# Patient Record
Sex: Female | Born: 1945 | Race: Black or African American | Hispanic: No | Smoking: Never smoker
Health system: Southern US, Community
[De-identification: ages and names within clinical notes are randomized; demographics above are authoritative.]

## PROBLEM LIST (undated history)

## (undated) DIAGNOSIS — Z8739 Personal history of other diseases of the musculoskeletal system and connective tissue: Secondary | ICD-10-CM

## (undated) DIAGNOSIS — H269 Unspecified cataract: Secondary | ICD-10-CM

## (undated) DIAGNOSIS — Z5189 Encounter for other specified aftercare: Secondary | ICD-10-CM

## (undated) DIAGNOSIS — A498 Other bacterial infections of unspecified site: Secondary | ICD-10-CM

## (undated) DIAGNOSIS — R1030 Lower abdominal pain, unspecified: Secondary | ICD-10-CM

## (undated) DIAGNOSIS — I639 Cerebral infarction, unspecified: Secondary | ICD-10-CM

## (undated) DIAGNOSIS — M47818 Spondylosis without myelopathy or radiculopathy, sacral and sacrococcygeal region: Secondary | ICD-10-CM

## (undated) DIAGNOSIS — M76899 Other specified enthesopathies of unspecified lower limb, excluding foot: Secondary | ICD-10-CM

## (undated) DIAGNOSIS — M109 Gout, unspecified: Secondary | ICD-10-CM

## (undated) DIAGNOSIS — E785 Hyperlipidemia, unspecified: Secondary | ICD-10-CM

## (undated) DIAGNOSIS — R351 Nocturia: Secondary | ICD-10-CM

## (undated) DIAGNOSIS — B009 Herpesviral infection, unspecified: Secondary | ICD-10-CM

## (undated) DIAGNOSIS — N183 Chronic kidney disease, stage 3 unspecified: Secondary | ICD-10-CM

## (undated) DIAGNOSIS — I10 Essential (primary) hypertension: Secondary | ICD-10-CM

## (undated) DIAGNOSIS — D649 Anemia, unspecified: Secondary | ICD-10-CM

## (undated) DIAGNOSIS — M1990 Unspecified osteoarthritis, unspecified site: Secondary | ICD-10-CM

## (undated) DIAGNOSIS — E119 Type 2 diabetes mellitus without complications: Secondary | ICD-10-CM

## (undated) DIAGNOSIS — Z8489 Family history of other specified conditions: Secondary | ICD-10-CM

## (undated) DIAGNOSIS — R011 Cardiac murmur, unspecified: Secondary | ICD-10-CM

## (undated) HISTORY — DX: Essential (primary) hypertension: I10

## (undated) HISTORY — DX: Other bacterial infections of unspecified site: A49.8

## (undated) HISTORY — DX: Encounter for other specified aftercare: Z51.89

## (undated) HISTORY — DX: Hyperlipidemia, unspecified: E78.5

## (undated) HISTORY — DX: Anemia, unspecified: D64.9

## (undated) HISTORY — DX: Personal history of other diseases of the musculoskeletal system and connective tissue: Z87.39

## (undated) HISTORY — PX: TUBAL LIGATION: SHX77

## (undated) HISTORY — DX: Cardiac murmur, unspecified: R01.1

## (undated) HISTORY — DX: Cerebral infarction, unspecified: I63.9

## (undated) HISTORY — DX: Unspecified cataract: H26.9

## (undated) HISTORY — DX: Unspecified osteoarthritis, unspecified site: M19.90

---
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
## 1981-12-08 HISTORY — PX: CARPAL TUNNEL RELEASE: SHX101

## 1981-12-08 HISTORY — PX: ABDOMINAL HYSTERECTOMY: SHX81

## 2002-05-30 ENCOUNTER — Encounter: Payer: Self-pay | Admitting: Internal Medicine

## 2004-12-08 DIAGNOSIS — I639 Cerebral infarction, unspecified: Secondary | ICD-10-CM

## 2004-12-08 HISTORY — DX: Cerebral infarction, unspecified: I63.9

## 2005-05-26 ENCOUNTER — Ambulatory Visit: Payer: Self-pay | Admitting: Internal Medicine

## 2005-06-06 ENCOUNTER — Ambulatory Visit: Payer: Self-pay | Admitting: Internal Medicine

## 2005-07-16 ENCOUNTER — Encounter: Admission: RE | Admit: 2005-07-16 | Discharge: 2005-07-16 | Payer: Self-pay | Admitting: Specialist

## 2009-04-03 DIAGNOSIS — D126 Benign neoplasm of colon, unspecified: Secondary | ICD-10-CM | POA: Insufficient documentation

## 2009-04-04 ENCOUNTER — Ambulatory Visit: Payer: Self-pay | Admitting: Internal Medicine

## 2009-04-04 DIAGNOSIS — R143 Flatulence: Secondary | ICD-10-CM

## 2009-04-04 DIAGNOSIS — R198 Other specified symptoms and signs involving the digestive system and abdomen: Secondary | ICD-10-CM | POA: Insufficient documentation

## 2009-04-04 DIAGNOSIS — R141 Gas pain: Secondary | ICD-10-CM | POA: Insufficient documentation

## 2009-04-04 DIAGNOSIS — R142 Eructation: Secondary | ICD-10-CM

## 2009-04-04 DIAGNOSIS — K5900 Constipation, unspecified: Secondary | ICD-10-CM | POA: Insufficient documentation

## 2009-04-11 ENCOUNTER — Encounter: Payer: Self-pay | Admitting: Internal Medicine

## 2009-04-11 ENCOUNTER — Ambulatory Visit: Payer: Self-pay | Admitting: Internal Medicine

## 2009-04-13 ENCOUNTER — Encounter: Payer: Self-pay | Admitting: Internal Medicine

## 2009-06-07 ENCOUNTER — Ambulatory Visit (HOSPITAL_COMMUNITY): Admission: RE | Admit: 2009-06-07 | Discharge: 2009-06-07 | Payer: Self-pay | Admitting: Cardiology

## 2009-08-20 ENCOUNTER — Encounter: Admission: RE | Admit: 2009-08-20 | Discharge: 2009-08-20 | Payer: Self-pay | Admitting: Otolaryngology

## 2010-02-01 ENCOUNTER — Encounter (HOSPITAL_COMMUNITY): Admission: RE | Admit: 2010-02-01 | Discharge: 2010-04-09 | Payer: Self-pay | Admitting: Cardiology

## 2010-02-19 ENCOUNTER — Encounter (INDEPENDENT_AMBULATORY_CARE_PROVIDER_SITE_OTHER): Payer: Self-pay | Admitting: Cardiology

## 2010-02-19 ENCOUNTER — Ambulatory Visit: Admission: RE | Admit: 2010-02-19 | Discharge: 2010-02-19 | Payer: Self-pay | Admitting: Cardiology

## 2010-02-19 ENCOUNTER — Ambulatory Visit (HOSPITAL_COMMUNITY): Admission: RE | Admit: 2010-02-19 | Discharge: 2010-02-19 | Payer: Self-pay | Admitting: Cardiology

## 2010-05-03 ENCOUNTER — Encounter: Admission: RE | Admit: 2010-05-03 | Discharge: 2010-05-03 | Payer: Self-pay | Admitting: Orthopaedic Surgery

## 2010-05-17 ENCOUNTER — Inpatient Hospital Stay (HOSPITAL_COMMUNITY): Admission: EM | Admit: 2010-05-17 | Discharge: 2010-05-23 | Payer: Self-pay | Admitting: Cardiovascular Disease

## 2010-05-17 ENCOUNTER — Ambulatory Visit (HOSPITAL_COMMUNITY): Admission: RE | Admit: 2010-05-17 | Discharge: 2010-05-17 | Payer: Self-pay | Admitting: Cardiology

## 2010-05-19 ENCOUNTER — Encounter (INDEPENDENT_AMBULATORY_CARE_PROVIDER_SITE_OTHER): Payer: Self-pay | Admitting: Cardiology

## 2010-05-21 ENCOUNTER — Ambulatory Visit: Payer: Self-pay | Admitting: Hematology and Oncology

## 2010-05-23 ENCOUNTER — Encounter (INDEPENDENT_AMBULATORY_CARE_PROVIDER_SITE_OTHER): Payer: Self-pay | Admitting: Cardiology

## 2010-05-23 ENCOUNTER — Ambulatory Visit: Payer: Self-pay | Admitting: Hematology and Oncology

## 2010-08-26 ENCOUNTER — Ambulatory Visit: Payer: Self-pay | Admitting: Hematology and Oncology

## 2010-09-11 LAB — BASIC METABOLIC PANEL
BUN: 25 mg/dL — ABNORMAL HIGH (ref 6–23)
Chloride: 100 mEq/L (ref 96–112)
Potassium: 4.3 mEq/L (ref 3.5–5.3)
Sodium: 136 mEq/L (ref 135–145)

## 2010-09-11 LAB — FERRITIN: Ferritin: 15 ng/mL (ref 10–291)

## 2010-09-11 LAB — CBC WITH DIFFERENTIAL/PLATELET
Eosinophils Absolute: 0.2 10*3/uL (ref 0.0–0.5)
MCH: 22.4 pg — ABNORMAL LOW (ref 25.1–34.0)
MCV: 69.9 fL — ABNORMAL LOW (ref 79.5–101.0)
MONO#: 0.6 10*3/uL (ref 0.1–0.9)
MONO%: 6.7 % (ref 0.0–14.0)
NEUT#: 6.8 10*3/uL — ABNORMAL HIGH (ref 1.5–6.5)
Platelets: 373 10*3/uL (ref 145–400)
RBC: 4.73 10*6/uL (ref 3.70–5.45)

## 2010-12-08 DIAGNOSIS — Z5189 Encounter for other specified aftercare: Secondary | ICD-10-CM

## 2010-12-08 HISTORY — DX: Encounter for other specified aftercare: Z51.89

## 2010-12-08 HISTORY — PX: OTHER SURGICAL HISTORY: SHX169

## 2010-12-23 ENCOUNTER — Ambulatory Visit: Payer: Self-pay | Admitting: Hematology and Oncology

## 2010-12-25 LAB — BASIC METABOLIC PANEL
BUN: 18 mg/dL (ref 6–23)
CO2: 25 mEq/L (ref 19–32)
Calcium: 9.5 mg/dL (ref 8.4–10.5)
Chloride: 103 mEq/L (ref 96–112)
Creatinine, Ser: 0.91 mg/dL (ref 0.40–1.20)
Glucose, Bld: 107 mg/dL — ABNORMAL HIGH (ref 70–99)
Potassium: 4.7 mEq/L (ref 3.5–5.3)
Sodium: 138 mEq/L (ref 135–145)

## 2010-12-25 LAB — CBC WITH DIFFERENTIAL/PLATELET
BASO%: 0.8 % (ref 0.0–2.0)
Basophils Absolute: 0.1 10*3/uL (ref 0.0–0.1)
EOS%: 2.1 % (ref 0.0–7.0)
Eosinophils Absolute: 0.2 10*3/uL (ref 0.0–0.5)
HCT: 36.1 % (ref 34.8–46.6)
HGB: 11.3 g/dL — ABNORMAL LOW (ref 11.6–15.9)
LYMPH%: 13.1 % — ABNORMAL LOW (ref 14.0–49.7)
MCH: 23.2 pg — ABNORMAL LOW (ref 25.1–34.0)
MCHC: 31.3 g/dL — ABNORMAL LOW (ref 31.5–36.0)
MCV: 74.1 fL — ABNORMAL LOW (ref 79.5–101.0)
MONO#: 0.5 10*3/uL (ref 0.1–0.9)
MONO%: 6.6 % (ref 0.0–14.0)
NEUT#: 6.2 10*3/uL (ref 1.5–6.5)
NEUT%: 77.4 % — ABNORMAL HIGH (ref 38.4–76.8)
Platelets: 336 10*3/uL (ref 145–400)
RBC: 4.87 10*6/uL (ref 3.70–5.45)
RDW: 18.2 % — ABNORMAL HIGH (ref 11.2–14.5)
WBC: 8 10*3/uL (ref 3.9–10.3)
lymph#: 1.1 10*3/uL (ref 0.9–3.3)

## 2010-12-25 LAB — IRON AND TIBC
%SAT: 11 % — ABNORMAL LOW (ref 20–55)
Iron: 37 ug/dL — ABNORMAL LOW (ref 42–145)
TIBC: 344 ug/dL (ref 250–470)
UIBC: 307 ug/dL

## 2010-12-25 LAB — FERRITIN: Ferritin: 17 ng/mL (ref 10–291)

## 2010-12-29 ENCOUNTER — Encounter: Payer: Self-pay | Admitting: Specialist

## 2011-02-23 LAB — DIFFERENTIAL
Band Neutrophils: 0 % (ref 0–10)
Basophils Relative: 1 % (ref 0–1)
Eosinophils Relative: 2 % (ref 0–5)
Lymphocytes Relative: 9 % — ABNORMAL LOW (ref 12–46)
Lymphs Abs: 0.8 10*3/uL (ref 0.7–4.0)
Monocytes Absolute: 0.3 10*3/uL (ref 0.1–1.0)
Myelocytes: 0 %
Neutro Abs: 7.1 10*3/uL (ref 1.7–7.7)
Neutrophils Relative %: 84 % — ABNORMAL HIGH (ref 43–77)
Promyelocytes Absolute: 0 %

## 2011-02-23 LAB — CBC
HCT: 26.4 % — ABNORMAL LOW (ref 36.0–46.0)
Hemoglobin: 8.3 g/dL — ABNORMAL LOW (ref 12.0–15.0)
Platelets: 288 10*3/uL (ref 150–400)
RDW: 34.1 % — ABNORMAL HIGH (ref 11.5–15.5)
WBC: 8.5 10*3/uL (ref 4.0–10.5)

## 2011-02-23 LAB — PROTEIN ELECTROPH W RFLX QUANT IMMUNOGLOBULINS
Beta 2: 5.6 % (ref 3.2–6.5)
Beta Globulin: 6.1 % (ref 4.7–7.2)
Gamma Globulin: 16.3 % (ref 11.1–18.8)

## 2011-02-23 LAB — HAPTOGLOBIN: Haptoglobin: 293 mg/dL — ABNORMAL HIGH (ref 16–200)

## 2011-02-23 LAB — HEMOGLOBINOPATHY EVALUATION: Hgb S Quant: 0 % (ref 0.0–0.0)

## 2011-02-23 LAB — LACTATE DEHYDROGENASE: LDH: 159 U/L (ref 94–250)

## 2011-02-23 LAB — APTT: aPTT: 35 seconds (ref 24–37)

## 2011-02-23 LAB — IMMUNOFIXATION ELECTROPHORESIS
IgA: 147 mg/dL (ref 68–378)
IgG (Immunoglobin G), Serum: 1080 mg/dL (ref 694–1618)

## 2011-02-23 LAB — IGG, IGA, IGM: IgM, Serum: 53 mg/dL — ABNORMAL LOW (ref 60–263)

## 2011-02-23 LAB — BONE MARROW EXAM

## 2011-02-23 LAB — CHROMOSOME ANALYSIS, BONE MARROW

## 2011-02-24 LAB — BASIC METABOLIC PANEL
BUN: 12 mg/dL (ref 6–23)
BUN: 17 mg/dL (ref 6–23)
CO2: 28 mEq/L (ref 19–32)
Calcium: 8.8 mg/dL (ref 8.4–10.5)
Calcium: 8.9 mg/dL (ref 8.4–10.5)
Creatinine, Ser: 0.97 mg/dL (ref 0.4–1.2)
Creatinine, Ser: 0.99 mg/dL (ref 0.4–1.2)
Creatinine, Ser: 1 mg/dL (ref 0.4–1.2)
GFR calc Af Amer: >60 mL/min (ref >60)
GFR calc Af Amer: >60 mL/min (ref >60)
GFR calc non Af Amer: 55 mL/min — ABNORMAL LOW (ref >60)
GFR calc non Af Amer: 56 mL/min — ABNORMAL LOW (ref >60)
GFR calc non Af Amer: 57 mL/min — ABNORMAL LOW (ref >60)
Glucose, Bld: 120 mg/dL — ABNORMAL HIGH (ref 70–99)
Sodium: 135 mEq/L (ref 135–145)
Sodium: 138 mEq/L (ref 135–145)

## 2011-02-24 LAB — CBC
HCT: 28.5 % — ABNORMAL LOW (ref 36.0–46.0)
Hemoglobin: 7.4 g/dL — ABNORMAL LOW (ref 12.0–15.0)
Hemoglobin: 8.6 g/dL — ABNORMAL LOW (ref 12.0–15.0)
MCHC: 29.9 g/dL — ABNORMAL LOW (ref 30.0–36.0)
MCHC: 30.5 g/dL (ref 30.0–36.0)
MCV: 55.8 fL — ABNORMAL LOW (ref 78.0–100.0)
MCV: 61.7 fL — ABNORMAL LOW (ref 78.0–100.0)
MCV: 61.8 fL — ABNORMAL LOW (ref 78.0–100.0)
Platelets: 321 10*3/uL (ref 150–400)
Platelets: 333 10*3/uL (ref 150–400)
Platelets: 347 10*3/uL (ref 150–400)
Platelets: 377 10*3/uL (ref 150–400)
RBC: 4.04 MIL/uL (ref 3.87–5.11)
RBC: 4.46 MIL/uL (ref 3.87–5.11)
RBC: 5 MIL/uL (ref 3.87–5.11)
RDW: 24.3 % — ABNORMAL HIGH (ref 11.5–15.5)
RDW: 24.8 % — ABNORMAL HIGH (ref 11.5–15.5)
RDW: 33.3 % — ABNORMAL HIGH (ref 11.5–15.5)
WBC: 8.1 10*3/uL (ref 4.0–10.5)
WBC: 8.3 10*3/uL (ref 4.0–10.5)
WBC: 8.7 10*3/uL (ref 4.0–10.5)

## 2011-02-24 LAB — CROSSMATCH: ABO/RH(D): O POS

## 2011-02-24 LAB — HEMOCCULT GUIAC POC 1CARD (OFFICE): Fecal Occult Bld: NEGATIVE

## 2011-02-24 LAB — SEDIMENTATION RATE: Sed Rate: 37 mm/hr — ABNORMAL HIGH (ref 0–22)

## 2011-02-24 LAB — ANA: Anti Nuclear Antibody(ANA): POSITIVE — AB

## 2011-02-24 LAB — IRON AND TIBC
Iron: 242 ug/dL — ABNORMAL HIGH (ref 42–135)
TIBC: 392 ug/dL (ref 250–470)
UIBC: 150 ug/dL

## 2011-02-24 LAB — ANTI-NUCLEAR AB-TITER (ANA TITER): ANA Titer 1: 1:80 {titer} — ABNORMAL HIGH

## 2011-02-24 LAB — RETICULOCYTES: Retic Ct Pct: 0.8 % (ref 0.4–3.1)

## 2011-03-17 LAB — COMPREHENSIVE METABOLIC PANEL
Albumin: 4 g/dL (ref 3.5–5.2)
BUN: 19 mg/dL (ref 6–23)
Calcium: 9.2 mg/dL (ref 8.4–10.5)
Chloride: 103 mEq/L (ref 96–112)
Creatinine, Ser: 0.97 mg/dL (ref 0.4–1.2)
Total Bilirubin: 1 mg/dL (ref 0.3–1.2)

## 2011-03-17 LAB — CBC
Platelets: 349 10*3/uL (ref 150–400)
WBC: 7.7 10*3/uL (ref 4.0–10.5)

## 2011-03-17 LAB — BRAIN NATRIURETIC PEPTIDE: Pro B Natriuretic peptide (BNP): 55 pg/mL (ref 0.0–100.0)

## 2011-03-17 LAB — LIPID PANEL
HDL: 51 mg/dL (ref >39)
Triglycerides: 73 mg/dL (ref <150)
VLDL: 15 mg/dL (ref 0–40)

## 2011-03-17 LAB — URINALYSIS, MICROSCOPIC ONLY
Ketones, ur: NEGATIVE mg/dL
Nitrite: NEGATIVE
Protein, ur: NEGATIVE mg/dL
pH: 7 (ref 5.0–8.0)

## 2011-03-17 LAB — TSH: TSH: 0.881 u[IU]/mL (ref 0.350–4.500)

## 2011-06-05 ENCOUNTER — Other Ambulatory Visit: Payer: Self-pay | Admitting: Hematology and Oncology

## 2011-06-05 ENCOUNTER — Encounter (HOSPITAL_BASED_OUTPATIENT_CLINIC_OR_DEPARTMENT_OTHER): Payer: Medicare Other | Admitting: Hematology and Oncology

## 2011-06-05 DIAGNOSIS — D638 Anemia in other chronic diseases classified elsewhere: Secondary | ICD-10-CM

## 2011-06-05 DIAGNOSIS — R5383 Other fatigue: Secondary | ICD-10-CM

## 2011-06-05 DIAGNOSIS — I10 Essential (primary) hypertension: Secondary | ICD-10-CM

## 2011-06-05 DIAGNOSIS — M25569 Pain in unspecified knee: Secondary | ICD-10-CM

## 2011-06-05 DIAGNOSIS — R700 Elevated erythrocyte sedimentation rate: Secondary | ICD-10-CM

## 2011-06-05 LAB — CBC WITH DIFFERENTIAL/PLATELET
BASO%: 0.5 % (ref 0.0–2.0)
Basophils Absolute: 0 10*3/uL (ref 0.0–0.1)
HCT: 38.3 % (ref 34.8–46.6)
HGB: 12.4 g/dL (ref 11.6–15.9)
MONO#: 0.5 10*3/uL (ref 0.1–0.9)
NEUT%: 74.8 % (ref 38.4–76.8)
WBC: 7.5 10*3/uL (ref 3.9–10.3)
lymph#: 1.1 10*3/uL (ref 0.9–3.3)

## 2011-06-05 LAB — BASIC METABOLIC PANEL
CO2: 24 mEq/L (ref 19–32)
Calcium: 9.4 mg/dL (ref 8.4–10.5)
Chloride: 106 mEq/L (ref 96–112)
Creatinine, Ser: 1 mg/dL (ref 0.50–1.10)
Glucose, Bld: 122 mg/dL — ABNORMAL HIGH (ref 70–99)
Sodium: 140 mEq/L (ref 135–145)

## 2011-06-05 LAB — IRON AND TIBC: %SAT: 13 % — ABNORMAL LOW (ref 20–55)

## 2011-06-10 ENCOUNTER — Encounter (HOSPITAL_BASED_OUTPATIENT_CLINIC_OR_DEPARTMENT_OTHER): Payer: Medicare Other | Admitting: Hematology and Oncology

## 2011-06-10 DIAGNOSIS — D638 Anemia in other chronic diseases classified elsewhere: Secondary | ICD-10-CM

## 2011-06-10 DIAGNOSIS — D649 Anemia, unspecified: Secondary | ICD-10-CM

## 2011-11-29 ENCOUNTER — Telehealth: Payer: Self-pay | Admitting: Hematology and Oncology

## 2011-11-29 NOTE — Telephone Encounter (Signed)
lmonvm adviisng the pt of her r/s appts due to the electronic medical records system

## 2011-12-16 ENCOUNTER — Other Ambulatory Visit: Payer: Medicare Other | Admitting: Lab

## 2011-12-24 ENCOUNTER — Other Ambulatory Visit (HOSPITAL_BASED_OUTPATIENT_CLINIC_OR_DEPARTMENT_OTHER): Payer: Medicare Other | Admitting: Lab

## 2011-12-24 DIAGNOSIS — R700 Elevated erythrocyte sedimentation rate: Secondary | ICD-10-CM

## 2011-12-24 DIAGNOSIS — R5381 Other malaise: Secondary | ICD-10-CM

## 2011-12-24 DIAGNOSIS — D638 Anemia in other chronic diseases classified elsewhere: Secondary | ICD-10-CM

## 2011-12-24 DIAGNOSIS — M25569 Pain in unspecified knee: Secondary | ICD-10-CM

## 2011-12-24 LAB — IRON AND TIBC
%SAT: 13 % — ABNORMAL LOW (ref 20–55)
Iron: 47 ug/dL (ref 42–145)
UIBC: 326 ug/dL (ref 125–400)

## 2011-12-24 LAB — BASIC METABOLIC PANEL
Calcium: 9.5 mg/dL (ref 8.4–10.5)
Potassium: 3.9 mEq/L (ref 3.5–5.3)
Sodium: 138 mEq/L (ref 135–145)

## 2011-12-24 LAB — CBC WITH DIFFERENTIAL/PLATELET
BASO%: 0.6 % (ref 0.0–2.0)
Basophils Absolute: 0.1 10*3/uL (ref 0.0–0.1)
EOS%: 1.9 % (ref 0.0–7.0)
Eosinophils Absolute: 0.2 10*3/uL (ref 0.0–0.5)
HGB: 12.8 g/dL (ref 11.6–15.9)
MCV: 74.5 fL — ABNORMAL LOW (ref 79.5–101.0)
MONO#: 0.5 10*3/uL (ref 0.1–0.9)
NEUT#: 5 10*3/uL (ref 1.5–6.5)
RDW: 17.1 % — ABNORMAL HIGH (ref 11.2–14.5)
WBC: 7.8 10*3/uL (ref 3.9–10.3)

## 2011-12-24 LAB — FERRITIN: Ferritin: 17 ng/mL (ref 10–291)

## 2011-12-29 ENCOUNTER — Encounter: Payer: Self-pay | Admitting: *Deleted

## 2011-12-30 ENCOUNTER — Ambulatory Visit (HOSPITAL_BASED_OUTPATIENT_CLINIC_OR_DEPARTMENT_OTHER): Payer: Medicare Other | Admitting: Hematology and Oncology

## 2011-12-30 ENCOUNTER — Telehealth: Payer: Self-pay | Admitting: Hematology and Oncology

## 2011-12-30 VITALS — BP 214/108 | HR 76 | Temp 97.0°F | Ht 64.0 in | Wt 228.9 lb

## 2011-12-30 DIAGNOSIS — D509 Iron deficiency anemia, unspecified: Secondary | ICD-10-CM

## 2011-12-30 DIAGNOSIS — D539 Nutritional anemia, unspecified: Secondary | ICD-10-CM

## 2011-12-30 DIAGNOSIS — D638 Anemia in other chronic diseases classified elsewhere: Secondary | ICD-10-CM

## 2011-12-30 NOTE — Telephone Encounter (Signed)
appt made for 2/6 iron,09/17/12 lab and 09/22/12 md,aware and printed  aom

## 2011-12-30 NOTE — Progress Notes (Signed)
This office note has been dictated.

## 2011-12-30 NOTE — Progress Notes (Signed)
CC:   Beth [name]. Spruill, M.D. Eduardo Osier. Sharyn Lull, M.D. Erasmo Downer, MD  IDENTIFYING STATEMENT:  Patient is a 66 year old woman with history of iron-deficiency anemia, anemia of chronic disease who presents for followup.  INTERVAL HISTORY:  The patient reports having had profound epistaxis this fall.  She was admitted to [facility].  She tells me she had blood clots in her nostril which required evacuation.  In addition, she also received IV iron.  She continues on oral iron.  Does not seem to constipate her.  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  MEDICATIONS:  Nu-Iron 150 mg daily.  Other medications reviewed and updated.  ALLERGIES:  None.  PHYSICAL EXAM:  Well-appearing, well-nourished woman in no distress. Vitals:  Pulse 76, blood pressure 170/85, temperature 97, respirations 20, weight 228.9 pounds.  HEENT:  Head is atraumatic, normocephalic.  Sclerae anicteric.  Mouth moist.  Chest/CVS: Unremarkable.  Abdomen:  Soft.  Extremities:  No edema.  LAB DATA:  On 12/24/2011, white cell count 7.8, hemoglobin 12.8, hematocrit 40.6, platelets 203, iron 47, TIBC 373, saturation 13% (15%), ferritin 17 (32).  Sodium 138, potassium 3.9, chloride 100, CO2 28, BUN 23, creatinine 1.06, glucose 140, calcium 9.5.  IMPRESSION AND PLAN:  Beth [name] is a 66 year old woman with iron- deficiency anemia, anemia of chronic disease.  Iron stores are a little on the low side but acute incidences of epistaxis.  She is on oral iron. Recommend replenish iron stores with IV iron in the form of Feraheme. She is agreeable to this.  Follows up in 9 months' time.  I would like to point out her blood pressure was elevated and I recommended that she see Dr. Sharyn Lull as soon as possible.    ______________________________ Laurice Record, M.D. LIO/MEDQ  D:  12/30/2011  T:  12/30/2011  Job:  [id number]

## 2012-01-14 ENCOUNTER — Ambulatory Visit (HOSPITAL_BASED_OUTPATIENT_CLINIC_OR_DEPARTMENT_OTHER): Payer: Medicare Other

## 2012-01-14 VITALS — BP 183/94 | HR 56 | Temp 97.5°F

## 2012-01-14 DIAGNOSIS — D509 Iron deficiency anemia, unspecified: Secondary | ICD-10-CM

## 2012-01-14 DIAGNOSIS — D539 Nutritional anemia, unspecified: Secondary | ICD-10-CM

## 2012-01-14 MED ORDER — SODIUM CHLORIDE 0.9 % IV SOLN
1020.0000 mg | Freq: Once | INTRAVENOUS | Status: AC
  Administered 2012-01-14: 1020 mg via INTRAVENOUS
  Filled 2012-01-14: qty 34

## 2012-01-14 MED ORDER — SODIUM CHLORIDE 0.9 % IV SOLN
Freq: Once | INTRAVENOUS | Status: AC
  Administered 2012-01-14: 11:00:00 via INTRAVENOUS

## 2012-03-23 ENCOUNTER — Telehealth: Payer: Self-pay | Admitting: *Deleted

## 2012-03-23 ENCOUNTER — Other Ambulatory Visit: Payer: Self-pay | Admitting: *Deleted

## 2012-03-23 DIAGNOSIS — D539 Nutritional anemia, unspecified: Secondary | ICD-10-CM

## 2012-03-23 MED ORDER — POLYSACCHARIDE IRON 150 MG PO CAPS: 150.0000 mg | ORAL_CAPSULE | Freq: Every day | ORAL | Status: DC

## 2012-03-23 NOTE — Telephone Encounter (Signed)
Spoke with pt and informed pt that refill for Niferex was called in to pt's pharmacy as ok per md.  Pt voiced understanding.

## 2012-03-23 NOTE — Telephone Encounter (Signed)
Pt called requesting refill of iron pill.    Pt's  Phone   [phone].

## 2012-08-25 ENCOUNTER — Telehealth: Payer: Self-pay | Admitting: Hematology and Oncology

## 2012-08-25 NOTE — Telephone Encounter (Signed)
LVM for pt advising on d.t change.....mailed updated schedule .Marland Kitchen..sed

## 2012-08-27 ENCOUNTER — Telehealth: Payer: Self-pay | Admitting: Hematology and Oncology

## 2012-08-27 ENCOUNTER — Other Ambulatory Visit: Payer: Self-pay | Admitting: *Deleted

## 2012-08-27 NOTE — Telephone Encounter (Signed)
Pt called wanting to move October appts to November. Message to LO - pt aware.

## 2012-08-28 ENCOUNTER — Telehealth: Payer: Self-pay | Admitting: Hematology and Oncology

## 2012-08-28 NOTE — Telephone Encounter (Signed)
lmonvm adviisng the pt of her nov 2013 appts °

## 2012-09-17 ENCOUNTER — Other Ambulatory Visit: Payer: Medicare Other

## 2012-09-21 ENCOUNTER — Ambulatory Visit: Payer: Medicare Other | Admitting: Family

## 2012-09-21 ENCOUNTER — Telehealth: Payer: Self-pay | Admitting: Hematology and Oncology

## 2012-09-21 NOTE — Telephone Encounter (Signed)
lm that appt was moved to 11  due to mid-lev meeting

## 2012-09-22 ENCOUNTER — Ambulatory Visit: Payer: Medicare Other | Admitting: Hematology and Oncology

## 2012-10-04 ENCOUNTER — Telehealth: Payer: Self-pay | Admitting: *Deleted

## 2012-10-04 NOTE — Telephone Encounter (Signed)
Received message from pt wanting to reschedule appts.  Spoke with pt and was informed that pt will be going out of town for about 1 week.   Pt is leaving today.   Instructed pt to call office when pt is back in town so rescheduled appts can be made for pt.   Pt voiced understanding. Pt's  Phone     [phone].

## 2012-10-11 ENCOUNTER — Telehealth: Payer: Self-pay | Admitting: *Deleted

## 2012-10-11 ENCOUNTER — Other Ambulatory Visit: Payer: Self-pay | Admitting: *Deleted

## 2012-10-11 DIAGNOSIS — D539 Nutritional anemia, unspecified: Secondary | ICD-10-CM

## 2012-10-11 NOTE — Telephone Encounter (Signed)
Left voice message to inform the patient of the 10-26-2012 lab only appointment 11-02-2012 md appointment

## 2012-10-12 ENCOUNTER — Other Ambulatory Visit: Payer: Medicare Other | Admitting: Lab

## 2012-10-19 ENCOUNTER — Ambulatory Visit: Payer: Medicare Other | Admitting: Family

## 2012-10-26 ENCOUNTER — Other Ambulatory Visit (HOSPITAL_BASED_OUTPATIENT_CLINIC_OR_DEPARTMENT_OTHER): Payer: Medicare Other | Admitting: Lab

## 2012-10-26 DIAGNOSIS — D539 Nutritional anemia, unspecified: Secondary | ICD-10-CM

## 2012-10-26 LAB — CBC WITH DIFFERENTIAL/PLATELET
BASO%: 0.6 % (ref 0.0–2.0)
Basophils Absolute: 0 10*3/uL (ref 0.0–0.1)
EOS%: 2.5 % (ref 0.0–7.0)
Eosinophils Absolute: 0.2 10*3/uL (ref 0.0–0.5)
HCT: 41 % (ref 34.8–46.6)
HGB: 13.3 g/dL (ref 11.6–15.9)
LYMPH%: 21.5 % (ref 14.0–49.7)
MCH: 26.7 pg (ref 25.1–34.0)
MCHC: 32.5 g/dL (ref 31.5–36.0)
MCV: 82.1 fL (ref 79.5–101.0)
MONO#: 0.5 10*3/uL (ref 0.1–0.9)
MONO%: 6.8 % (ref 0.0–14.0)
NEUT#: 5 10*3/uL (ref 1.5–6.5)
NEUT%: 68.6 % (ref 38.4–76.8)
Platelets: 268 10*3/uL (ref 145–400)
RBC: 4.99 10*6/uL (ref 3.70–5.45)
RDW: 14.4 % (ref 11.2–14.5)
WBC: 7.4 10*3/uL (ref 3.9–10.3)
lymph#: 1.6 10*3/uL (ref 0.9–3.3)

## 2012-10-26 LAB — FERRITIN: Ferritin: 324 ng/mL — ABNORMAL HIGH (ref 10–291)

## 2012-10-26 LAB — IRON AND TIBC
%SAT: 24 % (ref 20–55)
Iron: 64 ug/dL (ref 42–145)
TIBC: 267 ug/dL (ref 250–470)
UIBC: 203 ug/dL (ref 125–400)

## 2012-10-26 LAB — BASIC METABOLIC PANEL (CC13)
BUN: 18 mg/dL (ref 7.0–26.0)
CO2: 29 mEq/L (ref 22–29)
Calcium: 10.2 mg/dL (ref 8.4–10.4)
Chloride: 104 mEq/L (ref 98–107)
Creatinine: 1 mg/dL (ref 0.6–1.1)
Glucose: 131 mg/dl — ABNORMAL HIGH (ref 70–99)
Potassium: 4 mEq/L (ref 3.5–5.1)
Sodium: 142 mEq/L (ref 136–145)

## 2012-11-02 ENCOUNTER — Encounter: Payer: Self-pay | Admitting: Hematology and Oncology

## 2012-11-02 ENCOUNTER — Ambulatory Visit (HOSPITAL_BASED_OUTPATIENT_CLINIC_OR_DEPARTMENT_OTHER): Payer: Medicare Other | Admitting: Hematology and Oncology

## 2012-11-02 VITALS — BP 183/107 | HR 73 | Temp 97.2°F | Resp 18 | Ht 64.0 in | Wt 220.3 lb

## 2012-11-02 DIAGNOSIS — D509 Iron deficiency anemia, unspecified: Secondary | ICD-10-CM

## 2012-11-02 DIAGNOSIS — D539 Nutritional anemia, unspecified: Secondary | ICD-10-CM

## 2012-11-02 DIAGNOSIS — D638 Anemia in other chronic diseases classified elsewhere: Secondary | ICD-10-CM

## 2012-11-02 NOTE — Progress Notes (Signed)
This office note has been dictated.

## 2012-11-02 NOTE — Patient Instructions (Addendum)
Beth [name]  [id number]   [facility] - AFTER VISIT SUMMARY   **RECOMMENDATIONS MADE BY THE CONSULTANT AND ANY TEST    RESULTS WILL BE SENT TO YOUR REFERRING DOCTORS.   YOUR EXAM FINDINGS, LABS AND RESULTS WERE DISCUSSED BY YOUR MD TODAY.  YOU CAN GO TO THE [facility] WEB SITE FOR INSTRUCTIONS ON HOW TO ASSESS MY CHART FOR ADDITIONAL INFORMATION AS NEEDED.  Your Updated drug allergies are: Allergies as of 11/02/2012  . (No Known Allergies)    Your current list of medications are: Current Outpatient Prescriptions  Medication Sig Dispense Refill  . amLODipine (NORVASC) 5 MG tablet Take 5 mg by mouth 2 (two) times daily.      Marland Kitchen amLODipine-olmesartan (AZOR) 5-40 MG per tablet Take 1 tablet by mouth daily.      Marland Kitchen aspirin 325 MG EC tablet Take 325 mg by mouth daily.      Marland Kitchen atorvastatin (LIPITOR) 10 MG tablet Take 10 mg by mouth daily.      Marland Kitchen Bioflavonoid Products (BIOFLEX PO) Take 1 tablet by mouth daily.      . COD LIVER OIL PO Take 1 tablet by mouth daily.       . cyclobenzaprine (FLEXERIL) 10 MG tablet Take 10 mg by mouth as needed.       . fish oil-omega-3 fatty acids 1000 MG capsule Take 1 capsule by mouth daily.      . folic acid (FOLVITE) 1 MG tablet Take 1 mg by mouth daily.      . furosemide (LASIX) 80 MG tablet Take 80 mg by mouth daily.      Marland Kitchen labetalol (NORMODYNE) 200 MG tablet Take 400 mg by mouth 2 (two) times daily.      . Multiple Vitamins-Minerals (CENTRUM SILVER PO) Take 1 tablet by mouth every other day.       . nebivolol (BYSTOLIC) 10 MG tablet Take 10 mg by mouth daily.      Marland Kitchen olmesartan (BENICAR) 40 MG tablet Take 40 mg by mouth daily.      . polysaccharide iron (NIFEREX) 150 MG CAPS capsule Take 1 capsule (150 mg total) by mouth daily.  60 each  2  . potassium chloride (K-DUR,KLOR-CON) 10 MEQ tablet Take 10 mEq by mouth daily.         INSTRUCTIONS GIVEN AND DISCUSSED:  See attached schedule   SPECIAL INSTRUCTIONS/FOLLOW-UP:  See above.  I  acknowledge that I have been informed and understand all the instructions given to me and received a copy.I know to contact the clinic, my physician, or go to the emergency Department if any problems should occur.   I do not have any more questions at this time, but understand that I may call the [facility] at [phone] during business hours should I have any further questions or need assistance in obtaining follow-up care.

## 2012-11-03 ENCOUNTER — Telehealth: Payer: Self-pay | Admitting: *Deleted

## 2012-11-03 NOTE — Progress Notes (Signed)
CC:   Beth [name]. Spruill, M.D.  IDENTIFYING STATEMENT:  The a 66 year old woman with anemia who presents for followup.  INTERVAL HISTORY:  The patient reports no concerns.  She was last seen 9 months ago.  She received Feraheme on January 14, 2012.  Takes prescription oral iron.  Has no other concerns.  Has good energy levels. Has not lost weight.  MEDICATIONS:  Reviewed and updated.  ALLERGIES:  None.  PHYSICAL EXAM:  General:  Patient is a well-appearing, well-nourished woman in no distress.  Vitals:  Pulse 73, blood pressure 183/107, temperature 97.2, respirations 18, weight 220 pounds.  HEENT:  Sclerae anicteric.  Mouth moist.  Chest/CVS:  Unremarkable.  Abdomen:  Soft, nontender.  Bowel sounds present.  Extremities:  No calf tenderness.  LAB DATA:  10/26/2012 white cell count 7.4, hemoglobin 13.3, hematocrit 41, platelets 268.  Iron 64, TIBC 267, ferritin 324 (17), saturation 24%.  Sodium 142, potassium 4, chloride 104, CO2 29, BUN 18, creatinine 1, glucose 131.  IMPRESSION AND PLAN:  Beth [name] is a 66 year old woman with iron- deficiency anemia, anemia of chronic disease.  Ferritin stores are more than adequate.  I have asked her to discontinue the prescription iron for the time being.  She is to supplement her iron through dietary means which she is doing very well.  She is doing very well.  Her blood pressure is elevated, but she recalls that she did not take her blood pressure medicines as prescribed this morning.  She will do so when she goes home and she will recheck blood pressure.  If it remains elevated, she will follow up with Dr. Shana Chute.  She follows up in 9 months' time with blood work.    ______________________________ Laurice Record, M.D. LIO/MEDQ  D:  11/02/2012  T:  11/03/2012  Job:  [id number]

## 2012-11-03 NOTE — Telephone Encounter (Signed)
Mailed out calendar to inform the patient of the new date and time on 07-2013

## 2013-01-18 ENCOUNTER — Other Ambulatory Visit: Payer: Self-pay | Admitting: Cardiology

## 2013-01-18 DIAGNOSIS — N189 Chronic kidney disease, unspecified: Secondary | ICD-10-CM

## 2013-01-25 ENCOUNTER — Ambulatory Visit
Admission: RE | Admit: 2013-01-25 | Discharge: 2013-01-25 | Disposition: A | Discharge status: Home / Self Care | Payer: Medicare Other | Source: Ambulatory Visit | Attending: Cardiology | Admitting: Cardiology

## 2013-01-25 DIAGNOSIS — N189 Chronic kidney disease, unspecified: Secondary | ICD-10-CM

## 2013-01-29 ENCOUNTER — Telehealth: Payer: Self-pay | Admitting: Internal Medicine

## 2013-01-29 NOTE — Telephone Encounter (Signed)
S/w the pt regarding the reassigning of her md. The pt refused to see dr Arbutus Ped she stated that dr Kevin Fenton spruill is her md now and she prefers to stick with him. Pt aware to contact us if the need arises.

## 2013-05-17 ENCOUNTER — Other Ambulatory Visit: Payer: Self-pay | Admitting: Hematology and Oncology

## 2013-06-21 ENCOUNTER — Encounter: Payer: Self-pay | Admitting: General Practice

## 2013-06-21 ENCOUNTER — Ambulatory Visit (INDEPENDENT_AMBULATORY_CARE_PROVIDER_SITE_OTHER): Payer: Medicare Other | Admitting: General Practice

## 2013-06-21 VITALS — BP 174/95 | HR 63 | Temp 98.4°F | Ht 65.0 in | Wt 229.0 lb

## 2013-06-21 DIAGNOSIS — I10 Essential (primary) hypertension: Secondary | ICD-10-CM

## 2013-06-21 DIAGNOSIS — Z09 Encounter for follow-up examination after completed treatment for conditions other than malignant neoplasm: Secondary | ICD-10-CM

## 2013-06-21 DIAGNOSIS — Z833 Family history of diabetes mellitus: Secondary | ICD-10-CM

## 2013-06-21 LAB — POCT CBC
Granulocyte percent: 67.8 %G (ref 37–80)
HCT, POC: 40.5 % (ref 37.7–47.9)
Lymph, poc: 2.5 (ref 0.6–3.4)
MCHC: 35.2 g/dL (ref 31.8–35.4)
MPV: 8.1 fL (ref 0–99.8)
POC Granulocyte: 6.1 (ref 2–6.9)
POC LYMPH PERCENT: 27.4 %L (ref 10–50)
Platelet Count, POC: 267 10*3/uL (ref 142–424)
RDW, POC: 13.9 %
WBC: 9 10*3/uL (ref 4.6–10.2)

## 2013-06-21 LAB — POCT GLYCOSYLATED HEMOGLOBIN (HGB A1C): Hemoglobin A1C: 6.1

## 2013-06-21 NOTE — Progress Notes (Signed)
  Subjective:    Patient ID: Beth [name], female    DOB: [birth year], 67 y.o.   MRN: [id number]  HPI Patient presents today for follow up of blood pressure. She reports checking blood pressure twice a day. Blood pressure ranges 130's-160's/59-90. She denies keeping a diary. She denies headaches, dizziness, or blurred vision. She reports taking medications as directed. Reports her last visit with previous PCP (Dr. Shana Chute) was in April 2014.     Review of Systems  Constitutional: Negative for fever and chills.  HENT: Negative for neck pain and neck stiffness.   Respiratory: Negative for cough, chest tightness and shortness of breath.   Cardiovascular: Negative for chest pain and palpitations.  Gastrointestinal: Negative for vomiting, abdominal pain, diarrhea and blood in stool.  Genitourinary: Negative for dysuria, hematuria and difficulty urinating.  Musculoskeletal: Negative for back pain.  Neurological: Negative for dizziness, weakness and headaches.       Objective:   Physical Exam  Constitutional: She is oriented to person, place, and time. She appears well-developed and well-nourished.  HENT:  Head: Normocephalic and atraumatic.  Right Ear: External ear normal.  Left Ear: External ear normal.  Mouth/Throat: Oropharynx is clear and moist.  Eyes: EOM are normal.  Neck: Normal range of motion. Neck supple. No thyromegaly present.  Cardiovascular: Normal rate, regular rhythm and normal heart sounds.   Pulmonary/Chest: Effort normal and breath sounds normal. No respiratory distress. She exhibits no tenderness.  Abdominal: Soft. Bowel sounds are normal. She exhibits no distension. There is no tenderness.  Neurological: She is alert and oriented to person, place, and time.  Skin: Skin is warm and dry.  Psychiatric: She has a normal mood and affect.          Assessment & Plan:  1. Follow-up exam, 3-6 months since previous exam - POCT CBC - COMPLETE METABOLIC PANEL WITH GFR -  NMR Lipoprofile with Lipids  2. Family history of diabetes mellitus - POCT glycosylated hemoglobin (Hb A1C)  3. Essential hypertension, benign -Continue all current medications Labs pending, cmp, lipid panel -requested medical records from Dr. Magda Kiel office in [place] F/u in 1 month  Discussed regular exercise, weight reduction, and healthy eating habits Patient to maintain a blood pressure diary and bring to next visit Patient verbalized understanding -Coralie Keens, FNP-C

## 2013-06-21 NOTE — Patient Instructions (Addendum)

## 2013-06-22 LAB — COMPLETE METABOLIC PANEL WITH GFR
ALT: 12 U/L (ref 0–35)
AST: 23 U/L (ref 0–37)
Albumin: 4.4 g/dL (ref 3.5–5.2)
Alkaline Phosphatase: 102 U/L (ref 39–117)
GFR, Est Non African American: 57 mL/min — ABNORMAL LOW
Potassium: 4.7 mEq/L (ref 3.5–5.3)
Sodium: 136 mEq/L (ref 135–145)
Total Bilirubin: 1 mg/dL (ref 0.3–1.2)
Total Protein: 7.5 g/dL (ref 6.0–8.3)

## 2013-06-22 LAB — NMR LIPOPROFILE WITH LIPIDS
Cholesterol, Total: 159 mg/dL (ref <200)
HDL-C: 51 mg/dL (ref >=40)
Large HDL-P: 8.5 umol/L (ref >=4.8)
Large VLDL-P: 1.6 nmol/L (ref <=2.7)
Triglycerides: 83 mg/dL (ref <150)
VLDL Size: 47.3 nm — ABNORMAL HIGH (ref <=46.6)

## 2013-06-23 ENCOUNTER — Telehealth: Payer: Self-pay | Admitting: *Deleted

## 2013-06-23 NOTE — Telephone Encounter (Signed)
Message copied by Magdalene River on Thu Jun 23, 2013  8:57 AM ------      Message from: Carl Best, [place] E      Created: Wed Jun 22, 2013  2:27 PM       Please inform that most labs look great. LDL slightly elevated, but continue the healthy eating habits and the exercise we discussed to help lower. thx ------

## 2013-06-23 NOTE — Telephone Encounter (Signed)
Pt called about labs  

## 2013-06-27 ENCOUNTER — Ambulatory Visit: Payer: Self-pay | Admitting: Family Medicine

## 2013-08-03 ENCOUNTER — Ambulatory Visit: Payer: Medicare Other | Admitting: Internal Medicine

## 2013-08-03 ENCOUNTER — Other Ambulatory Visit: Payer: Medicare Other | Admitting: Lab

## 2013-08-03 ENCOUNTER — Ambulatory Visit: Payer: Medicare Other | Admitting: Hematology and Oncology

## 2013-08-22 ENCOUNTER — Telehealth: Payer: Self-pay | Admitting: General Practice

## 2013-08-24 NOTE — Telephone Encounter (Signed)
Patient aware.

## 2013-08-24 NOTE — Telephone Encounter (Signed)
What does she need?

## 2013-09-19 ENCOUNTER — Telehealth: Payer: Self-pay | Admitting: General Practice

## 2013-09-19 ENCOUNTER — Other Ambulatory Visit: Payer: Self-pay | Admitting: General Practice

## 2013-09-19 NOTE — Telephone Encounter (Signed)
Please call her, she needs medication refill

## 2013-09-20 ENCOUNTER — Other Ambulatory Visit: Payer: Self-pay | Admitting: General Practice

## 2013-09-22 ENCOUNTER — Ambulatory Visit (INDEPENDENT_AMBULATORY_CARE_PROVIDER_SITE_OTHER): Payer: Medicare Other | Admitting: General Practice

## 2013-09-22 ENCOUNTER — Encounter: Payer: Self-pay | Admitting: General Practice

## 2013-09-22 ENCOUNTER — Encounter (INDEPENDENT_AMBULATORY_CARE_PROVIDER_SITE_OTHER): Payer: Self-pay

## 2013-09-22 VITALS — BP 192/96 | HR 79 | Temp 97.8°F | Ht 65.0 in | Wt 233.5 lb

## 2013-09-22 DIAGNOSIS — D239 Other benign neoplasm of skin, unspecified: Secondary | ICD-10-CM

## 2013-09-22 DIAGNOSIS — D229 Melanocytic nevi, unspecified: Secondary | ICD-10-CM

## 2013-09-22 DIAGNOSIS — E785 Hyperlipidemia, unspecified: Secondary | ICD-10-CM

## 2013-09-22 DIAGNOSIS — I10 Essential (primary) hypertension: Secondary | ICD-10-CM

## 2013-09-22 MED ORDER — ATORVASTATIN CALCIUM 10 MG PO TABS: 20.0000 mg | ORAL_TABLET | Freq: Every day | ORAL | Status: DC

## 2013-09-22 MED ORDER — POTASSIUM CHLORIDE CRYS ER 10 MEQ PO TBCR: 20.0000 meq | EXTENDED_RELEASE_TABLET | Freq: Every day | ORAL | Status: DC

## 2013-09-22 MED ORDER — NEBIVOLOL HCL 10 MG PO TABS: 10.0000 mg | ORAL_TABLET | Freq: Every day | ORAL | Status: DC

## 2013-09-22 MED ORDER — FUROSEMIDE 80 MG PO TABS: 80.0000 mg | ORAL_TABLET | Freq: Every day | ORAL | Status: DC

## 2013-09-22 MED ORDER — OLMESARTAN MEDOXOMIL-HCTZ 40-12.5 MG PO TABS: 1.0000 | ORAL_TABLET | Freq: Every day | ORAL | Status: DC

## 2013-09-22 NOTE — Patient Instructions (Signed)

## 2013-09-22 NOTE — Progress Notes (Signed)
  Subjective:    Patient ID: Beth [name], female    DOB: 06/04/1946, 67 y.o.   MRN: [id number]  HPI Patient presents today for three month follow up of chronic health conditions. She has a history of hypertension and hyperlipidemia. She reports taking medications as prescribed. Reports healthy eating, baked/low fat foods. She also exercising as tolerated with painful left knee.    Review of Systems  Constitutional: Negative for fever and chills.  Respiratory: Negative for cough, chest tightness, shortness of breath and wheezing.   Cardiovascular: Negative for chest pain and palpitations.  Gastrointestinal: Negative for abdominal pain, diarrhea, constipation and blood in stool.  Genitourinary: Negative for dysuria, hematuria and difficulty urinating.  Musculoskeletal: Negative for back pain, neck pain and neck stiffness.  Neurological: Negative for dizziness, weakness and headaches.       Objective:   Physical Exam  Constitutional: She is oriented to person, place, and time. She appears well-developed and well-nourished.  HENT:  Head: Normocephalic and atraumatic.  Right Ear: External ear normal.  Left Ear: External ear normal.  Nose: Nose normal.  Mouth/Throat: Oropharynx is clear and moist.  Eyes: EOM are normal. Pupils are equal, round, and reactive to light.  Neck: Normal range of motion. Neck supple. No thyromegaly present.  Cardiovascular: Normal rate, regular rhythm and normal heart sounds.   Pulmonary/Chest: Effort normal and breath sounds normal. No respiratory distress. She exhibits no tenderness.  Abdominal: Soft. Bowel sounds are normal. She exhibits no distension. There is no tenderness.  Musculoskeletal: She exhibits no edema and no tenderness.  Lymphadenopathy:    She has no cervical adenopathy.  Neurological: She is alert and oriented to person, place, and time.  Skin: Skin is warm and dry.  Pin point size mole to left clavicle area that is firm, darker than skin  tone, and tender. Negative for drainage  Psychiatric: She has a normal mood and affect.          Assessment & Plan:  1. Hypertension  - CMP14+EGFR - potassium chloride (K-DUR,KLOR-CON) 10 MEQ tablet; Take 2 tablets (20 mEq total) by mouth daily.  Dispense: 60 tablet; Refill: 3 - furosemide (LASIX) 80 MG tablet; Take 1 tablet (80 mg total) by mouth daily.  Dispense: 30 tablet; Refill: 3 - nebivolol (BYSTOLIC) 10 MG tablet; Take 1 tablet (10 mg total) by mouth daily. Take 2 tabs every day  Dispense: 60 tablet; Refill: 3 - olmesartan-hydrochlorothiazide (BENICAR HCT) 40-12.5 MG per tablet; Take 1 tablet by mouth daily.  Dispense: 30 tablet; Refill: 3  2. Hyperlipidemia  - NMR, lipoprofile - atorvastatin (LIPITOR) 10 MG tablet; Take 2 tablets (20 mg total) by mouth daily.  Dispense: 60 tablet; Refill: 3  3. Change in skin mole  - Ambulatory referral to Dermatology -Continue all current medications Labs pending F/u in 3 months Discussed exercise and diet  Patient verbalized understanding Coralie Keens, FNP-C

## 2013-09-23 NOTE — Telephone Encounter (Signed)
Patient had appointment, done

## 2013-09-24 LAB — CMP14+EGFR
ALT: 16 IU/L (ref 0–32)
Albumin: 4.5 g/dL (ref 3.6–4.8)
BUN: 19 mg/dL (ref 8–27)
Calcium: 9.7 mg/dL (ref 8.6–10.2)
Chloride: 98 mmol/L (ref 97–108)
GFR calc Af Amer: 79 mL/min/{1.73_m2} (ref >59)
GFR calc non Af Amer: 68 mL/min/{1.73_m2} (ref >59)
Glucose: 94 mg/dL (ref 65–99)
Total Bilirubin: 0.7 mg/dL (ref 0.0–1.2)
Total Protein: 7.1 g/dL (ref 6.0–8.5)

## 2013-09-24 LAB — NMR, LIPOPROFILE
Cholesterol: 177 mg/dL (ref <200)
HDL Cholesterol by NMR: 56 mg/dL (ref >=40)
LDL Particle Number: 1592 nmol/L — ABNORMAL HIGH (ref <1000)
LDL Size: 20.7 nm (ref >20.5)
LDLC SERPL CALC-MCNC: 103 mg/dL — ABNORMAL HIGH (ref <100)
Triglycerides by NMR: 91 mg/dL (ref <150)

## 2013-09-27 ENCOUNTER — Other Ambulatory Visit: Payer: Self-pay | Admitting: General Practice

## 2013-09-27 ENCOUNTER — Telehealth: Payer: Self-pay | Admitting: General Practice

## 2013-09-27 DIAGNOSIS — I10 Essential (primary) hypertension: Secondary | ICD-10-CM

## 2013-09-27 MED ORDER — NEBIVOLOL HCL 10 MG PO TABS: ORAL_TABLET | ORAL | Status: DC

## 2013-09-27 NOTE — Telephone Encounter (Signed)
Done

## 2013-12-26 ENCOUNTER — Ambulatory Visit: Payer: Medicare Other | Admitting: General Practice

## 2013-12-30 ENCOUNTER — Ambulatory Visit (INDEPENDENT_AMBULATORY_CARE_PROVIDER_SITE_OTHER): Payer: Medicare Other | Admitting: General Practice

## 2013-12-30 ENCOUNTER — Encounter: Payer: Self-pay | Admitting: General Practice

## 2013-12-30 VITALS — BP 194/105 | HR 78 | Temp 96.5°F | Ht 65.0 in | Wt 235.5 lb

## 2013-12-30 DIAGNOSIS — E876 Hypokalemia: Secondary | ICD-10-CM

## 2013-12-30 DIAGNOSIS — E785 Hyperlipidemia, unspecified: Secondary | ICD-10-CM

## 2013-12-30 DIAGNOSIS — I10 Essential (primary) hypertension: Secondary | ICD-10-CM

## 2013-12-30 NOTE — Patient Instructions (Signed)

## 2013-12-30 NOTE — Progress Notes (Signed)
   Subjective:    Patient ID: Beth [name], female    DOB: [birth year], 68 y.o.   MRN: [id number]  HPI Patient presents today for three month follow up of chronic health conditions. History of hypertension and hyperlipidemia, taking medications as prescribed. Reports healthy eating, baked/low fat foods.     Review of Systems  Constitutional: Negative for fever and chills.  Respiratory: Negative for cough, chest tightness, shortness of breath and wheezing.   Cardiovascular: Negative for chest pain and palpitations.  Gastrointestinal: Negative for abdominal pain, diarrhea, constipation and blood in stool.  Genitourinary: Negative for dysuria, hematuria and difficulty urinating.  Musculoskeletal: Negative for back pain, neck pain and neck stiffness.  Neurological: Negative for dizziness, weakness and headaches.       Objective:   Physical Exam  Constitutional: She is oriented to person, place, and time. She appears well-developed and well-nourished.  HENT:  Head: Normocephalic and atraumatic.  Right Ear: External ear normal.  Left Ear: External ear normal.  Nose: Nose normal.  Mouth/Throat: Oropharynx is clear and moist.  Eyes: EOM are normal. Pupils are equal, round, and reactive to light.  Neck: Normal range of motion. Neck supple. No thyromegaly present.  Cardiovascular: Normal rate, regular rhythm and normal heart sounds.   Pulmonary/Chest: Effort normal and breath sounds normal. No respiratory distress. She exhibits no tenderness.  Abdominal: Soft. Bowel sounds are normal. She exhibits no distension. There is no tenderness.  Musculoskeletal: She exhibits no edema and no tenderness.  Lymphadenopathy:    She has no cervical adenopathy.  Neurological: She is alert and oriented to person, place, and time.  Skin: Skin is warm and dry.  Psychiatric: She has a normal mood and affect.          Assessment & Plan:   1. Hypertension  - CMP14+EGFR - nebivolol (BYSTOLIC) 10 MG  tablet; Take 1 tablet twice daily  Dispense: 60 tablet; Refill: 3 - furosemide (LASIX) 80 MG tablet; Take 1 tablet (80 mg total) by mouth daily.  Dispense: 30 tablet; Refill: 3 - olmesartan-hydrochlorothiazide (BENICAR HCT) 40-12.5 MG per tablet; Take 1 tablet by mouth daily.  Dispense: 30 tablet; Refill: 3  2. HLD (hyperlipidemia)  - Lipid panel  3. Hyperlipidemia  - atorvastatin (LIPITOR) 10 MG tablet; Take 2 tablets (20 mg total) by mouth daily.  Dispense: 60 tablet; Refill: 3  4. Hypokalemia  - potassium chloride (K-DUR,KLOR-CON) 10 MEQ tablet; Take 2 tablets (20 mEq total) by mouth daily.  Dispense: 60 tablet; Refill: 3   Continue all current medications Labs pending F/u in 3 months Discussed benefits of regular exercise and healthy eating Patient verbalized understanding Erby Pian, FNP-C

## 2013-12-31 LAB — CMP14+EGFR
ALT: 14 IU/L (ref 0–32)
AST: 17 IU/L (ref 0–40)
Albumin/Globulin Ratio: 1.5 (ref 1.1–2.5)
Albumin: 4.4 g/dL (ref 3.5–4.8)
Alkaline Phosphatase: 114 IU/L (ref 39–117)
BUN / CREAT RATIO: 20 (ref 11–26)
BUN: 18 mg/dL (ref 8–27)
CALCIUM: 9.9 mg/dL (ref 8.7–10.3)
CO2: 28 mmol/L (ref 18–29)
CREATININE: 0.88 mg/dL (ref 0.57–1.00)
Chloride: 96 mmol/L — ABNORMAL LOW (ref 97–108)
GFR calc Af Amer: 77 mL/min/{1.73_m2} (ref >59)
GFR, EST NON AFRICAN AMERICAN: 67 mL/min/{1.73_m2} (ref >59)
GLOBULIN, TOTAL: 2.9 g/dL (ref 1.5–4.5)
Glucose: 114 mg/dL — ABNORMAL HIGH (ref 65–99)
Potassium: 4.3 mmol/L (ref 3.5–5.2)
Sodium: 140 mmol/L (ref 134–144)
Total Bilirubin: 0.9 mg/dL (ref 0.0–1.2)
Total Protein: 7.3 g/dL (ref 6.0–8.5)

## 2013-12-31 LAB — LIPID PANEL
Chol/HDL Ratio: 2.6 ratio units (ref 0.0–4.4)
Cholesterol, Total: 175 mg/dL (ref 100–199)
HDL: 67 mg/dL (ref >39)
LDL CALC: 95 mg/dL (ref 0–99)
TRIGLYCERIDES: 67 mg/dL (ref 0–149)
VLDL Cholesterol Cal: 13 mg/dL (ref 5–40)

## 2014-01-04 DIAGNOSIS — E876 Hypokalemia: Secondary | ICD-10-CM | POA: Insufficient documentation

## 2014-01-04 DIAGNOSIS — I10 Essential (primary) hypertension: Secondary | ICD-10-CM | POA: Insufficient documentation

## 2014-01-04 DIAGNOSIS — E785 Hyperlipidemia, unspecified: Secondary | ICD-10-CM | POA: Insufficient documentation

## 2014-01-04 MED ORDER — NEBIVOLOL HCL 10 MG PO TABS: ORAL_TABLET | ORAL | Status: DC

## 2014-01-04 MED ORDER — OLMESARTAN MEDOXOMIL-HCTZ 40-12.5 MG PO TABS: 1.0000 | ORAL_TABLET | Freq: Every day | ORAL | Status: DC

## 2014-01-04 MED ORDER — ATORVASTATIN CALCIUM 10 MG PO TABS: 20.0000 mg | ORAL_TABLET | Freq: Every day | ORAL | Status: DC

## 2014-01-04 MED ORDER — POTASSIUM CHLORIDE CRYS ER 10 MEQ PO TBCR: 20.0000 meq | EXTENDED_RELEASE_TABLET | Freq: Every day | ORAL | Status: DC

## 2014-01-04 MED ORDER — FUROSEMIDE 80 MG PO TABS: 80.0000 mg | ORAL_TABLET | Freq: Every day | ORAL | Status: DC

## 2014-02-21 ENCOUNTER — Telehealth: Payer: Self-pay | Admitting: General Practice

## 2014-02-21 DIAGNOSIS — E785 Hyperlipidemia, unspecified: Secondary | ICD-10-CM

## 2014-02-21 NOTE — Telephone Encounter (Signed)
Patient requesting refills on atorvastatin 20 (we have 10mg  , take 2), and amlodipine 10mg ,   (not on her list).

## 2014-02-24 MED ORDER — AMLODIPINE BESYLATE 10 MG PO TABS: 10.0000 mg | ORAL_TABLET | Freq: Every day | ORAL | Status: DC

## 2014-02-24 MED ORDER — ATORVASTATIN CALCIUM 10 MG PO TABS: 20.0000 mg | ORAL_TABLET | Freq: Every day | ORAL | Status: DC

## 2014-02-24 NOTE — Telephone Encounter (Signed)
rx sent to pharmacy

## 2014-02-27 ENCOUNTER — Other Ambulatory Visit: Payer: Self-pay | Admitting: General Practice

## 2014-02-28 ENCOUNTER — Telehealth: Payer: Self-pay | Admitting: General Practice

## 2014-03-06 NOTE — Telephone Encounter (Signed)
Mae, I talked with Dionna and she said she had no idea why her Dr. Had put her on atorvastatin bid but she would be glad to take it once a day if you thought it OK.  I called the ins co and asked them if  We put the 10mg  together to make 20 mg in all would they cover it and they said without even a prior authorization.  So can you fix this and I will call Mishika and tell her.  Thanks

## 2014-03-08 ENCOUNTER — Other Ambulatory Visit: Payer: Self-pay | Admitting: General Practice

## 2014-03-08 DIAGNOSIS — E785 Hyperlipidemia, unspecified: Secondary | ICD-10-CM

## 2014-03-08 MED ORDER — ATORVASTATIN CALCIUM 20 MG PO TABS: 20.0000 mg | ORAL_TABLET | Freq: Every day | ORAL | Status: DC

## 2014-03-08 NOTE — Telephone Encounter (Signed)
Done

## 2014-03-16 ENCOUNTER — Encounter: Payer: Self-pay | Admitting: Internal Medicine

## 2014-03-23 ENCOUNTER — Telehealth: Payer: Self-pay | Admitting: General Practice

## 2014-03-23 NOTE — Telephone Encounter (Signed)
appt scheduled for 4/24 with mae for surgical clearance

## 2014-03-31 ENCOUNTER — Ambulatory Visit (INDEPENDENT_AMBULATORY_CARE_PROVIDER_SITE_OTHER): Payer: Medicare Other

## 2014-03-31 ENCOUNTER — Ambulatory Visit (INDEPENDENT_AMBULATORY_CARE_PROVIDER_SITE_OTHER): Payer: Medicare Other | Admitting: General Practice

## 2014-03-31 ENCOUNTER — Encounter: Payer: Self-pay | Admitting: General Practice

## 2014-03-31 VITALS — BP 154/93 | HR 72 | Temp 96.7°F | Resp 18 | Wt 237.0 lb

## 2014-03-31 DIAGNOSIS — Z01818 Encounter for other preprocedural examination: Secondary | ICD-10-CM

## 2014-03-31 DIAGNOSIS — E876 Hypokalemia: Secondary | ICD-10-CM

## 2014-03-31 DIAGNOSIS — I10 Essential (primary) hypertension: Secondary | ICD-10-CM

## 2014-03-31 DIAGNOSIS — E785 Hyperlipidemia, unspecified: Secondary | ICD-10-CM

## 2014-03-31 LAB — POCT CBC
GRANULOCYTE PERCENT: 80.1 % — AB (ref 37–80)
HCT, POC: 42 % (ref 37.7–47.9)
Hemoglobin: 12.8 g/dL (ref 12.2–16.2)
LYMPH, POC: 2 (ref 0.6–3.4)
MCH, POC: 24.5 pg — AB (ref 27–31.2)
MCHC: 30.6 g/dL — AB (ref 31.8–35.4)
MCV: 80.2 fL (ref 80–97)
MPV: 8.1 fL (ref 0–99.8)
PLATELET COUNT, POC: 362 10*3/uL (ref 142–424)
POC Granulocyte: 9.4 — AB (ref 2–6.9)
POC LYMPH %: 16.8 % (ref 10–50)
RBC: 5.2 M/uL (ref 4.04–5.48)
RDW, POC: 14.2 %
WBC: 11.7 10*3/uL — AB (ref 4.6–10.2)

## 2014-03-31 NOTE — Progress Notes (Signed)
   Subjective:    Patient ID: Beth [name], female    DOB: 04-17-1946, 68 y.o.   MRN: [id number]  HPI Patient presents today for three month follow up of chronic health conditions and surgical clearance. History of hypertension, hypokalemia, and hyperlipidemia, taking medications as prescribed. Reports healthy eating healthy. Reports she is having total knee replacement in July.       Review of Systems  Constitutional: Negative for fever and chills.  Respiratory: Negative for cough, chest tightness, shortness of breath and wheezing.   Cardiovascular: Negative for chest pain and palpitations.  Gastrointestinal: Negative for abdominal pain, diarrhea, constipation and blood in stool.  Genitourinary: Negative for dysuria, hematuria and difficulty urinating.  Musculoskeletal: Negative for back pain, neck pain and neck stiffness.       Right knee pain  Neurological: Negative for dizziness, weakness and headaches.       Objective:   Physical Exam  Constitutional: She is oriented to person, place, and time. She appears well-developed and well-nourished.  HENT:  Head: Normocephalic and atraumatic.  Right Ear: External ear normal.  Left Ear: External ear normal.  Nose: Nose normal.  Mouth/Throat: Oropharynx is clear and moist.  Eyes: EOM are normal. Pupils are equal, round, and reactive to light.  Neck: Normal range of motion. Neck supple. No thyromegaly present.  Cardiovascular: Normal rate, regular rhythm and normal heart sounds.   Pulmonary/Chest: Effort normal and breath sounds normal. No respiratory distress. She exhibits no tenderness.  Abdominal: Soft. Bowel sounds are normal. She exhibits no distension. There is no tenderness.  Musculoskeletal: She exhibits tenderness. She exhibits no edema.  Limited range of motion right knee  Lymphadenopathy:    She has no cervical adenopathy.  Neurological: She is alert and oriented to person, place, and time.  Skin: Skin is warm and dry.    Psychiatric: She has a normal mood and affect.    [facility] reading (PRIMARY) by Erby Pian, FNP-C, no acute process noted on chest xray.      Assessment & Plan:  1. Pre-op testing  - DG Chest 2 View; Future - EKG 12-Lead  2. Hyperlipidemia  - Lipid panel - atorvastatin (LIPITOR) 20 MG tablet; Take 1 tablet (20 mg total) by mouth daily.  Dispense: 30 tablet; Refill: 5  3. Hypertension  - CMP14+EGFR - olmesartan-hydrochlorothiazide (BENICAR HCT) 40-12.5 MG per tablet; Take 1 tablet by mouth daily.  Dispense: 30 tablet; Refill: 3 - nebivolol (BYSTOLIC) 10 MG tablet; Take 1 tablet twice daily  Dispense: 60 tablet; Refill: 3 - furosemide (LASIX) 80 MG tablet; Take 1 tablet (80 mg total) by mouth daily.  Dispense: 30 tablet; Refill: 3  4. Preoperative clearance  - POCT CBC - Ambulatory referral to Cardiology  5. Hypokalemia  - potassium chloride (K-DUR,KLOR-CON) 10 MEQ tablet; Take 2 tablets (20 mEq total) by mouth daily.  Dispense: 60 tablet; Refill: 3 --Patient's surgical clearance is pending cardiology evaluation -Continue all current medications Labs pending F/u in 3 months Discussed benefits of healthy eating Patient verbalized understanding Erby Pian, FNP-C

## 2014-04-01 LAB — LIPID PANEL
CHOL/HDL RATIO: 2.7 ratio (ref 0.0–4.4)
Cholesterol, Total: 165 mg/dL (ref 100–199)
HDL: 62 mg/dL (ref >39)
LDL Calculated: 88 mg/dL (ref 0–99)
TRIGLYCERIDES: 75 mg/dL (ref 0–149)
VLDL Cholesterol Cal: 15 mg/dL (ref 5–40)

## 2014-04-01 LAB — CMP14+EGFR
ALT: 15 IU/L (ref 0–32)
AST: 17 IU/L (ref 0–40)
Albumin/Globulin Ratio: 1.5 (ref 1.1–2.5)
Albumin: 4.3 g/dL (ref 3.5–4.8)
Alkaline Phosphatase: 114 IU/L (ref 39–117)
BUN/Creatinine Ratio: 19 (ref 11–26)
BUN: 17 mg/dL (ref 8–27)
CO2: 29 mmol/L (ref 18–29)
Calcium: 9.7 mg/dL (ref 8.7–10.3)
Chloride: 98 mmol/L (ref 97–108)
Creatinine, Ser: 0.88 mg/dL (ref 0.57–1.00)
GFR calc Af Amer: 77 mL/min/{1.73_m2} (ref >59)
GFR calc non Af Amer: 67 mL/min/{1.73_m2} (ref >59)
GLUCOSE: 123 mg/dL — AB (ref 65–99)
Globulin, Total: 2.9 g/dL (ref 1.5–4.5)
Potassium: 4.5 mmol/L (ref 3.5–5.2)
Sodium: 142 mmol/L (ref 134–144)
TOTAL PROTEIN: 7.2 g/dL (ref 6.0–8.5)
Total Bilirubin: 1.1 mg/dL (ref 0.0–1.2)

## 2014-04-01 MED ORDER — FUROSEMIDE 80 MG PO TABS: 80.0000 mg | ORAL_TABLET | Freq: Every day | ORAL | Status: DC

## 2014-04-01 MED ORDER — NEBIVOLOL HCL 10 MG PO TABS: ORAL_TABLET | ORAL | Status: DC

## 2014-04-01 MED ORDER — OLMESARTAN MEDOXOMIL-HCTZ 40-12.5 MG PO TABS: 1.0000 | ORAL_TABLET | Freq: Every day | ORAL | Status: DC

## 2014-04-01 MED ORDER — ATORVASTATIN CALCIUM 20 MG PO TABS: 20.0000 mg | ORAL_TABLET | Freq: Every day | ORAL | Status: DC

## 2014-04-01 MED ORDER — POTASSIUM CHLORIDE CRYS ER 10 MEQ PO TBCR: 20.0000 meq | EXTENDED_RELEASE_TABLET | Freq: Every day | ORAL | Status: DC

## 2014-04-01 NOTE — Patient Instructions (Signed)

## 2014-04-07 ENCOUNTER — Telehealth: Payer: Self-pay | Admitting: *Deleted

## 2014-04-07 NOTE — Telephone Encounter (Signed)
Aware of results. Needs CBC in 2 weeks. Pt will schedule with lab.

## 2014-04-07 NOTE — Telephone Encounter (Signed)
Message copied by Shelbie Ammons on Fri Apr 07, 2014  4:37 PM ------      Message from: Erby Pian      Created: Fri Apr 07, 2014 10:44 AM       Please inform that most labs are wnl. CBC slightly abnormal, wbc elevated. Please make an appointment to have CBC redrawn in 1-2 weeks, this is for surgical clearance. ------

## 2014-04-11 ENCOUNTER — Telehealth: Payer: Self-pay | Admitting: General Practice

## 2014-04-11 MED ORDER — AMLODIPINE BESYLATE 10 MG PO TABS: 10.0000 mg | ORAL_TABLET | Freq: Every day | ORAL | Status: DC

## 2014-04-11 NOTE — Telephone Encounter (Signed)
Script sent electronically 

## 2014-04-25 ENCOUNTER — Other Ambulatory Visit: Payer: Medicare Other

## 2014-05-03 ENCOUNTER — Other Ambulatory Visit (INDEPENDENT_AMBULATORY_CARE_PROVIDER_SITE_OTHER): Payer: Medicare Other

## 2014-05-03 DIAGNOSIS — R7989 Other specified abnormal findings of blood chemistry: Secondary | ICD-10-CM

## 2014-05-03 LAB — POCT CBC
Granulocyte percent: 71.5 %G (ref 37–80)
HCT, POC: 39.7 % (ref 37.7–47.9)
Hemoglobin: 12.4 g/dL (ref 12.2–16.2)
Lymph, poc: 2.4 (ref 0.6–3.4)
MCH: 25.4 pg — AB (ref 27–31.2)
MCHC: 31.4 g/dL — AB (ref 31.8–35.4)
MCV: 81 fL (ref 80–97)
MPV: 8.7 fL (ref 0–99.8)
PLATELET COUNT, POC: 311 10*3/uL (ref 142–424)
POC GRANULOCYTE: 6.7 (ref 2–6.9)
POC LYMPH %: 25.4 % (ref 10–50)
RBC: 4.9 M/uL (ref 4.04–5.48)
RDW, POC: 14.6 %
WBC: 9.4 10*3/uL (ref 4.6–10.2)

## 2014-05-09 ENCOUNTER — Encounter: Payer: Self-pay | Admitting: Internal Medicine

## 2014-05-23 ENCOUNTER — Encounter: Payer: Self-pay | Admitting: *Deleted

## 2014-05-31 ENCOUNTER — Encounter: Payer: Self-pay | Admitting: Cardiology

## 2014-05-31 ENCOUNTER — Ambulatory Visit (INDEPENDENT_AMBULATORY_CARE_PROVIDER_SITE_OTHER): Payer: Medicare Other | Admitting: Cardiology

## 2014-05-31 VITALS — BP 183/102 | HR 69 | Ht 65.0 in | Wt 228.0 lb

## 2014-05-31 DIAGNOSIS — Z01810 Encounter for preprocedural cardiovascular examination: Secondary | ICD-10-CM | POA: Insufficient documentation

## 2014-05-31 DIAGNOSIS — I10 Essential (primary) hypertension: Secondary | ICD-10-CM

## 2014-05-31 MED ORDER — OLMESARTAN MEDOXOMIL-HCTZ 40-25 MG PO TABS: 1.0000 | ORAL_TABLET | Freq: Every day | ORAL | Status: DC

## 2014-05-31 NOTE — Progress Notes (Signed)
HPI The patient presents for preoperative evaluation the right knee surgery. She has no past cardiac history but she does have cardiovascular risk factors. Because of this she is referred for evaluation. She does take her trash out which is several feet up an incline. This is the most concerning thing she does. She does her household chores. With this she denies any cardiovascular symptoms. She does not have chest pressure, neck or arm discomfort. She does not have palpitations, presyncope or syncope. She does not have PND or orthopnea.  Of note she did report a CVA in 2006 some mild right-sided weakness but I have no details of this. She did have a stress perfusion study in 2011. I saw this in the hospital records. She had no evidence of intra-or infarct. The EF was 66%.  Allergies  Allergen Reactions  . Clonidine Derivatives Other (See Comments)    Dizziness and weakness    Current Outpatient Prescriptions  Medication Sig Dispense Refill  . amLODipine (NORVASC) 10 MG tablet Take 1 tablet (10 mg total) by mouth daily.  30 tablet  3  . atorvastatin (LIPITOR) 20 MG tablet Take 1 tablet (20 mg total) by mouth daily.  30 tablet  5  . Cholecalciferol (VITAMIN D-3 PO) Take 250 mg by mouth.      . COD LIVER OIL PO Take 530 mg by mouth daily.       . fish oil-omega-3 fatty acids 1000 MG capsule Take 300 mg by mouth daily.       . furosemide (LASIX) 80 MG tablet Take 80 mg by mouth 2 (two) times daily.      . Multiple Vitamins-Minerals (CENTRUM SILVER PO) Take 1 tablet by mouth every other day.       . nebivolol (BYSTOLIC) 10 MG tablet Take 1 tablet twice daily  60 tablet  3  . olmesartan-hydrochlorothiazide (BENICAR HCT) 40-12.5 MG per tablet Take 1 tablet by mouth daily.  30 tablet  3  . potassium chloride (K-DUR,KLOR-CON) 10 MEQ tablet Take 20 mEq by mouth every other day.       No current facility-administered medications for this visit.    Past Medical History  Diagnosis Date  . Anemia    . Diabetes mellitus   . Hypertension     Past Surgical History  Procedure Laterality Date  . Abdominal hysterectomy      Family History  Problem Relation Age of Onset  . Brain cancer Mother     History   Social History  . Marital Status: Single    Spouse Name: N/A    Number of Children: N/A  . Years of Education: N/A   Occupational History  . Not on file.   Social History Main Topics  . Smoking status: Never Smoker   . Smokeless tobacco: Not on file  . Alcohol Use: Not on file  . Drug Use: Not on file  . Sexual Activity: Not on file   Other Topics Concern  . Not on file   Social History Narrative  . No narrative on file    ROS: Positive for mild lower extremity swelling and muscle cramping.  Otherwise as stated in the HPI and negative for all other systems.  PHYSICAL EXAM BP 183/102  Pulse 69  Ht 5\' 5"  (1.651 m)  Wt 228 lb (103.42 kg)  BMI 37.94 kg/m2 GENERAL:  Well appearing HEENT:  Pupils equal round and reactive, fundi not visualized, oral mucosa unremarkable NECK:  No jugular venous distention,  waveform within normal limits, carotid upstroke brisk and symmetric, no bruits, no thyromegaly LYMPHATICS:  No cervical, inguinal adenopathy LUNGS:  Clear to auscultation bilaterally BACK:  No CVA tenderness CHEST:  Unremarkable HEART:  PMI not displaced or sustained,S1 and S2 within normal limits, no S3, no S4, no clicks, no rubs, no murmurs ABD:  Flat, positive bowel sounds normal in frequency in pitch, no bruits, no rebound, no guarding, no midline pulsatile mass, no hepatomegaly, no splenomegaly EXT:  2 plus pulses throughout, no edema, no cyanosis no clubbing SKIN:  No rashes no nodules NEURO:  Cranial nerves II through XII grossly intact, motor grossly intact throughout PSYCH:  Cognitively intact, oriented to person place and time   EKG:  Sinus rhythm, rate 60 to, leftward axis, premature atrial contractions, no acute ST-T wave changes.   05/31/2014  ASSESSMENT AND PLAN   HTN:  She reports Tayvian [place] coat hypertension. She says it's in the 941D to 408X systolic at home. I would like to increase her Benicar HCT to 40/25. She can increase her potassium containing foods and I'm sure she will have electrolytes with upcoming surgery. Her last potassium was high normal. We also discussed weight loss for management of her blood pressure.  PREOP:  The patient has no high-risk findings or history. She has a moderatefunctional level (4 METS).  However, she is not going for a high-risk procedure. Therefore, based on ACC/AHA guidelines, the patient would be at acceptable risk for the planned procedure without further cardiovascular testing.  OVERWEIGHT:  The patient understands the need to lose weight with diet and exercise. We have discussed specific strategies for this.

## 2014-05-31 NOTE — Patient Instructions (Signed)
Please increase Benicar to 40/25 mg a day.  Continue all other medications as listed.  Follow up as needed with Dr Percival Spanish.

## 2014-06-06 ENCOUNTER — Ambulatory Visit (AMBULATORY_SURGERY_CENTER): Payer: Self-pay | Admitting: *Deleted

## 2014-06-06 VITALS — Ht 65.0 in | Wt 235.0 lb

## 2014-06-06 DIAGNOSIS — Z860100 Personal history of colon polyps, unspecified: Secondary | ICD-10-CM

## 2014-06-06 MED ORDER — MOVIPREP 100 G PO SOLR: ORAL | Status: DC

## 2014-06-06 NOTE — Progress Notes (Signed)
No allergies to eggs or soy. No problems with anesthesia.  Pt given Emmi instructions for colonoscopy  No oxygen use  No diet drug use  

## 2014-06-07 HISTORY — PX: JOINT REPLACEMENT: SHX530

## 2014-06-08 ENCOUNTER — Encounter: Payer: Self-pay | Admitting: Internal Medicine

## 2014-06-16 ENCOUNTER — Encounter (HOSPITAL_COMMUNITY): Payer: Self-pay | Admitting: Pharmacy Technician

## 2014-06-21 ENCOUNTER — Encounter: Payer: Self-pay | Admitting: Internal Medicine

## 2014-06-21 ENCOUNTER — Ambulatory Visit (AMBULATORY_SURGERY_CENTER): Payer: Medicare Other | Admitting: Internal Medicine

## 2014-06-21 VITALS — BP 147/98 | HR 58 | Temp 97.7°F | Resp 23 | Ht 65.0 in | Wt 235.0 lb

## 2014-06-21 DIAGNOSIS — Z860100 Personal history of colon polyps, unspecified: Secondary | ICD-10-CM

## 2014-06-21 HISTORY — PX: OTHER SURGICAL HISTORY: SHX169

## 2014-06-21 MED ORDER — SODIUM CHLORIDE 0.9 % IV SOLN: 500.0000 mL | INTRAVENOUS | Status: DC

## 2014-06-21 NOTE — Patient Instructions (Signed)

## 2014-06-21 NOTE — Op Note (Signed)
[place]  [address]. [place], [address]   COLONOSCOPY PROCEDURE REPORT  PATIENT: Beth [name], Beth [name].  MR#: [id number] BIRTHDATE: 1946-08-27 , 68  yrs. old GENDER: Female ENDOSCOPIST: Eustace Quail, MD REFERRED IN:[id number] Program Recall PROCEDURE DATE:  06/21/2014 PROCEDURE:   Colonoscopy, surveillance First Screening Colonoscopy - Avg.  risk and is 50 yrs.  old or older - No.  Prior Negative Screening - Now for repeat screening. N/A  History of Adenoma - Now for follow-up colonoscopy & has been > or = to 3 yrs.  Yes hx of adenoma.  Has been 3 or more years since last colonoscopy.  Polyps Removed Today? No.  Recommend repeat exam, <10 yrs? No. ASA CLASS:   Class II INDICATIONS:Patient's personal history of adenomatous colon polyps. Index exam 2003 with a diminutive adenoma. Followup 2006 was negative. Followup 2010 with diminutive adenoma. MEDICATIONS: MAC sedation, administered by CRNA and propofol (Diprivan) 320mg  IV  DESCRIPTION OF PROCEDURE:   After the risks benefits and alternatives of the procedure were thoroughly explained, informed consent was obtained.  A digital rectal exam revealed no abnormalities of the rectum.   The [id number]  endoscope was introduced through the anus and advanced to the cecum, which was identified by the ileocecal valve. No adverse events experienced. The exam was made difficult secondary to body habitus. The quality of the prep was excellent, using MoviPrep  The instrument was then slowly withdrawn as the colon was fully examined.      COLON FINDINGS: The colonoscope was advanced to the proximal ascending colon. Despite moving the patient in all 4 positions, counter pressure, and great efforts, the cecum was not deeply intubated. Mild diverticulosis was noted in the sigmoid colon. The colon was otherwise normal.  There was no  inflammation, polyps or cancers unless previously stated.  Retroflexed views  revealed internal hemorrhoids. The time to cecum=20 minutes 05 seconds. Withdrawal time=6 minutes 11 seconds.  The scope was withdrawn and the procedure completed. COMPLICATIONS: There were no complications.  ENDOSCOPIC IMPRESSION: 1.    Mild diverticulosis was noted in the sigmoid colon 2.   The colon was otherwise normal without deep cecal intubation. The cecum was completely visualized and photographed on previous 3 colonoscopies  RECOMMENDATIONS: 1. Return to the care of your primary provider.  GI follow up as needed   eSigned:  Eustace Quail, MD 06/21/2014 2:25 PM   cc: The Patient and Redge Gainer, MD   ([place], [place])

## 2014-06-21 NOTE — Progress Notes (Signed)
Report to PACU, RN, vss, BBS= Clear.  

## 2014-06-21 NOTE — H&P (Signed)
TOTAL KNEE ADMISSION H&P  Patient is being admitted for right total knee arthroplasty.  Subjective:  Chief Complaint:right knee pain.  HPI: Beth [name], 68 y.o. female, has a history of pain and functional disability in the right knee due to arthritis and has failed non-surgical conservative treatments for greater than 12 weeks to includeNSAID's and/or analgesics, corticosteriod injections, weight reduction as appropriate and activity modification.  Onset of symptoms was gradual, starting 8 years ago with gradually worsening course since that time. The patient noted no past surgery on the right knee(s).  Patient currently rates pain in the right knee(s) at 8 out of 10 with activity. Patient has night pain, worsening of pain with activity and weight bearing, pain that interferes with activities of daily living, pain with passive range of motion, crepitus and joint swelling.  Patient has evidence of periarticular osteophytes and joint space narrowing by imaging studies.  There is no active infection.  Patient Active Problem List   Diagnosis Date Noted  . Preop cardiovascular exam 05/31/2014  . HLD (hyperlipidemia) 01/04/2014  . HTN (hypertension) 01/04/2014  . Hypokalemia 01/04/2014  . Unspecified deficiency anemia 12/30/2011  . CONSTIPATION 04/04/2009  . FLATULENCE-GAS-BLOATING 04/04/2009  . CHANGE IN BOWELS 04/04/2009  . TUBULOVILLOUS ADENOMA, COLON 04/03/2009   Past Medical History  Diagnosis Date  . Anemia   . Diabetes mellitus   . Hypertension   . CVA (cerebral infarction)     2006  . Hyperlipidemia   . Arthritis   . Blood transfusion without reported diagnosis 2012    anemia  . Heart murmur   . Sleep apnea     no cpap    Past Surgical History  Procedure Laterality Date  . Abdominal hysterectomy    . Carpal tunnel release Right 1983     Allergies  Allergen Reactions  . Clonidine Derivatives Other (See Comments)    Dizziness and weakness  . Shellfish Allergy Nausea  And Vomiting    History  Substance Use Topics  . Smoking status: Never Smoker   . Smokeless tobacco: Never Used  . Alcohol Use: No    Family History  Problem Relation Age of Onset  . Brain cancer Mother   . Diabetes Son   . Diabetes Son   . Colon cancer Neg Hx    Current outpatient prescriptions: amLODipine (NORVASC) 10 MG tablet, Take 10 mg by mouth every evening., Disp: , Rfl: ;   atorvastatin (LIPITOR) 20 MG tablet, Take 20 mg by mouth every evening., Disp: , Rfl: ;   Cholecalciferol (VITAMIN D-3 PO), Take 400 Units by mouth every other day. , Disp: , Rfl: ;   COD LIVER OIL PO, Take 530 mg by mouth every other day. , Disp: , Rfl:  furosemide (LASIX) 80 MG tablet, Take 80 mg by mouth every other day. , Disp: , Rfl: ;   MOVIPREP 100 G SOLR, moviprep as directed. No substitutions, Disp: 1 kit, Rfl: 0;   Multiple Vitamins-Minerals (CENTRUM SILVER PO), Take 1 tablet by mouth every other day. , Disp: , Rfl: ;   nebivolol (BYSTOLIC) 10 MG tablet, Take 10 mg by mouth every morning., Disp: , Rfl:  olmesartan-hydrochlorothiazide (BENICAR HCT) 40-12.5 MG per tablet, Take 1 tablet by mouth every morning., Disp: , Rfl: ;   Omega-3 Fatty Acids (FISH OIL) 300 MG CAPS, Take 300 mg by mouth every other day., Disp: , Rfl: ;   sodium chloride (OCEAN) 0.65 % SOLN nasal spray, Place 1 spray into both nostrils  2 (two) times daily as needed for congestion., Disp: , Rfl:   Review of Systems  Constitutional: Negative.   HENT: Negative.   Eyes: Negative.   Respiratory: Negative.   Cardiovascular: Positive for orthopnea and leg swelling. Negative for chest pain, palpitations, claudication and PND.  Gastrointestinal: Negative.   Genitourinary: Positive for frequency. Negative for dysuria, urgency, hematuria and flank pain.  Musculoskeletal: Positive for joint pain. Negative for back pain, falls, myalgias and neck pain.       Right knee pain  Skin: Negative.   Neurological: Negative.    Endo/Heme/Allergies: Negative.   Psychiatric/Behavioral: Negative.     Objective:  Physical Exam  Constitutional: She is oriented to person, place, and time. She appears well-developed and well-nourished. No distress.  HENT:  Head: Normocephalic and atraumatic.  Right Ear: External ear normal.  Left Ear: External ear normal.  Nose: Nose normal.  Mouth/Throat: Oropharynx is clear and moist.  Eyes: Conjunctivae and EOM are normal.  Neck: Normal range of motion. Neck supple.  Cardiovascular: Normal rate, regular rhythm and intact distal pulses.   Murmur heard.  Systolic murmur is present with a grade of 3/6  Respiratory: Effort normal and breath sounds normal. No respiratory distress. She has no wheezes.  GI: Soft. Bowel sounds are normal. She exhibits no distension. There is no tenderness.  Musculoskeletal:       Right hip: Normal.       Left hip: Normal.       Right knee: She exhibits decreased range of motion and swelling. She exhibits no effusion and no erythema. Tenderness found. Medial joint line and lateral joint line tenderness noted.       Left knee: She exhibits normal range of motion, no swelling, no effusion and no erythema. Tenderness found. Medial joint line tenderness noted. No lateral joint line tenderness noted.       Right lower leg: She exhibits no tenderness and no swelling.       Left lower leg: She exhibits no tenderness and no swelling.  No effusion. Range is about 5-125. Moderate crepitus on range of motion. Tender media land lateral with no instability noted. Her left knee has no effusion. Range is about 0-130. Slight tenderness medial with no lateral tenderness. No instability.  Neurological: She is alert and oriented to person, place, and time. She has normal strength and normal reflexes. No sensory deficit.  Skin: No rash noted. She is not diaphoretic. No erythema.  Psychiatric: She has a normal mood and affect. Her behavior is normal.     Vitals Weight: 237.38 lb Height: 63 in Body Surface Area: 2.19 m Body Mass Index: 42.05 kg/m Pulse: 64 (Regular) BP: 156/88 (Sitting, Left Arm, Standard)  Imaging Review Plain radiographs demonstrate severe degenerative joint disease of the right knee(s). The overall alignment issignificant varus. The bone quality appears to be fair for age and reported activity level.  Assessment/Plan:  End stage arthritis, right knee   The patient history, physical examination, clinical judgment of the provider and imaging studies are consistent with end stage degenerative joint disease of the right knee(s) and total knee arthroplasty is deemed medically necessary. The treatment options including medical management, injection therapy arthroscopy and arthroplasty were discussed at length. The risks and benefits of total knee arthroplasty were presented and reviewed. The risks due to aseptic loosening, infection, stiffness, patella tracking problems, thromboembolic complications and other imponderables were discussed. The patient acknowledged the explanation, agreed to proceed with the plan and consent was signed.  Patient is being admitted for inpatient treatment for surgery, pain control, PT, OT, prophylactic antibiotics, VTE prophylaxis, progressive ambulation and ADL's and discharge planning. The patient is planning to be discharged home with home health services    [place], [place]

## 2014-06-22 ENCOUNTER — Telehealth: Payer: Self-pay | Admitting: *Deleted

## 2014-06-22 ENCOUNTER — Other Ambulatory Visit: Payer: Self-pay | Admitting: Orthopedic Surgery

## 2014-06-22 NOTE — Telephone Encounter (Signed)
  Follow up Call-  Call back number 06/21/2014  Post procedure Call Back phone  # [phone]  Permission to leave phone message Yes     Patient questions:  Do you have a fever, pain , or abdominal swelling? No. Pain Score  0 *  Have you tolerated food without any problems? Yes.    Have you been able to return to your normal activities? Yes.    Do you have any questions about your discharge instructions: Diet   No. Medications  No. Follow up visit  No.  Do you have questions or concerns about your Care? No.  Actions: * If pain score is 4 or above: No action needed, pain <4.

## 2014-06-27 ENCOUNTER — Other Ambulatory Visit (HOSPITAL_COMMUNITY): Payer: Self-pay | Admitting: Orthopedic Surgery

## 2014-06-27 ENCOUNTER — Encounter (HOSPITAL_COMMUNITY): Payer: Self-pay

## 2014-06-27 ENCOUNTER — Encounter (HOSPITAL_COMMUNITY)
Admission: RE | Admit: 2014-06-27 | Discharge: 2014-06-27 | Disposition: A | Discharge status: Home / Self Care | Payer: Medicare Other | Source: Ambulatory Visit | Attending: Orthopedic Surgery | Admitting: Orthopedic Surgery

## 2014-06-27 DIAGNOSIS — Z01812 Encounter for preprocedural laboratory examination: Secondary | ICD-10-CM | POA: Diagnosis not present

## 2014-06-27 DIAGNOSIS — Z01818 Encounter for other preprocedural examination: Secondary | ICD-10-CM | POA: Diagnosis not present

## 2014-06-27 HISTORY — DX: Family history of other specified conditions: Z84.89

## 2014-06-27 HISTORY — DX: Cerebral infarction, unspecified: I63.9

## 2014-06-27 HISTORY — DX: Gout, unspecified: M10.9

## 2014-06-27 HISTORY — DX: Nocturia: R35.1

## 2014-06-27 LAB — SURGICAL PCR SCREEN
MRSA, PCR: NEGATIVE
STAPHYLOCOCCUS AUREUS: NEGATIVE

## 2014-06-27 LAB — PROTIME-INR
INR: 1.07 (ref 0.00–1.49)
Prothrombin Time: 13.9 seconds (ref 11.6–15.2)

## 2014-06-27 LAB — COMPREHENSIVE METABOLIC PANEL
ALT: 14 U/L (ref 0–35)
AST: 15 U/L (ref 0–37)
Albumin: 4.1 g/dL (ref 3.5–5.2)
Alkaline Phosphatase: 122 U/L — ABNORMAL HIGH (ref 39–117)
Anion gap: 13 (ref 5–15)
BUN: 22 mg/dL (ref 6–23)
CALCIUM: 10.1 mg/dL (ref 8.4–10.5)
CO2: 30 meq/L (ref 19–32)
Chloride: 97 mEq/L (ref 96–112)
Creatinine, Ser: 0.95 mg/dL (ref 0.50–1.10)
GFR calc Af Amer: 69 mL/min — ABNORMAL LOW (ref >90)
GFR calc non Af Amer: 59 mL/min — ABNORMAL LOW (ref >90)
Glucose, Bld: 103 mg/dL — ABNORMAL HIGH (ref 70–99)
POTASSIUM: 3.6 meq/L — AB (ref 3.7–5.3)
SODIUM: 140 meq/L (ref 137–147)
TOTAL PROTEIN: 8.1 g/dL (ref 6.0–8.3)
Total Bilirubin: 0.9 mg/dL (ref 0.3–1.2)

## 2014-06-27 LAB — URINALYSIS, ROUTINE W REFLEX MICROSCOPIC
Bilirubin Urine: NEGATIVE
GLUCOSE, UA: NEGATIVE mg/dL
Hgb urine dipstick: NEGATIVE
Ketones, ur: NEGATIVE mg/dL
Nitrite: NEGATIVE
Protein, ur: 30 mg/dL — AB
Specific Gravity, Urine: 1.015 (ref 1.005–1.030)
Urobilinogen, UA: 0.2 mg/dL (ref 0.0–1.0)
pH: 6 (ref 5.0–8.0)

## 2014-06-27 LAB — URINE MICROSCOPIC-ADD ON

## 2014-06-27 LAB — CBC
HCT: 40.7 % (ref 36.0–46.0)
HEMOGLOBIN: 13.1 g/dL (ref 12.0–15.0)
MCH: 25.9 pg — ABNORMAL LOW (ref 26.0–34.0)
MCHC: 32.2 g/dL (ref 30.0–36.0)
MCV: 80.4 fL (ref 78.0–100.0)
Platelets: 308 10*3/uL (ref 150–400)
RBC: 5.06 MIL/uL (ref 3.87–5.11)
RDW: 14.5 % (ref 11.5–15.5)
WBC: 8.2 10*3/uL (ref 4.0–10.5)

## 2014-06-27 LAB — APTT: aPTT: 32 seconds (ref 24–37)

## 2014-06-27 NOTE — Progress Notes (Signed)
Medical clearnce note Beth [name] on chart  cardiac clearanec note 05-31-14 dr hochrein epic Chest xray 2 view 04-03-14 epic ekg 05-31-14 epic

## 2014-06-27 NOTE — Patient Instructions (Addendum)
18 Beth [name]  06/27/2014   Your procedure is scheduled on: Monday July 27th, 2015  Report to [facility] Main Entrance and follow signs to  [place] at 940 AM.  Call this number if you have problems the morning of surgery [phone]   Remember:  Do not eat food or drink liquids :After Midnight.     Take these medicines the morning of surgery with A SIP OF WATER: bystolic                               You may not have any metal on your body including hair pins and piercings  Do not wear jewelry, make-up, lotions, powders, or deodorant.   Men may shave face and neck.  Do not bring valuables to the hospital. [place].  Contacts, dentures or bridgework may not be worn into surgery.  Leave suitcase in the car. After surgery it may be brought to your room.  For patients admitted to the hospital, checkout time is 11:00 AM the day of discharge.   ________________________________________________________________________  [facility] - Preparing for Surgery Before surgery, you can play an important role.  Because skin is not sterile, your skin needs to be as free of germs as possible.  You can reduce the number of germs on your skin by washing with CHG (chlorahexidine gluconate) soap before surgery.  CHG is an antiseptic cleaner which kills germs and bonds with the skin to continue killing germs even after washing. Please DO NOT use if you have an allergy to CHG or antibacterial soaps.  If your skin becomes reddened/irritated stop using the CHG and inform your nurse when you arrive at Short Stay. Do not shave (including legs and underarms) for at least 48 hours prior to the first CHG shower.  You may shave your face/neck. Please follow these instructions carefully:  1.  Shower with CHG Soap the night before surgery and the  morning of Surgery.  2.  If you choose to wash your hair, wash your hair first as usual with your  normal   shampoo.  3.  After you shampoo, rinse your hair and body thoroughly to remove the  shampoo.                           4.  Use CHG as you would any other liquid soap.  You can apply chg directly  to the skin and wash                       Gently with a scrungie or clean washcloth.  5.  Apply the CHG Soap to your body ONLY FROM THE NECK DOWN.   Do not use on face/ open                           Wound or open sores. Avoid contact with eyes, ears mouth and genitals (private parts).                       Wash face,  Genitals (private parts) with your normal soap.             6.  Wash thoroughly, paying special attention to the area where your surgery  will be performed.  7.  Thoroughly  rinse your body with warm water from the neck down.  8.  DO NOT shower/wash with your normal soap after using and rinsing off  the CHG Soap.                9.  Pat yourself dry with a clean towel.            10.  Wear clean pajamas.            11.  Place clean sheets on your bed the night of your first shower and do not  sleep with pets. Day of Surgery : Do not apply any lotions/deodorants the morning of surgery.  Please wear clean clothes to the hospital/surgery center.  FAILURE TO FOLLOW THESE INSTRUCTIONS MAY RESULT IN THE CANCELLATION OF YOUR SURGERY PATIENT SIGNATURE_________________________________  NURSE SIGNATURE__________________________________  ________________________________________________________________________   Beth [name]  An incentive spirometer is a tool that can help keep your lungs clear and active. This tool measures how well you are filling your lungs with each breath. Taking long deep breaths may help reverse or decrease the chance of developing breathing (pulmonary) problems (especially infection) following:  A long period of time when you are unable to move or be active. BEFORE THE PROCEDURE   If the spirometer includes an indicator to show your best effort, your nurse or  respiratory therapist will set it to a desired goal.  If possible, sit up straight or lean slightly forward. Try not to slouch.  Hold the incentive spirometer in an upright position. INSTRUCTIONS FOR USE  1. Sit on the edge of your bed if possible, or sit up as far as you can in bed or on a chair. 2. Hold the incentive spirometer in an upright position. 3. Breathe out normally. 4. Place the mouthpiece in your mouth and seal your lips tightly around it. 5. Breathe in slowly and as deeply as possible, raising the piston or the ball toward the top of the column. 6. Hold your breath for 3-5 seconds or for as long as possible. Allow the piston or ball to fall to the bottom of the column. 7. Remove the mouthpiece from your mouth and breathe out normally. 8. Rest for a few seconds and repeat Steps 1 through 7 at least 10 times every 1-2 hours when you are awake. Take your time and take a few normal breaths between deep breaths. 9. The spirometer may include an indicator to show your best effort. Use the indicator as a goal to work toward during each repetition. 10. After each set of 10 deep breaths, practice coughing to be sure your lungs are clear. If you have an incision (the cut made at the time of surgery), support your incision when coughing by placing a pillow or rolled up towels firmly against it. Once you are able to get out of bed, walk around indoors and cough well. You may stop using the incentive spirometer when instructed by your caregiver.  RISKS AND COMPLICATIONS  Take your time so you do not get dizzy or light-headed.  If you are in pain, you may need to take or ask for pain medication before doing incentive spirometry. It is harder to take a deep breath if you are having pain. AFTER USE  Rest and breathe slowly and easily.  It can be helpful to keep track of a log of your progress. Your caregiver can provide you with a simple table to help with this. If you are using the  spirometer  at home, follow these instructions: [place] IF:   You are having difficultly using the spirometer.  You have trouble using the spirometer as often as instructed.  Your pain medication is not giving enough relief while using the spirometer.  You develop fever of 100.5 F (38.1 C) or higher. SEEK IMMEDIATE MEDICAL CARE IF:   You cough up bloody sputum that had not been present before.  You develop fever of 102 F (38.9 C) or greater.  You develop worsening pain at or near the incision site. MAKE SURE YOU:   Understand these instructions.  Will watch your condition.  Will get help right away if you are not doing well or get worse. Document Released: 04/06/2007 Document Revised: 02/16/2012 Document Reviewed: 06/07/2007 Exit[employer] Patient Information 2014 Exit[employer], [place].   ________________________________________________________________________  WHAT IS A BLOOD TRANSFUSION? Blood Transfusion Information  A transfusion is the replacement of blood or some of its parts. Blood is made up of multiple cells which provide different functions.  Red blood cells carry oxygen and are used for blood loss replacement.  White blood cells fight against infection.  Platelets control bleeding.  Plasma helps clot blood.  Other blood products are available for specialized needs, such as hemophilia or other clotting disorders. BEFORE THE TRANSFUSION  Who gives blood for transfusions?   Healthy volunteers who are fully evaluated to make sure their blood is safe. This is blood bank blood. Transfusion therapy is the safest it has ever been in the practice of medicine. Before blood is taken from a donor, a complete history is taken to make sure that person has no history of diseases nor engages in risky social behavior (examples are intravenous drug use or sexual activity with multiple partners). The donor's travel history is screened to minimize risk of transmitting  infections, such as malaria. The donated blood is tested for signs of infectious diseases, such as HIV and hepatitis. The blood is then tested to be sure it is compatible with you in order to minimize the chance of a transfusion reaction. If you or a relative donates blood, this is often done in anticipation of surgery and is not appropriate for emergency situations. It takes many days to process the donated blood. RISKS AND COMPLICATIONS Although transfusion therapy is very safe and saves many lives, the main dangers of transfusion include:   Getting an infectious disease.  Developing a transfusion reaction. This is an allergic reaction to something in the blood you were given. Every precaution is taken to prevent this. The decision to have a blood transfusion has been considered carefully by your caregiver before blood is given. Blood is not given unless the benefits outweigh the risks. AFTER THE TRANSFUSION  Right after receiving a blood transfusion, you will usually feel much better and more energetic. This is especially true if your red blood cells have gotten low (anemic). The transfusion raises the level of the red blood cells which carry oxygen, and this usually causes an energy increase.  The nurse administering the transfusion will monitor you carefully for complications. HOME CARE INSTRUCTIONS  No special instructions are needed after a transfusion. You may find your energy is better. Speak with your caregiver about any limitations on activity for underlying diseases you may have. SEEK MEDICAL CARE IF:   Your condition is not improving after your transfusion.  You develop redness or irritation at the intravenous (IV) site. SEEK IMMEDIATE MEDICAL CARE IF:  Any of the following symptoms occur over the next  12 hours:  Shaking chills.  You have a temperature by mouth above 102 F (38.9 C), not controlled by medicine.  Chest, back, or muscle pain.  People around you feel you are  not acting correctly or are confused.  Shortness of breath or difficulty breathing.  Dizziness and fainting.  You get a rash or develop hives.  You have a decrease in urine output.  Your urine turns a dark color or changes to pink, red, or brown. Any of the following symptoms occur over the next 10 days:  You have a temperature by mouth above 102 F (38.9 C), not controlled by medicine.  Shortness of breath.  Weakness after normal activity.  The white part of the eye turns yellow (jaundice).  You have a decrease in the amount of urine or are urinating less often.  Your urine turns a dark color or changes to pink, red, or brown. Document Released: 11/21/2000 Document Revised: 02/16/2012 Document Reviewed: 07/10/2008 [facility] Patient Information 2014 [place], [place].  _______________________________________________________________________

## 2014-06-27 NOTE — Progress Notes (Signed)
Micro ua results faxed to dr aluisio by epic 

## 2014-07-03 ENCOUNTER — Encounter (HOSPITAL_COMMUNITY): Payer: Medicare Other | Admitting: Anesthesiology

## 2014-07-03 ENCOUNTER — Encounter (HOSPITAL_COMMUNITY): Payer: Self-pay | Admitting: *Deleted

## 2014-07-03 ENCOUNTER — Encounter (HOSPITAL_COMMUNITY)
Admission: RE | Disposition: A | Discharge status: Home Health | Payer: Self-pay | Source: Ambulatory Visit | Attending: Orthopedic Surgery

## 2014-07-03 ENCOUNTER — Inpatient Hospital Stay (HOSPITAL_COMMUNITY)
Admission: RE | Admit: 2014-07-03 | Discharge: 2014-07-05 | DRG: 470 | Disposition: A | Discharge status: Home Health | Payer: Medicare Other | Source: Ambulatory Visit | Attending: Orthopedic Surgery | Admitting: Orthopedic Surgery

## 2014-07-03 ENCOUNTER — Inpatient Hospital Stay (HOSPITAL_COMMUNITY): Payer: Medicare Other | Admitting: Anesthesiology

## 2014-07-03 DIAGNOSIS — Z8673 Personal history of transient ischemic attack (TIA), and cerebral infarction without residual deficits: Secondary | ICD-10-CM

## 2014-07-03 DIAGNOSIS — M1710 Unilateral primary osteoarthritis, unspecified knee: Principal | ICD-10-CM | POA: Diagnosis present

## 2014-07-03 DIAGNOSIS — G4730 Sleep apnea, unspecified: Secondary | ICD-10-CM | POA: Diagnosis present

## 2014-07-03 DIAGNOSIS — I10 Essential (primary) hypertension: Secondary | ICD-10-CM | POA: Diagnosis present

## 2014-07-03 DIAGNOSIS — Z79899 Other long term (current) drug therapy: Secondary | ICD-10-CM | POA: Diagnosis not present

## 2014-07-03 DIAGNOSIS — Z6841 Body Mass Index (BMI) 40.0 and over, adult: Secondary | ICD-10-CM | POA: Diagnosis not present

## 2014-07-03 DIAGNOSIS — M25569 Pain in unspecified knee: Secondary | ICD-10-CM | POA: Diagnosis present

## 2014-07-03 DIAGNOSIS — Z01812 Encounter for preprocedural laboratory examination: Secondary | ICD-10-CM | POA: Diagnosis not present

## 2014-07-03 DIAGNOSIS — E785 Hyperlipidemia, unspecified: Secondary | ICD-10-CM | POA: Diagnosis present

## 2014-07-03 DIAGNOSIS — E119 Type 2 diabetes mellitus without complications: Secondary | ICD-10-CM | POA: Diagnosis present

## 2014-07-03 DIAGNOSIS — M179 Osteoarthritis of knee, unspecified: Secondary | ICD-10-CM | POA: Diagnosis present

## 2014-07-03 DIAGNOSIS — M1711 Unilateral primary osteoarthritis, right knee: Secondary | ICD-10-CM

## 2014-07-03 HISTORY — PX: TOTAL KNEE ARTHROPLASTY: SHX125

## 2014-07-03 LAB — TYPE AND SCREEN
ABO/RH(D): O POS
Antibody Screen: NEGATIVE

## 2014-07-03 LAB — ABO/RH: ABO/RH(D): O POS

## 2014-07-03 SURGERY — ARTHROPLASTY, KNEE, TOTAL
Anesthesia: Spinal | Site: Knee | Laterality: Right

## 2014-07-03 MED ORDER — EPHEDRINE SULFATE 50 MG/ML IJ SOLN
INTRAMUSCULAR | Status: AC
  Filled 2014-07-03: qty 1

## 2014-07-03 MED ORDER — POLYETHYLENE GLYCOL 3350 17 G PO PACK
17.0000 g | PACK | Freq: Every day | ORAL | Status: DC | PRN
  Administered 2014-07-04: 17 g via ORAL

## 2014-07-03 MED ORDER — OXYCODONE HCL 5 MG PO TABS: 5.0000 mg | ORAL_TABLET | Freq: Once | ORAL | Status: DC | PRN

## 2014-07-03 MED ORDER — PROMETHAZINE HCL 25 MG/ML IJ SOLN
6.2500 mg | INTRAMUSCULAR | Status: DC | PRN
Start: 2014-07-03 — End: 2014-07-03

## 2014-07-03 MED ORDER — MIDAZOLAM HCL 5 MG/5ML IJ SOLN
INTRAMUSCULAR | Status: DC | PRN
  Administered 2014-07-03: 2 mg via INTRAVENOUS

## 2014-07-03 MED ORDER — LACTATED RINGERS IV SOLN
INTRAVENOUS | Status: DC
  Administered 2014-07-03 (×2): via INTRAVENOUS

## 2014-07-03 MED ORDER — TRAMADOL HCL 50 MG PO TABS
50.0000 mg | ORAL_TABLET | Freq: Four times a day (QID) | ORAL | Status: DC | PRN
  Administered 2014-07-04: 50 mg via ORAL
  Filled 2014-07-03: qty 1

## 2014-07-03 MED ORDER — NEBIVOLOL HCL 10 MG PO TABS
10.0000 mg | ORAL_TABLET | Freq: Every morning | ORAL | Status: DC
  Administered 2014-07-04 – 2014-07-05 (×2): 10 mg via ORAL
  Filled 2014-07-03 (×2): qty 1

## 2014-07-03 MED ORDER — BUPIVACAINE HCL (PF) 0.25 % IJ SOLN
INTRAMUSCULAR | Status: AC
  Filled 2014-07-03: qty 30

## 2014-07-03 MED ORDER — LIDOCAINE HCL (CARDIAC) 20 MG/ML IV SOLN
INTRAVENOUS | Status: AC
  Filled 2014-07-03: qty 5

## 2014-07-03 MED ORDER — SODIUM CHLORIDE 0.9 % IJ SOLN
INTRAMUSCULAR | Status: DC | PRN
  Administered 2014-07-03: 30 mL via INTRAVENOUS

## 2014-07-03 MED ORDER — PNEUMOCOCCAL VAC POLYVALENT 25 MCG/0.5ML IJ INJ
0.5000 mL | INJECTION | INTRAMUSCULAR | Status: DC | PRN
  Filled 2014-07-03: qty 0.5

## 2014-07-03 MED ORDER — DOCUSATE SODIUM 100 MG PO CAPS
100.0000 mg | ORAL_CAPSULE | Freq: Two times a day (BID) | ORAL | Status: DC
Start: 2014-07-03 — End: 2014-07-05
  Administered 2014-07-03 – 2014-07-05 (×3): 100 mg via ORAL

## 2014-07-03 MED ORDER — MEPERIDINE HCL 50 MG/ML IJ SOLN
6.2500 mg | INTRAMUSCULAR | Status: DC | PRN
Start: 2014-07-03 — End: 2014-07-03

## 2014-07-03 MED ORDER — CEFAZOLIN SODIUM-DEXTROSE 2-3 GM-% IV SOLR
2.0000 g | INTRAVENOUS | Status: AC
  Administered 2014-07-03: 2 g via INTRAVENOUS

## 2014-07-03 MED ORDER — METHOCARBAMOL 1000 MG/10ML IJ SOLN
500.0000 mg | Freq: Four times a day (QID) | INTRAVENOUS | Status: DC | PRN
  Administered 2014-07-03 – 2014-07-04 (×2): 500 mg via INTRAVENOUS
  Filled 2014-07-03 (×2): qty 5

## 2014-07-03 MED ORDER — HYDROMORPHONE HCL PF 1 MG/ML IJ SOLN
INTRAMUSCULAR | Status: AC
  Filled 2014-07-03: qty 1

## 2014-07-03 MED ORDER — ONDANSETRON HCL 4 MG/2ML IJ SOLN
INTRAMUSCULAR | Status: DC | PRN
  Administered 2014-07-03: 4 mg via INTRAVENOUS

## 2014-07-03 MED ORDER — MENTHOL 3 MG MT LOZG
1.0000 | LOZENGE | OROMUCOSAL | Status: DC | PRN
  Filled 2014-07-03: qty 9

## 2014-07-03 MED ORDER — DEXAMETHASONE SODIUM PHOSPHATE 10 MG/ML IJ SOLN
10.0000 mg | Freq: Once | INTRAMUSCULAR | Status: AC
  Administered 2014-07-03: 10 mg via INTRAVENOUS

## 2014-07-03 MED ORDER — ONDANSETRON HCL 4 MG PO TABS: 4.0000 mg | ORAL_TABLET | Freq: Four times a day (QID) | ORAL | Status: DC | PRN

## 2014-07-03 MED ORDER — BUPIVACAINE IN DEXTROSE 0.75-8.25 % IT SOLN
INTRATHECAL | Status: DC | PRN
  Administered 2014-07-03: 2 mL via INTRATHECAL

## 2014-07-03 MED ORDER — CEFAZOLIN SODIUM-DEXTROSE 2-3 GM-% IV SOLR
2.0000 g | Freq: Four times a day (QID) | INTRAVENOUS | Status: AC
  Administered 2014-07-03 – 2014-07-04 (×2): 2 g via INTRAVENOUS
  Filled 2014-07-03 (×2): qty 50

## 2014-07-03 MED ORDER — SODIUM CHLORIDE 0.9 % IJ SOLN
INTRAMUSCULAR | Status: AC
  Filled 2014-07-03: qty 50

## 2014-07-03 MED ORDER — PROPOFOL 10 MG/ML IV BOLUS
INTRAVENOUS | Status: AC
  Filled 2014-07-03: qty 20

## 2014-07-03 MED ORDER — FLEET ENEMA 7-19 GM/118ML RE ENEM: 1.0000 | ENEMA | Freq: Once | RECTAL | Status: AC | PRN

## 2014-07-03 MED ORDER — MORPHINE SULFATE 2 MG/ML IJ SOLN
1.0000 mg | INTRAMUSCULAR | Status: DC | PRN
  Administered 2014-07-03: 1 mg via INTRAVENOUS
  Administered 2014-07-03: 2 mg via INTRAVENOUS
  Filled 2014-07-03 (×2): qty 1

## 2014-07-03 MED ORDER — DEXAMETHASONE SODIUM PHOSPHATE 10 MG/ML IJ SOLN
INTRAMUSCULAR | Status: AC
  Filled 2014-07-03: qty 1

## 2014-07-03 MED ORDER — METOCLOPRAMIDE HCL 10 MG PO TABS
5.0000 mg | ORAL_TABLET | Freq: Three times a day (TID) | ORAL | Status: DC | PRN
  Administered 2014-07-05: 10 mg via ORAL
  Filled 2014-07-03: qty 1

## 2014-07-03 MED ORDER — ACETAMINOPHEN 10 MG/ML IV SOLN
1000.0000 mg | Freq: Once | INTRAVENOUS | Status: AC
  Administered 2014-07-03: 1000 mg via INTRAVENOUS
  Filled 2014-07-03: qty 100

## 2014-07-03 MED ORDER — ACETAMINOPHEN 325 MG PO TABS
650.0000 mg | ORAL_TABLET | Freq: Four times a day (QID) | ORAL | Status: DC | PRN
  Administered 2014-07-05: 650 mg via ORAL
  Filled 2014-07-03: qty 2

## 2014-07-03 MED ORDER — PHENOL 1.4 % MT LIQD
1.0000 | OROMUCOSAL | Status: DC | PRN
  Filled 2014-07-03: qty 177

## 2014-07-03 MED ORDER — BISACODYL 10 MG RE SUPP: 10.0000 mg | Freq: Every day | RECTAL | Status: DC | PRN

## 2014-07-03 MED ORDER — METOCLOPRAMIDE HCL 5 MG/ML IJ SOLN
5.0000 mg | Freq: Three times a day (TID) | INTRAMUSCULAR | Status: DC | PRN
  Administered 2014-07-04: 10 mg via INTRAVENOUS
  Filled 2014-07-03: qty 2

## 2014-07-03 MED ORDER — DEXAMETHASONE SODIUM PHOSPHATE 10 MG/ML IJ SOLN
10.0000 mg | Freq: Every day | INTRAMUSCULAR | Status: AC
  Administered 2014-07-04: 10 mg via INTRAVENOUS
  Filled 2014-07-03: qty 1

## 2014-07-03 MED ORDER — LIDOCAINE HCL (CARDIAC) 20 MG/ML IV SOLN
INTRAVENOUS | Status: DC | PRN
  Administered 2014-07-03: 50 mg via INTRAVENOUS

## 2014-07-03 MED ORDER — HYDROMORPHONE HCL PF 1 MG/ML IJ SOLN
0.2500 mg | INTRAMUSCULAR | Status: DC | PRN
  Administered 2014-07-03: 0.5 mg via INTRAVENOUS

## 2014-07-03 MED ORDER — ACETAMINOPHEN 500 MG PO TABS
1000.0000 mg | ORAL_TABLET | Freq: Four times a day (QID) | ORAL | Status: AC
  Administered 2014-07-03 – 2014-07-04 (×4): 1000 mg via ORAL
  Filled 2014-07-03 (×5): qty 2

## 2014-07-03 MED ORDER — MIDAZOLAM HCL 2 MG/2ML IJ SOLN
INTRAMUSCULAR | Status: AC
  Filled 2014-07-03: qty 2

## 2014-07-03 MED ORDER — IRBESARTAN 300 MG PO TABS
300.0000 mg | ORAL_TABLET | Freq: Every day | ORAL | Status: DC
  Filled 2014-07-03: qty 1

## 2014-07-03 MED ORDER — METHOCARBAMOL 500 MG PO TABS
500.0000 mg | ORAL_TABLET | Freq: Four times a day (QID) | ORAL | Status: DC | PRN
  Filled 2014-07-03: qty 1

## 2014-07-03 MED ORDER — BUPIVACAINE HCL 0.25 % IJ SOLN
INTRAMUSCULAR | Status: DC | PRN
  Administered 2014-07-03: 20 mL

## 2014-07-03 MED ORDER — ACETAMINOPHEN 650 MG RE SUPP: 650.0000 mg | Freq: Four times a day (QID) | RECTAL | Status: DC | PRN

## 2014-07-03 MED ORDER — EPHEDRINE SULFATE 50 MG/ML IJ SOLN
INTRAMUSCULAR | Status: DC | PRN
  Administered 2014-07-03: 5 mg via INTRAVENOUS
  Administered 2014-07-03: 10 mg via INTRAVENOUS

## 2014-07-03 MED ORDER — ONDANSETRON HCL 4 MG/2ML IJ SOLN
4.0000 mg | Freq: Four times a day (QID) | INTRAMUSCULAR | Status: DC | PRN
  Administered 2014-07-03 – 2014-07-04 (×3): 4 mg via INTRAVENOUS
  Filled 2014-07-03 (×3): qty 2

## 2014-07-03 MED ORDER — FENTANYL CITRATE 0.05 MG/ML IJ SOLN
INTRAMUSCULAR | Status: AC
  Filled 2014-07-03: qty 2

## 2014-07-03 MED ORDER — POTASSIUM CHLORIDE IN NACL 20-0.9 MEQ/L-% IV SOLN
INTRAVENOUS | Status: DC
  Administered 2014-07-03 – 2014-07-04 (×2): via INTRAVENOUS
  Filled 2014-07-03 (×3): qty 1000

## 2014-07-03 MED ORDER — PROPOFOL INFUSION 10 MG/ML OPTIME
INTRAVENOUS | Status: DC | PRN
  Administered 2014-07-03: 50 ug/kg/min via INTRAVENOUS

## 2014-07-03 MED ORDER — RIVAROXABAN 10 MG PO TABS
10.0000 mg | ORAL_TABLET | Freq: Every day | ORAL | Status: DC
  Administered 2014-07-04 – 2014-07-05 (×2): 10 mg via ORAL
  Filled 2014-07-03 (×3): qty 1

## 2014-07-03 MED ORDER — ONDANSETRON HCL 4 MG/2ML IJ SOLN
INTRAMUSCULAR | Status: AC
  Filled 2014-07-03: qty 2

## 2014-07-03 MED ORDER — DEXAMETHASONE 6 MG PO TABS
10.0000 mg | ORAL_TABLET | Freq: Every day | ORAL | Status: AC
  Filled 2014-07-03: qty 1

## 2014-07-03 MED ORDER — SODIUM CHLORIDE 0.9 % IV SOLN
INTRAVENOUS | Status: DC
Start: 2014-07-03 — End: 2014-07-03

## 2014-07-03 MED ORDER — KETOROLAC TROMETHAMINE 15 MG/ML IJ SOLN
7.5000 mg | Freq: Four times a day (QID) | INTRAMUSCULAR | Status: AC | PRN
  Administered 2014-07-03: 7.5 mg via INTRAVENOUS
  Filled 2014-07-03: qty 1

## 2014-07-03 MED ORDER — AMLODIPINE BESYLATE 10 MG PO TABS
10.0000 mg | ORAL_TABLET | Freq: Every evening | ORAL | Status: DC
  Administered 2014-07-03 – 2014-07-04 (×2): 10 mg via ORAL
  Filled 2014-07-03 (×3): qty 1

## 2014-07-03 MED ORDER — PROPOFOL 10 MG/ML IV BOLUS
INTRAVENOUS | Status: DC | PRN
  Administered 2014-07-03: 20 mg via INTRAVENOUS

## 2014-07-03 MED ORDER — BUPIVACAINE LIPOSOME 1.3 % IJ SUSP
20.0000 mL | Freq: Once | INTRAMUSCULAR | Status: DC
  Filled 2014-07-03: qty 20

## 2014-07-03 MED ORDER — FENTANYL CITRATE 0.05 MG/ML IJ SOLN
INTRAMUSCULAR | Status: DC | PRN
  Administered 2014-07-03 (×2): 50 ug via INTRAVENOUS

## 2014-07-03 MED ORDER — ATORVASTATIN CALCIUM 20 MG PO TABS
20.0000 mg | ORAL_TABLET | Freq: Every evening | ORAL | Status: DC
  Administered 2014-07-03 – 2014-07-04 (×2): 20 mg via ORAL
  Filled 2014-07-03 (×3): qty 1

## 2014-07-03 MED ORDER — CEFAZOLIN SODIUM-DEXTROSE 2-3 GM-% IV SOLR
INTRAVENOUS | Status: AC
  Filled 2014-07-03: qty 50

## 2014-07-03 MED ORDER — OXYCODONE HCL 5 MG PO TABS
5.0000 mg | ORAL_TABLET | ORAL | Status: DC | PRN
  Administered 2014-07-03: 10 mg via ORAL
  Administered 2014-07-04 (×2): 5 mg via ORAL
  Administered 2014-07-04: 10 mg via ORAL
  Administered 2014-07-05: 5 mg via ORAL
  Administered 2014-07-05: 10 mg via ORAL
  Administered 2014-07-05: 5 mg via ORAL
  Filled 2014-07-03 (×2): qty 1
  Filled 2014-07-03 (×2): qty 2
  Filled 2014-07-03 (×4): qty 1

## 2014-07-03 MED ORDER — FUROSEMIDE 80 MG PO TABS
80.0000 mg | ORAL_TABLET | ORAL | Status: DC
  Administered 2014-07-04: 80 mg via ORAL
  Filled 2014-07-03: qty 1

## 2014-07-03 MED ORDER — DIPHENHYDRAMINE HCL 12.5 MG/5ML PO ELIX: 12.5000 mg | ORAL_SOLUTION | ORAL | Status: DC | PRN

## 2014-07-03 MED ORDER — BUPIVACAINE LIPOSOME 1.3 % IJ SUSP
INTRAMUSCULAR | Status: DC | PRN
  Administered 2014-07-03: 20 mL

## 2014-07-03 MED ORDER — OXYCODONE HCL 5 MG/5ML PO SOLN
5.0000 mg | Freq: Once | ORAL | Status: DC | PRN
  Filled 2014-07-03: qty 5

## 2014-07-03 MED ORDER — OLMESARTAN MEDOXOMIL-HCTZ 40-12.5 MG PO TABS: 1.0000 | ORAL_TABLET | Freq: Every morning | ORAL | Status: DC

## 2014-07-03 MED ORDER — HYDROCHLOROTHIAZIDE 12.5 MG PO CAPS
12.5000 mg | ORAL_CAPSULE | Freq: Every day | ORAL | Status: DC
Start: 2014-07-04 — End: 2014-07-04
  Filled 2014-07-03: qty 1

## 2014-07-03 MED ORDER — VITAMINS A & D EX OINT
TOPICAL_OINTMENT | CUTANEOUS | Status: AC
  Administered 2014-07-03: 1
  Filled 2014-07-03: qty 5

## 2014-07-03 SURGICAL SUPPLY — 64 items
BAG SPEC THK2 15X12 ZIP CLS (MISCELLANEOUS) ×1
BAG ZIPLOCK 12X15 (MISCELLANEOUS) ×3 IMPLANT
BANDAGE ELASTIC 6 VELCRO ST LF (GAUZE/BANDAGES/DRESSINGS) ×3 IMPLANT
BANDAGE ESMARK 6X9 LF (GAUZE/BANDAGES/DRESSINGS) ×1 IMPLANT
BLADE SAG 18X100X1.27 (BLADE) ×3 IMPLANT
BLADE SAW SGTL 11.0X1.19X90.0M (BLADE) ×3 IMPLANT
BNDG CMPR 82X61 PLY HI ABS (GAUZE/BANDAGES/DRESSINGS) ×1
BNDG CMPR 9X6 STRL LF SNTH (GAUZE/BANDAGES/DRESSINGS) ×1
BNDG CONFORM 6X.82 1P STRL (GAUZE/BANDAGES/DRESSINGS) ×2 IMPLANT
BNDG ESMARK 6X9 LF (GAUZE/BANDAGES/DRESSINGS) ×3
BOWL SMART MIX CTS (DISPOSABLE) ×3 IMPLANT
CAPT RP KNEE ×2 IMPLANT
CEMENT HV SMART SET (Cement) ×6 IMPLANT
CLOSURE WOUND 1/2 X4 (GAUZE/BANDAGES/DRESSINGS) ×1
CUFF TOURN SGL QUICK 34 (TOURNIQUET CUFF) ×3
CUFF TRNQT CYL 34X4X40X1 (TOURNIQUET CUFF) ×1 IMPLANT
DECANTER SPIKE VIAL GLASS SM (MISCELLANEOUS) ×3 IMPLANT
DRAPE EXTREMITY T 121X128X90 (DRAPE) ×3 IMPLANT
DRAPE POUCH INSTRU U-SHP 10X18 (DRAPES) ×3 IMPLANT
DRAPE U-SHAPE 47X51 STRL (DRAPES) ×3 IMPLANT
DRSG ADAPTIC 3X8 NADH LF (GAUZE/BANDAGES/DRESSINGS) ×3 IMPLANT
DRSG PAD ABDOMINAL 8X10 ST (GAUZE/BANDAGES/DRESSINGS) ×3 IMPLANT
DURAPREP 26ML APPLICATOR (WOUND CARE) ×3 IMPLANT
ELECT REM PT RETURN 9FT ADLT (ELECTROSURGICAL) ×3
ELECTRODE REM PT RTRN 9FT ADLT (ELECTROSURGICAL) ×1 IMPLANT
EVACUATOR 1/8 PVC DRAIN (DRAIN) ×3 IMPLANT
FACESHIELD WRAPAROUND (MASK) ×15 IMPLANT
FACESHIELD WRAPAROUND OR TEAM (MASK) ×5 IMPLANT
GAUZE SPONGE 4X4 12PLY STRL (GAUZE/BANDAGES/DRESSINGS) ×3 IMPLANT
GAUZE XEROFORM 4X4 STRL (GAUZE/BANDAGES/DRESSINGS) ×2 IMPLANT
GLOVE BIO SURGEON STRL SZ7.5 (GLOVE) IMPLANT
GLOVE BIO SURGEON STRL SZ8 (GLOVE) ×3 IMPLANT
GLOVE BIOGEL PI IND STRL 6.5 (GLOVE) IMPLANT
GLOVE BIOGEL PI IND STRL 8 (GLOVE) ×1 IMPLANT
GLOVE BIOGEL PI INDICATOR 6.5 (GLOVE)
GLOVE BIOGEL PI INDICATOR 8 (GLOVE) ×2
GLOVE SURG SS PI 6.5 STRL IVOR (GLOVE) IMPLANT
GOWN STRL REUS W/TWL LRG LVL3 (GOWN DISPOSABLE) ×3 IMPLANT
GOWN STRL REUS W/TWL XL LVL3 (GOWN DISPOSABLE) IMPLANT
HANDPIECE INTERPULSE COAX TIP (DISPOSABLE) ×3
IMMOBILIZER KNEE 20 (SOFTGOODS) ×3
IMMOBILIZER KNEE 20 THIGH 36 (SOFTGOODS) ×1 IMPLANT
KIT BASIN OR (CUSTOM PROCEDURE TRAY) ×3 IMPLANT
MANIFOLD NEPTUNE II (INSTRUMENTS) ×3 IMPLANT
NDL SAFETY ECLIPSE 18X1.5 (NEEDLE) ×2 IMPLANT
NEEDLE HYPO 18GX1.5 SHARP (NEEDLE) ×6
NS IRRIG 1000ML POUR BTL (IV SOLUTION) ×3 IMPLANT
PACK TOTAL JOINT (CUSTOM PROCEDURE TRAY) ×3 IMPLANT
PADDING CAST COTTON 6X4 STRL (CAST SUPPLIES) ×6 IMPLANT
POSITIONER SURGICAL ARM (MISCELLANEOUS) ×3 IMPLANT
SET HNDPC FAN SPRY TIP SCT (DISPOSABLE) ×1 IMPLANT
STRIP CLOSURE SKIN 1/2X4 (GAUZE/BANDAGES/DRESSINGS) ×3 IMPLANT
SUCTION FRAZIER 12FR DISP (SUCTIONS) ×3 IMPLANT
SUT MNCRL AB 4-0 PS2 18 (SUTURE) ×3 IMPLANT
SUT VIC AB 2-0 CT1 27 (SUTURE) ×9
SUT VIC AB 2-0 CT1 TAPERPNT 27 (SUTURE) ×3 IMPLANT
SUT VLOC 180 0 24IN GS25 (SUTURE) ×3 IMPLANT
SYRINGE 20CC LL (MISCELLANEOUS) ×3 IMPLANT
SYRINGE 60CC LL (MISCELLANEOUS) ×3 IMPLANT
TOWEL OR 17X26 10 PK STRL BLUE (TOWEL DISPOSABLE) ×3 IMPLANT
TOWEL OR NON WOVEN STRL DISP B (DISPOSABLE) IMPLANT
TRAY FOLEY CATH 14FRSI W/METER (CATHETERS) ×3 IMPLANT
WATER STERILE IRR 1500ML POUR (IV SOLUTION) ×3 IMPLANT
WRAP KNEE MAXI GEL POST OP (GAUZE/BANDAGES/DRESSINGS) ×3 IMPLANT

## 2014-07-03 NOTE — Anesthesia Procedure Notes (Signed)
Spinal  Patient location during procedure: OR End time: 07/03/2014 12:45 PM Staffing CRNA/Resident: Noralyn Pick Performed by: anesthesiologist and resident/CRNA  Preanesthetic Checklist Completed: patient identified, site marked, surgical consent, pre-op evaluation, timeout performed, IV checked, risks and benefits discussed and monitors and equipment checked Spinal Block Patient position: sitting Prep: Betadine Patient monitoring: heart rate, continuous pulse ox and blood pressure Approach: midline Location: L3-4 Injection technique: single-shot Needle Needle type: Sprotte and Pencil-Tip  Needle gauge: 24 G Needle length: 9 cm Assessment Sensory level: T6 Additional Notes Expiration date of kit checked and confirmed. Patient tolerated procedure well, without complications.

## 2014-07-03 NOTE — Interval H&P Note (Signed)
History and Physical Interval Note:  07/03/2014 12:28 PM  Beth [name]  has presented today for surgery, with the diagnosis of right knee osteoarthritis  The various methods of treatment have been discussed with the patient and family. After consideration of risks, benefits and other options for treatment, the patient has consented to  Procedure(s): RIGHT TOTAL KNEE ARTHROPLASTY (Right) as a surgical intervention .  The patient's history has been reviewed, patient examined, no change in status, stable for surgery.  I have reviewed the patient's chart and labs.  Questions were answered to the patient's satisfaction.     Gearlean Alf

## 2014-07-03 NOTE — Anesthesia Postprocedure Evaluation (Signed)
Anesthesia Post Note  Patient: Beth [name]  Procedure(s) Performed: Procedure(s) (LRB): RIGHT TOTAL KNEE ARTHROPLASTY (Right)  Anesthesia type: Spinal  Patient location: PACU  Post pain: Pain level controlled  Post assessment: Post-op Vital signs reviewed  Last Vitals: BP 145/77  Pulse 68  Temp(Src) 36.3 C (Oral)  Resp 15  Ht 5\' 5"  (1.651 m)  Wt 236 lb (107.049 kg)  BMI 39.27 kg/m2  SpO2 96%  Post vital signs: Reviewed  Level of consciousness: sedated  Complications: No apparent anesthesia complications

## 2014-07-03 NOTE — Op Note (Signed)
Pre-operative diagnosis- Osteoarthritis  Right knee(s)  Post-operative diagnosis- Osteoarthritis Right knee(s)  Procedure-  Right  Total Knee Arthroplasty  Surgeon- Dione Plover. Antwoin Lackey, MD  Assistant- [name], PA-C   Anesthesia-  Spinal  EBL-* No blood loss amount entered *   Drains Hemovac  Tourniquet time-  Total Tourniquet Time Documented: Thigh (Right) - 37 minutes Total: Thigh (Right) - 37 minutes     Complications- None  Condition-PACU - hemodynamically stable.   Brief Clinical Note  Beth [name] is a 68 y.o. year old female with end stage OA of her right knee with progressively worsening pain and dysfunction. She has constant pain, with activity and at rest and significant functional deficits with difficulties even with ADLs. She has had extensive non-op management including analgesics, injections of cortisone and viscosupplements, and home exercise program, but remains in significant pain with significant dysfunction.Radiographs show bone on bone arthritis medial and aptellofemoral. She presents now for right Total Knee Arthroplasty.    Procedure in detail---   The patient is brought into the operating room and positioned supine on the operating table. After successful administration of  Spinal,   a tourniquet is placed high on the  Right thigh(s) and the lower extremity is prepped and draped in the usual sterile fashion. Time out is performed by the operating team and then the  Right lower extremity is wrapped in Esmarch, knee flexed and the tourniquet inflated to 300 mmHg.       A midline incision is made with a ten blade through the subcutaneous tissue to the level of the extensor mechanism. A fresh blade is used to make a medial parapatellar arthrotomy. Soft tissue over the proximal medial tibia is subperiosteally elevated to the joint line with a knife and into the semimembranosus bursa with a Cobb elevator. Soft tissue over the proximal lateral tibia is elevated with  attention being paid to avoiding the patellar tendon on the tibial tubercle. The patella is everted, knee flexed 90 degrees and the ACL and PCL are removed. Findings are bone on bone medial and patellofemoral with large global osteophytes.        The drill is used to create a starting hole in the distal femur and the canal is thoroughly irrigated with sterile saline to remove the fatty contents. The 5 degree Right  valgus alignment guide is placed into the femoral canal and the distal femoral cutting block is pinned to remove 10 mm off the distal femur. Resection is made with an oscillating saw.      The tibia is subluxed forward and the menisci are removed. The extramedullary alignment guide is placed referencing proximally at the medial aspect of the tibial tubercle and distally along the second metatarsal axis and tibial crest. The block is pinned to remove 28mm off the more deficient medial  side. Resection is made with an oscillating saw. Size 3is the most appropriate size for the tibia and the proximal tibia is prepared with the modular drill and keel punch for that size.      The femoral sizing guide is placed and size 3 is most appropriate. Rotation is marked off the epicondylar axis and confirmed by creating a rectangular flexion gap at 90 degrees. The size 3 cutting block is pinned in this rotation and the anterior, posterior and chamfer cuts are made with the oscillating saw. The intercondylar block is then placed and that cut is made.      Trial size 3 tibial component, trial  size 3 posterior stabilized femur and a 15  mm posterior stabilized rotating platform insert trial is placed. Full extension is achieved with excellent varus/valgus and anterior/posterior balance throughout full range of motion. The patella is everted and thickness measured to be 22  mm. Free hand resection is taken to 12 mm, a 35 template is placed, lug holes are drilled, trial patella is placed, and it tracks normally.  Osteophytes are removed off the posterior femur with the trial in place. All trials are removed and the cut bone surfaces prepared with pulsatile lavage. Cement is mixed and once ready for implantation, the size 3 tibial implant, size  3 posterior stabilized femoral component, and the size 35 patella are cemented in place and the patella is held with the clamp. The trial insert is placed and the knee held in full extension. The Exparel (20 ml mixed with 30 ml saline) and .25% Bupivicaine, are injected into the extensor mechanism, posterior capsule, medial and lateral gutters and subcutaneous tissues.  All extruded cement is removed and once the cement is hard the permanent 15 mm posterior stabilized rotating platform insert is placed into the tibial tray.      The wound is copiously irrigated with saline solution and the extensor mechanism closed over a hemovac drain with #1 V-loc suture. The tourniquet is released for a total tourniquet time of 37  minutes. Flexion against gravity is 130 degrees and the patella tracks normally. Subcutaneous tissue is closed with 2.0 vicryl and subcuticular with running 4.0 Monocryl. The incision is cleaned and dried and steri-strips and a bulky sterile dressing are applied. The limb is placed into a knee immobilizer and the patient is awakened and transported to recovery in stable condition.      Please note that a surgical assistant was a medical necessity for this procedure in order to perform it in a safe and expeditious manner. Surgical assistant was necessary to retract the ligaments and vital neurovascular structures to prevent injury to them and also necessary for proper positioning of the limb to allow for anatomic placement of the prosthesis.   [name], MD    07/03/2014, 1:46 PM

## 2014-07-03 NOTE — Anesthesia Preprocedure Evaluation (Signed)
Anesthesia Evaluation  Patient identified by MRN, date of birth, ID band Patient awake    Reviewed: Allergy & Precautions, H&P , NPO status , Patient's Chart, lab work & pertinent test results  History of Anesthesia Complications (+) PROLONGED EMERGENCE and history of anesthetic complications  Airway Mallampati: II TM Distance: >3 FB Neck ROM: Full    Dental no notable dental hx.    Pulmonary neg pulmonary ROS, sleep apnea ,  breath sounds clear to auscultation  Pulmonary exam normal       Cardiovascular hypertension, Pt. on medications + Valvular Problems/Murmurs Rhythm:Regular Rate:Normal     Neuro/Psych CVA negative psych ROS   GI/Hepatic negative GI ROS, Neg liver ROS,   Endo/Other  Morbid obesity  Renal/GU negative Renal ROS     Musculoskeletal negative musculoskeletal ROS (+)   Abdominal   Peds  Hematology  (+) Blood dyscrasia, anemia ,   Anesthesia Other Findings   Reproductive/Obstetrics negative OB ROS                           Anesthesia Physical Anesthesia Plan  ASA: III  Anesthesia Plan:    Post-op Pain Management:    Induction:   Airway Management Planned:   Additional Equipment:   Intra-op Plan:   Post-operative Plan:   Informed Consent: I have reviewed the patients History and Physical, chart, labs and discussed the procedure including the risks, benefits and alternatives for the proposed anesthesia with the patient or authorized representative who has indicated his/her understanding and acceptance.   Dental advisory given  Plan Discussed with: CRNA  Anesthesia Plan Comments:         Anesthesia Quick Evaluation

## 2014-07-03 NOTE — Transfer of Care (Signed)
Immediate Anesthesia Transfer of Care Note  Patient: Beth [name]  Procedure(s) Performed: Procedure(s): RIGHT TOTAL KNEE ARTHROPLASTY (Right)  Patient Location: PACU  Anesthesia Type:Regional  Level of Consciousness: awake, alert  and oriented  Airway & Oxygen Therapy: Patient Spontanous Breathing and Patient connected to face mask oxygen  Post-op Assessment: Report given to PACU RN and Post -op Vital signs reviewed and stable  Post vital signs: Reviewed and stable  Complications: No apparent anesthesia complications

## 2014-07-04 LAB — BASIC METABOLIC PANEL
Anion gap: 15 (ref 5–15)
BUN: 23 mg/dL (ref 6–23)
CO2: 25 mEq/L (ref 19–32)
CREATININE: 0.81 mg/dL (ref 0.50–1.10)
Calcium: 8.9 mg/dL (ref 8.4–10.5)
Chloride: 95 mEq/L — ABNORMAL LOW (ref 96–112)
GFR calc Af Amer: 83 mL/min — ABNORMAL LOW (ref >90)
GFR, EST NON AFRICAN AMERICAN: 72 mL/min — AB (ref >90)
Glucose, Bld: 252 mg/dL — ABNORMAL HIGH (ref 70–99)
Potassium: 3.5 mEq/L — ABNORMAL LOW (ref 3.7–5.3)
Sodium: 135 mEq/L — ABNORMAL LOW (ref 137–147)

## 2014-07-04 LAB — CBC
HEMATOCRIT: 34.6 % — AB (ref 36.0–46.0)
Hemoglobin: 10.9 g/dL — ABNORMAL LOW (ref 12.0–15.0)
MCH: 25.5 pg — AB (ref 26.0–34.0)
MCHC: 31.5 g/dL (ref 30.0–36.0)
MCV: 80.8 fL (ref 78.0–100.0)
Platelets: 245 10*3/uL (ref 150–400)
RBC: 4.28 MIL/uL (ref 3.87–5.11)
RDW: 14.3 % (ref 11.5–15.5)
WBC: 11.2 10*3/uL — ABNORMAL HIGH (ref 4.0–10.5)

## 2014-07-04 MED ORDER — HYDROCHLOROTHIAZIDE 12.5 MG PO CAPS
12.5000 mg | ORAL_CAPSULE | Freq: Every day | ORAL | Status: DC
  Administered 2014-07-04 – 2014-07-05 (×2): 12.5 mg via ORAL
  Filled 2014-07-04 (×2): qty 1

## 2014-07-04 MED ORDER — IRBESARTAN 300 MG PO TABS
300.0000 mg | ORAL_TABLET | Freq: Every day | ORAL | Status: DC
  Administered 2014-07-04 – 2014-07-05 (×2): 300 mg via ORAL
  Filled 2014-07-04 (×2): qty 1

## 2014-07-04 NOTE — Evaluation (Signed)
Occupational Therapy Evaluation Patient Details Name: Beth [name] MRN: [id number] DOB: 12/24/1945 Today's Date: 07/04/2014    History of Present Illness 68 yo female s/p RTKA 07/03/14.    Clinical Impression   Pt was admitted for the above surgery.  She was mod I with adls prior to surgery and will have assistance from children at home.  Pt will benefit from continued OT in acute to educate on bathroom transfers and to increase independence with adls.  Goals are overall min guard.    Follow Up Recommendations  Supervision/Assistance - 24 hour    Equipment Recommendations  None recommended by OT    Recommendations for Other Services       Precautions / Restrictions Precautions Precautions: Fall Required Braces or Orthoses: Knee Immobilizer - Right Knee Immobilizer - Right: Discontinue once straight leg raise with < 10 degree lag Restrictions Weight Bearing Restrictions: No RLE Weight Bearing: Weight bearing as tolerated      Mobility Bed Mobility Overal bed mobility: Needs Assistance Bed Mobility: Supine to Sit     Supine to sit: Min assist Sit to supine: Min assist   General bed mobility comments: cues for technique and assist for RLE  Transfers Overall transfer level: Needs assistance Equipment used: Rolling walker (2 wheeled) Transfers: Sit to/from Stand Sit to Stand: Min assist         General transfer comment: Assist to rise, stabilize, control descent. VCS safety, technique, hand placement    Balance                                            ADL Overall ADL's : Needs assistance/impaired     Grooming: Set up;Oral care;Sitting   Upper Body Bathing: Set up;Sitting   Lower Body Bathing: Minimal assistance;Sit to/from stand   Upper Body Dressing : Set up;Sitting   Lower Body Dressing: Moderate assistance;Sit to/from stand   Toilet Transfer: Minimal assistance;RW (recliner to bed, took 3 steps)   Toileting- Clothing  Manipulation and Hygiene: Minimal assistance;Sit to/from stand         General ADL Comments: performed ADL from chair.  She will stay at son's initially and he has a walk in shower.  Talked about using her 3:1 as a shower seat.  She is considering a seat or bench for her tub at home:  recommended she let [facility] look at it and make recommendation based on bathroom and how she is doing when she goes back there.  Pt does not have a reacher but children will assist  Explained alternatives to getting leg up on bed.  Plan to show leg lifter vs. Sheet on next visit.     Vision                     Perception     Praxis      Pertinent Vitals/Pain R knee sore; repositioned and ice reapplied     Hand Dominance     Extremity/Trunk Assessment Upper Extremity Assessment Upper Extremity Assessment: Generalized weakness      Cervical / Trunk Assessment Cervical / Trunk Assessment: Normal   Communication Communication Communication: No difficulties   Cognition Arousal/Alertness: Awake/alert Behavior During Therapy: WFL for tasks assessed/performed Overall Cognitive Status: Within Functional Limits for tasks assessed  General Comments       Exercises       Shoulder Instructions      Home Living Family/patient expects to be discharged to:: Private residence Living Arrangements: Children Available Help at Discharge: Family Type of Home: House Home Access: Stairs to enter Technical brewer of Steps: 3 Entrance Stairs-Rails: Right Home Layout: One level     Bathroom Shower/Tub: Walk-in shower;Tub/shower unit (walk in son's house; tub hers)   Biochemist, clinical: Standard     Home Equipment: Environmental consultant - 2 wheels;Toilet riser;Bedside commode          Prior Functioning/Environment Level of Independence: Independent with assistive device(s)             OT Diagnosis: Generalized weakness   OT Problem List: Decreased strength;Decreased  activity tolerance;Pain;Decreased knowledge of use of DME or AE   OT Treatment/Interventions: Self-care/ADL training;DME and/or AE instruction;Patient/family education    OT Goals(Current goals can be found in the care plan section) Acute Rehab OT Goals Patient Stated Goal: home OT Goal Formulation: With patient Time For Goal Achievement: 07/11/14 Potential to Achieve Goals: Good ADL Goals Pt Will Perform Grooming: with supervision;standing Pt Will Transfer to Toilet: with min guard assist;ambulating;bedside commode Pt Will Perform Toileting - Clothing Manipulation and hygiene: with min guard assist;sit to/from stand Pt Will Perform Tub/Shower Transfer: with min guard assist;ambulating;Shower transfer;3 in 1  OT Frequency: Min 2X/week   Barriers to D/C:            Co-evaluation              End of Session CPM Right Knee CPM Right Knee: Off  Activity Tolerance: Patient tolerated treatment well Patient left: in bed;with call bell/phone within reach   Time: 1022-1047 OT Time Calculation (min): 25 min Charges:  OT General Charges $OT Visit: 1 Procedure OT Evaluation $Initial OT Evaluation Tier I: 1 Procedure OT Treatments $Self Care/Home Management : 8-22 mins G-Codes:    Rebecca Cairns 2014/07/26, 11:39 AM   Lesle Chris, OTR/L [phone] 2014/07/26

## 2014-07-04 NOTE — Discharge Instructions (Addendum)
° °Dr. Frank Aluisio °Total Joint Specialist °[facility] °[address] °[place], [place] [address] °[phone] ° °TOTAL KNEE REPLACEMENT POSTOPERATIVE DIRECTIONS ° ° ° °Knee Rehabilitation, Guidelines Following Surgery  °Results after knee surgery are often greatly improved when you follow the exercise, range of motion and muscle strengthening exercises prescribed by your doctor. Safety measures are also important to protect the knee from further injury. Any time any of these exercises cause you to have increased pain or swelling in your knee joint, decrease the amount until you are comfortable again and slowly increase them. If you have problems or questions, call your caregiver or physical therapist for advice.  ° °HOME CARE INSTRUCTIONS  °Remove items at home which could result in a fall. This includes throw rugs or furniture in walking pathways.  °Continue medications as instructed at time of discharge. °You may have some home medications which will be placed on hold until you complete the course of blood thinner medication.  °You may start showering once you are discharged home but do not submerge the incision under water. Just pat the incision dry and apply a dry gauze dressing on daily. °Walk with walker as instructed.  °You may resume a sexual relationship in one month or when given the OK by  your doctor.  °· Use walker as long as suggested by your caregivers. °· Avoid periods of inactivity such as sitting longer than an hour when not asleep. This helps prevent blood clots.  °You may put full weight on your legs and walk as much as is comfortable.  °You may return to work once you are cleared by your doctor.  °Do not drive a car for 6 weeks or until released by you surgeon.  °· Do not drive while taking narcotics.  °Wear the elastic stockings for three weeks following surgery during the day but you may remove then at night. °Make sure you keep all of your appointments after your  operation with all of your doctors and caregivers. You should call the office at the above phone number and make an appointment for approximately two weeks after the date of your surgery. °Change the dressing daily and reapply a dry dressing each time. °Please pick up a stool softener and laxative for home use as long as you are requiring pain medications. °· Continue to use ice on the knee for pain and swelling from surgery. You may notice swelling that will progress down to the foot and ankle.  This is normal after surgery.  Elevate the leg when you are not up walking on it.   °It is important for you to complete the blood thinner medication as prescribed by your doctor. °· Continue to use the breathing machine which will help keep your temperature down.  It is common for your temperature to cycle up and down following surgery, especially at night when you are not up moving around and exerting yourself.  The breathing machine keeps your lungs expanded and your temperature down. ° °RANGE OF MOTION AND STRENGTHENING EXERCISES  °Rehabilitation of the knee is important following a knee injury or an operation. After just a few days of immobilization, the muscles of the thigh which control the knee become weakened and shrink (atrophy). Knee exercises are designed to build up the tone and strength of the thigh muscles and to improve knee motion. Often times heat used for twenty to thirty minutes before working out will loosen up your tissues and help with improving the   range of motion but do not use heat for the first two weeks following surgery. These exercises can be done on a training (exercise) mat, on the floor, on a table or on a bed. Use what ever works the best and is most comfortable for you Knee exercises include:  Leg Lifts - While your knee is still immobilized in a splint or cast, you can do straight leg raises. Lift the leg to 60 degrees, hold for 3 sec, and slowly lower the leg. Repeat 10-20 times 2-3  times daily. Perform this exercise against resistance later as your knee gets better.  Quad and Hamstring Sets - Tighten up the muscle on the front of the thigh (Quad) and hold for 5-10 sec. Repeat this 10-20 times hourly. Hamstring sets are done by pushing the foot backward against an object and holding for 5-10 sec. Repeat as with quad sets.  A rehabilitation program following serious knee injuries can speed recovery and prevent re-injury in the future due to weakened muscles. Contact your doctor or a physical therapist for more information on knee rehabilitation.   SKILLED REHAB INSTRUCTIONS: If the patient is transferred to a skilled rehab facility following release from the hospital, a list of the current medications will be sent to the facility for the patient to continue.  When discharged from the skilled rehab facility, please have the facility set up the patient's [place] prior to being released. Also, the skilled facility will be responsible for providing the patient with their medications at time of release from the facility to include their pain medication, the muscle relaxants, and their blood thinner medication. If the patient is still at the rehab facility at time of the two week follow up appointment, the skilled rehab facility will also need to assist the patient in arranging follow up appointment in our office and any transportation needs.  MAKE SURE YOU:  Understand these instructions.  Will watch your condition.  Will get help right away if you are not doing well or get worse.    Pick up stool softner and laxative for home. Do not submerge incision under water. May shower. Continue to use ice for pain and swelling from surgery.   Take Xarelto for two and a half more weeks, then discontinue Xarelto. Once the patient has completed the blood thinner regimen, then take a Baby 81 mg Aspirin daily for three more weeks.     Information on my medicine -  XARELTO (Rivaroxaban)  This medication education was reviewed with me or my healthcare representative as part of my discharge preparation.  The pharmacist that spoke with me during my hospital stay was:  [facility]  Why was Xarelto prescribed for you? Xarelto was prescribed for you to reduce the risk of blood clots forming after orthopedic surgery. The medical term for these abnormal blood clots is venous thromboembolism (VTE).  What do you need to know about xarelto ? Take your Xarelto ONCE DAILY at the same time every day. You may take it either with or without food.  If you have difficulty swallowing the tablet whole, you may crush it and mix in applesauce just prior to taking your dose.  Take Xarelto exactly as prescribed by your doctor and DO NOT stop taking Xarelto without talking to the doctor who prescribed the medication.  Stopping without other VTE prevention medication to take the place of Xarelto may increase your risk of developing a clot.  After discharge, you should  have regular check-up appointments with your healthcare provider that is prescribing your Xarelto.    What do you do if you miss a dose? If you miss a dose, take it as soon as you remember on the same day then continue your regularly scheduled once daily regimen the next day. Do not take two doses of Xarelto on the same day.   Important Safety Information A possible side effect of Xarelto is bleeding. You should call your healthcare provider right away if you experience any of the following:   Bleeding from an injury or your nose that does not stop.   Unusual colored urine (red or dark brown) or unusual colored stools (red or black).   Unusual bruising for unknown reasons.   A serious fall or if you hit your head (even if there is no bleeding).  Some medicines may interact with Xarelto and might increase your risk of bleeding while on Xarelto. To help avoid this, consult your healthcare  provider or pharmacist prior to using any new prescription or non-prescription medications, including herbals, vitamins, non-steroidal anti-inflammatory drugs (NSAIDs) and supplements.  This website has more information on Xarelto: [email or web address]

## 2014-07-04 NOTE — Progress Notes (Signed)
   Subjective: 1 Day Post-Op Procedure(s) (LRB): RIGHT TOTAL KNEE ARTHROPLASTY (Right) Patient reports pain as mild and moderate.   Patient seen in rounds with Dr. Wynelle Link. Family in room. Patient is well, but has had some minor complaints of pain in the knee, requiring pain medications We will start therapy today.  Plan is to go Home after hospital stay.  Objective: Vital signs in last 24 hours: Temp:  [96.8 F (36 C)-97.9 F (36.6 C)] 97.8 F (36.6 C) (07/28 0230) Pulse Rate:  [59-77] 68 (07/28 0230) Resp:  [15-22] 16 (07/28 0400) BP: (118-153)/(63-96) 133/84 mmHg (07/28 0230) SpO2:  [94 %-100 %] 100 % (07/28 0400) Weight:  [107.049 kg (236 lb)] 107.049 kg (236 lb) (07/27 0958)  Intake/Output from previous day:  Intake/Output Summary (Last 24 hours) at 07/04/14 0716 Last data filed at 07/04/14 0237  Gross per 24 hour  Intake   3315 ml  Output   2130 ml  Net   1185 ml    Intake/Output this shift:    Labs:  Recent Labs  07/04/14 0430  HGB 10.9*    Recent Labs  07/04/14 0430  WBC 11.2*  RBC 4.28  HCT 34.6*  PLT 245    Recent Labs  07/04/14 0430  NA 135*  K 3.5*  CL 95*  CO2 25  BUN 23  CREATININE 0.81  GLUCOSE 252*  CALCIUM 8.9   No results found for this basename: LABPT, INR,  in the last 72 hours  EXAM General - Patient is Alert, Appropriate and Oriented Extremity - Neurovascular intact Sensation intact distally Dorsiflexion/Plantar flexion intact Dressing - dressing C/D/I Motor Function - intact, moving foot and toes well on exam.  Hemovac pulled without difficulty.  Past Medical History  Diagnosis Date  . Anemia   . Hypertension   . CVA (cerebral infarction)     2006  . Hyperlipidemia   . Arthritis   . Blood transfusion without reported diagnosis 2012    anemia  . Heart murmur   . Sleep apnea     no cpap  . Stroke 2006    x 1  . Nocturia     3-4 times per night  . Complication of anesthesia 06-21-14    trouble waking up  after colonscopy  . Family history of anesthesia complication     sister very slow to awaken after anesthesia  . Gout     left elbow    Assessment/Plan: 1 Day Post-Op Procedure(s) (LRB): RIGHT TOTAL KNEE ARTHROPLASTY (Right) Principal Problem:   OA (osteoarthritis) of knee  Estimated body mass index is 39.27 kg/(m^2) as calculated from the following:   Height as of this encounter: 5\' 5"  (1.651 m).   Weight as of this encounter: 107.049 kg (236 lb). Up with therapy Discharge home with home health  DVT Prophylaxis - Xarelto Weight-Bearing as tolerated to right leg D/C O2 and Pulse OX and try on Room Air  [name], PA-C Orthopaedic Surgery 07/04/2014, 7:16 AM

## 2014-07-04 NOTE — Progress Notes (Signed)
CARE MANAGEMENT NOTE 07/04/2014  Patient:  Beth [name], Beth [name]   Account Number:  [id number]  Date Initiated:  07/04/2014  Documentation initiated by:  Jannah Guardiola  Subjective/Objective Assessment:   Pre-operative diagnosis- Osteoarthritis  Right knee(s)  Post-operative diagnosis- Osteoarthritis Right knee(s)  Procedure-  Right  Total Knee Arthroplasty     Action/Plan:   home with hhc and dme/pt states that she has all dme needed   Anticipated DC Date:  07/06/2014   Anticipated DC Plan:  [place] referral  NA      DC Planning Services  CM consult      [facility] Choice  NA   Choice offered to / List presented to:  C-1 Patient        [place] arranged  [facility]      [place].   Status of service:  In process, will continue to follow Medicare Important Message given?  NA - LOS <3 / Initial given by admissions (If response is "NO", the following Medicare IM given date fields will be blank) Date Medicare IM given:   Medicare IM given by:   Date Additional Medicare IM given:   Additional Medicare IM given by:    Discharge Disposition:    Per UR Regulation:  Reviewed for med. necessity/level of care/duration of stay  If discussed at [place] of Stay Meetings, dates discussed:    Comments:  [id number]/Beth [name]: Case Management [phone] Chart reviewed for inpatient need and needs Discharge needs at time of review: none Next chart review due on [id number]. pt will initially go home with her son Beth [name] [place] phone [phone] pt cell [phone]

## 2014-07-04 NOTE — Progress Notes (Signed)
Physical Therapy Treatment Patient Details Name: Beth [name] MRN: [id number] DOB: 1946/03/26 Today's Date: 07/04/2014    History of Present Illness 68 yo female s/p RTKA 07/03/14.     PT Comments    Progressing well with therapy. Will plan to practice steps on tomorrow. Possible d/c per pt.   Follow Up Recommendations  Home health PT;Supervision/Assistance - 24 hour     Equipment Recommendations  None recommended by PT    Recommendations for Other Services OT consult     Precautions / Restrictions Precautions Precautions: Fall Required Braces or Orthoses: Knee Immobilizer - Right Knee Immobilizer - Right: Discontinue once straight leg raise with < 10 degree lag Restrictions Weight Bearing Restrictions: No RLE Weight Bearing: Weight bearing as tolerated    Mobility  Bed Mobility Overal bed mobility: Needs Assistance Bed Mobility: Sit to Supine       Sit to supine: Min assist   General bed mobility comments: cues for technique and assist for RLE  Transfers Overall transfer level: Needs assistance Equipment used: Rolling walker (2 wheeled) Transfers: Sit to/from Stand Sit to Stand: Min assist         General transfer comment: Assist to rise, stabilize, control descent. VCS safety, technique, hand placement  Ambulation/Gait Ambulation/Gait assistance: Min guard Ambulation Distance (Feet): 90 Feet Assistive device: Rolling walker (2 wheeled) Gait Pattern/deviations: Step-to pattern;Trunk flexed;Decreased stride length;Antalgic     General Gait Details: close guard for safety. VCs safety, technique, sequence. Pt fatigues fairly easily.    Stairs            Wheelchair Mobility    Modified Rankin (Stroke Patients Only)       Balance                                    Cognition Arousal/Alertness: Awake/alert Behavior During Therapy: WFL for tasks assessed/performed Overall Cognitive Status: Within Functional Limits for tasks  assessed                      Exercises      General Comments        Pertinent Vitals/Pain 5/10 R knee with activity. Ice applied end of session    Home Living                      Prior Function            PT Goals (current goals can now be found in the care plan section) Progress towards PT goals: Progressing toward goals    Frequency  7X/week    PT Plan Current plan remains appropriate    Co-evaluation             End of Session   Activity Tolerance: Patient tolerated treatment well Patient left: in bed;with call bell/phone within reach;with family/visitor present     Time: 0973-5329 PT Time Calculation (min): 24 min  Charges:  $Gait Training: 23-37 mins                    G Codes:      Weston Anna, MPT Pager: [phone]

## 2014-07-04 NOTE — Evaluation (Signed)
Physical Therapy Evaluation Patient Details Name: Beth [name] MRN: [id number] DOB: Feb 21, 1946 Today's Date: 07/04/2014   History of Present Illness  68 yo female s/p RTKA 07/03/14.   Clinical Impression  On eval, pt required Min assist for mobility-able to ambulate ~50 feet with rolling walker. Distance limited by fatigue. Pt also nauseous this a.m. Plan is for d/c home to son's house with family assisting.     Follow Up Recommendations Home health PT;Supervision/Assistance - 24 hour    Equipment Recommendations  None recommended by PT    Recommendations for Other Services OT consult     Precautions / Restrictions Precautions Precautions: Fall Required Braces or Orthoses: Knee Immobilizer - Right Knee Immobilizer - Right: Discontinue once straight leg raise with < 10 degree lag Restrictions Weight Bearing Restrictions: No RLE Weight Bearing: Weight bearing as tolerated      Mobility  Bed Mobility Overal bed mobility: Needs Assistance Bed Mobility: Supine to Sit     Supine to sit: Min assist     General bed mobility comments: Assist for R LE off bed. Increased time. VCS safety, technique.   Transfers Overall transfer level: Needs assistance Equipment used: Rolling walker (2 wheeled) Transfers: Sit to/from Stand Sit to Stand: Min assist         General transfer comment: Assist to rise, stabilize, control descent. VCS safety, technique, hand placement  Ambulation/Gait Ambulation/Gait assistance: Min assist Ambulation Distance (Feet): 50 Feet Assistive device: Rolling walker (2 wheeled) Gait Pattern/deviations: Step-to pattern;Decreased stride length;Antalgic;Trunk flexed     General Gait Details: Assist to stabilize. VCs safety, technique, sequence. Pt fatigues fairly easily.   Stairs            Wheelchair Mobility    Modified Rankin (Stroke Patients Only)       Balance                                             Pertinent  Vitals/Pain 5/10 R knee with activity. Ice applied end of session    [facility] expects to be discharged to:: Private residence (pt planning to d/c to son's home) Living Arrangements: Children Available Help at Discharge: Family Type of Home: House Home Access: Stairs to enter Entrance Stairs-Rails: Right Entrance Stairs-Number of Steps: 3 Home Layout: One level Home Equipment: Environmental consultant - 2 wheels      Prior Function Level of Independence: Independent with assistive device(s)               Hand Dominance        Extremity/Trunk Assessment   Upper Extremity Assessment: Generalized weakness           Lower Extremity Assessment: RLE deficits/detail RLE Deficits / Details: hip flex 2/5, hip abd/add 2/5, moves ankle well    Cervical / Trunk Assessment: Normal  Communication   Communication: No difficulties  Cognition Arousal/Alertness: Awake/alert Behavior During Therapy: WFL for tasks assessed/performed Overall Cognitive Status: Within Functional Limits for tasks assessed                      General Comments      Exercises Total Joint Exercises Ankle Circles/Pumps: AROM;Both;10 reps;Seated Quad Sets: AROM;10 reps;Both;Seated Heel Slides: AAROM;Right;10 reps;Seated Hip ABduction/ADduction: AAROM;Right;10 reps;Seated Straight Leg Raises: AAROM;Right;10 reps;Seated Goniometric ROM: 10-40 degrees      Assessment/Plan    PT Assessment Patient  needs continued PT services  PT Diagnosis Difficulty walking;Abnormality of gait;Acute pain   PT Problem List Decreased strength;Decreased range of motion;Decreased activity tolerance;Decreased balance;Decreased mobility;Obesity;Pain;Decreased knowledge of use of DME;Decreased knowledge of precautions  PT Treatment Interventions DME instruction;Gait training;Stair training;Functional mobility training;Therapeutic activities;Therapeutic exercise;Patient/family education;Balance training   PT Goals  (Current goals can be found in the Care Plan section) Acute Rehab PT Goals Patient Stated Goal: home PT Goal Formulation: With patient/family Time For Goal Achievement: 07/18/14 Potential to Achieve Goals: Good    Frequency 7X/week   Barriers to discharge        Co-evaluation               End of Session Equipment Utilized During Treatment: Gait belt Activity Tolerance: Patient tolerated treatment well Patient left: in chair;with call bell/phone within reach;with family/visitor present           Time: 4801-6553 PT Time Calculation (min): 21 min   Charges:   PT Evaluation $Initial PT Evaluation Tier I: 1 Procedure PT Treatments $Gait Training: 8-22 mins   PT G Codes:          Weston Anna, MPT Pager: [phone]

## 2014-07-05 LAB — BASIC METABOLIC PANEL
ANION GAP: 12 (ref 5–15)
BUN: 22 mg/dL (ref 6–23)
CALCIUM: 8.9 mg/dL (ref 8.4–10.5)
CO2: 29 mEq/L (ref 19–32)
CREATININE: 0.85 mg/dL (ref 0.50–1.10)
Chloride: 94 mEq/L — ABNORMAL LOW (ref 96–112)
GFR calc Af Amer: 79 mL/min — ABNORMAL LOW (ref >90)
GFR, EST NON AFRICAN AMERICAN: 68 mL/min — AB (ref >90)
GLUCOSE: 238 mg/dL — AB (ref 70–99)
Potassium: 3.4 mEq/L — ABNORMAL LOW (ref 3.7–5.3)
Sodium: 135 mEq/L — ABNORMAL LOW (ref 137–147)

## 2014-07-05 LAB — CBC
HCT: 30.8 % — ABNORMAL LOW (ref 36.0–46.0)
Hemoglobin: 9.8 g/dL — ABNORMAL LOW (ref 12.0–15.0)
MCH: 25.7 pg — AB (ref 26.0–34.0)
MCHC: 31.8 g/dL (ref 30.0–36.0)
MCV: 80.8 fL (ref 78.0–100.0)
PLATELETS: 237 10*3/uL (ref 150–400)
RBC: 3.81 MIL/uL — ABNORMAL LOW (ref 3.87–5.11)
RDW: 14.6 % (ref 11.5–15.5)
WBC: 12 10*3/uL — ABNORMAL HIGH (ref 4.0–10.5)

## 2014-07-05 MED ORDER — RIVAROXABAN 10 MG PO TABS: 10.0000 mg | ORAL_TABLET | Freq: Every day | ORAL | Status: DC

## 2014-07-05 MED ORDER — OXYCODONE HCL 5 MG PO TABS: 5.0000 mg | ORAL_TABLET | ORAL | Status: DC | PRN

## 2014-07-05 MED ORDER — METHOCARBAMOL 500 MG PO TABS: 500.0000 mg | ORAL_TABLET | Freq: Four times a day (QID) | ORAL | Status: DC | PRN

## 2014-07-05 MED ORDER — PROMETHAZINE HCL 25 MG PO TABS: 12.5000 mg | ORAL_TABLET | Freq: Four times a day (QID) | ORAL | Status: DC | PRN

## 2014-07-05 MED ORDER — PROMETHAZINE HCL 25 MG PO TABS: ORAL_TABLET | ORAL | Status: DC

## 2014-07-05 NOTE — Progress Notes (Signed)
   Subjective: 10 Days Post-Op Procedure(s) (LRB): RIGHT TOTAL KNEE ARTHROPLASTY (Right) Patient reports pain as mild.   Patient seen in rounds for Dr. Wynelle Link. Patient is well, but has had some minor complaints of pain in the knee, requiring pain medications Patient is ready to go home  Objective: Vital signs in last 24 hours:    Intake/Output from previous day: No intake or output data in the 24 hours ending 07/13/14 0911  Intake/Output this shift:    Labs: No results found for this basename: HGB,  in the last 72 hours No results found for this basename: WBC, RBC, HCT, PLT,  in the last 72 hours No results found for this basename: NA, K, CL, CO2, BUN, CREATININE, GLUCOSE, CALCIUM,  in the last 72 hours No results found for this basename: LABPT, INR,  in the last 72 hours  EXAM: General - Patient is Alert and Appropriate Extremity - Neurovascular intact Sensation intact distally Incision - clean, dry, no drainage Motor Function - intact, moving foot and toes well on exam.   Assessment/Plan: 10 Days Post-Op Procedure(s) (LRB): RIGHT TOTAL KNEE ARTHROPLASTY (Right) Procedure(s) (LRB): RIGHT TOTAL KNEE ARTHROPLASTY (Right) Past Medical History  Diagnosis Date  . Anemia   . Hypertension   . CVA (cerebral infarction)     2006  . Hyperlipidemia   . Arthritis   . Blood transfusion without reported diagnosis 2012    anemia  . Heart murmur   . Sleep apnea     no cpap  . Stroke 2006    x 1  . Nocturia     3-4 times per night  . Complication of anesthesia 06-21-14    trouble waking up after colonscopy  . Family history of anesthesia complication     sister very slow to awaken after anesthesia  . Gout     left elbow   Principal Problem:   OA (osteoarthritis) of knee  Estimated body mass index is 39.27 kg/(m^2) as calculated from the following:   Height as of this encounter: 5\' 5"  (1.651 m).   Weight as of this encounter: 107.049 kg (236 lb). Up with  therapy Discharge home with home health Diet - Cardiac diet Follow up - in 2 weeks Activity - WBAT Disposition - Home Condition Upon Discharge - Good D/C Meds - See DC Summary DVT Prophylaxis - Xarelto  [name], PA-C Orthopaedic Surgery 07/13/2014, 9:11 AM

## 2014-07-05 NOTE — Progress Notes (Signed)
Physical Therapy Treatment Patient Details Name: Beth [name] MRN: [id number] DOB: Apr 18, 1946 Today's Date: 07/05/2014    History of Present Illness 68 yo female s/p RTKA 07/03/14.     PT Comments    Progressing with mobility. Practiced ambulation, exercises, stair negotiation. Discussed car transfer. All education completed. Ready to d/c from PT standpoint.   Follow Up Recommendations  Home health PT;Supervision/Assistance - 24 hour     Equipment Recommendations  None recommended by PT    Recommendations for Other Services OT consult     Precautions / Restrictions Precautions Precautions: Fall Required Braces or Orthoses: Knee Immobilizer - Right Knee Immobilizer - Right: Discontinue once straight leg raise with < 10 degree lag Restrictions Weight Bearing Restrictions: No RLE Weight Bearing: Weight bearing as tolerated    Mobility  Bed Mobility              Transfers Overall transfer level: Needs assistance Equipment used: Rolling walker (2 wheeled) Transfers: Sit to/from Stand Sit to Stand: Min guard         General transfer comment: close guard for safety. Increased time. VCs safety, technique, hand placement.   Ambulation/Gait Ambulation/Gait assistance: Min guard Ambulation Distance (Feet): 95 Feet Assistive device: Rolling walker (2 wheeled) Gait Pattern/deviations: Step-to pattern;Wide base of support;Antalgic;Trunk flexed     General Gait Details: close guard for safety. VCs safety, technique, sequence. Pt fatigues fairly easily.    Stairs Stairs: Yes Stairs assistance: Min assist Stair Management: Step to pattern;Forwards;With crutches;One rail Right Number of Stairs: 4 General stair comments: Min assist to steady. VCs safety, technique, hand placement, sequence. Practiced 2x2 on portable steps. daughter present to observe. Issued stair negotiation handout as well  Wheelchair Mobility    Modified Rankin (Stroke Patients Only)        Balance                                    Cognition Arousal/Alertness: Awake/alert Behavior During Therapy: WFL for tasks assessed/performed Overall Cognitive Status: Within Functional Limits for tasks assessed                      Exercises Total Joint Exercises Ankle Circles/Pumps: AROM;Both;10 reps;Seated Quad Sets: AROM;10 reps;Both;Seated Heel Slides: AAROM;Right;10 reps;Seated Hip ABduction/ADduction: AAROM;Right;10 reps;Seated Straight Leg Raises: AAROM;Right;10 reps;Seated Goniometric ROM: 10-40 degrees    General Comments        Pertinent Vitals/Pain 5-6/10 R knee with activity. Ice applied end of session    Home Living                      Prior Function            PT Goals (current goals can now be found in the care plan section) Progress towards PT goals: Progressing toward goals    Frequency  7X/week    PT Plan Current plan remains appropriate    Co-evaluation             End of Session Equipment Utilized During Treatment: Gait belt Activity Tolerance: Patient tolerated treatment well Patient left: in chair;with call bell/phone within reach;with family/visitor present     Time: 1030-1109 PT Time Calculation (min): 39 min  Charges:  $Gait Training: 23-37 mins $Therapeutic Exercise: 8-22 mins                    G Codes:  Weston Anna, MPT Pager: [phone]

## 2014-07-05 NOTE — Discharge Summary (Signed)
Physician Discharge Summary   Patient ID: Beth [name] MRN: [id number] DOB/AGE: [birth year] 68 y.o.  Admit date: 07/03/2014 Discharge date: 07/05/2014  Primary Diagnosis:  Osteoarthritis Right knee  Admission Diagnoses:  Past Medical History  Diagnosis Date  . Anemia   . Hypertension   . CVA (cerebral infarction)     2006  . Hyperlipidemia   . Arthritis   . Blood transfusion without reported diagnosis 2012    anemia  . Heart murmur   . Sleep apnea     no cpap  . Stroke 2006    x 1  . Nocturia     3-4 times per night  . Complication of anesthesia 06-21-14    trouble waking up after colonscopy  . Family history of anesthesia complication     sister very slow to awaken after anesthesia  . Gout     left elbow   Discharge Diagnoses:   Principal Problem:   OA (osteoarthritis) of knee  Estimated body mass index is 39.27 kg/(m^2) as calculated from the following:   Height as of this encounter: 5' 5"  (1.651 m).   Weight as of this encounter: 107.049 kg (236 lb).  Procedure:  Procedure(s) (LRB): RIGHT TOTAL KNEE ARTHROPLASTY (Right)   Consults: None  HPI: Beth [name] is a 68 y.o. year old female with end stage OA of her right knee with progressively worsening pain and dysfunction. She has constant pain, with activity and at rest and significant functional deficits with difficulties even with ADLs. She has had extensive non-op management including analgesics, injections of cortisone and viscosupplements, and home exercise program, but remains in significant pain with significant dysfunction.Radiographs show bone on bone arthritis medial and aptellofemoral. She presents now for right Total Knee Arthroplasty.   Laboratory Data: Admission on 07/03/2014, Discharged on 07/05/2014  Component Date Value Ref Range Status  . ABO/RH(D) 07/03/2014 O POS   Final  . Antibody Screen 07/03/2014 NEG   Final  . Sample Expiration 07/03/2014 07/06/2014   Final  . ABO/RH(D) 07/03/2014 O POS    Final  . WBC 07/04/2014 11.2* 4.0 - 10.5 K/uL Final  . RBC 07/04/2014 4.28  3.87 - 5.11 MIL/uL Final  . Hemoglobin 07/04/2014 10.9* 12.0 - 15.0 g/dL Final  . HCT 07/04/2014 34.6* 36.0 - 46.0 % Final  . MCV 07/04/2014 80.8  78.0 - 100.0 fL Final  . MCH 07/04/2014 25.5* 26.0 - 34.0 pg Final  . MCHC 07/04/2014 31.5  30.0 - 36.0 g/dL Final  . RDW 07/04/2014 14.3  11.5 - 15.5 % Final  . Platelets 07/04/2014 245  150 - 400 K/uL Final  . Sodium 07/04/2014 135* 137 - 147 mEq/L Final  . Potassium 07/04/2014 3.5* 3.7 - 5.3 mEq/L Final  . Chloride 07/04/2014 95* 96 - 112 mEq/L Final  . CO2 07/04/2014 25  19 - 32 mEq/L Final  . Glucose, Bld 07/04/2014 252* 70 - 99 mg/dL Final  . BUN 07/04/2014 23  6 - 23 mg/dL Final  . Creatinine, Ser 07/04/2014 0.81  0.50 - 1.10 mg/dL Final  . Calcium 07/04/2014 8.9  8.4 - 10.5 mg/dL Final  . GFR calc non Af Amer 07/04/2014 72* >90 mL/min Final  . GFR calc Af Amer 07/04/2014 83* >90 mL/min Final   Comment: (NOTE)                          The eGFR has been calculated using the CKD EPI equation.  This calculation has not been validated in all clinical situations.                          eGFR's persistently <90 mL/min signify possible Chronic Kidney                          Disease.  . Anion gap 07/04/2014 15  5 - 15 Final  . WBC 07/05/2014 12.0* 4.0 - 10.5 K/uL Final  . RBC 07/05/2014 3.81* 3.87 - 5.11 MIL/uL Final  . Hemoglobin 07/05/2014 9.8* 12.0 - 15.0 g/dL Final  . HCT 07/05/2014 30.8* 36.0 - 46.0 % Final  . MCV 07/05/2014 80.8  78.0 - 100.0 fL Final  . MCH 07/05/2014 25.7* 26.0 - 34.0 pg Final  . MCHC 07/05/2014 31.8  30.0 - 36.0 g/dL Final  . RDW 07/05/2014 14.6  11.5 - 15.5 % Final  . Platelets 07/05/2014 237  150 - 400 K/uL Final  . Sodium 07/05/2014 135* 137 - 147 mEq/L Final  . Potassium 07/05/2014 3.4* 3.7 - 5.3 mEq/L Final  . Chloride 07/05/2014 94* 96 - 112 mEq/L Final  . CO2 07/05/2014 29  19 - 32 mEq/L Final  .  Glucose, Bld 07/05/2014 238* 70 - 99 mg/dL Final  . BUN 07/05/2014 22  6 - 23 mg/dL Final  . Creatinine, Ser 07/05/2014 0.85  0.50 - 1.10 mg/dL Final  . Calcium 07/05/2014 8.9  8.4 - 10.5 mg/dL Final  . GFR calc non Af Amer 07/05/2014 68* >90 mL/min Final  . GFR calc Af Amer 07/05/2014 79* >90 mL/min Final   Comment: (NOTE)                          The eGFR has been calculated using the CKD EPI equation.                          This calculation has not been validated in all clinical situations.                          eGFR's persistently <90 mL/min signify possible Chronic Kidney                          Disease.  Georgiann Hahn gap 07/05/2014 12  5 - 15 Final  Hospital Outpatient Visit on 06/27/2014  Component Date Value Ref Range Status  . Color, Urine 06/27/2014 YELLOW  YELLOW Final  . APPearance 06/27/2014 CLOUDY* CLEAR Final  . Specific Gravity, Urine 06/27/2014 1.015  1.005 - 1.030 Final  . pH 06/27/2014 6.0  5.0 - 8.0 Final  . Glucose, UA 06/27/2014 NEGATIVE  NEGATIVE mg/dL Final  . Hgb urine dipstick 06/27/2014 NEGATIVE  NEGATIVE Final  . Bilirubin Urine 06/27/2014 NEGATIVE  NEGATIVE Final  . Ketones, ur 06/27/2014 NEGATIVE  NEGATIVE mg/dL Final  . Protein, ur 06/27/2014 30* NEGATIVE mg/dL Final  . Urobilinogen, UA 06/27/2014 0.2  0.0 - 1.0 mg/dL Final  . Nitrite 06/27/2014 NEGATIVE  NEGATIVE Final  . Leukocytes, UA 06/27/2014 MODERATE* NEGATIVE Final  . MRSA, PCR 06/27/2014 NEGATIVE  NEGATIVE Final  . Staphylococcus aureus 06/27/2014 NEGATIVE  NEGATIVE Final   Comment:  The Xpert SA Assay (FDA                          approved for NASAL specimens                          in patients over 32 years of age),                          is one component of                          a comprehensive surveillance                          program.  Test performance has                          been validated by [employer] for  patients greater                          than or equal to 68 year old.                          It is not intended                          to diagnose infection nor to                          guide or monitor treatment.  Marland Kitchen aPTT 06/27/2014 32  24 - 37 seconds Final  . WBC 06/27/2014 8.2  4.0 - 10.5 K/uL Final  . RBC 06/27/2014 5.06  3.87 - 5.11 MIL/uL Final  . Hemoglobin 06/27/2014 13.1  12.0 - 15.0 g/dL Final  . HCT 06/27/2014 40.7  36.0 - 46.0 % Final  . MCV 06/27/2014 80.4  78.0 - 100.0 fL Final  . MCH 06/27/2014 25.9* 26.0 - 34.0 pg Final  . MCHC 06/27/2014 32.2  30.0 - 36.0 g/dL Final  . RDW 06/27/2014 14.5  11.5 - 15.5 % Final  . Platelets 06/27/2014 308  150 - 400 K/uL Final  . Sodium 06/27/2014 140  137 - 147 mEq/L Final  . Potassium 06/27/2014 3.6* 3.7 - 5.3 mEq/L Final  . Chloride 06/27/2014 97  96 - 112 mEq/L Final  . CO2 06/27/2014 30  19 - 32 mEq/L Final  . Glucose, Bld 06/27/2014 103* 70 - 99 mg/dL Final  . BUN 06/27/2014 22  6 - 23 mg/dL Final  . Creatinine, Ser 06/27/2014 0.95  0.50 - 1.10 mg/dL Final  . Calcium 06/27/2014 10.1  8.4 - 10.5 mg/dL Final  . Total Protein 06/27/2014 8.1  6.0 - 8.3 g/dL Final  . Albumin 06/27/2014 4.1  3.5 - 5.2 g/dL Final  . AST 06/27/2014 15  0 - 37 U/L Final  . ALT 06/27/2014 14  0 - 35 U/L Final  . Alkaline Phosphatase 06/27/2014 122* 39 - 117 U/L Final  . Total Bilirubin 06/27/2014 0.9  0.3 - 1.2 mg/dL Final  . GFR  calc non Af Amer 06/27/2014 59* >90 mL/min Final  . GFR calc Af Amer 06/27/2014 69* >90 mL/min Final   Comment: (NOTE)                          The eGFR has been calculated using the CKD EPI equation.                          This calculation has not been validated in all clinical situations.                          eGFR's persistently <90 mL/min signify possible Chronic Kidney                          Disease.  . Anion gap 06/27/2014 13  5 - 15 Final  . Prothrombin Time 06/27/2014 13.9  11.6 - 15.2 seconds Final  .  INR 06/27/2014 1.07  0.00 - 1.49 Final  . Squamous Epithelial / LPF 06/27/2014 FEW* RARE Final  . WBC, UA 06/27/2014 7-10  <3 WBC/hpf Final  . Bacteria, UA 06/27/2014 RARE  RARE Final  . Casts 06/27/2014 HYALINE CASTS* NEGATIVE Final     X-Rays:No results found.  EKG: Orders placed in visit on 05/31/14  . EKG 12-LEAD     Hospital Course: Beth [name] is a 68 y.o. who was admitted to [facility]. They were brought to the operating room on 07/03/2014 and underwent Procedure(s): RIGHT TOTAL KNEE ARTHROPLASTY.  Patient tolerated the procedure well and was later transferred to the recovery room and then to the orthopaedic floor for postoperative care.  They were given PO and IV analgesics for pain control following their surgery.  They were given 24 hours of postoperative antibiotics of  Anti-infectives   Start     Dose/Rate Route Frequency Ordered Stop   07/03/14 2000  ceFAZolin (ANCEF) IVPB 2 g/50 mL premix     2 g 100 mL/hr over 30 Minutes Intravenous Every 6 hours 07/03/14 1554 07/04/14 0307   07/03/14 1001  ceFAZolin (ANCEF) IVPB 2 g/50 mL premix     2 g 100 mL/hr over 30 Minutes Intravenous On call to O.R. 07/03/14 1001 07/03/14 1247     and started on DVT prophylaxis in the form of Xarelto.   PT and OT were ordered for total joint protocol.  Discharge planning consulted to help with postop disposition and equipment needs.  Patient had a decent night on the evening of surgery.  They started to get up OOB with therapy on day one. Hemovac drain was pulled without difficulty.  Continued to work with therapy into day two.  Dressing was changed on day two and the incision was healing well.  Patient was seen in rounds and was ready to go home later that day.  Discharge home with home health  Diet - Cardiac diet  Follow up - in 2 weeks  Activity - WBAT  Disposition - Home  Condition Upon Discharge - Good  D/C Meds - See DC Summary  DVT Prophylaxis - Xarelto        Discharge  Instructions   Call MD / Call 911    Complete by:  As directed   If you experience chest pain or shortness of breath, CALL 911 and be transported to the hospital emergency room.  If you develope  a fever above 101 F, pus (white drainage) or increased drainage or redness at the wound, or calf pain, call your surgeon's office.     Change dressing    Complete by:  As directed   Change dressing daily with sterile 4 x 4 inch gauze dressing and apply TED hose. Do not submerge the incision under water.     Constipation Prevention    Complete by:  As directed   Drink plenty of fluids.  Prune juice may be helpful.  You may use a stool softener, such as Colace (over the counter) 100 mg twice a day.  Use MiraLax (over the counter) for constipation as needed.     Diet - low sodium heart healthy    Complete by:  As directed      Diet Carb Modified    Complete by:  As directed      Discharge instructions    Complete by:  As directed   Pick up stool softner and laxative for home. Do not submerge incision under water. May shower. Continue to use ice for pain and swelling from surgery.  Take Xarelto for two and a half more weeks, then discontinue Xarelto. Once the patient has completed the blood thinner regimen, then take a Baby 81 mg Aspirin daily for three more weeks.     Do not put a pillow under the knee. Place it under the heel.    Complete by:  As directed      Do not sit on low chairs, stoools or toilet seats, as it may be difficult to get up from low surfaces    Complete by:  As directed      Driving restrictions    Complete by:  As directed   No driving until released by the physician.     Increase activity slowly as tolerated    Complete by:  As directed      Lifting restrictions    Complete by:  As directed   No lifting until released by the physician.     Patient may shower    Complete by:  As directed   You may shower without a dressing once there is no drainage.  Do not wash over the  wound.  If drainage remains, do not shower until drainage stops.     TED hose    Complete by:  As directed   Use stockings (TED hose) for 3 weeks on both leg(s).  You may remove them at night for sleeping.     Weight bearing as tolerated    Complete by:  As directed             Medication List    STOP taking these medications       CENTRUM SILVER PO     COD LIVER OIL PO     Fish Oil 300 MG Caps     VITAMIN D-3 PO      TAKE these medications       amLODipine 10 MG tablet  Commonly known as:  NORVASC  Take 10 mg by mouth every evening.     atorvastatin 20 MG tablet  Commonly known as:  LIPITOR  Take 20 mg by mouth every evening.     furosemide 80 MG tablet  Commonly known as:  LASIX  Take 80 mg by mouth every other day.     methocarbamol 500 MG tablet  Commonly known as:  ROBAXIN  Take 1 tablet (500 mg total) by  mouth every 6 (six) hours as needed for muscle spasms.     nebivolol 10 MG tablet  Commonly known as:  BYSTOLIC  Take 10 mg by mouth every morning.     olmesartan-hydrochlorothiazide 40-12.5 MG per tablet  Commonly known as:  BENICAR HCT  Take 1 tablet by mouth every morning.     oxyCODONE 5 MG immediate release tablet  Commonly known as:  Oxy IR/ROXICODONE  Take 1-2 tablets (5-10 mg total) by mouth every 3 (three) hours as needed for moderate pain, severe pain or breakthrough pain.     promethazine 25 MG tablet  Commonly known as:  PHENERGAN  1/2 -1 tablets po q 6 hours prn nausea/vomiting.     rivaroxaban 10 MG Tabs tablet  Commonly known as:  XARELTO  - Take 1 tablet (10 mg total) by mouth daily with breakfast. Take Xarelto for two and a half more weeks, then discontinue Xarelto.  - Once the patient has completed the blood thinner regimen, then take a Baby 81 mg Aspirin daily for three more weeks.     sodium chloride 0.65 % Soln nasal spray  Commonly known as:  OCEAN  Place 1 spray into both nostrils 2 (two) times daily as needed for  congestion.       Follow-up Information   Follow up with Gearlean Alf, MD. Schedule an appointment as soon as possible for a visit on 07/18/2014. (Call office today for appointment time.)    Specialty:  Orthopedic Surgery   Contact information:   [address] [place] [address] [phone]       Signed: [name], PA-C Orthopaedic Surgery 07/13/2014, 9:12 AM

## 2014-07-05 NOTE — Progress Notes (Signed)
Rn reviewed discharge instructions with patient and family.   Paperwork and prescriptions given.   All questions answered.  NT rolled patient down in wheelchair to family car.

## 2014-07-05 NOTE — Progress Notes (Signed)
Occupational Therapy Treatment Patient Details Name: Beth [name] MRN: [id number] DOB: 03-29-1946 Today's Date: 07/05/2014    History of present illness 68 yo female s/p RTKA 07/03/14.    OT comments  Pt is making good progress.  She needs min A to stand and cues for sequence when walking.  Daughter present for session  Follow Up Recommendations  Supervision/Assistance - 24 hour    Equipment Recommendations  None recommended by OT    Recommendations for Other Services      Precautions / Restrictions Precautions Precautions: Fall Required Braces or Orthoses: Knee Immobilizer - Right Knee Immobilizer - Right: Discontinue once straight leg raise with < 10 degree lag Restrictions Weight Bearing Restrictions: No RLE Weight Bearing: Weight bearing as tolerated       Mobility Bed Mobility           Sit to supine: Min assist   General bed mobility comments: used leg lifter with min A.  Cues for technique  Transfers     Transfers: Sit to/from Stand Sit to Stand: Min assist         General transfer comment: assist to power up and steady    Balance                                   ADL Overall ADL's : Needs assistance/impaired                         Toilet Transfer: Minimal assistance;RW;Ambulation (min For sit to stand and mod cues for sequencing)   Toileting- Clothing Manipulation and Hygiene: Minimal assistance;Sit to/from stand   Tub/ Shower Transfer: Walk-in shower;Minimal assistance;Ambulation     General ADL Comments: daughter present:  educated on [place] and she observed transfers. She will help pt with adls this am      Vision                     Perception     Praxis      Cognition   Behavior During Therapy: [facility] for tasks assessed/performed Overall Cognitive Status: Within Functional Limits for tasks assessed                       Extremity/Trunk Assessment               Exercises      Shoulder Instructions       General Comments      Pertinent Vitals/ Pain       Sore R knee and thigh. Repositioned--not ready for ice getting ready to bathe  Home Living                                          Prior Functioning/Environment              Frequency Min 2X/week     Progress Toward Goals  OT Goals(current goals can now be found in the care plan section)  Progress towards OT goals: Progressing toward goals     Plan      Co-evaluation                 End of Session CPM Right Knee CPM Right Knee: Off   Activity Tolerance Patient tolerated treatment well  Patient Left  (on commode with daughter in room; daughter will assist with adls)   Nurse Communication   .       Time: 8676-7209 OT Time Calculation (min): 20 min  Charges: OT General Charges $OT Visit: 1 Procedure OT Treatments $Self Care/Home Management : 8-22 mins  Euna Armon 07/05/2014, 10:01 AM Lesle Chris, OTR/L [phone] 07/05/2014

## 2014-07-12 ENCOUNTER — Other Ambulatory Visit (HOSPITAL_COMMUNITY): Payer: Self-pay | Admitting: Physician Assistant

## 2014-07-12 ENCOUNTER — Ambulatory Visit (HOSPITAL_COMMUNITY)
Admission: RE | Admit: 2014-07-12 | Discharge: 2014-07-12 | Disposition: A | Discharge status: Home / Self Care | Payer: Medicare Other | Source: Ambulatory Visit | Attending: Orthopedic Surgery | Admitting: Orthopedic Surgery

## 2014-07-12 DIAGNOSIS — M7989 Other specified soft tissue disorders: Secondary | ICD-10-CM | POA: Diagnosis present

## 2014-07-12 DIAGNOSIS — M79609 Pain in unspecified limb: Secondary | ICD-10-CM | POA: Diagnosis not present

## 2014-07-12 DIAGNOSIS — Z96659 Presence of unspecified artificial knee joint: Secondary | ICD-10-CM | POA: Insufficient documentation

## 2014-07-12 DIAGNOSIS — M79661 Pain in right lower leg: Secondary | ICD-10-CM

## 2014-07-12 NOTE — Progress Notes (Signed)
*  Preliminary Results* Right lower extremity venous duplex completed. Visualized veins of the right lower extremity are negative for deep vein thrombosis. There is no evidence of right Baker's cyst.  07/12/2014 4:01 PM  Maudry Mayhew, RVT, RDCS, RDMS

## 2014-08-09 DIAGNOSIS — B009 Herpesviral infection, unspecified: Secondary | ICD-10-CM

## 2014-08-09 HISTORY — DX: Herpesviral infection, unspecified: B00.9

## 2014-08-10 ENCOUNTER — Other Ambulatory Visit: Payer: Self-pay | Admitting: Orthopedic Surgery

## 2014-08-10 ENCOUNTER — Encounter (HOSPITAL_COMMUNITY): Payer: Self-pay | Admitting: *Deleted

## 2014-08-10 NOTE — Progress Notes (Signed)
Preoperative surgical orders have been place into the Epic hospital system for Beth [name] on 08/10/2014, 4:12 PM  by Mickel Crow for surgery on 08/11/2014.  Preop Patella Tendon orders including IV Tylenol and IV Decadron as long as there are no contraindications to the above medications. [name], PA-C

## 2014-08-10 NOTE — Progress Notes (Addendum)
Called Dr. Anne Fu office to get message to him or MD on call the information re: the herpes of the right eye that was not mentioned during the office visit.  She saw her eye doctor yesterday.    It was mentioned when I asked was it anything new going with her health history that I needed to put into her record.    I stated I would make the doctor aware to the patient.

## 2014-08-11 ENCOUNTER — Ambulatory Visit (HOSPITAL_COMMUNITY): Payer: Medicare Other | Admitting: Anesthesiology

## 2014-08-11 ENCOUNTER — Encounter (HOSPITAL_COMMUNITY)
Admission: RE | Disposition: A | Discharge status: Home / Self Care | Payer: Self-pay | Source: Ambulatory Visit | Attending: Orthopedic Surgery

## 2014-08-11 ENCOUNTER — Inpatient Hospital Stay (HOSPITAL_COMMUNITY)
Admission: RE | Admit: 2014-08-11 | Discharge: 2014-08-14 | DRG: 502 | Disposition: A | Discharge status: Home / Self Care | Payer: Medicare Other | Source: Ambulatory Visit | Attending: Orthopedic Surgery | Admitting: Orthopedic Surgery

## 2014-08-11 ENCOUNTER — Encounter (HOSPITAL_COMMUNITY): Payer: Medicare Other | Admitting: Anesthesiology

## 2014-08-11 ENCOUNTER — Encounter (HOSPITAL_COMMUNITY): Payer: Self-pay | Admitting: *Deleted

## 2014-08-11 DIAGNOSIS — I10 Essential (primary) hypertension: Secondary | ICD-10-CM | POA: Diagnosis present

## 2014-08-11 DIAGNOSIS — S838X9A Sprain of other specified parts of unspecified knee, initial encounter: Principal | ICD-10-CM | POA: Diagnosis present

## 2014-08-11 DIAGNOSIS — Z96659 Presence of unspecified artificial knee joint: Secondary | ICD-10-CM | POA: Diagnosis not present

## 2014-08-11 DIAGNOSIS — G4730 Sleep apnea, unspecified: Secondary | ICD-10-CM | POA: Diagnosis present

## 2014-08-11 DIAGNOSIS — Z7982 Long term (current) use of aspirin: Secondary | ICD-10-CM | POA: Diagnosis not present

## 2014-08-11 DIAGNOSIS — S86811A Strain of other muscle(s) and tendon(s) at lower leg level, right leg, initial encounter: Secondary | ICD-10-CM

## 2014-08-11 DIAGNOSIS — M109 Gout, unspecified: Secondary | ICD-10-CM | POA: Diagnosis present

## 2014-08-11 DIAGNOSIS — W19XXXA Unspecified fall, initial encounter: Secondary | ICD-10-CM | POA: Diagnosis present

## 2014-08-11 DIAGNOSIS — Z79899 Other long term (current) drug therapy: Secondary | ICD-10-CM | POA: Diagnosis not present

## 2014-08-11 DIAGNOSIS — E785 Hyperlipidemia, unspecified: Secondary | ICD-10-CM | POA: Diagnosis present

## 2014-08-11 DIAGNOSIS — Z8673 Personal history of transient ischemic attack (TIA), and cerebral infarction without residual deficits: Secondary | ICD-10-CM | POA: Diagnosis not present

## 2014-08-11 DIAGNOSIS — S86819A Strain of other muscle(s) and tendon(s) at lower leg level, unspecified leg, initial encounter: Secondary | ICD-10-CM | POA: Diagnosis present

## 2014-08-11 DIAGNOSIS — M25569 Pain in unspecified knee: Secondary | ICD-10-CM | POA: Diagnosis present

## 2014-08-11 HISTORY — DX: Herpesviral infection, unspecified: B00.9

## 2014-08-11 HISTORY — PX: PATELLAR TENDON REPAIR: SHX737

## 2014-08-11 LAB — BASIC METABOLIC PANEL
Anion gap: 16 — ABNORMAL HIGH (ref 5–15)
BUN: 16 mg/dL (ref 6–23)
CO2: 27 mEq/L (ref 19–32)
Calcium: 9.9 mg/dL (ref 8.4–10.5)
Chloride: 94 mEq/L — ABNORMAL LOW (ref 96–112)
Creatinine, Ser: 1.21 mg/dL — ABNORMAL HIGH (ref 0.50–1.10)
GFR calc Af Amer: 51 mL/min — ABNORMAL LOW (ref >90)
GFR calc non Af Amer: 44 mL/min — ABNORMAL LOW (ref >90)
GLUCOSE: 151 mg/dL — AB (ref 70–99)
POTASSIUM: 3.8 meq/L (ref 3.7–5.3)
SODIUM: 137 meq/L (ref 137–147)

## 2014-08-11 LAB — CBC
HCT: 36.6 % (ref 36.0–46.0)
HEMOGLOBIN: 11.5 g/dL — AB (ref 12.0–15.0)
MCH: 24.8 pg — ABNORMAL LOW (ref 26.0–34.0)
MCHC: 31.4 g/dL (ref 30.0–36.0)
MCV: 79 fL (ref 78.0–100.0)
Platelets: 526 10*3/uL — ABNORMAL HIGH (ref 150–400)
RBC: 4.63 MIL/uL (ref 3.87–5.11)
RDW: 15.8 % — AB (ref 11.5–15.5)
WBC: 9.9 10*3/uL (ref 4.0–10.5)

## 2014-08-11 SURGERY — REPAIR, TENDON, PATELLAR
Anesthesia: Spinal | Laterality: Right

## 2014-08-11 MED ORDER — MEPERIDINE HCL 50 MG/ML IJ SOLN: 6.2500 mg | INTRAMUSCULAR | Status: DC | PRN

## 2014-08-11 MED ORDER — METHOCARBAMOL 500 MG PO TABS
ORAL_TABLET | ORAL | Status: AC
  Administered 2014-08-12: 500 mg via ORAL
  Filled 2014-08-11: qty 1

## 2014-08-11 MED ORDER — FENTANYL CITRATE 0.05 MG/ML IJ SOLN
INTRAMUSCULAR | Status: DC | PRN
  Administered 2014-08-11: 50 ug via INTRAVENOUS

## 2014-08-11 MED ORDER — ACETAMINOPHEN 10 MG/ML IV SOLN
1000.0000 mg | Freq: Once | INTRAVENOUS | Status: AC
  Administered 2014-08-11: 1000 mg via INTRAVENOUS
  Filled 2014-08-11: qty 100

## 2014-08-11 MED ORDER — CEFAZOLIN SODIUM-DEXTROSE 2-3 GM-% IV SOLR
2.0000 g | INTRAVENOUS | Status: AC
  Administered 2014-08-11: 2 g via INTRAVENOUS

## 2014-08-11 MED ORDER — PROPOFOL 10 MG/ML IV BOLUS
INTRAVENOUS | Status: AC
  Filled 2014-08-11: qty 20

## 2014-08-11 MED ORDER — SALINE SPRAY 0.65 % NA SOLN
1.0000 | Freq: Two times a day (BID) | NASAL | Status: DC | PRN
  Filled 2014-08-11: qty 44

## 2014-08-11 MED ORDER — MORPHINE SULFATE 2 MG/ML IJ SOLN
1.0000 mg | INTRAMUSCULAR | Status: DC | PRN
  Administered 2014-08-11 – 2014-08-12 (×3): 1 mg via INTRAVENOUS
  Filled 2014-08-11 (×3): qty 1

## 2014-08-11 MED ORDER — PROMETHAZINE HCL 25 MG/ML IJ SOLN: 6.2500 mg | INTRAMUSCULAR | Status: DC | PRN

## 2014-08-11 MED ORDER — ONDANSETRON HCL 4 MG/2ML IJ SOLN
INTRAMUSCULAR | Status: DC | PRN
  Administered 2014-08-11: 4 mg via INTRAVENOUS

## 2014-08-11 MED ORDER — FENTANYL CITRATE 0.05 MG/ML IJ SOLN
INTRAMUSCULAR | Status: AC
  Filled 2014-08-11: qty 2

## 2014-08-11 MED ORDER — MIDAZOLAM HCL 5 MG/5ML IJ SOLN
INTRAMUSCULAR | Status: DC | PRN
  Administered 2014-08-11: 2 mg via INTRAVENOUS

## 2014-08-11 MED ORDER — PHENYLEPHRINE 40 MCG/ML (10ML) SYRINGE FOR IV PUSH (FOR BLOOD PRESSURE SUPPORT)
PREFILLED_SYRINGE | INTRAVENOUS | Status: AC
  Filled 2014-08-11: qty 10

## 2014-08-11 MED ORDER — ONDANSETRON HCL 4 MG PO TABS: 4.0000 mg | ORAL_TABLET | Freq: Four times a day (QID) | ORAL | Status: DC | PRN

## 2014-08-11 MED ORDER — AMLODIPINE BESYLATE 10 MG PO TABS
10.0000 mg | ORAL_TABLET | Freq: Every evening | ORAL | Status: DC
  Administered 2014-08-11 – 2014-08-13 (×3): 10 mg via ORAL
  Filled 2014-08-11 (×4): qty 1

## 2014-08-11 MED ORDER — FUROSEMIDE 80 MG PO TABS
80.0000 mg | ORAL_TABLET | ORAL | Status: DC
  Administered 2014-08-12 – 2014-08-14 (×2): 80 mg via ORAL
  Filled 2014-08-11 (×2): qty 1

## 2014-08-11 MED ORDER — BISACODYL 10 MG RE SUPP: 10.0000 mg | Freq: Every day | RECTAL | Status: DC | PRN

## 2014-08-11 MED ORDER — SODIUM CHLORIDE 0.9 % IV SOLN
INTRAVENOUS | Status: DC
Start: 2014-08-11 — End: 2014-08-11
  Administered 2014-08-11: 21:00:00 via INTRAVENOUS

## 2014-08-11 MED ORDER — CHLORHEXIDINE GLUCONATE 4 % EX LIQD: 60.0000 mL | Freq: Once | CUTANEOUS | Status: DC

## 2014-08-11 MED ORDER — DEXAMETHASONE SODIUM PHOSPHATE 10 MG/ML IJ SOLN
INTRAMUSCULAR | Status: AC
  Filled 2014-08-11: qty 1

## 2014-08-11 MED ORDER — LACTATED RINGERS IV SOLN
INTRAVENOUS | Status: DC
  Administered 2014-08-11: 1000 mL via INTRAVENOUS

## 2014-08-11 MED ORDER — MIDAZOLAM HCL 2 MG/2ML IJ SOLN
INTRAMUSCULAR | Status: AC
  Filled 2014-08-11: qty 2

## 2014-08-11 MED ORDER — CEFAZOLIN SODIUM-DEXTROSE 2-3 GM-% IV SOLR
2.0000 g | Freq: Four times a day (QID) | INTRAVENOUS | Status: AC
  Administered 2014-08-11 – 2014-08-13 (×8): 2 g via INTRAVENOUS
  Filled 2014-08-11 (×8): qty 50

## 2014-08-11 MED ORDER — LIDOCAINE HCL (CARDIAC) 20 MG/ML IV SOLN
INTRAVENOUS | Status: AC
  Filled 2014-08-11: qty 5

## 2014-08-11 MED ORDER — CEFAZOLIN SODIUM-DEXTROSE 2-3 GM-% IV SOLR
INTRAVENOUS | Status: AC
  Filled 2014-08-11: qty 50

## 2014-08-11 MED ORDER — HYDROCHLOROTHIAZIDE 12.5 MG PO CAPS
12.5000 mg | ORAL_CAPSULE | Freq: Every day | ORAL | Status: DC
  Administered 2014-08-12 – 2014-08-14 (×3): 12.5 mg via ORAL
  Filled 2014-08-11 (×3): qty 1

## 2014-08-11 MED ORDER — ONDANSETRON HCL 4 MG/2ML IJ SOLN
INTRAMUSCULAR | Status: AC
  Filled 2014-08-11: qty 2

## 2014-08-11 MED ORDER — PROPOFOL INFUSION 10 MG/ML OPTIME
INTRAVENOUS | Status: DC | PRN
  Administered 2014-08-11: 100 ug/kg/min via INTRAVENOUS

## 2014-08-11 MED ORDER — FENTANYL CITRATE 0.05 MG/ML IJ SOLN
25.0000 ug | INTRAMUSCULAR | Status: DC | PRN
  Administered 2014-08-11 (×3): 50 ug via INTRAVENOUS

## 2014-08-11 MED ORDER — ATORVASTATIN CALCIUM 20 MG PO TABS
20.0000 mg | ORAL_TABLET | Freq: Every evening | ORAL | Status: DC
  Administered 2014-08-11 – 2014-08-13 (×3): 20 mg via ORAL
  Filled 2014-08-11 (×4): qty 1

## 2014-08-11 MED ORDER — LIDOCAINE IN DEXTROSE 5-7.5 % IV SOLN
INTRAVENOUS | Status: AC
  Filled 2014-08-11: qty 2

## 2014-08-11 MED ORDER — OLMESARTAN MEDOXOMIL-HCTZ 40-12.5 MG PO TABS: 1.0000 | ORAL_TABLET | Freq: Every morning | ORAL | Status: DC

## 2014-08-11 MED ORDER — LIDOCAINE IN DEXTROSE 5-7.5 % IV SOLN
INTRAVENOUS | Status: DC | PRN
  Administered 2014-08-11: 70 mg via INTRATHECAL

## 2014-08-11 MED ORDER — SODIUM CHLORIDE 0.9 % IR SOLN
Status: DC | PRN
  Administered 2014-08-11: 3000 mL

## 2014-08-11 MED ORDER — METOCLOPRAMIDE HCL 10 MG PO TABS: 5.0000 mg | ORAL_TABLET | Freq: Three times a day (TID) | ORAL | Status: DC | PRN

## 2014-08-11 MED ORDER — PHENYLEPHRINE HCL 10 MG/ML IJ SOLN
INTRAMUSCULAR | Status: DC | PRN
  Administered 2014-08-11: 80 ug via INTRAVENOUS

## 2014-08-11 MED ORDER — OXYCODONE HCL 5 MG PO TABS
5.0000 mg | ORAL_TABLET | ORAL | Status: DC | PRN
  Administered 2014-08-11 – 2014-08-14 (×13): 10 mg via ORAL
  Filled 2014-08-11 (×13): qty 2

## 2014-08-11 MED ORDER — ROCURONIUM BROMIDE 100 MG/10ML IV SOLN
INTRAVENOUS | Status: AC
  Filled 2014-08-11: qty 1

## 2014-08-11 MED ORDER — POLYETHYLENE GLYCOL 3350 17 G PO PACK
17.0000 g | PACK | Freq: Every day | ORAL | Status: DC | PRN
Start: 2014-08-11 — End: 2014-08-14
  Administered 2014-08-12: 17 g via ORAL

## 2014-08-11 MED ORDER — METHOCARBAMOL 500 MG PO TABS
500.0000 mg | ORAL_TABLET | Freq: Four times a day (QID) | ORAL | Status: DC | PRN
  Administered 2014-08-11 – 2014-08-13 (×6): 500 mg via ORAL
  Filled 2014-08-11 (×5): qty 1

## 2014-08-11 MED ORDER — DOCUSATE SODIUM 100 MG PO CAPS
100.0000 mg | ORAL_CAPSULE | Freq: Two times a day (BID) | ORAL | Status: DC
  Administered 2014-08-12 – 2014-08-14 (×5): 100 mg via ORAL

## 2014-08-11 MED ORDER — SODIUM CHLORIDE 0.9 % IV SOLN
INTRAVENOUS | Status: DC
  Administered 2014-08-11 – 2014-08-13 (×2): via INTRAVENOUS

## 2014-08-11 MED ORDER — DEXAMETHASONE SODIUM PHOSPHATE 10 MG/ML IJ SOLN
10.0000 mg | Freq: Once | INTRAMUSCULAR | Status: AC
  Administered 2014-08-11: 10 mg via INTRAVENOUS

## 2014-08-11 MED ORDER — FLEET ENEMA 7-19 GM/118ML RE ENEM: 1.0000 | ENEMA | Freq: Once | RECTAL | Status: AC | PRN

## 2014-08-11 MED ORDER — NEBIVOLOL HCL 10 MG PO TABS
10.0000 mg | ORAL_TABLET | Freq: Every morning | ORAL | Status: DC
  Administered 2014-08-12 – 2014-08-14 (×3): 10 mg via ORAL
  Filled 2014-08-11 (×3): qty 1

## 2014-08-11 MED ORDER — METOCLOPRAMIDE HCL 5 MG/ML IJ SOLN: 5.0000 mg | Freq: Three times a day (TID) | INTRAMUSCULAR | Status: DC | PRN

## 2014-08-11 MED ORDER — IRBESARTAN 300 MG PO TABS
300.0000 mg | ORAL_TABLET | Freq: Every day | ORAL | Status: DC
  Administered 2014-08-12 – 2014-08-14 (×3): 300 mg via ORAL
  Filled 2014-08-11 (×3): qty 1

## 2014-08-11 MED ORDER — ONDANSETRON HCL 4 MG/2ML IJ SOLN: 4.0000 mg | Freq: Four times a day (QID) | INTRAMUSCULAR | Status: DC | PRN

## 2014-08-11 MED ORDER — FENTANYL CITRATE 0.05 MG/ML IJ SOLN
25.0000 ug | INTRAMUSCULAR | Status: DC | PRN
  Administered 2014-08-11 (×4): 50 ug via INTRAVENOUS

## 2014-08-11 SURGICAL SUPPLY — 52 items
ANCH SUT 2 CP-2 EBND QANCHR+ (Anchor) ×2 IMPLANT
ANCHOR SUPER QUICK (Anchor) ×4 IMPLANT
BAG SPEC THK2 15X12 ZIP CLS (MISCELLANEOUS)
BAG ZIPLOCK 12X15 (MISCELLANEOUS) IMPLANT
BANDAGE ELASTIC 6 VELCRO ST LF (GAUZE/BANDAGES/DRESSINGS) ×3 IMPLANT
BANDAGE ESMARK 6X9 LF (GAUZE/BANDAGES/DRESSINGS) ×1 IMPLANT
BIT DRILL 2.4X128 (BIT) ×2 IMPLANT
BIT DRILL 2.4X128MM (BIT) ×1
BNDG CMPR 9X6 STRL LF SNTH (GAUZE/BANDAGES/DRESSINGS) ×1
BNDG ESMARK 6X9 LF (GAUZE/BANDAGES/DRESSINGS) ×3
CLOSURE WOUND 1/2 X4 (GAUZE/BANDAGES/DRESSINGS) ×2
CUFF TOURN SGL QUICK 34 (TOURNIQUET CUFF)
CUFF TOURN SGL QUICK 44 (TOURNIQUET CUFF) ×2 IMPLANT
CUFF TRNQT CYL 34X4X40X1 (TOURNIQUET CUFF) ×1 IMPLANT
DRAPE INCISE IOBAN 66X45 STRL (DRAPES) ×2 IMPLANT
DRAPE POUCH INSTRU U-SHP 10X18 (DRAPES) ×3 IMPLANT
DRAPE U-SHAPE 47X51 STRL (DRAPES) ×3 IMPLANT
DRSG ADAPTIC 3X8 NADH LF (GAUZE/BANDAGES/DRESSINGS) ×3 IMPLANT
DRSG PAD ABDOMINAL 8X10 ST (GAUZE/BANDAGES/DRESSINGS) ×3 IMPLANT
DURAPREP 26ML APPLICATOR (WOUND CARE) ×3 IMPLANT
ELECT REM PT RETURN 9FT ADLT (ELECTROSURGICAL) ×3
ELECTRODE REM PT RTRN 9FT ADLT (ELECTROSURGICAL) ×1 IMPLANT
GAUZE SPONGE 4X4 12PLY STRL (GAUZE/BANDAGES/DRESSINGS) ×3 IMPLANT
GLOVE BIO SURGEON STRL SZ7.5 (GLOVE) ×5 IMPLANT
GLOVE BIO SURGEON STRL SZ8 (GLOVE) ×3 IMPLANT
GLOVE BIOGEL PI IND STRL 8 (GLOVE) ×2 IMPLANT
GLOVE BIOGEL PI INDICATOR 8 (GLOVE) ×4
GOWN STRL REUS W/TWL LRG LVL3 (GOWN DISPOSABLE) ×3 IMPLANT
GOWN STRL REUS W/TWL XL LVL3 (GOWN DISPOSABLE) ×5 IMPLANT
HANDPIECE INTERPULSE COAX TIP (DISPOSABLE) ×3
IMMOBILIZER KNEE 20 (SOFTGOODS) ×3
IMMOBILIZER KNEE 20 THIGH 36 (SOFTGOODS) ×1 IMPLANT
MANIFOLD NEPTUNE II (INSTRUMENTS) ×3 IMPLANT
NDL MA TROC 1/2 (NEEDLE) ×1 IMPLANT
NEEDLE MA TROC 1/2 (NEEDLE) ×3 IMPLANT
NEEDLE MAYO .5 CIRCLE (NEEDLE) IMPLANT
PACK TOTAL JOINT (CUSTOM PROCEDURE TRAY) ×3 IMPLANT
PADDING CAST COTTON 6X4 STRL (CAST SUPPLIES) ×8 IMPLANT
PASSER SUT SWANSON 36MM LOOP (INSTRUMENTS) ×3 IMPLANT
POSITIONER SURGICAL ARM (MISCELLANEOUS) ×3 IMPLANT
SET HNDPC FAN SPRY TIP SCT (DISPOSABLE) IMPLANT
STAPLER VISISTAT 35W (STAPLE) ×2 IMPLANT
STRIP CLOSURE SKIN 1/2X4 (GAUZE/BANDAGES/DRESSINGS) ×4 IMPLANT
SUT ETHIBOND NAB CT1 #1 30IN (SUTURE) ×4 IMPLANT
SUT FIBERWIRE #2 38 T-5 BLUE (SUTURE) ×6
SUT MNCRL AB 4-0 PS2 18 (SUTURE) ×3 IMPLANT
SUT VIC AB 1 CT1 27 (SUTURE) ×9
SUT VIC AB 1 CT1 27XBRD ANTBC (SUTURE) IMPLANT
SUT VIC AB 2-0 CT1 27 (SUTURE) ×12
SUT VIC AB 2-0 CT1 TAPERPNT 27 (SUTURE) ×2 IMPLANT
SUTURE FIBERWR #2 38 T-5 BLUE (SUTURE) ×2 IMPLANT
TOWEL OR 17X26 10 PK STRL BLUE (TOWEL DISPOSABLE) ×3 IMPLANT

## 2014-08-11 NOTE — Brief Op Note (Signed)
08/11/2014  6:59 PM  PATIENT:  Beth [name]  68 y.o. female  PRE-OPERATIVE DIAGNOSIS:  RIGHT PATELLA TENDON TEAR  POST-OPERATIVE DIAGNOSIS:  right patella tendon tear  PROCEDURE:  Procedure(s): RIGHT PATELLA TENDON REPAIR (Right)  SURGEON:  Surgeon(s) and Role:    * Gearlean Alf, MD - Primary  PHYSICIAN ASSISTANT:   ASSISTANTS: [name], PA-C   ANESTHESIA:   spinal  EBL:     BLOOD ADMINISTERED:none  DRAINS: none   LOCAL MEDICATIONS USED:  NONE  COUNTS:  YES  TOURNIQUET:   Total Tourniquet Time Documented: Thigh (Right) - 61 minutes Total: Thigh (Right) - 61 minutes   DICTATION: .Other Dictation: Dictation Number [id number]  PLAN OF CARE: Admit to inpatient   PATIENT DISPOSITION:  PACU - hemodynamically stable.

## 2014-08-11 NOTE — H&P (Signed)
Patient ID: Beth [name] MRN: [id number] DOB/AGE: 12-Mar-1946 68 y.o.  Admit date: 08/11/2014  Chief Complaint:  right knee pain.  Subjective: Patient is admitted for right patella tendon repair  Patient is a 68 y.o. female who has been seen by Dr. Wynelle Link for knee issues.  They have been followed over the past two weeks. She was seen about a week and a half ago in the office following a fall with some slight drainage from the distal incision.  She was placed into a knee immobilizer at that time and then followed up yesterday.  She was seen in the office on yesterday and found to have two pinpoint drainage areas from the lower incision and the inability to do a SLR.  The patella was riding high consistent with a patella tendon rupture.  She was seen and felt that she would require surgical intervention for the tendon rupture.  Risks and benefits have been discussed with the patient and they elect to proceed with surgery.  The patient has no contraindications to the upcoming procedure such as ongoing infection or progressive neurological disease.  Allergies: Allergies  Allergen Reactions  . Clonidine Derivatives Other (See Comments)    Dizziness and weakness  . Shellfish Allergy Nausea And Vomiting     Medications: Prescriptions prior to admission  Medication Sig Dispense Refill  . amLODipine (NORVASC) 10 MG tablet Take 10 mg by mouth every evening.      Marland Kitchen aspirin 81 MG chewable tablet Chew 81 mg by mouth daily.      Marland Kitchen atorvastatin (LIPITOR) 20 MG tablet Take 20 mg by mouth every evening.      . cephALEXin (KEFLEX) 500 MG capsule Take 500 mg by mouth 2 (two) times daily. For 7 days      . furosemide (LASIX) 80 MG tablet Take 80 mg by mouth every other day.       . methocarbamol (ROBAXIN) 500 MG tablet Take 500 mg by mouth every 6 (six) hours as needed for muscle spasms.      . nebivolol (BYSTOLIC) 10 MG tablet Take 10 mg by mouth every morning.      . olmesartan-hydrochlorothiazide  (BENICAR HCT) 40-12.5 MG per tablet Take 1 tablet by mouth every morning.      Marland Kitchen oxyCODONE (OXY IR/ROXICODONE) 5 MG immediate release tablet Take 5-10 mg by mouth every 3 (three) hours as needed for severe pain.      . sodium chloride (OCEAN) 0.65 % SOLN nasal spray Place 1 spray into both nostrils 2 (two) times daily as needed for congestion.        Past Medical History: Past Medical History  Diagnosis Date  . Hypertension   . CVA (cerebral infarction)     2006  . Hyperlipidemia   . Blood transfusion without reported diagnosis 2012    anemia  . Heart murmur   . Stroke 2006    x 1  . Nocturia     3-4 times per night  . Complication of anesthesia 06-21-14    trouble waking up after colonscopy  . Family history of anesthesia complication     sister very slow to awaken after anesthesia  . Gout     left elbow  . Sleep apnea     no cpap  . Arthritis     Knee both knees  . Anemia   . 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 08-09-14 Right eye     Past Surgical History: Past  Surgical History  Procedure Laterality Date  . Carpal tunnel release Right 1983  . Abdominal hysterectomy  1983  . Colonscopy  June 21, 2014  . Nasal cauterization  2012  . Total knee arthroplasty Right 07/03/2014    Procedure: RIGHT TOTAL KNEE ARTHROPLASTY;  Surgeon: Gearlean Alf, MD;  Location: [facility] ORS;  Service: Orthopedics;  Laterality: Right;     Family History: Family History  Problem Relation Age of Onset  . Brain cancer Mother   . Ovarian cancer Mother   . Diabetes Son   . Diabetes Son   . Colon cancer Neg Hx   . Esophageal cancer Neg Hx   . Stomach cancer Neg Hx   . Rectal cancer Neg Hx     Social History: History  Substance Use Topics  . Smoking status: Never Smoker   . Smokeless tobacco: Never Used  . Alcohol Use: No     Review of Systems Constitutional: negative Respiratory: negative Cardiovascular: negative Gastrointestinal:  negative Genitourinary:negative Musculoskeletal:positive for knee pain and weakness  Physical Exam:  Vitals: Pulse:88 Respirations:18 Blood Pressure:142/82 Temp:98.1  General: alert and cooperative HENT:Head: Normal, normocephalic, atraumatic. Neck:supple Chest:clear to auscultation, no wheezes, rales or rhonchi, symmetric air entry Heart:regular rate and rhythm, Systolic murmur III/VI Abdomen:abdomen soft and non-tender Rectal/Breast/Genitalia: Not done, not pertinent to present illness. Musculoskeletal:weakness noted in the right knee with failure to perform a SLR.  LABS: Results for orders placed during the hospital encounter of 08/11/14  CBC      Result Value Ref Range   WBC 9.9  4.0 - 10.5 K/uL   RBC 4.63  3.87 - 5.11 MIL/uL   Hemoglobin 11.5 (*) 12.0 - 15.0 g/dL   HCT 36.6  36.0 - 46.0 %   MCV 79.0  78.0 - 100.0 fL   MCH 24.8 (*) 26.0 - 34.0 pg   MCHC 31.4  30.0 - 36.0 g/dL   RDW 15.8 (*) 11.5 - 15.5 %   Platelets 526 (*) 150 - 400 K/uL  BASIC METABOLIC PANEL      Result Value Ref Range   Sodium 137  137 - 147 mEq/L   Potassium 3.8  3.7 - 5.3 mEq/L   Chloride 94 (*) 96 - 112 mEq/L   CO2 27  19 - 32 mEq/L   Glucose, Bld 151 (*) 70 - 99 mg/dL   BUN 16  6 - 23 mg/dL   Creatinine, Ser 1.21 (*) 0.50 - 1.10 mg/dL   Calcium 9.9  8.4 - 10.5 mg/dL   GFR calc non Af Amer 44 (*) >90 mL/min   GFR calc Af Amer 51 (*) >90 mL/min   Anion gap 16 (*) 5 - 15    Recent Labs  08/11/14 1230  HGB 11.5*    Recent Labs  08/11/14 1230  WBC 9.9  RBC 4.63  HCT 36.6  PLT 526*    Recent Labs  08/11/14 1230  NA 137  K 3.8  CL 94*  CO2 27  BUN 16  CREATININE 1.21*  GLUCOSE 151*  CALCIUM 9.9   No results found for this basename: LABPT, INR,  in the last 72 hours  Assessment/Plan: Right Patella Tendon Rupture  The patient is being admitted to [facility] to undergo a right patella tendon repair.  Surgery will be performed by Dr. [name].  Risks and  benefits have been discussed with the patient and they elect to proceed wth the procedure.  [name], PA-C

## 2014-08-11 NOTE — Op Note (Signed)
NAMEADITHI, Beth [name]                  ACCOUNT NO.:  [id number]  MEDICAL RECORD NO.:  [id number]  LOCATION:  5035                         FACILITY:  [facility]  PHYSICIAN:  [name], M.D.    DATE OF BIRTH:  August 25, 1946  DATE OF PROCEDURE:  08/11/2014 DATE OF DISCHARGE:                              OPERATIVE REPORT   PREOPERATIVE DIAGNOSIS:  Right patella tendon rupture.  POSTOPERATIVE DIAGNOSIS:  Right patella tendon rupture.  PROCEDURE:  Right patella tendon repair.  SURGEON:  [name], M.D.  ASSISTANT:  Alexzandrew L. Perkins, PA-C.  ANESTHESIA:  Spinal.  ESTIMATED BLOOD LOSS:  Minimal.  DRAINS:  None.  TOURNIQUET TIME:  61 minutes at 300 mmHg.  COMPLICATIONS:  None.  CONDITION:  Stable to recovery.  BRIEF CLINICAL NOTE:  Ms. Drummer is a 68 year old female, had a right total knee arthroplasty done approximately 6-8 weeks ago.  She had a fall about a week to week and a half ago and was placed into a knee immobilizer.  She hyperflexed the knee.  She came back in for followup yesterday.  She had developed some bloody drainage from the inferior aspect of the incision.  When I saw her yesterday, she is unable to extend the knee and her exam was consistent with patella tendon rupture. She presents now for patella tendon repair.  PROCEDURE IN DETAIL:  After successful administration of spinal anesthetic, the patient was placed supine on the operating table and  a tourniquet placed high on her right thigh.  Right lower extremity was isolated from her perineum with plastic drapes and prepped and draped in the usual sterile fashion.  Previous incisions were utilized.  Skin cut with a 10 blade and the subcutaneous tissue.  The area where she had a small drainage was ellipsed out.  The subcu was dissected down to the tendon.  There is rupture of the patella tendon off the tibial tubercle. She also had intrasubstance tear of the patella tendon.  Given that there was a small  opening which communicated with the joint, I thoroughly irrigated the joint with 3 L of saline using pulsatile lavage.  Fortunately, the tissue has had a healing appearance and did not appear infected.  The intrasubstance tear of the tendon was then fixed with interrupted #1 Ethibond suture.  We then weaved two #1 FiberWire sutures.  One at the medial aspect of the tendon, one to the lateral aspect of the tendon starting at the inferior pole of the patella and coursing all the way down to the free edge of the tendon. We then created a trough in the adjacent to the tibial tubercle and then drilled a hole from distal to proximal, and I was able to pass the sutures through those 2 holes.  We tied the sutures together with a very stable repair.  I was able to flex it down to about 70 degrees without any excessive tension on the repair.  I then placed 2 Mitek anchor sutures and passed those through the tissue that was overlying the tendon and sewed over the tendon and then tied those sutures back to the suture used for the repair of the tendon.  This was essentially a pants- over-vest repair.  We then thoroughly irrigated further with saline.  We then closed the subcu tissue with interrupted 2-0 Vicryl, appears rather tight subcu closure where we were able to approximate the tissue well. I had ellipsed out that previous open area which made a little more difficult to get the subcu closed, but I was able to close it without any excess tension on the wound.  We then closed the skin with staples. Tourniquet release total time of 61 minutes.  We did not identify any bleeding.  The bulky sterile dressing was applied and she was placed into a knee immobilizer, awakened and transported to recovery in stable condition.     [name], M.D.     FA/MEDQ  D:  08/11/2014  T:  08/11/2014  Job:  [id number]

## 2014-08-11 NOTE — Anesthesia Postprocedure Evaluation (Signed)
  Anesthesia Post-op Note  Patient: Beth [name]  Procedure(s) Performed: Procedure(s): RIGHT PATELLA TENDON REPAIR (Right)  Patient is awake, responsive, moving her legs, and has signs of resolution of her numbness. Pain and nausea are reasonably well controlled. Vital signs are stable and clinically acceptable. Oxygen saturation is clinically acceptable. There are no apparent anesthetic complications at this time. Patient is ready for discharge.

## 2014-08-11 NOTE — Anesthesia Procedure Notes (Signed)
Spinal  Patient location during procedure: OR Start time: 08/11/2014 5:27 PM Staffing Anesthesiologist: Montez Hageman CRNA/Resident: [place] Ashlyn Cabler C Performed by: resident/CRNA  Preanesthetic Checklist Completed: patient identified, site marked, surgical consent, pre-op evaluation, timeout performed, IV checked, risks and benefits discussed and monitors and equipment checked Spinal Block Patient position: sitting Prep: Betadine, site prepped and draped and DuraPrep Patient monitoring: heart rate, cardiac monitor, continuous pulse ox and blood pressure Approach: midline Location: L3-4 Injection technique: single-shot Needle Needle type: Pencil-Tip and Sprotte  Needle gauge: 24 G Needle length: 10 cm Assessment Sensory level: T6

## 2014-08-11 NOTE — Anesthesia Preprocedure Evaluation (Signed)
Anesthesia Evaluation  Patient identified by MRN, date of birth, ID band Patient awake    Reviewed: Allergy & Precautions, H&P , NPO status , Patient's Chart, lab work & pertinent test results  History of Anesthesia Complications (+) PROLONGED EMERGENCE and history of anesthetic complications  Airway Mallampati: II TM Distance: >3 FB Neck ROM: Full    Dental no notable dental hx.    Pulmonary neg pulmonary ROS, sleep apnea ,  breath sounds clear to auscultation  Pulmonary exam normal       Cardiovascular hypertension, Pt. on medications + Valvular Problems/Murmurs Rhythm:Regular Rate:Normal     Neuro/Psych CVA negative psych ROS   GI/Hepatic negative GI ROS, Neg liver ROS,   Endo/Other  Morbid obesity  Renal/GU negative Renal ROS     Musculoskeletal negative musculoskeletal ROS (+)   Abdominal   Peds  Hematology  (+) Blood dyscrasia, anemia ,   Anesthesia Other Findings   Reproductive/Obstetrics negative OB ROS                           Anesthesia Physical  Anesthesia Plan  ASA: III  Anesthesia Plan: Spinal   Post-op Pain Management:    Induction:   Airway Management Planned: Simple Face Mask  Additional Equipment:   Intra-op Plan:   Post-operative Plan:   Informed Consent: I have reviewed the patients History and Physical, chart, labs and discussed the procedure including the risks, benefits and alternatives for the proposed anesthesia with the patient or authorized representative who has indicated his/her understanding and acceptance.   Dental advisory given  Plan Discussed with: CRNA  Anesthesia Plan Comments:         Anesthesia Quick Evaluation

## 2014-08-11 NOTE — Interval H&P Note (Signed)
History and Physical Interval Note:  08/11/2014 4:51 PM  Beth [name]  has presented today for surgery, with the diagnosis of RIGHT PATELLA TENDON TEAR  The various methods of treatment have been discussed with the patient and family. After consideration of risks, benefits and other options for treatment, the patient has consented to  Procedure(s): [place] (Right) as a surgical intervention .  The patient's history has been reviewed, patient examined, no change in status, stable for surgery.  I have reviewed the patient's chart and labs.  Questions were answered to the patient's satisfaction.     Gearlean Alf

## 2014-08-11 NOTE — Transfer of Care (Signed)
Immediate Anesthesia Transfer of Care Note  Patient: Beth [name]  Procedure(s) Performed: Procedure(s): RIGHT PATELLA TENDON REPAIR (Right)  Patient Location: PACU  Anesthesia Type:Spinal  Level of Consciousness: awake, alert  and oriented  Airway & Oxygen Therapy: Patient Spontanous Breathing and Patient connected to face mask oxygen  Post-op Assessment: Report given to PACU RN and Post -op Vital signs reviewed and stable  Post vital signs: Reviewed and stable  Complications: No apparent anesthesia complications

## 2014-08-12 LAB — GLUCOSE, CAPILLARY: GLUCOSE-CAPILLARY: 181 mg/dL — AB (ref 70–99)

## 2014-08-12 NOTE — Progress Notes (Signed)
     Subjective: 1 Day Post-Op Procedure(s) (LRB): RIGHT PATELLA TENDON REPAIR (Right)   Patient reports pain as mild, pain controlled. No events throughout the night. Discussed the importance of trying to keep the leg straight to not stress the surgical repair.  Objective:   VITALS:   Filed Vitals:   08/12/14 0550  BP: 154/86  Pulse: 88  Temp: 97.7 F (36.5 C)  Resp: 20    Dorsiflexion/Plantar flexion intact Incision: dressing C/D/I No cellulitis present Compartment soft  LABS  Recent Labs  08/11/14 1230  HGB 11.5*  HCT 36.6  WBC 9.9  PLT 526*     Recent Labs  08/11/14 1230  NA 137  K 3.8  BUN 16  CREATININE 1.21*  GLUCOSE 151*     Assessment/Plan: 1 Day Post-Op Procedure(s) (LRB): RIGHT PATELLA TENDON REPAIR (Right)  Up with therapy Original dressing maintained as surgery was done later yesterday, no drainage on inspection. Dressing can be changed on Sunday Plan would be to see how she does throughout the weekend     [place]. Jakelyn Squyres   PAC  08/12/2014, 8:47 AM

## 2014-08-12 NOTE — Evaluation (Signed)
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  Pt should progress to d/c home with family assist and possible HHPT dependent on acute stay progress.    Follow Up Recommendations Home health PT    Equipment Recommendations  None recommended by PT    Recommendations for Other Services OT consult     Precautions / Restrictions Precautions Precautions: Knee;Fall;Other (comment) (NO ROM at R KNEE) Required Braces or Orthoses: Knee Immobilizer - Right Knee Immobilizer - Right: On at all times Restrictions Weight Bearing Restrictions: No Other Position/Activity Restrictions: WBAT      Mobility  Bed Mobility Overal bed mobility: Needs Assistance Bed Mobility: Supine to Sit     Supine to sit: Mod assist     General bed mobility comments: cues for sequence and use of L LE to self assistq  Transfers Overall transfer level: Needs assistance Equipment used: Rolling walker (2 wheeled) Transfers: Sit to/from Stand Sit to Stand: From elevated surface;Mod assist         General transfer comment: cues for LE management and use of UEs to self assist  Ambulation/Gait Ambulation/Gait assistance: Min assist;Mod assist Ambulation Distance (Feet): 28 Feet Assistive device: Rolling walker (2 wheeled) Gait Pattern/deviations: Step-to pattern;Decreased step length - right;Decreased step length - left;Shuffle;Trunk flexed Gait velocity: decr   General Gait Details: cues for posture, sequence, stride length and position from [employer]            Wheelchair Mobility    Modified Rankin (Stroke Patients Only)       Balance                                              Pertinent Vitals/Pain Pain Assessment: 0-10 Pain Score: 3  Pain Location: R knee Pain Descriptors / Indicators: Tingling;Burning Pain Intervention(s): Limited activity within patient's tolerance;Monitored during session;Premedicated before session;Ice applied    Home Living Family/patient expects to be discharged to:: Private residence Living Arrangements: Alone Available Help at Discharge: Family Type of Home: House Home Access: Stairs to enter Entrance Stairs-Rails: Right Entrance Stairs-Number of Steps: 4 Home Layout: One level Home Equipment: Environmental consultant - 2 wheels;Toilet riser;Bedside commode;Crutches Additional Comments: Initially going to son's home    Prior Function Level of Independence: Independent with assistive device(s)               Hand Dominance        Extremity/Trunk Assessment   Upper Extremity Assessment: Overall WFL for tasks assessed           Lower Extremity Assessment: RLE deficits/detail RLE Deficits / Details: Knee immobilizer in place - NO ROM at knee    Cervical / Trunk Assessment: Normal  Communication   Communication: No difficulties  Cognition Arousal/Alertness: Awake/alert Behavior During Therapy: WFL for tasks assessed/performed Overall Cognitive Status: Within Functional Limits for tasks assessed                      General Comments      Exercises General Exercises - Lower Extremity  Ankle Circles/Pumps: AROM;Both;15 reps;Supine      Assessment/Plan    PT Assessment Patient needs continued PT services  PT Diagnosis Difficulty walking   PT Problem List Decreased strength;Decreased range of motion;Decreased activity tolerance;Decreased mobility;Pain;Obesity;Decreased knowledge of use of DME  PT Treatment Interventions DME instruction;Gait training;Stair training;Functional mobility training;Therapeutic activities;Therapeutic exercise;Patient/family education   PT  Goals (Current goals can be found in the Care Plan section) Acute Rehab PT Goals Patient Stated Goal: Rehab at son's short term but home ASAP PT Goal Formulation: With patient Time For Goal Achievement: 08/19/14 Potential to Achieve Goals: Good    Frequency 7X/week   Barriers to discharge        Co-evaluation               End of Session Equipment Utilized During Treatment: Gait belt;Right knee immobilizer Activity Tolerance: Patient tolerated treatment well Patient left: in chair;with call bell/phone within reach Nurse Communication: Mobility status         Time: 5732-2025 PT Time Calculation (min): 26 min   Charges:   PT Evaluation $Initial PT Evaluation Tier I: 1 Procedure PT Treatments $Gait Training: 8-22 mins $Therapeutic Activity: 8-22 mins   PT G Codes:          May Ozment 08/12/2014, 12:53 PM

## 2014-08-13 NOTE — Progress Notes (Signed)
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 Patient could benefit from a longer KI to provide more support to prevent flexion of knee. Ortho tech to measure. Patient progressing well when Pain is controlled  Follow Up Recommendations  Home health PT;Supervision/Assistance - 24 hour     Equipment Recommendations  None recommended by PT    Recommendations for Other Services       Precautions / Restrictions Precautions Precautions: Knee;Fall;Other (comment) Required Braces or Orthoses: Knee Immobilizer - Right Knee Immobilizer - Right: On at all times Restrictions Other Position/Activity Restrictions: WBAT    Mobility  Bed Mobility Overal bed mobility: Needs Assistance Bed Mobility: Supine to Sit     Supine to sit: Min assist     General bed mobility comments: cues for sequence , support R leg  Transfers Overall transfer level: Needs assistance Equipment used: Rolling walker (2 wheeled) Transfers: Sit to/from Stand Sit to Stand: From elevated surface;Min assist         General transfer comment: cues for LE management and use of UEs to self assist  Ambulation/Gait Ambulation/Gait assistance: Min assist Ambulation Distance (Feet): 45 Feet Assistive device: Rolling walker (2 wheeled) Gait Pattern/deviations: Step-to pattern;Antalgic;Decreased step length - right Gait velocity: decr   General Gait Details: cues for posture, sequence, stride length and position from [employer]            Wheelchair Mobility    Modified Rankin (Stroke Patients Only)       Balance                                    Cognition Arousal/Alertness: Awake/alert                          Exercises      General  Comments        Pertinent Vitals/Pain Pain Score: 4  Pain Location: R knee Pain Descriptors / Indicators: Burning;Aching;Stabbing Pain Intervention(s): Monitored during session;Premedicated before session;Repositioned (repositioned KI)    Home Living                      Prior Function            PT Goals (current goals can now be found in the care plan section) Progress towards PT goals: Progressing toward goals    Frequency  7X/week    PT Plan Current plan remains appropriate    Co-evaluation             End of Session Equipment Utilized During Treatment: Gait belt;Right knee immobilizer Activity Tolerance: Patient tolerated treatment well Patient left: in chair;with call bell/phone within reach;with family/visitor present     Time: 1013-1040 PT Time Calculation (min): 27 min  Charges:  $Gait Training: 23-37 mins                    G Codes:      Claretha Cooper 08/13/2014, 2:02 PM Tresa Endo PT [phone]

## 2014-08-13 NOTE — Progress Notes (Signed)
Subjective: Patient doing well.  Pain controlled.  States that Dr Wynelle Link did not want her dressing removed until he sees her tomorrow.     Objective: Vital signs in last 24 hours: Temp:  [97.6 F (36.4 C)-98.1 F (36.7 C)] 98.1 F (36.7 C) (09/06 0620) Pulse Rate:  [78-87] 78 (09/06 0620) Resp:  [18-20] 18 (09/06 0620) BP: (138-162)/(71-89) 161/82 mmHg (09/06 0620) SpO2:  [96 %-99 %] 96 % (09/06 0620)  Intake/Output from previous day: 09/05 0701 - 09/06 0700 In: 1711.3 [P.O.:1080; I.V.:481.3; IV Piggyback:150] Out: 800 [Urine:800] Intake/Output this shift: Total I/O In: 360 [P.O.:360] Out: -    Recent Labs  08/11/14 1230  HGB 11.5*    Recent Labs  08/11/14 1230  WBC 9.9  RBC 4.63  HCT 36.6  PLT 526*    Recent Labs  08/11/14 1230  NA 137  K 3.8  CL 94*  CO2 27  BUN 16  CREATININE 1.21*  GLUCOSE 151*  CALCIUM 9.9   No results found for this basename: LABPT, INR,  in the last 72 hours  Exam:  Dressing c/d/i.  NVI.    Assessment/Plan: Continue present care.  Dr Wynelle Link will change dressing tomorrow.     Beth [name] 08/13/2014, 10:16 AM

## 2014-08-14 MED ORDER — OXYCODONE HCL 5 MG PO TABS: 5.0000 mg | ORAL_TABLET | ORAL | Status: DC | PRN

## 2014-08-14 MED ORDER — METHOCARBAMOL 500 MG PO TABS: 500.0000 mg | ORAL_TABLET | Freq: Four times a day (QID) | ORAL | Status: DC | PRN

## 2014-08-14 MED ORDER — ASPIRIN EC 325 MG PO TBEC: 325.0000 mg | DELAYED_RELEASE_TABLET | Freq: Every day | ORAL | Status: DC

## 2014-08-14 NOTE — Progress Notes (Signed)
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 Instructed in steps anyway just in case ramp not there.  Follow Up Recommendations  Home health PT;Supervision/Assistance - 24 hour     Equipment Recommendations  None recommended by PT    Recommendations for Other Services       Precautions / Restrictions Precautions Precautions: Knee;Fall;Other (comment) Precaution Comments: NO ROM R KNEE Required Braces or Orthoses: Knee Immobilizer - Right Knee Immobilizer - Right: On at all times    Mobility  Bed Mobility Overal bed mobility: Modified Independent                Transfers Overall transfer level: Needs assistance Equipment used: Rolling walker (2 wheeled) Transfers: Sit to/from Stand Sit to Stand: Supervision;From elevated surface         General transfer comment: cues for LE management and use of UEs to self assistm from [facility]  Ambulation/Gait Ambulation/Gait assistance: Min guard Ambulation Distance (Feet): 45 Feet Assistive device: Rolling walker (2 wheeled) Gait Pattern/deviations: Step-to pattern;Antalgic;Trunk flexed     General Gait Details: cues for posture, sequence, stride length and position from RW   Stairs Stairs: Yes Stairs assistance: Mod assist Stair Management: One rail Right;No rails;Forwards;With cane Number of Stairs: 2 General stair comments: practiced 1 step backward with RW also, provided  handout for both.  Information systems manager mobility: Yes  Modified Rankin (Stroke Patients Only)       Balance                                    Cognition Arousal/Alertness: Awake/alert                          Exercises Total Joint  Exercises Quad Sets: AROM;Both;Supine    General Comments        Pertinent Vitals/Pain Pain Score: 5  Pain Location: R knee Pain Descriptors / Indicators: Burning;Aching Pain Intervention(s): Ice applied;Patient requesting pain meds-RN notified    Home Living                      Prior Function            PT Goals (current goals can now be found in the care plan section) Progress towards PT goals: Progressing toward goals    Frequency  7X/week    PT Plan Current plan remains appropriate    Co-evaluation             End of Session Equipment Utilized During Treatment: Right knee immobilizer Activity Tolerance: Patient tolerated treatment well Patient left: in chair;with call bell/phone within reach;with family/visitor present     Time: 0927-1008 PT Time Calculation (min): 41 min  Charges:  $Gait Training: 23-37 mins $Self Care/Home Management: 8-22                    G Codes:      Claretha Cooper 08/14/2014, 10:22 AM

## 2014-08-14 NOTE — Discharge Instructions (Signed)
Patellar Tendon Tear / Disruption with Rehab A patellar tendon tear or disruption is a complete tear of the tendon below the kneecap. The patellar tendon attaches the thigh muscle (quadriceps) to the shinbone (tibia). These muscles are responsible for bending the knee and flexing the hip. A tear in the patellar tendon results in a disability to perform these actions. SYMPTOMS   A "pop" or tear felt in the knee or under the kneecap, at the time of injury.  Pain, tenderness, swelling, warmth, or redness over and around the patellar tendon.  Pain that gets worse when trying to forcefully straighten the knee or bend the knee.  Inability to straighten the knee when seated.  Crackling sound (crepitation) when the tendon is moved or touched.  Bruising (contusion) around the knee within 48 hours of injury.  Loss of firm fullness when pushing on the area where the tendon ruptured (a defect between the ends of the tendon where they separated from each other). CAUSES  The patellar tendon tears when a force is placed on it that is greater than it can handle. Common causes of injury include:  A stressful incident, such as with jumping, hurdling, or starting a sprint.  Direct hit (trauma) to the knee. RISK INCREASES WITH:  Sports that require sudden, explosive muscle contraction, such as those involving jumping or quick starts.  Running or contact sports.  Poor strength and flexibility.  Previous patellar tendon injury.  Untreated patellar tendinitis.  Corticosteroid injection into the patellar tendon. (Corticosteroid injections weaken tendons.) PREVENTION  Warm up and stretch properly before activity.  Allow for adequate recovery between workouts.  Maintain physical fitness:  Strength, flexibility, and endurance.  Cardiovascular fitness.  Protect the knee with taping, protective strapping, or elastic compression bandage during activity. PROGNOSIS  If treated properly, patellar  tendon tears usually heal, with a return to sports within 6 to 9 months after injury.  RELATED COMPLICATIONS   Weakness of the thigh (quadriceps) muscles, especially if the tear is left untreated.  Re-rupture of the tendon after treatment.  Prolonged disability.  Risks of surgery: infection, injury to nerves (numbness, weakness, or paralysis), bleeding, knee stiffness, knee weakness, pain when sitting for long periods, pain when getting up from a seated position and when kneeling or squatting, pain going up or down stairs or hills, and knee giving way or buckling. TREATMENT  Treatment first involves resting from any activities that aggravate the symptoms. The use of ice and medicine will help reduce pain and inflammation. Applying a compression bandage and elevating the knee above the level of the heart will also help reduce inflammation. Definitive treatment for patellar tendon tears is surgery, because contraction of the quadriceps tendon prevents healing of the tendon. Surgery often involves using stitches (sutures) to sew the ends of the tendon back together. Surgery is followed by restraint of the knee, to allow for healing. After restraint, it is important to perform strengthening and stretching exercises to help regain strength and a full range of motion. These exercises may be completed at home or with a therapist.  MEDICATION   If pain medicine is needed, nonsteroidal anti-inflammatory medicines (aspirin and ibuprofen), or other minor pain relievers (acetaminophen), are often advised.  Do not take pain medicine for 7 days before surgery.  Prescription pain relievers may be given, if your caregiver thinks they are needed. Use only as directed and only as much as you need. COLD THERAPY  Cold treatment (icing) should be applied for 10 to 15  minutes every 2 to 3 hours for inflammation and pain, and immediately after activity that aggravates your symptoms. Use ice packs or an ice  massage. SEEK MEDICAL CARE IF:  Pain increases, despite treatment.  Cast discomfort develops.  Any of the following occur after surgery: signs of infection, including fever, increased pain, swelling, redness, drainage of fluids, or bleeding in the affected area.  New, unexplained symptoms develop. (Drugs used in treatment may produce side effects.)  Pick up stool softner and laxative for home. Do not submerge incision under water. May shower. Continue to use ice for pain and swelling from surgery.  No motion or bending to knee. Knee Immobilizer at all times.

## 2014-08-14 NOTE — Discharge Summary (Signed)
Physician Discharge Summary   Patient ID: Beth [name] MRN: [id number] DOB/AGE: 1945/12/10 68 y.o.  Admit date: 08/11/2014 Discharge date: 08/14/2014  Primary Diagnosis:  Right patella tendon rupture.  Admission Diagnoses:  Past Medical History  Diagnosis Date  . Hypertension   . CVA (cerebral infarction)     2006  . Hyperlipidemia   . Blood transfusion without reported diagnosis 2012    anemia  . Heart murmur   . Stroke 2006    x 1  . Nocturia     3-4 times per night  . Complication of anesthesia 06-21-14    trouble waking up after colonscopy  . Family history of anesthesia complication     sister very slow to awaken after anesthesia  . Gout     left elbow  . Sleep apnea     no cpap  . Arthritis     Knee both knees  . Anemia   . 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 08-09-14 Right eye   Discharge Diagnoses:   Principal Problem:   Patellar tendon rupture  Estimated body mass index is 36.44 kg/(m^2) as calculated from the following:   Height as of this encounter: _0  (1.651 m).   Weight as of this encounter: 99.338 kg (219 lb).  Procedure:  Procedure(s) (LRB): RIGHT PATELLA TENDON REPAIR (Right)   Consults: None  HPI: Beth [name] is a 68 year old female, had a right  total knee arthroplasty done approximately 6-8 weeks ago. She had a  fall about a week to week and a half ago and was placed into a knee  immobilizer. She hyperflexed the knee. She came back in for followup  yesterday. She had developed some bloody drainage from the inferior  aspect of the incision. When I saw her yesterday, she is unable to  extend the knee and her exam was consistent with patella tendon rupture.  She presents now for patella tendon repair.  Laboratory Data: Admission on 08/11/2014  Component Date Value Ref Range Status  . WBC 08/11/2014 9.9  4.0 - 10.5 K/uL Final  . RBC 08/11/2014 4.63  3.87 - 5.11 MIL/uL Final  . Hemoglobin 08/11/2014 11.5* 12.0 - 15.0 g/dL Final  .  HCT 08/11/2014 36.6  36.0 - 46.0 % Final  . MCV 08/11/2014 79.0  78.0 - 100.0 fL Final  . MCH 08/11/2014 24.8* 26.0 - 34.0 pg Final  . MCHC 08/11/2014 31.4  30.0 - 36.0 g/dL Final  . RDW 08/11/2014 15.8* 11.5 - 15.5 % Final  . Platelets 08/11/2014 526* 150 - 400 K/uL Final  . Sodium 08/11/2014 137  137 - 147 mEq/L Final  . Potassium 08/11/2014 3.8  3.7 - 5.3 mEq/L Final  . Chloride 08/11/2014 94* 96 - 112 mEq/L Final  . CO2 08/11/2014 27  19 - 32 mEq/L Final  . Glucose, Bld 08/11/2014 151* 70 - 99 mg/dL Final  . BUN 08/11/2014 16  6 - 23 mg/dL Final  . Creatinine, Ser 08/11/2014 1.21* 0.50 - 1.10 mg/dL Final  . Calcium 08/11/2014 9.9  8.4 - 10.5 mg/dL Final  . GFR calc non Af Amer 08/11/2014 44* >90 mL/min Final  . GFR calc Af Amer 08/11/2014 51* >90 mL/min Final   Comment: (NOTE)                          The eGFR has been calculated using the CKD EPI equation.  This calculation has not been validated in all clinical situations.                          eGFR's persistently <90 mL/min signify possible Chronic Kidney                          Disease.  . Anion gap 08/11/2014 16* 5 - 15 Final  . Glucose-Capillary 08/12/2014 181* 70 - 99 mg/dL Final  . Comment 1 08/12/2014 Documented in Chart   Final  . Comment 2 08/12/2014 Notify RN   Final  Admission on 07/03/2014, Discharged on 07/05/2014  Component Date Value Ref Range Status  . ABO/RH(D) 07/03/2014 O POS   Final  . Antibody Screen 07/03/2014 NEG   Final  . Sample Expiration 07/03/2014 07/06/2014   Final  . ABO/RH(D) 07/03/2014 O POS   Final  . WBC 07/04/2014 11.2* 4.0 - 10.5 K/uL Final  . RBC 07/04/2014 4.28  3.87 - 5.11 MIL/uL Final  . Hemoglobin 07/04/2014 10.9* 12.0 - 15.0 g/dL Final  . HCT 07/04/2014 34.6* 36.0 - 46.0 % Final  . MCV 07/04/2014 80.8  78.0 - 100.0 fL Final  . MCH 07/04/2014 25.5* 26.0 - 34.0 pg Final  . MCHC 07/04/2014 31.5  30.0 - 36.0 g/dL Final  . RDW 07/04/2014 14.3  11.5 - 15.5 %  Final  . Platelets 07/04/2014 245  150 - 400 K/uL Final  . Sodium 07/04/2014 135* 137 - 147 mEq/L Final  . Potassium 07/04/2014 3.5* 3.7 - 5.3 mEq/L Final  . Chloride 07/04/2014 95* 96 - 112 mEq/L Final  . CO2 07/04/2014 25  19 - 32 mEq/L Final  . Glucose, Bld 07/04/2014 252* 70 - 99 mg/dL Final  . BUN 07/04/2014 23  6 - 23 mg/dL Final  . Creatinine, Ser 07/04/2014 0.81  0.50 - 1.10 mg/dL Final  . Calcium 07/04/2014 8.9  8.4 - 10.5 mg/dL Final  . GFR calc non Af Amer 07/04/2014 72* >90 mL/min Final  . GFR calc Af Amer 07/04/2014 83* >90 mL/min Final   Comment: (NOTE)                          The eGFR has been calculated using the CKD EPI equation.                          This calculation has not been validated in all clinical situations.                          eGFR's persistently <90 mL/min signify possible Chronic Kidney                          Disease.  . Anion gap 07/04/2014 15  5 - 15 Final  . WBC 07/05/2014 12.0* 4.0 - 10.5 K/uL Final  . RBC 07/05/2014 3.81* 3.87 - 5.11 MIL/uL Final  . Hemoglobin 07/05/2014 9.8* 12.0 - 15.0 g/dL Final  . HCT 07/05/2014 30.8* 36.0 - 46.0 % Final  . MCV 07/05/2014 80.8  78.0 - 100.0 fL Final  . MCH 07/05/2014 25.7* 26.0 - 34.0 pg Final  . MCHC 07/05/2014 31.8  30.0 - 36.0 g/dL Final  . RDW 07/05/2014 14.6  11.5 - 15.5 % Final  . Platelets 07/05/2014 237  150 -  400 K/uL Final  . Sodium 07/05/2014 135* 137 - 147 mEq/L Final  . Potassium 07/05/2014 3.4* 3.7 - 5.3 mEq/L Final  . Chloride 07/05/2014 94* 96 - 112 mEq/L Final  . CO2 07/05/2014 29  19 - 32 mEq/L Final  . Glucose, Bld 07/05/2014 238* 70 - 99 mg/dL Final  . BUN 07/05/2014 22  6 - 23 mg/dL Final  . Creatinine, Ser 07/05/2014 0.85  0.50 - 1.10 mg/dL Final  . Calcium 07/05/2014 8.9  8.4 - 10.5 mg/dL Final  . GFR calc non Af Amer 07/05/2014 68* >90 mL/min Final  . GFR calc Af Amer 07/05/2014 79* >90 mL/min Final   Comment: (NOTE)                          The eGFR has been calculated  using the CKD EPI equation.                          This calculation has not been validated in all clinical situations.                          eGFR's persistently <90 mL/min signify possible Chronic Kidney                          Disease.  Georgiann Hahn gap 07/05/2014 12  5 - 15 Final  Hospital Outpatient Visit on 06/27/2014  Component Date Value Ref Range Status  . Color, Urine 06/27/2014 YELLOW  YELLOW Final  . APPearance 06/27/2014 CLOUDY* CLEAR Final  . Specific Gravity, Urine 06/27/2014 1.015  1.005 - 1.030 Final  . pH 06/27/2014 6.0  5.0 - 8.0 Final  . Glucose, UA 06/27/2014 NEGATIVE  NEGATIVE mg/dL Final  . Hgb urine dipstick 06/27/2014 NEGATIVE  NEGATIVE Final  . Bilirubin Urine 06/27/2014 NEGATIVE  NEGATIVE Final  . Ketones, ur 06/27/2014 NEGATIVE  NEGATIVE mg/dL Final  . Protein, ur 06/27/2014 30* NEGATIVE mg/dL Final  . Urobilinogen, UA 06/27/2014 0.2  0.0 - 1.0 mg/dL Final  . Nitrite 06/27/2014 NEGATIVE  NEGATIVE Final  . Leukocytes, UA 06/27/2014 MODERATE* NEGATIVE Final  . MRSA, PCR 06/27/2014 NEGATIVE  NEGATIVE Final  . Staphylococcus aureus 06/27/2014 NEGATIVE  NEGATIVE Final   Comment:                                 The Xpert SA Assay (FDA                          approved for NASAL specimens                          in patients over 42 years of age),                          is one component of                          a comprehensive surveillance                          program.  Test performance has  been validated by [facility] for patients greater                          than or equal to 38 year old.                          It is not intended                          to diagnose infection nor to                          guide or monitor treatment.  Marland Kitchen aPTT 06/27/2014 32  24 - 37 seconds Final  . WBC 06/27/2014 8.2  4.0 - 10.5 K/uL Final  . RBC 06/27/2014 5.06  3.87 - 5.11 MIL/uL Final  . Hemoglobin  06/27/2014 13.1  12.0 - 15.0 g/dL Final  . HCT 06/27/2014 40.7  36.0 - 46.0 % Final  . MCV 06/27/2014 80.4  78.0 - 100.0 fL Final  . MCH 06/27/2014 25.9* 26.0 - 34.0 pg Final  . MCHC 06/27/2014 32.2  30.0 - 36.0 g/dL Final  . RDW 06/27/2014 14.5  11.5 - 15.5 % Final  . Platelets 06/27/2014 308  150 - 400 K/uL Final  . Sodium 06/27/2014 140  137 - 147 mEq/L Final  . Potassium 06/27/2014 3.6* 3.7 - 5.3 mEq/L Final  . Chloride 06/27/2014 97  96 - 112 mEq/L Final  . CO2 06/27/2014 30  19 - 32 mEq/L Final  . Glucose, Bld 06/27/2014 103* 70 - 99 mg/dL Final  . BUN 06/27/2014 22  6 - 23 mg/dL Final  . Creatinine, Ser 06/27/2014 0.95  0.50 - 1.10 mg/dL Final  . Calcium 06/27/2014 10.1  8.4 - 10.5 mg/dL Final  . Total Protein 06/27/2014 8.1  6.0 - 8.3 g/dL Final  . Albumin 06/27/2014 4.1  3.5 - 5.2 g/dL Final  . AST 06/27/2014 15  0 - 37 U/L Final  . ALT 06/27/2014 14  0 - 35 U/L Final  . Alkaline Phosphatase 06/27/2014 122* 39 - 117 U/L Final  . Total Bilirubin 06/27/2014 0.9  0.3 - 1.2 mg/dL Final  . GFR calc non Af Amer 06/27/2014 59* >90 mL/min Final  . GFR calc Af Amer 06/27/2014 69* >90 mL/min Final   Comment: (NOTE)                          The eGFR has been calculated using the CKD EPI equation.                          This calculation has not been validated in all clinical situations.                          eGFR's persistently <90 mL/min signify possible Chronic Kidney                          Disease.  . Anion gap 06/27/2014 13  5 - 15 Final  . Prothrombin Time 06/27/2014 13.9  11.6 - 15.2 seconds Final  .  INR 06/27/2014 1.07  0.00 - 1.49 Final  . Squamous Epithelial / LPF 06/27/2014 FEW* RARE Final  . WBC, UA 06/27/2014 7-10  <3 WBC/hpf Final  . Bacteria, UA 06/27/2014 RARE  RARE Final  . Casts 06/27/2014 HYALINE CASTS* NEGATIVE Final     X-Rays:No results found.  EKG: Orders placed in visit on 05/31/14  . EKG 12-LEAD     Hospital Course: Beth [name] is a 68 y.o. who  was admitted to [facility]. They were brought to the operating room on 08/11/2014 and underwent Procedure(s): [place].  Patient tolerated the procedure well and was later transferred to the recovery room and then to the orthopaedic floor for postoperative care.  They were given PO and IV analgesics for pain control following their surgery.  They were given 24 hours of postoperative antibiotics of  Anti-infectives   Start     Dose/Rate Route Frequency Ordered Stop   08/12/14 0600  ceFAZolin (ANCEF) IVPB 2 g/50 mL premix     2 g 100 mL/hr over 30 Minutes Intravenous On call to O.R. 08/11/14 1200 08/11/14 1722   08/11/14 2300  ceFAZolin (ANCEF) IVPB 2 g/50 mL premix     2 g 100 mL/hr over 30 Minutes Intravenous Every 6 hours 08/11/14 2106 08/13/14 1811     and started on DVT prophylaxis in the form of Aspirin.   PT and OT were ordered for total joint protocol.  Discharge planning consulted to help with postop disposition and equipment needs.  Patient had a decent night on the evening of surgery.  They started to get up OOB with therapy on day one.  Continued to work with therapy into day two.  Dressing was changed on day three and the incision was healing well.  By day three, the patient had progressed with therapy and meeting their goals.  Incision was healing well.  Patient was seen in rounds and was ready to go home.   Diet: Cardiac diet Activity:WBAT, NO MOTION OR BENDING TO KNEE Follow-up:in 2 weeks Disposition - Home Discharged Condition: good       Discharge Instructions   Call MD / Call 911    Complete by:  As directed   If you experience chest pain or shortness of breath, CALL 911 and be transported to the hospital emergency room.  If you develope a fever above 101 F, pus (white drainage) or increased drainage or redness at the wound, or calf pain, call your surgeon's office.     Change dressing    Complete by:  As directed   Change dressing daily with  sterile 4 x 4 inch gauze dressing and apply TED hose. Do not submerge the incision under water.     Constipation Prevention    Complete by:  As directed   Drink plenty of fluids.  Prune juice may be helpful.  You may use a stool softener, such as Colace (over the counter) 100 mg twice a day.  Use MiraLax (over the counter) for constipation as needed.     Diet - low sodium heart healthy    Complete by:  As directed      Discharge instructions    Complete by:  As directed   Pick up stool softner and laxative for home. Do not submerge incision under water. May shower. Continue to use ice for pain and swelling from surgery.  No motion or bending to knee. Knee Immobilizer at all times.  Do not put a pillow under the knee. Place it under the heel.    Complete by:  As directed      Do not sit on low chairs, stoools or toilet seats, as it may be difficult to get up from low surfaces    Complete by:  As directed      Driving restrictions    Complete by:  As directed   No driving until released by the physician.     Increase activity slowly as tolerated    Complete by:  As directed      Lifting restrictions    Complete by:  As directed   No lifting until released by the physician.     Patient may shower    Complete by:  As directed   You may shower without a dressing once there is no drainage.  Do not wash over the wound.  If drainage remains, do not shower until drainage stops.     TED hose    Complete by:  As directed   Use stockings (TED hose) for 3 weeks on both leg(s).  You may remove them at night for sleeping.     Weight bearing as tolerated    Complete by:  As directed             Medication List    STOP taking these medications       aspirin 81 MG chewable tablet  Replaced by:  aspirin EC 325 MG tablet      TAKE these medications       amLODipine 10 MG tablet  Commonly known as:  NORVASC  Take 10 mg by mouth every evening.     aspirin EC 325 MG tablet  Take 1  tablet (325 mg total) by mouth daily. Take aspirin 325 mg daily for three weeks total and then resume the home 81 mg baby aspirin daily     atorvastatin 20 MG tablet  Commonly known as:  LIPITOR  Take 20 mg by mouth every evening.     cephALEXin 500 MG capsule  Commonly known as:  KEFLEX  Take 500 mg by mouth 2 (two) times daily. For 7 days     furosemide 80 MG tablet  Commonly known as:  LASIX  Take 80 mg by mouth every other day.     methocarbamol 500 MG tablet  Commonly known as:  ROBAXIN  Take 1 tablet (500 mg total) by mouth every 6 (six) hours as needed for muscle spasms.     nebivolol 10 MG tablet  Commonly known as:  BYSTOLIC  Take 10 mg by mouth every morning.     olmesartan-hydrochlorothiazide 40-12.5 MG per tablet  Commonly known as:  BENICAR HCT  Take 1 tablet by mouth every morning.     oxyCODONE 5 MG immediate release tablet  Commonly known as:  Oxy IR/ROXICODONE  Take 1-2 tablets (5-10 mg total) by mouth every 3 (three) hours as needed for moderate pain or severe pain.     sodium chloride 0.65 % Soln nasal spray  Commonly known as:  OCEAN  Place 1 spray into both nostrils 2 (two) times daily as needed for congestion.       Follow-up Information   Follow up with Gearlean Alf, MD. Schedule an appointment as soon as possible for a visit in 2 weeks.   Specialty:  Orthopedic Surgery   Contact information:   [address] [place] [address] [phone]  Signed: [name], PA-C Orthopaedic Surgery 08/14/2014, 10:10 AM

## 2014-08-14 NOTE — Progress Notes (Signed)
   Subjective: 3 Days Post-Op Procedure(s) (LRB): RIGHT PATELLA TENDON REPAIR (Right) Patient reports pain as mild.   Plan is to go Home after hospital stay.  Objective: Vital signs in last 24 hours: Temp:  [98 F (36.7 C)-98.8 F (37.1 C)] 98.8 F (37.1 C) (09/07 0511) Pulse Rate:  [74-82] 74 (09/07 0511) Resp:  [20] 20 (09/07 0511) BP: (141-161)/(85-93) 141/93 mmHg (09/07 0511) SpO2:  [94 %-96 %] 96 % (09/07 0511)  Intake/Output from previous day:  Intake/Output Summary (Last 24 hours) at 08/14/14 0935 Last data filed at 08/13/14 2200  Gross per 24 hour  Intake    870 ml  Output      0 ml  Net    870 ml    Intake/Output this shift:    Labs:  Recent Labs  08/11/14 1230  HGB 11.5*    Recent Labs  08/11/14 1230  WBC 9.9  RBC 4.63  HCT 36.6  PLT 526*    Recent Labs  08/11/14 1230  NA 137  K 3.8  CL 94*  CO2 27  BUN 16  CREATININE 1.21*  GLUCOSE 151*  CALCIUM 9.9   No results found for this basename: LABPT, INR,  in the last 72 hours  EXAM General - Patient is Alert, Appropriate and Oriented Extremity - Neurologically intact Neurovascular intact Incision: dressing C/D/I No cellulitis present Compartment soft Dressing/Incision - clean, dry, no drainage Motor Function - intact, moving foot and toes well on exam.   Past Medical History  Diagnosis Date  . Hypertension   . CVA (cerebral infarction)     2006  . Hyperlipidemia   . Blood transfusion without reported diagnosis 2012    anemia  . Heart murmur   . Stroke 2006    x 1  . Nocturia     3-4 times per night  . Complication of anesthesia 06-21-14    trouble waking up after colonscopy  . Family history of anesthesia complication     sister very slow to awaken after anesthesia  . Gout     left elbow  . Sleep apnea     no cpap  . Arthritis     Knee both knees  . Anemia   . 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 08-09-14 Right eye    Assessment/Plan: 3 Days Post-Op  Procedure(s) (LRB): RIGHT PATELLA TENDON REPAIR (Right) Principal Problem:   Patellar tendon rupture   Discharge home with home health  DVT Prophylaxis - Aspirin Weight-Bearing as tolerated to right leg No range of motion right knee. Knee immobilizer at all times  Korynne Dols V 08/14/2014, 9:35 AM

## 2014-08-14 NOTE — Progress Notes (Signed)
Discharge summary sent to payer through [employer]  

## 2014-08-15 ENCOUNTER — Encounter (HOSPITAL_COMMUNITY): Payer: Self-pay | Admitting: Orthopedic Surgery

## 2014-08-15 NOTE — Care Management Note (Signed)
    Page 1 of 1   08/15/2014     9:35:47 AM CARE MANAGEMENT NOTE 08/15/2014  Patient:  TWANA, WILEMAN   Account Number:  [id number]  Date Initiated:  08/12/2014  Documentation initiated by:  [facility]  Subjective/Objective Assessment:   RIGHT PATELLA TENDON REPAIR     Action/Plan:   discharge planning   Anticipated DC Date:  08/14/2014   Anticipated DC Plan:  [place]  CM consult      [facility] Choice  HOME HEALTH   Choice offered to / List presented to:  C-1 Patient           Agency   Status of service:  Completed, signed off Medicare Important Message given?   (If response is "NO", the following Medicare IM given date fields will be blank) Date Medicare IM given:   Medicare IM given by:   Date Additional Medicare IM given:   Additional Medicare IM given by:    Discharge Disposition:  [place]  Per UR Regulation:    If discussed at Long Length of Stay Meetings, dates discussed:    Comments:  08/15/14 09:15 CM texted [facility] rep, Debbie to see if HHPT had been set up on this pt who discharged yesterday 08/14/14. [facility] confirms pt is set with their [facility] agency.  No other CM needs were communicated.  Mariane Masters, BSN, [place].

## 2014-09-20 ENCOUNTER — Other Ambulatory Visit: Payer: Self-pay | Admitting: *Deleted

## 2014-09-20 MED ORDER — AMLODIPINE BESYLATE 10 MG PO TABS: 10.0000 mg | ORAL_TABLET | Freq: Every evening | ORAL | Status: DC

## 2014-09-27 NOTE — Progress Notes (Signed)
Drew=Need orders please- she has pre op 10/ 22/15   Thanks

## 2014-09-28 ENCOUNTER — Other Ambulatory Visit (HOSPITAL_COMMUNITY): Payer: Self-pay | Admitting: *Deleted

## 2014-09-28 ENCOUNTER — Encounter (HOSPITAL_COMMUNITY): Payer: Self-pay | Admitting: Pharmacy Technician

## 2014-09-28 ENCOUNTER — Ambulatory Visit: Payer: Self-pay | Admitting: Orthopedic Surgery

## 2014-09-28 ENCOUNTER — Encounter (HOSPITAL_COMMUNITY)
Admission: RE | Admit: 2014-09-28 | Discharge: 2014-09-28 | Disposition: A | Discharge status: Home / Self Care | Payer: Medicare Other | Source: Ambulatory Visit | Attending: Orthopedic Surgery | Admitting: Orthopedic Surgery

## 2014-09-28 ENCOUNTER — Encounter (HOSPITAL_COMMUNITY): Payer: Self-pay

## 2014-09-28 DIAGNOSIS — Z01812 Encounter for preprocedural laboratory examination: Secondary | ICD-10-CM | POA: Diagnosis not present

## 2014-09-28 LAB — URINE MICROSCOPIC-ADD ON

## 2014-09-28 LAB — PROTIME-INR
INR: 1.12 (ref 0.00–1.49)
Prothrombin Time: 14.5 seconds (ref 11.6–15.2)

## 2014-09-28 LAB — COMPREHENSIVE METABOLIC PANEL
ALT: 10 U/L (ref 0–35)
AST: 12 U/L (ref 0–37)
Albumin: 3.1 g/dL — ABNORMAL LOW (ref 3.5–5.2)
Alkaline Phosphatase: 106 U/L (ref 39–117)
Anion gap: 11 (ref 5–15)
BUN: 15 mg/dL (ref 6–23)
CO2: 29 mEq/L (ref 19–32)
Calcium: 9.4 mg/dL (ref 8.4–10.5)
Chloride: 99 mEq/L (ref 96–112)
Creatinine, Ser: 0.88 mg/dL (ref 0.50–1.10)
GFR calc Af Amer: 75 mL/min — ABNORMAL LOW (ref >90)
GFR, EST NON AFRICAN AMERICAN: 65 mL/min — AB (ref >90)
Glucose, Bld: 258 mg/dL — ABNORMAL HIGH (ref 70–99)
Potassium: 4.6 mEq/L (ref 3.7–5.3)
SODIUM: 139 meq/L (ref 137–147)
Total Bilirubin: 0.7 mg/dL (ref 0.3–1.2)
Total Protein: 7.2 g/dL (ref 6.0–8.3)

## 2014-09-28 LAB — CBC
HCT: 36.4 % (ref 36.0–46.0)
Hemoglobin: 11.1 g/dL — ABNORMAL LOW (ref 12.0–15.0)
MCH: 23.2 pg — AB (ref 26.0–34.0)
MCHC: 30.5 g/dL (ref 30.0–36.0)
MCV: 76.2 fL — ABNORMAL LOW (ref 78.0–100.0)
PLATELETS: 387 10*3/uL (ref 150–400)
RBC: 4.78 MIL/uL (ref 3.87–5.11)
RDW: 17.3 % — ABNORMAL HIGH (ref 11.5–15.5)
WBC: 8.9 10*3/uL (ref 4.0–10.5)

## 2014-09-28 LAB — URINALYSIS, ROUTINE W REFLEX MICROSCOPIC
Bilirubin Urine: NEGATIVE
Glucose, UA: NEGATIVE mg/dL
Hgb urine dipstick: NEGATIVE
Ketones, ur: NEGATIVE mg/dL
Leukocytes, UA: NEGATIVE
NITRITE: NEGATIVE
Protein, ur: 100 mg/dL — AB
SPECIFIC GRAVITY, URINE: 1.026 (ref 1.005–1.030)
Urobilinogen, UA: 1 mg/dL (ref 0.0–1.0)
pH: 6.5 (ref 5.0–8.0)

## 2014-09-28 LAB — SURGICAL PCR SCREEN
MRSA, PCR: NEGATIVE
STAPHYLOCOCCUS AUREUS: NEGATIVE

## 2014-09-28 LAB — APTT: aPTT: 32 seconds (ref 24–37)

## 2014-09-28 NOTE — Progress Notes (Signed)
Preoperative surgical orders have been place into the Epic hospital system for Beth [name] on 09/28/2014, 11:53 AM  by Mickel Crow for surgery on 10/02/2014.  Preop Knee orders including Experal, IV Tylenol, and IV Decadron as long as there are no contraindications to the above medications. [name], PA-C

## 2014-09-28 NOTE — Patient Instructions (Addendum)
20     Your procedure is scheduled on:  Monday 10/02/2014             Enter through the [facility] at [place] and follow signs to Short Stay  at 1045 AM.  Call this number if you have problems the night before or morning of surgery:  [phone]   Remember:          Do not eat food  AFTER MIDNIGHT! MAY HAVE CLEAR LIQUIDS FROM MIDNIGHT UP NTIL 0845 AM THEN NOTHING UNTIL AFTER SURGERY!  Take these medicines the morning of surgery with A SIP OF WATER: [facility] IS NOT RESPONSIBLE FOR ANY BELONGINGS OR VALUABLES BROUGHT TO HOSPITAL.  Marland Kitchen  Leave suitcase in the car. After surgery it may be brought to your room.  For patients admitted to the hospital, checkout time is 11:00 AM the day of              Discharge.    DO NOT WEAR  JEWELRY,MAKE-UP,LOTIONS,POWDERS,PERFUMES,CONTACTS , DENTURES OR BRIDGEWORK ,AND DO NOT WEAR FALSE EYELASHES                                    Patients discharged the day of surgery will not be allowed to drive home. If going home the same day of surgery, must have someone stay with you  first 24 hrs.at home and arrange for someone to drive you home from the [place]: son-Glendon [name] or granddaughter-Jonda [name]   Special Instructions:              Please read over the following fact sheets that you were given:             1. [facility] - Preparing for Surgery Before surgery, you can play an important role.  Because skin is not sterile, your skin needs to be as free of germs as possible.  You can reduce the number of germs on your skin by washing with CHG (chlorahexidine gluconate) soap before surgery.  CHG is an antiseptic cleaner which kills germs and bonds with the skin to continue killing germs even after washing. Please DO NOT use if you have an allergy  to CHG or antibacterial soaps.  If your skin becomes reddened/irritated stop using the CHG and inform your nurse when you arrive at Short Stay. Do not shave (including legs and underarms) for at least 48 hours prior to the first CHG shower.  You may shave your face/neck. Please follow these instructions carefully:  1.  Shower with CHG Soap the night before surgery and the  morning of Surgery.  2.  If you choose to wash your hair, wash your hair first as usual with your  normal  shampoo.  3.  After you shampoo, rinse  your hair and body thoroughly to remove the  shampoo.                           4.  Use CHG as you would any other liquid soap.  You can apply chg directly  to the skin and wash                       Gently with a scrungie or clean washcloth.  5.  Apply the CHG Soap to your body ONLY FROM THE NECK DOWN.   Do not use on face/ open                           Wound or open sores. Avoid contact with eyes, ears mouth and genitals (private parts).                       Wash face,  Genitals (private parts) with your normal soap.             6.  Wash thoroughly, paying special attention to the area where your surgery  will be performed.  7.  Thoroughly rinse your body with warm water from the neck down.  8.  DO NOT shower/wash with your normal soap after using and rinsing off  the CHG Soap.                9.  Pat yourself dry with a clean towel.            10.  Wear clean pajamas.            11.  Place clean sheets on your bed the night of your first shower and do not  sleep with pets. Day of Surgery : Do not apply any lotions/deodorants the morning of surgery.  Please wear clean clothes to the hospital/surgery center.  FAILURE TO FOLLOW THESE INSTRUCTIONS MAY RESULT IN THE CANCELLATION OF YOUR SURGERY PATIENT SIGNATURE_________________________________  NURSE SIGNATURE__________________________________  ________________________________________________________________________   [name]  An incentive spirometer is a tool that can help keep your lungs clear and active. This tool measures how well you are filling your lungs with each breath. Taking long deep breaths may help reverse or decrease the chance of developing breathing (pulmonary) problems (especially infection) following:  A long period of time when you are unable to move or be active. BEFORE THE PROCEDURE   If the spirometer includes an indicator to show your best effort, your nurse or respiratory therapist will set it to a desired goal.  If possible, sit up straight or lean slightly forward. Try not to slouch.  Hold the incentive spirometer in an upright position. INSTRUCTIONS FOR USE  1. Sit on the edge of your bed if possible, or sit up as far as you can in bed or on a chair. 2. Hold the incentive spirometer in an upright position. 3. Breathe out normally. 4. Place the mouthpiece in your mouth and seal your lips tightly around it. 5. Breathe in slowly and as deeply as possible, raising the piston or the ball toward the top of the column. 6. Hold your breath for 3-5 seconds or for as long as possible. Allow the piston or ball to fall to the bottom of the column. 7. Remove the mouthpiece from your mouth and breathe out normally. 8. Rest for a few seconds and repeat  Steps 1 through 7 at least 10 times every 1-2 hours when you are awake. Take your time and take a few normal breaths between deep breaths. 9. The spirometer may include an indicator to show your best effort. Use the indicator as a goal to work toward during each repetition. 10. After each set of 10 deep breaths, practice coughing to be sure your lungs are clear. If you have an incision (the cut made at the time of surgery), support your incision when coughing by placing a pillow or rolled up towels firmly against it. Once you are able to get out of bed, walk around indoors and cough well. You may stop using the incentive spirometer when  instructed by your caregiver.  RISKS AND COMPLICATIONS  Take your time so you do not get dizzy or light-headed.  If you are in pain, you may need to take or ask for pain medication before doing incentive spirometry. It is harder to take a deep breath if you are having pain. AFTER USE  Rest and breathe slowly and easily.  It can be helpful to keep track of a log of your progress. Your caregiver can provide you with a simple table to help with this. If you are using the spirometer at home, follow these instructions: [place] IF:   You are having difficultly using the spirometer.  You have trouble using the spirometer as often as instructed.  Your pain medication is not giving enough relief while using the spirometer.  You develop fever of 100.5 F (38.1 C) or higher. SEEK IMMEDIATE MEDICAL CARE IF:   You cough up bloody sputum that had not been present before.  You develop fever of 102 F (38.9 C) or greater.  You develop worsening pain at or near the incision site. MAKE SURE YOU:   Understand these instructions.  Will watch your condition.  Will get help right away if you are not doing well or get worse. Document Released: 04/06/2007 Document Revised: 02/16/2012 Document Reviewed: 06/07/2007 [facility] Patient Information 2014 [place], [place].   ________________________________________________________________________

## 2014-09-29 NOTE — Progress Notes (Signed)
Patient came for pre-operative appointment 09/28/2014 and denies having history of Diabetes. Please look at last few lab results in EPIC as yesterday blood glucose was 258!Marland Kitchen Her surgery is Monday 10/02/2014.

## 2014-10-02 ENCOUNTER — Encounter (HOSPITAL_COMMUNITY)
Admission: RE | Disposition: A | Discharge status: Skilled Nursing Facility | Payer: Self-pay | Source: Ambulatory Visit | Attending: Orthopedic Surgery

## 2014-10-02 ENCOUNTER — Inpatient Hospital Stay (HOSPITAL_COMMUNITY)
Admission: RE | Admit: 2014-10-02 | Discharge: 2014-10-05 | DRG: 486 | Disposition: A | Discharge status: Skilled Nursing Facility | Payer: Medicare Other | Source: Ambulatory Visit | Attending: Orthopedic Surgery | Admitting: Orthopedic Surgery

## 2014-10-02 ENCOUNTER — Encounter (HOSPITAL_COMMUNITY): Payer: Medicare Other | Admitting: Anesthesiology

## 2014-10-02 ENCOUNTER — Inpatient Hospital Stay (HOSPITAL_COMMUNITY): Payer: Medicare Other | Admitting: Anesthesiology

## 2014-10-02 ENCOUNTER — Encounter (HOSPITAL_COMMUNITY): Payer: Self-pay | Admitting: *Deleted

## 2014-10-02 DIAGNOSIS — Z8673 Personal history of transient ischemic attack (TIA), and cerebral infarction without residual deficits: Secondary | ICD-10-CM | POA: Diagnosis not present

## 2014-10-02 DIAGNOSIS — Z79899 Other long term (current) drug therapy: Secondary | ICD-10-CM

## 2014-10-02 DIAGNOSIS — M109 Gout, unspecified: Secondary | ICD-10-CM | POA: Diagnosis present

## 2014-10-02 DIAGNOSIS — T8453XA Infection and inflammatory reaction due to internal right knee prosthesis, initial encounter: Principal | ICD-10-CM | POA: Diagnosis present

## 2014-10-02 DIAGNOSIS — Y838 Other surgical procedures as the cause of abnormal reaction of the patient, or of later complication, without mention of misadventure at the time of the procedure: Secondary | ICD-10-CM | POA: Diagnosis present

## 2014-10-02 DIAGNOSIS — E785 Hyperlipidemia, unspecified: Secondary | ICD-10-CM | POA: Diagnosis present

## 2014-10-02 DIAGNOSIS — M009 Pyogenic arthritis, unspecified: Secondary | ICD-10-CM | POA: Diagnosis present

## 2014-10-02 DIAGNOSIS — I10 Essential (primary) hypertension: Secondary | ICD-10-CM | POA: Diagnosis present

## 2014-10-02 DIAGNOSIS — G4730 Sleep apnea, unspecified: Secondary | ICD-10-CM | POA: Diagnosis present

## 2014-10-02 DIAGNOSIS — S86811D Strain of other muscle(s) and tendon(s) at lower leg level, right leg, subsequent encounter: Secondary | ICD-10-CM

## 2014-10-02 DIAGNOSIS — M25561 Pain in right knee: Secondary | ICD-10-CM | POA: Diagnosis present

## 2014-10-02 HISTORY — PX: I & D KNEE WITH POLY EXCHANGE: SHX5024

## 2014-10-02 LAB — GLUCOSE, CAPILLARY: Glucose-Capillary: 171 mg/dL — ABNORMAL HIGH (ref 70–99)

## 2014-10-02 LAB — CBC
HEMATOCRIT: 34.9 % — AB (ref 36.0–46.0)
Hemoglobin: 10.9 g/dL — ABNORMAL LOW (ref 12.0–15.0)
MCH: 23.4 pg — ABNORMAL LOW (ref 26.0–34.0)
MCHC: 31.2 g/dL (ref 30.0–36.0)
MCV: 75.1 fL — ABNORMAL LOW (ref 78.0–100.0)
Platelets: 346 10*3/uL (ref 150–400)
RBC: 4.65 MIL/uL (ref 3.87–5.11)
RDW: 17.1 % — ABNORMAL HIGH (ref 11.5–15.5)
WBC: 8.7 10*3/uL (ref 4.0–10.5)

## 2014-10-02 LAB — GRAM STAIN: Gram Stain: NONE SEEN

## 2014-10-02 LAB — TYPE AND SCREEN
ABO/RH(D): O POS
ANTIBODY SCREEN: NEGATIVE

## 2014-10-02 LAB — CREATININE, SERUM
Creatinine, Ser: 0.76 mg/dL (ref 0.50–1.10)
GFR, EST NON AFRICAN AMERICAN: 83 mL/min — AB (ref >90)

## 2014-10-02 SURGERY — IRRIGATION AND DEBRIDEMENT KNEE WITH POLY EXCHANGE
Anesthesia: General | Site: Knee | Laterality: Right

## 2014-10-02 MED ORDER — HYDROMORPHONE HCL 1 MG/ML IJ SOLN
0.2500 mg | INTRAMUSCULAR | Status: DC | PRN
  Administered 2014-10-02 (×4): 0.5 mg via INTRAVENOUS

## 2014-10-02 MED ORDER — METHOCARBAMOL 500 MG PO TABS
500.0000 mg | ORAL_TABLET | Freq: Four times a day (QID) | ORAL | Status: DC | PRN
  Administered 2014-10-02 – 2014-10-04 (×4): 500 mg via ORAL
  Filled 2014-10-02 (×4): qty 1

## 2014-10-02 MED ORDER — DEXAMETHASONE SODIUM PHOSPHATE 10 MG/ML IJ SOLN
10.0000 mg | Freq: Once | INTRAMUSCULAR | Status: DC
Start: 2014-10-02 — End: 2014-10-02

## 2014-10-02 MED ORDER — CEFAZOLIN SODIUM-DEXTROSE 2-3 GM-% IV SOLR
2.0000 g | Freq: Four times a day (QID) | INTRAVENOUS | Status: DC
  Administered 2014-10-02 – 2014-10-03 (×2): 2 g via INTRAVENOUS
  Filled 2014-10-02 (×3): qty 50

## 2014-10-02 MED ORDER — DEXAMETHASONE SODIUM PHOSPHATE 10 MG/ML IJ SOLN
INTRAMUSCULAR | Status: DC | PRN
  Administered 2014-10-02: 10 mg via INTRAVENOUS

## 2014-10-02 MED ORDER — BUPIVACAINE HCL (PF) 0.25 % IJ SOLN
INTRAMUSCULAR | Status: DC | PRN
  Administered 2014-10-02: 20 mL

## 2014-10-02 MED ORDER — ONDANSETRON HCL 4 MG PO TABS: 4.0000 mg | ORAL_TABLET | Freq: Four times a day (QID) | ORAL | Status: DC | PRN

## 2014-10-02 MED ORDER — ENOXAPARIN SODIUM 40 MG/0.4ML ~~LOC~~ SOLN
40.0000 mg | SUBCUTANEOUS | Status: DC
  Administered 2014-10-03: 40 mg via SUBCUTANEOUS
  Filled 2014-10-02 (×3): qty 0.4

## 2014-10-02 MED ORDER — HYDROCHLOROTHIAZIDE 25 MG PO TABS
25.0000 mg | ORAL_TABLET | Freq: Every day | ORAL | Status: DC
  Administered 2014-10-03 – 2014-10-05 (×3): 25 mg via ORAL
  Filled 2014-10-02 (×3): qty 1

## 2014-10-02 MED ORDER — ACETAMINOPHEN 10 MG/ML IV SOLN
INTRAVENOUS | Status: DC | PRN
  Administered 2014-10-02: 1000 mg via INTRAVENOUS

## 2014-10-02 MED ORDER — AMLODIPINE BESYLATE 10 MG PO TABS
10.0000 mg | ORAL_TABLET | Freq: Every evening | ORAL | Status: DC
  Administered 2014-10-02 – 2014-10-04 (×3): 10 mg via ORAL
  Filled 2014-10-02 (×5): qty 1

## 2014-10-02 MED ORDER — DOCUSATE SODIUM 100 MG PO CAPS
100.0000 mg | ORAL_CAPSULE | Freq: Two times a day (BID) | ORAL | Status: DC
  Administered 2014-10-02 – 2014-10-05 (×6): 100 mg via ORAL

## 2014-10-02 MED ORDER — PROPOFOL 10 MG/ML IV BOLUS
INTRAVENOUS | Status: AC
  Filled 2014-10-02: qty 20

## 2014-10-02 MED ORDER — SODIUM CHLORIDE 0.9 % IV SOLN
INTRAVENOUS | Status: DC
Start: 2014-10-02 — End: 2014-10-02

## 2014-10-02 MED ORDER — CEFAZOLIN SODIUM-DEXTROSE 2-3 GM-% IV SOLR
INTRAVENOUS | Status: AC
  Filled 2014-10-02: qty 50

## 2014-10-02 MED ORDER — HYDROMORPHONE HCL 2 MG/ML IJ SOLN
INTRAMUSCULAR | Status: AC
  Filled 2014-10-02: qty 1

## 2014-10-02 MED ORDER — PHENYLEPHRINE HCL 10 MG/ML IJ SOLN
INTRAMUSCULAR | Status: DC | PRN
  Administered 2014-10-02: 40 ug via INTRAVENOUS

## 2014-10-02 MED ORDER — PROMETHAZINE HCL 25 MG/ML IJ SOLN: 6.2500 mg | INTRAMUSCULAR | Status: DC | PRN

## 2014-10-02 MED ORDER — OXYCODONE HCL 5 MG PO TABS
5.0000 mg | ORAL_TABLET | ORAL | Status: DC | PRN
  Administered 2014-10-02 – 2014-10-05 (×12): 10 mg via ORAL
  Filled 2014-10-02 (×12): qty 2

## 2014-10-02 MED ORDER — OXYCODONE HCL 5 MG/5ML PO SOLN
5.0000 mg | Freq: Once | ORAL | Status: DC | PRN
  Filled 2014-10-02: qty 5

## 2014-10-02 MED ORDER — FUROSEMIDE 80 MG PO TABS
80.0000 mg | ORAL_TABLET | ORAL | Status: DC
  Administered 2014-10-03 – 2014-10-05 (×2): 80 mg via ORAL
  Filled 2014-10-02 (×2): qty 1

## 2014-10-02 MED ORDER — TRAMADOL HCL 50 MG PO TABS
50.0000 mg | ORAL_TABLET | Freq: Four times a day (QID) | ORAL | Status: DC | PRN
  Administered 2014-10-03: 100 mg via ORAL
  Filled 2014-10-02: qty 2

## 2014-10-02 MED ORDER — SODIUM CHLORIDE 0.9 % IJ SOLN
INTRAMUSCULAR | Status: DC | PRN
  Administered 2014-10-02: 30 mL

## 2014-10-02 MED ORDER — BUPIVACAINE LIPOSOME 1.3 % IJ SUSP
20.0000 mL | Freq: Once | INTRAMUSCULAR | Status: DC
  Filled 2014-10-02: qty 20

## 2014-10-02 MED ORDER — ONDANSETRON HCL 4 MG/2ML IJ SOLN
INTRAMUSCULAR | Status: AC
  Filled 2014-10-02: qty 2

## 2014-10-02 MED ORDER — OLMESARTAN MEDOXOMIL-HCTZ 40-25 MG PO TABS: 1.0000 | ORAL_TABLET | ORAL | Status: DC

## 2014-10-02 MED ORDER — FENTANYL CITRATE 0.05 MG/ML IJ SOLN
INTRAMUSCULAR | Status: AC
  Filled 2014-10-02: qty 2

## 2014-10-02 MED ORDER — ONDANSETRON HCL 4 MG/2ML IJ SOLN: 4.0000 mg | Freq: Four times a day (QID) | INTRAMUSCULAR | Status: DC | PRN

## 2014-10-02 MED ORDER — POLYETHYLENE GLYCOL 3350 17 G PO PACK: 17.0000 g | PACK | Freq: Every day | ORAL | Status: DC | PRN

## 2014-10-02 MED ORDER — HYDROMORPHONE HCL 1 MG/ML IJ SOLN
INTRAMUSCULAR | Status: DC | PRN
  Administered 2014-10-02 (×2): 0.5 mg via INTRAVENOUS

## 2014-10-02 MED ORDER — SODIUM CHLORIDE 0.9 % IV SOLN
INTRAVENOUS | Status: DC
  Administered 2014-10-02 – 2014-10-05 (×3): via INTRAVENOUS

## 2014-10-02 MED ORDER — LACTATED RINGERS IV SOLN
INTRAVENOUS | Status: DC
  Administered 2014-10-02: 1000 mL via INTRAVENOUS

## 2014-10-02 MED ORDER — PROPOFOL 10 MG/ML IV BOLUS
INTRAVENOUS | Status: DC | PRN
  Administered 2014-10-02: 200 mg via INTRAVENOUS
  Administered 2014-10-02 (×2): 50 mg via INTRAVENOUS

## 2014-10-02 MED ORDER — BISACODYL 10 MG RE SUPP: 10.0000 mg | Freq: Every day | RECTAL | Status: DC | PRN

## 2014-10-02 MED ORDER — HYDROMORPHONE HCL 1 MG/ML IJ SOLN
INTRAMUSCULAR | Status: AC
  Filled 2014-10-02: qty 1

## 2014-10-02 MED ORDER — CHLORHEXIDINE GLUCONATE 4 % EX LIQD: 60.0000 mL | Freq: Once | CUTANEOUS | Status: DC

## 2014-10-02 MED ORDER — NEBIVOLOL HCL 10 MG PO TABS
10.0000 mg | ORAL_TABLET | Freq: Every morning | ORAL | Status: DC
  Administered 2014-10-03 – 2014-10-05 (×3): 10 mg via ORAL
  Filled 2014-10-02 (×3): qty 1

## 2014-10-02 MED ORDER — CEFAZOLIN SODIUM-DEXTROSE 2-3 GM-% IV SOLR
2.0000 g | INTRAVENOUS | Status: AC
  Administered 2014-10-02: 2 g via INTRAVENOUS

## 2014-10-02 MED ORDER — ATORVASTATIN CALCIUM 20 MG PO TABS
20.0000 mg | ORAL_TABLET | Freq: Every evening | ORAL | Status: DC
  Administered 2014-10-02 – 2014-10-04 (×3): 20 mg via ORAL
  Filled 2014-10-02 (×4): qty 1

## 2014-10-02 MED ORDER — METOCLOPRAMIDE HCL 10 MG PO TABS: 5.0000 mg | ORAL_TABLET | Freq: Three times a day (TID) | ORAL | Status: DC | PRN

## 2014-10-02 MED ORDER — SALINE SPRAY 0.65 % NA SOLN
1.0000 | Freq: Two times a day (BID) | NASAL | Status: DC | PRN
  Filled 2014-10-02: qty 44

## 2014-10-02 MED ORDER — SUCCINYLCHOLINE CHLORIDE 20 MG/ML IJ SOLN
INTRAMUSCULAR | Status: DC | PRN
  Administered 2014-10-02: 100 mg via INTRAVENOUS

## 2014-10-02 MED ORDER — SODIUM CHLORIDE 0.9 % IR SOLN
Status: DC | PRN
  Administered 2014-10-02 (×2): 3000 mL

## 2014-10-02 MED ORDER — SODIUM CHLORIDE 0.9 % IJ SOLN
INTRAMUSCULAR | Status: AC
  Filled 2014-10-02: qty 50

## 2014-10-02 MED ORDER — BUPIVACAINE HCL (PF) 0.25 % IJ SOLN
INTRAMUSCULAR | Status: AC
  Filled 2014-10-02: qty 30

## 2014-10-02 MED ORDER — ONDANSETRON HCL 4 MG/2ML IJ SOLN
INTRAMUSCULAR | Status: DC | PRN
  Administered 2014-10-02: 4 mg via INTRAVENOUS

## 2014-10-02 MED ORDER — FLEET ENEMA 7-19 GM/118ML RE ENEM: 1.0000 | ENEMA | Freq: Once | RECTAL | Status: AC | PRN

## 2014-10-02 MED ORDER — BUPIVACAINE LIPOSOME 1.3 % IJ SUSP
INTRAMUSCULAR | Status: DC | PRN
  Administered 2014-10-02: 20 mL

## 2014-10-02 MED ORDER — FENTANYL CITRATE 0.05 MG/ML IJ SOLN
INTRAMUSCULAR | Status: DC | PRN
  Administered 2014-10-02 (×4): 50 ug via INTRAVENOUS

## 2014-10-02 MED ORDER — MEPERIDINE HCL 50 MG/ML IJ SOLN: 6.2500 mg | INTRAMUSCULAR | Status: DC | PRN

## 2014-10-02 MED ORDER — MORPHINE SULFATE 2 MG/ML IJ SOLN
INTRAMUSCULAR | Status: AC
  Filled 2014-10-02: qty 1

## 2014-10-02 MED ORDER — IRBESARTAN 300 MG PO TABS
300.0000 mg | ORAL_TABLET | Freq: Every day | ORAL | Status: DC
  Administered 2014-10-03 – 2014-10-04 (×2): 300 mg via ORAL
  Filled 2014-10-02 (×3): qty 1

## 2014-10-02 MED ORDER — OXYCODONE HCL 5 MG PO TABS: 5.0000 mg | ORAL_TABLET | Freq: Once | ORAL | Status: DC | PRN

## 2014-10-02 MED ORDER — METOCLOPRAMIDE HCL 5 MG/ML IJ SOLN: 5.0000 mg | Freq: Three times a day (TID) | INTRAMUSCULAR | Status: DC | PRN

## 2014-10-02 MED ORDER — DEXTROSE 5 % IV SOLN
500.0000 mg | Freq: Four times a day (QID) | INTRAVENOUS | Status: DC | PRN
  Administered 2014-10-02: 500 mg via INTRAVENOUS
  Filled 2014-10-02: qty 5

## 2014-10-02 MED ORDER — MORPHINE SULFATE 2 MG/ML IJ SOLN
1.0000 mg | INTRAMUSCULAR | Status: DC | PRN
  Administered 2014-10-02 (×2): 1 mg via INTRAVENOUS
  Filled 2014-10-02: qty 1

## 2014-10-02 MED ORDER — ACETAMINOPHEN 10 MG/ML IV SOLN
1000.0000 mg | Freq: Once | INTRAVENOUS | Status: DC
  Filled 2014-10-02: qty 100

## 2014-10-02 SURGICAL SUPPLY — 57 items
BAG SPEC THK2 15X12 ZIP CLS (MISCELLANEOUS) ×1
BAG ZIPLOCK 12X15 (MISCELLANEOUS) ×3 IMPLANT
BANDAGE ELASTIC 6 VELCRO ST LF (GAUZE/BANDAGES/DRESSINGS) ×3 IMPLANT
BANDAGE ESMARK 6X9 LF (GAUZE/BANDAGES/DRESSINGS) ×1 IMPLANT
BNDG CMPR 9X6 STRL LF SNTH (GAUZE/BANDAGES/DRESSINGS) ×1
BNDG ESMARK 6X9 LF (GAUZE/BANDAGES/DRESSINGS) ×3
CLOSURE WOUND 1/2 X4 (GAUZE/BANDAGES/DRESSINGS) ×1
CUFF TOURN SGL QUICK 34 (TOURNIQUET CUFF) ×3
CUFF TRNQT CYL 34X4X40X1 (TOURNIQUET CUFF) ×1 IMPLANT
DRAPE EXTREMITY TIBURON (DRAPES) ×3 IMPLANT
DRAPE POUCH INSTRU U-SHP 10X18 (DRAPES) ×3 IMPLANT
DRAPE U-SHAPE 47X51 STRL (DRAPES) ×3 IMPLANT
DRSG ADAPTIC 3X8 NADH LF (GAUZE/BANDAGES/DRESSINGS) ×3 IMPLANT
DRSG PAD ABDOMINAL 8X10 ST (GAUZE/BANDAGES/DRESSINGS) ×7 IMPLANT
DURAPREP 26ML APPLICATOR (WOUND CARE) ×3 IMPLANT
ELECT REM PT RETURN 9FT ADLT (ELECTROSURGICAL) ×3
ELECTRODE REM PT RTRN 9FT ADLT (ELECTROSURGICAL) ×1 IMPLANT
EVACUATOR 1/8 PVC DRAIN (DRAIN) ×3 IMPLANT
FACESHIELD WRAPAROUND (MASK) ×15 IMPLANT
FACESHIELD WRAPAROUND OR TEAM (MASK) ×5 IMPLANT
GAUZE SPONGE 4X4 12PLY STRL (GAUZE/BANDAGES/DRESSINGS) ×3 IMPLANT
GLOVE BIO SURGEON STRL SZ7.5 (GLOVE) ×3 IMPLANT
GLOVE BIO SURGEON STRL SZ8 (GLOVE) ×3 IMPLANT
GLOVE BIOGEL PI IND STRL 8 (GLOVE) ×1 IMPLANT
GLOVE BIOGEL PI INDICATOR 8 (GLOVE) ×2
GOWN STRL REUS W/TWL LRG LVL3 (GOWN DISPOSABLE) ×3 IMPLANT
GOWN STRL REUS W/TWL XL LVL3 (GOWN DISPOSABLE) ×3 IMPLANT
HANDPIECE INTERPULSE COAX TIP (DISPOSABLE) ×3
IMMOBILIZER KNEE 20 (SOFTGOODS) ×2 IMPLANT
IMMOBILIZER KNEE 20 THIGH 36 (SOFTGOODS) ×1 IMPLANT
INSERT PFC SIG STB SZ3 15.0MM (Knees) ×2 IMPLANT
KIT BASIN OR (CUSTOM PROCEDURE TRAY) ×3 IMPLANT
MANIFOLD NEPTUNE II (INSTRUMENTS) ×3 IMPLANT
NDL MA TROC 1/2 CIR (NEEDLE) IMPLANT
NEEDLE MA TROC 1/2 CIR (NEEDLE) ×3 IMPLANT
NS IRRIG 1000ML POUR BTL (IV SOLUTION) ×3 IMPLANT
PACK TOTAL JOINT (CUSTOM PROCEDURE TRAY) ×3 IMPLANT
PADDING CAST COTTON 6X4 STRL (CAST SUPPLIES) ×4 IMPLANT
POSITIONER SURGICAL ARM (MISCELLANEOUS) ×3 IMPLANT
SET HNDPC FAN SPRY TIP SCT (DISPOSABLE) ×1 IMPLANT
SPONGE LAP 18X18 X RAY DECT (DISPOSABLE) ×3 IMPLANT
STAPLER VISISTAT 35W (STAPLE) ×3 IMPLANT
STRIP CLOSURE SKIN 1/2X4 (GAUZE/BANDAGES/DRESSINGS) ×3 IMPLANT
SUCTION FRAZIER 12FR DISP (SUCTIONS) ×3 IMPLANT
SUT ETHIBOND 2 OS 4 DA (SUTURE) ×4 IMPLANT
SUT MNCRL AB 4-0 PS2 18 (SUTURE) ×1 IMPLANT
SUT PDS AB 1 CT1 27 (SUTURE) ×9 IMPLANT
SUT VIC AB 1 CT1 27 (SUTURE) ×6
SUT VIC AB 1 CT1 27XBRD ANTBC (SUTURE) IMPLANT
SUT VIC AB 2-0 CT1 27 (SUTURE) ×6
SUT VIC AB 2-0 CT1 TAPERPNT 27 (SUTURE) ×3 IMPLANT
SWAB COLLECTION DEVICE MRSA (MISCELLANEOUS) ×3 IMPLANT
TOWEL OR 17X26 10 PK STRL BLUE (TOWEL DISPOSABLE) ×6 IMPLANT
TRAY FOLEY CATH 14FRSI W/METER (CATHETERS) ×1 IMPLANT
TUBE ANAEROBIC SPECIMEN COL (MISCELLANEOUS) ×3 IMPLANT
WATER STERILE IRR 1500ML POUR (IV SOLUTION) ×3 IMPLANT
WRAP KNEE MAXI GEL POST OP (GAUZE/BANDAGES/DRESSINGS) ×6 IMPLANT

## 2014-10-02 NOTE — Anesthesia Preprocedure Evaluation (Signed)
Anesthesia Evaluation  Patient identified by MRN, date of birth, ID band Patient awake    Reviewed: Allergy & Precautions, H&P , NPO status , Patient's Chart, lab work & pertinent test results  History of Anesthesia Complications (+) Family history of anesthesia reaction and history of anesthetic complications  Airway Mallampati: II  TM Distance: >3 FB Neck ROM: Full    Dental no notable dental hx.    Pulmonary sleep apnea ,  breath sounds clear to auscultation  Pulmonary exam normal       Cardiovascular hypertension, Pt. on medications + Valvular Problems/Murmurs Rhythm:Regular Rate:Normal     Neuro/Psych CVA negative psych ROS   GI/Hepatic negative GI ROS, Neg liver ROS,   Endo/Other  negative endocrine ROS  Renal/GU negative Renal ROS     Musculoskeletal  (+) Arthritis -,   Abdominal   Peds  Hematology negative hematology ROS (+) anemia ,   Anesthesia Other Findings   Reproductive/Obstetrics negative OB ROS                             Anesthesia Physical Anesthesia Plan  ASA: III  Anesthesia Plan: General   Post-op Pain Management:    Induction: Intravenous  Airway Management Planned: LMA  Additional Equipment:   Intra-op Plan:   Post-operative Plan: Extubation in OR  Informed Consent: I have reviewed the patients History and Physical, chart, labs and discussed the procedure including the risks, benefits and alternatives for the proposed anesthesia with the patient or authorized representative who has indicated his/her understanding and acceptance.   Dental advisory given  Plan Discussed with: CRNA  Anesthesia Plan Comments:         Anesthesia Quick Evaluation

## 2014-10-02 NOTE — Brief Op Note (Signed)
10/02/2014  3:03 PM  PATIENT:  Beth [name]  68 y.o. female  PRE-OPERATIVE DIAGNOSIS:  RIGHT INFECTED KNEE   POST-OPERATIVE DIAGNOSIS:  RIGHT INFECTED KNEE   PROCEDURE:  Procedure(s): IRRIGATION AND DEBRIDEMENT RIGHT KNEE WITH POLY EXCHANGE (Right)  SURGEON:  Surgeon(s) and Role:    * Gearlean Alf, MD - Primary  PHYSICIAN ASSISTANT:   ASSISTANTS: [name], PA-C   ANESTHESIA:   general  EBL:     BLOOD ADMINISTERED:none  DRAINS: (Hemovac) Hemovact drain(s) in the right knee with  Suction Open   LOCAL MEDICATIONS USED:  OTHER Exparel  COUNTS:  YES  TOURNIQUET:   Total Tourniquet Time Documented: Thigh (Right) - 37 minutes Total: Thigh (Right) - 37 minutes   DICTATION: .Other Dictation: Dictation Number [id number]  PLAN OF CARE: Admit to inpatient   PATIENT DISPOSITION:  PACU - hemodynamically stable.

## 2014-10-02 NOTE — Interval H&P Note (Signed)
History and Physical Interval Note:  10/02/2014 1:40 PM  [name]  has presented today for surgery, with the diagnosis of RIGHT INFECTED KNEE   The various methods of treatment have been discussed with the patient and family. After consideration of risks, benefits and other options for treatment, the patient has consented to  Procedure(s): IRRIGATION AND DEBRIDEMENT RIGHT KNEE WITH POLY EXCHANGE (Right) as a surgical intervention .  The patient's history has been reviewed, patient examined, no change in status, stable for surgery.  I have reviewed the patient's chart and labs.  Questions were answered to the patient's satisfaction.     Beth [name]

## 2014-10-02 NOTE — H&P (Signed)
Beth [name] is an 68 y.o. female.   Chief Complaint: Right knee drainage HPI: Beth [name] is a 69 yo female s/p right TKA on 07/03/14 who presents with persistent drainage from her right knee incision after repair of a patella tendon rupture post-operatively. She has had antibiotics and her swelling has been mild but the drainage persists. She has not had fever, chills or infectious symptoms. She had labs showing normal WBC with slight elevation of sed rate and c-reactive protein. We discussed treatment options including 2 stage revision and irrigation and debridement with polyethylene exchange, and she opted for the latter. She presents today for that procedure.  Past Medical History  Diagnosis Date  . Hypertension   . CVA (cerebral infarction)     2006  . Hyperlipidemia   . Blood transfusion without reported diagnosis 2012    anemia  . Heart murmur   . Stroke 2006    x 1  . Nocturia     3-4 times per night  . Complication of anesthesia 06-21-14    trouble waking up after colonscopy  . Family history of anesthesia complication     sister very slow to awaken after anesthesia  . Gout     left elbow  . Sleep apnea     no cpap  . Arthritis     Knee both knees  . Anemia   . 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 08-09-14 Right eye    Past Surgical History  Procedure Laterality Date  . Carpal tunnel release Right 1983  . Abdominal hysterectomy  1983  . Colonscopy  June 21, 2014  . Nasal cauterization  2012  . Total knee arthroplasty Right 07/03/2014    Procedure: RIGHT TOTAL KNEE ARTHROPLASTY;  Surgeon: Gearlean Alf, MD;  Location: [facility];  Service: Orthopedics;  Laterality: Right;  . Patellar tendon repair Right 08/11/2014    Procedure: RIGHT PATELLA TENDON REPAIR;  Surgeon: Gearlean Alf, MD;  Location: [facility];  Service: Orthopedics;  Laterality: Right;  . Joint replacement  06/2014    right knee    Family History  Problem Relation Age of Onset  . Brain cancer Mother    . Ovarian cancer Mother   . Diabetes Son   . Diabetes Son   . Colon cancer Neg Hx   . Esophageal cancer Neg Hx   . Stomach cancer Neg Hx   . Rectal cancer Neg Hx    Social History:  reports that she has never smoked. She has never used smokeless tobacco. She reports that she does not drink alcohol or use illicit drugs.  Allergies:  Allergies  Allergen Reactions  . Clonidine Derivatives Other (See Comments)    Dizziness and weakness  . Shellfish Allergy Nausea And Vomiting    Medications Prior to Admission  Medication Sig Dispense Refill  . amLODipine (NORVASC) 10 MG tablet Take 1 tablet (10 mg total) by mouth every evening.  30 tablet  0  . aspirin EC 81 MG tablet Take 81 mg by mouth every morning.      Marland Kitchen atorvastatin (LIPITOR) 20 MG tablet Take 20 mg by mouth every evening.      . cephALEXin (KEFLEX) 500 MG capsule Take 500 mg by mouth 2 (two) times daily.       . COD LIVER OIL PO Take 1 tablet by mouth every morning.      . furosemide (LASIX) 80 MG tablet Take 80 mg by mouth every  other day.       . methocarbamol (ROBAXIN) 500 MG tablet Take 1 tablet (500 mg total) by mouth every 6 (six) hours as needed for muscle spasms.  80 tablet  0  . Multiple Vitamin (MULTIVITAMIN WITH MINERALS) TABS tablet Take 1 tablet by mouth every morning.      . nebivolol (BYSTOLIC) 10 MG tablet Take 10 mg by mouth every morning.      . olmesartan-hydrochlorothiazide (BENICAR HCT) 40-25 MG per tablet Take 1 tablet by mouth every morning.      Marland Kitchen oxyCODONE (OXY IR/ROXICODONE) 5 MG immediate release tablet Take 1-2 tablets (5-10 mg total) by mouth every 3 (three) hours as needed for moderate pain or severe pain.  80 tablet  0  . sodium chloride (OCEAN) 0.65 % SOLN nasal spray Place 1 spray into both nostrils 2 (two) times daily as needed for congestion.        Results for orders placed during the hospital encounter of 10/02/14 (from the past 48 hour(s))  GLUCOSE, CAPILLARY     Status: Abnormal    Collection Time    10/02/14 10:36 AM      Result Value Ref Range   Glucose-Capillary 171 (*) 70 - 99 mg/dL   Comment 1 Notify RN     No results found.  ROS  Blood pressure 151/83, pulse 93, temperature 97.7 F (36.5 C), temperature source Oral, resp. rate 18, height 5\' 5"  (1.651 m), weight 222 lb 4.8 oz (100.835 kg), SpO2 96.00%. Physical Exam Physical Examination: General appearance - alert, well appearing, and in no distress Mental status - alert, oriented to person, place, and time Chest - clear to auscultation, no wheezes, rales or rhonchi, symmetric air entry Heart - normal rate, regular rhythm, normal S1, S2, no murmurs, rubs, clicks or gallops Abdomen - soft, nontender, nondistended, no masses or organomegaly Neurological - alert, oriented, normal speech, no focal findings or movement disorder noted Right knee- minimal swelling; no warmth; ROM 10-110; small open area distal incision with slight serous drainage.   Assessment/Plan Right TKA with presumed infection- Plan irrigation and debridement with polyethylene exchange. Discussed in detail with patient who elects to proceed.  Gearlean Alf 10/02/2014, 12:46 PM

## 2014-10-02 NOTE — Transfer of Care (Signed)
Immediate Anesthesia Transfer of Care Note  Patient: Beth [name]  Procedure(s) Performed: Procedure(s): IRRIGATION AND DEBRIDEMENT RIGHT KNEE WITH POLY EXCHANGE (Right)  Patient Location: PACU  Anesthesia Type:General  Level of Consciousness: awake, alert  and oriented  Airway & Oxygen Therapy: Patient Spontanous Breathing and Patient connected to face mask oxygen  Post-op Assessment: Report given to PACU RN and Post -op Vital signs reviewed and stable  Post vital signs: Reviewed and stable  Complications: No apparent anesthesia complications

## 2014-10-03 ENCOUNTER — Encounter (HOSPITAL_COMMUNITY): Payer: Self-pay | Admitting: Orthopedic Surgery

## 2014-10-03 MED ORDER — SODIUM CHLORIDE 0.9 % IJ SOLN
10.0000 mL | INTRAMUSCULAR | Status: DC | PRN
  Administered 2014-10-05 (×2): 10 mL

## 2014-10-03 MED ORDER — CEFAZOLIN SODIUM-DEXTROSE 2-3 GM-% IV SOLR
2.0000 g | Freq: Four times a day (QID) | INTRAVENOUS | Status: DC
  Administered 2014-10-03 – 2014-10-05 (×8): 2 g via INTRAVENOUS
  Filled 2014-10-03 (×9): qty 50

## 2014-10-03 NOTE — Evaluation (Signed)
Physical Therapy Evaluation Patient Details Name: Beth [name] MRN: [id number] DOB: 08-15-1946 Today's Date: 10/03/2014   History of Present Illness  68 yo female s/p R TKA 07/03/14 then had fall with patellar tendon rupture approx one month ago and now s/p irrigation and debridement R knee with poly exchange  Clinical Impression  Patient is s/p I & D R knee with poly exchange surgery resulting in functional limitations due to the deficits listed below (see PT Problem List).  Patient will benefit from skilled PT to increase their independence and safety with mobility to allow discharge to the venue listed below.  Pt reports this is now her third surgery on this knee and she hopes it is the last.  Pt reports after previous fall at home in bathroom resulting in patellar tendon rupture, she plans to d/c to SNF this time.       Follow Up Recommendations SNF    Equipment Recommendations  None recommended by PT    Recommendations for Other Services       Precautions / Restrictions Precautions Precautions: Fall;Knee Precaution Comments: NO ROM or bending R knee Required Braces or Orthoses: Knee Immobilizer - Right Knee Immobilizer - Right: On at all times Restrictions Other Position/Activity Restrictions: WBAT with KI      Mobility  Bed Mobility Overal bed mobility: Needs Assistance Bed Mobility: Supine to Sit     Supine to sit: Min assist     General bed mobility comments: verbal cues for technique, assist for R LE  Transfers Overall transfer level: Needs assistance Equipment used: Rolling walker (2 wheeled) Transfers: Sit to/from Stand Sit to Stand: Min assist         General transfer comment: verbal cues for hand placement upon rise and assist for R LE forward upon descent  Ambulation/Gait Ambulation/Gait assistance: Min assist Ambulation Distance (Feet): 50 Feet Assistive device: Rolling walker (2 wheeled) Gait Pattern/deviations: Step-to  pattern;Antalgic;Decreased stance time - right     General Gait Details: verbal cues for sequence, RW distance, step length, posture  Stairs            Wheelchair Mobility    Modified Rankin (Stroke Patients Only)       Balance                                             Pertinent Vitals/Pain Pain Assessment: 0-10 Pain Score: 4  Pain Location: right knee Pain Descriptors / Indicators: Aching;Sore Pain Intervention(s): Limited activity within patient's tolerance;Monitored during session;Repositioned;Ice applied;Premedicated before session    Home Living Family/patient expects to be discharged to:: [place]: Gilford Rile - 2 wheels;Toilet riser;Bedside commode;Crutches      Prior Function Level of Independence: Independent with assistive device(s)               Hand Dominance        Extremity/Trunk Assessment               Lower Extremity Assessment: RLE deficits/detail RLE Deficits / Details: maintained KI       Communication   Communication: No difficulties  Cognition Arousal/Alertness: Awake/alert Behavior During Therapy: WFL for tasks assessed/performed Overall Cognitive Status: Within Functional Limits for tasks assessed  General Comments      Exercises        Assessment/Plan    PT Assessment Patient needs continued PT services  PT Diagnosis Difficulty walking;Acute pain   PT Problem List Decreased strength;Decreased mobility;Pain;Decreased knowledge of precautions  PT Treatment Interventions DME instruction;Gait training;Functional mobility training;Therapeutic activities;Therapeutic exercise;Patient/family education   PT Goals (Current goals can be found in the Care Plan section) Acute Rehab PT Goals PT Goal Formulation: With patient Time For Goal Achievement: 10/07/14 Potential to Achieve Goals: Good    Frequency 7X/week    Barriers to discharge        Co-evaluation               End of Session Equipment Utilized During Treatment: Gait belt;Right knee immobilizer Activity Tolerance: Patient tolerated treatment well Patient left: in chair;with call bell/phone within reach           Time: 1014-1038 PT Time Calculation (min): 24 min   Charges:   PT Evaluation $Initial PT Evaluation Tier I: 1 Procedure PT Treatments $Gait Training: 23-37 mins   PT G Codes:          Shawnetta Lein,KATHrine E 10/03/2014, 11:02 AM Carmelia Bake, PT, DPT 10/03/2014 Pager: [phone]

## 2014-10-03 NOTE — Progress Notes (Signed)
   Subjective: 1 Day Post-Op Procedure(s) (LRB): IRRIGATION AND DEBRIDEMENT RIGHT KNEE WITH POLY EXCHANGE (Right) Patient reports pain as mild.   Patient seen in rounds with Dr. Wynelle Link. Patient is well, but has had some minor complaints of pain in the knee, requiring pain medications We will start therapy today. Knee immobilizer AT ALL TIMES. No bending or motion to the knee.  Previous patella tendon rupture. Plan is to go Skilled nursing facility after hospital stay.  Objective: Vital signs in last 24 hours: Temp:  [97.3 F (36.3 C)-97.9 F (36.6 C)] 97.8 F (36.6 C) (10/27 0552) Pulse Rate:  [75-94] 85 (10/27 0552) Resp:  [11-25] 15 (10/27 0552) BP: (117-165)/(67-101) 151/98 mmHg (10/27 0552) SpO2:  [93 %-100 %] 100 % (10/27 0552) Weight:  [100.835 kg (222 lb 4.8 oz)] 100.835 kg (222 lb 4.8 oz) (10/26 1655)  Intake/Output from previous day:  Intake/Output Summary (Last 24 hours) at 10/03/14 0743 Last data filed at 10/03/14 0552  Gross per 24 hour  Intake   1945 ml  Output    193 ml  Net   1752 ml    Labs:  Recent Labs  10/02/14 1800  HGB 10.9*    Recent Labs  10/02/14 1800  WBC 8.7  RBC 4.65  HCT 34.9*  PLT 346    Recent Labs  10/02/14 1800  CREATININE 0.76   No results found for this basename: LABPT, INR,  in the last 72 hours  EXAM General - Patient is Alert and Appropriate Extremity - Neurovascular intact Sensation intact distally Dressing - dressing C/D/I Motor Function - intact, moving foot and toes well on exam.  Hemovac left in place today.  Will pull tomorrow.  Past Medical History  Diagnosis Date  . Hypertension   . CVA (cerebral infarction)     2006  . Hyperlipidemia   . Blood transfusion without reported diagnosis 2012    anemia  . Heart murmur   . Stroke 2006    x 1  . Nocturia     3-4 times per night  . Complication of anesthesia 06-21-14    trouble waking up after colonscopy  . Family history of anesthesia complication    sister very slow to awaken after anesthesia  . Gout     left elbow  . Sleep apnea     no cpap  . Arthritis     Knee both knees  . Anemia   . 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 08-09-14 Right eye    Assessment/Plan: 1 Day Post-Op Procedure(s) (LRB): IRRIGATION AND DEBRIDEMENT RIGHT KNEE WITH POLY EXCHANGE (Right) Active Problems:   Septic arthritis of knee  Estimated body mass index is 36.99 kg/(m^2) as calculated from the following:   Height as of this encounter: 5\' 5"  (1.651 m).   Weight as of this encounter: 100.835 kg (222 lb 4.8 oz). Advance diet Up with therapy Plan for discharge tomorrow Discharge to SNF  DVT Prophylaxis - Lovenox and then switch over to an Aspirin due to previous bleeding issues into the knee postop. Weight-Bearing as tolerated to right leg, no bending or motion to the right knee. D/C O2 and Pulse OX and try on Room Air  [name], PA-C Orthopaedic Surgery 10/03/2014, 7:43 AM

## 2014-10-03 NOTE — Progress Notes (Signed)
Clinical Social Work Department CLINICAL SOCIAL WORK PLACEMENT NOTE 10/03/2014  Patient:  Beth [name], Beth [name]  Account Number:  [id number] Admit date:  10/02/2014  Clinical Social Worker:  Werner Lean, LCSW  Date/time:  10/03/2014 01:46 PM  Clinical Social Work is seeking post-discharge placement for this patient at the following level of care:   ASSISTED LIVING/REST HOME   (*CSW will update this form in Epic as items are completed)   10/03/2014  Patient/family provided with [place] Department of Clinical Social Work's list of facilities offering this level of care within the geographic area requested by the patient (or if unable, by the patient's family).  10/03/2014  Patient/family informed of their freedom to choose among providers that offer the needed level of care, that participate in Medicare, Medicaid or managed care program needed by the patient, have an available bed and are willing to accept the patient.    Patient/family informed of [facility]' ownership interest in [facility], as well as of the fact that they are under no obligation to receive care at this facility.  PASARR submitted to EDS on 10/03/2014 PASARR number received on   FL2 transmitted to all facilities in geographic area requested by pt/family on  10/03/2014 FL2 transmitted to all facilities within larger geographic area on   Patient informed that his/her managed care company has contracts with or will negotiate with  certain facilities, including the following:     Patient/family informed of bed offers received:   Patient chooses bed at  Physician recommends and patient chooses bed at    Patient to be transferred to  on   Patient to be transferred to facility by  Patient and family notified of transfer on  Name of family member notified:    The following physician request were entered in Epic:   Additional Comments:  Werner Lean LCSW [phone]

## 2014-10-03 NOTE — Progress Notes (Signed)
Peripherally Inserted Central Catheter/Midline Placement  The IV Nurse has discussed with the patient and/or persons authorized to consent for the patient, the purpose of this procedure and the potential benefits and risks involved with this procedure.  The benefits include less needle sticks, lab draws from the catheter and patient may be discharged home with the catheter.  Risks include, but not limited to, infection, bleeding, blood clot (thrombus formation), and puncture of an artery; nerve damage and irregular heat beat.  Alternatives to this procedure were also discussed.  PICC/Midline Placement Documentation        Beth [name] 10/03/2014, 3:29 PM Consent obtained by Jacobo Forest, RN, CRNI

## 2014-10-03 NOTE — Progress Notes (Signed)
Clinical Social Work Department BRIEF PSYCHOSOCIAL ASSESSMENT 10/03/2014  Patient:  Beth [name], Beth [name]     Account Number:  [id number]     Admit date:  10/02/2014  Clinical Social Worker:  Lacie Scotts  Date/Time:  10/03/2014 01:39 PM  Referred by:  Physician  Date Referred:  10/03/2014 Referred for  SNF Placement   Other Referral:   Interview type:  Patient Other interview type:    PSYCHOSOCIAL DATA Living Status:  ALONE Admitted from facility:   Level of care:   Primary support name:  Kateri Mc Primary support relationship to patient:  CHILD, ADULT Degree of support available:   unclear    CURRENT CONCERNS Current Concerns  Post-Acute Placement   Other Concerns:    SOCIAL WORK ASSESSMENT / PLAN Pt is a 68 yr old female living at home prior to hospitalization. CSW met with pt to assist with d/c planning. This is a planned admission. PT has recommended ST SNF placement following hospital d/c. Pt is in agreement with this plan . SNF search has been initiated and bed offers are pending. CSW will continue to follow to assist with d/c planning to SNF.   Assessment/plan status:  Psychosocial Support/Ongoing Assessment of Needs Other assessment/ plan:   Information/referral to community resources:   Insurance coverage for SNF and ambulance transport reviewed.    PATIENT'S/FAMILY'S RESPONSE TO PLAN OF CARE: Pt feels [facility] Rehab will be helpful prior to returning home. She is motivated to work with PT.    Werner Lean LCSW [phone]

## 2014-10-03 NOTE — Op Note (Signed)
Beth [name], [name]                  ACCOUNT NO.:  [id number]  MEDICAL RECORD NO.:  [id number]  LOCATION:  1308                         FACILITY:  [facility]  PHYSICIAN:  [name], M.D.    DATE OF BIRTH:  06/05/1946  DATE OF PROCEDURE:  10/02/2014 DATE OF DISCHARGE:                              OPERATIVE REPORT   PREOPERATIVE DIAGNOSIS:  Right knee septic arthritis.  POSTOPERATIVE DIAGNOSIS:  Right knee septic arthritis.  PROCEDURE:  Right knee irrigation and debridement with polyethylene exchange.  SURGEON:  [name], MD  ASSISTANT:  [name], PA-C  ANESTHESIA:  General.  ESTIMATED BLOOD LOSS:  Minimal.  DRAINS:  Hemovac x1.  TOURNIQUET TIME:  37 minutes at 300 mmHg.  COMPLICATIONS:  None.  CONDITION:  Stable to recovery.  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  She had a fall a few weeks later and rupture of patella tendon.  She had a repair.  She had some drainage prior to the repair and then since repairs had persistent drainage.  We have treated her with oral antibiotics.  She never had fever, chills, or infectious symptoms, but with the persistent drainage, it is felt that she has a high likelihood of at least low-grade infection.  Her white blood cell count has been normal with her sed rate and C-reactive protein was slightly elevated, but not grossly elevated. Given the concerns about the persistent drainage, she presents now for irrigation and debridement of polyethylene exchange.  PROCEDURE IN DETAIL:  After successful administration of general anesthetic, the patient was placed supine on the operating table and tourniquet was placed high on her right thigh.  Right lower extremity prepped and draped in the usual sterile fashion.  Extremities wrapped in Esmarch, tourniquet inflated to 300 mmHg.  A midline incision was made with a 10 blade through the subcutaneous tissue  to the extensor mechanism.  Fresh blade was used to make a medial parapatellar arthrotomy.  The previous patella tendon repair appears partially healed, but appears to be somewhat attenuated.  We subluxed the patella laterally.  I thoroughly irrigated the joint with 3 L of saline solution using pulsatile lavage.  Prior to starting the irrigation, I sent cultures for anaerobic and anaerobic culture, Gram stain culture, and sensitivity.  The synovium was then debrided.  She really was not having any purulence and did not have any grossly inflamed synovium.  After completing the irrigation then I placed about 100 mL of Betadine into the joint and left that intact for about 5 minutes.  During that time, I shored of the repair of the patellar tendon.  Note that prior to placing the Betadine and prior to doing that, 3 L of irrigation I had to remove the tibial polyethylene which was 15 mm thickness.  After the Betadine, I then irrigated with another 2 L of saline with pulsatile lavage, placed a new 15 mm thickness rotating platform polyethylene from the tibial tray and reduced the knee.  We then irrigated with another L of saline using pulsatile lavage.  I closed the arthrotomy over Hemovac  drain with a running #1 V-Loc suture.  Subcu was closed with interrupted 2-0 Vicryl.  The tourniquet released total time 37 minutes.  Skin was closed with staples.  The incision was cleaned and dried and a bulky sterile dressing applied.  She was placed into a knee immobilizer awakened and transported to recovery in stable condition.     [name], M.D.     FA/MEDQ  D:  10/02/2014  T:  10/03/2014  Job:  [id number]

## 2014-10-03 NOTE — Anesthesia Postprocedure Evaluation (Signed)
Anesthesia Post Note  Patient: Beth [name]  Procedure(s) Performed: Procedure(s) (LRB): IRRIGATION AND DEBRIDEMENT RIGHT KNEE WITH POLY EXCHANGE (Right)  Anesthesia type: General  Patient location: PACU  Post pain: Pain level controlled  Post assessment: Post-op Vital signs reviewed  Last Vitals: BP 147/76  Pulse 89  Temp(Src) 36.7 C (Oral)  Resp 14  Ht 5\' 5"  (1.651 m)  Wt 222 lb 4.8 oz (100.835 kg)  BMI 36.99 kg/m2  SpO2 99%  Post vital signs: Reviewed  Level of consciousness: sedated  Complications: No apparent anesthesia complications

## 2014-10-04 MED ORDER — ASPIRIN 325 MG PO TABS
325.0000 mg | ORAL_TABLET | Freq: Every day | ORAL | Status: DC
  Administered 2014-10-04 – 2014-10-05 (×2): 325 mg via ORAL
  Filled 2014-10-04 (×2): qty 1

## 2014-10-04 NOTE — Care Management Note (Signed)
    Page 1 of 1   10/04/2014     9:29:49 AM CARE MANAGEMENT NOTE 10/04/2014  Patient:  Beth [name], Beth [name]   Account Number:  [id number]  Date Initiated:  10/04/2014  Documentation initiated by:  [facility]  Subjective/Objective Assessment:   adm: IRRIGATION AND DEBRIDEMENT RIGHT KNEE WITH POLY EXCHANGE (Right)     Action/Plan:   discharge planning   Anticipated DC Date:  10/04/2014   Anticipated DC Plan:  ASSISTED LIVING / REST HOME         Choice offered to / List presented to:             Status of service:  Completed, signed off Medicare Important Message given?   (If response is "NO", the following Medicare IM given date fields will be blank) Date Medicare IM given:   Medicare IM given by:   Date Additional Medicare IM given:   Additional Medicare IM given by:    Discharge Disposition:    Per UR Regulation:    If discussed at Long Length of Stay Meetings, dates discussed:    Comments:  10/04/14 09:20 CM notes pt to go to [place]; CSW arranging.  No other CM needs were communicated.  Mariane Masters, BSN, CM [phone].

## 2014-10-04 NOTE — Progress Notes (Addendum)
   Subjective: 2 Days Post-Op Procedure(s) (LRB): IRRIGATION AND DEBRIDEMENT RIGHT KNEE WITH POLY EXCHANGE (Right) Patient reports pain as mild.   Patient seen in rounds with Dr. Wynelle Link. Patient is having problems with pain in the knee, requiring pain medications Plan is to go Skilled nursing facility after hospital stay.  Objective: Vital signs in last 24 hours: Temp:  [98.3 F (36.8 C)-98.9 F (37.2 C)] 98.5 F (36.9 C) (10/28 0448) Pulse Rate:  [80-88] 80 (10/28 0448) Resp:  [14-15] 15 (10/28 0448) BP: (149-161)/(70-93) 161/83 mmHg (10/28 0448) SpO2:  [97 %-98 %] 98 % (10/28 0448)  Intake/Output from previous day:  Intake/Output Summary (Last 24 hours) at 10/04/14 1142 Last data filed at 10/04/14 0150  Gross per 24 hour  Intake    720 ml  Output    990 ml  Net   -270 ml     Labs:  Recent Labs  10/02/14 1800  HGB 10.9*    Recent Labs  10/02/14 1800  WBC 8.7  RBC 4.65  HCT 34.9*  PLT 346    Recent Labs  10/02/14 1800  CREATININE 0.76   No results found for this basename: LABPT, INR,  in the last 72 hours  EXAM General - Patient is Alert, Appropriate and Oriented Extremity - Neurovascular intact Sensation intact distally Dressing/Incision - clean, dry, no drainage Motor Function - intact, moving foot and toes well on exam.   Past Medical History  Diagnosis Date  . Hypertension   . CVA (cerebral infarction)     2006  . Hyperlipidemia   . Blood transfusion without reported diagnosis 2012    anemia  . Heart murmur   . Stroke 2006    x 1  . Nocturia     3-4 times per night  . Complication of anesthesia 06-21-14    trouble waking up after colonscopy  . Family history of anesthesia complication     sister very slow to awaken after anesthesia  . Gout     left elbow  . Sleep apnea     no cpap  . Arthritis     Knee both knees  . Anemia   . 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 08-09-14 Right eye    Assessment/Plan: 2 Days Post-Op  Procedure(s) (LRB): IRRIGATION AND DEBRIDEMENT RIGHT KNEE WITH POLY EXCHANGE (Right) Active Problems:   Septic arthritis of knee  Estimated body mass index is 36.99 kg/(m^2) as calculated from the following:   Height as of this encounter: 5\' 5"  (1.651 m).   Weight as of this encounter: 100.835 kg (222 lb 4.8 oz). Up with therapy Plan for discharge tomorrow Discharge to SNF  DVT Prophylaxis - Aspirin 325 mg daily Weight-Bearing as tolerated to right leg Knee Immobilizer at all times No bending or motion to the knee  In order to meet SCIP qualifications, please note that the patient has been continued on IV ABX for infection associated with the right knee.  [name], PA-C Orthopaedic Surgery 10/04/2014, 11:42 AM

## 2014-10-04 NOTE — Progress Notes (Signed)
Utilization review completed.  

## 2014-10-04 NOTE — Progress Notes (Signed)
Physical Therapy Treatment Patient Details Name: Beth [name] MRN: [id number] DOB: [birth year] Today's Date: 10/17/2014    History of Present Illness 67 yo female s/p R TKA 07/03/14 then had fall with patellar tendon rupture approx one month ago and now s/p irrigation and debridement R knee with poly exchange    PT Comments    Pt ambulated good distance in hallway with verbal cues for sequence and technique.  Pt plans to d/c to SNF.  Follow Up Recommendations  SNF     Equipment Recommendations  None recommended by PT    Recommendations for Other Services       Precautions / Restrictions Precautions Precautions: Fall;Knee Precaution Comments: NO ROM or bending R knee Required Braces or Orthoses: Knee Immobilizer - Right Knee Immobilizer - Right: On at all times Restrictions Other Position/Activity Restrictions: WBAT with KI    Mobility  Bed Mobility               General bed mobility comments: pt up in recliner on arrival  Transfers Overall transfer level: Needs assistance Equipment used: Rolling walker (2 wheeled) Transfers: Sit to/from Stand Sit to Stand: Min assist         General transfer comment: verbal cues for hand placement, assist to steady with rise  Ambulation/Gait Ambulation/Gait assistance: Min guard Ambulation Distance (Feet): 80 Feet Assistive device: Rolling walker (2 wheeled) Gait Pattern/deviations: Step-to pattern;Antalgic;Trunk flexed     General Gait Details: verbal cues for sequence, RW distance, step length, posture   Stairs            Wheelchair Mobility    Modified Rankin (Stroke Patients Only)       Balance                                    Cognition Arousal/Alertness: Awake/alert Behavior During Therapy: WFL for tasks assessed/performed Overall Cognitive Status: Within Functional Limits for tasks assessed                      Exercises      General Comments        Pertinent  Vitals/Pain Pain Assessment: 0-10 Pain Score: 3  Pain Location: R knee Pain Descriptors / Indicators: Tightness Pain Intervention(s): Limited activity within patient's tolerance;Monitored during session;Premedicated before session;Repositioned    Home Living                      Prior Function            PT Goals (current goals can now be found in the care plan section) Progress towards PT goals: Progressing toward goals    Frequency  7X/week    PT Plan Current plan remains appropriate    Co-evaluation             End of Session Equipment Utilized During Treatment: Right knee immobilizer Activity Tolerance: Patient tolerated treatment well Patient left: in chair;with call bell/phone within reach     Time: 4098-1191 PT Time Calculation (min): 17 min  Charges:  $Gait Training: 8-22 mins                    G Codes:      Teosha Casso,KATHrine E October 17, 2014, 12:09 PM Carmelia Bake, PT, DPT 10-17-14 Pager: [phone]

## 2014-10-05 LAB — BASIC METABOLIC PANEL
Anion gap: 10 (ref 5–15)
BUN: 16 mg/dL (ref 6–23)
CO2: 31 mEq/L (ref 19–32)
Calcium: 8.4 mg/dL (ref 8.4–10.5)
Chloride: 94 mEq/L — ABNORMAL LOW (ref 96–112)
Creatinine, Ser: 0.73 mg/dL (ref 0.50–1.10)
GFR calc Af Amer: >90 mL/min (ref >90)
GFR calc non Af Amer: 85 mL/min — ABNORMAL LOW (ref >90)
Glucose, Bld: 178 mg/dL — ABNORMAL HIGH (ref 70–99)
Potassium: 3.2 mEq/L — ABNORMAL LOW (ref 3.7–5.3)
Sodium: 135 mEq/L — ABNORMAL LOW (ref 137–147)

## 2014-10-05 LAB — CBC
HCT: 29.1 % — ABNORMAL LOW (ref 36.0–46.0)
HEMOGLOBIN: 8.8 g/dL — AB (ref 12.0–15.0)
MCH: 23.2 pg — ABNORMAL LOW (ref 26.0–34.0)
MCHC: 30.2 g/dL (ref 30.0–36.0)
MCV: 76.6 fL — ABNORMAL LOW (ref 78.0–100.0)
Platelets: 328 10*3/uL (ref 150–400)
RBC: 3.8 MIL/uL — ABNORMAL LOW (ref 3.87–5.11)
RDW: 17.7 % — ABNORMAL HIGH (ref 11.5–15.5)
WBC: 11.7 10*3/uL — ABNORMAL HIGH (ref 4.0–10.5)

## 2014-10-05 MED ORDER — DEXTROSE 5 % IV SOLN
1.0000 g | Freq: Two times a day (BID) | INTRAVENOUS | Status: DC
  Administered 2014-10-05: 1 g via INTRAVENOUS
  Filled 2014-10-05: qty 10

## 2014-10-05 MED ORDER — ASPIRIN 325 MG PO TABS: 325.0000 mg | ORAL_TABLET | Freq: Every day | ORAL | Status: DC

## 2014-10-05 MED ORDER — METHOCARBAMOL 500 MG PO TABS: 500.0000 mg | ORAL_TABLET | Freq: Four times a day (QID) | ORAL | Status: DC | PRN

## 2014-10-05 MED ORDER — METOCLOPRAMIDE HCL 5 MG PO TABS: 5.0000 mg | ORAL_TABLET | Freq: Three times a day (TID) | ORAL | Status: DC | PRN

## 2014-10-05 MED ORDER — DSS 100 MG PO CAPS: 100.0000 mg | ORAL_CAPSULE | Freq: Two times a day (BID) | ORAL | Status: DC

## 2014-10-05 MED ORDER — BISACODYL 10 MG RE SUPP: 10.0000 mg | Freq: Every day | RECTAL | Status: DC | PRN

## 2014-10-05 MED ORDER — TRAMADOL HCL 50 MG PO TABS: 50.0000 mg | ORAL_TABLET | Freq: Four times a day (QID) | ORAL | Status: DC | PRN

## 2014-10-05 MED ORDER — HEPARIN SOD (PORK) LOCK FLUSH 100 UNIT/ML IV SOLN
250.0000 [IU] | INTRAVENOUS | Status: AC | PRN
  Administered 2014-10-05: 13:00:00

## 2014-10-05 MED ORDER — OXYCODONE HCL 5 MG PO TABS: 5.0000 mg | ORAL_TABLET | ORAL | Status: DC | PRN

## 2014-10-05 MED ORDER — DEXTROSE 5 % IV SOLN: 1.0000 g | Freq: Two times a day (BID) | INTRAVENOUS | Status: DC

## 2014-10-05 MED ORDER — POLYETHYLENE GLYCOL 3350 17 G PO PACK: 17.0000 g | PACK | Freq: Every day | ORAL | Status: DC | PRN

## 2014-10-05 MED ORDER — ONDANSETRON HCL 4 MG PO TABS: 4.0000 mg | ORAL_TABLET | Freq: Four times a day (QID) | ORAL | Status: DC | PRN

## 2014-10-05 NOTE — Progress Notes (Addendum)
Clinical Social Work Department CLINICAL SOCIAL WORK PLACEMENT NOTE 10/05/2014  Patient:  Beth [name], Beth [name]  Account Number:  [id number] Admit date:  10/02/2014  Clinical Social Worker:  Werner Lean, LCSW  Date/time:  10/03/2014 01:46 PM  Clinical Social Work is seeking post-discharge placement for this patient at the following level of care:  SNF PLACEMENT  (*CSW will update this form in Epic as items are completed)   10/03/2014  Patient/family provided with [place] Department of Clinical Social Work's list of facilities offering this level of care within the geographic area requested by the patient (or if unable, by the patient's family).  10/03/2014  Patient/family informed of their freedom to choose among providers that offer the needed level of care, that participate in Medicare, Medicaid or managed care program needed by the patient, have an available bed and are willing to accept the patient.    Patient/family informed of [facility]' ownership interest in [facility], as well as of the fact that they are under no obligation to receive care at this facility.  PASARR submitted to EDS on 10/03/2014 PASARR number received on   FL2 transmitted to all facilities in geographic area requested by pt/family on  10/03/2014 FL2 transmitted to all facilities within larger geographic area on   Patient informed that his/her managed care company has contracts with or will negotiate with  certain facilities, including the following:     Patient/family informed of bed offers received:  10/04/2014 Patient chooses bed at [place] Physician recommends and patient chooses bed at    Patient to be transferred to [place] on  10/05/2014 Patient to be transferred to facility by FAMILY Patient and family notified of transfer on 10/05/2014 Name of family member notified:  DAUGHTER  The following physician request were entered in Epic:   Additional Comments: Pt / family are  in agreement with d/c to SNF today. Pt is able to transport by car. NSG reviewed d/c summary, scripts, avs. Scripts are included in d/c packet.  Werner Lean LCSW [phone]

## 2014-10-05 NOTE — Progress Notes (Signed)
Physical Therapy Treatment Patient Details Name: Beth [name] MRN: [id number] DOB: [birth year] Today's Date: 10/05/2014    History of Present Illness 68 yo female s/p R TKA 07/03/14 then had fall with patellar tendon rupture approx one month ago and now s/p irrigation and debridement R knee with poly exchange    PT Comments    Pt ambulated in hallway and able to tolerate improvement in distance today although required a couple short standing rest breaks due to UE fatigue.  Reviewed proper position of KI and to wear at all times for no bending/flexion at knee.  Also reviewed safety with ice packs.  Pt to d/c to SNF today and had no further questions.   Follow Up Recommendations  SNF     Equipment Recommendations  None recommended by PT    Recommendations for Other Services       Precautions / Restrictions Precautions Precautions: Fall;Knee Precaution Comments: NO ROM or bending R knee Required Braces or Orthoses: Knee Immobilizer - Right Knee Immobilizer - Right: On at all times Restrictions Other Position/Activity Restrictions: WBAT with KI    Mobility  Bed Mobility               General bed mobility comments: pt up in recliner on arrival  Transfers Overall transfer level: Needs assistance Equipment used: Rolling walker (2 wheeled) Transfers: Sit to/from Stand Sit to Stand: Min guard         General transfer comment: verbal cues for R LE  Ambulation/Gait Ambulation/Gait assistance: Supervision Ambulation Distance (Feet): 120 Feet Assistive device: Rolling walker (2 wheeled) Gait Pattern/deviations: Step-to pattern;Trunk flexed;Antalgic     General Gait Details: verbal cues for foot placement, step length, posture   Stairs            Wheelchair Mobility    Modified Rankin (Stroke Patients Only)       Balance                                    Cognition Arousal/Alertness: Awake/alert Behavior During Therapy: WFL for tasks  assessed/performed Overall Cognitive Status: Within Functional Limits for tasks assessed                      Exercises      General Comments        Pertinent Vitals/Pain Pain Assessment: 0-10 Pain Score: 3  Pain Location: R knee Pain Descriptors / Indicators: Aching;Sore Pain Intervention(s): Repositioned;Ice applied;Monitored during session    Home Living                      Prior Function            PT Goals (current goals can now be found in the care plan section) Progress towards PT goals: Progressing toward goals    Frequency  7X/week    PT Plan Current plan remains appropriate    Co-evaluation             End of Session Equipment Utilized During Treatment: Right knee immobilizer Activity Tolerance: Patient tolerated treatment well Patient left: in chair;with call bell/phone within reach     Time: 1107-1130 PT Time Calculation (min): 23 min  Charges:  $Gait Training: 23-37 mins                    G Codes:      Makella Buckingham,KATHrine E  10/05/2014, 12:15 PM Carmelia Bake, PT, DPT 10/05/2014 Pager: [phone]

## 2014-10-05 NOTE — Progress Notes (Signed)
Discharge summary sent to payer through [employer]  

## 2014-10-05 NOTE — Progress Notes (Signed)
Subjective: 3 Days Post-Op Procedure(s) (LRB): IRRIGATION AND DEBRIDEMENT RIGHT KNEE WITH POLY EXCHANGE (Right) Patient reports pain as mild and moderate.   Patient seen in rounds with Dr. Wynelle Link. Patient is well, but has had some minor complaints of pain in the knee, requiring pain medications Patient is ready to go to the SNF today. Will change the IV antibiotic to Rocephin IV Q 12 hours.  Objective: Vital signs in last 24 hours: Temp:  [98.3 F (36.8 C)-99.5 F (37.5 C)] 99.5 F (37.5 C) (10/29 0447) Pulse Rate:  [81-83] 81 (10/29 0447) Resp:  [16-17] 17 (10/29 0447) BP: (117-160)/(65-86) 160/86 mmHg (10/29 0447) SpO2:  [98 %-99 %] 99 % (10/29 0447)  Intake/Output from previous day:  Intake/Output Summary (Last 24 hours) at 10/05/14 0740 Last data filed at 10/05/14 0258  Gross per 24 hour  Intake 1557.75 ml  Output      0 ml  Net 1557.75 ml     Labs:  Recent Labs  10/02/14 1800 10/05/14 0450  HGB 10.9* 8.8*    Recent Labs  10/02/14 1800 10/05/14 0450  WBC 8.7 11.7*  RBC 4.65 3.80*  HCT 34.9* 29.1*  PLT 346 328    Recent Labs  10/02/14 1800 10/05/14 0450  NA  --  135*  K  --  3.2*  CL  --  94*  CO2  --  31  BUN  --  16  CREATININE 0.76 0.73  GLUCOSE  --  178*  CALCIUM  --  8.4   No results found for this basename: LABPT, INR,  in the last 72 hours  EXAM: General - Patient is Alert, Appropriate and Oriented Extremity - Neurovascular intact Sensation intact distally Dorsiflexion/Plantar flexion intact Incision - clean, dry, no drainage, healing, staples intact Motor Function - intact, moving foot and toes well on exam.   Assessment/Plan: 3 Days Post-Op Procedure(s) (LRB): IRRIGATION AND DEBRIDEMENT RIGHT KNEE WITH POLY EXCHANGE (Right) Procedure(s) (LRB): IRRIGATION AND DEBRIDEMENT RIGHT KNEE WITH POLY EXCHANGE (Right) Past Medical History  Diagnosis Date  . Hypertension   . CVA (cerebral infarction)     2006  . Hyperlipidemia     . Blood transfusion without reported diagnosis 2012    anemia  . Heart murmur   . Stroke 2006    x 1  . Nocturia     3-4 times per night  . Complication of anesthesia 06-21-14    trouble waking up after colonscopy  . Family history of anesthesia complication     sister very slow to awaken after anesthesia  . Gout     left elbow  . Sleep apnea     no cpap  . Arthritis     Knee both knees  . Anemia   . 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 08-09-14 Right eye   Active Problems:   Septic arthritis of knee  Estimated body mass index is 36.99 kg/(m^2) as calculated from the following:   Height as of this encounter: 5\' 5"  (1.651 m).   Weight as of this encounter: 100.835 kg (222 lb 4.8 oz). Up with therapy Discharge to SNF Diet - Cardiac diet Follow up - in 2 weeks on Tuesday 10/17/2014 Activity - WBAT right leg, No bending or motion to the right knee, Knee Immobilizer at all times. Disposition - [facility] Condition Upon Discharge - Good D/C Meds - See DC Summary DVT Prophylaxis - Aspirin 325 mg daily  In order  to meet SCIP qualifications, please note that the patient has been continued on IV ABX for infection associated with the right knee.  [name], PA-C Orthopaedic Surgery 10/05/2014, 7:40 AM

## 2014-10-05 NOTE — Discharge Summary (Signed)
Physician Discharge Summary   Patient ID: Beth [name] MRN: [id number] DOB/AGE: Apr 14, 1946 68 y.o.  Admit date: 10/02/2014 Discharge date: 10/05/2014  Primary Diagnosis:  Right knee septic arthritis.  Admission Diagnoses:  Past Medical History  Diagnosis Date  . Hypertension   . CVA (cerebral infarction)     2006  . Hyperlipidemia   . Blood transfusion without reported diagnosis 2012    anemia  . Heart murmur   . Stroke 2006    x 1  . Nocturia     3-4 times per night  . Complication of anesthesia 06-21-14    trouble waking up after colonscopy  . Family history of anesthesia complication     sister very slow to awaken after anesthesia  . Gout     left elbow  . Sleep apnea     no cpap  . Arthritis     Knee both knees  . Anemia   . 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 08-09-14 Right eye   Discharge Diagnoses:   Active Problems:   Septic arthritis of knee  Estimated body mass index is 36.99 kg/(m^2) as calculated from the following:   Height as of this encounter: _0  (1.651 m).   Weight as of this encounter: 100.835 kg (222 lb 4.8 oz).  Procedure:  Procedure(s) (LRB): IRRIGATION AND DEBRIDEMENT RIGHT KNEE WITH POLY EXCHANGE (Right)   Consults: None  HPI: Beth [name] is a 68 year old female, who had a total  knee arthroplasty done on July 27th, this year. She had a fall a few  weeks later and rupture of patella tendon. She had a repair. She had  some drainage prior to the repair and then since repairs had persistent  drainage. We have treated her with oral antibiotics. She never had  fever, chills, or infectious symptoms, but with the persistent drainage,  it is felt that she has a high likelihood of at least low-grade  infection. Her white blood cell count has been normal with her sed rate  and C-reactive protein was slightly elevated, but not grossly elevated.  Given the concerns about the persistent drainage, she presents now for  irrigation and  debridement of polyethylene exchange.  Laboratory Data: Admission on 10/02/2014  Component Date Value Ref Range Status  . Glucose-Capillary 10/02/2014 171* 70 - 99 mg/dL Final  . Comment 1 10/02/2014 Notify RN   Final  . Specimen Description 10/02/2014 SYNOVIAL RIGHT KNEE   Final  . Special Requests 10/02/2014 NONE   Final  . Gram Stain 10/02/2014 PENDING   Incomplete  . Culture 10/02/2014    Final                   Value:NO ANAEROBES ISOLATED; CULTURE IN PROGRESS FOR 5 DAYS                         Performed at [facility]  . Report Status 10/02/2014 PENDING   Incomplete  . Specimen Description 10/02/2014 SYNOVIAL RIGHT KNEE   Final  . Special Requests 10/02/2014 NONE   Final  . Gram Stain 10/02/2014    Final                   Value:NO WBC SEEN                         NO ORGANISMS SEEN  Performed at [facility]  . Culture 10/02/2014    Final                   Value:NO GROWTH 1 DAY                         Performed at [facility]  . Report Status 10/02/2014 PENDING   Incomplete  . Specimen Description 10/02/2014 SYNOVIAL RIGHT KNEE   Final  . Special Requests 10/02/2014 NONE   Final  . Gram Stain 10/02/2014    Final                   Value:NO ORGANISMS SEEN                         MODERATE WBC PRESENT,BOTH PMN AND MONONUCLEAR                         Gram Stain Report Called to,Read Back By and Verified With: LITTLE,L. RN _0  ON 10/02/14 BY MCCOY,N.  . Report Status 10/02/2014 10/02/2014 FINAL   Final  . WBC 10/02/2014 8.7  4.0 - 10.5 K/uL Final  . RBC 10/02/2014 4.65  3.87 - 5.11 MIL/uL Final  . Hemoglobin 10/02/2014 10.9* 12.0 - 15.0 g/dL Final  . HCT 10/02/2014 34.9* 36.0 - 46.0 % Final  . MCV 10/02/2014 75.1* 78.0 - 100.0 fL Final  . MCH 10/02/2014 23.4* 26.0 - 34.0 pg Final  . MCHC 10/02/2014 31.2  30.0 - 36.0 g/dL Final  . RDW 10/02/2014 17.1* 11.5 - 15.5 % Final  . Platelets 10/02/2014 346  150 - 400 K/uL Final  .  Creatinine, Ser 10/02/2014 0.76  0.50 - 1.10 mg/dL Final  . GFR calc non Af Amer 10/02/2014 83* >90 mL/min Final  . GFR calc Af Amer 10/02/2014 >90  >90 mL/min Final   Comment: (NOTE)                          The eGFR has been calculated using the CKD EPI equation.                          This calculation has not been validated in all clinical situations.                          eGFR's persistently <90 mL/min signify possible Chronic Kidney                          Disease.  . Sodium 10/05/2014 135* 137 - 147 mEq/L Final  . Potassium 10/05/2014 3.2* 3.7 - 5.3 mEq/L Final  . Chloride 10/05/2014 94* 96 - 112 mEq/L Final  . CO2 10/05/2014 31  19 - 32 mEq/L Final  . Glucose, Bld 10/05/2014 178* 70 - 99 mg/dL Final  . BUN 10/05/2014 16  6 - 23 mg/dL Final  . Creatinine, Ser 10/05/2014 0.73  0.50 - 1.10 mg/dL Final  . Calcium 10/05/2014 8.4  8.4 - 10.5 mg/dL Final  . GFR calc non Af Amer 10/05/2014 85* >90 mL/min Final  . GFR calc Af Amer 10/05/2014 >90  >90 mL/min Final   Comment: (NOTE)  The eGFR has been calculated using the CKD EPI equation.                          This calculation has not been validated in all clinical situations.                          eGFR's persistently <90 mL/min signify possible Chronic Kidney                          Disease.  . Anion gap 10/05/2014 10  5 - 15 Final  . WBC 10/05/2014 11.7* 4.0 - 10.5 K/uL Final  . RBC 10/05/2014 3.80* 3.87 - 5.11 MIL/uL Final  . Hemoglobin 10/05/2014 8.8* 12.0 - 15.0 g/dL Final  . HCT 10/05/2014 29.1* 36.0 - 46.0 % Final  . MCV 10/05/2014 76.6* 78.0 - 100.0 fL Final  . MCH 10/05/2014 23.2* 26.0 - 34.0 pg Final  . MCHC 10/05/2014 30.2  30.0 - 36.0 g/dL Final  . RDW 10/05/2014 17.7* 11.5 - 15.5 % Final  . Platelets 10/05/2014 328  150 - 400 K/uL Final  Hospital Outpatient Visit on 09/28/2014  Component Date Value Ref Range Status  . MRSA, PCR 09/28/2014 NEGATIVE  NEGATIVE Final  . Staphylococcus  aureus 09/28/2014 NEGATIVE  NEGATIVE Final   Comment:                                 The Xpert SA Assay (FDA                          approved for NASAL specimens                          in patients over 32 years of age),                          is one component of                          a comprehensive surveillance                          program.  Test performance has                          been validated by [employer] for patients greater                          than or equal to 55 year old.                          It is not intended                          to diagnose infection nor to  guide or monitor treatment.  Marland Kitchen aPTT 09/28/2014 32  24 - 37 seconds Final  . WBC 09/28/2014 8.9  4.0 - 10.5 K/uL Final  . RBC 09/28/2014 4.78  3.87 - 5.11 MIL/uL Final  . Hemoglobin 09/28/2014 11.1* 12.0 - 15.0 g/dL Final  . HCT 09/28/2014 36.4  36.0 - 46.0 % Final  . MCV 09/28/2014 76.2* 78.0 - 100.0 fL Final  . MCH 09/28/2014 23.2* 26.0 - 34.0 pg Final  . MCHC 09/28/2014 30.5  30.0 - 36.0 g/dL Final  . RDW 09/28/2014 17.3* 11.5 - 15.5 % Final  . Platelets 09/28/2014 387  150 - 400 K/uL Final  . Sodium 09/28/2014 139  137 - 147 mEq/L Final  . Potassium 09/28/2014 4.6  3.7 - 5.3 mEq/L Final  . Chloride 09/28/2014 99  96 - 112 mEq/L Final  . CO2 09/28/2014 29  19 - 32 mEq/L Final  . Glucose, Bld 09/28/2014 258* 70 - 99 mg/dL Final  . BUN 09/28/2014 15  6 - 23 mg/dL Final  . Creatinine, Ser 09/28/2014 0.88  0.50 - 1.10 mg/dL Final  . Calcium 09/28/2014 9.4  8.4 - 10.5 mg/dL Final  . Total Protein 09/28/2014 7.2  6.0 - 8.3 g/dL Final  . Albumin 09/28/2014 3.1* 3.5 - 5.2 g/dL Final  . AST 09/28/2014 12  0 - 37 U/L Final  . ALT 09/28/2014 10  0 - 35 U/L Final  . Alkaline Phosphatase 09/28/2014 106  39 - 117 U/L Final  . Total Bilirubin 09/28/2014 0.7  0.3 - 1.2 mg/dL Final  . GFR calc non Af Amer 09/28/2014 65* >90 mL/min Final  . GFR  calc Af Amer 09/28/2014 75* >90 mL/min Final   Comment: (NOTE)                          The eGFR has been calculated using the CKD EPI equation.                          This calculation has not been validated in all clinical situations.                          eGFR's persistently <90 mL/min signify possible Chronic Kidney                          Disease.  . Anion gap 09/28/2014 11  5 - 15 Final  . Prothrombin Time 09/28/2014 14.5  11.6 - 15.2 seconds Final  . INR 09/28/2014 1.12  0.00 - 1.49 Final  . Color, Urine 09/28/2014 AMBER* YELLOW Final   BIOCHEMICALS MAY BE AFFECTED BY COLOR  . APPearance 09/28/2014 CLEAR  CLEAR Final  . Specific Gravity, Urine 09/28/2014 1.026  1.005 - 1.030 Final  . pH 09/28/2014 6.5  5.0 - 8.0 Final  . Glucose, UA 09/28/2014 NEGATIVE  NEGATIVE mg/dL Final  . Hgb urine dipstick 09/28/2014 NEGATIVE  NEGATIVE Final  . Bilirubin Urine 09/28/2014 NEGATIVE  NEGATIVE Final  . Ketones, ur 09/28/2014 NEGATIVE  NEGATIVE mg/dL Final  . Protein, ur 09/28/2014 100* NEGATIVE mg/dL Final  . Urobilinogen, UA 09/28/2014 1.0  0.0 - 1.0 mg/dL Final  . Nitrite 09/28/2014 NEGATIVE  NEGATIVE Final  . Leukocytes, UA 09/28/2014 NEGATIVE  NEGATIVE Final  . ABO/RH(D) 09/28/2014 O POS   Final  . Antibody Screen 09/28/2014 NEG   Final  .  Sample Expiration 09/28/2014 10/05/2014   Final  . Squamous Epithelial / LPF 09/28/2014 RARE  RARE Final  . WBC, UA 09/28/2014 0-2  <3 WBC/hpf Final  . RBC / HPF 09/28/2014 0-2  <3 RBC/hpf Final  . Bacteria, UA 09/28/2014 RARE  RARE Final  Admission on 08/11/2014, Discharged on 08/14/2014  Component Date Value Ref Range Status  . WBC 08/11/2014 9.9  4.0 - 10.5 K/uL Final  . RBC 08/11/2014 4.63  3.87 - 5.11 MIL/uL Final  . Hemoglobin 08/11/2014 11.5* 12.0 - 15.0 g/dL Final  . HCT 08/11/2014 36.6  36.0 - 46.0 % Final  . MCV 08/11/2014 79.0  78.0 - 100.0 fL Final  . MCH 08/11/2014 24.8* 26.0 - 34.0 pg Final  . MCHC 08/11/2014 31.4  30.0 - 36.0  g/dL Final  . RDW 08/11/2014 15.8* 11.5 - 15.5 % Final  . Platelets 08/11/2014 526* 150 - 400 K/uL Final  . Sodium 08/11/2014 137  137 - 147 mEq/L Final  . Potassium 08/11/2014 3.8  3.7 - 5.3 mEq/L Final  . Chloride 08/11/2014 94* 96 - 112 mEq/L Final  . CO2 08/11/2014 27  19 - 32 mEq/L Final  . Glucose, Bld 08/11/2014 151* 70 - 99 mg/dL Final  . BUN 08/11/2014 16  6 - 23 mg/dL Final  . Creatinine, Ser 08/11/2014 1.21* 0.50 - 1.10 mg/dL Final  . Calcium 08/11/2014 9.9  8.4 - 10.5 mg/dL Final  . GFR calc non Af Amer 08/11/2014 44* >90 mL/min Final  . GFR calc Af Amer 08/11/2014 51* >90 mL/min Final   Comment: (NOTE)                          The eGFR has been calculated using the CKD EPI equation.                          This calculation has not been validated in all clinical situations.                          eGFR's persistently <90 mL/min signify possible Chronic Kidney                          Disease.  . Anion gap 08/11/2014 16* 5 - 15 Final  . Glucose-Capillary 08/12/2014 181* 70 - 99 mg/dL Final  . Comment 1 08/12/2014 Documented in Chart   Final  . Comment 2 08/12/2014 Notify RN   Final     X-Rays:No results found.  EKG: Orders placed in visit on 05/31/14  . EKG 12-LEAD     Hospital Course: Beth [name] is a 68 y.o. who was admitted to [facility]. They were brought to the operating room on 10/02/2014 and underwent Procedure(s): IRRIGATION AND DEBRIDEMENT RIGHT KNEE WITH POLY EXCHANGE.  Patient tolerated the procedure well and was later transferred to the recovery room and then to the orthopaedic floor for postoperative care.  They were given PO and IV analgesics for pain control following their surgery.  They were given 24 hours of postoperative antibiotics of  Anti-infectives   Start     Dose/Rate Route Frequency Ordered Stop   10/05/14 1000  cefTRIAXone (ROCEPHIN) 1 g in dextrose 5 % 50 mL IVPB     1 g 100 mL/hr over 30 Minutes Intravenous Every 12 hours  10/05/14 0710  10/05/14 0000  cefTRIAXone 1 g in dextrose 5 % 50 mL     1 g 100 mL/hr over 30 Minutes Intravenous Every 12 hours 10/05/14 0801     10/03/14 0800  ceFAZolin (ANCEF) IVPB 2 g/50 mL premix  Status:  Discontinued     2 g 100 mL/hr over 30 Minutes Intravenous Every 6 hours 10/03/14 0747 10/05/14 0710   10/02/14 2000  ceFAZolin (ANCEF) IVPB 2 g/50 mL premix  Status:  Discontinued     2 g 100 mL/hr over 30 Minutes Intravenous Every 6 hours 10/02/14 1745 10/03/14 0747   10/02/14 1044  ceFAZolin (ANCEF) IVPB 2 g/50 mL premix     2 g 100 mL/hr over 30 Minutes Intravenous On call to O.R. 10/02/14 1044 10/02/14 1358     and started on DVT prophylaxis in the form of Lovenox but switched over to an Aspirin due to previous bleeding issues. She remained on IV antibiotics daily.  PT and OT were ordered for gait training and ADL's.  Discharge planning consulted to help with postop disposition and equipment needs. It was felt that the patient would need SNF for a short time postop.  Patient had a decent night on the evening of surgery.  They started to get up OOB with therapy on day one. Hemovac drain was pulled without difficulty on day two.  Continued to work with therapy into day two.  Dressing was changed on day two and the incision was healing well with staples intact.  By day three, the patient had progressed with therapy and meeting their goals.  Incision was healing well. She was switched over to Rocephin IV every 12 hours via PICC line on day three. Patient was seen in rounds and was ready to go to the SNF - [facility].  Discharge to SNF  Diet - Cardiac diet  Follow up - in 2 weeks on Tuesday 10/17/2014  Activity - WBAT right leg, No bending or motion to the right knee, Knee Immobilizer at all times.  Disposition - [facility]  Condition Upon Discharge - Good  D/C Meds - See DC Summary  DVT Prophylaxis - Aspirin 325 mg daily       Discharge  Instructions   Call MD / Call 911    Complete by:  As directed   If you experience chest pain or shortness of breath, CALL 911 and be transported to the hospital emergency room.  If you develope a fever above 101 F, pus (white drainage) or increased drainage or redness at the wound, or calf pain, call your surgeon's office.     Change dressing    Complete by:  As directed   Change dressing daily with sterile 4 x 4 inch gauze dressing and apply TED hose. Do not submerge the incision under water.     Constipation Prevention    Complete by:  As directed   Drink plenty of fluids.  Prune juice may be helpful.  You may use a stool softener, such as Colace (over the counter) 100 mg twice a day.  Use MiraLax (over the counter) for constipation as needed.     Diet - low sodium heart healthy    Complete by:  As directed      Discharge instructions    Complete by:  As directed   Pick up stool softner and laxative for home. Do not submerge incision under water. May shower starting Thursday 10/05/2014. Continue to use ice for  pain and swelling from surgery. Knee Immobilizer At All Times No bending or motion to the right knee. May be WBAT to the right leg.  IV Rocephin 1 gram every 12 hours for a total of 4 weeks.  Full dose enteric-coated 325 mg Aspirin daily for four weeks and then my switch back to the home dose 81 mg baby Aspirin     Do not put a pillow under the knee. Place it under the heel.    Complete by:  As directed      Do not sit on low chairs, stoools or toilet seats, as it may be difficult to get up from low surfaces    Complete by:  As directed      Driving restrictions    Complete by:  As directed   No driving until released by the physician.     Increase activity slowly as tolerated    Complete by:  As directed      Lifting restrictions    Complete by:  As directed   No lifting until released by the physician.     Patient may shower    Complete by:  As directed   You may  shower without a dressing once there is no drainage.  Do not wash over the wound.  If drainage remains, do not shower until drainage stops.     TED hose    Complete by:  As directed   Use stockings (TED hose) for 3 weeks on both leg(s).  You may remove them at night for sleeping.     Weight bearing as tolerated    Complete by:  As directed   Laterality:  right  Extremity:  Lower  Knee Immobilizer At All Times to the right leg No bending or motion to the right knee. May be WBAT to the right leg.            Medication List    STOP taking these medications       aspirin EC 81 MG tablet  Replaced by:  aspirin 325 MG tablet     cephALEXin 500 MG capsule  Commonly known as:  KEFLEX     COD LIVER OIL PO     multivitamin with minerals Tabs tablet      TAKE these medications       amLODipine 10 MG tablet  Commonly known as:  NORVASC  Take 1 tablet (10 mg total) by mouth every evening.     aspirin 325 MG tablet  Take 1 tablet (325 mg total) by mouth daily. Take daily for four weeks and then switch back to the home 81 mg baby Aspirin daily.     atorvastatin 20 MG tablet  Commonly known as:  LIPITOR  Take 20 mg by mouth every evening.     bisacodyl 10 MG suppository  Commonly known as:  DULCOLAX  Place 1 suppository (10 mg total) rectally daily as needed for moderate constipation.     cefTRIAXone 1 g in dextrose 5 % 50 mL  Inject 1 g into the vein every 12 (twelve) hours. Take 1 gram IV every 12 hours for four weeks via PICC line.     DSS 100 MG Caps  Take 100 mg by mouth 2 (two) times daily.     furosemide 80 MG tablet  Commonly known as:  LASIX  Take 80 mg by mouth every other day.     methocarbamol 500 MG tablet  Commonly known as:  ROBAXIN  Take 1 tablet (500 mg total) by mouth every 6 (six) hours as needed for muscle spasms.     metoCLOPramide 5 MG tablet  Commonly known as:  REGLAN  Take 1-2 tablets (5-10 mg total) by mouth every 8 (eight) hours as needed for  nausea (if ondansetron (ZOFRAN) ineffective.).     nebivolol 10 MG tablet  Commonly known as:  BYSTOLIC  Take 10 mg by mouth every morning.     olmesartan-hydrochlorothiazide 40-25 MG per tablet  Commonly known as:  BENICAR HCT  Take 1 tablet by mouth every morning.     ondansetron 4 MG tablet  Commonly known as:  ZOFRAN  Take 1 tablet (4 mg total) by mouth every 6 (six) hours as needed for nausea.     oxyCODONE 5 MG immediate release tablet  Commonly known as:  Oxy IR/ROXICODONE  Take 1-2 tablets (5-10 mg total) by mouth every 3 (three) hours as needed for moderate pain, severe pain or breakthrough pain.     polyethylene glycol packet  Commonly known as:  MIRALAX / GLYCOLAX  Take 17 g by mouth daily as needed for mild constipation.     sodium chloride 0.65 % Soln nasal spray  Commonly known as:  OCEAN  Place 1 spray into both nostrils 2 (two) times daily as needed for congestion.     traMADol 50 MG tablet  Commonly known as:  ULTRAM  Take 1-2 tablets (50-100 mg total) by mouth every 6 (six) hours as needed (mild pain).       Follow-up Information   Follow up with Gearlean Alf, MD. Schedule an appointment as soon as possible for a visit on 10/12/2014. (Please call office at [phone] to help set up follow up appointment on next Thursday 10/12/2014 for this patient.)    Specialty:  Orthopedic Surgery   Contact information:   [address] [place] [address] [phone]       Signed: [name], PA-C Orthopaedic Surgery 10/05/2014, 8:03 AM

## 2014-10-05 NOTE — Discharge Instructions (Signed)
Pick up stool softner and laxative for home. Do not submerge incision under water. May shower starting Thursday 10/05/2014. Continue to use ice for pain and swelling from surgery.  Knee Immobilizer At All Times No bending or motion to the right knee. May be WBAT to the right leg.  IV Rocephin 1 gram every 12 hours for a total of 4 weeks via PICC LINE.  Full dose enteric-coated 325 mg Aspirin daily for four weeks and then my switch back to the home dose 81 mg baby Aspirin.

## 2014-10-06 ENCOUNTER — Non-Acute Institutional Stay (SKILLED_NURSING_FACILITY): Payer: Medicare Other | Admitting: Adult Health

## 2014-10-06 ENCOUNTER — Encounter: Payer: Self-pay | Admitting: Adult Health

## 2014-10-06 DIAGNOSIS — E785 Hyperlipidemia, unspecified: Secondary | ICD-10-CM

## 2014-10-06 DIAGNOSIS — I10 Essential (primary) hypertension: Secondary | ICD-10-CM

## 2014-10-06 DIAGNOSIS — D62 Acute posthemorrhagic anemia: Secondary | ICD-10-CM

## 2014-10-06 DIAGNOSIS — M009 Pyogenic arthritis, unspecified: Secondary | ICD-10-CM

## 2014-10-06 LAB — BODY FLUID CULTURE
Culture: NO GROWTH
GRAM STAIN: NONE SEEN

## 2014-10-06 NOTE — Progress Notes (Signed)
Patient ID: Beth [name], female   DOB: 10/30/1946, 68 y.o.   MRN: [id number]   10/06/2014  Facility:  Nursing Home Location:  [place] Room Number: 805-2 LEVEL OF CARE:  SNF (31)   Chief Complaint  Patient presents with  . New Admit To SNF    HISTORY OF PRESENT ILLNESS:  This is a 68 year old female who has been admitted to [facility] on 10/05/14 from [facility] with Right knee septic arthritis S/P Irrigation and debridement. She has been admitted for a short-term rehabilitation.  REASSESSMENT OF ONGOING PROBLEMS:  HYPERLIPIDEMIA: No complications from the medications presently being used. 1/15 fasting lipid panel showed : Cholesterol 175 triglyceride 67 HDL 67 LDL 95  ANEMIA: The anemia has been stable. The patient denies fatigue, melena or hematochezia. No complications from the medications currently being used. 10/15 hgb 8.8  HTN:  Denies CP, sob, DOE, headaches, dizziness or visual disturbances.  No complications from the medications currently being used.  Last BP : 142/68  PAST MEDICAL HISTORY:  Past Medical History  Diagnosis Date  . Hypertension   . CVA (cerebral infarction)     2006  . Hyperlipidemia   . Blood transfusion without reported diagnosis 2012    anemia  . Heart murmur   . Stroke 2006    x 1  . Nocturia     3-4 times per night  . Complication of anesthesia 06-21-14    trouble waking up after colonscopy  . Family history of anesthesia complication     sister very slow to awaken after anesthesia  . Gout     left elbow  . Sleep apnea     no cpap  . Arthritis     Knee both knees  . Anemia   . 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 08-09-14 Right eye    CURRENT MEDICATIONS: Reviewed per MAR/see medication list  Allergies  Allergen Reactions  . Clonidine Derivatives Other (See Comments)    Dizziness and weakness  . Shellfish Allergy Nausea And Vomiting     REVIEW OF SYSTEMS:  GENERAL: no  change in appetite, no fatigue, no weight changes, no fever, chills or weakness RESPIRATORY: no cough, SOB, DOE, wheezing, hemoptysis CARDIAC: no chest pain, or palpitations GI: no abdominal pain, diarrhea, constipation, heart burn, nausea or vomiting  PHYSICAL EXAMINATION  GENERAL: no acute distress, obese EYES: conjunctivae normal, sclerae normal, normal eye lids NECK: supple, trachea midline, no neck masses, no thyroid tenderness, no thyromegaly LYMPHATICS: no LAN in the neck, no supraclavicular LAN RESPIRATORY: breathing is even & unlabored, BS CTAB CARDIAC: RRR, no murmur,no extra heart sounds, RLE edema  2+ GI: abdomen soft, normal BS, no masses, no tenderness, no hepatomegaly, no splenomegaly EXTREMITIES:  Able to move all 4 extremities; + right knee immobilizer, RUE SL PICC PSYCHIATRIC: the patient is alert & oriented to person, affect & behavior appropriate  LABS/RADIOLOGY: Labs reviewed: Basic Metabolic Panel:  Recent Labs  08/11/14 1230 09/28/14 1500 10/02/14 1800 10/05/14 0450  NA 137 139  --  135*  K 3.8 4.6  --  3.2*  CL 94* 99  --  94*  CO2 27 29  --  31  GLUCOSE 151* 258*  --  178*  BUN 16 15  --  16  CREATININE 1.21* 0.88 0.76 0.73  CALCIUM 9.9 9.4  --  8.4   Liver Function Tests:  Recent Labs  03/31/14 1534 06/27/14 1520 09/28/14  1500  AST 17 15 12   ALT 15 14 10   ALKPHOS 114 122* 106  BILITOT 1.1 0.9 0.7  PROT 7.2 8.1 7.2  ALBUMIN  --  4.1 3.1*   CBC:  Recent Labs  09/28/14 1500 10/02/14 1800 10/05/14 0450  WBC 8.9 8.7 11.7*  HGB 11.1* 10.9* 8.8*  HCT 36.4 34.9* 29.1*  MCV 76.2* 75.1* 76.6*  PLT 387 346 328   Lipid Panel:  Recent Labs  12/30/13 1310 03/31/14 1534  HDL 67 62   CBG:  Recent Labs  08/12/14 1743 10/02/14 1036  GLUCAP 181* 171*    ASSESSMENT/PLAN:  Right knee septic arthritis status post irrigation and debridement - continue ceftriaxone; for rehabilitation Hypertension - continue Amlodipine and  Losartan Hyperlipidemia - continue Lipitor Anemia, acute blood loss -  Check CBC    CPT CODE: 49826    MEDINA-VARGAS,Demoni Parmar, [facility] [phone]

## 2014-10-07 LAB — ANAEROBIC CULTURE: Gram Stain: NONE SEEN

## 2014-10-09 ENCOUNTER — Non-Acute Institutional Stay (SKILLED_NURSING_FACILITY): Payer: Medicare Other | Admitting: Internal Medicine

## 2014-10-09 ENCOUNTER — Encounter: Payer: Self-pay | Admitting: Internal Medicine

## 2014-10-09 DIAGNOSIS — R5381 Other malaise: Secondary | ICD-10-CM

## 2014-10-09 DIAGNOSIS — D72829 Elevated white blood cell count, unspecified: Secondary | ICD-10-CM

## 2014-10-09 DIAGNOSIS — E871 Hypo-osmolality and hyponatremia: Secondary | ICD-10-CM

## 2014-10-09 DIAGNOSIS — D62 Acute posthemorrhagic anemia: Secondary | ICD-10-CM

## 2014-10-09 DIAGNOSIS — K5900 Constipation, unspecified: Secondary | ICD-10-CM

## 2014-10-09 DIAGNOSIS — E876 Hypokalemia: Secondary | ICD-10-CM

## 2014-10-09 DIAGNOSIS — E785 Hyperlipidemia, unspecified: Secondary | ICD-10-CM

## 2014-10-09 DIAGNOSIS — M009 Pyogenic arthritis, unspecified: Secondary | ICD-10-CM

## 2014-10-09 DIAGNOSIS — I10 Essential (primary) hypertension: Secondary | ICD-10-CM

## 2014-10-09 NOTE — Progress Notes (Signed)
Patient ID: Beth [name], female   DOB: 1946-03-06, 68 y.o.   MRN: [id number]     [facility]   PCP: Erby Pian, FNP  Code Status: full code  Allergies  Allergen Reactions  . Clonidine Derivatives Other (See Comments)    Dizziness and weakness  . Shellfish Allergy Nausea And Vomiting    Chief Complaint: new admission  HPI:  68 y/o female patient is here for STR after hospital admission from 10/02/14-10/05/14 with septic arthritis of right knee. She had a fall few weeks prior to hospital admission and ruptured her patellar tendon. She underwent repair but had drainage and infection post repair. She was put on oral antibiotics but with no improvement of clinical picture, she underwent irrigation and debridement of right knee with poly exchange. She tolerated the procedure well and was sent to SNF for rehabilitation. She has PMH of HTN, constipation, OA, HLD She was seen in her room today. Staff had noticed some blood drainage from one of her staple site and following this patient has refused to take aspirin. She mentions that aspirin has made her have bleeding episodes in the past. Her bp readings on review are elevated. She denies any headache, abdominal pain, chest pain and dyspnea As per staff, patient has been eating good, her bowel movement is regular. No new skin concerns or behavior changes. She is on iv antibiotics and remains afebrile  Review of Systems:  Constitutional: Negative for fever, chills, malaise/fatigue and diaphoresis.  HENT: Negative for congestion Eyes: Negative for eye pain, blurred vision, double vision and discharge.  Respiratory: Negative for cough, sputum production, shortness of breath and wheezing.   Cardiovascular: Negative for chest pain, palpitations, orthopnea and leg swelling.  Gastrointestinal: Negative for heartburn, nausea, vomiting, abdominal pain, diarrhea and constipation.  Genitourinary: Negative for  dysuria, urgency, frequency, hematuria and flank pain.  Musculoskeletal: Negative for back pain, falls. Has joint pain and current pain regimen is helpful  Skin: Negative for itching and rash.  Neurological: Negative for dizziness, tingling, focal weakness and headaches.  Psychiatric/Behavioral: Negative for depression.    Past Medical History  Diagnosis Date  . Hypertension   . CVA (cerebral infarction)     2006  . Hyperlipidemia   . Blood transfusion without reported diagnosis 2012    anemia  . Heart murmur   . Stroke 2006    x 1  . Nocturia     3-4 times per night  . Complication of anesthesia 06-21-14    trouble waking up after colonscopy  . Family history of anesthesia complication     sister very slow to awaken after anesthesia  . Gout     left elbow  . Sleep apnea     no cpap  . Arthritis     Knee both knees  . Anemia   . 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 08-09-14 Right eye   Past Surgical History  Procedure Laterality Date  . Carpal tunnel release Right 1983  . Abdominal hysterectomy  1983  . Colonscopy  June 21, 2014  . Nasal cauterization  2012  . Total knee arthroplasty Right 07/03/2014    Procedure: RIGHT TOTAL KNEE ARTHROPLASTY;  Surgeon: Gearlean Alf, MD;  Location: [facility];  Service: Orthopedics;  Laterality: Right;  . Patellar tendon repair Right 08/11/2014    Procedure: RIGHT PATELLA TENDON REPAIR;  Surgeon: Gearlean Alf, MD;  Location: [facility];  Service: Orthopedics;  Laterality: Right;  . Joint replacement  06/2014    right knee  . I&d knee with poly exchange Right 10/02/2014    Procedure: IRRIGATION AND DEBRIDEMENT RIGHT KNEE WITH POLY EXCHANGE;  Surgeon: Gearlean Alf, MD;  Location: [facility];  Service: Orthopedics;  Laterality: Right;   Social History:   reports that she has never smoked. She has never used smokeless tobacco. She reports that she does not drink alcohol or use illicit drugs.  Family History  Problem Relation Age of  Onset  . Brain cancer Mother   . Ovarian cancer Mother   . Diabetes Son   . Diabetes Son   . Colon cancer Neg Hx   . Esophageal cancer Neg Hx   . Stomach cancer Neg Hx   . Rectal cancer Neg Hx     Medications: Patient's Medications  New Prescriptions   No medications on file  Previous Medications   AMLODIPINE (NORVASC) 10 MG TABLET    Take 1 tablet (10 mg total) by mouth every evening.   ASPIRIN 325 MG TABLET    Take 1 tablet (325 mg total) by mouth daily. Take daily for four weeks and then switch back to the home 81 mg baby Aspirin daily.   ATORVASTATIN (LIPITOR) 20 MG TABLET    Take 20 mg by mouth every evening.   BISACODYL (DULCOLAX) 10 MG SUPPOSITORY    Place 1 suppository (10 mg total) rectally daily as needed for moderate constipation.   CEFTRIAXONE 1 G IN DEXTROSE 5 % 50 ML    Inject 1 g into the vein every 12 (twelve) hours. Take 1 gram IV every 12 hours for four weeks via PICC line.   DOCUSATE SODIUM 100 MG CAPS    Take 100 mg by mouth 2 (two) times daily.   FUROSEMIDE (LASIX) 80 MG TABLET    Take 80 mg by mouth every other day.    METHOCARBAMOL (ROBAXIN) 500 MG TABLET    Take 1 tablet (500 mg total) by mouth every 6 (six) hours as needed for muscle spasms.   METOCLOPRAMIDE (REGLAN) 5 MG TABLET    Take 1-2 tablets (5-10 mg total) by mouth every 8 (eight) hours as needed for nausea (if ondansetron (ZOFRAN) ineffective.).   NEBIVOLOL (BYSTOLIC) 10 MG TABLET    Take 10 mg by mouth every morning.   OLMESARTAN-HYDROCHLOROTHIAZIDE (BENICAR HCT) 40-25 MG PER TABLET    Take 1 tablet by mouth every morning.   ONDANSETRON (ZOFRAN) 4 MG TABLET    Take 1 tablet (4 mg total) by mouth every 6 (six) hours as needed for nausea.   OXYCODONE (OXY IR/ROXICODONE) 5 MG IMMEDIATE RELEASE TABLET    Take 1-2 tablets (5-10 mg total) by mouth every 3 (three) hours as needed for moderate pain, severe pain or breakthrough pain.   POLYETHYLENE GLYCOL (MIRALAX / GLYCOLAX) PACKET    Take 17 g by mouth daily  as needed for mild constipation.   SODIUM CHLORIDE (OCEAN) 0.65 % SOLN NASAL SPRAY    Place 1 spray into both nostrils 2 (two) times daily as needed for congestion.   TRAMADOL (ULTRAM) 50 MG TABLET    Take 1-2 tablets (50-100 mg total) by mouth every 6 (six) hours as needed (mild pain).  Modified Medications   No medications on file  Discontinued Medications   No medications on file     Physical Exam: Filed Vitals:   10/09/14 1129  BP: 152/88  Pulse: 88  Temp: 99 F (37.2 C)  Resp: 20  SpO2: 95%    General- elderly female in no acute distress Head- atraumatic, normocephalic Eyes- PERRLA, EOMI, no pallor, no icterus, no discharge Neck- no lymphadenopathy Throat- moist mucus membrane, normal oropharynx Nose- normal nasal mucosa Cardiovascular- normal s1,s2, no murmurs Respiratory- bilateral clear to auscultation, no wheeze, no rhonchi, no crackles, no use of accessory muscles Abdomen- bowel sounds present, soft, non tender, no CVA tenderness Musculoskeletal- able to move all 4 extremities, right leg in knee immobilizer, trace edema Neurological- no focal deficit Skin- warm and dry, incision healing well with staples in place in right knee, blood drainage from one of the staple site, no signs of infection, picc line in right arm, site clean Psychiatry- alert and oriented to person, place and time, normal mood and affect   Labs reviewed: Basic Metabolic Panel:  Recent Labs  08/11/14 1230 09/28/14 1500 10/02/14 1800 10/05/14 0450  NA 137 139  --  135*  K 3.8 4.6  --  3.2*  CL 94* 99  --  94*  CO2 27 29  --  31  GLUCOSE 151* 258*  --  178*  BUN 16 15  --  16  CREATININE 1.21* 0.88 0.76 0.73  CALCIUM 9.9 9.4  --  8.4   Liver Function Tests:  Recent Labs  03/31/14 1534 06/27/14 1520 09/28/14 1500  AST 17 15 12   ALT 15 14 10   ALKPHOS 114 122* 106  BILITOT 1.1 0.9 0.7  PROT 7.2 8.1 7.2  ALBUMIN  --  4.1 3.1*   No results for input(s): LIPASE, AMYLASE in the  last 8760 hours. No results for input(s): AMMONIA in the last 8760 hours. CBC:  Recent Labs  09/28/14 1500 10/02/14 1800 10/05/14 0450  WBC 8.9 8.7 11.7*  HGB 11.1* 10.9* 8.8*  HCT 36.4 34.9* 29.1*  MCV 76.2* 75.1* 76.6*  PLT 387 346 328   Cardiac Enzymes: No results for input(s): CKTOTAL, CKMB, CKMBINDEX, TROPONINI in the last 8760 hours. BNP: Invalid input(s): POCBNP CBG:  Recent Labs  08/12/14 1743 10/02/14 1036  GLUCAP 181* 171*    Assessment/Plan  Physical deconditioning Will have her work with physical therapy and occupational therapy team to help with gait training and muscle strengthening exercises.fall precautions. Skin care. Encourage to be out of bed.   Right knee septic arthritis S/p irrigation and debridement. Has bleeding from one of staple site, pressure dressing to be applied. Check cbc today. D/c aspirin for now. Continue prn robaxin for muscle spasm and oxy ir 5 mg 1-2 tab q3h prn pain with tramadol. Continue PT and OT  Hypokalemia Pt is on lasix, low k level on discharge, start kcl 20 meq daily for now, check bmp tomorrow and adjust dose further if needed  Anemia likely post op and with her ongoing bleed at incision site along with aspirin. Check cbc today. If hb < 7, will need transfusion. D/c aspirin for now  Leukocytosis Likely from her infection. On ceftriaxone x 4 weeks. Recheck cbc with diff in a week. Remains afebrile and incision site appears clean  Hyponatremia Recheck bmp, stable mentation  Hypertension On norvasc 10 mg daily, bystolic 10 mg daily, lasix 80 mg qod, beincar-HCT 40-25 daily. bp elevated. Start hydralazine 25 mg bid and reassess. Check bp q shift for now  HLD Continue lipitor 20 mg daily  Constipation Continue colace 100 mg bid, miralax prn and dulcolax prn  Family/ staff Communication: reviewed care plan with patient and nursing supervisor  Goals of care:  short term rehabilitation    Labs/tests ordered: cbc  stat today, cmp with mg next draw and cbc with diff in 1 week    Blanchie Serve, MD  [place] (Monday-Friday 8 am - 5 pm) [phone] (afterhours)

## 2014-10-11 ENCOUNTER — Non-Acute Institutional Stay (SKILLED_NURSING_FACILITY): Payer: Medicare Other | Admitting: Adult Health

## 2014-10-11 ENCOUNTER — Encounter: Payer: Self-pay | Admitting: Adult Health

## 2014-10-11 NOTE — Progress Notes (Signed)
Patient ID: Beth [name], female   DOB: 09-26-1946, 68 y.o.   MRN: [id number]   10/11/2014  Facility:  [facility]ursing Home Location:  [place] Room Number: 805-2 LEVEL OF CARE:  SNF (31)   Chief Complaint  Patient presents with  . Acute Visit    Hypomagnesemia    HISTORY OF PRESENT ILLNESS:  This is a 68 year old female who has been admitted to [facility] on 10/05/14 from [facility] with Right knee septic arthritis S/P Irrigation and debridement. She was noted to have Mg 1.6 - low. No complaints of weakness.  PAST MEDICAL HISTORY:  Past Medical History  Diagnosis Date  . Hypertension   . CVA (cerebral infarction)     2006  . Hyperlipidemia   . Blood transfusion without reported diagnosis 2012    anemia  . Heart murmur   . Stroke 2006    x 1  . Nocturia     3-4 times per night  . Complication of anesthesia 06-21-14    trouble waking up after colonscopy  . Family history of anesthesia complication     sister very slow to awaken after anesthesia  . Gout     left elbow  . Sleep apnea     no cpap  . Arthritis     Knee both knees  . Anemia   . 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 08-09-14 Right eye    CURRENT MEDICATIONS: Reviewed per MAR/see medication list  Allergies  Allergen Reactions  . Clonidine Derivatives Other (See Comments)    Dizziness and weakness  . Shellfish Allergy Nausea And Vomiting     REVIEW OF SYSTEMS:  GENERAL: no change in appetite, no fatigue, no weight changes, no fever, chills or weakness RESPIRATORY: no cough, SOB, DOE, wheezing, hemoptysis CARDIAC: no chest pain, or palpitations, +edema GI: no abdominal pain, diarrhea, constipation, heart burn, nausea or vomiting  PHYSICAL EXAMINATION  GENERAL: no acute distress, obese NECK: supple, trachea midline, no neck masses, no thyroid tenderness, no thyromegaly LYMPHATICS: no LAN in the neck, no supraclavicular LAN RESPIRATORY: breathing is even  & unlabored, BS CTAB CARDIAC: RRR, no murmur,no extra heart sounds, RLE edema  2+ GI: abdomen soft, normal BS, no masses, no tenderness, no hepatomegaly, no splenomegaly EXTREMITIES:  Able to move all 4 extremities; + right knee immobilizer, RUE SL PICC PSYCHIATRIC: the patient is alert & oriented to person, affect & behavior appropriate  LABS/RADIOLOGY: 10/10/14  Sodium 133 potassium 3.7 glucose 220 BUN 12 creatinine 0.7 calcium 9.1 total protein 5.8 albumin 3.3 AST 10 ALT 4 magnesium 1.6  10/09/14  WBC 10.2 hemoglobin 8.8 hematocrit 29.0 MCV 78.0 Labs reviewed: Basic Metabolic Panel:  Recent Labs  08/11/14 1230 09/28/14 1500 10/02/14 1800 10/05/14 0450  NA 137 139  --  135*  K 3.8 4.6  --  3.2*  CL 94* 99  --  94*  CO2 27 29  --  31  GLUCOSE 151* 258*  --  178*  BUN 16 15  --  16  CREATININE 1.21* 0.88 0.76 0.73  CALCIUM 9.9 9.4  --  8.4   Liver Function Tests:  Recent Labs  03/31/14 1534 06/27/14 1520 09/28/14 1500  AST 17 15 12   ALT 15 14 10   ALKPHOS 114 122* 106  BILITOT 1.1 0.9 0.7  PROT 7.2 8.1 7.2  ALBUMIN  --  4.1 3.1*   CBC:  Recent Labs  09/28/14 1500 10/02/14 1800  10/05/14 0450  WBC 8.9 8.7 11.7*  HGB 11.1* 10.9* 8.8*  HCT 36.4 34.9* 29.1*  MCV 76.2* 75.1* 76.6*  PLT 387 346 328   Lipid Panel:  Recent Labs  12/30/13 1310 03/31/14 1534  HDL 67 62   CBG:  Recent Labs  08/12/14 1743 10/02/14 1036  GLUCAP 181* 171*    ASSESSMENT/PLAN:  Hypomagnesemia - start magnesium oxide 400 mg 1 tab by mouth daily; admission level on 10/16/14  CPT CODE: 90931    MEDINA-VARGAS,[place], [place]

## 2014-10-20 ENCOUNTER — Encounter: Payer: Self-pay | Admitting: Adult Health

## 2014-10-20 ENCOUNTER — Non-Acute Institutional Stay (SKILLED_NURSING_FACILITY): Payer: Medicare Other | Admitting: Adult Health

## 2014-10-20 DIAGNOSIS — E785 Hyperlipidemia, unspecified: Secondary | ICD-10-CM

## 2014-10-20 DIAGNOSIS — E876 Hypokalemia: Secondary | ICD-10-CM

## 2014-10-20 DIAGNOSIS — I10 Essential (primary) hypertension: Secondary | ICD-10-CM

## 2014-10-20 DIAGNOSIS — K5900 Constipation, unspecified: Secondary | ICD-10-CM

## 2014-10-20 DIAGNOSIS — D62 Acute posthemorrhagic anemia: Secondary | ICD-10-CM

## 2014-10-20 DIAGNOSIS — M009 Pyogenic arthritis, unspecified: Secondary | ICD-10-CM

## 2014-10-20 NOTE — Progress Notes (Signed)
Patient ID: Beth [name], female   DOB: 09/21/1946, 68 y.o.   MRN: [id number]   10/20/2014  Facility:  Nursing Home Location:  [place] Room Number: 805-2 LEVEL OF CARE:  SNF (31)   Chief Complaint  Patient presents with  . Discharge Note    Right knee septic arthritis S/P Irrigation and Debridement, Hypertension, Constipation, Anemia, Hypokalemia and Hyperlipidemia    HISTORY OF PRESENT ILLNESS:  This is a 68 year old female who is for discharge home with Home health PT, OT and Nursing. She has been admitted to [facility] on 10/05/14 from [facility] with Right knee septic arthritis S/P Irrigation and Debridement. She will continue Rocephin IV X 10 more days upon discharge home. Patient was admitted to this facility for short-term rehabilitation after the patient's recent hospitalization.  Patient has completed SNF rehabilitation and therapy has cleared the patient for discharge.  REASSESSMENT OF ONGOING PROBLEMS:  HTN: Pt 's HTN remains stable.  Denies CP, sob, DOE, headaches, dizziness or visual disturbances.  No complications from the medications currently being used.  Last BP : 122/79  ANEMIA: The anemia has been stable. The patient denies fatigue, melena or hematochezia.  11/15  hgb 10.0  HYPERLIPIDEMIA: No complications from the medications presently being used. 4/15 fasting lipid panel showed :cholesterol 165 triglycerides 75 HDL 62 LDL 88  PAST MEDICAL HISTORY:  Past Medical History  Diagnosis Date  . Hypertension   . CVA (cerebral infarction)     2006  . Hyperlipidemia   . Blood transfusion without reported diagnosis 2012    anemia  . Heart murmur   . Stroke 2006    x 1  . Nocturia     3-4 times per night  . Complication of anesthesia 06-21-14    trouble waking up after colonscopy  . Family history of anesthesia complication     sister very slow to awaken after anesthesia  . Gout     left elbow  . Sleep apnea     no  cpap  . Arthritis     Knee both knees  . Anemia   . 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 08-09-14 Right eye    CURRENT MEDICATIONS: Reviewed per MAR/see medication list  Allergies  Allergen Reactions  . Clonidine Derivatives Other (See Comments)    Dizziness and weakness  . Shellfish Allergy Nausea And Vomiting    REVIEW OF SYSTEMS:  GENERAL: no change in appetite, no fatigue, no weight changes, no fever, chills or weakness RESPIRATORY: no cough, SOB, DOE, wheezing, hemoptysis CARDIAC: no chest pain, edema or palpitations GI: no abdominal pain, diarrhea, constipation, heart burn, nausea or vomiting  PHYSICAL EXAMINATION  GENERAL: no acute distress, normal body habitus EYES: conjunctivae normal, sclerae normal, normal eye lids NECK: supple, trachea midline, no neck masses, no thyroid tenderness, no thyromegaly LYMPHATICS: no LAN in the neck, no supraclavicular LAN RESPIRATORY: breathing is even & unlabored, BS CTAB CARDIAC: RRR, no murmur,no extra heart sounds, BLE edema 1+ GI: abdomen soft, normal BS, no masses, no tenderness, no hepatomegaly, no splenomegaly EXTREMITIES: able to move all 4 extremities PSYCHIATRIC: the patient is alert & oriented to person, affect & behavior appropriate  LABS/RADIOLOGY: Labs reviewed: Basic Metabolic Panel:  Recent Labs  08/11/14 1230 09/28/14 1500 10/02/14 1800 10/05/14 0450  NA 137 139  --  135*  K 3.8 4.6  --  3.2*  CL 94* 99  --  94*  CO2  27 29  --  31  GLUCOSE 151* 258*  --  178*  BUN 16 15  --  16  CREATININE 1.21* 0.88 0.76 0.73  CALCIUM 9.9 9.4  --  8.4   Liver Function Tests:  Recent Labs  03/31/14 1534 06/27/14 1520 09/28/14 1500  AST 17 15 12   ALT 15 14 10   ALKPHOS 114 122* 106  BILITOT 1.1 0.9 0.7  PROT 7.2 8.1 7.2  ALBUMIN  --  4.1 3.1*   CBC:  Recent Labs  09/28/14 1500 10/02/14 1800 10/05/14 0450  WBC 8.9 8.7 11.7*  HGB 11.1* 10.9* 8.8*  HCT 36.4 34.9* 29.1*  MCV 76.2* 75.1* 76.6*    PLT 387 346 328   Lipid Panel:  Recent Labs  12/30/13 1310 03/31/14 1534  HDL 67 62   CBG:  Recent Labs  08/12/14 1743 10/02/14 1036  GLUCAP 181* 171*    ASSESSMENT/PLAN:  Right knee septic arthritis is status post irrigation and debridement - continue Rocephin 1 g IV every 12 hours 10 more days upon discharge; continue Robaxin 500 mg 1 tab by mouth every 6 hours when necessary for muscle spasm; continue oxycodone 5 mg 1-2 tabs by mouth every 3 hours when necessary and Ultram 50 mg 1-2 tabs by mouth every 6 hours when necessary for pain Hypertension - well controlled; continue Bystolic 10 mg 1 tab by mouth every morning, Hyzaar 100/25 mg 1 tab by mouth daily, Norvasc 10 mg 1 tab by mouth every evening, Lasix 80 mg by mouth every other day and hydralazine 25 mg 1 tab by mouth twice a day Constipation - well controlled; continue Colace 100 mg by mouth twice a day Hyperlipidemia - continue Lipitor 20 mg by mouth every evening  Hypokalemia - well repleted; continue Kay Ciel 20 every every by mouth daily Anemia, acute blood loss - stable; hemoglobin 10.0   I have filled out patient's discharge paperwork and written prescriptions.  Patient will receive home health PT, OT and Nursing.  Total discharge time: Greater than 30 minutes  Discharge time involved coordination of the discharge process with social worker, nursing staff and therapy department. Medical justification for home health services verified.   CPT CODE: 64403    MEDINA-VARGAS,MONINA, [facility] [phone]

## 2014-12-21 ENCOUNTER — Ambulatory Visit: Payer: Medicare Other | Attending: Orthopedic Surgery | Admitting: Physical Therapy

## 2014-12-21 DIAGNOSIS — M25661 Stiffness of right knee, not elsewhere classified: Secondary | ICD-10-CM | POA: Diagnosis not present

## 2014-12-21 DIAGNOSIS — Z4789 Encounter for other orthopedic aftercare: Secondary | ICD-10-CM | POA: Insufficient documentation

## 2014-12-21 DIAGNOSIS — M25561 Pain in right knee: Secondary | ICD-10-CM | POA: Diagnosis not present

## 2014-12-26 ENCOUNTER — Ambulatory Visit: Payer: Medicare Other | Admitting: Physical Therapy

## 2014-12-26 DIAGNOSIS — Z4789 Encounter for other orthopedic aftercare: Secondary | ICD-10-CM | POA: Diagnosis not present

## 2014-12-29 ENCOUNTER — Encounter: Payer: Medicare Other | Admitting: *Deleted

## 2015-01-01 ENCOUNTER — Encounter: Payer: Medicare Other | Admitting: Physical Therapy

## 2015-01-04 ENCOUNTER — Encounter: Payer: Medicare Other | Admitting: Physical Therapy

## 2015-02-05 NOTE — Progress Notes (Signed)
Please put orders in Epic surgery 02-16-15 pre op 03-04-1thanks

## 2015-02-07 ENCOUNTER — Ambulatory Visit: Payer: Self-pay | Admitting: Orthopedic Surgery

## 2015-02-07 NOTE — Progress Notes (Signed)
Preoperative surgical orders have been place into the Epic hospital system for Beth [name] on 02/07/2015, 5:15 PM  by Mickel Crow for surgery on 02-16-2015.  Preop Knee orders including Experal, IV Tylenol, and IV Decadron as long as there are no contraindications to the above medications. [name], PA-C

## 2015-02-09 ENCOUNTER — Encounter (HOSPITAL_COMMUNITY)
Admission: RE | Admit: 2015-02-09 | Discharge: 2015-02-09 | Disposition: A | Discharge status: Home / Self Care | Payer: Medicare Other | Source: Ambulatory Visit | Attending: Orthopedic Surgery | Admitting: Orthopedic Surgery

## 2015-02-09 ENCOUNTER — Encounter (HOSPITAL_COMMUNITY): Payer: Self-pay

## 2015-02-09 DIAGNOSIS — Y838 Other surgical procedures as the cause of abnormal reaction of the patient, or of later complication, without mention of misadventure at the time of the procedure: Secondary | ICD-10-CM | POA: Diagnosis not present

## 2015-02-09 DIAGNOSIS — Z01812 Encounter for preprocedural laboratory examination: Secondary | ICD-10-CM | POA: Diagnosis not present

## 2015-02-09 DIAGNOSIS — T8453XA Infection and inflammatory reaction due to internal right knee prosthesis, initial encounter: Secondary | ICD-10-CM | POA: Diagnosis not present

## 2015-02-09 LAB — COMPREHENSIVE METABOLIC PANEL
ALBUMIN: 3.6 g/dL (ref 3.5–5.2)
ALT: 11 U/L (ref 0–35)
AST: 18 U/L (ref 0–37)
Alkaline Phosphatase: 119 U/L — ABNORMAL HIGH (ref 39–117)
Anion gap: 10 (ref 5–15)
BILIRUBIN TOTAL: 0.8 mg/dL (ref 0.3–1.2)
BUN: 25 mg/dL — ABNORMAL HIGH (ref 6–23)
CHLORIDE: 102 mmol/L (ref 96–112)
CO2: 29 mmol/L (ref 19–32)
Calcium: 9.6 mg/dL (ref 8.4–10.5)
Creatinine, Ser: 0.98 mg/dL (ref 0.50–1.10)
GFR calc Af Amer: 66 mL/min — ABNORMAL LOW (ref >90)
GFR calc non Af Amer: 57 mL/min — ABNORMAL LOW (ref >90)
GLUCOSE: 109 mg/dL — AB (ref 70–99)
Potassium: 4 mmol/L (ref 3.5–5.1)
SODIUM: 141 mmol/L (ref 135–145)
TOTAL PROTEIN: 8.1 g/dL (ref 6.0–8.3)

## 2015-02-09 LAB — URINALYSIS, ROUTINE W REFLEX MICROSCOPIC
BILIRUBIN URINE: NEGATIVE
Glucose, UA: NEGATIVE mg/dL
Hgb urine dipstick: NEGATIVE
Ketones, ur: NEGATIVE mg/dL
LEUKOCYTES UA: NEGATIVE
Nitrite: NEGATIVE
Protein, ur: NEGATIVE mg/dL
Specific Gravity, Urine: 1.017 (ref 1.005–1.030)
UROBILINOGEN UA: 0.2 mg/dL (ref 0.0–1.0)
pH: 7 (ref 5.0–8.0)

## 2015-02-09 LAB — PROTIME-INR
INR: 1.12 (ref 0.00–1.49)
Prothrombin Time: 14.5 seconds (ref 11.6–15.2)

## 2015-02-09 LAB — CBC
HCT: 37 % (ref 36.0–46.0)
HEMOGLOBIN: 11.3 g/dL — AB (ref 12.0–15.0)
MCH: 23.5 pg — AB (ref 26.0–34.0)
MCHC: 30.5 g/dL (ref 30.0–36.0)
MCV: 76.9 fL — ABNORMAL LOW (ref 78.0–100.0)
Platelets: 448 10*3/uL — ABNORMAL HIGH (ref 150–400)
RBC: 4.81 MIL/uL (ref 3.87–5.11)
RDW: 17.9 % — ABNORMAL HIGH (ref 11.5–15.5)
WBC: 9 10*3/uL (ref 4.0–10.5)

## 2015-02-09 LAB — APTT: aPTT: 33 seconds (ref 24–37)

## 2015-02-09 LAB — SURGICAL PCR SCREEN
MRSA, PCR: NEGATIVE
STAPHYLOCOCCUS AUREUS: NEGATIVE

## 2015-02-09 NOTE — Progress Notes (Addendum)
EKG per epic 05/31/2014 CXR epic 03/31/2014  LOV note Dr Percival Spanish 05/31/2014 epic

## 2015-02-09 NOTE — Progress Notes (Signed)
CMP results done 02/09/2015 faxed via EPIC to Dr Wynelle Link.

## 2015-02-09 NOTE — Patient Instructions (Signed)
20 NIYLAH HASSAN  02/09/2015   Your procedure is scheduled on:    Friday February 16, 2015   Report to [facility] Main Entrance and follow signs to  [place] arrive at 1330 PM.   Call this number if you have problems the morning of surgery [phone] or Presurgical Testing [phone].   Remember:  Do not eat  After Midnight but may take clear liquids till 9:30 am day of surgery then nothing by mouth.   For Living Will and/or Health Care Power Attorney Forms: please provide copy for your medical record, may bring AM of surgery (forms should be already notarized-we do not provide this service).   Take these medicines the morning of surgery with A SIP OF WATER: Oxycodone if needed;Nasal Spray if needed;Doxycycline                                You may not have any metal on your body including hair pins and piercings  Do not wear jewelry, make-up, lotions, powders, prefumes or deodorant.  Do not shave body hair  48 hours(2 days) of CHG soap use.                Do not bring valuables to the hospital. [place].  Contacts, dentures or bridgework may not be worn into surgery.  Leave suitcase in the car. After surgery it may be brought to your room.  For patients admitted to the hospital, checkout time is 11:00 AM the day of discharge.     Special Instructions: review fact sheets for MRSA information, Blood Transfusion fact sheet, Incentive Spirometry.  Remember: Type/Screen "Blue armsbands"- may not be removed once applied(would result in being retested AM of surgery, if removed). ________________________________________________________________________  [facility] - Preparing for Surgery Before surgery, you can play an important role.  Because skin is not sterile, your skin needs to be as free of germs as possible.  You can reduce the number of germs on your skin by washing with CHG (chlorahexidine gluconate) soap before surgery.  CHG is an  antiseptic cleaner which kills germs and bonds with the skin to continue killing germs even after washing. Please DO NOT use if you have an allergy to CHG or antibacterial soaps.  If your skin becomes reddened/irritated stop using the CHG and inform your nurse when you arrive at Short Stay. Do not shave (including legs and underarms) for at least 48 hours prior to the first CHG shower.  You may shave your face/neck. Please follow these instructions carefully:  1.  Shower with CHG Soap the night before surgery and the  morning of Surgery.  2.  If you choose to wash your hair, wash your hair first as usual with your  normal  shampoo.  3.  After you shampoo, rinse your hair and body thoroughly to remove the  shampoo.                           4.  Use CHG as you would any other liquid soap.  You can apply chg directly  to the skin and wash                       Gently with a scrungie or clean washcloth.  5.  Apply the CHG Soap to your body ONLY FROM  THE NECK DOWN.   Do not use on face/ open                           Wound or open sores. Avoid contact with eyes, ears mouth and genitals (private parts).                       Wash face,  Genitals (private parts) with your normal soap.             6.  Wash thoroughly, paying special attention to the area where your surgery  will be performed.  7.  Thoroughly rinse your body with warm water from the neck down.  8.  DO NOT shower/wash with your normal soap after using and rinsing off  the CHG Soap.                9.  Pat yourself dry with a clean towel.            10.  Wear clean pajamas.            11.  Place clean sheets on your bed the night of your first shower and do not  sleep with pets. Day of Surgery : Do not apply any lotions/deodorants the morning of surgery.  Please wear clean clothes to the hospital/surgery center.  FAILURE TO FOLLOW THESE INSTRUCTIONS MAY RESULT IN THE CANCELLATION OF YOUR SURGERY PATIENT  SIGNATURE_________________________________  NURSE SIGNATURE__________________________________  ________________________________________________________________________    CLEAR LIQUID DIET   Foods Allowed                                                                     Foods Excluded  Coffee and tea, regular and decaf                             liquids that you cannot  Plain Jell-O in any flavor                                             see through such as: Fruit ices (not with fruit pulp)                                     milk, soups, orange juice  Iced Popsicles                                    All solid food Carbonated beverages, regular and diet                                    Cranberry, grape and apple juices Sports drinks like Gatorade Lightly seasoned clear broth or consume(fat free) Sugar, honey syrup  Sample Menu Breakfast  Lunch                                     Supper Cranberry juice                    Beef broth                            Chicken broth Jell-O                                     Grape juice                           Apple juice Coffee or tea                        Jell-O                                      Popsicle                                                Coffee or tea                        Coffee or tea  _____________________________________________________________________    Incentive Spirometer  An incentive spirometer is a tool that can help keep your lungs clear and active. This tool measures how well you are filling your lungs with each breath. Taking long deep breaths may help reverse or decrease the chance of developing breathing (pulmonary) problems (especially infection) following:  A long period of time when you are unable to move or be active. BEFORE THE PROCEDURE   If the spirometer includes an indicator to show your best effort, your nurse or respiratory therapist will set it to a  desired goal.  If possible, sit up straight or lean slightly forward. Try not to slouch.  Hold the incentive spirometer in an upright position. INSTRUCTIONS FOR USE   Sit on the edge of your bed if possible, or sit up as far as you can in bed or on a chair.  Hold the incentive spirometer in an upright position.  Breathe out normally.  Place the mouthpiece in your mouth and seal your lips tightly around it.  Breathe in slowly and as deeply as possible, raising the piston or the ball toward the top of the column.  Hold your breath for 3-5 seconds or for as long as possible. Allow the piston or ball to fall to the bottom of the column.  Remove the mouthpiece from your mouth and breathe out normally.  Rest for a few seconds and repeat Steps 1 through 7 at least 10 times every 1-2 hours when you are awake. Take your time and take a few normal breaths between deep breaths.  The spirometer may include an indicator to show your best effort. Use the indicator as a goal to work toward during each repetition.  After each set of 10 deep breaths,  practice coughing to be sure your lungs are clear. If you have an incision (the cut made at the time of surgery), support your incision when coughing by placing a pillow or rolled up towels firmly against it. Once you are able to get out of bed, walk around indoors and cough well. You may stop using the incentive spirometer when instructed by your caregiver.  RISKS AND COMPLICATIONS  Take your time so you do not get dizzy or light-headed.  If you are in pain, you may need to take or ask for pain medication before doing incentive spirometry. It is harder to take a deep breath if you are having pain. AFTER USE  Rest and breathe slowly and easily.  It can be helpful to keep track of a log of your progress. Your caregiver can provide you with a simple table to help with this. If you are using the spirometer at home, follow these instructions: [place] IF:   You are having difficultly using the spirometer.  You have trouble using the spirometer as often as instructed.  Your pain medication is not giving enough relief while using the spirometer.  You develop fever of 100.5 F (38.1 C) or higher. SEEK IMMEDIATE MEDICAL CARE IF:   You cough up bloody sputum that had not been present before.  You develop fever of 102 F (38.9 C) or greater.  You develop worsening pain at or near the incision site. MAKE SURE YOU:   Understand these instructions.  Will watch your condition.  Will get help right away if you are not doing well or get worse. Document Released: 04/06/2007 Document Revised: 02/16/2012 Document Reviewed: 06/07/2007 Exit[employer] Patient Information 2014 Exit[employer], [place].   ________________________________________________________________________  WHAT IS A BLOOD TRANSFUSION? Blood Transfusion Information  A transfusion is the replacement of blood or some of its parts. Blood is made up of multiple cells which provide different functions.  Red blood cells carry oxygen and are used for blood loss replacement.  White blood cells fight against infection.  Platelets control bleeding.  Plasma helps clot blood.  Other blood products are available for specialized needs, such as hemophilia or other clotting disorders. BEFORE THE TRANSFUSION  Who gives blood for transfusions?   Healthy volunteers who are fully evaluated to make sure their blood is safe. This is blood bank blood. Transfusion therapy is the safest it has ever been in the practice of medicine. Before blood is taken from a donor, a complete history is taken to make sure that person has no history of diseases nor engages in risky social behavior (examples are intravenous drug use or sexual activity with multiple partners). The donor's travel history is screened to minimize risk of transmitting infections, such as malaria. The donated blood is tested for  signs of infectious diseases, such as HIV and hepatitis. The blood is then tested to be sure it is compatible with you in order to minimize the chance of a transfusion reaction. If you or a relative donates blood, this is often done in anticipation of surgery and is not appropriate for emergency situations. It takes many days to process the donated blood. RISKS AND COMPLICATIONS Although transfusion therapy is very safe and saves many lives, the main dangers of transfusion include:   Getting an infectious disease.  Developing a transfusion reaction. This is an allergic reaction to something in the blood you were given. Every precaution is taken to prevent this. The decision to have a blood transfusion has been  considered carefully by your caregiver before blood is given. Blood is not given unless the benefits outweigh the risks. AFTER THE TRANSFUSION  Right after receiving a blood transfusion, you will usually feel much better and more energetic. This is especially true if your red blood cells have gotten low (anemic). The transfusion raises the level of the red blood cells which carry oxygen, and this usually causes an energy increase.  The nurse administering the transfusion will monitor you carefully for complications. HOME CARE INSTRUCTIONS  No special instructions are needed after a transfusion. You may find your energy is better. Speak with your caregiver about any limitations on activity for underlying diseases you may have. SEEK MEDICAL CARE IF:   Your condition is not improving after your transfusion.  You develop redness or irritation at the intravenous (IV) site. SEEK IMMEDIATE MEDICAL CARE IF:  Any of the following symptoms occur over the next 12 hours:  Shaking chills.  You have a temperature by mouth above 102 F (38.9 C), not controlled by medicine.  Chest, back, or muscle pain.  People around you feel you are not acting correctly or are confused.  Shortness of breath  or difficulty breathing.  Dizziness and fainting.  You get a rash or develop hives.  You have a decrease in urine output.  Your urine turns a dark color or changes to pink, red, or brown. Any of the following symptoms occur over the next 10 days:  You have a temperature by mouth above 102 F (38.9 C), not controlled by medicine.  Shortness of breath.  Weakness after normal activity.  The white part of the eye turns yellow (jaundice).  You have a decrease in the amount of urine or are urinating less often.  Your urine turns a dark color or changes to pink, red, or brown. Document Released: 11/21/2000 Document Revised: 02/16/2012 Document Reviewed: 07/10/2008 [facility] Patient Information 2014 [place], [place].  _______________________________________________________________________

## 2015-02-12 ENCOUNTER — Other Ambulatory Visit: Payer: Self-pay | Admitting: General Practice

## 2015-02-12 NOTE — Telephone Encounter (Signed)
See if can make appointment in next 2 days to be seen for labs- has not been seen since April 2015

## 2015-02-12 NOTE — Progress Notes (Signed)
Fax received from Dr. Wynelle Link about CMET results with No Action required!

## 2015-02-12 NOTE — Telephone Encounter (Signed)
Only seen here once by Mae in 03/2014

## 2015-02-14 MED ORDER — OLMESARTAN MEDOXOMIL-HCTZ 40-12.5 MG PO TABS: 1.0000 | ORAL_TABLET | Freq: Every evening | ORAL | Status: DC

## 2015-02-14 MED ORDER — AMLODIPINE BESYLATE 10 MG PO TABS: 10.0000 mg | ORAL_TABLET | Freq: Every evening | ORAL | Status: DC

## 2015-02-14 MED ORDER — NEBIVOLOL HCL 10 MG PO TABS: 10.0000 mg | ORAL_TABLET | Freq: Every evening | ORAL | Status: DC

## 2015-02-14 MED ORDER — ATORVASTATIN CALCIUM 20 MG PO TABS: 20.0000 mg | ORAL_TABLET | Freq: Every evening | ORAL | Status: DC

## 2015-02-14 NOTE — Telephone Encounter (Signed)
RXs sent into pharmacy for 1 refill only Must be seen for further refills Pt notified

## 2015-02-15 ENCOUNTER — Ambulatory Visit (INDEPENDENT_AMBULATORY_CARE_PROVIDER_SITE_OTHER): Payer: Self-pay | Admitting: Orthopedic Surgery

## 2015-02-15 MED ORDER — TRANEXAMIC ACID 100 MG/ML IV SOLN: 2000.0000 mg | Freq: Once | INTRAVENOUS | Status: DC

## 2015-02-15 NOTE — H&P (Signed)
Beth [name]. [name] DOB: [birth year] Single / Language: Cleophus Molt / Race: White Female Date of Admission:  02/16/2015 CC:  Right Knee Pain History of Present Illness The patient is a 69 year old female who comes in for a preoperative History and Physical. The patient is scheduled for a right resection arthroplasty and placement of antibiotic spacer to be performed by Dr. Dione Plover. Ori Kreiter, MD at [facility] on 02-16-2015. The patient is a 69 year old female presenting several months postop following I&D of right total knee with poly exchange. Overall the patient feels that the right knee is not doing well. Post operative pain has been mild. The patient does report pain and continued drainage. The patient does indicate that these symptoms are worsening. Pain medications include: Oxycodone . The patient is currently with the assistance of: wheelchair. The patient is using an economy knee brace. She said that she does feel some warmth about the knee. She has not had any fever, chills, or systemic symptoms with it. She is getting frustrated with the status of the knee. We have discussed on numerous occasions in the past several months that I felt that the only way it was going to get better was a two stage revision. She has reached a decision to now proceed with the surgery. They continue to have progressive pain and severe functional limitations and dysfunction. They have failed non-operative management and recent surgical I&D with poly exchange. It is felt that they would benefit from undergoing resection of the current prosthesis and placement of an antibiotic spacer. Risks and benefits of the procedure have been discussed with the patient and they elect to proceed with surgery. There are no active contraindications to surgery such as rapidly progressive neurological disease.  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 father and child  Social History (Alexzandrew Monika Salk, III PA-C; 02/15/2015 4:08 PM) Drug/Alcohol Rehab (Previously) no Pain Contract no Tobacco use Never smoker. never smoker Children 5 or more Drug/Alcohol Rehab (Currently) no Marital status single Exercise Exercises daily; does other Current work status retired Living situation live alone Alcohol use never consumed alcohol Number of flights of stairs before winded less than 1 Illicit drug use no  Medication History (Alexzandrew L Perkins, III PA-C; 02/15/2015 4:09 PM) OxyCODONE HCl (5MG  Tablet, 1-2 Oral every 6-8 hours as needed, Taken starting 01/30/2015) Active. Doxycycline Monohydrate (100MG  Tablet, 1 (one) Oral two times daily, Taken starting 02/06/2015) Active. Benicar HCT (40-25MG  Tablet, Oral) Active. AmLODIPine Besylate (10MG  Tablet, Oral) Active. Atorvastatin Calcium (10MG  Tablet, Oral) Active. Bystolic (10MG  Tablet, Oral) Active.  Past Surgical History (Alexzandrew Monika Salk, III PA-C; 02/15/2015 4:08 PM) Colon Polyp Removal - Colonoscopy Hysterectomy complete (non-cancerous) Tubal Ligation Carpal Tunnel Repair right  Review of Systems (Alexzandrew L. Perkins III PA-C; 02/15/2015 4:10 PM) General Not Present- Chills, Fatigue, Fever, Memory Loss, Night Sweats, Weight Gain and Weight Loss. Skin Not Present- Eczema, Hives,  Itching, Lesions and Rash. HEENT Not Present- Dentures, Double Vision, Headache, Hearing Loss, Tinnitus and  Visual Loss. Respiratory Not Present- Allergies, Chronic Cough, Coughing up blood, Shortness of breath at rest and Shortness of breath with exertion. Cardiovascular Not Present- Chest Pain, Difficulty Breathing Lying Down, Murmur, Palpitations, Racing/skipping heartbeats and Swelling. Gastrointestinal Not Present- Abdominal Pain, Bloody Stool, Constipation, Diarrhea, Difficulty Swallowing, Heartburn, Jaundice, Loss of appetitie, Nausea and Vomiting. Female Genitourinary Present- Incontinence. Not Present- Blood in Urine, Discharge, Flank Pain, Painful Urination, Urgency, Urinary frequency, Urinary Retention, Urinating at Night and Weak urinary stream. Musculoskeletal Present- Joint Swelling. Not Present- Back Pain, Joint Pain, Morning Stiffness, Muscle Pain, Muscle Weakness and Spasms. Neurological Not Present- Blackout spells, Difficulty with balance, Dizziness, Paralysis, Tremor and Weakness. Psychiatric Not Present- Insomnia.  Vitals (Alexzandrew L. Perkins III PA-C; 02/15/2015 4:14 PM) 02/15/2015 4:14 PM Weight: 218 lb Height: 63in Weight was reported by patient. Height was reported by patient. Body Surface Area: 2.01 m Body Mass Index: 38.62 kg/m  BP: 118/74 (Sitting, Left Arm, Standard)  Physical Exam (Alexzandrew L. Perkins III PA-C; 02/15/2015 4:18 PM) General Mental Status -Alert, cooperative and good historian. General Appearance-pleasant, Not in acute distress. Orientation-Oriented X3. Build & Nutrition-Well nourished and Well developed.  Head and Neck Head-normocephalic, atraumatic . Neck Global Assessment - supple, no bruit auscultated on the right, no bruit auscultated on the left.  Eye Vision-Wears corrective lenses. Pupil - Bilateral-Regular and Round. Motion - Bilateral-EOMI.  Chest and Lung Exam Auscultation Breath sounds - clear at  anterior chest wall and clear at posterior chest wall. Adventitious sounds - No Adventitious sounds.  Cardiovascular Auscultation Rhythm - Regular rate and rhythm. Heart Sounds - S1 WNL and S2 WNL. Murmurs & Other Heart Sounds - Auscultation of the heart reveals - No Murmurs.  Abdomen Palpation/Percussion Tenderness - Abdomen is non-tender to palpation. Rigidity (guarding) - Abdomen is soft. Auscultation Auscultation of the abdomen reveals - Bowel sounds normal.  Female Genitourinary Note: Not done, not pertinent to present illness   Musculoskeletal Note: On exam, she is in no distress. Her right knee wound looks better, but she still has the drainage. There is no surrounding erythema.  Assessment & Plan (Alexzandrew L. Perkins III PA-C; 02/15/2015 4:45 PM) Status post total right knee replacement (Z96.651) Postoperative infection of knee, subsequent encounter (T81.4XXD) Note:Surgical Plans: Right Knee Resection and Placement of Antibiotic Spacer  Disposition: Home  PCP: Dr. Timmothy Sours [facility]  Topical TXA - History of Stroke  Anesthesia Issues: None  Signed electronically by Joelene Millin, III PA-C

## 2015-02-16 ENCOUNTER — Inpatient Hospital Stay (HOSPITAL_COMMUNITY): Payer: Medicare Other | Admitting: Anesthesiology

## 2015-02-16 ENCOUNTER — Encounter (HOSPITAL_COMMUNITY): Payer: Self-pay | Admitting: *Deleted

## 2015-02-16 ENCOUNTER — Inpatient Hospital Stay (HOSPITAL_COMMUNITY)
Admission: RE | Admit: 2015-02-16 | Discharge: 2015-02-19 | DRG: 465 | Disposition: A | Discharge status: Home Health | Payer: Medicare Other | Source: Ambulatory Visit | Attending: Orthopedic Surgery | Admitting: Orthopedic Surgery

## 2015-02-16 ENCOUNTER — Encounter (HOSPITAL_COMMUNITY)
Admission: RE | Disposition: A | Discharge status: Home Health | Payer: Self-pay | Source: Ambulatory Visit | Attending: Orthopedic Surgery

## 2015-02-16 DIAGNOSIS — M009 Pyogenic arthritis, unspecified: Secondary | ICD-10-CM | POA: Diagnosis present

## 2015-02-16 DIAGNOSIS — G4730 Sleep apnea, unspecified: Secondary | ICD-10-CM | POA: Diagnosis present

## 2015-02-16 DIAGNOSIS — E785 Hyperlipidemia, unspecified: Secondary | ICD-10-CM | POA: Diagnosis present

## 2015-02-16 DIAGNOSIS — Z8673 Personal history of transient ischemic attack (TIA), and cerebral infarction without residual deficits: Secondary | ICD-10-CM | POA: Diagnosis not present

## 2015-02-16 DIAGNOSIS — Z01812 Encounter for preprocedural laboratory examination: Secondary | ICD-10-CM | POA: Diagnosis not present

## 2015-02-16 DIAGNOSIS — T8453XA Infection and inflammatory reaction due to internal right knee prosthesis, initial encounter: Secondary | ICD-10-CM | POA: Diagnosis present

## 2015-02-16 DIAGNOSIS — I10 Essential (primary) hypertension: Secondary | ICD-10-CM | POA: Diagnosis present

## 2015-02-16 DIAGNOSIS — Y831 Surgical operation with implant of artificial internal device as the cause of abnormal reaction of the patient, or of later complication, without mention of misadventure at the time of the procedure: Secondary | ICD-10-CM | POA: Diagnosis present

## 2015-02-16 DIAGNOSIS — M179 Osteoarthritis of knee, unspecified: Secondary | ICD-10-CM | POA: Diagnosis present

## 2015-02-16 DIAGNOSIS — M1710 Unilateral primary osteoarthritis, unspecified knee: Secondary | ICD-10-CM | POA: Diagnosis present

## 2015-02-16 DIAGNOSIS — M25561 Pain in right knee: Secondary | ICD-10-CM | POA: Diagnosis present

## 2015-02-16 HISTORY — PX: EXCISIONAL TOTAL KNEE ARTHROPLASTY WITH ANTIBIOTIC SPACERS: SHX5827

## 2015-02-16 LAB — TYPE AND SCREEN
ABO/RH(D): O POS
Antibody Screen: NEGATIVE

## 2015-02-16 LAB — GRAM STAIN

## 2015-02-16 SURGERY — REMOVAL, TOTAL ARTHROPLASTY HARDWARE, KNEE, WITH ANTIBIOTIC SPACER INSERTION
Anesthesia: General | Site: Knee | Laterality: Right

## 2015-02-16 MED ORDER — PHENOL 1.4 % MT LIQD
1.0000 | OROMUCOSAL | Status: DC | PRN
Start: 2015-02-16 — End: 2015-02-19

## 2015-02-16 MED ORDER — CEFAZOLIN SODIUM 1-5 GM-% IV SOLN
1.0000 g | Freq: Three times a day (TID) | INTRAVENOUS | Status: DC
  Administered 2015-02-16 – 2015-02-17 (×2): 1 g via INTRAVENOUS
  Filled 2015-02-16 (×3): qty 50

## 2015-02-16 MED ORDER — BISACODYL 10 MG RE SUPP: 10.0000 mg | Freq: Every day | RECTAL | Status: DC | PRN

## 2015-02-16 MED ORDER — MIDAZOLAM HCL 2 MG/2ML IJ SOLN
INTRAMUSCULAR | Status: AC
  Filled 2015-02-16: qty 2

## 2015-02-16 MED ORDER — FENTANYL CITRATE 0.05 MG/ML IJ SOLN
INTRAMUSCULAR | Status: AC
  Filled 2015-02-16: qty 2

## 2015-02-16 MED ORDER — SODIUM CHLORIDE 0.9 % IV SOLN
INTRAVENOUS | Status: DC
  Administered 2015-02-16: 21:00:00 via INTRAVENOUS

## 2015-02-16 MED ORDER — LABETALOL HCL 5 MG/ML IV SOLN
5.0000 mg | INTRAVENOUS | Status: DC | PRN
  Administered 2015-02-16 (×2): 5 mg via INTRAVENOUS

## 2015-02-16 MED ORDER — HYDROMORPHONE HCL 1 MG/ML IJ SOLN
0.2500 mg | INTRAMUSCULAR | Status: DC | PRN
  Administered 2015-02-16 (×2): 0.5 mg via INTRAVENOUS

## 2015-02-16 MED ORDER — MENTHOL 3 MG MT LOZG: 1.0000 | LOZENGE | OROMUCOSAL | Status: DC | PRN

## 2015-02-16 MED ORDER — CEFAZOLIN SODIUM-DEXTROSE 2-3 GM-% IV SOLR
INTRAVENOUS | Status: AC
  Filled 2015-02-16: qty 50

## 2015-02-16 MED ORDER — HYDROCHLOROTHIAZIDE 12.5 MG PO CAPS
12.5000 mg | ORAL_CAPSULE | Freq: Every day | ORAL | Status: DC
  Filled 2015-02-16 (×4): qty 1

## 2015-02-16 MED ORDER — VANCOMYCIN HCL 1000 MG IV SOLR
INTRAVENOUS | Status: DC | PRN
  Administered 2015-02-16: 3 g

## 2015-02-16 MED ORDER — LIP MEDEX EX OINT
TOPICAL_OINTMENT | CUTANEOUS | Status: AC
  Filled 2015-02-16: qty 7

## 2015-02-16 MED ORDER — FUROSEMIDE 80 MG PO TABS
80.0000 mg | ORAL_TABLET | ORAL | Status: DC
  Administered 2015-02-17 – 2015-02-19 (×2): 80 mg via ORAL
  Filled 2015-02-16 (×2): qty 1

## 2015-02-16 MED ORDER — TRAMADOL HCL 50 MG PO TABS: 50.0000 mg | ORAL_TABLET | Freq: Four times a day (QID) | ORAL | Status: DC | PRN

## 2015-02-16 MED ORDER — FLEET ENEMA 7-19 GM/118ML RE ENEM: 1.0000 | ENEMA | Freq: Once | RECTAL | Status: AC | PRN

## 2015-02-16 MED ORDER — PROPOFOL 10 MG/ML IV BOLUS
INTRAVENOUS | Status: DC | PRN
  Administered 2015-02-16: 150 mg via INTRAVENOUS

## 2015-02-16 MED ORDER — IRBESARTAN 300 MG PO TABS
300.0000 mg | ORAL_TABLET | Freq: Every day | ORAL | Status: DC
  Administered 2015-02-17 – 2015-02-18 (×2): 300 mg via ORAL
  Filled 2015-02-16 (×4): qty 1

## 2015-02-16 MED ORDER — LACTATED RINGERS IV SOLN: INTRAVENOUS | Status: DC

## 2015-02-16 MED ORDER — AMLODIPINE BESYLATE 10 MG PO TABS
10.0000 mg | ORAL_TABLET | Freq: Every evening | ORAL | Status: DC
  Administered 2015-02-17: 10 mg via ORAL
  Filled 2015-02-16 (×4): qty 1

## 2015-02-16 MED ORDER — ATORVASTATIN CALCIUM 20 MG PO TABS
20.0000 mg | ORAL_TABLET | Freq: Every evening | ORAL | Status: DC
  Administered 2015-02-17 – 2015-02-18 (×2): 20 mg via ORAL
  Filled 2015-02-16 (×4): qty 1

## 2015-02-16 MED ORDER — ONDANSETRON HCL 4 MG/2ML IJ SOLN
INTRAMUSCULAR | Status: DC | PRN
  Administered 2015-02-16: 4 mg via INTRAVENOUS

## 2015-02-16 MED ORDER — HYDRALAZINE HCL 20 MG/ML IJ SOLN
INTRAMUSCULAR | Status: DC | PRN
  Administered 2015-02-16 (×2): 5 mg via INTRAVENOUS

## 2015-02-16 MED ORDER — ACETAMINOPHEN 325 MG PO TABS: 650.0000 mg | ORAL_TABLET | Freq: Four times a day (QID) | ORAL | Status: DC | PRN

## 2015-02-16 MED ORDER — DEXAMETHASONE SODIUM PHOSPHATE 10 MG/ML IJ SOLN
10.0000 mg | Freq: Once | INTRAMUSCULAR | Status: AC
  Administered 2015-02-17: 10 mg via INTRAVENOUS
  Filled 2015-02-16: qty 1

## 2015-02-16 MED ORDER — ROCURONIUM BROMIDE 100 MG/10ML IV SOLN
INTRAVENOUS | Status: AC
  Filled 2015-02-16: qty 1

## 2015-02-16 MED ORDER — LABETALOL HCL 5 MG/ML IV SOLN
INTRAVENOUS | Status: AC
  Filled 2015-02-16: qty 4

## 2015-02-16 MED ORDER — CHLORHEXIDINE GLUCONATE 4 % EX LIQD: 60.0000 mL | Freq: Once | CUTANEOUS | Status: DC

## 2015-02-16 MED ORDER — DOCUSATE SODIUM 100 MG PO CAPS
100.0000 mg | ORAL_CAPSULE | Freq: Two times a day (BID) | ORAL | Status: DC
  Administered 2015-02-17 – 2015-02-19 (×5): 100 mg via ORAL

## 2015-02-16 MED ORDER — VANCOMYCIN HCL 1000 MG IV SOLR
INTRAVENOUS | Status: AC
  Filled 2015-02-16: qty 3000

## 2015-02-16 MED ORDER — ONDANSETRON HCL 4 MG PO TABS: 4.0000 mg | ORAL_TABLET | Freq: Four times a day (QID) | ORAL | Status: DC | PRN

## 2015-02-16 MED ORDER — CYCLOBENZAPRINE HCL 10 MG PO TABS
10.0000 mg | ORAL_TABLET | Freq: Two times a day (BID) | ORAL | Status: DC
  Administered 2015-02-17 – 2015-02-19 (×5): 10 mg via ORAL
  Filled 2015-02-16 (×6): qty 1

## 2015-02-16 MED ORDER — POLYETHYLENE GLYCOL 3350 17 G PO PACK: 17.0000 g | PACK | Freq: Every day | ORAL | Status: DC | PRN

## 2015-02-16 MED ORDER — BUPIVACAINE HCL (PF) 0.25 % IJ SOLN
INTRAMUSCULAR | Status: DC | PRN
  Administered 2015-02-16: 20 mL

## 2015-02-16 MED ORDER — DEXAMETHASONE SODIUM PHOSPHATE 10 MG/ML IJ SOLN
10.0000 mg | Freq: Once | INTRAMUSCULAR | Status: AC
  Administered 2015-02-16: 10 mg via INTRAVENOUS

## 2015-02-16 MED ORDER — 0.9 % SODIUM CHLORIDE (POUR BTL) OPTIME
TOPICAL | Status: DC | PRN
  Administered 2015-02-16: 1000 mL

## 2015-02-16 MED ORDER — OLMESARTAN MEDOXOMIL-HCTZ 40-12.5 MG PO TABS: 1.0000 | ORAL_TABLET | Freq: Every evening | ORAL | Status: DC

## 2015-02-16 MED ORDER — SODIUM CHLORIDE 0.9 % IR SOLN
Status: DC | PRN
  Administered 2015-02-16: 4000 mL

## 2015-02-16 MED ORDER — BUPIVACAINE LIPOSOME 1.3 % IJ SUSP
20.0000 mL | Freq: Once | INTRAMUSCULAR | Status: DC
  Filled 2015-02-16: qty 20

## 2015-02-16 MED ORDER — LIDOCAINE HCL (CARDIAC) 20 MG/ML IV SOLN
INTRAVENOUS | Status: DC | PRN
  Administered 2015-02-16: 40 mg via INTRAVENOUS

## 2015-02-16 MED ORDER — HYDRALAZINE HCL 20 MG/ML IJ SOLN
INTRAMUSCULAR | Status: AC
  Filled 2015-02-16: qty 1

## 2015-02-16 MED ORDER — ONDANSETRON HCL 4 MG/2ML IJ SOLN: 4.0000 mg | Freq: Four times a day (QID) | INTRAMUSCULAR | Status: DC | PRN

## 2015-02-16 MED ORDER — HYDROMORPHONE HCL 1 MG/ML IJ SOLN
INTRAMUSCULAR | Status: AC
  Filled 2015-02-16: qty 1

## 2015-02-16 MED ORDER — OXYCODONE HCL 5 MG PO TABS
5.0000 mg | ORAL_TABLET | ORAL | Status: DC | PRN
  Administered 2015-02-16 – 2015-02-18 (×7): 10 mg via ORAL
  Administered 2015-02-18 (×2): 5 mg via ORAL
  Administered 2015-02-18 – 2015-02-19 (×3): 10 mg via ORAL
  Filled 2015-02-16 (×11): qty 2

## 2015-02-16 MED ORDER — DIPHENHYDRAMINE HCL 12.5 MG/5ML PO ELIX
12.5000 mg | ORAL_SOLUTION | ORAL | Status: DC | PRN
  Administered 2015-02-16: 25 mg via ORAL
  Filled 2015-02-16: qty 10

## 2015-02-16 MED ORDER — BUPIVACAINE HCL (PF) 0.25 % IJ SOLN
INTRAMUSCULAR | Status: AC
  Filled 2015-02-16: qty 30

## 2015-02-16 MED ORDER — MORPHINE SULFATE 2 MG/ML IJ SOLN
1.0000 mg | INTRAMUSCULAR | Status: DC | PRN
  Administered 2015-02-17: 2 mg via INTRAVENOUS
  Filled 2015-02-16: qty 1

## 2015-02-16 MED ORDER — LACTATED RINGERS IV SOLN
INTRAVENOUS | Status: DC
  Administered 2015-02-16: 1000 mL via INTRAVENOUS
  Administered 2015-02-16: 17:00:00 via INTRAVENOUS

## 2015-02-16 MED ORDER — SODIUM CHLORIDE 0.9 % IJ SOLN
INTRAMUSCULAR | Status: DC | PRN
  Administered 2015-02-16: 30 mL

## 2015-02-16 MED ORDER — FENTANYL CITRATE 0.05 MG/ML IJ SOLN
INTRAMUSCULAR | Status: DC | PRN
  Administered 2015-02-16 (×4): 50 ug via INTRAVENOUS

## 2015-02-16 MED ORDER — ACETAMINOPHEN 650 MG RE SUPP: 650.0000 mg | Freq: Four times a day (QID) | RECTAL | Status: DC | PRN

## 2015-02-16 MED ORDER — METHOCARBAMOL 500 MG PO TABS
500.0000 mg | ORAL_TABLET | Freq: Four times a day (QID) | ORAL | Status: DC | PRN
  Administered 2015-02-17 – 2015-02-19 (×6): 500 mg via ORAL
  Filled 2015-02-16 (×6): qty 1

## 2015-02-16 MED ORDER — METOCLOPRAMIDE HCL 5 MG/ML IJ SOLN
5.0000 mg | Freq: Three times a day (TID) | INTRAMUSCULAR | Status: DC | PRN
  Administered 2015-02-16: 10 mg via INTRAVENOUS
  Filled 2015-02-16: qty 2

## 2015-02-16 MED ORDER — ONDANSETRON HCL 4 MG/2ML IJ SOLN
INTRAMUSCULAR | Status: AC
  Filled 2015-02-16: qty 2

## 2015-02-16 MED ORDER — METOCLOPRAMIDE HCL 10 MG PO TABS: 5.0000 mg | ORAL_TABLET | Freq: Three times a day (TID) | ORAL | Status: DC | PRN

## 2015-02-16 MED ORDER — PROPOFOL 10 MG/ML IV BOLUS
INTRAVENOUS | Status: AC
  Filled 2015-02-16: qty 20

## 2015-02-16 MED ORDER — NEBIVOLOL HCL 10 MG PO TABS
10.0000 mg | ORAL_TABLET | Freq: Every evening | ORAL | Status: DC
  Administered 2015-02-17 – 2015-02-18 (×2): 10 mg via ORAL
  Filled 2015-02-16 (×4): qty 1

## 2015-02-16 MED ORDER — SODIUM CHLORIDE 0.9 % IJ SOLN
INTRAMUSCULAR | Status: AC
  Filled 2015-02-16: qty 50

## 2015-02-16 MED ORDER — NEBIVOLOL HCL 10 MG PO TABS
10.0000 mg | ORAL_TABLET | Freq: Every day | ORAL | Status: AC
  Administered 2015-02-16: 10 mg via ORAL
  Filled 2015-02-16: qty 1

## 2015-02-16 MED ORDER — ACETAMINOPHEN 500 MG PO TABS
1000.0000 mg | ORAL_TABLET | Freq: Four times a day (QID) | ORAL | Status: DC
  Administered 2015-02-17 (×3): 1000 mg via ORAL
  Filled 2015-02-16 (×4): qty 2

## 2015-02-16 MED ORDER — METHOCARBAMOL 1000 MG/10ML IJ SOLN
500.0000 mg | Freq: Four times a day (QID) | INTRAVENOUS | Status: DC | PRN
  Administered 2015-02-16: 500 mg via INTRAVENOUS
  Filled 2015-02-16 (×2): qty 5

## 2015-02-16 MED ORDER — ACETAMINOPHEN 10 MG/ML IV SOLN
1000.0000 mg | Freq: Once | INTRAVENOUS | Status: AC
  Administered 2015-02-16: 1000 mg via INTRAVENOUS
  Filled 2015-02-16: qty 100

## 2015-02-16 MED ORDER — SODIUM CHLORIDE 0.9 % IV SOLN: INTRAVENOUS | Status: DC

## 2015-02-16 MED ORDER — HYDROMORPHONE HCL 1 MG/ML IJ SOLN
INTRAMUSCULAR | Status: DC | PRN
  Administered 2015-02-16 (×4): 0.5 mg via INTRAVENOUS

## 2015-02-16 MED ORDER — BUPIVACAINE LIPOSOME 1.3 % IJ SUSP
INTRAMUSCULAR | Status: DC | PRN
  Administered 2015-02-16: 20 mL

## 2015-02-16 MED ORDER — CEFAZOLIN SODIUM-DEXTROSE 2-3 GM-% IV SOLR
2.0000 g | INTRAVENOUS | Status: AC
  Administered 2015-02-16: 2 g via INTRAVENOUS

## 2015-02-16 MED ORDER — HYDROMORPHONE HCL 2 MG/ML IJ SOLN
INTRAMUSCULAR | Status: AC
  Filled 2015-02-16: qty 1

## 2015-02-16 MED ORDER — RIVAROXABAN 10 MG PO TABS
10.0000 mg | ORAL_TABLET | Freq: Every day | ORAL | Status: DC
  Administered 2015-02-17 – 2015-02-19 (×3): 10 mg via ORAL
  Filled 2015-02-16 (×5): qty 1

## 2015-02-16 MED ORDER — MIDAZOLAM HCL 5 MG/5ML IJ SOLN
INTRAMUSCULAR | Status: DC | PRN
  Administered 2015-02-16: 2 mg via INTRAVENOUS

## 2015-02-16 SURGICAL SUPPLY — 57 items
BAG SPEC THK2 15X12 ZIP CLS (MISCELLANEOUS) ×1
BAG ZIPLOCK 12X15 (MISCELLANEOUS) ×3 IMPLANT
BANDAGE ELASTIC 6 VELCRO ST LF (GAUZE/BANDAGES/DRESSINGS) ×3 IMPLANT
BANDAGE ESMARK 6X9 LF (GAUZE/BANDAGES/DRESSINGS) ×1 IMPLANT
BLADE SAG 18X100X1.27 (BLADE) ×3 IMPLANT
BLADE SAW SGTL 11.0X1.19X90.0M (BLADE) ×3 IMPLANT
BNDG CMPR 9X6 STRL LF SNTH (GAUZE/BANDAGES/DRESSINGS) ×1
BNDG ESMARK 6X9 LF (GAUZE/BANDAGES/DRESSINGS) ×3
BONE CEMENT GENTAMICIN (Cement) ×9 IMPLANT
CEMENT BONE GENTAMICIN 40 (Cement) ×3 IMPLANT
CUFF TOURN SGL QUICK 34 (TOURNIQUET CUFF) ×6
CUFF TRNQT CYL 34X4X40X1 (TOURNIQUET CUFF) ×2 IMPLANT
DRAPE EXTREMITY T 121X128X90 (DRAPE) ×3 IMPLANT
DRAPE POUCH INSTRU U-SHP 10X18 (DRAPES) ×3 IMPLANT
DRAPE U-SHAPE 47X51 STRL (DRAPES) ×3 IMPLANT
DRSG ADAPTIC 3X8 NADH LF (GAUZE/BANDAGES/DRESSINGS) ×3 IMPLANT
DRSG PAD ABDOMINAL 8X10 ST (GAUZE/BANDAGES/DRESSINGS) ×3 IMPLANT
DURAPREP 26ML APPLICATOR (WOUND CARE) ×3 IMPLANT
ELECT REM PT RETURN 9FT ADLT (ELECTROSURGICAL) ×3
ELECTRODE REM PT RTRN 9FT ADLT (ELECTROSURGICAL) ×1 IMPLANT
EVACUATOR 1/8 PVC DRAIN (DRAIN) ×3 IMPLANT
FACESHIELD WRAPAROUND (MASK) ×15 IMPLANT
FACESHIELD WRAPAROUND OR TEAM (MASK) ×5 IMPLANT
GAUZE SPONGE 4X4 12PLY STRL (GAUZE/BANDAGES/DRESSINGS) ×3 IMPLANT
GLOVE BIO SURGEON STRL SZ7.5 (GLOVE) ×3 IMPLANT
GLOVE BIO SURGEON STRL SZ8 (GLOVE) ×6 IMPLANT
GLOVE BIOGEL PI IND STRL 8 (GLOVE) ×1 IMPLANT
GLOVE BIOGEL PI INDICATOR 8 (GLOVE) ×2
GOWN STRL REUS W/TWL LRG LVL3 (GOWN DISPOSABLE) ×3 IMPLANT
GOWN STRL REUS W/TWL XL LVL3 (GOWN DISPOSABLE) ×3 IMPLANT
HANDPIECE INTERPULSE COAX TIP (DISPOSABLE) ×3
IMMOBILIZER KNEE 20 (SOFTGOODS) ×3
IMMOBILIZER KNEE 20 THIGH 36 (SOFTGOODS) IMPLANT
KIT BASIN OR (CUSTOM PROCEDURE TRAY) ×3 IMPLANT
MANIFOLD NEPTUNE II (INSTRUMENTS) ×3 IMPLANT
NS IRRIG 1000ML POUR BTL (IV SOLUTION) ×6 IMPLANT
PACK TOTAL JOINT (CUSTOM PROCEDURE TRAY) ×3 IMPLANT
PADDING CAST COTTON 6X4 STRL (CAST SUPPLIES) ×4 IMPLANT
POSITIONER SURGICAL ARM (MISCELLANEOUS) ×3 IMPLANT
SET HNDPC FAN SPRY TIP SCT (DISPOSABLE) ×1 IMPLANT
STAPLER VISISTAT 35W (STAPLE) ×5 IMPLANT
SUCTION FRAZIER TIP 10 FR DISP (SUCTIONS) ×3 IMPLANT
SUT PDS AB 1 CT1 27 (SUTURE) ×3 IMPLANT
SUT VIC AB 1 CT1 27 (SUTURE) ×9
SUT VIC AB 1 CT1 27XBRD ANTBC (SUTURE) ×3 IMPLANT
SUT VIC AB 2-0 CT1 27 (SUTURE) ×9
SUT VIC AB 2-0 CT1 TAPERPNT 27 (SUTURE) ×3 IMPLANT
SUT VLOC 180 0 9IN  GS21 (SUTURE) ×2
SUT VLOC 180 0 9IN GS21 (SUTURE) IMPLANT
SWAB COLLECTION DEVICE MRSA (MISCELLANEOUS) ×3 IMPLANT
TIP HIGH FLOW IRRIGATION COAX (MISCELLANEOUS) ×2 IMPLANT
TOWEL OR 17X26 10 PK STRL BLUE (TOWEL DISPOSABLE) ×3 IMPLANT
TOWER CARTRIDGE SMART MIX (DISPOSABLE) ×3 IMPLANT
TRAY FOLEY CATH 14FRSI W/METER (CATHETERS) ×3 IMPLANT
TUBE ANAEROBIC SPECIMEN COL (MISCELLANEOUS) ×3 IMPLANT
WATER STERILE IRR 1500ML POUR (IV SOLUTION) ×3 IMPLANT
WRAP KNEE MAXI GEL POST OP (GAUZE/BANDAGES/DRESSINGS) ×4 IMPLANT

## 2015-02-16 NOTE — Anesthesia Preprocedure Evaluation (Addendum)
Anesthesia Evaluation  Patient identified by MRN, date of birth, ID band Patient awake    Reviewed: Allergy & Precautions, H&P , NPO status , Patient's Chart, lab work & pertinent test results  History of Anesthesia Complications (+) PONV  Airway Mallampati: II  TM Distance: >3 FB Neck ROM: Full    Dental  (+) Poor Dentition, Dental Advisory Given, Chipped, Missing All teeth broken or missing:   Pulmonary sleep apnea ,  breath sounds clear to auscultation  Pulmonary exam normal       Cardiovascular hypertension, Pt. on medications and Pt. 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 CVA, No Residual Symptoms negative psych ROS   GI/Hepatic negative GI ROS, Neg liver ROS,   Endo/Other  negative endocrine ROS  Renal/GU negative Renal ROS     Musculoskeletal  (+) Arthritis -,   Abdominal Normal abdominal exam  (+)   Peds  Hematology negative hematology ROS (+) anemia ,   Anesthesia Other Findings   Reproductive/Obstetrics negative OB ROS                            Anesthesia Physical Anesthesia Plan  ASA: III  Anesthesia Plan: General   Post-op Pain Management:    Induction: Intravenous  Airway Management Planned: Oral ETT  Additional Equipment:   Intra-op Plan:   Post-operative Plan: Extubation in OR  Informed Consent:   Plan Discussed with: Surgeon  Anesthesia Plan Comments:         Anesthesia Quick Evaluation

## 2015-02-16 NOTE — Anesthesia Postprocedure Evaluation (Signed)
  Anesthesia Post-op Note  Patient: Beth [name]  Procedure(s) Performed: Procedure(s) (LRB): RIGHT KNEE RESECTION ARTHROPLASTY WITH ANTIBIOTIC SPACERS (Right)  Patient Location: PACU  Anesthesia Type: General  Level of Consciousness: awake and alert   Airway and Oxygen Therapy: Patient Spontanous Breathing  Post-op Pain: mild  Post-op Assessment: Post-op Vital signs reviewed, Patient's Cardiovascular Status Stable, Respiratory Function Stable, Patent Airway and No signs of Nausea or vomiting  Last Vitals:  Filed Vitals:   02/16/15 1825  BP: 174/84  Pulse: 81  Temp:   Resp: 13    Post-op Vital Signs: stable   Complications: No apparent anesthesia complications

## 2015-02-16 NOTE — Interval H&P Note (Signed)
History and Physical Interval Note:  02/16/2015 3:14 PM  Beth [name]  has presented today for surgery, with the diagnosis of INFECTED RIGHT TOTAL KNEE ARTHROPLASTY   The various methods of treatment have been discussed with the patient and family. After consideration of risks, benefits and other options for treatment, the patient has consented to  Procedure(s): RIGHT KNEE RESECTION ARTHROPLASTY WITH ANTIBIOTIC SPACERS (Right) as a surgical intervention .  The patient's history has been reviewed, patient examined, no change in status, stable for surgery.  I have reviewed the patient's chart and labs.  Questions were answered to the patient's satisfaction.     Gearlean Alf

## 2015-02-16 NOTE — H&P (View-Only) (Signed)
Beth [name]. [name] DOB: 04/06/1946 Single / Language: Cleophus Molt / Race: White Female Date of Admission:  02/16/2015 CC:  Right Knee Pain History of Present Illness The patient is a 69 year old female who comes in for a preoperative History and Physical. The patient is scheduled for a right resection arthroplasty and placement of antibiotic spacer to be performed by Dr. Dione Plover. Semaj Kham, MD at [facility] on 02-16-2015. The patient is a 69 year old female presenting several months postop following I&D of right total knee with poly exchange. Overall the patient feels that the right knee is not doing well. Post operative pain has been mild. The patient does report pain and continued drainage. The patient does indicate that these symptoms are worsening. Pain medications include: Oxycodone . The patient is currently with the assistance of: wheelchair. The patient is using an economy knee brace. She said that she does feel some warmth about the knee. She has not had any fever, chills, or systemic symptoms with it. She is getting frustrated with the status of the knee. We have discussed on numerous occasions in the past several months that I felt that the only way it was going to get better was a two stage revision. She has reached a decision to now proceed with the surgery. They continue to have progressive pain and severe functional limitations and dysfunction. They have failed non-operative management and recent surgical I&D with poly exchange. It is felt that they would benefit from undergoing resection of the current prosthesis and placement of an antibiotic spacer. Risks and benefits of the procedure have been discussed with the patient and they elect to proceed with surgery. There are no active contraindications to surgery such as rapidly progressive neurological disease.  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 father and child  Social History (Alexzandrew Monika Salk, III PA-C; 02/15/2015 4:08 PM) Drug/Alcohol Rehab (Previously) no Pain Contract no Tobacco use Never smoker. never smoker Children 5 or more Drug/Alcohol Rehab (Currently) no Marital status single Exercise Exercises daily; does other Current work status retired Living situation live alone Alcohol use never consumed alcohol Number of flights of stairs before winded less than 1 Illicit drug use no  Medication History (Alexzandrew L Perkins, III PA-C; 02/15/2015 4:09 PM) OxyCODONE HCl (5MG  Tablet, 1-2 Oral every 6-8 hours as needed, Taken starting 01/30/2015) Active. Doxycycline Monohydrate (100MG  Tablet, 1 (one) Oral two times daily, Taken starting 02/06/2015) Active. Benicar HCT (40-25MG  Tablet, Oral) Active. AmLODIPine Besylate (10MG  Tablet, Oral) Active. Atorvastatin Calcium (10MG  Tablet, Oral) Active. Bystolic (10MG  Tablet, Oral) Active.  Past Surgical History (Alexzandrew Monika Salk, III PA-C; 02/15/2015 4:08 PM) Colon Polyp Removal - Colonoscopy Hysterectomy complete (non-cancerous) Tubal Ligation Carpal Tunnel Repair right  Review of Systems (Alexzandrew L. Perkins III PA-C; 02/15/2015 4:10 PM) General Not Present- Chills, Fatigue, Fever, Memory Loss, Night Sweats, Weight Gain and Weight Loss. Skin Not Present- Eczema, Hives,  Itching, Lesions and Rash. HEENT Not Present- Dentures, Double Vision, Headache, Hearing Loss, Tinnitus and  Visual Loss. Respiratory Not Present- Allergies, Chronic Cough, Coughing up blood, Shortness of breath at rest and Shortness of breath with exertion. Cardiovascular Not Present- Chest Pain, Difficulty Breathing Lying Down, Murmur, Palpitations, Racing/skipping heartbeats and Swelling. Gastrointestinal Not Present- Abdominal Pain, Bloody Stool, Constipation, Diarrhea, Difficulty Swallowing, Heartburn, Jaundice, Loss of appetitie, Nausea and Vomiting. Female Genitourinary Present- Incontinence. Not Present- Blood in Urine, Discharge, Flank Pain, Painful Urination, Urgency, Urinary frequency, Urinary Retention, Urinating at Night and Weak urinary stream. Musculoskeletal Present- Joint Swelling. Not Present- Back Pain, Joint Pain, Morning Stiffness, Muscle Pain, Muscle Weakness and Spasms. Neurological Not Present- Blackout spells, Difficulty with balance, Dizziness, Paralysis, Tremor and Weakness. Psychiatric Not Present- Insomnia.  Vitals (Alexzandrew L. Perkins III PA-C; 02/15/2015 4:14 PM) 02/15/2015 4:14 PM Weight: 218 lb Height: 63in Weight was reported by patient. Height was reported by patient. Body Surface Area: 2.01 m Body Mass Index: 38.62 kg/m  BP: 118/74 (Sitting, Left Arm, Standard)  Physical Exam (Alexzandrew L. Perkins III PA-C; 02/15/2015 4:18 PM) General Mental Status -Alert, cooperative and good historian. General Appearance-pleasant, Not in acute distress. Orientation-Oriented X3. Build & Nutrition-Well nourished and Well developed.  Head and Neck Head-normocephalic, atraumatic . Neck Global Assessment - supple, no bruit auscultated on the right, no bruit auscultated on the left.  Eye Vision-Wears corrective lenses. Pupil - Bilateral-Regular and Round. Motion - Bilateral-EOMI.  Chest and Lung Exam Auscultation Breath sounds - clear at  anterior chest wall and clear at posterior chest wall. Adventitious sounds - No Adventitious sounds.  Cardiovascular Auscultation Rhythm - Regular rate and rhythm. Heart Sounds - S1 WNL and S2 WNL. Murmurs & Other Heart Sounds - Auscultation of the heart reveals - No Murmurs.  Abdomen Palpation/Percussion Tenderness - Abdomen is non-tender to palpation. Rigidity (guarding) - Abdomen is soft. Auscultation Auscultation of the abdomen reveals - Bowel sounds normal.  Female Genitourinary Note: Not done, not pertinent to present illness   Musculoskeletal Note: On exam, she is in no distress. Her right knee wound looks better, but she still has the drainage. There is no surrounding erythema.  Assessment & Plan (Alexzandrew L. Perkins III PA-C; 02/15/2015 4:45 PM) Status post total right knee replacement (Z96.651) Postoperative infection of knee, subsequent encounter (T81.4XXD) Note:Surgical Plans: Right Knee Resection and Placement of Antibiotic Spacer  Disposition: Home  PCP: Dr. Timmothy Sours [facility]  Topical TXA - History of Stroke  Anesthesia Issues: None  Signed electronically by Joelene Millin, III PA-C

## 2015-02-16 NOTE — Transfer of Care (Signed)
Immediate Anesthesia Transfer of Care Note  Patient: Beth [name]  Procedure(s) Performed: Procedure(s) (LRB): RIGHT KNEE RESECTION ARTHROPLASTY WITH ANTIBIOTIC SPACERS (Right)  Patient Location: PACU  Anesthesia Type: General  Level of Consciousness: sedated, patient cooperative and responds to stimulation  Airway & Oxygen Therapy: Patient Spontanous Breathing and Patient connected to face mask oxgen  Post-op Assessment: Report given to PACU RN and Post -op Vital signs reviewed and stable  Post vital signs: Reviewed and stable  Complications: No apparent anesthesia complications

## 2015-02-16 NOTE — Brief Op Note (Signed)
02/16/2015  5:42 PM  PATIENT:  Beth [name]  69 y.o. female  PRE-OPERATIVE DIAGNOSIS:  INFECTED RIGHT TOTAL KNEE ARTHROPLASTY   POST-OPERATIVE DIAGNOSIS:  INFECTED RIGHT TOTAL KNEE ARTHROPLASTY   PROCEDURE:  Procedure(s): RIGHT KNEE RESECTION ARTHROPLASTY WITH ANTIBIOTIC SPACERS (Right)  SURGEON:  Surgeon(s) and Role:    * [name], MD - Primary  PHYSICIAN ASSISTANT:   ASSISTANTS: [name], PA-C   ANESTHESIA:   general  EBL:  Total I/O In: 1000 [I.V.:1000] Out: 73 [Urine:100]  BLOOD ADMINISTERED:none  DRAINS: (Medium) Hemovact drain(s) in the right knee with  Suction Open   LOCAL MEDICATIONS USED:  OTHER Exparel  COUNTS:  YES  TOURNIQUET:   Total Tourniquet Time Documented: Thigh (Right) - 68 minutes Total: Thigh (Right) - 68 minutes   DICTATION: .Other Dictation: Dictation Number [id number]  PLAN OF CARE: Admit to inpatient   PATIENT DISPOSITION:  PACU - hemodynamically stable.

## 2015-02-17 LAB — CBC
HEMATOCRIT: 33.3 % — AB (ref 36.0–46.0)
HEMOGLOBIN: 10.3 g/dL — AB (ref 12.0–15.0)
MCH: 23.6 pg — AB (ref 26.0–34.0)
MCHC: 30.9 g/dL (ref 30.0–36.0)
MCV: 76.2 fL — AB (ref 78.0–100.0)
Platelets: 358 10*3/uL (ref 150–400)
RBC: 4.37 MIL/uL (ref 3.87–5.11)
RDW: 17.4 % — ABNORMAL HIGH (ref 11.5–15.5)
WBC: 8.3 10*3/uL (ref 4.0–10.5)

## 2015-02-17 LAB — BASIC METABOLIC PANEL
Anion gap: 8 (ref 5–15)
BUN: 26 mg/dL — AB (ref 6–23)
CHLORIDE: 99 mmol/L (ref 96–112)
CO2: 27 mmol/L (ref 19–32)
CREATININE: 0.93 mg/dL (ref 0.50–1.10)
Calcium: 8.6 mg/dL (ref 8.4–10.5)
GFR calc Af Amer: 70 mL/min — ABNORMAL LOW (ref >90)
GFR calc non Af Amer: 60 mL/min — ABNORMAL LOW (ref >90)
Glucose, Bld: 294 mg/dL — ABNORMAL HIGH (ref 70–99)
Potassium: 3.7 mmol/L (ref 3.5–5.1)
SODIUM: 134 mmol/L — AB (ref 135–145)

## 2015-02-17 MED ORDER — VANCOMYCIN HCL 10 G IV SOLR
1500.0000 mg | Freq: Once | INTRAVENOUS | Status: AC
  Administered 2015-02-17: 1500 mg via INTRAVENOUS
  Filled 2015-02-17: qty 1500

## 2015-02-17 MED ORDER — VANCOMYCIN HCL IN DEXTROSE 1-5 GM/200ML-% IV SOLN
1000.0000 mg | Freq: Two times a day (BID) | INTRAVENOUS | Status: DC
  Administered 2015-02-17 – 2015-02-18 (×3): 1000 mg via INTRAVENOUS
  Filled 2015-02-17 (×3): qty 200

## 2015-02-17 MED ORDER — SODIUM CHLORIDE 0.9 % IJ SOLN
10.0000 mL | Freq: Two times a day (BID) | INTRAMUSCULAR | Status: DC
  Administered 2015-02-18: 10 mL

## 2015-02-17 MED ORDER — SODIUM CHLORIDE 0.9 % IJ SOLN
10.0000 mL | INTRAMUSCULAR | Status: DC | PRN
  Administered 2015-02-18 – 2015-02-19 (×4): 10 mL
  Filled 2015-02-17 (×4): qty 40

## 2015-02-17 NOTE — Discharge Instructions (Addendum)
Dr. [name] Total Joint Specialist [facility] [address], [place],  [address] [phone]  KNEE POSTOPERATIVE DIRECTIONS    Knee Rehabilitation, Guidelines Following Surgery  Results after knee surgery are often greatly improved when you follow the exercise, range of motion and muscle strengthening exercises prescribed by your doctor. Safety measures are also important to protect the knee from further injury. Any time any of these exercises cause you to have increased pain or swelling in your knee joint, decrease the amount until you are comfortable again and slowly increase them. If you have problems or questions, call your caregiver or physical therapist for advice.   HOME CARE INSTRUCTIONS  Remove items at home which could result in a fall. This includes throw rugs or furniture in walking pathways.  Continue medications as instructed at time of discharge. You may have some home medications which will be placed on hold until you complete the course of blood thinner medication.  You may start showering once you are discharged home but do not submerge the incision under water. Just pat the incision dry and apply a dry gauze dressing on daily. Walk with walker as instructed.  You may resume a sexual relationship in one month or when given the OK by  your doctor.   Use walker as long as suggested by your caregivers.  Avoid periods of inactivity such as sitting longer than an hour when not asleep. This helps prevent blood clots.  You may put full weight on your legs and walk as much as is comfortable.  You may return to work once you are cleared by your doctor.  Do not drive a car for 6 weeks or until released by you surgeon.   Do not drive while taking narcotics.  Wear the elastic stockings for three weeks following surgery during the day but you may remove then at night. Make sure you keep all of your appointments after your operation with  all of your doctors and caregivers. You should call the office at the above phone number and make an appointment for approximately two weeks after the date of your surgery. Change the dressing daily and reapply a dry dressing each time. Please pick up a stool softener and laxative for home use as long as you are requiring pain medications.  ICE to the affected knee every three hours for 30 minutes at a time and then as needed for pain and swelling.  Continue to use ice on the knee for pain and swelling from surgery. You may notice swelling that will progress down to the foot and ankle.  This is normal after surgery.  Elevate the leg when you are not up walking on it.   It is important for you to complete the blood thinner medication as prescribed by your doctor.  Continue to use the breathing machine which will help keep your temperature down.  It is common for your temperature to cycle up and down following surgery, especially at night when you are not up moving around and exerting yourself.  The breathing machine keeps your lungs expanded and your temperature down.  Keep the PICC line site clean and dry. Do not submerge under water.   MAKE SURE YOU:  Understand these instructions.  Will watch your condition.  Will get help right away if you are not doing well or get worse.    Pick up stool softner and laxative for home use following surgery while on pain medications. Do not  submerge incision under water. Please use good hand washing techniques while changing dressing each day. May shower starting three days after surgery. Please use a clean towel to pat the incision dry following showers. Continue to use ice for pain and swelling after surgery. Do not use any lotions or creams on the incision until instructed by your surgeon.   Take Xarelto for two more weeks, then discontinue Xarelto. Once the patient has completed the blood thinner regimen, then take a 325 mg Aspirin daily for three  more weeks.  Postoperative Constipation Protocol  Constipation - defined medically as fewer than three stools per week and severe constipation as less than one stool per week.  One of the most common issues patients have following surgery is constipation.  Even if you have a regular bowel pattern at home, your normal regimen is likely to be disrupted due to multiple reasons following surgery.  Combination of anesthesia, postoperative narcotics, change in appetite and fluid intake all can affect your bowels.  In order to avoid complications following surgery, here are some recommendations in order to help you during your recovery period.  Colace (docusate) - Pick up an over-the-counter form of Colace or another stool softener and take twice a day as long as you are requiring postoperative pain medications.  Take with a full glass of water daily.  If you experience loose stools or diarrhea, hold the colace until you stool forms back up.  If your symptoms do not get better within 1 week or if they get worse, check with your doctor.  Dulcolax (bisacodyl) - Pick up over-the-counter and take as directed by the product packaging as needed to assist with the movement of your bowels.  Take with a full glass of water.  Use this product as needed if not relieved by Colace only.   MiraLax (polyethylene glycol) - Pick up over-the-counter to have on hand.  MiraLax is a solution that will increase the amount of water in your bowels to assist with bowel movements.  Take as directed and can mix with a glass of water, juice, soda, coffee, or tea.  Take if you go more than two days without a movement. Do not use MiraLax more than once per day. Call your doctor if you are still constipated or irregular after using this medication for 7 days in a row.  If you continue to have problems with postoperative constipation, please contact the office for further assistance and recommendations.  If you experience "the worst  abdominal pain ever" or develop nausea or vomiting, please contact the office immediatly for further recommendations for treatment.     Information on my medicine - XARELTO (Rivaroxaban)  This medication education was reviewed with me or my healthcare representative as part of my discharge preparation.  The pharmacist that spoke with me during my hospital stay was:  Minda Ditto, [facility]  Why was Xarelto prescribed for you? Xarelto was prescribed for you to reduce the risk of blood clots forming after orthopedic surgery. The medical term for these abnormal blood clots is venous thromboembolism (VTE).  What do you need to know about xarelto ? Take your Xarelto ONCE DAILY at the same time every day. You may take it either with or without food.  If you have difficulty swallowing the tablet whole, you may crush it and mix in applesauce just prior to taking your dose.  Take Xarelto exactly as prescribed by your doctor and DO NOT stop taking Xarelto without talking to  the doctor who prescribed the medication.  Stopping without other VTE prevention medication to take the place of Xarelto may increase your risk of developing a clot.  After discharge, you should have regular check-up appointments with your healthcare provider that is prescribing your Xarelto.    What do you do if you miss a dose? If you miss a dose, take it as soon as you remember on the same day then continue your regularly scheduled once daily regimen the next day. Do not take two doses of Xarelto on the same day.   Important Safety Information A possible side effect of Xarelto is bleeding. You should call your healthcare provider right away if you experience any of the following: ? Bleeding from an injury or your nose that does not stop. ? Unusual colored urine (red or dark brown) or unusual colored stools (red or black). ? Unusual bruising for unknown reasons. ? A serious fall or if you hit your head (even if there  is no bleeding).  Some medicines may interact with Xarelto and might increase your risk of bleeding while on Xarelto. To help avoid this, consult your healthcare provider or pharmacist prior to using any new prescription or non-prescription medications, including herbals, vitamins, non-steroidal anti-inflammatory drugs (NSAIDs) and supplements.  This website has more information on Xarelto: [email or web address]   PICC Home Guide    A peripherally inserted central catheter (PICC) is a long, thin, flexible tube that is inserted into a vein in the upper arm. It is a form of intravenous (IV) access. It is considered to be a "central" line because the tip of the PICC ends in a large vein in your chest. This large vein is called the superior vena cava (SVC). The PICC tip ends in the SVC because there is a lot of blood flow in the SVC. This allows medicines and IV fluids to be quickly distributed throughout the body. The PICC is inserted using a sterile technique by a specially trained nurse or physician. After the PICC is inserted, a chest X-ray exam is done to be sure it is in the correct place.  A PICC may be placed for different reasons, such as:  To give medicines and liquid nutrition that can only be given through a central line. Examples are:  Certain antibiotic treatments.  Chemotherapy.  Total parenteral nutrition (TPN). To take frequent blood samples.  To give IV fluids and blood products.  If there is difficulty placing a peripheral intravenous (PIV) catheter. If taken care of properly, a PICC can remain in place for several months. A PICC can also allow a person to go home from the hospital early. Medicine and PICC care can be managed at home by a family member or home health care team.  [place] A PICC?  Problems with a PICC can occasionally occur. These may include the following:  A blood clot (thrombus) forming in or at the tip of the PICC. This can cause the  PICC to become clogged. A clot-dissolving medicine called tissue plasminogen activator (tPA) can be given through the PICC to help break up the clot.  Inflammation of the vein (phlebitis) in which the PICC is placed. Signs of inflammation may include redness, pain at the insertion site, red streaks, or being able to feel a "cord" in the vein where the PICC is located.  Infection in the PICC or at the insertion site. Signs of infection may include fever, chills, redness, swelling, or pus  drainage from the PICC insertion site.  PICC movement (malposition). The PICC tip may move from its original position due to excessive physical activity, forceful coughing, sneezing, or vomiting.  A break or cut in the PICC. It is important to not use scissors near the PICC.  Nerve or tendon irritation or injury during PICC insertion. WHAT SHOULD I KEEP IN MIND ABOUT ACTIVITIES WHEN I HAVE A PICC?  You may bend your arm and move it freely. If your PICC is near or at the bend of your elbow, avoid activity with repeated motion at the elbow.  Rest at home for the remainder of the day following PICC line insertion.  Avoid lifting heavy objects as instructed by your health care provider.  Avoid using a crutch with the arm on the same side as your PICC. You may need to use a walker. WHAT SHOULD I KNOW ABOUT MY PICC DRESSING?  Keep your PICC bandage (dressing) clean and dry to prevent infection.  Ask your health care provider when you may shower. Ask your health care provider to teach you how to wrap the PICC when you do take a shower. Change the PICC dressing as instructed by your health care provider.  Change your PICC dressing if it becomes loose or wet. WHAT SHOULD I KNOW ABOUT PICC CARE?  Check the PICC insertion site daily for leakage, redness, swelling, or pain.  Do not take a bath, swim, or use hot tubs when you have a PICC. Cover PICC line with clear plastic wrap and tape to keep it dry while showering.  Flush  the PICC as directed by your health care provider. Let your health care provider know right away if the PICC is difficult to flush or does not flush. Do not use force to flush the PICC.  Do not use a syringe that is less than 10 mL to flush the PICC.  Never pull or tug on the PICC.  Avoid blood pressure checks on the arm with the PICC.  Keep your PICC identification card with you at all times.  Do not take the PICC out yourself. Only a trained clinical professional should remove the PICC. SEEK IMMEDIATE MEDICAL CARE IF:  Your PICC is accidentally pulled all the way out. If this happens, cover the insertion site with a bandage or gauze dressing. Do not throw the PICC away. Your health care provider will need to inspect it.  Your PICC was tugged or pulled and has partially come out. Do not push the PICC back in.  There is any type of drainage, redness, or swelling where the PICC enters the skin.  You cannot flush the PICC, it is difficult to flush, or the PICC leaks around the insertion site when it is flushed.  You hear a "flushing" sound when the PICC is flushed.  You have pain, discomfort, or numbness in your arm, shoulder, or jaw on the same side as the PICC.  You feel your heart "racing" or skipping beats.  You notice a hole or tear in the PICC.  You develop chills or a fever. MAKE SURE YOU:  Understand these instructions.  Will watch your condition.  Will get help right away if you are not doing well or get worse. Document Released: 05/31/2003 Document Revised: 04/10/2014 Document Reviewed: 08/01/2013  [facility] Patient Information 2015 [place], [place]. This information is not intended to replace advice given to you by your health care provider. Make sure you discuss any questions you have with your health  care provider.  

## 2015-02-17 NOTE — Evaluation (Signed)
Occupational Therapy Evaluation Patient Details Name: Beth [name] MRN: [id number] DOB: 11-Oct-1946 Today's Date: 02/17/2015    History of Present Illness 69 yo female s/p R knee  resection arthroplasty with placement of Abx spacer;   Clinical Impression   Pt practiced up to 3in1. Requires increased time and cues for safety. Will follow to progress ADL independence and educate family further on how to assist.     Follow Up Recommendations  Home health OT;Supervision/Assistance - 24 hour    Equipment Recommendations  None recommended by OT    Recommendations for Other Services       Precautions / Restrictions Precautions Precautions: Other (comment);Fall Precaution Comments: NO ROM R KNEE Required Braces or Orthoses: Knee Immobilizer - Right Knee Immobilizer - Right: On at all times Restrictions Weight Bearing Restrictions: No Other Position/Activity Restrictions: WBAT      Mobility Bed Mobility            Transfers Overall transfer level: Needs assistance Equipment used: Rolling walker (2 wheeled) Transfers: Sit to/from Stand Sit to Stand: Min assist         General transfer comment: 2 attemtps to stand from recliner. verbal cues for hand placement and LE management.    Balance                                            ADL Overall ADL's : Needs assistance/impaired Eating/Feeding: Independent;Sitting   Grooming: Wash/dry hands;Set up;Sitting   Upper Body Bathing: Set up;Sitting   Lower Body Bathing: Maximal assistance;Sit to/from stand   Upper Body Dressing : Set up;Sitting   Lower Body Dressing: Total assistance;Sit to/from stand   Toilet Transfer: Minimal assistance;Ambulation;BSC;RW   Toileting- Clothing Manipulation and Hygiene: Moderate assistance;Sit to/from stand         General ADL Comments: Pt requires increased time to transfer into bathroom to 3in1 and back to chair. Discussed options of AE versus family assist  and appropriate clothing to make it easier to manage with KI. Pt states she would like to practice with AE.      Vision     Perception     Praxis      Pertinent Vitals/Pain Pain Assessment: 0-10 Pain Score: 4  Pain Location: R knee Pain Descriptors / Indicators: Aching Pain Intervention(s): Repositioned;Ice applied     Hand Dominance     Extremity/Trunk Assessment Upper Extremity Assessment Upper Extremity Assessment: Overall WFL for tasks assessed          Communication Communication Communication: No difficulties   Cognition Arousal/Alertness: Awake/alert Behavior During Therapy: WFL for tasks assessed/performed Overall Cognitive Status: Within Functional Limits for tasks assessed                     General Comments       Exercises       Shoulder Instructions      Home Living Family/patient expects to be discharged to:: Private residence (to son's house then [place] after TKA) Living Arrangements: Children Available Help at Discharge: Family Type of Home: House Home Access: Stairs to enter Technical brewer of Steps: 4 Entrance Stairs-Rails: Right Home Layout: One level     Bathroom Shower/Tub: Occupational psychologist: Standard     Home Equipment: Wheelchair - [employer] - 4 wheels;Crutches;Bedside commode;Adaptive equipment   Additional Comments: Initially going to son's home  Prior Functioning/Environment Level of Independence: Independent with assistive device(s)        Comments: using cane prior to spacer    OT Diagnosis: Generalized weakness   OT Problem List: Decreased strength;Decreased knowledge of use of DME or AE   OT Treatment/Interventions: Self-care/ADL training;Patient/family education;Therapeutic activities;DME and/or AE instruction    OT Goals(Current goals can be found in the care plan section) Acute Rehab OT Goals Patient Stated Goal: be independent again OT Goal Formulation: With  patient/family Time For Goal Achievement: 02/24/15 Potential to Achieve Goals: Good  OT Frequency: Min 2X/week   Barriers to D/C:            Co-evaluation              End of Session Equipment Utilized During Treatment: Rolling walker;Right knee immobilizer  Activity Tolerance: Patient tolerated treatment well Patient left: in chair;with call bell/phone within reach;with family/visitor present   Time: 1330-1400 OT Time Calculation (min): 30 min Charges:  OT General Charges $OT Visit: 1 Procedure OT Evaluation $Initial OT Evaluation Tier I: 1 Procedure OT Treatments $Therapeutic Activity: 8-22 mins G-Codes:    Jules Schick  [phone] 02/17/2015, 2:53 PM

## 2015-02-17 NOTE — Progress Notes (Signed)
ANTIBIOTIC CONSULT NOTE - INITIAL  Pharmacy Consult for Vancomycin Indication: wound infection  Allergies  Allergen Reactions  . Aleve [Naproxen Sodium]     Heart races  . Clonidine Derivatives Other (See Comments)    Dizziness and weakness  . Shellfish Allergy Nausea And Vomiting   Patient Measurements: Height: 5\' 5"  (165.1 cm) Weight: 208 lb (94.348 kg) (copied forward) IBW/kg (Calculated) : 57  Vital Signs: Temp: 97.5 F (36.4 C) (03/12 0444) Temp Source: Oral (03/12 0144) BP: 138/82 mmHg (03/12 0444) Pulse Rate: 73 (03/12 0444) Intake/Output from previous day: 03/11 0701 - 03/12 0700 In: 2350 [I.V.:2250; IV Piggyback:100] Out: 1705 [Urine:1550; Drains:155] Intake/Output from this shift:    Labs:  Recent Labs  02/17/15 0500  WBC 8.3  HGB 10.3*  PLT 358  CREATININE 0.93   Estimated Creatinine Clearance: 63 mL/min (by C-G formula based on Cr of 0.93). No results for input(s): VANCOTROUGH, VANCOPEAK, VANCORANDOM, GENTTROUGH, GENTPEAK, GENTRANDOM, TOBRATROUGH, TOBRAPEAK, TOBRARND, AMIKACINPEAK, AMIKACINTROU, AMIKACIN in the last 72 hours.   Microbiology: Recent Results (from the past 720 hour(s))  Surgical pcr screen     Status: None   Collection Time: 02/09/15  1:02 PM  Result Value Ref Range Status   MRSA, PCR NEGATIVE NEGATIVE Final   Staphylococcus aureus NEGATIVE NEGATIVE Final    Comment:        The Xpert SA Assay (FDA approved for NASAL specimens in patients over 56 years of age), is one component of a comprehensive surveillance program.  Test performance has been validated by [facility] for patients greater than or equal to 33 year old. It is not intended to diagnose infection nor to guide or monitor treatment.   Anaerobic culture     Status: None (Preliminary result)   Collection Time: 02/16/15  3:15 PM  Result Value Ref Range Status   Specimen Description SYNOVIAL RIGHT KNEE  Final   Special Requests NONE  Final   Gram Stain   Final   ABUNDANT WBC PRESENT, PREDOMINANTLY PMN NO ORGANISMS SEEN Performed at [facility] Gram Stain Report Called to,Read Back By and Verified With: Gram Stain Report Called to,Read Back By and Verified With: SHEPHERD K @1744  ON 02/16/2015 BY MCCOY.N Performed at [employer] PENDING  Incomplete   Report Status PENDING  Incomplete  Gram stain     Status: None   Collection Time: 02/16/15  3:15 PM  Result Value Ref Range Status   Specimen Description SYNOVIAL RIGHT KNEE  Final   Special Requests NONE  Final   Gram Stain   Final    ABUNDANT WBC PRESENT, PREDOMINANTLY PMN NO ORGANISMS SEEN Gram Stain Report Called to,Read Back By and Verified With: SHEPHERD,K. RN @1744  ON 02/16/15 BY MCCOY,N.    Report Status 02/16/2015 FINAL  Final  Body fluid culture     Status: None (Preliminary result)   Collection Time: 02/16/15  3:15 PM  Result Value Ref Range Status   Specimen Description SYNOVIAL RIGHT KNEE  Final   Special Requests NONE  Final   Gram Stain   Final    ABUNDANT WBC PRESENT, PREDOMINANTLY PMN NO ORGANISMS SEEN Performed by [facility] Gram Stain Report Called to,Read Back By and Verified With: Gram Stain Report Called to,Read Back By and Verified With: SHEPHERD K @1744  ON 03.11.2016 BY MCCOY  N Performed at [place] Performed at [employer]   Final   Report Status PENDING  Incomplete   Medical History: Past Medical History  Diagnosis Date  . Hypertension   . CVA (cerebral infarction)     2006  . Hyperlipidemia   . Blood transfusion without reported diagnosis 2012    anemia;pt denies transfusion stated was only on iron tablet  . Heart murmur   . Nocturia     3-4 times per night  . Gout     left elbow  . Sleep apnea     no cpap  . Arthritis     Knee both knees  . Anemia   . 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 08-09-14 Right eye  . Complication of anesthesia 06-21-14    trouble waking  up after colonscopy;pt denies   . Family history of anesthesia complication     sister very slow to awaken after anesthesia;severe vomiting   . Stroke 2006    x 1   Medications:  Scheduled:  . acetaminophen  1,000 mg Oral 4 times per day  . amLODipine  10 mg Oral QPM  . atorvastatin  20 mg Oral QPM  .  ceFAZolin (ANCEF) IV  1 g Intravenous 3 times per day  . cyclobenzaprine  10 mg Oral BID  . dexamethasone  10 mg Intravenous Once  . docusate sodium  100 mg Oral BID  . furosemide  80 mg Oral QODAY  . irbesartan  300 mg Oral q1800   And  . hydrochlorothiazide  12.5 mg Oral q1800  . nebivolol  10 mg Oral QPM  . rivaroxaban  10 mg Oral Q breakfast  . vancomycin  1,500 mg Intravenous Once  . vancomycin  1,000 mg Intravenous Q12H   Anti-infectives    Start     Dose/Rate Route Frequency Ordered Stop   02/17/15 2200  vancomycin (VANCOCIN) IVPB 1000 mg/200 mL premix     1,000 mg 200 mL/hr over 60 Minutes Intravenous Every 12 hours 02/17/15 0854     02/17/15 1000  vancomycin (VANCOCIN) 1,500 mg in sodium chloride 0.9 % 500 mL IVPB     1,500 mg 250 mL/hr over 120 Minutes Intravenous  Once 02/17/15 0853     02/17/15 0600  ceFAZolin (ANCEF) IVPB 2 g/50 mL premix    Comments:  HOLD ANTIBIOTIC AT TIME OF SURGERY UNTIL NEW INTRAOPERATIVE CULTURES CAN BE OBTAINED.   2 g 100 mL/hr over 30 Minutes Intravenous On call to O.R. 02/16/15 1331 02/16/15 1551   02/16/15 2300  ceFAZolin (ANCEF) IVPB 1 g/50 mL premix     1 g 100 mL/hr over 30 Minutes Intravenous 3 times per day 02/16/15 2103     02/16/15 1656  vancomycin (VANCOCIN) powder  Status:  Discontinued       As needed 02/16/15 1656 02/16/15 1742     Assessment: 69 yoF admitted 3/11 for 2 stage R Knee resection arthroplasty, hx of wound infection R TKA 07/03/14, and I&D with poly exchange 10/02/14.   Antibiotic spacer placed 3/11, Ancef continued post-op  Vancomycin per pharmacy 3/12, will stop Ancef for now, await cultures  Goal of  Therapy:  Vancomycin trough level 15-20 mcg/ml  Plan:   Aiming for higher Vancomycin trough levels until cx results  Vancomycin 1500mg  x1, then 1gm q12  Follow cx, trough at steady state  Minda Ditto PharmD Pager [phone] 02/17/2015, 10:32 AM

## 2015-02-17 NOTE — Evaluation (Signed)
Physical Therapy Evaluation Patient Details Name: Beth [name] MRN: [id number] DOB: 24-Jun-1946 Today's Date: 02/17/2015   History of Present Illness  69 yo female s/p R knee  resection arthroplasty with placement of Abx spacer;  Clinical Impression  Pt will benefit form PT to address deficits below; plan is for home, family can provide 24hr assist if needed    Follow Up Recommendations Home health PT    Equipment Recommendations  None recommended by PT    Recommendations for Other Services       Precautions / Restrictions Precautions Precautions: Other (comment) Precaution Comments: NO ROM R KNEE Required Braces or Orthoses: Knee Immobilizer - Right Knee Immobilizer - Right: On at all times Restrictions Weight Bearing Restrictions: No Other Position/Activity Restrictions: WBAT      Mobility  Bed Mobility Overal bed mobility: Needs Assistance Bed Mobility: Supine to Sit     Supine to sit: Min assist     General bed mobility comments: assist with RLE, incr time  Transfers Overall transfer level: Needs assistance Equipment used: Rolling walker (2 wheeled) Transfers: Sit to/from Stand Sit to Stand: Min assist;From elevated surface         General transfer comment: cues for hand placement and wt shift  Ambulation/Gait Ambulation/Gait assistance: Min assist;Min guard Ambulation Distance (Feet): 60 Feet Assistive device: Rolling walker (2 wheeled) Gait Pattern/deviations: Step-to pattern;Antalgic;Wide base of support     General Gait Details: cues for sequence, use of UEs as needed to limit wt/pain RLE; incr time, 3 standing breaks d/t SOB  Stairs            Wheelchair Mobility    Modified Rankin (Stroke Patients Only)       Balance                                             Pertinent Vitals/Pain Pain Assessment: 0-10 Pain Score: 3  Pain Location: right knee Pain Descriptors / Indicators: Sore Pain Intervention(s):  Limited activity within patient's tolerance;Monitored during session;Premedicated before session;Repositioned;Ice applied    Home Living Family/patient expects to be discharged to:: Private residence (to son's house then [place] after TKA) Living Arrangements: Children Available Help at Discharge: Family Type of Home: House Home Access: Ramped entrance Entrance Stairs-Rails: Right Entrance Stairs-Number of Steps: [place]: One level [place]: Toilet riser;Wheelchair - [employer] - 4 wheels;Crutches Additional Comments: Initially going to son's home    Prior Function Level of Independence: Independent with assistive device(s)         Comments: using cane prior to spacer     Hand Dominance        Extremity/Trunk Assessment   Upper Extremity Assessment: Defer to OT evaluation           Lower Extremity Assessment: LLE deficits/detail   LLE Deficits / Details: grossly 3+.5, knee is "bad" per pt report     Communication   Communication: No difficulties  Cognition Arousal/Alertness: Awake/alert Behavior During Therapy: WFL for tasks assessed/performed Overall Cognitive Status: Within Functional Limits for tasks assessed                      General Comments      Exercises        Assessment/Plan    PT Assessment Patient needs continued PT services  PT Diagnosis Difficulty walking   PT Problem List  Decreased strength;Decreased balance;Decreased mobility;Decreased range of motion;Decreased activity tolerance;Decreased knowledge of use of DME  PT Treatment Interventions DME instruction;Gait training;Functional mobility training;Therapeutic activities;Patient/family education;Stair training;Therapeutic exercise   PT Goals (Current goals can be found in the Care Plan section) Acute Rehab PT Goals Patient Stated Goal: be independent again PT Goal Formulation: With patient Time For Goal Achievement: 02/21/15 Potential to Achieve Goals: Good     Frequency 7X/week   Barriers to discharge        Co-evaluation               End of Session Equipment Utilized During Treatment: Gait belt Activity Tolerance: Patient tolerated treatment well Patient left: in chair Nurse Communication: Mobility status         Time: 2841-3244 PT Time Calculation (min) (ACUTE ONLY): 29 min   Charges:   PT Evaluation $Initial PT Evaluation Tier I: 1 Procedure PT Treatments $Gait Training: 8-22 mins   PT G Codes:        Beth [name] March 01, 2015, 1:32 PM

## 2015-02-17 NOTE — Progress Notes (Signed)
Peripherally Inserted Central Catheter/Midline Placement  The IV Nurse has discussed with the patient and/or persons authorized to consent for the patient, the purpose of this procedure and the potential benefits and risks involved with this procedure.  The benefits include less needle sticks, lab draws from the catheter and patient may be discharged home with the catheter.  Risks include, but not limited to, infection, bleeding, blood clot (thrombus formation), and puncture of an artery; nerve damage and irregular heat beat.  Alternatives to this procedure were also discussed.  PICC/Midline Placement Documentation  PICC / Midline Single Lumen 16/24/46 PICC Right Basilic 41 cm 1 cm (Active)     PICC / Midline Single Lumen 95/07/22 PICC Right Basilic 40 cm 1 cm (Active)  Indication for Insertion or Continuance of Line Administration of hyperosmolar/irritating solutions (i.e. TPN, Vancomycin, etc.) 02/17/2015  4:22 PM  Exposed Catheter (cm) 1 cm 02/17/2015  4:22 PM  Site Assessment Clean;Dry;Intact 02/17/2015  4:22 PM  Line Status Flushed;Saline locked;Blood return noted 02/17/2015  4:22 PM  Dressing Change Due 02/24/15 02/17/2015  4:22 PM       Gordan Payment 02/17/2015, 4:25 PM

## 2015-02-17 NOTE — Progress Notes (Signed)
   Subjective: 1 Day Post-Op Procedure(s) (LRB): RIGHT KNEE RESECTION ARTHROPLASTY WITH ANTIBIOTIC SPACERS (Right) Patient reports pain as mild.   We will start therapy today.  Plan is to go Home after hospital stay.  Objective: Vital signs in last 24 hours: Temp:  [97.3 F (36.3 C)-98.1 F (36.7 C)] 97.5 F (36.4 C) (03/12 0444) Pulse Rate:  [68-83] 73 (03/12 0444) Resp:  [12-17] 16 (03/12 0444) BP: (138-187)/(74-103) 138/82 mmHg (03/12 0444) SpO2:  [97 %-100 %] 100 % (03/12 0444) Weight:  [208 lb (94.348 kg)] 208 lb (94.348 kg) (03/11 1327)  Intake/Output from previous day:  Intake/Output Summary (Last 24 hours) at 02/17/15 0754 Last data filed at 02/17/15 0550  Gross per 24 hour  Intake   2350 ml  Output   1705 ml  Net    645 ml    Intake/Output this shift:    Labs:  Recent Labs  02/17/15 0500  HGB 10.3*    Recent Labs  02/17/15 0500  WBC 8.3  RBC 4.37  HCT 33.3*  PLT 358    Recent Labs  02/17/15 0500  NA 134*  K 3.7  CL 99  CO2 27  BUN 26*  CREATININE 0.93  GLUCOSE 294*  CALCIUM 8.6   No results for input(s): LABPT, INR in the last 72 hours.  EXAM General - Patient is Alert, Appropriate and Oriented Extremity - Neurologically intact Neurovascular intact No cellulitis present Compartment soft Dressing - dressing C/D/I Motor Function - intact, moving foot and toes well on exam.  Hemovacs are sewn in and to remain in over weekend  Past Medical History  Diagnosis Date  . Hypertension   . CVA (cerebral infarction)     2006  . Hyperlipidemia   . Blood transfusion without reported diagnosis 2012    anemia;pt denies transfusion stated was only on iron tablet  . Heart murmur   . Nocturia     3-4 times per night  . Gout     left elbow  . Sleep apnea     no cpap  . Arthritis     Knee both knees  . Anemia   . 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 08-09-14 Right eye  . Complication of anesthesia 06-21-14    trouble waking up  after colonscopy;pt denies   . Family history of anesthesia complication     sister very slow to awaken after anesthesia;severe vomiting   . Stroke 2006    x 1    Assessment/Plan: 1 Day Post-Op Procedure(s) (LRB): RIGHT KNEE RESECTION ARTHROPLASTY WITH ANTIBIOTIC SPACERS (Right) Principal Problem:   Septic arthritis of knee, right Active Problems:   OA (osteoarthritis) of knee   Advance diet Up with therapy PICC line for IV antibiotics Knee Immobilizer at all times. No range of motion to right knee Vancomycin per pharmacy  DVT Prophylaxis - Xarelto Weight-Bearing as tolerated to right leg   Lacey Dotson V 02/17/2015, 7:54 AM

## 2015-02-17 NOTE — Op Note (Signed)
Beth [name], Beth [name]                  ACCOUNT NO.:  [id number]  MEDICAL RECORD NO.:  [id number]  LOCATION:  32                         FACILITY:  [facility]  PHYSICIAN:  [name], M.D.    DATE OF BIRTH:  Apr 25, 1946  DATE OF PROCEDURE:  02/16/2015 DATE OF DISCHARGE:                              OPERATIVE REPORT   PREOPERATIVE DIAGNOSIS:  Infected right total knee arthroplasty.  POSTOPERATIVE DIAGNOSIS:  Infected right total knee arthroplasty.  PROCEDURE:  Right knee resection arthroplasty with placement of antibiotic spacer.  SURGEON:  [name], M.D.  ASSISTANT:  Alexzandrew L. Perkins, PA-C.  ANESTHESIA:  General.  ESTIMATED BLOOD LOSS:  Minimal.  DRAINS:  Hemovac x1.  TOURNIQUET TIME:  68 minutes at 300 mmHg.  COMPLICATIONS:  None.  CONDITION:  Stable to recovery.  BRIEF CLINICAL NOTE:  Beth [name] is a 69 year old female with complex history in regard to her right knee.  She had a primary total knee arthroplasty done last year complicated by infection.  Had irrigation, debridement, and polyethylene exchange.  She has had evidence of persistent infection and has not wanted any further operations until recently.  We had discussed resection arthroplasty numerous occasions and she is at a stage now where she realized that the infection will not clear on its own and presents for resection arthroplasty, antibiotic spacer.  PROCEDURE IN DETAIL:  After successful administration of general anesthetic, a tourniquet was placed high on the right thigh and right lower extremity, prepped and draped in the usual sterile fashion. Extremities wrapped in Esmarch, tourniquet inflated to 300 mmHg.  A midline incision was made with a 10 blade through subcutaneous tissue to the extensor mechanism.  Fresh blade was used to make a medial parapatellar arthrotomy.  Evidence of fibrinous debris in the joint as well as some seropurulent fluid.  Sent for Gram stain, culture and sensitivity.   We did a thorough scar debridement and removed the abnormal synovial tissue both medial and lateral gutter, suprapatellar area, and I also elevated the soft tissue on the proximal medial tibia to the joint line with a knife and into the semimembranosus bursa with a Cobb elevator.  Any abnormal tissue was removed from that area also. She had a previous patella tendon rupture.  The patella was riding and was sitting in an elevated position.  Everything was intact though.  I removed any abnormal tissue in the infrapatellar region also.  I was then able to sublux the patella laterally and we removed the tibial polyethylene.  I cleared out the posterior aspect of the joint of any abnormal synovium.  An oscillating saw was then used to disrupt the interface between the tibial component and bone and the tibial components removed with minimal bone loss.  The cement was then removed from the tibia.  Similarly on the femoral side, we disrupted the interface with an osteotome.  The femoral component came off, but there was noted that there was a small crack in the medial femoral condyle at the junction of the condyle and the supracondylar area.  This was an incomplete fracture.  I fixed it with 2 threaded K-wires and it was stable.  Cement  was removed from the femur.  Femoral canal was thoroughly irrigated.  We then thoroughly irrigated the joint with 3 L of saline using pulsatile lavage.  We also removed the patellar component.  We then mixed 3 batches of gentamicin impregnated cement with 3 g of vancomycin powder.  Cement was mixed and once ready to mold was formulated into a static spacer into the extension space of the knee.  We further irrigated with saline.  I then injected the extensor mechanism and periosteum of the femur with a total of 20 mL of Exparel mixed with 30 mL of saline, then an additional 20 mL of 0.25% Marcaine. The extensor mechanism was then closed over Hemovac drain with a  running #1 V-Loc suture as well as interrupted #1 PDS sutures.  We did some further subcutaneous debridement at the area that was previously opened. I then placed a Hemovac drain in the subcu space.  Subcu was closed over the drain with interrupted 2-0 Vicryl.  Skin was then closed with staples.  The drains were sewn in with nylon suture.  Tourniquet was then released with total time of 68 minutes.  There was minimal bleeding encountered.  The incision was cleaned and dried and a bulky sterile dressing applied.  The drains were hooked to suction.  She was placed into a knee immobilizer, awakened, and transported to recovery in stable condition.  Note that, a surgical assistant was a medical necessity for this procedure to do it in a safe and expeditious manner.  Surgical assistant was necessary for retraction of vital ligaments and neurovascular structures and for proper positioning of the knee to safely remove the components.     [name], M.D.     FA/MEDQ  D:  02/16/2015  T:  02/17/2015  Job:  [id number]

## 2015-02-18 LAB — BASIC METABOLIC PANEL
Anion gap: 9 (ref 5–15)
BUN: 30 mg/dL — ABNORMAL HIGH (ref 6–23)
CHLORIDE: 100 mmol/L (ref 96–112)
CO2: 27 mmol/L (ref 19–32)
CREATININE: 1.24 mg/dL — AB (ref 0.50–1.10)
Calcium: 8.4 mg/dL (ref 8.4–10.5)
GFR calc Af Amer: 49 mL/min — ABNORMAL LOW (ref >90)
GFR calc non Af Amer: 43 mL/min — ABNORMAL LOW (ref >90)
Glucose, Bld: 240 mg/dL — ABNORMAL HIGH (ref 70–99)
POTASSIUM: 4 mmol/L (ref 3.5–5.1)
Sodium: 136 mmol/L (ref 135–145)

## 2015-02-18 LAB — CBC
HCT: 28.5 % — ABNORMAL LOW (ref 36.0–46.0)
Hemoglobin: 8.7 g/dL — ABNORMAL LOW (ref 12.0–15.0)
MCH: 23.5 pg — ABNORMAL LOW (ref 26.0–34.0)
MCHC: 30.5 g/dL (ref 30.0–36.0)
MCV: 76.8 fL — ABNORMAL LOW (ref 78.0–100.0)
PLATELETS: 358 10*3/uL (ref 150–400)
RBC: 3.71 MIL/uL — ABNORMAL LOW (ref 3.87–5.11)
RDW: 17.8 % — ABNORMAL HIGH (ref 11.5–15.5)
WBC: 12.9 10*3/uL — ABNORMAL HIGH (ref 4.0–10.5)

## 2015-02-18 LAB — VANCOMYCIN, TROUGH: Vancomycin Tr: 22.6 ug/mL — ABNORMAL HIGH (ref 10.0–20.0)

## 2015-02-18 NOTE — Progress Notes (Signed)
CARE MANAGEMENT NOTE 02/18/2015  Patient:  NAINIKA, NEWLUN   Account Number:  [id number]  Date Initiated:  02/18/2015  Documentation initiated by:  [facility]  Subjective/Objective Assessment:   RIGHT KNEE RESECTION ARTHROPLASTY WITH ANTIBIOTIC SPACERS     Action/Plan:   Anticipated DC Date:     Anticipated DC Plan:  [place]  CM consult      [facility] Choice  Resumption Of Svcs/PTA Provider   Choice offered to / List presented to:  C-1 Patient        [place] arranged  HH-1 RN  IV Antibiotics  HH-2 PT      [place]   Status of service:  Completed, signed off Medicare Important Message given?  YES (If response is "NO", the following Medicare IM given date fields will be blank) Date Medicare IM given:  02/18/2015 Medicare IM given by:  [facility] Date Additional Medicare IM given:   Additional Medicare IM given by:    Discharge Disposition:  [place]  Per UR Regulation:    If discussed at Long Length of Stay Meetings, dates discussed:    Comments:  02/18/2015 1100 NCM spoke to pt and states she is active with [place] for IV abx at home. States her son, Aimie Wagman assist her with IV abx at home. She has RW and 3n1 for home. Waiting for final recommendations for home.  Will need Rx for IV abx to resume care at home.  Jonnie Finner RN CCM Case Mgmt phone [phone]

## 2015-02-18 NOTE — Progress Notes (Signed)
Subjective: 2 Days Post-Op Procedure(s) (LRB): RIGHT KNEE RESECTION ARTHROPLASTY WITH ANTIBIOTIC SPACERS (Right) Patient reports pain as moderate to bilateral knees. Tolerating PO's well. Progressing with PT. Denies SOB, CP, of bilateral calf pain. No Fever/ chills. Objective: Vital signs in last 24 hours: Temp:  [97.6 F (36.4 C)-98.5 F (36.9 C)] 98.1 F (36.7 C) (03/13 0607) Pulse Rate:  [73-93] 73 (03/13 0607) Resp:  [16-18] 17 (03/13 0607) BP: (123-137)/(60-86) 123/60 mmHg (03/13 0607) SpO2:  [97 %-99 %] 97 % (03/13 0607)  Intake/Output from previous day: 03/12 0701 - 03/13 0700 In: 2857.7 [P.O.:960; I.V.:1197.7; IV Piggyback:700] Out: 1928 [Urine:1550; Drains:378] Intake/Output this shift: Total I/O In: 0  Out: 120 [Urine:100; Drains:20]   Recent Labs  02/17/15 0500 02/18/15 0400  HGB 10.3* 8.7*    Recent Labs  02/17/15 0500 02/18/15 0400  WBC 8.3 12.9*  RBC 4.37 3.71*  HCT 33.3* 28.5*  PLT 358 358    Recent Labs  02/17/15 0500 02/18/15 0400  NA 134* 136  K 3.7 4.0  CL 99 100  CO2 27 27  BUN 26* 30*  CREATININE 0.93 1.24*  GLUCOSE 294* 240*  CALCIUM 8.6 8.4   No results for input(s): LABPT, INR in the last 72 hours.  Well nourished. Alert and oriented x3. RRR, Lungs clear, BS x4. Abdomen soft and non tender. Right Calf soft and non tender. Right knee dressing C/D/I. No DVT signs. Compartment soft. No signs of infection.  Right LE neurovascular intact.  Assessment/Plan: 2 Days Post-Op Procedure(s) (LRB): RIGHT KNEE RESECTION ARTHROPLASTY WITH ANTIBIOTIC SPACERS (Right) Up with PT Dressing and Drain remain in place per Dr. Wynelle Link note and patient confirms this plan. Remain in knee immobilizer No ROM of Right knee WBAT PICC in place D/c when ready Continue current care  Beth [name], Beth [name] 02/18/2015, 10:10 AM

## 2015-02-18 NOTE — Progress Notes (Signed)
ANTIBIOTIC CONSULT NOTE  Pharmacy Consult for Vancomycin Indication: wound infection  Allergies  Allergen Reactions  . Aleve [Naproxen Sodium]     Heart races  . Clonidine Derivatives Other (See Comments)    Dizziness and weakness  . Shellfish Allergy Nausea And Vomiting   Patient Measurements: Height: 5\' 5"  (165.1 cm) Weight: 208 lb (94.348 kg) (copied forward) IBW/kg (Calculated) : 57  Vital Signs: Temp: 98.1 F (36.7 C) (03/13 0607) Temp Source: Oral (03/13 0607) BP: 123/60 mmHg (03/13 0607) Pulse Rate: 73 (03/13 0607) Intake/Output from previous day: 03/12 0701 - 03/13 0700 In: 2857.7 [P.O.:960; I.V.:1197.7; IV Piggyback:700] Out: 1928 [Urine:1550; Drains:378] Intake/Output from this shift: Total I/O In: 0  Out: 120 [Urine:100; Drains:20]  Labs:  Recent Labs  02/17/15 0500 02/18/15 0400  WBC 8.3 12.9*  HGB 10.3* 8.7*  PLT 358 358  CREATININE 0.93 1.24*   Estimated Creatinine Clearance: 47.2 mL/min (by C-G formula based on Cr of 1.24). No results for input(s): VANCOTROUGH, VANCOPEAK, VANCORANDOM, GENTTROUGH, GENTPEAK, GENTRANDOM, TOBRATROUGH, TOBRAPEAK, TOBRARND, AMIKACINPEAK, AMIKACINTROU, AMIKACIN in the last 72 hours.   Microbiology: Recent Results (from the past 720 hour(s))  Surgical pcr screen     Status: None   Collection Time: 02/09/15  1:02 PM  Result Value Ref Range Status   MRSA, PCR NEGATIVE NEGATIVE Final   Staphylococcus aureus NEGATIVE NEGATIVE Final    Comment:        The Xpert SA Assay (FDA approved for NASAL specimens in patients over 51 years of age), is one component of a comprehensive surveillance program.  Test performance has been validated by [facility] for patients greater than or equal to 42 year old. It is not intended to diagnose infection nor to guide or monitor treatment.   Anaerobic culture     Status: None (Preliminary result)   Collection Time: 02/16/15  3:15 PM  Result Value Ref Range Status   Specimen  Description SYNOVIAL RIGHT KNEE  Final   Special Requests NONE  Final   Gram Stain   Final    ABUNDANT WBC PRESENT, PREDOMINANTLY PMN NO ORGANISMS SEEN Performed at [facility] Gram Stain Report Called to,Read Back By and Verified With: Gram Stain Report Called to,Read Back By and Verified With: SHEPHERD K @1744  ON 02/16/2015 BY MCCOY.N Performed at [employer]   Final    NO ANAEROBES ISOLATED; CULTURE IN PROGRESS FOR 5 DAYS Performed at [facility]    Report Status PENDING  Incomplete  Gram stain     Status: None   Collection Time: 02/16/15  3:15 PM  Result Value Ref Range Status   Specimen Description SYNOVIAL RIGHT KNEE  Final   Special Requests NONE  Final   Gram Stain   Final    ABUNDANT WBC PRESENT, PREDOMINANTLY PMN NO ORGANISMS SEEN Gram Stain Report Called to,Read Back By and Verified With: SHEPHERD,K. RN @1744  ON 02/16/15 BY MCCOY,N.    Report Status 02/16/2015 FINAL  Final  Body fluid culture     Status: None (Preliminary result)   Collection Time: 02/16/15  3:15 PM  Result Value Ref Range Status   Specimen Description SYNOVIAL RIGHT KNEE  Final   Special Requests NONE  Final   Gram Stain   Final    ABUNDANT WBC PRESENT, PREDOMINANTLY PMN NO ORGANISMS SEEN Performed by [facility] Gram Stain Report Called to,Read Back By and Verified With: Gram Stain Report Called to,Read Back By and Verified With: [facility]  K @1744  ON 03.11.2016 BY MCCOY  N Performed at [facility]    Culture NO GROWTH Performed at [facility]   Final   Report Status PENDING  Incomplete   Medical History: Past Medical History  Diagnosis Date  . Hypertension   . CVA (cerebral infarction)     2006  . Hyperlipidemia   . Blood transfusion without reported diagnosis 2012    anemia;pt denies transfusion stated was only on iron tablet  . Heart murmur   . Nocturia     3-4 times per night  . Gout     left elbow  . Sleep apnea      no cpap  . Arthritis     Knee both knees  . Anemia   . 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 08-09-14 Right eye  . Complication of anesthesia 06-21-14    trouble waking up after colonscopy;pt denies   . Family history of anesthesia complication     sister very slow to awaken after anesthesia;severe vomiting   . Stroke 2006    x 1   Medications:  Scheduled:  . amLODipine  10 mg Oral QPM  . atorvastatin  20 mg Oral QPM  . cyclobenzaprine  10 mg Oral BID  . docusate sodium  100 mg Oral BID  . furosemide  80 mg Oral QODAY  . irbesartan  300 mg Oral q1800   And  . hydrochlorothiazide  12.5 mg Oral q1800  . nebivolol  10 mg Oral QPM  . rivaroxaban  10 mg Oral Q breakfast  . sodium chloride  10-40 mL Intracatheter Q12H  . vancomycin  1,000 mg Intravenous Q12H   Anti-infectives    Start     Dose/Rate Route Frequency Ordered Stop   02/17/15 2200  vancomycin (VANCOCIN) IVPB 1000 mg/200 mL premix     1,000 mg 200 mL/hr over 60 Minutes Intravenous Every 12 hours 02/17/15 0854     02/17/15 1000  vancomycin (VANCOCIN) 1,500 mg in sodium chloride 0.9 % 500 mL IVPB     1,500 mg 250 mL/hr over 120 Minutes Intravenous  Once 02/17/15 0853 02/17/15 1315   02/17/15 0600  ceFAZolin (ANCEF) IVPB 2 g/50 mL premix    Comments:  HOLD ANTIBIOTIC AT TIME OF SURGERY UNTIL NEW INTRAOPERATIVE CULTURES CAN BE OBTAINED.   2 g 100 mL/hr over 30 Minutes Intravenous On call to O.R. 02/16/15 1331 02/16/15 1551   02/16/15 2300  ceFAZolin (ANCEF) IVPB 1 g/50 mL premix  Status:  Discontinued     1 g 100 mL/hr over 30 Minutes Intravenous 3 times per day 02/16/15 2103 02/17/15 1026   02/16/15 1656  vancomycin (VANCOCIN) powder  Status:  Discontinued       As needed 02/16/15 1656 02/16/15 1742     Assessment: 69 yoF admitted 3/11 for 2 stage R Knee resection arthroplasty, hx of wound infection R TKA 07/03/14, and I&D with poly exchange 10/02/14. Antibiotic spacer placed 3/11, Ancef continued post-op.  Vancomycin per pharmacy 3/12, will stop Ancef for now, await cultures.  Synovial fluid cx with abundant WBC, no organisms on gram stain  Serum Cr increasing  Aiming for higher Vancomycin trough levels until cx results  Goal of Therapy:  Vancomycin trough level 15-20 mcg/ml  Plan:   Trough tonight prior to 3rd maintenance dose  Vancomycin 1500mg  x1, then 1gm q12  Follow cx, renal function  Minda Ditto PharmD Pager [phone] 02/18/2015, 11:26 AM

## 2015-02-18 NOTE — Progress Notes (Signed)
Physical Therapy Treatment Patient Details Name: Beth [name] MRN: [id number] DOB: 1946-03-23 Today's Date: 02/18/2015    History of Present Illness 69 yo female s/p R knee  resection arthroplasty with placement of Abx spacer;    PT Comments    Pt is progressing, motivated but somewhat limited by right ankle pain this am  Follow Up Recommendations  Home health PT;Supervision for mobility/OOB     Equipment Recommendations  None recommended by PT    Recommendations for Other Services       Precautions / Restrictions Precautions Precaution Comments: NO ROM R KNEE Required Braces or Orthoses: Knee Immobilizer - Right Knee Immobilizer - Right: On at all times Restrictions Weight Bearing Restrictions: No Other Position/Activity Restrictions: WBAT    Mobility  Bed Mobility Overal bed mobility: Needs Assistance Bed Mobility: Supine to Sit     Supine to sit: Min assist;HOB elevated     General bed mobility comments: assist with RLE, incr time  Transfers Overall transfer level: Needs assistance Equipment used: Rolling walker (2 wheeled) Transfers: Sit to/from Stand Sit to Stand: Min assist         General transfer comment: cues for hand placement and wt shift  Ambulation/Gait Ambulation/Gait assistance: Min guard Ambulation Distance (Feet): 50 Feet Assistive device: Rolling walker (2 wheeled) Gait Pattern/deviations: Step-to pattern;Trunk flexed;Wide base of support;Antalgic   Gait velocity interpretation: Below normal speed for age/gender General Gait Details: cues for sequence and step length; pt requiring incr time, 1 standing rest   Stairs            Wheelchair Mobility    Modified Rankin (Stroke Patients Only)       Balance Overall balance assessment: Needs assistance         Standing balance support: Bilateral upper extremity supported;Single extremity supported;During functional activity Standing balance-[name]: Poor Standing  balance comment: pt requires support of RW   for balance and  assist to pull up  lower body garments in standing,                     Cognition Arousal/Alertness: Awake/alert Behavior During Therapy: WFL for tasks assessed/performed Overall Cognitive Status: Within Functional Limits for tasks assessed                      Exercises      General Comments        Pertinent Vitals/Pain Pain Assessment: 0-10 Pain Score: 5  Pain Location: R ankle, lateral lower leg Pain Descriptors / Indicators: Constant Pain Intervention(s): Limited activity within patient's tolerance;Monitored during session;Repositioned;Ice applied;Premedicated before session    Home Living                      Prior Function            PT Goals (current goals can now be found in the care plan section) Acute Rehab PT Goals Patient Stated Goal: be independent again PT Goal Formulation: With patient Time For Goal Achievement: 02/21/15 Potential to Achieve Goals: Good Progress towards PT goals: Progressing toward goals    Frequency  7X/week    PT Plan Current plan remains appropriate    Co-evaluation             End of Session Equipment Utilized During Treatment: Right knee immobilizer;Gait belt Activity Tolerance: Patient limited by fatigue;Patient limited by pain Patient left: in chair;with call bell/phone within reach     Time: 1761-6073 PT  Time Calculation (min) (ACUTE ONLY): 34 min  Charges:  $Gait Training: 23-37 mins                    G Codes:      Beth [name] 02-19-15, 11:58 AM

## 2015-02-19 ENCOUNTER — Encounter (HOSPITAL_COMMUNITY): Payer: Self-pay | Admitting: Orthopedic Surgery

## 2015-02-19 LAB — CBC
HEMATOCRIT: 26.6 % — AB (ref 36.0–46.0)
HEMOGLOBIN: 8.1 g/dL — AB (ref 12.0–15.0)
MCH: 23.7 pg — AB (ref 26.0–34.0)
MCHC: 30.5 g/dL (ref 30.0–36.0)
MCV: 77.8 fL — ABNORMAL LOW (ref 78.0–100.0)
Platelets: 306 10*3/uL (ref 150–400)
RBC: 3.42 MIL/uL — AB (ref 3.87–5.11)
RDW: 18.2 % — ABNORMAL HIGH (ref 11.5–15.5)
WBC: 10.2 10*3/uL (ref 4.0–10.5)

## 2015-02-19 LAB — BASIC METABOLIC PANEL
Anion gap: 6 (ref 5–15)
BUN: 25 mg/dL — AB (ref 6–23)
CO2: 27 mmol/L (ref 19–32)
Calcium: 8.3 mg/dL — ABNORMAL LOW (ref 8.4–10.5)
Chloride: 105 mmol/L (ref 96–112)
Creatinine, Ser: 0.73 mg/dL (ref 0.50–1.10)
GFR calc Af Amer: >90 mL/min (ref >90)
GFR, EST NON AFRICAN AMERICAN: 84 mL/min — AB (ref >90)
Glucose, Bld: 146 mg/dL — ABNORMAL HIGH (ref 70–99)
POTASSIUM: 3.7 mmol/L (ref 3.5–5.1)
SODIUM: 138 mmol/L (ref 135–145)

## 2015-02-19 MED ORDER — RIVAROXABAN 10 MG PO TABS
10.0000 mg | ORAL_TABLET | Freq: Every day | ORAL | Status: DC
Start: 2015-02-19 — End: 2015-03-19

## 2015-02-19 MED ORDER — HEPARIN SOD (PORK) LOCK FLUSH 100 UNIT/ML IV SOLN
250.0000 [IU] | Freq: Every day | INTRAVENOUS | Status: DC
  Filled 2015-02-19: qty 3

## 2015-02-19 MED ORDER — ONDANSETRON HCL 4 MG PO TABS: 4.0000 mg | ORAL_TABLET | Freq: Four times a day (QID) | ORAL | Status: DC | PRN

## 2015-02-19 MED ORDER — METHOCARBAMOL 500 MG PO TABS: 500.0000 mg | ORAL_TABLET | Freq: Four times a day (QID) | ORAL | Status: DC | PRN

## 2015-02-19 MED ORDER — TRAMADOL HCL 50 MG PO TABS: 50.0000 mg | ORAL_TABLET | Freq: Four times a day (QID) | ORAL | Status: DC | PRN

## 2015-02-19 MED ORDER — HEPARIN SOD (PORK) LOCK FLUSH 100 UNIT/ML IV SOLN
250.0000 [IU] | INTRAVENOUS | Status: DC | PRN
  Administered 2015-02-19: 250 [IU]

## 2015-02-19 MED ORDER — VANCOMYCIN HCL IN DEXTROSE 750-5 MG/150ML-% IV SOLN: 750.0000 mg | Freq: Two times a day (BID) | INTRAVENOUS | Status: DC

## 2015-02-19 MED ORDER — VANCOMYCIN HCL IN DEXTROSE 750-5 MG/150ML-% IV SOLN
750.0000 mg | Freq: Two times a day (BID) | INTRAVENOUS | Status: DC
  Administered 2015-02-19: 750 mg via INTRAVENOUS
  Filled 2015-02-19: qty 150

## 2015-02-19 MED ORDER — OXYCODONE HCL 5 MG PO TABS: 5.0000 mg | ORAL_TABLET | ORAL | Status: DC | PRN

## 2015-02-19 NOTE — Progress Notes (Signed)
Occupational Therapy Treatment Patient Details Name: Beth [name] MRN: [id number] DOB: [birth year] Today's Date: 02/19/2015    History of present illness 69 yo female s/p R knee  resection arthroplasty with placement of Abx spacer;   OT comments  Pt making progress with functional goals and should continue with acute OT services to address impairments  Follow Up Recommendations  Home health OT;Supervision/Assistance - 24 hour    Equipment Recommendations  Other (comment) (ADL A/E)    Recommendations for Other Services      Precautions / Restrictions Precautions Precautions: Other (comment);Fall Precaution Comments: NO ROM R KNEE Required Braces or Orthoses: Knee Immobilizer - Right Knee Immobilizer - Right: On at all times Restrictions Weight Bearing Restrictions: No Other Position/Activity Restrictions: WBAT       Mobility Bed Mobility Overal bed mobility: Needs Assistance Bed Mobility: Supine to Sit     Supine to sit: Min assist;HOB elevated     General bed mobility comments: pt up in recliner  Transfers Overall transfer level: Needs assistance Equipment used: Rolling walker (2 wheeled) Transfers: Sit to/from Stand Sit to Stand: Min assist;Min guard         General transfer comment: cues for hand placement and wt shift    Balance           Standing balance support: Single extremity supported;Bilateral upper extremity supported;During functional activity Standing balance-[name]: Poor                     ADL                       Lower Body Dressing: Total assistance;Sit to/from stand;Maximal assistance;Cueing for safety   Toilet Transfer: Minimal assistance;Ambulation;RW;Min guard;Comfort height toilet;Grab bars   Toileting- Clothing Manipulation and Hygiene: Moderate assistance;Sit to/from stand;Minimal assistance         General ADL Comments: Pt requires increased time to transfer into bathroom to 3 in1 and back to  chair. Discussed using A/E at home and pt and her grand daughter stated that they would get A/E kit      Vision  no change from baseline                   Perception Perception Perception Tested?: No   Praxis Praxis Praxis tested?: Not tested    Cognition   Behavior During Therapy: [facility] for tasks assessed/performed Overall Cognitive Status: Within Functional Limits for tasks assessed                       Extremity/Trunk Assessment   generalized weakness                        General Comments  pt pleasant and cooperative, family suuportive    Pertinent Vitals/ Pain       Pain Assessment: 0-10 Pain Score: 6  Pain Location: R knee Pain Descriptors / Indicators: Sore Pain Intervention(s): Limited activity within patient's tolerance;Monitored during session;Premedicated before session;Repositioned;Ice applied  Home Living  home alone                                        Prior Functioning/Environment  independent           Frequency Min 2X/week     Progress Toward Goals  OT Goals(current goals can now be  found in the care plan section)  Progress towards OT goals: Not progressing toward goals - comment  Acute Rehab OT Goals Patient Stated Goal: be independent again  Plan Discharge plan remains appropriate                     End of Session Equipment Utilized During Treatment: Rolling walker;Right knee immobilizer   Activity Tolerance Patient tolerated treatment well   Patient Left in chair;with call bell/phone within reach;with family/visitor present             Time: 0029-8473 OT Time Calculation (min): 18 min  Charges: OT General Charges $OT Visit: 1 Procedure OT Treatments $Self Care/Home Management : 8-22 mins  Britt Bottom 02/19/2015, 12:57 PM

## 2015-02-19 NOTE — Discharge Summary (Signed)
Physician Discharge Summary   Patient ID: Beth [name] MRN: [id number] DOB/AGE: Jan 17, 1946 69 y.o.  Admit date: 02/16/2015 Discharge date: 3/14/20163  Primary Diagnosis:  Infected right total knee arthroplasty.  Admission Diagnoses:  Past Medical History  Diagnosis Date  . Hypertension   . CVA (cerebral infarction)     2006  . Hyperlipidemia   . Blood transfusion without reported diagnosis 2012    anemia;pt denies transfusion stated was only on iron tablet  . Heart murmur   . Nocturia     3-4 times per night  . Gout     left elbow  . Sleep apnea     no cpap  . Arthritis     Knee both knees  . Anemia   . 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 08-09-14 Right eye  . Complication of anesthesia 06-21-14    trouble waking up after colonscopy;pt denies   . Family history of anesthesia complication     sister very slow to awaken after anesthesia;severe vomiting   . Stroke 2006    x 1   Discharge Diagnoses:   Principal Problem:   Septic arthritis of knee, right Active Problems:   OA (osteoarthritis) of knee  Estimated body mass index is 34.61 kg/(m^2) as calculated from the following:   Height as of this encounter: 5' 5"  (1.651 m).   Weight as of this encounter: 94.348 kg (208 lb).  Procedure:  Procedure(s) (LRB): RIGHT KNEE RESECTION ARTHROPLASTY WITH ANTIBIOTIC SPACERS (Right)   Consults: None  HPI: Beth [name] is a 69 year old female with complex history in regard to her right knee. She had a primary total knee arthroplasty done last year complicated by infection. Had irrigation, debridement, and polyethylene exchange. She has had evidence of persistent infection and has not wanted any further operations until recently. We had discussed resection arthroplasty numerous occasions and she is at a stage now where she realized that the infection will not clear on its own and presents for resection arthroplasty, antibiotic Spacer.  Laboratory Data: Admission  on 02/16/2015, Discharged on 02/19/2015  Component Date Value Ref Range Status  . Specimen Description 02/16/2015 SYNOVIAL RIGHT KNEE   Final  . Special Requests 02/16/2015 NONE   Final  . Gram Stain 02/16/2015    Final                   Value:ABUNDANT WBC PRESENT, PREDOMINANTLY PMN NO ORGANISMS SEEN Performed at [facility] Gram Stain Report Called to,Read Back By and Verified With: Gram Stain Report Called to,Read Back By and Verified With: SHEPHERD K @1744  ON 02/16/2015 BY MCCOY.N Performed at [facility]   . Culture 02/16/2015    Final                   Value:NO ANAEROBES ISOLATED Performed at [facility]   . Report Status 02/16/2015 02/21/2015 FINAL   Final  . Specimen Description 02/16/2015 SYNOVIAL RIGHT KNEE   Final  . Special Requests 02/16/2015 NONE   Final  . Gram Stain 02/16/2015    Final                   Value:ABUNDANT WBC PRESENT, PREDOMINANTLY PMN NO ORGANISMS SEEN Gram Stain Report Called to,Read Back By and Verified With: SHEPHERD,K. RN @1744  ON 02/16/15 BY MCCOY,N.   . Report Status 02/16/2015 02/16/2015 FINAL   Final  . Specimen Description 02/16/2015 SYNOVIAL RIGHT KNEE   Final  . Special  Requests 02/16/2015 NONE   Final  . Gram Stain 02/16/2015    Final                   Value:ABUNDANT WBC PRESENT, PREDOMINANTLY PMN NO ORGANISMS SEEN Performed by [facility] Gram Stain Report Called to,Read Back By and Verified With: Gram Stain Report Called to,Read Back By and Verified With: SHEPHERD K @1744  ON 03.11.2016 BY MCCOY  N Performed at [facility]   . Culture 02/16/2015    Final                   Value:NO GROWTH 3 DAYS Performed at [facility]   . Report Status 02/16/2015 02/20/2015 FINAL   Final  . WBC 02/17/2015 8.3  4.0 - 10.5 K/uL Final  . RBC 02/17/2015 4.37  3.87 - 5.11 MIL/uL Final  . Hemoglobin 02/17/2015 10.3* 12.0 - 15.0 g/dL Final  . HCT 02/17/2015 33.3* 36.0 - 46.0 % Final  . MCV 02/17/2015 76.2*  78.0 - 100.0 fL Final  . MCH 02/17/2015 23.6* 26.0 - 34.0 pg Final  . MCHC 02/17/2015 30.9  30.0 - 36.0 g/dL Final  . RDW 02/17/2015 17.4* 11.5 - 15.5 % Final  . Platelets 02/17/2015 358  150 - 400 K/uL Final  . Sodium 02/17/2015 134* 135 - 145 mmol/L Final  . Potassium 02/17/2015 3.7  3.5 - 5.1 mmol/L Final  . Chloride 02/17/2015 99  96 - 112 mmol/L Final  . CO2 02/17/2015 27  19 - 32 mmol/L Final  . Glucose, Bld 02/17/2015 294* 70 - 99 mg/dL Final  . BUN 02/17/2015 26* 6 - 23 mg/dL Final  . Creatinine, Ser 02/17/2015 0.93  0.50 - 1.10 mg/dL Final  . Calcium 02/17/2015 8.6  8.4 - 10.5 mg/dL Final  . GFR calc non Af Amer 02/17/2015 60* >90 mL/min Final  . GFR calc Af Amer 02/17/2015 70* >90 mL/min Final   Comment: (NOTE) The eGFR has been calculated using the CKD EPI equation. This calculation has not been validated in all clinical situations. eGFR's persistently <90 mL/min signify possible Chronic Kidney Disease.   . Anion gap 02/17/2015 8  5 - 15 Final  . WBC 02/18/2015 12.9* 4.0 - 10.5 K/uL Final  . RBC 02/18/2015 3.71* 3.87 - 5.11 MIL/uL Final  . Hemoglobin 02/18/2015 8.7* 12.0 - 15.0 g/dL Final  . HCT 02/18/2015 28.5* 36.0 - 46.0 % Final  . MCV 02/18/2015 76.8* 78.0 - 100.0 fL Final  . MCH 02/18/2015 23.5* 26.0 - 34.0 pg Final  . MCHC 02/18/2015 30.5  30.0 - 36.0 g/dL Final  . RDW 02/18/2015 17.8* 11.5 - 15.5 % Final  . Platelets 02/18/2015 358  150 - 400 K/uL Final  . Sodium 02/18/2015 136  135 - 145 mmol/L Final  . Potassium 02/18/2015 4.0  3.5 - 5.1 mmol/L Final  . Chloride 02/18/2015 100  96 - 112 mmol/L Final  . CO2 02/18/2015 27  19 - 32 mmol/L Final  . Glucose, Bld 02/18/2015 240* 70 - 99 mg/dL Final  . BUN 02/18/2015 30* 6 - 23 mg/dL Final  . Creatinine, Ser 02/18/2015 1.24* 0.50 - 1.10 mg/dL Final  . Calcium 02/18/2015 8.4  8.4 - 10.5 mg/dL Final  . GFR calc non Af Amer 02/18/2015 43* >90 mL/min Final  . GFR calc Af Amer 02/18/2015 49* >90 mL/min Final    Comment: (NOTE) The eGFR has been calculated using the CKD EPI equation. This calculation has not been validated  in all clinical situations. eGFR's persistently <90 mL/min signify possible Chronic Kidney Disease.   . Anion gap 02/18/2015 9  5 - 15 Final  . Vancomycin Tr 02/18/2015 22.6* 10.0 - 20.0 ug/mL Final  . WBC 02/19/2015 10.2  4.0 - 10.5 K/uL Final  . RBC 02/19/2015 3.42* 3.87 - 5.11 MIL/uL Final  . Hemoglobin 02/19/2015 8.1* 12.0 - 15.0 g/dL Final  . HCT 02/19/2015 26.6* 36.0 - 46.0 % Final  . MCV 02/19/2015 77.8* 78.0 - 100.0 fL Final  . MCH 02/19/2015 23.7* 26.0 - 34.0 pg Final  . MCHC 02/19/2015 30.5  30.0 - 36.0 g/dL Final  . RDW 02/19/2015 18.2* 11.5 - 15.5 % Final  . Platelets 02/19/2015 306  150 - 400 K/uL Final  . Sodium 02/19/2015 138  135 - 145 mmol/L Final  . Potassium 02/19/2015 3.7  3.5 - 5.1 mmol/L Final  . Chloride 02/19/2015 105  96 - 112 mmol/L Final  . CO2 02/19/2015 27  19 - 32 mmol/L Final  . Glucose, Bld 02/19/2015 146* 70 - 99 mg/dL Final  . BUN 02/19/2015 25* 6 - 23 mg/dL Final  . Creatinine, Ser 02/19/2015 0.73  0.50 - 1.10 mg/dL Final   Comment: DELTA CHECK NOTED REPEATED TO VERIFY   . Calcium 02/19/2015 8.3* 8.4 - 10.5 mg/dL Final  . GFR calc non Af Amer 02/19/2015 84* >90 mL/min Final  . GFR calc Af Amer 02/19/2015 >90  >90 mL/min Final   Comment: (NOTE) The eGFR has been calculated using the CKD EPI equation. This calculation has not been validated in all clinical situations. eGFR's persistently <90 mL/min signify possible Chronic Kidney Disease.   Georgiann Hahn gap 02/19/2015 6  5 - 15 Final  Hospital Outpatient Visit on 02/09/2015  Component Date Value Ref Range Status  . MRSA, PCR 02/09/2015 NEGATIVE  NEGATIVE Final  . Staphylococcus aureus 02/09/2015 NEGATIVE  NEGATIVE Final   Comment:        The Xpert SA Assay (FDA approved for NASAL specimens in patients over 32 years of age), is one component of a comprehensive surveillance program.   Test performance has been validated by [facility] for patients greater than or equal to 15 year old. It is not intended to diagnose infection nor to guide or monitor treatment.   Marland Kitchen aPTT 02/09/2015 33  24 - 37 seconds Final  . WBC 02/09/2015 9.0  4.0 - 10.5 K/uL Final  . RBC 02/09/2015 4.81  3.87 - 5.11 MIL/uL Final  . Hemoglobin 02/09/2015 11.3* 12.0 - 15.0 g/dL Final  . HCT 02/09/2015 37.0  36.0 - 46.0 % Final  . MCV 02/09/2015 76.9* 78.0 - 100.0 fL Final  . MCH 02/09/2015 23.5* 26.0 - 34.0 pg Final  . MCHC 02/09/2015 30.5  30.0 - 36.0 g/dL Final  . RDW 02/09/2015 17.9* 11.5 - 15.5 % Final  . Platelets 02/09/2015 448* 150 - 400 K/uL Final  . Sodium 02/09/2015 141  135 - 145 mmol/L Final  . Potassium 02/09/2015 4.0  3.5 - 5.1 mmol/L Final  . Chloride 02/09/2015 102  96 - 112 mmol/L Final  . CO2 02/09/2015 29  19 - 32 mmol/L Final  . Glucose, Bld 02/09/2015 109* 70 - 99 mg/dL Final  . BUN 02/09/2015 25* 6 - 23 mg/dL Final  . Creatinine, Ser 02/09/2015 0.98  0.50 - 1.10 mg/dL Final  . Calcium 02/09/2015 9.6  8.4 - 10.5 mg/dL Final  . Total Protein 02/09/2015 8.1  6.0 - 8.3 g/dL Final  .  Albumin 02/09/2015 3.6  3.5 - 5.2 g/dL Final  . AST 02/09/2015 18  0 - 37 U/L Final  . ALT 02/09/2015 11  0 - 35 U/L Final  . Alkaline Phosphatase 02/09/2015 119* 39 - 117 U/L Final  . Total Bilirubin 02/09/2015 0.8  0.3 - 1.2 mg/dL Final  . GFR calc non Af Amer 02/09/2015 57* >90 mL/min Final  . GFR calc Af Amer 02/09/2015 66* >90 mL/min Final   Comment: (NOTE) The eGFR has been calculated using the CKD EPI equation. This calculation has not been validated in all clinical situations. eGFR's persistently <90 mL/min signify possible Chronic Kidney Disease.   . Anion gap 02/09/2015 10  5 - 15 Final  . Prothrombin Time 02/09/2015 14.5  11.6 - 15.2 seconds Final  . INR 02/09/2015 1.12  0.00 - 1.49 Final  . ABO/RH(D) 02/09/2015 O POS   Final  . Antibody Screen 02/09/2015 NEG   Final  . Sample  Expiration 02/09/2015 02/19/2015   Final  . Color, Urine 02/09/2015 YELLOW  YELLOW Final  . APPearance 02/09/2015 CLEAR  CLEAR Final  . Specific Gravity, Urine 02/09/2015 1.017  1.005 - 1.030 Final  . pH 02/09/2015 7.0  5.0 - 8.0 Final  . Glucose, UA 02/09/2015 NEGATIVE  NEGATIVE mg/dL Final  . Hgb urine dipstick 02/09/2015 NEGATIVE  NEGATIVE Final  . Bilirubin Urine 02/09/2015 NEGATIVE  NEGATIVE Final  . Ketones, ur 02/09/2015 NEGATIVE  NEGATIVE mg/dL Final  . Protein, ur 02/09/2015 NEGATIVE  NEGATIVE mg/dL Final  . Urobilinogen, UA 02/09/2015 0.2  0.0 - 1.0 mg/dL Final  . Nitrite 02/09/2015 NEGATIVE  NEGATIVE Final  . Leukocytes, UA 02/09/2015 NEGATIVE  NEGATIVE Final   MICROSCOPIC NOT DONE ON URINES WITH NEGATIVE PROTEIN, BLOOD, LEUKOCYTES, NITRITE, OR GLUCOSE <1000 mg/dL.     X-Rays:No results found.  EKG: Orders placed or performed in visit on 05/31/14  . EKG 12-Lead     Hospital Course: EDA MAGNUSSEN is a 69 y.o. who was admitted to [facility]. They were brought to the operating room on 02/16/2015 and underwent Procedure(s): RIGHT KNEE RESECTION ARTHROPLASTY WITH ANTIBIOTIC SPACERS.  Patient tolerated the procedure well and was later transferred to the recovery room and then to the orthopaedic floor for postoperative care.  They were given PO and IV analgesics for pain control following their surgery.  They were given 24 hours of postoperative antibiotics of  Anti-infectives    Start     Dose/Rate Route Frequency Ordered Stop   02/19/15 1600  vancomycin (VANCOCIN) IVPB 750 mg/150 ml premix  Status:  Discontinued     750 mg 150 mL/hr over 60 Minutes Intravenous Every 12 hours 02/19/15 0033 02/19/15 1935   02/19/15 0000  Vancomycin (VANCOCIN) 750 MG/150ML SOLN     750 mg 150 mL/hr over 60 Minutes Intravenous Every 12 hours 02/19/15 0730     02/17/15 2200  vancomycin (VANCOCIN) IVPB 1000 mg/200 mL premix  Status:  Discontinued     1,000 mg 200 mL/hr over 60 Minutes  Intravenous Every 12 hours 02/17/15 0854 02/19/15 0033   02/17/15 1000  vancomycin (VANCOCIN) 1,500 mg in sodium chloride 0.9 % 500 mL IVPB     1,500 mg 250 mL/hr over 120 Minutes Intravenous  Once 02/17/15 0853 02/17/15 1315   02/17/15 0600  ceFAZolin (ANCEF) IVPB 2 g/50 mL premix    Comments:  HOLD ANTIBIOTIC AT TIME OF SURGERY UNTIL NEW INTRAOPERATIVE CULTURES CAN BE OBTAINED.   2 g 100 mL/hr over  30 Minutes Intravenous On call to O.R. 02/16/15 1331 02/16/15 1551   02/16/15 2300  ceFAZolin (ANCEF) IVPB 1 g/50 mL premix  Status:  Discontinued     1 g 100 mL/hr over 30 Minutes Intravenous 3 times per day 02/16/15 2103 02/17/15 1026   02/16/15 1656  vancomycin (VANCOCIN) powder  Status:  Discontinued       As needed 02/16/15 1656 02/16/15 1742     and started on DVT prophylaxis in the form of Xarelto.   PT and OT were ordered for total joint protocol.  Discharge planning consulted to help with postop disposition and equipment needs.  Patient had a decnt night on the evening of surgery.  They started to get up OOB with therapy on day one. Advanced the diet.  Up with therapy.  PICC line ordered for IV antibiotics   Knee Immobilizer at all times. No range of motion to right knee.  Vancomycin per pharmacy.  Hemovac drains were sewn in.  Continued to work with therapy into day two.  Dressing was changed on day two and the incision was healing well.  By day three, the patient had progressed with therapy and meeting their goals.  Incision was healing well.  Patient was seen in rounds and was ready to go home.  Discharge home with home health Diet - Cardiac diet Follow up - in 3 days, this Thursday 02/22/2015 as per Dr. Wynelle Link Activity - WBAT Disposition - Home Condition Upon Discharge - Stable D/C Meds - See DC Summary DVT Prophylaxis - Xarelto      Discharge Instructions    Call MD / Call 911    Complete by:  As directed   If you experience chest pain or shortness of breath, CALL 911 and be  transported to the hospital emergency room.  If you develope a fever above 101 F, pus (white drainage) or increased drainage or redness at the wound, or calf pain, call your surgeon's office.     Change dressing    Complete by:  As directed   Change dressing daily with sterile 4 x 4 inch gauze dressing and apply TED hose. Do not submerge the incision under water.     Constipation Prevention    Complete by:  As directed   Drink plenty of fluids.  Prune juice may be helpful.  You may use a stool softener, such as Colace (over the counter) 100 mg twice a day.  Use MiraLax (over the counter) for constipation as needed.     Continue PICC at discharge    Complete by:  As directed   Flush PICC line per home health policy:  Yes     Diet - low sodium heart healthy    Complete by:  As directed      Discharge instructions    Complete by:  As directed   Pick up stool softner and laxative for home use following surgery while on pain medications. Do not submerge incision under water. Please use good hand washing techniques while changing dressing each day. May shower starting three days after surgery. Please use a clean towel to pat the incision dry following showers. Continue to use ice for pain and swelling after surgery. Do not use any lotions or creams on the incision until instructed by your surgeon.  Take Xarelto for two more weeks, then discontinue Xarelto. Once the patient has completed the blood thinner regimen, then take a 325 mg Aspirin daily for three more weeks.  Postoperative  Constipation Protocol  Constipation - defined medically as fewer than three stools per week and severe constipation as less than one stool per week.  One of the most common issues patients have following surgery is constipation.  Even if you have a regular bowel pattern at home, your normal regimen is likely to be disrupted due to multiple reasons following surgery.  Combination of anesthesia, postoperative  narcotics, change in appetite and fluid intake all can affect your bowels.  In order to avoid complications following surgery, here are some recommendations in order to help you during your recovery period.  Colace (docusate) - Pick up an over-the-counter form of Colace or another stool softener and take twice a day as long as you are requiring postoperative pain medications.  Take with a full glass of water daily.  If you experience loose stools or diarrhea, hold the colace until you stool forms back up.  If your symptoms do not get better within 1 week or if they get worse, check with your doctor.  Dulcolax (bisacodyl) - Pick up over-the-counter and take as directed by the product packaging as needed to assist with the movement of your bowels.  Take with a full glass of water.  Use this product as needed if not relieved by Colace only.   MiraLax (polyethylene glycol) - Pick up over-the-counter to have on hand.  MiraLax is a solution that will increase the amount of water in your bowels to assist with bowel movements.  Take as directed and can mix with a glass of water, juice, soda, coffee, or tea.  Take if you go more than two days without a movement. Do not use MiraLax more than once per day. Call your doctor if you are still constipated or irregular after using this medication for 7 days in a row.  If you continue to have problems with postoperative constipation, please contact the office for further assistance and recommendations.  If you experience "the worst abdominal pain ever" or develop nausea or vomiting, please contact the office immediatly for further recommendations for treatment.     Do not sit on low chairs, stoools or toilet seats, as it may be difficult to get up from low surfaces    Complete by:  As directed      Driving restrictions    Complete by:  As directed   No driving until released by the physician.     Increase activity slowly as tolerated    Complete by:  As directed    No range of motion, no bending of knee.     Lifting restrictions    Complete by:  As directed   No lifting until released by the physician.     Patient may shower    Complete by:  As directed   You may shower without a dressing once there is no drainage.  Do not wash over the wound.  If drainage remains, do not shower until drainage stops.     TED hose    Complete by:  As directed   Use stockings (TED hose) for 3 weeks on both leg(s).  You may remove them at night for sleeping.     Weight bearing as tolerated    Complete by:  As directed   Laterality:  right  Extremity:  Lower            Medication List    STOP taking these medications        cyclobenzaprine 10 MG tablet  Commonly known as:  FLEXERIL      TAKE these medications        amLODipine 10 MG tablet  Commonly known as:  NORVASC  Take 1 tablet (10 mg total) by mouth every evening.     atorvastatin 20 MG tablet  Commonly known as:  LIPITOR  Take 1 tablet (20 mg total) by mouth every evening.     doxycycline 100 MG tablet  Commonly known as:  VIBRA-TABS  Take 100 mg by mouth 2 (two) times daily.     furosemide 80 MG tablet  Commonly known as:  LASIX  Take 80 mg by mouth every other day.     methocarbamol 500 MG tablet  Commonly known as:  ROBAXIN  Take 1 tablet (500 mg total) by mouth every 6 (six) hours as needed for muscle spasms.     nebivolol 10 MG tablet  Commonly known as:  BYSTOLIC  Take 1 tablet (10 mg total) by mouth every evening.     olmesartan-hydrochlorothiazide 40-12.5 MG per tablet  Commonly known as:  BENICAR HCT  Take 1 tablet by mouth every evening.     ondansetron 4 MG tablet  Commonly known as:  ZOFRAN  Take 1 tablet (4 mg total) by mouth every 6 (six) hours as needed for nausea.     oxyCODONE 5 MG immediate release tablet  Commonly known as:  Oxy IR/ROXICODONE  Take 1-2 tablets (5-10 mg total) by mouth every 3 (three) hours as needed for moderate pain, severe pain or  breakthrough pain.     rivaroxaban 10 MG Tabs tablet  Commonly known as:  XARELTO  - Take 1 tablet (10 mg total) by mouth daily with breakfast. Take Xarelto for two more weeks, then discontinue Xarelto.  - Once the patient has completed the blood thinner regimen, then take a 325 mg Aspirin daily for three more weeks.     sodium chloride 0.65 % Soln nasal spray  Commonly known as:  OCEAN  Place 1 spray into both nostrils 2 (two) times daily as needed for congestion.     traMADol 50 MG tablet  Commonly known as:  ULTRAM  Take 1-2 tablets (50-100 mg total) by mouth every 6 (six) hours as needed (mild pain).     Vancomycin 750 MG/150ML Soln  Commonly known as:  VANCOCIN  - Inject 150 mLs (750 mg total) into the vein every 12 (twelve) hours. Give 750 mg IV every 12 hours via PICC line.  - Total of 80 doses to complete a six week treatment course.       Follow-up Information    Follow up with Gearlean Alf, MD. Schedule an appointment as soon as possible for a visit on 02/27/2015.   Specialty:  Orthopedic Surgery   Why:  Call office at [phone] to setup follow up appintment next Tuesday 02/27/2015.   Contact information:   [address] [place] [address] [phone]       Signed: [name], PA-C Orthopaedic Surgery 03/01/2015, 9:53 AM

## 2015-02-19 NOTE — Progress Notes (Signed)
PHARMACY - VANCOMYCIN (brief note)  Patient on Vancomycin 1000mg  IV q12h for wound infection  Vancomycin trough level = 22.6 mcg/ml (Goal 15-20 mcg/ml) Scr = 1.24 with CrCl ~ 47 ml/min Last Vancomycin 1gm dose given @ 22:41 on 3/13  Plan: Decrease Vancomycin to 750mg  IV q12h (will start @ 16:00 on 3/14 to allow time for level to fall).  Leone Haven, PharmD

## 2015-02-19 NOTE — Progress Notes (Signed)
   Subjective: 3 Days Post-Op Procedure(s) (LRB): RIGHT KNEE RESECTION ARTHROPLASTY WITH ANTIBIOTIC SPACERS (Right) Patient reports pain as mild.   Patient seen in rounds by Dr. Wynelle Link. Patient is well, but has had some minor complaints of pain in the knee, requiring pain medications Patient is ready to go home today  Objective: Vital signs in last 24 hours: Temp:  [97.8 F (36.6 C)-98.7 F (37.1 C)] 98.7 F (37.1 C) (03/14 0533) Pulse Rate:  [73-77] 74 (03/14 0533) Resp:  [16] 16 (03/14 0533) BP: (148-163)/(71-83) 163/83 mmHg (03/14 0533) SpO2:  [94 %-97 %] 94 % (03/14 0533)  Intake/Output from previous day:  Intake/Output Summary (Last 24 hours) at 02/19/15 0718 Last data filed at 02/18/15 2200  Gross per 24 hour  Intake 1068.83 ml  Output    575 ml  Net 493.83 ml    Labs:  Recent Labs  02/17/15 0500 02/18/15 0400 02/19/15 0540  HGB 10.3* 8.7* 8.1*    Recent Labs  02/18/15 0400 02/19/15 0540  WBC 12.9* 10.2  RBC 3.71* 3.42*  HCT 28.5* 26.6*  PLT 358 306    Recent Labs  02/17/15 0500 02/18/15 0400  NA 134* 136  K 3.7 4.0  CL 99 100  CO2 27 27  BUN 26* 30*  CREATININE 0.93 1.24*  GLUCOSE 294* 240*  CALCIUM 8.6 8.4   No results for input(s): LABPT, INR in the last 72 hours.  EXAM: General - Patient is Alert and Appropriate Extremity - Neurovascular intact Sensation intact distally Dorsiflexion/Plantar flexion intact Incision - clean, dry, no drainage, healing Motor Function - intact, moving foot and toes well on exam.   Assessment/Plan: 3 Days Post-Op Procedure(s) (LRB): RIGHT KNEE RESECTION ARTHROPLASTY WITH ANTIBIOTIC SPACERS (Right) Procedure(s) (LRB): RIGHT KNEE RESECTION ARTHROPLASTY WITH ANTIBIOTIC SPACERS (Right) Past Medical History  Diagnosis Date  . Hypertension   . CVA (cerebral infarction)     2006  . Hyperlipidemia   . Blood transfusion without reported diagnosis 2012    anemia;pt denies transfusion stated was only on  iron tablet  . Heart murmur   . Nocturia     3-4 times per night  . Gout     left elbow  . Sleep apnea     no cpap  . Arthritis     Knee both knees  . Anemia   . 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 08-09-14 Right eye  . Complication of anesthesia 06-21-14    trouble waking up after colonscopy;pt denies   . Family history of anesthesia complication     sister very slow to awaken after anesthesia;severe vomiting   . Stroke 2006    x 1   Principal Problem:   Septic arthritis of knee, right Active Problems:   OA (osteoarthritis) of knee  Estimated body mass index is 34.61 kg/(m^2) as calculated from the following:   Height as of this encounter: 5\' 5"  (1.651 m).   Weight as of this encounter: 94.348 kg (208 lb). Up with therapy Discharge home with home health Diet - Cardiac diet Follow up - in 3 days, this Thursday 02/22/2015 as per Dr. Wynelle Link Activity - WBAT Disposition - Home Condition Upon Discharge - Stable D/C Meds - See DC Summary DVT Prophylaxis - Xarelto  [name], PA-C Orthopaedic Surgery 02/19/2015, 7:18 AM

## 2015-02-19 NOTE — Progress Notes (Signed)
Physical Therapy Treatment Patient Details Name: Beth [name] MRN: [id number] DOB: 21-Dec-1945 Today's Date: 02/19/2015    History of Present Illness 69 yo female s/p R knee  resection arthroplasty with placement of Abx spacer;    PT Comments    Pt motivated but continues to fatigue quickly; Hgb 8.1 today; Pt reports she will have 24hr care if needed at home (goin gto her son's house); discussed options for car transfer  Follow Up Recommendations  Home health PT;Supervision for mobility/OOB     Equipment Recommendations  None recommended by PT    Recommendations for Other Services       Precautions / Restrictions Precautions Precautions: Other (comment);Fall Precaution Comments: NO ROM R KNEE Required Braces or Orthoses: Knee Immobilizer - Right Knee Immobilizer - Right: On at all times Restrictions Other Position/Activity Restrictions: WBAT    Mobility  Bed Mobility Overal bed mobility: Needs Assistance Bed Mobility: Supine to Sit     Supine to sit: Min assist;HOB elevated     General bed mobility comments: assist with RLE, incr time  Transfers Overall transfer level: Needs assistance Equipment used: Rolling walker (2 wheeled) Transfers: Sit to/from Stand Sit to Stand: Min assist;Min guard         General transfer comment: cues for hand placement and wt shift  Ambulation/Gait Ambulation/Gait assistance: Min guard Ambulation Distance (Feet): 30 Feet Assistive device: Rolling walker (2 wheeled) Gait Pattern/deviations: Step-to pattern;Antalgic;Trunk flexed     General Gait Details: cues for sequence, use of UEs; effortful gait, incr time to compete distance, chair follow for safety; continues to fatigue quickly   Stairs            Wheelchair Mobility    Modified Rankin (Stroke Patients Only)       Balance                                    Cognition Arousal/Alertness: Awake/alert Behavior During Therapy: WFL for tasks  assessed/performed Overall Cognitive Status: Within Functional Limits for tasks assessed                      Exercises      General Comments        Pertinent Vitals/Pain Pain Assessment: 0-10 Pain Score: 4  Pain Location: R knee Pain Descriptors / Indicators: Sore Pain Intervention(s): Limited activity within patient's tolerance;Monitored during session;Repositioned;Ice applied    Home Living                      Prior Function            PT Goals (current goals can now be found in the care plan section) Acute Rehab PT Goals Patient Stated Goal: be independent again PT Goal Formulation: With patient Time For Goal Achievement: 02/21/15 Potential to Achieve Goals: Good Progress towards PT goals: Progressing toward goals    Frequency  7X/week    PT Plan Current plan remains appropriate    Co-evaluation             End of Session Equipment Utilized During Treatment: Right knee immobilizer Activity Tolerance: Patient limited by fatigue;Patient limited by pain Patient left: in chair;with call bell/phone within reach     Time: 1023-1047 PT Time Calculation (min) (ACUTE ONLY): 24 min  Charges:  $Gait Training: 23-37 mins  G CodesKenyon [name] 02/19/2015, 10:53 AM

## 2015-02-20 LAB — BODY FLUID CULTURE: Culture: NO GROWTH

## 2015-02-21 LAB — ANAEROBIC CULTURE

## 2015-03-07 ENCOUNTER — Other Ambulatory Visit: Payer: Self-pay | Admitting: Surgical

## 2015-03-07 ENCOUNTER — Ambulatory Visit (HOSPITAL_COMMUNITY)
Admission: RE | Admit: 2015-03-07 | Discharge: 2015-03-07 | Disposition: A | Discharge status: Home / Self Care | Payer: Medicare Other | Source: Ambulatory Visit | Attending: Surgical | Admitting: Surgical

## 2015-03-07 ENCOUNTER — Other Ambulatory Visit (HOSPITAL_COMMUNITY): Payer: Self-pay | Admitting: Surgical

## 2015-03-07 DIAGNOSIS — T82898A Other specified complication of vascular prosthetic devices, implants and grafts, initial encounter: Secondary | ICD-10-CM

## 2015-03-07 NOTE — Procedures (Signed)
Patient came in today with complaint of long infusion time yesterday.  She gets vancomycin at home and it took 8 hours to infuse by gravity.  I inspected the catheter.  The site look good and no kinks evident.  I did exchange the cap for one of our spring claves and flushed the catheter with multiple 3 cc syringes of normal saline.  I was not able to aspirate but it did appear to flush easier. Since the patient would prefer not to exchange the catheter, I advised her to try her medication administration tonight and see if the flushing helped.  She will call tomorrow if she is still having issues and will decide overnight if she would like [place] to exchange the picc at that time.

## 2015-03-19 ENCOUNTER — Encounter: Payer: Self-pay | Admitting: Family Medicine

## 2015-03-19 ENCOUNTER — Ambulatory Visit (INDEPENDENT_AMBULATORY_CARE_PROVIDER_SITE_OTHER): Payer: Medicare Other | Admitting: Family Medicine

## 2015-03-19 VITALS — BP 155/72 | HR 80 | Temp 97.7°F | Ht 65.0 in | Wt 209.0 lb

## 2015-03-19 DIAGNOSIS — I10 Essential (primary) hypertension: Secondary | ICD-10-CM

## 2015-03-19 DIAGNOSIS — D509 Iron deficiency anemia, unspecified: Secondary | ICD-10-CM

## 2015-03-19 MED ORDER — AMLODIPINE BESYLATE 10 MG PO TABS: 10.0000 mg | ORAL_TABLET | Freq: Every evening | ORAL | Status: DC

## 2015-03-19 MED ORDER — ATORVASTATIN CALCIUM 20 MG PO TABS: 20.0000 mg | ORAL_TABLET | Freq: Every evening | ORAL | Status: DC

## 2015-03-19 MED ORDER — NEBIVOLOL HCL 10 MG PO TABS: 10.0000 mg | ORAL_TABLET | Freq: Every evening | ORAL | Status: DC

## 2015-03-19 MED ORDER — OLMESARTAN MEDOXOMIL-HCTZ 40-12.5 MG PO TABS: 1.0000 | ORAL_TABLET | Freq: Every evening | ORAL | Status: DC

## 2015-03-19 MED ORDER — FERROUS FUMARATE 325 (106 FE) MG PO TABS: 1.0000 | ORAL_TABLET | Freq: Two times a day (BID) | ORAL | Status: DC

## 2015-03-19 NOTE — Patient Instructions (Signed)
Use miralax daily as needed for constipation

## 2015-03-19 NOTE — Progress Notes (Signed)
Subjective:  Patient ID: Beth [name], female    DOB: [birth year]  Age: 69 y.o. MRN: [id number]  CC: Hypertension   HPI Beth [name] presents for follow-up of hypertension. Patient has no history of headache chest pain or shortness of breath or recent cough. Patient also denies symptoms of TIA such as numbness weakness lateralizing. Patient checks  blood pressure at home and has not had any elevated readings recently. Patient denies side effects from medication. States taking it regularly. Significant anemia noted on March 14 hemoglobin 8.1 with decreased indices indicating iron deficiency likely.  History Beth [name] has a past medical history of Hypertension; CVA (cerebral infarction); Hyperlipidemia; Blood transfusion without reported diagnosis (2012); Heart murmur; Nocturia; Gout; Sleep apnea; Arthritis; Anemia; Herpes infection (08-09-14); Complication of anesthesia (06-21-14); Family history of anesthesia complication; and Stroke (2006).   She has past surgical history that includes Carpal tunnel release (Right, 1983); Abdominal hysterectomy (1983); colonscopy (June 21, 2014); nasal cauterization (2012); Total knee arthroplasty (Right, 07/03/2014); Patellar tendon repair (Right, 08/11/2014); Joint replacement (06/2014); I&D knee with poly exchange (Right, 10/02/2014); and Excisional total knee arthroplasty with antibiotic spacers (Right, 02/16/2015).   Her family history includes Brain cancer in her mother; Diabetes in her son and son; Ovarian cancer in her mother. There is no history of Colon cancer, Esophageal cancer, Stomach cancer, or Rectal cancer.She reports that she has never smoked. She has never used smokeless tobacco. She reports that she does not drink alcohol or use illicit drugs.  Current Outpatient Prescriptions on File Prior to Visit  Medication Sig Dispense Refill  . oxyCODONE (OXY IR/ROXICODONE) 5 MG immediate release tablet Take 1-2 tablets (5-10 mg total) by mouth every 3 (three)  hours as needed for moderate pain, severe pain or breakthrough pain. 90 tablet 0  . sodium chloride (OCEAN) 0.65 % SOLN nasal spray Place 1 spray into both nostrils 2 (two) times daily as needed for congestion.    . traMADol (ULTRAM) 50 MG tablet Take 1-2 tablets (50-100 mg total) by mouth every 6 (six) hours as needed (mild pain). 80 tablet 1  . Vancomycin (VANCOCIN) 750 MG/150ML SOLN Inject 150 mLs (750 mg total) into the vein every 12 (twelve) hours. Give 750 mg IV every 12 hours via PICC line. Total of 80 doses to complete a six week treatment course. 750 mL 79   Current Facility-Administered Medications on File Prior to Visit  Medication Dose Route Frequency Provider Last Rate Last Dose  . tranexamic acid (CYKLOKAPRON) 2,000 mg in sodium chloride 0.9 % 50 mL Topical Application  6,073 mg Topical Once [name], MD        ROS Review of Systems  Constitutional: Negative for fever, chills, diaphoresis, appetite change, fatigue and unexpected weight change.  HENT: Negative for congestion, ear pain, hearing loss, postnasal drip, rhinorrhea, sneezing, sore throat and trouble swallowing.   Eyes: Negative for pain.  Respiratory: Negative for cough, chest tightness and shortness of breath.   Cardiovascular: Negative for chest pain and palpitations.  Gastrointestinal: Negative for nausea, vomiting, abdominal pain, diarrhea and constipation.  Genitourinary: Negative for dysuria, frequency and menstrual problem.  Musculoskeletal: Negative for joint swelling and arthralgias.  Skin: Negative for rash.  Neurological: Negative for dizziness, weakness, numbness and headaches.  Psychiatric/Behavioral: Negative for dysphoric mood and agitation.    Objective:  BP 155/72 mmHg  Pulse 80  Temp(Src) 97.7 F (36.5 C) (Oral)  Ht 5\' 5"  (1.651 m)  Wt 209 lb (94.802 kg)  BMI 34.78 kg/m2  BP Readings from Last 3 Encounters:  03/19/15 155/72  10/20/14 122/79  10/11/14 129/84    Wt Readings from  Last 3 Encounters:  03/19/15 209 lb (94.802 kg)  10/20/14 221 lb (100.245 kg)  10/11/14 222 lb (100.699 kg)     Physical Exam  Constitutional: She is oriented to person, place, and time. She appears well-developed and well-nourished. No distress.  HENT:  Head: Normocephalic and atraumatic.  Right Ear: External ear normal.  Left Ear: External ear normal.  Nose: Nose normal.  Mouth/Throat: Oropharynx is clear and moist.  Eyes: Conjunctivae and EOM are normal. Pupils are equal, round, and reactive to light.  Neck: Normal range of motion. Neck supple. No thyromegaly present.  Cardiovascular: Normal rate, regular rhythm and normal heart sounds.   No murmur heard. Pulmonary/Chest: Effort normal and breath sounds normal. No respiratory distress. She has no wheezes. She has no rales.  Abdominal: Soft. Bowel sounds are normal. She exhibits no distension. There is no tenderness.  Lymphadenopathy:    She has no cervical adenopathy.  Neurological: She is alert and oriented to person, place, and time. She has normal reflexes.  Skin: Skin is warm and dry.  Psychiatric: She has a normal mood and affect. Her behavior is normal. Judgment and thought content normal.        Ir Patient Eval Tech 0-60 Mins  03/07/2015   Garvin Fila [facility]     03/07/2015 12:47 PM Patient came in today with complaint of long infusion time  yesterday.  She gets vancomycin at home and it took 8 hours to  infuse by gravity.  I inspected the catheter.  The site look good  and no kinks evident.  I did exchange the cap for one of our  spring claves and flushed the catheter with multiple 3 cc  syringes of normal saline.  I was not able to aspirate but it did  appear to flush easier. Since the patient would prefer not to  exchange the catheter, I advised her to try her medication  administration tonight and see if the flushing helped.  She will  call tomorrow if she is still having issues and will decide  overnight if she would like s  to exchange the picc at that time.     Assessment & Plan:   Beth [name] was seen today for hypertension.  Diagnoses and all orders for this visit:  Essential hypertension Orders: -     Anemia Profile B -     POCT CBC  Iron deficiency anemia Orders: -     Anemia Profile B -     POCT CBC  Other orders -     amLODipine (NORVASC) 10 MG tablet; Take 1 tablet (10 mg total) by mouth every evening. -     atorvastatin (LIPITOR) 20 MG tablet; Take 1 tablet (20 mg total) by mouth every evening. -     nebivolol (BYSTOLIC) 10 MG tablet; Take 1 tablet (10 mg total) by mouth every evening. -     olmesartan-hydrochlorothiazide (BENICAR HCT) 40-12.5 MG per tablet; Take 1 tablet by mouth every evening.  I have discontinued Ms. Harari's furosemide, doxycycline, methocarbamol, ondansetron, and rivaroxaban. I am also having her maintain her sodium chloride, oxyCODONE, traMADol, Vancomycin, cyclobenzaprine, sodium chloride, amLODipine, atorvastatin, nebivolol, and olmesartan-hydrochlorothiazide.  Meds ordered this encounter  Medications  . DISCONTD: vancomycin (VANCOCIN) 10 G SOLR injection    Sig:   . cyclobenzaprine (FLEXERIL) 10 MG tablet    Sig: Take 10 mg  by mouth 3 (three) times daily as needed.   . sodium chloride 0.9 % infusion    Sig:   . amLODipine (NORVASC) 10 MG tablet    Sig: Take 1 tablet (10 mg total) by mouth every evening.    Dispense:  90 tablet    Refill:  4  . atorvastatin (LIPITOR) 20 MG tablet    Sig: Take 1 tablet (20 mg total) by mouth every evening.    Dispense:  90 tablet    Refill:  4  . nebivolol (BYSTOLIC) 10 MG tablet    Sig: Take 1 tablet (10 mg total) by mouth every evening.    Dispense:  90 tablet    Refill:  4  . olmesartan-hydrochlorothiazide (BENICAR HCT) 40-12.5 MG per tablet    Sig: Take 1 tablet by mouth every evening.    Dispense:  90 tablet    Refill:  4     Follow-up: Return in about 3 months (around 06/18/2015).  Claretta Fraise, M.D.

## 2015-03-20 LAB — ANEMIA PROFILE B
BASOS: 0 %
Basophils Absolute: 0.1 10*3/uL (ref 0.0–0.2)
EOS ABS: 0.2 10*3/uL (ref 0.0–0.4)
EOS: 2 %
FOLATE: 8.8 ng/mL (ref >3.0)
Ferritin: 157 ng/mL — ABNORMAL HIGH (ref 15–150)
HCT: 30.6 % — ABNORMAL LOW (ref 34.0–46.6)
Hemoglobin: 9.1 g/dL — ABNORMAL LOW (ref 11.1–15.9)
IMMATURE GRANS (ABS): 0 10*3/uL (ref 0.0–0.1)
IRON SATURATION: 6 % — AB (ref 15–55)
Immature Granulocytes: 0 %
Iron: 15 ug/dL — ABNORMAL LOW (ref 27–139)
Lymphocytes Absolute: 1.2 10*3/uL (ref 0.7–3.1)
Lymphs: 11 %
MCH: 23.4 pg — ABNORMAL LOW (ref 26.6–33.0)
MCHC: 29.7 g/dL — ABNORMAL LOW (ref 31.5–35.7)
MCV: 79 fL (ref 79–97)
MONOCYTES: 8 %
Monocytes Absolute: 0.9 10*3/uL (ref 0.1–0.9)
NEUTROS PCT: 79 %
Neutrophils Absolute: 8.8 10*3/uL — ABNORMAL HIGH (ref 1.4–7.0)
Platelets: 458 10*3/uL — ABNORMAL HIGH (ref 150–379)
RBC: 3.89 x10E6/uL (ref 3.77–5.28)
RDW: 16.5 % — ABNORMAL HIGH (ref 12.3–15.4)
Retic Ct Pct: 1.7 % (ref 0.6–2.6)
Total Iron Binding Capacity: 238 ug/dL — ABNORMAL LOW (ref 250–450)
UIBC: 223 ug/dL (ref 118–369)
VITAMIN B 12: 511 pg/mL (ref 211–946)
WBC: 11.2 10*3/uL — AB (ref 3.4–10.8)

## 2015-04-12 ENCOUNTER — Encounter: Payer: Self-pay | Admitting: Family Medicine

## 2015-04-17 ENCOUNTER — Ambulatory Visit: Payer: Self-pay | Admitting: Orthopedic Surgery

## 2015-04-17 NOTE — Progress Notes (Signed)
Preoperative surgical orders have been place into the Epic hospital system for Beth [name] on 04/17/2015, 9:32 AM  by Mickel Crow for surgery on 05-04-2015.  Preop Total Knee orders including Experal, IV Tylenol, and IV Decadron as long as there are no contraindications to the above medications. [name], PA-C

## 2015-04-18 ENCOUNTER — Ambulatory Visit (INDEPENDENT_AMBULATORY_CARE_PROVIDER_SITE_OTHER): Payer: Medicare Other | Admitting: Family Medicine

## 2015-04-18 ENCOUNTER — Encounter: Payer: Self-pay | Admitting: Family Medicine

## 2015-04-18 VITALS — BP 149/81 | HR 73 | Temp 98.5°F | Ht 65.0 in | Wt 209.0 lb

## 2015-04-18 DIAGNOSIS — D509 Iron deficiency anemia, unspecified: Secondary | ICD-10-CM | POA: Diagnosis not present

## 2015-04-18 DIAGNOSIS — Z01818 Encounter for other preprocedural examination: Secondary | ICD-10-CM

## 2015-04-18 DIAGNOSIS — E785 Hyperlipidemia, unspecified: Secondary | ICD-10-CM | POA: Diagnosis not present

## 2015-04-18 DIAGNOSIS — I10 Essential (primary) hypertension: Secondary | ICD-10-CM

## 2015-04-18 MED ORDER — NEBIVOLOL HCL 20 MG PO TABS: 20.0000 mg | ORAL_TABLET | Freq: Every evening | ORAL | Status: DC

## 2015-04-18 NOTE — Progress Notes (Signed)
Subjective:  Patient ID: Beth [name], female    DOB: Mar 14, 1946  Age: 69 y.o. MRN: [id number]  CC: Anemia   HPI Beth [name] presents for  follow-up of hypertension. Patient has no history of headache chest pain or shortness of breath or recent cough. Patient also denies symptoms of TIA such as numbness weakness lateralizing. Patient checks  blood pressure at home and has not had any elevated readings recently. Patient denies side effects from his medication. States taking it regularly. Patient also has been taking the ferrous fumarate. She denies any melena. She is not having any abdominal discomfort. Stools are normal. No constipation or nausea from the medication area in today for recheck of her CBC. She is planning right knee replacement this will be her fifth surgery on that right knee. Surgeries to be done at [facility] in 2-3 weeks. She needs to be cleared for surgery.  History Beth [name] has a past medical history of Hypertension; CVA (cerebral infarction); Hyperlipidemia; Blood transfusion without reported diagnosis (2012); Heart murmur; Nocturia; Gout; Sleep apnea; Arthritis; Anemia; Herpes infection (08-09-14); Complication of anesthesia (06-21-14); Family history of anesthesia complication; and Stroke (2006).   She has past surgical history that includes Carpal tunnel release (Right, 1983); Abdominal hysterectomy (1983); colonscopy (June 21, 2014); nasal cauterization (2012); Total knee arthroplasty (Right, 07/03/2014); Patellar tendon repair (Right, 08/11/2014); Joint replacement (06/2014); I&D knee with poly exchange (Right, 10/02/2014); and Excisional total knee arthroplasty with antibiotic spacers (Right, 02/16/2015).   Her family history includes Brain cancer in her mother; Diabetes in her son and son; Ovarian cancer in her mother. There is no history of Colon cancer, Esophageal cancer, Stomach cancer, or Rectal cancer.She reports that she has never smoked. She has never used  smokeless tobacco. She reports that she does not drink alcohol or use illicit drugs.  Current Outpatient Prescriptions on File Prior to Visit  Medication Sig Dispense Refill  . amLODipine (NORVASC) 10 MG tablet Take 1 tablet (10 mg total) by mouth every evening. 90 tablet 4  . atorvastatin (LIPITOR) 20 MG tablet Take 1 tablet (20 mg total) by mouth every evening. 90 tablet 4  . cyclobenzaprine (FLEXERIL) 10 MG tablet Take 10 mg by mouth 3 (three) times daily as needed.     . ferrous fumarate (HEMOCYTE) 325 (106 FE) MG TABS tablet Take 1 tablet (106 mg of iron total) by mouth 2 (two) times daily. 60 each 2  . olmesartan-hydrochlorothiazide (BENICAR HCT) 40-12.5 MG per tablet Take 1 tablet by mouth every evening. 90 tablet 4  . oxyCODONE (OXY IR/ROXICODONE) 5 MG immediate release tablet Take 1-2 tablets (5-10 mg total) by mouth every 3 (three) hours as needed for moderate pain, severe pain or breakthrough pain. 90 tablet 0  . sodium chloride (OCEAN) 0.65 % SOLN nasal spray Place 1 spray into both nostrils 2 (two) times daily as needed for congestion.    . sodium chloride 0.9 % infusion     . traMADol (ULTRAM) 50 MG tablet Take 1-2 tablets (50-100 mg total) by mouth every 6 (six) hours as needed (mild pain). 80 tablet 1   Current Facility-Administered Medications on File Prior to Visit  Medication Dose Route Frequency Provider Last Rate Last Dose  . tranexamic acid (CYKLOKAPRON) 2,000 mg in sodium chloride 0.9 % 50 mL Topical Application  5,956 mg Topical Once [name], MD        ROS Review of Systems  Constitutional: Positive for fatigue. Negative for fever, chills, diaphoresis,  appetite change and unexpected weight change.  HENT: Negative for congestion, ear pain, hearing loss, postnasal drip, rhinorrhea, sneezing, sore throat and trouble swallowing.   Eyes: Negative for pain.  Respiratory: Negative for cough, chest tightness and shortness of breath.   Cardiovascular: Negative for chest  pain and palpitations.  Gastrointestinal: Negative for nausea, vomiting, abdominal pain, diarrhea and constipation.  Genitourinary: Negative for dysuria, frequency and menstrual problem.  Musculoskeletal: Positive for myalgias (right thigh and leg), joint swelling (right knee) and arthralgias (she walks around at home but when she's out she uses a wheelchair because of the pain in the right knee. Surgery for knee replacement is planned for 2-3 weeks from now.).  Skin: Negative for rash.  Neurological: Negative for dizziness, weakness, numbness and headaches.  Psychiatric/Behavioral: Negative for dysphoric mood and agitation.    Objective:  BP 149/81 mmHg  Pulse 73  Temp(Src) 98.5 F (36.9 C) (Oral)  Ht 5\' 5"  (1.651 m)  Wt 209 lb (94.802 kg)  BMI 34.78 kg/m2  BP Readings from Last 3 Encounters:  04/18/15 149/81  03/19/15 155/72  10/20/14 122/79    Wt Readings from Last 3 Encounters:  04/18/15 209 lb (94.802 kg)  03/19/15 209 lb (94.802 kg)  10/20/14 221 lb (100.245 kg)     Physical Exam  Constitutional: She is oriented to person, place, and time. She appears well-developed and well-nourished. No distress.  HENT:  Head: Normocephalic and atraumatic.  Right Ear: External ear normal.  Left Ear: External ear normal.  Nose: Nose normal.  Mouth/Throat: Oropharynx is clear and moist.  Eyes: Conjunctivae and EOM are normal. Pupils are equal, round, and reactive to light.  Neck: Normal range of motion. Neck supple. No thyromegaly present.  Cardiovascular: Normal rate, regular rhythm and normal heart sounds.   No murmur heard. Pulmonary/Chest: Effort normal and breath sounds normal. No respiratory distress. She has no wheezes. She has no rales.  Abdominal: Soft. Bowel sounds are normal. She exhibits no distension. There is no tenderness.  Musculoskeletal: She exhibits edema and tenderness.  Decreased range of motion at the right lower extremity due to pain in the knee. She is  currently wearing a right knee immobilizer. She is wheelchair bound. However she states that she is able to walk at homerr  Lymphadenopathy:    She has no cervical adenopathy.  Neurological: She is alert and oriented to person, place, and time. She has normal reflexes.  Skin: Skin is warm and dry.  Psychiatric: She has a normal mood and affect. Her behavior is normal. Judgment and thought content normal.    Lab Results  Component Value Date   HGBA1C 6.1 06/21/2013      Ir Patient Eval Tech 0-60 Mins  03/07/2015   Garvin Fila [facility]     03/07/2015 12:47 PM Patient came in today with complaint of long infusion time  yesterday.  She gets vancomycin at home and it took 8 hours to  infuse by gravity.  I inspected the catheter.  The site look good  and no kinks evident.  I did exchange the cap for one of our  spring claves and flushed the catheter with multiple 3 cc  syringes of normal saline.  I was not able to aspirate but it did  appear to flush easier. Since the patient would prefer not to  exchange the catheter, I advised her to try her medication  administration tonight and see if the flushing helped.  She will  call tomorrow if she  is still having issues and will decide  overnight if she would like [place] to exchange the picc at that time.     Assessment & Plan:   Beth [name] was seen today for anemia.  Diagnoses and all orders for this visit:  Anemia, iron deficiency Orders: -     Cancel: POCT CBC -     CBC with Differential/Platelet  Essential hypertension  Preoperative clearance  HLD (hyperlipidemia)  Other orders -     nebivolol 20 MG TABS; Take 1 tablet (20 mg total) by mouth every evening.   I have discontinued Ms. Muller's Vancomycin. I have also changed her nebivolol to Nebivolol HCl. Additionally, I am having her maintain her sodium chloride, oxyCODONE, traMADol, cyclobenzaprine, sodium chloride, amLODipine, atorvastatin, olmesartan-hydrochlorothiazide, and ferrous fumarate.  Meds  ordered this encounter  Medications  . nebivolol 20 MG TABS    Sig: Take 1 tablet (20 mg total) by mouth every evening.    Dispense:  30 tablet    Refill:  5     Follow-up: Return in about 6 weeks (around 05/30/2015).  Claretta Fraise, M.D.

## 2015-04-19 LAB — CBC WITH DIFFERENTIAL/PLATELET
BASOS ABS: 0.1 10*3/uL (ref 0.0–0.2)
Basos: 1 %
EOS (ABSOLUTE): 0.2 10*3/uL (ref 0.0–0.4)
Eos: 2 %
HEMOGLOBIN: 10.1 g/dL — AB (ref 11.1–15.9)
Hematocrit: 33.5 % — ABNORMAL LOW (ref 34.0–46.6)
IMMATURE GRANULOCYTES: 0 %
Immature Grans (Abs): 0 10*3/uL (ref 0.0–0.1)
LYMPHS: 17 %
Lymphocytes Absolute: 1.5 10*3/uL (ref 0.7–3.1)
MCH: 22.5 pg — AB (ref 26.6–33.0)
MCHC: 30.1 g/dL — AB (ref 31.5–35.7)
MCV: 75 fL — ABNORMAL LOW (ref 79–97)
Monocytes Absolute: 0.8 10*3/uL (ref 0.1–0.9)
Monocytes: 9 %
NEUTROS PCT: 71 %
Neutrophils Absolute: 6.4 10*3/uL (ref 1.4–7.0)
PLATELETS: 467 10*3/uL — AB (ref 150–379)
RBC: 4.49 x10E6/uL (ref 3.77–5.28)
RDW: 16.6 % — AB (ref 12.3–15.4)
WBC: 8.9 10*3/uL (ref 3.4–10.8)

## 2015-04-20 ENCOUNTER — Telehealth: Payer: Self-pay | Admitting: Family Medicine

## 2015-04-23 NOTE — Telephone Encounter (Signed)
Patient aware of results.

## 2015-04-27 NOTE — Patient Instructions (Addendum)
Beth [name]  04/27/2015   Your procedure is scheduled on:      05/04/2015    Report to [facility] [place] Main  Entrance and follow signs to               [place] at   130pm  Call this number if you have problems the morning of surgery [phone]   Remember: ONLY 1 PERSON MAY GO WITH YOU TO SHORT STAY TO GET  READY MORNING OF YOUR SURGERY.  Do not eat food  After midnite. May have clear liquids from 12 midnite until 0830am morning of surgery then nothing by mouth.      Take these medicines the morning of surgery with A SIP OF WATER: oxycodone if needed                                You may not have any metal on your body including hair pins and              piercings  Do not wear jewelry, make-up, lotions, powders or perfumes, deodorant             Do not wear nail polish.  Do not shave  48 hours prior to surgery.     Do not bring valuables to the hospital. [place].  Contacts, dentures or bridgework may not be worn into surgery.  Leave suitcase in the car. After surgery it may be brought to your room.       Special Instructions: coughing and deep breathing exercises, leg exercises               Please read over the following fact sheets you were given: _____________________________________________________________________             [facility] - Preparing for Surgery Before surgery, you can play an important role.  Because skin is not sterile, your skin needs to be as free of germs as possible.  You can reduce the number of germs on your skin by washing with CHG (chlorahexidine gluconate) soap before surgery.  CHG is an antiseptic cleaner which kills germs and bonds with the skin to continue killing germs even after washing. Please DO NOT use if you have an allergy to CHG or antibacterial soaps.  If your skin becomes reddened/irritated stop using the CHG and inform your nurse when you arrive at Short  Stay. Do not shave (including legs and underarms) for at least 48 hours prior to the first CHG shower.  You may shave your face/neck. Please follow these instructions carefully:  1.  Shower with CHG Soap the night before surgery and the  morning of Surgery.  2.  If you choose to wash your hair, wash your hair first as usual with your  normal  shampoo.  3.  After you shampoo, rinse your hair and body thoroughly to remove the  shampoo.                           4.  Use CHG as you would any other liquid soap.  You can apply chg directly  to the skin and wash  Gently with a scrungie or clean washcloth.  5.  Apply the CHG Soap to your body ONLY FROM THE NECK DOWN.   Do not use on face/ open                           Wound or open sores. Avoid contact with eyes, ears mouth and genitals (private parts).                       Wash face,  Genitals (private parts) with your normal soap.             6.  Wash thoroughly, paying special attention to the area where your surgery  will be performed.  7.  Thoroughly rinse your body with warm water from the neck down.  8.  DO NOT shower/wash with your normal soap after using and rinsing off  the CHG Soap.                9.  Pat yourself dry with a clean towel.            10.  Wear clean pajamas.            11.  Place clean sheets on your bed the night of your first shower and do not  sleep with pets. Day of Surgery : Do not apply any lotions/deodorants the morning of surgery.  Please wear clean clothes to the hospital/surgery center.  FAILURE TO FOLLOW THESE INSTRUCTIONS MAY RESULT IN THE CANCELLATION OF YOUR SURGERY PATIENT SIGNATURE_________________________________  NURSE SIGNATURE__________________________________  ________________________________________________________________________  WHAT IS A BLOOD TRANSFUSION? Blood Transfusion Information  A transfusion is the replacement of blood or some of its parts. Blood is made up of  multiple cells which provide different functions.  Red blood cells carry oxygen and are used for blood loss replacement.  White blood cells fight against infection.  Platelets control bleeding.  Plasma helps clot blood.  Other blood products are available for specialized needs, such as hemophilia or other clotting disorders. BEFORE THE TRANSFUSION  Who gives blood for transfusions?   Healthy volunteers who are fully evaluated to make sure their blood is safe. This is blood bank blood. Transfusion therapy is the safest it has ever been in the practice of medicine. Before blood is taken from a donor, a complete history is taken to make sure that person has no history of diseases nor engages in risky social behavior (examples are intravenous drug use or sexual activity with multiple partners). The donor's travel history is screened to minimize risk of transmitting infections, such as malaria. The donated blood is tested for signs of infectious diseases, such as HIV and hepatitis. The blood is then tested to be sure it is compatible with you in order to minimize the chance of a transfusion reaction. If you or a relative donates blood, this is often done in anticipation of surgery and is not appropriate for emergency situations. It takes many days to process the donated blood. RISKS AND COMPLICATIONS Although transfusion therapy is very safe and saves many lives, the main dangers of transfusion include:  1. Getting an infectious disease. 2. Developing a transfusion reaction. This is an allergic reaction to something in the blood you were given. Every precaution is taken to prevent this. The decision to have a blood transfusion has been considered carefully by your caregiver before blood is given. Blood is not given unless the benefits outweigh  the risks. AFTER THE TRANSFUSION  Right after receiving a blood transfusion, you will usually feel much better and more energetic. This is especially true if  your red blood cells have gotten low (anemic). The transfusion raises the level of the red blood cells which carry oxygen, and this usually causes an energy increase.  The nurse administering the transfusion will monitor you carefully for complications. HOME CARE INSTRUCTIONS  No special instructions are needed after a transfusion. You may find your energy is better. Speak with your caregiver about any limitations on activity for underlying diseases you may have. SEEK MEDICAL CARE IF:   Your condition is not improving after your transfusion.  You develop redness or irritation at the intravenous (IV) site. SEEK IMMEDIATE MEDICAL CARE IF:  Any of the following symptoms occur over the next 12 hours:  Shaking chills.  You have a temperature by mouth above 102 F (38.9 C), not controlled by medicine.  Chest, back, or muscle pain.  People around you feel you are not acting correctly or are confused.  Shortness of breath or difficulty breathing.  Dizziness and fainting.  You get a rash or develop hives.  You have a decrease in urine output.  Your urine turns a dark color or changes to pink, red, or brown. Any of the following symptoms occur over the next 10 days:  You have a temperature by mouth above 102 F (38.9 C), not controlled by medicine.  Shortness of breath.  Weakness after normal activity.  The white part of the eye turns yellow (jaundice).  You have a decrease in the amount of urine or are urinating less often.  Your urine turns a dark color or changes to pink, red, or brown. Document Released: 11/21/2000 Document Revised: 02/16/2012 Document Reviewed: 07/10/2008 ExitCare Patient Information 2014 [place].  _______________________________________________________________________  Incentive Spirometer  An incentive spirometer is a tool that can help keep your lungs clear and active. This tool measures how well you are filling your lungs with each breath.  Taking long deep breaths may help reverse or decrease the chance of developing breathing (pulmonary) problems (especially infection) following:  A long period of time when you are unable to move or be active. BEFORE THE PROCEDURE   If the spirometer includes an indicator to show your best effort, your nurse or respiratory therapist will set it to a desired goal.  If possible, sit up straight or lean slightly forward. Try not to slouch.  Hold the incentive spirometer in an upright position. INSTRUCTIONS FOR USE  3. Sit on the edge of your bed if possible, or sit up as far as you can in bed or on a chair. 4. Hold the incentive spirometer in an upright position. 5. Breathe out normally. 6. Place the mouthpiece in your mouth and seal your lips tightly around it. 7. Breathe in slowly and as deeply as possible, raising the piston or the ball toward the top of the column. 8. Hold your breath for 3-5 seconds or for as long as possible. Allow the piston or ball to fall to the bottom of the column. 9. Remove the mouthpiece from your mouth and breathe out normally. 10. Rest for a few seconds and repeat Steps 1 through 7 at least 10 times every 1-2 hours when you are awake. Take your time and take a few normal breaths between deep breaths. 11. The spirometer may include an indicator to show your best effort. Use the indicator as a goal to work  toward during each repetition. 12. After each set of 10 deep breaths, practice coughing to be sure your lungs are clear. If you have an incision (the cut made at the time of surgery), support your incision when coughing by placing a pillow or rolled up towels firmly against it. Once you are able to get out of bed, walk around indoors and cough well. You may stop using the incentive spirometer when instructed by your caregiver.  RISKS AND COMPLICATIONS  Take your time so you do not get dizzy or light-headed.  If you are in pain, you may need to take or ask for  pain medication before doing incentive spirometry. It is harder to take a deep breath if you are having pain. AFTER USE  Rest and breathe slowly and easily.  It can be helpful to keep track of a log of your progress. Your caregiver can provide you with a simple table to help with this. If you are using the spirometer at home, follow these instructions: [place] IF:   You are having difficultly using the spirometer.  You have trouble using the spirometer as often as instructed.  Your pain medication is not giving enough relief while using the spirometer.  You develop fever of 100.5 F (38.1 C) or higher. SEEK IMMEDIATE MEDICAL CARE IF:   You cough up bloody sputum that had not been present before.  You develop fever of 102 F (38.9 C) or greater.  You develop worsening pain at or near the incision site. MAKE SURE YOU:   Understand these instructions.  Will watch your condition.  Will get help right away if you are not doing well or get worse. Document Released: 04/06/2007 Document Revised: 02/16/2012 Document Reviewed: 06/07/2007 ExitCare Patient Information 2014 ExitCare, [place].   ________________________________________________________________________    CLEAR LIQUID DIET   Foods Allowed                                                                     Foods Excluded  Coffee and tea, regular and decaf                             liquids that you cannot  Plain Jell-O in any flavor                                             see through such as: Fruit ices (not with fruit pulp)                                     milk, soups, orange juice  Iced Popsicles                                    All solid food Carbonated beverages, regular and diet  Cranberry, grape and apple juices Sports drinks like Gatorade Lightly seasoned clear broth or consume(fat free) Sugar, honey syrup  Sample Menu Breakfast                                 Lunch                                     Supper Cranberry juice                    Beef broth                            Chicken broth Jell-O                                     Grape juice                           Apple juice Coffee or tea                        Jell-O                                      Popsicle                                                Coffee or tea                        Coffee or tea  _____________________________________________________________________

## 2015-04-30 ENCOUNTER — Encounter (HOSPITAL_COMMUNITY)
Admission: RE | Admit: 2015-04-30 | Discharge: 2015-04-30 | Disposition: A | Discharge status: Home / Self Care | Payer: Medicare Other | Source: Ambulatory Visit | Attending: Orthopedic Surgery | Admitting: Orthopedic Surgery

## 2015-04-30 ENCOUNTER — Encounter (HOSPITAL_COMMUNITY): Payer: Self-pay

## 2015-04-30 DIAGNOSIS — Z01812 Encounter for preprocedural laboratory examination: Secondary | ICD-10-CM | POA: Diagnosis not present

## 2015-04-30 DIAGNOSIS — M179 Osteoarthritis of knee, unspecified: Secondary | ICD-10-CM | POA: Diagnosis not present

## 2015-04-30 LAB — COMPREHENSIVE METABOLIC PANEL
ALBUMIN: 3.4 g/dL — AB (ref 3.5–5.0)
ALT: 31 U/L (ref 14–54)
AST: 27 U/L (ref 15–41)
Alkaline Phosphatase: 126 U/L (ref 38–126)
Anion gap: 8 (ref 5–15)
BUN: 18 mg/dL (ref 6–20)
CO2: 29 mmol/L (ref 22–32)
CREATININE: 1 mg/dL (ref 0.44–1.00)
Calcium: 9.3 mg/dL (ref 8.9–10.3)
Chloride: 99 mmol/L — ABNORMAL LOW (ref 101–111)
GFR calc non Af Amer: 55 mL/min — ABNORMAL LOW (ref >60)
Glucose, Bld: 152 mg/dL — ABNORMAL HIGH (ref 65–99)
Potassium: 3.9 mmol/L (ref 3.5–5.1)
Sodium: 136 mmol/L (ref 135–145)
Total Bilirubin: 0.6 mg/dL (ref 0.3–1.2)
Total Protein: 8.3 g/dL — ABNORMAL HIGH (ref 6.5–8.1)

## 2015-04-30 LAB — CBC
HCT: 35.5 % — ABNORMAL LOW (ref 36.0–46.0)
Hemoglobin: 10.5 g/dL — ABNORMAL LOW (ref 12.0–15.0)
MCH: 22 pg — ABNORMAL LOW (ref 26.0–34.0)
MCHC: 29.6 g/dL — AB (ref 30.0–36.0)
MCV: 74.3 fL — ABNORMAL LOW (ref 78.0–100.0)
PLATELETS: 402 10*3/uL — AB (ref 150–400)
RBC: 4.78 MIL/uL (ref 3.87–5.11)
RDW: 16.7 % — AB (ref 11.5–15.5)
WBC: 8.1 10*3/uL (ref 4.0–10.5)

## 2015-04-30 LAB — PROTIME-INR
INR: 1.16 (ref 0.00–1.49)
Prothrombin Time: 14.9 seconds (ref 11.6–15.2)

## 2015-04-30 LAB — URINALYSIS, ROUTINE W REFLEX MICROSCOPIC
BILIRUBIN URINE: NEGATIVE
GLUCOSE, UA: NEGATIVE mg/dL
Hgb urine dipstick: NEGATIVE
Ketones, ur: NEGATIVE mg/dL
LEUKOCYTES UA: NEGATIVE
NITRITE: NEGATIVE
Protein, ur: NEGATIVE mg/dL
Specific Gravity, Urine: 1.016 (ref 1.005–1.030)
UROBILINOGEN UA: 1 mg/dL (ref 0.0–1.0)
pH: 6.5 (ref 5.0–8.0)

## 2015-04-30 LAB — SURGICAL PCR SCREEN
MRSA, PCR: NEGATIVE
STAPHYLOCOCCUS AUREUS: NEGATIVE

## 2015-04-30 LAB — APTT: aPTT: 31 seconds (ref 24–37)

## 2015-04-30 NOTE — Progress Notes (Signed)
EKG- 05/31/14 EPIC  LOV with Dr Percival Spanish - 05/31/14 EPIC  04/18/15 preop clearance visit with PCP in EPIC

## 2015-04-30 NOTE — Progress Notes (Signed)
CBC done 04/30/15 faxed via EPIC to Dr Wynelle Link.

## 2015-05-04 LAB — TYPE AND SCREEN
ABO/RH(D): O POS
ANTIBODY SCREEN: NEGATIVE

## 2015-05-08 ENCOUNTER — Ambulatory Visit: Payer: Self-pay | Admitting: Orthopedic Surgery

## 2015-05-08 NOTE — Progress Notes (Signed)
Preoperative surgical orders have been place into the Epic hospital system for Beth [name] on 05/08/2015, 5:11 PM  by Mickel Crow for surgery on 05-23-2015.  Preop Knee orders including Experal, IV Tylenol, and IV Decadron as long as there are no contraindications to the above medications. [name], PA-C

## 2015-05-18 ENCOUNTER — Ambulatory Visit: Payer: Self-pay | Admitting: Orthopedic Surgery

## 2015-05-18 NOTE — H&P (Signed)
Beth [name]. Beth [name] DOB: Apr 26, 1946 Single / Language: Cleophus Molt / Race: White Female Date of Admission:  05/23/2015 CC:  Resection of right total knee History of Present Illness The patient is a 69 year old female who comes in today for a preoperative History and Physical. The patient is scheduled for a right total knee reimplantation to be performed by Dr. Dione Plover. Aluisio, MD at [facility] on 05/23/2015. The patient is a 69 year old female presenting 3 months postop following resection arthroplasty with placement of antibiotic spacer. Overall the patient feels that the right knee is doing well. The patient does report pain and swelling The patient does indicate that these symptoms are improving. Pain medications include: Oxycodone (and Robaxin prn) . The patient is currently with the assistance of: wheelchair. The patient is using a straight leg immobilizer.  Her Sed Rate and C-reactive protein have been improving.  She is now ready for reimplantation surgery.  Risks and benefits of the surgery have been discussed with the patient and they elect to proceed with surgery.  There are on active contraindications to upcoming procedure such as progressive neurological disease.   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  Family History  Heart Disease child Hypertension mother, sister, brother and child Cancer mother Congestive Heart Failure First Degree Relatives. father and child Diabetes Mellitus child Kidney  disease child  Social History  Drug/Alcohol Rehab (Currently) no Pain Contract no Alcohol use never consumed alcohol Drug/Alcohol Rehab (Previously) no Children 5 or more Number of flights of stairs before winded less than 1 Marital status single Living situation live alone Illicit drug use no Tobacco use Never smoker. never smoker Exercise Exercises daily; does other Current work status retired  Medication History  Bystolic (10MG  Tablet, Oral) Active. Benicar HCT (40-25MG  Tablet, Oral) Active. Atorvastatin Calcium (20MG  Tablet, Oral) Active. AmLODIPine Besylate (10MG  Tablet, Oral) Active.  Past Surgical History Colon Polyp Removal - Colonoscopy Carpal Tunnel Repair right Hysterectomy complete (non-cancerous) Tubal Ligation  Review of Systems  General Not Present- Chills, Fatigue, Fever, Memory Loss, Night Sweats, Weight Gain and Weight Loss. Skin Not Present- Eczema, Hives, Itching, Lesions and Rash. HEENT Not Present- Dentures, Double Vision, Headache, Hearing Loss, Tinnitus and Visual Loss. Respiratory Not Present- Allergies, Chronic Cough, Coughing up blood, Shortness of breath at rest and Shortness of breath with exertion. Cardiovascular Not Present- Chest Pain, Difficulty Breathing Lying Down, Murmur, Palpitations, Racing/skipping heartbeats and Swelling. Gastrointestinal Not Present- Abdominal Pain, Bloody Stool, Constipation, Diarrhea, Difficulty Swallowing, Heartburn, Jaundice, Loss of appetitie, Nausea and Vomiting. Female Genitourinary Not Present- Blood in Urine, Discharge, Flank Pain, Incontinence, Painful Urination, Urgency, Urinary frequency, Urinary Retention, Urinating at Night and Weak urinary stream. Musculoskeletal Present- Joint Pain and Joint Swelling. Not Present- Back Pain, Morning Stiffness, Muscle Pain, Muscle Weakness and Spasms. Neurological Not Present- Blackout spells, Difficulty with balance, Dizziness, Paralysis, Tremor and  Weakness. Psychiatric Not Present- Insomnia.  Vitals Ht 63 inches BP: 158/98 (Sitting, Left Arm, Standard)  Physical Exam General Mental Status -Alert, cooperative and good historian. General Appearance-pleasant, Not in acute distress. Orientation-Oriented X3. Build & Nutrition-Well nourished and Well developed.  Head and Neck Head-normocephalic,  atraumatic . Neck Global Assessment - supple, no bruit auscultated on the right, no bruit auscultated on the left.  Eye Vision-Wears corrective lenses. Pupil - Bilateral-Regular and Round. Motion - Bilateral-EOMI.  Chest and Lung Exam Auscultation Breath sounds - clear at anterior chest wall and clear at posterior chest wall. Adventitious sounds - No Adventitious sounds.  Cardiovascular Auscultation Rhythm - Regular rate and rhythm. Heart Sounds - S1 WNL and S2 WNL. Murmurs & Other Heart Sounds: Murmur 1 - Location - Pulmonic Area and Sternal Border - Left. Grade - II/VI.  Abdomen Palpation/Percussion Tenderness - Abdomen is non-tender to palpation. Rigidity (guarding) - Abdomen is soft. Auscultation Auscultation of the abdomen reveals - Bowel sounds normal.  Female Genitourinary Note: Not done, not pertinent to present illness   Musculoskeletal Note: On exam, she is in no distress. Her right knee wound looks better, no drainage. There is no surrounding erythema. Knee immobilizer in place   Assessment & Plan  Postoperative infection of knee, subsequent encounter (T81.4XXD) Note:Surgical Plans: Right Total Knee Reimplantation  Disposition: Rehab Facility  PCP: Dr. Livia Snellen  Topical TXA  Anesthesia Issues: None  Signed electronically by Joelene Millin, III PA-C

## 2015-05-23 ENCOUNTER — Inpatient Hospital Stay (HOSPITAL_COMMUNITY)
Admission: RE | Admit: 2015-05-23 | Discharge: 2015-05-26 | DRG: 470 | Disposition: A | Discharge status: Skilled Nursing Facility | Payer: Medicare Other | Source: Ambulatory Visit | Attending: Orthopedic Surgery | Admitting: Orthopedic Surgery

## 2015-05-23 ENCOUNTER — Inpatient Hospital Stay (HOSPITAL_COMMUNITY): Payer: Medicare Other | Admitting: Certified Registered Nurse Anesthetist

## 2015-05-23 ENCOUNTER — Encounter (HOSPITAL_COMMUNITY)
Admission: RE | Disposition: A | Discharge status: Skilled Nursing Facility | Payer: Self-pay | Source: Ambulatory Visit | Attending: Orthopedic Surgery

## 2015-05-23 ENCOUNTER — Encounter (HOSPITAL_COMMUNITY): Payer: Self-pay | Admitting: *Deleted

## 2015-05-23 DIAGNOSIS — Z01812 Encounter for preprocedural laboratory examination: Secondary | ICD-10-CM

## 2015-05-23 DIAGNOSIS — M21861 Other specified acquired deformities of right lower leg: Secondary | ICD-10-CM | POA: Diagnosis present

## 2015-05-23 DIAGNOSIS — E440 Moderate protein-calorie malnutrition: Secondary | ICD-10-CM | POA: Diagnosis present

## 2015-05-23 DIAGNOSIS — I10 Essential (primary) hypertension: Secondary | ICD-10-CM | POA: Diagnosis present

## 2015-05-23 DIAGNOSIS — E785 Hyperlipidemia, unspecified: Secondary | ICD-10-CM | POA: Diagnosis present

## 2015-05-23 DIAGNOSIS — D62 Acute posthemorrhagic anemia: Secondary | ICD-10-CM | POA: Diagnosis not present

## 2015-05-23 DIAGNOSIS — M1710 Unilateral primary osteoarthritis, unspecified knee: Secondary | ICD-10-CM | POA: Diagnosis present

## 2015-05-23 DIAGNOSIS — Z8673 Personal history of transient ischemic attack (TIA), and cerebral infarction without residual deficits: Secondary | ICD-10-CM

## 2015-05-23 DIAGNOSIS — Z6831 Body mass index (BMI) 31.0-31.9, adult: Secondary | ICD-10-CM

## 2015-05-23 DIAGNOSIS — M179 Osteoarthritis of knee, unspecified: Secondary | ICD-10-CM | POA: Diagnosis present

## 2015-05-23 HISTORY — PX: REIMPLANTATION OF TOTAL KNEE: SHX6052

## 2015-05-23 LAB — GRAM STAIN

## 2015-05-23 LAB — PREPARE RBC (CROSSMATCH)

## 2015-05-23 SURGERY — REVISION, TOTAL ARTHROPLASTY, KNEE
Anesthesia: General | Site: Knee | Laterality: Right

## 2015-05-23 MED ORDER — HYDROMORPHONE HCL 1 MG/ML IJ SOLN
INTRAMUSCULAR | Status: AC
  Filled 2015-05-23: qty 1

## 2015-05-23 MED ORDER — ACETAMINOPHEN 10 MG/ML IV SOLN
1000.0000 mg | Freq: Once | INTRAVENOUS | Status: AC
  Administered 2015-05-23: 1000 mg via INTRAVENOUS
  Filled 2015-05-23: qty 100

## 2015-05-23 MED ORDER — FENTANYL CITRATE (PF) 100 MCG/2ML IJ SOLN
INTRAMUSCULAR | Status: AC
  Filled 2015-05-23: qty 2

## 2015-05-23 MED ORDER — HYDROCHLOROTHIAZIDE 12.5 MG PO CAPS
12.5000 mg | ORAL_CAPSULE | Freq: Every evening | ORAL | Status: DC
  Administered 2015-05-23 – 2015-05-25 (×3): 12.5 mg via ORAL
  Filled 2015-05-23 (×4): qty 1

## 2015-05-23 MED ORDER — FLEET ENEMA 7-19 GM/118ML RE ENEM: 1.0000 | ENEMA | Freq: Once | RECTAL | Status: AC | PRN

## 2015-05-23 MED ORDER — PROPOFOL 10 MG/ML IV BOLUS
INTRAVENOUS | Status: AC
  Filled 2015-05-23: qty 20

## 2015-05-23 MED ORDER — CEFAZOLIN SODIUM-DEXTROSE 2-3 GM-% IV SOLR
2.0000 g | Freq: Four times a day (QID) | INTRAVENOUS | Status: AC
  Administered 2015-05-23 – 2015-05-24 (×2): 2 g via INTRAVENOUS
  Filled 2015-05-23 (×2): qty 50

## 2015-05-23 MED ORDER — DOCUSATE SODIUM 100 MG PO CAPS
100.0000 mg | ORAL_CAPSULE | Freq: Two times a day (BID) | ORAL | Status: DC
  Administered 2015-05-23 – 2015-05-26 (×6): 100 mg via ORAL

## 2015-05-23 MED ORDER — METOCLOPRAMIDE HCL 10 MG PO TABS: 5.0000 mg | ORAL_TABLET | Freq: Three times a day (TID) | ORAL | Status: DC | PRN

## 2015-05-23 MED ORDER — AMLODIPINE BESYLATE 10 MG PO TABS
10.0000 mg | ORAL_TABLET | Freq: Every evening | ORAL | Status: DC
  Administered 2015-05-23 – 2015-05-25 (×3): 10 mg via ORAL
  Filled 2015-05-23 (×4): qty 1

## 2015-05-23 MED ORDER — ROCURONIUM BROMIDE 100 MG/10ML IV SOLN
INTRAVENOUS | Status: AC
  Filled 2015-05-23: qty 1

## 2015-05-23 MED ORDER — SODIUM CHLORIDE 0.9 % IR SOLN
Status: DC | PRN
  Administered 2015-05-23: 3000 mL

## 2015-05-23 MED ORDER — PHENOL 1.4 % MT LIQD
1.0000 | OROMUCOSAL | Status: DC | PRN
  Filled 2015-05-23: qty 177

## 2015-05-23 MED ORDER — DEXAMETHASONE SODIUM PHOSPHATE 10 MG/ML IJ SOLN
INTRAMUSCULAR | Status: DC | PRN
  Administered 2015-05-23: 10 mg via INTRAVENOUS

## 2015-05-23 MED ORDER — SODIUM CHLORIDE 0.9 % IJ SOLN
INTRAMUSCULAR | Status: AC
  Filled 2015-05-23: qty 50

## 2015-05-23 MED ORDER — ONDANSETRON HCL 4 MG/2ML IJ SOLN: 4.0000 mg | Freq: Four times a day (QID) | INTRAMUSCULAR | Status: DC | PRN

## 2015-05-23 MED ORDER — BISACODYL 10 MG RE SUPP: 10.0000 mg | Freq: Every day | RECTAL | Status: DC | PRN

## 2015-05-23 MED ORDER — SODIUM CHLORIDE 0.9 % IV SOLN
INTRAVENOUS | Status: DC
Start: 2015-05-23 — End: 2015-05-23
  Administered 2015-05-23: 17:00:00 via INTRAVENOUS

## 2015-05-23 MED ORDER — LIDOCAINE HCL (CARDIAC) 20 MG/ML IV SOLN
INTRAVENOUS | Status: DC | PRN
  Administered 2015-05-23: 100 mg via INTRAVENOUS

## 2015-05-23 MED ORDER — PROMETHAZINE HCL 25 MG/ML IJ SOLN
INTRAMUSCULAR | Status: AC
  Filled 2015-05-23: qty 1

## 2015-05-23 MED ORDER — FENTANYL CITRATE (PF) 250 MCG/5ML IJ SOLN
INTRAMUSCULAR | Status: AC
  Filled 2015-05-23: qty 5

## 2015-05-23 MED ORDER — FERROUS FUMARATE 325 (106 FE) MG PO TABS
1.0000 | ORAL_TABLET | Freq: Two times a day (BID) | ORAL | Status: DC
  Administered 2015-05-24 – 2015-05-26 (×5): 106 mg via ORAL
  Filled 2015-05-23 (×7): qty 1

## 2015-05-23 MED ORDER — SUCCINYLCHOLINE CHLORIDE 20 MG/ML IJ SOLN
INTRAMUSCULAR | Status: DC | PRN
  Administered 2015-05-23: 100 mg via INTRAVENOUS

## 2015-05-23 MED ORDER — ONDANSETRON HCL 4 MG/2ML IJ SOLN
INTRAMUSCULAR | Status: AC
  Filled 2015-05-23: qty 2

## 2015-05-23 MED ORDER — ONDANSETRON HCL 4 MG/2ML IJ SOLN
INTRAMUSCULAR | Status: DC | PRN
  Administered 2015-05-23: 4 mg via INTRAVENOUS

## 2015-05-23 MED ORDER — NEBIVOLOL HCL 10 MG PO TABS
20.0000 mg | ORAL_TABLET | Freq: Every day | ORAL | Status: DC
  Administered 2015-05-23 – 2015-05-25 (×3): 20 mg via ORAL
  Filled 2015-05-23 (×4): qty 2

## 2015-05-23 MED ORDER — SODIUM CHLORIDE 0.9 % IV SOLN
2000.0000 mg | Freq: Once | INTRAVENOUS | Status: DC
  Filled 2015-05-23: qty 20

## 2015-05-23 MED ORDER — MENTHOL 3 MG MT LOZG
1.0000 | LOZENGE | OROMUCOSAL | Status: DC | PRN
  Filled 2015-05-23: qty 9

## 2015-05-23 MED ORDER — ACETAMINOPHEN 500 MG PO TABS
1000.0000 mg | ORAL_TABLET | Freq: Four times a day (QID) | ORAL | Status: AC
  Administered 2015-05-23 – 2015-05-24 (×3): 1000 mg via ORAL
  Filled 2015-05-23 (×4): qty 2

## 2015-05-23 MED ORDER — ACETAMINOPHEN 325 MG PO TABS: 650.0000 mg | ORAL_TABLET | Freq: Four times a day (QID) | ORAL | Status: DC | PRN

## 2015-05-23 MED ORDER — OXYCODONE HCL 5 MG PO TABS
5.0000 mg | ORAL_TABLET | ORAL | Status: DC | PRN
  Administered 2015-05-23 – 2015-05-26 (×11): 10 mg via ORAL
  Filled 2015-05-23 (×11): qty 2

## 2015-05-23 MED ORDER — PROMETHAZINE HCL 25 MG/ML IJ SOLN
6.2500 mg | INTRAMUSCULAR | Status: DC | PRN
  Administered 2015-05-23: 6.25 mg via INTRAVENOUS

## 2015-05-23 MED ORDER — TRAMADOL HCL 50 MG PO TABS
50.0000 mg | ORAL_TABLET | Freq: Four times a day (QID) | ORAL | Status: DC | PRN
  Administered 2015-05-24 – 2015-05-25 (×4): 100 mg via ORAL
  Filled 2015-05-23 (×4): qty 2

## 2015-05-23 MED ORDER — CHLORHEXIDINE GLUCONATE 4 % EX LIQD: 60.0000 mL | Freq: Once | CUTANEOUS | Status: DC

## 2015-05-23 MED ORDER — ONDANSETRON HCL 4 MG/2ML IJ SOLN
4.0000 mg | Freq: Four times a day (QID) | INTRAMUSCULAR | Status: AC | PRN
  Administered 2015-05-23: 4 mg via INTRAVENOUS

## 2015-05-23 MED ORDER — BUPIVACAINE LIPOSOME 1.3 % IJ SUSP
20.0000 mL | Freq: Once | INTRAMUSCULAR | Status: DC
  Filled 2015-05-23: qty 20

## 2015-05-23 MED ORDER — ROCURONIUM BROMIDE 100 MG/10ML IV SOLN
INTRAVENOUS | Status: DC | PRN
  Administered 2015-05-23: 20 mg via INTRAVENOUS

## 2015-05-23 MED ORDER — METHOCARBAMOL 500 MG PO TABS
500.0000 mg | ORAL_TABLET | Freq: Four times a day (QID) | ORAL | Status: DC | PRN
  Administered 2015-05-24 – 2015-05-26 (×7): 500 mg via ORAL
  Filled 2015-05-23 (×9): qty 1

## 2015-05-23 MED ORDER — ACETAMINOPHEN 650 MG RE SUPP: 650.0000 mg | Freq: Four times a day (QID) | RECTAL | Status: DC | PRN

## 2015-05-23 MED ORDER — LACTATED RINGERS IV SOLN
INTRAVENOUS | Status: DC | PRN
  Administered 2015-05-23: 16:00:00 via INTRAVENOUS

## 2015-05-23 MED ORDER — LIDOCAINE HCL (CARDIAC) 20 MG/ML IV SOLN
INTRAVENOUS | Status: AC
  Filled 2015-05-23: qty 5

## 2015-05-23 MED ORDER — LACTATED RINGERS IV SOLN
INTRAVENOUS | Status: DC
  Administered 2015-05-23: 16:00:00 via INTRAVENOUS
  Administered 2015-05-23: 1000 mL via INTRAVENOUS

## 2015-05-23 MED ORDER — FENTANYL CITRATE (PF) 100 MCG/2ML IJ SOLN
25.0000 ug | INTRAMUSCULAR | Status: DC | PRN
  Administered 2015-05-23: 25 ug via INTRAVENOUS

## 2015-05-23 MED ORDER — BUPIVACAINE LIPOSOME 1.3 % IJ SUSP
INTRAMUSCULAR | Status: DC | PRN
  Administered 2015-05-23: 20 mL

## 2015-05-23 MED ORDER — CEFAZOLIN SODIUM-DEXTROSE 2-3 GM-% IV SOLR
INTRAVENOUS | Status: AC
  Filled 2015-05-23: qty 50

## 2015-05-23 MED ORDER — OXYCODONE HCL 5 MG PO TABS: 5.0000 mg | ORAL_TABLET | Freq: Once | ORAL | Status: DC | PRN

## 2015-05-23 MED ORDER — DEXAMETHASONE SODIUM PHOSPHATE 10 MG/ML IJ SOLN
INTRAMUSCULAR | Status: AC
  Filled 2015-05-23: qty 1

## 2015-05-23 MED ORDER — MORPHINE SULFATE 2 MG/ML IJ SOLN: 1.0000 mg | INTRAMUSCULAR | Status: DC | PRN

## 2015-05-23 MED ORDER — BUPIVACAINE HCL (PF) 0.25 % IJ SOLN
INTRAMUSCULAR | Status: AC
  Filled 2015-05-23: qty 30

## 2015-05-23 MED ORDER — POTASSIUM CHLORIDE IN NACL 20-0.9 MEQ/L-% IV SOLN
INTRAVENOUS | Status: DC
  Administered 2015-05-23: via INTRAVENOUS
  Filled 2015-05-23 (×6): qty 1000

## 2015-05-23 MED ORDER — PROPOFOL 10 MG/ML IV BOLUS
INTRAVENOUS | Status: DC | PRN
  Administered 2015-05-23: 150 mg via INTRAVENOUS

## 2015-05-23 MED ORDER — METOCLOPRAMIDE HCL 5 MG/ML IJ SOLN: 5.0000 mg | Freq: Three times a day (TID) | INTRAMUSCULAR | Status: DC | PRN

## 2015-05-23 MED ORDER — FENTANYL CITRATE (PF) 100 MCG/2ML IJ SOLN
INTRAMUSCULAR | Status: DC | PRN
  Administered 2015-05-23: 50 ug via INTRAVENOUS
  Administered 2015-05-23: 100 ug via INTRAVENOUS
  Administered 2015-05-23 (×2): 50 ug via INTRAVENOUS
  Administered 2015-05-23: 100 ug via INTRAVENOUS
  Administered 2015-05-23: 50 ug via INTRAVENOUS

## 2015-05-23 MED ORDER — OXYCODONE HCL 5 MG/5ML PO SOLN
5.0000 mg | Freq: Once | ORAL | Status: DC | PRN
  Filled 2015-05-23: qty 5

## 2015-05-23 MED ORDER — ACETAMINOPHEN 10 MG/ML IV SOLN
INTRAVENOUS | Status: AC
Start: 2015-05-23 — End: 2015-05-23
  Filled 2015-05-23: qty 100

## 2015-05-23 MED ORDER — DEXAMETHASONE SODIUM PHOSPHATE 10 MG/ML IJ SOLN
10.0000 mg | Freq: Once | INTRAMUSCULAR | Status: AC
  Administered 2015-05-24: 10 mg via INTRAVENOUS
  Filled 2015-05-23 (×2): qty 1

## 2015-05-23 MED ORDER — ATORVASTATIN CALCIUM 20 MG PO TABS
20.0000 mg | ORAL_TABLET | Freq: Every evening | ORAL | Status: DC
  Administered 2015-05-23 – 2015-05-25 (×3): 20 mg via ORAL
  Filled 2015-05-23 (×4): qty 1

## 2015-05-23 MED ORDER — HYDROMORPHONE HCL 1 MG/ML IJ SOLN
0.2500 mg | INTRAMUSCULAR | Status: DC | PRN
  Administered 2015-05-23 (×2): 0.25 mg via INTRAVENOUS
  Administered 2015-05-23: 0.5 mg via INTRAVENOUS
  Administered 2015-05-23: 0.25 mg via INTRAVENOUS
  Administered 2015-05-23: 0.5 mg via INTRAVENOUS
  Administered 2015-05-23: 0.25 mg via INTRAVENOUS

## 2015-05-23 MED ORDER — CEFAZOLIN SODIUM-DEXTROSE 2-3 GM-% IV SOLR
2.0000 g | INTRAVENOUS | Status: AC
  Administered 2015-05-23: 2 g via INTRAVENOUS

## 2015-05-23 MED ORDER — ONDANSETRON HCL 4 MG PO TABS
4.0000 mg | ORAL_TABLET | Freq: Four times a day (QID) | ORAL | Status: DC | PRN
  Administered 2015-05-24: 4 mg via ORAL
  Filled 2015-05-23: qty 1

## 2015-05-23 MED ORDER — RIVAROXABAN 10 MG PO TABS
10.0000 mg | ORAL_TABLET | Freq: Every day | ORAL | Status: DC
  Administered 2015-05-24 – 2015-05-26 (×3): 10 mg via ORAL
  Filled 2015-05-23 (×4): qty 1

## 2015-05-23 MED ORDER — HYDROMORPHONE HCL 2 MG/ML IJ SOLN
INTRAMUSCULAR | Status: AC
  Filled 2015-05-23: qty 1

## 2015-05-23 MED ORDER — METHOCARBAMOL 1000 MG/10ML IJ SOLN
500.0000 mg | Freq: Four times a day (QID) | INTRAVENOUS | Status: DC | PRN
  Administered 2015-05-23: 500 mg via INTRAVENOUS
  Filled 2015-05-23 (×2): qty 5

## 2015-05-23 MED ORDER — SODIUM CHLORIDE 0.9 % IJ SOLN
INTRAMUSCULAR | Status: DC | PRN
  Administered 2015-05-23: 30 mL

## 2015-05-23 MED ORDER — 0.9 % SODIUM CHLORIDE (POUR BTL) OPTIME
TOPICAL | Status: DC | PRN
  Administered 2015-05-23: 1000 mL

## 2015-05-23 MED ORDER — OLMESARTAN MEDOXOMIL-HCTZ 40-12.5 MG PO TABS: 1.0000 | ORAL_TABLET | Freq: Every evening | ORAL | Status: DC

## 2015-05-23 MED ORDER — DEXAMETHASONE SODIUM PHOSPHATE 10 MG/ML IJ SOLN: 10.0000 mg | Freq: Once | INTRAMUSCULAR | Status: DC

## 2015-05-23 MED ORDER — BUPIVACAINE HCL (PF) 0.25 % IJ SOLN
INTRAMUSCULAR | Status: DC | PRN
  Administered 2015-05-23: 20 mL

## 2015-05-23 MED ORDER — IRBESARTAN 300 MG PO TABS
300.0000 mg | ORAL_TABLET | Freq: Every evening | ORAL | Status: DC
  Administered 2015-05-23 – 2015-05-25 (×3): 300 mg via ORAL
  Filled 2015-05-23 (×4): qty 1

## 2015-05-23 MED ORDER — POLYETHYLENE GLYCOL 3350 17 G PO PACK: 17.0000 g | PACK | Freq: Every day | ORAL | Status: DC | PRN

## 2015-05-23 MED ORDER — HYDROMORPHONE HCL 1 MG/ML IJ SOLN
INTRAMUSCULAR | Status: DC | PRN
  Administered 2015-05-23 (×2): 1 mg via INTRAVENOUS

## 2015-05-23 MED ORDER — DIPHENHYDRAMINE HCL 12.5 MG/5ML PO ELIX: 12.5000 mg | ORAL_SOLUTION | ORAL | Status: DC | PRN

## 2015-05-23 MED ORDER — SODIUM CHLORIDE 0.9 % IV SOLN: Freq: Once | INTRAVENOUS | Status: DC

## 2015-05-23 MED ORDER — STERILE WATER FOR IRRIGATION IR SOLN
Status: DC | PRN
  Administered 2015-05-23: 1500 mL

## 2015-05-23 SURGICAL SUPPLY — 77 items
ADAPTER BOLT FEMORAL +2/-2 (Knees) ×2 IMPLANT
ADPR FEM +2/-2 OFST BOLT (Knees) ×1 IMPLANT
ADPR FEM 5D STRL KN PFC SGM (Orthopedic Implant) ×1 IMPLANT
AUG FEM 4 4 CMB LF KN POST (Knees) ×2 IMPLANT
AUG FEM 4 4 STRL LF KN RT (Knees) ×2 IMPLANT
AUG TIB SZ4 10 REV STP WDG (Knees) ×2 IMPLANT
AUGMENT POSTEERIOR PFC SZ4 RT (Knees) IMPLANT
AUGMENT POSTERIOR PFC SZ4 4MM (Knees) IMPLANT
BAG SPEC THK2 15X12 ZIP CLS (MISCELLANEOUS) ×1
BAG ZIPLOCK 12X15 (MISCELLANEOUS) ×3 IMPLANT
BANDAGE ELASTIC 6 VELCRO ST LF (GAUZE/BANDAGES/DRESSINGS) ×3 IMPLANT
BANDAGE ESMARK 6X9 LF (GAUZE/BANDAGES/DRESSINGS) ×1 IMPLANT
BLADE SAG 18X100X1.27 (BLADE) ×3 IMPLANT
BLADE SAW SGTL 11.0X1.19X90.0M (BLADE) ×3 IMPLANT
BNDG CMPR 9X6 STRL LF SNTH (GAUZE/BANDAGES/DRESSINGS) ×1
BNDG ESMARK 6X9 LF (GAUZE/BANDAGES/DRESSINGS) ×3
BONE CEMENT GENTAMICIN (Cement) ×12 IMPLANT
CEMENT BONE GENTAMICIN 40 (Cement) ×3 IMPLANT
CEMENT RESTRICTOR DEPUY SZ 4 (Cement) ×4 IMPLANT
CLOSURE WOUND 1/2 X4 (GAUZE/BANDAGES/DRESSINGS) ×2
COMP FEM CEM RT SZ4 (Orthopedic Implant) ×3 IMPLANT
COMPONENT FEM CEM RT SZ4 (Orthopedic Implant) IMPLANT
CUFF TOURN SGL QUICK 34 (TOURNIQUET CUFF) ×3
CUFF TRNQT CYL 34X4X40X1 (TOURNIQUET CUFF) ×1 IMPLANT
DRAPE EXTREMITY T 121X128X90 (DRAPE) ×3 IMPLANT
DRAPE POUCH INSTRU U-SHP 10X18 (DRAPES) ×3 IMPLANT
DRAPE U-SHAPE 47X51 STRL (DRAPES) ×3 IMPLANT
DRSG ADAPTIC 3X8 NADH LF (GAUZE/BANDAGES/DRESSINGS) ×3 IMPLANT
DRSG PAD ABDOMINAL 8X10 ST (GAUZE/BANDAGES/DRESSINGS) ×3 IMPLANT
DURAPREP 26ML APPLICATOR (WOUND CARE) ×3 IMPLANT
ELECT REM PT RETURN 9FT ADLT (ELECTROSURGICAL) ×3
ELECTRODE REM PT RTRN 9FT ADLT (ELECTROSURGICAL) ×1 IMPLANT
EVACUATOR 1/8 PVC DRAIN (DRAIN) ×3 IMPLANT
FACESHIELD WRAPAROUND (MASK) ×15 IMPLANT
FACESHIELD WRAPAROUND OR TEAM (MASK) ×5 IMPLANT
FEMORAL ADAPTER (Orthopedic Implant) ×2 IMPLANT
GAUZE SPONGE 4X4 12PLY STRL (GAUZE/BANDAGES/DRESSINGS) ×3 IMPLANT
GLOVE BIO SURGEON STRL SZ7.5 (GLOVE) ×3 IMPLANT
GLOVE BIO SURGEON STRL SZ8 (GLOVE) ×3 IMPLANT
GLOVE BIOGEL PI IND STRL 8 (GLOVE) ×3 IMPLANT
GLOVE BIOGEL PI INDICATOR 8 (GLOVE) ×6
GOWN STRL REUS W/TWL LRG LVL3 (GOWN DISPOSABLE) ×3 IMPLANT
GOWN STRL REUS W/TWL XL LVL3 (GOWN DISPOSABLE) ×3 IMPLANT
HANDPIECE INTERPULSE COAX TIP (DISPOSABLE) ×3
IMMOBILIZER KNEE 20 (SOFTGOODS) ×3
IMMOBILIZER KNEE 20 THIGH 36 (SOFTGOODS) ×1 IMPLANT
INSERT TC3 TIBIAL SZ4 20.0 (Knees) ×2 IMPLANT
KIT BASIN OR (CUSTOM PROCEDURE TRAY) ×3 IMPLANT
MANIFOLD NEPTUNE II (INSTRUMENTS) ×3 IMPLANT
NS IRRIG 1000ML POUR BTL (IV SOLUTION) ×3 IMPLANT
PACK TOTAL JOINT (CUSTOM PROCEDURE TRAY) ×3 IMPLANT
PADDING CAST COTTON 6X4 STRL (CAST SUPPLIES) ×6 IMPLANT
PATELLA DOME PFC 38MM (Knees) ×2 IMPLANT
POSITIONER SURGICAL ARM (MISCELLANEOUS) ×3 IMPLANT
POSTERIOR AUGMENT PFC SZ4 4MM (Knees) ×6 IMPLANT
POSTERIOR AUGMENT PFC SZ4 RT (Knees) ×6 IMPLANT
SET HNDPC FAN SPRY TIP SCT (DISPOSABLE) ×1 IMPLANT
SPONGE LAP 18X18 X RAY DECT (DISPOSABLE) ×2 IMPLANT
STAPLER VISISTAT 35W (STAPLE) ×3 IMPLANT
STEM TIBIA PFC 13X30MM (Stem) ×2 IMPLANT
STEM UNIVERSAL REVISION 75X18 (Stem) ×2 IMPLANT
STRIP CLOSURE SKIN 1/2X4 (GAUZE/BANDAGES/DRESSINGS) ×2 IMPLANT
SUCTION FRAZIER 12FR DISP (SUCTIONS) ×3 IMPLANT
SUT PDS AB 1 CT1 27 (SUTURE) ×9 IMPLANT
SUT VIC AB 2-0 CT1 27 (SUTURE) ×9
SUT VIC AB 2-0 CT1 TAPERPNT 27 (SUTURE) ×3 IMPLANT
SUT VLOC 180 0 24IN GS25 (SUTURE) ×3 IMPLANT
SWAB COLLECTION DEVICE MRSA (MISCELLANEOUS) ×3 IMPLANT
TOWEL OR 17X26 10 PK STRL BLUE (TOWEL DISPOSABLE) ×6 IMPLANT
TOWER CARTRIDGE SMART MIX (DISPOSABLE) ×3 IMPLANT
TRAY FOLEY W/METER SILVER 14FR (SET/KITS/TRAYS/PACK) ×3 IMPLANT
TRAY REVISION SZ 4 (Knees) ×2 IMPLANT
TRAY SLEEVE CEM ML (Knees) ×2 IMPLANT
TUBE ANAEROBIC SPECIMEN COL (MISCELLANEOUS) ×3 IMPLANT
WATER STERILE IRR 1500ML POUR (IV SOLUTION) ×3 IMPLANT
WEDGE SIZE 4 10MM (Knees) ×4 IMPLANT
WRAP KNEE MAXI GEL POST OP (GAUZE/BANDAGES/DRESSINGS) ×4 IMPLANT

## 2015-05-23 NOTE — Transfer of Care (Signed)
Immediate Anesthesia Transfer of Care Note  Patient: Beth [name]  Procedure(s) Performed: Procedure(s): RIGHT KNEE ARTHROPLASTY REIMPLANTATION (Right)  Patient Location: PACU  Anesthesia Type:General  Level of Consciousness:  sedated, patient cooperative and responds to stimulation  Airway & Oxygen Therapy:Patient Spontanous Breathing and Patient connected to face mask oxgen  Post-op Assessment:  Report given to PACU RN and Post -op Vital signs reviewed and stable  Post vital signs:  Reviewed and stable  Last Vitals:  Filed Vitals:   05/23/15 1324  BP: 174/94  Pulse: 75  Temp: 36.6 C  Resp: 16    Complications: No apparent anesthesia complications

## 2015-05-23 NOTE — Anesthesia Procedure Notes (Signed)
Procedure Name: Intubation Date/Time: 05/23/2015 3:22 PM Performed by: Maxwell Caul Pre-anesthesia Checklist: Patient identified, Emergency Drugs available, Suction available and Patient being monitored Patient Re-evaluated:Patient Re-evaluated prior to inductionOxygen Delivery Method: Circle System Utilized Preoxygenation: Pre-oxygenation with 100% oxygen Intubation Type: IV induction Ventilation: Mask ventilation without difficulty Laryngoscope Size: Mac and 4 Grade View: Grade I Tube type: Oral Tube size: 7.5 mm Number of attempts: 1 Airway Equipment and Method: Stylet and Oral airway Placement Confirmation: ETT inserted through vocal cords under direct vision,  positive ETCO2 and breath sounds checked- equal and bilateral Secured at: 21 cm Tube secured with: Tape Dental Injury: Teeth and Oropharynx as per pre-operative assessment

## 2015-05-23 NOTE — Anesthesia Preprocedure Evaluation (Signed)
Anesthesia Evaluation  Patient identified by MRN, date of birth, ID band Patient awake    Reviewed: Allergy & Precautions, NPO status , Patient's Chart, lab work & pertinent test results  Airway Mallampati: II   Neck ROM: full    Dental   Pulmonary  breath sounds clear to auscultation        Cardiovascular hypertension, Rhythm:regular Rate:Normal     Neuro/Psych CVA    GI/Hepatic   Endo/Other  obese  Renal/GU      Musculoskeletal  (+) Arthritis -,   Abdominal   Peds  Hematology   Anesthesia Other Findings   Reproductive/Obstetrics                             Anesthesia Physical Anesthesia Plan  ASA: II  Anesthesia Plan: General   Post-op Pain Management:    Induction: Intravenous  Airway Management Planned: Oral ETT  Additional Equipment:   Intra-op Plan:   Post-operative Plan: Extubation in OR  Informed Consent: I have reviewed the patients History and Physical, chart, labs and discussed the procedure including the risks, benefits and alternatives for the proposed anesthesia with the patient or authorized representative who has indicated his/her understanding and acceptance.     Plan Discussed with: CRNA, Anesthesiologist and Surgeon  Anesthesia Plan Comments:         Anesthesia Quick Evaluation

## 2015-05-23 NOTE — Brief Op Note (Signed)
05/23/2015  5:54 PM  PATIENT:  Beth [name]  69 y.o. female  PRE-OPERATIVE DIAGNOSIS:  RESECTION ARTHROPLASTY OF RIGHT KNEE  POST-OPERATIVE DIAGNOSIS:  RESECTION ARTHROPLASTY OF RIGHT KNEE  PROCEDURE:  Procedure(s): RIGHT KNEE ARTHROPLASTY REIMPLANTATION (Right)  SURGEON:  Surgeon(s) and Role:    * [name], MD - Primary  PHYSICIAN ASSISTANT:   ASSISTANTS: Molli Barrows, PA-C   ANESTHESIA:   general  EBL:  Total I/O In: 8616 [I.V.:1300; Blood:335] Out: 1300 [Urine:700; Blood:600]  BLOOD ADMINISTERED:none  DRAINS: (Medium) Hemovact drain(s) in the right knee with  Suction Open   LOCAL MEDICATIONS USED:  OTHER Exparel  COUNTS:  YES  TOURNIQUET:   Total Tourniquet Time Documented: Thigh (Right) - 117 minutes Total: Thigh (Right) - 117 minutes   DICTATION: .Other Dictation: Dictation Number [id number]  PLAN OF CARE: Admit to inpatient   PATIENT DISPOSITION:  PACU - hemodynamically stable.

## 2015-05-23 NOTE — Anesthesia Postprocedure Evaluation (Signed)
Anesthesia Post Note  Patient: Beth [name]  Procedure(s) Performed: Procedure(s) (LRB): RIGHT KNEE ARTHROPLASTY REIMPLANTATION (Right)  Anesthesia type: General  Patient location: PACU  Post pain: Pain level controlled and Adequate analgesia  Post assessment: Post-op Vital signs reviewed, Patient's Cardiovascular Status Stable, Respiratory Function Stable, Patent Airway and Pain level controlled  Last Vitals:  Filed Vitals:   05/23/15 1845  BP: 145/89  Pulse: 73  Temp:   Resp: 14    Post vital signs: Reviewed and stable  Level of consciousness: awake, alert  and oriented  Complications: No apparent anesthesia complications

## 2015-05-24 ENCOUNTER — Encounter (HOSPITAL_COMMUNITY): Payer: Self-pay | Admitting: Orthopedic Surgery

## 2015-05-24 LAB — CBC
HEMATOCRIT: 26.8 % — AB (ref 36.0–46.0)
Hemoglobin: 8.5 g/dL — ABNORMAL LOW (ref 12.0–15.0)
MCH: 24 pg — ABNORMAL LOW (ref 26.0–34.0)
MCHC: 31.7 g/dL (ref 30.0–36.0)
MCV: 75.7 fL — AB (ref 78.0–100.0)
PLATELETS: 347 10*3/uL (ref 150–400)
RBC: 3.54 MIL/uL — ABNORMAL LOW (ref 3.87–5.11)
RDW: 18.3 % — ABNORMAL HIGH (ref 11.5–15.5)
WBC: 9.4 10*3/uL (ref 4.0–10.5)

## 2015-05-24 LAB — BASIC METABOLIC PANEL
ANION GAP: 9 (ref 5–15)
BUN: 23 mg/dL — ABNORMAL HIGH (ref 6–20)
CALCIUM: 8.4 mg/dL — AB (ref 8.9–10.3)
CO2: 25 mmol/L (ref 22–32)
Chloride: 101 mmol/L (ref 101–111)
Creatinine, Ser: 1.13 mg/dL — ABNORMAL HIGH (ref 0.44–1.00)
GFR calc Af Amer: 55 mL/min — ABNORMAL LOW (ref >60)
GFR calc non Af Amer: 48 mL/min — ABNORMAL LOW (ref >60)
GLUCOSE: 244 mg/dL — AB (ref 65–99)
Potassium: 4.5 mmol/L (ref 3.5–5.1)
Sodium: 135 mmol/L (ref 135–145)

## 2015-05-24 MED ORDER — ENSURE ENLIVE PO LIQD
237.0000 mL | Freq: Two times a day (BID) | ORAL | Status: DC
  Administered 2015-05-24 – 2015-05-25 (×3): 237 mL via ORAL

## 2015-05-24 MED ORDER — RIVAROXABAN 10 MG PO TABS: 10.0000 mg | ORAL_TABLET | Freq: Every day | ORAL | Status: DC

## 2015-05-24 MED ORDER — METHOCARBAMOL 500 MG PO TABS: 500.0000 mg | ORAL_TABLET | Freq: Four times a day (QID) | ORAL | Status: DC | PRN

## 2015-05-24 MED ORDER — TRAMADOL HCL 50 MG PO TABS: 50.0000 mg | ORAL_TABLET | Freq: Four times a day (QID) | ORAL | Status: DC | PRN

## 2015-05-24 MED ORDER — OXYCODONE HCL 5 MG PO TABS: 5.0000 mg | ORAL_TABLET | ORAL | Status: DC | PRN

## 2015-05-24 NOTE — Progress Notes (Signed)
Initial Nutrition Assessment  DOCUMENTATION CODES:  Non-severe (moderate) malnutrition in context of chronic illness  INTERVENTION:  Ensure Enlive (each supplement provides 350kcal and 20 grams of protein)- provide BID between meals  NUTRITION DIAGNOSIS:  Malnutrition related to chronic illness as evidenced by percent weight loss, energy intake < or equal to 75% for > or equal to 1 month.   GOAL:  Patient will meet greater than or equal to 90% of their needs  MONITOR:  PO intake, Supplement acceptance, Labs, Weight trends  REASON FOR ASSESSMENT:  Malnutrition Screening Tool    ASSESSMENT: 69 year old female who comes in today for a preoperative History and Physical. The patient is scheduled for a right total knee reimplantation to be performed by Dr. Dione Plover. Aluisio, MD at [facility] on 05/23/2015.  1 Day Post-Op Procedure(s) (LRB): RIGHT KNEE ARTHROPLASTY REIMPLANTATION (Right)  - Pt reports poor po intake for the past several months. Weight has dropped from 237 lbs to 190 lbs (20% wt loss- significant for time frame.) - Discussed importance of not skipping meals (pt often skips dinner or breakfast) and incorporating high-calorie, high-protein foods into meals. Teach-back used. - RD to order nutritional supplements to improve po intake.  - Labs and medications reviewed  Height:  Ht Readings from Last 1 Encounters:  05/23/15 5' 5.5" (1.664 m)    Weight:  Wt Readings from Last 1 Encounters:  05/23/15 190 lb (86.183 kg)    Ideal Body Weight:  58 kg  Wt Readings from Last 10 Encounters:  05/23/15 190 lb (86.183 kg)  04/30/15 190 lb (86.183 kg)  04/18/15 209 lb (94.802 kg)  03/19/15 209 lb (94.802 kg)  02/16/15 208 lb (94.348 kg)  02/09/15 208 lb 9 oz (94.603 kg)  10/20/14 221 lb (100.245 kg)  10/11/14 222 lb (100.699 kg)  10/06/14 222 lb (100.699 kg)  10/02/14 222 lb 4.8 oz (100.835 kg)    BMI:  Body mass index is 31.13 kg/(m^2).  Estimated  Nutritional Needs:  Kcal:  3149-7026  Protein:  120-130 g  Fluid:  2.0 L/day  Skin:  Wound (see comment) (closed incision on right knee)  Diet Order:  Diet regular Room service appropriate?: Yes; Fluid consistency:: Thin Diet - low sodium heart healthy  EDUCATION NEEDS:  Education needs addressed   Intake/Output Summary (Last 24 hours) at 05/24/15 1413 Last data filed at 05/24/15 1400  Gross per 24 hour  Intake   3520 ml  Output   1905 ml  Net   1615 ml    Last BM:  Prior to admission  Laurette Schimke [place], [place], [place]

## 2015-05-24 NOTE — Discharge Instructions (Signed)
° °Dr. Frank Aluisio °Total Joint Specialist °[facility] °[address] °[place], [place] [address] °[phone] ° °TOTAL KNEE REPLACEMENT POSTOPERATIVE DIRECTIONS ° ° ° °Knee Rehabilitation, Guidelines Following Surgery  °Results after knee surgery are often greatly improved when you follow the exercise, range of motion and muscle strengthening exercises prescribed by your doctor. Safety measures are also important to protect the knee from further injury. Any time any of these exercises cause you to have increased pain or swelling in your knee joint, decrease the amount until you are comfortable again and slowly increase them. If you have problems or questions, call your caregiver or physical therapist for advice.  ° °HOME CARE INSTRUCTIONS  °Remove items at home which could result in a fall. This includes throw rugs or furniture in walking pathways.  °Continue medications as instructed at time of discharge. °You may have some home medications which will be placed on hold until you complete the course of blood thinner medication.  °You may start showering once you are discharged home but do not submerge the incision under water. Just pat the incision dry and apply a dry gauze dressing on daily. °Walk with walker as instructed.  °You may resume a sexual relationship in one month or when given the OK by  your doctor.  °· Use walker as long as suggested by your caregivers. °· Avoid periods of inactivity such as sitting longer than an hour when not asleep. This helps prevent blood clots.  °You may return to work once you are cleared by your doctor.  °Do not drive a car for 6 weeks or until released by you surgeon.  °· Do not drive while taking narcotics.  °Wear the elastic stockings for three weeks following surgery during the day but you may remove then at night. °Make sure you keep all of your appointments after your operation with all of your doctors and caregivers. You should call the  office at the above phone number and make an appointment for approximately two weeks after the date of your surgery. °Change the dressing daily and reapply a dry dressing each time. °Please pick up a stool softener and laxative for home use as long as you are requiring pain medications. °· ICE to the affected knee every three hours for 30 minutes at a time and then as needed for pain and swelling.  Continue to use ice on the knee for pain and swelling from surgery. You may notice swelling that will progress down to the foot and ankle.  This is normal after surgery.  Elevate the leg when you are not up walking on it.   °It is important for you to complete the blood thinner medication as prescribed by your doctor. °· Continue to use the breathing machine which will help keep your temperature down.  It is common for your temperature to cycle up and down following surgery, especially at night when you are not up moving around and exerting yourself.  The breathing machine keeps your lungs expanded and your temperature down. ° °RANGE OF MOTION AND STRENGTHENING EXERCISES  °Rehabilitation of the knee is important following a knee injury or an operation. After just a few days of immobilization, the muscles of the thigh which control the knee become weakened and shrink (atrophy). Knee exercises are designed to build up the tone and strength of the thigh muscles and to improve knee motion. Often times heat used for twenty to thirty minutes before working out will loosen up   your tissues and help with improving the range of motion but do not use heat for the first two weeks following surgery. These exercises can be done on a training (exercise) mat, on the floor, on a table or on a bed. Use what ever works the best and is most comfortable for you Knee exercises include:  Leg Lifts - While your knee is still immobilized in a splint or cast, you can do straight leg raises. Lift the leg to 60 degrees, hold for 3 sec, and slowly  lower the leg. Repeat 10-20 times 2-3 times daily. Perform this exercise against resistance later as your knee gets better.  Quad and Hamstring Sets - Tighten up the muscle on the front of the thigh (Quad) and hold for 5-10 sec. Repeat this 10-20 times hourly. Hamstring sets are done by pushing the foot backward against an object and holding for 5-10 sec. Repeat as with quad sets.  A rehabilitation program following serious knee injuries can speed recovery and prevent re-injury in the future due to weakened muscles. Contact your doctor or a physical therapist for more information on knee rehabilitation.   SKILLED REHAB INSTRUCTIONS: If the patient is transferred to a skilled rehab facility following release from the hospital, a list of the current medications will be sent to the facility for the patient to continue.  When discharged from the skilled rehab facility, please have the facility set up the patient's [place] prior to being released. Also, the skilled facility will be responsible for providing the patient with their medications at time of release from the facility to include their pain medication, the muscle relaxants, and their blood thinner medication. If the patient is still at the rehab facility at time of the two week follow up appointment, the skilled rehab facility will also need to assist the patient in arranging follow up appointment in our office and any transportation needs.  MAKE SURE YOU:  Understand these instructions.  Will watch your condition.  Will get help right away if you are not doing well or get worse.    Pick up stool softner and laxative for home use following surgery while on pain medications. Do not submerge incision under water. Please use good hand washing techniques while changing dressing each day. May shower starting three days after surgery. Please use a clean towel to pat the incision dry following showers. Continue to use ice for pain  and swelling after surgery. Do not use any lotions or creams on the incision until instructed by your surgeon.  Information on my medicine - XARELTO (Rivaroxaban)  This medication education was reviewed with me or my healthcare representative as part of my discharge preparation.  The pharmacist that spoke with me during my hospital stay was:  Minda Ditto, [facility]  Why was Xarelto prescribed for you? Xarelto was prescribed for you to reduce the risk of blood clots forming after orthopedic surgery. The medical term for these abnormal blood clots is venous thromboembolism (VTE).  What do you need to know about xarelto ? Take your Xarelto ONCE DAILY at the same time every day. You may take it either with or without food.  If you have difficulty swallowing the tablet whole, you may crush it and mix in applesauce just prior to taking your dose.  Take Xarelto exactly as prescribed by your doctor and DO NOT stop taking Xarelto without talking to the doctor who prescribed the medication.  Stopping without other VTE prevention medication  to take the place of Xarelto may increase your risk of developing a clot.  After discharge, you should have regular check-up appointments with your healthcare provider that is prescribing your Xarelto.    What do you do if you miss a dose? If you miss a dose, take it as soon as you remember on the same day then continue your regularly scheduled once daily regimen the next day. Do not take two doses of Xarelto on the same day.   Important Safety Information A possible side effect of Xarelto is bleeding. You should call your healthcare provider right away if you experience any of the following: ? Bleeding from an injury or your nose that does not stop. ? Unusual colored urine (red or dark brown) or unusual colored stools (red or black). ? Unusual bruising for unknown reasons. ? A serious fall or if you hit your head (even if there is no bleeding).  Some  medicines may interact with Xarelto and might increase your risk of bleeding while on Xarelto. To help avoid this, consult your healthcare provider or pharmacist prior to using any new prescription or non-prescription medications, including herbals, vitamins, non-steroidal anti-inflammatory drugs (NSAIDs) and supplements.  This website has more information on Xarelto: [email or web address]

## 2015-05-24 NOTE — Progress Notes (Signed)
Physical Therapy Treatment Patient Details Name: XITLALLY MOONEYHAM MRN: [id number] DOB: [birth year] Today's Date: 05/24/2015    History of Present Illness Reimplantation R TKR; s/p spacer placement 3/16    PT Comments    Pt very motivated and eager to regain independence  Follow Up Recommendations  SNF     Equipment Recommendations  None recommended by PT    Recommendations for Other Services OT consult     Precautions / Restrictions Precautions Precautions: Knee;Fall Required Braces or Orthoses: Knee Immobilizer - Right Knee Immobilizer - Right: Discontinue once straight leg raise with < 10 degree lag Restrictions Weight Bearing Restrictions: Yes RLE Weight Bearing: Partial weight bearing Other Position/Activity Restrictions: NO ROM at knee past 60 degrees    Mobility  Bed Mobility Overal bed mobility: Needs Assistance Bed Mobility: Sit to Supine       Sit to supine: Min assist   General bed mobility comments: cues for sequence and use of L LE to self assist  Transfers Overall transfer level: Needs assistance Equipment used: Rolling walker (2 wheeled) Transfers: Sit to/from Stand Sit to Stand: Min assist;Mod assist         General transfer comment: cues for LE management and use of UEs to self assist  Ambulation/Gait Ambulation/Gait assistance: Min assist Ambulation Distance (Feet): 38 Feet (and 5' from [facility] to bed) Assistive device: Rolling walker (2 wheeled) Gait Pattern/deviations: Step-to pattern;Decreased step length - right;Decreased step length - left;Shuffle;Trunk flexed Gait velocity: decr   General Gait Details: cues for sequence, posture, position from RW and PWB   Stairs            Wheelchair Mobility    Modified Rankin (Stroke Patients Only)       Balance                                    Cognition Arousal/Alertness: Awake/alert Behavior During Therapy: WFL for tasks assessed/performed Overall Cognitive Status:  Within Functional Limits for tasks assessed                      Exercises      General Comments        Pertinent Vitals/Pain Pain Assessment: 0-10 Pain Score: 5  Pain Location: R knee Pain Descriptors / Indicators: Aching;Sore Pain Intervention(s): Limited activity within patient's tolerance;Monitored during session;Premedicated before session;Ice applied    Home Living                      Prior Function            PT Goals (current goals can now be found in the care plan section) Acute Rehab PT Goals Patient Stated Goal: Walk without pain PT Goal Formulation: With patient Time For Goal Achievement: 05/31/15 Potential to Achieve Goals: Good Progress towards PT goals: Progressing toward goals    Frequency  7X/week    PT Plan Current plan remains appropriate    Co-evaluation             End of Session Equipment Utilized During Treatment: Gait belt;Right knee immobilizer Activity Tolerance: Patient tolerated treatment well Patient left: with call bell/phone within reach;in bed;with family/visitor present     Time: 4970-2637 PT Time Calculation (min) (ACUTE ONLY): 31 min  Charges:  $Gait Training: 8-22 mins $Therapeutic Activity: 8-22 mins  G Codes:      Lasalle Abee 06-02-2015, 5:02 PM

## 2015-05-24 NOTE — Progress Notes (Signed)
   Subjective: 1 Day Post-Op Procedure(s) (LRB): RIGHT KNEE ARTHROPLASTY REIMPLANTATION (Right) Patient reports pain as mild.   We will start therapy today.  Plan is to go Home after hospital stay.  Objective: Vital signs in last 24 hours: Temp:  [97.3 F (36.3 C)-98.4 F (36.9 C)] 98.4 F (36.9 C) (06/16 0619) Pulse Rate:  [60-83] 60 (06/16 0619) Resp:  [11-18] 16 (06/16 0619) BP: (128-174)/(74-103) 132/80 mmHg (06/16 0619) SpO2:  [95 %-100 %] 95 % (06/16 0619) Weight:  [86.183 kg (190 lb)] 86.183 kg (190 lb) (06/15 1345)  Intake/Output from previous day:  Intake/Output Summary (Last 24 hours) at 05/24/15 0653 Last data filed at 05/24/15 6073  Gross per 24 hour  Intake   3520 ml  Output   1685 ml  Net   1835 ml    Intake/Output this shift: Total I/O In: 585 [P.O.:180; I.V.:400; Other:5] Out: 385 [Urine:335; Drains:50]  Labs:  Recent Labs  05/24/15 0418  HGB 8.5*    Recent Labs  05/24/15 0418  WBC 9.4  RBC 3.54*  HCT 26.8*  PLT 347    Recent Labs  05/24/15 0418  NA 135  K 4.5  CL 101  CO2 25  BUN 23*  CREATININE 1.13*  GLUCOSE 244*  CALCIUM 8.4*   No results for input(s): LABPT, INR in the last 72 hours.  EXAM General - Patient is Alert, Appropriate and Oriented Extremity - Neurologically intact Neurovascular intact No cellulitis present Compartment soft Dressing - dressing C/D/I Motor Function - intact, moving foot and toes well on exam.  Hemovac to remain until tomorrow  Past Medical History  Diagnosis Date  . Hypertension   . CVA (cerebral infarction)     2006  . Hyperlipidemia   . Blood transfusion without reported diagnosis 2012    anemia;pt denies transfusion stated was only on iron tablet  . Heart murmur   . Nocturia     3-4 times per night  . Gout     left elbow  . Arthritis     Knee both knees  . Anemia   . 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 08-09-14 Right eye  . Family history of anesthesia complication       sister very slow to awaken after anesthesia;severe vomiting   . Stroke 2006    x 1 no deficits noted     Assessment/Plan: 1 Day Post-Op Procedure(s) (LRB): RIGHT KNEE ARTHROPLASTY REIMPLANTATION (Right) Active Problems:   OA (osteoarthritis) of knee   Advance diet Up with therapy D/C IV fluids Keep hemovac until tomorrow No knee flexion greater than 70 degrees  DVT Prophylaxis - Xarelto Weight-Bearing as tolerated to right leg   Charlotte Brafford V 05/24/2015, 6:53 AM

## 2015-05-24 NOTE — Care Management Note (Addendum)
Case Management Note  Patient Details  Name: Beth [name] MRN: [id number] Date of Birth: 1946/12/04  Subjective/Objective:                   RIGHT KNEE ARTHROPLASTY REIMPLANTATION (Right) Action/Plan:  Discharge planning Expected Discharge Date:                  Expected Discharge Plan:  [place]  In-House Referral:     Discharge planning Services  CM Consult  Post Acute Care Choice:    Choice offered to:     DME Arranged:    DME Agency:     HH Arranged:    [place] Agency:     Status of Service:  In process, will continue to follow  Medicare Important Message Given:    Date Medicare IM Given:    Medicare IM give by:    Date Additional Medicare IM Given:    Additional Medicare Important Message give by:     If discussed at [place] of Stay Meetings, dates discussed:    Additional Comments: CM met with pt to offer choice of home health agency.  However, pt adamantly opposed to going home and insists she is going to SNF (preferably [facility]).  CM spoke to [place] who is aware.  CM will wait to see how pt progresses and determination made as to where she will go post hospitalization: SNF vs HH.  Will continue to monitor for disposition. Dellie Catholic, RN 05/24/2015, 1:40 PM

## 2015-05-24 NOTE — Clinical Social Work Placement (Signed)
   CLINICAL SOCIAL WORK PLACEMENT  NOTE  Date:  05/24/2015  Patient Details  Name: Beth [name] MRN: [id number] Date of Birth: [birth year]  Clinical Social Work is seeking post-discharge placement for this patient at the [place] level of care (*CSW will initial, date and re-position this form in  chart as items are completed):  Yes   Patient/family provided with [facility] Work Department's list of facilities offering this level of care within the geographic area requested by the patient (or if unable, by the patient's family).  Yes   Patient/family informed of their freedom to choose among providers that offer the needed level of care, that participate in Medicare, Medicaid or managed care program needed by the patient, have an available bed and are willing to accept the patient.  Yes   Patient/family informed of [facility]'s ownership interest in [facility] and [facility], as well as of the fact that they are under no obligation to receive care at these facilities.  PASRR submitted to EDS on       PASRR number received on       Existing PASRR number confirmed on 05/24/15     FL2 transmitted to all facilities in geographic area requested by pt/family on 05/24/15     FL2 transmitted to all facilities within larger geographic area on       Patient informed that his/her managed care company has contracts with or will negotiate with certain facilities, including the following:        Yes   Patient/family informed of bed offers received.  Patient chooses bed at [facility]     Physician recommends and patient chooses bed at      Patient to be transferred to   on  .  Patient to be transferred to facility by       Patient family notified on   of transfer.  Name of family member notified:        PHYSICIAN       Additional Comment:    _______________________________________________ Luretha Rued, [place] [phone] 05/24/2015,  2:51 PM

## 2015-05-24 NOTE — Op Note (Signed)
Beth [name], Beth [name]                  ACCOUNT NO.:  [id number]  MEDICAL RECORD NO.:  [id number]  LOCATION:  57                         FACILITY:  [facility]  PHYSICIAN:  [name], M.D.    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  PROCEDURE:  Right total knee arthroplasty reimplantation.  SURGEON:  [name], MD  ASSISTANT:  Judith Part. Chabon, PA-C  ANESTHESIA:  General.  ESTIMATED BLOOD LOSS:  600.  DRAINS:  Hemovac x1.  TOURNIQUET TIME:  117 minutes at 300 mmHg.  COMPLICATIONS:  None.  CONDITION:  Stable to recovery.  BRIEF CLINICAL NOTE:  Beth [name] is a 69 year old female, complex history, regard to her right knee.  Had a total knee arthroplasty.  Infection treated with resection, arthroplasty, and antibiotic spacer.  She has had 6 weeks of IV antibiotics and labs have trended towards normal.  She presents now for total knee arthroplasty reimplantation.  PROCEDURE IN DETAIL:  After successful administration of general anesthetic, a tourniquet was placed high on the right thigh.  Right lower extremity prepped and draped in usual sterile fashion. Extremities wrapped in Esmarch, knee flexed, and tourniquet inflated to 300 mmHg.  A midline incision was made with a 10 blade through subcutaneous tissue to level of the extensor mechanism.  Subcutaneous flaps were created circumferentially to relieve the scar tissue in the area.  Fresh blade was used make a medial arthrotomy.  Minimal fluid was encountered in joint and it was sent for stat Gram stain which was negative.  Soft tissue of the proximal medial tibia subperiosteally elevated to the joint line with a knife into the semimembranosus bursa with a Cobb elevator.   Tremendous amount of scar tissue present and that is excised under the extensor mechanism, both medial and lateral.  There was some heterotopic bone superiorly and medially, which was removed. Patella would not evert into the quadriceps snip, which allowed me to evert the patella.  I was then able to remove the antibiotic spacer from the joint space.  The bone was in good condition.  She was able to flex the knee 90 degrees, removed some of the scar posteriorly by peeling it off the bone.  We then placed retractors around the tibia circumferentially and placed the extramedullary tibial cutting guide. Resection via block was pinned to remove about 3 mm off the exposed bone surface.  The block was pinned proximally at the medial aspect of the tibial tubercle and distally along the second metatarsal axis and tibial crest.  Resection was made with an oscillating saw.  Size 4 was most appropriate tibia component.  Proximal tibia was prepared to modular drill with a 13 x 30 stem on it.  We then did the keel punch.  We also prepared for a 29 mm sleeve for rotational control.  We then addressed the femur.  The femoral canal was thoroughly irrigated, then reamed up to 18 mm for excellent press-fit.  An 18-mm  reamer was left in place to serve as intramedullary cutting guide.  5 degree right valgus alignment guide was placed.  I removed 4 mm off each cut bone surface to allow for placement of 4 mm distal augments.  We then sized and size 4 was most appropriate.  Size 4 cutting blocks placed in a +2 position and rotation was marked at the epicondylar axis confirmed by creating a rectangular flexion gap at 90 degrees.  The block was pinned in this position and anterior-posterior chamfer cuts made.  Minimal bone was resected.  We needed to do 4 mm augments posteriorly to get any bone at all.  We then placed the intercondylar block and the intercondylar cut was made with a TC3.  Trials were  then placed.  On tibial side, size 4 MBT revision tray with 13 x 30 stem extension, 10 mm medial and lateral augments and a 29 mm sleeve.  On the femoral side, we had a size 4 TC3 femur with an 18 x 75 stem extension and a +2 position, 5 degrees of valgus with 4 mm medial and lateral distal augments, 4 mm medial and lateral posterior augments.  Trials were placed.  20 mm insert was placed.  Full extensions achieved with excellent varus-valgus, anterior-posterior balance throughout full range of motion.  Patella was everted.  Soft tissue was removed from the patella.  Thickness is about 18 mm.  We cut down to 14 mm.  A 35 template was placed.  Lug holes were drilled and trial patella was placed and it tracks normally.  We are still awaiting the final results of the Gram stain and once they did arrive and the Gram stain was negative, then, we opened up the components.  The components were assembled on the back table.  Trials were removed and the cut bone surfaces prepared with pulsatile lavage.  Cement restrictor trial was placed, size 4 was most appropriate with a size 4 cement restrictor was placed at the appropriate depth in the tibial canal.  Further irrigation was performed.  Cement was mixed.  Once ready for implantation, it was injected in the tibial canal and tibial cut surface.  Tibial components placed which were a size 4 MBT revision tray with 10 mm medial and lateral augments, 29 mm sleeve and 13 x 30 stem extension.  Femoral side was then cemented which is the size 4 TC3 femur, 4 mm medial and lateral distal augments, 4 mm medial and lateral posterior augments, 18 x 75 stem and a +2 position 5 degrees of valgus.  Both components were impacted and extruded cement removed.  A 20 mm trial inserts placed, knee held in full extension.  All extruded cement removed.  Patella was also cemented and held with a clamp.  Once cement was fully hardened, then a permanent 20 mm TC3 rotating  platform insert was placed in tibial tray.  The knee was reduced with excellent stability throughout full range of motion.  Wound was copiously irrigated with saline solution.  A Hemovac drain was placed into the joint and the quad snip was first closed with the interrupted #1 PDS.  Then, we closed the arthrotomy with a running #1 V-Loc suture and several interrupted #1 PDS in strategic positions.  Flexion against gravity was about 70 degrees and patella tracked normally.  Tourniquet was released.  Total time of 117 minutes. Minor bleeding stopped with cautery.  Now, the drain was placed in the subcutaneous space.  Prior to this, we  had injected with 2 g of TXA mixed with 50 mL saline.  Subcu was then closed with interrupted 2-0 Vicryl and skin with staples.  Drains hooked to suction.  Incision was cleaned and dried and bulky sterile dressing applied.  She was then placed into a knee immobilizer, awakened and transported to recovery in stable condition.  Note, that a surgical assistant is a medical necessity for this procedure to do it in a safe and expeditious manner.  Surgical assistance necessary for protection and retraction of vital neurovascular structures and ligaments and for proper positioning of the limb for accurate placement of the prosthesis.     [name], M.D.     FA/MEDQ  D:  05/23/2015  T:  05/24/2015  Job:  [id number]

## 2015-05-24 NOTE — Clinical Social Work Note (Signed)
Clinical Social Work Assessment  Patient Details  Name: Beth [name] MRN: [id number] Date of Birth: 02-12-1946  Date of referral:  05/24/15               Reason for consult:  Discharge Planning, Facility Placement                Permission sought to share information with:    Permission granted to share information::     Name::        Agency::     Relationship::     Contact Information:     Housing/Transportation Living arrangements for the past 2 months:  Single Family Home Source of Information:  Patient Patient Interpreter Needed:  None Criminal Activity/Legal Involvement Pertinent to Current Situation/Hospitalization:  No - Comment as needed Significant Relationships:  Adult Children Lives with:  Self Do you feel safe going back to the place where you live?   (ST Rehab may be needed.) Need for family participation in patient care:  No (Coment)  Care giving concerns:  Pt feels she will need more care at home than available following hospital d/c.   Social Worker assessment / plan:  Pt hospitalized on 05/23/15 for pre plan right total knee reimplantation. CSW consulted for assistance with d/c planning. ST Rehab may be needed at d/c. Pt has been to [facility] in the past and would like to return for rehab. CSW has contacted [facility] and SNF is able to offer a rehab bed. MD will see pt in am and will determine disposition. CSW will assist with rehab placement if needed.  Employment status:  Retired Nurse, adult PT Recommendations:  Not assessed at this time Information / Referral to community resources:  [place]  Patient/Family's Response to care Pt feels ST Rehab is needed.    Patient/Family's Understanding of and Emotional Response to Diagnosis, Current Treatment, and Prognosis:  Pt is concerned about going home following surgery. Pt progressed well in rehab following her last surgery. Pt is motivated to work with therapy. Pt is  hopeful she will she will have the opportunity to return to [facility] for rehab at d/c.  Emotional Assessment Appearance:  Appears stated age Attitude/Demeanor/Rapport:  Other (cooperative) Affect (typically observed):  Calm, Accepting, Appropriate, Pleasant Orientation:  Oriented to Self, Oriented to Place, Oriented to  Time, Oriented to Situation Alcohol / Substance use:  Not Applicable Psych involvement (Current and /or in the community):  No (Comment)  Discharge Needs  Concerns to be addressed:  Discharge Planning Concerns Readmission within the last 30 days:  No Current discharge risk:  None Barriers to Discharge:  No Barriers Identified   Coyle Stordahl, Randall An, LCSW 05/24/2015, 2:09 PM

## 2015-05-24 NOTE — Evaluation (Signed)
Physical Therapy Evaluation Patient Details Name: Beth [name] MRN: [id number] DOB: [birth year] Today's Date: 05/24/2015   History of Present Illness  Reimplantation R TKR; s/p spacer placement 3/16  Clinical Impression  Pt s/p R TKR reimplantation presents with decreased R LE strength/ROM, post op pain and PWB status limiting functional mobility.  Pt would benefit from follow up rehab at SNF level to maximize IND and safety prior to return home with ltd assist.    Follow Up Recommendations SNF    Equipment Recommendations  None recommended by PT    Recommendations for Other Services OT consult     Precautions / Restrictions Precautions Precautions: Knee;Fall Required Braces or Orthoses: Knee Immobilizer - Right Knee Immobilizer - Right: Discontinue once straight leg raise with < 10 degree lag Restrictions Weight Bearing Restrictions: Yes RLE Weight Bearing: Partial weight bearing Other Position/Activity Restrictions: NO ROM at knee past 60 degrees      Mobility  Bed Mobility Overal bed mobility: Needs Assistance Bed Mobility: Supine to Sit     Supine to sit: Min assist     General bed mobility comments: cues for sequence and use of L LE to self assist  Transfers Overall transfer level: Needs assistance Equipment used: Rolling walker (2 wheeled) Transfers: Sit to/from Stand Sit to Stand: Min assist;Mod assist         General transfer comment: cues for LE management and use of UEs to self assist  Ambulation/Gait Ambulation/Gait assistance: Min assist;Mod assist Ambulation Distance (Feet): 28 Feet Assistive device: Rolling walker (2 wheeled) Gait Pattern/deviations: Step-to pattern;Decreased step length - right;Decreased step length - left;Shuffle;Trunk flexed Gait velocity: decr   General Gait Details: cues for sequence, posture, position from RW and PWB  Stairs            Wheelchair Mobility    Modified Rankin (Stroke Patients Only)        Balance                                             Pertinent Vitals/Pain Pain Assessment: 0-10 Pain Score: 5  Pain Location: R knee Pain Descriptors / Indicators: Aching;Sore Pain Intervention(s): Limited activity within patient's tolerance;Monitored during session;Premedicated before session;Ice applied    Home Living Family/patient expects to be discharged to:: Skilled nursing facility Living Arrangements: Alone                    Prior Function Level of Independence: Independent with assistive device(s)         Comments: using cane prior to spacer     Hand Dominance        Extremity/Trunk Assessment   Upper Extremity Assessment: Overall WFL for tasks assessed           Lower Extremity Assessment: RLE deficits/detail RLE Deficits / Details: 2/5 quads with AAROM at knee -15 - 35       Communication   Communication: No difficulties  Cognition Arousal/Alertness: Awake/alert Behavior During Therapy: WFL for tasks assessed/performed Overall Cognitive Status: Within Functional Limits for tasks assessed                      General Comments      Exercises Total Joint Exercises Ankle Circles/Pumps: AROM;Both;15 reps;Supine Quad Sets: AROM;Both;10 reps;Supine Heel Slides: AAROM;Right;10 reps;Supine Straight Leg Raises: AAROM;Right;10 reps;Supine      Assessment/Plan  PT Assessment Patient needs continued PT services  PT Diagnosis Difficulty walking   PT Problem List Decreased strength;Decreased range of motion;Decreased activity tolerance;Decreased mobility;Decreased knowledge of use of DME;Pain  PT Treatment Interventions DME instruction;Gait training;Stair training;Functional mobility training;Therapeutic activities;Therapeutic exercise;Patient/family education   PT Goals (Current goals can be found in the Care Plan section) Acute Rehab PT Goals Patient Stated Goal: Walk without pain PT Goal Formulation: With  patient Time For Goal Achievement: 05/31/15 Potential to Achieve Goals: Good    Frequency 7X/week   Barriers to discharge        Co-evaluation               End of Session Equipment Utilized During Treatment: Gait belt;Right knee immobilizer Activity Tolerance: Patient tolerated treatment well Patient left: in chair;with call bell/phone within reach Nurse Communication: Mobility status         Time: 0626-9485 PT Time Calculation (min) (ACUTE ONLY): 28 min   Charges:   PT Evaluation $Initial PT Evaluation Tier I: 1 Procedure PT Treatments $Therapeutic Exercise: 8-22 mins   PT G Codes:        Deyani Hegarty 06/03/15, 3:01 PM

## 2015-05-25 DIAGNOSIS — E440 Moderate protein-calorie malnutrition: Secondary | ICD-10-CM | POA: Insufficient documentation

## 2015-05-25 DIAGNOSIS — D62 Acute posthemorrhagic anemia: Secondary | ICD-10-CM | POA: Diagnosis not present

## 2015-05-25 LAB — BASIC METABOLIC PANEL
Anion gap: 6 (ref 5–15)
BUN: 30 mg/dL — AB (ref 6–20)
CHLORIDE: 99 mmol/L — AB (ref 101–111)
CO2: 27 mmol/L (ref 22–32)
Calcium: 8.3 mg/dL — ABNORMAL LOW (ref 8.9–10.3)
Creatinine, Ser: 1.15 mg/dL — ABNORMAL HIGH (ref 0.44–1.00)
GFR calc Af Amer: 54 mL/min — ABNORMAL LOW (ref >60)
GFR calc non Af Amer: 47 mL/min — ABNORMAL LOW (ref >60)
Glucose, Bld: 194 mg/dL — ABNORMAL HIGH (ref 65–99)
POTASSIUM: 4.3 mmol/L (ref 3.5–5.1)
Sodium: 132 mmol/L — ABNORMAL LOW (ref 135–145)

## 2015-05-25 LAB — PREPARE RBC (CROSSMATCH)

## 2015-05-25 LAB — CBC
HEMATOCRIT: 22.7 % — AB (ref 36.0–46.0)
Hemoglobin: 7.1 g/dL — ABNORMAL LOW (ref 12.0–15.0)
MCH: 23.8 pg — AB (ref 26.0–34.0)
MCHC: 31.3 g/dL (ref 30.0–36.0)
MCV: 76.2 fL — ABNORMAL LOW (ref 78.0–100.0)
Platelets: 298 10*3/uL (ref 150–400)
RBC: 2.98 MIL/uL — ABNORMAL LOW (ref 3.87–5.11)
RDW: 18.6 % — AB (ref 11.5–15.5)
WBC: 11.7 10*3/uL — AB (ref 4.0–10.5)

## 2015-05-25 MED ORDER — SODIUM CHLORIDE 0.9 % IV SOLN
Freq: Once | INTRAVENOUS | Status: AC
  Administered 2015-05-25: 09:00:00 via INTRAVENOUS

## 2015-05-25 NOTE — Progress Notes (Signed)
   Subjective: 2 Days Post-Op Procedure(s) (LRB): RIGHT KNEE ARTHROPLASTY REIMPLANTATION (Right) Patient reports pain as moderate.    Plan is to go Skilled nursing facility after hospital stay.  Objective: Vital signs in last 24 hours: Temp:  [97.6 F (36.4 C)-98 F (36.7 C)] 97.8 F (36.6 C) (06/17 0505) Pulse Rate:  [64-73] 64 (06/17 0505) Resp:  [16] 16 (06/17 0505) BP: (114-141)/(63-74) 125/63 mmHg (06/17 0505) SpO2:  [95 %-100 %] 97 % (06/17 0505)  Intake/Output from previous day:  Intake/Output Summary (Last 24 hours) at 05/25/15 0831 Last data filed at 05/25/15 0500  Gross per 24 hour  Intake    120 ml  Output    580 ml  Net   -460 ml    Intake/Output this shift:    Labs:  Recent Labs  05/24/15 0418 05/25/15 0420  HGB 8.5* 7.1*    Recent Labs  05/24/15 0418 05/25/15 0420  WBC 9.4 11.7*  RBC 3.54* 2.98*  HCT 26.8* 22.7*  PLT 347 298    Recent Labs  05/24/15 0418 05/25/15 0420  NA 135 132*  K 4.5 4.3  CL 101 99*  CO2 25 27  BUN 23* 30*  CREATININE 1.13* 1.15*  GLUCOSE 244* 194*  CALCIUM 8.4* 8.3*   No results for input(s): LABPT, INR in the last 72 hours.  EXAM General - Patient is Alert, Appropriate and Oriented Extremity - Neurologically intact Neurovascular intact No cellulitis present Compartment soft Dressing/Incision - clean, dry, no drainage Motor Function - intact, moving foot and toes well on exam.   Past Medical History  Diagnosis Date  . Hypertension   . CVA (cerebral infarction)     2006  . Hyperlipidemia   . Blood transfusion without reported diagnosis 2012    anemia;pt denies transfusion stated was only on iron tablet  . Heart murmur   . Nocturia     3-4 times per night  . Gout     left elbow  . Arthritis     Knee both knees  . Anemia   . 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 08-09-14 Right eye  . Family history of anesthesia complication     sister very slow to awaken after anesthesia;severe  vomiting   . Stroke 2006    x 1 no deficits noted     Assessment/Plan: 2 Days Post-Op Procedure(s) (LRB): RIGHT KNEE ARTHROPLASTY REIMPLANTATION (Right) Active Problems:   OA (osteoarthritis) of knee   Malnutrition of moderate degree Blood loss anemia- Transfuse 2 units PRBCs today  Plan for discharge tomorrow  DVT Prophylaxis - Xarelto Weight-Bearing as tolerated to right leg  Beth [name] V 05/25/2015, 8:31 AM

## 2015-05-25 NOTE — Discharge Summary (Signed)
Physician Discharge Summary   Patient ID: Beth [name] MRN: [id number] DOB/AGE: [birth year] 69 y.o.  Admit date: 05/23/2015 Discharge date: 05/26/2015  Primary Diagnosis: Failed right total knee arthroplasty  Admission Diagnoses:  Past Medical History  Diagnosis Date  . Hypertension   . CVA (cerebral infarction)     2006  . Hyperlipidemia   . Blood transfusion without reported diagnosis 2012    anemia;pt denies transfusion stated was only on iron tablet  . Heart murmur   . Nocturia     3-4 times per night  . Gout     left elbow  . Arthritis     Knee both knees  . Anemia   . 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 08-09-14 Right eye  . Family history of anesthesia complication     sister very slow to awaken after anesthesia;severe vomiting   . Stroke 2006    x 1 no deficits noted    Discharge Diagnoses:   Active Problems:   OA (osteoarthritis) of knee   Malnutrition of moderate degree   Postoperative anemia due to acute blood loss  Estimated body mass index is 31.13 kg/(m^2) as calculated from the following:   Height as of this encounter: 5' 5.5" (1.664 m).   Weight as of this encounter: 86.183 kg (190 lb).  Procedure:  Procedure(s) (LRB): RIGHT KNEE ARTHROPLASTY REIMPLANTATION (Right)   Consults: None  HPI: The patient is a 69 year old female presenting 3 months postop following resection arthroplasty with placement of antibiotic spacer. Overall the patient feels that the right knee is doing well. The patient does report pain and swelling The patient does indicate that these symptoms are improving. Pain medications include: Oxycodone (and Robaxin prn) . The patient is currently with the assistance of: wheelchair. The patient is using a straight leg immobilizer. Her Sed Rate and C-reactive protein have been improving. She is now ready for reimplantation surgery.  Risks and benefits of the surgery have been discussed with the patient and they elect to proceed  with surgery. There are on active contraindications to upcoming procedure such as progressive neurological disease.   Laboratory Data: Admission on 05/23/2015  Component Date Value Ref Range Status  . ABO/RH(D) 05/23/2015 O POS   Final  . Antibody Screen 05/23/2015 NEG   Final  . Sample Expiration 05/23/2015 05/26/2015   Final  . Unit Number 05/23/2015 [id number]   Final  . Blood Component Type 05/23/2015 RED CELLS,LR   Final  . Unit division 05/23/2015 00   Final  . Status of Unit 05/23/2015 ISSUED,FINAL   Final  . Transfusion Status 05/23/2015 OK TO TRANSFUSE   Final  . Crossmatch Result 05/23/2015 Compatible   Final  . Unit Number 05/23/2015 [id number]   Final  . Blood Component Type 05/23/2015 RED CELLS,LR   Final  . Unit division 05/23/2015 00   Final  . Status of Unit 05/23/2015 ALLOCATED   Final  . Transfusion Status 05/23/2015 OK TO TRANSFUSE   Final  . Crossmatch Result 05/23/2015 Compatible   Final  . Unit Number 05/23/2015 [id number]   Final  . Blood Component Type 05/23/2015 RBC LR PHER1   Final  . Unit division 05/23/2015 00   Final  . Status of Unit 05/23/2015 ALLOCATED   Final  . Transfusion Status 05/23/2015 OK TO TRANSFUSE   Final  . Crossmatch Result 05/23/2015 Compatible   Final  . Specimen Description 05/23/2015 SYNOVIAL FLUID RIGHT KNEE   Final  .  Special Requests 05/23/2015 NONE   Final  . Gram Stain 05/23/2015    Final                   Value:ABUNDANT WBC PRESENT, PREDOMINANTLY PMN NO ORGANISMS SEEN Gram Stain Report Called to,Read Back By and Verified With: Colen Darling [phone] @ [facility]   . Report Status 05/23/2015 05/23/2015 FINAL   Final  . Specimen Description 05/23/2015 SYNOVIAL FLUID RIGHT KNEE   Final  . Special Requests 05/23/2015 NONE   Final  . Gram Stain 05/23/2015    Final                   Value:ABUNDANT WBC PRESENT, PREDOMINANTLY PMN NO ORGANISMS SEEN Gram Stain Report Called to,Read Back By and Verified With: Celedonio Miyamoto, RN [phone] @ [address]   . Culture 05/23/2015    Final                   Value:NO GROWTH < 24 HOURS Performed at [facility]   . Report Status 05/23/2015 PENDING   Incomplete  . Order Confirmation 05/23/2015 ORDER PROCESSED BY BLOOD BANK   Final  . WBC 05/24/2015 9.4  4.0 - 10.5 K/uL Final  . RBC 05/24/2015 3.54* 3.87 - 5.11 MIL/uL Final  . Hemoglobin 05/24/2015 8.5* 12.0 - 15.0 g/dL Final  . HCT 05/24/2015 26.8* 36.0 - 46.0 % Final  . MCV 05/24/2015 75.7* 78.0 - 100.0 fL Final  . MCH 05/24/2015 24.0* 26.0 - 34.0 pg Final  . MCHC 05/24/2015 31.7  30.0 - 36.0 g/dL Final  . RDW 05/24/2015 18.3* 11.5 - 15.5 % Final  . Platelets 05/24/2015 347  150 - 400 K/uL Final  . Sodium 05/24/2015 135  135 - 145 mmol/L Final  . Potassium 05/24/2015 4.5  3.5 - 5.1 mmol/L Final  . Chloride 05/24/2015 101  101 - 111 mmol/L Final  . CO2 05/24/2015 25  22 - 32 mmol/L Final  . Glucose, Bld 05/24/2015 244* 65 - 99 mg/dL Final  . BUN 05/24/2015 23* 6 - 20 mg/dL Final  . Creatinine, Ser 05/24/2015 1.13* 0.44 - 1.00 mg/dL Final  . Calcium 05/24/2015 8.4* 8.9 - 10.3 mg/dL Final  . GFR calc non Af Amer 05/24/2015 48* >60 mL/min Final  . GFR calc Af Amer 05/24/2015 55* >60 mL/min Final   Comment: (NOTE) The eGFR has been calculated using the CKD EPI equation. This calculation has not been validated in all clinical situations. eGFR's persistently <60 mL/min signify possible Chronic Kidney Disease.   . Anion gap 05/24/2015 9  5 - 15 Final  . WBC 05/25/2015 11.7* 4.0 - 10.5 K/uL Final  . RBC 05/25/2015 2.98* 3.87 - 5.11 MIL/uL Final  . Hemoglobin 05/25/2015 7.1* 12.0 - 15.0 g/dL Final  . HCT 05/25/2015 22.7* 36.0 - 46.0 % Final  . MCV 05/25/2015 76.2* 78.0 - 100.0 fL Final  . MCH 05/25/2015 23.8* 26.0 - 34.0 pg Final  . MCHC 05/25/2015 31.3  30.0 - 36.0 g/dL Final  . RDW 05/25/2015 18.6* 11.5 - 15.5 % Final  . Platelets 05/25/2015 298  150 - 400 K/uL Final  . Sodium 05/25/2015 132*  135 - 145 mmol/L Final  . Potassium 05/25/2015 4.3  3.5 - 5.1 mmol/L Final  . Chloride 05/25/2015 99* 101 - 111 mmol/L Final  . CO2 05/25/2015 27  22 - 32 mmol/L Final  . Glucose, Bld 05/25/2015 194* 65 - 99 mg/dL Final  .  BUN 05/25/2015 30* 6 - 20 mg/dL Final  . Creatinine, Ser 05/25/2015 1.15* 0.44 - 1.00 mg/dL Final  . Calcium 05/25/2015 8.3* 8.9 - 10.3 mg/dL Final  . GFR calc non Af Amer 05/25/2015 47* >60 mL/min Final  . GFR calc Af Amer 05/25/2015 54* >60 mL/min Final   Comment: (NOTE) The eGFR has been calculated using the CKD EPI equation. This calculation has not been validated in all clinical situations. eGFR's persistently <60 mL/min signify possible Chronic Kidney Disease.   Georgiann Hahn gap 05/25/2015 6  5 - 15 Final  Hospital Outpatient Visit on 04/30/2015  Component Date Value Ref Range Status  . aPTT 04/30/2015 31  24 - 37 seconds Final  . WBC 04/30/2015 8.1  4.0 - 10.5 K/uL Final  . RBC 04/30/2015 4.78  3.87 - 5.11 MIL/uL Final  . Hemoglobin 04/30/2015 10.5* 12.0 - 15.0 g/dL Final  . HCT 04/30/2015 35.5* 36.0 - 46.0 % Final  . MCV 04/30/2015 74.3* 78.0 - 100.0 fL Final  . MCH 04/30/2015 22.0* 26.0 - 34.0 pg Final  . MCHC 04/30/2015 29.6* 30.0 - 36.0 g/dL Final  . RDW 04/30/2015 16.7* 11.5 - 15.5 % Final  . Platelets 04/30/2015 402* 150 - 400 K/uL Final  . Sodium 04/30/2015 136  135 - 145 mmol/L Final  . Potassium 04/30/2015 3.9  3.5 - 5.1 mmol/L Final  . Chloride 04/30/2015 99* 101 - 111 mmol/L Final  . CO2 04/30/2015 29  22 - 32 mmol/L Final  . Glucose, Bld 04/30/2015 152* 65 - 99 mg/dL Final  . BUN 04/30/2015 18  6 - 20 mg/dL Final  . Creatinine, Ser 04/30/2015 1.00  0.44 - 1.00 mg/dL Final  . Calcium 04/30/2015 9.3  8.9 - 10.3 mg/dL Final  . Total Protein 04/30/2015 8.3* 6.5 - 8.1 g/dL Final  . Albumin 04/30/2015 3.4* 3.5 - 5.0 g/dL Final  . AST 04/30/2015 27  15 - 41 U/L Final  . ALT 04/30/2015 31  14 - 54 U/L Final  . Alkaline Phosphatase 04/30/2015 126  38 -  126 U/L Final  . Total Bilirubin 04/30/2015 0.6  0.3 - 1.2 mg/dL Final  . GFR calc non Af Amer 04/30/2015 55* >60 mL/min Final  . GFR calc Af Amer 04/30/2015 >60  >60 mL/min Final   Comment: (NOTE) The eGFR has been calculated using the CKD EPI equation. This calculation has not been validated in all clinical situations. eGFR's persistently <60 mL/min signify possible Chronic Kidney Disease.   . Anion gap 04/30/2015 8  5 - 15 Final  . Prothrombin Time 04/30/2015 14.9  11.6 - 15.2 seconds Final  . INR 04/30/2015 1.16  0.00 - 1.49 Final  . ABO/RH(D) 04/30/2015 O POS   Final  . Antibody Screen 04/30/2015 NEG   Final  . Sample Expiration 04/30/2015 05/07/2015   Final  . Color, Urine 04/30/2015 YELLOW  YELLOW Final  . APPearance 04/30/2015 CLOUDY* CLEAR Final  . Specific Gravity, Urine 04/30/2015 1.016  1.005 - 1.030 Final  . pH 04/30/2015 6.5  5.0 - 8.0 Final  . Glucose, UA 04/30/2015 NEGATIVE  NEGATIVE mg/dL Final  . Hgb urine dipstick 04/30/2015 NEGATIVE  NEGATIVE Final  . Bilirubin Urine 04/30/2015 NEGATIVE  NEGATIVE Final  . Ketones, ur 04/30/2015 NEGATIVE  NEGATIVE mg/dL Final  . Protein, ur 04/30/2015 NEGATIVE  NEGATIVE mg/dL Final  . Urobilinogen, UA 04/30/2015 1.0  0.0 - 1.0 mg/dL Final  . Nitrite 04/30/2015 NEGATIVE  NEGATIVE Final  . Leukocytes, UA 04/30/2015 NEGATIVE  NEGATIVE Final   MICROSCOPIC NOT DONE ON URINES WITH NEGATIVE PROTEIN, BLOOD, LEUKOCYTES, NITRITE, OR GLUCOSE <1000 mg/dL.  Marland Kitchen MRSA, PCR 04/30/2015 NEGATIVE  NEGATIVE Final  . Staphylococcus aureus 04/30/2015 NEGATIVE  NEGATIVE Final   Comment:        The Xpert SA Assay (FDA approved for NASAL specimens in patients over 9 years of age), is one component of a comprehensive surveillance program.  Test performance has been validated by [facility] for patients greater than or equal to 16 year old. It is not intended to diagnose infection nor to guide or monitor treatment.   Office Visit on 04/18/2015    Component Date Value Ref Range Status  . WBC 04/18/2015 8.9  3.4 - 10.8 x10E3/uL Final  . RBC 04/18/2015 4.49  3.77 - 5.28 x10E6/uL Final  . Hemoglobin 04/18/2015 10.1* 11.1 - 15.9 g/dL Final  . Hematocrit 04/18/2015 33.5* 34.0 - 46.6 % Final  . MCV 04/18/2015 75* 79 - 97 fL Final  . MCH 04/18/2015 22.5* 26.6 - 33.0 pg Final  . MCHC 04/18/2015 30.1* 31.5 - 35.7 g/dL Final  . RDW 04/18/2015 16.6* 12.3 - 15.4 % Final  . Platelets 04/18/2015 467* 150 - 379 x10E3/uL Final  . NEUTROPHILS 04/18/2015 71   Final  . Lymphs 04/18/2015 17   Final  . Monocytes 04/18/2015 9   Final  . Eos 04/18/2015 2   Final  . Basos 04/18/2015 1   Final  . Neutrophils Absolute 04/18/2015 6.4  1.4 - 7.0 x10E3/uL Final  . Lymphocytes Absolute 04/18/2015 1.5  0.7 - 3.1 x10E3/uL Final  . Monocytes Absolute 04/18/2015 0.8  0.1 - 0.9 x10E3/uL Final  . EOS (ABSOLUTE) 04/18/2015 0.2  0.0 - 0.4 x10E3/uL Final  . Basophils Absolute 04/18/2015 0.1  0.0 - 0.2 x10E3/uL Final  . Immature Granulocytes 04/18/2015 0   Final  . Immature Grans (Abs) 04/18/2015 0.0  0.0 - 0.1 x10E3/uL Final       Hospital Course: Beth [name] is a 69 y.o. who was admitted to [facility]. They were brought to the operating room on 05/23/2015 and underwent Procedure(s): RIGHT Ridge.  Patient tolerated the procedure well and was later transferred to the recovery room and then to the orthopaedic floor for postoperative care.  They were given PO and IV analgesics for pain control following their surgery.  They were given 24 hours of postoperative antibiotics of  Anti-infectives    Start     Dose/Rate Route Frequency Ordered Stop   05/24/15 0600  ceFAZolin (ANCEF) IVPB 2 g/50 mL premix     2 g 100 mL/hr over 30 Minutes Intravenous On call to O.R. 05/23/15 1325 05/23/15 1544   05/23/15 2330  ceFAZolin (ANCEF) IVPB 2 g/50 mL premix     2 g 100 mL/hr over 30 Minutes Intravenous Every 6 hours 05/23/15 2231 05/24/15  0641     and started on DVT prophylaxis in the form of Xarelto.   PT and OT were ordered for total joint protocol.  Discharge planning consulted to help with postop disposition and equipment needs.  Patient had a fair night on the evening of surgery.  They started to get up OOB with therapy on day one. Hemovac drain was pulled without difficulty.  Dressing was changed on day two and the incision was clean and dry. She had symptomatic postoperative anemia requiring two units of blood post op day two. Plan for DC to SNF post op day three.  Diet: Cardiac diet Activity:WBAT; no flexion greater than 70 degrees.  Follow-up:in 2 weeks Disposition - Skilled nursing facility Discharged Condition: stable   Discharge Instructions    Call MD / Call 911    Complete by:  As directed   If you experience chest pain or shortness of breath, CALL 911 and be transported to the hospital emergency room.  If you develope a fever above 101 F, pus (white drainage) or increased drainage or redness at the wound, or calf pain, call your surgeon's office.     Constipation Prevention    Complete by:  As directed   Drink plenty of fluids.  Prune juice may be helpful.  You may use a stool softener, such as Colace (over the counter) 100 mg twice a day.  Use MiraLax (over the counter) for constipation as needed.     Diet - low sodium heart healthy    Complete by:  As directed      Increase activity slowly as tolerated    Complete by:  As directed             Medication List    TAKE these medications        amLODipine 10 MG tablet  Commonly known as:  NORVASC  Take 1 tablet (10 mg total) by mouth every evening.     atorvastatin 20 MG tablet  Commonly known as:  LIPITOR  Take 1 tablet (20 mg total) by mouth every evening.     ferrous fumarate 325 (106 FE) MG Tabs tablet  Commonly known as:  HEMOCYTE  Take 1 tablet (106 mg of iron total) by mouth 2 (two) times daily.     methocarbamol 500 MG tablet   Commonly known as:  ROBAXIN  Take 1 tablet (500 mg total) by mouth every 6 (six) hours as needed for muscle spasms.     nebivolol 10 MG tablet  Commonly known as:  BYSTOLIC  Take 20 mg by mouth daily. Patient takes in the evening     olmesartan-hydrochlorothiazide 40-12.5 MG per tablet  Commonly known as:  BENICAR HCT  Take 1 tablet by mouth every evening.     oxyCODONE 5 MG immediate release tablet  Commonly known as:  Oxy IR/ROXICODONE  Take 1-2 tablets (5-10 mg total) by mouth every 3 (three) hours as needed for breakthrough pain.     rivaroxaban 10 MG Tabs tablet  Commonly known as:  XARELTO  Take 1 tablet (10 mg total) by mouth daily with breakfast.     traMADol 50 MG tablet  Commonly known as:  ULTRAM  Take 1-2 tablets (50-100 mg total) by mouth every 6 (six) hours as needed for moderate pain.           Follow-up Information    Follow up with Gearlean Alf, MD. Schedule an appointment as soon as possible for a visit on 06/05/2015.   Specialty:  Orthopedic Surgery   Why:  Call [phone] Monday to make the appointment   Contact information:   [address] [place] [address] [phone]       Signed: Ardeen Jourdain, PA-C Orthopaedic Surgery 05/25/2015, 8:34 AM

## 2015-05-25 NOTE — Progress Notes (Signed)
PT Cancellation Note  Patient Details Name: Beth [name] MRN: [id number] DOB: 12-Feb-1946   Cancelled Treatment:     PT deferred this am 2* pt with Hgb of 7.1 with transfusion pending.  Deferred this pm at pt request 2* fatigue.  Will follow.   [name] 05/25/2015, 4:58 PM

## 2015-05-25 NOTE — Progress Notes (Signed)
Pt has SNF bed at [facility] on SAT if stable for d/c. Week end CSW will assist with d/c planning to SNF.  Werner Lean LCSW [phone]

## 2015-05-25 NOTE — Progress Notes (Signed)
OT Note  Patient Details Name: Beth [name] MRN: [id number] DOB: [birth year]   Cancelled Treatment:    Reason Eval/Treat Not Completed: OT screened, no needs identified, will sign off  Educated  Pt regarding OT role.  Pt has had multiple surgeries and feels she does not need OT this admission. Pt has 3 n 1 and [place], [place]  Betsy Pries 05/25/2015, 9:41 AM

## 2015-05-26 LAB — CBC
HCT: 29.1 % — ABNORMAL LOW (ref 36.0–46.0)
Hemoglobin: 9.2 g/dL — ABNORMAL LOW (ref 12.0–15.0)
MCH: 24.6 pg — ABNORMAL LOW (ref 26.0–34.0)
MCHC: 31.6 g/dL (ref 30.0–36.0)
MCV: 77.8 fL — ABNORMAL LOW (ref 78.0–100.0)
Platelets: 278 10*3/uL (ref 150–400)
RBC: 3.74 MIL/uL — ABNORMAL LOW (ref 3.87–5.11)
RDW: 18 % — ABNORMAL HIGH (ref 11.5–15.5)
WBC: 10.2 10*3/uL (ref 4.0–10.5)

## 2015-05-26 LAB — BODY FLUID CULTURE: Culture: NO GROWTH

## 2015-05-26 NOTE — Progress Notes (Signed)
Patient discharged to [facility] with daughter as transportation, discharge instructions reviewed with patient who verbalized understanding.

## 2015-05-26 NOTE — Discharge Summary (Signed)
Physician Discharge Summary   Patient ID: Beth [name] MRN: [id number] DOB/AGE: 71-Feb-1947 71 y.o.  Admit date: 05/23/2015 Discharge date: 05/26/2015  Admission Diagnoses:  Active Problems:   OA (osteoarthritis) of knee   Malnutrition of moderate degree   Postoperative anemia due to acute blood loss   Discharge Diagnoses:  Same   Surgeries: Procedure(s): RIGHT KNEE ARTHROPLASTY REIMPLANTATION on 05/23/2015   Consultants: PT/OT  Discharged Condition: Stable  Hospital Course: Beth [name] is an 69 y.o. female who was admitted 05/23/2015 with a chief complaint of No chief complaint on file. , and found to have a diagnosis of <principal problem not specified>.  They were brought to the operating room on 05/23/2015 and underwent the above named procedures.    The patient had an uncomplicated hospital course and was stable for discharge.  Recent vital signs:  Filed Vitals:   05/26/15 0431  BP: 148/79  Pulse: 74  Temp: 98.5 F (36.9 C)  Resp: 16    Recent laboratory studies:  Results for orders placed or performed during the hospital encounter of 05/23/15  Anaerobic culture  Result Value Ref Range   Specimen Description SYNOVIAL FLUID RIGHT KNEE    Special Requests NONE    Gram Stain      FEW WBC PRESENT,BOTH PMN AND MONONUCLEAR NO ORGANISMS SEEN Performed at [facility]    Culture      NO ANAEROBES ISOLATED; CULTURE IN PROGRESS FOR 5 DAYS Performed at [facility]    Report Status PENDING   Gram stain  Result Value Ref Range   Specimen Description SYNOVIAL FLUID RIGHT KNEE    Special Requests NONE    Gram Stain      ABUNDANT WBC PRESENT, PREDOMINANTLY PMN NO ORGANISMS SEEN Gram Stain Report Called to,Read Back By and Verified With: Colen Darling [phone] @ [place]    Report Status 05/23/2015 FINAL   Body fluid culture  Result Value Ref Range   Specimen Description SYNOVIAL FLUID RIGHT KNEE    Special Requests NONE    Gram Stain       ABUNDANT WBC PRESENT, PREDOMINANTLY PMN NO ORGANISMS SEEN Gram Stain Report Called to,Read Back By and Verified With: Celedonio Miyamoto, RN [phone] @ [place]    Culture      NO GROWTH 2 DAYS Performed at [facility]    Report Status PENDING   CBC  Result Value Ref Range   WBC 9.4 4.0 - 10.5 K/uL   RBC 3.54 (L) 3.87 - 5.11 MIL/uL   Hemoglobin 8.5 (L) 12.0 - 15.0 g/dL   HCT 26.8 (L) 36.0 - 46.0 %   MCV 75.7 (L) 78.0 - 100.0 fL   MCH 24.0 (L) 26.0 - 34.0 pg   MCHC 31.7 30.0 - 36.0 g/dL   RDW 18.3 (H) 11.5 - 15.5 %   Platelets 347 150 - 400 K/uL  Basic metabolic panel  Result Value Ref Range   Sodium 135 135 - 145 mmol/L   Potassium 4.5 3.5 - 5.1 mmol/L   Chloride 101 101 - 111 mmol/L   CO2 25 22 - 32 mmol/L   Glucose, Bld 244 (H) 65 - 99 mg/dL   BUN 23 (H) 6 - 20 mg/dL   Creatinine, Ser 1.13 (H) 0.44 - 1.00 mg/dL   Calcium 8.4 (L) 8.9 - 10.3 mg/dL   GFR calc non Af Amer 48 (L) >60 mL/min   GFR calc Af Amer 55 (L) >60 mL/min  Anion gap 9 5 - 15  CBC  Result Value Ref Range   WBC 11.7 (H) 4.0 - 10.5 K/uL   RBC 2.98 (L) 3.87 - 5.11 MIL/uL   Hemoglobin 7.1 (L) 12.0 - 15.0 g/dL   HCT 22.7 (L) 36.0 - 46.0 %   MCV 76.2 (L) 78.0 - 100.0 fL   MCH 23.8 (L) 26.0 - 34.0 pg   MCHC 31.3 30.0 - 36.0 g/dL   RDW 18.6 (H) 11.5 - 15.5 %   Platelets 298 150 - 400 K/uL  Basic metabolic panel  Result Value Ref Range   Sodium 132 (L) 135 - 145 mmol/L   Potassium 4.3 3.5 - 5.1 mmol/L   Chloride 99 (L) 101 - 111 mmol/L   CO2 27 22 - 32 mmol/L   Glucose, Bld 194 (H) 65 - 99 mg/dL   BUN 30 (H) 6 - 20 mg/dL   Creatinine, Ser 1.15 (H) 0.44 - 1.00 mg/dL   Calcium 8.3 (L) 8.9 - 10.3 mg/dL   GFR calc non Af Amer 47 (L) >60 mL/min   GFR calc Af Amer 54 (L) >60 mL/min   Anion gap 6 5 - 15  CBC  Result Value Ref Range   WBC 10.2 4.0 - 10.5 K/uL   RBC 3.74 (L) 3.87 - 5.11 MIL/uL   Hemoglobin 9.2 (L) 12.0 - 15.0 g/dL   HCT 29.1 (L) 36.0 - 46.0 %   MCV 77.8 (L) 78.0 - 100.0 fL     MCH 24.6 (L) 26.0 - 34.0 pg   MCHC 31.6 30.0 - 36.0 g/dL   RDW 18.0 (H) 11.5 - 15.5 %   Platelets 278 150 - 400 K/uL  Type and screen  Result Value Ref Range   ABO/RH(D) O POS    Antibody Screen NEG    Sample Expiration 05/26/2015    Unit Number [id number]    Blood Component Type RED CELLS,LR    Unit division 00    Status of Unit ISSUED,FINAL    Transfusion Status OK TO TRANSFUSE    Crossmatch Result Compatible    Unit Number [id number]    Blood Component Type RED CELLS,LR    Unit division 00    Status of Unit ISSUED    Transfusion Status OK TO TRANSFUSE    Crossmatch Result Compatible    Unit Number [id number]    Blood Component Type RBC LR PHER1    Unit division 00    Status of Unit ISSUED    Transfusion Status OK TO TRANSFUSE    Crossmatch Result Compatible   Prepare RBC  Result Value Ref Range   Order Confirmation ORDER PROCESSED BY BLOOD BANK   Prepare RBC  Result Value Ref Range   Order Confirmation ORDER PROCESSED BY BLOOD BANK     Discharge Medications:     Medication List    TAKE these medications        amLODipine 10 MG tablet  Commonly known as:  NORVASC  Take 1 tablet (10 mg total) by mouth every evening.     atorvastatin 20 MG tablet  Commonly known as:  LIPITOR  Take 1 tablet (20 mg total) by mouth every evening.     ferrous fumarate 325 (106 FE) MG Tabs tablet  Commonly known as:  HEMOCYTE  Take 1 tablet (106 mg of iron total) by mouth 2 (two) times daily.     methocarbamol 500 MG tablet  Commonly known as:  ROBAXIN  Take 1 tablet (500 mg total)  by mouth every 6 (six) hours as needed for muscle spasms.     nebivolol 10 MG tablet  Commonly known as:  BYSTOLIC  Take 20 mg by mouth daily. Patient takes in the evening     olmesartan-hydrochlorothiazide 40-12.5 MG per tablet  Commonly known as:  BENICAR HCT  Take 1 tablet by mouth every evening.     oxyCODONE 5 MG immediate release tablet  Commonly known as:  Oxy IR/ROXICODONE   Take 1-2 tablets (5-10 mg total) by mouth every 3 (three) hours as needed for breakthrough pain.     rivaroxaban 10 MG Tabs tablet  Commonly known as:  XARELTO  Take 1 tablet (10 mg total) by mouth daily with breakfast.     traMADol 50 MG tablet  Commonly known as:  ULTRAM  Take 1-2 tablets (50-100 mg total) by mouth every 6 (six) hours as needed for moderate pain.        Diagnostic Studies: No results found.  Disposition: 06-Home-Health Care Svc      Discharge Instructions    Call MD / Call 911    Complete by:  As directed   If you experience chest pain or shortness of breath, CALL 911 and be transported to the hospital emergency room.  If you develope a fever above 101 F, pus (white drainage) or increased drainage or redness at the wound, or calf pain, call your surgeon's office.     Constipation Prevention    Complete by:  As directed   Drink plenty of fluids.  Prune juice may be helpful.  You may use a stool softener, such as Colace (over the counter) 100 mg twice a day.  Use MiraLax (over the counter) for constipation as needed.     Diet - low sodium heart healthy    Complete by:  As directed      Increase activity slowly as tolerated    Complete by:  As directed            Follow-up Information    Follow up with Gearlean Alf, MD. Schedule an appointment as soon as possible for a visit on 06/05/2015.   Specialty:  Orthopedic Surgery   Why:  Call [phone] Monday to make the appointment   Contact information:   [address] [place] [address] [phone]        Signed: Ventura Bruns 05/26/2015, 8:54 AM

## 2015-05-26 NOTE — Clinical Social Work Placement (Signed)
   CLINICAL SOCIAL WORK PLACEMENT  NOTE  Date:  05/26/2015  Patient Details  Name: Beth [name] MRN: [id number] Date of Birth: 15-May-1946  Clinical Social Work is seeking post-discharge placement for this patient at the [place] level of care (*CSW will initial, date and re-position this form in  chart as items are completed):  Yes   Patient/family provided with [facility] Work Department's list of facilities offering this level of care within the geographic area requested by the patient (or if unable, by the patient's family).  Yes   Patient/family informed of their freedom to choose among providers that offer the needed level of care, that participate in Medicare, Medicaid or managed care program needed by the patient, have an available bed and are willing to accept the patient.  Yes   Patient/family informed of [facility]'s ownership interest in [facility] and [facility], as well as of the fact that they are under no obligation to receive care at these facilities.  PASRR submitted to EDS on       PASRR number received on       Existing PASRR number confirmed on 05/24/15     FL2 transmitted to all facilities in geographic area requested by pt/family on 05/24/15     FL2 transmitted to all facilities within larger geographic area on       Patient informed that his/her managed care company has contracts with or will negotiate with certain facilities, including the following:        Yes   Patient/family informed of bed offers received.  Patient chooses bed at [facility]     Physician recommends and patient chooses bed at      Patient to be transferred to   [facility] on      June 18, 16.  Patient to be transferred to facility by   daughter    Patient family notified on  June 18, 16 of transfer.  Name of family member notified:     Patricia/daughter   PHYSICIAN       Additional Comment:     _______________________________________________ Carlean Jews, LCSW 05/26/2015, 2:59 PM

## 2015-05-26 NOTE — Progress Notes (Signed)
   Subjective: 3 Days Post-Op Procedure(s) (LRB): RIGHT KNEE ARTHROPLASTY REIMPLANTATION (Right)  Pt doing well Mild pain in right knee but overall feeling better Ready for d/c to rehab facility Patient reports pain as mild.  Objective:   VITALS:   Filed Vitals:   05/26/15 0431  BP: 148/79  Pulse: 74  Temp: 98.5 F (36.9 C)  Resp: 16    Right knee incision healing well nv intact distally No rashes or edema  Good rom  LABS  Recent Labs  05/24/15 0418 05/25/15 0420 05/26/15 0530  HGB 8.5* 7.1* 9.2*  HCT 26.8* 22.7* 29.1*  WBC 9.4 11.7* 10.2  PLT 347 298 278     Recent Labs  05/24/15 0418 05/25/15 0420  NA 135 132*  K 4.5 4.3  BUN 23* 30*  CREATININE 1.13* 1.15*  GLUCOSE 244* 194*     Assessment/Plan: 3 Days Post-Op Procedure(s) (LRB): RIGHT KNEE ARTHROPLASTY REIMPLANTATION (Right) Plan for d/c to rehab facility today F/u in 2 weeks     Merla Riches, MPAS, PA-C  05/26/2015, 8:52 AM

## 2015-05-26 NOTE — Progress Notes (Signed)
Report called to [facility], spoke with Aecia.

## 2015-05-26 NOTE — Progress Notes (Signed)
Physical Therapy Treatment Patient Details Name: Beth [name] MRN: [id number] DOB: 03-Oct-1946 Today's Date: 05/26/2015    History of Present Illness Reimplantation R TKR; s/p spacer placement 3/16    PT Comments    Pt very motivated and eager to move to SNF for continued rehab.  Follow Up Recommendations  SNF     Equipment Recommendations  None recommended by PT    Recommendations for Other Services OT consult     Precautions / Restrictions Precautions Precautions: Knee;Fall Required Braces or Orthoses: Knee Immobilizer - Right Knee Immobilizer - Right: Discontinue once straight leg raise with < 10 degree lag Restrictions Weight Bearing Restrictions: Yes RLE Weight Bearing: Partial weight bearing Other Position/Activity Restrictions: NO ROM at knee past 60 degrees    Mobility  Bed Mobility Overal bed mobility: Needs Assistance Bed Mobility: Sit to Supine       Sit to supine: Min assist   General bed mobility comments: cues for sequence and use of L LE to self assist  Transfers Overall transfer level: Needs assistance Equipment used: Rolling walker (2 wheeled) Transfers: Sit to/from Stand Sit to Stand: Min assist;Mod assist         General transfer comment: cues for LE management and use of UEs to self assist  Ambulation/Gait Ambulation/Gait assistance: Min assist Ambulation Distance (Feet): 46 Feet Assistive device: Rolling walker (2 wheeled) Gait Pattern/deviations: Step-to pattern;Decreased step length - right;Decreased step length - left;Shuffle;Trunk flexed Gait velocity: decr   General Gait Details: cues for sequence, posture, position from RW and PWB   Stairs            Wheelchair Mobility    Modified Rankin (Stroke Patients Only)       Balance                                    Cognition Arousal/Alertness: Awake/alert Behavior During Therapy: WFL for tasks assessed/performed Overall Cognitive Status: Within  Functional Limits for tasks assessed                      Exercises Total Joint Exercises Ankle Circles/Pumps: AROM;Both;15 reps;Supine Quad Sets: AROM;Both;Supine;15 reps Heel Slides: AAROM;Right;Supine;15 reps Straight Leg Raises: AAROM;Right;Supine;15 reps Goniometric ROM: AAROM at R knee -12 - 40    General Comments        Pertinent Vitals/Pain Pain Assessment: 0-10 Pain Score: 5  Pain Location: R knee Pain Descriptors / Indicators: Aching;Sore Pain Intervention(s): Limited activity within patient's tolerance;Monitored during session;Premedicated before session;Ice applied    Home Living                      Prior Function            PT Goals (current goals can now be found in the care plan section) Acute Rehab PT Goals Patient Stated Goal: Walk without pain PT Goal Formulation: With patient Time For Goal Achievement: 05/31/15 Potential to Achieve Goals: Good Progress towards PT goals: Progressing toward goals    Frequency  7X/week    PT Plan Current plan remains appropriate    Co-evaluation             End of Session Equipment Utilized During Treatment: Gait belt;Right knee immobilizer Activity Tolerance: Patient tolerated treatment well Patient left: in bed;with call bell/phone within reach     Time: 1137-1208 PT Time Calculation (min) (ACUTE ONLY): 31 min  Charges:  $Gait Training: 8-22 mins $Therapeutic Exercise: 8-22 mins                    G Codes:      Gareld Obrecht 06-19-2015, 1:58 PM

## 2015-05-27 LAB — TYPE AND SCREEN
ABO/RH(D): O POS
Antibody Screen: NEGATIVE
Unit division: 0
Unit division: 0
Unit division: 0

## 2015-05-28 ENCOUNTER — Non-Acute Institutional Stay (SKILLED_NURSING_FACILITY): Payer: Medicare Other | Admitting: Adult Health

## 2015-05-28 ENCOUNTER — Encounter: Payer: Self-pay | Admitting: Adult Health

## 2015-05-28 DIAGNOSIS — K5900 Constipation, unspecified: Secondary | ICD-10-CM | POA: Diagnosis not present

## 2015-05-28 DIAGNOSIS — I10 Essential (primary) hypertension: Secondary | ICD-10-CM

## 2015-05-28 DIAGNOSIS — E785 Hyperlipidemia, unspecified: Secondary | ICD-10-CM | POA: Diagnosis not present

## 2015-05-28 DIAGNOSIS — T84099S Other mechanical complication of unspecified internal joint prosthesis, sequela: Secondary | ICD-10-CM

## 2015-05-28 DIAGNOSIS — D62 Acute posthemorrhagic anemia: Secondary | ICD-10-CM

## 2015-05-28 DIAGNOSIS — Z9660 Presence of unspecified orthopedic joint implant: Secondary | ICD-10-CM

## 2015-05-28 DIAGNOSIS — T84019S Broken internal joint prosthesis, unspecified site, sequela: Secondary | ICD-10-CM

## 2015-05-28 LAB — ANAEROBIC CULTURE

## 2015-05-29 ENCOUNTER — Non-Acute Institutional Stay (SKILLED_NURSING_FACILITY): Payer: Medicare Other | Admitting: Internal Medicine

## 2015-05-29 DIAGNOSIS — M1711 Unilateral primary osteoarthritis, right knee: Secondary | ICD-10-CM | POA: Diagnosis not present

## 2015-05-29 DIAGNOSIS — K5900 Constipation, unspecified: Secondary | ICD-10-CM | POA: Diagnosis not present

## 2015-05-29 DIAGNOSIS — I10 Essential (primary) hypertension: Secondary | ICD-10-CM | POA: Diagnosis not present

## 2015-05-29 DIAGNOSIS — E785 Hyperlipidemia, unspecified: Secondary | ICD-10-CM

## 2015-05-29 DIAGNOSIS — D509 Iron deficiency anemia, unspecified: Secondary | ICD-10-CM | POA: Diagnosis not present

## 2015-05-29 DIAGNOSIS — N179 Acute kidney failure, unspecified: Secondary | ICD-10-CM

## 2015-05-29 NOTE — Progress Notes (Signed)
Patient ID: Beth [name], female   DOB: 02/28/1946, 69 y.o.   MRN: [id number]     [facility]   PCP: Claretta Fraise, MD  Code Status: full code  Allergies  Allergen Reactions  . Aleve [Naproxen Sodium]     Heart races  . Clonidine Derivatives Other (See Comments)    Dizziness and weakness  . Shellfish Allergy Nausea And Vomiting    Chief Complaint  Patient presents with  . New Admit To SNF     HPI:  69 year old patient is here for short term rehabilitation post hospital admission from 05/23/15-05/26/15 with right knee OA. She underwent right total knee arthroplasty reimplantation. She denies any concerns today. Her pain is under control. As per staff, her dressing is soaked with blood. She has PMH of HTN, constipation, OA, iron deficiency anemia and HLD among others.  Review of Systems:  Constitutional: Negative for fever, chills, diaphoresis.  HENT: Negative for headache, congestion, nasal discharge Eyes: Negative for eye pain, blurred vision, double vision and discharge.  Respiratory: Negative for cough, shortness of breath and wheezing.   Cardiovascular: Negative for chest pain, palpitations, leg swelling.  Gastrointestinal: Negative for heartburn, nausea, vomiting, abdominal pain. Bowel movement last night Genitourinary: Negative for dysuria Musculoskeletal: Negative for back pain, falls Skin: Negative for itching, rash.  Neurological: Negative for dizziness, tingling, focal weakness Psychiatric/Behavioral: Negative for depression   Past Medical History  Diagnosis Date  . Hypertension   . CVA (cerebral infarction)     2006  . Hyperlipidemia   . Blood transfusion without reported diagnosis 2012    anemia;pt denies transfusion stated was only on iron tablet  . Heart murmur   . Nocturia     3-4 times per night  . Gout     left elbow  . Arthritis     Knee both knees  . Anemia   . 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  08-09-14 Right eye  . Family history of anesthesia complication     sister very slow to awaken after anesthesia;severe vomiting   . Stroke 2006    x 1 no deficits noted    Past Surgical History  Procedure Laterality Date  . Carpal tunnel release Right 1983  . Abdominal hysterectomy  1983  . Colonscopy  June 21, 2014  . Nasal cauterization  2012  . Total knee arthroplasty Right 07/03/2014    Procedure: RIGHT TOTAL KNEE ARTHROPLASTY;  Surgeon: Gearlean Alf, MD;  Location: [facility];  Service: Orthopedics;  Laterality: Right;  . Patellar tendon repair Right 08/11/2014    Procedure: RIGHT PATELLA TENDON REPAIR;  Surgeon: Gearlean Alf, MD;  Location: [facility];  Service: Orthopedics;  Laterality: Right;  . Joint replacement  06/2014    right knee  . I&d knee with poly exchange Right 10/02/2014    Procedure: IRRIGATION AND DEBRIDEMENT RIGHT KNEE WITH POLY EXCHANGE;  Surgeon: Gearlean Alf, MD;  Location: [facility];  Service: Orthopedics;  Laterality: Right;  . Excisional total knee arthroplasty with antibiotic spacers Right 02/16/2015    Procedure: RIGHT KNEE RESECTION ARTHROPLASTY WITH ANTIBIOTIC SPACERS;  Surgeon: [name], MD;  Location: [facility];  Service: Orthopedics;  Laterality: Right;  . Reimplantation of total knee Right 05/23/2015    Procedure: RIGHT KNEE ARTHROPLASTY REIMPLANTATION;  Surgeon: [name], MD;  Location: [facility];  Service: Orthopedics;  Laterality: Right;   Social History:   reports that she has never smoked.  She has never used smokeless tobacco. She reports that she does not drink alcohol or use illicit drugs.  Family History  Problem Relation Age of Onset  . Brain cancer Mother   . Ovarian cancer Mother   . Diabetes Son   . Diabetes Son   . Colon cancer Neg Hx   . Esophageal cancer Neg Hx   . Stomach cancer Neg Hx   . Rectal cancer Neg Hx     Medications: Patient's Medications  New Prescriptions   No medications on file  Previous Medications    AMLODIPINE (NORVASC) 10 MG TABLET    Take 1 tablet (10 mg total) by mouth every evening.   ATORVASTATIN (LIPITOR) 20 MG TABLET    Take 1 tablet (20 mg total) by mouth every evening.   FERROUS FUMARATE (HEMOCYTE) 325 (106 FE) MG TABS TABLET    Take 1 tablet (106 mg of iron total) by mouth 2 (two) times daily.   METHOCARBAMOL (ROBAXIN) 500 MG TABLET    Take 1 tablet (500 mg total) by mouth every 6 (six) hours as needed for muscle spasms.   NEBIVOLOL (BYSTOLIC) 10 MG TABLET    Take 20 mg by mouth daily. Patient takes in the evening   OLMESARTAN-HYDROCHLOROTHIAZIDE (BENICAR HCT) 40-12.5 MG PER TABLET    Take 1 tablet by mouth every evening.   OXYCODONE (OXY IR/ROXICODONE) 5 MG IMMEDIATE RELEASE TABLET    Take 1-2 tablets (5-10 mg total) by mouth every 3 (three) hours as needed for breakthrough pain.   RIVAROXABAN (XARELTO) 10 MG TABS TABLET    Take 1 tablet (10 mg total) by mouth daily with breakfast.   TRAMADOL (ULTRAM) 50 MG TABLET    Take 1-2 tablets (50-100 mg total) by mouth every 6 (six) hours as needed for moderate pain.  Modified Medications   No medications on file  Discontinued Medications   No medications on file     Physical Exam: Filed Vitals:   05/29/15 1719  BP: 150/87  Pulse: 77  Temp: 98.9 F (37.2 C)  Resp: 14  Weight: 208 lb 9.6 oz (94.62 kg)  SpO2: 96%    General- elderly female, in no acute distress Head- normocephalic, atraumatic Throat- moist mucus membrane Eyes- no pallor, no icterus, no discharge, normal conjunctiva, normal sclera Neck- no cervical lymphadenopathy, no jugular vein distension Cardiovascular- normal s1,s2, no murmurs, palpable dorsalis pedis, right trace leg edema Respiratory- bilateral clear to auscultation, no wheeze, no rhonchi, no crackles, no use of accessory muscles Abdomen- bowel sounds present, soft, non tender Musculoskeletal- able to move all 4 extremities, right knee ROM limited Neurological- no focal deficit Skin- warm and dry,  right knee incision has small dehisence at one point and has minimal bleed at 3 points, no signs of infection, staples in place Psychiatry- alert and oriented to person, place and time, normal mood and affect    Labs reviewed: Basic Metabolic Panel:  Recent Labs  04/30/15 1420 05/24/15 0418 05/25/15 0420  NA 136 135 132*  K 3.9 4.5 4.3  CL 99* 101 99*  CO2 29 25 27   GLUCOSE 152* 244* 194*  BUN 18 23* 30*  CREATININE 1.00 1.13* 1.15*  CALCIUM 9.3 8.4* 8.3*   Liver Function Tests:  Recent Labs  09/28/14 1500 02/09/15 1320 04/30/15 1420  AST 12 18 27   ALT 10 11 31   ALKPHOS 106 119* 126  BILITOT 0.7 0.8 0.6  PROT 7.2 8.1 8.3*  ALBUMIN 3.1* 3.6 3.4*   No results  for input(s): LIPASE, AMYLASE in the last 8760 hours. No results for input(s): AMMONIA in the last 8760 hours. CBC:  Recent Labs  03/19/15 1420 04/18/15 1426  05/24/15 0418 05/25/15 0420 05/26/15 0530  WBC 11.2* 8.9  < > 9.4 11.7* 10.2  NEUTROABS 8.8* 6.4  --   --   --   --   HGB 9.1*  --   < > 8.5* 7.1* 9.2*  HCT 30.6* 33.5*  < > 26.8* 22.7* 29.1*  MCV 79  --   < > 75.7* 76.2* 77.8*  PLT 458*  --   < > 347 298 278  < > = values in this interval not displayed.  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 Will have her work with physical therapy and occupational therapy team to help with gait training and muscle strengthening exercises.fall precautions. Skin care. Encourage to be out of bed. Continue xarelto for dvt prophylaxis. Continue tramadol 50 mg 1-2 tab q6h prn pain and robaxin 500 mg q6h prn muscle spasm. Has follow up with orthopedics.   Acute blood loss anemia Post op, hb 8.5 and low mcv. Continue hemocyte 325 mg bid, monitor h&h  Hypertension Elevated bp. continue Norvasc 10 mg daily, bystolic 20 mg daily and  Benicar HCT 40-12.5 mg daily. Monitor bp reading  Acute renal failure Creatinine returned to baseline, monitor  Constipation Stable, continue senna s 2 tab bid, miralax 17 g bid  Hyperlipidemia continue Lipitor 20 mg daily   Goals of care: short term rehabilitation   Labs/tests ordered: cbc 1 week  Family/ staff Communication: reviewed care plan with patient and nursing supervisor    Blanchie Serve, MD  [facility] [phone] (Monday-Friday 8 am - 5 pm) [phone] (afterhours)

## 2015-05-29 NOTE — Progress Notes (Signed)
Patient ID: Beth [name], female   DOB: 09-24-1946, 69 y.o.   MRN: [id number]   05/28/15  Facility:  Nursing Home Location:  [place] Room Number: 1204-P LEVEL OF CARE:  SNF (31)   Chief Complaint  Patient presents with  . Hospitalization Follow-up    Failed right total knee arthroplasty S/P right knee arthroplasty reimplantation, hypertension, hyperlipidemia, anemia and constipation    HISTORY OF PRESENT ILLNESS:  This is a 69 year old female who has been admitted to [facility] on 05/26/15 from [facility] with failed right total knee arthroplasty S/P right knee arthroplasty reimplantation. She has PMH of hypertension, CVA 2006 (was weak on the right side but no residual now) and hyperlipidemia.  She has been admitted for a short-term rehabilitation.  PAST MEDICAL HISTORY:  Past Medical History  Diagnosis Date  . Hypertension   . CVA (cerebral infarction)     2006  . Hyperlipidemia   . Blood transfusion without reported diagnosis 2012    anemia;pt denies transfusion stated was only on iron tablet  . Heart murmur   . Nocturia     3-4 times per night  . Gout     left elbow  . Arthritis     Knee both knees  . Anemia   . 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 08-09-14 Right eye  . Family history of anesthesia complication     sister very slow to awaken after anesthesia;severe vomiting   . Stroke 2006    x 1 no deficits noted     CURRENT MEDICATIONS: Reviewed per MAR/see medication list  Allergies  Allergen Reactions  . Aleve [Naproxen Sodium]     Heart races  . Clonidine Derivatives Other (See Comments)    Dizziness and weakness  . Shellfish Allergy Nausea And Vomiting     REVIEW OF SYSTEMS:  GENERAL: no change in appetite, no fatigue, no weight changes, no fever, chills or weakness RESPIRATORY: no cough, SOB, DOE, wheezing, hemoptysis CARDIAC: no chest pain,  or palpitations GI: no abdominal pain, diarrhea,  heart burn, nausea or vomiting, +constipation  PHYSICAL EXAMINATION  GENERAL: no acute distress, normal body habitus SKIN:  Right knee surgical incision has Steri-Strips; with moderate serosanguineous drainage; no erythema EYES: conjunctivae normal, sclerae normal, normal eye lids NECK: supple, trachea midline, no neck masses, no thyroid tenderness, no thyromegaly LYMPHATICS: no LAN in the neck, no supraclavicular LAN RESPIRATORY: breathing is even & unlabored, BS CTAB CARDIAC: RRR, no murmur,no extra heart sounds, RLE edema 2+ GI: abdomen soft, normal BS, no masses, no tenderness, no hepatomegaly, no splenomegaly EXTREMITIES: Able to move 4 extremities PSYCHIATRIC: the patient is alert & oriented to person, affect & behavior appropriate  LABS/RADIOLOGY: Labs reviewed: Basic Metabolic Panel:  Recent Labs  04/30/15 1420 05/24/15 0418 05/25/15 0420  NA 136 135 132*  K 3.9 4.5 4.3  CL 99* 101 99*  CO2 29 25 27   GLUCOSE 152* 244* 194*  BUN 18 23* 30*  CREATININE 1.00 1.13* 1.15*  CALCIUM 9.3 8.4* 8.3*   Liver Function Tests:  Recent Labs  09/28/14 1500 02/09/15 1320 04/30/15 1420  AST 12 18 27   ALT 10 11 31   ALKPHOS 106 119* 126  BILITOT 0.7 0.8 0.6  PROT 7.2 8.1 8.3*  ALBUMIN 3.1* 3.6 3.4*   CBC:  Recent Labs  03/19/15 1420 04/18/15 1426  05/24/15 0418 05/25/15 0420 05/26/15 0530  WBC 11.2* 8.9  < > 9.4 11.7*  10.2  NEUTROABS 8.8* 6.4  --   --   --   --   HGB 9.1*  --   < > 8.5* 7.1* 9.2*  HCT 30.6* 33.5*  < > 26.8* 22.7* 29.1*  MCV 79  --   < > 75.7* 76.2* 77.8*  PLT 458*  --   < > 347 298 278  < > = values in this interval not displayed.  CBG:  Recent Labs  08/12/14 1743 10/02/14 1036  GLUCAP 181* 171*     ASSESSMENT/PLAN:  Failed right total knee arthroplasty S/P right knee arthroplasty re- implementation - for rehabilitation; continue Robaxin 500 mg 1 tab by mouth every 6 hours when necessary for muscle spasm; tramadol 50 mg 1-2 tabs by  mouth every 6 hours when necessary for pain; Xarelto 10 mg 1 tab by mouth daily for DVT prophylaxis; follow-up with Dr. Wynelle Link, orthopedic surgeon, on 06/05/15 Hypertension - continue Norvasc 10 mg 1 tab by mouth daily evening, Benicar HCT 40-12 0.5 mg 1 tab by mouth every evening and Bystolic 20 mg by mouth daily Hyperlipidemia - continue Lipitor 20 mg 1 tab by mouth every evening Anemia, acute blood loss - hemoglobin 9.2; continue Hemocyte 106 mg by mouth twice a day Constipation - start senna S2 tabs by mouth twice a day and MiraLAX 17 g by mouth twice a day   Goals of care:  Short-term rehabilitation   Labs/test ordered:  CBC, BMP   Spent 50 minutes in patient care.    [facility], NP [employer] [phone]

## 2015-06-02 ENCOUNTER — Emergency Department (HOSPITAL_COMMUNITY)
Admission: EM | Admit: 2015-06-02 | Discharge: 2015-06-02 | Disposition: A | Discharge status: Home / Self Care | Payer: Medicare Other | Attending: Emergency Medicine | Admitting: Emergency Medicine

## 2015-06-02 ENCOUNTER — Encounter (HOSPITAL_COMMUNITY): Payer: Self-pay | Admitting: Emergency Medicine

## 2015-06-02 DIAGNOSIS — R011 Cardiac murmur, unspecified: Secondary | ICD-10-CM | POA: Diagnosis not present

## 2015-06-02 DIAGNOSIS — Z8673 Personal history of transient ischemic attack (TIA), and cerebral infarction without residual deficits: Secondary | ICD-10-CM | POA: Diagnosis not present

## 2015-06-02 DIAGNOSIS — M1990 Unspecified osteoarthritis, unspecified site: Secondary | ICD-10-CM | POA: Insufficient documentation

## 2015-06-02 DIAGNOSIS — E785 Hyperlipidemia, unspecified: Secondary | ICD-10-CM | POA: Insufficient documentation

## 2015-06-02 DIAGNOSIS — Z79899 Other long term (current) drug therapy: Secondary | ICD-10-CM | POA: Diagnosis not present

## 2015-06-02 DIAGNOSIS — Z8619 Personal history of other infectious and parasitic diseases: Secondary | ICD-10-CM | POA: Insufficient documentation

## 2015-06-02 DIAGNOSIS — L7622 Postprocedural hemorrhage and hematoma of skin and subcutaneous tissue following other procedure: Secondary | ICD-10-CM | POA: Diagnosis not present

## 2015-06-02 DIAGNOSIS — I10 Essential (primary) hypertension: Secondary | ICD-10-CM | POA: Diagnosis not present

## 2015-06-02 DIAGNOSIS — D649 Anemia, unspecified: Secondary | ICD-10-CM

## 2015-06-02 DIAGNOSIS — M25561 Pain in right knee: Secondary | ICD-10-CM | POA: Diagnosis not present

## 2015-06-02 DIAGNOSIS — R58 Hemorrhage, not elsewhere classified: Secondary | ICD-10-CM

## 2015-06-02 LAB — I-STAT CHEM 8, ED
BUN: 16 mg/dL (ref 6–20)
CHLORIDE: 99 mmol/L — AB (ref 101–111)
Calcium, Ion: 1.11 mmol/L — ABNORMAL LOW (ref 1.13–1.30)
Creatinine, Ser: 1.1 mg/dL — ABNORMAL HIGH (ref 0.44–1.00)
Glucose, Bld: 134 mg/dL — ABNORMAL HIGH (ref 65–99)
HCT: 35 % — ABNORMAL LOW (ref 36.0–46.0)
HEMOGLOBIN: 11.9 g/dL — AB (ref 12.0–15.0)
Potassium: 3.7 mmol/L (ref 3.5–5.1)
SODIUM: 134 mmol/L — AB (ref 135–145)
TCO2: 26 mmol/L (ref 0–100)

## 2015-06-02 LAB — CBC WITH DIFFERENTIAL/PLATELET
Basophils Absolute: 0 10*3/uL (ref 0.0–0.1)
Basophils Relative: 0 % (ref 0–1)
EOS PCT: 3 % (ref 0–5)
Eosinophils Absolute: 0.3 10*3/uL (ref 0.0–0.7)
HCT: 33.8 % — ABNORMAL LOW (ref 36.0–46.0)
HEMOGLOBIN: 10.3 g/dL — AB (ref 12.0–15.0)
LYMPHS ABS: 2 10*3/uL (ref 0.7–4.0)
Lymphocytes Relative: 20 % (ref 12–46)
MCH: 24.2 pg — ABNORMAL LOW (ref 26.0–34.0)
MCHC: 30.5 g/dL (ref 30.0–36.0)
MCV: 79.3 fL (ref 78.0–100.0)
MONOS PCT: 6 % (ref 3–12)
Monocytes Absolute: 0.6 10*3/uL (ref 0.1–1.0)
NEUTROS ABS: 7.2 10*3/uL (ref 1.7–7.7)
Neutrophils Relative %: 71 % (ref 43–77)
PLATELETS: 471 10*3/uL — AB (ref 150–400)
RBC: 4.26 MIL/uL (ref 3.87–5.11)
RDW: 18.5 % — AB (ref 11.5–15.5)
WBC: 10.2 10*3/uL (ref 4.0–10.5)

## 2015-06-02 LAB — PROTIME-INR
INR: 1.53 — AB (ref 0.00–1.49)
Prothrombin Time: 18.4 seconds — ABNORMAL HIGH (ref 11.6–15.2)

## 2015-06-02 NOTE — ED Notes (Signed)
Pt reports she had a R knee replacement 10 days ago. Pt reports intermittent bleeding form incision site worse over the past 4 days. Pt reports they have changed the dressing 3x as the rehab facility in the past 24 hours. Pt has knee immobilizer on. Upper region of staples has no redness or erythema.

## 2015-06-02 NOTE — Discharge Instructions (Addendum)
Wound VAC: 75 mmhg  Intermittent suction Ok to ambulate  Keep f/u appointment Tuesday with Dr Maureen Ralphs  Your dressing has been changed to a wound vac. The nursing home will need to place it at 7mmhg of intermittent suction. You may ambulate. Follow up with Dr. Wynelle Link at your appointment on Tuesday. Return to the ER for changes or worsening symptoms.   Knee Pain Knee pain can be a result of an injury or other medical conditions. Treatment will depend on the cause of your pain. HOME CARE  Only take medicine as told by your doctor.  Keep a healthy weight. Being overweight can make the knee hurt more.  Stretch before exercising or playing sports.  If there is constant knee pain, change the way you exercise. Ask your doctor for advice.  Make sure shoes fit well. Choose the right shoe for the sport or activity.  Protect your knees. Wear kneepads if needed.  Rest when you are tired. GET HELP RIGHT AWAY IF:   Your knee pain does not stop.  Your knee pain does not get better.  Your knee joint feels hot to the touch.  You have a fever. MAKE SURE YOU:   Understand these instructions.  Will watch this condition.  Will get help right away if you are not doing well or get worse. Document Released: 02/20/2009 Document Revised: 02/16/2012 Document Reviewed: 02/20/2009 [facility] Patient Information 2015 [place], [place]. This information is not intended to replace advice given to you by your health care provider. Make sure you discuss any questions you have with your health care provider.

## 2015-06-02 NOTE — Consult Note (Signed)
STACKS,WARREN, MD Chief Complaint: Bleeding from surgical wound History: Pt reports she had a R knee replacement 10 days ago. Pt reports intermittent bleeding form incision site worse over the past 4 days. Pt reports they have changed the dressing 3x as the rehab facility in the past 24 hours. Pt has knee immobilizer on. Upper region of staples has no redness or erythema. Past Medical History  Diagnosis Date  . Hypertension   . CVA (cerebral infarction)     2006  . Hyperlipidemia   . Blood transfusion without reported diagnosis 2012    anemia;pt denies transfusion stated was only on iron tablet  . Heart murmur   . Nocturia     3-4 times per night  . Gout     left elbow  . Arthritis     Knee both knees  . Anemia   . 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 08-09-14 Right eye  . Family history of anesthesia complication     sister very slow to awaken after anesthesia;severe vomiting   . Stroke 2006    x 1 no deficits noted     Allergies  Allergen Reactions  . Aleve [Naproxen Sodium]     Heart races  . Clonidine Derivatives Other (See Comments)    Dizziness and weakness  . Shellfish Allergy Nausea And Vomiting    Current Facility-Administered Medications on File Prior to Encounter  Medication Dose Route Frequency Provider Last Rate Last Dose  . tranexamic acid (CYKLOKAPRON) 2,000 mg in sodium chloride 0.9 % 50 mL Topical Application  7,829 mg Topical Once [name], MD       Current Outpatient Prescriptions on File Prior to Encounter  Medication Sig Dispense Refill  . amLODipine (NORVASC) 10 MG tablet Take 1 tablet (10 mg total) by mouth every evening. 90 tablet 4  . atorvastatin (LIPITOR) 20 MG tablet Take 1 tablet (20 mg total) by mouth every evening. 90 tablet 4  . ferrous fumarate (HEMOCYTE) 325 (106 FE) MG TABS tablet Take 1 tablet (106 mg of iron total) by mouth 2 (two) times daily. 60 each 2  . methocarbamol (ROBAXIN) 500 MG tablet Take 1 tablet (500 mg  total) by mouth every 6 (six) hours as needed for muscle spasms. 40 tablet 1  . olmesartan-hydrochlorothiazide (BENICAR HCT) 40-12.5 MG per tablet Take 1 tablet by mouth every evening. 90 tablet 4  . oxyCODONE (OXY IR/ROXICODONE) 5 MG immediate release tablet Take 1-2 tablets (5-10 mg total) by mouth every 3 (three) hours as needed for breakthrough pain. 60 tablet 0  . rivaroxaban (XARELTO) 10 MG TABS tablet Take 1 tablet (10 mg total) by mouth daily with breakfast. 20 tablet 0  . traMADol (ULTRAM) 50 MG tablet Take 1-2 tablets (50-100 mg total) by mouth every 6 (six) hours as needed for moderate pain. 60 tablet 0    Physical Exam: Filed Vitals:   06/02/15 1943  BP: 150/77  Pulse: 98  Temp:   Resp: 20  A+O X3 No sob/cp abd soft.nt Compartments soft/nt Wound intact - positive bleeding no purulent drainage EHL/TA/GA intact Image: No results found.  A/P: Patient s/p right TKR X 4.  Post operative course complicated by infection.  Patient at [facility] and noted to have bleeding on wound.  Sent to ER for evaluation.   No signs of infection.  Compartments soft/nt Case discussed with Dr Maureen Ralphs - shared images of wound. Plan: vac dressing applied to wound to keep clean and dry Will  d/c back to [place] F/U as arranged Tuesday with Dr Maureen Ralphs.

## 2015-06-02 NOTE — ED Provider Notes (Signed)
CSN: [id number]     Arrival date & time 06/02/15  1743 History   First MD Initiated Contact with Patient 06/02/15 1819     Chief Complaint  Patient presents with  . Knee Pain     (Consider location/radiation/quality/duration/timing/severity/associated sxs/prior Treatment) HPI Comments: Beth [name] is a 69 y.o. female with a PMHx of HTN, CVA, HLD, heart murmur, gout, knee arthritis s/p R TKR on 05/23/15 by Dr. Wynelle Link, and anemia, who presents to the ED with complaints of bleeding from her right knee incision site (TKR 10 days ago) that has progressively worsened over the last 4 days. She reports that she has had to change the dressing on her knee incision 3 times daily because it is saturated with bright red blood. She endorses 7/10 intermittent nonradiating right knee pain which is aching, worse when she does physical therapy, and improved with her oxycodone and Robaxin prescriptions. She states this pain is similar to the pain she had when she was discharged from the hospital after her total knee replacement 10 days ago. She endorses some mild warmth, but denies any skin erythema, joint swelling or lower leg swelling, or drainage from the incision site. Denies any fevers or chills, chest pain, shortness breath, abdominal pain, nausea, vomiting, diarrhea, constipation, melena, hematochezia, dysuria, hematuria, numbness, tingling, weakness, lightheadedness, or dizziness. She states that Dr. Rolena Infante knows that she is coming into the ER and was going to evaluate her here.  Patient is a 69 y.o. female presenting with knee pain. The history is provided by the patient. No language interpreter was used.  Knee Pain Location:  Knee Time since incident:  10 days Injury: no   Knee location:  R knee Pain details:    Quality:  Aching   Radiates to:  Does not radiate   Severity:  Moderate   Onset quality:  Gradual   Duration:  10 days   Timing:  Intermittent   Progression:  Improving Chronicity:   New Prior injury to area:  No Relieved by:  Muscle relaxant (oxycodone and robaxin) Worsened by:  Activity Ineffective treatments:  None tried Associated symptoms: decreased ROM (improving)   Associated symptoms: no fever, no muscle weakness, no numbness, no swelling (improving from surgery) and no tingling     Past Medical History  Diagnosis Date  . Hypertension   . CVA (cerebral infarction)     2006  . Hyperlipidemia   . Blood transfusion without reported diagnosis 2012    anemia;pt denies transfusion stated was only on iron tablet  . Heart murmur   . Nocturia     3-4 times per night  . Gout     left elbow  . Arthritis     Knee both knees  . Anemia   . 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 08-09-14 Right eye  . Family history of anesthesia complication     sister very slow to awaken after anesthesia;severe vomiting   . Stroke 2006    x 1 no deficits noted    Past Surgical History  Procedure Laterality Date  . Carpal tunnel release Right 1983  . Abdominal hysterectomy  1983  . Colonscopy  June 21, 2014  . Nasal cauterization  2012  . Total knee arthroplasty Right 07/03/2014    Procedure: RIGHT TOTAL KNEE ARTHROPLASTY;  Surgeon: Gearlean Alf, MD;  Location: [facility];  Service: Orthopedics;  Laterality: Right;  . Patellar tendon repair Right 08/11/2014    Procedure: RIGHT PATELLA  TENDON REPAIR;  Surgeon: Gearlean Alf, MD;  Location: [facility];  Service: Orthopedics;  Laterality: Right;  . Joint replacement  06/2014    right knee  . I&d knee with poly exchange Right 10/02/2014    Procedure: IRRIGATION AND DEBRIDEMENT RIGHT KNEE WITH POLY EXCHANGE;  Surgeon: Gearlean Alf, MD;  Location: [facility];  Service: Orthopedics;  Laterality: Right;  . Excisional total knee arthroplasty with antibiotic spacers Right 02/16/2015    Procedure: RIGHT KNEE RESECTION ARTHROPLASTY WITH ANTIBIOTIC SPACERS;  Surgeon: [name], MD;  Location: [facility];  Service: Orthopedics;   Laterality: Right;  . Reimplantation of total knee Right 05/23/2015    Procedure: RIGHT KNEE ARTHROPLASTY REIMPLANTATION;  Surgeon: [name], MD;  Location: [facility];  Service: Orthopedics;  Laterality: Right;   Family History  Problem Relation Age of Onset  . Brain cancer Mother   . Ovarian cancer Mother   . Diabetes Son   . Diabetes Son   . Colon cancer Neg Hx   . Esophageal cancer Neg Hx   . Stomach cancer Neg Hx   . Rectal cancer Neg Hx    History  Substance Use Topics  . Smoking status: Never Smoker   . Smokeless tobacco: Never Used  . Alcohol Use: No   OB History    No data available     Review of Systems  Constitutional: Negative for fever and chills.  Respiratory: Negative for shortness of breath.   Cardiovascular: Negative for chest pain and leg swelling.  Gastrointestinal: Negative for nausea, vomiting, abdominal pain, diarrhea, constipation and blood in stool.  Genitourinary: Negative for dysuria, hematuria, vaginal bleeding and vaginal discharge.  Musculoskeletal: Positive for arthralgias (R knee). Negative for myalgias and joint swelling.  Skin: Positive for wound (surgical incision R knee). Negative for color change.  Allergic/Immunologic: Negative for immunocompromised state.  Neurological: Negative for dizziness, syncope, weakness, light-headedness and numbness.  Hematological: Bruises/bleeds easily (just finished xarelto).  Psychiatric/Behavioral: Negative for confusion.   10 Systems reviewed and are negative for acute change except as noted in the HPI.    Allergies  Aleve; Clonidine derivatives; and Shellfish allergy  Home Medications   Prior to Admission medications   Medication Sig Start Date End Date Taking? Authorizing Provider  amLODipine (NORVASC) 10 MG tablet Take 1 tablet (10 mg total) by mouth every evening. 03/19/15   Claretta Fraise, MD  atorvastatin (LIPITOR) 20 MG tablet Take 1 tablet (20 mg total) by mouth every evening. 03/19/15   Claretta Fraise, MD  ferrous fumarate (HEMOCYTE) 325 (106 FE) MG TABS tablet Take 1 tablet (106 mg of iron total) by mouth 2 (two) times daily. 03/19/15   Claretta Fraise, MD  methocarbamol (ROBAXIN) 500 MG tablet Take 1 tablet (500 mg total) by mouth every 6 (six) hours as needed for muscle spasms. 05/24/15   Amber Constable, PA-C  nebivolol (BYSTOLIC) 10 MG tablet Take 20 mg by mouth daily. Patient takes in the evening    Historical Provider, MD  olmesartan-hydrochlorothiazide (BENICAR HCT) 40-12.5 MG per tablet Take 1 tablet by mouth every evening. 03/19/15   Claretta Fraise, MD  oxyCODONE (OXY IR/ROXICODONE) 5 MG immediate release tablet Take 1-2 tablets (5-10 mg total) by mouth every 3 (three) hours as needed for breakthrough pain. 05/24/15   Amber Cecilio Asper, PA-C  rivaroxaban (XARELTO) 10 MG TABS tablet Take 1 tablet (10 mg total) by mouth daily with breakfast. 05/24/15   Ardeen Jourdain, PA-C  traMADol (ULTRAM) 50 MG tablet Take 1-2  tablets (50-100 mg total) by mouth every 6 (six) hours as needed for moderate pain. 05/24/15   Amber Constable, PA-C   BP 147/80 mmHg  Pulse 67  Temp(Src) 98.1 F (36.7 C) (Oral)  Resp 16  SpO2 100% Physical Exam  Constitutional: She is oriented to person, place, and time. Vital signs are normal. She appears well-developed and well-nourished.  Non-toxic appearance. No distress.  Afebrile, nontoxic, NAD  HENT:  Head: Normocephalic and atraumatic.  Mouth/Throat: Oropharynx is clear and moist and mucous membranes are normal.  Eyes: Conjunctivae and EOM are normal. Right eye exhibits no discharge. Left eye exhibits no discharge.  Neck: Normal range of motion. Neck supple.  Cardiovascular: Normal rate, regular rhythm, normal heart sounds and intact distal pulses.  Exam reveals no gallop and no friction rub.   No murmur heard. Pulmonary/Chest: Effort normal and breath sounds normal. No respiratory distress. She has no decreased breath sounds. She has no wheezes. She has no  rhonchi. She has no rales.  Abdominal: Soft. Normal appearance and bowel sounds are normal. She exhibits no distension. There is no tenderness. There is no rigidity, no rebound, no guarding, no CVA tenderness, no tenderness at McBurney's point and negative Murphy's sign.  Musculoskeletal:       Right knee: She exhibits decreased range of motion (due to pain), swelling (mild, postoperative) and laceration (surgical incision down midline, with some opening just under patella). She exhibits no effusion, no deformity, no erythema, normal alignment, no LCL laxity and no MCL laxity. Tenderness (mild diffuse) found.  R knee with mildly limited ROM due to pain, mild diffuse joint line TTP without focal bony TTP, mild postoperative swelling without pedal edema, surgical incision down midline with some opening just under patella (SEE PICTURE BELOW), no discharge but dressing soaked with blood, no effusion/deformity, no bruising or erythema, very trace warmth, no abnormal alignment, no varus/valgus laxity. Distal pulses intact, strength and sensation grossly intact.  Neurological: She is alert and oriented to person, place, and time. She has normal strength. No sensory deficit.  Skin: Skin is warm and dry. Laceration (surgical incision R knee) noted. No rash noted.  R knee incision as noted above, SEE PICTURE BELOW  Psychiatric: She has a normal mood and affect.  Nursing note and vitals reviewed.     ED Course  Procedures (including critical care time) Labs Review Labs Reviewed  CBC WITH DIFFERENTIAL/PLATELET - Abnormal; Notable for the following:    Hemoglobin 10.3 (*)    HCT 33.8 (*)    MCH 24.2 (*)    RDW 18.5 (*)    Platelets 471 (*)    All other components within normal limits  PROTIME-INR - Abnormal; Notable for the following:    Prothrombin Time 18.4 (*)    INR 1.53 (*)    All other components within normal limits  I-STAT CHEM 8, ED - Abnormal; Notable for the following:    Sodium 134 (*)     Chloride 99 (*)    Creatinine, Ser 1.10 (*)    Glucose, Bld 134 (*)    Calcium, Ion 1.11 (*)    Hemoglobin 11.9 (*)    HCT 35.0 (*)    All other components within normal limits    Imaging Review No results found.   EKG Interpretation None      MDM   Final diagnoses:  Bleeding  Knee pain, acute, right  Anemia, unspecified anemia type    69 y.o. female here with bleeding from surgical  incision site from R TKR done 10 days ago by Dr. Wynelle Link. Area just inferior to patella appears to be having some dehiscence of wound. No drainage, mild warmth, no erythema or LE swelling. Neurovascularly intact with soft compartments. Finished xarelto today. States that Dr. Rolena Infante knew she was on her way. Will page Dr. Rolena Infante and get basic labs now. Will reassess shortly.   7:13 PM Dr. Rolena Infante returning page, will come eval pt shortly. Asked that we check pt's INR as well.  8:39 PM Dr. Rolena Infante here to see pt, placed wound vac on wound and instructed nursing home on use of this. Labs stable and c/w prior results, anemia improving. INR 1.53. Will d/c to SNF with instructions given by Dr. Rolena Infante. Will have her f/up with [facility] in 3 days at her already scheduled appt. I explained the diagnosis and have given explicit precautions to return to the ER including for any other new or worsening symptoms. The patient understands and accepts the medical plan as it's been dictated and I have answered their questions. Discharge instructions concerning home care and prescriptions have been given. The patient is STABLE and is discharged to home in good condition.  BP 150/77 mmHg  Pulse 98  Temp(Src) 98.1 F (36.7 C) (Oral)  Resp 20  SpO2 99%   Bridgid Printz Camprubi-Soms, PA-C 06/02/15 2040  Lajean Saver, MD 06/02/15 2057

## 2015-06-04 ENCOUNTER — Other Ambulatory Visit: Payer: Self-pay | Admitting: *Deleted

## 2015-06-04 NOTE — Telephone Encounter (Signed)
Received fax from [facility] and cannot read all of the directions on Rx. Refaxed with question of direction. [place] Resident.

## 2015-06-05 ENCOUNTER — Other Ambulatory Visit: Payer: Self-pay | Admitting: *Deleted

## 2015-06-05 MED ORDER — OXYCODONE HCL 5 MG PO TABS: ORAL_TABLET | ORAL | Status: DC

## 2015-06-05 NOTE — Telephone Encounter (Signed)
[name] 

## 2015-06-14 ENCOUNTER — Encounter: Payer: Self-pay | Admitting: Adult Health

## 2015-06-14 ENCOUNTER — Non-Acute Institutional Stay (SKILLED_NURSING_FACILITY): Payer: Medicare Other | Admitting: Adult Health

## 2015-06-14 DIAGNOSIS — K5900 Constipation, unspecified: Secondary | ICD-10-CM | POA: Diagnosis not present

## 2015-06-14 DIAGNOSIS — D62 Acute posthemorrhagic anemia: Secondary | ICD-10-CM

## 2015-06-14 DIAGNOSIS — T814XXS Infection following a procedure, sequela: Secondary | ICD-10-CM

## 2015-06-14 DIAGNOSIS — I10 Essential (primary) hypertension: Secondary | ICD-10-CM

## 2015-06-14 DIAGNOSIS — E785 Hyperlipidemia, unspecified: Secondary | ICD-10-CM

## 2015-06-14 DIAGNOSIS — IMO0001 Reserved for inherently not codable concepts without codable children: Secondary | ICD-10-CM

## 2015-06-14 DIAGNOSIS — T84099S Other mechanical complication of unspecified internal joint prosthesis, sequela: Secondary | ICD-10-CM | POA: Diagnosis not present

## 2015-06-14 DIAGNOSIS — T84019S Broken internal joint prosthesis, unspecified site, sequela: Secondary | ICD-10-CM

## 2015-06-14 DIAGNOSIS — Z9660 Presence of unspecified orthopedic joint implant: Secondary | ICD-10-CM | POA: Diagnosis not present

## 2015-06-14 NOTE — Progress Notes (Signed)
Patient ID: Beth [name], female   DOB: 1946-08-11, 69 y.o.   MRN: [id number]   06/14/15  Facility:  Nursing Home Location:  [place] Room Number: 1204-P LEVEL OF CARE:  SNF (31)   Chief Complaint  Patient presents with  . 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 She has been admitted to [facility] on 05/26/15 from [facility] with failed right total knee arthroplasty S/P right knee arthroplasty reimplantation. She has PMH of hypertension, CVA 2006 (was weak on the right side but no residual now) and hyperlipidemia.  She is currently taking Doxycycline for right knee surgical wound infection. No erythema noted. Slight serosanguinous drainage noted on dressing. Patient reports that her pain is well-controlled.  Patient was admitted to this facility for short-term rehabilitation after the patient's recent hospitalization.  Patient has completed SNF rehabilitation and therapy has cleared the patient for discharge.   PAST MEDICAL HISTORY:  Past Medical History  Diagnosis Date  . Hypertension   . CVA (cerebral infarction)     2006  . Hyperlipidemia   . Blood transfusion without reported diagnosis 2012    anemia;pt denies transfusion stated was only on iron tablet  . Heart murmur   . Nocturia     3-4 times per night  . Gout     left elbow  . Arthritis     Knee both knees  . Anemia   . 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 08-09-14 Right eye  . Family history of anesthesia complication     sister very slow to awaken after anesthesia;severe vomiting   . Stroke 2006    x 1 no deficits noted     CURRENT  MEDICATIONS: Reviewed per MAR/see medication list  Allergies  Allergen Reactions  . Aleve [Naproxen Sodium]     Heart races  . Clonidine Derivatives Other (See Comments)    Dizziness and weakness  . Shellfish Allergy Nausea And Vomiting     REVIEW OF SYSTEMS:  GENERAL: no change in appetite, no fatigue, no weight changes, no fever, chills or weakness RESPIRATORY: no cough, SOB, DOE, wheezing, hemoptysis CARDIAC: no chest pain,  or palpitations GI: no abdominal pain, diarrhea, heart burn, nausea or vomiting  PHYSICAL EXAMINATION  GENERAL: no acute distress, normal body habitus SKIN:  Right knee surgical incision has slight serosanguineous drainage; no erythema NECK: supple, trachea midline, no neck masses, no thyroid tenderness, no thyromegaly LYMPHATICS: no LAN in the neck, no supraclavicular LAN RESPIRATORY: breathing is even & unlabored, BS CTAB CARDIAC: RRR, no murmur,no extra heart sounds, RLE edema 2+ GI: abdomen soft, normal BS, no masses, no tenderness, no hepatomegaly, no splenomegaly EXTREMITIES: Able to move 4 extremities; has RLE immobilizer PSYCHIATRIC: the patient is alert & oriented to person, affect & behavior appropriate  LABS/RADIOLOGY: Labs reviewed: 06/05/15  WBC 9.2 hemoglobin 7.9 hematocrit 24.8 MCV 75.6 platelet 825 Basic Metabolic Panel:  Recent Labs  04/30/15 1420 05/24/15 0418 05/25/15 0420 06/02/15 1856  NA 136 135 132* 134*  K 3.9 4.5 4.3 3.7  CL 99* 101 99* 99*  CO2 29 25 27   --   GLUCOSE 152* 244* 194*  134*  BUN 18 23* 30* 16  CREATININE 1.00 1.13* 1.15* 1.10*  CALCIUM 9.3 8.4* 8.3*  --    Liver Function Tests:  Recent Labs  09/28/14 1500 02/09/15 1320 04/30/15 1420  AST 12 18 27   ALT 10 11 31   ALKPHOS 106 119* 126  BILITOT 0.7 0.8 0.6  PROT 7.2 8.1 8.3*  ALBUMIN 3.1* 3.6 3.4*   CBC:  Recent Labs  03/19/15 1420 04/18/15 1426  05/25/15 0420 05/26/15 0530 06/02/15 1850 06/02/15 1856  WBC 11.2* 8.9  < > 11.7* 10.2  10.2  --   NEUTROABS 8.8* 6.4  --   --   --  7.2  --   HGB 9.1*  --   < > 7.1* 9.2* 10.3* 11.9*  HCT 30.6* 33.5*  < > 22.7* 29.1* 33.8* 35.0*  MCV 79  --   < > 76.2* 77.8* 79.3  --   PLT 458*  --   < > 298 278 471*  --   < > = values in this interval not displayed.  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 Wynelle Link, orthopedic surgeon  Hypertension - continue Norvasc 10 mg 1 tab by mouth daily evening, Benicar HCT 40-12.5 mg 1 tab by mouth every evening and Bystolic 20 mg by mouth daily  Hyperlipidemia - continue Lipitor 20 mg 1 tab by mouth every evening  Anemia, acute blood loss - hemoglobin 8.5; continue Hemocyte 106 mg by mouth twice a day  Constipation - continue senna S2 tabs by mouth twice a day and MiraLAX 17 g by mouth twice a day  Right knee surgical incision infection - continue Doxycycline 100 mg 1 PO BID X 4 more days    I have filled out patient's discharge paperwork and written prescriptions.  Patient will receive home health PT, OT and Nursing.  Total discharge time: Less than 30 minutes  Discharge time involved coordination of the discharge process with Education officer, museum, nursing staff and therapy department. Medical justification for home health services verified.   [facility], NP [employer] [phone]

## 2015-07-13 ENCOUNTER — Ambulatory Visit: Payer: Medicare Other | Admitting: Family Medicine

## 2015-07-30 ENCOUNTER — Ambulatory Visit (INDEPENDENT_AMBULATORY_CARE_PROVIDER_SITE_OTHER): Payer: Medicare Other | Admitting: Family Medicine

## 2015-07-30 ENCOUNTER — Encounter: Payer: Self-pay | Admitting: Family Medicine

## 2015-07-30 VITALS — BP 186/84 | HR 80 | Temp 97.8°F | Ht 65.0 in | Wt 201.0 lb

## 2015-07-30 DIAGNOSIS — I10 Essential (primary) hypertension: Secondary | ICD-10-CM | POA: Diagnosis not present

## 2015-07-30 DIAGNOSIS — E785 Hyperlipidemia, unspecified: Secondary | ICD-10-CM | POA: Diagnosis not present

## 2015-07-30 DIAGNOSIS — E119 Type 2 diabetes mellitus without complications: Secondary | ICD-10-CM

## 2015-07-30 MED ORDER — NEBIVOLOL HCL 20 MG PO TABS
1.0000 | ORAL_TABLET | Freq: Every evening | ORAL | Status: DC
Start: 2015-07-30 — End: 2016-03-07

## 2015-07-30 MED ORDER — METFORMIN HCL ER 500 MG PO TB24: 500.0000 mg | ORAL_TABLET | Freq: Every day | ORAL | Status: DC

## 2015-07-30 NOTE — Progress Notes (Signed)
Subjective:  Patient ID: Beth [name], female    DOB: 12-19-1945  Age: 69 y.o. MRN: [id number]  CC: Hypertension and Hyperlipidemia   HPI [name] presents for  follow-up of hypertension. Patient has no history of headache chest pain or shortness of breath or recent cough. Patient also denies symptoms of TIA such as numbness weakness lateralizing. Patient checks  blood pressure at home and has not had any elevated readings recently. Patient denies side effects from his medication. States taking it regularly.  Patient also  in for follow-up of elevated cholesterol. Doing well without complaints on current medication. Denies side effects of statin including myalgia and arthralgia and nausea. Also in today for liver function testing. Currently no chest pain, shortness of breath or other cardiovascular related symptoms noted. Patient was noted during recent hospitalization to have some elevated blood sugars. Therefore hemoglobin A1c was drawn and patient was noted to have 6.6 indicative of diabetes. She does not have a history of diabetes although it does run in the family. She denies any diabetic symptoms currently including polydipsia polyuria nausea and vomiting. She is not excessively hungry. She is recuperating from knee surgery. Apparently she had some steroid-induced during the time of her hospitalization for her knee.   History Caira has a past medical history of Hypertension; CVA (cerebral infarction); Hyperlipidemia; Blood transfusion without reported diagnosis (2012); Heart murmur; Nocturia; Gout; Arthritis; Anemia; Herpes infection (08-09-14); Family history of anesthesia complication; and Stroke (2006).   She has past surgical history that includes Carpal tunnel release (Right, 1983); Abdominal hysterectomy (1983); colonscopy (June 21, 2014); nasal cauterization (2012); Total knee arthroplasty (Right, 07/03/2014); Patellar tendon repair (Right, 08/11/2014); Joint replacement (06/2014); I&D  knee with poly exchange (Right, 10/02/2014); Excisional total knee arthroplasty with antibiotic spacers (Right, 02/16/2015); and Reimplantation of total knee (Right, 05/23/2015).   Her family history includes Brain cancer in her mother; Diabetes in her son and son; Ovarian cancer in her mother. There is no history of Colon cancer, Esophageal cancer, Stomach cancer, or Rectal cancer.She reports that she has never smoked. She has never used smokeless tobacco. She reports that she does not drink alcohol or use illicit drugs.  Current Outpatient Prescriptions on File Prior to Visit  Medication Sig Dispense Refill  . amLODipine (NORVASC) 10 MG tablet Take 1 tablet (10 mg total) by mouth every evening. 90 tablet 4  . atorvastatin (LIPITOR) 20 MG tablet Take 1 tablet (20 mg total) by mouth every evening. 90 tablet 4  . ferrous fumarate (HEMOCYTE) 325 (106 FE) MG TABS tablet Take 1 tablet (106 mg of iron total) by mouth 2 (two) times daily. 60 each 2  . methocarbamol (ROBAXIN) 500 MG tablet Take 1 tablet (500 mg total) by mouth every 6 (six) hours as needed for muscle spasms. 40 tablet 1  . olmesartan-hydrochlorothiazide (BENICAR HCT) 40-12.5 MG per tablet Take 1 tablet by mouth every evening. 90 tablet 4  . oxyCODONE (OXY IR/ROXICODONE) 5 MG immediate release tablet Take one tablet by mouth every 3 hours as needed for moderate pain and take two tablets by mouth every 3 hours as needed for severe pain 360 tablet 0  . polyethylene glycol (MIRALAX / GLYCOLAX) packet Take 17 g by mouth 2 (two) times daily.    . traMADol (ULTRAM) 50 MG tablet Take 1-2 tablets (50-100 mg total) by mouth every 6 (six) hours as needed for moderate pain. 60 tablet 0   Current Facility-Administered Medications on File Prior to Visit  Medication Dose Route Frequency Provider Last Rate Last Dose  . tranexamic acid (CYKLOKAPRON) 2,000 mg in sodium chloride 0.9 % 50 mL Topical Application  9,767 mg Topical Once [name], MD         ROS Review of Systems  Constitutional: Negative for fever, chills, diaphoresis, appetite change, fatigue and unexpected weight change.  HENT: Negative for congestion, ear pain, hearing loss, postnasal drip, rhinorrhea, sneezing, sore throat and trouble swallowing.   Eyes: Negative for pain.  Respiratory: Negative for cough, chest tightness and shortness of breath.   Cardiovascular: Negative for chest pain and palpitations.  Gastrointestinal: Negative for nausea, vomiting, abdominal pain, diarrhea and constipation.  Genitourinary: Negative for dysuria, frequency and menstrual problem.  Musculoskeletal: Positive for joint swelling and arthralgias.  Skin: Negative for rash.  Neurological: Negative for dizziness, weakness, numbness and headaches.  Psychiatric/Behavioral: Negative for dysphoric mood and agitation.    Objective:  BP 186/84 mmHg  Pulse 80  Temp(Src) 97.8 F (36.6 C) (Oral)  Ht _0  (1.651 m)  Wt 201 lb (91.173 kg)  BMI 33.45 kg/m2  BP Readings from Last 3 Encounters:  07/30/15 186/84  06/14/15 132/70  06/02/15 164/85    Wt Readings from Last 3 Encounters:  07/30/15 201 lb (91.173 kg)  06/14/15 208 lb 9.6 oz (94.62 kg)  05/29/15 208 lb 9.6 oz (94.62 kg)     Physical Exam  Constitutional: She is oriented to person, place, and time. She appears well-developed and well-nourished. No distress.  HENT:  Head: Normocephalic and atraumatic.  Right Ear: External ear normal.  Left Ear: External ear normal.  Nose: Nose normal.  Mouth/Throat: Oropharynx is clear and moist.  Eyes: Conjunctivae and EOM are normal. Pupils are equal, round, and reactive to light.  Neck: Normal range of motion. Neck supple. No thyromegaly present.  Cardiovascular: Normal rate, regular rhythm and normal heart sounds.   No murmur heard. Pulmonary/Chest: Effort normal and breath sounds normal. No respiratory distress. She has no wheezes. She has no rales.  Abdominal: Soft. Bowel sounds  are normal. She exhibits no distension. There is no tenderness.  Musculoskeletal: She exhibits edema (There is 3-4+ edema surrounding the right knee joint).  Lymphadenopathy:    She has no cervical adenopathy.  Neurological: She is alert and oriented to person, place, and time. She has normal reflexes.  Skin: Skin is warm and dry.  Psychiatric: She has a normal mood and affect. Her behavior is normal. Judgment and thought content normal.    Lab Results  Component Value Date   HGBA1C 6.1 06/21/2013      No results found.  Assessment & Plan:   Lujuana was seen today for hypertension and hyperlipidemia.  Diagnoses and all orders for this visit:  HLD (hyperlipidemia) -     CBC with Differential/Platelet -     CMP14+EGFR -     Lipid panel -     Amb Referral to Nutrition and Diabetic E  Essential hypertension -     CBC with Differential/Platelet -     CMP14+EGFR -     Lipid panel -     Amb Referral to Nutrition and Diabetic E  Type 2 diabetes mellitus without complication -     CBC with Differential/Platelet -     CMP14+EGFR -     Lipid panel -     Amb Referral to Nutrition and Diabetic E  Other orders -     Nebivolol HCl (BYSTOLIC) 20 MG TABS; Take 1 tablet (  20 mg total) by mouth every evening. -     metFORMIN (GLUCOPHAGE-XR) 500 MG 24 hr tablet; Take 1 tablet (500 mg total) by mouth daily with breakfast.   I have discontinued Ms. Isham's rivaroxaban and senna. I have also changed her Nebivolol HCl. Additionally, I am having her start on metFORMIN. Lastly, I am having her maintain her amLODipine, atorvastatin, olmesartan-hydrochlorothiazide, ferrous fumarate, traMADol, methocarbamol, polyethylene glycol, and oxyCODONE.  Meds ordered this encounter  Medications  . Nebivolol HCl (BYSTOLIC) 20 MG TABS    Sig: Take 1 tablet (20 mg total) by mouth every evening.    Dispense:  90 tablet    Refill:  1  . metFORMIN (GLUCOPHAGE-XR) 500 MG 24 hr tablet    Sig: Take 1 tablet (500  mg total) by mouth daily with breakfast.    Dispense:  30 tablet    Refill:  2     Follow-up: Return in about 1 week (around 08/06/2015) for diabetes.  Claretta Fraise, M.D.

## 2015-07-30 NOTE — Patient Instructions (Signed)

## 2015-07-31 LAB — CMP14+EGFR
ALT: 13 IU/L (ref 0–32)
AST: 16 IU/L (ref 0–40)
Albumin/Globulin Ratio: 0.9 — ABNORMAL LOW (ref 1.1–2.5)
Albumin: 3.7 g/dL (ref 3.5–4.8)
Alkaline Phosphatase: 137 IU/L — ABNORMAL HIGH (ref 39–117)
BUN/Creatinine Ratio: 19 (ref 11–26)
BUN: 18 mg/dL (ref 8–27)
Bilirubin Total: 0.5 mg/dL (ref 0.0–1.2)
CO2: 27 mmol/L (ref 18–29)
Calcium: 9.2 mg/dL (ref 8.7–10.3)
Chloride: 98 mmol/L (ref 97–108)
Creatinine, Ser: 0.94 mg/dL (ref 0.57–1.00)
GFR calc non Af Amer: 61 mL/min/{1.73_m2} (ref >59)
GFR, EST AFRICAN AMERICAN: 71 mL/min/{1.73_m2} (ref >59)
Globulin, Total: 4.1 g/dL (ref 1.5–4.5)
Glucose: 124 mg/dL — ABNORMAL HIGH (ref 65–99)
POTASSIUM: 4.4 mmol/L (ref 3.5–5.2)
Sodium: 138 mmol/L (ref 134–144)
TOTAL PROTEIN: 7.8 g/dL (ref 6.0–8.5)

## 2015-07-31 LAB — CBC WITH DIFFERENTIAL/PLATELET
Basophils Absolute: 0 10*3/uL (ref 0.0–0.2)
Basos: 0 %
EOS (ABSOLUTE): 0.2 10*3/uL (ref 0.0–0.4)
EOS: 2 %
HEMATOCRIT: 34.5 % (ref 34.0–46.6)
HEMOGLOBIN: 10.6 g/dL — AB (ref 11.1–15.9)
IMMATURE GRANS (ABS): 0 10*3/uL (ref 0.0–0.1)
IMMATURE GRANULOCYTES: 0 %
LYMPHS ABS: 1.7 10*3/uL (ref 0.7–3.1)
Lymphs: 24 %
MCH: 23.5 pg — ABNORMAL LOW (ref 26.6–33.0)
MCHC: 30.7 g/dL — ABNORMAL LOW (ref 31.5–35.7)
MCV: 77 fL — ABNORMAL LOW (ref 79–97)
MONOCYTES: 10 %
Monocytes Absolute: 0.7 10*3/uL (ref 0.1–0.9)
NEUTROS PCT: 64 %
Neutrophils Absolute: 4.5 10*3/uL (ref 1.4–7.0)
Platelets: 425 10*3/uL — ABNORMAL HIGH (ref 150–379)
RBC: 4.51 x10E6/uL (ref 3.77–5.28)
RDW: 16.8 % — ABNORMAL HIGH (ref 12.3–15.4)
WBC: 7.1 10*3/uL (ref 3.4–10.8)

## 2015-07-31 LAB — LIPID PANEL
Chol/HDL Ratio: 2.8 ratio units (ref 0.0–4.4)
Cholesterol, Total: 148 mg/dL (ref 100–199)
HDL: 52 mg/dL (ref >39)
LDL Calculated: 81 mg/dL (ref 0–99)
Triglycerides: 77 mg/dL (ref 0–149)
VLDL Cholesterol Cal: 15 mg/dL (ref 5–40)

## 2015-08-01 ENCOUNTER — Other Ambulatory Visit: Payer: Self-pay | Admitting: *Deleted

## 2015-08-01 DIAGNOSIS — E611 Iron deficiency: Secondary | ICD-10-CM

## 2015-08-06 ENCOUNTER — Encounter: Payer: Self-pay | Admitting: Family Medicine

## 2015-08-06 ENCOUNTER — Ambulatory Visit (INDEPENDENT_AMBULATORY_CARE_PROVIDER_SITE_OTHER): Payer: Medicare Other | Admitting: Family Medicine

## 2015-08-06 ENCOUNTER — Ambulatory Visit: Payer: Medicare Other | Attending: Orthopedic Surgery | Admitting: Physical Therapy

## 2015-08-06 VITALS — BP 159/74 | HR 66 | Temp 97.7°F | Ht 65.0 in | Wt 201.0 lb

## 2015-08-06 DIAGNOSIS — E119 Type 2 diabetes mellitus without complications: Secondary | ICD-10-CM

## 2015-08-06 DIAGNOSIS — M25561 Pain in right knee: Secondary | ICD-10-CM | POA: Diagnosis present

## 2015-08-06 DIAGNOSIS — M25661 Stiffness of right knee, not elsewhere classified: Secondary | ICD-10-CM

## 2015-08-06 MED ORDER — BLOOD GLUCOSE MONITOR KIT: PACK | Status: DC

## 2015-08-06 NOTE — Progress Notes (Signed)
Subjective:  Patient ID: Beth [name], female    DOB: 26-Nov-1946  Age: 69 y.o. MRN: [id number]  CC: Diabetes   HPI Beth [name] presents forFollow-up of diabetes. Patient does not check blood sugar at home Patient denies symptoms such as polyuria, polydipsia, excessive hunger, nausea No significant hypoglycemic spells noted. Medications as noted below. Taking them regularly without complication/adverse reaction being reported today.   History Beth [name] has a past medical history of Hypertension; CVA (cerebral infarction); Hyperlipidemia; Blood transfusion without reported diagnosis (2012); Heart murmur; Nocturia; Gout; Arthritis; Anemia; Herpes infection (08-09-14); Family history of anesthesia complication; and Stroke (2006).   She has past surgical history that includes Carpal tunnel release (Right, 1983); Abdominal hysterectomy (1983); colonscopy (June 21, 2014); nasal cauterization (2012); Total knee arthroplasty (Right, 07/03/2014); Patellar tendon repair (Right, 08/11/2014); Joint replacement (06/2014); I&D knee with poly exchange (Right, 10/02/2014); Excisional total knee arthroplasty with antibiotic spacers (Right, 02/16/2015); and Reimplantation of total knee (Right, 05/23/2015).   Her family history includes Brain cancer in her mother; Diabetes in her son and son; Ovarian cancer in her mother. There is no history of Colon cancer, Esophageal cancer, Stomach cancer, or Rectal cancer.She reports that she has never smoked. She has never used smokeless tobacco. She reports that she does not drink alcohol or use illicit drugs.  Current Outpatient Prescriptions on File Prior to Visit  Medication Sig Dispense Refill  . amLODipine (NORVASC) 10 MG tablet Take 1 tablet (10 mg total) by mouth every evening. 90 tablet 4  . atorvastatin (LIPITOR) 20 MG tablet Take 1 tablet (20 mg total) by mouth every evening. 90 tablet 4  . ferrous fumarate (HEMOCYTE) 325 (106 FE) MG TABS tablet Take 1 tablet (106 mg of  iron total) by mouth 2 (two) times daily. 60 each 2  . methocarbamol (ROBAXIN) 500 MG tablet Take 1 tablet (500 mg total) by mouth every 6 (six) hours as needed for muscle spasms. 40 tablet 1  . Nebivolol HCl (BYSTOLIC) 20 MG TABS Take 1 tablet (20 mg total) by mouth every evening. 90 tablet 1  . olmesartan-hydrochlorothiazide (BENICAR HCT) 40-12.5 MG per tablet Take 1 tablet by mouth every evening. 90 tablet 4  . oxyCODONE (OXY IR/ROXICODONE) 5 MG immediate release tablet Take one tablet by mouth every 3 hours as needed for moderate pain and take two tablets by mouth every 3 hours as needed for severe pain 360 tablet 0  . polyethylene glycol (MIRALAX / GLYCOLAX) packet Take 17 g by mouth 2 (two) times daily.    . traMADol (ULTRAM) 50 MG tablet Take 1-2 tablets (50-100 mg total) by mouth every 6 (six) hours as needed for moderate pain. 60 tablet 0  . metFORMIN (GLUCOPHAGE-XR) 500 MG 24 hr tablet Take 1 tablet (500 mg total) by mouth daily with breakfast. (Patient not taking: Reported on 08/06/2015) 30 tablet 2   Current Facility-Administered Medications on File Prior to Visit  Medication Dose Route Frequency Provider Last Rate Last Dose  . tranexamic acid (CYKLOKAPRON) 2,000 mg in sodium chloride 0.9 % 50 mL Topical Application  3,475 mg Topical Once [name], MD          ROS Review of Systems  Constitutional: Negative for fever, chills, diaphoresis, appetite change, fatigue and unexpected weight change.  HENT: Negative for congestion, ear pain, hearing loss, postnasal drip, rhinorrhea, sneezing, sore throat and trouble swallowing.   Eyes: Negative for pain.  Respiratory: Negative for cough, chest tightness and shortness of breath.  Cardiovascular: Negative for chest pain and palpitations.  Gastrointestinal: Negative for nausea, vomiting, abdominal pain, diarrhea and constipation.  Genitourinary: Negative for dysuria, frequency and menstrual problem.  Musculoskeletal: Negative for joint  swelling and arthralgias.  Skin: Negative for rash.  Neurological: Negative for dizziness, weakness, numbness and headaches.  Psychiatric/Behavioral: Negative for dysphoric mood and agitation.    Objective:  BP 159/74 mmHg  Pulse 66  Temp(Src) 97.7 F (36.5 C) (Oral)  Ht 5' 5" (1.651 m)  Wt 201 lb (91.173 kg)  BMI 33.45 kg/m2  BP Readings from Last 3 Encounters:  08/06/15 159/74  07/30/15 186/84  06/14/15 132/70    Wt Readings from Last 3 Encounters:  08/06/15 201 lb (91.173 kg)  07/30/15 201 lb (91.173 kg)  06/14/15 208 lb 9.6 oz (94.62 kg)     Physical Exam  Constitutional: She is oriented to person, place, and time. She appears well-developed and well-nourished. No distress.  HENT:  Head: Normocephalic and atraumatic.  Right Ear: External ear normal.  Left Ear: External ear normal.  Nose: Nose normal.  Mouth/Throat: Oropharynx is clear and moist.  Eyes: Conjunctivae and EOM are normal. Pupils are equal, round, and reactive to light.  Neck: Normal range of motion. Neck supple. No thyromegaly present.  Cardiovascular: Normal rate, regular rhythm and normal heart sounds.   No murmur heard. Pulmonary/Chest: Effort normal and breath sounds normal. No respiratory distress. She has no wheezes. She has no rales.  Abdominal: Soft. Bowel sounds are normal. She exhibits no distension. There is no tenderness.  Lymphadenopathy:    She has no cervical adenopathy.  Neurological: She is alert and oriented to person, place, and time. She has normal reflexes.  Skin: Skin is warm and dry.  Psychiatric: She has a normal mood and affect. Her behavior is normal. Judgment and thought content normal.    Lab Results  Component Value Date   HGBA1C 6.1 06/21/2013       Assessment & Plan:   Beth [name] was seen today for diabetes.  Diagnoses and all orders for this visit:  Type 2 diabetes mellitus without complication  Other orders -     blood glucose meter kit and supplies KIT;  Dispense based on patient and insurance preference. Use up to four times daily as directed. (FOR ICD-9 250.00, 250.01).  I am having [name] start on blood glucose meter kit and supplies. I am also having her maintain her amLODipine, atorvastatin, olmesartan-hydrochlorothiazide, ferrous fumarate, traMADol, methocarbamol, polyethylene glycol, oxyCODONE, Nebivolol HCl, and metFORMIN.  Meds ordered this encounter  Medications  . blood glucose meter kit and supplies KIT    Sig: Dispense based on patient and insurance preference. Use up to four times daily as directed. (FOR ICD-9 250.00, 250.01).    Dispense:  1 each    Refill:  0    Order Specific Question:  Number of strips    Answer:  100    Order Specific Question:  Number of lancets    Answer:  100   Check glucose Fasting & PP  Follow-up: Return in about 3 months (around 11/06/2015).  Claretta Fraise, M.D.

## 2015-08-06 NOTE — Therapy (Signed)
[facility] [place], [place], [address] Phone: [phone]   Fax:  [phone]  Physical Therapy Evaluation  Patient Details  Name: Beth [name] MRN: [id number] Date of Birth: Dec 08, 1946 Referring Provider:  [name], MD  Encounter Date: 08/06/2015      PT End of Session - 08/06/15 1721    Visit Number 1   Number of Visits 12   Date for PT Re-Evaluation 09/17/15   PT Start Time 0252   PT Stop Time 0335   PT Time Calculation (min) 43 min   Activity Tolerance Patient tolerated treatment well   Behavior During Therapy [facility] for tasks assessed/performed      Past Medical History  Diagnosis Date  . Hypertension   . CVA (cerebral infarction)     2006  . Hyperlipidemia   . Blood transfusion without reported diagnosis 2012    anemia;pt denies transfusion stated was only on iron tablet  . Heart murmur   . Nocturia     3-4 times per night  . Gout     left elbow  . Arthritis     Knee both knees  . Anemia   . 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 08-09-14 Right eye  . Family history of anesthesia complication     sister very slow to awaken after anesthesia;severe vomiting   . Stroke 2006    x 1 no deficits noted     Past Surgical History  Procedure Laterality Date  . Carpal tunnel release Right 1983  . Abdominal hysterectomy  1983  . Colonscopy  June 21, 2014  . Nasal cauterization  2012  . Total knee arthroplasty Right 07/03/2014    Procedure: RIGHT TOTAL KNEE ARTHROPLASTY;  Surgeon: Gearlean Alf, MD;  Location: [facility];  Service: Orthopedics;  Laterality: Right;  . Patellar tendon repair Right 08/11/2014    Procedure: RIGHT PATELLA TENDON REPAIR;  Surgeon: Gearlean Alf, MD;  Location: [facility];  Service: Orthopedics;  Laterality: Right;  . Joint replacement  06/2014    right knee  . I&d knee with poly exchange Right 10/02/2014    Procedure: IRRIGATION AND DEBRIDEMENT RIGHT KNEE WITH POLY  EXCHANGE;  Surgeon: Gearlean Alf, MD;  Location: [facility];  Service: Orthopedics;  Laterality: Right;  . Excisional total knee arthroplasty with antibiotic spacers Right 02/16/2015    Procedure: RIGHT KNEE RESECTION ARTHROPLASTY WITH ANTIBIOTIC SPACERS;  Surgeon: [name], MD;  Location: [facility];  Service: Orthopedics;  Laterality: Right;  . Reimplantation of total knee Right 05/23/2015    Procedure: RIGHT KNEE ARTHROPLASTY REIMPLANTATION;  Surgeon: [name], MD;  Location: [facility];  Service: Orthopedics;  Laterality: Right;    There were no vitals filed for this visit.  Visit Diagnosis:  Right knee pain - Plan: PT plan of care cert/re-cert  Knee stiffness, right - Plan: PT plan of care cert/re-cert          [facility] PT Assessment - 08/06/15 0001    Assessment   Medical Diagnosis Right total knee arthroplasty revision.   Onset Date/Surgical Date --  05/24/15.   Next MD Visit --  08/07/15.   Precautions   Precautions --  No U/S.   Restrictions   Weight Bearing Restrictions No   Balance Screen   Has the patient fallen in the past 6 months Yes   How many times? 1   Has the patient had a decrease in activity level because of a  fear of falling?  No   Is the patient reluctant to leave their home because of a fear of falling?  No   Home [employer] residence   Prior Function   Level of Independence Independent   Observation/Other Assessments-Edema    Edema Circumferential   Circumferential Edema   Circumferential - Right 46.5 cms   Circumferential - Left  39.5 cms   ROM / Strength   AROM / PROM / Strength AROM;PROM;Strength   AROM   Overall AROM Comments -15 degrees of right knee extension, however, this is essentially equal to the patient's left knee.  The patient's active right knee flexion= 92 degrees and passive= 96 degrees.   Strength   Overall Strength Comments Right hip strength= 3-/5 and right knee strength= 2+ to 3-/5.   Palpation    Palpation comment Diffuse palpable right knee pain.   Ambulation/Gait   Gait Comments Patient ambulates independent with a FWW.                   [facility] PT Treatment/Exercise - 2015-08-31 0001    Modalities   Modalities Vasopneumatic   Vasopneumatic   Number Minutes Vasopneumatic  15 minutes   Vasopnuematic Location  --  Right knee.   Vasopneumatic Pressure Medium                  PT Short Term Goals - Aug 31, 2015 1740    PT SHORT TERM GOAL #1   Title Ind with HEP.   Time 2   Period Weeks   Status New           PT Long Term Goals - 2015-08-31 1740    PT LONG TERM GOAL #1   Title Right hip and knee strength= 4/5 to increase stability for functional activites.   Time 4   Period Weeks   Status New   PT LONG TERM GOAL #2   Title Active right knee flexion= 115-120 degrees.   Time 4   Period Weeks   Status New   PT LONG TERM GOAL #3   Title Decrease edema to within 2.5 cms of contralateral side to assist with range of motion gains and decrease pain.   Time 4   Period Weeks   Status New               Plan - August 31, 2015 1722    Clinical Impression Statement The patient presents to physical therapy s/p right total knee revision on 05/24/15.  Her original surgery was on 07/03/14 but unfortunately fell and had to undergo a patellar tendon repair on 08/11/14.  Unfortuantely she got an infection and had a couple more surgeries related to this.  She reports no pin at rest today.   Pt will benefit from skilled therapeutic intervention in order to improve on the following deficits Pain;Decreased activity tolerance;Increased edema;Decreased strength;Decreased range of motion   Rehab Potential Good   PT Frequency 2x / week   PT Duration 4 weeks   PT Treatment/Interventions Vasopneumatic Device;Cryotherapy;Therapeutic activities;Therapeutic exercise;Neuromuscular re-education;Patient/family education;Manual techniques   PT Next Visit Plan Total knee protocol.           G-Codes - 08-31-2015 1735    Functional Assessment Tool Used Clinical judgement.   Functional Limitation Mobility: Walking and moving around   Mobility: Walking and Moving Around Current Status [id number]) At least 60 percent but less than 80 percent impaired, limited or restricted   Mobility: Walking and Moving Around  Goal Status [phone]) At least 20 percent but less than 40 percent impaired, limited or restricted       Problem List Patient Active Problem List   Diagnosis Date Noted  . Malnutrition of moderate degree 05/25/2015  . Postoperative anemia due to acute blood loss 05/25/2015  . Septic arthritis of knee, right 02/16/2015  . Patellar tendon rupture 08/11/2014  . OA (osteoarthritis) of knee 07/03/2014  . HLD (hyperlipidemia) 01/04/2014  . HTN (hypertension) 01/04/2014  . Anemia, iron deficiency 12/30/2011  . Constipation 04/04/2009  . TUBULOVILLOUS ADENOMA, COLON 04/03/2009    Chip Canepa, [place] MPT 08/06/2015, 5:45 PM  [facility] [address] [place], [place], [address] Phone: [phone]   Fax:  [phone]

## 2015-08-08 ENCOUNTER — Ambulatory Visit: Payer: Medicare Other | Admitting: Pharmacist

## 2015-08-08 ENCOUNTER — Ambulatory Visit: Payer: Medicare Other | Admitting: Physical Therapy

## 2015-08-08 DIAGNOSIS — M25561 Pain in right knee: Secondary | ICD-10-CM

## 2015-08-08 DIAGNOSIS — M25661 Stiffness of right knee, not elsewhere classified: Secondary | ICD-10-CM

## 2015-08-08 NOTE — Therapy (Signed)
[facility] [place], [place], [address] Phone: [phone]   Fax:  [phone]  Physical Therapy Treatment  Patient Details  Name: Beth [name] MRN: [id number] Date of Birth: 10-Jan-1946 Referring Provider:  Claretta Fraise, MD  Encounter Date: 08/08/2015      PT End of Session - 08/08/15 1340    Visit Number 2   Number of Visits 12   Date for PT Re-Evaluation 09/17/15   PT Start Time 9983   PT Stop Time 1346   PT Time Calculation (min) 48 min   Activity Tolerance Patient tolerated treatment well   Behavior During Therapy [facility] for tasks assessed/performed      Past Medical History  Diagnosis Date  . Hypertension   . CVA (cerebral infarction)     2006  . Hyperlipidemia   . Blood transfusion without reported diagnosis 2012    anemia;pt denies transfusion stated was only on iron tablet  . Heart murmur   . Nocturia     3-4 times per night  . Gout     left elbow  . Arthritis     Knee both knees  . Anemia   . 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 08-09-14 Right eye  . Family history of anesthesia complication     sister very slow to awaken after anesthesia;severe vomiting   . Stroke 2006    x 1 no deficits noted     Past Surgical History  Procedure Laterality Date  . Carpal tunnel release Right 1983  . Abdominal hysterectomy  1983  . Colonscopy  June 21, 2014  . Nasal cauterization  2012  . Total knee arthroplasty Right 07/03/2014    Procedure: RIGHT TOTAL KNEE ARTHROPLASTY;  Surgeon: Gearlean Alf, MD;  Location: [facility];  Service: Orthopedics;  Laterality: Right;  . Patellar tendon repair Right 08/11/2014    Procedure: RIGHT PATELLA TENDON REPAIR;  Surgeon: Gearlean Alf, MD;  Location: [facility];  Service: Orthopedics;  Laterality: Right;  . Joint replacement  06/2014    right knee  . I&d knee with poly exchange Right 10/02/2014    Procedure: IRRIGATION AND DEBRIDEMENT RIGHT KNEE WITH POLY EXCHANGE;   Surgeon: Gearlean Alf, MD;  Location: [facility];  Service: Orthopedics;  Laterality: Right;  . Excisional total knee arthroplasty with antibiotic spacers Right 02/16/2015    Procedure: RIGHT KNEE RESECTION ARTHROPLASTY WITH ANTIBIOTIC SPACERS;  Surgeon: [name], MD;  Location: [facility];  Service: Orthopedics;  Laterality: Right;  . Reimplantation of total knee Right 05/23/2015    Procedure: RIGHT KNEE ARTHROPLASTY REIMPLANTATION;  Surgeon: [name], MD;  Location: [facility];  Service: Orthopedics;  Laterality: Right;    There were no vitals filed for this visit.  Visit Diagnosis:  Right knee pain  Knee stiffness, right      Subjective Assessment - 08/08/15 1335    Subjective No new complaints.                                   PT Short Term Goals - 08/06/15 1740    PT SHORT TERM GOAL #1   Title Ind with HEP.   Time 2   Period Weeks   Status New           PT Long Term Goals - 08/06/15 1740    PT LONG TERM GOAL #1   Title Right  hip and knee strength= 4/5 to increase stability for functional activites.   Time 4   Period Weeks   Status New   PT LONG TERM GOAL #2   Title Active right knee flexion= 115-120 degrees.   Time 4   Period Weeks   Status New   PT LONG TERM GOAL #3   Title Decrease edema to within 2.5 cms of contralateral side to assist with range of motion gains and decrease pain.   Time 4   Period Weeks   Status New               Problem List Patient Active Problem List   Diagnosis Date Noted  . Malnutrition of moderate degree 05/25/2015  . Postoperative anemia due to acute blood loss 05/25/2015  . Septic arthritis of knee, right 02/16/2015  . Patellar tendon rupture 08/11/2014  . OA (osteoarthritis) of knee 07/03/2014  . HLD (hyperlipidemia) 01/04/2014  . HTN (hypertension) 01/04/2014  . Anemia, iron deficiency 12/30/2011  . Constipation 04/04/2009  . TUBULOVILLOUS ADENOMA, COLON 04/03/2009  Treatment:   Nustep at level 3 moving seat forward times one over 15 minutes f/b PROM to patient's right knee in supine x 8 minutes f/b elevation with medium vasopneumatic x 15 minutes.  Brandell Maready, [place] MPT 08/08/2015, 1:47 PM  [facility] [address] [place], [place], [address] Phone: [phone]   Fax:  [phone]

## 2015-08-10 ENCOUNTER — Encounter: Payer: Self-pay | Admitting: Pharmacist

## 2015-08-10 ENCOUNTER — Ambulatory Visit (INDEPENDENT_AMBULATORY_CARE_PROVIDER_SITE_OTHER): Payer: Medicare Other | Admitting: Pharmacist

## 2015-08-10 VITALS — BP 150/80 | HR 82 | Ht 65.0 in | Wt 200.5 lb

## 2015-08-10 DIAGNOSIS — R7309 Other abnormal glucose: Secondary | ICD-10-CM | POA: Diagnosis not present

## 2015-08-10 DIAGNOSIS — R739 Hyperglycemia, unspecified: Secondary | ICD-10-CM | POA: Diagnosis not present

## 2015-08-10 DIAGNOSIS — Z0000 Encounter for general adult medical examination without abnormal findings: Secondary | ICD-10-CM

## 2015-08-10 DIAGNOSIS — R7303 Prediabetes: Secondary | ICD-10-CM

## 2015-08-10 DIAGNOSIS — E8881 Metabolic syndrome: Secondary | ICD-10-CM | POA: Diagnosis not present

## 2015-08-10 LAB — GLUCOSE, POCT (MANUAL RESULT ENTRY): POC Glucose: 94 mg/dl (ref 70–99)

## 2015-08-10 LAB — POCT GLYCOSYLATED HEMOGLOBIN (HGB A1C): Hemoglobin A1C: 6.3

## 2015-08-10 NOTE — Progress Notes (Signed)
Patient ID: Beth [name], female   DOB: 1946-05-10, 69 y.o.   MRN: [id number]    Subjective:   Beth [name] is a 69 y.o. female who presents for an Initial Medicare Annual Wellness Visit.  She was also referred by her PCP for education regarding pre diabetes - diet, checking BG and prevention of further progression and complications.  Mrs. 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  Over the last year she has had about 5 surgeries to her right knee which were complication post knee replacement.  She started outpatient PT 08/06/2015.  Regarding pre diabetes - patient had an A1c was 6.1% in 2014 but she was never told or diagnosed with pre diabetes.  She was prescribed metformin XR 572m last week because she wanted more clarification about why she was taking and side effects.   Current Medications (verified) Outpatient Encounter Prescriptions as of 08/10/2015  Medication Sig  . amLODipine (NORVASC) 10 MG tablet Take 1 tablet (10 mg total) by mouth every evening.  .Marland Kitchenatorvastatin (LIPITOR) 20 MG tablet Take 1 tablet (20 mg total) by mouth every evening.  . blood glucose meter kit and supplies KIT Dispense based on patient and insurance preference. Use up to four times daily as directed. (FOR ICD-9 250.00, 250.01).  . ferrous fumarate (HEMOCYTE) 325 (106 FE) MG TABS tablet Take 1 tablet (106 mg of iron total) by mouth 2 (two) times daily.  . metFORMIN (GLUCOPHAGE-XR) 500 MG 24 hr tablet Take 1 tablet (500 mg total) by mouth daily with breakfast.  . methocarbamol (ROBAXIN) 500 MG tablet Take 1 tablet (500 mg total) by mouth every 6 (six) hours as needed for muscle spasms.  . Nebivolol HCl (BYSTOLIC) 20 MG TABS Take 1 tablet (20 mg total) by mouth every evening.  . olmesartan-hydrochlorothiazide (BENICAR HCT) 40-12.5 MG per tablet Take 1 tablet by mouth every evening.  . ONE TOUCH ULTRA TEST test strip   . ONETOUCH DELICA LANCETS 363SMISC   . oxyCODONE (OXY IR/ROXICODONE) 5 MG  immediate release tablet Take one tablet by mouth every 3 hours as needed for moderate pain and take two tablets by mouth every 3 hours as needed for severe pain  . polyethylene glycol (MIRALAX / GLYCOLAX) packet Take 17 g by mouth 2 (two) times daily.  . traMADol (ULTRAM) 50 MG tablet Take 1-2 tablets (50-100 mg total) by mouth every 6 (six) hours as needed for moderate pain.   Facility-Administered Encounter Medications as of 08/10/2015  Medication  . tranexamic acid (CYKLOKAPRON) 2,000 mg in sodium chloride 0.9 % 50 mL Topical Application    Allergies (verified) Aleve; Clonidine derivatives; and Shellfish allergy   History: Past Medical History  Diagnosis Date  . Hypertension   . CVA (cerebral infarction)     2006  . Hyperlipidemia   . Blood transfusion without reported diagnosis 2012    anemia;pt denies transfusion stated was only on iron tablet  . Heart murmur   . Nocturia     3-4 times per night  . Gout     left elbow  . Arthritis     Knee both knees  . Anemia   . 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 08-09-14 Right eye  . Family history of anesthesia complication     sister very slow to awaken after anesthesia;severe vomiting   . Stroke 2006    x 1 no deficits noted   . Cataract  left   Past Surgical History  Procedure Laterality Date  . Carpal tunnel release Right 1983  . Abdominal hysterectomy  1983  . Colonscopy  June 21, 2014  . Nasal cauterization  2012  . Total knee arthroplasty Right 07/03/2014    Procedure: RIGHT TOTAL KNEE ARTHROPLASTY;  Surgeon: Gearlean Alf, MD;  [facility];  Service: Orthopedics;  Laterality: Right;  . Patellar tendon repair Right 08/11/2014    Procedure: RIGHT PATELLA TENDON REPAIR;  Surgeon: Gearlean Alf, MD;  [facility];  Service: Orthopedics;  Laterality: Right;  . Joint replacement  06/2014    right knee  . I&d knee with poly exchange Right 10/02/2014    Procedure: IRRIGATION AND DEBRIDEMENT RIGHT KNEE  WITH POLY EXCHANGE;  Surgeon: Gearlean Alf, MD;  [facility];  Service: Orthopedics;  Laterality: Right;  . Excisional total knee arthroplasty with antibiotic spacers Right 02/16/2015    Procedure: RIGHT KNEE RESECTION ARTHROPLASTY WITH ANTIBIOTIC SPACERS;  Surgeon: [name], MD;  [facility];  Service: Orthopedics;  Laterality: Right;  . Reimplantation of total knee Right 05/23/2015    Procedure: RIGHT KNEE ARTHROPLASTY REIMPLANTATION;  Surgeon: [name], MD;  [facility];  Service: Orthopedics;  Laterality: Right;   Family History  Problem Relation Age of Onset  . Ovarian cancer Mother   . Diabetes Son   . Diabetes Son   . Colon cancer Neg Hx   . Esophageal cancer Neg Hx   . Stomach cancer Neg Hx   . Rectal cancer Neg Hx   . Peripheral vascular disease Father     with amputation of both legs  . Hypertension Father   . Heart disease Brother   . Kidney disease Daughter    Social History   Occupational History  .     Social History Main Topics  . Smoking status: Never Smoker   . Smokeless tobacco: Never Used  . Alcohol Use: No  . Drug Use: No  . Sexual Activity: No    Do you feel safe at home?  Yes  Dietary issues and exercise activities: Current Exercise Habits:: Home exercise routine, Type of exercise: stretching;Other - see comments (doing exercises recommended by outpatient physcial therapy), Time (Minutes): 45, Frequency (Times/Week): 2, Weekly Exercise (Minutes/Week): 90, Intensity: Mild  Current Dietary habits:  Not following any specific diet at this time   Objective:    Today's Vitals   08/10/15 1530  BP: 150/80  Pulse: 82  Height: $Remove'5\' 5"'LIUPqZa$  (1.651 m)  Weight: 200 lb 8 oz (90.946 kg)  PainSc: 0-No pain   Body mass index is 33.36 kg/(m^2).   FBG was 94 in office today A1c = 6.3% today  Activities of Daily Living In your present state of health, do you have any difficulty performing the following activities: 08/10/2015 05/24/2015    Hearing? N -  Vision? N -  Difficulty concentrating or making decisions? N -  Walking or climbing stairs? Y -  Dressing or bathing? N -  Doing errands, shopping? Y N  Preparing Food and eating ? N -  Using the Toilet? N -  In the past six months, have you accidently leaked urine? N -  Do you have problems with loss of bowel control? N -  Managing your Medications? N -  Managing your Finances? N -  Housekeeping or managing your Housekeeping? Y -    Are there smokers in your home (other than you)? No   Cardiac Risk  Factors include: advanced age (>18mn, >>54women);dyslipidemia;obesity (BMI >30kg/m2);hypertension  Depression Screen PHQ 2/9 Scores 08/10/2015 07/30/2015 03/19/2015  PHQ - 2 Score 0 0 0    Fall Risk Fall Risk  08/10/2015 07/30/2015 03/19/2015 12/30/2013  Falls in the past year? Yes No Yes No  Number falls in past yr: 1 - 2 or more -  Injury with Fall? Yes - Yes -  Risk Factor Category  High Fall Risk - - -  Risk for fall due to : - - Impaired mobility -  Follow up Falls prevention discussed - - -    Cognitive Function: MMSE - Mini Mental State Exam 08/10/2015  Orientation to time 5  Orientation to Place 5  Registration 3  Attention/ Calculation 5  Recall 3  Language- name 2 objects 2  Language- repeat 1  Language- follow 3 step command 3  Language- read & follow direction 1  Write a sentence 1  Copy design 1  Total score 30    Immunizations and Health Maintenance There is no immunization history for the selected administration types on file for this patient. There are no preventive care reminders to display for this patient.  Patient Care Team: WClaretta Fraise MD as PCP - General (Family Medicine)  [facility] MH[facility] Doneat [email or web address] last was in 2014  Indicate any recent Medical Services you may have received from other than [facility] providers in the past year (date may be approximate).    Assessment:    Annual Wellness Visit   Prediabetes / metabolic syndrome   Screening Tests Health Maintenance  Topic Date Due  . INFLUENZA VACCINE  08/30/2015 (Originally 07/09/2015)  . MAMMOGRAM  10/30/2015 (Originally 12/24/1993)  . DEXA SCAN  10/30/2015 (Originally 12/24/2008)  . ZOSTAVAX  10/30/2015 (Originally 12/25/2003)  . TETANUS/TDAP  10/30/2015 (Originally 12/24/1962)  . Hepatitis C Screening  10/30/2015 (Originally 112/[birth year]  . PNA vac Low Risk Adult (1 of 2 - PCV13) 10/30/2015 (Originally 12/24/2008)  . COLONOSCOPY  06/21/2024        Plan:   During the course of the visit NKittiwas educated and counseled about the following appropriate screening and preventive services:   Vaccines to include Pneumoccal, Influenza, Hepatitis B, Td, Zostavax - discussed getting Prevnar 13 and other vaccines.  Patient declined to get today but will consider  Colorectal cancer screening - colonscopy UTD but FOBT needed - test given in office today  Cardiovascular disease screening - EKG UTD;   Lipids - UTD; treated to goal with atorvastatin  BP - elevated today but improved compared to 10 days ago.  She has follow up in 2 weeks.    Diabetes screening - UTD  Bone Denisty / Osteoporosis Screening - will get when knee has healed when will be more comfortable for patient to lay flat on DEXA  Mammogram - last results requested  Glaucoma screening / Diabetic Eye Exam - patient reminded to get exam ASAP  Nutrition counseling - spent 30 minutes discussing CHO counting and serving sizes.  Also discussed limiting high salt foods and increasing vegetables and fruits - DASH type diet  Advanced Directives  - information given  Patient to start metformin XR 5071mdaily - discussed possibly SE's and how to lessen / avoid  Checked BG 2 to 3 times weekly - discussed BG goals.    Patient Instructions (the written plan) were given to the patient.   EcCherre RobinsPHPam [facility] 08/10/2015

## 2015-08-10 NOTE — Patient Instructions (Addendum)
Beth [name] , Thank you for taking time to come for your Medicare Wellness Visit. I appreciate your ongoing commitment to your health goals. Please review the following plan we discussed and let me know if I can assist you in the future.   This is a list of the screening recommended for you and due dates:  Health Maintenance  Topic Date Due  . Flu Shot  08/30/2015*  . Mammogram  10/30/2015 - requesting records  . DEXA scan (bone density measurement)  10/30/2015*  . Shingles Vaccine  10/30/2015*  . Tetanus Vaccine  10/30/2015*  .  Hepatitis C: One time screening is recommended by Center for Disease Control  (CDC) for  adults born from [birth year] through 1967.   10/30/2015*  . Pneumonia vaccines (1 of 2 - PCV13) 10/30/2015*  . Colon Cancer Screening  06/21/2024  *Topic was postponed. The date shown is not the original due date.    Diabetes and Standards of Medical Care   Diabetes is complicated. You may find that your diabetes team includes a dietitian, nurse, diabetes educator, eye doctor, and more. To help everyone know what is going on and to help you get the care you deserve, the following schedule of care was developed to help keep you on track. Below are the tests, exams, vaccines, medicines, education, and plans you will need.  Blood Glucose Goals Prior to meals = 80 - 130 Within 2 hours of the start of a meal = less than 180  HbA1c test (goal is less than 7.0% - your last value was 6.3%) This test shows how well you have controlled your glucose over the past 2 to 3 months. It is used to see if your diabetes management plan needs to be adjusted.   It is performed at least 2 times a year if you are meeting treatment goals.  It is performed 4 times a year if therapy has changed or if you are not meeting treatment goals.  Blood pressure test  This test is performed at every routine medical visit. The goal is less than 140/90 mmHg for most people, but 130/80 mmHg in some cases. Ask your  health care provider about your goal.  Dental exam  Follow up with the dentist regularly.  Eye exam  If you are diagnosed with type 1 diabetes as a child, get an exam upon reaching the age of 58 years or older and have had diabetes for 3 to 5 years. Yearly eye exams are recommended after that initial eye exam.  If you are diagnosed with type 1 diabetes as an adult, get an exam within 5 years of diagnosis and then yearly.  If you are diagnosed with type 2 diabetes, get an exam as soon as possible after the diagnosis and then yearly.  Foot care exam  Visual foot exams are performed at every routine medical visit. The exams check for cuts, injuries, or other problems with the feet.  A comprehensive foot exam should be done yearly. This includes visual inspection as well as assessing foot pulses and testing for loss of sensation.  Check your feet nightly for cuts, injuries, or other problems with your feet. Tell your health care provider if anything is not healing.  Kidney function test (urine microalbumin)  This test is performed once a year.  Type 1 diabetes: The first test is performed 5 years after diagnosis.  Type 2 diabetes: The first test is performed at the time of diagnosis.  A serum creatinine and  estimated glomerular filtration rate (eGFR) test is done once a year to assess the level of chronic kidney disease (CKD), if present.  Lipid profile (cholesterol, HDL, LDL, triglycerides)  Performed every 5 years for most people.  The goal for LDL is less than 100 mg/dL. If you are at high risk, the goal is less than 70 mg/dL.  The goal for HDL is 40 mg/dL to 50 mg/dL for men and 50 mg/dL to 60 mg/dL for women. An HDL cholesterol of 60 mg/dL or higher gives some protection against heart disease.  The goal for triglycerides is less than 150 mg/dL.  Influenza vaccine, pneumococcal vaccine, and hepatitis B vaccine  The influenza vaccine is recommended yearly.  The  pneumococcal vaccine is generally given once in a lifetime. However, there are some instances when another vaccination is recommended. Check with your health care provider.  The hepatitis B vaccine is also recommended for adults with diabetes.  Diabetes self-management education  Education is recommended at diagnosis and ongoing as needed.  Treatment plan  Your treatment plan is reviewed at every medical visit.  Document Released: 09/21/2009 Document Revised: 07/27/2013 Document Reviewed: 04/26/2013 [facility] Patient Information 2014 [place].

## 2015-08-14 ENCOUNTER — Other Ambulatory Visit: Payer: Medicare Other | Admitting: Family Medicine

## 2015-08-15 ENCOUNTER — Encounter: Payer: Self-pay | Admitting: Physical Therapy

## 2015-08-15 ENCOUNTER — Ambulatory Visit: Payer: Medicare Other | Attending: Orthopedic Surgery | Admitting: Physical Therapy

## 2015-08-15 DIAGNOSIS — M25561 Pain in right knee: Secondary | ICD-10-CM | POA: Insufficient documentation

## 2015-08-15 DIAGNOSIS — R531 Weakness: Secondary | ICD-10-CM | POA: Insufficient documentation

## 2015-08-15 DIAGNOSIS — M25661 Stiffness of right knee, not elsewhere classified: Secondary | ICD-10-CM | POA: Insufficient documentation

## 2015-08-15 NOTE — Therapy (Signed)
[facility] [place], [place], [address] Phone: [phone]   Fax:  [phone]  Physical Therapy Treatment  Patient Details  Name: Beth [name] MRN: [id number] Date of Birth: 09/27/1946 Referring Provider:  Claretta Fraise, MD  Encounter Date: 08/15/2015      PT End of Session - 08/15/15 1351    Visit Number 3   Number of Visits 12   Date for PT Re-Evaluation 09/17/15   PT Start Time 1314   PT Stop Time 1409   PT Time Calculation (min) 55 min   Activity Tolerance Patient tolerated treatment well   Behavior During Therapy [facility] for tasks assessed/performed      Past Medical History  Diagnosis Date  . Hypertension   . CVA (cerebral infarction)     2006  . Hyperlipidemia   . Blood transfusion without reported diagnosis 2012    anemia;pt denies transfusion stated was only on iron tablet  . Heart murmur   . Nocturia     3-4 times per night  . Gout     left elbow  . Arthritis     Knee both knees  . Anemia   . 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 08-09-14 Right eye  . Family history of anesthesia complication     sister very slow to awaken after anesthesia;severe vomiting   . Stroke 2006    x 1 no deficits noted   . Cataract     left    Past Surgical History  Procedure Laterality Date  . Carpal tunnel release Right 1983  . Abdominal hysterectomy  1983  . Colonscopy  June 21, 2014  . Nasal cauterization  2012  . Total knee arthroplasty Right 07/03/2014    Procedure: RIGHT TOTAL KNEE ARTHROPLASTY;  Surgeon: Gearlean Alf, MD;  Location: [facility];  Service: Orthopedics;  Laterality: Right;  . Patellar tendon repair Right 08/11/2014    Procedure: RIGHT PATELLA TENDON REPAIR;  Surgeon: Gearlean Alf, MD;  Location: [facility];  Service: Orthopedics;  Laterality: Right;  . Joint replacement  06/2014    right knee  . I&d knee with poly exchange Right 10/02/2014    Procedure: IRRIGATION AND DEBRIDEMENT RIGHT  KNEE WITH POLY EXCHANGE;  Surgeon: Gearlean Alf, MD;  Location: [facility];  Service: Orthopedics;  Laterality: Right;  . Excisional total knee arthroplasty with antibiotic spacers Right 02/16/2015    Procedure: RIGHT KNEE RESECTION ARTHROPLASTY WITH ANTIBIOTIC SPACERS;  Surgeon: [name], MD;  Location: [facility];  Service: Orthopedics;  Laterality: Right;  . Reimplantation of total knee Right 05/23/2015    Procedure: RIGHT KNEE ARTHROPLASTY REIMPLANTATION;  Surgeon: [name], MD;  Location: [facility];  Service: Orthopedics;  Laterality: Right;    There were no vitals filed for this visit.  Visit Diagnosis:  Right knee pain  Knee stiffness, right      Subjective Assessment - 08/15/15 1317    Subjective No new complaints.   Currently in Pain? No/denies            [facility] PT Assessment - 08/15/15 0001    ROM / Strength   AROM / PROM / Strength AROM;PROM   AROM   Overall AROM  Deficits   AROM Assessment Site Knee   Right/Left Knee Right   Right Knee Flexion 93   PROM   Overall PROM  Deficits   PROM Assessment Site Knee   Right/Left Knee Right   Right Knee Flexion  [facility] Adult PT Treatment/Exercise - 08/15/15 0001    Exercises   Exercises Knee/Hip   Knee/Hip Exercises: Aerobic   Nustep x42mn L4   Knee/Hip Exercises: Seated   Long Arc Quad Strengthening;Right;3 sets;10 reps   Long Arc Quad Weight 3 lbs.   Knee/Hip Exercises: Supine   Straight Leg Raises Strengthening;Right;2 sets;10 reps   Knee/Hip Exercises: Sidelying   Hip ABduction Strengthening;Right;2 sets;10 reps   Vasopneumatic   Number Minutes Vasopneumatic  15 minutes   Vasopnuematic Location  Knee   Vasopneumatic Pressure Medium   Manual Therapy   Manual Therapy Passive ROM   Passive ROM Gentle PROM for right knee flexion with low load holds                PT Education - 08/15/15 1322    Education provided Yes   Education Details HEP   Person(s) Educated  Patient   Methods Explanation;Demonstration;Handout   Comprehension Verbalized understanding;Returned demonstration          PT Short Term Goals - 08/15/15 1352    PT SHORT TERM GOAL #1   Title Ind with HEP.   Time 2   Period Weeks   Status Achieved           PT Long Term Goals - 08/15/15 1353    PT LONG TERM GOAL #1   Title Right hip and knee strength= 4/5 to increase stability for functional activites.   Time 4   Period Weeks   Status On-going   PT LONG TERM GOAL #2   Title Active right knee flexion= 115-120 degrees.   Time 4   Period Weeks   Status On-going   PT LONG TERM GOAL #3   Title Decrease edema to within 2.5 cms of contralateral side to assist with range of motion gains and decrease pain.   Time 4   Period Weeks   Status On-going               Plan - 08/15/15 1353    Clinical Impression Statement Patient progressing with all activities. patient has no pain pre/post treatment and tolerated treatment well. HEP given to patient for gentle strengthening and ROM for right knee flexion. Patient met STG #1 other LTG's ongoing due to ROM and strength deficits.   Pt will benefit from skilled therapeutic intervention in order to improve on the following deficits Pain;Decreased activity tolerance;Increased edema;Decreased strength;Decreased range of motion   Rehab Potential Good   PT Frequency 2x / week   PT Duration 4 weeks   PT Treatment/Interventions Vasopneumatic Device;Cryotherapy;Therapeutic activities;Therapeutic exercise;Neuromuscular re-education;Patient/family education;Manual techniques   PT Next Visit Plan Total knee protocol/ sent follow up to disscuss with MPT to add Electrical stimulation for VMS/quad strengthenming to POC?   Consulted and Agree with Plan of Care Patient        Problem List Patient Active Problem List   Diagnosis Date Noted  . Pre-diabetes 08/10/2015  . Metabolic syndrome 050/93/2671 . Malnutrition of moderate degree  05/25/2015  . Postoperative anemia due to acute blood loss 05/25/2015  . Septic arthritis of knee, right 02/16/2015  . Patellar tendon rupture 08/11/2014  . OA (osteoarthritis) of knee 07/03/2014  . HLD (hyperlipidemia) 01/04/2014  . HTN (hypertension) 01/04/2014  . Anemia, iron deficiency 12/30/2011  . Constipation 04/04/2009  . TUBULOVILLOUS ADENOMA, COLON 04/03/2009   CLadean Raya PTA 08/15/2015  2:35 PM   Soraya Paquette P, PTA 08/15/2015, 2:35 PM  [facility] [address] [place], [place], [address] Phone: [phone]   Fax:  [phone]

## 2015-08-15 NOTE — Patient Instructions (Signed)
  Knee Extension (Sitting)   Place __0-3__ pound weight on left ankle and straighten knee fully, lower slowly. Repeat _10___ times per set. Do __2-3__ sets per session. Do __2-3__ sessions per day.  Strengthening: Hip Abduction (Side-Lying)  Strengthening: Straight Leg Raise (Phase 1)  Repeat _10___ times per set. Do __2__ sets per session. Do __2__ sessions per day.    Straight Leg Raise   Tighten stomach and slowly raise locked right leg __4__ inches from floor. Repeat __10-30__ times per set. Do __2__ sets per session. Do __2__ sessions per day.  stretch 30 sec x5-10 2-3 x daily     Knee Flexion Stretch on Step  Place foot on step and lean forward until you feel a good stretch in front of knee.   hold 30 sec x 5-10 perform 2-4 x daily

## 2015-08-16 ENCOUNTER — Ambulatory Visit (INDEPENDENT_AMBULATORY_CARE_PROVIDER_SITE_OTHER): Payer: Medicare Other | Admitting: Family Medicine

## 2015-08-16 ENCOUNTER — Ambulatory Visit: Payer: Medicare Other

## 2015-08-16 ENCOUNTER — Other Ambulatory Visit: Payer: Self-pay | Admitting: Family Medicine

## 2015-08-16 ENCOUNTER — Encounter: Payer: Self-pay | Admitting: Family Medicine

## 2015-08-16 VITALS — BP 188/86 | HR 85 | Temp 96.9°F | Ht 65.0 in | Wt 196.0 lb

## 2015-08-16 DIAGNOSIS — Z1212 Encounter for screening for malignant neoplasm of rectum: Secondary | ICD-10-CM | POA: Diagnosis not present

## 2015-08-16 DIAGNOSIS — E785 Hyperlipidemia, unspecified: Secondary | ICD-10-CM | POA: Diagnosis not present

## 2015-08-16 DIAGNOSIS — Z139 Encounter for screening, unspecified: Secondary | ICD-10-CM | POA: Diagnosis not present

## 2015-08-16 DIAGNOSIS — D62 Acute posthemorrhagic anemia: Secondary | ICD-10-CM | POA: Diagnosis not present

## 2015-08-16 DIAGNOSIS — I10 Essential (primary) hypertension: Secondary | ICD-10-CM

## 2015-08-16 DIAGNOSIS — D509 Iron deficiency anemia, unspecified: Secondary | ICD-10-CM | POA: Diagnosis not present

## 2015-08-16 DIAGNOSIS — Z780 Asymptomatic menopausal state: Secondary | ICD-10-CM

## 2015-08-16 DIAGNOSIS — M1711 Unilateral primary osteoarthritis, right knee: Secondary | ICD-10-CM

## 2015-08-16 DIAGNOSIS — R7309 Other abnormal glucose: Secondary | ICD-10-CM

## 2015-08-16 DIAGNOSIS — R7303 Prediabetes: Secondary | ICD-10-CM

## 2015-08-16 MED ORDER — FEXOFENADINE HCL 180 MG PO TABS: 180.0000 mg | ORAL_TABLET | Freq: Every day | ORAL | Status: DC

## 2015-08-16 NOTE — Progress Notes (Signed)
Subjective:  Patient ID: Beth [name], female    DOB: 1946-05-07  Age: 69 y.o. MRN: [id number]  CC: Annual Exam   HPI Beth [name] presents for DM, DEXA,  Follow-up of diabetes. Patient does check blood sugar at home. They're usually in normal range. Patient denies symptoms such as polyuria, polydipsia, excessive hunger, nausea No significant hypoglycemic spells noted. Medications as noted below. Taking them regularly without complication/adverse reaction being reported today.   Patient in for follow-up of elevated cholesterol. Doing well without complaints on current medication. Denies side effects of statin including myalgia and arthralgia and nausea. Also in today for liver function testing. Currently no chest pain, shortness of breath or other cardiovascular related symptoms noted.   follow-up of hypertension. Patient has no history of headache chest pain or shortness of breath or recent cough. Patient also denies symptoms of TIA such as numbness weakness lateralizing. Patient checks  blood pressure at home and has not had any elevated readings recently. Patient denies side effects from his medication. States taking it regularly. Patient also has multiple joint pains and history of arthritis. She has morning stiffness that is relieved by ambulation and activity. Primarily affects the knees. She's had iron deficiency anemia and is due to have her level rechecked. She is no longer taking her iron supplement.  History Beth [name] has a past medical history of Hypertension; CVA (cerebral infarction); Hyperlipidemia; Blood transfusion without reported diagnosis (2012); Heart murmur; Nocturia; Gout; Arthritis; Anemia; Herpes infection (08-09-14); Family history of anesthesia complication; Stroke (3005); and Cataract.   She has past surgical history that includes Carpal tunnel release (Right, 1983); Abdominal hysterectomy (1983); colonscopy (June 21, 2014); nasal cauterization (2012); Total knee  arthroplasty (Right, 07/03/2014); Patellar tendon repair (Right, 08/11/2014); Joint replacement (06/2014); I&D knee with poly exchange (Right, 10/02/2014); Excisional total knee arthroplasty with antibiotic spacers (Right, 02/16/2015); and Reimplantation of total knee (Right, 05/23/2015).   Her family history includes Diabetes in her son and son; Heart disease in her brother; Hypertension in her father; Kidney disease in her daughter; Ovarian cancer in her mother; Peripheral vascular disease in her father. There is no history of Colon cancer, Esophageal cancer, Stomach cancer, or Rectal cancer.She reports that she has never smoked. She has never used smokeless tobacco. She reports that she does not drink alcohol or use illicit drugs.  Outpatient Prescriptions Prior to Visit  Medication Sig Dispense Refill  . amLODipine (NORVASC) 10 MG tablet Take 1 tablet (10 mg total) by mouth every evening. 90 tablet 4  . atorvastatin (LIPITOR) 20 MG tablet Take 1 tablet (20 mg total) by mouth every evening. 90 tablet 4  . blood glucose meter kit and supplies KIT Dispense based on patient and insurance preference. Use up to four times daily as directed. (FOR ICD-9 250.00, 250.01). 1 each 0  . metFORMIN (GLUCOPHAGE-XR) 500 MG 24 hr tablet Take 1 tablet (500 mg total) by mouth daily with breakfast. 30 tablet 2  . methocarbamol (ROBAXIN) 500 MG tablet Take 1 tablet (500 mg total) by mouth every 6 (six) hours as needed for muscle spasms. 40 tablet 1  . Nebivolol HCl (BYSTOLIC) 20 MG TABS Take 1 tablet (20 mg total) by mouth every evening. 90 tablet 1  . olmesartan-hydrochlorothiazide (BENICAR HCT) 40-12.5 MG per tablet Take 1 tablet by mouth every evening. 90 tablet 4  . ONE TOUCH ULTRA TEST test strip     . ONETOUCH DELICA LANCETS 11M MISC     . oxyCODONE (OXY IR/ROXICODONE)  5 MG immediate release tablet Take one tablet by mouth every 3 hours as needed for moderate pain and take two tablets by mouth every 3 hours as needed  for severe pain 360 tablet 0  . polyethylene glycol (MIRALAX / GLYCOLAX) packet Take 17 g by mouth 2 (two) times daily.    . ferrous fumarate (HEMOCYTE) 325 (106 FE) MG TABS tablet Take 1 tablet (106 mg of iron total) by mouth 2 (two) times daily. 60 each 2  . traMADol (ULTRAM) 50 MG tablet Take 1-2 tablets (50-100 mg total) by mouth every 6 (six) hours as needed for moderate pain. 60 tablet 0   Facility-Administered Medications Prior to Visit  Medication Dose Route Frequency Provider Last Rate Last Dose  . tranexamic acid (CYKLOKAPRON) 2,000 mg in sodium chloride 0.9 % 50 mL Topical Application  3,536 mg Topical Once [name], MD        ROS Review of Systems  Constitutional: Negative for fever, chills, diaphoresis, appetite change, fatigue and unexpected weight change.  HENT: Negative for congestion, ear pain, hearing loss, postnasal drip, rhinorrhea, sneezing, sore throat and trouble swallowing.   Eyes: Negative for pain.  Respiratory: Negative for cough, chest tightness and shortness of breath.   Cardiovascular: Negative for chest pain and palpitations.  Gastrointestinal: Negative for nausea, vomiting, abdominal pain, diarrhea and constipation.  Genitourinary: Negative for dysuria, frequency and menstrual problem.  Musculoskeletal: Negative for joint swelling and arthralgias.  Skin: Negative for rash.  Neurological: Negative for dizziness, weakness, numbness and headaches.  Psychiatric/Behavioral: Negative for dysphoric mood and agitation.    Objective:  BP 188/86 mmHg  Pulse 85  Temp(Src) 96.9 F (36.1 C) (Oral)  Ht _0  (1.651 m)  Wt 196 lb (88.905 kg)  BMI 32.62 kg/m2  BP Readings from Last 3 Encounters:  08/16/15 188/86  08/10/15 150/80  08/06/15 159/74    Wt Readings from Last 3 Encounters:  08/16/15 196 lb (88.905 kg)  08/10/15 200 lb 8 oz (90.946 kg)  08/06/15 201 lb (91.173 kg)     Physical Exam  Constitutional: She is oriented to person, place, and  time. She appears well-developed and well-nourished. No distress.  HENT:  Head: Normocephalic and atraumatic.  Right Ear: External ear normal.  Left Ear: External ear normal.  Nose: Nose normal.  Mouth/Throat: Oropharynx is clear and moist.  Eyes: Conjunctivae and EOM are normal. Pupils are equal, round, and reactive to light.  Neck: Normal range of motion. Neck supple. No thyromegaly present.  Cardiovascular: Normal rate, regular rhythm and normal heart sounds.   No murmur heard. Pulmonary/Chest: Effort normal and breath sounds normal. No respiratory distress. She has no wheezes. She has no rales.  Abdominal: Soft. Bowel sounds are normal. She exhibits no distension. There is no tenderness.  Lymphadenopathy:    She has no cervical adenopathy.  Neurological: She is alert and oriented to person, place, and time. She has normal reflexes.  Skin: Skin is warm and dry.  Psychiatric: She has a normal mood and affect. Her behavior is normal. Judgment and thought content normal.    Lab Results  Component Value Date   HGBA1C 6.3 08/10/2015   HGBA1C 6.1 06/21/2013      No results found.  Assessment & Plan:   Beth [name] was seen today for annual exam.  Diagnoses and all orders for this visit:  Screening -     MM Digital Screening; Future -     Cancel: DG Bone Density  Anemia, iron deficiency -  Anemia Profile B  Screening for malignant neoplasm of the rectum -     Fecal occult blood, imunochemical  Pre-diabetes  Primary osteoarthritis of right knee  Essential hypertension  HLD (hyperlipidemia)  Postoperative anemia due to acute blood loss  Other orders -     fexofenadine (ALLEGRA) 180 MG tablet; Take 1 tablet (180 mg total) by mouth daily.   I have discontinued Ms. Santini's traMADol. I am also having her start on fexofenadine. Additionally, I am having her maintain her amLODipine, atorvastatin, olmesartan-hydrochlorothiazide, methocarbamol, polyethylene glycol, oxyCODONE,  Nebivolol HCl, metFORMIN, blood glucose meter kit and supplies, ONE TOUCH ULTRA TEST, and ONETOUCH DELICA LANCETS 48A.  Meds ordered this encounter  Medications  . fexofenadine (ALLEGRA) 180 MG tablet    Sig: Take 1 tablet (180 mg total) by mouth daily.    Dispense:  90 tablet    Refill:  4     Follow-up: No Follow-up on file.  Claretta Fraise, M.D.

## 2015-08-17 ENCOUNTER — Other Ambulatory Visit: Payer: Self-pay

## 2015-08-17 ENCOUNTER — Telehealth: Payer: Self-pay | Admitting: Family Medicine

## 2015-08-17 LAB — ANEMIA PROFILE B
BASOS ABS: 0 10*3/uL (ref 0.0–0.2)
Basos: 1 %
EOS (ABSOLUTE): 0.1 10*3/uL (ref 0.0–0.4)
EOS: 2 %
FOLATE: 14 ng/mL (ref >3.0)
Ferritin: 115 ng/mL (ref 15–150)
HEMOGLOBIN: 11.4 g/dL (ref 11.1–15.9)
Hematocrit: 36.5 % (ref 34.0–46.6)
IMMATURE GRANS (ABS): 0 10*3/uL (ref 0.0–0.1)
IMMATURE GRANULOCYTES: 0 %
Iron Saturation: 20 % (ref 15–55)
Iron: 50 ug/dL (ref 27–139)
LYMPHS ABS: 1.6 10*3/uL (ref 0.7–3.1)
Lymphs: 22 %
MCH: 23.6 pg — AB (ref 26.6–33.0)
MCHC: 31.2 g/dL — ABNORMAL LOW (ref 31.5–35.7)
MCV: 76 fL — ABNORMAL LOW (ref 79–97)
MONOCYTES: 8 %
Monocytes Absolute: 0.6 10*3/uL (ref 0.1–0.9)
NEUTROS PCT: 67 %
Neutrophils Absolute: 5 10*3/uL (ref 1.4–7.0)
PLATELETS: 398 10*3/uL — AB (ref 150–379)
RBC: 4.83 x10E6/uL (ref 3.77–5.28)
RDW: 17.1 % — ABNORMAL HIGH (ref 12.3–15.4)
Retic Ct Pct: 0.7 % (ref 0.6–2.6)
TIBC: 247 ug/dL — AB (ref 250–450)
UIBC: 197 ug/dL (ref 118–369)
Vitamin B-12: 598 pg/mL (ref 211–946)
WBC: 7.4 10*3/uL (ref 3.4–10.8)

## 2015-08-17 MED ORDER — FERROUS FUMARATE 325 (106 FE) MG PO TABS: 1.0000 | ORAL_TABLET | Freq: Two times a day (BID) | ORAL | Status: DC

## 2015-08-19 LAB — FECAL OCCULT BLOOD, IMMUNOCHEMICAL: Fecal Occult Bld: NEGATIVE

## 2015-08-21 ENCOUNTER — Ambulatory Visit: Payer: Medicare Other | Admitting: *Deleted

## 2015-08-21 DIAGNOSIS — M25561 Pain in right knee: Secondary | ICD-10-CM

## 2015-08-21 DIAGNOSIS — M25661 Stiffness of right knee, not elsewhere classified: Secondary | ICD-10-CM | POA: Diagnosis not present

## 2015-08-21 DIAGNOSIS — R531 Weakness: Secondary | ICD-10-CM | POA: Diagnosis not present

## 2015-08-21 NOTE — Therapy (Signed)
[facility] [place], [place], [address] Phone: [phone]   Fax:  [phone]  Physical Therapy Treatment  Patient Details  Name: Beth [name] MRN: [id number] Date of Birth: Oct 28, 1946 Referring Provider:  Claretta Fraise, MD  Encounter Date: 08/21/2015      PT End of Session - 08/21/15 1400    Visit Number 4   Number of Visits 12   Date for PT Re-Evaluation 09/17/15   PT Start Time 2947   PT Stop Time 1438   PT Time Calculation (min) 53 min      Past Medical History  Diagnosis Date  . Hypertension   . CVA (cerebral infarction)     2006  . Hyperlipidemia   . Blood transfusion without reported diagnosis 2012    anemia;pt denies transfusion stated was only on iron tablet  . Heart murmur   . Nocturia     3-4 times per night  . Gout     left elbow  . Arthritis     Knee both knees  . Anemia   . 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 08-09-14 Right eye  . Family history of anesthesia complication     sister very slow to awaken after anesthesia;severe vomiting   . Stroke 2006    x 1 no deficits noted   . Cataract     left    Past Surgical History  Procedure Laterality Date  . Carpal tunnel release Right 1983  . Abdominal hysterectomy  1983  . Colonscopy  June 21, 2014  . Nasal cauterization  2012  . Total knee arthroplasty Right 07/03/2014    Procedure: RIGHT TOTAL KNEE ARTHROPLASTY;  Surgeon: Gearlean Alf, MD;  Location: [facility];  Service: Orthopedics;  Laterality: Right;  . Patellar tendon repair Right 08/11/2014    Procedure: RIGHT PATELLA TENDON REPAIR;  Surgeon: Gearlean Alf, MD;  Location: [facility];  Service: Orthopedics;  Laterality: Right;  . Joint replacement  06/2014    right knee  . I&d knee with poly exchange Right 10/02/2014    Procedure: IRRIGATION AND DEBRIDEMENT RIGHT KNEE WITH POLY EXCHANGE;  Surgeon: Gearlean Alf, MD;  Location: [facility];  Service: Orthopedics;  Laterality:  Right;  . Excisional total knee arthroplasty with antibiotic spacers Right 02/16/2015    Procedure: RIGHT KNEE RESECTION ARTHROPLASTY WITH ANTIBIOTIC SPACERS;  Surgeon: [name], MD;  Location: [facility];  Service: Orthopedics;  Laterality: Right;  . Reimplantation of total knee Right 05/23/2015    Procedure: RIGHT KNEE ARTHROPLASTY REIMPLANTATION;  Surgeon: [name], MD;  Location: [facility];  Service: Orthopedics;  Laterality: Right;    There were no vitals filed for this visit.  Visit Diagnosis:  Right knee pain  Knee stiffness, right                       [facility] Adult PT Treatment/Exercise - 08/21/15 0001    Exercises   Exercises (p) Knee/Hip   Knee/Hip Exercises: Aerobic   Nustep (p) x25min L4 seat 11 for ROM   Knee/Hip Exercises: Standing   Forward Step Up (p) Right;10 reps;Step Height: 4"   Knee/Hip Exercises: Seated   Long Arc Quad (p) Strengthening;Right;3 sets;10 reps   Long Arc Quad Weight (p) 2 lbs.  limited ROM due to TKE weakness   Vasopneumatic   Number Minutes Vasopneumatic  (p) 15 minutes   Vasopnuematic Location  (p) Knee   Vasopneumatic Pressure (p)  Medium   Manual Therapy   Manual Therapy (p) Passive ROM   Passive ROM (p) Gentle PROM for right knee flexion with low load holds                                                                                                     Step ups 0n 4 in step  2x10 with Bil. UE assistance in // bars             PT Short Term Goals - 08/15/15 1352    PT SHORT TERM GOAL #1   Title Ind with HEP.   Time 2   Period Weeks   Status Achieved           PT Long Term Goals - 08/15/15 1353    PT LONG TERM GOAL #1   Title Right hip and knee strength= 4/5 to increase stability for functional activites.   Time 4   Period Weeks   Status On-going   PT LONG TERM GOAL #2   Title Active right knee flexion= 115-120 degrees.   Time 4   Period Weeks   Status On-going   PT LONG TERM GOAL #3   Title  Decrease edema to within 2.5 cms of contralateral side to assist with range of motion gains and decrease pain.   Time 4   Period Weeks   Status On-going               Plan - 08/21/15 1524    Clinical Impression Statement Pt did fairly well today and feels that she is getting better Quad activation now from doing her home exs. She did fairly well with step ups today in  // bars, but needs Bil. UE assistance still. Flexion ROM progressing, but Extension is about the same.   Pt will benefit from skilled therapeutic intervention in order to improve on the following deficits Pain;Decreased activity tolerance;Increased edema;Decreased strength;Decreased range of motion   Rehab Potential Good   PT Frequency 2x / week   PT Duration 4 weeks   PT Treatment/Interventions Vasopneumatic Device;Cryotherapy;Therapeutic activities;Therapeutic exercise;Neuromuscular re-education;Patient/family education;Manual techniques;Electrical Stimulation   PT Next Visit Plan Total knee protocol/ sent follow up to disscuss with MPT to add Electrical stimulation for VMS/quad strengthenming to POC?   Consulted and Agree with Plan of Care Patient        Problem List Patient Active Problem List   Diagnosis Date Noted  . Pre-diabetes 08/10/2015  . Metabolic syndrome 47/65/4650  . Malnutrition of moderate degree 05/25/2015  . OA (osteoarthritis) of knee 07/03/2014  . HLD (hyperlipidemia) 01/04/2014  . HTN (hypertension) 01/04/2014  . Anemia, iron deficiency 12/30/2011  . Constipation 04/04/2009  . TUBULOVILLOUS ADENOMA, COLON 04/03/2009    Hermina Barnard,CHRIS, PTA 08/21/2015, 3:33 PM  [facility] [address] [place], [place], [address] Phone: [phone]   Fax:  [phone]

## 2015-08-23 ENCOUNTER — Encounter: Payer: Self-pay | Admitting: *Deleted

## 2015-08-23 ENCOUNTER — Other Ambulatory Visit: Payer: Medicare Other

## 2015-08-23 ENCOUNTER — Ambulatory Visit: Payer: Medicare Other | Admitting: *Deleted

## 2015-08-23 DIAGNOSIS — M25661 Stiffness of right knee, not elsewhere classified: Secondary | ICD-10-CM

## 2015-08-23 DIAGNOSIS — M25561 Pain in right knee: Secondary | ICD-10-CM | POA: Diagnosis not present

## 2015-08-23 DIAGNOSIS — Z1212 Encounter for screening for malignant neoplasm of rectum: Secondary | ICD-10-CM | POA: Diagnosis not present

## 2015-08-23 DIAGNOSIS — R531 Weakness: Secondary | ICD-10-CM | POA: Diagnosis not present

## 2015-08-23 NOTE — Therapy (Signed)
[facility] [place], [place], [address] Phone: [phone]   Fax:  [phone]  Physical Therapy Treatment  Patient Details  Name: Beth [name] MRN: [id number] Date of Birth: 1946-03-27 Referring Provider:  Claretta Fraise, MD  Encounter Date: 08/23/2015      PT End of Session - 08/23/15 1419    Visit Number 5   Number of Visits 12   Date for PT Re-Evaluation 09/17/15   PT Start Time 5498   PT Stop Time 1444   PT Time Calculation (min) 58 min      Past Medical History  Diagnosis Date  . Hypertension   . CVA (cerebral infarction)     2006  . Hyperlipidemia   . Blood transfusion without reported diagnosis 2012    anemia;pt denies transfusion stated was only on iron tablet  . Heart murmur   . Nocturia     3-4 times per night  . Gout     left elbow  . Arthritis     Knee both knees  . Anemia   . 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 08-09-14 Right eye  . Family history of anesthesia complication     sister very slow to awaken after anesthesia;severe vomiting   . Stroke 2006    x 1 no deficits noted   . Cataract     left    Past Surgical History  Procedure Laterality Date  . Carpal tunnel release Right 1983  . Abdominal hysterectomy  1983  . Colonscopy  June 21, 2014  . Nasal cauterization  2012  . Total knee arthroplasty Right 07/03/2014    Procedure: RIGHT TOTAL KNEE ARTHROPLASTY;  Surgeon: Gearlean Alf, MD;  Location: [facility];  Service: Orthopedics;  Laterality: Right;  . Patellar tendon repair Right 08/11/2014    Procedure: RIGHT PATELLA TENDON REPAIR;  Surgeon: Gearlean Alf, MD;  Location: [facility];  Service: Orthopedics;  Laterality: Right;  . Joint replacement  06/2014    right knee  . I&d knee with poly exchange Right 10/02/2014    Procedure: IRRIGATION AND DEBRIDEMENT RIGHT KNEE WITH POLY EXCHANGE;  Surgeon: Gearlean Alf, MD;  Location: [facility];  Service: Orthopedics;  Laterality:  Right;  . Excisional total knee arthroplasty with antibiotic spacers Right 02/16/2015    Procedure: RIGHT KNEE RESECTION ARTHROPLASTY WITH ANTIBIOTIC SPACERS;  Surgeon: [name], MD;  Location: [facility];  Service: Orthopedics;  Laterality: Right;  . Reimplantation of total knee Right 05/23/2015    Procedure: RIGHT KNEE ARTHROPLASTY REIMPLANTATION;  Surgeon: [name], MD;  Location: [facility];  Service: Orthopedics;  Laterality: Right;    There were no vitals filed for this visit.  Visit Diagnosis:  Right knee pain  Knee stiffness, right      Subjective Assessment - 08/23/15 1420    Subjective Did ok after the last Rx. No pain or meds taken today   Currently in Pain? No/denies                         [facility] Adult PT Treatment/Exercise - 08/23/15 0001    Exercises   Exercises Knee/Hip   Knee/Hip Exercises: Aerobic   Nustep x18 min L4 seat 11 for ROM   Knee/Hip Exercises: Standing   Forward Step Up Right;Step Height: 4";10 reps;3 sets   Rocker Board 3 minutes  Calf stretching   Modalities   Modalities Vasopneumatic   Vasopneumatic  Number Minutes Vasopneumatic  15 minutes   Vasopnuematic Location  Knee   Vasopneumatic Pressure Medium   Manual Therapy   Manual Therapy Passive ROM   Passive ROM Gentle PROM for right knee flexion and extension with low load holds. AAROM for SLR on RT 3x10 due to extensor Lag                  PT Short Term Goals - 08/15/15 1352    PT SHORT TERM GOAL #1   Title Ind with HEP.   Time 2   Period Weeks   Status Achieved           PT Long Term Goals - 08/15/15 1353    PT LONG TERM GOAL #1   Title Right hip and knee strength= 4/5 to increase stability for functional activites.   Time 4   Period Weeks   Status On-going   PT LONG TERM GOAL #2   Title Active right knee flexion= 115-120 degrees.   Time 4   Period Weeks   Status On-going   PT LONG TERM GOAL #3   Title Decrease edema to within 2.5 cms of  contralateral side to assist with range of motion gains and decrease pain.   Time 4   Period Weeks   Status On-going               Plan - 08/23/15 1419    Clinical Impression Statement Pt did fair with todays Rx, but continues to have an extensor Lag with SLR and needs assistance. She had 105 degrees of flexion today. Goals are ongoing   Pt will benefit from skilled therapeutic intervention in order to improve on the following deficits Pain;Decreased activity tolerance;Increased edema;Decreased strength;Decreased range of motion   Rehab Potential Good   PT Frequency 2x / week   PT Duration 4 weeks   PT Treatment/Interventions Vasopneumatic Device;Cryotherapy;Therapeutic activities;Therapeutic exercise;Neuromuscular re-education;Patient/family education;Manual techniques;Electrical Stimulation   PT Next Visit Plan Total knee protocol.  Electrical stimulation for VMS/quad strengthenming to POC   Consulted and Agree with Plan of Care Patient        Problem List Patient Active Problem List   Diagnosis Date Noted  . Pre-diabetes 08/10/2015  . Metabolic syndrome 37/03/8888  . Malnutrition of moderate degree 05/25/2015  . OA (osteoarthritis) of knee 07/03/2014  . HLD (hyperlipidemia) 01/04/2014  . HTN (hypertension) 01/04/2014  . Anemia, iron deficiency 12/30/2011  . Constipation 04/04/2009  . TUBULOVILLOUS ADENOMA, COLON 04/03/2009    Sharie Amorin,CHRIS, PTA 08/23/2015, 5:33 PM  [facility] [address] [place], [place], [address] Phone: [phone]   Fax:  [phone]

## 2015-08-23 NOTE — Progress Notes (Signed)
Lab only 

## 2015-08-24 ENCOUNTER — Telehealth: Payer: Self-pay | Admitting: *Deleted

## 2015-08-24 MED ORDER — ATORVASTATIN CALCIUM 20 MG PO TABS: 20.0000 mg | ORAL_TABLET | Freq: Every evening | ORAL | Status: DC

## 2015-08-24 MED ORDER — AMLODIPINE BESYLATE 10 MG PO TABS: 10.0000 mg | ORAL_TABLET | Freq: Every evening | ORAL | Status: DC

## 2015-08-24 NOTE — Telephone Encounter (Signed)
done

## 2015-08-26 LAB — FECAL OCCULT BLOOD, IMMUNOCHEMICAL: Fecal Occult Bld: NEGATIVE

## 2015-08-28 ENCOUNTER — Ambulatory Visit: Payer: Medicare Other | Admitting: Physical Therapy

## 2015-08-28 ENCOUNTER — Encounter: Payer: Self-pay | Admitting: Physical Therapy

## 2015-08-28 DIAGNOSIS — R531 Weakness: Secondary | ICD-10-CM | POA: Diagnosis not present

## 2015-08-28 DIAGNOSIS — M25561 Pain in right knee: Secondary | ICD-10-CM | POA: Diagnosis not present

## 2015-08-28 DIAGNOSIS — M25661 Stiffness of right knee, not elsewhere classified: Secondary | ICD-10-CM

## 2015-08-28 NOTE — Therapy (Signed)
[facility] [place], [place], [address] Phone: [phone]   Fax:  [phone]  Physical Therapy Treatment  Patient Details  Name: Beth [name] MRN: [id number] Date of Birth: [birth year] Referring Provider:  Claretta Fraise, MD  Encounter Date: 08/28/2015      PT End of Session - 08/28/15 1522    Visit Number 6   Number of Visits 12   Date for PT Re-Evaluation 09/17/15   PT Start Time 1519   PT Stop Time 1615   PT Time Calculation (min) 56 min   Activity Tolerance Patient tolerated treatment well   Behavior During Therapy [facility] for tasks assessed/performed      Past Medical History  Diagnosis Date  . Hypertension   . CVA (cerebral infarction)     2006  . Hyperlipidemia   . Blood transfusion without reported diagnosis 2012    anemia;pt denies transfusion stated was only on iron tablet  . Heart murmur   . Nocturia     3-4 times per night  . Gout     left elbow  . Arthritis     Knee both knees  . Anemia   . 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 08-09-14 Right eye  . Family history of anesthesia complication     sister very slow to awaken after anesthesia;severe vomiting   . Stroke 2006    x 1 no deficits noted   . Cataract     left    Past Surgical History  Procedure Laterality Date  . Carpal tunnel release Right 1983  . Abdominal hysterectomy  1983  . Colonscopy  June 21, 2014  . Nasal cauterization  2012  . Total knee arthroplasty Right 07/03/2014    Procedure: RIGHT TOTAL KNEE ARTHROPLASTY;  Surgeon: Gearlean Alf, MD;  [facility]: [facility];  Service: Orthopedics;  Laterality: Right;  . Patellar tendon repair Right 08/11/2014    Procedure: RIGHT PATELLA TENDON REPAIR;  Surgeon: Gearlean Alf, MD;  Location: [facility];  Service: Orthopedics;  Laterality: Right;  . Joint replacement  06/2014    right knee  . I&d knee with poly exchange Right 10/02/2014    Procedure: IRRIGATION AND DEBRIDEMENT RIGHT  KNEE WITH POLY EXCHANGE;  Surgeon: Gearlean Alf, MD;  [facility]: [facility];  Service: Orthopedics;  Laterality: Right;  . Excisional total knee arthroplasty with antibiotic spacers Right 02/16/2015    Procedure: RIGHT KNEE RESECTION ARTHROPLASTY WITH ANTIBIOTIC SPACERS;  Surgeon: [name], MD;  [facility]: [facility];  Service: Orthopedics;  Laterality: Right;  . Reimplantation of total knee Right 05/23/2015    Procedure: RIGHT KNEE ARTHROPLASTY REIMPLANTATION;  Surgeon: [name], MD;  [facility]: [facility];  Service: Orthopedics;  Laterality: Right;    There were no vitals filed for this visit.  Visit Diagnosis:  Right knee pain  Knee stiffness, right      Subjective Assessment - 08/28/15 1521    Subjective Reports that sometimes she has a pin like sensation on the R side of her knee but no other complaints. Reports RLE feeling weak today and "I know it will be raining."   Currently in Pain? No/denies            [facility] PT Assessment - 08/28/15 0001    Assessment   Medical Diagnosis Right total knee arthroplasty revision.   Onset Date/Surgical Date 05/24/15  [facility] Adult PT Treatment/Exercise - 08/28/15 0001    Knee/Hip Exercises: Aerobic   Nustep L5, seat 11 x12 min   Knee/Hip Exercises: Standing   Forward Step Up Right;Step Height: 4";2 sets;10 reps;Hand Hold: 2  Dificulty due to RLE weakness   Rocker Board 3 minutes  Emphasis on calf stretch   Knee/Hip Exercises: Seated   Long Arc Quad Strengthening;Right;1 set;15 reps;AAROM;Weights  Mod A +1 with 3 sec hold at end range   Long Arc Quad Weight 3 lbs.   Modalities   Modalities Vasopneumatic   Vasopneumatic   Number Minutes Vasopneumatic  15 minutes   Vasopnuematic Location  Knee   Vasopneumatic Pressure Medium   Vasopneumatic Temperature  34   Manual Therapy   Manual Therapy Passive ROM   Passive ROM Gentle PROM of R knee into flex/ext with gentle holds at end range                   PT Short Term Goals - 08/15/15 1352    PT SHORT TERM GOAL #1   Title Ind with HEP.   Time 2   Period Weeks   Status Achieved           PT Long Term Goals - 08/15/15 1353    PT LONG TERM GOAL #1   Title Right hip and knee strength= 4/5 to increase stability for functional activites.   Time 4   Period Weeks   Status On-going   PT LONG TERM GOAL #2   Title Active right knee flexion= 115-120 degrees.   Time 4   Period Weeks   Status On-going   PT LONG TERM GOAL #3   Title Decrease edema to within 2.5 cms of contralateral side to assist with range of motion gains and decrease pain.   Time 4   Period Weeks   Status On-going               Plan - 08/28/15 1653    Clinical Impression Statement Patient tolerated treatment today fairly well although she experienced RLE weakness during step ups as well as LAQ. Patient was mod A +1 to hold LAQ at end range today in sitting. Firm end feels noted during PROM of the R knee especially into extension. LT goals remain on-going secondary to decreased strength, ROM and increased edema. Normal vasopneumatic response noted following removal of the modalities.   Pt will benefit from skilled therapeutic intervention in order to improve on the following deficits Pain;Decreased activity tolerance;Increased edema;Decreased strength;Decreased range of motion   Rehab Potential Good   PT Frequency 2x / week   PT Duration 4 weeks   PT Treatment/Interventions Vasopneumatic Device;Cryotherapy;Therapeutic activities;Therapeutic exercise;Neuromuscular re-education;Patient/family education;Manual techniques;Electrical Stimulation   PT Next Visit Plan Total knee protocol.  Electrical stimulation for VMS/quad strengthenming to POC   Consulted and Agree with Plan of Care Patient        Problem List Patient Active Problem List   Diagnosis Date Noted  . Pre-diabetes 08/10/2015  . Metabolic syndrome 25/95/6387  .  Malnutrition of moderate degree 05/25/2015  . OA (osteoarthritis) of knee 07/03/2014  . HLD (hyperlipidemia) 01/04/2014  . HTN (hypertension) 01/04/2014  . Anemia, iron deficiency 12/30/2011  . Constipation 04/04/2009  . TUBULOVILLOUS ADENOMA, COLON 04/03/2009    Wynelle Fanny, PTA 08/28/2015, 5:12 PM  [facility] [address] [place], [place], [address] Phone: [phone]   Fax:  [phone]

## 2015-08-30 ENCOUNTER — Encounter: Payer: Medicare Other | Admitting: Physical Therapy

## 2015-09-05 ENCOUNTER — Ambulatory Visit: Payer: Medicare Other | Admitting: Physical Therapy

## 2015-09-05 ENCOUNTER — Encounter: Payer: Self-pay | Admitting: Physical Therapy

## 2015-09-05 DIAGNOSIS — M25561 Pain in right knee: Secondary | ICD-10-CM

## 2015-09-05 DIAGNOSIS — M25661 Stiffness of right knee, not elsewhere classified: Secondary | ICD-10-CM

## 2015-09-05 DIAGNOSIS — R531 Weakness: Secondary | ICD-10-CM | POA: Diagnosis not present

## 2015-09-05 NOTE — Therapy (Signed)
[facility] [place], [place], [address] Phone: [phone]   Fax:  [phone]  Physical Therapy Treatment  Patient Details  Name: Beth [name] MRN: [id number] Date of Birth: 1946-07-17 Referring Provider:  Claretta Fraise, MD  Encounter Date: 09/05/2015      PT End of Session - 09/05/15 1436    Visit Number 7   Number of Visits 12   Date for PT Re-Evaluation 09/17/15   PT Start Time 1062   PT Stop Time 1529   PT Time Calculation (min) 58 min   Activity Tolerance Patient tolerated treatment well   Behavior During Therapy [facility] for tasks assessed/performed      Past Medical History  Diagnosis Date  . Hypertension   . CVA (cerebral infarction)     2006  . Hyperlipidemia   . Blood transfusion without reported diagnosis 2012    anemia;pt denies transfusion stated was only on iron tablet  . Heart murmur   . Nocturia     3-4 times per night  . Gout     left elbow  . Arthritis     Knee both knees  . Anemia   . 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 08-09-14 Right eye  . Family history of anesthesia complication     sister very slow to awaken after anesthesia;severe vomiting   . Stroke 2006    x 1 no deficits noted   . Cataract     left    Past Surgical History  Procedure Laterality Date  . Carpal tunnel release Right 1983  . Abdominal hysterectomy  1983  . Colonscopy  June 21, 2014  . Nasal cauterization  2012  . Total knee arthroplasty Right 07/03/2014    Procedure: RIGHT TOTAL KNEE ARTHROPLASTY;  Surgeon: Gearlean Alf, MD;  Location: [facility];  Service: Orthopedics;  Laterality: Right;  . Patellar tendon repair Right 08/11/2014    Procedure: RIGHT PATELLA TENDON REPAIR;  Surgeon: Gearlean Alf, MD;  Location: [facility];  Service: Orthopedics;  Laterality: Right;  . Joint replacement  06/2014    right knee  . I&d knee with poly exchange Right 10/02/2014    Procedure: IRRIGATION AND DEBRIDEMENT RIGHT  KNEE WITH POLY EXCHANGE;  Surgeon: Gearlean Alf, MD;  Location: [facility];  Service: Orthopedics;  Laterality: Right;  . Excisional total knee arthroplasty with antibiotic spacers Right 02/16/2015    Procedure: RIGHT KNEE RESECTION ARTHROPLASTY WITH ANTIBIOTIC SPACERS;  Surgeon: [name], MD;  Location: [facility];  Service: Orthopedics;  Laterality: Right;  . Reimplantation of total knee Right 05/23/2015    Procedure: RIGHT KNEE ARTHROPLASTY REIMPLANTATION;  Surgeon: [name], MD;  [facility];  Service: Orthopedics;  Laterality: Right;    There were no vitals filed for this visit.  Visit Diagnosis:  Right knee pain  Knee stiffness, right      Subjective Assessment - 09/05/15 1436    Subjective Reports she is now staying at home by herself and completing all household activities herself except for cleaning and has outside help come and clean her home for her. Continues to report L knee pain as well.   Currently in Pain? No/denies            [facility] PT Assessment - 09/05/15 0001    Assessment   Medical Diagnosis Right total knee arthroplasty revision.   Onset Date/Surgical Date 05/24/15  [facility] Adult PT Treatment/Exercise - 09/05/15 0001    Ambulation/Gait   Ambulation/Gait Yes   Ambulation/Gait Assistance Other (comment)  CGA   Ambulation Distance (Feet) 60 Feet   Assistive device Small based quad cane   Gait Pattern Step-to pattern;Decreased step length - right;Decreased step length - left;Decreased stance time - right;Decreased stride length;Decreased hip/knee flexion - right;Decreased hip/knee flexion - left;Decreased dorsiflexion - right;Decreased weight shift to right;Right flexed knee in stance;Left flexed knee in stance;Antalgic;Narrow base of support;Trunk flexed;Poor foot clearance - left;Poor foot clearance - right   Ambulation Surface Level;Indoor   Knee/Hip Exercises: Aerobic   Nustep L3, L3 x13 min   Knee/Hip Exercises:  Standing   Forward Step Up Right;2 sets;10 reps;Hand Hold: 2;Step Height: 6"   Rocker Board 3 minutes   Knee/Hip Exercises: Seated   Long Arc Quad Strengthening;Right;2 sets;10 reps;AAROM;Weights  Min A +1 with 3 sec hold at end range   Long Arc Quad Weight 3 lbs.   Sit to Sand 1 set;15 reps;with UE support  Focus on using LEs mostly   Modalities   Modalities Vasopneumatic   Vasopneumatic   Number Minutes Vasopneumatic  15 minutes   Vasopnuematic Location  Knee   Vasopneumatic Pressure Medium   Vasopneumatic Temperature  34                  PT Short Term Goals - 08/15/15 1352    PT SHORT TERM GOAL #1   Title Ind with HEP.   Time 2   Period Weeks   Status Achieved           PT Long Term Goals - 08/15/15 1353    PT LONG TERM GOAL #1   Title Right hip and knee strength= 4/5 to increase stability for functional activites.   Time 4   Period Weeks   Status On-going   PT LONG TERM GOAL #2   Title Active right knee flexion= 115-120 degrees.   Time 4   Period Weeks   Status On-going   PT LONG TERM GOAL #3   Title Decrease edema to within 2.5 cms of contralateral side to assist with range of motion gains and decrease pain.   Time 4   Period Weeks   Status On-going               Plan - 09/05/15 1517    Clinical Impression Statement Patient is progressing slowly in PT in regards to strength of RLE. Tolerated RLE forward step ups on 6" step fairly well today and required decreased assist (Mod A to Min A) with hold at end range with LAQ. Gait training with [facility] was attempted today per patient report but patient stated she wasn't ready for QC yet. Patient ambulated with B knee flexion in stance secondary to pain in L knee as well. LT goals remain on-going secondary to decreased strength, ROM, and increased edema. Normal vasopneumatic response noted following removal of the vasopneumatic system.. Denied pain following today's treatment.   Pt will benefit from  skilled therapeutic intervention in order to improve on the following deficits Pain;Decreased activity tolerance;Increased edema;Decreased strength;Decreased range of motion   Rehab Potential Good   PT Frequency 2x / week   PT Duration 4 weeks   PT Treatment/Interventions Vasopneumatic Device;Cryotherapy;Therapeutic activities;Therapeutic exercise;Neuromuscular re-education;Patient/family education;Manual techniques;Electrical Stimulation   PT Next Visit Plan Total knee protocol.  Electrical stimulation for VMS/quad strengthenming to POC   Consulted and Agree with Plan of Care Patient  Problem List Patient Active Problem List   Diagnosis Date Noted  . Pre-diabetes 08/10/2015  . Metabolic syndrome 96/22/2979  . Malnutrition of moderate degree 05/25/2015  . OA (osteoarthritis) of knee 07/03/2014  . HLD (hyperlipidemia) 01/04/2014  . HTN (hypertension) 01/04/2014  . Anemia, iron deficiency 12/30/2011  . Constipation 04/04/2009  . TUBULOVILLOUS ADENOMA, COLON 04/03/2009    Wynelle Fanny, PTA 09/05/2015, 3:35 PM  [facility] [address] [place], [place], [address] Phone: [phone]   Fax:  [phone]

## 2015-09-06 ENCOUNTER — Ambulatory Visit: Payer: Medicare Other | Admitting: Physical Therapy

## 2015-09-06 DIAGNOSIS — M25661 Stiffness of right knee, not elsewhere classified: Secondary | ICD-10-CM

## 2015-09-06 DIAGNOSIS — M25561 Pain in right knee: Secondary | ICD-10-CM | POA: Diagnosis not present

## 2015-09-06 DIAGNOSIS — R531 Weakness: Secondary | ICD-10-CM | POA: Diagnosis not present

## 2015-09-06 NOTE — Therapy (Signed)
[facility] [place], [place], [address] Phone: [phone]   Fax:  [phone]  Physical Therapy Treatment  Patient Details  Name: Beth [name] MRN: [id number] Date of Birth: 08/04/1946 Referring Provider:  Claretta Fraise, MD  Encounter Date: 09/06/2015      PT End of Session - 09/06/15 1032    Visit Number 8   Number of Visits 12   Date for PT Re-Evaluation 09/17/15   PT Start Time 1031   PT Stop Time 1118   PT Time Calculation (min) 47 min   Activity Tolerance Patient tolerated treatment well   Behavior During Therapy [facility] for tasks assessed/performed      Past Medical History  Diagnosis Date  . Hypertension   . CVA (cerebral infarction)     2006  . Hyperlipidemia   . Blood transfusion without reported diagnosis 2012    anemia;pt denies transfusion stated was only on iron tablet  . Heart murmur   . Nocturia     3-4 times per night  . Gout     left elbow  . Arthritis     Knee both knees  . Anemia   . 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 08-09-14 Right eye  . Family history of anesthesia complication     sister very slow to awaken after anesthesia;severe vomiting   . Stroke 2006    x 1 no deficits noted   . Cataract     left    Past Surgical History  Procedure Laterality Date  . Carpal tunnel release Right 1983  . Abdominal hysterectomy  1983  . Colonscopy  June 21, 2014  . Nasal cauterization  2012  . Total knee arthroplasty Right 07/03/2014    Procedure: RIGHT TOTAL KNEE ARTHROPLASTY;  Surgeon: Gearlean Alf, MD;  Location: [facility];  Service: Orthopedics;  Laterality: Right;  . Patellar tendon repair Right 08/11/2014    Procedure: RIGHT PATELLA TENDON REPAIR;  Surgeon: Gearlean Alf, MD;  Location: [facility];  Service: Orthopedics;  Laterality: Right;  . Joint replacement  06/2014    right knee  . I&d knee with poly exchange Right 10/02/2014    Procedure: IRRIGATION AND DEBRIDEMENT RIGHT  KNEE WITH POLY EXCHANGE;  Surgeon: Gearlean Alf, MD;  Location: [facility];  Service: Orthopedics;  Laterality: Right;  . Excisional total knee arthroplasty with antibiotic spacers Right 02/16/2015    Procedure: RIGHT KNEE RESECTION ARTHROPLASTY WITH ANTIBIOTIC SPACERS;  Surgeon: [name], MD;  [facility]: [facility];  Service: Orthopedics;  Laterality: Right;  . Reimplantation of total knee Right 05/23/2015    Procedure: RIGHT KNEE ARTHROPLASTY REIMPLANTATION;  Surgeon: [name], MD;  [facility];  Service: Orthopedics;  Laterality: Right;    There were no vitals filed for this visit.  Visit Diagnosis:  Generalized weakness - Plan: PT plan of care cert/re-cert  Knee stiffness, right - Plan: PT plan of care cert/re-cert      Subjective Assessment - 09/06/15 1032    Subjective Patient states she is doing pretty well today.   Currently in Pain? No/denies                         [facility] Adult PT Treatment/Exercise - 09/06/15 0001    Knee/Hip Exercises: Aerobic   Nustep L3, L5 x13 min   Knee/Hip Exercises: Standing   Forward Step Up Right;Hand Hold: 2;Step Height: 6";1 set;Step Height: 2";Step Height: 4";5  reps   Forward Step Up Limitations patient unable to step up without mod A x 1 even on 2 inch step   Knee/Hip Exercises: Supine   Quad Sets 2 sets;10 reps   Short Arc Quad Sets 1 set   Short Arc Quad Sets Limitations patient unable to perform at various support heights   Straight Leg Raises Limitations unable to perform without quad lag   Modalities   Modalities Retail buyer Location R quad   Electrical Stimulation Action VMS   Electrical Stimulation Parameters 10/50 on off  x 20 min  unable to get full contraction with stim, two diff sites   Electrical Stimulation Goals Strength                PT Education - 09/06/15 1425    Education provided Yes   Education Details modified HEP: hold  QS for 10 sec; after active HS curls with w/c, hold ext stretch x 5 min   Person(s) Educated Patient   Methods Explanation;Demonstration;Tactile cues;Verbal cues   Comprehension Verbalized understanding;Returned demonstration          PT Short Term Goals - 08/15/15 1352    PT SHORT TERM GOAL #1   Title Ind with HEP.   Time 2   Period Weeks   Status Achieved           PT Long Term Goals - 08/15/15 1353    PT LONG TERM GOAL #1   Title Right hip and knee strength= 4/5 to increase stability for functional activites.   Time 4   Period Weeks   Status On-going   PT LONG TERM GOAL #2   Title Active right knee flexion= 115-120 degrees.   Time 4   Period Weeks   Status On-going   PT LONG TERM GOAL #3   Title Decrease edema to within 2.5 cms of contralateral side to assist with range of motion gains and decrease pain.   Time 4   Period Weeks   Status On-going               Plan - 09/06/15 1427    Clinical Impression Statement Patient is unable to complete a SAQ and we had difficulty getting a contraction with VMS assist. PT would like to attempt again with double channel vs single today. She is unable to perform SLR without quad lag, but demos a strong contraction with QS. She required mod A x 1 with step ups.   Pt will benefit from skilled therapeutic intervention in order to improve on the following deficits Pain;Decreased activity tolerance;Increased edema;Decreased strength;Decreased range of motion   Rehab Potential Good   PT Frequency 2x / week   PT Duration 6 weeks  original cert period was for 6 wks (09/17/15)   PT Treatment/Interventions Vasopneumatic Device;Cryotherapy;Therapeutic activities;Therapeutic exercise;Neuromuscular re-education;Patient/family education;Manual techniques;Electrical Stimulation   PT Next Visit Plan  Electrical stimulation for VMS/quad strengthening (use two channels) for SAQ (recert sent to Dr. Wynelle Link)   Consulted and Agree with Plan  of Care Patient        Problem List Patient Active Problem List   Diagnosis Date Noted  . Pre-diabetes 08/10/2015  . Metabolic syndrome 66/44/0347  . Malnutrition of moderate degree 05/25/2015  . OA (osteoarthritis) of knee 07/03/2014  . HLD (hyperlipidemia) 01/04/2014  . HTN (hypertension) 01/04/2014  . Anemia, iron deficiency 12/30/2011  . Constipation 04/04/2009  . TUBULOVILLOUS ADENOMA, COLON 04/03/2009    Madelyn Flavors  PT  09/06/2015, 2:42 PM  [facility] [place], [place], [address] Phone: [phone]   Fax:  [phone]

## 2015-09-10 ENCOUNTER — Ambulatory Visit: Payer: Medicare Other | Attending: Orthopedic Surgery | Admitting: Physical Therapy

## 2015-09-10 DIAGNOSIS — M25561 Pain in right knee: Secondary | ICD-10-CM | POA: Diagnosis not present

## 2015-09-10 DIAGNOSIS — R531 Weakness: Secondary | ICD-10-CM | POA: Diagnosis not present

## 2015-09-10 DIAGNOSIS — M25661 Stiffness of right knee, not elsewhere classified: Secondary | ICD-10-CM | POA: Insufficient documentation

## 2015-09-10 NOTE — Therapy (Signed)
[facility] [place], [place], [address] Phone: [phone]   Fax:  [phone]  Physical Therapy Treatment  Patient Details  Name: Beth [name] MRN: [id number] Date of Birth: 18-Apr-1946 Referring Provider:  Claretta Fraise, MD  Encounter Date: 09/10/2015      PT End of Session - 09/10/15 1438    Visit Number 9   Number of Visits 12   Date for PT Re-Evaluation 09/17/15   PT Start Time 1761   PT Stop Time 1521   PT Time Calculation (min) 50 min   Activity Tolerance Patient tolerated treatment well   Behavior During Therapy [facility] for tasks assessed/performed      Past Medical History  Diagnosis Date  . Hypertension   . CVA (cerebral infarction)     2006  . Hyperlipidemia   . Blood transfusion without reported diagnosis 2012    anemia;pt denies transfusion stated was only on iron tablet  . Heart murmur   . Nocturia     3-4 times per night  . Gout     left elbow  . Arthritis     Knee both knees  . Anemia   . 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 08-09-14 Right eye  . Family history of anesthesia complication     sister very slow to awaken after anesthesia;severe vomiting   . Stroke 2006    x 1 no deficits noted   . Cataract     left    Past Surgical History  Procedure Laterality Date  . Carpal tunnel release Right 1983  . Abdominal hysterectomy  1983  . Colonscopy  June 21, 2014  . Nasal cauterization  2012  . Total knee arthroplasty Right 07/03/2014    Procedure: RIGHT TOTAL KNEE ARTHROPLASTY;  Surgeon: Gearlean Alf, MD;  [facility];  Service: Orthopedics;  Laterality: Right;  . Patellar tendon repair Right 08/11/2014    Procedure: RIGHT PATELLA TENDON REPAIR;  Surgeon: Gearlean Alf, MD;  [facility];  Service: Orthopedics;  Laterality: Right;  . Joint replacement  06/2014    right knee  . I&d knee with poly exchange Right 10/02/2014    Procedure: IRRIGATION AND DEBRIDEMENT RIGHT  KNEE WITH POLY EXCHANGE;  Surgeon: Gearlean Alf, MD;  [facility];  Service: Orthopedics;  Laterality: Right;  . Excisional total knee arthroplasty with antibiotic spacers Right 02/16/2015    Procedure: RIGHT KNEE RESECTION ARTHROPLASTY WITH ANTIBIOTIC SPACERS;  Surgeon: [name], MD;  [facility];  Service: Orthopedics;  Laterality: Right;  . Reimplantation of total knee Right 05/23/2015    Procedure: RIGHT KNEE ARTHROPLASTY REIMPLANTATION;  Surgeon: [name], MD;  [facility];  Service: Orthopedics;  Laterality: Right;    There were no vitals filed for this visit.  Visit Diagnosis:  Generalized weakness      Subjective Assessment - 09/10/15 1439    Subjective It is not bothering me at all today.   Currently in Pain? No/denies                         [facility] Adult PT Treatment/Exercise - 09/10/15 0001    Knee/Hip Exercises: Aerobic   Nustep L5 x10 min   Knee/Hip Exercises: Standing   Heel Raises Both;2 sets;10 reps  Second set with RLE on 2 inch step   Knee Flexion Strengthening;Right;2 sets;10 reps   Knee/Hip Exercises: Seated   Long Arc Quad Strengthening;Right;2  sets;10 reps   Long Arc Quad Weight 2 lbs.   Long [employer] Limitations 3# caused patient to lean backward at waist to assist   Acupuncturist Location R quad  4 electrodes VMO/VLO QS   Electrical Stimulation Action VMS  unable to do SAQ so went to QS   Electrical Stimulation Parameters 10/10 116 pps 220 usec   Electrical Stimulation Goals Strength                  PT Short Term Goals - 08/15/15 1352    PT SHORT TERM GOAL #1   Title Ind with HEP.   Time 2   Period Weeks   Status Achieved           PT Long Term Goals - 08/15/15 1353    PT LONG TERM GOAL #1   Title Right hip and knee strength= 4/5 to increase stability for functional activites.   Time 4   Period Weeks   Status On-going   PT LONG TERM GOAL #2   Title  Active right knee flexion= 115-120 degrees.   Time 4   Period Weeks   Status On-going   PT LONG TERM GOAL #3   Title Decrease edema to within 2.5 cms of contralateral side to assist with range of motion gains and decrease pain.   Time 4   Period Weeks   Status On-going               Plan - 09/10/15 1513    Clinical Impression Statement Patient was still unable to perform SAQ with VMS assist so went to QS. She tolerated therex well. She compensates with LLE, so therex should be modified to account for this.   PT Next Visit Plan FOTO;  Electrical stimulation for VMS/quad strengthening (use two channels large electrodes) for SAQ. Place one channel on RF one on VMO.  Continue HS strengthening, add bridge. modify TE so patient cant cheat with LLE. Recert sent to Dr. Al Decant.        Problem List Patient Active Problem List   Diagnosis Date Noted  . Pre-diabetes 08/10/2015  . Metabolic syndrome 46/28/6381  . Malnutrition of moderate degree ([place]) 05/25/2015  . OA (osteoarthritis) of knee 07/03/2014  . HLD (hyperlipidemia) 01/04/2014  . HTN (hypertension) 01/04/2014  . Anemia, iron deficiency 12/30/2011  . Constipation 04/04/2009  . TUBULOVILLOUS ADENOMA, COLON 04/03/2009   Unique Sillas PT  09/10/2015, 3:28 PM  [facility] [address] [place], [place], [address] Phone: [phone]   Fax:  [phone]

## 2015-09-12 ENCOUNTER — Ambulatory Visit: Payer: Medicare Other | Admitting: Physical Therapy

## 2015-09-12 ENCOUNTER — Encounter: Payer: Self-pay | Admitting: Physical Therapy

## 2015-09-12 DIAGNOSIS — M25561 Pain in right knee: Secondary | ICD-10-CM

## 2015-09-12 DIAGNOSIS — M25661 Stiffness of right knee, not elsewhere classified: Secondary | ICD-10-CM | POA: Diagnosis not present

## 2015-09-12 DIAGNOSIS — R531 Weakness: Secondary | ICD-10-CM | POA: Diagnosis not present

## 2015-09-12 NOTE — Therapy (Signed)
[facility] [place], [place], [address] Phone: [phone]   Fax:  [phone]  Physical Therapy Treatment  Patient Details  Name: Beth [name] MRN: [id number] Date of Birth: 1946-06-23 Referring Provider:  Claretta Fraise, MD  Encounter Date: 09/12/2015      PT End of Session - 09/12/15 1434    Visit Number 10   Number of Visits 12   Date for PT Re-Evaluation 09/17/15   PT Start Time 2355   PT Stop Time 1529   PT Time Calculation (min) 55 min   Activity Tolerance Patient tolerated treatment well   Behavior During Therapy [facility] for tasks assessed/performed      Past Medical History  Diagnosis Date  . Hypertension   . CVA (cerebral infarction)     2006  . Hyperlipidemia   . Blood transfusion without reported diagnosis 2012    anemia;pt denies transfusion stated was only on iron tablet  . Heart murmur   . Nocturia     3-4 times per night  . Gout     left elbow  . Arthritis     Knee both knees  . Anemia   . 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 08-09-14 Right eye  . Family history of anesthesia complication     sister very slow to awaken after anesthesia;severe vomiting   . Stroke ([place]) 2006    x 1 no deficits noted   . Cataract     left    Past Surgical History  Procedure Laterality Date  . Carpal tunnel release Right 1983  . Abdominal hysterectomy  1983  . Colonscopy  June 21, 2014  . Nasal cauterization  2012  . Total knee arthroplasty Right 07/03/2014    Procedure: RIGHT TOTAL KNEE ARTHROPLASTY;  Surgeon: Gearlean Alf, MD;  [facility];  Service: Orthopedics;  Laterality: Right;  . Patellar tendon repair Right 08/11/2014    Procedure: RIGHT PATELLA TENDON REPAIR;  Surgeon: Gearlean Alf, MD;  [facility]: [facility];  Service: Orthopedics;  Laterality: Right;  . Joint replacement  06/2014    right knee  . I&d knee with poly exchange Right 10/02/2014    Procedure: IRRIGATION AND DEBRIDEMENT  RIGHT KNEE WITH POLY EXCHANGE;  Surgeon: Gearlean Alf, MD;  [facility]: [facility];  Service: Orthopedics;  Laterality: Right;  . Excisional total knee arthroplasty with antibiotic spacers Right 02/16/2015    Procedure: RIGHT KNEE RESECTION ARTHROPLASTY WITH ANTIBIOTIC SPACERS;  Surgeon: [name], MD;  [facility]: [facility];  Service: Orthopedics;  Laterality: Right;  . Reimplantation of total knee Right 05/23/2015    Procedure: RIGHT KNEE ARTHROPLASTY REIMPLANTATION;  Surgeon: [name], MD;  [facility];  Service: Orthopedics;  Laterality: Right;    There were no vitals filed for this visit.  Visit Diagnosis:  Generalized weakness  Knee stiffness, right  Right knee pain      Subjective Assessment - 09/12/15 1434    Subjective Reports L knee achiness today but R knee feels fine today. Reports seeing improvements with RLE.   Currently in Pain? No/denies            [facility] PT Assessment - 09/12/15 0001    Assessment   Medical Diagnosis Right total knee arthroplasty revision.   Onset Date/Surgical Date 05/24/15                     [facility] Adult PT Treatment/Exercise - 09/12/15 0001  Knee/Hip Exercises: Aerobic   Nustep L6 x15 min   Knee/Hip Exercises: Standing   Heel Raises Both;2 sets;10 reps   Forward Step Up Right;2 sets;10 reps;Hand Hold: 2;Step Height: 4"   Knee/Hip Exercises: Seated   Long Arc Quad Strengthening;Right;3 sets;10 reps;Weights   Long Arc Quad Weight 2 lbs.   Modalities   Modalities Buyer, retail R VMO/ Quad (1 channel)   Scientist, research (life sciences) Parameters 10/10, 100 bps, 5 sec ramp x15 min   Electrical Stimulation Goals Strength                  PT Short Term Goals - 08/15/15 1352    PT SHORT TERM GOAL #1   Title Ind with HEP.   Time 2   Period Weeks   Status Achieved           PT Long Term Goals - 08/15/15  1353    PT LONG TERM GOAL #1   Title Right hip and knee strength= 4/5 to increase stability for functional activites.   Time 4   Period Weeks   Status On-going   PT LONG TERM GOAL #2   Title Active right knee flexion= 115-120 degrees.   Time 4   Period Weeks   Status On-going   PT LONG TERM GOAL #3   Title Decrease edema to within 2.5 cms of contralateral side to assist with range of motion gains and decrease pain.   Time 4   Period Weeks   Status On-going               Plan - 10/01/15 1516    Clinical Impression Statement Patient is slowly showing improvements during PT with strengthening. Continues to do well with therapeutic exercises although she required verbal cueing today to not allow trunk to posteriorly lean during LAQs. [place] stimulation with quad set was used today to assist in trying to increase R Quad strength. Normal stimulation response noted following removal of the stimulation system. Continue to ambulate with FWW and ambulates and stands in R knee flexion. Denied pain following today's treatment.   Pt will benefit from skilled therapeutic intervention in order to improve on the following deficits Pain;Decreased activity tolerance;Increased edema;Decreased strength;Decreased range of motion   Rehab Potential Good   PT Frequency 2x / week   PT Duration 6 weeks   PT Treatment/Interventions Vasopneumatic Device;Cryotherapy;Therapeutic activities;Therapeutic exercise;Neuromuscular re-education;Patient/family education;Manual techniques;Electrical Stimulation   PT Next Visit Plan FOTO;  Electrical stimulation for VMS/quad strengthening (use two channels large electrodes) for SAQ. Place one channel on RF one on VMO.  Continue HS strengthening, add bridge. modify TE so patient cant cheat with LLE. Recert sent to Dr. Al Decant.   Consulted and Agree with Plan of Care Patient          G-Codes - October 01, 2015 1541    Functional Assessment Tool Used Clinical judgement.    Functional Limitation Mobility: Walking and moving around   Mobility: Walking and Moving Around Current Status [phone]) At least 60 percent but less than 80 percent impaired, limited or restricted   Mobility: Walking and Moving Around Goal Status [phone]) At least 20 percent but less than 40 percent impaired, limited or restricted      Problem List Patient Active Problem List   Diagnosis Date Noted  . Pre-diabetes 08/10/2015  . Metabolic syndrome 82/95/6213  . Malnutrition of moderate degree ([place]) 05/25/2015  . OA (osteoarthritis)  of knee 07/03/2014  . HLD (hyperlipidemia) 01/04/2014  . HTN (hypertension) 01/04/2014  . Anemia, iron deficiency 12/30/2011  . Constipation 04/04/2009  . TUBULOVILLOUS ADENOMA, COLON 04/03/2009    Ahmed Prima, PTA 09/12/2015 3:42 PM Mali Applegate MPT [facility] [address] [place], [place], [address] Phone: [phone]   Fax:  [phone]

## 2015-09-13 DIAGNOSIS — M25562 Pain in left knee: Secondary | ICD-10-CM | POA: Diagnosis not present

## 2015-09-13 DIAGNOSIS — Z471 Aftercare following joint replacement surgery: Secondary | ICD-10-CM | POA: Diagnosis not present

## 2015-09-13 DIAGNOSIS — Z96651 Presence of right artificial knee joint: Secondary | ICD-10-CM | POA: Diagnosis not present

## 2015-09-13 DIAGNOSIS — M1712 Unilateral primary osteoarthritis, left knee: Secondary | ICD-10-CM | POA: Diagnosis not present

## 2015-09-17 ENCOUNTER — Ambulatory Visit: Payer: Medicare Other | Admitting: Physical Therapy

## 2015-09-17 DIAGNOSIS — R531 Weakness: Secondary | ICD-10-CM | POA: Diagnosis not present

## 2015-09-17 DIAGNOSIS — M25661 Stiffness of right knee, not elsewhere classified: Secondary | ICD-10-CM

## 2015-09-17 DIAGNOSIS — M25561 Pain in right knee: Secondary | ICD-10-CM

## 2015-09-17 NOTE — Therapy (Signed)
[facility] [place], [place], [address] Phone: [phone]   Fax:  [phone]  Physical Therapy Treatment  Patient Details  Name: Beth [name] MRN: [id number] Date of Birth: 1946/04/09 Referring Provider:  Claretta Fraise, MD  Encounter Date: 09/17/2015      PT End of Session - 09/17/15 1419    Visit Number 11   Number of Visits 20   Date for PT Re-Evaluation 10/19/15   PT Start Time 9833   PT Stop Time 1520   PT Time Calculation (min) 62 min   Activity Tolerance Patient tolerated treatment well   Behavior During Therapy [facility] for tasks assessed/performed      Past Medical History  Diagnosis Date  . Hypertension   . CVA (cerebral infarction)     2006  . Hyperlipidemia   . Blood transfusion without reported diagnosis 2012    anemia;pt denies transfusion stated was only on iron tablet  . Heart murmur   . Nocturia     3-4 times per night  . Gout     left elbow  . Arthritis     Knee both knees  . Anemia   . 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 08-09-14 Right eye  . Family history of anesthesia complication     sister very slow to awaken after anesthesia;severe vomiting   . Stroke ([place]) 2006    x 1 no deficits noted   . Cataract     left    Past Surgical History  Procedure Laterality Date  . Carpal tunnel release Right 1983  . Abdominal hysterectomy  1983  . Colonscopy  June 21, 2014  . Nasal cauterization  2012  . Total knee arthroplasty Right 07/03/2014    Procedure: RIGHT TOTAL KNEE ARTHROPLASTY;  Surgeon: Gearlean Alf, MD;  Location: [facility];  Service: Orthopedics;  Laterality: Right;  . Patellar tendon repair Right 08/11/2014    Procedure: RIGHT PATELLA TENDON REPAIR;  Surgeon: Gearlean Alf, MD;  Location: [facility];  Service: Orthopedics;  Laterality: Right;  . Joint replacement  06/2014    right knee  . I&d knee with poly exchange Right 10/02/2014    Procedure: IRRIGATION AND DEBRIDEMENT  RIGHT KNEE WITH POLY EXCHANGE;  Surgeon: Gearlean Alf, MD;  [facility]: [facility];  Service: Orthopedics;  Laterality: Right;  . Excisional total knee arthroplasty with antibiotic spacers Right 02/16/2015    Procedure: RIGHT KNEE RESECTION ARTHROPLASTY WITH ANTIBIOTIC SPACERS;  Surgeon: [name], MD;  [facility]: [facility];  Service: Orthopedics;  Laterality: Right;  . Reimplantation of total knee Right 05/23/2015    Procedure: RIGHT KNEE ARTHROPLASTY REIMPLANTATION;  Surgeon: [name], MD;  [facility];  Service: Orthopedics;  Laterality: Right;    There were no vitals filed for this visit.  Visit Diagnosis:  Generalized weakness - Plan: PT plan of care cert/re-cert  Knee stiffness, right - Plan: PT plan of care cert/re-cert  Right knee pain      Subjective Assessment - 09/17/15 1420    Subjective Patient states she saw Dr. Wynelle Link on Thursday and he wants her to continue one more month to further strengthen knee.   Currently in Pain? No/denies            [facility] PT Assessment - 09/17/15 0001    Assessment   Medical Diagnosis Right total knee arthroplasty revision.   Onset Date/Surgical Date 05/24/15   Circumferential Edema   Circumferential - Right 47  cm   ROM / Strength   AROM / PROM / Strength AROM;Strength   AROM   Overall AROM  Deficits   AROM Assessment Site Knee   Right/Left Knee Right   Right Knee Flexion 107   PROM   Right/Left Knee --  -15   Right Knee Flexion 110   Strength   Overall Strength Comments functional weakness of R hip abductors with weight shifting   Strength Assessment Site Hip;Knee   Right/Left Hip Right   Right Hip Flexion 4+/5   Right Hip Extension 4/5   Right Hip ABduction 4/5   Right/Left Knee Right   Right Knee Flexion 5/5   Right Knee Extension 4/5  in available range; unable to do SAQ                     [facility] Adult PT Treatment/Exercise - 09/17/15 0001    Ambulation/Gait   Pre-Gait Activities weight shifting  side to side and heel/toe    Self-Care   Self-Care Other Self-Care Comments   Other Self-Care Comments  Education on importance of thinking about spending more time on RLE with ADLS to help normalize gait; need for possible brace in the future to stabilize knee with gait.   Knee/Hip Exercises: Aerobic   Nustep L8 x10 min   Knee/Hip Exercises: Standing   Hip Abduction Stengthening;Right;3 sets;10 reps   Knee/Hip Exercises: Seated   Sit to Sand 15 reps;with UE support  10 reps with LLE on 2 inch step; one UE    Modalities   Modalities --  attempted [place]; no contraction present                PT Education - 09/17/15 1523    Education provided Yes   Education Details HEP   Person(s) Educated Patient   Methods Demonstration;Explanation;Handout   Comprehension Verbalized understanding;Returned demonstration          PT Short Term Goals - 08/15/15 1352    PT SHORT TERM GOAL #1   Title Ind with HEP.   Time 2   Period Weeks   Status Achieved           PT Long Term Goals - 09/17/15 1532    PT LONG TERM GOAL #1   Title Right hip and knee strength= 4/5 to increase stability for functional activites.   Time 4   Period Weeks   Status On-going   PT LONG TERM GOAL #2   Title Active right knee flexion= 115-120 degrees.   Time 4   Period Weeks   Status On-going   PT LONG TERM GOAL #3   Title Decrease edema to within 2.5 cms of contralateral side to assist with range of motion gains and decrease pain.   Time 4   Period Weeks   Status On-going               Plan - 09/17/15 1532    Clinical Impression Statement Patient has made gains with active knee flexion, but has not reached goal yet. We have been unable to elicit a strong enough quad contraction with NMES (mulitple types and locations of electrodes)  to perform a SAQ. It is unlikely that patient will be able to achieve this, however she has a very good quad contraction with quad sets. Due to this strength  deficit, she may benefit from a supportive brace upon discharge to ensure stability with ambulation. She has gained strength in her RLE elsewhere, but continues  to demonstrate functional RLE weakness with sit to stand and weightshifting onto RLE.    Pt will benefit from skilled therapeutic intervention in order to improve on the following deficits Pain;Decreased activity tolerance;Increased edema;Decreased strength;Decreased range of motion   Rehab Potential Good   Clinical Impairments Affecting Rehab Potential mulitiple knee surgeries   PT Frequency 2x / week   PT Duration 4 weeks   PT Treatment/Interventions Vasopneumatic Device;Cryotherapy;Therapeutic activities;Therapeutic exercise;Neuromuscular re-education;Patient/family education;Manual techniques;Electrical Stimulation;ADLs/Self Care Home Management;Gait training   PT Next Visit Plan Continue Nustep, pregait activites, standing hip ABD, sit to stand and other functional LE strengthening. Recert sent to cover next 4 weeks. Return to Dr. Wynelle Link in 6 weeks. Hold NMES.   Consulted and Agree with Plan of Care Patient        Problem List Patient Active Problem List   Diagnosis Date Noted  . Pre-diabetes 08/10/2015  . Metabolic syndrome 57/32/2025  . Malnutrition of moderate degree ([place]) 05/25/2015  . OA (osteoarthritis) of knee 07/03/2014  . HLD (hyperlipidemia) 01/04/2014  . HTN (hypertension) 01/04/2014  . Anemia, iron deficiency 12/30/2011  . Constipation 04/04/2009  . TUBULOVILLOUS ADENOMA, COLON 04/03/2009   Madelyn Flavors PT  09/17/2015, 3:53 PM  [facility] [address] [place], [place], [address] Phone: [phone]   Fax:  [phone]

## 2015-09-17 NOTE — Patient Instructions (Signed)
WEIGHT SHIFT ONTO RIGHT SIDE: Stand at kitchen sink and shift all of your weight on to your right side. Hold 1 minute then shift back to left leg. Repeat 5 times. 2x/day.   WEIGHT SHIFT FRONT TO BACK:  Stand with right foot in front of left. Shift weight forward onto Right toe lifting your left heel. Then shift weight back onto left foot. Do 10 times.  Switch right foot to be in back. Repeat on this side.  Madelyn Flavors, PT 09/17/2015 3:20 PM [facility] [address] [place], [place], [address] Phone: [phone]   Fax:  [phone]

## 2015-09-20 ENCOUNTER — Ambulatory Visit: Payer: Medicare Other | Admitting: Physical Therapy

## 2015-09-20 DIAGNOSIS — M25561 Pain in right knee: Secondary | ICD-10-CM | POA: Diagnosis not present

## 2015-09-20 DIAGNOSIS — R531 Weakness: Secondary | ICD-10-CM | POA: Diagnosis not present

## 2015-09-20 DIAGNOSIS — M25661 Stiffness of right knee, not elsewhere classified: Secondary | ICD-10-CM | POA: Diagnosis not present

## 2015-09-20 NOTE — Therapy (Signed)
[facility] [place], [place], [address] Phone: [phone]   Fax:  [phone]  Physical Therapy Treatment  Patient Details  Name: Beth [name] MRN: [id number] Date of Birth: Nov 04, 1946 Referring Provider:  Claretta Fraise, MD  Encounter Date: 09/20/2015      PT End of Session - 09/20/15 1432    Visit Number 12   Number of Visits 20   Date for PT Re-Evaluation 10/19/15   PT Start Time 1324   PT Stop Time 1517   PT Time Calculation (min) 46 min   Activity Tolerance Patient tolerated treatment well   Behavior During Therapy [facility] for tasks assessed/performed      Past Medical History  Diagnosis Date  . Hypertension   . CVA (cerebral infarction)     2006  . Hyperlipidemia   . Blood transfusion without reported diagnosis 2012    anemia;pt denies transfusion stated was only on iron tablet  . Heart murmur   . Nocturia     3-4 times per night  . Gout     left elbow  . Arthritis     Knee both knees  . Anemia   . 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 08-09-14 Right eye  . Family history of anesthesia complication     sister very slow to awaken after anesthesia;severe vomiting   . Stroke ([place]) 2006    x 1 no deficits noted   . Cataract     left    Past Surgical History  Procedure Laterality Date  . Carpal tunnel release Right 1983  . Abdominal hysterectomy  1983  . Colonscopy  June 21, 2014  . Nasal cauterization  2012  . Total knee arthroplasty Right 07/03/2014    Procedure: RIGHT TOTAL KNEE ARTHROPLASTY;  Surgeon: Gearlean Alf, MD;  [facility];  Service: Orthopedics;  Laterality: Right;  . Patellar tendon repair Right 08/11/2014    Procedure: RIGHT PATELLA TENDON REPAIR;  Surgeon: Gearlean Alf, MD;  [facility];  Service: Orthopedics;  Laterality: Right;  . Joint replacement  06/2014    right knee  . I&d knee with poly exchange Right 10/02/2014    Procedure: IRRIGATION AND DEBRIDEMENT  RIGHT KNEE WITH POLY EXCHANGE;  Surgeon: Gearlean Alf, MD;  [facility];  Service: Orthopedics;  Laterality: Right;  . Excisional total knee arthroplasty with antibiotic spacers Right 02/16/2015    Procedure: RIGHT KNEE RESECTION ARTHROPLASTY WITH ANTIBIOTIC SPACERS;  Surgeon: [name], MD;  [facility];  Service: Orthopedics;  Laterality: Right;  . Reimplantation of total knee Right 05/23/2015    Procedure: RIGHT KNEE ARTHROPLASTY REIMPLANTATION;  Surgeon: [name], MD;  [facility];  Service: Orthopedics;  Laterality: Right;    There were no vitals filed for this visit.  Visit Diagnosis:  Generalized weakness      Subjective Assessment - 09/20/15 1436    Subjective No new complaints today.   Currently in Pain? No/denies                         [facility] Adult PT Treatment/Exercise - 09/20/15 0001    Ambulation/Gait   Pre-Gait Activities weight shifting side to side and heel/toe x 20 ea   Knee/Hip Exercises: Aerobic   Nustep L6 x10 min  Increase to Level 7 next visit   Knee/Hip Exercises: Standing   Functional Squat 3 sets;10 reps  mini; with ball between legs  Other Standing Knee Exercises clam with R foot on 2 inch block 3x10   Knee/Hip Exercises: Seated   Abduction/Adduction  Strengthening;Both;2 sets;10 reps  10 sec hold; Red Tband   Knee/Hip Exercises: Sidelying   Clams 1X10   VC and TC for HEP                PT Education - 09/20/15 1523    Education provided Yes   Education Details HEP add s/l clams   Person(s) Educated Patient   Methods Explanation;Demonstration   Comprehension Verbalized understanding;Returned demonstration          PT Short Term Goals - 08/15/15 1352    PT SHORT TERM GOAL #1   Title Ind with HEP.   Time 2   Period Weeks   Status Achieved           PT Long Term Goals - 09/17/15 1532    PT LONG TERM GOAL #1   Title Right hip and knee strength= 4/5 to increase stability for functional  activites.   Time 4   Period Weeks   Status On-going   PT LONG TERM GOAL #2   Title Active right knee flexion= 115-120 degrees.   Time 4   Period Weeks   Status On-going   PT LONG TERM GOAL #3   Title Decrease edema to within 2.5 cms of contralateral side to assist with range of motion gains and decrease pain.   Time 4   Period Weeks   Status On-going               Plan - 09/20/15 1523    Clinical Impression Statement Patient demonstrated good heel/toe with gait in RW without VCs. She tolerated therex without increased pain and reports she feels that swelling has decreased since knee feels less stiff.   PT Next Visit Plan Continue Nustep, pregait activites, standing hip ABD, sit to stand and other functional LE strengthening. Recert sent 17/49/44  to cover next 4 weeks. Return to Dr. Wynelle Link in 6 weeks.   Consulted and Agree with Plan of Care Patient        Problem List Patient Active Problem List   Diagnosis Date Noted  . Pre-diabetes 08/10/2015  . Metabolic syndrome 96/75/9163  . Malnutrition of moderate degree ([place]) 05/25/2015  . OA (osteoarthritis) of knee 07/03/2014  . HLD (hyperlipidemia) 01/04/2014  . HTN (hypertension) 01/04/2014  . Anemia, iron deficiency 12/30/2011  . Constipation 04/04/2009  . TUBULOVILLOUS ADENOMA, COLON 04/03/2009   Madelyn Flavors PT  09/20/2015, 3:29 PM  [facility] [address] [place], [place], [address] Phone: [phone]   Fax:  [phone]

## 2015-09-24 ENCOUNTER — Ambulatory Visit: Payer: Medicare Other | Admitting: Physical Therapy

## 2015-09-24 ENCOUNTER — Encounter: Payer: Self-pay | Admitting: Physical Therapy

## 2015-09-24 DIAGNOSIS — M25661 Stiffness of right knee, not elsewhere classified: Secondary | ICD-10-CM

## 2015-09-24 DIAGNOSIS — M25561 Pain in right knee: Secondary | ICD-10-CM | POA: Diagnosis not present

## 2015-09-24 DIAGNOSIS — R531 Weakness: Secondary | ICD-10-CM

## 2015-09-24 NOTE — Therapy (Signed)
[facility] [place], [place], [address] Phone: [phone]   Fax:  [phone]  Physical Therapy Treatment  Patient Details  Name: Beth [name] MRN: [id number] Date of Birth: 04-09-1946 Referring Provider: Dr. Maureen Ralphs  Encounter Date: 09/24/2015      PT End of Session - 09/24/15 1519    Visit Number 13   Number of Visits 20   Date for PT Re-Evaluation 10/19/15   PT Start Time 1518   PT Stop Time 1601   PT Time Calculation (min) 43 min   Activity Tolerance Patient tolerated treatment well   Behavior During Therapy [facility] for tasks assessed/performed      Past Medical History  Diagnosis Date  . Hypertension   . CVA (cerebral infarction)     2006  . Hyperlipidemia   . Blood transfusion without reported diagnosis 2012    anemia;pt denies transfusion stated was only on iron tablet  . Heart murmur   . Nocturia     3-4 times per night  . Gout     left elbow  . Arthritis     Knee both knees  . Anemia   . 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 08-09-14 Right eye  . Family history of anesthesia complication     sister very slow to awaken after anesthesia;severe vomiting   . Stroke ([place]) 2006    x 1 no deficits noted   . Cataract     left    Past Surgical History  Procedure Laterality Date  . Carpal tunnel release Right 1983  . Abdominal hysterectomy  1983  . Colonscopy  June 21, 2014  . Nasal cauterization  2012  . Total knee arthroplasty Right 07/03/2014    Procedure: RIGHT TOTAL KNEE ARTHROPLASTY;  Surgeon: Gearlean Alf, MD;  [facility];  Service: Orthopedics;  Laterality: Right;  . Patellar tendon repair Right 08/11/2014    Procedure: RIGHT PATELLA TENDON REPAIR;  Surgeon: Gearlean Alf, MD;  [facility];  Service: Orthopedics;  Laterality: Right;  . Joint replacement  06/2014    right knee  . I&d knee with poly exchange Right 10/02/2014    Procedure: IRRIGATION AND DEBRIDEMENT RIGHT  KNEE WITH POLY EXCHANGE;  Surgeon: Gearlean Alf, MD;  [facility];  Service: Orthopedics;  Laterality: Right;  . Excisional total knee arthroplasty with antibiotic spacers Right 02/16/2015    Procedure: RIGHT KNEE RESECTION ARTHROPLASTY WITH ANTIBIOTIC SPACERS;  Surgeon: [name], MD;  [facility];  Service: Orthopedics;  Laterality: Right;  . Reimplantation of total knee Right 05/23/2015    Procedure: RIGHT KNEE ARTHROPLASTY REIMPLANTATION;  Surgeon: [name], MD;  [facility];  Service: Orthopedics;  Laterality: Right;    There were no vitals filed for this visit.  Visit Diagnosis:  Generalized weakness  Knee stiffness, right  Right knee pain      Subjective Assessment - 09/24/15 1519    Subjective No new complaints today.   Currently in Pain? No/denies            [facility] PT Assessment - 09/24/15 0001    Assessment   Medical Diagnosis Right total knee arthroplasty revision.   Referring Provider Dr. Maureen Ralphs   Onset Date/Surgical Date 05/24/15                     [facility] Adult PT Treatment/Exercise - 09/24/15 0001    Knee/Hip Exercises: Aerobic   Nustep L7  x16 min   Knee/Hip Exercises: Standing   Forward Step Up Right;2 sets;10 reps;Hand Hold: 2;Step Height: 4"   Functional Squat 3 sets;10 reps  with ball squeeze and mirror for visual feedback   Knee/Hip Exercises: Seated   Long Arc Quad Strengthening;Right;3 sets;10 reps;Weights   Long Arc Quad Weight 2 lbs.   Clamshell with TheraBand Red  x30 reps   Knee/Hip Exercises: Sidelying   Hip ABduction Strengthening;Right;3 sets;10 reps                  PT Short Term Goals - 08/15/15 1352    PT SHORT TERM GOAL #1   Title Ind with HEP.   Time 2   Period Weeks   Status Achieved           PT Long Term Goals - 09/17/15 1532    PT LONG TERM GOAL #1   Title Right hip and knee strength= 4/5 to increase stability for functional activites.   Time 4   Period Weeks   Status  On-going   PT LONG TERM GOAL #2   Title Active right knee flexion= 115-120 degrees.   Time 4   Period Weeks   Status On-going   PT LONG TERM GOAL #3   Title Decrease edema to within 2.5 cms of contralateral side to assist with range of motion gains and decrease pain.   Time 4   Period Weeks   Status On-going               Plan - 09/24/15 1605    Clinical Impression Statement Patient tolerated today's treatment well and demonstrated improved R heel/toe during ambulation with FWW. Patient tolerated exercises well with no complaints of increased pain. Mirror was used for visual feedback to allow patient to see how to attempt to correct standing posture following squat with ball squeeze. Although visual feedback was utilized patient reports feeling unsafe when placing more weight on RLE. Continues to experienced R mid knee tenderness medial to incision where sensations have been felt before during previous treatments. Denied pain following today's treatment.   Pt will benefit from skilled therapeutic intervention in order to improve on the following deficits Pain;Decreased activity tolerance;Increased edema;Decreased strength;Decreased range of motion   Rehab Potential Good   Clinical Impairments Affecting Rehab Potential mulitiple knee surgeries   PT Frequency 2x / week   PT Duration 4 weeks   PT Treatment/Interventions Vasopneumatic Device;Cryotherapy;Therapeutic activities;Therapeutic exercise;Neuromuscular re-education;Patient/family education;Manual techniques;Electrical Stimulation;ADLs/Self Care Home Management;Gait training   PT Next Visit Plan Continue Nustep, pregait activites, standing hip ABD, sit to stand and other functional LE strengthening.    Consulted and Agree with Plan of Care Patient        Problem List Patient Active Problem List   Diagnosis Date Noted  . Pre-diabetes 08/10/2015  . Metabolic syndrome 49/17/9150  . Malnutrition of moderate degree ([place])  05/25/2015  . OA (osteoarthritis) of knee 07/03/2014  . HLD (hyperlipidemia) 01/04/2014  . HTN (hypertension) 01/04/2014  . Anemia, iron deficiency 12/30/2011  . Constipation 04/04/2009  . TUBULOVILLOUS ADENOMA, COLON 04/03/2009    Wynelle Fanny, PTA 09/24/2015, 4:11 PM  [facility] [place], [place], [address] Phone: [phone]   Fax:  [phone]  Name: Beth [name] MRN: [id number] Date of Birth: May 10, 1946

## 2015-09-25 DIAGNOSIS — L03031 Cellulitis of right toe: Secondary | ICD-10-CM | POA: Diagnosis not present

## 2015-09-27 ENCOUNTER — Encounter: Payer: Medicare Other | Admitting: Physical Therapy

## 2015-10-01 ENCOUNTER — Ambulatory Visit: Payer: Medicare Other | Admitting: Physical Therapy

## 2015-10-01 ENCOUNTER — Encounter: Payer: Self-pay | Admitting: Physical Therapy

## 2015-10-01 DIAGNOSIS — R531 Weakness: Secondary | ICD-10-CM

## 2015-10-01 DIAGNOSIS — M25561 Pain in right knee: Secondary | ICD-10-CM

## 2015-10-01 DIAGNOSIS — M25661 Stiffness of right knee, not elsewhere classified: Secondary | ICD-10-CM

## 2015-10-01 NOTE — Therapy (Signed)
[facility] [place], [place], [address] Phone: [phone]   Fax:  [phone]  Physical Therapy Treatment  Patient Details  Name: Beth [name] MRN: [id number] Date of Birth: 06-29-1946 Referring Provider: Dr. Maureen Ralphs  Encounter Date: 10/01/2015      PT End of Session - 10/01/15 1437    Visit Number 14   Number of Visits 20   Date for PT Re-Evaluation 10/19/15   PT Start Time 1430   PT Stop Time 1523   PT Time Calculation (min) 53 min   Activity Tolerance Patient tolerated treatment well   Behavior During Therapy [facility] for tasks assessed/performed      Past Medical History  Diagnosis Date  . Hypertension   . CVA (cerebral infarction)     2006  . Hyperlipidemia   . Blood transfusion without reported diagnosis 2012    anemia;pt denies transfusion stated was only on iron tablet  . Heart murmur   . Nocturia     3-4 times per night  . Gout     left elbow  . Arthritis     Knee both knees  . Anemia   . 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 08-09-14 Right eye  . Family history of anesthesia complication     sister very slow to awaken after anesthesia;severe vomiting   . Stroke ([place]) 2006    x 1 no deficits noted   . Cataract     left    Past Surgical History  Procedure Laterality Date  . Carpal tunnel release Right 1983  . Abdominal hysterectomy  1983  . Colonscopy  June 21, 2014  . Nasal cauterization  2012  . Total knee arthroplasty Right 07/03/2014    Procedure: RIGHT TOTAL KNEE ARTHROPLASTY;  Surgeon: Gearlean Alf, MD;  Location: [facility];  Service: Orthopedics;  Laterality: Right;  . Patellar tendon repair Right 08/11/2014    Procedure: RIGHT PATELLA TENDON REPAIR;  Surgeon: Gearlean Alf, MD;  Location: [facility];  Service: Orthopedics;  Laterality: Right;  . Joint replacement  06/2014    right knee  . I&d knee with poly exchange Right 10/02/2014    Procedure: IRRIGATION AND DEBRIDEMENT RIGHT  KNEE WITH POLY EXCHANGE;  Surgeon: Gearlean Alf, MD;  [facility]: [facility];  Service: Orthopedics;  Laterality: Right;  . Excisional total knee arthroplasty with antibiotic spacers Right 02/16/2015    Procedure: RIGHT KNEE RESECTION ARTHROPLASTY WITH ANTIBIOTIC SPACERS;  Surgeon: [name], MD;  [facility];  Service: Orthopedics;  Laterality: Right;  . Reimplantation of total knee Right 05/23/2015    Procedure: RIGHT KNEE ARTHROPLASTY REIMPLANTATION;  Surgeon: [name], MD;  [facility];  Service: Orthopedics;  Laterality: Right;    There were no vitals filed for this visit.  Visit Diagnosis:  Generalized weakness  Knee stiffness, right  Right knee pain      Subjective Assessment - 10/01/15 1434    Subjective States that she went up and down 4 stairs with a rail and states she did good with it. States that she knew to go up with good leg and down with bad.   Currently in Pain? No/denies            [facility] PT Assessment - 10/01/15 0001    Assessment   Medical Diagnosis Right total knee arthroplasty revision.   Onset Date/Surgical Date 05/24/15   Next MD Visit 10/25/2015   Observation/Other Assessments-Edema    Edema  Circumferential   Circumferential Edema   Circumferential - Right 51.8 cm   Circumferential - Left  47.5 cm                     [facility] Adult PT Treatment/Exercise - 10/01/15 0001    Knee/Hip Exercises: Aerobic   Nustep L6 x15 min   Knee/Hip Exercises: Seated   Long Arc Quad Strengthening;Right;3 sets;10 reps;Weights   Long Arc Quad Weight 2 lbs.   Sit to Sand 10 reps;with UE support  with ball squeeze   Knee/Hip Exercises: Supine   Bridges Limitations 3x10 reps   Straight Leg Raises Strengthening;Right;3 sets;10 reps  more of hip flexion due to lack of knee extension   Other Supine Knee/Hip Exercises B clamshell with red theraband 3x10 reps   Knee/Hip Exercises: Sidelying   Hip ABduction Strengthening;Right;3 sets;10 reps    Modalities   Modalities Vasopneumatic   Vasopneumatic   Number Minutes Vasopneumatic  15 minutes   Vasopnuematic Location  Knee   Vasopneumatic Pressure Medium                  PT Short Term Goals - 08/15/15 1352    PT SHORT TERM GOAL #1   Title Ind with HEP.   Time 2   Period Weeks   Status Achieved           PT Long Term Goals - 09/17/15 1532    PT LONG TERM GOAL #1   Title Right hip and knee strength= 4/5 to increase stability for functional activites.   Time 4   Period Weeks   Status On-going   PT LONG TERM GOAL #2   Title Active right knee flexion= 115-120 degrees.   Time 4   Period Weeks   Status On-going   PT LONG TERM GOAL #3   Title Decrease edema to within 2.5 cms of contralateral side to assist with range of motion gains and decrease pain.   Time 4   Period Weeks   Status On-going               Plan - 10/01/15 1511    Clinical Impression Statement Patient tolerated today's treatment fairly well today with continued use of FWW for ambulation. Presented with significantly increased R knee edema compared to L knee. Patient completed all exercises with no complaints of increased pain. Continues to have a lack of R knee extension and strength as demonstrated with R knee flexion during SLR and hip abduction although verbal cues were given to keep R knee as extended as possible. Continues to complete sit/stands with ball squeeze with decreased RLE weightshifting. Vasopneumatic system was utilized today in efforts to decrease R knee inflammation that was present with normal response. Denied R knee pain following today's treatment.   Pt will benefit from skilled therapeutic intervention in order to improve on the following deficits Pain;Decreased activity tolerance;Increased edema;Decreased strength;Decreased range of motion   Rehab Potential Good   Clinical Impairments Affecting Rehab Potential mulitiple knee surgeries   PT Frequency 2x / week   PT  Duration 4 weeks   PT Treatment/Interventions Vasopneumatic Device;Cryotherapy;Therapeutic activities;Therapeutic exercise;Neuromuscular re-education;Patient/family education;Manual techniques;Electrical Stimulation;ADLs/Self Care Home Management;Gait training   PT Next Visit Plan Continue Nustep, pregait activites, standing hip ABD, sit to stand and other functional LE strengthening.    Consulted and Agree with Plan of Care Patient        Problem List Patient Active Problem List   Diagnosis Date Noted  .  Pre-diabetes 08/10/2015  . Metabolic syndrome 37/09/6268  . Malnutrition of moderate degree ([place]) 05/25/2015  . OA (osteoarthritis) of knee 07/03/2014  . HLD (hyperlipidemia) 01/04/2014  . HTN (hypertension) 01/04/2014  . Anemia, iron deficiency 12/30/2011  . Constipation 04/04/2009  . TUBULOVILLOUS ADENOMA, COLON 04/03/2009    Wynelle Fanny, PTA 10/01/2015, 3:31 PM  [facility] [address] [place], [place], [address] Phone: [phone]   Fax:  [phone]  Name: KAYLAMARIE SWICKARD MRN: [id number] Date of Birth: 1946/04/22

## 2015-10-03 ENCOUNTER — Ambulatory Visit (INDEPENDENT_AMBULATORY_CARE_PROVIDER_SITE_OTHER): Payer: Medicare Other

## 2015-10-03 VITALS — BP 148/87 | HR 60

## 2015-10-03 DIAGNOSIS — I10 Essential (primary) hypertension: Secondary | ICD-10-CM

## 2015-10-03 NOTE — Progress Notes (Signed)
Patient ID: Beth [name], female   DOB: [birth year], 69 y.o.   MRN: [id number]   Patient presents to office today for a recheck of her BP. BP checked the first time at 179/91 with a pulse of 67. Retake of BP 10 minutes later was 148/87 with a pulse of 60. Patient states that her BP is generally always high at home as well. Advised we would forward to provider and would contact her with further instructions

## 2015-10-04 ENCOUNTER — Encounter: Payer: Self-pay | Admitting: Physical Therapy

## 2015-10-04 ENCOUNTER — Ambulatory Visit: Payer: Medicare Other | Admitting: Physical Therapy

## 2015-10-04 DIAGNOSIS — M25561 Pain in right knee: Secondary | ICD-10-CM | POA: Diagnosis not present

## 2015-10-04 DIAGNOSIS — R531 Weakness: Secondary | ICD-10-CM

## 2015-10-04 DIAGNOSIS — M25661 Stiffness of right knee, not elsewhere classified: Secondary | ICD-10-CM

## 2015-10-04 NOTE — Therapy (Signed)
[facility] [place], [place], [address] Phone: [phone]   Fax:  [phone]  Physical Therapy Treatment  Patient Details  Name: Beth [name] MRN: [id number] Date of Birth: 1946-02-03 Referring Provider: Dr. Maureen Ralphs  Encounter Date: 10/04/2015      PT End of Session - 10/04/15 1442    Visit Number 15   Number of Visits 20   Date for PT Re-Evaluation 10/19/15   PT Start Time 1165   PT Stop Time 1521   PT Time Calculation (min) 50 min   Activity Tolerance Patient tolerated treatment well   Behavior During Therapy [facility] for tasks assessed/performed      Past Medical History  Diagnosis Date  . Hypertension   . CVA (cerebral infarction)     2006  . Hyperlipidemia   . Blood transfusion without reported diagnosis 2012    anemia;pt denies transfusion stated was only on iron tablet  . Heart murmur   . Nocturia     3-4 times per night  . Gout     left elbow  . Arthritis     Knee both knees  . Anemia   . 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 08-09-14 Right eye  . Family history of anesthesia complication     sister very slow to awaken after anesthesia;severe vomiting   . Stroke ([place]) 2006    x 1 no deficits noted   . Cataract     left    Past Surgical History  Procedure Laterality Date  . Carpal tunnel release Right 1983  . Abdominal hysterectomy  1983  . Colonscopy  June 21, 2014  . Nasal cauterization  2012  . Total knee arthroplasty Right 07/03/2014    Procedure: RIGHT TOTAL KNEE ARTHROPLASTY;  Surgeon: Gearlean Alf, MD;  [facility];  Service: Orthopedics;  Laterality: Right;  . Patellar tendon repair Right 08/11/2014    Procedure: RIGHT PATELLA TENDON REPAIR;  Surgeon: Gearlean Alf, MD;  [facility];  Service: Orthopedics;  Laterality: Right;  . Joint replacement  06/2014    right knee  . I&d knee with poly exchange Right 10/02/2014    Procedure: IRRIGATION AND DEBRIDEMENT RIGHT  KNEE WITH POLY EXCHANGE;  Surgeon: Gearlean Alf, MD;  [facility];  Service: Orthopedics;  Laterality: Right;  . Excisional total knee arthroplasty with antibiotic spacers Right 02/16/2015    Procedure: RIGHT KNEE RESECTION ARTHROPLASTY WITH ANTIBIOTIC SPACERS;  Surgeon: [name], MD;  [facility];  Service: Orthopedics;  Laterality: Right;  . Reimplantation of total knee Right 05/23/2015    Procedure: RIGHT KNEE ARTHROPLASTY REIMPLANTATION;  Surgeon: [name], MD;  [facility];  Service: Orthopedics;  Laterality: Right;    There were no vitals filed for this visit.  Visit Diagnosis:  Generalized weakness  Knee stiffness, right  Right knee pain      Subjective Assessment - 10/04/15 1438    Subjective Asked regarding brace that was sent previously.   Currently in Pain? No/denies            [facility] PT Assessment - 10/04/15 0001    Assessment   Medical Diagnosis Right total knee arthroplasty revision.   Onset Date/Surgical Date 05/24/15   Next MD Visit 10/25/2015   ROM / Strength   AROM / PROM / Strength AROM;Strength   AROM   Overall AROM  Deficits   AROM Assessment Site Knee   Right/Left Knee Right  Right Knee Flexion 105   Strength   Strength Assessment Site Hip;Knee   Right/Left Hip Right   Right Hip Flexion 4/5   Right Hip ABduction 4/5   Right/Left Knee Right   Right Knee Flexion 4+/5   Right Knee Extension 4/5                     [facility] Adult PT Treatment/Exercise - 10/04/15 0001    Knee/Hip Exercises: Aerobic   Nustep L6 x15 min   Knee/Hip Exercises: Seated   Long Arc Quad Strengthening;Right;3 sets;10 reps;Weights   Long Arc Quad Weight 2 lbs.   Sit to Sand 20 reps;with UE support  with ball squeeze and VCs for even weightbearing   Knee/Hip Exercises: Supine   Bridges Limitations 3x10 reps   Straight Leg Raises Strengthening;Right;3 sets;10 reps   Knee/Hip Exercises: Sidelying   Hip ABduction Strengthening;Right;3  sets;10 reps   Modalities   Modalities Vasopneumatic   Vasopneumatic   Number Minutes Vasopneumatic  15 minutes   Vasopnuematic Location  Knee   Vasopneumatic Pressure Medium   Vasopneumatic Temperature  57                  PT Short Term Goals - 08/15/15 1352    PT SHORT TERM GOAL #1   Title Ind with HEP.   Time 2   Period Weeks   Status Achieved           PT Long Term Goals - 10/04/15 1514    PT LONG TERM GOAL #1   Title Right hip and knee strength= 4/5 to increase stability for functional activites.   Time 4   Period Weeks   Status Achieved   PT LONG TERM GOAL #2   Title Active right knee flexion= 115-120 degrees.   Time 4   Period Weeks   Status On-going  AROM R knee 105 deg flexion   PT LONG TERM GOAL #3   Title Decrease edema to within 2.5 cms of contralateral side to assist with range of motion gains and decrease pain.   Time 4   Period Weeks   Status On-going               Plan - 10/04/15 1456    Clinical Impression Statement Patient tolerated today's treatment well with no complaints of increased pain. Continues to ambulate with FWW and stands in 38 deg of R knee flexion. R knee MMT flex 4+/5, ext 4/5. R knee hip and knee strength and ROM have stabilized in the same range since evaluation. Continues to present with significantly increased R knee infkammation than the L knee. Continues to have decreased RLE weightshifting during ambulation or in stance. Patient was encouraged to call Dr. Maureen Ralphs to see if she could get an earlier appointment as well as to ask regarding a stability brace to better stabilitze patient in standing and ambulation. Normal vasopneumatic system response noted following removal of the system. Denied R knee pain following today's treatment.   Pt will benefit from skilled therapeutic intervention in order to improve on the following deficits Pain;Decreased activity tolerance;Increased edema;Decreased strength;Decreased range  of motion   Rehab Potential Good   Clinical Impairments Affecting Rehab Potential mulitiple knee surgeries   PT Frequency 2x / week   PT Duration 4 weeks   PT Treatment/Interventions Vasopneumatic Device;Cryotherapy;Therapeutic activities;Therapeutic exercise;Neuromuscular re-education;Patient/family education;Manual techniques;Electrical Stimulation;ADLs/Self Care Home Management;Gait training   PT Next Visit Plan Place on hold pending MD response about stability  brace.   Consulted and Agree with Plan of Care Patient        Problem List Patient Active Problem List   Diagnosis Date Noted  . Pre-diabetes 08/10/2015  . Metabolic syndrome 35/45/6256  . Malnutrition of moderate degree ([place]) 05/25/2015  . OA (osteoarthritis) of knee 07/03/2014  . HLD (hyperlipidemia) 01/04/2014  . HTN (hypertension) 01/04/2014  . Anemia, iron deficiency 12/30/2011  . Constipation 04/04/2009  . TUBULOVILLOUS ADENOMA, COLON 04/03/2009    Orlanda Frankum, [place] MPT 10/04/2015, 5:44 PM  [facility] [address] [place], [place], [address] Phone: [phone]   Fax:  [phone]  Name: Beth [name] MRN: [id number] Date of Birth: 05-Dec-1946

## 2015-10-04 NOTE — Therapy (Signed)
[facility] [place], [place], [address] Phone: [phone]   Fax:  [phone]  Physical Therapy Treatment  Patient Details  Name: Beth [name] MRN: [id number] Date of Birth: May 14, 1946 Referring Provider: Dr. Maureen Ralphs  Encounter Date: 10/04/2015      PT End of Session - 10/04/15 1442    Visit Number 15   Number of Visits 20   Date for PT Re-Evaluation 10/19/15   PT Start Time 2353   PT Stop Time 1521   PT Time Calculation (min) 50 min   Activity Tolerance Patient tolerated treatment well   Behavior During Therapy [facility] for tasks assessed/performed      Past Medical History  Diagnosis Date  . Hypertension   . CVA (cerebral infarction)     2006  . Hyperlipidemia   . Blood transfusion without reported diagnosis 2012    anemia;pt denies transfusion stated was only on iron tablet  . Heart murmur   . Nocturia     3-4 times per night  . Gout     left elbow  . Arthritis     Knee both knees  . Anemia   . 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 08-09-14 Right eye  . Family history of anesthesia complication     sister very slow to awaken after anesthesia;severe vomiting   . Stroke ([place]) 2006    x 1 no deficits noted   . Cataract     left    Past Surgical History  Procedure Laterality Date  . Carpal tunnel release Right 1983  . Abdominal hysterectomy  1983  . Colonscopy  June 21, 2014  . Nasal cauterization  2012  . Total knee arthroplasty Right 07/03/2014    Procedure: RIGHT TOTAL KNEE ARTHROPLASTY;  Surgeon: Gearlean Alf, MD;  [facility]: [facility];  Service: Orthopedics;  Laterality: Right;  . Patellar tendon repair Right 08/11/2014    Procedure: RIGHT PATELLA TENDON REPAIR;  Surgeon: Gearlean Alf, MD;  [facility]: [facility];  Service: Orthopedics;  Laterality: Right;  . Joint replacement  06/2014    right knee  . I&d knee with poly exchange Right 10/02/2014    Procedure: IRRIGATION AND DEBRIDEMENT RIGHT  KNEE WITH POLY EXCHANGE;  Surgeon: Gearlean Alf, MD;  [facility];  Service: Orthopedics;  Laterality: Right;  . Excisional total knee arthroplasty with antibiotic spacers Right 02/16/2015    Procedure: RIGHT KNEE RESECTION ARTHROPLASTY WITH ANTIBIOTIC SPACERS;  Surgeon: [name], MD;  [facility];  Service: Orthopedics;  Laterality: Right;  . Reimplantation of total knee Right 05/23/2015    Procedure: RIGHT KNEE ARTHROPLASTY REIMPLANTATION;  Surgeon: [name], MD;  [facility];  Service: Orthopedics;  Laterality: Right;    There were no vitals filed for this visit.  Visit Diagnosis:  Generalized weakness  Knee stiffness, right  Right knee pain      Subjective Assessment - 10/04/15 1438    Subjective Asked regarding brace that was sent previously.   Currently in Pain? No/denies            [facility] PT Assessment - 10/04/15 0001    Assessment   Medical Diagnosis Right total knee arthroplasty revision.   Onset Date/Surgical Date 05/24/15   Next MD Visit 10/25/2015   ROM / Strength   AROM / PROM / Strength AROM;Strength   AROM   Overall AROM  Deficits   AROM Assessment Site Knee   Right/Left Knee Right  Right Knee Flexion 105   Strength   Strength Assessment Site Hip;Knee   Right/Left Hip Right   Right Hip Flexion 4/5   Right Hip ABduction 4/5   Right/Left Knee Right   Right Knee Flexion 4+/5   Right Knee Extension 4/5                     [facility] Adult PT Treatment/Exercise - 10/04/15 0001    Knee/Hip Exercises: Aerobic   Nustep L6 x15 min   Knee/Hip Exercises: Seated   Long Arc Quad Strengthening;Right;3 sets;10 reps;Weights   Long Arc Quad Weight 2 lbs.   Sit to Sand 20 reps;with UE support  with ball squeeze and VCs for even weightbearing   Knee/Hip Exercises: Supine   Bridges Limitations 3x10 reps   Straight Leg Raises Strengthening;Right;3 sets;10 reps   Knee/Hip Exercises: Sidelying   Hip ABduction Strengthening;Right;3  sets;10 reps   Modalities   Modalities Vasopneumatic   Vasopneumatic   Number Minutes Vasopneumatic  15 minutes   Vasopnuematic Location  Knee   Vasopneumatic Pressure Medium   Vasopneumatic Temperature  57                  PT Short Term Goals - 08/15/15 1352    PT SHORT TERM GOAL #1   Title Ind with HEP.   Time 2   Period Weeks   Status Achieved           PT Long Term Goals - 10/04/15 1514    PT LONG TERM GOAL #1   Title Right hip and knee strength= 4/5 to increase stability for functional activites.   Time 4   Period Weeks   Status Achieved   PT LONG TERM GOAL #2   Title Active right knee flexion= 115-120 degrees.   Time 4   Period Weeks   Status On-going  AROM R knee 105 deg flexion   PT LONG TERM GOAL #3   Title Decrease edema to within 2.5 cms of contralateral side to assist with range of motion gains and decrease pain.   Time 4   Period Weeks   Status On-going               Plan - 10/04/15 1456    Clinical Impression Statement Patient tolerated today's treatment well with no complaints of increased pain. Continues to ambulate with FWW and stands in 38 deg of R knee flexion. R knee MMT flex 4+/5, ext 4/5. R knee hip and knee strength and ROM have stabilized in the same range since evaluation. Continues to present with significantly increased R knee infkammation than the L knee. Continues to have decreased RLE weightshifting during ambulation or in stance. Patient was encouraged to call Dr. Maureen Ralphs to see if she could get an earlier appointment as well as to ask regarding a stability brace to better stabilitze patient in standing and ambulation. Normal vasopneumatic system response noted following removal of the system. Denied R knee pain following today's treatment.   Pt will benefit from skilled therapeutic intervention in order to improve on the following deficits Pain;Decreased activity tolerance;Increased edema;Decreased strength;Decreased range  of motion   Rehab Potential Good   Clinical Impairments Affecting Rehab Potential mulitiple knee surgeries   PT Frequency 2x / week   PT Duration 4 weeks   PT Treatment/Interventions Vasopneumatic Device;Cryotherapy;Therapeutic activities;Therapeutic exercise;Neuromuscular re-education;Patient/family education;Manual techniques;Electrical Stimulation;ADLs/Self Care Home Management;Gait training   PT Next Visit Plan Place on hold pending MD response about stability  brace.   Consulted and Agree with Plan of Care Patient        Problem List Patient Active Problem List   Diagnosis Date Noted  . Pre-diabetes 08/10/2015  . Metabolic syndrome 65/46/5035  . Malnutrition of moderate degree ([place]) 05/25/2015  . OA (osteoarthritis) of knee 07/03/2014  . HLD (hyperlipidemia) 01/04/2014  . HTN (hypertension) 01/04/2014  . Anemia, iron deficiency 12/30/2011  . Constipation 04/04/2009  . TUBULOVILLOUS ADENOMA, COLON 04/03/2009    Ahmed Prima, PTA 10/04/2015 4:02 PM  [facility] [place], [place], [address] Phone: [phone]   Fax:  [phone]  Name: Beth [name] MRN: [id number] Date of Birth: 05/09/1946

## 2015-10-10 ENCOUNTER — Ambulatory Visit (INDEPENDENT_AMBULATORY_CARE_PROVIDER_SITE_OTHER): Payer: Medicare Other

## 2015-10-10 DIAGNOSIS — Z780 Asymptomatic menopausal state: Secondary | ICD-10-CM

## 2015-10-12 ENCOUNTER — Telehealth: Payer: Self-pay | Admitting: Family Medicine

## 2015-10-12 MED ORDER — ONETOUCH ULTRA BLUE VI STRP: ORAL_STRIP | Status: DC

## 2015-10-12 NOTE — Telephone Encounter (Signed)
Detailed message left for patient that rx has been sent to pharmacy.

## 2015-10-15 ENCOUNTER — Telehealth: Payer: Self-pay | Admitting: Family Medicine

## 2015-10-15 NOTE — Telephone Encounter (Signed)
Stp and reviewed dexa. Pt voiced understanding.

## 2015-10-15 NOTE — Telephone Encounter (Signed)
Please review dexa scan and advise 

## 2015-10-15 NOTE — Telephone Encounter (Signed)
Results show no sign of osteoporosis. It is always a good idea to take Calcium 600 mg. BID and Vitamin D 2000 units daily.

## 2015-10-16 DIAGNOSIS — L03031 Cellulitis of right toe: Secondary | ICD-10-CM | POA: Diagnosis not present

## 2015-10-17 ENCOUNTER — Telehealth: Payer: Self-pay | Admitting: *Deleted

## 2015-10-17 ENCOUNTER — Other Ambulatory Visit: Payer: Self-pay | Admitting: Family Medicine

## 2015-10-17 NOTE — Telephone Encounter (Signed)
Opened in error

## 2015-10-25 DIAGNOSIS — M1712 Unilateral primary osteoarthritis, left knee: Secondary | ICD-10-CM | POA: Diagnosis not present

## 2015-10-25 DIAGNOSIS — T814XXD Infection following a procedure, subsequent encounter: Secondary | ICD-10-CM | POA: Diagnosis not present

## 2015-10-25 DIAGNOSIS — Z471 Aftercare following joint replacement surgery: Secondary | ICD-10-CM | POA: Diagnosis not present

## 2015-10-25 DIAGNOSIS — Z96651 Presence of right artificial knee joint: Secondary | ICD-10-CM | POA: Diagnosis not present

## 2015-10-26 ENCOUNTER — Telehealth: Payer: Self-pay | Admitting: Family Medicine

## 2015-10-26 DIAGNOSIS — E119 Type 2 diabetes mellitus without complications: Secondary | ICD-10-CM | POA: Diagnosis not present

## 2015-10-26 DIAGNOSIS — H25813 Combined forms of age-related cataract, bilateral: Secondary | ICD-10-CM | POA: Diagnosis not present

## 2015-10-26 NOTE — Telephone Encounter (Signed)
Spoke with patient.

## 2015-11-06 ENCOUNTER — Ambulatory Visit: Payer: Medicare Other | Attending: Orthopedic Surgery | Admitting: Physical Therapy

## 2015-11-06 ENCOUNTER — Other Ambulatory Visit: Payer: Self-pay | Admitting: *Deleted

## 2015-11-06 DIAGNOSIS — M25562 Pain in left knee: Secondary | ICD-10-CM | POA: Insufficient documentation

## 2015-11-06 DIAGNOSIS — M25661 Stiffness of right knee, not elsewhere classified: Secondary | ICD-10-CM | POA: Insufficient documentation

## 2015-11-06 DIAGNOSIS — R531 Weakness: Secondary | ICD-10-CM | POA: Insufficient documentation

## 2015-11-06 DIAGNOSIS — M25662 Stiffness of left knee, not elsewhere classified: Secondary | ICD-10-CM | POA: Insufficient documentation

## 2015-11-06 MED ORDER — METFORMIN HCL ER 500 MG PO TB24: 500.0000 mg | ORAL_TABLET | Freq: Every day | ORAL | Status: DC

## 2015-11-06 NOTE — Therapy (Signed)
[facility] [place], [place], [address] Phone: [phone]   Fax:  [phone]  Physical Therapy Treatment  Patient Details  Name: Beth [name] MRN: [id number] Date of Birth: June 06, 1946 Referring Provider: Dr. Wynelle Link  Encounter Date: 11/06/2015      PT End of Session - 11/06/15 1428    Visit Number 16   Number of Visits 23   Date for PT Re-Evaluation 12/07/15   PT Start Time [id number]   PT Stop Time 1523   PT Time Calculation (min) 54 min   Activity Tolerance Patient tolerated treatment well;Patient limited by pain   Behavior During Therapy [facility] for tasks assessed/performed      Past Medical History  Diagnosis Date  . Hypertension   . CVA (cerebral infarction)     2006  . Hyperlipidemia   . Blood transfusion without reported diagnosis 2012    anemia;pt denies transfusion stated was only on iron tablet  . Heart murmur   . Nocturia     3-4 times per night  . Gout     left elbow  . Arthritis     Knee both knees  . Anemia   . 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 08-09-14 Right eye  . Family history of anesthesia complication     sister very slow to awaken after anesthesia;severe vomiting   . Stroke ([place]) 2006    x 1 no deficits noted   . Cataract     left    Past Surgical History  Procedure Laterality Date  . Carpal tunnel release Right 1983  . Abdominal hysterectomy  1983  . Colonscopy  June 21, 2014  . Nasal cauterization  2012  . Total knee arthroplasty Right 07/03/2014    Procedure: RIGHT TOTAL KNEE ARTHROPLASTY;  Surgeon: Gearlean Alf, MD;  [facility];  Service: Orthopedics;  Laterality: Right;  . Patellar tendon repair Right 08/11/2014    Procedure: RIGHT PATELLA TENDON REPAIR;  Surgeon: Gearlean Alf, MD;  [facility];  Service: Orthopedics;  Laterality: Right;  . Joint replacement  06/2014    right knee  . I&d knee with poly exchange Right 10/02/2014    Procedure: IRRIGATION  AND DEBRIDEMENT RIGHT KNEE WITH POLY EXCHANGE;  Surgeon: Gearlean Alf, MD;  [facility];  Service: Orthopedics;  Laterality: Right;  . Excisional total knee arthroplasty with antibiotic spacers Right 02/16/2015    Procedure: RIGHT KNEE RESECTION ARTHROPLASTY WITH ANTIBIOTIC SPACERS;  Surgeon: [name], MD;  [facility];  Service: Orthopedics;  Laterality: Right;  . Reimplantation of total knee Right 05/23/2015    Procedure: RIGHT KNEE ARTHROPLASTY REIMPLANTATION;  Surgeon: [name], MD;  [facility];  Service: Orthopedics;  Laterality: Right;    There were no vitals filed for this visit.  Visit Diagnosis:  Lateral knee pain, left - Plan: PT plan of care cert/re-cert  Generalized weakness - Plan: PT plan of care cert/re-cert  Knee stiffness, right - Plan: PT plan of care cert/re-cert  Knee stiffness, left - Plan: PT plan of care cert/re-cert      Subjective Assessment - 11/06/15 1439    Subjective Patient reports Dr. Wynelle Link wants her to wear her short brace on R for stability. She reports increased pain in her L knee and has discussed TKR with Dr. for possibly next spring. She reports increased difficulty with amb and transfers.   Patient Stated Goals to be able to walk with a  quad cane   Currently in Pain? Yes   Pain Score 8    Pain Location Knee   Pain Orientation Left   Pain Descriptors / Indicators Sharp   Pain Type Chronic pain   Pain Onset More than a month ago   Pain Frequency Constant   Aggravating Factors  any movement   Pain Relieving Factors biofreeze and ice   Effect of Pain on Daily Activities difficulty walking, getting in/out of car   Multiple Pain Sites No            [facility] PT Assessment - 11/06/15 0001    Assessment   Medical Diagnosis Right total knee arthroplasty revision.   Referring Provider Dr. Wynelle Link   Onset Date/Surgical Date 05/24/15   Next MD Visit 12/19/14   Observation/Other Assessments-Edema    Edema Circumferential    Circumferential Edema   Circumferential - Right 53 cm   ROM / Strength   AROM / PROM / Strength AROM;Strength   AROM   Overall AROM  Deficits   AROM Assessment Site Knee   Right/Left Knee Right;Left   Right Knee Extension -13   Right Knee Flexion 107   Left Knee Extension -17   Left Knee Flexion 91   PROM   PROM Assessment Site Knee   Right/Left Knee Left   Left Knee Extension -17  painful   Left Knee Flexion 97   Strength   Strength Assessment Site Hip   Right/Left Hip Right;Left   Right Hip Flexion 4+/5   Right Hip ABduction 4/5   Left Hip Flexion 5/5   Left Hip ABduction 2/5   Right/Left Knee Right;Left   Right Knee Flexion 5/5   Right Knee Extension 5/5  in available range   Left Knee Flexion 5/5   Left Knee Extension 5/5   Palpation   Palpation comment distal L ITB                     [facility] Adult PT Treatment/Exercise - 11/06/15 0001    Ambulation/Gait   Ambulation/Gait Assistance --  CGA   Ambulation Distance (Feet) 10 Feet   Assistive device Large base quad cane   Gait Pattern Step-to pattern;Decreased stance time - left;Decreased stride length;Decreased step length - right   Ambulation Surface Level   Knee/Hip Exercises: Stretches   Passive Hamstring Stretch 3 reps;30 seconds   Other Knee/Hip Stretches ITB stretch with stretch 2x30 sec with strap/sheet   Knee/Hip Exercises: Aerobic   Nustep L4x15   Manual Therapy   Manual Therapy Soft tissue mobilization   Soft tissue mobilization L ITB strumming                PT Education - 11/06/15 1534    Education provided Yes   Education Details HEP   Person(s) Educated Patient   Methods Explanation;Demonstration;Handout   Comprehension Returned demonstration;Verbalized understanding          PT Short Term Goals - 08/15/15 1352    PT SHORT TERM GOAL #1   Title Ind with HEP.   Time 2   Period Weeks   Status Achieved           PT Long Term Goals - 11/06/15 1542    PT  LONG TERM GOAL #1   Title Right hip and knee strength= 4/5 to increase stability for functional activites.   Time 4   Period Weeks   Status Achieved   PT LONG TERM GOAL #2   Title  Active right knee flexion= 115-120 degrees.   Time 4   Period Weeks   Status On-going   PT LONG TERM GOAL #3   Title Decrease edema to within 2.5 cms of contralateral side to assist with range of motion gains and decrease pain.   Time 4   Period Weeks   Status On-going   PT LONG TERM GOAL #4   Title decreased pain in R knee to 4/10 or less with ADLS/ambulation   Time 4   Period Weeks   Status New   PT LONG TERM GOAL #5   Title able to amb 75 ft with [facility] with SBA in clinic   Time 4   Period Weeks   Status New   Additional Long Term Goals   Additional Long Term Goals Yes   PT LONG TERM GOAL #6   Title increased L hip ABD strength to 4/10 or better.   Time 4   Period Weeks   Status New               Plan - 11/06/15 1534    Clinical Impression Statement Patient presents to PT after seeing MD for f/u appt regarding R TKR. She now has increased pain in the L knee affecting gait and transfers. She has increased strength RLE since last assessment and continues to have limited ROM in B knees. She amb with RW but would like to be able to use a LBQC.  She has tighness and pain in the L ITB and significant weakness in L hip ABD due to pain.   Pt will benefit from skilled therapeutic intervention in order to improve on the following deficits Pain;Decreased activity tolerance;Increased edema;Decreased strength;Decreased range of motion   Rehab Potential Good   Clinical Impairments Affecting Rehab Potential mulitiple knee surgeries   PT Frequency 2x / week   PT Duration 4 weeks   PT Treatment/Interventions Vasopneumatic Device;Cryotherapy;Therapeutic activities;Therapeutic exercise;Neuromuscular re-education;Patient/family education;Manual techniques;Electrical Stimulation;ADLs/Self Care Home  Management;Gait training   PT Next Visit Plan STW to L ITB, gait training with [facility], continued LE strengthening to increase function.   PT Home Exercise Plan ITB and HS stretch   Consulted and Agree with Plan of Care Patient        Problem List Patient Active Problem List   Diagnosis Date Noted  . Pre-diabetes 08/10/2015  . Metabolic syndrome [id number]  . Malnutrition of moderate degree ([place]) 05/25/2015  . OA (osteoarthritis) of knee 07/03/2014  . HLD (hyperlipidemia) 01/04/2014  . HTN (hypertension) 01/04/2014  . Anemia, iron deficiency 12/30/2011  . Constipation 04/04/2009  . TUBULOVILLOUS ADENOMA, COLON 04/03/2009    Madelyn Flavors PT  11/06/2015, 3:52 PM  [facility] [address] [place], [place], [address] Phone: [phone]   Fax:  [phone]  Name: Beth [name] MRN: [id number] Date of Birth: Mar 10, 1946

## 2015-11-06 NOTE — Patient Instructions (Signed)
  Outer Hip Stretch: Reclined IT Band Stretch (Strap)   Strap around opposite foot, pull across only as far as possible with shoulders on mat. Hold for __30__ seconds. Repeat __3__ times each leg. 2-3 x/day.  Copyright  VHI. All rights reserved.    Hamstring Stretch, Reclined (Strap, Doorframe)   Lengthen bottom leg on floor. Extend top leg along edge of doorframe or press foot up into yoga strap. Hold for 30 seconds. Repeat 3_ times each leg.  Leg Extension (Hamstring)   Sit toward front edge of chair, with leg out straight, heel on step stool, toes pointing toward body. Keeping back straight, bend forward at hip, breathing out through pursed lips. Hold 30 seconds. Repeat _3__ times. Repeat with other leg. Do 2___ sessions per day.   Beth [name], PT 11/06/2015 3:16 PM [facility] [address] [place], [place], [address] Phone: [phone]   Fax:  [phone]

## 2015-11-08 ENCOUNTER — Ambulatory Visit: Payer: Medicare Other | Attending: Orthopedic Surgery | Admitting: Physical Therapy

## 2015-11-08 DIAGNOSIS — M25561 Pain in right knee: Secondary | ICD-10-CM | POA: Diagnosis not present

## 2015-11-08 DIAGNOSIS — M25562 Pain in left knee: Secondary | ICD-10-CM | POA: Diagnosis not present

## 2015-11-08 DIAGNOSIS — R531 Weakness: Secondary | ICD-10-CM | POA: Diagnosis not present

## 2015-11-08 DIAGNOSIS — M25662 Stiffness of left knee, not elsewhere classified: Secondary | ICD-10-CM | POA: Diagnosis not present

## 2015-11-08 DIAGNOSIS — M25661 Stiffness of right knee, not elsewhere classified: Secondary | ICD-10-CM | POA: Diagnosis not present

## 2015-11-08 NOTE — Therapy (Signed)
[facility] [place], [place], [address] Phone: [phone]   Fax:  [phone]  Physical Therapy Treatment  Patient Details  Name: Beth [name] MRN: [id number] Date of Birth: [birth year] Referring Provider: Dr. Wynelle Link  Encounter Date: 11/08/2015      PT End of Session - 11/08/15 1353    Visit Number 17   Number of Visits 23   Date for PT Re-Evaluation 12/07/15   PT Start Time 1350   PT Stop Time 1435   PT Time Calculation (min) 45 min      Past Medical History  Diagnosis Date  . Hypertension   . CVA (cerebral infarction)     2006  . Hyperlipidemia   . Blood transfusion without reported diagnosis 2012    anemia;pt denies transfusion stated was only on iron tablet  . Heart murmur   . Nocturia     3-4 times per night  . Gout     left elbow  . Arthritis     Knee both knees  . Anemia   . 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 08-09-14 Right eye  . Family history of anesthesia complication     sister very slow to awaken after anesthesia;severe vomiting   . Stroke ([place]) 2006    x 1 no deficits noted   . Cataract     left    Past Surgical History  Procedure Laterality Date  . Carpal tunnel release Right 1983  . Abdominal hysterectomy  1983  . Colonscopy  June 21, 2014  . Nasal cauterization  2012  . Total knee arthroplasty Right 07/03/2014    Procedure: RIGHT TOTAL KNEE ARTHROPLASTY;  Surgeon: Gearlean Alf, MD;  [facility];  Service: Orthopedics;  Laterality: Right;  . Patellar tendon repair Right 08/11/2014    Procedure: RIGHT PATELLA TENDON REPAIR;  Surgeon: Gearlean Alf, MD;  [facility];  Service: Orthopedics;  Laterality: Right;  . Joint replacement  06/2014    right knee  . I&d knee with poly exchange Right 10/02/2014    Procedure: IRRIGATION AND DEBRIDEMENT RIGHT KNEE WITH POLY EXCHANGE;  Surgeon: Gearlean Alf, MD;  [facility];  Service: Orthopedics;  Laterality: Right;   . Excisional total knee arthroplasty with antibiotic spacers Right 02/16/2015    Procedure: RIGHT KNEE RESECTION ARTHROPLASTY WITH ANTIBIOTIC SPACERS;  Surgeon: [name], MD;  [facility];  Service: Orthopedics;  Laterality: Right;  . Reimplantation of total knee Right 05/23/2015    Procedure: RIGHT KNEE ARTHROPLASTY REIMPLANTATION;  Surgeon: [name], MD;  [facility];  Service: Orthopedics;  Laterality: Right;    There were no vitals filed for this visit.  Visit Diagnosis:  Generalized weakness  Right knee pain  Lateral knee pain, left  Knee stiffness, right  Knee stiffness, left      Subjective Assessment - 11/08/15 1355    Subjective My left knee feels a little better today. I did the exercises and that helped. My R knee hurts on the inside. Do you think it could be from the brace? I haven't had much pain in the R before wearing the brace.   Patient Stated Goals to be able to walk with a quad cane   Currently in Pain? Yes   Pain Score 6    Pain Location Knee   Pain Orientation Left   Pain Descriptors / Indicators Sharp   Pain Type Chronic pain   Pain Onset More  than a month ago   Multiple Pain Sites Yes   Pain Score 3   Pain Location Knee   Pain Orientation Right;Medial   Pain Descriptors / Indicators Aching   Pain Type Surgical pain   Pain Onset More than a month ago   Pain Frequency Intermittent   Aggravating Factors  not sure, possibly brace            [facility] PT Assessment - 11/08/15 0001    Observation/Other Assessments-Edema    Edema Circumferential   Circumferential Edema   Circumferential - Right 56 cm                     [facility] Adult PT Treatment/Exercise - 11/08/15 0001    Knee/Hip Exercises: Stretches   Passive Hamstring Stretch 30 seconds;Left;2 reps   Knee/Hip Exercises: Aerobic   Nustep L5x 12 min   Knee/Hip Exercises: Supine   Bridges Limitations 1x10 pt c/o pain in L knee   Straight Leg Raises  Strengthening;Right;3 sets;10 reps   Knee/Hip Exercises: Sidelying   Hip ABduction Strengthening;Right;3 sets;10 reps   Hip ABduction Limitations patient will compensate with quad; requires mod A to maintain correct form   Manual Therapy   Manual Therapy Soft tissue mobilization   Soft tissue mobilization MFR to L ITB and STW to L lateral HS                  PT Short Term Goals - 08/15/15 1352    PT SHORT TERM GOAL #1   Title Ind with HEP.   Time 2   Period Weeks   Status Achieved           PT Long Term Goals - 11/06/15 1542    PT LONG TERM GOAL #1   Title Right hip and knee strength= 4/5 to increase stability for functional activites.   Time 4   Period Weeks   Status Achieved   PT LONG TERM GOAL #2   Title Active right knee flexion= 115-120 degrees.   Time 4   Period Weeks   Status On-going   PT LONG TERM GOAL #3   Title Decrease edema to within 2.5 cms of contralateral side to assist with range of motion gains and decrease pain.   Time 4   Period Weeks   Status On-going   PT LONG TERM GOAL #4   Title decreased pain in R knee to 4/10 or less with ADLS/ambulation   Time 4   Period Weeks   Status New   PT LONG TERM GOAL #5   Title able to amb 75 ft with [facility] with SBA in clinic   Time 4   Period Weeks   Status New   Additional Long Term Goals   Additional Long Term Goals Yes   PT LONG TERM GOAL #6   Title increased L hip ABD strength to 4/10 or better.   Time 4   Period Weeks   Status New               Plan - 11/08/15 1544    Clinical Impression Statement Patient presents today with c/o of pain in medial R knee. She states that pain has been intermittent since 10/31/15, but today is a little worse. The knee was warm to the touch and edema has increased by 3 cm since reassessment two days ago. Slight redness was also evident. PT phoned MD to report sx on behalf of patient. Awaiting return call at  time of this note. Patient tolerated some TE.  Pain in L knee was significantly better as was tenderness to palpation of L ITB.   PT Next Visit Plan F/U on R knee pain. STW to L ITB and lateral HS, gait training with LBQC, continued BLE strengthening to increase function.        Problem List Patient Active Problem List   Diagnosis Date Noted  . Pre-diabetes 08/10/2015  . Metabolic syndrome [id number]  . Malnutrition of moderate degree ([place]) 05/25/2015  . OA (osteoarthritis) of knee 07/03/2014  . HLD (hyperlipidemia) 01/04/2014  . HTN (hypertension) 01/04/2014  . Anemia, iron deficiency 12/30/2011  . Constipation 04/04/2009  . TUBULOVILLOUS ADENOMA, COLON 04/03/2009   Madelyn Flavors PT  11/08/2015, 3:53 PM  [facility] [place], [place], [address] Phone: [phone]   Fax:  [phone]  Name: Beth [name] MRN: [id number] Date of Birth: 1945-12-13

## 2015-11-09 DIAGNOSIS — Z471 Aftercare following joint replacement surgery: Secondary | ICD-10-CM | POA: Diagnosis not present

## 2015-11-09 DIAGNOSIS — Z96651 Presence of right artificial knee joint: Secondary | ICD-10-CM | POA: Diagnosis not present

## 2015-11-09 DIAGNOSIS — T814XXD Infection following a procedure, subsequent encounter: Secondary | ICD-10-CM | POA: Diagnosis not present

## 2015-11-13 ENCOUNTER — Ambulatory Visit: Payer: Medicare Other | Admitting: Physical Therapy

## 2015-11-13 DIAGNOSIS — M25662 Stiffness of left knee, not elsewhere classified: Secondary | ICD-10-CM | POA: Diagnosis not present

## 2015-11-13 DIAGNOSIS — M25562 Pain in left knee: Secondary | ICD-10-CM | POA: Diagnosis not present

## 2015-11-13 DIAGNOSIS — R531 Weakness: Secondary | ICD-10-CM | POA: Diagnosis not present

## 2015-11-13 DIAGNOSIS — M25661 Stiffness of right knee, not elsewhere classified: Secondary | ICD-10-CM | POA: Diagnosis not present

## 2015-11-13 DIAGNOSIS — M25561 Pain in right knee: Secondary | ICD-10-CM | POA: Diagnosis not present

## 2015-11-13 NOTE — Therapy (Signed)
[facility] [place], [place], [address] Phone: [phone]   Fax:  [phone]  Physical Therapy Treatment  Patient Details  Name: Beth [name] MRN: [id number] Date of Birth: Aug 02, 1946 Referring Provider: Dr. Wynelle Link  Encounter Date: 11/13/2015      PT End of Session - 11/13/15 1305    Visit Number 18   Date for PT Re-Evaluation 12/07/15   PT Start Time 1303   PT Stop Time 1408   PT Time Calculation (min) 65 min   Equipment Utilized During Treatment Gait belt   Activity Tolerance Patient tolerated treatment well   Behavior During Therapy [facility] for tasks assessed/performed      Past Medical History  Diagnosis Date  . Hypertension   . CVA (cerebral infarction)     2006  . Hyperlipidemia   . Blood transfusion without reported diagnosis 2012    anemia;pt denies transfusion stated was only on iron tablet  . Heart murmur   . Nocturia     3-4 times per night  . Gout     left elbow  . Arthritis     Knee both knees  . Anemia   . 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 08-09-14 Right eye  . Family history of anesthesia complication     sister very slow to awaken after anesthesia;severe vomiting   . Stroke ([place]) 2006    x 1 no deficits noted   . Cataract     left    Past Surgical History  Procedure Laterality Date  . Carpal tunnel release Right 1983  . Abdominal hysterectomy  1983  . Colonscopy  June 21, 2014  . Nasal cauterization  2012  . Total knee arthroplasty Right 07/03/2014    Procedure: RIGHT TOTAL KNEE ARTHROPLASTY;  Surgeon: Gearlean Alf, MD;  [facility];  Service: Orthopedics;  Laterality: Right;  . Patellar tendon repair Right 08/11/2014    Procedure: RIGHT PATELLA TENDON REPAIR;  Surgeon: Gearlean Alf, MD;  [facility];  Service: Orthopedics;  Laterality: Right;  . Joint replacement  06/2014    right knee  . I&d knee with poly exchange Right 10/02/2014    Procedure:  IRRIGATION AND DEBRIDEMENT RIGHT KNEE WITH POLY EXCHANGE;  Surgeon: Gearlean Alf, MD;  [facility];  Service: Orthopedics;  Laterality: Right;  . Excisional total knee arthroplasty with antibiotic spacers Right 02/16/2015    Procedure: RIGHT KNEE RESECTION ARTHROPLASTY WITH ANTIBIOTIC SPACERS;  Surgeon: [name], MD;  [facility];  Service: Orthopedics;  Laterality: Right;  . Reimplantation of total knee Right 05/23/2015    Procedure: RIGHT KNEE ARTHROPLASTY REIMPLANTATION;  Surgeon: [name], MD;  [facility];  Service: Orthopedics;  Laterality: Right;    There were no vitals filed for this visit.  Visit Diagnosis:  Knee stiffness, right  Generalized weakness  Knee stiffness, left      Subjective Assessment - 11/13/15 1305    Subjective Patient reports no pain today in either knee! She saw Dr. Wynelle Link after her last visit and he tried to remove fluid, but there wasn't any. She reports that he thinks it may be scar tissue breaking up and to continue with therapy.   Patient Stated Goals to be able to walk with a quad cane   Currently in Pain? No/denies            [facility] PT Assessment - 11/13/15 0001    Assessment   Medical  Diagnosis Right total knee arthroplasty revision.   Onset Date/Surgical Date 05/24/15   Next MD Visit 12/19/14   Observation/Other Assessments-Edema    Edema Circumferential   Circumferential Edema   Circumferential - Right 54 cm                     [facility] Adult PT Treatment/Exercise - 11/13/15 0001    Ambulation/Gait   Ambulation/Gait Yes   Ambulation/Gait Assistance 4: Min guard   Ambulation Distance (Feet) 75 Feet   Assistive device Large base quad cane   Gait Pattern Step-to pattern;Decreased stance time - left;Decreased stride length;Decreased step length - right   Ambulation Surface Level   Pre-Gait Activities weight shifting at counter top; max hold 40 seconds; then wt shift on to R in semitandem stance at  countertop   Gait Comments patient unable to wt shift in tandem stance without significant pressure through L toes and BUE support   Knee/Hip Exercises: Aerobic   Nustep L6 x 12 min   Modalities   Modalities Vasopneumatic   Vasopneumatic   Number Minutes Vasopneumatic  15 minutes   Vasopnuematic Location  Knee   Vasopneumatic Pressure Medium   Vasopneumatic Temperature  57                PT Education - 11/13/15 1357    Education provided Yes   Education Details weight shifting on to RLE for max hold   Person(s) Educated Patient   Methods Explanation;Demonstration   Comprehension Verbalized understanding;Returned demonstration          PT Short Term Goals - 08/15/15 1352    PT SHORT TERM GOAL #1   Title Ind with HEP.   Time 2   Period Weeks   Status Achieved           PT Long Term Goals - 11/06/15 1542    PT LONG TERM GOAL #1   Title Right hip and knee strength= 4/5 to increase stability for functional activites.   Time 4   Period Weeks   Status Achieved   PT LONG TERM GOAL #2   Title Active right knee flexion= 115-120 degrees.   Time 4   Period Weeks   Status On-going   PT LONG TERM GOAL #3   Title Decrease edema to within 2.5 cms of contralateral side to assist with range of motion gains and decrease pain.   Time 4   Period Weeks   Status On-going   PT LONG TERM GOAL #4   Title decreased pain in R knee to 4/10 or less with ADLS/ambulation   Time 4   Period Weeks   Status New   PT LONG TERM GOAL #5   Title able to amb 75 ft with [facility] with SBA in clinic   Time 4   Period Weeks   Status New   Additional Long Term Goals   Additional Long Term Goals Yes   PT LONG TERM GOAL #6   Title increased L hip ABD strength to 4/10 or better.   Time 4   Period Weeks   Status New               Plan - 11/13/15 1357    Clinical Impression Statement Patient presents today with no pain in B knees! She did well with gait training with [facility] with CGA  and one rest. She is still unable to amb with a step through gait due to weakness of RLE. No warmness  in R knee noted. Edema has decrased by 2 cm. Patella has moved superior to knee joint which MD is aware of.   PT Next Visit Plan Continue to work on pregait activities in parallel bars including wt shift with L foot on 2 inch step; semi tandem wt shift; sidestepping with band and semi squat.   Consulted and Agree with Plan of Care Patient        Problem List Patient Active Problem List   Diagnosis Date Noted  . Pre-diabetes 08/10/2015  . Metabolic syndrome [id number]  . Malnutrition of moderate degree ([place]) 05/25/2015  . OA (osteoarthritis) of knee 07/03/2014  . HLD (hyperlipidemia) 01/04/2014  . HTN (hypertension) 01/04/2014  . Anemia, iron deficiency 12/30/2011  . Constipation 04/04/2009  . TUBULOVILLOUS ADENOMA, COLON 04/03/2009   Madelyn Flavors PT  11/13/2015, 2:25 PM  [facility] [place], [place], [address] Phone: [phone]   Fax:  [phone]  Name: Beth [name] MRN: [id number] Date of Birth: [birth year]

## 2015-11-15 ENCOUNTER — Ambulatory Visit: Payer: Medicare Other | Admitting: Physical Therapy

## 2015-11-15 DIAGNOSIS — M25662 Stiffness of left knee, not elsewhere classified: Secondary | ICD-10-CM | POA: Diagnosis not present

## 2015-11-15 DIAGNOSIS — M25661 Stiffness of right knee, not elsewhere classified: Secondary | ICD-10-CM | POA: Diagnosis not present

## 2015-11-15 DIAGNOSIS — M25561 Pain in right knee: Secondary | ICD-10-CM | POA: Diagnosis not present

## 2015-11-15 DIAGNOSIS — R531 Weakness: Secondary | ICD-10-CM | POA: Diagnosis not present

## 2015-11-15 DIAGNOSIS — M25562 Pain in left knee: Secondary | ICD-10-CM | POA: Diagnosis not present

## 2015-11-15 NOTE — Therapy (Signed)
[facility] [place], [place], [address] Phone: [phone]   Fax:  [phone]  Physical Therapy Treatment  Patient Details  Name: Beth [name] MRN: [id number] Date of Birth: 11-03-1946 Referring Provider: Dr. Wynelle Link  Encounter Date: 11/15/2015      PT End of Session - 11/15/15 1626    Visit Number 19   Number of Visits 23   Date for PT Re-Evaluation 12/07/15   PT Start Time 0315   PT Stop Time 0407   PT Time Calculation (min) 52 min   Activity Tolerance Patient tolerated treatment well   Behavior During Therapy [facility] for tasks assessed/performed      Past Medical History  Diagnosis Date  . Hypertension   . CVA (cerebral infarction)     2006  . Hyperlipidemia   . Blood transfusion without reported diagnosis 2012    anemia;pt denies transfusion stated was only on iron tablet  . Heart murmur   . Nocturia     3-4 times per night  . Gout     left elbow  . Arthritis     Knee both knees  . Anemia   . 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 08-09-14 Right eye  . Family history of anesthesia complication     sister very slow to awaken after anesthesia;severe vomiting   . Stroke ([place]) 2006    x 1 no deficits noted   . Cataract     left    Past Surgical History  Procedure Laterality Date  . Carpal tunnel release Right 1983  . Abdominal hysterectomy  1983  . Colonscopy  June 21, 2014  . Nasal cauterization  2012  . Total knee arthroplasty Right 07/03/2014    Procedure: RIGHT TOTAL KNEE ARTHROPLASTY;  Surgeon: Gearlean Alf, MD;  Location: [facility];  Service: Orthopedics;  Laterality: Right;  . Patellar tendon repair Right 08/11/2014    Procedure: RIGHT PATELLA TENDON REPAIR;  Surgeon: Gearlean Alf, MD;  Location: [facility];  Service: Orthopedics;  Laterality: Right;  . Joint replacement  06/2014    right knee  . I&d knee with poly exchange Right 10/02/2014    Procedure: IRRIGATION AND DEBRIDEMENT RIGHT  KNEE WITH POLY EXCHANGE;  Surgeon: Gearlean Alf, MD;  Location: [facility];  Service: Orthopedics;  Laterality: Right;  . Excisional total knee arthroplasty with antibiotic spacers Right 02/16/2015    Procedure: RIGHT KNEE RESECTION ARTHROPLASTY WITH ANTIBIOTIC SPACERS;  Surgeon: [name], MD;  Location: [facility];  Service: Orthopedics;  Laterality: Right;  . Reimplantation of total knee Right 05/23/2015    Procedure: RIGHT KNEE ARTHROPLASTY REIMPLANTATION;  Surgeon: [name], MD;  Location: [facility];  Service: Orthopedics;  Laterality: Right;    There were no vitals filed for this visit.  Visit Diagnosis:  Knee stiffness, right  Generalized weakness  Knee stiffness, left  Right knee pain  Lateral knee pain, left      Subjective Assessment - 11/15/15 1614    Subjective Dr. Junie [name] get fluid out of my knee.   Patient Stated Goals to be able to walk with a quad cane   Pain Score 6    Pain Location Knee   Pain Orientation Left   Pain Descriptors / Indicators Sharp   Pain Type Chronic pain   Pain Onset More than a month ago  [facility] Adult PT Treatment/Exercise - 11/15/15 0001    Exercises   Exercises Knee/Hip;Ankle   Knee/Hip Exercises: Aerobic   Nustep L5 x 15 minutes   Vasopneumatic   Number Minutes Vasopneumatic  15 minutes   Vasopnuematic Location  --  Right knee.   Vasopneumatic Pressure Medium   Manual Therapy   Manual therapy comments IASTM to patient's right knee while performing a sustained extension stretch x 10 minutes.   Ankle Exercises: Standing   Other Standing Ankle Exercises Rockerboard x 3 minutes.                  PT Short Term Goals - 08/15/15 1352    PT SHORT TERM GOAL #1   Title Ind with HEP.   Time 2   Period Weeks   Status Achieved           PT Long Term Goals - 11/06/15 1542    PT LONG TERM GOAL #1   Title Right hip and knee strength= 4/5 to increase stability for functional  activites.   Time 4   Period Weeks   Status Achieved   PT LONG TERM GOAL #2   Title Active right knee flexion= 115-120 degrees.   Time 4   Period Weeks   Status On-going   PT LONG TERM GOAL #3   Title Decrease edema to within 2.5 cms of contralateral side to assist with range of motion gains and decrease pain.   Time 4   Period Weeks   Status On-going   PT LONG TERM GOAL #4   Title decreased pain in R knee to 4/10 or less with ADLS/ambulation   Time 4   Period Weeks   Status New   PT LONG TERM GOAL #5   Title able to amb 75 ft with [facility] with SBA in clinic   Time 4   Period Weeks   Status New   Additional Long Term Goals   Additional Long Term Goals Yes   PT LONG TERM GOAL #6   Title increased L hip ABD strength to 4/10 or better.   Time 4   Period Weeks   Status New               Problem List Patient Active Problem List   Diagnosis Date Noted  . Pre-diabetes 08/10/2015  . Metabolic syndrome [id number]  . Malnutrition of moderate degree ([place]) 05/25/2015  . OA (osteoarthritis) of knee 07/03/2014  . HLD (hyperlipidemia) 01/04/2014  . HTN (hypertension) 01/04/2014  . Anemia, iron deficiency 12/30/2011  . Constipation 04/04/2009  . TUBULOVILLOUS ADENOMA, COLON 04/03/2009    Tasnim Balentine, [place] MPT 11/15/2015, 4:47 PM  [facility] [address] [place], [place], [address] Phone: [phone]   Fax:  [phone]  Name: Beth [name] MRN: [id number] Date of Birth: Mar 22, 1946

## 2015-11-19 ENCOUNTER — Ambulatory Visit (INDEPENDENT_AMBULATORY_CARE_PROVIDER_SITE_OTHER): Payer: Medicare Other | Admitting: Family Medicine

## 2015-11-19 ENCOUNTER — Encounter: Payer: Self-pay | Admitting: Family Medicine

## 2015-11-19 VITALS — BP 152/90 | HR 70 | Temp 96.7°F | Ht 65.0 in | Wt 198.0 lb

## 2015-11-19 DIAGNOSIS — R7303 Prediabetes: Secondary | ICD-10-CM

## 2015-11-19 DIAGNOSIS — M1711 Unilateral primary osteoarthritis, right knee: Secondary | ICD-10-CM | POA: Diagnosis not present

## 2015-11-19 DIAGNOSIS — Z23 Encounter for immunization: Secondary | ICD-10-CM

## 2015-11-19 DIAGNOSIS — E785 Hyperlipidemia, unspecified: Secondary | ICD-10-CM | POA: Diagnosis not present

## 2015-11-19 DIAGNOSIS — I10 Essential (primary) hypertension: Secondary | ICD-10-CM

## 2015-11-19 DIAGNOSIS — R32 Unspecified urinary incontinence: Secondary | ICD-10-CM | POA: Diagnosis not present

## 2015-11-19 LAB — POCT GLYCOSYLATED HEMOGLOBIN (HGB A1C): HEMOGLOBIN A1C: 6.3

## 2015-11-19 MED ORDER — OLMESARTAN MEDOXOMIL 40 MG PO TABS: 40.0000 mg | ORAL_TABLET | Freq: Every day | ORAL | Status: DC

## 2015-11-19 MED ORDER — HYDRALAZINE HCL 50 MG PO TABS: 50.0000 mg | ORAL_TABLET | Freq: Two times a day (BID) | ORAL | Status: DC

## 2015-11-19 NOTE — Progress Notes (Signed)
Subjective:  Patient ID: Beth [name], female    DOB: 07-25-1946  Age: 69 y.o. MRN: [id number]  CC: Diabetes; Hyperlipidemia; and Hypertension   HPI Beth [name] presents for  follow-up of hypertension. Patient has no history of headache chest pain or shortness of breath or recent cough. Patient also denies symptoms of TIA such as numbness weakness lateralizing. Patient checks  blood pressure at home and has not had any elevated readings recently. Patient denies side effects from his medication. States taking it regularly.  Patient also  in for follow-up of elevated cholesterol. Doing well without complaints on current medication. Denies side effects of statin including myalgia and arthralgia and nausea. Also in today for liver function testing. Currently no chest pain, shortness of breath or other cardiovascular related symptoms noted.  Follow-up of diabetes. Patient does check blood sugar at home. Readings run between 120 and 136 fasting.  Patient denies symptoms such as polyuria, polydipsia, excessive hunger, nausea No significant hypoglycemic spells noted. Medications as noted below. Taking them regularly without complication/adverse reaction being reported today.   Pt. Having to get up to bathroom  4 times a night. No dysuria. Onset 6 mos. Ago.  Taking the benicar HCT in the morning. Denies daytime incontinence.  History Beth [name] has a past medical history of Hypertension; CVA (cerebral infarction); Hyperlipidemia; Blood transfusion without reported diagnosis (2012); Heart murmur; Nocturia; Gout; Arthritis; Anemia; Herpes infection (08-09-14); Family history of anesthesia complication; Stroke [facility]) (2006); and Cataract.   She has past surgical history that includes Carpal tunnel release (Right, 1983); Abdominal hysterectomy (1983); colonscopy (June 21, 2014); nasal cauterization (2012); Total knee arthroplasty (Right, 07/03/2014); Patellar tendon repair (Right, 08/11/2014); Joint replacement  (06/2014); I&D knee with poly exchange (Right, 10/02/2014); Excisional total knee arthroplasty with antibiotic spacers (Right, 02/16/2015); and Reimplantation of total knee (Right, 05/23/2015).   Her family history includes Diabetes in her son and son; Heart disease in her brother; Hypertension in her father; Kidney disease in her daughter; Ovarian cancer in her mother; Peripheral vascular disease in her father. There is no history of Colon cancer, Esophageal cancer, Stomach cancer, or Rectal cancer.She reports that she has never smoked. She has never used smokeless tobacco. She reports that she does not drink alcohol or use illicit drugs.  Current Outpatient Prescriptions on File Prior to Visit  Medication Sig Dispense Refill  . amLODipine (NORVASC) 10 MG tablet Take 1 tablet (10 mg total) by mouth every evening. 90 tablet 1  . atorvastatin (LIPITOR) 20 MG tablet Take 1 tablet (20 mg total) by mouth every evening. 90 tablet 1  . blood glucose meter kit and supplies KIT Dispense based on patient and insurance preference. Use up to four times daily as directed. (FOR ICD-9 250.00, 250.01). 1 each 0  . ferrous fumarate (HEMOCYTE - 106 MG FE) 325 (106 FE) MG TABS tablet Take 1 tablet (106 mg of iron total) by mouth 2 (two) times daily. 60 each 5  . metFORMIN (GLUCOPHAGE-XR) 500 MG 24 hr tablet Take 1 tablet (500 mg total) by mouth daily with breakfast. 90 tablet 0  . Nebivolol HCl (BYSTOLIC) 20 MG TABS Take 1 tablet (20 mg total) by mouth every evening. 90 tablet 1  . ONE TOUCH ULTRA TEST test strip Test blood sugar 1 time a day 100 each 1  . ONETOUCH DELICA LANCETS 02R MISC      Current Facility-Administered Medications on File Prior to Visit  Medication Dose Route Frequency Provider Last Rate Last Dose  .  tranexamic acid (CYKLOKAPRON) 2,000 mg in sodium chloride 0.9 % 50 mL Topical Application  6,789 mg Topical Once [name], MD        ROS Review of Systems  Constitutional: Negative for fever,  activity change and appetite change.  HENT: Negative for congestion, rhinorrhea and sore throat.   Eyes: Negative for visual disturbance.  Respiratory: Negative for cough and shortness of breath.   Cardiovascular: Negative for chest pain and palpitations.  Gastrointestinal: Negative for nausea, abdominal pain and diarrhea.  Genitourinary: Negative for dysuria.  Musculoskeletal: Positive for arthralgias (knees- at baseline, getting better with exercise regimen). Negative for myalgias.    Objective:  BP 152/90 mmHg  Pulse 70  Temp(Src) 96.7 F (35.9 C) (Oral)  Ht 5' 5"  (1.651 m)  Wt 198 lb (89.812 kg)  BMI 32.95 kg/m2  SpO2 96%  BP Readings from Last 3 Encounters:  11/19/15 152/90  10/03/15 148/87  08/16/15 188/86    Wt Readings from Last 3 Encounters:  11/19/15 198 lb (89.812 kg)  08/16/15 196 lb (88.905 kg)  08/10/15 200 lb 8 oz (90.946 kg)     Physical Exam  Constitutional: She is oriented to person, place, and time. She appears well-developed and well-nourished. No distress.  HENT:  Head: Normocephalic and atraumatic.  Right Ear: External ear normal.  Left Ear: External ear normal.  Nose: Nose normal.  Mouth/Throat: Oropharynx is clear and moist.  Eyes: Conjunctivae and EOM are normal. Pupils are equal, round, and reactive to light.  Neck: Normal range of motion. Neck supple. No thyromegaly present.  Cardiovascular: Normal rate, regular rhythm and normal heart sounds.   No murmur heard. Pulmonary/Chest: Effort normal and breath sounds normal. No respiratory distress. She has no wheezes. She has no rales.  Abdominal: Soft. Bowel sounds are normal. She exhibits no distension. There is no tenderness.  Lymphadenopathy:    She has no cervical adenopathy.  Neurological: She is alert and oriented to person, place, and time. She has normal reflexes.  Skin: Skin is warm and dry.  Psychiatric: She has a normal mood and affect. Her behavior is normal. Judgment and thought  content normal.    Lab Results  Component Value Date   HGBA1C 6.3 08/10/2015   HGBA1C 6.1 06/21/2013      No results found.  Assessment & Plan:   Beth [name] was seen today for diabetes, hyperlipidemia and hypertension.  Diagnoses and all orders for this visit:  Pre-diabetes -     hydrALAZINE (APRESOLINE) 50 MG tablet; Take 1 tablet (50 mg total) by mouth 2 (two) times daily before a meal. -     POCT glycosylated hemoglobin (Hb A1C)  Encounter for immunization  Essential hypertension -     olmesartan (BENICAR) 40 MG tablet; Take 1 tablet (40 mg total) by mouth daily. -     hydrALAZINE (APRESOLINE) 50 MG tablet; Take 1 tablet (50 mg total) by mouth 2 (two) times daily before a meal. -     CMP14+EGFR -     Microalbumin / creatinine urine ratio -     POCT urinalysis dipstick  HLD (hyperlipidemia)  Primary osteoarthritis of right knee  Enuresis  Other orders -     Flu Vaccine QUAD 36+ mos IM   I have discontinued Beth [name]'s olmesartan-hydrochlorothiazide, methocarbamol, polyethylene glycol, oxyCODONE, and fexofenadine. I am also having her start on olmesartan and hydrALAZINE. Additionally, I am having her maintain her Nebivolol HCl, blood glucose meter kit and supplies, ONETOUCH DELICA LANCETS 38B, ferrous  fumarate, atorvastatin, amLODipine, ONE TOUCH ULTRA TEST, and metFORMIN.  Meds ordered this encounter  Medications  . olmesartan (BENICAR) 40 MG tablet    Sig: Take 1 tablet (40 mg total) by mouth daily.    Dispense:  30 tablet    Refill:  5  . hydrALAZINE (APRESOLINE) 50 MG tablet    Sig: Take 1 tablet (50 mg total) by mouth 2 (two) times daily before a meal.    Dispense:  60 tablet    Refill:  3     Follow-up: No Follow-up on file.  Claretta Fraise, M.D.

## 2015-11-20 LAB — CMP14+EGFR
A/G RATIO: 1.1 (ref 1.1–2.5)
ALK PHOS: 125 IU/L — AB (ref 39–117)
ALT: 18 IU/L (ref 0–32)
AST: 25 IU/L (ref 0–40)
Albumin: 4 g/dL (ref 3.5–4.8)
BUN/Creatinine Ratio: 26 (ref 11–26)
BUN: 23 mg/dL (ref 8–27)
Bilirubin Total: 0.7 mg/dL (ref 0.0–1.2)
CALCIUM: 9.9 mg/dL (ref 8.7–10.3)
CHLORIDE: 97 mmol/L (ref 96–106)
CO2: 25 mmol/L (ref 18–29)
Creatinine, Ser: 0.87 mg/dL (ref 0.57–1.00)
GFR calc Af Amer: 78 mL/min/{1.73_m2} (ref >59)
GFR calc non Af Amer: 67 mL/min/{1.73_m2} (ref >59)
Globulin, Total: 3.8 g/dL (ref 1.5–4.5)
Glucose: 90 mg/dL (ref 65–99)
Potassium: 3.6 mmol/L (ref 3.5–5.2)
Sodium: 139 mmol/L (ref 134–144)
Total Protein: 7.8 g/dL (ref 6.0–8.5)

## 2015-11-20 LAB — MICROALBUMIN / CREATININE URINE RATIO
CREATININE, UR: 150.4 mg/dL
MICROALB/CREAT RATIO: 191 mg/g{creat} — AB (ref 0.0–30.0)
Microalbumin, Urine: 287.2 ug/mL

## 2015-11-20 NOTE — Progress Notes (Signed)
Patient aware.

## 2015-11-23 ENCOUNTER — Encounter: Payer: Medicare Other | Admitting: Physical Therapy

## 2015-11-23 ENCOUNTER — Telehealth: Payer: Self-pay | Admitting: Family Medicine

## 2015-11-23 MED ORDER — ONETOUCH ULTRA BLUE VI STRP: ORAL_STRIP | Status: DC

## 2015-11-23 NOTE — Telephone Encounter (Signed)
done

## 2015-11-27 ENCOUNTER — Ambulatory Visit: Payer: Medicare Other | Admitting: *Deleted

## 2015-11-27 DIAGNOSIS — M25561 Pain in right knee: Secondary | ICD-10-CM | POA: Diagnosis not present

## 2015-11-27 DIAGNOSIS — R531 Weakness: Secondary | ICD-10-CM

## 2015-11-27 DIAGNOSIS — M25562 Pain in left knee: Secondary | ICD-10-CM

## 2015-11-27 DIAGNOSIS — M25662 Stiffness of left knee, not elsewhere classified: Secondary | ICD-10-CM | POA: Diagnosis not present

## 2015-11-27 DIAGNOSIS — M25661 Stiffness of right knee, not elsewhere classified: Secondary | ICD-10-CM

## 2015-11-27 NOTE — Therapy (Signed)
[facility] [place], [place], [address] Phone: [phone]   Fax:  [phone]  Physical Therapy Treatment  Patient Details  Name: Beth [name] MRN: [id number] Date of Birth: [birth year] Referring Provider: Dr. Wynelle Link  Encounter Date: 11/27/2015      PT End of Session - 11/27/15 1446    Visit Number 20   Number of Visits 23   Date for PT Re-Evaluation 12/07/15   PT Start Time 6644   PT Stop Time 1433   PT Time Calculation (min) 48 min      Past Medical History  Diagnosis Date  . Hypertension   . CVA (cerebral infarction)     2006  . Hyperlipidemia   . Blood transfusion without reported diagnosis 2012    anemia;pt denies transfusion stated was only on iron tablet  . Heart murmur   . Nocturia     3-4 times per night  . Gout     left elbow  . Arthritis     Knee both knees  . Anemia   . 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 08-09-14 Right eye  . Family history of anesthesia complication     sister very slow to awaken after anesthesia;severe vomiting   . Stroke ([place]) 2006    x 1 no deficits noted   . Cataract     left    Past Surgical History  Procedure Laterality Date  . Carpal tunnel release Right 1983  . Abdominal hysterectomy  1983  . Colonscopy  June 21, 2014  . Nasal cauterization  2012  . Total knee arthroplasty Right 07/03/2014    Procedure: RIGHT TOTAL KNEE ARTHROPLASTY;  Surgeon: Gearlean Alf, MD;  [facility]: [facility];  Service: Orthopedics;  Laterality: Right;  . Patellar tendon repair Right 08/11/2014    Procedure: RIGHT PATELLA TENDON REPAIR;  Surgeon: Gearlean Alf, MD;  Location: [facility];  Service: Orthopedics;  Laterality: Right;  . Joint replacement  06/2014    right knee  . I&d knee with poly exchange Right 10/02/2014    Procedure: IRRIGATION AND DEBRIDEMENT RIGHT KNEE WITH POLY EXCHANGE;  Surgeon: Gearlean Alf, MD;  [facility]: [facility];  Service: Orthopedics;  Laterality:  Right;  . Excisional total knee arthroplasty with antibiotic spacers Right 02/16/2015    Procedure: RIGHT KNEE RESECTION ARTHROPLASTY WITH ANTIBIOTIC SPACERS;  Surgeon: [name], MD;  [facility];  Service: Orthopedics;  Laterality: Right;  . Reimplantation of total knee Right 05/23/2015    Procedure: RIGHT KNEE ARTHROPLASTY REIMPLANTATION;  Surgeon: [name], MD;  [facility]: [facility];  Service: Orthopedics;  Laterality: Right;    There were no vitals filed for this visit.  Visit Diagnosis:  Knee stiffness, right  Generalized weakness  Knee stiffness, left  Right knee pain  Lateral knee pain, left      Subjective Assessment - 11/27/15 1359    Subjective Dr. Junie [name] get fluid out of my knee.RT knee is getting bigger and more painful. I need to call MD about pain meds now.   Patient Stated Goals to be able to walk with a quad cane   Currently in Pain? Yes   Pain Score 6    Pain Orientation Left   Pain Descriptors / Indicators Constant;Sore;Tightness   Pain Type Chronic pain   Pain Onset More than a month ago   Pain Frequency Constant   Aggravating Factors  THE SWELLING   Pain Relieving Factors biofreeze and ice  [facility] Adult PT Treatment/Exercise - 11/27/15 0001    Exercises   Exercises Knee/Hip;Ankle   Knee/Hip Exercises: Aerobic   Nustep L5 x 15 minutes   Vasopneumatic   Number Minutes Vasopneumatic  15 minutes   Vasopnuematic Location  Knee   Vasopneumatic Pressure Medium   Vasopneumatic Temperature  36   Manual Therapy   Manual therapy comments IASTM to patient's right knee to help decrease swelling while performing a sustained extension stretch x 10 minutes.                  PT Short Term Goals - 08/15/15 1352    PT SHORT TERM GOAL #1   Title Ind with HEP.   Time 2   Period Weeks   Status Achieved           PT Long Term Goals - 11/06/15 1542    PT LONG TERM GOAL #1   Title Right hip and knee  strength= 4/5 to increase stability for functional activites.   Time 4   Period Weeks   Status Achieved   PT LONG TERM GOAL #2   Title Active right knee flexion= 115-120 degrees.   Time 4   Period Weeks   Status On-going   PT LONG TERM GOAL #3   Title Decrease edema to within 2.5 cms of contralateral side to assist with range of motion gains and decrease pain.   Time 4   Period Weeks   Status On-going   PT LONG TERM GOAL #4   Title decreased pain in R knee to 4/10 or less with ADLS/ambulation   Time 4   Period Weeks   Status New   PT LONG TERM GOAL #5   Title able to amb 75 ft with [facility] with SBA in clinic   Time 4   Period Weeks   Status New   Additional Long Term Goals   Additional Long Term Goals Yes   PT LONG TERM GOAL #6   Title increased L hip ABD strength to 4/10 or better.   Time 4   Period Weeks   Status New               Plan - 11/27/15 1453    Clinical Impression Statement Pt did fair today, but is very concerned about the swelling in her RT knee. Today her RT knee was 54.5 cm compared to LT knee 47 cm circumference measurements. No new goals Met today due to increased Edema RT knee   Pt will benefit from skilled therapeutic intervention in order to improve on the following deficits Pain;Decreased activity tolerance;Increased edema;Decreased strength;Decreased range of motion   Rehab Potential Good   Clinical Impairments Affecting Rehab Potential mulitiple knee surgeries   PT Frequency 2x / week   PT Duration 4 weeks   PT Treatment/Interventions Vasopneumatic Device;Cryotherapy;Therapeutic activities;Therapeutic exercise;Neuromuscular re-education;Patient/family education;Manual techniques;Electrical Stimulation;ADLs/Self Care Home Management;Gait training   PT Next Visit Plan Continue to work on pregait activities in parallel bars including wt shift with L foot on 2 inch step; semi tandem wt shift; sidestepping with band and semi squat.  Decrease swelling    PT Home Exercise Plan ITB and HS stretch   Consulted and Agree with Plan of Care Patient  20th Visit Gcode. clinical judgement     G-Codes - December 10, 2015 1735    Functional Assessment Tool Used Clinical judgement.   Functional Limitation Mobility: Walking and moving around   Mobility: Walking and Moving Around Current Status [phone]) At least 60 percent but less than 80 percent impaired, limited or restricted   Mobility: Walking and Moving Around Goal Status [phone]) At least 40 percent but less than 60 percent impaired, limited or restricted      Problem List Patient Active Problem List   Diagnosis Date Noted  . Enuresis 11/19/2015  . Pre-diabetes 08/10/2015  . Metabolic syndrome 15/03/1363  . Malnutrition of moderate degree ([place]) 05/25/2015  . OA (osteoarthritis) of knee 07/03/2014  . HLD (hyperlipidemia) 01/04/2014  . HTN (hypertension) 01/04/2014  . Anemia, iron deficiency 12/30/2011  . Constipation 04/04/2009  . TUBULOVILLOUS ADENOMA, COLON 04/03/2009    APPLEGATE, [place], PTA 2015-12-10, 5:35 PM [place]i Applegate MPT [facility] [address] [place], [place], [address] Phone: [phone]   Fax:  [phone]  Name: GAILYA TAUER MRN: [id number] Date of Birth: [birth year]

## 2015-11-29 ENCOUNTER — Encounter: Payer: Medicare Other | Admitting: *Deleted

## 2015-11-29 DIAGNOSIS — T814XXD Infection following a procedure, subsequent encounter: Secondary | ICD-10-CM | POA: Diagnosis not present

## 2015-11-29 DIAGNOSIS — Z79899 Other long term (current) drug therapy: Secondary | ICD-10-CM | POA: Diagnosis not present

## 2015-11-29 DIAGNOSIS — Z96651 Presence of right artificial knee joint: Secondary | ICD-10-CM | POA: Diagnosis not present

## 2015-12-06 DIAGNOSIS — Z471 Aftercare following joint replacement surgery: Secondary | ICD-10-CM | POA: Diagnosis not present

## 2015-12-06 DIAGNOSIS — Z96651 Presence of right artificial knee joint: Secondary | ICD-10-CM | POA: Diagnosis not present

## 2015-12-06 DIAGNOSIS — T814XXD Infection following a procedure, subsequent encounter: Secondary | ICD-10-CM | POA: Diagnosis not present

## 2015-12-20 DIAGNOSIS — Z96651 Presence of right artificial knee joint: Secondary | ICD-10-CM | POA: Diagnosis not present

## 2015-12-20 DIAGNOSIS — Z471 Aftercare following joint replacement surgery: Secondary | ICD-10-CM | POA: Diagnosis not present

## 2015-12-24 DIAGNOSIS — Z1231 Encounter for screening mammogram for malignant neoplasm of breast: Secondary | ICD-10-CM | POA: Diagnosis not present

## 2015-12-24 LAB — HM MAMMOGRAPHY: HM Mammogram: NEGATIVE

## 2016-01-02 DIAGNOSIS — Z471 Aftercare following joint replacement surgery: Secondary | ICD-10-CM | POA: Diagnosis not present

## 2016-01-02 DIAGNOSIS — Z96651 Presence of right artificial knee joint: Secondary | ICD-10-CM | POA: Diagnosis not present

## 2016-01-08 DIAGNOSIS — Z0000 Encounter for general adult medical examination without abnormal findings: Secondary | ICD-10-CM | POA: Diagnosis not present

## 2016-01-08 DIAGNOSIS — R350 Frequency of micturition: Secondary | ICD-10-CM | POA: Diagnosis not present

## 2016-01-08 DIAGNOSIS — R351 Nocturia: Secondary | ICD-10-CM | POA: Diagnosis not present

## 2016-01-11 ENCOUNTER — Encounter: Payer: Self-pay | Admitting: *Deleted

## 2016-01-17 DIAGNOSIS — M25562 Pain in left knee: Secondary | ICD-10-CM | POA: Diagnosis not present

## 2016-01-17 DIAGNOSIS — M1712 Unilateral primary osteoarthritis, left knee: Secondary | ICD-10-CM | POA: Diagnosis not present

## 2016-01-17 DIAGNOSIS — Z96651 Presence of right artificial knee joint: Secondary | ICD-10-CM | POA: Diagnosis not present

## 2016-01-17 DIAGNOSIS — Z471 Aftercare following joint replacement surgery: Secondary | ICD-10-CM | POA: Diagnosis not present

## 2016-01-23 DIAGNOSIS — H25813 Combined forms of age-related cataract, bilateral: Secondary | ICD-10-CM | POA: Diagnosis not present

## 2016-01-23 DIAGNOSIS — S0501XA Injury of conjunctiva and corneal abrasion without foreign body, right eye, initial encounter: Secondary | ICD-10-CM | POA: Diagnosis not present

## 2016-01-24 DIAGNOSIS — S0501XD Injury of conjunctiva and corneal abrasion without foreign body, right eye, subsequent encounter: Secondary | ICD-10-CM | POA: Diagnosis not present

## 2016-01-29 ENCOUNTER — Ambulatory Visit: Payer: Medicare Other | Admitting: Physical Therapy

## 2016-01-30 DIAGNOSIS — S0501XD Injury of conjunctiva and corneal abrasion without foreign body, right eye, subsequent encounter: Secondary | ICD-10-CM | POA: Diagnosis not present

## 2016-02-12 ENCOUNTER — Encounter: Payer: Self-pay | Admitting: Physical Therapy

## 2016-02-12 ENCOUNTER — Ambulatory Visit: Payer: Medicare Other | Attending: Orthopedic Surgery | Admitting: Physical Therapy

## 2016-02-12 DIAGNOSIS — R269 Unspecified abnormalities of gait and mobility: Secondary | ICD-10-CM | POA: Diagnosis not present

## 2016-02-12 DIAGNOSIS — R29898 Other symptoms and signs involving the musculoskeletal system: Secondary | ICD-10-CM | POA: Insufficient documentation

## 2016-02-12 DIAGNOSIS — M25562 Pain in left knee: Secondary | ICD-10-CM | POA: Insufficient documentation

## 2016-02-12 NOTE — Therapy (Signed)
[facility] [place], [place], [address] Phone: [phone]   Fax:  [phone]  Physical Therapy Evaluation  Patient Details  Name: Beth [name] MRN: [id number] Date of Birth: 25-Nov-1946 Referring Provider: [name] MD  Encounter Date: 02/12/2016      PT End of Session - 02/12/16 1404    Visit Number 1   Number of Visits 8   Date for PT Re-Evaluation 03/25/16   PT Start Time 1400   PT Stop Time 1436   PT Time Calculation (min) 36 min   Activity Tolerance Patient tolerated treatment well   Behavior During Therapy [facility] for tasks assessed/performed      Past Medical History  Diagnosis Date  . Hypertension   . CVA (cerebral infarction)     2006  . Hyperlipidemia   . Blood transfusion without reported diagnosis 2012    anemia;pt denies transfusion stated was only on iron tablet  . Heart murmur   . Nocturia     3-4 times per night  . Gout     left elbow  . Arthritis     Knee both knees  . Anemia   . 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 08-09-14 Right eye  . Family history of anesthesia complication     sister very slow to awaken after anesthesia;severe vomiting   . Stroke ([place]) 2006    x 1 no deficits noted   . Cataract     left    Past Surgical History  Procedure Laterality Date  . Carpal tunnel release Right 1983  . Abdominal hysterectomy  1983  . Colonscopy  June 21, 2014  . Nasal cauterization  2012  . Total knee arthroplasty Right 07/03/2014    Procedure: RIGHT TOTAL KNEE ARTHROPLASTY;  Surgeon: Gearlean Alf, MD;  Location: [facility];  Service: Orthopedics;  Laterality: Right;  . Patellar tendon repair Right 08/11/2014    Procedure: RIGHT PATELLA TENDON REPAIR;  Surgeon: Gearlean Alf, MD;  [facility]: [facility];  Service: Orthopedics;  Laterality: Right;  . Joint replacement  06/2014    right knee  . I&d knee with poly exchange Right 10/02/2014    Procedure: IRRIGATION AND DEBRIDEMENT RIGHT  KNEE WITH POLY EXCHANGE;  Surgeon: Gearlean Alf, MD;  [facility];  Service: Orthopedics;  Laterality: Right;  . Excisional total knee arthroplasty with antibiotic spacers Right 02/16/2015    Procedure: RIGHT KNEE RESECTION ARTHROPLASTY WITH ANTIBIOTIC SPACERS;  Surgeon: [name], MD;  [facility];  Service: Orthopedics;  Laterality: Right;  . Reimplantation of total knee Right 05/23/2015    Procedure: RIGHT KNEE ARTHROPLASTY REIMPLANTATION;  Surgeon: [name], MD;  [facility];  Service: Orthopedics;  Laterality: Right;    There were no vitals filed for this visit.  Visit Diagnosis:  Weakness of left lower extremity - Plan: PT plan of care cert/re-cert  Left knee pain - Plan: PT plan of care cert/re-cert  Abnormality of gait - Plan: PT plan of care cert/re-cert      Subjective Assessment - 02/12/16 1406    Subjective Patient reports she has some pain in the L knee, but mainly it is weak. She had a cortisone injection in February which helped some.   Patient Stated Goals to be able to walk with a quad cane   Currently in Pain? Yes   Pain Score 5    Pain Location Knee   Pain Orientation Right   Pain  Descriptors / Indicators Sharp   Pain Type Chronic pain   Pain Onset More than a month ago   Pain Frequency Constant   Aggravating Factors  sleeping   Pain Relieving Factors sleeping on R side with pillow between legs   Effect of Pain on Daily Activities can't sleep            [facility] PT Assessment - 02/12/16 0001    Assessment   Medical Diagnosis chronic Left knee pain   Referring Provider [name] MD   Onset Date/Surgical Date 02/12/15   Next MD Visit end of march   Precautions   Precautions Fall   Precaution Comments FALL RISK   Balance Screen   Has the patient fallen in the past 6 months No   Has the patient had a decrease in activity level because of a fear of falling?  No   Is the patient reluctant to leave their home because of a fear of  falling?  No   Home Ecologist residence   [place] - 2 wheels   Prior Function   Level of [place] Retired   ROM / Strength   AROM / PROM / Strength Strength   AROM   Right/Left Knee Left   Left Knee Extension -8   Left Knee Flexion 88   PROM   Right/Left Knee Left   Left Knee Extension -8   Left Knee Flexion 95   Strength   Strength Assessment Site Hip;Knee   Right/Left Hip Left   Left Hip Flexion 4+/5   Left Hip Extension 4+/5   Left Hip ABduction 4+/5  sitting   Left Hip ADduction 5/5  sitting   Right/Left Knee Left   Left Knee Flexion 4/5   Left Knee Extension 5/5   Palpation   Patella mobility WNL   Palpation comment tender at L medial joint line and just distal, distal hip adductors, post knee   Ambulation/Gait   Ambulation Distance (Feet) 60 Feet   Assistive device Rolling walker   Gait Pattern Step-to pattern;Decreased hip/knee flexion - left;Left hip hike;Lateral trunk lean to right;Poor foot clearance - left   Ambulation Surface Level                   [facility] Adult PT Treatment/Exercise - 02/12/16 0001    Modalities   Modalities Ultrasound   Ultrasound   Ultrasound Location Lmedial knee   Ultrasound Parameters 1.5 Wcm2 1 mhz cont x 8 min    Ultrasound Goals Pain                PT Education - 02/12/16 1448    Education provided Yes   Education Details HEP   Person(s) Educated Patient   Methods Explanation;Demonstration   Comprehension Verbalized understanding;Returned demonstration             PT Long Term Goals - 02/12/16 1532    PT LONG TERM GOAL #1   Title Improved L knee flexion to allow foot clearance in swing phase of gait.   Time 4   Period Weeks   Status New   PT LONG TERM GOAL #2   Title decreased L knee pain to 2-3/10 or less with activity.   Time 4   Period Weeks   Status New               Plan -  02/12/16 1448  Clinical Impression Statement Patient presents today with complaints of difficulty walking due to L knee weakness prohibiting her from clearing foot in swing phase. She also reports pain with knee flexion.   Pt will benefit from skilled therapeutic intervention in order to improve on the following deficits Pain;Decreased activity tolerance;Decreased strength;Decreased range of motion;Abnormal gait   Rehab Potential Good   Clinical Impairments Affecting Rehab Potential mulitiple knee surgeries   PT Frequency 2x / week   PT Duration 4 weeks   PT Treatment/Interventions Cryotherapy;Air traffic controller;Therapeutic exercise;Manual techniques;Patient/family education;Neuromuscular re-education;Passive range of motion;Dry needling;Taping   PT Next Visit Plan Left knee HS strengthening; Assess [place], cont [place] to medial knee, STW to med knee and adductors and post knee, Work on ROM for flexion and extension; gait training   PT Home Exercise Plan active knee flex/ext   Consulted and Agree with Plan of Care Patient          G-Codes - 03/07/16 1530    Functional Assessment Tool Used FOTO 64% LIMITED   Functional Limitation Mobility: Walking and moving around   Mobility: Walking and Moving Around Current Status [id number]) At least 60 percent but less than 80 percent impaired, limited or restricted   Mobility: Walking and Moving Around Goal Status [id number]) At least 40 percent but less than 60 percent impaired, limited or restricted       Problem List Patient Active Problem List   Diagnosis Date Noted  . Enuresis 11/19/2015  . Pre-diabetes 08/10/2015  . Metabolic syndrome [id number]  . Malnutrition of moderate degree ([place]) 05/25/2015  . OA (osteoarthritis) of knee 07/03/2014  . HLD (hyperlipidemia) 01/04/2014  . HTN (hypertension) 01/04/2014  . Anemia, iron deficiency 12/30/2011  . Constipation 04/04/2009  . TUBULOVILLOUS ADENOMA, COLON 04/03/2009     Madelyn Flavors PT 03/07/2016, 3:39 PM  [facility] [address] [place], [place], [address] Phone: [phone]   Fax:  [phone]  Name: Beth [name] MRN: [id number] Date of Birth: March 01, 1946

## 2016-02-13 ENCOUNTER — Ambulatory Visit: Payer: Medicare Other | Admitting: Physical Therapy

## 2016-02-13 ENCOUNTER — Encounter: Payer: Self-pay | Admitting: Physical Therapy

## 2016-02-13 DIAGNOSIS — R269 Unspecified abnormalities of gait and mobility: Secondary | ICD-10-CM

## 2016-02-13 DIAGNOSIS — M25562 Pain in left knee: Secondary | ICD-10-CM

## 2016-02-13 DIAGNOSIS — R29898 Other symptoms and signs involving the musculoskeletal system: Secondary | ICD-10-CM

## 2016-02-13 NOTE — Therapy (Signed)
[facility] [place], [place], [address] Phone: [phone]   Fax:  [phone]  Physical Therapy Treatment  Patient Details  Name: Beth [name] MRN: [id number] Date of Birth: 1946/11/24 Referring Provider: [name] MD  Encounter Date: 02/13/2016      PT End of Session - 02/13/16 1428    Visit Number 2   Number of Visits 8   Date for PT Re-Evaluation 03/25/16   PT Start Time 1430   PT Stop Time 1510   PT Time Calculation (min) 40 min   Activity Tolerance Patient tolerated treatment well   Behavior During Therapy [facility] for tasks assessed/performed      Past Medical History  Diagnosis Date  . Hypertension   . CVA (cerebral infarction)     2006  . Hyperlipidemia   . Blood transfusion without reported diagnosis 2012    anemia;pt denies transfusion stated was only on iron tablet  . Heart murmur   . Nocturia     3-4 times per night  . Gout     left elbow  . Arthritis     Knee both knees  . Anemia   . 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 08-09-14 Right eye  . Family history of anesthesia complication     sister very slow to awaken after anesthesia;severe vomiting   . Stroke ([place]) 2006    x 1 no deficits noted   . Cataract     left    Past Surgical History  Procedure Laterality Date  . Carpal tunnel release Right 1983  . Abdominal hysterectomy  1983  . Colonscopy  June 21, 2014  . Nasal cauterization  2012  . Total knee arthroplasty Right 07/03/2014    Procedure: RIGHT TOTAL KNEE ARTHROPLASTY;  Surgeon: Gearlean Alf, MD;  [facility];  Service: Orthopedics;  Laterality: Right;  . Patellar tendon repair Right 08/11/2014    Procedure: RIGHT PATELLA TENDON REPAIR;  Surgeon: Gearlean Alf, MD;  [facility];  Service: Orthopedics;  Laterality: Right;  . Joint replacement  06/2014    right knee  . I&d knee with poly exchange Right 10/02/2014    Procedure: IRRIGATION AND DEBRIDEMENT RIGHT  KNEE WITH POLY EXCHANGE;  Surgeon: Gearlean Alf, MD;  [facility];  Service: Orthopedics;  Laterality: Right;  . Excisional total knee arthroplasty with antibiotic spacers Right 02/16/2015    Procedure: RIGHT KNEE RESECTION ARTHROPLASTY WITH ANTIBIOTIC SPACERS;  Surgeon: [name], MD;  [facility];  Service: Orthopedics;  Laterality: Right;  . Reimplantation of total knee Right 05/23/2015    Procedure: RIGHT KNEE ARTHROPLASTY REIMPLANTATION;  Surgeon: [name], MD;  [facility];  Service: Orthopedics;  Laterality: Right;    There were no vitals filed for this visit.  Visit Diagnosis:  Weakness of left lower extremity  Left knee pain  Abnormality of gait      Subjective Assessment - 02/13/16 1430    Subjective States that L knee is still giving her trouble.   Patient Stated Goals to be able to walk with a quad cane   Currently in Pain? Yes   Pain Score 5    Pain Location Knee   Pain Orientation Left   Pain Descriptors / Indicators Aching   Pain Type Chronic pain   Pain Onset More than a month ago            [facility] PT Assessment - 02/13/16 0001  Assessment   Medical Diagnosis chronic Left knee pain   Onset Date/Surgical Date 03/05/2015   Next MD Visit end of march   Precautions   Precautions Fall   Precaution Comments FALL RISK                     [place] Adult PT Treatment/Exercise - 02/13/16 0001    Exercises   Exercises Knee/Hip   Knee/Hip Exercises: Aerobic   Nustep L5 x15 min    Knee/Hip Exercises: Supine   Quad Sets Strengthening;Left;2 sets;10 reps   Bridges Strengthening;Both;1 set;10 reps   Straight Leg Raises Strengthening;Left;2 sets;10 reps   Other Supine Knee/Hip Exercises Supine B marching x20 reps   Modalities   Modalities Ultrasound   Ultrasound   Ultrasound Location L medioinferior knee   Ultrasound Parameters 1.5 w/cm2, 100%,1 mhz x12 min   Ultrasound Goals Pain                PT Education -  03/04/16 1448    Education provided Yes   Education Details HEP   Person(s) Educated Patient   Methods Explanation;Demonstration   Comprehension Verbalized understanding;Returned demonstration             PT Long Term Goals - 2016-03-04 1532    PT LONG TERM GOAL #1   Title Improved L knee flexion to allow foot clearance in swing phase of gait.   Time 4   Period Weeks   Status New   PT LONG TERM GOAL #2   Title decreased L knee pain to 2-3/10 or less with activity.   Time 4   Period Weeks   Status New               Plan - 02/13/16 1513    Clinical Impression Statement Patient tolerated today's treatment well although she presented continued increased L knee soreness. L knee extensor lag was noted with L SLR in supine and deficit of L knee strength was noted with both L QS and marching. Minimal contraction noted with L QS today and patient required both verbal and tactile cueing to achieve proper Quad contraction. [place] to L medioinferior knee was completed again today secondary to soreness experienced by patient with normal response. Patient was encouraged to continue HEP at home as directed in previous treatment.   Pt will benefit from skilled therapeutic intervention in order to improve on the following deficits Pain;Decreased activity tolerance;Decreased strength;Decreased range of motion;Abnormal gait   Rehab Potential Good   Clinical Impairments Affecting Rehab Potential mulitiple knee surgeries   PT Frequency 2x / week   PT Duration 4 weeks   PT Treatment/Interventions Cryotherapy;Air traffic controller;Therapeutic exercise;Manual techniques;Patient/family education;Neuromuscular re-education;Passive range of motion;Dry needling;Taping   PT Next Visit Plan Continue L knee strengthening and manual therapy as needed. Modalities and STW PRN per MPT POC   PT Home Exercise Plan active knee flex/ext   Consulted and Agree with Plan of Care  Patient          G-Codes - 2016/03/04 1530    Functional Assessment Tool Used FOTO 64% LIMITED   Functional Limitation Mobility: Walking and moving around   Mobility: Walking and Moving Around Current Status [id number]) At least 60 percent but less than 80 percent impaired, limited or restricted   Mobility: Walking and Moving Around Goal Status [id number]) At least 40 percent but less than 60 percent impaired, limited or restricted      Problem List Patient Active Problem List  Diagnosis Date Noted  . Enuresis 11/19/2015  . Pre-diabetes 08/10/2015  . Metabolic syndrome [id number]  . Malnutrition of moderate degree ([place]) 05/25/2015  . OA (osteoarthritis) of knee 07/03/2014  . HLD (hyperlipidemia) 01/04/2014  . HTN (hypertension) 01/04/2014  . Anemia, iron deficiency 12/30/2011  . Constipation 04/04/2009  . TUBULOVILLOUS ADENOMA, COLON 04/03/2009    Wynelle Fanny, PTA 02/13/2016, 3:19 PM  [facility] [address] [place], [place], [address] Phone: [phone]   Fax:  [phone]  Name: Beth [name] MRN: [id number] Date of Birth: 10/21/1946

## 2016-02-18 ENCOUNTER — Ambulatory Visit: Payer: Medicare Other | Admitting: Family Medicine

## 2016-02-19 ENCOUNTER — Encounter: Payer: Medicare Other | Admitting: *Deleted

## 2016-02-20 DIAGNOSIS — S0501XD Injury of conjunctiva and corneal abrasion without foreign body, right eye, subsequent encounter: Secondary | ICD-10-CM | POA: Diagnosis not present

## 2016-02-20 DIAGNOSIS — H25813 Combined forms of age-related cataract, bilateral: Secondary | ICD-10-CM | POA: Diagnosis not present

## 2016-02-21 ENCOUNTER — Encounter: Payer: Medicare Other | Admitting: *Deleted

## 2016-02-22 ENCOUNTER — Other Ambulatory Visit: Payer: Self-pay | Admitting: Family Medicine

## 2016-02-25 ENCOUNTER — Ambulatory Visit: Payer: Medicare Other | Admitting: Physical Therapy

## 2016-02-25 DIAGNOSIS — R29898 Other symptoms and signs involving the musculoskeletal system: Secondary | ICD-10-CM | POA: Diagnosis not present

## 2016-02-25 DIAGNOSIS — R269 Unspecified abnormalities of gait and mobility: Secondary | ICD-10-CM

## 2016-02-25 DIAGNOSIS — M25562 Pain in left knee: Secondary | ICD-10-CM

## 2016-02-25 NOTE — Therapy (Signed)
[facility] [place], [place], [address] Phone: [phone]   Fax:  [phone]  Physical Therapy Treatment  Patient Details  Name: YUJIN ANTES MRN: [id number] Date of Birth: 05-30-1946 Referring Provider: [name] MD  Encounter Date: 02/25/2016      PT End of Session - 02/25/16 1512    Visit Number 3   Number of Visits 8   Date for PT Re-Evaluation 03/25/16   PT Start Time 0230      Past Medical History  Diagnosis Date  . Hypertension   . CVA (cerebral infarction)     2006  . Hyperlipidemia   . Blood transfusion without reported diagnosis 2012    anemia;pt denies transfusion stated was only on iron tablet  . Heart murmur   . Nocturia     3-4 times per night  . Gout     left elbow  . Arthritis     Knee both knees  . Anemia   . 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 08-09-14 Right eye  . Family history of anesthesia complication     sister very slow to awaken after anesthesia;severe vomiting   . Stroke ([place]) 2006    x 1 no deficits noted   . Cataract     left    Past Surgical History  Procedure Laterality Date  . Carpal tunnel release Right 1983  . Abdominal hysterectomy  1983  . Colonscopy  June 21, 2014  . Nasal cauterization  2012  . Total knee arthroplasty Right 07/03/2014    Procedure: RIGHT TOTAL KNEE ARTHROPLASTY;  Surgeon: Gearlean Alf, MD;  [facility];  Service: Orthopedics;  Laterality: Right;  . Patellar tendon repair Right 08/11/2014    Procedure: RIGHT PATELLA TENDON REPAIR;  Surgeon: Gearlean Alf, MD;  [facility];  Service: Orthopedics;  Laterality: Right;  . Joint replacement  06/2014    right knee  . I&d knee with poly exchange Right 10/02/2014    Procedure: IRRIGATION AND DEBRIDEMENT RIGHT KNEE WITH POLY EXCHANGE;  Surgeon: Gearlean Alf, MD;  [facility];  Service: Orthopedics;  Laterality: Right;  . Excisional total knee arthroplasty with  antibiotic spacers Right 02/16/2015    Procedure: RIGHT KNEE RESECTION ARTHROPLASTY WITH ANTIBIOTIC SPACERS;  Surgeon: [name], MD;  [facility];  Service: Orthopedics;  Laterality: Right;  . Reimplantation of total knee Right 05/23/2015    Procedure: RIGHT KNEE ARTHROPLASTY REIMPLANTATION;  Surgeon: [name], MD;  [facility];  Service: Orthopedics;  Laterality: Right;    There were no vitals filed for this visit.  Visit Diagnosis:  Weakness of left lower extremity  Left knee pain  Abnormality of gait      Subjective Assessment - 02/25/16 1517    Subjective Pain is a 5-6/10 today.   Patient Stated Goals to be able to walk with a quad cane   Pain Score 6    Pain Location Knee   Pain Orientation Left   Pain Descriptors / Indicators Aching   Pain Type Chronic pain   Pain Frequency Constant                                      PT Long Term Goals - 02/12/16 1532    PT LONG TERM GOAL #1   Title Improved L knee flexion to allow foot clearance  in swing phase of gait.   Time 4   Period Weeks   Status New   PT LONG TERM GOAL #2   Title decreased L knee pain to 2-3/10 or less with activity.   Time 4   Period Weeks   Status New               Problem List Patient Active Problem List   Diagnosis Date Noted  . Enuresis 11/19/2015  . Pre-diabetes 08/10/2015  . Metabolic syndrome [id number]  . Malnutrition of moderate degree ([place]) 05/25/2015  . OA (osteoarthritis) of knee 07/03/2014  . HLD (hyperlipidemia) 01/04/2014  . HTN (hypertension) 01/04/2014  . Anemia, iron deficiency 12/30/2011  . Constipation 04/04/2009  . TUBULOVILLOUS ADENOMA, COLON 04/03/2009   Treatment:  Nustep x 15 minutes @ level 5 f/b SAQ's with 3# x 3 minutes then non-resisted x 3 minutes f/b 3 minutes of STW/M to left distal ITB/lateral knee joint line (patient's CC of pain today) f/b HMP and constant pre-mod e'stim x 15 minutes.  Patient tolerated  treatment well.  APPLEGATE, [place] MPT 02/25/2016, 3:21 PM  [facility] [address] [place], [place], [address] Phone: [phone]   Fax:  [phone]  Name: CONNA SHAH MRN: [id number] Date of Birth: 1946/04/16

## 2016-02-28 ENCOUNTER — Inpatient Hospital Stay (HOSPITAL_COMMUNITY): Payer: Medicare Other

## 2016-02-28 ENCOUNTER — Encounter (HOSPITAL_COMMUNITY): Payer: Self-pay | Admitting: Cardiology

## 2016-02-28 ENCOUNTER — Emergency Department (HOSPITAL_COMMUNITY): Payer: Medicare Other

## 2016-02-28 ENCOUNTER — Inpatient Hospital Stay (HOSPITAL_COMMUNITY)
Admission: EM | Admit: 2016-02-28 | Discharge: 2016-03-01 | DRG: 305 | Disposition: A | Discharge status: Home / Self Care | Payer: Medicare Other | Attending: Internal Medicine | Admitting: Internal Medicine

## 2016-02-28 DIAGNOSIS — E785 Hyperlipidemia, unspecified: Secondary | ICD-10-CM | POA: Diagnosis not present

## 2016-02-28 DIAGNOSIS — R7989 Other specified abnormal findings of blood chemistry: Secondary | ICD-10-CM | POA: Diagnosis present

## 2016-02-28 DIAGNOSIS — M170 Bilateral primary osteoarthritis of knee: Secondary | ICD-10-CM | POA: Diagnosis present

## 2016-02-28 DIAGNOSIS — Z79899 Other long term (current) drug therapy: Secondary | ICD-10-CM | POA: Diagnosis not present

## 2016-02-28 DIAGNOSIS — E860 Dehydration: Secondary | ICD-10-CM | POA: Diagnosis not present

## 2016-02-28 DIAGNOSIS — Z886 Allergy status to analgesic agent status: Secondary | ICD-10-CM | POA: Diagnosis not present

## 2016-02-28 DIAGNOSIS — N179 Acute kidney failure, unspecified: Secondary | ICD-10-CM | POA: Diagnosis present

## 2016-02-28 DIAGNOSIS — Z96651 Presence of right artificial knee joint: Secondary | ICD-10-CM | POA: Diagnosis present

## 2016-02-28 DIAGNOSIS — E1122 Type 2 diabetes mellitus with diabetic chronic kidney disease: Secondary | ICD-10-CM | POA: Diagnosis not present

## 2016-02-28 DIAGNOSIS — R778 Other specified abnormalities of plasma proteins: Secondary | ICD-10-CM | POA: Diagnosis present

## 2016-02-28 DIAGNOSIS — Z91013 Allergy to seafood: Secondary | ICD-10-CM

## 2016-02-28 DIAGNOSIS — I161 Hypertensive emergency: Principal | ICD-10-CM | POA: Diagnosis present

## 2016-02-28 DIAGNOSIS — M25529 Pain in unspecified elbow: Secondary | ICD-10-CM | POA: Diagnosis not present

## 2016-02-28 DIAGNOSIS — N183 Chronic kidney disease, stage 3 unspecified: Secondary | ICD-10-CM | POA: Diagnosis present

## 2016-02-28 DIAGNOSIS — G467 Other lacunar syndromes: Secondary | ICD-10-CM | POA: Diagnosis not present

## 2016-02-28 DIAGNOSIS — Z8249 Family history of ischemic heart disease and other diseases of the circulatory system: Secondary | ICD-10-CM | POA: Diagnosis not present

## 2016-02-28 DIAGNOSIS — M109 Gout, unspecified: Secondary | ICD-10-CM | POA: Diagnosis present

## 2016-02-28 DIAGNOSIS — I6789 Other cerebrovascular disease: Secondary | ICD-10-CM | POA: Diagnosis not present

## 2016-02-28 DIAGNOSIS — Z90710 Acquired absence of both cervix and uterus: Secondary | ICD-10-CM

## 2016-02-28 DIAGNOSIS — I10 Essential (primary) hypertension: Secondary | ICD-10-CM | POA: Diagnosis present

## 2016-02-28 DIAGNOSIS — I129 Hypertensive chronic kidney disease with stage 1 through stage 4 chronic kidney disease, or unspecified chronic kidney disease: Secondary | ICD-10-CM | POA: Diagnosis not present

## 2016-02-28 DIAGNOSIS — M25522 Pain in left elbow: Secondary | ICD-10-CM | POA: Diagnosis present

## 2016-02-28 DIAGNOSIS — R7303 Prediabetes: Secondary | ICD-10-CM | POA: Diagnosis present

## 2016-02-28 DIAGNOSIS — I6381 Other cerebral infarction due to occlusion or stenosis of small artery: Secondary | ICD-10-CM

## 2016-02-28 DIAGNOSIS — Z7984 Long term (current) use of oral hypoglycemic drugs: Secondary | ICD-10-CM | POA: Diagnosis not present

## 2016-02-28 DIAGNOSIS — I69354 Hemiplegia and hemiparesis following cerebral infarction affecting left non-dominant side: Secondary | ICD-10-CM

## 2016-02-28 DIAGNOSIS — Z888 Allergy status to other drugs, medicaments and biological substances status: Secondary | ICD-10-CM | POA: Diagnosis not present

## 2016-02-28 DIAGNOSIS — M21332 Wrist drop, left wrist: Secondary | ICD-10-CM | POA: Diagnosis not present

## 2016-02-28 DIAGNOSIS — I639 Cerebral infarction, unspecified: Secondary | ICD-10-CM | POA: Diagnosis not present

## 2016-02-28 DIAGNOSIS — I6350 Cerebral infarction due to unspecified occlusion or stenosis of unspecified cerebral artery: Secondary | ICD-10-CM

## 2016-02-28 DIAGNOSIS — R531 Weakness: Secondary | ICD-10-CM | POA: Diagnosis not present

## 2016-02-28 DIAGNOSIS — G5632 Lesion of radial nerve, left upper limb: Secondary | ICD-10-CM | POA: Diagnosis not present

## 2016-02-28 DIAGNOSIS — Z833 Family history of diabetes mellitus: Secondary | ICD-10-CM | POA: Diagnosis not present

## 2016-02-28 DIAGNOSIS — R29898 Other symptoms and signs involving the musculoskeletal system: Secondary | ICD-10-CM

## 2016-02-28 HISTORY — DX: Chronic kidney disease, stage 3 (moderate): N18.3

## 2016-02-28 HISTORY — DX: Chronic kidney disease, stage 3 unspecified: N18.30

## 2016-02-28 LAB — I-STAT CHEM 8, ED
BUN: 15 mg/dL (ref 6–20)
CALCIUM ION: 1.1 mmol/L — AB (ref 1.13–1.30)
CHLORIDE: 93 mmol/L — AB (ref 101–111)
Creatinine, Ser: 1.2 mg/dL — ABNORMAL HIGH (ref 0.44–1.00)
GLUCOSE: 158 mg/dL — AB (ref 65–99)
HCT: 46 % (ref 36.0–46.0)
Hemoglobin: 15.6 g/dL — ABNORMAL HIGH (ref 12.0–15.0)
Potassium: 3.7 mmol/L (ref 3.5–5.1)
Sodium: 134 mmol/L — ABNORMAL LOW (ref 135–145)
TCO2: 29 mmol/L (ref 0–100)

## 2016-02-28 LAB — I-STAT TROPONIN, ED
Troponin i, poc: 0.09 ng/mL (ref 0.00–0.08)
Troponin i, poc: 0.09 ng/mL (ref 0.00–0.08)

## 2016-02-28 LAB — PROTIME-INR
INR: 1.23 (ref 0.00–1.49)
PROTHROMBIN TIME: 15.7 s — AB (ref 11.6–15.2)

## 2016-02-28 LAB — COMPREHENSIVE METABOLIC PANEL
ALBUMIN: 3.5 g/dL (ref 3.5–5.0)
ALK PHOS: 111 U/L (ref 38–126)
ALT: 12 U/L — ABNORMAL LOW (ref 14–54)
ANION GAP: 10 (ref 5–15)
AST: 19 U/L (ref 15–41)
BILIRUBIN TOTAL: 1.4 mg/dL — AB (ref 0.3–1.2)
BUN: 13 mg/dL (ref 6–20)
CALCIUM: 9.4 mg/dL (ref 8.9–10.3)
CO2: 29 mmol/L (ref 22–32)
Chloride: 95 mmol/L — ABNORMAL LOW (ref 101–111)
Creatinine, Ser: 1.33 mg/dL — ABNORMAL HIGH (ref 0.44–1.00)
GFR calc Af Amer: 45 mL/min — ABNORMAL LOW (ref >60)
GFR calc non Af Amer: 39 mL/min — ABNORMAL LOW (ref >60)
GLUCOSE: 163 mg/dL — AB (ref 65–99)
Potassium: 3.9 mmol/L (ref 3.5–5.1)
Sodium: 134 mmol/L — ABNORMAL LOW (ref 135–145)
TOTAL PROTEIN: 8.4 g/dL — AB (ref 6.5–8.1)

## 2016-02-28 LAB — CREATININE, URINE, RANDOM: Creatinine, Urine: 104.99 mg/dL

## 2016-02-28 LAB — CBC
HCT: 40.1 % (ref 36.0–46.0)
HEMOGLOBIN: 12.9 g/dL (ref 12.0–15.0)
MCH: 24.8 pg — AB (ref 26.0–34.0)
MCHC: 32.2 g/dL (ref 30.0–36.0)
MCV: 77.1 fL — ABNORMAL LOW (ref 78.0–100.0)
PLATELETS: 344 10*3/uL (ref 150–400)
RBC: 5.2 MIL/uL — ABNORMAL HIGH (ref 3.87–5.11)
RDW: 16.2 % — AB (ref 11.5–15.5)
WBC: 10.5 10*3/uL (ref 4.0–10.5)

## 2016-02-28 LAB — RAPID URINE DRUG SCREEN, HOSP PERFORMED
Amphetamines: NOT DETECTED
BARBITURATES: NOT DETECTED
Benzodiazepines: NOT DETECTED
Cocaine: NOT DETECTED
Opiates: POSITIVE — AB
TETRAHYDROCANNABINOL: NOT DETECTED

## 2016-02-28 LAB — DIFFERENTIAL
Basophils Absolute: 0 10*3/uL (ref 0.0–0.1)
Basophils Relative: 0 %
EOS PCT: 2 %
Eosinophils Absolute: 0.2 10*3/uL (ref 0.0–0.7)
LYMPHS ABS: 1.9 10*3/uL (ref 0.7–4.0)
LYMPHS PCT: 18 %
MONO ABS: 1.3 10*3/uL — AB (ref 0.1–1.0)
MONOS PCT: 12 %
NEUTROS PCT: 68 %
Neutro Abs: 7 10*3/uL (ref 1.7–7.7)

## 2016-02-28 LAB — APTT: APTT: 33 s (ref 24–37)

## 2016-02-28 LAB — URIC ACID: Uric Acid, Serum: 5.8 mg/dL (ref 2.3–6.6)

## 2016-02-28 LAB — TROPONIN I: TROPONIN I: 0.11 ng/mL — AB (ref <0.031)

## 2016-02-28 LAB — GLUCOSE, CAPILLARY: GLUCOSE-CAPILLARY: 209 mg/dL — AB (ref 65–99)

## 2016-02-28 MED ORDER — FERROUS FUMARATE 325 (106 FE) MG PO TABS: 1.0000 | ORAL_TABLET | Freq: Two times a day (BID) | ORAL | Status: DC

## 2016-02-28 MED ORDER — STROKE: EARLY STAGES OF RECOVERY BOOK
Freq: Once | Status: DC
  Filled 2016-02-28: qty 1

## 2016-02-28 MED ORDER — ACETAMINOPHEN 325 MG PO TABS
650.0000 mg | ORAL_TABLET | Freq: Four times a day (QID) | ORAL | Status: DC | PRN
  Administered 2016-02-29 (×2): 650 mg via ORAL
  Filled 2016-02-28 (×2): qty 2

## 2016-02-28 MED ORDER — INSULIN ASPART 100 UNIT/ML ~~LOC~~ SOLN
0.0000 [IU] | Freq: Every day | SUBCUTANEOUS | Status: DC
  Administered 2016-02-28: 2 [IU] via SUBCUTANEOUS

## 2016-02-28 MED ORDER — MORPHINE SULFATE (PF) 4 MG/ML IV SOLN
4.0000 mg | Freq: Once | INTRAVENOUS | Status: AC
  Administered 2016-02-28: 4 mg via INTRAVENOUS
  Filled 2016-02-28: qty 1

## 2016-02-28 MED ORDER — HYDRALAZINE HCL 20 MG/ML IJ SOLN
5.0000 mg | INTRAMUSCULAR | Status: DC | PRN
  Administered 2016-02-28: 5 mg via INTRAVENOUS
  Filled 2016-02-28: qty 1

## 2016-02-28 MED ORDER — SODIUM CHLORIDE 0.9 % IV SOLN
INTRAVENOUS | Status: DC
  Administered 2016-02-28: via INTRAVENOUS

## 2016-02-28 MED ORDER — NEBIVOLOL HCL 10 MG PO TABS
20.0000 mg | ORAL_TABLET | Freq: Every evening | ORAL | Status: DC
  Administered 2016-02-28 – 2016-03-01 (×3): 20 mg via ORAL
  Filled 2016-02-28: qty 4
  Filled 2016-02-28 (×2): qty 2
  Filled 2016-02-28 (×2): qty 4
  Filled 2016-02-28: qty 2

## 2016-02-28 MED ORDER — ONDANSETRON HCL 4 MG/2ML IJ SOLN: 4.0000 mg | Freq: Three times a day (TID) | INTRAMUSCULAR | Status: DC | PRN

## 2016-02-28 MED ORDER — MORPHINE SULFATE (PF) 2 MG/ML IV SOLN
1.0000 mg | INTRAVENOUS | Status: DC | PRN
  Administered 2016-02-29: 1 mg via INTRAVENOUS
  Filled 2016-02-28: qty 1

## 2016-02-28 MED ORDER — ONDANSETRON HCL 4 MG/2ML IJ SOLN
4.0000 mg | Freq: Once | INTRAMUSCULAR | Status: AC
  Administered 2016-02-28: 4 mg via INTRAVENOUS
  Filled 2016-02-28: qty 2

## 2016-02-28 MED ORDER — INSULIN ASPART 100 UNIT/ML ~~LOC~~ SOLN
0.0000 [IU] | Freq: Three times a day (TID) | SUBCUTANEOUS | Status: DC
  Administered 2016-02-29: 2 [IU] via SUBCUTANEOUS
  Administered 2016-03-01: 3 [IU] via SUBCUTANEOUS

## 2016-02-28 MED ORDER — COLCHICINE 0.6 MG PO TABS
0.6000 mg | ORAL_TABLET | Freq: Two times a day (BID) | ORAL | Status: DC
  Administered 2016-02-28 – 2016-03-01 (×4): 0.6 mg via ORAL
  Filled 2016-02-28 (×4): qty 1

## 2016-02-28 MED ORDER — ENSURE ENLIVE PO LIQD
237.0000 mL | Freq: Two times a day (BID) | ORAL | Status: DC
  Administered 2016-02-29 – 2016-03-01 (×4): 237 mL via ORAL

## 2016-02-28 MED ORDER — OXYCODONE HCL 5 MG PO TABS
5.0000 mg | ORAL_TABLET | Freq: Four times a day (QID) | ORAL | Status: DC | PRN
  Administered 2016-03-01: 5 mg via ORAL
  Filled 2016-02-28 (×2): qty 1

## 2016-02-28 MED ORDER — ATORVASTATIN CALCIUM 20 MG PO TABS
20.0000 mg | ORAL_TABLET | Freq: Every evening | ORAL | Status: DC
  Administered 2016-02-28 – 2016-03-01 (×3): 20 mg via ORAL
  Filled 2016-02-28 (×3): qty 1

## 2016-02-28 MED ORDER — SENNOSIDES-DOCUSATE SODIUM 8.6-50 MG PO TABS: 1.0000 | ORAL_TABLET | Freq: Every evening | ORAL | Status: DC | PRN

## 2016-02-28 MED ORDER — AMLODIPINE BESYLATE 10 MG PO TABS
10.0000 mg | ORAL_TABLET | Freq: Every evening | ORAL | Status: DC
  Administered 2016-02-28 – 2016-03-01 (×3): 10 mg via ORAL
  Filled 2016-02-28: qty 1
  Filled 2016-02-28: qty 2
  Filled 2016-02-28: qty 1

## 2016-02-28 MED ORDER — DARIFENACIN HYDROBROMIDE ER 7.5 MG PO TB24
7.5000 mg | ORAL_TABLET | Freq: Every day | ORAL | Status: DC
  Administered 2016-02-28 – 2016-03-01 (×3): 7.5 mg via ORAL
  Filled 2016-02-28 (×4): qty 1

## 2016-02-28 MED ORDER — ENOXAPARIN SODIUM 40 MG/0.4ML ~~LOC~~ SOLN
40.0000 mg | SUBCUTANEOUS | Status: DC
  Administered 2016-02-29 – 2016-03-01 (×2): 40 mg via SUBCUTANEOUS
  Filled 2016-02-28 (×2): qty 0.4

## 2016-02-28 MED ORDER — FERROUS FUMARATE 324 (106 FE) MG PO TABS
1.0000 | ORAL_TABLET | Freq: Two times a day (BID) | ORAL | Status: DC
  Administered 2016-02-28 – 2016-03-01 (×4): 106 mg via ORAL
  Filled 2016-02-28 (×6): qty 1

## 2016-02-28 NOTE — ED Notes (Signed)
Dr Niu at bedside 

## 2016-02-28 NOTE — ED Notes (Signed)
Neurology at bedside.

## 2016-02-28 NOTE — ED Notes (Signed)
Attempted to call report

## 2016-02-28 NOTE — ED Notes (Signed)
Nurse first notified of patients troponin. Will move patient back to next available room.

## 2016-02-28 NOTE — ED Notes (Signed)
Samantha PA at bedside 

## 2016-02-28 NOTE — ED Notes (Signed)
Pt reports pain that starts in the left elbow into her hand that started about 3 days ago. Reports she has a hx of gout, but does have decreased grip strength in the left hand.

## 2016-02-28 NOTE — H&P (Addendum)
[facility] History and Physical  Beth [name] [id number] DOB: [birth year] DOA: 02/28/2016  Referring physician: ED physician PCP: Claretta Fraise, MD  Specialists:   Chief Complaint: Left elbow pain, worsening left-sided weakness  HPI: Beth [name] is a 70 y.o. female with PMH of stroke with left-sided weakness, left elbow gout, hypertension, hyperlipidemia, prediabetes, chronic kidney disease-stage III, who presents with left elbow pain, worsening left-sided weakness.  Pt reports that she has hx of gout in left elbow. In the past 3 days, she has been having pain and swelling in left elbow, which she thinks it is gout flare up. She also reports that she has left-sided weakness from previous stroke, but feels like her weakness is worsening in left side, particularly in the left hands. Patient does not have chest pain, shortness of breath. No fever, chills, cough, nausea, vomiting, abdominal pain, diarrhea. No vision change or hearing loss.  In ED, patient was found to have elevated blood pressure at 210/115-->188/106, elevated troponin 0.09-->0.09, INR 1.23, PTT 33, temperature normal, no tachycardia, worsening renal function, WBC 10.5. CT-head showed chronic involutional change with smaller foci of low attenuation right basal ganglia and right thalamus. Subacute lacunar infarcts suspected. Patient is admitted to inpatient for further evaluation treatment. Neurology was consulted by EDP.  EKG: Independently reviewed. QTC 474, Q wave in III/aVF, T-wave inversion in V1-V2  Where does patient live?   At home Can patient participate in ADLs? Little  Review of Systems:   General: no fevers, chills, no changes in body weight, has fatigue HEENT: no blurry vision, hearing changes or sore throat Pulm: no dyspnea, coughing, wheezing CV: no chest pain, no palpitations Abd: no nausea, vomiting, abdominal pain, diarrhea, constipation GU: no dysuria, burning on urination, increased urinary  frequency, hematuria  Ext: has pain and swelling in left elbow Neuro: has left sided weakness. No vision change or hearing loss Skin: no rash MSK: No muscle spasm, no deformity, no limitation of range of movement in spin Heme: No easy bruising.  Travel history: No recent long distant travel.  Allergy:  Allergies  Allergen Reactions  . Asa [Aspirin]     Nose bleeding  . Aleve [Naproxen Sodium]     Heart races  . Clonidine Derivatives Other (See Comments)    Dizziness and weakness  . Shellfish Allergy Nausea And Vomiting    Past Medical History  Diagnosis Date  . Hypertension   . CVA (cerebral infarction)     2006  . Hyperlipidemia   . Blood transfusion without reported diagnosis 2012    anemia;pt denies transfusion stated was only on iron tablet  . Heart murmur   . Nocturia     3-4 times per night  . Gout     left elbow  . Arthritis     Knee both knees  . Anemia   . 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 08-09-14 Right eye  . Family history of anesthesia complication     sister very slow to awaken after anesthesia;severe vomiting   . Stroke ([place]) 2006    x 1 no deficits noted   . Cataract     left  . CKD (chronic kidney disease), stage III     Past Surgical History  Procedure Laterality Date  . Carpal tunnel release Right 1983  . Abdominal hysterectomy  1983  . Colonscopy  June 21, 2014  . Nasal cauterization  2012  . Total knee arthroplasty Right 07/03/2014    Procedure: RIGHT  TOTAL KNEE ARTHROPLASTY;  Surgeon: Gearlean Alf, MD;  Location: [facility];  Service: Orthopedics;  Laterality: Right;  . Patellar tendon repair Right 08/11/2014    Procedure: RIGHT PATELLA TENDON REPAIR;  Surgeon: Gearlean Alf, MD;  Location: [facility];  Service: Orthopedics;  Laterality: Right;  . Joint replacement  06/2014    right knee  . I&d knee with poly exchange Right 10/02/2014    Procedure: IRRIGATION AND DEBRIDEMENT RIGHT KNEE WITH POLY EXCHANGE;  Surgeon: Gearlean Alf, MD;  Location: [facility];  Service: Orthopedics;  Laterality: Right;  . Excisional total knee arthroplasty with antibiotic spacers Right 02/16/2015    Procedure: RIGHT KNEE RESECTION ARTHROPLASTY WITH ANTIBIOTIC SPACERS;  Surgeon: [name], MD;  Location: [facility];  Service: Orthopedics;  Laterality: Right;  . Reimplantation of total knee Right 05/23/2015    Procedure: RIGHT KNEE ARTHROPLASTY REIMPLANTATION;  Surgeon: [name], MD;  Location: [facility];  Service: Orthopedics;  Laterality: Right;    Social History:  reports that she has never smoked. She has never used smokeless tobacco. She reports that she does not drink alcohol or use illicit drugs.  Family History:  Family History  Problem Relation Age of Onset  . Ovarian cancer Mother   . Diabetes Son   . Diabetes Son   . Colon cancer Neg Hx   . Esophageal cancer Neg Hx   . Stomach cancer Neg Hx   . Rectal cancer Neg Hx   . Peripheral vascular disease Father     with amputation of both legs  . Hypertension Father   . Heart disease Brother   . Kidney disease Daughter      Prior to Admission medications   Medication Sig Start Date End Date Taking? Authorizing Provider  acetaminophen (TYLENOL) 500 MG tablet Take 1,000 mg by mouth every 6 (six) hours as needed for mild pain.   Yes Historical Provider, MD  amLODipine (NORVASC) 10 MG tablet Take 1 tablet (10 mg total) by mouth every evening. 08/24/15  Yes Claretta Fraise, MD  atorvastatin (LIPITOR) 20 MG tablet Take 1 tablet (20 mg total) by mouth every evening. 08/24/15  Yes Claretta Fraise, MD  ferrous fumarate (HEMOCYTE - 106 MG FE) 325 (106 FE) MG TABS tablet Take 1 tablet (106 mg of iron total) by mouth 2 (two) times daily. 08/17/15  Yes Claretta Fraise, MD  metFORMIN (GLUCOPHAGE-XR) 500 MG 24 hr tablet TAKE ONE TABLET BY MOUTH ONCE DAILY WITH BREAKFAST 02/25/16  Yes Claretta Fraise, MD  Nebivolol HCl (BYSTOLIC) 20 MG TABS Take 1 tablet (20 mg total) by mouth every evening. 07/30/15  Yes  Claretta Fraise, MD  olmesartan-hydrochlorothiazide (BENICAR HCT) 40-12.5 MG tablet Take 1 tablet by mouth daily.   Yes Historical Provider, MD  oxyCODONE (OXY IR/ROXICODONE) 5 MG immediate release tablet Take 5 mg by mouth every 6 (six) hours as needed for moderate pain.  02/27/16  Yes Historical Provider, MD  solifenacin (VESICARE) 5 MG tablet Take 5 mg by mouth daily.   Yes Historical Provider, MD    Physical Exam: Filed Vitals:   02/28/16 1745 02/28/16 1900 02/28/16 1915 02/28/16 1930  BP: 188/106 174/103 191/100 181/110  Pulse: 68 68 64 66  Temp:      TempSrc:      Resp: 15 20 18 17   SpO2: 99% 96% 96% 97%   General: Not in acute distress HEENT:       Eyes: PERRL, EOMI, no scleral icterus.  ENT: No discharge from the ears and nose, no pharynx injection, no tonsillar enlargement.        Neck: No JVD, no bruit, no mass felt. Heme: No neck lymph node enlargement. Cardiac: S1/S2, RRR, 2/6 diastolic murmurs, No gallops or rubs. Pulm: No rales, wheezing, rhonchi or rubs. Abd: Soft, nondistended, nontender, no rebound pain, no organomegaly, BS present. Ext: No pitting leg edema bilaterally. 2+DP/PT pulse bilaterally. Musculoskeletal: there is tenderness, swelling and warmth in left elbow. Skin: No rashes.  Neuro: Alert, oriented X3, cranial nerves II-XII grossly intact. Muscle strength 2/5 in left arm and 4/4 in left leg. Sensation to light touch is slightly decreased in left side. Knee reflex 1+ bilaterally. Negative Babinski's sign. Normal finger to nose test. Psych: Patient is not psychotic, no suicidal or hemocidal ideation.  Labs on Admission:  Basic Metabolic Panel:  Recent Labs Lab 02/28/16 1541 02/28/16 1557  NA 134* 134*  K 3.9 3.7  CL 95* 93*  CO2 29  --   GLUCOSE 163* 158*  BUN 13 15  CREATININE 1.33* 1.20*  CALCIUM 9.4  --    Liver Function Tests:  Recent Labs Lab 02/28/16 1541  AST 19  ALT 12*  ALKPHOS 111  BILITOT 1.4*  PROT 8.4*  ALBUMIN 3.5    No results for input(s): LIPASE, AMYLASE in the last 168 hours. No results for input(s): AMMONIA in the last 168 hours. CBC:  Recent Labs Lab 02/28/16 1541 02/28/16 1557  WBC 10.5  --   NEUTROABS 7.0  --   HGB 12.9 15.6*  HCT 40.1 46.0  MCV 77.1*  --   PLT 344  --    Cardiac Enzymes: No results for input(s): CKTOTAL, CKMB, CKMBINDEX, TROPONINI in the last 168 hours.  BNP (last 3 results) No results for input(s): BNP in the last 8760 hours.  ProBNP (last 3 results) No results for input(s): PROBNP in the last 8760 hours.  CBG: No results for input(s): GLUCAP in the last 168 hours.  Radiological Exams on Admission: Ct Head Wo Contrast  02/28/2016  CLINICAL DATA:  Left side weakness. Loss of left hand grip strength with onset 2 days ago. EXAM: CT HEAD WITHOUT CONTRAST TECHNIQUE: Contiguous axial images were obtained from the base of the skull through the vertex without intravenous contrast. COMPARISON:  None available FINDINGS: Moderate diffuse atrophy. Low attenuation in the deep white matter. More prominent low attenuation in the region of the right basal ganglia does and right thalamus focally. No hemorrhage or extra-axial fluid. No hydrocephalus. Calvarium intact. Extensive atherosclerotic calcification of the intracranial vessels. IMPRESSION: Chronic involutional change with smaller foci of low attenuation right basal ganglia and right thalamus. Subacute lacunar infarcts suspected. Further evaluation with MRI is recommended. Electronically Signed   By: Skipper Cliche M.D.   On: 02/28/2016 18:20    Assessment/Plan Principal Problem:   Hypertensive emergency Active Problems:   HLD (hyperlipidemia)   HTN (hypertension)   Pre-diabetes   Hyperlipidemia   Gout   Stroke ([place])   Acute renal failure superimposed on stage 3 chronic kidney disease (HCC)   Elevated troponin   Hypertensive emergency: Patient has elevated blood pressure at 210/115, worsening renal function and  possible subacute stroke, consistent with hypertensive emergency. After treated with morphine for left elbow pain, her blood pressure has improved to 188/106 when I saw patient in the emergency room.  -Will admit to tele bed -continue Amlodipine, nebivolol - hold Benicar-HCTZ due to worsening renal function and possible subacute stroke (  need permissive hypertension) -IV hydralazine when necessary  Hx of sroke: has hx of stroke with left sided weakness, but pt states that her weakness has worsened today. CT head showed possible subacute lacunar infarcts. Neurology, Dr. Nicole Kindred was consulted by EDP.  - Neurology was consulted by EDP, will follow up recommendations. - Risk factor modification: HgbA1c, fasting lipid panel  - MRI, MRA of the brain without contrast  - PT consult, OT consult, Speech consult  - 2 d Echocardiogram  - Ekg  - Carotid dopplers  - pt is allergic to Aspirin. Consider to start plavix, but will follow Neuro recommendations - check UDS  Addendum: pt's MRI and MRA of brain are negative. Dr. Nicole Kindred evaluated pt and recommended as follows: 1. MRI of cervical spine to rule out cervical myelopathy 2. Cockup splint for left wrist 3. Physical and occupational therapy consults 4. No further stroke workup indicated. -Will d/c carotid doppler -Highly appreciated Dr. Les Pou recommendations   Elevate trop: trop 0.09 and repeated trop is still 0.09. Likely due to demanding ischemia secondary to hypertensive emergency. -Blood pressure control as above -Troponin 3 -2-D echo -Continue Lipitor and nebivolol -No aspirin since patient is allergic  Gout flare up in left elbow:  -start colchicine  -check uric acid level  HLD: Last LDL was 81 on 07/30/15 -Continue home medications: Lipitor -Check FLP  Pre-diabetes: Last A1c 6.3 on 11/19/15, well controled. Patient is taking metformin at home -SSI -Check A1c  Acute renal failure superimposed on stage 3 chronic kidney  disease ([place]): Baseline Cre is about 1.0, her Cre is 1.33 on admission. Likely due to prerenal secondary to dehydration and continuation of ARB and diruetics - IVF; NS 75 cc/h - Check FeUrea - Follow up renal function by BMP - Hold Benicar-HCTZ  DVT ppx: SQ Lovenox  Code Status: Full code Family Communication:  Yes, patient's daughter and son at bed side Disposition Plan: Admit to inpatient   Date of Service 02/28/2016    Ivor Costa [facility] Pager [phone]  If 7PM-7AM, please contact night-coverage [email or web address] Password [facility] 02/28/2016, 7:57 PM

## 2016-02-28 NOTE — ED Notes (Signed)
Patient transported to MRI 

## 2016-02-28 NOTE — ED Notes (Signed)
Patient transported to CT 

## 2016-02-28 NOTE — ED Provider Notes (Signed)
CSN: [id number]     Arrival date & time 02/28/16  1410 History   First MD Initiated Contact with Patient 02/28/16 1654     Chief Complaint  Patient presents with  . Elbow Pain  . Hand Pain  . Weakness     (Consider location/radiation/quality/duration/timing/severity/associated sxs/prior Treatment) HPI   Beth [name] is a 70 year old female with a past medical history of CVA with residual left-sided weakness, HTN, HLD, gout who presents to the emergency department today complaining of weakness and left hand and left lower extremity. Patient also complaining of left elbow pain, swelling and redness. Patient states that over the last 2-3 days she has been experiencing increased weakness in her left hand, left lower leg. Today when she woke up she was unable to ambulate due to her left leg being so weak and she was having to drag her left foot. Patient also states that she is unable to grip with her left hand or extend her fingers due to weakness. These weaknesses significantly worsened today when she woke up this morning. No associated slurred speech or facial droop. Patient also states that she noticed her left elbow beginning to swell and become acutely painful. She has a history of gout specifically in her left elbow and feels that this is a recurrence of that. She has pain with extension and flexion. She denies fevers or chills or trauma to that elbow. Patient also denies chest pain, shortness of breath, dizziness, syncope, diaphoresis.   Past Medical History  Diagnosis Date  . Hypertension   . CVA (cerebral infarction)     2006  . Hyperlipidemia   . Blood transfusion without reported diagnosis 2012    anemia;pt denies transfusion stated was only on iron tablet  . Heart murmur   . Nocturia     3-4 times per night  . Gout     left elbow  . Arthritis     Knee both knees  . Anemia   . 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 08-09-14 Right eye  . Family history of anesthesia  complication     sister very slow to awaken after anesthesia;severe vomiting   . Stroke ([place]) 2006    x 1 no deficits noted   . Cataract     left   Past Surgical History  Procedure Laterality Date  . Carpal tunnel release Right 1983  . Abdominal hysterectomy  1983  . Colonscopy  June 21, 2014  . Nasal cauterization  2012  . Total knee arthroplasty Right 07/03/2014    Procedure: RIGHT TOTAL KNEE ARTHROPLASTY;  Surgeon: Gearlean Alf, MD;  Location: [facility];  Service: Orthopedics;  Laterality: Right;  . Patellar tendon repair Right 08/11/2014    Procedure: RIGHT PATELLA TENDON REPAIR;  Surgeon: Gearlean Alf, MD;  Location: [facility];  Service: Orthopedics;  Laterality: Right;  . Joint replacement  06/2014    right knee  . I&d knee with poly exchange Right 10/02/2014    Procedure: IRRIGATION AND DEBRIDEMENT RIGHT KNEE WITH POLY EXCHANGE;  Surgeon: Gearlean Alf, MD;  Location: [facility];  Service: Orthopedics;  Laterality: Right;  . Excisional total knee arthroplasty with antibiotic spacers Right 02/16/2015    Procedure: RIGHT KNEE RESECTION ARTHROPLASTY WITH ANTIBIOTIC SPACERS;  Surgeon: [name], MD;  [facility]: [facility];  Service: Orthopedics;  Laterality: Right;  . Reimplantation of total knee Right 05/23/2015    Procedure: RIGHT KNEE ARTHROPLASTY REIMPLANTATION;  Surgeon: [name], MD;  [facility]: [facility];  Service: Orthopedics;  Laterality: Right;   Family History  Problem Relation Age of Onset  . Ovarian cancer Mother   . Diabetes Son   . Diabetes Son   . Colon cancer Neg Hx   . Esophageal cancer Neg Hx   . Stomach cancer Neg Hx   . Rectal cancer Neg Hx   . Peripheral vascular disease Father     with amputation of both legs  . Hypertension Father   . Heart disease Brother   . Kidney disease Daughter    Social History  Substance Use Topics  . Smoking status: Never Smoker   . Smokeless tobacco: Never Used  . Alcohol Use: No   OB History    No data available      Review of Systems  All other systems reviewed and are negative.     Allergies  Aleve; Clonidine derivatives; and Shellfish allergy  Home Medications   Prior to Admission medications   Medication Sig Start Date End Date Taking? Authorizing Provider  acetaminophen (TYLENOL) 500 MG tablet Take 1,000 mg by mouth every 6 (six) hours as needed for mild pain.   Yes Historical Provider, MD  amLODipine (NORVASC) 10 MG tablet Take 1 tablet (10 mg total) by mouth every evening. 08/24/15  Yes Claretta Fraise, MD  atorvastatin (LIPITOR) 20 MG tablet Take 1 tablet (20 mg total) by mouth every evening. 08/24/15  Yes Claretta Fraise, MD  ferrous fumarate (HEMOCYTE - 106 MG FE) 325 (106 FE) MG TABS tablet Take 1 tablet (106 mg of iron total) by mouth 2 (two) times daily. 08/17/15  Yes Claretta Fraise, MD  metFORMIN (GLUCOPHAGE-XR) 500 MG 24 hr tablet TAKE ONE TABLET BY MOUTH ONCE DAILY WITH BREAKFAST 02/25/16  Yes Claretta Fraise, MD  Nebivolol HCl (BYSTOLIC) 20 MG TABS Take 1 tablet (20 mg total) by mouth every evening. 07/30/15  Yes Claretta Fraise, MD  olmesartan-hydrochlorothiazide (BENICAR HCT) 40-12.5 MG tablet Take 1 tablet by mouth daily.   Yes Historical Provider, MD  oxyCODONE (OXY IR/ROXICODONE) 5 MG immediate release tablet Take 5 mg by mouth every 6 (six) hours as needed for moderate pain.  02/27/16  Yes Historical Provider, MD  solifenacin (VESICARE) 5 MG tablet Take 5 mg by mouth daily.   Yes Historical Provider, MD   BP 188/106 mmHg  Pulse 68  Temp(Src) 97.6 F (36.4 C) (Oral)  Resp 15  SpO2 99% Physical Exam  Constitutional: She is oriented to person, place, and time. She appears well-developed and well-nourished. No distress.  HENT:  Head: Normocephalic and atraumatic.  Mouth/Throat: No oropharyngeal exudate.  Eyes: Conjunctivae and EOM are normal. Pupils are equal, round, and reactive to light. Right eye exhibits no discharge. Left eye exhibits no discharge. No scleral icterus.   Cardiovascular: Normal rate, regular rhythm, normal heart sounds and intact distal pulses.  Exam reveals no gallop and no friction rub.   No murmur heard. Pulmonary/Chest: Effort normal and breath sounds normal. No respiratory distress. She has no wheezes. She has no rales. She exhibits no tenderness.  Abdominal: Soft. She exhibits no distension. There is no tenderness. There is no guarding.  Musculoskeletal: Normal range of motion. She exhibits no edema.  Left elbow: Nonpitting edema and erythema surrounding left elbow. Warm to touch. Pain with flexion and extension. No streaking. No fluctuance.    Neurological: She is alert and oriented to person, place, and time.  Left hand grip strength 3/5. Left lower extremity strength 3/5. Strength 4/5  in right upper and lower extremities. No facial droop or slurred speech. No pronator drift. Dysmetria difficult to assess due to pain in left elbow as well as arthritic pain in right shoulder making it difficult for patient to lift her arm. Patient unable to ambulate due to left lower extremity weakness.  Skin: Skin is warm and dry. No rash noted. She is not diaphoretic. No erythema. No pallor.  Psychiatric: She has a normal mood and affect. Her behavior is normal.  Nursing note and vitals reviewed.   ED Course  Procedures (including critical care time) Labs Review Labs Reviewed  PROTIME-INR - Abnormal; Notable for the following:    Prothrombin Time 15.7 (*)    All other components within normal limits  CBC - Abnormal; Notable for the following:    RBC 5.20 (*)    MCV 77.1 (*)    MCH 24.8 (*)    RDW 16.2 (*)    All other components within normal limits  DIFFERENTIAL - Abnormal; Notable for the following:    Monocytes Absolute 1.3 (*)    All other components within normal limits  COMPREHENSIVE METABOLIC PANEL - Abnormal; Notable for the following:    Sodium 134 (*)    Chloride 95 (*)    Glucose, Bld 163 (*)    Creatinine, Ser 1.33 (*)     Total Protein 8.4 (*)    ALT 12 (*)    Total Bilirubin 1.4 (*)    GFR calc non Af Amer 39 (*)    GFR calc Af Amer 45 (*)    All other components within normal limits  I-STAT TROPOININ, ED - Abnormal; Notable for the following:    Troponin i, poc 0.09 (*)    All other components within normal limits  I-STAT CHEM 8, ED - Abnormal; Notable for the following:    Sodium 134 (*)    Chloride 93 (*)    Creatinine, Ser 1.20 (*)    Glucose, Bld 158 (*)    Calcium, Ion 1.10 (*)    Hemoglobin 15.6 (*)    All other components within normal limits  APTT    Imaging Review Ct Head Wo Contrast  02/28/2016  CLINICAL DATA:  Left side weakness. Loss of left hand grip strength with onset 2 days ago. EXAM: CT HEAD WITHOUT CONTRAST TECHNIQUE: Contiguous axial images were obtained from the base of the skull through the vertex without intravenous contrast. COMPARISON:  None available FINDINGS: Moderate diffuse atrophy. Low attenuation in the deep white matter. More prominent low attenuation in the region of the right basal ganglia does and right thalamus focally. No hemorrhage or extra-axial fluid. No hydrocephalus. Calvarium intact. Extensive atherosclerotic calcification of the intracranial vessels. IMPRESSION: Chronic involutional change with smaller foci of low attenuation right basal ganglia and right thalamus. Subacute lacunar infarcts suspected. Further evaluation with MRI is recommended. Electronically Signed   By: Skipper Cliche M.D.   On: 02/28/2016 18:20   I have personally reviewed and evaluated these images and lab results as part of my medical decision-making.   EKG Interpretation   Date/Time:  Thursday February 28 2016 14:56:14 EDT Ventricular Rate:  68 PR Interval:  196 QRS Duration: 88 QT Interval:  446 QTC Calculation: 474 R Axis:   91 Text Interpretation:  Normal sinus rhythm Left atrial enlargement  Rightward axis Abnormal ECG Confirmed by Wilson Singer  MD, STEPHEN ([id number]) on  02/28/2016  4:28:18 PM      MDM   Final diagnoses:  Lacunar  infarction [facility])  CKD (chronic kidney disease), stage III    70 year old female with history of CVA with residual left lower extremity weakness presents to the emergency room today complaining of increased weakness and left hand and left lower extremity over the last couple of days, significantly worse when she woke up this morning. On exam patient has decreased grip strength in her left hand, 3/5 and 3/5 strength in left lower extremity. No sensory deficits. Patient is also having a gouty flare of her left elbow which is very painful for her. Difficult to assess whether or not patient has dysmetria can as she cannot flex and extend her left elbow with ease. Patient is also hypertensive on presentation, BP is 178/101. Patient states she has had difficulty controlling her BP for the last several months despite medication. Concern for new stroke or reactivation of previous stroke. We'll obtain stroke panel blood work and CT head.   Initial troponin elevated to 0.09. No chest pain or shortness of breath. EKG unremarkable. Will repeat troponin 3 hours. Creatinine elevated at 1.33 which is double from previous examination. CT head reveals chronic involutional change with smaller foci of low attenuation right basal ganglia and right thalamus. Subacute lacunar infarcts suspected. I spoke with neurology, Dr. Nicole Kindred who recommends MRI brain and MRA head as well as hospitalist admission. Repeat troponin is 0.09 which is unchanged from initial lab value. Spoke with hospitalist, Dr. Blaine Hamper who will admit patient to their service.   Case discussed with Dr. Wilson Singer who agrees with treatment plan.    Dondra Spry [place], PA-C 02/28/16 1948  Virgel Manifold, MD 02/29/16 [phone]

## 2016-02-29 ENCOUNTER — Inpatient Hospital Stay (HOSPITAL_COMMUNITY): Payer: Medicare Other

## 2016-02-29 ENCOUNTER — Ambulatory Visit: Payer: Medicare Other | Admitting: Family Medicine

## 2016-02-29 DIAGNOSIS — I161 Hypertensive emergency: Principal | ICD-10-CM

## 2016-02-29 DIAGNOSIS — G5632 Lesion of radial nerve, left upper limb: Secondary | ICD-10-CM

## 2016-02-29 DIAGNOSIS — I6789 Other cerebrovascular disease: Secondary | ICD-10-CM

## 2016-02-29 LAB — GLUCOSE, CAPILLARY
GLUCOSE-CAPILLARY: 88 mg/dL (ref 65–99)
GLUCOSE-CAPILLARY: 101 mg/dL — AB (ref 65–99)
GLUCOSE-CAPILLARY: 118 mg/dL — AB (ref 65–99)
Glucose-Capillary: 155 mg/dL — ABNORMAL HIGH (ref 65–99)

## 2016-02-29 LAB — BASIC METABOLIC PANEL
Anion gap: 9 (ref 5–15)
BUN: 16 mg/dL (ref 6–20)
CHLORIDE: 100 mmol/L — AB (ref 101–111)
CO2: 26 mmol/L (ref 22–32)
CREATININE: 1.01 mg/dL — AB (ref 0.44–1.00)
Calcium: 8.7 mg/dL — ABNORMAL LOW (ref 8.9–10.3)
GFR, EST NON AFRICAN AMERICAN: 54 mL/min — AB (ref >60)
Glucose, Bld: 110 mg/dL — ABNORMAL HIGH (ref 65–99)
POTASSIUM: 3.6 mmol/L (ref 3.5–5.1)
SODIUM: 135 mmol/L (ref 135–145)

## 2016-02-29 LAB — TROPONIN I
Troponin I: 0.1 ng/mL — ABNORMAL HIGH (ref <0.031)
Troponin I: 0.1 ng/mL — ABNORMAL HIGH (ref <0.031)

## 2016-02-29 LAB — LIPID PANEL
CHOL/HDL RATIO: 2.3 ratio
CHOLESTEROL: 138 mg/dL (ref 0–200)
HDL: 60 mg/dL (ref >40)
LDL CALC: 71 mg/dL (ref 0–99)
TRIGLYCERIDES: 34 mg/dL (ref <150)
VLDL: 7 mg/dL (ref 0–40)

## 2016-02-29 MED ORDER — GADOBENATE DIMEGLUMINE 529 MG/ML IV SOLN
20.0000 mL | Freq: Once | INTRAVENOUS | Status: AC | PRN
  Administered 2016-02-29: 20 mL via INTRAVENOUS

## 2016-02-29 MED ORDER — HYDRALAZINE HCL 25 MG PO TABS
25.0000 mg | ORAL_TABLET | Freq: Two times a day (BID) | ORAL | Status: DC
  Administered 2016-02-29 – 2016-03-01 (×3): 25 mg via ORAL
  Filled 2016-02-29 (×3): qty 1

## 2016-02-29 NOTE — Care Management Note (Signed)
Case Management Note  Patient Details  Name: MONIYAH CANNEY MRN: [id number] Date of Birth: 1946-09-19  Subjective/Objective:  Pt admitted for Hypertensive Emergency. Pt is from home alone. Pt has support of son and neighbors. Pt was going to outpatient therapy in [place] before admission.                   Action/Plan: PT/ OT to consult for disposition needs. CM will continue to monitor.    Expected Discharge Date:                  Expected Discharge Plan:  [place]  In-House Referral:  Clinical Social Work  Discharge planning Services  CM Consult  Post Acute Care Choice:    Choice offered to:     DME Arranged:    DME Agency:     HH Arranged:    [place] Agency:     Status of Service:  In process, will continue to follow  Medicare Important Message Given:    Date Medicare IM Given:    Medicare IM give by:    Date Additional Medicare IM Given:    Additional Medicare Important Message give by:     If discussed at [place] of Stay Meetings, dates discussed:    Additional Comments:  Bethena Roys, RN 02/29/2016, 3:22 PM

## 2016-02-29 NOTE — Evaluation (Addendum)
Physical Therapy Evaluation Patient Details Name: Beth [name] MRN: [id number] DOB: 07-21-1946 Today's Date: 02/29/2016   History of Present Illness  Patient is a 70 yo female admitted 02/28/16 with increase in Lt UE/LE weakness to r/o TIA/CVA.  MRI negative.  Patient with Lt wrist drop (new splint), and gout flare in Lt elbow.   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  Will benefit from acute PT to maximize functional independence prior to discharge home. Encouraged patient to use her w/c initially at home for safety.   Recommend patient continue OP PT in [place], and include functional mobility/LUE.    Follow Up Recommendations Outpatient PT;Supervision - Intermittent (Patient goes to OP PT in [place] for Lt knee)    Equipment Recommendations  None recommended by PT    Recommendations for Other Services       Precautions / Restrictions Precautions Precautions: Fall Restrictions Weight Bearing Restrictions: No      Mobility  Bed Mobility Overal bed mobility: Needs Assistance Bed Mobility: Supine to Sit;Sit to Supine     Supine to sit: Min guard Sit to supine: Min guard   General bed mobility comments: Assist for safety only.  Transfers Overall transfer level: Needs assistance Equipment used: Rolling walker (2 wheeled) Transfers: Sit to/from Stand Sit to Stand: Min assist;From elevated surface         General transfer comment: Verbal cues for hand placement.  Patient able to scoot to edge of bed.  Assist to power up to standing and for balance.  Ambulation/Gait Ambulation/Gait assistance: Min assist Ambulation Distance (Feet): 14 Feet Assistive device: Rolling walker (2 wheeled) Gait Pattern/deviations: Step-to pattern;Decreased step length - right;Decreased step length - left;Decreased stance time - right;Shuffle Gait velocity: decreased Gait velocity  interpretation: Below normal speed for age/gender General Gait Details: Patient with step-to antalgic gait pattern.  Rt LE remains flexed at knee during stance.  Shuffle steps.  Difficulty using RW to off-load LE's due to pain Lt elbow and splint Lt wrist.  Stairs            Wheelchair Mobility    Modified Rankin (Stroke Patients Only)       Balance Overall balance assessment: Needs assistance         Standing balance support: Bilateral upper extremity supported Standing balance-[name]: Poor                               Pertinent Vitals/Pain Pain Assessment: 0-10 Pain Score: 5  Pain Location: Lt elbow Pain Descriptors / Indicators: Aching Pain Intervention(s): Limited activity within patient's tolerance;Monitored during session;Repositioned    Home Living Family/patient expects to be discharged to:: Private residence Living Arrangements: Alone Available Help at Discharge: Family;Neighbor;Available PRN/intermittently Type of Home: House Home Access: Ramped entrance     Home Layout: One level Home Equipment: Walker - 2 wheels;Cane - single point;Bedside commode;Shower seat;Wheelchair - manual (Lift chair)      Prior Function Level of Independence: Independent with assistive device(s)         Comments: Patient uses RW for gait     Hand Dominance        Extremity/Trunk Assessment   Upper Extremity Assessment: Generalized weakness;RUE deficits/detail;LUE deficits/detail RUE Deficits / Details: Decreased shoulder ROM to < 90* flexion     LUE Deficits /  Details: Decreased wrist extension, in wrist brace.  Decreased strength/ROM Lt elbow due to gout flare   Lower Extremity Assessment: Generalized weakness;RLE deficits/detail;LLE deficits/detail RLE Deficits / Details: Decreased knee ROM - decreased extension.  Edema noted Rt knee. LLE Deficits / Details: Strength grossly 4-/5     Communication   Communication: No difficulties   Cognition Arousal/Alertness: Awake/alert Behavior During Therapy: WFL for tasks assessed/performed Overall Cognitive Status: Within Functional Limits for tasks assessed                      General Comments      Exercises        Assessment/Plan    PT Assessment Patient needs continued PT services  PT Diagnosis Difficulty walking;Generalized weakness;Acute pain   PT Problem List Decreased strength;Decreased range of motion;Decreased activity tolerance;Decreased balance;Decreased mobility;Cardiopulmonary status limiting activity;Impaired sensation;Obesity;Pain  PT Treatment Interventions DME instruction;Gait training;Functional mobility training;Therapeutic activities;Therapeutic exercise;Patient/family education   PT Goals (Current goals can be found in the Care Plan section) Acute Rehab PT Goals Patient Stated Goal: To decrease pain.  To go home PT Goal Formulation: With patient Time For Goal Achievement: 03/07/16 Potential to Achieve Goals: Good    Frequency Min 3X/week   Barriers to discharge Decreased caregiver support Patient lives alone.    Co-evaluation               End of Session Equipment Utilized During Treatment: Gait belt Activity Tolerance: Patient tolerated treatment well;Patient limited by pain Patient left: in bed;with call bell/phone within reach;with bed alarm set;with family/visitor present           Time: [id number] PT Time Calculation (min) (ACUTE ONLY): 24 min   Charges:   PT Evaluation $PT Eval Moderate Complexity: 1 Procedure PT Treatments $Gait Training: 8-22 mins   PT G Codes:        Despina Pole 03-29-16, 7:29 PM Carita Pian. Sanjuana Kava, [place] Pager [phone]

## 2016-02-29 NOTE — Progress Notes (Signed)
PROGRESS NOTE  Beth [name] [id number] DOB: Jan 21, 1946 DOA: 02/28/2016 PCP: Claretta Fraise, MD Outpatient Specialists:    LOS: 1 day   Brief Narrative: Beth [name] is a 70 y.o. female with PMH of stroke with left-sided weakness, left elbow gout, hypertension, hyperlipidemia, prediabetes, chronic kidney disease-stage III, who presents with left elbow pain, worsening left-sided weakness.  Assessment & Plan: Principal Problem:   Hypertensive emergency Active Problems:   HLD (hyperlipidemia)   HTN (hypertension)   Pre-diabetes   Hyperlipidemia   Gout   Stroke ([place])   Acute renal failure superimposed on stage 3 chronic kidney disease (HCC)   Elevated troponin   Hypertensive emergency  - Patient has elevated blood pressure at 210/115, worsening renal function  - BP improved this morning, added hydralazine - continue Amlodipine, nebivolol - patient unsure whether she takes Benicar but would hold due to worsening renal function  Hx of stroke  - MRI and MRA of brain are negative - appreciate neuro input - MRI C spine pending to evaluate left hand weakness  Elevate troponin - flat trend. Likely due to demanding ischemia secondary to hypertensive emergency. - Blood pressure control as above - 2-D echo - Continue Lipitor and nebivolol - No aspirin since patient is allergic  Gout flare up in left elbow:  - continue colchicine; improving  HLD - Last LDL was 81 on 07/30/15 - Continue home medications: Lipitor  Pre-diabetes - Last A1c 6.3 on 11/19/15, well controled. Patient is taking metformin at home - SSI  Acute renal failure superimposed on stage 3 chronic kidney disease [facility]): Baseline Cre is about 1.0, her Cre is 1.33 on admission. Improved overnight with hydration   DVT prophylaxis: Lovenox Code Status: Full code Family Communication: d/w son bedside Disposition Plan: home when ready Barriers for discharge: BP control, MRI C spine, neuro  evaluation  Consultants:   Neurology   Procedures:   2D echo: pending  Antimicrobials:  None    Subjective: - ongoing left hand weakness. Elbow pain improving  Objective: Filed Vitals:   02/28/16 2250 02/28/16 2326 02/29/16 0400 02/29/16 0541  BP: 172/86 177/82 135/73   Pulse: 69 71 64   Temp:  98.2 F (36.8 C) 97.9 F (36.6 C)   TempSrc:  Oral Oral   Resp: 19 19 17    Height:  5\' 6"  (1.676 m)    Weight:  87.317 kg (192 lb 8 oz) 86.592 kg (190 lb 14.4 oz) 86.592 kg (190 lb 14.4 oz)  SpO2: 96% 96% 97%     Intake/Output Summary (Last 24 hours) at 02/29/16 1144 Last data filed at 02/29/16 0921  Gross per 24 hour  Intake 733.21 ml  Output    150 ml  Net 583.21 ml   Filed Weights   02/28/16 2326 02/29/16 0400 02/29/16 0541  Weight: 87.317 kg (192 lb 8 oz) 86.592 kg (190 lb 14.4 oz) 86.592 kg (190 lb 14.4 oz)    Examination: BP 135/73 mmHg  Pulse 64  Temp(Src) 97.9 F (36.6 C) (Oral)  Resp 17  Ht 5\' 6"  (1.676 m)  Wt 86.592 kg (190 lb 14.4 oz)  BMI 30.83 kg/m2  SpO2 97%  GENERAL: NAD  HEENT: head NCAT, no scleral icterus. Pupils round and reactive. Mucous membranes are moist. Posterior pharynx clear of any exudate or lesions.  NECK: Supple. No carotid bruits. No lymphadenopathy or thyromegaly.  LUNGS: Clear to auscultation. No wheezing or crackles  HEART: Regular rate and rhythm without murmur. 2+ pulses, no JVD, no  peripheral edema  ABDOMEN: Soft, nontender, and nondistended. Positive bowel sounds.   EXTREMITIES: Without any cyanosis or clubbing  NEUROLOGIC: Alert and oriented x3. Weak grip left hand, 4/4 otherwise  PSYCHIATRIC: Normal mood and affect.     Data Reviewed: I have personally reviewed following labs and imaging studies  CBC:  Recent Labs Lab 02/28/16 1541 02/28/16 1557  WBC 10.5  --   NEUTROABS 7.0  --   HGB 12.9 15.6*  HCT 40.1 46.0  MCV 77.1*  --   PLT 344  --    Basic Metabolic Panel:  Recent Labs Lab 02/28/16 1541  02/28/16 1557 02/29/16 0748  NA 134* 134* 135  K 3.9 3.7 3.6  CL 95* 93* 100*  CO2 29  --  26  GLUCOSE 163* 158* 110*  BUN 13 15 16   CREATININE 1.33* 1.20* 1.01*  CALCIUM 9.4  --  8.7*   GFR: Estimated Creatinine Clearance: 55.8 mL/min (by C-G formula based on Cr of 1.01). Liver Function Tests:  Recent Labs Lab 02/28/16 1541  AST 19  ALT 12*  ALKPHOS 111  BILITOT 1.4*  PROT 8.4*  ALBUMIN 3.5   No results for input(s): LIPASE, AMYLASE in the last 168 hours. No results for input(s): AMMONIA in the last 168 hours. Coagulation Profile:  Recent Labs Lab 02/28/16 1541  INR 1.23   Cardiac Enzymes:  Recent Labs Lab 02/28/16 2130 02/29/16 0224 02/29/16 0748  TROPONINI 0.11* 0.10* 0.10*   BNP (last 3 results) No results for input(s): PROBNP in the last 8760 hours. HbA1C: No results for input(s): HGBA1C in the last 72 hours. CBG:  Recent Labs Lab 02/28/16 2333 02/29/16 0731  GLUCAP 209* 88   Lipid Profile:  Recent Labs  02/29/16 0224  CHOL 138  HDL 60  LDLCALC 71  TRIG 34  CHOLHDL 2.3   Thyroid Function Tests: No results for input(s): TSH, T4TOTAL, FREET4, T3FREE, THYROIDAB in the last 72 hours. Anemia Panel: No results for input(s): VITAMINB12, FOLATE, FERRITIN, TIBC, IRON, RETICCTPCT in the last 72 hours. Urine analysis:    Component Value Date/Time   COLORURINE YELLOW 04/30/2015 1400   APPEARANCEUR CLOUDY* 04/30/2015 1400   LABSPEC 1.016 04/30/2015 1400   PHURINE 6.5 04/30/2015 1400   GLUCOSEU NEGATIVE 04/30/2015 1400   HGBUR NEGATIVE 04/30/2015 1400   BILIRUBINUR NEGATIVE 04/30/2015 1400   KETONESUR NEGATIVE 04/30/2015 1400   PROTEINUR NEGATIVE 04/30/2015 1400   UROBILINOGEN 1.0 04/30/2015 1400   NITRITE NEGATIVE 04/30/2015 1400   LEUKOCYTESUR NEGATIVE 04/30/2015 1400   Sepsis Labs: Invalid input(s): PROCALCITONIN, LACTICIDVEN  No results found for this or any previous visit (from the past 240 hour(s)).    Radiology Studies: Ct  Head Wo Contrast  02/28/2016  CLINICAL DATA:  Left side weakness. Loss of left hand grip strength with onset 2 days ago. EXAM: CT HEAD WITHOUT CONTRAST TECHNIQUE: Contiguous axial images were obtained from the base of the skull through the vertex without intravenous contrast. COMPARISON:  None available FINDINGS: Moderate diffuse atrophy. Low attenuation in the deep white matter. More prominent low attenuation in the region of the right basal ganglia does and right thalamus focally. No hemorrhage or extra-axial fluid. No hydrocephalus. Calvarium intact. Extensive atherosclerotic calcification of the intracranial vessels. IMPRESSION: Chronic involutional change with smaller foci of low attenuation right basal ganglia and right thalamus. Subacute lacunar infarcts suspected. Further evaluation with MRI is recommended. Electronically Signed   By: Skipper Cliche M.D.   On: 02/28/2016 18:20   Mr Angiogram Head  Wo Contrast  02/28/2016  CLINICAL DATA:  Stroke. Left-sided weakness. Hypertension and hyperlipidemia. Chronic kidney disease. EXAM: MRI HEAD WITHOUT CONTRAST MRA HEAD WITHOUT CONTRAST TECHNIQUE: Multiplanar, multiecho pulse sequences of the brain and surrounding structures were obtained without intravenous contrast. Angiographic images of the head were obtained using MRA technique without contrast. COMPARISON:  CT head 02/28/2016 FINDINGS: MRI HEAD FINDINGS Negative for acute infarct. Moderate atrophy. Advanced chronic ischemic changes. Chronic microvascular ischemic change throughout the white matter and basal ganglia. Chronic infarcts in the thalamus bilaterally. Chronic ischemia in the pons left greater than right. Numerous areas of chronic micro hemorrhage in the brain. These are present in the cerebral hemispheres, basal ganglia, and cerebellum bilaterally. Findings suggest poorly controlled hypertension versus cerebral amyloid. Negative for mass lesion.  No shift of the midline structures. Pituitary  normal in size. Normal skullbase. Mild mucosal edema in the paranasal sinuses. No orbital mass. MRA HEAD FINDINGS Both vertebral arteries patent to the basilar. Left PICA patent. Right PICA not visualized. Basilar widely patent. Superior cerebellar and posterior cerebral arteries patent without significant stenosis. Fetal origin right posterior cerebral artery. Internal carotid artery widely patent without significant stenosis. Anterior and middle cerebral arteries patent bilaterally without significant stenosis Negative for cerebral aneurysm. IMPRESSION: Negative for acute infarct Advanced chronic microvascular ischemia. Advance multifocal chronic microhemorrhage most consistent with poorly controlled hypertension Negative MRA head Electronically Signed   By: Franchot Gallo M.D.   On: 02/28/2016 21:02   Mr Brain Wo Contrast  02/28/2016  CLINICAL DATA:  Stroke. Left-sided weakness. Hypertension and hyperlipidemia. Chronic kidney disease. EXAM: MRI HEAD WITHOUT CONTRAST MRA HEAD WITHOUT CONTRAST TECHNIQUE: Multiplanar, multiecho pulse sequences of the brain and surrounding structures were obtained without intravenous contrast. Angiographic images of the head were obtained using MRA technique without contrast. COMPARISON:  CT head 02/28/2016 FINDINGS: MRI HEAD FINDINGS Negative for acute infarct. Moderate atrophy. Advanced chronic ischemic changes. Chronic microvascular ischemic change throughout the white matter and basal ganglia. Chronic infarcts in the thalamus bilaterally. Chronic ischemia in the pons left greater than right. Numerous areas of chronic micro hemorrhage in the brain. These are present in the cerebral hemispheres, basal ganglia, and cerebellum bilaterally. Findings suggest poorly controlled hypertension versus cerebral amyloid. Negative for mass lesion.  No shift of the midline structures. Pituitary normal in size. Normal skullbase. Mild mucosal edema in the paranasal sinuses. No orbital mass. MRA  HEAD FINDINGS Both vertebral arteries patent to the basilar. Left PICA patent. Right PICA not visualized. Basilar widely patent. Superior cerebellar and posterior cerebral arteries patent without significant stenosis. Fetal origin right posterior cerebral artery. Internal carotid artery widely patent without significant stenosis. Anterior and middle cerebral arteries patent bilaterally without significant stenosis Negative for cerebral aneurysm. IMPRESSION: Negative for acute infarct Advanced chronic microvascular ischemia. Advance multifocal chronic microhemorrhage most consistent with poorly controlled hypertension Negative MRA head Electronically Signed   By: Franchot Gallo M.D.   On: 02/28/2016 21:02     Scheduled Meds: .  stroke: mapping our early stages of recovery book   Does not apply Once  . amLODipine  10 mg Oral QPM  . atorvastatin  20 mg Oral QPM  . colchicine  0.6 mg Oral BID  . darifenacin  7.5 mg Oral Daily  . enoxaparin (LOVENOX) injection  40 mg Subcutaneous Q24H  . feeding supplement (ENSURE ENLIVE)  237 mL Oral BID BM  . Ferrous Fumarate  1 tablet Oral BID  . hydrALAZINE  25 mg Oral Q12H  . insulin  aspart  0-5 Units Subcutaneous QHS  . insulin aspart  0-9 Units Subcutaneous TID WC  . nebivolol  20 mg Oral QPM   Continuous Infusions: . sodium chloride 75 mL/hr at 02/28/16 2332    Marzetta Board, MD, PhD [facility] Pager [phone]  If 7PM-7AM, please contact night-coverage [email or web address] Password [facility] 02/29/2016, 11:44 AM

## 2016-02-29 NOTE — Progress Notes (Signed)
  Echocardiogram 2D Echocardiogram has been performed.  Beth [name] 02/29/2016, 5:07 PM

## 2016-02-29 NOTE — Progress Notes (Signed)
Orthopedic Tech Progress Note Patient Details:  Beth [name] Sep 16, 1946 [id number]  Ortho Devices Type of Ortho Device: Velcro wrist splint Ortho Device/Splint Location: lue Ortho Device/Splint Interventions: Application   Prince Olivier 02/29/2016, 9:01 AM

## 2016-02-29 NOTE — Plan of Care (Signed)
Problem: Education: Goal: Knowledge of [facility] Education information/materials will improve Outcome: Progressing Patient aware of plan of care.  Patient did complain of generalized pain (see flow sheet for pain assessment).  Pain medication options discussed with patient.  Tylenol given per patient request.  Patient asleep at reassessment.

## 2016-02-29 NOTE — Consult Note (Signed)
Admission H&P    Chief Complaint: New onset left-sided weakness.  HPI: Beth [name] is an 70 y.o. female history of hypertension, previous stroke, hyperlipidemia and diabetes mellitus presenting with weakness involving the left side as well as numbness. She also had a marked elevation in blood pressure. Weakness began 3 days ago involving her left upper extremity. She also had tenderness involving her left elbow which she attributed to gout. She had increasing difficulty bearing weight on the left lower extremity. She noticed numbness in her left leg and arm. No change in speech was noted. CT scan of her head showed no acute intracranial abnormality. MRI of the brain also showed no signs of an acute infarction. MRA angiogram was unremarkable.  Past Medical History  Diagnosis Date  . Hypertension   . CVA (cerebral infarction)     2006  . Hyperlipidemia   . Blood transfusion without reported diagnosis 2012    anemia;pt denies transfusion stated was only on iron tablet  . Heart murmur   . Nocturia     3-4 times per night  . Gout     left elbow  . Arthritis     Knee both knees  . Anemia   . 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 08-09-14 Right eye  . Family history of anesthesia complication     sister very slow to awaken after anesthesia;severe vomiting   . Stroke ([place]) 2006    x 1 no deficits noted   . Cataract     left  . CKD (chronic kidney disease), stage III     Past Surgical History  Procedure Laterality Date  . Carpal tunnel release Right 1983  . Abdominal hysterectomy  1983  . Colonscopy  June 21, 2014  . Nasal cauterization  2012  . Total knee arthroplasty Right 07/03/2014    Procedure: RIGHT TOTAL KNEE ARTHROPLASTY;  Surgeon: Gearlean Alf, MD;  Location: [facility];  Service: Orthopedics;  Laterality: Right;  . Patellar tendon repair Right 08/11/2014    Procedure: RIGHT PATELLA TENDON REPAIR;  Surgeon: Gearlean Alf, MD;  Location: [facility];  Service: Orthopedics;   Laterality: Right;  . Joint replacement  06/2014    right knee  . I&d knee with poly exchange Right 10/02/2014    Procedure: IRRIGATION AND DEBRIDEMENT RIGHT KNEE WITH POLY EXCHANGE;  Surgeon: Gearlean Alf, MD;  Location: [facility];  Service: Orthopedics;  Laterality: Right;  . Excisional total knee arthroplasty with antibiotic spacers Right 02/16/2015    Procedure: RIGHT KNEE RESECTION ARTHROPLASTY WITH ANTIBIOTIC SPACERS;  Surgeon: [name], MD;  Location: [facility];  Service: Orthopedics;  Laterality: Right;  . Reimplantation of total knee Right 05/23/2015    Procedure: RIGHT KNEE ARTHROPLASTY REIMPLANTATION;  Surgeon: [name], MD;  Location: [facility];  Service: Orthopedics;  Laterality: Right;    Family History  Problem Relation Age of Onset  . Ovarian cancer Mother   . Diabetes Son   . Diabetes Son   . Colon cancer Neg Hx   . Esophageal cancer Neg Hx   . Stomach cancer Neg Hx   . Rectal cancer Neg Hx   . Peripheral vascular disease Father     with amputation of both legs  . Hypertension Father   . Heart disease Brother   . Kidney disease Daughter    Social History:  reports that she has never smoked. She has never used smokeless tobacco. She reports that she does not drink  alcohol or use illicit drugs.  Allergies:  Allergies  Allergen Reactions  . Asa [Aspirin]     Nose bleeding  . Aleve [Naproxen Sodium]     Heart races  . Clonidine Derivatives Other (See Comments)    Dizziness and weakness  . Shellfish Allergy Nausea And Vomiting    Medications Prior to Admission  Medication Sig Dispense Refill  . acetaminophen (TYLENOL) 500 MG tablet Take 1,000 mg by mouth every 6 (six) hours as needed for mild pain.    Marland Kitchen amLODipine (NORVASC) 10 MG tablet Take 1 tablet (10 mg total) by mouth every evening. 90 tablet 1  . atorvastatin (LIPITOR) 20 MG tablet Take 1 tablet (20 mg total) by mouth every evening. 90 tablet 1  . ferrous fumarate (HEMOCYTE - 106 MG FE) 325 (106 FE) MG  TABS tablet Take 1 tablet (106 mg of iron total) by mouth 2 (two) times daily. 60 each 5  . metFORMIN (GLUCOPHAGE-XR) 500 MG 24 hr tablet TAKE ONE TABLET BY MOUTH ONCE DAILY WITH BREAKFAST 90 tablet 0  . Nebivolol HCl (BYSTOLIC) 20 MG TABS Take 1 tablet (20 mg total) by mouth every evening. 90 tablet 1  . olmesartan-hydrochlorothiazide (BENICAR HCT) 40-12.5 MG tablet Take 1 tablet by mouth daily.    Marland Kitchen oxyCODONE (OXY IR/ROXICODONE) 5 MG immediate release tablet Take 5 mg by mouth every 6 (six) hours as needed for moderate pain.     Marland Kitchen solifenacin (VESICARE) 5 MG tablet Take 5 mg by mouth daily.      ROS: History obtained from patient.  General ROS: negative for - chills, fatigue, fever, night sweats, weight gain or weight loss Psychological ROS: negative for - behavioral disorder, hallucinations, memory difficulties, mood swings or suicidal ideation Ophthalmic ROS: negative for - blurry vision, double vision, eye pain or loss of vision ENT ROS: negative for - epistaxis, nasal discharge, oral lesions, sore throat, tinnitus or vertigo Allergy and Immunology ROS: negative for - hives or itchy/watery eyes Hematological and Lymphatic ROS: negative for - bleeding problems, bruising or swollen lymph nodes Endocrine ROS: negative for - galactorrhea, hair pattern changes, polydipsia/polyuria or temperature intolerance Respiratory ROS: negative for - cough, hemoptysis, shortness of breath or wheezing Cardiovascular ROS: negative for - chest pain, dyspnea on exertion, edema or irregular heartbeat Gastrointestinal ROS: negative for - abdominal pain, diarrhea, hematemesis, nausea/vomiting or stool incontinence Genito-Urinary ROS: negative for - dysuria, hematuria, incontinence or urinary frequency/urgency Musculoskeletal ROS: negative for - joint swelling or muscular weakness Neurological ROS: as noted in HPI Dermatological ROS: negative for rash and skin lesion changes  Physical Examination: Blood  pressure 177/82, pulse 71, temperature 98.2 F (36.8 C), temperature source Oral, resp. rate 19, height _0  (1.676 m), weight 87.317 kg (192 lb 8 oz), SpO2 96 %.  HEENT-  Normocephalic, no lesions, without obvious abnormality.  Normal external eye and conjunctiva.  Normal TM's bilaterally.  Normal auditory canals and external ears. Normal external nose, mucus membranes and septum.  Normal pharynx. Neck supple with no masses, nodes, nodules or enlargement. Cardiovascular - regular rate and rhythm, S1, S2 normal, no murmur, click, rub or gallop Lungs - chest clear, no wheezing, rales, normal symmetric air entry Abdomen - soft, non-tender; bowel sounds normal; no masses,  no organomegaly Extremities - moderate swelling of right knee with tenderness  Neurologic Examination: Mental Status: Alert, oriented, thought content appropriate.  Speech fluent without evidence of aphasia. Able to follow commands without difficulty. Cranial Nerves: II-Visual fields were normal. III/IV/VI-Pupils  were equal and reacted normally to light. Extraocular movements were full and conjugate.    V/VII-no facial numbness and no facial weakness. VIII-normal. X-normal speech and symmetrical palatal movement. XI: trapezius strength/neck flexion strength normal bilaterally XII-midline tongue extension with normal strength. Motor: Right wrist drop marked weakness of extension of wrist as well as fingers proximally; normal strength of median and ulnar innervated intrinsic hand muscles. Strength of left triceps was normal. She also had normal strength of left lower extremity proximally and distally. The right extremities was normal except for equivocal weakness of right quadriceps due to tenderness with movement of right knee. Sensory: Reduced perception of tactile sensation of left upper and lower extremities compared to right extremities. Deep Tendon Reflexes: Trace to 1+ and symmetric. Plantars: Mute  bilaterally Cerebellar: Normal finger-to-nose testing use of right upper extremity. Carotid auscultation: Normal  Results for orders placed or performed during the hospital encounter of 02/28/16 (from the past 48 hour(s))  Protime-INR     Status: Abnormal   Collection Time: 02/28/16  3:41 PM  Result Value Ref Range   Prothrombin Time 15.7 (H) 11.6 - 15.2 seconds   INR 1.23 0.00 - 1.49  APTT     Status: None   Collection Time: 02/28/16  3:41 PM  Result Value Ref Range   aPTT 33 24 - 37 seconds  CBC     Status: Abnormal   Collection Time: 02/28/16  3:41 PM  Result Value Ref Range   WBC 10.5 4.0 - 10.5 K/uL   RBC 5.20 (H) 3.87 - 5.11 MIL/uL   Hemoglobin 12.9 12.0 - 15.0 g/dL   HCT 40.1 36.0 - 46.0 %   MCV 77.1 (L) 78.0 - 100.0 fL   MCH 24.8 (L) 26.0 - 34.0 pg   MCHC 32.2 30.0 - 36.0 g/dL   RDW 16.2 (H) 11.5 - 15.5 %   Platelets 344 150 - 400 K/uL  Differential     Status: Abnormal   Collection Time: 02/28/16  3:41 PM  Result Value Ref Range   Neutrophils Relative % 68 %   Neutro Abs 7.0 1.7 - 7.7 K/uL   Lymphocytes Relative 18 %   Lymphs Abs 1.9 0.7 - 4.0 K/uL   Monocytes Relative 12 %   Monocytes Absolute 1.3 (H) 0.1 - 1.0 K/uL   Eosinophils Relative 2 %   Eosinophils Absolute 0.2 0.0 - 0.7 K/uL   Basophils Relative 0 %   Basophils Absolute 0.0 0.0 - 0.1 K/uL  Comprehensive metabolic panel     Status: Abnormal   Collection Time: 02/28/16  3:41 PM  Result Value Ref Range   Sodium 134 (L) 135 - 145 mmol/L   Potassium 3.9 3.5 - 5.1 mmol/L   Chloride 95 (L) 101 - 111 mmol/L   CO2 29 22 - 32 mmol/L   Glucose, Bld 163 (H) 65 - 99 mg/dL   BUN 13 6 - 20 mg/dL   Creatinine, Ser 1.33 (H) 0.44 - 1.00 mg/dL   Calcium 9.4 8.9 - 10.3 mg/dL   Total Protein 8.4 (H) 6.5 - 8.1 g/dL   Albumin 3.5 3.5 - 5.0 g/dL   AST 19 15 - 41 U/L   ALT 12 (L) 14 - 54 U/L   Alkaline Phosphatase 111 38 - 126 U/L   Total Bilirubin 1.4 (H) 0.3 - 1.2 mg/dL   GFR calc non Af Amer 39 (L) >60 mL/min    GFR calc Af Amer 45 (L) >60 mL/min    Comment: (NOTE)  The eGFR has been calculated using the CKD EPI equation. This calculation has not been validated in all clinical situations. eGFR's persistently <60 mL/min signify possible Chronic Kidney Disease.    Anion gap 10 5 - 15  I-stat troponin, ED (not at [facility], [facility])     Status: Abnormal   Collection Time: 02/28/16  3:55 PM  Result Value Ref Range   Troponin i, poc 0.09 (HH) 0.00 - 0.08 ng/mL   Comment NOTIFIED PHYSICIAN    Comment 3            Comment: Due to the release kinetics of cTnI, a negative result within the first hours of the onset of symptoms does not rule out myocardial infarction with certainty. If myocardial infarction is still suspected, repeat the test at appropriate intervals.   I-Stat Chem 8, ED  (not at [facility], [facility])     Status: Abnormal   Collection Time: 02/28/16  3:57 PM  Result Value Ref Range   Sodium 134 (L) 135 - 145 mmol/L   Potassium 3.7 3.5 - 5.1 mmol/L   Chloride 93 (L) 101 - 111 mmol/L   BUN 15 6 - 20 mg/dL   Creatinine, Ser 1.20 (H) 0.44 - 1.00 mg/dL   Glucose, Bld 158 (H) 65 - 99 mg/dL   Calcium, Ion 1.10 (L) 1.13 - 1.30 mmol/L   TCO2 29 0 - 100 mmol/L   Hemoglobin 15.6 (H) 12.0 - 15.0 g/dL   HCT 46.0 36.0 - 46.0 %  I-Stat Troponin, ED (not at [facility])     Status: Abnormal   Collection Time: 02/28/16  7:01 PM  Result Value Ref Range   Troponin i, poc 0.09 (HH) 0.00 - 0.08 ng/mL   Comment NOTIFIED PHYSICIAN    Comment 3            Comment: Due to the release kinetics of cTnI, a negative result within the first hours of the onset of symptoms does not rule out myocardial infarction with certainty. If myocardial infarction is still suspected, repeat the test at appropriate intervals.   Uric acid     Status: None   Collection Time: 02/28/16  9:30 PM  Result Value Ref Range   Uric Acid, Serum 5.8 2.3 - 6.6 mg/dL  Troponin I (q 6hr x 3)     Status: Abnormal   Collection Time: 02/28/16  9:30 PM   Result Value Ref Range   Troponin I 0.11 (H) <0.031 ng/mL    Comment:        PERSISTENTLY INCREASED TROPONIN VALUES IN THE RANGE OF 0.04-0.49 ng/mL CAN BE SEEN IN:       -UNSTABLE ANGINA       -CONGESTIVE HEART FAILURE       -MYOCARDITIS       -CHEST TRAUMA       -ARRYHTHMIAS       -LATE PRESENTING MYOCARDIAL INFARCTION       -COPD   CLINICAL FOLLOW-UP RECOMMENDED.   Creatinine, urine, random     Status: None   Collection Time: 02/28/16 10:30 PM  Result Value Ref Range   Creatinine, Urine 104.99 mg/dL  Urine rapid drug screen (hosp performed)     Status: Abnormal   Collection Time: 02/28/16 10:32 PM  Result Value Ref Range   Opiates POSITIVE (A) NONE DETECTED   Cocaine NONE DETECTED NONE DETECTED   Benzodiazepines NONE DETECTED NONE DETECTED   Amphetamines NONE DETECTED NONE DETECTED   Tetrahydrocannabinol NONE DETECTED NONE DETECTED   Barbiturates  NONE DETECTED NONE DETECTED    Comment:        DRUG SCREEN FOR MEDICAL PURPOSES ONLY.  IF CONFIRMATION IS NEEDED FOR ANY PURPOSE, NOTIFY LAB WITHIN 5 DAYS.        LOWEST DETECTABLE LIMITS FOR URINE DRUG SCREEN Drug Class       Cutoff (ng/mL) Amphetamine      1000 Barbiturate      200 Benzodiazepine   464 Tricyclics       314 Opiates          300 Cocaine          300 THC              50   Glucose, capillary     Status: Abnormal   Collection Time: 02/28/16 11:33 PM  Result Value Ref Range   Glucose-Capillary 209 (H) 65 - 99 mg/dL   Ct Head Wo Contrast  02/28/2016  CLINICAL DATA:  Left side weakness. Loss of left hand grip strength with onset 2 days ago. EXAM: CT HEAD WITHOUT CONTRAST TECHNIQUE: Contiguous axial images were obtained from the base of the skull through the vertex without intravenous contrast. COMPARISON:  None available FINDINGS: Moderate diffuse atrophy. Low attenuation in the deep white matter. More prominent low attenuation in the region of the right basal ganglia does and right thalamus focally. No  hemorrhage or extra-axial fluid. No hydrocephalus. Calvarium intact. Extensive atherosclerotic calcification of the intracranial vessels. IMPRESSION: Chronic involutional change with smaller foci of low attenuation right basal ganglia and right thalamus. Subacute lacunar infarcts suspected. Further evaluation with MRI is recommended. Electronically Signed   By: Skipper Cliche M.D.   On: 02/28/2016 18:20   Mr Angiogram Head Wo Contrast  02/28/2016  CLINICAL DATA:  Stroke. Left-sided weakness. Hypertension and hyperlipidemia. Chronic kidney disease. EXAM: MRI HEAD WITHOUT CONTRAST MRA HEAD WITHOUT CONTRAST TECHNIQUE: Multiplanar, multiecho pulse sequences of the brain and surrounding structures were obtained without intravenous contrast. Angiographic images of the head were obtained using MRA technique without contrast. COMPARISON:  CT head 02/28/2016 FINDINGS: MRI HEAD FINDINGS Negative for acute infarct. Moderate atrophy. Advanced chronic ischemic changes. Chronic microvascular ischemic change throughout the white matter and basal ganglia. Chronic infarcts in the thalamus bilaterally. Chronic ischemia in the pons left greater than right. Numerous areas of chronic micro hemorrhage in the brain. These are present in the cerebral hemispheres, basal ganglia, and cerebellum bilaterally. Findings suggest poorly controlled hypertension versus cerebral amyloid. Negative for mass lesion.  No shift of the midline structures. Pituitary normal in size. Normal skullbase. Mild mucosal edema in the paranasal sinuses. No orbital mass. MRA HEAD FINDINGS Both vertebral arteries patent to the basilar. Left PICA patent. Right PICA not visualized. Basilar widely patent. Superior cerebellar and posterior cerebral arteries patent without significant stenosis. Fetal origin right posterior cerebral artery. Internal carotid artery widely patent without significant stenosis. Anterior and middle cerebral arteries patent bilaterally without  significant stenosis Negative for cerebral aneurysm. IMPRESSION: Negative for acute infarct Advanced chronic microvascular ischemia. Advance multifocal chronic microhemorrhage most consistent with poorly controlled hypertension Negative MRA head Electronically Signed   By: Franchot Gallo M.D.   On: 02/28/2016 21:02   Mr Brain Wo Contrast  02/28/2016  CLINICAL DATA:  Stroke. Left-sided weakness. Hypertension and hyperlipidemia. Chronic kidney disease. EXAM: MRI HEAD WITHOUT CONTRAST MRA HEAD WITHOUT CONTRAST TECHNIQUE: Multiplanar, multiecho pulse sequences of the brain and surrounding structures were obtained without intravenous contrast. Angiographic images of the head were obtained using MRA  technique without contrast. COMPARISON:  CT head 02/28/2016 FINDINGS: MRI HEAD FINDINGS Negative for acute infarct. Moderate atrophy. Advanced chronic ischemic changes. Chronic microvascular ischemic change throughout the white matter and basal ganglia. Chronic infarcts in the thalamus bilaterally. Chronic ischemia in the pons left greater than right. Numerous areas of chronic micro hemorrhage in the brain. These are present in the cerebral hemispheres, basal ganglia, and cerebellum bilaterally. Findings suggest poorly controlled hypertension versus cerebral amyloid. Negative for mass lesion.  No shift of the midline structures. Pituitary normal in size. Normal skullbase. Mild mucosal edema in the paranasal sinuses. No orbital mass. MRA HEAD FINDINGS Both vertebral arteries patent to the basilar. Left PICA patent. Right PICA not visualized. Basilar widely patent. Superior cerebellar and posterior cerebral arteries patent without significant stenosis. Fetal origin right posterior cerebral artery. Internal carotid artery widely patent without significant stenosis. Anterior and middle cerebral arteries patent bilaterally without significant stenosis Negative for cerebral aneurysm. IMPRESSION: Negative for acute infarct  Advanced chronic microvascular ischemia. Advance multifocal chronic microhemorrhage most consistent with poorly controlled hypertension Negative MRA head Electronically Signed   By: Franchot Gallo M.D.   On: 02/28/2016 21:02    Assessment/Plan 70 year old lady presenting with acute left radial neuropathy as well as numbness involving left arm and leg. She has no clinical signs of acute stroke nor indications on CT and MRI ischemic range lesion.  Recommendations: 1. MRI of cervical spine to rule out cervical myelopathy 2. Cockup splint for left wrist 3. Physical and occupational therapy consults 4. No further stroke workup indicated.  We will continue to follow this patient with you.  C.R. Nicole Kindred, MD [facility]  02/29/2016, 12:02 AM

## 2016-02-29 NOTE — Progress Notes (Signed)
Nutrition Brief Note  Patient identified on the Malnutrition Screening Tool (MST) Report  Pt reports that she has lost some weight.  She lives alone uses walker. She eats 2 meals per day. Breakfast: cream of wheat and eggs; Dinner: meat, starch, veggie. Sometimes she cooks other times family brings in food for her.  She enjoys ensure and drinks them at home sometimes.   Nutrition-Focused physical exam completed. Findings are no fat depletion, no muscle depletion, and no edema.  Labs reviewed: CBG's: 88-101  Wt Readings from Last 15 Encounters:  02/29/16 190 lb 14.4 oz (86.592 kg)  11/19/15 198 lb (89.812 kg)  08/16/15 196 lb (88.905 kg)  08/10/15 200 lb 8 oz (90.946 kg)  08/06/15 201 lb (91.173 kg)  07/30/15 201 lb (91.173 kg)  06/14/15 208 lb 9.6 oz (94.62 kg)  05/29/15 208 lb 9.6 oz (94.62 kg)  05/28/15 208 lb 9.6 oz (94.62 kg)  05/23/15 190 lb (86.183 kg)  04/30/15 190 lb (86.183 kg)  04/18/15 209 lb (94.802 kg)  03/19/15 209 lb (94.802 kg)  02/16/15 208 lb (94.348 kg)  02/09/15 208 lb 9 oz (94.603 kg)    Body mass index is 30.83 kg/(m^2). Patient meets criteria for obesity class I based on current BMI.   Current diet order is Heart Healthy/CHO Modified, patient is consuming approximately 100% of meals at this time. Labs and medications reviewed.   Continue to offer ensure as pt desires.  Encouraged PO intake of CHO Modified/Heart Healthy diet.    [place], [place], [place] Pager [phone] After Hours Pager

## 2016-03-01 LAB — HEMOGLOBIN A1C
HEMOGLOBIN A1C: 6.6 % — AB (ref 4.8–5.6)
MEAN PLASMA GLUCOSE: 143 mg/dL

## 2016-03-01 LAB — ECHOCARDIOGRAM COMPLETE
Height: 66 in
Weight: 3054.4 oz

## 2016-03-01 LAB — GLUCOSE, CAPILLARY
Glucose-Capillary: 112 mg/dL — ABNORMAL HIGH (ref 65–99)
Glucose-Capillary: 232 mg/dL — ABNORMAL HIGH (ref 65–99)

## 2016-03-01 LAB — UREA NITROGEN, URINE: Urea Nitrogen, Ur: 324 mg/dL

## 2016-03-01 MED ORDER — HYDRALAZINE HCL 25 MG PO TABS: 25.0000 mg | ORAL_TABLET | Freq: Two times a day (BID) | ORAL | Status: DC

## 2016-03-01 MED ORDER — COLCHICINE 0.6 MG PO TABS: 0.6000 mg | ORAL_TABLET | Freq: Every day | ORAL | Status: DC

## 2016-03-01 NOTE — Progress Notes (Signed)
Cervical spine MRI negative for spinal cord abnormality. No findings concerning for nerve root compression. Will need outpatient Neurology evaluation, including EMG/NCS to assess for possible radial neuropathy or brachial plexus lesion; the latter is less likely given preserved triceps strength. Continue wrist splint.   Kerney Elbe, MD

## 2016-03-01 NOTE — Discharge Summary (Signed)
Physician Discharge Summary  Beth [name] [id number] DOB: 1946-01-17 DOA: 02/28/2016  PCP: Claretta Fraise, MD  Admit date: 02/28/2016 Discharge date: 03/01/2016  Time spent: >30 minutes  Recommendations for Outpatient Follow-up:  1. Follow up with Dr. Livia Snellen in 2-3 weeks for repeat BP and BMP   Discharge Diagnoses:  Principal Problem:   Hypertensive emergency Active Problems:   HLD (hyperlipidemia)   HTN (hypertension)   Pre-diabetes   Hyperlipidemia   Gout   Stroke [facility])   Acute renal failure superimposed on stage 3 chronic kidney disease (HCC)   Elevated troponin  Discharge Condition: stable  Diet recommendation: heart healthy  Filed Weights   02/29/16 0400 02/29/16 0541 03/01/16 0544  Weight: 86.592 kg (190 lb 14.4 oz) 86.592 kg (190 lb 14.4 oz) 88.8 kg (195 lb 12.3 oz)   History of present illness:  See H&P, Labs, Consult and Test reports for all details in brief, patient is a 70 y.o. female with PMH of stroke with left-sided weakness, left elbow gout, hypertension, hyperlipidemia, prediabetes, chronic kidney disease-stage III, who presents with left elbow pain, worsening left-sided weakness.  Hospital Course:  Hypertensive emergency - Patient has elevated blood pressure at 210/115, worsening renal function, BP improved, added hydralazine to her regimen. Continue Amlodipine, nebivolol. Patient unsure whether she takes Benicar but I will discontinue due to her renal function. If renal function remains stable this can be re-added Hx of stroke - MRI and MRA of brain are negative, neurology was consulted because of sudden left wrist drop and weakness.  MRI C spine without compression or acute findings to explain her weakness. Discussed with Dr. Cheral Marker, she will likely need outpatient neurology follow up for EMG/NCS. Referral made.  Elevate troponin - flat trend. Likely due to demanding ischemia secondary to hypertensive emergency. Blood pressure control as above. 2-D echo  with normal EF 55%. No aspirin since patient is allergic Gout flare up in left elbow - continue colchicine; improving. HLD - Last LDL was 81 on 07/30/15, Continue home medications: Lipitor Pre-diabetes - Last A1c 6.3 on 11/19/15, well controled. Patient is taking metformin at home. Acute renal failure superimposed on stage 3 chronic kidney disease ([place]): Baseline Cre is about 1.0, her Cre is 1.33 on admission. Improved with hydration. Hold ACEI for now  Procedures:  2D echo   Consultations:  Neurology   Discharge Exam: Filed Vitals:   03/01/16 0544 03/01/16 0742 03/01/16 1017 03/01/16 1037  BP:  158/88 148/78 148/78  Pulse:  68 65   Temp:  98.2 F (36.8 C)    TempSrc:  Oral    Resp:  16 18   Height:      Weight: 88.8 kg (195 lb 12.3 oz)     SpO2:  96%      General: NAD Cardiovascular: RRR Respiratory: CTA biL  Discharge Instructions Activity:  As tolerated   Get Medicines reviewed and adjusted: Please take all your medications with you for your next visit with your Primary MD  Please request your Primary MD to go over all hospital tests and procedure/radiological results at the follow up, please ask your Primary MD to get all Hospital records sent to his/her office.  If you experience worsening of your admission symptoms, develop shortness of breath, life threatening emergency, suicidal or homicidal thoughts you must seek medical attention immediately by calling 911 or calling your MD immediately if symptoms less severe.  You must read complete instructions/literature along with all the possible adverse reactions/side effects for all  the Medicines you take and that have been prescribed to you. Take any new Medicines after you have completely understood and accpet all the possible adverse reactions/side effects.   Do not drive when taking Pain medications.   Do not take more than prescribed Pain, Sleep and Anxiety Medications  Special Instructions: If you have smoked  or chewed Tobacco in the last 2 yrs please stop smoking, stop any regular Alcohol and or any Recreational drug use.  Wear Seat belts while driving.  Please note  You were cared for by a hospitalist during your hospital stay. Once you are discharged, your primary care physician will handle any further medical issues. Please note that NO REFILLS for any discharge medications will be authorized once you are discharged, as it is imperative that you return to your primary care physician (or establish a relationship with a primary care physician if you do not have one) for your aftercare needs so that they can reassess your need for medications and monitor your lab values. Discharge Instructions    Ambulatory referral to Neurology    Complete by:  As directed   An appointment is requested in approximately: 1 week. Wrist drop, unknown etiology, normal MRI brain and C spine            Medication List    STOP taking these medications        olmesartan-hydrochlorothiazide 40-12.5 MG tablet  Commonly known as:  BENICAR HCT      TAKE these medications        acetaminophen 500 MG tablet  Commonly known as:  TYLENOL  Take 1,000 mg by mouth every 6 (six) hours as needed for mild pain.     amLODipine 10 MG tablet  Commonly known as:  NORVASC  Take 1 tablet (10 mg total) by mouth every evening.     atorvastatin 20 MG tablet  Commonly known as:  LIPITOR  Take 1 tablet (20 mg total) by mouth every evening.     colchicine 0.6 MG tablet  Take 1 tablet (0.6 mg total) by mouth daily.     ferrous fumarate 325 (106 Fe) MG Tabs tablet  Commonly known as:  HEMOCYTE - 106 mg FE  Take 1 tablet (106 mg of iron total) by mouth 2 (two) times daily.     hydrALAZINE 25 MG tablet  Commonly known as:  APRESOLINE  Take 1 tablet (25 mg total) by mouth every 12 (twelve) hours.     metFORMIN 500 MG 24 hr tablet  Commonly known as:  GLUCOPHAGE-XR  TAKE ONE TABLET BY MOUTH ONCE DAILY WITH BREAKFAST      Nebivolol HCl 20 MG Tabs  Commonly known as:  BYSTOLIC  Take 1 tablet (20 mg total) by mouth every evening.     oxyCODONE 5 MG immediate release tablet  Commonly known as:  Oxy IR/ROXICODONE  Take 5 mg by mouth every 6 (six) hours as needed for moderate pain.     solifenacin 5 MG tablet  Commonly known as:  VESICARE  Take 5 mg by mouth daily.           Follow-up Information    Follow up with STACKS,WARREN, MD In 2 weeks.   Specialty:  Family Medicine   Why:  blood pressure check   Contact information:   [address] [place] [address] [phone]       The results of significant diagnostics from this hospitalization (including imaging, microbiology, ancillary and laboratory) are listed  below for reference.    Significant Diagnostic Studies: Ct Head Wo Contrast  02/28/2016  CLINICAL DATA:  Left side weakness. Loss of left hand grip strength with onset 2 days ago. EXAM: CT HEAD WITHOUT CONTRAST TECHNIQUE: Contiguous axial images were obtained from the base of the skull through the vertex without intravenous contrast. COMPARISON:  None available FINDINGS: Moderate diffuse atrophy. Low attenuation in the deep white matter. More prominent low attenuation in the region of the right basal ganglia does and right thalamus focally. No hemorrhage or extra-axial fluid. No hydrocephalus. Calvarium intact. Extensive atherosclerotic calcification of the intracranial vessels. IMPRESSION: Chronic involutional change with smaller foci of low attenuation right basal ganglia and right thalamus. Subacute lacunar infarcts suspected. Further evaluation with MRI is recommended. Electronically Signed   By: Skipper Cliche M.D.   On: 02/28/2016 18:20   Mr Angiogram Head Wo Contrast  02/28/2016  CLINICAL DATA:  Stroke. Left-sided weakness. Hypertension and hyperlipidemia. Chronic kidney disease. EXAM: MRI HEAD WITHOUT CONTRAST MRA HEAD WITHOUT CONTRAST TECHNIQUE: Multiplanar, multiecho pulse sequences of  the brain and surrounding structures were obtained without intravenous contrast. Angiographic images of the head were obtained using MRA technique without contrast. COMPARISON:  CT head 02/28/2016 FINDINGS: MRI HEAD FINDINGS Negative for acute infarct. Moderate atrophy. Advanced chronic ischemic changes. Chronic microvascular ischemic change throughout the white matter and basal ganglia. Chronic infarcts in the thalamus bilaterally. Chronic ischemia in the pons left greater than right. Numerous areas of chronic micro hemorrhage in the brain. These are present in the cerebral hemispheres, basal ganglia, and cerebellum bilaterally. Findings suggest poorly controlled hypertension versus cerebral amyloid. Negative for mass lesion.  No shift of the midline structures. Pituitary normal in size. Normal skullbase. Mild mucosal edema in the paranasal sinuses. No orbital mass. MRA HEAD FINDINGS Both vertebral arteries patent to the basilar. Left PICA patent. Right PICA not visualized. Basilar widely patent. Superior cerebellar and posterior cerebral arteries patent without significant stenosis. Fetal origin right posterior cerebral artery. Internal carotid artery widely patent without significant stenosis. Anterior and middle cerebral arteries patent bilaterally without significant stenosis Negative for cerebral aneurysm. IMPRESSION: Negative for acute infarct Advanced chronic microvascular ischemia. Advance multifocal chronic microhemorrhage most consistent with poorly controlled hypertension Negative MRA head Electronically Signed   By: Franchot Gallo M.D.   On: 02/28/2016 21:02   Mr Brain Wo Contrast  02/28/2016  CLINICAL DATA:  Stroke. Left-sided weakness. Hypertension and hyperlipidemia. Chronic kidney disease. EXAM: MRI HEAD WITHOUT CONTRAST MRA HEAD WITHOUT CONTRAST TECHNIQUE: Multiplanar, multiecho pulse sequences of the brain and surrounding structures were obtained without intravenous contrast. Angiographic  images of the head were obtained using MRA technique without contrast. COMPARISON:  CT head 02/28/2016 FINDINGS: MRI HEAD FINDINGS Negative for acute infarct. Moderate atrophy. Advanced chronic ischemic changes. Chronic microvascular ischemic change throughout the white matter and basal ganglia. Chronic infarcts in the thalamus bilaterally. Chronic ischemia in the pons left greater than right. Numerous areas of chronic micro hemorrhage in the brain. These are present in the cerebral hemispheres, basal ganglia, and cerebellum bilaterally. Findings suggest poorly controlled hypertension versus cerebral amyloid. Negative for mass lesion.  No shift of the midline structures. Pituitary normal in size. Normal skullbase. Mild mucosal edema in the paranasal sinuses. No orbital mass. MRA HEAD FINDINGS Both vertebral arteries patent to the basilar. Left PICA patent. Right PICA not visualized. Basilar widely patent. Superior cerebellar and posterior cerebral arteries patent without significant stenosis. Fetal origin right posterior cerebral artery. Internal carotid artery widely patent  without significant stenosis. Anterior and middle cerebral arteries patent bilaterally without significant stenosis Negative for cerebral aneurysm. IMPRESSION: Negative for acute infarct Advanced chronic microvascular ischemia. Advance multifocal chronic microhemorrhage most consistent with poorly controlled hypertension Negative MRA head Electronically Signed   By: Franchot Gallo M.D.   On: 02/28/2016 21:02   Mr Cervical Spine W Wo Contrast  02/29/2016  CLINICAL DATA:  Left arm pain and weakness EXAM: MRI CERVICAL SPINE WITHOUT AND WITH CONTRAST TECHNIQUE: Multiplanar and multiecho pulse sequences of the cervical spine, to include the craniocervical junction and cervicothoracic junction, were obtained according to standard protocol without and with intravenous contrast. CONTRAST:  20 mL MultiHance IV COMPARISON:  Cervical MRI 04/23/2010  FINDINGS: Image quality degraded by motion. Normal cervical alignment. Negative for fracture or mass. Negative for cord compression. Postcontrast imaging reveals no enhancing mass lesion. C2-3:  Negative C3-4: Mild uncinate spurring bilaterally. Mild facet hypertrophy. Mild foraminal narrowing bilaterally. C4-5: Disc degeneration with diffuse uncinate spurring. Moderate right foraminal encroachment. Mild left foraminal encroachment C5-6: Disc degeneration with diffuse uncinate spurring right greater than left. Moderate right foraminal encroachment and mild left foraminal encroachment. Mild spinal stenosis. C6-7: Disc degeneration and spondylosis. Mild foraminal narrowing bilaterally. C7-T1: Disc degeneration and spondylosis. Mild foraminal narrowing bilaterally. Spondylosis in the upper thoracic spine IMPRESSION: Negative for fracture or mass.  No cord compression Multilevel disc degeneration and spurring, asymmetric on the right at C4-5 and C5-6 causing right foraminal encroachment. See above description. Electronically Signed   By: Franchot Gallo M.D.   On: 02/29/2016 13:44   Labs: Basic Metabolic Panel:  Recent Labs Lab 02/28/16 1541 02/28/16 1557 02/29/16 0748  NA 134* 134* 135  K 3.9 3.7 3.6  CL 95* 93* 100*  CO2 29  --  26  GLUCOSE 163* 158* 110*  BUN 13 15 16   CREATININE 1.33* 1.20* 1.01*  CALCIUM 9.4  --  8.7*   Liver Function Tests:  Recent Labs Lab 02/28/16 1541  AST 19  ALT 12*  ALKPHOS 111  BILITOT 1.4*  PROT 8.4*  ALBUMIN 3.5   CBC:  Recent Labs Lab 02/28/16 1541 02/28/16 1557  WBC 10.5  --   NEUTROABS 7.0  --   HGB 12.9 15.6*  HCT 40.1 46.0  MCV 77.1*  --   PLT 344  --    Cardiac Enzymes:  Recent Labs Lab 02/28/16 2130 02/29/16 0224 02/29/16 0748  TROPONINI 0.11* 0.10* 0.10*   CBG:  Recent Labs Lab 02/29/16 1330 02/29/16 1611 02/29/16 2059 03/01/16 0808 03/01/16 1148  GLUCAP 101* 155* 118* 112* 232*   Signed:  GHERGHE, COSTIN  [facility] 03/01/2016, 3:06 PM

## 2016-03-03 ENCOUNTER — Encounter: Payer: Medicare Other | Admitting: Physical Therapy

## 2016-03-05 ENCOUNTER — Encounter: Payer: Medicare Other | Admitting: Physical Therapy

## 2016-03-07 ENCOUNTER — Encounter: Payer: Self-pay | Admitting: Family Medicine

## 2016-03-07 ENCOUNTER — Ambulatory Visit (INDEPENDENT_AMBULATORY_CARE_PROVIDER_SITE_OTHER): Payer: Medicare Other | Admitting: Family Medicine

## 2016-03-07 VITALS — BP 147/84 | HR 94 | Temp 96.3°F | Ht 65.0 in | Wt 187.0 lb

## 2016-03-07 DIAGNOSIS — I10 Essential (primary) hypertension: Secondary | ICD-10-CM

## 2016-03-07 DIAGNOSIS — E785 Hyperlipidemia, unspecified: Secondary | ICD-10-CM | POA: Diagnosis not present

## 2016-03-07 DIAGNOSIS — E119 Type 2 diabetes mellitus without complications: Secondary | ICD-10-CM | POA: Insufficient documentation

## 2016-03-07 DIAGNOSIS — R202 Paresthesia of skin: Secondary | ICD-10-CM | POA: Diagnosis not present

## 2016-03-07 DIAGNOSIS — D509 Iron deficiency anemia, unspecified: Secondary | ICD-10-CM

## 2016-03-07 MED ORDER — ATORVASTATIN CALCIUM 20 MG PO TABS: 20.0000 mg | ORAL_TABLET | Freq: Every evening | ORAL | Status: DC

## 2016-03-07 MED ORDER — NEBIVOLOL HCL 20 MG PO TABS: 1.0000 | ORAL_TABLET | Freq: Every evening | ORAL | Status: DC

## 2016-03-07 MED ORDER — AMLODIPINE BESYLATE 10 MG PO TABS: 10.0000 mg | ORAL_TABLET | Freq: Every evening | ORAL | Status: DC

## 2016-03-07 MED ORDER — OLMESARTAN MEDOXOMIL 40 MG PO TABS: 40.0000 mg | ORAL_TABLET | Freq: Every day | ORAL | Status: DC

## 2016-03-07 MED ORDER — FERROUS FUMARATE 325 (106 FE) MG PO TABS: 1.0000 | ORAL_TABLET | Freq: Two times a day (BID) | ORAL | Status: DC

## 2016-03-07 MED ORDER — COLCHICINE 0.6 MG PO TABS: 0.6000 mg | ORAL_TABLET | Freq: Every day | ORAL | Status: DC

## 2016-03-07 MED ORDER — METFORMIN HCL ER 500 MG PO TB24: 500.0000 mg | ORAL_TABLET | Freq: Two times a day (BID) | ORAL | Status: DC

## 2016-03-07 NOTE — Progress Notes (Signed)
Subjective:  Patient ID: Beth [name], female    DOB: Aug 14, 1946  Age: 70 y.o. MRN: [id number]  CC: Hypertension; Anemia; Hyperlipidemia; and Diabetes   HPI SKYYE GABA presents for  follow-up of hypertension. Patient has no history of headache chest pain or shortness of breath or recent cough. Patient also denies symptoms of TIA such as numbness weakness lateralizing. Patient checks  blood pressure at home and has not had any elevated readings recently. Patient denies side effects from medication. States taking it regularly except thought hydralazine was for gout, so not taking. Ran out of amlodipine 5 days ago..  Patient also  in for follow-up of elevated cholesterol. Doing well without complaints on current medication. Denies side effects of statin including myalgia and arthralgia and nausea. Also in today for liver function testing. Currently no chest pain, shortness of breath or other cardiovascular related symptoms noted.  Follow-up of diabetes. Patient does check blood sugar at home. Readings run between 110 and 160 fasting Patient denies symptoms such as polyuria, polydipsia, excessive hunger, nausea No significant hypoglycemic spells noted.  Pt. statees that the left hand is weak and painful. Was told it was a nerve. Can't extend wrist   History Shacoya has a past medical history of Hypertension; CVA (cerebral infarction); Hyperlipidemia; Blood transfusion without reported diagnosis (2012); Heart murmur; Nocturia; Gout; Arthritis; Anemia; Herpes infection (08-09-14); Family history of anesthesia complication; Stroke [facility]) (2006); Cataract; and CKD (chronic kidney disease), stage III.   She has past surgical history that includes Carpal tunnel release (Right, 1983); Abdominal hysterectomy (1983); colonscopy (June 21, 2014); nasal cauterization (2012); Total knee arthroplasty (Right, 07/03/2014); Patellar tendon repair (Right, 08/11/2014); Joint replacement (06/2014); I&D knee with poly  exchange (Right, 10/02/2014); Excisional total knee arthroplasty with antibiotic spacers (Right, 02/16/2015); and Reimplantation of total knee (Right, 05/23/2015).   Her family history includes Diabetes in her son and son; Heart disease in her brother; Hypertension in her father; Kidney disease in her daughter; Ovarian cancer in her mother; Peripheral vascular disease in her father. There is no history of Colon cancer, Esophageal cancer, Stomach cancer, or Rectal cancer.She reports that she has never smoked. She has never used smokeless tobacco. She reports that she does not drink alcohol or use illicit drugs.  Current Outpatient Prescriptions on File Prior to Visit  Medication Sig Dispense Refill  . acetaminophen (TYLENOL) 500 MG tablet Take 1,000 mg by mouth every 6 (six) hours as needed for mild pain.    Marland Kitchen oxyCODONE (OXY IR/ROXICODONE) 5 MG immediate release tablet Take 5 mg by mouth every 6 (six) hours as needed for moderate pain.     Marland Kitchen solifenacin (VESICARE) 5 MG tablet Take 5 mg by mouth daily.    . hydrALAZINE (APRESOLINE) 25 MG tablet Take 1 tablet (25 mg total) by mouth every 12 (twelve) hours. (Patient not taking: Reported on 03/07/2016) 60 tablet 1   Current Facility-Administered Medications on File Prior to Visit  Medication Dose Route Frequency Provider Last Rate Last Dose  . tranexamic acid (CYKLOKAPRON) 2,000 mg in sodium chloride 0.9 % 50 mL Topical Application  123XX123 mg Topical Once [name], MD       Patient Active Problem List   Diagnosis Date Noted  . Diabetes type 2, controlled ([place]) 03/07/2016  . Gout   . Essential hypertension   . Enuresis 11/19/2015  . HLD (hyperlipidemia) 01/04/2014  . HTN (hypertension) 01/04/2014  . Anemia, iron deficiency 12/30/2011  . Constipation 04/04/2009  . TUBULOVILLOUS ADENOMA,  COLON 04/03/2009    ROS Review of Systems  Constitutional: Negative for fever, activity change and appetite change.  HENT: Negative for congestion,  rhinorrhea and sore throat.   Eyes: Negative for visual disturbance.  Respiratory: Negative for cough and shortness of breath.   Cardiovascular: Negative for chest pain and palpitations.  Gastrointestinal: Negative for nausea, abdominal pain and diarrhea.  Genitourinary: Negative for dysuria.  Musculoskeletal: Negative for myalgias and arthralgias.    Objective:  BP 147/84 mmHg  Pulse 94  Temp(Src) 96.3 F (35.7 C) (Oral)  Ht 5\' 5"  (1.651 m)  Wt 187 lb (84.823 kg)  BMI 31.12 kg/m2  SpO2 96%  BP Readings from Last 3 Encounters:  03/07/16 147/84  03/01/16 148/78  11/19/15 152/90    Wt Readings from Last 3 Encounters:  03/07/16 187 lb (84.823 kg)  03/01/16 195 lb 12.3 oz (88.8 kg)  11/19/15 198 lb (89.812 kg)     Physical Exam  Constitutional: She is oriented to person, place, and time. She appears well-developed and well-nourished. No distress.  HENT:  Head: Normocephalic and atraumatic.  Right Ear: External ear normal.  Left Ear: External ear normal.  Nose: Nose normal.  Mouth/Throat: Oropharynx is clear and moist.  Eyes: Conjunctivae and EOM are normal. Pupils are equal, round, and reactive to light.  Neck: Normal range of motion. Neck supple. No thyromegaly present.  Cardiovascular: Normal rate, regular rhythm and normal heart sounds.   No murmur heard. Pulmonary/Chest: Effort normal and breath sounds normal. No respiratory distress. She has no wheezes. She has no rales.  Abdominal: Soft. Bowel sounds are normal. She exhibits no distension. There is no tenderness.  Lymphadenopathy:    She has no cervical adenopathy.  Neurological: She is alert and oriented to person, place, and time. She has normal reflexes.  Skin: Skin is warm and dry.  Psychiatric: She has a normal mood and affect. Her behavior is normal. Judgment and thought content normal.    Lab Results  Component Value Date   HGBA1C 6.6* 02/29/2016   HGBA1C 6.3 11/19/2015   HGBA1C 6.3 08/10/2015       Ct Head Wo Contrast  02/28/2016  CLINICAL DATA:  Left side weakness. Loss of left hand grip strength with onset 2 days ago. EXAM: CT HEAD WITHOUT CONTRAST TECHNIQUE: Contiguous axial images were obtained from the base of the skull through the vertex without intravenous contrast. COMPARISON:  None available FINDINGS: Moderate diffuse atrophy. Low attenuation in the deep white matter. More prominent low attenuation in the region of the right basal ganglia does and right thalamus focally. No hemorrhage or extra-axial fluid. No hydrocephalus. Calvarium intact. Extensive atherosclerotic calcification of the intracranial vessels. IMPRESSION: Chronic involutional change with smaller foci of low attenuation right basal ganglia and right thalamus. Subacute lacunar infarcts suspected. Further evaluation with MRI is recommended. Electronically Signed   By: Skipper Cliche M.D.   On: 02/28/2016 18:20   Mr Angiogram Head Wo Contrast  02/28/2016  CLINICAL DATA:  Stroke. Left-sided weakness. Hypertension and hyperlipidemia. Chronic kidney disease. EXAM: MRI HEAD WITHOUT CONTRAST MRA HEAD WITHOUT CONTRAST TECHNIQUE: Multiplanar, multiecho pulse sequences of the brain and surrounding structures were obtained without intravenous contrast. Angiographic images of the head were obtained using MRA technique without contrast. COMPARISON:  CT head 02/28/2016 FINDINGS: MRI HEAD FINDINGS Negative for acute infarct. Moderate atrophy. Advanced chronic ischemic changes. Chronic microvascular ischemic change throughout the white matter and basal ganglia. Chronic infarcts in the thalamus bilaterally. Chronic ischemia in the pons  left greater than right. Numerous areas of chronic micro hemorrhage in the brain. These are present in the cerebral hemispheres, basal ganglia, and cerebellum bilaterally. Findings suggest poorly controlled hypertension versus cerebral amyloid. Negative for mass lesion.  No shift of the midline structures.  Pituitary normal in size. Normal skullbase. Mild mucosal edema in the paranasal sinuses. No orbital mass. MRA HEAD FINDINGS Both vertebral arteries patent to the basilar. Left PICA patent. Right PICA not visualized. Basilar widely patent. Superior cerebellar and posterior cerebral arteries patent without significant stenosis. Fetal origin right posterior cerebral artery. Internal carotid artery widely patent without significant stenosis. Anterior and middle cerebral arteries patent bilaterally without significant stenosis Negative for cerebral aneurysm. IMPRESSION: Negative for acute infarct Advanced chronic microvascular ischemia. Advance multifocal chronic microhemorrhage most consistent with poorly controlled hypertension Negative MRA head Electronically Signed   By: Franchot Gallo M.D.   On: 02/28/2016 21:02   Mr Brain Wo Contrast  02/28/2016  CLINICAL DATA:  Stroke. Left-sided weakness. Hypertension and hyperlipidemia. Chronic kidney disease. EXAM: MRI HEAD WITHOUT CONTRAST MRA HEAD WITHOUT CONTRAST TECHNIQUE: Multiplanar, multiecho pulse sequences of the brain and surrounding structures were obtained without intravenous contrast. Angiographic images of the head were obtained using MRA technique without contrast. COMPARISON:  CT head 02/28/2016 FINDINGS: MRI HEAD FINDINGS Negative for acute infarct. Moderate atrophy. Advanced chronic ischemic changes. Chronic microvascular ischemic change throughout the white matter and basal ganglia. Chronic infarcts in the thalamus bilaterally. Chronic ischemia in the pons left greater than right. Numerous areas of chronic micro hemorrhage in the brain. These are present in the cerebral hemispheres, basal ganglia, and cerebellum bilaterally. Findings suggest poorly controlled hypertension versus cerebral amyloid. Negative for mass lesion.  No shift of the midline structures. Pituitary normal in size. Normal skullbase. Mild mucosal edema in the paranasal sinuses. No orbital  mass. MRA HEAD FINDINGS Both vertebral arteries patent to the basilar. Left PICA patent. Right PICA not visualized. Basilar widely patent. Superior cerebellar and posterior cerebral arteries patent without significant stenosis. Fetal origin right posterior cerebral artery. Internal carotid artery widely patent without significant stenosis. Anterior and middle cerebral arteries patent bilaterally without significant stenosis Negative for cerebral aneurysm. IMPRESSION: Negative for acute infarct Advanced chronic microvascular ischemia. Advance multifocal chronic microhemorrhage most consistent with poorly controlled hypertension Negative MRA head Electronically Signed   By: Franchot Gallo M.D.   On: 02/28/2016 21:02   Mr Cervical Spine W Wo Contrast  02/29/2016  CLINICAL DATA:  Left arm pain and weakness EXAM: MRI CERVICAL SPINE WITHOUT AND WITH CONTRAST TECHNIQUE: Multiplanar and multiecho pulse sequences of the cervical spine, to include the craniocervical junction and cervicothoracic junction, were obtained according to standard protocol without and with intravenous contrast. CONTRAST:  20 mL MultiHance IV COMPARISON:  Cervical MRI 04/23/2010 FINDINGS: Image quality degraded by motion. Normal cervical alignment. Negative for fracture or mass. Negative for cord compression. Postcontrast imaging reveals no enhancing mass lesion. C2-3:  Negative C3-4: Mild uncinate spurring bilaterally. Mild facet hypertrophy. Mild foraminal narrowing bilaterally. C4-5: Disc degeneration with diffuse uncinate spurring. Moderate right foraminal encroachment. Mild left foraminal encroachment C5-6: Disc degeneration with diffuse uncinate spurring right greater than left. Moderate right foraminal encroachment and mild left foraminal encroachment. Mild spinal stenosis. C6-7: Disc degeneration and spondylosis. Mild foraminal narrowing bilaterally. C7-T1: Disc degeneration and spondylosis. Mild foraminal narrowing bilaterally. Spondylosis  in the upper thoracic spine IMPRESSION: Negative for fracture or mass.  No cord compression Multilevel disc degeneration and spurring, asymmetric on the right at C4-5  and C5-6 causing right foraminal encroachment. See above description. Electronically Signed   By: Franchot Gallo M.D.   On: 02/29/2016 13:44    Assessment & Plan:   Kohana was seen today for hypertension, anemia, hyperlipidemia and diabetes.  Diagnoses and all orders for this visit:  Controlled type 2 diabetes mellitus without complication, without long-term current use of insulin (HCC)  HLD (hyperlipidemia)  Essential hypertension  Anemia, iron deficiency  Paresthesia of left upper extremity -     Nerve conduction test; Future  Other orders -     amLODipine (NORVASC) 10 MG tablet; Take 1 tablet (10 mg total) by mouth every evening. -     atorvastatin (LIPITOR) 20 MG tablet; Take 1 tablet (20 mg total) by mouth every evening. -     colchicine 0.6 MG tablet; Take 1 tablet (0.6 mg total) by mouth daily. For gout -     ferrous fumarate (HEMOCYTE - 106 MG FE) 325 (106 Fe) MG TABS tablet; Take 1 tablet (106 mg of iron total) by mouth 2 (two) times daily. For anemia / iron -     metFORMIN (GLUCOPHAGE-XR) 500 MG 24 hr tablet; Take 1 tablet (500 mg total) by mouth 2 (two) times daily. -     olmesartan (BENICAR) 40 MG tablet; Take 1 tablet (40 mg total) by mouth daily. For blood pressure -     Nebivolol HCl (BYSTOLIC) 20 MG TABS; Take 1 tablet (20 mg total) by mouth every evening. For blood pressure   I have changed Ms. Dittmar's colchicine, ferrous fumarate, metFORMIN, olmesartan, and Nebivolol HCl. I am also having her maintain her oxyCODONE, acetaminophen, solifenacin, hydrALAZINE, traMADol, amLODipine, and atorvastatin.  Meds ordered this encounter  Medications  . DISCONTD: olmesartan (BENICAR) 40 MG tablet    Sig: Take 40 mg by mouth daily.  . traMADol (ULTRAM) 50 MG tablet    Sig: Take 100 mg by mouth every 6 (six) hours  as needed.  Marland Kitchen amLODipine (NORVASC) 10 MG tablet    Sig: Take 1 tablet (10 mg total) by mouth every evening.    Dispense:  90 tablet    Refill:  1  . atorvastatin (LIPITOR) 20 MG tablet    Sig: Take 1 tablet (20 mg total) by mouth every evening.    Dispense:  90 tablet    Refill:  1  . colchicine 0.6 MG tablet    Sig: Take 1 tablet (0.6 mg total) by mouth daily. For gout    Dispense:  30 tablet    Refill:  5  . ferrous fumarate (HEMOCYTE - 106 MG FE) 325 (106 Fe) MG TABS tablet    Sig: Take 1 tablet (106 mg of iron total) by mouth 2 (two) times daily. For anemia / iron    Dispense:  60 each    Refill:  5  . metFORMIN (GLUCOPHAGE-XR) 500 MG 24 hr tablet    Sig: Take 1 tablet (500 mg total) by mouth 2 (two) times daily.    Dispense:  180 tablet    Refill:  3  . olmesartan (BENICAR) 40 MG tablet    Sig: Take 1 tablet (40 mg total) by mouth daily. For blood pressure    Dispense:  30 tablet    Refill:  5  . Nebivolol HCl (BYSTOLIC) 20 MG TABS    Sig: Take 1 tablet (20 mg total) by mouth every evening. For blood pressure    Dispense:  90 tablet    Refill:  1  Follow-up: Return in about 3 months (around 06/06/2016).  Claretta Fraise, M.D.

## 2016-03-10 DIAGNOSIS — R350 Frequency of micturition: Secondary | ICD-10-CM | POA: Diagnosis not present

## 2016-03-10 DIAGNOSIS — Z0000 Encounter for general adult medical examination without abnormal findings: Secondary | ICD-10-CM | POA: Diagnosis not present

## 2016-03-10 DIAGNOSIS — N3941 Urge incontinence: Secondary | ICD-10-CM | POA: Diagnosis not present

## 2016-03-10 DIAGNOSIS — R351 Nocturia: Secondary | ICD-10-CM | POA: Diagnosis not present

## 2016-03-11 ENCOUNTER — Encounter: Payer: Self-pay | Admitting: Physical Therapy

## 2016-03-11 ENCOUNTER — Ambulatory Visit: Payer: Medicare Other | Attending: Orthopedic Surgery | Admitting: Physical Therapy

## 2016-03-11 DIAGNOSIS — M25562 Pain in left knee: Secondary | ICD-10-CM | POA: Diagnosis not present

## 2016-03-11 DIAGNOSIS — R269 Unspecified abnormalities of gait and mobility: Secondary | ICD-10-CM | POA: Insufficient documentation

## 2016-03-11 DIAGNOSIS — R29898 Other symptoms and signs involving the musculoskeletal system: Secondary | ICD-10-CM | POA: Diagnosis not present

## 2016-03-11 DIAGNOSIS — R2689 Other abnormalities of gait and mobility: Secondary | ICD-10-CM | POA: Insufficient documentation

## 2016-03-11 DIAGNOSIS — M6281 Muscle weakness (generalized): Secondary | ICD-10-CM | POA: Diagnosis not present

## 2016-03-11 NOTE — Therapy (Signed)
[facility] [place], [place], [address] Phone: [phone]   Fax:  [phone]  Physical Therapy Treatment  Patient Details  Name: Beth [name] MRN: [id number] Date of Birth: June 28, 1946 Referring Provider: [name] MD  Encounter Date: 03/11/2016      PT End of Session - 03/11/16 1437    Visit Number 4   Number of Visits 8   Date for PT Re-Evaluation 03/25/16   PT Start Time [id number]   PT Stop Time 1517   PT Time Calculation (min) 43 min   Activity Tolerance Patient tolerated treatment well;Patient limited by pain   Behavior During Therapy [facility] for tasks assessed/performed      Past Medical History  Diagnosis Date  . Hypertension   . CVA (cerebral infarction)     2006  . Hyperlipidemia   . Blood transfusion without reported diagnosis 2012    anemia;pt denies transfusion stated was only on iron tablet  . Heart murmur   . Nocturia     3-4 times per night  . Gout     left elbow  . Arthritis     Knee both knees  . Anemia   . 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 08-09-14 Right eye  . Family history of anesthesia complication     sister very slow to awaken after anesthesia;severe vomiting   . Stroke ([place]) 2006    x 1 no deficits noted   . Cataract     left  . CKD (chronic kidney disease), stage III     Past Surgical History  Procedure Laterality Date  . Carpal tunnel release Right 1983  . Abdominal hysterectomy  1983  . Colonscopy  June 21, 2014  . Nasal cauterization  2012  . Total knee arthroplasty Right 07/03/2014    Procedure: RIGHT TOTAL KNEE ARTHROPLASTY;  Surgeon: Gearlean Alf, MD;  Location: [facility];  Service: Orthopedics;  Laterality: Right;  . Patellar tendon repair Right 08/11/2014    Procedure: RIGHT PATELLA TENDON REPAIR;  Surgeon: Gearlean Alf, MD;  Location: [facility];  Service: Orthopedics;  Laterality: Right;  . Joint replacement  06/2014    right knee  . I&d knee with poly  exchange Right 10/02/2014    Procedure: IRRIGATION AND DEBRIDEMENT RIGHT KNEE WITH POLY EXCHANGE;  Surgeon: Gearlean Alf, MD;  Location: [facility];  Service: Orthopedics;  Laterality: Right;  . Excisional total knee arthroplasty with antibiotic spacers Right 02/16/2015    Procedure: RIGHT KNEE RESECTION ARTHROPLASTY WITH ANTIBIOTIC SPACERS;  Surgeon: [name], MD;  Location: [facility];  Service: Orthopedics;  Laterality: Right;  . Reimplantation of total knee Right 05/23/2015    Procedure: RIGHT KNEE ARTHROPLASTY REIMPLANTATION;  Surgeon: [name], MD;  Location: [facility];  Service: Orthopedics;  Laterality: Right;    There were no vitals filed for this visit.  Visit Diagnosis:  Weakness of left lower extremity  Left knee pain  Abnormality of gait      Subjective Assessment - 03/11/16 1437    Subjective States that she was recently in hospital due to gout flair up and is now wearing a wrist brace on L wrist. States that she is not able to extend fingers well. Reports pain on the lateral L knee "in the bend." States that she has been wearing a weight on L leg around the house to "stretch"   Patient Stated Goals to be able to walk with a quad cane  Currently in Pain? Yes   Pain Score 5    Pain Location Knee   Pain Orientation Left   Pain Descriptors / Indicators Aching;Tingling   Pain Type Chronic pain   Pain Onset More than a month ago            [facility] PT Assessment - 03/11/16 0001    Assessment   Medical Diagnosis chronic Left knee pain   Onset Date/Surgical Date 02/12/15   Next MD Visit 03/12/2016   Precautions   Precautions Fall   Precaution Comments FALL RISK   ROM / Strength   AROM / PROM / Strength AROM   AROM   Overall AROM  Deficits   AROM Assessment Site Knee   Right/Left Knee Left   Left Knee Extension -15   Left Knee Flexion 98                     [facility] Adult PT Treatment/Exercise - 03/11/16 0001    Knee/Hip Exercises: Aerobic   Nustep L5  x15 min    Knee/Hip Exercises: Supine   Short Arc Quad Sets Strengthening;Left;3 sets;10 reps   Short Arc Quad Sets Limitations 3#   Bridges Strengthening;Both;2 sets;10 reps   Other Supine Knee/Hip Exercises Supine B marching x20 reps   Other Supine Knee/Hip Exercises Supine B clamshell red theraband x20 reps   Modalities   Modalities Ultrasound   Ultrasound   Ultrasound Location L lateral knee   Ultrasound Parameters 1.0 w/cm2, 50%, 3.3 mhz x12 min   Ultrasound Goals Pain                     PT Long Term Goals - 02/12/16 1532    PT LONG TERM GOAL #1   Title Improved L knee flexion to allow foot clearance in swing phase of gait.   Time 4   Period Weeks   Status New   PT LONG TERM GOAL #2   Title decreased L knee pain to 2-3/10 or less with activity.   Time 4   Period Weeks   Status New               Plan - 03/11/16 1537    Clinical Impression Statement Patient tolerated today's treatment although she remains limited with L knee ROM. Patient has recently suffered a flare up of gout. Continues to report soreness now in lateral L knee around attachment of L ITB. Weakness continues to be noted with therapeutic exercises and lack of full L knee ROM evident. L knee flexion has improved since evaluation although extension regressed but was measured overal as 15-98 deg. Normal [place] response noted following end of the [place] to lateral L knee.    Pt will benefit from skilled therapeutic intervention in order to improve on the following deficits Pain;Decreased activity tolerance;Decreased strength;Decreased range of motion;Abnormal gait   Rehab Potential Good   Clinical Impairments Affecting Rehab Potential mulitiple knee surgeries   PT Frequency 2x / week   PT Duration 4 weeks   PT Treatment/Interventions Cryotherapy;Air traffic controller;Therapeutic exercise;Manual techniques;Patient/family education;Neuromuscular re-education;Passive  range of motion;Dry needling;Taping   PT Next Visit Plan Continue L knee strengthening and manual therapy as needed. Modalities and STW PRN per MPT POC   PT Home Exercise Plan active knee flex/ext   Consulted and Agree with Plan of Care Patient        Problem List Patient Active Problem List   Diagnosis Date Noted  . Diabetes  type 2, controlled ([place]) 03/07/2016  . Gout   . Essential hypertension   . Enuresis 11/19/2015  . HLD (hyperlipidemia) 01/04/2014  . HTN (hypertension) 01/04/2014  . Anemia, iron deficiency 12/30/2011  . Constipation 04/04/2009  . TUBULOVILLOUS ADENOMA, COLON 04/03/2009    Hansika Leaming, [place] 03/11/2016, 4:04 PM  [facility] [address] [place], [place], [address] Phone: [phone]   Fax:  [phone]  Name: IVERNA FENRICH MRN: [id number] Date of Birth: 1946-10-20

## 2016-03-11 NOTE — Therapy (Signed)
[facility] [place], [place], [address] Phone: [phone]   Fax:  [phone]  Physical Therapy Treatment  Patient Details  Name: Beth [name] MRN: [id number] Date of Birth: [birth year] Referring Provider: [name] MD  Encounter Date: 03/11/2016      PT End of Session - 03/11/16 1437    Visit Number 4   Number of Visits 8   Date for PT Re-Evaluation 03/25/16   PT Start Time [id number]   PT Stop Time 1517   PT Time Calculation (min) 43 min   Activity Tolerance Patient tolerated treatment well;Patient limited by pain   Behavior During Therapy [facility] for tasks assessed/performed      Past Medical History  Diagnosis Date  . Hypertension   . CVA (cerebral infarction)     2006  . Hyperlipidemia   . Blood transfusion without reported diagnosis 2012    anemia;pt denies transfusion stated was only on iron tablet  . Heart murmur   . Nocturia     3-4 times per night  . Gout     left elbow  . Arthritis     Knee both knees  . Anemia   . 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 08-09-14 Right eye  . Family history of anesthesia complication     sister very slow to awaken after anesthesia;severe vomiting   . Stroke ([place]) 2006    x 1 no deficits noted   . Cataract     left  . CKD (chronic kidney disease), stage III     Past Surgical History  Procedure Laterality Date  . Carpal tunnel release Right 1983  . Abdominal hysterectomy  1983  . Colonscopy  June 21, 2014  . Nasal cauterization  2012  . Total knee arthroplasty Right 07/03/2014    Procedure: RIGHT TOTAL KNEE ARTHROPLASTY;  Surgeon: Gearlean Alf, MD;  Location: [facility];  Service: Orthopedics;  Laterality: Right;  . Patellar tendon repair Right 08/11/2014    Procedure: RIGHT PATELLA TENDON REPAIR;  Surgeon: Gearlean Alf, MD;  Location: [facility];  Service: Orthopedics;  Laterality: Right;  . Joint replacement  06/2014    right knee  . I&d knee with poly  exchange Right 10/02/2014    Procedure: IRRIGATION AND DEBRIDEMENT RIGHT KNEE WITH POLY EXCHANGE;  Surgeon: Gearlean Alf, MD;  [facility]: [facility];  Service: Orthopedics;  Laterality: Right;  . Excisional total knee arthroplasty with antibiotic spacers Right 02/16/2015    Procedure: RIGHT KNEE RESECTION ARTHROPLASTY WITH ANTIBIOTIC SPACERS;  Surgeon: [name], MD;  [facility];  Service: Orthopedics;  Laterality: Right;  . Reimplantation of total knee Right 05/23/2015    Procedure: RIGHT KNEE ARTHROPLASTY REIMPLANTATION;  Surgeon: [name], MD;  [facility];  Service: Orthopedics;  Laterality: Right;    There were no vitals filed for this visit.  Visit Diagnosis:  Weakness of left lower extremity  Left knee pain  Abnormality of gait      Subjective Assessment - 03/11/16 1437    Subjective States that she was recently in hospital due to gout flair up and is now wearing a wrist brace on L wrist. States that she is not able to extend fingers well. Reports pain on the lateral L knee "in the bend." States that she has been wearing a weight on L leg around the house to "stretch"   Patient Stated Goals to be able to walk with a quad cane  Currently in Pain? Yes   Pain Score 5    Pain Location Knee   Pain Orientation Left   Pain Descriptors / Indicators Aching;Tingling   Pain Type Chronic pain   Pain Onset More than a month ago            [facility] PT Assessment - 03/11/16 0001    Assessment   Medical Diagnosis chronic Left knee pain   Onset Date/Surgical Date 02/12/15   Next MD Visit 03/12/2016   Precautions   Precautions Fall   Precaution Comments FALL RISK   ROM / Strength   AROM / PROM / Strength AROM   AROM   Overall AROM  Deficits   AROM Assessment Site Knee   Right/Left Knee Left   Left Knee Extension -15   Left Knee Flexion 98                     [facility] Adult PT Treatment/Exercise - 03/11/16 0001    Knee/Hip Exercises: Aerobic   Nustep L5  x15 min    Knee/Hip Exercises: Supine   Short Arc Quad Sets Strengthening;Left;3 sets;10 reps   Short Arc Quad Sets Limitations 3#   Bridges Strengthening;Both;2 sets;10 reps   Other Supine Knee/Hip Exercises Supine B marching x20 reps   Other Supine Knee/Hip Exercises Supine B clamshell red theraband x20 reps   Modalities   Modalities Ultrasound   Ultrasound   Ultrasound Location L lateral knee   Ultrasound Parameters 1.0 w/cm2, 50%, 3.3 mhz x12 min   Ultrasound Goals Pain                     PT Long Term Goals - 02/12/16 1532    PT LONG TERM GOAL #1   Title Improved L knee flexion to allow foot clearance in swing phase of gait.   Time 4   Period Weeks   Status New   PT LONG TERM GOAL #2   Title decreased L knee pain to 2-3/10 or less with activity.   Time 4   Period Weeks   Status New               Plan - 03/11/16 1537    Clinical Impression Statement Patient tolerated today's treatment although she remains limited with L knee ROM. Patient has recently suffered a flare up of gout. Continues to report soreness now in lateral L knee around attachment of L ITB. Weakness continues to be noted with therapeutic exercises and lack of full L knee ROM evident. L knee flexion has improved since evaluation although extension regressed but was measured overal as 15-98 deg. Normal [place] response noted following end of the [place] to lateral L knee.    Pt will benefit from skilled therapeutic intervention in order to improve on the following deficits Pain;Decreased activity tolerance;Decreased strength;Decreased range of motion;Abnormal gait   Rehab Potential Good   Clinical Impairments Affecting Rehab Potential mulitiple knee surgeries   PT Frequency 2x / week   PT Duration 4 weeks   PT Treatment/Interventions Cryotherapy;Air traffic controller;Therapeutic exercise;Manual techniques;Patient/family education;Neuromuscular re-education;Passive  range of motion;Dry needling;Taping   PT Next Visit Plan Continue L knee strengthening and manual therapy as needed. Modalities and STW PRN per MPT POC   PT Home Exercise Plan active knee flex/ext   Consulted and Agree with Plan of Care Patient        Problem List Patient Active Problem List   Diagnosis Date Noted  . Diabetes  type 2, controlled ([place]) 03/07/2016  . Gout   . Essential hypertension   . Enuresis 11/19/2015  . HLD (hyperlipidemia) 01/04/2014  . HTN (hypertension) 01/04/2014  . Anemia, iron deficiency 12/30/2011  . Constipation 04/04/2009  . TUBULOVILLOUS ADENOMA, COLON 04/03/2009    Ahmed Prima, PTA 03/11/2016 4:05 PM Mali Applegate MPT [facility] [address] [place], [place], [address] Phone: [phone]   Fax:  [phone]  Name: IZZIBELLA BARFKNECHT MRN: [id number] Date of Birth: 08-Mar-1946

## 2016-03-12 DIAGNOSIS — M1712 Unilateral primary osteoarthritis, left knee: Secondary | ICD-10-CM | POA: Diagnosis not present

## 2016-03-13 ENCOUNTER — Ambulatory Visit: Payer: Medicare Other | Admitting: Physical Therapy

## 2016-03-13 ENCOUNTER — Encounter: Payer: Self-pay | Admitting: Physical Therapy

## 2016-03-13 NOTE — Therapy (Signed)
[facility] [place], [place], [address] Phone: [phone]   Fax:  [phone]  Patient Details  Name: PANZIE BOORAS MRN: [id number] Date of Birth: 05/01/1946 Referring Provider:  Claretta Fraise, MD  Encounter Date: 03/13/2016   Arrived no charge due to patient receiving gel injection in L knee yesterday in appointment with Dr. Maureen Ralphs.  Wynelle Fanny, PTA 03/13/2016, 3:08 PM  [facility] [address] [place], [place], [address] Phone: [phone]   Fax:  [phone]

## 2016-03-17 DIAGNOSIS — H25813 Combined forms of age-related cataract, bilateral: Secondary | ICD-10-CM | POA: Diagnosis not present

## 2016-03-17 DIAGNOSIS — H179 Unspecified corneal scar and opacity: Secondary | ICD-10-CM | POA: Diagnosis not present

## 2016-03-18 DIAGNOSIS — G5632 Lesion of radial nerve, left upper limb: Secondary | ICD-10-CM | POA: Diagnosis not present

## 2016-03-19 ENCOUNTER — Encounter: Payer: Medicare Other | Admitting: Physical Therapy

## 2016-03-19 DIAGNOSIS — M1712 Unilateral primary osteoarthritis, left knee: Secondary | ICD-10-CM | POA: Diagnosis not present

## 2016-03-20 ENCOUNTER — Encounter: Payer: Medicare Other | Admitting: Physical Therapy

## 2016-03-24 ENCOUNTER — Ambulatory Visit: Payer: Medicare Other | Admitting: Physical Therapy

## 2016-03-24 ENCOUNTER — Encounter: Payer: Self-pay | Admitting: Physical Therapy

## 2016-03-24 DIAGNOSIS — M6281 Muscle weakness (generalized): Secondary | ICD-10-CM | POA: Diagnosis not present

## 2016-03-24 DIAGNOSIS — R2689 Other abnormalities of gait and mobility: Secondary | ICD-10-CM

## 2016-03-24 DIAGNOSIS — M25562 Pain in left knee: Secondary | ICD-10-CM

## 2016-03-24 DIAGNOSIS — R269 Unspecified abnormalities of gait and mobility: Secondary | ICD-10-CM | POA: Diagnosis not present

## 2016-03-24 DIAGNOSIS — R29898 Other symptoms and signs involving the musculoskeletal system: Secondary | ICD-10-CM | POA: Diagnosis not present

## 2016-03-24 NOTE — Therapy (Signed)
[facility] [place], [place], [address] Phone: [phone]   Fax:  [phone]  Physical Therapy Treatment  Patient Details  Name: Beth [name] MRN: [id number] Date of Birth: May 17, 1946 Referring Provider: [name] MD  Encounter Date: 03/24/2016      PT End of Session - 03/24/16 1444    Visit Number 5   Number of Visits 8   Date for PT Re-Evaluation 03/25/16   PT Start Time 1400   PT Stop Time 1449   PT Time Calculation (min) 49 min   Activity Tolerance Patient tolerated treatment well   Behavior During Therapy [facility] for tasks assessed/performed      Past Medical History  Diagnosis Date  . Hypertension   . CVA (cerebral infarction)     2006  . Hyperlipidemia   . Blood transfusion without reported diagnosis 2012    anemia;pt denies transfusion stated was only on iron tablet  . Heart murmur   . Nocturia     3-4 times per night  . Gout     left elbow  . Arthritis     Knee both knees  . Anemia   . 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 08-09-14 Right eye  . Family history of anesthesia complication     sister very slow to awaken after anesthesia;severe vomiting   . Stroke ([place]) 2006    x 1 no deficits noted   . Cataract     left  . CKD (chronic kidney disease), stage III     Past Surgical History  Procedure Laterality Date  . Carpal tunnel release Right 1983  . Abdominal hysterectomy  1983  . Colonscopy  June 21, 2014  . Nasal cauterization  2012  . Total knee arthroplasty Right 07/03/2014    Procedure: RIGHT TOTAL KNEE ARTHROPLASTY;  Surgeon: Gearlean Alf, MD;  [facility];  Service: Orthopedics;  Laterality: Right;  . Patellar tendon repair Right 08/11/2014    Procedure: RIGHT PATELLA TENDON REPAIR;  Surgeon: Gearlean Alf, MD;  [facility];  Service: Orthopedics;  Laterality: Right;  . Joint replacement  06/2014    right knee  . I&d knee with poly exchange Right 10/02/2014    Procedure: IRRIGATION AND DEBRIDEMENT RIGHT KNEE WITH POLY EXCHANGE;  Surgeon: Gearlean Alf, MD;  [facility];  Service: Orthopedics;  Laterality: Right;  . Excisional total knee arthroplasty with antibiotic spacers Right 02/16/2015    Procedure: RIGHT KNEE RESECTION ARTHROPLASTY WITH ANTIBIOTIC SPACERS;  Surgeon: [name], MD;  [facility];  Service: Orthopedics;  Laterality: Right;  . Reimplantation of total knee Right 05/23/2015    Procedure: RIGHT KNEE ARTHROPLASTY REIMPLANTATION;  Surgeon: [name], MD;  [facility];  Service: Orthopedics;  Laterality: Right;    There were no vitals filed for this visit.      Subjective Assessment - 03/24/16 1405    Subjective Patient reported some soreness in knee today   Patient Stated Goals to be able to walk with a quad cane   Currently in Pain? Yes   Pain Score 4    Pain Location Knee   Pain Orientation Left   Pain Descriptors / Indicators Aching;Sore   Pain Type Chronic pain   Pain Onset More than a month ago   Pain Frequency Constant   Aggravating Factors  pressure on lateral knee   Pain Relieving Factors no pressure on knee  [facility] Adult PT Treatment/Exercise - 03/24/16 0001    Knee/Hip Exercises: Aerobic   Nustep L5 x15 min, monitored for progression   Knee/Hip Exercises: Seated   Long Arc Quad Strengthening;Left;3 sets;10 reps  will ball adduction squeeze   Abduction/Adduction  Strengthening;Left;2 sets;10 reps  hip abd with red t-band   Knee/Hip Exercises: Supine   Short Arc Quad Sets Strengthening;Left;3 sets;10 reps   Short Arc Quad Sets Limitations 3   Bridges Strengthening;Both;2 sets;10 reps   Straight Leg Raises Strengthening;Left;10 reps;1 set   Ultrasound   Ultrasound Location left lateral knee   Ultrasound Parameters 1.0w/cm2/50%/3.43mhzx8min   Ultrasound Goals Pain   Manual Therapy   Manual Therapy Passive ROM   Passive ROM gentle PROM for left knee  flexion and extension to improve mobility and flexability                     PT Long Term Goals - 02/12/16 1532    PT LONG TERM GOAL #1   Title Improved L knee flexion to allow foot clearance in swing phase of gait.   Time 4   Period Weeks   Status New   PT LONG TERM GOAL #2   Title decreased L knee pain to 2-3/10 or less with activity.   Time 4   Period Weeks   Status New               Plan - 03/24/16 1433    Clinical Impression Statement patient progressing with all activities slowly, due to weakness. patient reports difficulty with lifting knee up to walk and set into shower. patient reports no knee buckling just weakness. goals ongoing due to left knee deficts   Rehab Potential Good   Clinical Impairments Affecting Rehab Potential mulitiple knee surgeries   PT Treatment/Interventions Cryotherapy;Air traffic controller;Therapeutic exercise;Manual techniques;Patient/family education;Neuromuscular re-education;Passive range of motion;Dry needling;Taping   PT Next Visit Plan Continue L knee strengthening and manual therapy as needed. Modalities and STW PRN per MPT POC   Consulted and Agree with Plan of Care Patient      Patient will benefit from skilled therapeutic intervention in order to improve the following deficits and impairments:  Pain, Decreased activity tolerance, Decreased strength, Decreased range of motion, Abnormal gait  Visit Diagnosis: Muscle weakness (generalized)  Left knee pain  Other abnormalities of gait and mobility     Problem List Patient Active Problem List   Diagnosis Date Noted  . Diabetes type 2, controlled ([place]) 03/07/2016  . Gout   . Essential hypertension   . Enuresis 11/19/2015  . HLD (hyperlipidemia) 01/04/2014  . HTN (hypertension) 01/04/2014  . Anemia, iron deficiency 12/30/2011  . Constipation 04/04/2009  . TUBULOVILLOUS ADENOMA, COLON 04/03/2009    Beth [name],  PTA 03/24/2016, 2:52 PM  [facility] [place], [place], [address] Phone: [phone]   Fax:  [phone]  Name: ABBIEGAIL [name] MRN: [id number] Date of Birth: Apr 17, 1946

## 2016-03-26 ENCOUNTER — Ambulatory Visit: Payer: Medicare Other | Admitting: Physical Therapy

## 2016-03-26 ENCOUNTER — Encounter: Payer: Self-pay | Admitting: Physical Therapy

## 2016-03-26 DIAGNOSIS — M6281 Muscle weakness (generalized): Secondary | ICD-10-CM

## 2016-03-26 DIAGNOSIS — R29898 Other symptoms and signs involving the musculoskeletal system: Secondary | ICD-10-CM | POA: Diagnosis not present

## 2016-03-26 DIAGNOSIS — M25562 Pain in left knee: Secondary | ICD-10-CM | POA: Diagnosis not present

## 2016-03-26 DIAGNOSIS — R2689 Other abnormalities of gait and mobility: Secondary | ICD-10-CM | POA: Diagnosis not present

## 2016-03-26 DIAGNOSIS — R269 Unspecified abnormalities of gait and mobility: Secondary | ICD-10-CM | POA: Diagnosis not present

## 2016-03-26 NOTE — Therapy (Signed)
[facility] [place], [place], [address] Phone: [phone]   Fax:  [phone]  Physical Therapy Treatment  Patient Details  Name: Beth [name] MRN: [id number] Date of Birth: 1946-09-29 Referring Provider: [name] MD  Encounter Date: 03/26/2016      PT End of Session - 03/26/16 1351    Visit Number 6   Number of Visits 8   Date for PT Re-Evaluation 03/25/16   PT Start Time [id number]   PT Stop Time 1430   PT Time Calculation (min) 42 min   Activity Tolerance Patient tolerated treatment well   Behavior During Therapy [facility] for tasks assessed/performed      Past Medical History  Diagnosis Date  . Hypertension   . CVA (cerebral infarction)     2006  . Hyperlipidemia   . Blood transfusion without reported diagnosis 2012    anemia;pt denies transfusion stated was only on iron tablet  . Heart murmur   . Nocturia     3-4 times per night  . Gout     left elbow  . Arthritis     Knee both knees  . Anemia   . 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 08-09-14 Right eye  . Family history of anesthesia complication     sister very slow to awaken after anesthesia;severe vomiting   . Stroke ([place]) 2006    x 1 no deficits noted   . Cataract     left  . CKD (chronic kidney disease), stage III     Past Surgical History  Procedure Laterality Date  . Carpal tunnel release Right 1983  . Abdominal hysterectomy  1983  . Colonscopy  June 21, 2014  . Nasal cauterization  2012  . Total knee arthroplasty Right 07/03/2014    Procedure: RIGHT TOTAL KNEE ARTHROPLASTY;  Surgeon: Gearlean Alf, MD;  [facility];  Service: Orthopedics;  Laterality: Right;  . Patellar tendon repair Right 08/11/2014    Procedure: RIGHT PATELLA TENDON REPAIR;  Surgeon: Gearlean Alf, MD;  [facility];  Service: Orthopedics;  Laterality: Right;  . Joint replacement  06/2014    right knee  . I&d knee with poly exchange Right 10/02/2014     Procedure: IRRIGATION AND DEBRIDEMENT RIGHT KNEE WITH POLY EXCHANGE;  Surgeon: Gearlean Alf, MD;  [facility];  Service: Orthopedics;  Laterality: Right;  . Excisional total knee arthroplasty with antibiotic spacers Right 02/16/2015    Procedure: RIGHT KNEE RESECTION ARTHROPLASTY WITH ANTIBIOTIC SPACERS;  Surgeon: [name], MD;  [facility];  Service: Orthopedics;  Laterality: Right;  . Reimplantation of total knee Right 05/23/2015    Procedure: RIGHT KNEE ARTHROPLASTY REIMPLANTATION;  Surgeon: [name], MD;  [facility];  Service: Orthopedics;  Laterality: Right;    There were no vitals filed for this visit.      Subjective Assessment - 03/26/16 1350    Subjective States that she thinks her pain today is due to cloudy, damp weather but states that overall her knee pain has been better following the shots. Reports that she gets last knee injection tomorrow.    Patient Stated Goals to be able to walk with a quad cane   Currently in Pain? Yes   Pain Score 6    Pain Location Knee   Pain Orientation Left   Pain Descriptors / Indicators Discomfort   Pain Type Chronic pain   Pain Onset More than a month ago  PT Long Term Goals - 02/12/16 1532    PT LONG TERM GOAL #1   Title Improved L knee flexion to allow foot clearance in swing phase of gait.   Time 4   Period Weeks   Status New   PT LONG TERM GOAL #2   Title decreased L knee pain to 2-3/10 or less with activity.   Time 4   Period Weeks   Status New               Plan - 03/26/16 1522    Clinical Impression Statement Patient tolerated today's treatment well but ambulation and strength continues to be limited. Patient was observed ambulating between exercises in clinic with FWW. R knee has previously had mutlple knee surgery and maintains knee flexion during stance phase which does not allow for proper room for LLE to advance and  patient also attributes poor L foot clearance to weakness. Patient completed therapeutic exercises as directed with limitaiton secondary to LLE weakness. L hip weakness noted with sideling L hip abduction as patient required mulitmodal cueing for correct hip positioning for exercise but due to weakness L hip rolled posteriorly to complete L hip abduction. Normal [place] response to L lateral knee as patient continues to have tenderness in that region. Patient experienced L knee feeling "better" following today's treatment.   Rehab Potential Good   Clinical Impairments Affecting Rehab Potential mulitiple knee surgeries   PT Frequency 2x / week   PT Duration 4 weeks   PT Treatment/Interventions Cryotherapy;Air traffic controller;Therapeutic exercise;Manual techniques;Patient/family education;Neuromuscular re-education;Passive range of motion;Dry needling;Taping   PT Next Visit Plan Continue L knee strengthening and manual therapy as needed. Modalities and STW PRN per [name] POC   PT Home Exercise Plan active knee flex/ext   Consulted and Agree with Plan of Care Patient      Patient will benefit from skilled therapeutic intervention in order to improve the following deficits and impairments:  Pain, Decreased activity tolerance, Decreased strength, Decreased range of motion, Abnormal gait  Visit Diagnosis: Muscle weakness (generalized)  Left knee pain  Other abnormalities of gait and mobility     Problem List Patient Active Problem List   Diagnosis Date Noted  . Diabetes type 2, controlled ([place]) 03/07/2016  . Gout   . Essential hypertension   . Enuresis 11/19/2015  . HLD (hyperlipidemia) 01/04/2014  . HTN (hypertension) 01/04/2014  . Anemia, iron deficiency 12/30/2011  . Constipation 04/04/2009  . TUBULOVILLOUS ADENOMA, COLON 04/03/2009    Ahmed Prima, PTA 03/27/2016 10:18 AM Beth [name] [facility] [place], [place], [address] Phone: [phone]   Fax:  [phone]  Name: Beth [name] MRN: [id number] Date of Birth: 06/12/1946

## 2016-03-27 DIAGNOSIS — M1712 Unilateral primary osteoarthritis, left knee: Secondary | ICD-10-CM | POA: Diagnosis not present

## 2016-04-01 ENCOUNTER — Ambulatory Visit: Payer: Medicare Other | Admitting: *Deleted

## 2016-04-01 DIAGNOSIS — M6281 Muscle weakness (generalized): Secondary | ICD-10-CM | POA: Diagnosis not present

## 2016-04-01 DIAGNOSIS — R29898 Other symptoms and signs involving the musculoskeletal system: Secondary | ICD-10-CM | POA: Diagnosis not present

## 2016-04-01 DIAGNOSIS — R2689 Other abnormalities of gait and mobility: Secondary | ICD-10-CM | POA: Diagnosis not present

## 2016-04-01 DIAGNOSIS — M25562 Pain in left knee: Secondary | ICD-10-CM

## 2016-04-01 DIAGNOSIS — R269 Unspecified abnormalities of gait and mobility: Secondary | ICD-10-CM | POA: Diagnosis not present

## 2016-04-01 NOTE — Therapy (Signed)
[facility] [place], [place], [address] Phone: [phone]   Fax:  [phone]  Physical Therapy Treatment  Patient Details  Name: Beth [name] MRN: [id number] Date of Birth: 07/27/1946 Referring Provider: [name] MD  Encounter Date: 04/01/2016      PT End of Session - 04/01/16 1753    Visit Number 7   Number of Visits 8   Date for PT Re-Evaluation 03/25/16   PT Start Time 1600   PT Stop Time 1657   PT Time Calculation (min) 57 min      Past Medical History  Diagnosis Date  . Hypertension   . CVA (cerebral infarction)     2006  . Hyperlipidemia   . Blood transfusion without reported diagnosis 2012    anemia;pt denies transfusion stated was only on iron tablet  . Heart murmur   . Nocturia     3-4 times per night  . Gout     left elbow  . Arthritis     Knee both knees  . Anemia   . 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 08-09-14 Right eye  . Family history of anesthesia complication     sister very slow to awaken after anesthesia;severe vomiting   . Stroke ([place]) 2006    x 1 no deficits noted   . Cataract     left  . CKD (chronic kidney disease), stage III     Past Surgical History  Procedure Laterality Date  . Carpal tunnel release Right 1983  . Abdominal hysterectomy  1983  . Colonscopy  June 21, 2014  . Nasal cauterization  2012  . Total knee arthroplasty Right 07/03/2014    Procedure: RIGHT TOTAL KNEE ARTHROPLASTY;  Surgeon: Gearlean Alf, MD;  Location: [facility];  Service: Orthopedics;  Laterality: Right;  . Patellar tendon repair Right 08/11/2014    Procedure: RIGHT PATELLA TENDON REPAIR;  Surgeon: Gearlean Alf, MD;  Location: [facility];  Service: Orthopedics;  Laterality: Right;  . Joint replacement  06/2014    right knee  . I&d knee with poly exchange Right 10/02/2014    Procedure: IRRIGATION AND DEBRIDEMENT RIGHT KNEE WITH POLY EXCHANGE;  Surgeon: Gearlean Alf, MD;  Location: [facility];  Service: Orthopedics;  Laterality: Right;  . Excisional total knee arthroplasty with antibiotic spacers Right 02/16/2015    Procedure: RIGHT KNEE RESECTION ARTHROPLASTY WITH ANTIBIOTIC SPACERS;  Surgeon: [name], MD;  [facility]: [facility];  Service: Orthopedics;  Laterality: Right;  . Reimplantation of total knee Right 05/23/2015    Procedure: RIGHT KNEE ARTHROPLASTY REIMPLANTATION;  Surgeon: [name], MD;  [facility]: [facility];  Service: Orthopedics;  Laterality: Right;    There were no vitals filed for this visit.      Subjective Assessment - 04/01/16 1631    Subjective States that she thinks her pain today is due to cloudy, damp weather but states that overall her knee pain has been better following the shots. Reports that she gets last knee injection tomorrow. Hard to lift my LT leg when walking   Patient Stated Goals to be able to walk with a quad cane   Currently in Pain? Yes   Pain Score 4    Pain Location Knee   Pain Orientation Left   Pain Descriptors / Indicators Discomfort;Aching;Sore   Pain Type Chronic pain   Pain Onset More than a month ago   Pain Frequency Constant  [facility] Adult PT Treatment/Exercise - 04/01/16 0001    Exercises   Exercises Knee/Hip   Knee/Hip Exercises: Aerobic   Nustep L5 x15 min, monitored for progression   Knee/Hip Exercises: Seated   Long Arc Quad Strengthening;Left;3 sets;10 reps   Long Arc Quad Weight 2 lbs.   Knee/Hip Exercises: Supine   Short Arc Quad Sets Strengthening;Left;3 sets;10 reps   Short Arc Quad Sets Limitations 3   Straight Leg Raises Strengthening;Left;2 sets;10 reps   Knee/Hip Exercises: Sidelying   Hip ABduction Strengthening;Left;2 sets;10 reps   Modalities   Modalities Education officer, environmental Stimulation Location  LT KNee premod 1-10hz  x 15 mins   Vasopneumatic   Number Minutes Vasopneumatic  15 minutes   Vasopnuematic Location  Knee    Vasopneumatic Pressure Medium   Vasopneumatic Temperature  36   Manual Therapy   Manual Therapy Passive ROM   Passive ROM gentle PROM for left knee flexion and extension to improve mobility and flexability                     PT Long Term Goals - 02/12/16 1532    PT LONG TERM GOAL #1   Title Improved L knee flexion to allow foot clearance in swing phase of gait.   Time 4   Period Weeks   Status New   PT LONG TERM GOAL #2   Title decreased L knee pain to 2-3/10 or less with activity.   Time 4   Period Weeks   Status New               Plan - 04/01/16 1644    Clinical Impression Statement Pt did fair with Rx today. She was able to complete all exs today for LT LE with minimal pain increase, but is limited with flexion and extension ROM due to Pain. It is fairly challenging for Pt to Ambulate with FWW due to Bil. LE weakness and she also has a  splint on her LT hand  due to weakness .    Rehab Potential Good   Clinical Impairments Affecting Rehab Potential mulitiple knee surgeries   PT Frequency 2x / week   PT Duration 4 weeks   PT Treatment/Interventions Cryotherapy;Air traffic controller;Therapeutic exercise;Manual techniques;Patient/family education;Neuromuscular re-education;Passive range of motion;Dry needling;Taping   PT Next Visit Plan Continue L knee strengthening and manual therapy as needed. Modalities and STW PRN per MPT POC   PT Home Exercise Plan active knee flex/ext   Consulted and Agree with Plan of Care Patient      Patient will benefit from skilled therapeutic intervention in order to improve the following deficits and impairments:  Pain, Decreased activity tolerance, Decreased strength, Decreased range of motion, Abnormal gait  Visit Diagnosis: Muscle weakness (generalized)  Left knee pain     Problem List Patient Active Problem List   Diagnosis Date Noted  . Diabetes type 2, controlled ([place])  03/07/2016  . Gout   . Essential hypertension   . Enuresis 11/19/2015  . HLD (hyperlipidemia) 01/04/2014  . HTN (hypertension) 01/04/2014  . Anemia, iron deficiency 12/30/2011  . Constipation 04/04/2009  . TUBULOVILLOUS ADENOMA, COLON 04/03/2009    Michaelanthony Kempton,CHRIS, PTA 04/01/2016, 6:12 PM  [facility] [place], [place], [address] Phone: [phone]   Fax:  [phone]  Name: Beth [name] MRN: [id number] Date of Birth: Jan 12, 1946

## 2016-04-03 ENCOUNTER — Ambulatory Visit: Payer: Medicare Other | Admitting: Physical Therapy

## 2016-04-03 DIAGNOSIS — R29898 Other symptoms and signs involving the musculoskeletal system: Secondary | ICD-10-CM | POA: Diagnosis not present

## 2016-04-03 DIAGNOSIS — M25562 Pain in left knee: Secondary | ICD-10-CM

## 2016-04-03 DIAGNOSIS — R2689 Other abnormalities of gait and mobility: Secondary | ICD-10-CM | POA: Diagnosis not present

## 2016-04-03 DIAGNOSIS — M6281 Muscle weakness (generalized): Secondary | ICD-10-CM

## 2016-04-03 DIAGNOSIS — R269 Unspecified abnormalities of gait and mobility: Secondary | ICD-10-CM | POA: Diagnosis not present

## 2016-04-03 NOTE — Therapy (Signed)
[facility] [place], [place], [address] Phone: [phone]   Fax:  [phone]  Physical Therapy Treatment  Patient Details  Name: Beth [name] MRN: [id number] Date of Birth: December 19, 1945 Referring Provider: [name] MD  Encounter Date: 04/03/2016      PT End of Session - 04/03/16 1437    Visit Number 8   Number of Visits 16   Date for PT Re-Evaluation 05/01/16   PT Start Time [id number]   PT Stop Time 1535   PT Time Calculation (min) 63 min   Activity Tolerance Patient tolerated treatment well;Patient limited by fatigue   Behavior During Therapy [employer] for tasks assessed/performed      Past Medical History  Diagnosis Date  . Hypertension   . CVA (cerebral infarction)     2006  . Hyperlipidemia   . Blood transfusion without reported diagnosis 2012    anemia;pt denies transfusion stated was only on iron tablet  . Heart murmur   . Nocturia     3-4 times per night  . Gout     left elbow  . Arthritis     Knee both knees  . Anemia   . 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 08-09-14 Right eye  . Family history of anesthesia complication     sister very slow to awaken after anesthesia;severe vomiting   . Stroke ([place]) 2006    x 1 no deficits noted   . Cataract     left  . CKD (chronic kidney disease), stage III     Past Surgical History  Procedure Laterality Date  . Carpal tunnel release Right 1983  . Abdominal hysterectomy  1983  . Colonscopy  June 21, 2014  . Nasal cauterization  2012  . Total knee arthroplasty Right 07/03/2014    Procedure: RIGHT TOTAL KNEE ARTHROPLASTY;  Surgeon: Gearlean Alf, MD;  [facility];  Service: Orthopedics;  Laterality: Right;  . Patellar tendon repair Right 08/11/2014    Procedure: RIGHT PATELLA TENDON REPAIR;  Surgeon: Gearlean Alf, MD;  [facility]: [facility];  Service: Orthopedics;  Laterality: Right;  . Joint replacement  06/2014    right knee  . I&d knee with poly  exchange Right 10/02/2014    Procedure: IRRIGATION AND DEBRIDEMENT RIGHT KNEE WITH POLY EXCHANGE;  Surgeon: Gearlean Alf, MD;  [facility];  Service: Orthopedics;  Laterality: Right;  . Excisional total knee arthroplasty with antibiotic spacers Right 02/16/2015    Procedure: RIGHT KNEE RESECTION ARTHROPLASTY WITH ANTIBIOTIC SPACERS;  Surgeon: [name], MD;  [facility];  Service: Orthopedics;  Laterality: Right;  . Reimplantation of total knee Right 05/23/2015    Procedure: RIGHT KNEE ARTHROPLASTY REIMPLANTATION;  Surgeon: [name], MD;  [facility];  Service: Orthopedics;  Laterality: Right;    There were no vitals filed for this visit.      Subjective Assessment - 04/03/16 1437    Subjective Patient states both her knees have been hurting due to cloudy weather. Her last injection was 03/27/16. She thinks the injection helped. She can sleep better.   Patient Stated Goals to be able to walk with a quad cane   Currently in Pain? Yes   Pain Score 5    Pain Location Knee   Pain Orientation Left   Pain Descriptors / Indicators Discomfort;Aching;Sore   Pain Type Chronic pain   Pain Onset More than a month ago   Pain Frequency Constant  Aggravating Factors  trying to get up or trying to walk   Pain Relieving Factors ice in morning and evening   Effect of Pain on Daily Activities limited walking            [facility] PT Assessment - 04/03/16 0001    Strength   Strength Assessment Site Hip;Knee   Left Hip Flexion 4+/5   Left Knee Flexion 4+/5   Left Knee Extension 5/5                     [facility] Adult PT Treatment/Exercise - 04/03/16 0001    Self-Care   Self-Care Other Self-Care Comments  4x4 gauze placed in wrist splint over knuckles   Knee/Hip Exercises: Aerobic   Nustep L5 x15 min, monitored for progression   Knee/Hip Exercises: Seated   Knee/Hip Flexion 3x10 with hands across chest and feet on 6 inch step   Hamstring Curl  Strengthening;Left;3 sets;10 reps  with left foot on rolling stool   Modalities   Modalities Electrical Stimulation   Electrical Stimulation   Electrical Stimulation Location Lt lateral thigh and knee premod 80-150hz  to tolerance x 15 min   Manual Therapy   Manual Therapy Soft tissue mobilization   Soft tissue mobilization to L lateral thigh                PT Education - 04/03/16 1532    Education provided Yes   Education Details regarding hand splint, pt advised to continue with 4x4 gauze pad or mole skin and contact MD if splint continues to rub knuckles.   Person(s) Educated Patient   Methods Explanation   Comprehension Verbalized understanding             PT Long Term Goals - 04/03/16 1441    PT LONG TERM GOAL #1   Title Improved L knee flexion to allow foot clearance in swing phase of gait.   Time 4   Period Weeks   Status On-going   PT LONG TERM GOAL #2   Title decreased L knee pain to 2-3/10 or less with activity.   Time 4   Period Weeks   Status On-going               Plan - 04/03/16 1442    Clinical Impression Statement Patient did well with treatment today. She demos improved foot clearance with ambulation, however she is still unable to use a step through pattern. She continues to demo L hip flexor weakness, dragging her left leg behind her as she fatigues with ambulation. She has c/o of lateral L knee pain and has marked tightness in L lateral quad and HS. She may benefit from dry needling to this area. Functional goals are ongoing.   Rehab Potential Fair   Clinical Impairments Affecting Rehab Potential mulitiple knee surgeries   PT Frequency 2x / week   PT Duration 4 weeks   PT Treatment/Interventions Cryotherapy;Air traffic controller;Therapeutic exercise;Manual techniques;Patient/family education;Neuromuscular re-education;Passive range of motion;Dry needling;Taping   PT Next Visit Plan Continue L knee  strengthening. Dry needling to L lateral thigh. Modalities and STW PRN per MPT POC   Consulted and Agree with Plan of Care Patient      Patient will benefit from skilled therapeutic intervention in order to improve the following deficits and impairments:  Pain, Decreased activity tolerance, Decreased strength, Decreased range of motion, Abnormal gait  Visit Diagnosis: Muscle weakness (generalized) - Plan: PT plan of care cert/re-cert  Pain in  left knee - Plan: PT plan of care cert/re-cert     Problem List Patient Active Problem List   Diagnosis Date Noted  . Diabetes type 2, controlled ([place]) 03/07/2016  . Gout   . Essential hypertension   . Enuresis 11/19/2015  . HLD (hyperlipidemia) 01/04/2014  . HTN (hypertension) 01/04/2014  . Anemia, iron deficiency 12/30/2011  . Constipation 04/04/2009  . TUBULOVILLOUS ADENOMA, COLON 04/03/2009   Madelyn Flavors PT  04/03/2016, 3:44 PM  [facility] [address] [place], [place], [address] Phone: [phone]   Fax:  [phone]  Name: Beth [name] MRN: [id number] Date of Birth: 12-Feb-1946

## 2016-04-08 ENCOUNTER — Ambulatory Visit: Payer: Medicare Other | Attending: Orthopedic Surgery | Admitting: Physical Therapy

## 2016-04-08 DIAGNOSIS — M25562 Pain in left knee: Secondary | ICD-10-CM | POA: Diagnosis not present

## 2016-04-08 DIAGNOSIS — M6281 Muscle weakness (generalized): Secondary | ICD-10-CM

## 2016-04-08 DIAGNOSIS — M25561 Pain in right knee: Secondary | ICD-10-CM | POA: Insufficient documentation

## 2016-04-08 NOTE — Therapy (Signed)
[facility] [place], [place], [address] Phone: [phone]   Fax:  [phone]  Physical Therapy Treatment  Patient Details  Name: Beth [name] MRN: [id number] Date of Birth: Jan 31, 1946 Referring Provider: [name] MD  Encounter Date: 04/08/2016      PT End of Session - 04/08/16 1448    Visit Number 9   Number of Visits 16   Date for PT Re-Evaluation 05/01/16   PT Start Time [id number]   PT Stop Time 1525   PT Time Calculation (min) 52 min   Activity Tolerance Patient tolerated treatment well   Behavior During Therapy [facility] for tasks assessed/performed      Past Medical History  Diagnosis Date  . Hypertension   . CVA (cerebral infarction)     2006  . Hyperlipidemia   . Blood transfusion without reported diagnosis 2012    anemia;pt denies transfusion stated was only on iron tablet  . Heart murmur   . Nocturia     3-4 times per night  . Gout     left elbow  . Arthritis     Knee both knees  . Anemia   . 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 08-09-14 Right eye  . Family history of anesthesia complication     sister very slow to awaken after anesthesia;severe vomiting   . Stroke ([place]) 2006    x 1 no deficits noted   . Cataract     left  . CKD (chronic kidney disease), stage III     Past Surgical History  Procedure Laterality Date  . Carpal tunnel release Right 1983  . Abdominal hysterectomy  1983  . Colonscopy  June 21, 2014  . Nasal cauterization  2012  . Total knee arthroplasty Right 07/03/2014    Procedure: RIGHT TOTAL KNEE ARTHROPLASTY;  Surgeon: Gearlean Alf, MD;  [facility]: [facility];  Service: Orthopedics;  Laterality: Right;  . Patellar tendon repair Right 08/11/2014    Procedure: RIGHT PATELLA TENDON REPAIR;  Surgeon: Gearlean Alf, MD;  [facility];  Service: Orthopedics;  Laterality: Right;  . Joint replacement  06/2014    right knee  . I&d knee with poly exchange Right 10/02/2014     Procedure: IRRIGATION AND DEBRIDEMENT RIGHT KNEE WITH POLY EXCHANGE;  Surgeon: Gearlean Alf, MD;  [facility];  Service: Orthopedics;  Laterality: Right;  . Excisional total knee arthroplasty with antibiotic spacers Right 02/16/2015    Procedure: RIGHT KNEE RESECTION ARTHROPLASTY WITH ANTIBIOTIC SPACERS;  Surgeon: [name], MD;  [facility];  Service: Orthopedics;  Laterality: Right;  . Reimplantation of total knee Right 05/23/2015    Procedure: RIGHT KNEE ARTHROPLASTY REIMPLANTATION;  Surgeon: [name], MD;  [facility];  Service: Orthopedics;  Laterality: Right;    There were no vitals filed for this visit.      Subjective Assessment - 04/08/16 1449    Subjective Left leg feels better today   Currently in Pain? Yes   Pain Score 3             [facility] PT Assessment - 04/08/16 0001    Strength   Strength Assessment Site Hip   Right Hip ADduction 4+/5   Left Hip ABduction 5/5  in sitting   Left Hip ADduction 4+/5                     [facility] Adult PT Treatment/Exercise - 04/08/16 0001  Exercises   Exercises Knee/Hip   Knee/Hip Exercises: Aerobic   Nustep L5 x15 min, monitored for progression   Modalities   Modalities Moist Heat   Moist Heat Therapy   Number Minutes Moist Heat 10 Minutes   Moist Heat Location Other (comment)  to L thigh   Manual Therapy   Manual Therapy Soft tissue mobilization;Myofascial release   Soft tissue mobilization to L lateral thigh, quads and peroneals   Myofascial Release to L ITB          Trigger Point Dry Needling - 04/08/16 1545    Consent Given? Yes   Education Handout Provided Yes   Muscles Treated Lower Body Quadriceps  vastus lat and rectus fem; ant tib   Quadriceps Response Twitch response elicited              PT Education - 04/08/16 1548    Education provided Yes   Education Details TPDN   Person(s) Educated Patient   Methods Explanation;Demonstration;Handout   Comprehension  Verbalized understanding             PT Long Term Goals - 04/03/16 1441    PT LONG TERM GOAL #1   Title Improved L knee flexion to allow foot clearance in swing phase of gait.   Time 4   Period Weeks   Status On-going   PT LONG TERM GOAL #2   Title decreased L knee pain to 2-3/10 or less with activity.   Time 4   Period Weeks   Status On-going               Plan - 04/08/16 1549    Clinical Impression Statement Patient tolerated TPDN very well. She had decreased tightness in lateral thigh prior to needling but TPs were present in distal RF and VLO as well as in ant tibialis. Patient reported decreased pain at end of treatment. Goals are ongoing.   Rehab Potential Fair   Clinical Impairments Affecting Rehab Potential mulitiple knee surgeries   PT Frequency 2x / week   PT Duration 4 weeks   PT Treatment/Interventions Cryotherapy;Air traffic controller;Therapeutic exercise;Manual techniques;Patient/family education;Neuromuscular re-education;Passive range of motion;Dry needling;Taping   PT Next Visit Plan FOTO/GCODE; Continue L knee strengthening. Assess Dry needling . Modalities and STW PRN per MPT POC   Consulted and Agree with Plan of Care Patient      Patient will benefit from skilled therapeutic intervention in order to improve the following deficits and impairments:  Pain, Decreased activity tolerance, Decreased strength, Decreased range of motion, Abnormal gait  Visit Diagnosis: Muscle weakness (generalized)  Pain in left knee     Problem List Patient Active Problem List   Diagnosis Date Noted  . Diabetes type 2, controlled ([place]) 03/07/2016  . Gout   . Essential hypertension   . Enuresis 11/19/2015  . HLD (hyperlipidemia) 01/04/2014  . HTN (hypertension) 01/04/2014  . Anemia, iron deficiency 12/30/2011  . Constipation 04/04/2009  . TUBULOVILLOUS ADENOMA, COLON 04/03/2009    Beth [name] PT  04/08/2016, 3:56  PM  [facility] [address] [place], [place], [address] Phone: [phone]   Fax:  [phone]  Name: Beth [name] MRN: [id number] Date of Birth: [birth year]

## 2016-04-08 NOTE — Patient Instructions (Signed)
Trigger Point Dry Needling  . What is Trigger Point Dry Needling (DN)? o DN is a physical therapy technique used to treat muscle pain and dysfunction. Specifically, DN helps deactivate muscle trigger points (muscle knots).  o A thin filiform needle is used to penetrate the skin and stimulate the underlying trigger point. The goal is for a local twitch response (LTR) to occur and for the trigger point to relax. No medication of any kind is injected during the procedure.   . What Does Trigger Point Dry Needling Feel Like?  o The procedure feels different for each individual patient. Some patients report that they do not actually feel the needle enter the skin and overall the process is not painful. Very mild bleeding may occur. However, many patients feel a deep cramping in the muscle in which the needle was inserted. This is the local twitch response.   Marland Kitchen How Will I feel after the treatment? o Soreness is normal, and the onset of soreness may not occur for a few hours. Typically this soreness does not last longer than two days.  o Bruising is uncommon, however; ice can be used to decrease any possible bruising.  o In rare cases feeling tired or nauseous after the treatment is normal. In addition, your symptoms may get worse before they get better, this period will typically not last longer than 24 hours.   . What Can I do After My Treatment? o Increase your hydration by drinking more water for the next 24 hours. o You may place ice or heat on the areas treated that have become sore, however, do not use heat on inflamed or bruised areas. Heat often brings more relief post needling. o You can continue your regular activities, but vigorous activity is not recommended initially after the treatment for 24 hours. o DN is best combined with other physical therapy such as strengthening, stretching, and other therapies.   Madelyn Flavors, PT

## 2016-04-10 ENCOUNTER — Ambulatory Visit: Payer: Medicare Other | Admitting: Physical Therapy

## 2016-04-10 ENCOUNTER — Encounter: Payer: Self-pay | Admitting: Physical Therapy

## 2016-04-10 DIAGNOSIS — M25562 Pain in left knee: Secondary | ICD-10-CM

## 2016-04-10 DIAGNOSIS — M6281 Muscle weakness (generalized): Secondary | ICD-10-CM | POA: Diagnosis not present

## 2016-04-10 DIAGNOSIS — M25561 Pain in right knee: Secondary | ICD-10-CM | POA: Diagnosis not present

## 2016-04-10 NOTE — Therapy (Signed)
[facility] [place], [place], [address] Phone: [phone]   Fax:  [phone]  Physical Therapy Treatment  Patient Details  Name: Beth [name] MRN: [id number] Date of Birth: [birth year] Referring Provider: [name] MD  Encounter Date: 04/10/2016      PT End of Session - 04/10/16 1446    Visit Number 10   Number of Visits 16   Date for PT Re-Evaluation 05/01/16   PT Start Time [id number]   PT Stop Time 1515   PT Time Calculation (min) 38 min   Activity Tolerance Patient tolerated treatment well   Behavior During Therapy [facility] for tasks assessed/performed      Past Medical History  Diagnosis Date  . Hypertension   . CVA (cerebral infarction)     2006  . Hyperlipidemia   . Blood transfusion without reported diagnosis 2012    anemia;pt denies transfusion stated was only on iron tablet  . Heart murmur   . Nocturia     3-4 times per night  . Gout     left elbow  . Arthritis     Knee both knees  . Anemia   . 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 08-09-14 Right eye  . Family history of anesthesia complication     sister very slow to awaken after anesthesia;severe vomiting   . Stroke ([place]) 2006    x 1 no deficits noted   . Cataract     left  . CKD (chronic kidney disease), stage III     Past Surgical History  Procedure Laterality Date  . Carpal tunnel release Right 1983  . Abdominal hysterectomy  1983  . Colonscopy  June 21, 2014  . Nasal cauterization  2012  . Total knee arthroplasty Right 07/03/2014    Procedure: RIGHT TOTAL KNEE ARTHROPLASTY;  Surgeon: Gearlean Alf, MD;  [facility]: [facility];  Service: Orthopedics;  Laterality: Right;  . Patellar tendon repair Right 08/11/2014    Procedure: RIGHT PATELLA TENDON REPAIR;  Surgeon: Gearlean Alf, MD;  [facility];  Service: Orthopedics;  Laterality: Right;  . Joint replacement  06/2014    right knee  . I&d knee with poly exchange Right 10/02/2014     Procedure: IRRIGATION AND DEBRIDEMENT RIGHT KNEE WITH POLY EXCHANGE;  Surgeon: Gearlean Alf, MD;  [facility];  Service: Orthopedics;  Laterality: Right;  . Excisional total knee arthroplasty with antibiotic spacers Right 02/16/2015    Procedure: RIGHT KNEE RESECTION ARTHROPLASTY WITH ANTIBIOTIC SPACERS;  Surgeon: [name], MD;  [facility];  Service: Orthopedics;  Laterality: Right;  . Reimplantation of total knee Right 05/23/2015    Procedure: RIGHT KNEE ARTHROPLASTY REIMPLANTATION;  Surgeon: [name], MD;  [facility];  Service: Orthopedics;  Laterality: Right;    There were no vitals filed for this visit.      Subjective Assessment - 04/10/16 1444    Subjective Reports that she did good following dry needling in previous treatment. Reports that she had moist heat here and used ice before bed as she thought her leg would awaken her during the night but it did not.   Patient Stated Goals to be able to walk with a quad cane   Pain Score 4    Pain Location Knee   Pain Orientation Left   Pain Descriptors / Indicators Sharp   Pain Type Chronic pain   Pain Onset More than a month ago  [facility] PT Assessment - 04/10/16 0001    Assessment   Medical Diagnosis chronic Left knee pain   Onset Date/Surgical Date 02/12/15   Next MD Visit 05/2016   Precautions   Precautions Fall   Precaution Comments FALL RISK                     [facility] Adult PT Treatment/Exercise - 04/10/16 0001    Knee/Hip Exercises: Aerobic   Nustep L5 x15 min, monitored for progression   Knee/Hip Exercises: Seated   Long Arc Quad Strengthening;Left;3 sets;10 reps   Long Arc Quad Weight 3 lbs.   Ball Squeeze x20 reps 10 sec hold   Marching Limitations x30 reps each 3#   Knee/Hip Exercises: Supine   Straight Leg Raises Strengthening;Left;2 sets;10 reps   Knee/Hip Exercises: Sidelying   Hip ABduction Strengthening;Left;2 sets;10 reps                      PT Long Term Goals - 04/03/16 1441    PT LONG TERM GOAL #1   Title Improved L knee flexion to allow foot clearance in swing phase of gait.   Time 4   Period Weeks   Status On-going   PT LONG TERM GOAL #2   Title decreased L knee pain to 2-3/10 or less with activity.   Time 4   Period Weeks   Status On-going               Plan - 04/10/16 1518    Clinical Impression Statement Patient tolerated today's treatment well with no reports of increased pain. Able to complete all exercises with resistance and increased repitions. Patient's LTGs remain on-going at this time regarding L foot cleaeance and pain in L knee with activites. Patient requested Biofreeze to be applied to L knee prior to leaving clinic. Patient experienced L knee feeling "pretty good" upon end of treatment.   Rehab Potential Fair   Clinical Impairments Affecting Rehab Potential mulitiple knee surgeries   PT Frequency 2x / week   PT Duration 4 weeks   PT Treatment/Interventions Cryotherapy;Air traffic controller;Therapeutic exercise;Manual techniques;Patient/family education;Neuromuscular re-education;Passive range of motion;Dry needling;Taping   PT Next Visit Plan FOTO/GCODE; Continue L knee strengthening. Assess Dry needling . Modalities and STW PRN per MPT POC   PT Home Exercise Plan active knee flex/ext   Consulted and Agree with Plan of Care Patient      Patient will benefit from skilled therapeutic intervention in order to improve the following deficits and impairments:  Pain, Decreased activity tolerance, Decreased strength, Decreased range of motion, Abnormal gait  Visit Diagnosis: Muscle weakness (generalized)  Pain in left knee     Problem List Patient Active Problem List   Diagnosis Date Noted  . Diabetes type 2, controlled ([place]) 03/07/2016  . Gout   . Essential hypertension   . Enuresis 11/19/2015  . HLD (hyperlipidemia) 01/04/2014  . HTN  (hypertension) 01/04/2014  . Anemia, iron deficiency 12/30/2011  . Constipation 04/04/2009  . TUBULOVILLOUS ADENOMA, COLON 04/03/2009    Wynelle Fanny, PTA 04/10/2016, 3:57 PM  [facility] [place], [place], [address] Phone: [phone]   Fax:  [phone]  Name: Beth [name] MRN: [id number] Date of Birth: August 19, 1946

## 2016-04-15 ENCOUNTER — Encounter: Payer: Self-pay | Admitting: Physical Therapy

## 2016-04-15 ENCOUNTER — Ambulatory Visit: Payer: Medicare Other | Admitting: Physical Therapy

## 2016-04-15 DIAGNOSIS — M6281 Muscle weakness (generalized): Secondary | ICD-10-CM | POA: Diagnosis not present

## 2016-04-15 DIAGNOSIS — M25561 Pain in right knee: Secondary | ICD-10-CM | POA: Diagnosis not present

## 2016-04-15 DIAGNOSIS — M25562 Pain in left knee: Secondary | ICD-10-CM

## 2016-04-15 NOTE — Therapy (Signed)
[facility] [place], [place], [address] Phone: [phone]   Fax:  [phone]  Physical Therapy Treatment  Patient Details  Name: Beth [name] MRN: [id number] Date of Birth: May 16, 1946 Referring Provider: [name] MD  Encounter Date: 04/15/2016      PT End of Session - 04/15/16 1433    Visit Number 11   Number of Visits 16   Date for PT Re-Evaluation 05/01/16   PT Start Time 1430   PT Stop Time 1520   PT Time Calculation (min) 50 min   Activity Tolerance Patient tolerated treatment well   Behavior During Therapy [facility] for tasks assessed/performed      Past Medical History  Diagnosis Date  . Hypertension   . CVA (cerebral infarction)     2006  . Hyperlipidemia   . Blood transfusion without reported diagnosis 2012    anemia;pt denies transfusion stated was only on iron tablet  . Heart murmur   . Nocturia     3-4 times per night  . Gout     left elbow  . Arthritis     Knee both knees  . Anemia   . 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 08-09-14 Right eye  . Family history of anesthesia complication     sister very slow to awaken after anesthesia;severe vomiting   . Stroke ([place]) 2006    x 1 no deficits noted   . Cataract     left  . CKD (chronic kidney disease), stage III     Past Surgical History  Procedure Laterality Date  . Carpal tunnel release Right 1983  . Abdominal hysterectomy  1983  . Colonscopy  June 21, 2014  . Nasal cauterization  2012  . Total knee arthroplasty Right 07/03/2014    Procedure: RIGHT TOTAL KNEE ARTHROPLASTY;  Surgeon: Gearlean Alf, MD;  [facility];  Service: Orthopedics;  Laterality: Right;  . Patellar tendon repair Right 08/11/2014    Procedure: RIGHT PATELLA TENDON REPAIR;  Surgeon: Gearlean Alf, MD;  [facility];  Service: Orthopedics;  Laterality: Right;  . Joint replacement  06/2014    right knee  . I&d knee with poly exchange Right 10/02/2014     Procedure: IRRIGATION AND DEBRIDEMENT RIGHT KNEE WITH POLY EXCHANGE;  Surgeon: Gearlean Alf, MD;  [facility];  Service: Orthopedics;  Laterality: Right;  . Excisional total knee arthroplasty with antibiotic spacers Right 02/16/2015    Procedure: RIGHT KNEE RESECTION ARTHROPLASTY WITH ANTIBIOTIC SPACERS;  Surgeon: [name], MD;  [facility];  Service: Orthopedics;  Laterality: Right;  . Reimplantation of total knee Right 05/23/2015    Procedure: RIGHT KNEE ARTHROPLASTY REIMPLANTATION;  Surgeon: [name], MD;  [facility];  Service: Orthopedics;  Laterality: Right;    There were no vitals filed for this visit.      Subjective Assessment - 04/15/16 1432    Subjective Reports that she began having increased knee pain last night and attributes it to rainy, damp weather that began last night. Reports that her granddaughter is supposed to come to her house soon to see if she can walk with cane. Patient states that she does that do anything like that without someone being at her home due to fear of falling.   Patient Stated Goals to be able to walk with a quad cane   Currently in Pain? Yes   Pain Score 5    Pain Location Knee  Pain Orientation Left   Pain Descriptors / Indicators Aching;Sore   Pain Type Chronic pain   Pain Onset More than a month ago            [facility] PT Assessment - 04/15/16 0001    Assessment   Medical Diagnosis chronic Left knee pain   Onset Date/Surgical Date 02/12/15   Next MD Visit 05/2016   Precautions   Precautions Fall   Precaution Comments FALL RISK                     [facility] Adult PT Treatment/Exercise - 04/15/16 0001    Knee/Hip Exercises: Aerobic   Nustep L5 x15 min, monitored for progression   Knee/Hip Exercises: Seated   Long Arc Quad Strengthening;Left;3 sets;10 reps   Long Arc Quad Weight 3 lbs.   Ball Squeeze x20 reps 10 sec hold   Knee/Hip Exercises: Supine   Straight Leg Raises Strengthening;Left;2  sets;10 reps   Knee/Hip Exercises: Sidelying   Hip ABduction Strengthening;Left;2 sets;10 reps   Modalities   Modalities Electrical Stimulation   Electrical Stimulation   Electrical Stimulation Location L knee   Electrical Stimulation Action Pre-Mod   Electrical Stimulation Parameters 80-150 Hz x15 min   Electrical Stimulation Goals Pain                     PT Long Term Goals - 04/15/16 1507    PT LONG TERM GOAL #1   Title Improved L knee flexion to allow foot clearance in swing phase of gait.   Time 4   Period Weeks   Status On-going   PT LONG TERM GOAL #2   Title decreased L knee pain to 2-3/10 or less with activity.   Time 4   Period Weeks   Status Achieved               Plan - 04/15/16 1507    Clinical Impression Statement Patient tolerated today's treatment fairly well as she arrived at PT with increased L knee soreness to which she attributes to the recent rainy, damp weather. Patient noted that she could not have end range LAQ secondary to inferiomedial L knee discomfort. Patient has [place] assist with seated ball squeeze as she couldn't squeeze ball hard due to knee discomfort. L extensor lag continues to be present with supine L SLR. Electrical stimulation was completed to L knee for pain control today with normal response upon end of session. Patient able to achieve LT goal regarding knee pain with ADLs. Patient continues to lack full L foot clearance with ambulation at this time with FWW.   Rehab Potential Fair   Clinical Impairments Affecting Rehab Potential mulitiple knee surgeries   PT Frequency 2x / week   PT Duration 4 weeks   PT Treatment/Interventions Cryotherapy;Air traffic controller;Therapeutic exercise;Manual techniques;Patient/family education;Neuromuscular re-education;Passive range of motion;Dry needling;Taping   PT Next Visit Plan Continue L knee strengthening with modalites PRN for pain per MPT POC.   PT  Home Exercise Plan active knee flex/ext   Consulted and Agree with Plan of Care Patient      Patient will benefit from skilled therapeutic intervention in order to improve the following deficits and impairments:  Pain, Decreased activity tolerance, Decreased strength, Decreased range of motion, Abnormal gait  Visit Diagnosis: Muscle weakness (generalized)  Pain in left knee     Problem List Patient Active Problem List   Diagnosis Date Noted  . Diabetes type 2, controlled ([place])  03/07/2016  . Gout   . Essential hypertension   . Enuresis 11/19/2015  . HLD (hyperlipidemia) 01/04/2014  . HTN (hypertension) 01/04/2014  . Anemia, iron deficiency 12/30/2011  . Constipation 04/04/2009  . TUBULOVILLOUS ADENOMA, COLON 04/03/2009    Wynelle Fanny, PTA 04/15/2016, 3:23 PM  [facility] [address] [place], [place], [address] Phone: [phone]   Fax:  [phone]  Name: Beth [name] MRN: [id number] Date of Birth: 31-Dec-1945

## 2016-04-17 ENCOUNTER — Ambulatory Visit: Payer: Medicare Other | Admitting: Physical Therapy

## 2016-04-17 DIAGNOSIS — M6281 Muscle weakness (generalized): Secondary | ICD-10-CM | POA: Diagnosis not present

## 2016-04-17 DIAGNOSIS — M25562 Pain in left knee: Secondary | ICD-10-CM | POA: Diagnosis not present

## 2016-04-17 DIAGNOSIS — M25561 Pain in right knee: Secondary | ICD-10-CM | POA: Diagnosis not present

## 2016-04-17 NOTE — Therapy (Signed)
[facility] [place], [place], [address] Phone: [phone]   Fax:  [phone]  Physical Therapy Treatment  Patient Details  Name: Beth [name] MRN: [id number] Date of Birth: [birth year] Referring Provider: [name] MD  Encounter Date: 04/17/2016      PT End of Session - 04/17/16 1850    Visit Number 12   Number of Visits 16   Date for PT Re-Evaluation 05/01/16   PT Start Time 0238   PT Stop Time 0316   PT Time Calculation (min) 38 min      Past Medical History  Diagnosis Date  . Hypertension   . CVA (cerebral infarction)     2006  . Hyperlipidemia   . Blood transfusion without reported diagnosis 2012    anemia;pt denies transfusion stated was only on iron tablet  . Heart murmur   . Nocturia     3-4 times per night  . Gout     left elbow  . Arthritis     Knee both knees  . Anemia   . 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 08-09-14 Right eye  . Family history of anesthesia complication     sister very slow to awaken after anesthesia;severe vomiting   . Stroke ([place]) 2006    x 1 no deficits noted   . Cataract     left  . CKD (chronic kidney disease), stage III     Past Surgical History  Procedure Laterality Date  . Carpal tunnel release Right 1983  . Abdominal hysterectomy  1983  . Colonscopy  June 21, 2014  . Nasal cauterization  2012  . Total knee arthroplasty Right 07/03/2014    Procedure: RIGHT TOTAL KNEE ARTHROPLASTY;  Surgeon: Gearlean Alf, MD;  [facility];  Service: Orthopedics;  Laterality: Right;  . Patellar tendon repair Right 08/11/2014    Procedure: RIGHT PATELLA TENDON REPAIR;  Surgeon: Gearlean Alf, MD;  [facility];  Service: Orthopedics;  Laterality: Right;  . Joint replacement  06/2014    right knee  . I&d knee with poly exchange Right 10/02/2014    Procedure: IRRIGATION AND DEBRIDEMENT RIGHT KNEE WITH POLY EXCHANGE;  Surgeon: Gearlean Alf, MD;  [facility];  Service: Orthopedics;  Laterality: Right;  . Excisional total knee arthroplasty with antibiotic spacers Right 02/16/2015    Procedure: RIGHT KNEE RESECTION ARTHROPLASTY WITH ANTIBIOTIC SPACERS;  Surgeon: [name], MD;  [facility];  Service: Orthopedics;  Laterality: Right;  . Reimplantation of total knee Right 05/23/2015    Procedure: RIGHT KNEE ARTHROPLASTY REIMPLANTATION;  Surgeon: [name], MD;  [facility];  Service: Orthopedics;  Laterality: Right;    There were no vitals filed for this visit.      Subjective Assessment - 04/17/16 1851    Subjective Pain woke me up last night.  CC right knee today previously evaluated per MD referral and reassessed today.   Patient Stated Goals to be able to walk with a quad cane   Pain Score 8    Pain Location Knee   Pain Orientation Right   Pain Descriptors / Indicators Aching;Sore   Pain Type Chronic pain   Pain Onset More than a month ago   Pain Frequency Constant  PT Long Term Goals - 04/15/16 1507    PT LONG TERM GOAL #1   Title Improved L knee flexion to allow foot clearance in swing phase of gait.   Time 4   Period Weeks   Status On-going   PT LONG TERM GOAL #2   Title decreased L knee pain to 2-3/10 or less with activity.   Time 4   Period Weeks   Status Achieved             Patient will benefit from skilled therapeutic intervention in order to improve the following deficits and impairments:     Visit Diagnosis: Right knee pain     Problem List Patient Active Problem List   Diagnosis Date Noted  . Diabetes type 2, controlled ([place]) 03/07/2016  . Gout   . Essential hypertension   . Enuresis 11/19/2015  . HLD (hyperlipidemia) 01/04/2014  . HTN (hypertension) 01/04/2014  . Anemia, iron deficiency 12/30/2011  . Constipation 04/04/2009  . TUBULOVILLOUS ADENOMA, COLON 04/03/2009  Patient presented to clinic today with  c/o right knee pain that woke her last night.  Continued limited right knee extension as previously assessed.  Her right Pes Anserine bursa was warm to touch and swollen and palpably very tender.  Treatment:  E'stim to right Pes Anserine bursa f/b STW/M and gentle IASTM x 8 minutes.  Patient tolerated treatment well.  Zaire Levesque, [place] MPT 04/17/2016, 7:01 PM  [facility] [address] [place], [place], [address] Phone: [phone]   Fax:  [phone]  Name: Beth [name] MRN: [id number] Date of Birth: 27-Nov-1946

## 2016-04-21 ENCOUNTER — Other Ambulatory Visit: Payer: Self-pay | Admitting: Family Medicine

## 2016-04-21 MED ORDER — NEBIVOLOL HCL 20 MG PO TABS: 1.0000 | ORAL_TABLET | Freq: Every evening | ORAL | Status: DC

## 2016-04-21 MED ORDER — AMLODIPINE BESYLATE 10 MG PO TABS: 10.0000 mg | ORAL_TABLET | Freq: Every evening | ORAL | Status: DC

## 2016-04-21 MED ORDER — ATORVASTATIN CALCIUM 20 MG PO TABS: 20.0000 mg | ORAL_TABLET | Freq: Every evening | ORAL | Status: DC

## 2016-04-21 MED ORDER — OLMESARTAN MEDOXOMIL 40 MG PO TABS: 40.0000 mg | ORAL_TABLET | Freq: Every day | ORAL | Status: DC

## 2016-04-21 NOTE — Telephone Encounter (Signed)
done

## 2016-04-22 ENCOUNTER — Encounter: Payer: Self-pay | Admitting: Physical Therapy

## 2016-04-22 ENCOUNTER — Ambulatory Visit: Payer: Medicare Other | Admitting: Physical Therapy

## 2016-04-22 DIAGNOSIS — M6281 Muscle weakness (generalized): Secondary | ICD-10-CM | POA: Diagnosis not present

## 2016-04-22 DIAGNOSIS — M25561 Pain in right knee: Secondary | ICD-10-CM | POA: Diagnosis not present

## 2016-04-22 DIAGNOSIS — M25562 Pain in left knee: Secondary | ICD-10-CM

## 2016-04-22 NOTE — Therapy (Signed)
[facility] [place], [place], [address] Phone: [phone]   Fax:  [phone]  Physical Therapy Treatment  Patient Details  Name: Beth [name] MRN: [id number] Date of Birth: [birth year] Referring Provider: [name] MD  Encounter Date: 04/22/2016      PT End of Session - 04/22/16 1448    Visit Number 13   Number of Visits 16   Date for PT Re-Evaluation 05/01/16   PT Start Time [id number]   PT Stop Time 1515   PT Time Calculation (min) 43 min   Activity Tolerance Patient tolerated treatment well;Patient limited by pain   Behavior During Therapy [facility] for tasks assessed/performed      Past Medical History  Diagnosis Date  . Hypertension   . CVA (cerebral infarction)     2006  . Hyperlipidemia   . Blood transfusion without reported diagnosis 2012    anemia;pt denies transfusion stated was only on iron tablet  . Heart murmur   . Nocturia     3-4 times per night  . Gout     left elbow  . Arthritis     Knee both knees  . Anemia   . 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 08-09-14 Right eye  . Family history of anesthesia complication     sister very slow to awaken after anesthesia;severe vomiting   . Stroke ([place]) 2006    x 1 no deficits noted   . Cataract     left  . CKD (chronic kidney disease), stage III     Past Surgical History  Procedure Laterality Date  . Carpal tunnel release Right 1983  . Abdominal hysterectomy  1983  . Colonscopy  June 21, 2014  . Nasal cauterization  2012  . Total knee arthroplasty Right 07/03/2014    Procedure: RIGHT TOTAL KNEE ARTHROPLASTY;  Surgeon: Gearlean Alf, MD;  [facility];  Service: Orthopedics;  Laterality: Right;  . Patellar tendon repair Right 08/11/2014    Procedure: RIGHT PATELLA TENDON REPAIR;  Surgeon: Gearlean Alf, MD;  [facility];  Service: Orthopedics;  Laterality: Right;  . Joint replacement  06/2014    right knee  . I&d knee with poly  exchange Right 10/02/2014    Procedure: IRRIGATION AND DEBRIDEMENT RIGHT KNEE WITH POLY EXCHANGE;  Surgeon: Gearlean Alf, MD;  [facility];  Service: Orthopedics;  Laterality: Right;  . Excisional total knee arthroplasty with antibiotic spacers Right 02/16/2015    Procedure: RIGHT KNEE RESECTION ARTHROPLASTY WITH ANTIBIOTIC SPACERS;  Surgeon: [name], MD;  [facility];  Service: Orthopedics;  Laterality: Right;  . Reimplantation of total knee Right 05/23/2015    Procedure: RIGHT KNEE ARTHROPLASTY REIMPLANTATION;  Surgeon: [name], MD;  [facility];  Service: Orthopedics;  Laterality: Right;    There were no vitals filed for this visit.      Subjective Assessment - 04/22/16 1447    Subjective Reports that she feels like she is walking better in regards to L knee but R knee is now very sore medially and is swelling from medial knee to ankle region.   Patient Stated Goals to be able to walk with a quad cane   Currently in Pain? Yes   Pain Score 6    Pain Location Knee   Pain Orientation Right   Pain Descriptors / Indicators Aching;Sore   Pain Type Chronic pain   Pain Onset More than a month ago  [facility] PT Assessment - 04/22/16 0001    Assessment   Medical Diagnosis chronic Left knee pain   Onset Date/Surgical Date 02/12/15   Next MD Visit 05/2016   Precautions   Precautions Fall   Precaution Comments FALL RISK                     [facility] Adult PT Treatment/Exercise - 04/22/16 0001    Knee/Hip Exercises: Aerobic   Nustep L5 x15 min, monitored for progression   Knee/Hip Exercises: Seated   Long Arc Quad Strengthening;Left;3 sets;10 reps   Long Arc Quad Weight 4 lbs.   Ball Squeeze x30 reps 5 sec   Marching Limitations LLE x30 reps 4#   Knee/Hip Exercises: Supine   Terminal Knee Extension Strengthening;Left;3 sets;10 reps;Other (comment)  3 sec hold   Straight Leg Raises Strengthening;Left;3 sets;10 reps   Knee/Hip Exercises:  Sidelying   Hip ABduction Strengthening;Left;3 sets;10 reps                     PT Long Term Goals - 04/15/16 1507    PT LONG TERM GOAL #1   Title Improved L knee flexion to allow foot clearance in swing phase of gait.   Time 4   Period Weeks   Status On-going   PT LONG TERM GOAL #2   Title decreased L knee pain to 2-3/10 or less with activity.   Time 4   Period Weeks   Status Achieved               Plan - 04/22/16 1526    Clinical Impression Statement Patient tolerated today's treatment fairly well as she was limited secondary to a reoccurance of R knee pain and inflammation. Upon palpation of R knee patient was sore to palpation in the anteriomedial aspect of R knee and patient reported inflammation from knee down to ankle. Patient's incision was also noted as appearing to have inflammation in mid incision. Patient tolerated L knee strengthening exercises well with no reports of L knee pain today. Patient was slightly limited wiht L hip abduction in sidelying due to position.   Rehab Potential Fair   Clinical Impairments Affecting Rehab Potential mulitiple knee surgeries   PT Frequency 2x / week   PT Duration 4 weeks   PT Treatment/Interventions Cryotherapy;Air traffic controller;Therapeutic exercise;Manual techniques;Patient/family education;Neuromuscular re-education;Passive range of motion;Dry needling;Taping   PT Next Visit Plan Continue L knee strengthening with modalites PRN for pain per MPT POC.   PT Home Exercise Plan active knee flex/ext   Consulted and Agree with Plan of Care Patient      Patient will benefit from skilled therapeutic intervention in order to improve the following deficits and impairments:  Pain, Decreased activity tolerance, Decreased strength, Decreased range of motion, Abnormal gait  Visit Diagnosis: Muscle weakness (generalized)  Pain in left knee     Problem List Patient Active Problem  List   Diagnosis Date Noted  . Diabetes type 2, controlled ([place]) 03/07/2016  . Gout   . Essential hypertension   . Enuresis 11/19/2015  . HLD (hyperlipidemia) 01/04/2014  . HTN (hypertension) 01/04/2014  . Anemia, iron deficiency 12/30/2011  . Constipation 04/04/2009  . TUBULOVILLOUS ADENOMA, COLON 04/03/2009    Beth [name], PTA 04/22/2016 3:32 PM  [facility] [address] [place], [place], [address] Phone: [phone]   Fax:  [phone]  Name: Beth [name] MRN: [id number] Date of Birth: 21-Oct-1946

## 2016-04-24 ENCOUNTER — Ambulatory Visit: Payer: Medicare Other | Admitting: Physical Therapy

## 2016-04-24 ENCOUNTER — Encounter: Payer: Self-pay | Admitting: Physical Therapy

## 2016-04-24 DIAGNOSIS — M25561 Pain in right knee: Secondary | ICD-10-CM | POA: Diagnosis not present

## 2016-04-24 DIAGNOSIS — M25562 Pain in left knee: Secondary | ICD-10-CM

## 2016-04-24 DIAGNOSIS — M6281 Muscle weakness (generalized): Secondary | ICD-10-CM | POA: Diagnosis not present

## 2016-04-24 NOTE — Therapy (Signed)
[facility] [place], [place], [address] Phone: [phone]   Fax:  [phone]  Physical Therapy Treatment  Patient Details  Name: Beth [name] MRN: [id number] Date of Birth: Oct 11, 1946 Referring Provider: [name] MD  Encounter Date: 04/24/2016      PT End of Session - 04/24/16 1447    Visit Number 14   Number of Visits 16   Date for PT Re-Evaluation 05/01/16   PT Start Time [id number]   PT Stop Time 1521   PT Time Calculation (min) 45 min   Activity Tolerance Patient tolerated treatment well;Patient limited by pain   Behavior During Therapy [facility] for tasks assessed/performed      Past Medical History  Diagnosis Date  . Hypertension   . CVA (cerebral infarction)     2006  . Hyperlipidemia   . Blood transfusion without reported diagnosis 2012    anemia;pt denies transfusion stated was only on iron tablet  . Heart murmur   . Nocturia     3-4 times per night  . Gout     left elbow  . Arthritis     Knee both knees  . Anemia   . 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 08-09-14 Right eye  . Family history of anesthesia complication     sister very slow to awaken after anesthesia;severe vomiting   . Stroke ([place]) 2006    x 1 no deficits noted   . Cataract     left  . CKD (chronic kidney disease), stage III     Past Surgical History  Procedure Laterality Date  . Carpal tunnel release Right 1983  . Abdominal hysterectomy  1983  . Colonscopy  June 21, 2014  . Nasal cauterization  2012  . Total knee arthroplasty Right 07/03/2014    Procedure: RIGHT TOTAL KNEE ARTHROPLASTY;  Surgeon: Gearlean Alf, MD;  [facility]: [facility];  Service: Orthopedics;  Laterality: Right;  . Patellar tendon repair Right 08/11/2014    Procedure: RIGHT PATELLA TENDON REPAIR;  Surgeon: Gearlean Alf, MD;  [facility];  Service: Orthopedics;  Laterality: Right;  . Joint replacement  06/2014    right knee  . I&d knee with poly  exchange Right 10/02/2014    Procedure: IRRIGATION AND DEBRIDEMENT RIGHT KNEE WITH POLY EXCHANGE;  Surgeon: Gearlean Alf, MD;  [facility];  Service: Orthopedics;  Laterality: Right;  . Excisional total knee arthroplasty with antibiotic spacers Right 02/16/2015    Procedure: RIGHT KNEE RESECTION ARTHROPLASTY WITH ANTIBIOTIC SPACERS;  Surgeon: [name], MD;  [facility];  Service: Orthopedics;  Laterality: Right;  . Reimplantation of total knee Right 05/23/2015    Procedure: RIGHT KNEE ARTHROPLASTY REIMPLANTATION;  Surgeon: [name], MD;  [facility];  Service: Orthopedics;  Laterality: Right;    There were no vitals filed for this visit.      Subjective Assessment - 04/24/16 1446    Subjective Reports that she cannot see Dr. Maureen Ralphs for R knee pain and swelling until next Tuesday. States that she has been able to get inflammation to decrease some.   Patient Stated Goals to be able to walk with a quad cane            [facility] PT Assessment - 04/24/16 0001    Assessment   Medical Diagnosis chronic Left knee pain   Onset Date/Surgical Date 02/12/15   Next MD Visit 05/2016   Precautions   Precautions Fall  Precaution Comments FALL RISK                     [facility] Adult PT Treatment/Exercise - 04/24/16 0001    Knee/Hip Exercises: Aerobic   Nustep L6 x15 min, monitored for progression   Knee/Hip Exercises: Seated   Long Arc Quad Strengthening;Left;3 sets;10 reps   Long Arc Quad Weight 4 lbs.   Marching Limitations LLE x30 reps 4#   Knee/Hip Exercises: Supine   Straight Leg Raises Strengthening;Left;3 sets;10 reps   Knee/Hip Exercises: Sidelying   Hip ABduction Strengthening;Left;2 sets;10 reps  lateral L knee pain reported   Modalities   Modalities Electrical Stimulation   Electrical Stimulation   Electrical Stimulation Location L knee   Electrical Stimulation Action Pre-Mod   Electrical Stimulation Parameters 80-150 Hz x15 min   Electrical  Stimulation Goals Pain                     PT Long Term Goals - 04/15/16 1507    PT LONG TERM GOAL #1   Title Improved L knee flexion to allow foot clearance in swing phase of gait.   Time 4   Period Weeks   Status On-going   PT LONG TERM GOAL #2   Title decreased L knee pain to 2-3/10 or less with activity.   Time 4   Period Weeks   Status Achieved               Plan - 04/24/16 1507    Clinical Impression Statement Patient arrived to treatment today with continued increased R knee pain and inflammation but no reports of L knee pain. Patient able to complete exercises seating and supine with with no reports of pain only fatigue. Upon transitiioning to sidelying for L hip abduction patient reported L lateral knee pain with facial grimacing observed. Patient was able to complete 2x10 reps but upon initiating for third set patient demonstrated facial grimacing again. L ITB was palpated with tightness noted and pain was indicated as being distal attachment for ITB. Normal modality response noted following removal of the modality.   Rehab Potential Fair   Clinical Impairments Affecting Rehab Potential mulitiple knee surgeries   PT Frequency 2x / week   PT Duration 4 weeks   PT Treatment/Interventions Cryotherapy;Air traffic controller;Therapeutic exercise;Manual techniques;Patient/family education;Neuromuscular re-education;Passive range of motion;Dry needling;Taping   PT Next Visit Plan Continue L knee strengthening with modalites PRN for pain per MPT POC.   PT Home Exercise Plan active knee flex/ext   Consulted and Agree with Plan of Care Patient      Patient will benefit from skilled therapeutic intervention in order to improve the following deficits and impairments:  Pain, Decreased activity tolerance, Decreased strength, Decreased range of motion, Abnormal gait  Visit Diagnosis: Muscle weakness (generalized)  Pain in left  knee     Problem List Patient Active Problem List   Diagnosis Date Noted  . Diabetes type 2, controlled ([place]) 03/07/2016  . Gout   . Essential hypertension   . Enuresis 11/19/2015  . HLD (hyperlipidemia) 01/04/2014  . HTN (hypertension) 01/04/2014  . Anemia, iron deficiency 12/30/2011  . Constipation 04/04/2009  . TUBULOVILLOUS ADENOMA, COLON 04/03/2009    Wynelle Fanny, PTA 04/24/2016, 3:25 PM  [facility] [address] [place], [place], [address] Phone: [phone]   Fax:  [phone]  Name: Beth [name] MRN: [id number] Date of Birth: 1946/11/01

## 2016-04-25 DIAGNOSIS — G5632 Lesion of radial nerve, left upper limb: Secondary | ICD-10-CM | POA: Diagnosis not present

## 2016-04-29 ENCOUNTER — Encounter: Payer: Medicare Other | Admitting: Physical Therapy

## 2016-04-29 DIAGNOSIS — M1712 Unilateral primary osteoarthritis, left knee: Secondary | ICD-10-CM | POA: Diagnosis not present

## 2016-05-01 ENCOUNTER — Ambulatory Visit: Payer: Medicare Other | Admitting: Physical Therapy

## 2016-05-01 DIAGNOSIS — M25562 Pain in left knee: Secondary | ICD-10-CM

## 2016-05-01 DIAGNOSIS — M25561 Pain in right knee: Secondary | ICD-10-CM | POA: Diagnosis not present

## 2016-05-01 DIAGNOSIS — M6281 Muscle weakness (generalized): Secondary | ICD-10-CM | POA: Diagnosis not present

## 2016-05-01 NOTE — Therapy (Signed)
[facility] [place], [place], [address] Phone: [phone]   Fax:  [phone]  Physical Therapy Treatment  Patient Details  Name: Beth [name] MRN: [id number] Date of Birth: [birth year] Referring Provider: [name] MD  Encounter Date: 05/01/2016      PT End of Session - 05/01/16 1454    Visit Number 15   Number of Visits 16   Date for PT Re-Evaluation 06/30/16   PT Start Time 0230   PT Stop Time 0322   PT Time Calculation (min) 52 min   Activity Tolerance Patient tolerated treatment well;Patient limited by pain   Behavior During Therapy [facility] for tasks assessed/performed      Past Medical History  Diagnosis Date  . Hypertension   . CVA (cerebral infarction)     2006  . Hyperlipidemia   . Blood transfusion without reported diagnosis 2012    anemia;pt denies transfusion stated was only on iron tablet  . Heart murmur   . Nocturia     3-4 times per night  . Gout     left elbow  . Arthritis     Knee both knees  . Anemia   . 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 08-09-14 Right eye  . Family history of anesthesia complication     sister very slow to awaken after anesthesia;severe vomiting   . Stroke ([place]) 2006    x 1 no deficits noted   . Cataract     left  . CKD (chronic kidney disease), stage III     Past Surgical History  Procedure Laterality Date  . Carpal tunnel release Right 1983  . Abdominal hysterectomy  1983  . Colonscopy  June 21, 2014  . Nasal cauterization  2012  . Total knee arthroplasty Right 07/03/2014    Procedure: RIGHT TOTAL KNEE ARTHROPLASTY;  Surgeon: Gearlean Alf, MD;  [facility];  Service: Orthopedics;  Laterality: Right;  . Patellar tendon repair Right 08/11/2014    Procedure: RIGHT PATELLA TENDON REPAIR;  Surgeon: Gearlean Alf, MD;  [facility];  Service: Orthopedics;  Laterality: Right;  . Joint replacement  06/2014    right knee  . I&d knee with poly  exchange Right 10/02/2014    Procedure: IRRIGATION AND DEBRIDEMENT RIGHT KNEE WITH POLY EXCHANGE;  Surgeon: Gearlean Alf, MD;  [facility];  Service: Orthopedics;  Laterality: Right;  . Excisional total knee arthroplasty with antibiotic spacers Right 02/16/2015    Procedure: RIGHT KNEE RESECTION ARTHROPLASTY WITH ANTIBIOTIC SPACERS;  Surgeon: [name], MD;  [facility];  Service: Orthopedics;  Laterality: Right;  . Reimplantation of total knee Right 05/23/2015    Procedure: RIGHT KNEE ARTHROPLASTY REIMPLANTATION;  Surgeon: [name], MD;  [facility];  Service: Orthopedics;  Laterality: Right;    There were no vitals filed for this visit.      Subjective Assessment - 05/01/16 1511    Subjective My left knee pain-level is a 6/10.   Patient Stated Goals to be able to walk with a quad cane   Pain Score 6    Pain Location Knee   Pain Orientation Left   Pain Descriptors / Indicators Aching;Sore   Pain Type Chronic pain   Pain Onset More than a month ago   Pain Frequency Constant                         [facility] Adult  PT Treatment/Exercise - 05/01/16 0001    Knee/Hip Exercises: Aerobic   Nustep Level 5 x 15 minutes.   Knee/Hip Exercises: Supine   Short Arc Quad Sets Limitations Left SAQ's with 2# x 5 minutes.   Acupuncturist Location --  Left knee.   Electrical Stimulation Action IFC   Electrical Stimulation Parameters 80-150 Hz x 15 minutes.   Manual Therapy   Passive ROM Gentle PROM to patient's left knee x 5 minutes into flexion and extension.                     PT Long Term Goals - 04/15/16 1507    PT LONG TERM GOAL #1   Title Improved L knee flexion to allow foot clearance in swing phase of gait.   Time 4   Period Weeks   Status On-going   PT LONG TERM GOAL #2   Title decreased L knee pain to 2-3/10 or less with activity.   Time 4   Period Weeks   Status Achieved                Plan - 05/01/16 1547    Clinical Impression Statement Patient reporting left knee pain at 6/10 today and continues to demonstrate range of motion deficits.   Clinical Impairments Affecting Rehab Potential mulitiple knee surgeries   PT Frequency 2x / week   PT Duration 8 weeks   PT Treatment/Interventions Cryotherapy;Air traffic controller;Therapeutic exercise;Manual techniques;Patient/family education;Neuromuscular re-education;Passive range of motion;Dry needling;Taping   PT Next Visit Plan Continue L knee strengthening with modalites PRN for pain per MPT POC.   PT Home Exercise Plan active knee flex/ext   Consulted and Agree with Plan of Care Patient      Patient will benefit from skilled therapeutic intervention in order to improve the following deficits and impairments:  Pain, Decreased activity tolerance, Decreased strength, Decreased range of motion, Abnormal gait  Visit Diagnosis: Muscle weakness (generalized) - Plan: PT plan of care cert/re-cert  Pain in left knee - Plan: PT plan of care cert/re-cert     Problem List Patient Active Problem List   Diagnosis Date Noted  . Diabetes type 2, controlled ([place]) 03/07/2016  . Gout   . Essential hypertension   . Enuresis 11/19/2015  . HLD (hyperlipidemia) 01/04/2014  . HTN (hypertension) 01/04/2014  . Anemia, iron deficiency 12/30/2011  . Constipation 04/04/2009  . TUBULOVILLOUS ADENOMA, COLON 04/03/2009    APP[place], [place] MPT 05/01/2016, 3:53 PM  [facility] [place], [place], [address] Phone: [phone]   Fax:  [phone]  Name: Beth [name] MRN: [id number] Date of Birth: [birth year]

## 2016-05-08 ENCOUNTER — Encounter: Payer: Medicare Other | Admitting: Physical Therapy

## 2016-05-13 ENCOUNTER — Ambulatory Visit: Payer: Medicare Other | Attending: Orthopedic Surgery | Admitting: Physical Therapy

## 2016-05-13 DIAGNOSIS — R2689 Other abnormalities of gait and mobility: Secondary | ICD-10-CM | POA: Diagnosis not present

## 2016-05-13 DIAGNOSIS — M25562 Pain in left knee: Secondary | ICD-10-CM | POA: Diagnosis not present

## 2016-05-13 DIAGNOSIS — M6281 Muscle weakness (generalized): Secondary | ICD-10-CM | POA: Diagnosis not present

## 2016-05-13 NOTE — Therapy (Signed)
[facility] [place], [place], [address] Phone: [phone]   Fax:  [phone]  Physical Therapy Treatment  Patient Details  Name: Beth [name] MRN: [id number] Date of Birth: March 04, 1946 Referring Provider: [name] MD  Encounter Date: 05/13/2016      PT End of Session - 05/13/16 1750    Visit Number 16   Number of Visits 16   Date for PT Re-Evaluation 06/30/16   PT Start Time 0145   PT Stop Time 0234   PT Time Calculation (min) 49 min   Activity Tolerance Patient tolerated treatment well;Patient limited by pain   Behavior During Therapy [facility] for tasks assessed/performed      Past Medical History  Diagnosis Date  . Hypertension   . CVA (cerebral infarction)     2006  . Hyperlipidemia   . Blood transfusion without reported diagnosis 2012    anemia;pt denies transfusion stated was only on iron tablet  . Heart murmur   . Nocturia     3-4 times per night  . Gout     left elbow  . Arthritis     Knee both knees  . Anemia   . 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 08-09-14 Right eye  . Family history of anesthesia complication     sister very slow to awaken after anesthesia;severe vomiting   . Stroke ([place]) 2006    x 1 no deficits noted   . Cataract     left  . CKD (chronic kidney disease), stage III     Past Surgical History  Procedure Laterality Date  . Carpal tunnel release Right 1983  . Abdominal hysterectomy  1983  . Colonscopy  June 21, 2014  . Nasal cauterization  2012  . Total knee arthroplasty Right 07/03/2014    Procedure: RIGHT TOTAL KNEE ARTHROPLASTY;  Surgeon: Gearlean Alf, MD;  [facility];  Service: Orthopedics;  Laterality: Right;  . Patellar tendon repair Right 08/11/2014    Procedure: RIGHT PATELLA TENDON REPAIR;  Surgeon: Gearlean Alf, MD;  [facility];  Service: Orthopedics;  Laterality: Right;  . Joint replacement  06/2014    right knee  . I&d knee with poly  exchange Right 10/02/2014    Procedure: IRRIGATION AND DEBRIDEMENT RIGHT KNEE WITH POLY EXCHANGE;  Surgeon: Gearlean Alf, MD;  [facility];  Service: Orthopedics;  Laterality: Right;  . Excisional total knee arthroplasty with antibiotic spacers Right 02/16/2015    Procedure: RIGHT KNEE RESECTION ARTHROPLASTY WITH ANTIBIOTIC SPACERS;  Surgeon: [name], MD;  [facility];  Service: Orthopedics;  Laterality: Right;  . Reimplantation of total knee Right 05/23/2015    Procedure: RIGHT KNEE ARTHROPLASTY REIMPLANTATION;  Surgeon: [name], MD;  [facility];  Service: Orthopedics;  Laterality: Right;    There were no vitals filed for this visit.      Subjective Assessment - 05/13/16 1751    Subjective I have trouble lifting my left knee up to go up a step.   Patient Stated Goals to be able to walk with a quad cane   Pain Score 6    Pain Location Knee   Pain Orientation Left   Pain Descriptors / Indicators Aching;Sore   Pain Type Chronic pain   Pain Onset More than a month ago  PT Long Term Goals - 04/15/16 1507    PT LONG TERM GOAL #1   Title Improved L knee flexion to allow foot clearance in swing phase of gait.   Time 4   Period Weeks   Status On-going   PT LONG TERM GOAL #2   Title decreased L knee pain to 2-3/10 or less with activity.   Time 4   Period Weeks   Status Achieved             Patient will benefit from skilled therapeutic intervention in order to improve the following deficits and impairments:     Visit Diagnosis: Muscle weakness (generalized)  Pain in left knee     Problem List Patient Active Problem List   Diagnosis Date Noted  . Diabetes type 2, controlled ([place]) 03/07/2016  . Gout   . Essential hypertension   . Enuresis 11/19/2015  . HLD (hyperlipidemia) 01/04/2014  . HTN (hypertension) 01/04/2014  . Anemia, iron deficiency 12/30/2011  . Constipation  04/04/2009  . TUBULOVILLOUS ADENOMA, COLON 04/03/2009  Treatment:  Nustep x 15 minutes f/b Left knee to chest stretching and active-assistive flexion in supine over 8 minutes.  IFC and CP to patient's left knee x 15 minutes.    Shamar Engelmann, [place] MPT 05/13/2016, 5:53 PM  [facility] [address] [place], [place], [address] Phone: [phone]   Fax:  [phone]  Name: Beth [name] MRN: [id number] Date of Birth: 1946-06-22

## 2016-05-15 ENCOUNTER — Ambulatory Visit: Payer: Medicare Other | Admitting: *Deleted

## 2016-05-15 DIAGNOSIS — M6281 Muscle weakness (generalized): Secondary | ICD-10-CM

## 2016-05-15 DIAGNOSIS — M25562 Pain in left knee: Secondary | ICD-10-CM

## 2016-05-15 DIAGNOSIS — R2689 Other abnormalities of gait and mobility: Secondary | ICD-10-CM | POA: Diagnosis not present

## 2016-05-15 NOTE — Therapy (Signed)
[facility] [place], [place], [address] Phone: [phone]   Fax:  [phone]  Physical Therapy Treatment  Patient Details  Name: Beth [name] MRN: [id number] Date of Birth: Feb 20, 1946 Referring Provider: [name] MD  Encounter Date: 05/15/2016      PT End of Session - 05/15/16 1522    Visit Number 17   Number of Visits 30   Date for PT Re-Evaluation 06/30/16   PT Start Time [id number]   PT Stop Time 1437   PT Time Calculation (min) 52 min   Activity Tolerance Patient tolerated treatment well;Patient limited by pain   Behavior During Therapy [facility] for tasks assessed/performed      Past Medical History  Diagnosis Date  . Hypertension   . CVA (cerebral infarction)     2006  . Hyperlipidemia   . Blood transfusion without reported diagnosis 2012    anemia;pt denies transfusion stated was only on iron tablet  . Heart murmur   . Nocturia     3-4 times per night  . Gout     left elbow  . Arthritis     Knee both knees  . Anemia   . 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 08-09-14 Right eye  . Family history of anesthesia complication     sister very slow to awaken after anesthesia;severe vomiting   . Stroke ([place]) 2006    x 1 no deficits noted   . Cataract     left  . CKD (chronic kidney disease), stage III     Past Surgical History  Procedure Laterality Date  . Carpal tunnel release Right 1983  . Abdominal hysterectomy  1983  . Colonscopy  June 21, 2014  . Nasal cauterization  2012  . Total knee arthroplasty Right 07/03/2014    Procedure: RIGHT TOTAL KNEE ARTHROPLASTY;  Surgeon: Gearlean Alf, MD;  [facility];  Service: Orthopedics;  Laterality: Right;  . Patellar tendon repair Right 08/11/2014    Procedure: RIGHT PATELLA TENDON REPAIR;  Surgeon: Gearlean Alf, MD;  [facility];  Service: Orthopedics;  Laterality: Right;  . Joint replacement  06/2014    right knee  . I&d knee with poly  exchange Right 10/02/2014    Procedure: IRRIGATION AND DEBRIDEMENT RIGHT KNEE WITH POLY EXCHANGE;  Surgeon: Gearlean Alf, MD;  [facility];  Service: Orthopedics;  Laterality: Right;  . Excisional total knee arthroplasty with antibiotic spacers Right 02/16/2015    Procedure: RIGHT KNEE RESECTION ARTHROPLASTY WITH ANTIBIOTIC SPACERS;  Surgeon: [name], MD;  [facility];  Service: Orthopedics;  Laterality: Right;  . Reimplantation of total knee Right 05/23/2015    Procedure: RIGHT KNEE ARTHROPLASTY REIMPLANTATION;  Surgeon: [name], MD;  [facility];  Service: Orthopedics;  Laterality: Right;    There were no vitals filed for this visit.      Subjective Assessment - 05/15/16 1405    Subjective I have trouble lifting my left knee up to go up a step.   Patient Stated Goals to be able to walk with a quad cane   Currently in Pain? Yes   Pain Score 5    Pain Location Knee   Pain Orientation Left   Pain Descriptors / Indicators Aching;Sore   Pain Type Chronic pain   Pain Onset More than a month ago   Pain Frequency Constant  [facility] Adult PT Treatment/Exercise - 05/15/16 0001    Knee/Hip Exercises: Aerobic   Nustep L5 x15 min, monitored for progression   Modalities   Modalities Electrical Stimulation;Cryotherapy   Cryotherapy   Number Minutes Cryotherapy 15 Minutes   Cryotherapy Location Knee   Type of Cryotherapy Ice pack   Electrical Stimulation   Electrical Stimulation Location --  Left knee.   Electrical Stimulation Action IFC   Electrical Stimulation Parameters 80-150 hz x 15 mins   Electrical Stimulation Goals Pain   Manual Therapy   Soft tissue mobilization to L lateral thigh, quads and peroneals   Myofascial Release to L ITB   Passive ROM Gentle PROM and AAROM  to patient's left knee and hip  into flexion and thenknee  extension.                     PT Long Term Goals - 04/15/16 1507    PT  LONG TERM GOAL #1   Title Improved L knee flexion to allow foot clearance in swing phase of gait.   Time 4   Period Weeks   Status On-going   PT LONG TERM GOAL #2   Title decreased L knee pain to 2-3/10 or less with activity.   Time 4   Period Weeks   Status Achieved               Plan - 05/15/16 1523    Clinical Impression Statement Pt did fairly well with RX today and was able to complete therex and manual therapy with minimal pain increase Her chief complaint is still weakness in picking LT LE up when walking and pain in posteriolateral aspect of LT knee.  SHE was able to perform supine AROM and AAROM exs for hip and knee flexion and is progressing . Goals are ongoing   Rehab Potential Fair   Clinical Impairments Affecting Rehab Potential mulitiple knee surgeries   PT Frequency 2x / week   PT Duration 8 weeks   PT Treatment/Interventions Cryotherapy;Air traffic controller;Therapeutic exercise;Manual techniques;Patient/family education;Neuromuscular re-education;Passive range of motion;Dry needling;Taping   PT Next Visit Plan Continue L knee strengthening with modalites PRN for pain per MPT POC.   PT Home Exercise Plan active knee flex/ext   Consulted and Agree with Plan of Care Patient      Patient will benefit from skilled therapeutic intervention in order to improve the following deficits and impairments:  Pain, Decreased activity tolerance, Decreased strength, Decreased range of motion, Abnormal gait  Visit Diagnosis: Muscle weakness (generalized)  Pain in left knee     Problem List Patient Active Problem List   Diagnosis Date Noted  . Diabetes type 2, controlled ([place]) 03/07/2016  . Gout   . Essential hypertension   . Enuresis 11/19/2015  . HLD (hyperlipidemia) 01/04/2014  . HTN (hypertension) 01/04/2014  . Anemia, iron deficiency 12/30/2011  . Constipation 04/04/2009  . TUBULOVILLOUS ADENOMA, COLON 04/03/2009     Harvey Matlack,CHRIS, PTA 05/15/2016, 3:31 PM  [facility] [place], [place], [address] Phone: [phone]   Fax:  [phone]  Name: Beth [name] MRN: [id number] Date of Birth: 09-Feb-1946

## 2016-05-20 ENCOUNTER — Ambulatory Visit: Payer: Medicare Other | Admitting: Physical Therapy

## 2016-05-20 ENCOUNTER — Encounter: Payer: Self-pay | Admitting: Physical Therapy

## 2016-05-20 DIAGNOSIS — R2689 Other abnormalities of gait and mobility: Secondary | ICD-10-CM

## 2016-05-20 DIAGNOSIS — M25562 Pain in left knee: Secondary | ICD-10-CM

## 2016-05-20 DIAGNOSIS — M6281 Muscle weakness (generalized): Secondary | ICD-10-CM | POA: Diagnosis not present

## 2016-05-20 NOTE — Therapy (Signed)
[facility] [place], [place], [address] Phone: [phone]   Fax:  [phone]  Physical Therapy Treatment  Patient Details  Name: Beth [name] MRN: [id number] Date of Birth: 04-18-1946 Referring Provider: [name] MD  Encounter Date: 05/20/2016      PT End of Session - 05/20/16 1512    Visit Number 18   Number of Visits 30   Date for PT Re-Evaluation 06/30/16   PT Start Time [id number]   PT Stop Time 1604   PT Time Calculation (min) 47 min   Activity Tolerance Patient tolerated treatment well;Patient limited by pain   Behavior During Therapy [facility] for tasks assessed/performed      Past Medical History  Diagnosis Date  . Hypertension   . CVA (cerebral infarction)     2006  . Hyperlipidemia   . Blood transfusion without reported diagnosis 2012    anemia;pt denies transfusion stated was only on iron tablet  . Heart murmur   . Nocturia     3-4 times per night  . Gout     left elbow  . Arthritis     Knee both knees  . Anemia   . 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 08-09-14 Right eye  . Family history of anesthesia complication     sister very slow to awaken after anesthesia;severe vomiting   . Stroke ([place]) 2006    x 1 no deficits noted   . Cataract     left  . CKD (chronic kidney disease), stage III     Past Surgical History  Procedure Laterality Date  . Carpal tunnel release Right 1983  . Abdominal hysterectomy  1983  . Colonscopy  June 21, 2014  . Nasal cauterization  2012  . Total knee arthroplasty Right 07/03/2014    Procedure: RIGHT TOTAL KNEE ARTHROPLASTY;  Surgeon: Gearlean Alf, MD;  Location: [facility];  Service: Orthopedics;  Laterality: Right;  . Patellar tendon repair Right 08/11/2014    Procedure: RIGHT PATELLA TENDON REPAIR;  Surgeon: Gearlean Alf, MD;  Location: [facility];  Service: Orthopedics;  Laterality: Right;  . Joint replacement  06/2014    right knee  . I&d knee with poly  exchange Right 10/02/2014    Procedure: IRRIGATION AND DEBRIDEMENT RIGHT KNEE WITH POLY EXCHANGE;  Surgeon: Gearlean Alf, MD;  Location: [facility];  Service: Orthopedics;  Laterality: Right;  . Excisional total knee arthroplasty with antibiotic spacers Right 02/16/2015    Procedure: RIGHT KNEE RESECTION ARTHROPLASTY WITH ANTIBIOTIC SPACERS;  Surgeon: [name], MD;  [facility]: [facility];  Service: Orthopedics;  Laterality: Right;  . Reimplantation of total knee Right 05/23/2015    Procedure: RIGHT KNEE ARTHROPLASTY REIMPLANTATION;  Surgeon: [name], MD;  [facility]: [facility];  Service: Orthopedics;  Laterality: Right;    There were no vitals filed for this visit.      Subjective Assessment - 05/20/16 1512    Subjective Reports that she has 6/10 R knee pain and 3/10 L knee pain. Reports that she still has an inflammed area where MPT had stated he thought it may be an inflammed bursa.   Patient Stated Goals to be able to walk with a quad cane   Currently in Pain? Yes   Pain Score 3    Pain Location Knee   Pain Orientation Left   Pain Type Chronic pain   Pain Onset More than a month ago  [facility] PT Assessment - 05/20/16 0001    Assessment   Medical Diagnosis chronic Left knee pain   Onset Date/Surgical Date 02/12/15   Next MD Visit 05/22/2016   Precautions   Precautions Fall   Precaution Comments FALL RISK   ROM / Strength   AROM / PROM / Strength AROM   AROM   Overall AROM  Deficits   AROM Assessment Site Knee   Right/Left Knee Left   Left Knee Extension -10   Left Knee Flexion 83                     [facility] Adult PT Treatment/Exercise - 05/20/16 0001    Knee/Hip Exercises: Aerobic   Nustep L6 x15 min, monitored for progression   Knee/Hip Exercises: Standing   Forward Step Up Left;5 reps;Hand Hold: 2;Step Height: 4"  Pain with increased WB in LLE    Modalities   Modalities Education officer, environmental Stimulation  Location L knee   Electrical Stimulation Action IFC   Electrical Stimulation Parameters 1-10 hz x15 min   Electrical Stimulation Goals Pain   Manual Therapy   Manual Therapy Soft tissue mobilization   Soft tissue mobilization STW to L ITB, inferior knee, posterior knee to decrease pain                     PT Long Term Goals - 04/15/16 1507    PT LONG TERM GOAL #1   Title Improved L knee flexion to allow foot clearance in swing phase of gait.   Time 4   Period Weeks   Status On-going   PT LONG TERM GOAL #2   Title decreased L knee pain to 2-3/10 or less with activity.   Time 4   Period Weeks   Status Achieved               Plan - 05/20/16 1553    Clinical Impression Statement Patient tolerated today's treatment fairly well although she arrived with more R knee pain than L knee pain. Patient continues to demonstrate lack of L knee ROM and strength today. WIth forward step ups she noted that the difficulty came from increasing weightbearing in LLE with forward step ups. Patient utilized BUE in parallel bars with forward step ups as well. Patient continues to present with pain in L distal ITB and inferior knee upon palpation. AROM L knee measured as 10-83 deg in supine with extreme pain at end range flexion. Patient continues to ambulate with FWW with extreme antalgic gait secondary to R knee problem as well as pain with weightbearing in LLE. Patient remains confused as to why she experiences pain in lateral L knee and in L knee flexion.   Rehab Potential Fair   Clinical Impairments Affecting Rehab Potential mulitiple knee surgeries   PT Frequency 2x / week   PT Duration 8 weeks   PT Treatment/Interventions Cryotherapy;Air traffic controller;Therapeutic exercise;Manual techniques;Patient/family education;Neuromuscular re-education;Passive range of motion;Dry needling;Taping   PT Next Visit Plan Continue L knee strengthening with  modalites PRN for pain per MPT POC.   PT Home Exercise Plan active knee flex/ext   Consulted and Agree with Plan of Care Patient      Patient will benefit from skilled therapeutic intervention in order to improve the following deficits and impairments:  Pain, Decreased activity tolerance, Decreased strength, Decreased range of motion, Abnormal gait  Visit Diagnosis: Muscle weakness (generalized)  Pain in left  knee  Other abnormalities of gait and mobility     Problem List Patient Active Problem List   Diagnosis Date Noted  . Diabetes type 2, controlled ([place]) 03/07/2016  . Gout   . Essential hypertension   . Enuresis 11/19/2015  . HLD (hyperlipidemia) 01/04/2014  . HTN (hypertension) 01/04/2014  . Anemia, iron deficiency 12/30/2011  . Constipation 04/04/2009  . TUBULOVILLOUS ADENOMA, COLON 04/03/2009    Ahmed Prima, PTA 05/20/2016 5:58 PM Mali Applegate MPT [facility] [address] [place], [place], [address] Phone: [phone]   Fax:  [phone]  Name: Beth [name] MRN: [id number] Date of Birth: Apr 19, 1946

## 2016-05-22 ENCOUNTER — Encounter: Payer: Medicare Other | Admitting: *Deleted

## 2016-05-22 DIAGNOSIS — Z471 Aftercare following joint replacement surgery: Secondary | ICD-10-CM | POA: Diagnosis not present

## 2016-05-22 DIAGNOSIS — Z96651 Presence of right artificial knee joint: Secondary | ICD-10-CM | POA: Diagnosis not present

## 2016-05-22 DIAGNOSIS — M1712 Unilateral primary osteoarthritis, left knee: Secondary | ICD-10-CM | POA: Diagnosis not present

## 2016-05-26 DIAGNOSIS — G5632 Lesion of radial nerve, left upper limb: Secondary | ICD-10-CM | POA: Diagnosis not present

## 2016-05-28 ENCOUNTER — Ambulatory Visit: Payer: Medicare Other | Admitting: Physical Therapy

## 2016-05-28 ENCOUNTER — Encounter: Payer: Self-pay | Admitting: Physical Therapy

## 2016-05-28 DIAGNOSIS — M6281 Muscle weakness (generalized): Secondary | ICD-10-CM

## 2016-05-28 DIAGNOSIS — R2689 Other abnormalities of gait and mobility: Secondary | ICD-10-CM | POA: Diagnosis not present

## 2016-05-28 DIAGNOSIS — M25562 Pain in left knee: Secondary | ICD-10-CM | POA: Diagnosis not present

## 2016-05-28 NOTE — Therapy (Signed)
[facility] [place], [place], [address] Phone: [phone]   Fax:  [phone]  Physical Therapy Treatment  Patient Details  Name: Beth [name] MRN: [id number] Date of Birth: 1946/02/24 Referring Provider: [name] MD  Encounter Date: 05/28/2016      PT End of Session - 05/28/16 1426    Visit Number 19   Number of Visits 30   Date for PT Re-Evaluation 06/30/16   PT Start Time [id number]   PT Stop Time 1446   PT Time Calculation (min) 49 min   Activity Tolerance Patient tolerated treatment well;Patient limited by pain;Patient limited by fatigue   Behavior During Therapy [facility] for tasks assessed/performed      Past Medical History  Diagnosis Date  . Hypertension   . CVA (cerebral infarction)     2006  . Hyperlipidemia   . Blood transfusion without reported diagnosis 2012    anemia;pt denies transfusion stated was only on iron tablet  . Heart murmur   . Nocturia     3-4 times per night  . Gout     left elbow  . Arthritis     Knee both knees  . Anemia   . 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 08-09-14 Right eye  . Family history of anesthesia complication     sister very slow to awaken after anesthesia;severe vomiting   . Stroke ([place]) 2006    x 1 no deficits noted   . Cataract     left  . CKD (chronic kidney disease), stage III     Past Surgical History  Procedure Laterality Date  . Carpal tunnel release Right 1983  . Abdominal hysterectomy  1983  . Colonscopy  June 21, 2014  . Nasal cauterization  2012  . Total knee arthroplasty Right 07/03/2014    Procedure: RIGHT TOTAL KNEE ARTHROPLASTY;  Surgeon: Gearlean Alf, MD;  [facility]: [facility];  Service: Orthopedics;  Laterality: Right;  . Patellar tendon repair Right 08/11/2014    Procedure: RIGHT PATELLA TENDON REPAIR;  Surgeon: Gearlean Alf, MD;  [facility]: [facility];  Service: Orthopedics;  Laterality: Right;  . Joint replacement  06/2014    right  knee  . I&d knee with poly exchange Right 10/02/2014    Procedure: IRRIGATION AND DEBRIDEMENT RIGHT KNEE WITH POLY EXCHANGE;  Surgeon: Gearlean Alf, MD;  [facility];  Service: Orthopedics;  Laterality: Right;  . Excisional total knee arthroplasty with antibiotic spacers Right 02/16/2015    Procedure: RIGHT KNEE RESECTION ARTHROPLASTY WITH ANTIBIOTIC SPACERS;  Surgeon: [name], MD;  [facility];  Service: Orthopedics;  Laterality: Right;  . Reimplantation of total knee Right 05/23/2015    Procedure: RIGHT KNEE ARTHROPLASTY REIMPLANTATION;  Surgeon: [name], MD;  [facility];  Service: Orthopedics;  Laterality: Right;    There were no vitals filed for this visit.      Subjective Assessment - 05/28/16 1400    Subjective Patient reported less pain overall yet still difficulty with walking   Patient Stated Goals to be able to walk with a quad cane   Currently in Pain? Yes   Pain Score 2    Pain Location Knee   Pain Orientation Left   Pain Descriptors / Indicators Sore   Pain Type Chronic pain   Pain Onset More than a month ago   Pain Frequency Constant   Aggravating Factors  any weight bearing activity   Pain Relieving Factors  at rest                         [facility] Adult PT Treatment/Exercise - 05/28/16 0001    Knee/Hip Exercises: Aerobic   Nustep L6 x15 min, monitored for progression   Knee/Hip Exercises: Seated   Long Arc Quad Strengthening;Left;3 sets;10 reps   Long Arc Quad Weight 4 lbs.  then 4# add squeeze with LAQ no weight x30   Marching Limitations left x30 no weight   Sit to Sand 5 reps;with UE support  right LE forward and Left LE back to focus on left LE   Knee/Hip Exercises: Supine   Straight Leg Raises Strengthening;Left;3 sets;10 reps   Knee/Hip Exercises: Sidelying   Hip ABduction Strengthening;Left;2 sets;10 reps   Cryotherapy   Number Minutes Cryotherapy 15 Minutes   Cryotherapy Location Knee   Type of Cryotherapy  Ice pack   Electrical Stimulation   Electrical Stimulation Location L knee   Electrical Stimulation Action IFC   Electrical Stimulation Parameters 1-10hz    Electrical Stimulation Goals Pain                     PT Long Term Goals - 04/15/16 1507    PT LONG TERM GOAL #1   Title Improved L knee flexion to allow foot clearance in swing phase of gait.   Time 4   Period Weeks   Status On-going   PT LONG TERM GOAL #2   Title decreased L knee pain to 2-3/10 or less with activity.   Time 4   Period Weeks   Status Achieved               Plan - 05/28/16 1432    Clinical Impression Statement Patient progressing slowly due to some increase pain and fatigue with weight bearing and exercises. Patient tolerated well yet some soreness and fatigue. Patient continues to have difficulty with ADL's and walking at home due to weakness in LE's. Patient went to MD and is to cont with therapy as MPT discresion. Patient continues to use walker at this time. Goals ongoing due to ROM and strength deficits.   Rehab Potential Fair   Clinical Impairments Affecting Rehab Potential mulitiple knee surgeries   PT Frequency 2x / week   PT Duration 8 weeks   PT Treatment/Interventions Cryotherapy;Air traffic controller;Therapeutic exercise;Manual techniques;Patient/family education;Neuromuscular re-education;Passive range of motion;Dry needling;Taping   PT Next Visit Plan Continue L knee strengthening with modalites PRN for pain per MPT POC.   Consulted and Agree with Plan of Care Patient      Patient will benefit from skilled therapeutic intervention in order to improve the following deficits and impairments:  Pain, Decreased activity tolerance, Decreased strength, Decreased range of motion, Abnormal gait  Visit Diagnosis: Muscle weakness (generalized)  Pain in left knee     Problem List Patient Active Problem List   Diagnosis Date Noted  . Diabetes  type 2, controlled ([place]) 03/07/2016  . Gout   . Essential hypertension   . Enuresis 11/19/2015  . HLD (hyperlipidemia) 01/04/2014  . HTN (hypertension) 01/04/2014  . Anemia, iron deficiency 12/30/2011  . Constipation 04/04/2009  . TUBULOVILLOUS ADENOMA, COLON 04/03/2009    Janiaya Ryser P, PTA 05/28/2016, 2:49 PM  [facility] [place], [place], [address] Phone: [phone]   Fax:  [phone]  Name: SHIRLA SAILORS MRN: [id number] Date of Birth: 1946/04/14

## 2016-06-03 ENCOUNTER — Encounter: Payer: Self-pay | Admitting: Physical Therapy

## 2016-06-03 ENCOUNTER — Ambulatory Visit: Payer: Medicare Other | Admitting: Physical Therapy

## 2016-06-03 DIAGNOSIS — M25562 Pain in left knee: Secondary | ICD-10-CM

## 2016-06-03 DIAGNOSIS — M6281 Muscle weakness (generalized): Secondary | ICD-10-CM | POA: Diagnosis not present

## 2016-06-03 DIAGNOSIS — R2689 Other abnormalities of gait and mobility: Secondary | ICD-10-CM

## 2016-06-03 NOTE — Therapy (Signed)
[facility] [place], [place], [address] Phone: [phone]   Fax:  [phone]  Physical Therapy Treatment  Patient Details  Name: Beth [name] MRN: [id number] Date of Birth: Sep 17, 1946 Referring Provider: [name] MD  Encounter Date: 06/03/2016      PT End of Session - 06/03/16 1429    Visit Number 20   Number of Visits 30   Date for PT Re-Evaluation 06/30/16   PT Start Time [id number]   PT Stop Time 1521   PT Time Calculation (min) 49 min   Activity Tolerance Patient tolerated treatment well;Patient limited by pain;Patient limited by fatigue   Behavior During Therapy [facility] for tasks assessed/performed      Past Medical History  Diagnosis Date  . Hypertension   . CVA (cerebral infarction)     2006  . Hyperlipidemia   . Blood transfusion without reported diagnosis 2012    anemia;pt denies transfusion stated was only on iron tablet  . Heart murmur   . Nocturia     3-4 times per night  . Gout     left elbow  . Arthritis     Knee both knees  . Anemia   . 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 08-09-14 Right eye  . Family history of anesthesia complication     sister very slow to awaken after anesthesia;severe vomiting   . Stroke ([place]) 2006    x 1 no deficits noted   . Cataract     left  . CKD (chronic kidney disease), stage III     Past Surgical History  Procedure Laterality Date  . Carpal tunnel release Right 1983  . Abdominal hysterectomy  1983  . Colonscopy  June 21, 2014  . Nasal cauterization  2012  . Total knee arthroplasty Right 07/03/2014    Procedure: RIGHT TOTAL KNEE ARTHROPLASTY;  Surgeon: Gearlean Alf, MD;  [facility]: [facility];  Service: Orthopedics;  Laterality: Right;  . Patellar tendon repair Right 08/11/2014    Procedure: RIGHT PATELLA TENDON REPAIR;  Surgeon: Gearlean Alf, MD;  [facility]: [facility];  Service: Orthopedics;  Laterality: Right;  . Joint replacement  06/2014    right  knee  . I&d knee with poly exchange Right 10/02/2014    Procedure: IRRIGATION AND DEBRIDEMENT RIGHT KNEE WITH POLY EXCHANGE;  Surgeon: Gearlean Alf, MD;  [facility];  Service: Orthopedics;  Laterality: Right;  . Excisional total knee arthroplasty with antibiotic spacers Right 02/16/2015    Procedure: RIGHT KNEE RESECTION ARTHROPLASTY WITH ANTIBIOTIC SPACERS;  Surgeon: [name], MD;  [facility];  Service: Orthopedics;  Laterality: Right;  . Reimplantation of total knee Right 05/23/2015    Procedure: RIGHT KNEE ARTHROPLASTY REIMPLANTATION;  Surgeon: [name], MD;  [facility];  Service: Orthopedics;  Laterality: Right;    There were no vitals filed for this visit.      Subjective Assessment - 06/03/16 1429    Subjective Reports that L knee is okay today and R knee is hurting. Reports that she had her wrist brace off and reports pain from elbow to hand.   Patient Stated Goals to be able to walk with a quad cane   Currently in Pain? Yes   Pain Score 3    Pain Location Knee   Pain Orientation Left   Pain Type Chronic pain            [facility] PT Assessment - 06/03/16 0001  Assessment   Medical Diagnosis chronic Left knee pain   Onset Date/Surgical Date 02/12/15   Next MD Visit 05/22/2016   Precautions   Precautions Fall   Precaution Comments FALL RISK                     [facility] Adult PT Treatment/Exercise - 06/24/2016 0001    Knee/Hip Exercises: Aerobic   Nustep L6 x15 min, monitored for progression   Knee/Hip Exercises: Seated   Long Arc Quad Strengthening;Left;3 sets;10 reps   Long Arc Quad Weight 4 lbs.   Marching Limitations LLE 4# weight 3x10 reps   Sit to Sand 10 reps;with UE support   Knee/Hip Exercises: Supine   Straight Leg Raises Strengthening;Left;3 sets;10 reps   Other Supine Knee/Hip Exercises LLE clamshell red theraband x30 reps   Modalities   Modalities Electrical Stimulation;Vasopneumatic   Electrical Stimulation    Electrical Stimulation Location L knee   Electrical Stimulation Action IFC   Electrical Stimulation Parameters 1-10 hz x15 min   Electrical Stimulation Goals Pain   Vasopneumatic   Number Minutes Vasopneumatic  15 minutes   Vasopnuematic Location  Knee   Vasopneumatic Pressure Low   Vasopneumatic Temperature  46                     PT Long Term Goals - 04/15/16 1507    PT LONG TERM GOAL #1   Title Improved L knee flexion to allow foot clearance in swing phase of gait.   Time 4   Period Weeks   Status On-going   PT LONG TERM GOAL #2   Title decreased L knee pain to 2-3/10 or less with activity.   Time 4   Period Weeks   Status Achieved               Plan - June 24, 2016 1512    Clinical Impression Statement Patient continues to progress in regards to L knee strength. Patient able to complete L SLR much better with decreased difficulty and decreased extensor lag observed. Patient limited in regards to sit to stands secondary to medial R knee discomfort. Patient continues to report difficulty ascending stairs as she cannot elevate LLE up onto step. Hip abductors strengthening modified as to not increase R knee pain. Normal modalities response noted following removal of the modalities.   Rehab Potential Fair   Clinical Impairments Affecting Rehab Potential mulitiple knee surgeries   PT Frequency 2x / week   PT Duration 8 weeks   PT Treatment/Interventions Cryotherapy;Air traffic controller;Therapeutic exercise;Manual techniques;Patient/family education;Neuromuscular re-education;Passive range of motion;Dry needling;Taping;Vasopneumatic Device   PT Next Visit Plan Continue L knee strengthening with modalites PRN for pain per MPT POC.   PT Home Exercise Plan active knee flex/ext   Consulted and Agree with Plan of Care Patient      Patient will benefit from skilled therapeutic intervention in order to improve the following deficits  and impairments:  Pain, Decreased activity tolerance, Decreased strength, Decreased range of motion, Abnormal gait  Visit Diagnosis: Muscle weakness (generalized)  Pain in left knee  Other abnormalities of gait and mobility       G-Codes - June 24, 2016 1959    Functional Assessment Tool Used FOTO 20th visit.Marland Kitchen54% limitation.   Functional Limitation Mobility: Walking and moving around   Mobility: Walking and Moving Around Current Status [phone]) At least 40 percent but less than 60 percent impaired, limited or restricted   Mobility: Walking and Moving Around Goal Status (  [id number]) At least 40 percent but less than 60 percent impaired, limited or restricted      Problem List Patient Active Problem List   Diagnosis Date Noted  . Diabetes type 2, controlled ([place]) 03/07/2016  . Gout   . Essential hypertension   . Enuresis 11/19/2015  . HLD (hyperlipidemia) 01/04/2014  . HTN (hypertension) 01/04/2014  . Anemia, iron deficiency 12/30/2011  . Constipation 04/04/2009  . TUBULOVILLOUS ADENOMA, COLON 04/03/2009    APPLEGATE, Mali, PTA 06/03/2016, 8:00 PM Mali Applegate MPT [facility] [place], [place], [address] Phone: [phone]   Fax:  [phone]  Name: Beth [name] MRN: [id number] Date of Birth: Aug 12, 1946

## 2016-06-04 ENCOUNTER — Other Ambulatory Visit: Payer: Self-pay | Admitting: Family Medicine

## 2016-06-05 ENCOUNTER — Ambulatory Visit: Payer: Medicare Other | Admitting: Physical Therapy

## 2016-06-05 ENCOUNTER — Encounter: Payer: Self-pay | Admitting: Physical Therapy

## 2016-06-05 DIAGNOSIS — R2689 Other abnormalities of gait and mobility: Secondary | ICD-10-CM

## 2016-06-05 DIAGNOSIS — M25562 Pain in left knee: Secondary | ICD-10-CM | POA: Diagnosis not present

## 2016-06-05 DIAGNOSIS — M6281 Muscle weakness (generalized): Secondary | ICD-10-CM | POA: Diagnosis not present

## 2016-06-05 NOTE — Therapy (Signed)
[facility] [place], [place], [address] Phone: [phone]   Fax:  [phone]  Physical Therapy Treatment  Patient Details  Name: Beth [name] MRN: [id number] Date of Birth: 10/19/1946 Referring Provider: [name] MD  Encounter Date: 06/05/2016      PT End of Session - 06/05/16 1442    Visit Number 21   Number of Visits 30   Date for PT Re-Evaluation 06/30/16   PT Start Time 1435   PT Stop Time [id number]   PT Time Calculation (min) 43 min   Activity Tolerance Patient tolerated treatment well;Patient limited by pain;Patient limited by fatigue   Behavior During Therapy [facility] for tasks assessed/performed      Past Medical History  Diagnosis Date  . Hypertension   . CVA (cerebral infarction)     2006  . Hyperlipidemia   . Blood transfusion without reported diagnosis 2012    anemia;pt denies transfusion stated was only on iron tablet  . Heart murmur   . Nocturia     3-4 times per night  . Gout     left elbow  . Arthritis     Knee both knees  . Anemia   . 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 08-09-14 Right eye  . Family history of anesthesia complication     sister very slow to awaken after anesthesia;severe vomiting   . Stroke ([place]) 2006    x 1 no deficits noted   . Cataract     left  . CKD (chronic kidney disease), stage III     Past Surgical History  Procedure Laterality Date  . Carpal tunnel release Right 1983  . Abdominal hysterectomy  1983  . Colonscopy  June 21, 2014  . Nasal cauterization  2012  . Total knee arthroplasty Right 07/03/2014    Procedure: RIGHT TOTAL KNEE ARTHROPLASTY;  Surgeon: Gearlean Alf, MD;  Location: [facility];  Service: Orthopedics;  Laterality: Right;  . Patellar tendon repair Right 08/11/2014    Procedure: RIGHT PATELLA TENDON REPAIR;  Surgeon: Gearlean Alf, MD;  Location: [facility];  Service: Orthopedics;  Laterality: Right;  . Joint replacement  06/2014    right  knee  . I&d knee with poly exchange Right 10/02/2014    Procedure: IRRIGATION AND DEBRIDEMENT RIGHT KNEE WITH POLY EXCHANGE;  Surgeon: Gearlean Alf, MD;  [facility]: [facility];  Service: Orthopedics;  Laterality: Right;  . Excisional total knee arthroplasty with antibiotic spacers Right 02/16/2015    Procedure: RIGHT KNEE RESECTION ARTHROPLASTY WITH ANTIBIOTIC SPACERS;  Surgeon: [name], MD;  [facility]: [facility];  Service: Orthopedics;  Laterality: Right;  . Reimplantation of total knee Right 05/23/2015    Procedure: RIGHT KNEE ARTHROPLASTY REIMPLANTATION;  Surgeon: [name], MD;  [facility];  Service: Orthopedics;  Laterality: Right;    There were no vitals filed for this visit.      Subjective Assessment - 06/05/16 1442    Subjective Reports only L knee stiffnes today and R knee feeling better than it did Tuesday.   Patient Stated Goals to be able to walk with a quad cane   Currently in Pain? No/denies            [facility] PT Assessment - 06/05/16 0001    Assessment   Medical Diagnosis chronic Left knee pain   Onset Date/Surgical Date 02/12/15   Next MD Visit 05/22/2016   Precautions   Precautions Fall   Precaution Comments  FALL RISK                     [facility] Adult PT Treatment/Exercise - 06/05/16 0001    Knee/Hip Exercises: Aerobic   Nustep L6 x15 min, monitored for progression   Knee/Hip Exercises: Standing   Forward Step Up Left;1 set;10 reps;Hand Hold: 2;Step Height: 4"   Knee/Hip Exercises: Seated   Long Arc Quad Strengthening;Left;3 sets;10 reps   Long Arc Quad Weight 4 lbs.   Marching Limitations LLE 4# weight 3x10 reps   Sit to Sand 15 reps;with UE support   Knee/Hip Exercises: Supine   Straight Leg Raises Strengthening;Left;3 sets;10 reps                     PT Long Term Goals - 04/15/16 1507    PT LONG TERM GOAL #1   Title Improved L knee flexion to allow foot clearance in swing phase of gait.   Time 4   Period Weeks    Status On-going   PT LONG TERM GOAL #2   Title decreased L knee pain to 2-3/10 or less with activity.   Time 4   Period Weeks   Status Achieved               Plan - 06/05/16 1522    Clinical Impression Statement Patient arrived to treatment with reports of only L knee stiffness today and RLE doing better today. Patient ambulated with better L foot clearance during ambulation with decreased scuffing made by L foot during ambulation. Patient able to complete supine and seated exercises well with no reports of pain today. Patient unable to complete sidelying clamshell or abduction today secondary to pull/popping sensation in L knee. Patient able to complete 4" forward step ups with LLE with improved technique. Patient denied L knee pain with forward step ups.   Rehab Potential Fair   Clinical Impairments Affecting Rehab Potential mulitiple knee surgeries   PT Frequency 2x / week   PT Duration 8 weeks   PT Treatment/Interventions Cryotherapy;Air traffic controller;Therapeutic exercise;Manual techniques;Patient/family education;Neuromuscular re-education;Passive range of motion;Dry needling;Taping;Vasopneumatic Device   PT Next Visit Plan Continue L knee strengthening with modalites PRN for pain per MPT POC.   PT Home Exercise Plan active knee flex/ext   Consulted and Agree with Plan of Care Patient      Patient will benefit from skilled therapeutic intervention in order to improve the following deficits and impairments:  Pain, Decreased activity tolerance, Decreased strength, Decreased range of motion, Abnormal gait  Visit Diagnosis: Muscle weakness (generalized)  Pain in left knee  Other abnormalities of gait and mobility     Problem List Patient Active Problem List   Diagnosis Date Noted  . Diabetes type 2, controlled ([employer]) 03/07/2016  . Gout   . Essential hypertension   . Enuresis 11/19/2015  . HLD (hyperlipidemia) 01/04/2014  . HTN  (hypertension) 01/04/2014  . Anemia, iron deficiency 12/30/2011  . Constipation 04/04/2009  . TUBULOVILLOUS ADENOMA, COLON 04/03/2009    Wynelle Fanny, PTA 06/05/2016, 3:33 PM  [facility] [address] [place], [place], [address] Phone: [phone]   Fax:  [phone]  Name: Beth [name] MRN: [id number] Date of Birth: 1946-03-23

## 2016-06-17 ENCOUNTER — Encounter: Payer: Self-pay | Admitting: Physical Therapy

## 2016-06-17 ENCOUNTER — Ambulatory Visit: Payer: Medicare Other | Attending: Orthopedic Surgery | Admitting: Physical Therapy

## 2016-06-17 DIAGNOSIS — R2689 Other abnormalities of gait and mobility: Secondary | ICD-10-CM | POA: Diagnosis not present

## 2016-06-17 DIAGNOSIS — M25562 Pain in left knee: Secondary | ICD-10-CM | POA: Insufficient documentation

## 2016-06-17 DIAGNOSIS — M6281 Muscle weakness (generalized): Secondary | ICD-10-CM | POA: Diagnosis not present

## 2016-06-17 NOTE — Therapy (Signed)
[facility] [place], [place], [address] Phone: [phone]   Fax:  [phone]  Physical Therapy Treatment  Patient Details  Name: Beth [name] MRN: [id number] Date of Birth: [birth year] Referring Provider: [name] MD  Encounter Date: 06/17/2016      PT End of Session - 06/17/16 1425    Visit Number 22   Number of Visits 30   Date for PT Re-Evaluation 06/30/16   PT Start Time [id number]   PT Stop Time 1513   PT Time Calculation (min) 41 min      Past Medical History  Diagnosis Date  . Hypertension   . CVA (cerebral infarction)     2006  . Hyperlipidemia   . Blood transfusion without reported diagnosis 2012    anemia;pt denies transfusion stated was only on iron tablet  . Heart murmur   . Nocturia     3-4 times per night  . Gout     left elbow  . Arthritis     Knee both knees  . Anemia   . 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 08-09-14 Right eye  . Family history of anesthesia complication     sister very slow to awaken after anesthesia;severe vomiting   . Stroke ([place]) 2006    x 1 no deficits noted   . Cataract     left  . CKD (chronic kidney disease), stage III     Past Surgical History  Procedure Laterality Date  . Carpal tunnel release Right 1983  . Abdominal hysterectomy  1983  . Colonscopy  June 21, 2014  . Nasal cauterization  2012  . Total knee arthroplasty Right 07/03/2014    Procedure: RIGHT TOTAL KNEE ARTHROPLASTY;  Surgeon: Gearlean Alf, MD;  [facility];  Service: Orthopedics;  Laterality: Right;  . Patellar tendon repair Right 08/11/2014    Procedure: RIGHT PATELLA TENDON REPAIR;  Surgeon: Gearlean Alf, MD;  [facility];  Service: Orthopedics;  Laterality: Right;  . Joint replacement  06/2014    right knee  . I&d knee with poly exchange Right 10/02/2014    Procedure: IRRIGATION AND DEBRIDEMENT RIGHT KNEE WITH POLY EXCHANGE;  Surgeon: Gearlean Alf, MD;  [facility];  Service: Orthopedics;  Laterality: Right;  . Excisional total knee arthroplasty with antibiotic spacers Right 02/16/2015    Procedure: RIGHT KNEE RESECTION ARTHROPLASTY WITH ANTIBIOTIC SPACERS;  Surgeon: [name], MD;  [facility];  Service: Orthopedics;  Laterality: Right;  . Reimplantation of total knee Right 05/23/2015    Procedure: RIGHT KNEE ARTHROPLASTY REIMPLANTATION;  Surgeon: [name], MD;  [facility];  Service: Orthopedics;  Laterality: Right;    There were no vitals filed for this visit.      Subjective Assessment - 06/17/16 1425    Subjective Reports that she has a little pain today. Reports that she noticed that it is easier to move her leg into the shower and has a hold on the bars in her shower.   Patient Stated Goals to be able to walk with a quad cane   Currently in Pain? Yes   Pain Score 3    Pain Location Knee   Pain Orientation Left;Medial   Pain Descriptors / Indicators Aching   Pain Type Chronic pain   Pain Onset More than a month ago            [facility] PT Assessment - 06/17/16 0001  Assessment   Medical Diagnosis chronic Left knee pain   Onset Date/Surgical Date 02/12/15   Next MD Visit 07/2016   Precautions   Precautions Fall   Precaution Comments FALL RISK                     [facility] Adult PT Treatment/Exercise - 06/17/16 0001    Knee/Hip Exercises: Aerobic   Nustep L6 x15 min, monitored for progression   Knee/Hip Exercises: Standing   Forward Step Up Left;1 set;10 reps;Hand Hold: 2;Step Height: 4"   Knee/Hip Exercises: Seated   Long Arc Quad Strengthening;Left;3 sets;10 reps   Long Arc Quad Weight 5 lbs.   Marching Limitations LLE 5# weight 3x10 reps   Sit to Sand 15 reps;with UE support  increased difficulty due to R knee pain   Knee/Hip Exercises: Supine   Heel Slides AROM;Left;2 sets;10 reps   Hip Adduction Isometric Strengthening;Both;2 sets;10 reps   Bridges Strengthening;Both;2 sets;10 reps    Straight Leg Raises Strengthening;Left;3 sets;10 reps   Other Supine Knee/Hip Exercises LLE clamshell red theraband x30 reps                     PT Long Term Goals - 04/15/16 1507    PT LONG TERM GOAL #1   Title Improved L knee flexion to allow foot clearance in swing phase of gait.   Time 4   Period Weeks   Status On-going   PT LONG TERM GOAL #2   Title decreased L knee pain to 2-3/10 or less with activity.   Time 4   Period Weeks   Status Achieved               Plan - 06/17/16 1556    Clinical Impression Statement Patient tolerated today's treatment with reports of low level L knee pain. Tolerated all exercises well although she had greater difficulty with sit to stands today secondary to difficulty with R knee. Patient able to tolerate greater resistance with seated ankle weight exercises with no complaint. Patient continues to have difficulty with L forward step ups as she has to pull with UEs due to weakness in LEs. Patient noted that she felt fatigued following end of treatment. Patient continues to ambulate with improved L foot clearance with minimal L foot scuffing on floor during ambulation.   Rehab Potential Fair   Clinical Impairments Affecting Rehab Potential mulitiple knee surgeries   PT Frequency 2x / week   PT Duration 8 weeks   PT Treatment/Interventions Cryotherapy;Air traffic controller;Therapeutic exercise;Manual techniques;Patient/family education;Neuromuscular re-education;Passive range of motion;Dry needling;Taping;Vasopneumatic Device   PT Next Visit Plan Continue L knee strengthening with modalites PRN for pain per MPT POC.   PT Home Exercise Plan active knee flex/ext   Consulted and Agree with Plan of Care Patient      Patient will benefit from skilled therapeutic intervention in order to improve the following deficits and impairments:  Pain, Decreased activity tolerance, Decreased strength, Decreased  range of motion, Abnormal gait  Visit Diagnosis: Muscle weakness (generalized)  Pain in left knee  Other abnormalities of gait and mobility     Problem List Patient Active Problem List   Diagnosis Date Noted  . Diabetes type 2, controlled ([place]) 03/07/2016  . Gout   . Essential hypertension   . Enuresis 11/19/2015  . HLD (hyperlipidemia) 01/04/2014  . HTN (hypertension) 01/04/2014  . Anemia, iron deficiency 12/30/2011  . Constipation 04/04/2009  . TUBULOVILLOUS ADENOMA, COLON 04/03/2009  Wynelle Fanny, PTA 06/17/2016, 4:13 PM  [facility] [address] [place], [place], [address] Phone: [phone]   Fax:  [phone]  Name: Beth [name] MRN: [id number] Date of Birth: 03-May-1946

## 2016-06-18 DIAGNOSIS — N3941 Urge incontinence: Secondary | ICD-10-CM | POA: Diagnosis not present

## 2016-06-18 DIAGNOSIS — R351 Nocturia: Secondary | ICD-10-CM | POA: Diagnosis not present

## 2016-06-19 ENCOUNTER — Ambulatory Visit: Payer: Medicare Other | Admitting: Physical Therapy

## 2016-06-19 ENCOUNTER — Encounter: Payer: Self-pay | Admitting: Physical Therapy

## 2016-06-19 DIAGNOSIS — M25562 Pain in left knee: Secondary | ICD-10-CM | POA: Diagnosis not present

## 2016-06-19 DIAGNOSIS — R2689 Other abnormalities of gait and mobility: Secondary | ICD-10-CM

## 2016-06-19 DIAGNOSIS — M6281 Muscle weakness (generalized): Secondary | ICD-10-CM

## 2016-06-19 NOTE — Therapy (Signed)
[facility] [place], [place], [address] Phone: [phone]   Fax:  [phone]  Physical Therapy Treatment  Patient Details  Name: Beth [name] MRN: [id number] Date of Birth: 02/09/1946 Referring Provider: [name] MD  Encounter Date: 06/19/2016      PT End of Session - 06/19/16 1438    Visit Number 23   Number of Visits 30   Date for PT Re-Evaluation 06/30/16   PT Start Time [id number]   PT Stop Time 1520   PT Time Calculation (min) 47 min   Activity Tolerance Patient tolerated treatment well;Patient limited by pain;Patient limited by fatigue   Behavior During Therapy [facility] for tasks assessed/performed      Past Medical History  Diagnosis Date  . Hypertension   . CVA (cerebral infarction)     2006  . Hyperlipidemia   . Blood transfusion without reported diagnosis 2012    anemia;pt denies transfusion stated was only on iron tablet  . Heart murmur   . Nocturia     3-4 times per night  . Gout     left elbow  . Arthritis     Knee both knees  . Anemia   . 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 08-09-14 Right eye  . Family history of anesthesia complication     sister very slow to awaken after anesthesia;severe vomiting   . Stroke ([place]) 2006    x 1 no deficits noted   . Cataract     left  . CKD (chronic kidney disease), stage III     Past Surgical History  Procedure Laterality Date  . Carpal tunnel release Right 1983  . Abdominal hysterectomy  1983  . Colonscopy  June 21, 2014  . Nasal cauterization  2012  . Total knee arthroplasty Right 07/03/2014    Procedure: RIGHT TOTAL KNEE ARTHROPLASTY;  Surgeon: Gearlean Alf, MD;  [facility];  Service: Orthopedics;  Laterality: Right;  . Patellar tendon repair Right 08/11/2014    Procedure: RIGHT PATELLA TENDON REPAIR;  Surgeon: Gearlean Alf, MD;  [facility];  Service: Orthopedics;  Laterality: Right;  . Joint replacement  06/2014    right  knee  . I&d knee with poly exchange Right 10/02/2014    Procedure: IRRIGATION AND DEBRIDEMENT RIGHT KNEE WITH POLY EXCHANGE;  Surgeon: Gearlean Alf, MD;  [facility];  Service: Orthopedics;  Laterality: Right;  . Excisional total knee arthroplasty with antibiotic spacers Right 02/16/2015    Procedure: RIGHT KNEE RESECTION ARTHROPLASTY WITH ANTIBIOTIC SPACERS;  Surgeon: [name], MD;  [facility];  Service: Orthopedics;  Laterality: Right;  . Reimplantation of total knee Right 05/23/2015    Procedure: RIGHT KNEE ARTHROPLASTY REIMPLANTATION;  Surgeon: [name], MD;  [facility];  Service: Orthopedics;  Laterality: Right;    There were no vitals filed for this visit.      Subjective Assessment - 06/19/16 1438    Patient Stated Goals to be able to walk with a quad cane            [facility] PT Assessment - 06/19/16 0001    Assessment   Medical Diagnosis chronic Left knee pain   Onset Date/Surgical Date 02/12/15   Next MD Visit 07/2016   Precautions   Precautions Fall   Precaution Comments FALL RISK                     [facility]  PT Treatment/Exercise - 06/19/16 0001    Knee/Hip Exercises: Aerobic   Nustep L6 x15 min, monitored for progression   Knee/Hip Exercises: Seated   Long Arc Quad Strengthening;Left;3 sets;10 reps   Long Arc Quad Weight 5 lbs.   Marching Limitations LLE 5# weight 3x10 reps   Hamstring Curl Other (comment)  Attempted but too painful with flexion   Sit to Sand 20 reps;with UE support   Knee/Hip Exercises: Supine   Bridges Strengthening;Both;2 sets;10 reps   Other Supine Knee/Hip Exercises LLE clamshell red theraband x30 reps   Other Supine Knee/Hip Exercises L HS set x20 reps   Modalities   Modalities Ultrasound   Ultrasound   Ultrasound Location L medial knee   Ultrasound Parameters 1.2 w/cm2, 50%, 3.3 mhz x10 min   Ultrasound Goals Pain                     PT Long Term Goals - 04/15/16 1507    PT  LONG TERM GOAL #1   Title Improved L knee flexion to allow foot clearance in swing phase of gait.   Time 4   Period Weeks   Status On-going   PT LONG TERM GOAL #2   Title decreased L knee pain to 2-3/10 or less with activity.   Time 4   Period Weeks   Status Achieved               Plan - 06/19/16 1630    Clinical Impression Statement Patient tolerated today's treatment fairly well although she was limited with exercises due to L knee pain. Patient unable to complete seated L HS curl with green theraband secondary to pain as patient went further into L knee flexion per patient report. Completed all other seatd and supine exercises well and patient was able to complete HS set in supine. Patient continued to report L medial knee pain and [place] was completed to the painful region with normal response.    Rehab Potential Fair   Clinical Impairments Affecting Rehab Potential mulitiple knee surgeries   PT Frequency 2x / week   PT Duration 8 weeks   PT Treatment/Interventions Cryotherapy;Air traffic controller;Therapeutic exercise;Manual techniques;Patient/family education;Neuromuscular re-education;Passive range of motion;Dry needling;Taping;Vasopneumatic Device   PT Next Visit Plan Continue L knee strengthening with modalites PRN for pain per MPT POC.   PT Home Exercise Plan active knee flex/ext   Consulted and Agree with Plan of Care Patient      Patient will benefit from skilled therapeutic intervention in order to improve the following deficits and impairments:  Pain, Decreased activity tolerance, Decreased strength, Decreased range of motion, Abnormal gait  Visit Diagnosis: Muscle weakness (generalized)  Pain in left knee  Other abnormalities of gait and mobility     Problem List Patient Active Problem List   Diagnosis Date Noted  . Diabetes type 2, controlled ([place]) 03/07/2016  . Gout   . Essential hypertension   . Enuresis 11/19/2015   . HLD (hyperlipidemia) 01/04/2014  . HTN (hypertension) 01/04/2014  . Anemia, iron deficiency 12/30/2011  . Constipation 04/04/2009  . TUBULOVILLOUS ADENOMA, COLON 04/03/2009    Wynelle Fanny, PTA 06/19/2016, 4:34 PM  [facility] [address] [place], [place], [address] Phone: [phone]   Fax:  [phone]  Name: Beth [name] MRN: [id number] Date of Birth: 01-Jun-1946

## 2016-06-24 ENCOUNTER — Ambulatory Visit: Payer: Medicare Other | Admitting: *Deleted

## 2016-06-24 DIAGNOSIS — M25562 Pain in left knee: Secondary | ICD-10-CM

## 2016-06-24 DIAGNOSIS — M6281 Muscle weakness (generalized): Secondary | ICD-10-CM | POA: Diagnosis not present

## 2016-06-24 DIAGNOSIS — R2689 Other abnormalities of gait and mobility: Secondary | ICD-10-CM | POA: Diagnosis not present

## 2016-06-24 NOTE — Therapy (Signed)
[facility] [place], [place], [address] Phone: [phone]   Fax:  [phone]  Physical Therapy Treatment  Patient Details  Name: Beth [name] MRN: [id number] Date of Birth: August 29, 1946 Referring Provider: [name] MD  Encounter Date: 06/24/2016      PT End of Session - 06/24/16 1510    Visit Number 24   Number of Visits 30   Date for PT Re-Evaluation 06/30/16   PT Start Time 1430   PT Stop Time [id number]   PT Time Calculation (min) 49 min      Past Medical History  Diagnosis Date  . Hypertension   . CVA (cerebral infarction)     2006  . Hyperlipidemia   . Blood transfusion without reported diagnosis 2012    anemia;pt denies transfusion stated was only on iron tablet  . Heart murmur   . Nocturia     3-4 times per night  . Gout     left elbow  . Arthritis     Knee both knees  . Anemia   . 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 08-09-14 Right eye  . Family history of anesthesia complication     sister very slow to awaken after anesthesia;severe vomiting   . Stroke ([place]) 2006    x 1 no deficits noted   . Cataract     left  . CKD (chronic kidney disease), stage III     Past Surgical History  Procedure Laterality Date  . Carpal tunnel release Right 1983  . Abdominal hysterectomy  1983  . Colonscopy  June 21, 2014  . Nasal cauterization  2012  . Total knee arthroplasty Right 07/03/2014    Procedure: RIGHT TOTAL KNEE ARTHROPLASTY;  Surgeon: Gearlean Alf, MD;  Location: [facility];  Service: Orthopedics;  Laterality: Right;  . Patellar tendon repair Right 08/11/2014    Procedure: RIGHT PATELLA TENDON REPAIR;  Surgeon: Gearlean Alf, MD;  Location: [facility];  Service: Orthopedics;  Laterality: Right;  . Joint replacement  06/2014    right knee  . I&d knee with poly exchange Right 10/02/2014    Procedure: IRRIGATION AND DEBRIDEMENT RIGHT KNEE WITH POLY EXCHANGE;  Surgeon: Gearlean Alf, MD;  Location:  [facility];  Service: Orthopedics;  Laterality: Right;  . Excisional total knee arthroplasty with antibiotic spacers Right 02/16/2015    Procedure: RIGHT KNEE RESECTION ARTHROPLASTY WITH ANTIBIOTIC SPACERS;  Surgeon: [name], MD;  Location: [facility];  Service: Orthopedics;  Laterality: Right;  . Reimplantation of total knee Right 05/23/2015    Procedure: RIGHT KNEE ARTHROPLASTY REIMPLANTATION;  Surgeon: [name], MD;  Location: [facility];  Service: Orthopedics;  Laterality: Right;    There were no vitals filed for this visit.      Subjective Assessment - 06/24/16 1436    Subjective Reports that she has a little pain today. Reports that she noticed that it is easier to move her leg into the shower and has a hold on the bars in her shower. LT knee is doing better   Patient Stated Goals to be able to walk with a quad cane   Currently in Pain? Yes   Pain Score 2    Pain Location Knee   Pain Orientation Left;Medial   Pain Descriptors / Indicators Aching   Pain Type Chronic pain   Pain Onset More than a month ago   Pain Frequency Constant  [facility] Adult PT Treatment/Exercise - 06/24/16 0001    Knee/Hip Exercises: Aerobic   Nustep L6 x15 min, monitored for progression   Knee/Hip Exercises: Standing   Hip Flexion Left;1 set;10 reps  Pt is unable to stand on RT LE very long makes this challeng   Knee/Hip Exercises: Seated   Long Arc Quad Strengthening;Left;3 sets;10 reps   Long Arc Quad Weight 5 lbs.   Other Seated Knee/Hip Exercises 3x10 sitting on edge of high mat   Sit to Sand 20 reps;with UE support   Knee/Hip Exercises: Supine   Bridges Strengthening;Both;2 sets;10 reps   Straight Leg Raises Strengthening;Left;3 sets;10 reps   Other Supine Knee/Hip Exercises LLE clamshell red theraband x30 reps   Other Supine Knee/Hip Exercises L HS set x20 reps                     PT Long Term Goals - 04/15/16 1507    PT LONG TERM GOAL #1    Title Improved L knee flexion to allow foot clearance in swing phase of gait.   Time 4   Period Weeks   Status On-going   PT LONG TERM GOAL #2   Title decreased L knee pain to 2-3/10 or less with activity.   Time 4   Period Weeks   Status Achieved               Plan - 06/24/16 1513    Clinical Impression Statement Pt did fairly well today with Exs and Act.'s for LT knee and was able to complete all exs with minimal increase in pain. She feels that strengthening exs are helping decrease pain and increase function. She still has a slow gait with FWW  and a short stance phase on RT LE as she advances LT LE. She had difficulty raising LT LE in standing today, but was due to inability to stand on RT LE very long. goals are ongoing.    Rehab Potential Fair   Clinical Impairments Affecting Rehab Potential mulitiple knee surgeries   PT Frequency 2x / week   PT Duration 8 weeks   PT Treatment/Interventions Cryotherapy;Air traffic controller;Therapeutic exercise;Manual techniques;Patient/family education;Neuromuscular re-education;Passive range of motion;Dry needling;Taping;Vasopneumatic Device   PT Next Visit Plan Continue L knee strengthening with modalites PRN for pain per MPT POC.   PT Home Exercise Plan active knee flex/ext   Consulted and Agree with Plan of Care Patient      Patient will benefit from skilled therapeutic intervention in order to improve the following deficits and impairments:  Pain, Decreased activity tolerance, Decreased strength, Decreased range of motion, Abnormal gait  Visit Diagnosis: Muscle weakness (generalized)  Pain in left knee     Problem List Patient Active Problem List   Diagnosis Date Noted  . Diabetes type 2, controlled ([place]) 03/07/2016  . Gout   . Essential hypertension   . Enuresis 11/19/2015  . HLD (hyperlipidemia) 01/04/2014  . HTN (hypertension) 01/04/2014  . Anemia, iron deficiency 12/30/2011  .  Constipation 04/04/2009  . TUBULOVILLOUS ADENOMA, COLON 04/03/2009    Divine Imber,CHRIS, PTA 06/24/2016, 4:00 PM  [facility] [place], [place], [address] Phone: [phone]   Fax:  [phone]  Name: Beth [name] MRN: [id number] Date of Birth: April 05, 1946

## 2016-06-26 ENCOUNTER — Encounter: Payer: Medicare Other | Admitting: *Deleted

## 2016-07-01 ENCOUNTER — Ambulatory Visit: Payer: Medicare Other | Admitting: *Deleted

## 2016-07-01 DIAGNOSIS — M6281 Muscle weakness (generalized): Secondary | ICD-10-CM

## 2016-07-01 DIAGNOSIS — R2689 Other abnormalities of gait and mobility: Secondary | ICD-10-CM | POA: Diagnosis not present

## 2016-07-01 DIAGNOSIS — M25562 Pain in left knee: Secondary | ICD-10-CM

## 2016-07-01 NOTE — Therapy (Signed)
[facility] [place], [place], [address] Phone: [phone]   Fax:  [phone]  Physical Therapy Treatment  Patient Details  Name: Beth [name] MRN: [id number] Date of Birth: Oct 06, 1946 Referring Provider: [name] MD  Encounter Date: 07/01/2016      PT End of Session - 07/01/16 1502    Visit Number 25   Number of Visits 30   Date for PT Re-Evaluation 06/30/16   PT Start Time 1430   PT Stop Time 1520   PT Time Calculation (min) 50 min      Past Medical History:  Diagnosis Date  . Anemia   . Arthritis    Knee both knees  . Blood transfusion without reported diagnosis 2012   anemia;pt denies transfusion stated was only on iron tablet  . Cataract    left  . CKD (chronic kidney disease), stage III   . CVA (cerebral infarction)    2006  . Family history of anesthesia complication    sister very slow to awaken after anesthesia;severe vomiting   . Gout    left elbow  . Heart murmur   . 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 08-09-14 Right eye  . Hyperlipidemia   . Hypertension   . Nocturia    3-4 times per night  . Stroke [facility]) 2006   x 1 no deficits noted     Past Surgical History:  Procedure Laterality Date  . ABDOMINAL HYSTERECTOMY  1983  . CARPAL TUNNEL RELEASE Right 1983  . colonscopy  June 21, 2014  . EXCISIONAL TOTAL KNEE ARTHROPLASTY WITH ANTIBIOTIC SPACERS Right 02/16/2015   Procedure: RIGHT KNEE RESECTION ARTHROPLASTY WITH ANTIBIOTIC SPACERS;  Surgeon: [name], MD;  [facility]: [facility];  Service: Orthopedics;  Laterality: Right;  . I&D KNEE WITH POLY EXCHANGE Right 10/02/2014   Procedure: IRRIGATION AND DEBRIDEMENT RIGHT KNEE WITH POLY EXCHANGE;  Surgeon: Gearlean Alf, MD;  [facility]: [facility];  Service: Orthopedics;  Laterality: Right;  . JOINT REPLACEMENT  06/2014   right knee  . nasal cauterization  2012  . PATELLAR TENDON REPAIR Right 08/11/2014   Procedure: RIGHT PATELLA TENDON  REPAIR;  Surgeon: Gearlean Alf, MD;  [facility]: [facility];  Service: Orthopedics;  Laterality: Right;  . REIMPLANTATION OF TOTAL KNEE Right 05/23/2015   Procedure: RIGHT KNEE ARTHROPLASTY REIMPLANTATION;  Surgeon: [name], MD;  [facility];  Service: Orthopedics;  Laterality: Right;  . TOTAL KNEE ARTHROPLASTY Right 07/03/2014   Procedure: RIGHT TOTAL KNEE ARTHROPLASTY;  Surgeon: Gearlean Alf, MD;  [facility];  Service: Orthopedics;  Laterality: Right;    There were no vitals filed for this visit.      Subjective Assessment - 07/01/16 1500    Subjective LT knee pain medial part 3-4/10   Limitations Standing;Walking;House hold activities   Patient Stated Goals to be able to walk with a quad cane   Pain Score 4    Pain Location Knee   Pain Orientation Left;Medial   Pain Descriptors / Indicators Aching   Pain Type Chronic pain   Pain Onset More than a month ago   Aggravating Factors  anything WB   Pain Relieving Factors rest                         [facility] Adult PT Treatment/Exercise - 07/01/16 0001      Knee/Hip Exercises: Aerobic   Nustep L6 x15 min, monitored for progression  Knee/Hip Exercises: Standing   Hip Flexion Left;1 set;10 reps  Pt is unable to stand on RT LE very long makes this challeng     Knee/Hip Exercises: Seated   Long Arc Quad Strengthening;Left;3 sets;10 reps   Long Arc Quad Weight 5 lbs.   Other Seated Knee/Hip Exercises 3x fatigue sitting on edge of high mat   Sit to Sand with UE support;10 reps     Knee/Hip Exercises: Supine   Bridges Strengthening;Both;2 sets;10 reps   Straight Leg Raises Strengthening;Left;3 sets;10 reps   Other Supine Knee/Hip Exercises LLE clamshell red theraband x30 reps                     PT Long Term Goals - 04/15/16 1507      PT LONG TERM GOAL #1   Title Improved L knee flexion to allow foot clearance in swing phase of gait.   Time 4   Period Weeks   Status On-going      PT LONG TERM GOAL #2   Title decreased L knee pain to 2-3/10 or less with activity.   Time 4   Period Weeks   Status Achieved               Plan - 07/01/16 1510    Clinical Impression Statement Pt Did fairly well  today with Rx and was able to perform all exs with Minimal increase in LT knee pain. Her greatest challenge is standing and lifting Her LT Leg due to she is unable to WB very long on RT LE due to weakness and flexion  contracture. Possible DC on Thursday   Rehab Potential Fair   Clinical Impairments Affecting Rehab Potential mulitiple knee surgeries   PT Frequency 2x / week   PT Duration 8 weeks   PT Treatment/Interventions Cryotherapy;Air traffic controller;Therapeutic exercise;Manual techniques;Patient/family education;Neuromuscular re-education;Passive range of motion;Dry needling;Taping;Vasopneumatic Device   PT Next Visit Plan Continue L knee strengthening with modalites PRN for pain per MPT POC.   PT Home Exercise Plan active knee flex/ext   Consulted and Agree with Plan of Care Patient      Patient will benefit from skilled therapeutic intervention in order to improve the following deficits and impairments:  Pain, Decreased activity tolerance, Decreased strength, Decreased range of motion, Abnormal gait  Visit Diagnosis: Muscle weakness (generalized)  Pain in left knee     Problem List Patient Active Problem List   Diagnosis Date Noted  . Diabetes type 2, controlled ([place]) 03/07/2016  . Gout   . Essential hypertension   . Enuresis 11/19/2015  . HLD (hyperlipidemia) 01/04/2014  . HTN (hypertension) 01/04/2014  . Anemia, iron deficiency 12/30/2011  . Constipation 04/04/2009  . TUBULOVILLOUS ADENOMA, COLON 04/03/2009    Khaleel Beckom,CHRIS, PTA 07/01/2016, 6:21 PM  [facility] [place], [place], [address] Phone: [phone]   Fax:  [phone]  Name: Beth [name] MRN: [id number] Date of Birth: [birth year]

## 2016-07-03 ENCOUNTER — Ambulatory Visit: Payer: Medicare Other | Admitting: *Deleted

## 2016-07-03 DIAGNOSIS — M6281 Muscle weakness (generalized): Secondary | ICD-10-CM | POA: Diagnosis not present

## 2016-07-03 DIAGNOSIS — M25562 Pain in left knee: Secondary | ICD-10-CM | POA: Diagnosis not present

## 2016-07-03 DIAGNOSIS — R2689 Other abnormalities of gait and mobility: Secondary | ICD-10-CM | POA: Diagnosis not present

## 2016-07-03 NOTE — Patient Instructions (Addendum)
[employer]   Copyright  [phone] [employer]. While lying on your back, Then tighten your lower abdominals, squeeze your buttocks and then raise your buttocks off the floor/bed.         LONG ARC QUAD - LAQ - HIGH SEAT     While seated with your knee in a bent position, slowly straighten your knee as you raise your foot upwards as shown.         While lying on your back, raise up your leg with a straight knee.  Keep the opposite knee bent with the foot lpanted on the ground.     lying on your back, raise up your leg with a straight knee.  Keep the opposite knee bent with the foot lpanted on the ground.     Seated HiStabilization  Wp Abduction with hile sitting in chair, wrap the exercise band around your thighs close to the knees. Hold an end of the band in each hand to maintain pressure of the band around the legs.Slowly pull knees apart, stretching the band. Hold for 3 seconds. Slowly return to starting position. Repeat 10 times or until fatigue.

## 2016-07-03 NOTE — Therapy (Signed)
[facility] [place], [place], [address] Phone: [phone]   Fax:  [phone]  Physical Therapy Treatment  Patient Details  Name: Beth [name] MRN: [id number] Date of Birth: 1946-05-17 Referring Provider: [name] MD  Encounter Date: 07/03/2016      PT End of Session - 07/03/16 1548    Visit Number 26   Number of Visits 30   PT Start Time 1430   PT Stop Time 7014   PT Time Calculation (min) 46 min   Activity Tolerance Patient tolerated treatment well;Patient limited by pain;Patient limited by fatigue   Behavior During Therapy [facility] for tasks assessed/performed      Past Medical History:  Diagnosis Date  . Anemia   . Arthritis    Knee both knees  . Blood transfusion without reported diagnosis 2012   anemia;pt denies transfusion stated was only on iron tablet  . Cataract    left  . CKD (chronic kidney disease), stage III   . CVA (cerebral infarction)    2006  . Family history of anesthesia complication    sister very slow to awaken after anesthesia;severe vomiting   . Gout    left elbow  . Heart murmur   . 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 08-09-14 Right eye  . Hyperlipidemia   . Hypertension   . Nocturia    3-4 times per night  . Stroke [facility]) 2006   x 1 no deficits noted     Past Surgical History:  Procedure Laterality Date  . ABDOMINAL HYSTERECTOMY  1983  . CARPAL TUNNEL RELEASE Right 1983  . colonscopy  June 21, 2014  . EXCISIONAL TOTAL KNEE ARTHROPLASTY WITH ANTIBIOTIC SPACERS Right 02/16/2015   Procedure: RIGHT KNEE RESECTION ARTHROPLASTY WITH ANTIBIOTIC SPACERS;  Surgeon: [name], MD;  [facility];  Service: Orthopedics;  Laterality: Right;  . I&D KNEE WITH POLY EXCHANGE Right 10/02/2014   Procedure: IRRIGATION AND DEBRIDEMENT RIGHT KNEE WITH POLY EXCHANGE;  Surgeon: Gearlean Alf, MD;  [facility];  Service: Orthopedics;  Laterality: Right;  . JOINT REPLACEMENT   06/2014   right knee  . nasal cauterization  2012  . PATELLAR TENDON REPAIR Right 08/11/2014   Procedure: RIGHT PATELLA TENDON REPAIR;  Surgeon: Gearlean Alf, MD;  [facility];  Service: Orthopedics;  Laterality: Right;  . REIMPLANTATION OF TOTAL KNEE Right 05/23/2015   Procedure: RIGHT KNEE ARTHROPLASTY REIMPLANTATION;  Surgeon: [name], MD;  [facility];  Service: Orthopedics;  Laterality: Right;  . TOTAL KNEE ARTHROPLASTY Right 07/03/2014   Procedure: RIGHT TOTAL KNEE ARTHROPLASTY;  Surgeon: Gearlean Alf, MD;  [facility];  Service: Orthopedics;  Laterality: Right;    There were no vitals filed for this visit.      Subjective Assessment - 07/03/16 1442    Subjective LT knee pain medial part 3-4/10   Limitations Standing;Walking;House hold activities   Patient Stated Goals to be able to walk with a quad cane   Currently in Pain? Yes   Pain Score 4    Pain Location Knee   Pain Orientation Left   Pain Descriptors / Indicators Aching   Pain Type Chronic pain   Pain Onset More than a month ago   Pain Frequency Constant   Aggravating Factors  WB  PT Education - 07/12/16 1515    Education provided Yes   Education Details LE EXS   Person(s) Educated Patient   Methods Explanation;Demonstration;Tactile cues;Verbal cues;Handout   Comprehension Verbalized understanding;Returned demonstration             PT Long Term Goals - 2016-07-12 1520      PT LONG TERM GOAL #1   Title Improved L knee flexion to allow foot clearance in swing phase of gait.  NM due to WB deficit on RT LE   Time 4   Period Weeks   Status Not Met     PT LONG TERM GOAL #2   Title decreased L knee pain to 2-3/10 or less with activity.   Time 4   Period Weeks   Status Achieved     PT LONG TERM GOAL #3   Title Decrease edema to within 2.5 cms of contralateral side to assist with range of motion gains and decrease pain.  NM  RT knee 48 cm/ RT 52cm   Time 4   Period Weeks   Status Not Met     PT LONG TERM GOAL #4   Title decreased pain in R knee to 4/10 or less with ADLS/ambulation   Period Weeks   Status Achieved     PT LONG TERM GOAL #5   Title able to amb 75 ft with [facility] with SBA in clinic  NM due to Pt needs FWW still   Time 4   Period Weeks   Status Not Met               Plan - 07/12/2016 1549    Clinical Impression Statement Pt did well with [employer] today for HEP and Gym program. Pt was able to meet 2/5 LTGs. She continues to have difficulty with standing Act.'s and ambulation, but this is greatly affected by her RT knee ROM and strength deficits. She continues to need a FWW for gait  for safety reasons. Her pain has decreased in LT knee and her strength has progressed. She is independent in her HEP and she will join are Gym program to continue with her Ex program   Rehab Potential Fair   Clinical Impairments Affecting Rehab Potential mulitiple knee surgeries   PT Frequency 2x / week   PT Duration 8 weeks   PT Treatment/Interventions Cryotherapy;Air traffic controller;Therapeutic exercise;Manual techniques;Patient/family education;Neuromuscular re-education;Passive range of motion;Dry needling;Taping;Vasopneumatic Device   PT Next Visit Plan DC to HEP   Consulted and Agree with Plan of Care Patient      Patient will benefit from skilled therapeutic intervention in order to improve the following deficits and impairments:  Pain, Decreased activity tolerance, Decreased strength, Decreased range of motion, Abnormal gait  Visit Diagnosis: Muscle weakness (generalized)  Pain in left knee       G-Codes - 07-12-2016 1834    Functional Assessment Tool Used FOTO 26th visit 74% limitation DC      Problem List Patient Active Problem List   Diagnosis Date Noted  . Diabetes type 2, controlled ([place]) 03/07/2016  . Gout   . Essential hypertension   . Enuresis  11/19/2015  . HLD (hyperlipidemia) 01/04/2014  . HTN (hypertension) 01/04/2014  . Anemia, iron deficiency 12/30/2011  . Constipation 04/04/2009  . TUBULOVILLOUS ADENOMA, COLON 04/03/2009    Crescentia Boutwell,CHRIS, PTA 07-12-2016, 6:38 PM  [facility] [address] [place], [place], [address] Phone: [phone]   Fax:  [phone]  Name: Beth [name]  MRN: [id number] Date of Birth: 1946/02/25

## 2016-07-04 NOTE — Therapy (Signed)
[facility] [place], [place], [address] Phone: [phone]   Fax:  [phone]  Physical Therapy Treatment  Patient Details  Name: TASHYRA ADDUCI MRN: [id number] Date of Birth: 1946-05-17 Referring Provider: [name] MD  Encounter Date: 07/03/2016      PT End of Session - 07/03/16 1548    Visit Number 26   Number of Visits 30   PT Start Time 1430   PT Stop Time 7014   PT Time Calculation (min) 46 min   Activity Tolerance Patient tolerated treatment well;Patient limited by pain;Patient limited by fatigue   Behavior During Therapy [facility] for tasks assessed/performed      Past Medical History:  Diagnosis Date  . Anemia   . Arthritis    Knee both knees  . Blood transfusion without reported diagnosis 2012   anemia;pt denies transfusion stated was only on iron tablet  . Cataract    left  . CKD (chronic kidney disease), stage III   . CVA (cerebral infarction)    2006  . Family history of anesthesia complication    sister very slow to awaken after anesthesia;severe vomiting   . Gout    left elbow  . Heart murmur   . 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 08-09-14 Right eye  . Hyperlipidemia   . Hypertension   . Nocturia    3-4 times per night  . Stroke [facility]) 2006   x 1 no deficits noted     Past Surgical History:  Procedure Laterality Date  . ABDOMINAL HYSTERECTOMY  1983  . CARPAL TUNNEL RELEASE Right 1983  . colonscopy  June 21, 2014  . EXCISIONAL TOTAL KNEE ARTHROPLASTY WITH ANTIBIOTIC SPACERS Right 02/16/2015   Procedure: RIGHT KNEE RESECTION ARTHROPLASTY WITH ANTIBIOTIC SPACERS;  Surgeon: [name], MD;  Location: [facility];  Service: Orthopedics;  Laterality: Right;  . I&D KNEE WITH POLY EXCHANGE Right 10/02/2014   Procedure: IRRIGATION AND DEBRIDEMENT RIGHT KNEE WITH POLY EXCHANGE;  Surgeon: Gearlean Alf, MD;  Location: [facility];  Service: Orthopedics;  Laterality: Right;  . JOINT REPLACEMENT   06/2014   right knee  . nasal cauterization  2012  . PATELLAR TENDON REPAIR Right 08/11/2014   Procedure: RIGHT PATELLA TENDON REPAIR;  Surgeon: Gearlean Alf, MD;  Location: [facility];  Service: Orthopedics;  Laterality: Right;  . REIMPLANTATION OF TOTAL KNEE Right 05/23/2015   Procedure: RIGHT KNEE ARTHROPLASTY REIMPLANTATION;  Surgeon: [name], MD;  Location: [facility];  Service: Orthopedics;  Laterality: Right;  . TOTAL KNEE ARTHROPLASTY Right 07/03/2014   Procedure: RIGHT TOTAL KNEE ARTHROPLASTY;  Surgeon: Gearlean Alf, MD;  Location: [facility];  Service: Orthopedics;  Laterality: Right;    There were no vitals filed for this visit.      Subjective Assessment - 07/03/16 1442    Subjective LT knee pain medial part 3-4/10   Limitations Standing;Walking;House hold activities   Patient Stated Goals to be able to walk with a quad cane   Currently in Pain? Yes   Pain Score 4    Pain Location Knee   Pain Orientation Left   Pain Descriptors / Indicators Aching   Pain Type Chronic pain   Pain Onset More than a month ago   Pain Frequency Constant   Aggravating Factors  WB  PT Education - 2016-07-09 1515    Education provided Yes   Education Details LE EXS   Person(s) Educated Patient   Methods Explanation;Demonstration;Tactile cues;Verbal cues;Handout   Comprehension Verbalized understanding;Returned demonstration             PT Long Term Goals - July 09, 2016 1520      PT LONG TERM GOAL #1   Title Improved L knee flexion to allow foot clearance in swing phase of gait.  NM due to WB deficit on RT LE   Time 4   Period Weeks   Status Not Met     PT LONG TERM GOAL #2   Title decreased L knee pain to 2-3/10 or less with activity.   Time 4   Period Weeks   Status Achieved     PT LONG TERM GOAL #3   Title Decrease edema to within 2.5 cms of contralateral side to assist with range of motion gains and decrease pain.  NM  RT knee 48 cm/ RT 52cm   Time 4   Period Weeks   Status Not Met     PT LONG TERM GOAL #4   Title decreased pain in R knee to 4/10 or less with ADLS/ambulation   Period Weeks   Status Achieved     PT LONG TERM GOAL #5   Title able to amb 75 ft with [facility] with SBA in clinic  NM due to Pt needs FWW still   Time 4   Period Weeks   Status Not Met               Plan - 07-09-16 1549    Clinical Impression Statement Pt did well with [employer] today for HEP and Gym program. Pt was able to meet 2/5 LTGs. She continues to have difficulty with standing Act.'s and ambulation, but this is greatly affected by her RT knee ROM and strength deficits. She continues to need a FWW for gait  for safety reasons. Her pain has decreased in LT knee and her strength has progressed. She is independent in her HEP and she will join are Gym program to continue with her Ex program   Rehab Potential Fair   Clinical Impairments Affecting Rehab Potential mulitiple knee surgeries   PT Frequency 2x / week   PT Duration 8 weeks   PT Treatment/Interventions Cryotherapy;Air traffic controller;Therapeutic exercise;Manual techniques;Patient/family education;Neuromuscular re-education;Passive range of motion;Dry needling;Taping;Vasopneumatic Device   PT Next Visit Plan DC to HEP   Consulted and Agree with Plan of Care Patient      Patient will benefit from skilled therapeutic intervention in order to improve the following deficits and impairments:  Pain, Decreased activity tolerance, Decreased strength, Decreased range of motion, Abnormal gait  Visit Diagnosis: Muscle weakness (generalized)  Pain in left knee       G-Codes - 2016/07/09 1834    Functional Assessment Tool Used FOTO 26th visit 74% limitation DC   Functional Limitation Mobility: Walking and moving around   Mobility: Walking and Moving Around Current Status [phone]) At least 60 percent but less than 80 percent  impaired, limited or restricted   Mobility: Walking and Moving Around Goal Status [phone]) At least 60 percent but less than 80 percent impaired, limited or restricted      Problem List Patient Active Problem List   Diagnosis Date Noted  . Diabetes type 2, controlled ([place]) 03/07/2016  . Gout   . Essential hypertension   . Enuresis 11/19/2015  . HLD (hyperlipidemia)  01/04/2014  . HTN (hypertension) 01/04/2014  . Anemia, iron deficiency 12/30/2011  . Constipation 04/04/2009  . TUBULOVILLOUS ADENOMA, COLON 04/03/2009   PHYSICAL THERAPY DISCHARGE SUMMARY  Visits from Start of Care: 26  Current functional level related to goals / functional outcomes: Please see above.   Remaining deficits: Continued gait deviations; right knee pain and edema.   Education / Equipment: HEP. Plan: Patient agrees to discharge.  Patient goals were partially met. Patient is being discharged due to                                                     ?????      Janaya Broy, [place] MPT 07/04/2016, 12:21 PM  [facility] [place], [place], [address] Phone: [phone]   Fax:  [phone]  Name: ILHAN DEBENEDETTO MRN: [id number] Date of Birth: January 09, 1946

## 2016-07-25 DIAGNOSIS — G5632 Lesion of radial nerve, left upper limb: Secondary | ICD-10-CM | POA: Diagnosis not present

## 2016-08-27 ENCOUNTER — Encounter: Payer: Self-pay | Admitting: Family Medicine

## 2016-08-27 ENCOUNTER — Ambulatory Visit (INDEPENDENT_AMBULATORY_CARE_PROVIDER_SITE_OTHER): Payer: Medicare Other | Admitting: Family Medicine

## 2016-08-27 VITALS — BP 138/86 | HR 66 | Temp 98.1°F | Ht 65.0 in | Wt 194.4 lb

## 2016-08-27 DIAGNOSIS — I10 Essential (primary) hypertension: Secondary | ICD-10-CM

## 2016-08-27 DIAGNOSIS — E785 Hyperlipidemia, unspecified: Secondary | ICD-10-CM | POA: Diagnosis not present

## 2016-08-27 DIAGNOSIS — E119 Type 2 diabetes mellitus without complications: Secondary | ICD-10-CM | POA: Diagnosis not present

## 2016-08-27 DIAGNOSIS — M1A9XX0 Chronic gout, unspecified, without tophus (tophi): Secondary | ICD-10-CM | POA: Diagnosis not present

## 2016-08-27 DIAGNOSIS — Z23 Encounter for immunization: Secondary | ICD-10-CM | POA: Diagnosis not present

## 2016-08-27 LAB — BAYER DCA HB A1C WAIVED: HB A1C (BAYER DCA - WAIVED): 6.3 % (ref <7.0)

## 2016-08-27 MED ORDER — OLMESARTAN MEDOXOMIL-HCTZ 40-12.5 MG PO TABS: 1.0000 | ORAL_TABLET | Freq: Every day | ORAL | 5 refills | Status: DC

## 2016-08-27 NOTE — Progress Notes (Signed)
Subjective:  Patient ID: Beth [name], female    DOB: July 15, 1946  Age: 70 y.o. MRN: [id number]  CC: Diabetes (pt here for routine diabetic follow up, she has her eye exam scheduled with [facility].)   HPI Beth [name] presents for  follow-up of hypertension. Patient has no history of headache chest pain or shortness of breath or recent cough. Patient also denies symptoms of TIA such as numbness weakness lateralizing. Patient checks  blood pressure at home and has not had any elevated readings recently. Patient denies side effects from his medication. States taking it regularly.  Patient also  in for follow-up of elevated cholesterol. Doing well without complaints on current medication. Denies side effects of statin including myalgia and arthralgia and nausea. Also in today for liver function testing. Currently no chest pain, shortness of breath or other cardiovascular related symptoms noted.  Follow-up of diabetes. Patient does check blood sugar at home. Readings run between 110 and 140 faasting. Not checking PP.  Patient denies symptoms such as polyuria, polydipsia, excessive hunger, nausea No significant hypoglycemic spells noted. Medications as noted below. Taking them regularly without complication/adverse reaction being reported today.    History Beth [name] has a past medical history of Anemia; Arthritis; Blood transfusion without reported diagnosis (2012); Cataract; CKD (chronic kidney disease), stage III; CVA (cerebral infarction); Family history of anesthesia complication; Gout; Heart murmur; Herpes infection (08-09-14); Hyperlipidemia; Hypertension; Nocturia; and Stroke ([place]) (2006).   She has a past surgical history that includes Carpal tunnel release (Right, 1983); Abdominal hysterectomy (1983); colonscopy (June 21, 2014); nasal cauterization (2012); Total knee arthroplasty (Right, 07/03/2014); Patellar tendon repair (Right, 08/11/2014); Joint replacement (06/2014); I&D knee with poly  exchange (Right, 10/02/2014); Excisional total knee arthroplasty with antibiotic spacers (Right, 02/16/2015); and Reimplantation of total knee (Right, 05/23/2015).   Her family history includes Diabetes in her son and son; Heart disease in her brother; Hypertension in her father; Kidney disease in her daughter; Ovarian cancer in her mother; Peripheral vascular disease in her father.She reports that she has never smoked. She has never used smokeless tobacco. She reports that she does not drink alcohol or use drugs.  Current Outpatient Prescriptions on File Prior to Visit  Medication Sig Dispense Refill  . acetaminophen (TYLENOL) 500 MG tablet Take 1,000 mg by mouth every 6 (six) hours as needed for mild pain.    Marland Kitchen amLODipine (NORVASC) 10 MG tablet Take 1 tablet (10 mg total) by mouth every evening. 90 tablet 1  . atorvastatin (LIPITOR) 20 MG tablet Take 1 tablet (20 mg total) by mouth every evening. 90 tablet 1  . metFORMIN (GLUCOPHAGE-XR) 500 MG 24 hr tablet Take 1 tablet (500 mg total) by mouth 2 (two) times daily. 180 tablet 3  . Nebivolol HCl (BYSTOLIC) 20 MG TABS Take 1 tablet (20 mg total) by mouth every evening. For blood pressure 90 tablet 1  . ferrous fumarate (HEMOCYTE - 106 MG FE) 325 (106 Fe) MG TABS tablet Take 1 tablet (106 mg of iron total) by mouth 2 (two) times daily. For anemia / iron (Patient not taking: Reported on 08/27/2016) 60 each 5   Current Facility-Administered Medications on File Prior to Visit  Medication Dose Route Frequency Provider Last Rate Last Dose  . tranexamic acid (CYKLOKAPRON) 2,000 mg in sodium chloride 0.9 % 50 mL Topical Application  8,144 mg Topical Once [name], MD        ROS Review of Systems  Constitutional: Negative for activity change, appetite  change and fever.  HENT: Negative for congestion, rhinorrhea and sore throat.   Eyes: Negative for visual disturbance.  Respiratory: Negative for cough and shortness of breath.   Cardiovascular:  Negative for chest pain and palpitations.  Gastrointestinal: Negative for abdominal pain, diarrhea and nausea.  Genitourinary: Negative for dysuria.  Musculoskeletal: Negative for arthralgias and myalgias.    Objective:  BP 138/86   Pulse 66   Temp 98.1 F (36.7 C) (Oral)   Ht 5' 5"  (1.651 m)   Wt 194 lb 6 oz (88.2 kg)   BMI 32.35 kg/m   BP Readings from Last 3 Encounters:  08/27/16 138/86  03/07/16 (!) 147/84  03/01/16 (!) 148/78    Wt Readings from Last 3 Encounters:  08/27/16 194 lb 6 oz (88.2 kg)  03/07/16 187 lb (84.8 kg)  03/01/16 195 lb 12.3 oz (88.8 kg)     Physical Exam  Constitutional: She is oriented to person, place, and time. She appears well-developed and well-nourished. No distress.  HENT:  Head: Normocephalic and atraumatic.  Right Ear: External ear normal.  Left Ear: External ear normal.  Nose: Nose normal.  Mouth/Throat: Oropharynx is clear and moist.  Eyes: Conjunctivae and EOM are normal. Pupils are equal, round, and reactive to light.  Neck: Normal range of motion. Neck supple. No thyromegaly present.  Cardiovascular: Normal rate, regular rhythm and normal heart sounds.   No murmur heard. Pulmonary/Chest: Effort normal and breath sounds normal. No respiratory distress. She has no wheezes. She has no rales.  Abdominal: Soft. Bowel sounds are normal. She exhibits no distension. There is no tenderness.  Lymphadenopathy:    She has no cervical adenopathy.  Neurological: She is alert and oriented to person, place, and time. She has normal reflexes.  Skin: Skin is warm and dry.  Psychiatric: She has a normal mood and affect. Her behavior is normal. Judgment and thought content normal.    Lab Results  Component Value Date   HGBA1C 6.6 (H) 02/29/2016   HGBA1C 6.3 11/19/2015   HGBA1C 6.3 08/10/2015      Ct Head Wo Contrast  Result Date: 02/28/2016 CLINICAL DATA:  Left side weakness. Loss of left hand grip strength with onset 2 days ago. EXAM:  CT HEAD WITHOUT CONTRAST TECHNIQUE: Contiguous axial images were obtained from the base of the skull through the vertex without intravenous contrast. COMPARISON:  None available FINDINGS: Moderate diffuse atrophy. Low attenuation in the deep white matter. More prominent low attenuation in the region of the right basal ganglia does and right thalamus focally. No hemorrhage or extra-axial fluid. No hydrocephalus. Calvarium intact. Extensive atherosclerotic calcification of the intracranial vessels. IMPRESSION: Chronic involutional change with smaller foci of low attenuation right basal ganglia and right thalamus. Subacute lacunar infarcts suspected. Further evaluation with MRI is recommended. Electronically Signed   By: Skipper Cliche M.D.   On: 02/28/2016 18:20   Mr Angiogram Head Wo Contrast  Result Date: 02/28/2016 CLINICAL DATA:  Stroke. Left-sided weakness. Hypertension and hyperlipidemia. Chronic kidney disease. EXAM: MRI HEAD WITHOUT CONTRAST MRA HEAD WITHOUT CONTRAST TECHNIQUE: Multiplanar, multiecho pulse sequences of the brain and surrounding structures were obtained without intravenous contrast. Angiographic images of the head were obtained using MRA technique without contrast. COMPARISON:  CT head 02/28/2016 FINDINGS: MRI HEAD FINDINGS Negative for acute infarct. Moderate atrophy. Advanced chronic ischemic changes. Chronic microvascular ischemic change throughout the white matter and basal ganglia. Chronic infarcts in the thalamus bilaterally. Chronic ischemia in the pons left greater than right. Numerous  areas of chronic micro hemorrhage in the brain. These are present in the cerebral hemispheres, basal ganglia, and cerebellum bilaterally. Findings suggest poorly controlled hypertension versus cerebral amyloid. Negative for mass lesion.  No shift of the midline structures. Pituitary normal in size. Normal skullbase. Mild mucosal edema in the paranasal sinuses. No orbital mass. MRA HEAD FINDINGS Both  vertebral arteries patent to the basilar. Left PICA patent. Right PICA not visualized. Basilar widely patent. Superior cerebellar and posterior cerebral arteries patent without significant stenosis. Fetal origin right posterior cerebral artery. Internal carotid artery widely patent without significant stenosis. Anterior and middle cerebral arteries patent bilaterally without significant stenosis Negative for cerebral aneurysm. IMPRESSION: Negative for acute infarct Advanced chronic microvascular ischemia. Advance multifocal chronic microhemorrhage most consistent with poorly controlled hypertension Negative MRA head Electronically Signed   By: Franchot Gallo M.D.   On: 02/28/2016 21:02   Mr Brain Wo Contrast  Result Date: 02/28/2016 CLINICAL DATA:  Stroke. Left-sided weakness. Hypertension and hyperlipidemia. Chronic kidney disease. EXAM: MRI HEAD WITHOUT CONTRAST MRA HEAD WITHOUT CONTRAST TECHNIQUE: Multiplanar, multiecho pulse sequences of the brain and surrounding structures were obtained without intravenous contrast. Angiographic images of the head were obtained using MRA technique without contrast. COMPARISON:  CT head 02/28/2016 FINDINGS: MRI HEAD FINDINGS Negative for acute infarct. Moderate atrophy. Advanced chronic ischemic changes. Chronic microvascular ischemic change throughout the white matter and basal ganglia. Chronic infarcts in the thalamus bilaterally. Chronic ischemia in the pons left greater than right. Numerous areas of chronic micro hemorrhage in the brain. These are present in the cerebral hemispheres, basal ganglia, and cerebellum bilaterally. Findings suggest poorly controlled hypertension versus cerebral amyloid. Negative for mass lesion.  No shift of the midline structures. Pituitary normal in size. Normal skullbase. Mild mucosal edema in the paranasal sinuses. No orbital mass. MRA HEAD FINDINGS Both vertebral arteries patent to the basilar. Left PICA patent. Right PICA not visualized.  Basilar widely patent. Superior cerebellar and posterior cerebral arteries patent without significant stenosis. Fetal origin right posterior cerebral artery. Internal carotid artery widely patent without significant stenosis. Anterior and middle cerebral arteries patent bilaterally without significant stenosis Negative for cerebral aneurysm. IMPRESSION: Negative for acute infarct Advanced chronic microvascular ischemia. Advance multifocal chronic microhemorrhage most consistent with poorly controlled hypertension Negative MRA head Electronically Signed   By: Franchot Gallo M.D.   On: 02/28/2016 21:02   Mr Cervical Spine W Wo Contrast  Result Date: 02/29/2016 CLINICAL DATA:  Left arm pain and weakness EXAM: MRI CERVICAL SPINE WITHOUT AND WITH CONTRAST TECHNIQUE: Multiplanar and multiecho pulse sequences of the cervical spine, to include the craniocervical junction and cervicothoracic junction, were obtained according to standard protocol without and with intravenous contrast. CONTRAST:  20 mL MultiHance IV COMPARISON:  Cervical MRI 04/23/2010 FINDINGS: Image quality degraded by motion. Normal cervical alignment. Negative for fracture or mass. Negative for cord compression. Postcontrast imaging reveals no enhancing mass lesion. C2-3:  Negative C3-4: Mild uncinate spurring bilaterally. Mild facet hypertrophy. Mild foraminal narrowing bilaterally. C4-5: Disc degeneration with diffuse uncinate spurring. Moderate right foraminal encroachment. Mild left foraminal encroachment C5-6: Disc degeneration with diffuse uncinate spurring right greater than left. Moderate right foraminal encroachment and mild left foraminal encroachment. Mild spinal stenosis. C6-7: Disc degeneration and spondylosis. Mild foraminal narrowing bilaterally. C7-T1: Disc degeneration and spondylosis. Mild foraminal narrowing bilaterally. Spondylosis in the upper thoracic spine IMPRESSION: Negative for fracture or mass.  No cord compression Multilevel  disc degeneration and spurring, asymmetric on the right at C4-5 and C5-6 causing  right foraminal encroachment. See above description. Electronically Signed   By: Franchot Gallo M.D.   On: 02/29/2016 13:44    Assessment & Plan:   Genna was seen today for diabetes.  Diagnoses and all orders for this visit:  Controlled type 2 diabetes mellitus without complication, without long-term current use of insulin (HCC) -     Bayer DCA Hb A1c Waived -     CBC with Differential/Platelet -     CMP14+EGFR -     Microalbumin / creatinine urine ratio  Essential hypertension  HLD (hyperlipidemia)  Chronic gout without tophus, unspecified cause, unspecified site -     Uric acid  Other orders -     olmesartan-hydrochlorothiazide (BENICAR HCT) 40-12.5 MG tablet; Take 1 tablet by mouth daily. In the morning   I have discontinued Ms. Barnwell's oxyCODONE, solifenacin, hydrALAZINE, traMADol, colchicine, and olmesartan. I am also having her start on olmesartan-hydrochlorothiazide. Additionally, I am having her maintain her acetaminophen, ferrous fumarate, metFORMIN, amLODipine, atorvastatin, and Nebivolol HCl.  Meds ordered this encounter  Medications  . olmesartan-hydrochlorothiazide (BENICAR HCT) 40-12.5 MG tablet    Sig: Take 1 tablet by mouth daily. In the morning    Dispense:  30 tablet    Refill:  5     Follow-up: Return in about 3 months (around 11/26/2016).  Claretta Fraise, M.D.

## 2016-08-28 LAB — CBC WITH DIFFERENTIAL/PLATELET
BASOS ABS: 0 10*3/uL (ref 0.0–0.2)
Basos: 0 %
EOS (ABSOLUTE): 0.1 10*3/uL (ref 0.0–0.4)
EOS: 2 %
Hematocrit: 36.6 % (ref 34.0–46.6)
Hemoglobin: 11.8 g/dL (ref 11.1–15.9)
Immature Grans (Abs): 0 10*3/uL (ref 0.0–0.1)
Immature Granulocytes: 0 %
LYMPHS ABS: 2.2 10*3/uL (ref 0.7–3.1)
Lymphs: 29 %
MCH: 25.5 pg — AB (ref 26.6–33.0)
MCHC: 32.2 g/dL (ref 31.5–35.7)
MCV: 79 fL (ref 79–97)
MONOCYTES: 8 %
Monocytes Absolute: 0.6 10*3/uL (ref 0.1–0.9)
NEUTROS PCT: 61 %
Neutrophils Absolute: 4.7 10*3/uL (ref 1.4–7.0)
PLATELETS: 388 10*3/uL — AB (ref 150–379)
RBC: 4.63 x10E6/uL (ref 3.77–5.28)
RDW: 15.5 % — AB (ref 12.3–15.4)
WBC: 7.7 10*3/uL (ref 3.4–10.8)

## 2016-08-28 LAB — CMP14+EGFR
A/G RATIO: 1.1 — AB (ref 1.2–2.2)
ALK PHOS: 117 IU/L (ref 39–117)
ALT: 7 IU/L (ref 0–32)
AST: 15 IU/L (ref 0–40)
Albumin: 4.1 g/dL (ref 3.5–4.8)
BILIRUBIN TOTAL: 0.8 mg/dL (ref 0.0–1.2)
BUN/Creatinine Ratio: 22 (ref 12–28)
BUN: 22 mg/dL (ref 8–27)
CHLORIDE: 98 mmol/L (ref 96–106)
CO2: 27 mmol/L (ref 18–29)
Calcium: 10 mg/dL (ref 8.7–10.3)
Creatinine, Ser: 1 mg/dL (ref 0.57–1.00)
GFR calc Af Amer: 65 mL/min/{1.73_m2} (ref >59)
GFR calc non Af Amer: 56 mL/min/{1.73_m2} — ABNORMAL LOW (ref >59)
GLUCOSE: 98 mg/dL (ref 65–99)
Globulin, Total: 3.6 g/dL (ref 1.5–4.5)
POTASSIUM: 4.8 mmol/L (ref 3.5–5.2)
Sodium: 139 mmol/L (ref 134–144)
Total Protein: 7.7 g/dL (ref 6.0–8.5)

## 2016-08-28 LAB — MICROALBUMIN / CREATININE URINE RATIO
CREATININE, UR: 234.9 mg/dL
MICROALB/CREAT RATIO: 388.2 mg/g{creat} — AB (ref 0.0–30.0)
MICROALBUM., U, RANDOM: 911.9 ug/mL

## 2016-08-28 LAB — URIC ACID: Uric Acid: 6.8 mg/dL (ref 2.5–7.1)

## 2016-08-29 ENCOUNTER — Other Ambulatory Visit: Payer: Self-pay | Admitting: Family Medicine

## 2016-09-03 ENCOUNTER — Other Ambulatory Visit: Payer: Self-pay | Admitting: Family

## 2016-09-03 ENCOUNTER — Telehealth: Payer: Self-pay | Admitting: Family Medicine

## 2016-09-03 NOTE — Telephone Encounter (Signed)
Patient aware of lab results.

## 2016-09-04 DIAGNOSIS — Z96651 Presence of right artificial knee joint: Secondary | ICD-10-CM | POA: Diagnosis not present

## 2016-09-04 DIAGNOSIS — M1712 Unilateral primary osteoarthritis, left knee: Secondary | ICD-10-CM | POA: Diagnosis not present

## 2016-09-04 DIAGNOSIS — Z471 Aftercare following joint replacement surgery: Secondary | ICD-10-CM | POA: Diagnosis not present

## 2016-09-04 NOTE — Telephone Encounter (Signed)
Patient states that she only takes Metformin daily but in her med list it has take BID please advise

## 2016-09-05 DIAGNOSIS — G5632 Lesion of radial nerve, left upper limb: Secondary | ICD-10-CM | POA: Diagnosis not present

## 2016-09-05 MED ORDER — ATORVASTATIN CALCIUM 20 MG PO TABS: 20.0000 mg | ORAL_TABLET | Freq: Every evening | ORAL | 1 refills | Status: DC

## 2016-09-05 MED ORDER — OLMESARTAN MEDOXOMIL-HCTZ 40-12.5 MG PO TABS: 1.0000 | ORAL_TABLET | Freq: Every day | ORAL | 3 refills | Status: DC

## 2016-09-05 MED ORDER — AMLODIPINE BESYLATE 10 MG PO TABS: 10.0000 mg | ORAL_TABLET | Freq: Every evening | ORAL | 1 refills | Status: DC

## 2016-09-07 ENCOUNTER — Other Ambulatory Visit: Payer: Self-pay | Admitting: Family

## 2016-09-07 ENCOUNTER — Other Ambulatory Visit: Payer: Self-pay | Admitting: Family Medicine

## 2016-10-01 DIAGNOSIS — Z96651 Presence of right artificial knee joint: Secondary | ICD-10-CM | POA: Diagnosis not present

## 2016-10-01 DIAGNOSIS — Z471 Aftercare following joint replacement surgery: Secondary | ICD-10-CM | POA: Diagnosis not present

## 2016-10-07 DIAGNOSIS — Z471 Aftercare following joint replacement surgery: Secondary | ICD-10-CM | POA: Diagnosis not present

## 2016-10-07 DIAGNOSIS — Z96651 Presence of right artificial knee joint: Secondary | ICD-10-CM | POA: Diagnosis not present

## 2016-10-15 DIAGNOSIS — L02415 Cutaneous abscess of right lower limb: Secondary | ICD-10-CM | POA: Diagnosis not present

## 2016-10-15 DIAGNOSIS — T814XXD Infection following a procedure, subsequent encounter: Secondary | ICD-10-CM | POA: Diagnosis not present

## 2016-10-15 DIAGNOSIS — Z96651 Presence of right artificial knee joint: Secondary | ICD-10-CM | POA: Diagnosis not present

## 2016-10-15 DIAGNOSIS — Z471 Aftercare following joint replacement surgery: Secondary | ICD-10-CM | POA: Diagnosis not present

## 2016-10-22 DIAGNOSIS — L02415 Cutaneous abscess of right lower limb: Secondary | ICD-10-CM | POA: Diagnosis not present

## 2016-10-22 DIAGNOSIS — Z96651 Presence of right artificial knee joint: Secondary | ICD-10-CM | POA: Diagnosis not present

## 2016-10-22 DIAGNOSIS — Z471 Aftercare following joint replacement surgery: Secondary | ICD-10-CM | POA: Diagnosis not present

## 2016-11-04 DIAGNOSIS — H3540 Unspecified peripheral retinal degeneration: Secondary | ICD-10-CM | POA: Diagnosis not present

## 2016-11-04 DIAGNOSIS — H3589 Other specified retinal disorders: Secondary | ICD-10-CM | POA: Diagnosis not present

## 2016-11-04 DIAGNOSIS — E119 Type 2 diabetes mellitus without complications: Secondary | ICD-10-CM | POA: Diagnosis not present

## 2016-11-04 DIAGNOSIS — Z7984 Long term (current) use of oral hypoglycemic drugs: Secondary | ICD-10-CM | POA: Diagnosis not present

## 2016-11-04 LAB — HM DIABETES EYE EXAM

## 2016-11-13 ENCOUNTER — Other Ambulatory Visit: Payer: Self-pay | Admitting: Family Medicine

## 2016-11-19 ENCOUNTER — Telehealth: Payer: Self-pay | Admitting: Family Medicine

## 2016-11-19 ENCOUNTER — Other Ambulatory Visit: Payer: Self-pay

## 2016-11-19 MED ORDER — METFORMIN HCL ER 500 MG PO TB24: ORAL_TABLET | ORAL | 0 refills | Status: DC

## 2016-11-19 NOTE — Telephone Encounter (Signed)
Resent to [facility] 

## 2016-11-26 ENCOUNTER — Ambulatory Visit: Payer: Medicare Other | Admitting: Pharmacist

## 2016-12-16 ENCOUNTER — Ambulatory Visit (INDEPENDENT_AMBULATORY_CARE_PROVIDER_SITE_OTHER): Payer: Medicare Other | Admitting: Pharmacist

## 2016-12-16 ENCOUNTER — Encounter: Payer: Self-pay | Admitting: Pharmacist

## 2016-12-16 ENCOUNTER — Other Ambulatory Visit: Payer: Medicare Other

## 2016-12-16 VITALS — BP 142/90 | HR 84 | Ht 65.0 in | Wt 186.0 lb

## 2016-12-16 DIAGNOSIS — Z1211 Encounter for screening for malignant neoplasm of colon: Secondary | ICD-10-CM | POA: Diagnosis not present

## 2016-12-16 DIAGNOSIS — Z0000 Encounter for general adult medical examination without abnormal findings: Secondary | ICD-10-CM | POA: Diagnosis not present

## 2016-12-16 NOTE — Progress Notes (Signed)
Patient ID: Beth [name], female   DOB: September 03, 1946, 71 y.o.   MRN: [id number]    Subjective:   Beth [name] is a 71 y.o. female who presents for a subsequent Medicare Annual Wellness Visit.  Beth [name] is widowed and retired.  She lives alone.  She has great support from her family and her adult granddaughter is present with her today.    Beth [name]'s biggest concern is knee pain.  She see Dr Elmyra Ricks.  She had a knee replacement with complications which results in removal of hardware, placement of spacer and eventually replacement of hardware.  She has a follow up with Dr Elmyra Ricks in 2 weeks.   Beth [name] also reports neck pain which started about 2 weeks ago.  She believes this pain was related to old pillows with poor support.  She has been using heat and ice off and on and also Salonpas patches.  She has new pillows and pain is improving.  Current Medications (verified) Outpatient Encounter Prescriptions as of 12/16/2016  Medication Sig  . acetaminophen (TYLENOL) 500 MG tablet Take 1,000 mg by mouth every 6 (six) hours as needed for mild pain.  Marland Kitchen amLODipine (NORVASC) 10 MG tablet Take 1 tablet (10 mg total) by mouth every evening.  Marland Kitchen atorvastatin (LIPITOR) 20 MG tablet Take 1 tablet (20 mg total) by mouth every evening.  . ferrous sulfate 325 (65 FE) MG tablet Take 325 mg by mouth every other day.  . metFORMIN (GLUCOPHAGE-XR) 500 MG 24 hr tablet TAKE ONE TABLET BY MOUTH ONCE DAILY WITH BREAKFAST  . Nebivolol HCl (BYSTOLIC) 20 MG TABS Take 1 tablet (20 mg total) by mouth every evening. For blood pressure  . olmesartan-hydrochlorothiazide (BENICAR HCT) 40-12.5 MG tablet Take 1 tablet by mouth daily. In the morning  . ONE TOUCH ULTRA TEST test strip   . [DISCONTINUED] ferrous fumarate (HEMOCYTE - 106 MG FE) 325 (106 Fe) MG TABS tablet Take 1 tablet (106 mg of iron total) by mouth 2 (two) times daily. For anemia / iron (Patient not taking: Reported on 12/16/2016)   Facility-Administered Encounter  Medications as of 12/16/2016  Medication  . tranexamic acid (CYKLOKAPRON) 2,000 mg in sodium chloride 0.9 % 50 mL Topical Application    Allergies (verified) Aleve [naproxen sodium]; Asa [aspirin]; Clonidine derivatives; and Shellfish allergy   History: Past Medical History:  Diagnosis Date  . Anemia   . Arthritis    Knee both knees  . Blood transfusion without reported diagnosis 2012   anemia;pt denies transfusion stated was only on iron tablet  . Cataract    left  . CKD (chronic kidney disease), stage III   . CVA (cerebral infarction)    2006  . Family history of anesthesia complication    sister very slow to awaken after anesthesia;severe vomiting   . Gout    left elbow  . Heart murmur   . 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 08-09-14 Right eye  . Hyperlipidemia   . Hypertension   . Nocturia    3-4 times per night  . Stroke [facility]) 2006   x 1 no deficits noted    Past Surgical History:  Procedure Laterality Date  . ABDOMINAL HYSTERECTOMY  1983  . CARPAL TUNNEL RELEASE Right 1983  . colonscopy  June 21, 2014  . EXCISIONAL TOTAL KNEE ARTHROPLASTY WITH ANTIBIOTIC SPACERS Right 02/16/2015   Procedure: RIGHT KNEE RESECTION ARTHROPLASTY WITH ANTIBIOTIC SPACERS;  Surgeon: [name], MD;  Location: [facility];  Service: Orthopedics;  Laterality: Right;  . I&D KNEE WITH POLY EXCHANGE Right 10/02/2014   Procedure: IRRIGATION AND DEBRIDEMENT RIGHT KNEE WITH POLY EXCHANGE;  Surgeon: Gearlean Alf, MD;  Location: [facility];  Service: Orthopedics;  Laterality: Right;  . JOINT REPLACEMENT  06/2014   right knee  . nasal cauterization  2012  . PATELLAR TENDON REPAIR Right 08/11/2014   Procedure: RIGHT PATELLA TENDON REPAIR;  Surgeon: Gearlean Alf, MD;  Location: [facility];  Service: Orthopedics;  Laterality: Right;  . REIMPLANTATION OF TOTAL KNEE Right 05/23/2015   Procedure: RIGHT KNEE ARTHROPLASTY REIMPLANTATION;  Surgeon: [name], MD;  Location: [facility];  Service:  Orthopedics;  Laterality: Right;  . TOTAL KNEE ARTHROPLASTY Right 07/03/2014   Procedure: RIGHT TOTAL KNEE ARTHROPLASTY;  Surgeon: Gearlean Alf, MD;  Location: [facility];  Service: Orthopedics;  Laterality: Right;   Family History  Problem Relation Age of Onset  . Ovarian cancer Mother   . Cancer Mother   . Diabetes Son   . Diabetes Son   . Peripheral vascular disease Father     with amputation of both legs  . Hypertension Father   . Heart disease Brother   . Kidney disease Daughter   . Colon cancer Neg Hx   . Esophageal cancer Neg Hx   . Stomach cancer Neg Hx   . Rectal cancer Neg Hx    Social History   Occupational History  .  Retired   Social History Main Topics  . Smoking status: Never Smoker  . Smokeless tobacco: Never Used  . Alcohol use No  . Drug use: No  . Sexual activity: No    Do you feel safe at home?  Yes Are there smokers in your home (other than you)? No  Dietary issues and exercise activities: Current Exercise Habits: The patient does not participate in regular exercise at present, Exercise limited by: orthopedic condition(s)  Current Dietary habits:  Tries to limit bread and other foods high in carbs.  Limits fried foods.  Eats lots of fruits and vegetables.  Limits salt intake  Objective:    Today's Vitals   12/16/16 1609 12/16/16 1618  BP: (!) 144/90 (!) 142/90  Pulse: 84   Weight: 186 lb (84.4 kg)   Height: 5\' 5"  (1.651 m)   PainSc: 2    PainLoc: Neck    Body mass index is 30.95 kg/m.  Activities of Daily Living In your present state of health, do you have any difficulty performing the following activities: 12/16/2016 02/28/2016  Hearing? N N  Vision? N N  Difficulty concentrating or making decisions? N N  Walking or climbing stairs? Y Y  Dressing or bathing? N N  Doing errands, shopping? Y N  Preparing Food and eating ? N -  Using the Toilet? N -  In the past six months, have you accidently leaked urine? N -  Do you have problems  with loss of bowel control? N -  Managing your Medications? N -  Managing your Finances? N -  Housekeeping or managing your Housekeeping? N -  Some recent data might be hidden     Cardiac Risk Factors include: advanced age (>57men, >17 women);dyslipidemia;hypertension;obesity (BMI >30kg/m2);sedentary lifestyle  Depression Screen PHQ 2/9 Scores 12/16/2016 08/27/2016 03/07/2016 11/19/2015  PHQ - 2 Score 0 0 0 0     Fall Risk Fall Risk  12/16/2016 08/27/2016 03/07/2016 11/19/2015 08/10/2015  Falls in the past year? No No No No Yes  Number falls  in past yr: - - - - 1  Injury with Fall? - - - - Yes  Risk Factor Category  - - - - High Fall Risk  Risk for fall due to : - - - - -  Follow up - - - - Falls prevention discussed    Cognitive Function: MMSE - Mini Mental State Exam 12/16/2016 08/10/2015  Orientation to time 5 5  Orientation to Place 5 5  Registration 3 3  Attention/ Calculation 5 5  Recall 3 3  Language- name 2 objects 2 2  Language- repeat 1 1  Language- follow 3 step command 3 3  Language- read & follow direction 1 1  Write a sentence 1 1  Copy design 0 1  Total score 29 30    Immunizations and Health Maintenance Immunization History  Administered Date(s) Administered  . Influenza, High Dose Seasonal PF 08/27/2016  . Influenza,inj,Quad PF,36+ Mos 11/19/2015   Health Maintenance Due  Topic Date Due  . Hepatitis C Screening  24-Aug-1946  . FOOT EXAM  12/24/1953  . TETANUS/TDAP  12/24/1962    Patient Care Team: Claretta Fraise, MD as PCP - General (Family Medicine)  Indicate any recent Medical Services you may have received from other than [facility] providers in the past year (date may be approximate).    Assessment:    Annual Wellness Visit  Knee pain, chronic - seeing ortho Neck pain, acute - improving   Screening Tests Health Maintenance  Topic Date Due  . Hepatitis C Screening  1946/10/12  . FOOT EXAM  12/24/1953  . TETANUS/TDAP  12/24/1962  . PNA vac Low Risk  Adult (1 of 2 - PCV13) 02/06/2017 (Originally 12/24/2008)  . ZOSTAVAX  12/10/2017 (Originally 12/25/2003)  . HEMOGLOBIN A1C  02/24/2017  . OPHTHALMOLOGY EXAM  11/04/2017  . MAMMOGRAM  12/23/2017  . DEXA SCAN  10/09/2020  . COLONOSCOPY  06/21/2024  . INFLUENZA VACCINE  Completed        Plan:   During the course of the visit Beth [name] was educated and counseled about the following appropriate screening and preventive services:   Vaccines to include Pneumoccal, Influenza, Td, Zostavax - patient declined pneumonia, Tdap and Zostavax.  UTD on influenza vaccine  Colorectal cancer screening - brought in FOBT today - results pending  Cardiovascular disease screening - UTD - ECHO and EKG 02/2016  Diabetes screening - last A1c was 6.3%  Bone Denisty / Osteoporosis Screening - UTD - next due 10/2017  Mammogram - due in 2 weeks - at [facility] - patient to call for appt.  Foot exam - sees Dr Irving Shows  Glaucoma screening / Diabetic Eye Exam - UTD  Nutrition counseling - Discussed DASH eating plan  Advanced Directives - packet given and discussed  Follow up appt with PCP made today for 02/2017    Patient Instructions (the written plan) were given to the patient.   Cherre Robins, PharmD   12/17/2016

## 2016-12-16 NOTE — Patient Instructions (Addendum)
Beth [name] , Thank you for taking time to come for your Medicare Wellness Visit. I appreciate your ongoing commitment to your health goals. Please review the following plan we discussed and let me know if I can assist you in the future.   These are the goals we discussed:  Remember to call [facility] to schedule mammogram (last was 12/25/2015)   Continue to stay active and exercise as much as you are able   This is a list of the screening recommended for you and due dates:  Health Maintenance  Topic Date Due  .  Hepatitis C: One time screening is recommended by Center for Disease Control  (CDC) for  adults born from [birth year] through 1967.   09-18-1946  . Complete foot exam   12/24/1953  . Tetanus Vaccine  12/24/1962 - due now; cost is $8.35  . Shingles Vaccine  12/25/2003  . Pneumonia vaccines (1 of 2 - PCV13) 12/24/2008  . Hemoglobin A1C  02/24/2017  . Eye exam for diabetics  11/04/2017  . Mammogram  12/23/2017  . Colon Cancer Screening  06/21/2024  . Flu Shot  Completed  . DEXA scan (bone density measurement)  2020   DASH Eating Plan DASH stands for "Dietary Approaches to Stop Hypertension." The DASH eating plan is a healthy eating plan that has been shown to reduce high blood pressure (hypertension). Additional health benefits may include reducing the risk of type 2 diabetes mellitus, heart disease, and stroke. The DASH eating plan may also help with weight loss. What do I need to know about the DASH eating plan? For the DASH eating plan, you will follow these general guidelines:  Choose foods with less than 150 milligrams of sodium per serving (as listed on the food label).  Use salt-free seasonings or herbs instead of table salt or sea salt.  Check with your health care provider or pharmacist before using salt substitutes.  Eat lower-sodium products. These are often labeled as "low-sodium" or "no salt added."  Eat fresh foods. Avoid eating a lot of canned foods.  Eat  more vegetables, fruits, and low-fat dairy products.  Choose whole grains. Look for the word "whole" as the first word in the ingredient list.  Choose fish and skinless chicken or [place] more often than red meat. Limit fish, poultry, and meat to 6 oz (170 g) each day.  Limit sweets, desserts, sugars, and sugary drinks.  Choose heart-healthy fats.  Eat more home-cooked food and less restaurant, buffet, and fast food.  Limit fried foods.  Do not fry foods. Cook foods using methods such as baking, boiling, grilling, and broiling instead.  When eating at a restaurant, ask that your food be prepared with less salt, or no salt if possible. What foods can I eat? Seek help from a dietitian for individual calorie needs. Grains  Whole grain or whole wheat bread. Brown rice. Whole grain or whole wheat pasta. Quinoa, bulgur, and whole grain cereals. Low-sodium cereals. Corn or whole wheat flour tortillas. Whole grain cornbread. Whole grain crackers. Low-sodium crackers. Vegetables  Fresh or frozen vegetables (raw, steamed, roasted, or grilled). Low-sodium or reduced-sodium tomato and vegetable juices. Low-sodium or reduced-sodium tomato sauce and paste. Low-sodium or reduced-sodium canned vegetables. Fruits  All fresh, canned (in natural juice), or frozen fruits. Meat and Other Protein Products  Ground beef (85% or leaner), grass-fed beef, or beef trimmed of fat. Skinless chicken or [place]. Ground chicken or [place]. Pork trimmed of fat. All fish and seafood. Eggs.  Dried beans, peas, or lentils. Unsalted nuts and seeds. Unsalted canned beans. Dairy  Low-fat dairy products, such as skim or 1% milk, 2% or reduced-fat cheeses, low-fat ricotta or cottage cheese, or plain low-fat yogurt. Low-sodium or reduced-sodium cheeses. Fats and Oils  Tub margarines without trans fats. Light or reduced-fat mayonnaise and salad dressings (reduced sodium). Avocado. Safflower, olive, or canola oils. Natural peanut or  almond butter. Other  Unsalted popcorn and pretzels. The items listed above may not be a complete list of recommended foods or beverages. Contact your dietitian for more options.  What foods are not recommended? Grains  White bread. White pasta. White rice. Refined cornbread. Bagels and croissants. Crackers that contain trans fat. Vegetables  Creamed or fried vegetables. Vegetables in a cheese sauce. Regular canned vegetables. Regular canned tomato sauce and paste. Regular tomato and vegetable juices. Fruits  Canned fruit in light or heavy syrup. Fruit juice. Meat and Other Protein Products  Fatty cuts of meat. Ribs, chicken wings, bacon, sausage, bologna, salami, chitterlings, fatback, hot dogs, bratwurst, and packaged luncheon meats. Salted nuts and seeds. Canned beans with salt. Dairy  Whole or 2% milk, cream, half-and-half, and cream cheese. Whole-fat or sweetened yogurt. Full-fat cheeses or blue cheese. Nondairy creamers and whipped toppings. Processed cheese, cheese spreads, or cheese curds. Condiments  Onion and garlic salt, seasoned salt, table salt, and sea salt. Canned and packaged gravies. Worcestershire sauce. Tartar sauce. Barbecue sauce. Teriyaki sauce. Soy sauce, including reduced sodium. Steak sauce. Fish sauce. Oyster sauce. Cocktail sauce. Horseradish. Ketchup and mustard. Meat flavorings and tenderizers. Bouillon cubes. Hot sauce. Tabasco sauce. Marinades. Taco seasonings. Relishes. Fats and Oils  Butter, stick margarine, lard, shortening, ghee, and bacon fat. Coconut, palm kernel, or palm oils. Regular salad dressings. Other  Pickles and olives. Salted popcorn and pretzels. The items listed above may not be a complete list of foods and beverages to avoid. Contact your dietitian for more information.  Where can I find more information? National Heart, Lung, and Blood Institute: travelstabloid.com This information is not intended to replace  advice given to you by your health care provider. Make sure you discuss any questions you have with your health care provider. Document Released: 11/13/2011 Document Revised: 05/01/2016 Document Reviewed: 09/28/2013 Elsevier Interactive Patient Education  2017 [employer].

## 2016-12-17 LAB — FECAL OCCULT BLOOD, IMMUNOCHEMICAL: Fecal Occult Bld: NEGATIVE

## 2016-12-23 DIAGNOSIS — L84 Corns and callosities: Secondary | ICD-10-CM | POA: Diagnosis not present

## 2016-12-23 DIAGNOSIS — E1142 Type 2 diabetes mellitus with diabetic polyneuropathy: Secondary | ICD-10-CM | POA: Diagnosis not present

## 2016-12-23 DIAGNOSIS — B351 Tinea unguium: Secondary | ICD-10-CM | POA: Diagnosis not present

## 2017-01-01 DIAGNOSIS — Z471 Aftercare following joint replacement surgery: Secondary | ICD-10-CM | POA: Diagnosis not present

## 2017-01-01 DIAGNOSIS — T814XXD Infection following a procedure, subsequent encounter: Secondary | ICD-10-CM | POA: Diagnosis not present

## 2017-01-01 DIAGNOSIS — Z96651 Presence of right artificial knee joint: Secondary | ICD-10-CM | POA: Diagnosis not present

## 2017-01-06 ENCOUNTER — Ambulatory Visit: Payer: Self-pay | Admitting: Orthopedic Surgery

## 2017-01-06 NOTE — Progress Notes (Signed)
Scheduling pre op- please PLACE SURGICAL ORDERS IN EPIC  THANKS

## 2017-01-08 ENCOUNTER — Encounter (HOSPITAL_COMMUNITY)
Admission: RE | Admit: 2017-01-08 | Discharge: 2017-01-08 | Disposition: A | Discharge status: Home / Self Care | Payer: Medicare Other | Source: Ambulatory Visit | Attending: Orthopedic Surgery | Admitting: Orthopedic Surgery

## 2017-01-08 ENCOUNTER — Encounter (HOSPITAL_COMMUNITY): Payer: Self-pay

## 2017-01-08 DIAGNOSIS — Z01812 Encounter for preprocedural laboratory examination: Secondary | ICD-10-CM | POA: Insufficient documentation

## 2017-01-08 LAB — CBC
HEMATOCRIT: 38.7 % (ref 36.0–46.0)
HEMOGLOBIN: 12.1 g/dL (ref 12.0–15.0)
MCH: 24.9 pg — AB (ref 26.0–34.0)
MCHC: 31.3 g/dL (ref 30.0–36.0)
MCV: 79.6 fL (ref 78.0–100.0)
Platelets: 358 10*3/uL (ref 150–400)
RBC: 4.86 MIL/uL (ref 3.87–5.11)
RDW: 15.6 % — ABNORMAL HIGH (ref 11.5–15.5)
WBC: 7.5 10*3/uL (ref 4.0–10.5)

## 2017-01-08 LAB — BASIC METABOLIC PANEL
ANION GAP: 9 (ref 5–15)
BUN: 34 mg/dL — ABNORMAL HIGH (ref 6–20)
CALCIUM: 9.7 mg/dL (ref 8.9–10.3)
CHLORIDE: 102 mmol/L (ref 101–111)
CO2: 29 mmol/L (ref 22–32)
Creatinine, Ser: 1.01 mg/dL — ABNORMAL HIGH (ref 0.44–1.00)
GFR calc non Af Amer: 54 mL/min — ABNORMAL LOW (ref >60)
GLUCOSE: 117 mg/dL — AB (ref 65–99)
Potassium: 4 mmol/L (ref 3.5–5.1)
Sodium: 140 mmol/L (ref 135–145)

## 2017-01-08 LAB — GLUCOSE, CAPILLARY: Glucose-Capillary: 97 mg/dL (ref 65–99)

## 2017-01-08 NOTE — Patient Instructions (Addendum)
Beth [name]  01/08/2017   Your procedure is scheduled on: Wednesday 01/14/2017  Report to [facility] Main  Entrance take [facility]  elevators to 3rd floor to  [place] at  145 PM.  Call this number if you have problems the morning of surgery [phone]   Remember: ONLY 1 PERSON MAY GO WITH YOU TO SHORT STAY TO GET  READY MORNING OF [place].    Do not eat food  :After Midnight.  MAY HAVE CLEAR LIQUIDS FROM MIDNIGHT UP UNTIL 1045 AM THEN NOTHING  UNTIL AFTER SURGERY!     CLEAR LIQUID DIET   Foods Allowed                                                                     Foods Excluded  Coffee and tea, regular and decaf                             liquids that you cannot  Plain Jell-O in any flavor                                             see through such as: Fruit ices (not with fruit pulp)                                     milk, soups, orange juice  Iced Popsicles                                    All solid food Carbonated beverages, regular and diet                                    Cranberry, grape and apple juices Sports drinks like Gatorade Lightly seasoned clear broth or consume(fat free) Sugar, honey syrup  Sample Menu Breakfast                                Lunch                                     Supper Cranberry juice                    Beef broth                            Chicken broth Jell-O                                     Grape juice  Apple juice Coffee or tea                        Jell-O                                      Popsicle                                                Coffee or tea                        Coffee or tea  _____________________________________________________________________               How to Manage Your Diabetes Before and After Surgery  Why is it important to control my blood sugar before and after surgery? . Improving blood sugar levels before and after  surgery helps healing and can limit problems. . A way of improving blood sugar control is eating a healthy diet by: o  Eating less sugar and carbohydrates o  Increasing activity/exercise o  Talking with your doctor about reaching your blood sugar goals . High blood sugars (greater than 180 mg/dL) can raise your risk of infections and slow your recovery, so you will need to focus on controlling your diabetes during the weeks before surgery. . Make sure that the doctor who takes care of your diabetes knows about your planned surgery including the date and location.  How do I manage my blood sugar before surgery? . Check your blood sugar at least 4 times a day, starting 2 days before surgery, to make sure that the level is not too high or low. o Check your blood sugar the morning of your surgery when you wake up and every 2 hours until you get to the Short Stay unit. . If your blood sugar is less than 70 mg/dL, you will need to treat for low blood sugar: o Do not take insulin. o Treat a low blood sugar (less than 70 mg/dL) with  cup of clear juice (cranberry or apple), 4 glucose tablets, OR glucose gel. o Recheck blood sugar in 15 minutes after treatment (to make sure it is greater than 70 mg/dL). If your blood sugar is not greater than 70 mg/dL on recheck, call [phone] for further instructions. . Report your blood sugar to the short stay nurse when you get to Short Stay.  . If you are admitted to the hospital after surgery: o Your blood sugar will be checked by the staff and you will probably be given insulin after surgery (instead of oral diabetes medicines) to make sure you have good blood sugar levels. o The goal for blood sugar control after surgery is 80-180 mg/dL.   WHAT DO I DO ABOUT MY DIABETES MEDICATION?              Take Metformin the night before surgery as  usual !  . Do not take oral diabetes medicines (pills) the morning of surgery.      Take these medicines the  morning of surgery with A SIP OF WATER: none  You may not have any metal on your body including hair pins and              piercings  Do not wear jewelry, make-up, lotions, powders or perfumes, deodorant             Do not wear nail polish.  Do not shave  48 hours prior to surgery.              Men may shave face and neck.   Do not bring valuables to the hospital. [place].  Contacts, dentures or bridgework may not be worn into surgery.  Leave suitcase in the car. After surgery it may be brought to your room.                Please read over the following fact sheets you were given: _____________________________________________________________________             [facility] - Preparing for Surgery Before surgery, you can play an important role.  Because skin is not sterile, your skin needs to be as free of germs as possible.  You can reduce the number of germs on your skin by washing with CHG (chlorahexidine gluconate) soap before surgery.  CHG is an antiseptic cleaner which kills germs and bonds with the skin to continue killing germs even after washing. Please DO NOT use if you have an allergy to CHG or antibacterial soaps.  If your skin becomes reddened/irritated stop using the CHG and inform your nurse when you arrive at Short Stay. Do not shave (including legs and underarms) for at least 48 hours prior to the first CHG shower.  You may shave your face/neck. Please follow these instructions carefully:  1.  Shower with CHG Soap the night before surgery and the  morning of Surgery.  2.  If you choose to wash your hair, wash your hair first as usual with your  normal  shampoo.  3.  After you shampoo, rinse your hair and body thoroughly to remove the  shampoo.                           4.  Use CHG as you would any other liquid soap.  You can apply chg directly  to the skin and wash                        Gently with a scrungie or clean washcloth.  5.  Apply the CHG Soap to your body ONLY FROM THE NECK DOWN.   Do not use on face/ open                           Wound or open sores. Avoid contact with eyes, ears mouth and genitals (private parts).                       Wash face,  Genitals (private parts) with your normal soap.             6.  Wash thoroughly, paying special attention to the area where your surgery  will be performed.  7.  Thoroughly rinse your body with warm water from the neck down.  8.  DO NOT shower/wash with your normal soap after using and rinsing  off  the CHG Soap.                9.  Pat yourself dry with a clean towel.            10.  Wear clean pajamas.            11.  Place clean sheets on your bed the night of your first shower and do not  sleep with pets. Day of Surgery : Do not apply any lotions/deodorants the morning of surgery.  Please wear clean clothes to the hospital/surgery center.  FAILURE TO FOLLOW THESE INSTRUCTIONS MAY RESULT IN THE CANCELLATION OF YOUR SURGERY PATIENT SIGNATURE_________________________________  NURSE SIGNATURE__________________________________  ________________________________________________________________________   [name]  An incentive spirometer is a tool that can help keep your lungs clear and active. This tool measures how well you are filling your lungs with each breath. Taking long deep breaths may help reverse or decrease the chance of developing breathing (pulmonary) problems (especially infection) following:  A long period of time when you are unable to move or be active. BEFORE THE PROCEDURE   If the spirometer includes an indicator to show your best effort, your nurse or respiratory therapist will set it to a desired goal.  If possible, sit up straight or lean slightly forward. Try not to slouch.  Hold the incentive spirometer in an upright position. INSTRUCTIONS FOR USE  1. Sit on the edge of your bed  if possible, or sit up as far as you can in bed or on a chair. 2. Hold the incentive spirometer in an upright position. 3. Breathe out normally. 4. Place the mouthpiece in your mouth and seal your lips tightly around it. 5. Breathe in slowly and as deeply as possible, raising the piston or the ball toward the top of the column. 6. Hold your breath for 3-5 seconds or for as long as possible. Allow the piston or ball to fall to the bottom of the column. 7. Remove the mouthpiece from your mouth and breathe out normally. 8. Rest for a few seconds and repeat Steps 1 through 7 at least 10 times every 1-2 hours when you are awake. Take your time and take a few normal breaths between deep breaths. 9. The spirometer may include an indicator to show your best effort. Use the indicator as a goal to work toward during each repetition. 10. After each set of 10 deep breaths, practice coughing to be sure your lungs are clear. If you have an incision (the cut made at the time of surgery), support your incision when coughing by placing a pillow or rolled up towels firmly against it. Once you are able to get out of bed, walk around indoors and cough well. You may stop using the incentive spirometer when instructed by your caregiver.  RISKS AND COMPLICATIONS  Take your time so you do not get dizzy or light-headed.  If you are in pain, you may need to take or ask for pain medication before doing incentive spirometry. It is harder to take a deep breath if you are having pain. AFTER USE  Rest and breathe slowly and easily.  It can be helpful to keep track of a log of your progress. Your caregiver can provide you with a simple table to help with this. If you are using the spirometer at home, follow these instructions: [place] IF:   You are having difficultly using the spirometer.  You have trouble using the spirometer  as often as instructed.  Your pain medication is not giving enough relief while  using the spirometer.  You develop fever of 100.5 F (38.1 C) or higher. SEEK IMMEDIATE MEDICAL CARE IF:   You cough up bloody sputum that had not been present before.  You develop fever of 102 F (38.9 C) or greater.  You develop worsening pain at or near the incision site. MAKE SURE YOU:   Understand these instructions.  Will watch your condition.  Will get help right away if you are not doing well or get worse. Document Released: 04/06/2007 Document Revised: 02/16/2012 Document Reviewed: 06/07/2007 [facility] Patient Information 2014 [place], [place].   ________________________________________________________________________

## 2017-01-09 LAB — HEMOGLOBIN A1C
Hgb A1c MFr Bld: 5.9 % — ABNORMAL HIGH (ref 4.8–5.6)
MEAN PLASMA GLUCOSE: 123 mg/dL

## 2017-01-14 ENCOUNTER — Ambulatory Visit (HOSPITAL_COMMUNITY): Payer: Medicare Other | Admitting: Anesthesiology

## 2017-01-14 ENCOUNTER — Encounter (HOSPITAL_COMMUNITY): Payer: Self-pay | Admitting: *Deleted

## 2017-01-14 ENCOUNTER — Observation Stay (HOSPITAL_COMMUNITY)
Admission: RE | Admit: 2017-01-14 | Discharge: 2017-01-15 | Disposition: A | Discharge status: Home / Self Care | Payer: Medicare Other | Source: Ambulatory Visit | Attending: Orthopedic Surgery | Admitting: Orthopedic Surgery

## 2017-01-14 ENCOUNTER — Encounter (HOSPITAL_COMMUNITY)
Admission: RE | Disposition: A | Discharge status: Home / Self Care | Payer: Self-pay | Source: Ambulatory Visit | Attending: Orthopedic Surgery

## 2017-01-14 DIAGNOSIS — E785 Hyperlipidemia, unspecified: Secondary | ICD-10-CM | POA: Insufficient documentation

## 2017-01-14 DIAGNOSIS — E1122 Type 2 diabetes mellitus with diabetic chronic kidney disease: Secondary | ICD-10-CM | POA: Diagnosis not present

## 2017-01-14 DIAGNOSIS — Z96651 Presence of right artificial knee joint: Secondary | ICD-10-CM | POA: Diagnosis not present

## 2017-01-14 DIAGNOSIS — Z7984 Long term (current) use of oral hypoglycemic drugs: Secondary | ICD-10-CM | POA: Insufficient documentation

## 2017-01-14 DIAGNOSIS — N183 Chronic kidney disease, stage 3 (moderate): Secondary | ICD-10-CM | POA: Diagnosis not present

## 2017-01-14 DIAGNOSIS — Z8673 Personal history of transient ischemic attack (TIA), and cerebral infarction without residual deficits: Secondary | ICD-10-CM | POA: Insufficient documentation

## 2017-01-14 DIAGNOSIS — L03115 Cellulitis of right lower limb: Secondary | ICD-10-CM | POA: Diagnosis not present

## 2017-01-14 DIAGNOSIS — L02419 Cutaneous abscess of limb, unspecified: Secondary | ICD-10-CM | POA: Diagnosis present

## 2017-01-14 DIAGNOSIS — I10 Essential (primary) hypertension: Secondary | ICD-10-CM | POA: Diagnosis not present

## 2017-01-14 DIAGNOSIS — L02415 Cutaneous abscess of right lower limb: Secondary | ICD-10-CM | POA: Diagnosis not present

## 2017-01-14 DIAGNOSIS — I129 Hypertensive chronic kidney disease with stage 1 through stage 4 chronic kidney disease, or unspecified chronic kidney disease: Secondary | ICD-10-CM | POA: Insufficient documentation

## 2017-01-14 DIAGNOSIS — Z79899 Other long term (current) drug therapy: Secondary | ICD-10-CM | POA: Insufficient documentation

## 2017-01-14 DIAGNOSIS — E119 Type 2 diabetes mellitus without complications: Secondary | ICD-10-CM | POA: Diagnosis not present

## 2017-01-14 DIAGNOSIS — L03119 Cellulitis of unspecified part of limb: Secondary | ICD-10-CM

## 2017-01-14 HISTORY — PX: INCISION AND DRAINAGE OF WOUND: SHX1803

## 2017-01-14 HISTORY — DX: Type 2 diabetes mellitus without complications: E11.9

## 2017-01-14 LAB — GLUCOSE, CAPILLARY
GLUCOSE-CAPILLARY: 94 mg/dL (ref 65–99)
GLUCOSE-CAPILLARY: 86 mg/dL (ref 65–99)
GLUCOSE-CAPILLARY: 103 mg/dL — AB (ref 65–99)
Glucose-Capillary: 223 mg/dL — ABNORMAL HIGH (ref 65–99)

## 2017-01-14 SURGERY — IRRIGATION AND DEBRIDEMENT WOUND
Anesthesia: General | Site: Leg Lower | Laterality: Right

## 2017-01-14 MED ORDER — METOCLOPRAMIDE HCL 5 MG PO TABS: 5.0000 mg | ORAL_TABLET | Freq: Three times a day (TID) | ORAL | Status: DC | PRN

## 2017-01-14 MED ORDER — MIDAZOLAM HCL 2 MG/2ML IJ SOLN
INTRAMUSCULAR | Status: AC
  Filled 2017-01-14: qty 2

## 2017-01-14 MED ORDER — OXYCODONE HCL 5 MG PO TABS
ORAL_TABLET | ORAL | Status: AC
  Administered 2017-01-14: 5 mg via ORAL
  Filled 2017-01-14: qty 1

## 2017-01-14 MED ORDER — SODIUM CHLORIDE 0.9 % IR SOLN
Status: DC | PRN
  Administered 2017-01-14: 1000 mL

## 2017-01-14 MED ORDER — HYDRALAZINE HCL 20 MG/ML IJ SOLN
INTRAMUSCULAR | Status: AC
  Administered 2017-01-14: 10 mg via INTRAVENOUS
  Filled 2017-01-14: qty 1

## 2017-01-14 MED ORDER — ACETAMINOPHEN 500 MG PO TABS
1000.0000 mg | ORAL_TABLET | Freq: Four times a day (QID) | ORAL | Status: DC
  Administered 2017-01-15 (×3): 1000 mg via ORAL
  Filled 2017-01-14 (×3): qty 2

## 2017-01-14 MED ORDER — CEFAZOLIN SODIUM-DEXTROSE 2-4 GM/100ML-% IV SOLN
INTRAVENOUS | Status: AC
  Filled 2017-01-14: qty 100

## 2017-01-14 MED ORDER — ENOXAPARIN SODIUM 40 MG/0.4ML ~~LOC~~ SOLN
40.0000 mg | SUBCUTANEOUS | Status: DC
  Administered 2017-01-15: 11:00:00 40 mg via SUBCUTANEOUS
  Filled 2017-01-14: qty 0.4

## 2017-01-14 MED ORDER — OLMESARTAN MEDOXOMIL-HCTZ 40-12.5 MG PO TABS: 1.0000 | ORAL_TABLET | Freq: Every day | ORAL | Status: DC

## 2017-01-14 MED ORDER — DEXAMETHASONE SODIUM PHOSPHATE 10 MG/ML IJ SOLN
10.0000 mg | Freq: Once | INTRAMUSCULAR | Status: AC
  Administered 2017-01-14: 10 mg via INTRAVENOUS

## 2017-01-14 MED ORDER — LIDOCAINE 2% (20 MG/ML) 5 ML SYRINGE
INTRAMUSCULAR | Status: DC | PRN
  Administered 2017-01-14: 80 mg via INTRAVENOUS

## 2017-01-14 MED ORDER — ACETAMINOPHEN 10 MG/ML IV SOLN
1000.0000 mg | Freq: Once | INTRAVENOUS | Status: AC
  Administered 2017-01-14: 1000 mg via INTRAVENOUS
  Filled 2017-01-14: qty 100

## 2017-01-14 MED ORDER — HYDROMORPHONE HCL 1 MG/ML IJ SOLN
INTRAMUSCULAR | Status: AC
  Administered 2017-01-14: 0.5 mg via INTRAVENOUS
  Filled 2017-01-14: qty 1

## 2017-01-14 MED ORDER — PROPOFOL 10 MG/ML IV BOLUS
INTRAVENOUS | Status: AC
  Filled 2017-01-14: qty 20

## 2017-01-14 MED ORDER — LACTATED RINGERS IV SOLN
INTRAVENOUS | Status: DC
  Administered 2017-01-14 (×2): via INTRAVENOUS

## 2017-01-14 MED ORDER — ATORVASTATIN CALCIUM 20 MG PO TABS: 20.0000 mg | ORAL_TABLET | Freq: Every evening | ORAL | Status: DC

## 2017-01-14 MED ORDER — METOCLOPRAMIDE HCL 5 MG/ML IJ SOLN
5.0000 mg | Freq: Three times a day (TID) | INTRAMUSCULAR | Status: DC | PRN
  Administered 2017-01-14: 10 mg via INTRAVENOUS
  Filled 2017-01-14: qty 2

## 2017-01-14 MED ORDER — AMLODIPINE BESYLATE 10 MG PO TABS
10.0000 mg | ORAL_TABLET | Freq: Every evening | ORAL | Status: DC
  Administered 2017-01-14: 10 mg via ORAL
  Filled 2017-01-14 (×2): qty 1

## 2017-01-14 MED ORDER — FENTANYL CITRATE (PF) 100 MCG/2ML IJ SOLN
INTRAMUSCULAR | Status: DC | PRN
  Administered 2017-01-14: 100 ug via INTRAVENOUS
  Administered 2017-01-14 (×2): 50 ug via INTRAVENOUS

## 2017-01-14 MED ORDER — METHOCARBAMOL 500 MG PO TABS
500.0000 mg | ORAL_TABLET | Freq: Four times a day (QID) | ORAL | Status: DC | PRN
  Administered 2017-01-15: 01:00:00 500 mg via ORAL
  Filled 2017-01-14: qty 1

## 2017-01-14 MED ORDER — TRAMADOL HCL 50 MG PO TABS: 50.0000 mg | ORAL_TABLET | Freq: Four times a day (QID) | ORAL | Status: DC | PRN

## 2017-01-14 MED ORDER — NEBIVOLOL HCL 10 MG PO TABS
20.0000 mg | ORAL_TABLET | Freq: Every evening | ORAL | Status: DC
  Administered 2017-01-14: 20 mg via ORAL
  Filled 2017-01-14 (×2): qty 2

## 2017-01-14 MED ORDER — LABETALOL HCL 5 MG/ML IV SOLN
INTRAVENOUS | Status: DC | PRN
  Administered 2017-01-14: 2.5 mg via INTRAVENOUS
  Administered 2017-01-14: 5 mg via INTRAVENOUS
  Administered 2017-01-14: 2.5 mg via INTRAVENOUS

## 2017-01-14 MED ORDER — HYDROCHLOROTHIAZIDE 12.5 MG PO CAPS
12.5000 mg | ORAL_CAPSULE | Freq: Every day | ORAL | Status: DC
  Administered 2017-01-14: 12.5 mg via ORAL
  Filled 2017-01-14 (×2): qty 1

## 2017-01-14 MED ORDER — FENTANYL CITRATE (PF) 100 MCG/2ML IJ SOLN
INTRAMUSCULAR | Status: AC
  Filled 2017-01-14: qty 2

## 2017-01-14 MED ORDER — CHLORHEXIDINE GLUCONATE 4 % EX LIQD: 60.0000 mL | Freq: Once | CUTANEOUS | Status: DC

## 2017-01-14 MED ORDER — PROPOFOL 10 MG/ML IV BOLUS
INTRAVENOUS | Status: DC | PRN
  Administered 2017-01-14: 150 mg via INTRAVENOUS

## 2017-01-14 MED ORDER — MORPHINE SULFATE (PF) 2 MG/ML IV SOLN: 1.0000 mg | INTRAVENOUS | Status: DC | PRN

## 2017-01-14 MED ORDER — INSULIN ASPART 100 UNIT/ML ~~LOC~~ SOLN
0.0000 [IU] | Freq: Three times a day (TID) | SUBCUTANEOUS | Status: DC
  Administered 2017-01-15: 3 [IU] via SUBCUTANEOUS
  Administered 2017-01-15: 2 [IU] via SUBCUTANEOUS
  Filled 2017-01-14: qty 1

## 2017-01-14 MED ORDER — ONDANSETRON HCL 4 MG/2ML IJ SOLN
INTRAMUSCULAR | Status: DC | PRN
  Administered 2017-01-14: 4 mg via INTRAVENOUS

## 2017-01-14 MED ORDER — HYDROMORPHONE HCL 1 MG/ML IJ SOLN
0.2500 mg | INTRAMUSCULAR | Status: DC | PRN
  Administered 2017-01-14 (×4): 0.5 mg via INTRAVENOUS

## 2017-01-14 MED ORDER — SODIUM CHLORIDE 0.9 % IV SOLN
INTRAVENOUS | Status: DC
  Administered 2017-01-14: 22:00:00 via INTRAVENOUS

## 2017-01-14 MED ORDER — CEFAZOLIN SODIUM-DEXTROSE 2-4 GM/100ML-% IV SOLN
2.0000 g | INTRAVENOUS | Status: AC
  Administered 2017-01-14: 2 g via INTRAVENOUS
  Filled 2017-01-14: qty 100

## 2017-01-14 MED ORDER — POVIDONE-IODINE 10 % EX SWAB: 2.0000 "application " | Freq: Once | CUTANEOUS | Status: DC

## 2017-01-14 MED ORDER — FENTANYL CITRATE (PF) 100 MCG/2ML IJ SOLN
INTRAMUSCULAR | Status: AC
  Administered 2017-01-14: 50 ug via INTRAVENOUS
  Filled 2017-01-14: qty 2

## 2017-01-14 MED ORDER — OXYCODONE HCL 5 MG PO TABS: 5.0000 mg | ORAL_TABLET | ORAL | Status: DC | PRN

## 2017-01-14 MED ORDER — ONDANSETRON HCL 4 MG PO TABS: 4.0000 mg | ORAL_TABLET | Freq: Four times a day (QID) | ORAL | Status: DC | PRN

## 2017-01-14 MED ORDER — MIDAZOLAM HCL 2 MG/2ML IJ SOLN
INTRAMUSCULAR | Status: DC | PRN
  Administered 2017-01-14: 2 mg via INTRAVENOUS

## 2017-01-14 MED ORDER — PROMETHAZINE HCL 25 MG/ML IJ SOLN: 6.2500 mg | INTRAMUSCULAR | Status: DC | PRN

## 2017-01-14 MED ORDER — ACETAMINOPHEN 10 MG/ML IV SOLN
INTRAVENOUS | Status: AC
  Filled 2017-01-14: qty 100

## 2017-01-14 MED ORDER — OXYCODONE HCL 5 MG PO TABS
5.0000 mg | ORAL_TABLET | Freq: Once | ORAL | Status: AC | PRN
  Administered 2017-01-14: 5 mg via ORAL

## 2017-01-14 MED ORDER — IRBESARTAN 150 MG PO TABS
300.0000 mg | ORAL_TABLET | Freq: Every day | ORAL | Status: DC
  Administered 2017-01-14: 300 mg via ORAL
  Filled 2017-01-14 (×2): qty 1

## 2017-01-14 MED ORDER — HYDRALAZINE HCL 20 MG/ML IJ SOLN
10.0000 mg | Freq: Once | INTRAMUSCULAR | Status: AC
  Administered 2017-01-14: 10 mg via INTRAVENOUS

## 2017-01-14 MED ORDER — OXYCODONE HCL 5 MG/5ML PO SOLN
5.0000 mg | Freq: Once | ORAL | Status: AC | PRN
  Filled 2017-01-14: qty 5

## 2017-01-14 MED ORDER — ONDANSETRON HCL 4 MG/2ML IJ SOLN
4.0000 mg | Freq: Four times a day (QID) | INTRAMUSCULAR | Status: DC | PRN
  Administered 2017-01-14: 4 mg via INTRAVENOUS
  Filled 2017-01-14 (×2): qty 2

## 2017-01-14 MED ORDER — FENTANYL CITRATE (PF) 100 MCG/2ML IJ SOLN
25.0000 ug | INTRAMUSCULAR | Status: DC | PRN
Start: 2017-01-14 — End: 2017-01-14
  Administered 2017-01-14 (×2): 50 ug via INTRAVENOUS

## 2017-01-14 MED ORDER — METHOCARBAMOL 1000 MG/10ML IJ SOLN
500.0000 mg | Freq: Four times a day (QID) | INTRAVENOUS | Status: DC | PRN
  Administered 2017-01-14: 500 mg via INTRAVENOUS
  Filled 2017-01-14: qty 550
  Filled 2017-01-14: qty 5

## 2017-01-14 MED ORDER — CEFAZOLIN SODIUM-DEXTROSE 2-4 GM/100ML-% IV SOLN
2.0000 g | Freq: Four times a day (QID) | INTRAVENOUS | Status: AC
  Administered 2017-01-14 – 2017-01-15 (×3): 2 g via INTRAVENOUS
  Filled 2017-01-14 (×3): qty 100

## 2017-01-14 SURGICAL SUPPLY — 40 items
BAG SPEC THK2 15X12 ZIP CLS (MISCELLANEOUS) ×1
BAG ZIPLOCK 12X15 (MISCELLANEOUS) ×2 IMPLANT
BANDAGE ACE 6X5 VEL STRL LF (GAUZE/BANDAGES/DRESSINGS) ×2 IMPLANT
BANDAGE ELASTIC 6 VELCRO ST LF (GAUZE/BANDAGES/DRESSINGS) ×1 IMPLANT
BANDAGE ESMARK 6X9 LF (GAUZE/BANDAGES/DRESSINGS) ×1 IMPLANT
BNDG CMPR 9X6 STRL LF SNTH (GAUZE/BANDAGES/DRESSINGS) ×1
BNDG ESMARK 6X9 LF (GAUZE/BANDAGES/DRESSINGS) ×2
CUFF TOURN SGL QUICK 34 (TOURNIQUET CUFF) ×2
CUFF TRNQT CYL 34X4X40X1 (TOURNIQUET CUFF) ×1 IMPLANT
DRAPE EXTREMITY T 121X128X90 (DRAPE) ×2 IMPLANT
DRAPE U-SHAPE 47X51 STRL (DRAPES) ×2 IMPLANT
DRSG ADAPTIC 3X8 NADH LF (GAUZE/BANDAGES/DRESSINGS) ×2 IMPLANT
DRSG PAD ABDOMINAL 8X10 ST (GAUZE/BANDAGES/DRESSINGS) ×2 IMPLANT
DURAPREP 26ML APPLICATOR (WOUND CARE) ×2 IMPLANT
ELECT REM PT RETURN 9FT ADLT (ELECTROSURGICAL) ×2
ELECTRODE REM PT RTRN 9FT ADLT (ELECTROSURGICAL) ×1 IMPLANT
EVACUATOR 1/8 PVC DRAIN (DRAIN) ×2 IMPLANT
GAUZE SPONGE 4X4 12PLY STRL (GAUZE/BANDAGES/DRESSINGS) ×2 IMPLANT
GLOVE BIO SURGEON STRL SZ7.5 (GLOVE) ×2 IMPLANT
GLOVE BIO SURGEON STRL SZ8 (GLOVE) ×4 IMPLANT
GLOVE BIOGEL PI IND STRL 8 (GLOVE) ×2 IMPLANT
GLOVE BIOGEL PI INDICATOR 8 (GLOVE) ×2
GLOVE ECLIPSE 8.0 STRL XLNG CF (GLOVE) IMPLANT
GOWN STRL REUS W/TWL LRG LVL3 (GOWN DISPOSABLE) ×2 IMPLANT
GOWN STRL REUS W/TWL XL LVL3 (GOWN DISPOSABLE) ×4 IMPLANT
HANDPIECE INTERPULSE COAX TIP (DISPOSABLE) ×2
KIT BASIN OR (CUSTOM PROCEDURE TRAY) ×2 IMPLANT
MANIFOLD NEPTUNE II (INSTRUMENTS) ×2 IMPLANT
NS IRRIG 1000ML POUR BTL (IV SOLUTION) ×1 IMPLANT
PACK TOTAL JOINT (CUSTOM PROCEDURE TRAY) ×2 IMPLANT
PAD ABD 8X10 STRL (GAUZE/BANDAGES/DRESSINGS) ×1 IMPLANT
PADDING CAST COTTON 6X4 STRL (CAST SUPPLIES) ×3 IMPLANT
POSITIONER SURGICAL ARM (MISCELLANEOUS) ×2 IMPLANT
SET HNDPC FAN SPRY TIP SCT (DISPOSABLE) ×1 IMPLANT
STAPLER VISISTAT 35W (STAPLE) ×2 IMPLANT
STRIP CLOSURE SKIN 1/2X4 (GAUZE/BANDAGES/DRESSINGS) ×4 IMPLANT
SUT MNCRL AB 4-0 PS2 18 (SUTURE) ×2 IMPLANT
SUT VIC AB 2-0 CT1 27 (SUTURE) ×6
SUT VIC AB 2-0 CT1 TAPERPNT 27 (SUTURE) ×3 IMPLANT
SUT VLOC 180 0 24IN GS25 (SUTURE) ×2 IMPLANT

## 2017-01-14 NOTE — Anesthesia Postprocedure Evaluation (Addendum)
Anesthesia Post Note  Patient: Beth [name]  Procedure(s) Performed: Procedure(s) (LRB): IRRIGATION AND DEBRIDEMENT WOUND (Right)  Patient location during evaluation: PACU Anesthesia Type: General Level of consciousness: awake and alert Pain management: pain level controlled Vital Signs Assessment: post-procedure vital signs reviewed and stable Respiratory status: spontaneous breathing, nonlabored ventilation, respiratory function stable and patient connected to nasal cannula oxygen Cardiovascular status: blood pressure returned to baseline and stable Postop Assessment: no signs of nausea or vomiting Anesthetic complications: no Comments: Severe HTN preoperatively. Patient did not take any of her BP meds today. Gave hydralazine in PACU x 1, will have her take normal meds po when more awake       Last Vitals:  Vitals:   01/14/17 1747 01/14/17 1755  BP: (!) 231/124 (!) 177/106  Pulse:    Resp:    Temp:      Last Pain:  Vitals:   01/14/17 1745  TempSrc:   PainSc: 7                  Baylin Cabal S

## 2017-01-14 NOTE — Transfer of Care (Signed)
Immediate Anesthesia Transfer of Care Note  Patient: Beth [name]  Procedure(s) Performed: Procedure(s) with comments: IRRIGATION AND DEBRIDEMENT WOUND (Right) - requests 59mins  Patient Location: PACU  Anesthesia Type:General  Level of Consciousness: sedated  Airway & Oxygen Therapy: Patient Spontanous Breathing and Patient connected to face mask oxygen  Post-op Assessment: Report given to RN and Post -op Vital signs reviewed and stable  Post vital signs: Reviewed and stable  Last Vitals:  Vitals:   01/14/17 1420 01/14/17 1437  BP:  (!) 201/118  Pulse: 87   Resp: 16   Temp: 36.6 C     Last Pain:  Vitals:   01/14/17 1420  TempSrc: Oral      Patients Stated Pain Goal: 3 ([id number])  Complications: No apparent anesthesia complications

## 2017-01-14 NOTE — H&P (Signed)
CC- Beth [name] is a 71 y.o. female who presents with a right leg abscess.  HPI- . Knee Pain: Patient presents with pain and swelling involving the  right lower leg. Onset of the symptoms was several months ago. 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 She has done well with the knee but has developed an abscess in her right lower leg several inches distal to the implanty. This has been drained in the office but has recurred several times. She presents now for irrigation and debridement of this abscess..   Past Medical History:  Diagnosis Date  . Anemia   . Arthritis    Knee both knees  . Blood transfusion without reported diagnosis 2012   anemia;pt denies transfusion stated was only on iron tablet  . Cataract    left  . CKD (chronic kidney disease), stage III   . CVA (cerebral infarction)    2006  . Family history of anesthesia complication    sister very slow to awaken after anesthesia;severe vomiting   . Gout    left elbow  . Heart murmur   . 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 08-09-14 Right eye  . Hyperlipidemia   . Hypertension   . Nocturia    3-4 times per night  . Stroke [facility]) 2006   x 1 no deficits noted     Past Surgical History:  Procedure Laterality Date  . ABDOMINAL HYSTERECTOMY  1983  . CARPAL TUNNEL RELEASE Right 1983  . colonscopy  June 21, 2014  . EXCISIONAL TOTAL KNEE ARTHROPLASTY WITH ANTIBIOTIC SPACERS Right 02/16/2015   Procedure: RIGHT KNEE RESECTION ARTHROPLASTY WITH ANTIBIOTIC SPACERS;  Surgeon: [name], MD;  Location: [facility];  Service: Orthopedics;  Laterality: Right;  . I&D KNEE WITH POLY EXCHANGE Right 10/02/2014   Procedure: IRRIGATION AND DEBRIDEMENT RIGHT KNEE WITH POLY EXCHANGE;  Surgeon: Gearlean Alf, MD;  Location: [facility];  Service: Orthopedics;  Laterality: Right;  . JOINT REPLACEMENT  06/2014   right knee  . nasal cauterization  2012  . PATELLAR  TENDON REPAIR Right 08/11/2014   Procedure: RIGHT PATELLA TENDON REPAIR;  Surgeon: Gearlean Alf, MD;  Location: [facility];  Service: Orthopedics;  Laterality: Right;  . REIMPLANTATION OF TOTAL KNEE Right 05/23/2015   Procedure: RIGHT KNEE ARTHROPLASTY REIMPLANTATION;  Surgeon: [name], MD;  Location: [facility];  Service: Orthopedics;  Laterality: Right;  . TOTAL KNEE ARTHROPLASTY Right 07/03/2014   Procedure: RIGHT TOTAL KNEE ARTHROPLASTY;  Surgeon: Gearlean Alf, MD;  Location: [facility];  Service: Orthopedics;  Laterality: Right;  . TUBAL LIGATION      Prior to Admission medications   Medication Sig Start Date End Date Taking? Authorizing Provider  acetaminophen (TYLENOL) 500 MG tablet Take 1,000 mg by mouth every 6 (six) hours as needed for mild pain.   Yes Historical Provider, MD  amLODipine (NORVASC) 10 MG tablet Take 1 tablet (10 mg total) by mouth every evening. 09/05/16  Yes Claretta Fraise, MD  atorvastatin (LIPITOR) 20 MG tablet Take 1 tablet (20 mg total) by mouth every evening. 09/05/16  Yes Claretta Fraise, MD  Cyanocobalamin (VITAMIN B 12 PO) Take 1 tablet by mouth daily.   Yes Historical Provider, MD  metFORMIN (GLUCOPHAGE-XR) 500 MG 24 hr tablet TAKE ONE TABLET BY MOUTH ONCE DAILY WITH BREAKFAST Patient taking differently: Take 500 mg by mouth 2 (two) times daily.  11/19/16  Yes Claretta Fraise, MD  Multiple Vitamin (MULTIVITAMIN WITH MINERALS) TABS tablet Take 1 tablet by mouth every other day.   Yes Historical Provider, MD  Nebivolol HCl (BYSTOLIC) 20 MG TABS Take 1 tablet (20 mg total) by mouth every evening. For blood pressure 04/21/16  Yes Claretta Fraise, MD  olmesartan-hydrochlorothiazide (BENICAR HCT) 40-12.5 MG tablet Take 1 tablet by mouth daily. In the morning 09/05/16  Yes Claretta Fraise, MD  ONE TOUCH ULTRA TEST test strip  11/18/16  Yes Historical Provider, MD   Right lower leg soft tissue tenderness over 1 x 2 cm area of abscess, minimal drainage, no surrounding  erythema  Physical Examination: General appearance - alert, well appearing, and in no distress Mental status - alert, oriented to person, place, and time Chest - clear to auscultation, no wheezes, rales or rhonchi, symmetric air entry Heart - normal rate, regular rhythm, normal S1, S2, no murmurs, rubs, clicks or gallops Abdomen - soft, nontender, nondistended, no masses or organomegaly Neurological - alert, oriented, normal speech, no focal findings or movement disorder noted   Asessment/Plan--- Right leg abscess- - Plan irrigation and  debridement. Procedure risks and potential comps discussed with patient who elects to proceed.

## 2017-01-14 NOTE — Brief Op Note (Signed)
01/14/2017  5:36 PM  PATIENT:  Beth [name]  71 y.o. female  PRE-OPERATIVE DIAGNOSIS:  right lower leg abscess  POST-OPERATIVE DIAGNOSIS:  right lower leg abscess  PROCEDURE:  Irrigation and debridement right leg abscess  SURGEON:  Surgeon(s) and Role:    * [name], MD - Primary  PHYSICIAN ASSISTANT:   ASSISTANTS: none   ANESTHESIA:   general  EBL:  Total I/O In: 800 [I.V.:800] Out: 10 [Blood:10]  BLOOD ADMINISTERED:none  DRAINS: none   LOCAL MEDICATIONS USED:  NONE  COUNTS:  YES  TOURNIQUET:   Total Tourniquet Time Documented: Thigh (Right) - 2 minutes Total: Thigh (Right) - 2 minutes   DICTATION: .Other Dictation: Dictation Number [id number]  PLAN OF CARE: Admit for overnight observation  PATIENT DISPOSITION:  PACU - hemodynamically stable.

## 2017-01-14 NOTE — Anesthesia Procedure Notes (Signed)
Procedure Name: LMA Insertion Date/Time: 01/14/2017 4:53 PM Performed by: Cynda Familia Pre-anesthesia Checklist: Patient identified, Emergency Drugs available, Suction available and Patient being monitored Patient Re-evaluated:Patient Re-evaluated prior to inductionOxygen Delivery Method: Circle System Utilized Preoxygenation: Pre-oxygenation with 100% oxygen Intubation Type: IV induction Ventilation: Mask ventilation without difficulty LMA: LMA inserted LMA Size: 4.0 Number of attempts: 1 Placement Confirmation: positive ETCO2 Tube secured with: Tape Dental Injury: Teeth and Oropharynx as per pre-operative assessment  Comments: Smooth IV induction Rose--- LMA AM CRNA atraumatic--- teeth and mouth as preop- many missing and chipped teeth - very poor dentition-- bilat BS Rose

## 2017-01-14 NOTE — Anesthesia Preprocedure Evaluation (Addendum)
Anesthesia Evaluation  Patient identified by MRN, date of birth, ID band Patient awake    Reviewed: Allergy & Precautions, NPO status , Patient's Chart, lab work & pertinent test results  Airway Mallampati: II  TM Distance: >3 FB Neck ROM: Full    Dental  (+) Dental Advisory Given, Missing, Chipped   Pulmonary neg pulmonary ROS,    Pulmonary exam normal breath sounds clear to auscultation       Cardiovascular hypertension, Normal cardiovascular exam Rhythm:Regular Rate:Normal     Neuro/Psych CVA, No Residual Symptoms negative psych ROS   GI/Hepatic negative GI ROS, Neg liver ROS,   Endo/Other  diabetes  Renal/GU Renal InsufficiencyRenal disease  negative genitourinary   Musculoskeletal negative musculoskeletal ROS (+)   Abdominal   Peds negative pediatric ROS (+)  Hematology negative hematology ROS (+)   Anesthesia Other Findings   Reproductive/Obstetrics negative OB ROS                           Anesthesia Physical Anesthesia Plan  ASA: III  Anesthesia Plan: General   Post-op Pain Management:    Induction: Intravenous  Airway Management Planned: LMA  Additional Equipment:   Intra-op Plan:   Post-operative Plan: Extubation in OR  Informed Consent: I have reviewed the patients History and Physical, chart, labs and discussed the procedure including the risks, benefits and alternatives for the proposed anesthesia with the patient or authorized representative who has indicated his/her understanding and acceptance.   Dental advisory given  Plan Discussed with: CRNA and Surgeon  Anesthesia Plan Comments:         Anesthesia Quick Evaluation

## 2017-01-15 ENCOUNTER — Encounter (HOSPITAL_COMMUNITY): Payer: Self-pay | Admitting: Orthopedic Surgery

## 2017-01-15 DIAGNOSIS — E785 Hyperlipidemia, unspecified: Secondary | ICD-10-CM | POA: Diagnosis not present

## 2017-01-15 DIAGNOSIS — Z96651 Presence of right artificial knee joint: Secondary | ICD-10-CM | POA: Diagnosis not present

## 2017-01-15 DIAGNOSIS — E1122 Type 2 diabetes mellitus with diabetic chronic kidney disease: Secondary | ICD-10-CM | POA: Diagnosis not present

## 2017-01-15 DIAGNOSIS — L02415 Cutaneous abscess of right lower limb: Secondary | ICD-10-CM | POA: Diagnosis not present

## 2017-01-15 DIAGNOSIS — I129 Hypertensive chronic kidney disease with stage 1 through stage 4 chronic kidney disease, or unspecified chronic kidney disease: Secondary | ICD-10-CM | POA: Diagnosis not present

## 2017-01-15 DIAGNOSIS — N183 Chronic kidney disease, stage 3 (moderate): Secondary | ICD-10-CM | POA: Diagnosis not present

## 2017-01-15 DIAGNOSIS — Z8673 Personal history of transient ischemic attack (TIA), and cerebral infarction without residual deficits: Secondary | ICD-10-CM | POA: Diagnosis not present

## 2017-01-15 DIAGNOSIS — Z7984 Long term (current) use of oral hypoglycemic drugs: Secondary | ICD-10-CM | POA: Diagnosis not present

## 2017-01-15 DIAGNOSIS — Z79899 Other long term (current) drug therapy: Secondary | ICD-10-CM | POA: Diagnosis not present

## 2017-01-15 LAB — GLUCOSE, CAPILLARY
GLUCOSE-CAPILLARY: 148 mg/dL — AB (ref 65–99)
Glucose-Capillary: 177 mg/dL — ABNORMAL HIGH (ref 65–99)

## 2017-01-15 MED ORDER — METHOCARBAMOL 500 MG PO TABS: 500.0000 mg | ORAL_TABLET | Freq: Four times a day (QID) | ORAL | 0 refills | Status: DC | PRN

## 2017-01-15 MED ORDER — PROMETHAZINE HCL 25 MG/ML IJ SOLN
6.2500 mg | Freq: Four times a day (QID) | INTRAMUSCULAR | Status: DC | PRN
  Administered 2017-01-15: 6.25 mg via INTRAVENOUS
  Filled 2017-01-15: qty 1

## 2017-01-15 MED ORDER — CEPHALEXIN 500 MG PO CAPS: 500.0000 mg | ORAL_CAPSULE | Freq: Three times a day (TID) | ORAL | 0 refills | Status: DC

## 2017-01-15 MED ORDER — HYDROCHLOROTHIAZIDE 12.5 MG PO CAPS
12.5000 mg | ORAL_CAPSULE | Freq: Every day | ORAL | Status: DC
  Administered 2017-01-15: 12.5 mg via ORAL
  Filled 2017-01-15: qty 1

## 2017-01-15 MED ORDER — TRAMADOL HCL 50 MG PO TABS: 50.0000 mg | ORAL_TABLET | Freq: Four times a day (QID) | ORAL | 0 refills | Status: DC | PRN

## 2017-01-15 MED ORDER — OXYCODONE HCL 5 MG PO TABS: 5.0000 mg | ORAL_TABLET | ORAL | 0 refills | Status: DC | PRN

## 2017-01-15 MED ORDER — IRBESARTAN 150 MG PO TABS
300.0000 mg | ORAL_TABLET | Freq: Every day | ORAL | Status: DC
  Administered 2017-01-15: 08:00:00 300 mg via ORAL
  Filled 2017-01-15: qty 2

## 2017-01-15 NOTE — Op Note (Signed)
[name], Beth [name] NO.:  [id number]  MEDICAL RECORD NO.:  [id number]  LOCATION:                                 FACILITY:  PHYSICIAN:  [name], M.D.         DATE OF BIRTH:  DATE OF PROCEDURE:  01/14/2017 DATE OF DISCHARGE:                              OPERATIVE REPORT   PREOPERATIVE DIAGNOSIS:  Right lower leg abscess.  POSTOPERATIVE DIAGNOSIS:  Right lower leg abscess.  PROCEDURE:  Irrigation and debridement, right lower leg abscess.  SURGEON:  [name], MD.  ASSISTANT:  None.  ANESTHESIA:  General.  ESTIMATED BLOOD LOSS:  10 mL.  DRAINS:  None.  TOURNIQUET TIME:  2 minutes as the tourniquet was not functional.  COMPLICATIONS:  None.  CONDITION:  Stable to recovery.  BRIEF CLINICAL NOTE:  Ms. __________ -year-old female with complex history in regard to her right leg.  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  She has developed an abscess on her lower leg far away from the knee.  It has drained intermittently for several months now.  She presents now for irrigation and debridement, as it was not healing on its own.  PROCEDURE IN DETAIL:  After successful administration of general anesthetic, tourniquet placed on the right thigh, right lower extremity, prepped and draped in the usual sterile fashion.  Extremities wrapped in Esmarch, tourniquet was inflated to 300 mmHg.  There was a small fluctuant area about 5 x 5 mm in the right mid to lower leg.  I made a longitudinal incision about 2 cm above, 2 cm below when you lift this area out.  There was some fibrinous debris and fibrous tissue but I did not encounter any gross pus.  I palpated and applied pressure to all the areas around __________ and could not express any fluid.  It did not go into bone.  I debrided down to the bone but there was no communication through the periosteum.  I felt the area was adequately cleaned up.   Of note, the tourniquet was not functional, so we let the tourniquet down after 2 minutes.  Minor bleeding was stopped with cautery.  I thoroughly irrigated the area with a liter of saline using pulsatile lavage.  The subcu was closed with interrupted 2-0 Vicryl.  It was difficulty to close the subcu in the central area where I had ellipsed out the skin. I was able to approximate it under tension.  I closed the skin with staples and was able to get the wound closed but it was under tension. The incision was then cleaned and dried and a bulky sterile dressing was applied.  She was then awakened and transported to recovery in stable condition.     [name], M.D.     FA/MEDQ  D:  01/14/2017  T:  01/15/2017  Job:  [id number]

## 2017-01-15 NOTE — Progress Notes (Signed)
Pt to d/c home. AVS reviewed and "My Chart" discussed with pt. Pt capable of verbalizing medications, signs and symptoms of infection, and follow-up appointments. Remains hemodynamically stable. No signs and symptoms of distress. Educated pt to return to ER in the case of SOB, dizziness, or chest pain.  

## 2017-01-15 NOTE — Discharge Instructions (Signed)
INSTRUCTIONS  o Remove items at home which could result in a fall. This includes throw rugs or furniture in walking pathways o ICE to the affected joint every three hours while awake for 30 minutes at a time, for at least the first 3-5 days, and then as needed for pain and swelling.  Continue to use ice for pain and swelling. You may notice swelling that will progress down to the foot and ankle.  This is normal after surgery.  Elevate your leg when you are not up walking on it.   o Continue to use the breathing machine you got in the hospital (incentive spirometer) which will help keep your temperature down.  It is common for your temperature to cycle up and down following surgery, especially at night when you are not up moving around and exerting yourself.  The breathing machine keeps your lungs expanded and your temperature down.   DIET:  As you were doing prior to hospitalization, we recommend a well-balanced diet.  DRESSING / WOUND CARE / SHOWERING  Keep the surgical dressing until follow up.  IF THE DRESSING FALLS OFF or the wound gets wet inside, change the dressing with sterile gauze.  Please use good hand washing techniques before changing the dressing.  Do not use any lotions or creams on the incision until instructed by your surgeon.    ACTIVITY  o Increase activity slowly as tolerated, but follow the weight bearing instructions below.   o No driving for 6 weeks or until further direction given by your physician.  You cannot drive while taking narcotics.  o No lifting or carrying greater than 10 lbs. until further directed by your surgeon. o Avoid periods of inactivity such as sitting longer than an hour when not asleep. This helps prevent blood clots.  o You may return to work once you are authorized by your doctor.     WEIGHT BEARING   Weight bearing as tolerated with assist device (walker, cane, etc) as directed, use it as long as suggested by your surgeon or therapist,  typically at least 4-6 weeks.   CONSTIPATION  Constipation is defined medically as fewer than three stools per week and severe constipation as less than one stool per week.  Even if you have a regular bowel pattern at home, your normal regimen is likely to be disrupted due to multiple reasons following surgery.  Combination of anesthesia, postoperative narcotics, change in appetite and fluid intake all can affect your bowels.   YOU MUST use at least one of the following options; they are listed in order of increasing strength to get the job done.  They are all available over the counter, and you may need to use some, POSSIBLY even all of these options:    Drink plenty of fluids (prune juice may be helpful) and high fiber foods Colace 100 mg by mouth twice a day  Senokot for constipation as directed and as needed Dulcolax (bisacodyl), take with full glass of water  Miralax (polyethylene glycol) once or twice a day as needed.  If you have tried all these things and are unable to have a bowel movement in the first 3-4 days after surgery call either your surgeon or your primary doctor.    If you experience loose stools or diarrhea, hold the medications until you stool forms back up.  If your symptoms do not get better within 1 week or if they get worse, check with your doctor.  If you experience "the  worst abdominal pain ever" or develop nausea or vomiting, please contact the office immediately for further recommendations for treatment.   ITCHING:  If you experience itching with your medications, try taking only a single pain pill, or even half a pain pill at a time.  You can also use Benadryl over the counter for itching or also to help with sleep.    MEDICATIONS:  See your medication summary on the After Visit Summary that nursing will review with you.  You may have some home medications which will be placed on hold until you complete the course of blood thinner medication.  It is important for  you to complete the blood thinner medication as prescribed.  PRECAUTIONS:  If you experience chest pain or shortness of breath - call 911 immediately for transfer to the hospital emergency department.   If you develop a fever greater that 101 F, purulent drainage from wound, increased redness or drainage from wound, foul odor from the wound/dressing, or calf pain - CONTACT YOUR SURGEON.                                                   FOLLOW-UP APPOINTMENTS:  If you do not already have a post-op appointment, please call the office for an appointment to be seen by your surgeon.  Guidelines for how soon to be seen are listed in your After Visit Summary, but are typically between 1-4 weeks after surgery.  MAKE SURE YOU:   Understand these instructions.   Get help right away if you are not doing well or get worse.    Thank you for letting us be a part of your medical care team.  It is a privilege we respect greatly.  We hope these instructions will help you stay on track for a fast and full recovery!

## 2017-01-15 NOTE — Progress Notes (Addendum)
Pt having on-going nausea and vomited twice since arriving to floor. Zofran and Reglan administered. Notified on-call PA Dierdre Highman, orders received for 6.25 to 12.5 Phenergan IV every 6 hours as needed for nausea. Will continue to monitor.

## 2017-01-15 NOTE — Progress Notes (Signed)
   Subjective: 1 Day Post-Op Procedure(s) (LRB): IRRIGATION AND DEBRIDEMENT WOUND (Right) Patient reports pain as mild.   Patient seen in rounds with Dr. Wynelle Link. Patient is well, but has had some minor complaints of pain in the leg, requiring pain medications Patient is ready to go home  Objective: Vital signs in last 24 hours: Temp:  [97.3 F (36.3 C)-97.9 F (36.6 C)] 97.6 F (36.4 C) (02/08 0457) Pulse Rate:  [67-87] 72 (02/08 0457) Resp:  [11-19] 16 (02/08 0457) BP: (158-231)/(88-124) 170/92 (02/08 0457) SpO2:  [97 %-100 %] 99 % (02/08 0457) Weight:  [88.9 kg (196 lb)] 88.9 kg (196 lb) (02/07 1427)  Intake/Output from previous day:  Intake/Output Summary (Last 24 hours) at 01/15/17 0734 Last data filed at 01/15/17 0600  Gross per 24 hour  Intake             1770 ml  Output              560 ml  Net             1210 ml    Intake/Output this shift: No intake/output data recorded.  Labs: No results for input(s): HGB in the last 72 hours. No results for input(s): WBC, RBC, HCT, PLT in the last 72 hours. No results for input(s): NA, K, CL, CO2, BUN, CREATININE, GLUCOSE, CALCIUM in the last 72 hours. No results for input(s): LABPT, INR in the last 72 hours.  EXAM: General - Patient is Alert, Appropriate and Oriented Extremity - Neurovascular intact Sensation intact distally Dorsiflexion/Plantar flexion intact Dressing - clean, dry, no drainage Motor Function - intact, moving foot and toes well on exam.   Assessment/Plan: 1 Day Post-Op Procedure(s) (LRB): IRRIGATION AND DEBRIDEMENT WOUND (Right) Procedure(s) (LRB): IRRIGATION AND DEBRIDEMENT WOUND (Right) Past Medical History:  Diagnosis Date  . Anemia   . Arthritis    Knee both knees  . Blood transfusion without reported diagnosis 2012   anemia;pt denies transfusion stated was only on iron tablet  . Cataract    left  . CKD (chronic kidney disease), stage III   . CVA (cerebral infarction)    2006  .  Diabetes mellitus without complication ([place])   . Family history of anesthesia complication    sister very slow to awaken after anesthesia;severe vomiting   . Gout    left elbow  . Heart murmur   . 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 08-09-14 Right eye  . Hyperlipidemia   . Hypertension   . Nocturia    3-4 times per night  . Stroke ([place]) 2006   x 1 no deficits noted    Principal Problem:   Cellulitis and abscess of leg Active Problems:   Abscess of right leg  Estimated body mass index is 33.64 kg/m as calculated from the following:   Height as of this encounter: 5\' 4"  (1.626 m).   Weight as of this encounter: 88.9 kg (196 lb). Discharge home Diet - Cardiac diet and Diabetic diet Follow up - next Thursday 01/22/2017 Activity - WBAT Disposition - Home Condition Upon Discharge - Stable D/C Meds - See DC Summary   Leave the dressing on until next week follow up appointment with Dr. Wynelle Link.  [name], PA-C Orthopaedic Surgery 01/15/2017, 7:34 AM

## 2017-01-15 NOTE — Discharge Summary (Signed)
Physician Discharge Summary   Patient ID: Beth [name] MRN: [id number] DOB/AGE: Sep 16, 1946 71 y.o.  Admit date: 01/14/2017 Discharge date: 01/15/2017  Primary Diagnosis:   right lower leg abscess  Admission Diagnoses:  Past Medical History:  Diagnosis Date  . Anemia   . Arthritis    Knee both knees  . Blood transfusion without reported diagnosis 2012   anemia;pt denies transfusion stated was only on iron tablet  . Cataract    left  . CKD (chronic kidney disease), stage III   . CVA (cerebral infarction)    2006  . Diabetes mellitus without complication ([place])   . Family history of anesthesia complication    sister very slow to awaken after anesthesia;severe vomiting   . Gout    left elbow  . Heart murmur   . 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 08-09-14 Right eye  . Hyperlipidemia   . Hypertension   . Nocturia    3-4 times per night  . Stroke ([place]) 2006   x 1 no deficits noted    Discharge Diagnoses:   Principal Problem:   Cellulitis and abscess of leg Active Problems:   Abscess of right leg  Procedure:  Procedure(s) (LRB): IRRIGATION AND DEBRIDEMENT WOUND (Right)   Consults: None  HPI:  Ms. Castrillo is a 71 year old female with complex history in regard to her right leg.  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  She has developed an abscess on her lower leg far away from the knee.  It has drained intermittently for several months now.  She presents now for irrigation and debridement, as it was not healing on its own.    Laboratory Data: Hospital Outpatient Visit on 01/08/2017  Component Date Value Ref Range Status  . Glucose-Capillary 01/08/2017 97  65 - 99 mg/dL Final  . Sodium 01/08/2017 140  135 - 145 mmol/L Final  . Potassium 01/08/2017 4.0  3.5 - 5.1 mmol/L Final  . Chloride 01/08/2017 102  101 - 111 mmol/L Final  . CO2 01/08/2017 29  22 - 32 mmol/L Final  . Glucose, Bld 01/08/2017 117* 65  - 99 mg/dL Final  . BUN 01/08/2017 34* 6 - 20 mg/dL Final  . Creatinine, Ser 01/08/2017 1.01* 0.44 - 1.00 mg/dL Final  . Calcium 01/08/2017 9.7  8.9 - 10.3 mg/dL Final  . GFR calc non Af Amer 01/08/2017 54* >60 mL/min Final  . GFR calc Af Amer 01/08/2017 >60  >60 mL/min Final   Comment: (NOTE) The eGFR has been calculated using the CKD EPI equation. This calculation has not been validated in all clinical situations. eGFR's persistently <60 mL/min signify possible Chronic Kidney Disease.   . Anion gap 01/08/2017 9  5 - 15 Final  . Hgb A1c MFr Bld 01/08/2017 5.9* 4.8 - 5.6 % Final   Comment: (NOTE)         Pre-diabetes: 5.7 - 6.4         Diabetes: >6.4         Glycemic control for adults with diabetes: <7.0   . Mean Plasma Glucose 01/08/2017 123  mg/dL Final   Comment: (NOTE) Performed At: [facility] [place], [place] [id number] Lindon Romp MD [id number]   . WBC 01/08/2017 7.5  4.0 - 10.5 K/uL Final  . RBC 01/08/2017 4.86  3.87 - 5.11 MIL/uL Final  . Hemoglobin 01/08/2017 12.1  12.0 - 15.0 g/dL Final  . HCT  01/08/2017 38.7  36.0 - 46.0 % Final  . MCV 01/08/2017 79.6  78.0 - 100.0 fL Final  . MCH 01/08/2017 24.9* 26.0 - 34.0 pg Final  . MCHC 01/08/2017 31.3  30.0 - 36.0 g/dL Final  . RDW 01/08/2017 15.6* 11.5 - 15.5 % Final  . Platelets 01/08/2017 358  150 - 400 K/uL Final   No results for input(s): HGB in the last 72 hours. No results for input(s): WBC, RBC, HCT, PLT in the last 72 hours. No results for input(s): NA, K, CL, CO2, BUN, CREATININE, GLUCOSE, CALCIUM in the last 72 hours. No results for input(s): LABPT, INR in the last 72 hours.  X-Rays:No results found.  EKG: Orders placed or performed during the hospital encounter of 02/28/16  . ED EKG  . ED EKG  . EKG 12-Lead  . EKG 12-Lead  . EKG     Hospital Course: Patient was admitted to [facility] and taken to the OR and underwent the above state procedure without  complications.  Patient tolerated the procedure well and was later transferred to the recovery room and then to the orthopaedic floor for postoperative care.  They were given PO and IV analgesics for pain control following their surgery.  They were given 24 hours of postoperative antibiotics.  The patient was allowed to be WBAT to the right leg. Discharge planning was consulted to help with postop disposition and equipment needs.  Patient had a decent night on the evening of surgery and started to get up OOB with therapy on day one. Patient was seen in rounds and was ready to go home on day one.  They were given discharge instructions and dressing directions.  They were instructed on when to follow up in the office with Dr. Wynelle Link in one week from discharge.  Discharge home Diet - Cardiac diet and Diabetic diet Follow up - next Thursday 01/22/2017 Activity - WBAT Disposition - Home Condition Upon Discharge - Stable D/C Meds - See DC Summary   Leave the dressing on until next week follow up appointment with Dr. Wynelle Link.  Allergies as of 01/15/2017      Reactions   Aleve [naproxen Sodium]    Heart races   Asa [aspirin]    Nose bleeding   Clonidine Derivatives Other (See Comments)   Dizziness and weakness   Shellfish Allergy Nausea And Vomiting      Medication List    TAKE these medications   acetaminophen 500 MG tablet Commonly known as:  TYLENOL Take 1,000 mg by mouth every 6 (six) hours as needed for mild pain.   amLODipine 10 MG tablet Commonly known as:  NORVASC Take 1 tablet (10 mg total) by mouth every evening.   atorvastatin 20 MG tablet Commonly known as:  LIPITOR Take 1 tablet (20 mg total) by mouth every evening.   cephALEXin 500 MG capsule Commonly known as:  KEFLEX Take 1 capsule (500 mg total) by mouth 3 (three) times daily.   metFORMIN 500 MG 24 hr tablet Commonly known as:  GLUCOPHAGE-XR TAKE ONE TABLET BY MOUTH ONCE DAILY WITH BREAKFAST What changed:  how  much to take  how to take this  when to take this  additional instructions   methocarbamol 500 MG tablet Commonly known as:  ROBAXIN Take 1 tablet (500 mg total) by mouth every 6 (six) hours as needed for muscle spasms.   multivitamin with minerals Tabs tablet Take 1 tablet by mouth every other day.   Nebivolol  HCl 20 MG Tabs Commonly known as:  BYSTOLIC Take 1 tablet (20 mg total) by mouth every evening. For blood pressure   olmesartan-hydrochlorothiazide 40-12.5 MG tablet Commonly known as:  BENICAR HCT Take 1 tablet by mouth daily. In the morning   ONE TOUCH ULTRA TEST test strip Generic drug:  glucose blood   oxyCODONE 5 MG immediate release tablet Commonly known as:  Oxy IR/ROXICODONE Take 1-2 tablets (5-10 mg total) by mouth every 4 (four) hours as needed for moderate pain or severe pain.   traMADol 50 MG tablet Commonly known as:  ULTRAM Take 1-2 tablets (50-100 mg total) by mouth every 6 (six) hours as needed for moderate pain.   VITAMIN B 12 PO Take 1 tablet by mouth daily.      Follow-up Information    Gearlean Alf, MD. Schedule an appointment as soon as possible for a visit on 01/22/2017.   Specialty:  Orthopedic Surgery Why:  Call ASAP at [phone] to setup appointment time with Dr. Wynelle Link for next Thursday 01/22/2017. Contact information: [address] [place] [address] [phone]           Signed: [name], PA-C Orthopaedic Surgery 01/15/2017, 7:51 AM

## 2017-02-11 ENCOUNTER — Encounter (HOSPITAL_COMMUNITY): Payer: Self-pay

## 2017-02-11 ENCOUNTER — Emergency Department (HOSPITAL_COMMUNITY)
Admission: EM | Admit: 2017-02-11 | Discharge: 2017-02-12 | Disposition: A | Discharge status: Home / Self Care | Payer: Medicare Other | Attending: Emergency Medicine | Admitting: Emergency Medicine

## 2017-02-11 DIAGNOSIS — N183 Chronic kidney disease, stage 3 (moderate): Secondary | ICD-10-CM | POA: Diagnosis not present

## 2017-02-11 DIAGNOSIS — Z8673 Personal history of transient ischemic attack (TIA), and cerebral infarction without residual deficits: Secondary | ICD-10-CM | POA: Insufficient documentation

## 2017-02-11 DIAGNOSIS — I129 Hypertensive chronic kidney disease with stage 1 through stage 4 chronic kidney disease, or unspecified chronic kidney disease: Secondary | ICD-10-CM | POA: Insufficient documentation

## 2017-02-11 DIAGNOSIS — E1122 Type 2 diabetes mellitus with diabetic chronic kidney disease: Secondary | ICD-10-CM | POA: Diagnosis not present

## 2017-02-11 DIAGNOSIS — N3090 Cystitis, unspecified without hematuria: Secondary | ICD-10-CM | POA: Diagnosis not present

## 2017-02-11 DIAGNOSIS — N3091 Cystitis, unspecified with hematuria: Secondary | ICD-10-CM | POA: Diagnosis not present

## 2017-02-11 DIAGNOSIS — Z96651 Presence of right artificial knee joint: Secondary | ICD-10-CM | POA: Diagnosis not present

## 2017-02-11 DIAGNOSIS — Z7984 Long term (current) use of oral hypoglycemic drugs: Secondary | ICD-10-CM | POA: Diagnosis not present

## 2017-02-11 DIAGNOSIS — R319 Hematuria, unspecified: Secondary | ICD-10-CM | POA: Diagnosis present

## 2017-02-11 LAB — CBC
HCT: 40.4 % (ref 36.0–46.0)
Hemoglobin: 13.1 g/dL (ref 12.0–15.0)
MCH: 25.4 pg — ABNORMAL LOW (ref 26.0–34.0)
MCHC: 32.4 g/dL (ref 30.0–36.0)
MCV: 78.3 fL (ref 78.0–100.0)
PLATELETS: 315 10*3/uL (ref 150–400)
RBC: 5.16 MIL/uL — ABNORMAL HIGH (ref 3.87–5.11)
RDW: 15.6 % — ABNORMAL HIGH (ref 11.5–15.5)
WBC: 8.7 10*3/uL (ref 4.0–10.5)

## 2017-02-11 LAB — URINALYSIS, ROUTINE W REFLEX MICROSCOPIC
Bilirubin Urine: NEGATIVE
GLUCOSE, UA: 50 mg/dL — AB
Ketones, ur: NEGATIVE mg/dL
NITRITE: NEGATIVE
PH: 6 (ref 5.0–8.0)
Protein, ur: 100 mg/dL — AB
SPECIFIC GRAVITY, URINE: 1.01 (ref 1.005–1.030)

## 2017-02-11 LAB — COMPREHENSIVE METABOLIC PANEL
ALT: 11 U/L — AB (ref 14–54)
AST: 17 U/L (ref 15–41)
Albumin: 4 g/dL (ref 3.5–5.0)
Alkaline Phosphatase: 105 U/L (ref 38–126)
Anion gap: 12 (ref 5–15)
BILIRUBIN TOTAL: 1.1 mg/dL (ref 0.3–1.2)
BUN: 22 mg/dL — AB (ref 6–20)
CO2: 26 mmol/L (ref 22–32)
CREATININE: 1.07 mg/dL — AB (ref 0.44–1.00)
Calcium: 9.8 mg/dL (ref 8.9–10.3)
Chloride: 101 mmol/L (ref 101–111)
GFR, EST AFRICAN AMERICAN: 58 mL/min — AB (ref >60)
GFR, EST NON AFRICAN AMERICAN: 50 mL/min — AB (ref >60)
Glucose, Bld: 104 mg/dL — ABNORMAL HIGH (ref 65–99)
POTASSIUM: 3.8 mmol/L (ref 3.5–5.1)
Sodium: 139 mmol/L (ref 135–145)
TOTAL PROTEIN: 8.5 g/dL — AB (ref 6.5–8.1)

## 2017-02-11 LAB — LIPASE, BLOOD: Lipase: 33 U/L (ref 11–51)

## 2017-02-11 NOTE — ED Provider Notes (Signed)
[facility] DEPT Provider Note   CSN: [id number] Arrival date & time: 02/11/17  1834  By signing my name below, I, Collene Leyden, attest that this documentation has been prepared under the direction and in the presence of Valera Vallas, MD. Electronically Signed: Collene Leyden, Scribe. 02/11/17. 11:24 PM.  History   Chief Complaint Chief Complaint  Patient presents with  . Hematuria  . Abdominal Pain    HPI Comments: RINDY KOLLMAN is a 71 y.o. female with a history of HTN, CKD, CVA, and HLD, who presents to the Emergency Department complaining of sudden-onset, gradually worsening hematuria that began one week ago. Patient reports associated dysuria on the left side and urinary frequency. Patient states the urine is reddish pinkish in color. Patient reports having knee replacement surgery two weeks ago. No modifying factors indicated. Patient denies any fever, nausea, vomiting, or any other symptoms.   The history is provided by the patient. No language interpreter was used.  Hematuria  This is a new problem. Episode onset: One weeks ago. The problem has been gradually worsening. Pertinent negatives include no abdominal pain. Nothing aggravates the symptoms. Nothing relieves the symptoms. She has tried nothing for the symptoms. The treatment provided no relief.    Past Medical History:  Diagnosis Date  . Anemia   . Arthritis    Knee both knees  . Blood transfusion without reported diagnosis 2012   anemia;pt denies transfusion stated was only on iron tablet  . Cataract    left  . CKD (chronic kidney disease), stage III   . CVA (cerebral infarction)    2006  . Diabetes mellitus without complication ([place])   . Family history of anesthesia complication    sister very slow to awaken after anesthesia;severe vomiting   . Gout    left elbow  . Heart murmur   . 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 08-09-14 Right eye  . Hyperlipidemia   . Hypertension   . Nocturia    3-4  times per night  . Stroke [facility]) 2006   x 1 no deficits noted     Patient Active Problem List   Diagnosis Date Noted  . Cellulitis and abscess of leg 01/14/2017  . Abscess of right leg 01/14/2017  . Diabetes type 2, controlled ([place]) 03/07/2016  . Gout   . Essential hypertension   . Enuresis 11/19/2015  . HLD (hyperlipidemia) 01/04/2014  . Anemia, iron deficiency 12/30/2011  . Constipation 04/04/2009  . TUBULOVILLOUS ADENOMA, COLON 04/03/2009    Past Surgical History:  Procedure Laterality Date  . ABDOMINAL HYSTERECTOMY  1983  . CARPAL TUNNEL RELEASE Right 1983  . colonscopy  June 21, 2014  . EXCISIONAL TOTAL KNEE ARTHROPLASTY WITH ANTIBIOTIC SPACERS Right 02/16/2015   Procedure: RIGHT KNEE RESECTION ARTHROPLASTY WITH ANTIBIOTIC SPACERS;  Surgeon: [name], MD;  Location: [facility] ORS;  Service: Orthopedics;  Laterality: Right;  . I&D KNEE WITH POLY EXCHANGE Right 10/02/2014   Procedure: IRRIGATION AND DEBRIDEMENT RIGHT KNEE WITH POLY EXCHANGE;  Surgeon: Gearlean Alf, MD;  Location: [facility] ORS;  Service: Orthopedics;  Laterality: Right;  . INCISION AND DRAINAGE OF WOUND Right 01/14/2017   Procedure: IRRIGATION AND DEBRIDEMENT WOUND;  Surgeon: [name], MD;  Location: [facility] OR[facility];  Service: Orthopedics;  Laterality: Right;  requests 51mins  . JOINT REPLACEMENT  06/2014   right knee  . nasal cauterization  2012  . PATELLAR TENDON REPAIR Right 08/11/2014   Procedure: RIGHT PATELLA TENDON REPAIR;  Surgeon: [name]  V, MD;  Location: [facility] OR[facility];  Service: Orthopedics;  Laterality: Right;  . REIMPLANTATION OF TOTAL KNEE Right 05/23/2015   Procedure: RIGHT KNEE ARTHROPLASTY REIMPLANTATION;  Surgeon: [name], MD;  Location: [facility];  Service: Orthopedics;  Laterality: Right;  . TOTAL KNEE ARTHROPLASTY Right 07/03/2014   Procedure: RIGHT TOTAL KNEE ARTHROPLASTY;  Surgeon: Gearlean Alf, MD;  Location: [facility];  Service: Orthopedics;  Laterality: Right;  . TUBAL LIGATION      OB History      No data available       Home Medications    Prior to Admission medications   Medication Sig Start Date End Date Taking? Authorizing Provider  acetaminophen (TYLENOL) 500 MG tablet Take 1,000 mg by mouth every 6 (six) hours as needed for mild pain.    Historical Provider, MD  amLODipine (NORVASC) 10 MG tablet Take 1 tablet (10 mg total) by mouth every evening. 09/05/16   Claretta Fraise, MD  atorvastatin (LIPITOR) 20 MG tablet Take 1 tablet (20 mg total) by mouth every evening. 09/05/16   Claretta Fraise, MD  cephALEXin (KEFLEX) 500 MG capsule Take 1 capsule (500 mg total) by mouth 3 (three) times daily. 01/15/17   Alexzandrew L Perkins, PA-C  Cyanocobalamin (VITAMIN B 12 PO) Take 1 tablet by mouth daily.    Historical Provider, MD  metFORMIN (GLUCOPHAGE-XR) 500 MG 24 hr tablet TAKE ONE TABLET BY MOUTH ONCE DAILY WITH BREAKFAST Patient taking differently: Take 500 mg by mouth 2 (two) times daily.  11/19/16   Claretta Fraise, MD  methocarbamol (ROBAXIN) 500 MG tablet Take 1 tablet (500 mg total) by mouth every 6 (six) hours as needed for muscle spasms. 01/15/17   Alexzandrew L Perkins, PA-C  Multiple Vitamin (MULTIVITAMIN WITH MINERALS) TABS tablet Take 1 tablet by mouth every other day.    Historical Provider, MD  Nebivolol HCl (BYSTOLIC) 20 MG TABS Take 1 tablet (20 mg total) by mouth every evening. For blood pressure 04/21/16   Claretta Fraise, MD  olmesartan-hydrochlorothiazide (BENICAR HCT) 40-12.5 MG tablet Take 1 tablet by mouth daily. In the morning 09/05/16   Claretta Fraise, MD  ONE [facility] ULTRA TEST test strip  11/18/16   Historical Provider, MD  oxyCODONE (OXY IR/ROXICODONE) 5 MG immediate release tablet Take 1-2 tablets (5-10 mg total) by mouth every 4 (four) hours as needed for moderate pain or severe pain. 01/15/17   Alexzandrew L Perkins, PA-C  traMADol (ULTRAM) 50 MG tablet Take 1-2 tablets (50-100 mg total) by mouth every 6 (six) hours as needed for moderate pain. 01/15/17   Alexzandrew Monika Salk,  PA-C    Family History Family History  Problem Relation Age of Onset  . Ovarian cancer Mother   . Cancer Mother   . Diabetes Son   . Diabetes Son   . Peripheral vascular disease Father     with amputation of both legs  . Hypertension Father   . Heart disease Brother   . Kidney disease Daughter   . Colon cancer Neg Hx   . Esophageal cancer Neg Hx   . Stomach cancer Neg Hx   . Rectal cancer Neg Hx     Social History Social History  Substance Use Topics  . Smoking status: Never Smoker  . Smokeless tobacco: Never Used  . Alcohol use No     Allergies   Aleve [naproxen sodium]; Asa [aspirin]; Clonidine derivatives; and Shellfish allergy   Review of Systems Review of Systems  Constitutional: Negative for diaphoresis and  fever.  Gastrointestinal: Negative for abdominal pain, nausea and vomiting.  Genitourinary: Positive for dysuria, frequency, hematuria and urgency. Negative for flank pain.  Musculoskeletal: Negative for arthralgias and back pain.  All other systems reviewed and are negative.    Physical Exam Updated Vital Signs BP (!) 190/120 (BP Location: Left Arm)   Pulse 100   Temp 97.7 F (36.5 C) (Oral)   Resp 18   SpO2 93%   Physical Exam  Constitutional: She is oriented to person, place, and time. She appears well-developed.  HENT:  Head: Normocephalic and atraumatic.  Mouth/Throat: Oropharynx is clear and moist. No oropharyngeal exudate.  No stridor, trachea midline.   Eyes: Conjunctivae and EOM are normal. Pupils are equal, round, and reactive to light.  Neck: Normal range of motion. Neck supple.  Cardiovascular: Normal rate and regular rhythm.   Pulmonary/Chest: Effort normal and breath sounds normal.  Abdominal: Soft. Bowel sounds are normal. She exhibits no mass. There is no tenderness. There is no rebound and no guarding.  Hyperactive bowel sounds.   Musculoskeletal: Normal range of motion.  Intact distal pulses.   Neurological: She is alert  and oriented to person, place, and time.  Skin: Skin is warm and dry.  Incision of the right knee is dry, intact, and well healed.   Psychiatric: She has a normal mood and affect.     ED Treatments / Results  DIAGNOSTIC STUDIES: Oxygen Saturation is 93% on RA, adequate by my interpretation.    COORDINATION OF CARE: 11:24 PM Discussed treatment plan with pt at bedside and pt agreed to plan  Labs (all labs ordered are listed, but only abnormal results are displayed) Labs Reviewed  COMPREHENSIVE METABOLIC PANEL - Abnormal; Notable for the following:       Result Value   Glucose, Bld 104 (*)    BUN 22 (*)    Creatinine, Ser 1.07 (*)    Total Protein 8.5 (*)    ALT 11 (*)    GFR calc non Af Amer 50 (*)    GFR calc Af Amer 58 (*)    All other components within normal limits  CBC - Abnormal; Notable for the following:    RBC 5.16 (*)    MCH 25.4 (*)    RDW 15.6 (*)    All other components within normal limits  LIPASE, BLOOD  URINALYSIS, ROUTINE W REFLEX MICROSCOPIC    EKG  EKG Interpretation None       Procedures Procedures (including critical care time)  Medications Ordered in ED  Medications  phenazopyridine (PYRIDIUM) tablet 200 mg (not administered)  cephALEXin (KEFLEX) capsule 500 mg (not administered)  amLODipine (NORVASC) tablet 10 mg (not administered)     Final Clinical Impressions(s) / ED Diagnoses  UTI: I have reviewed the triage vital signs and the nursing notes. I do not believe this is a kidney stone.  No flank pain, no vomiting.  Symptoms are consistent with UTI  Pertinent labs were available during my care of the patient were reviewed by me and considered in my medical decision making.    After history, exam, and medical workup I feel the patient has been appropriately medically screened and is safe for discharge home. Pertinent diagnoses were discussed with the patient. Patient was given return precautions.  Return immediately for flank pain,  vomiting, intractable pain, inability to tolerate oral fluids, fevers, lightheadedness or any concerns.  Follow up your PMD within 2 days for recheck.    I personally performed  the services described in this documentation, which was scribed in my presence. The recorded information has been reviewed and is accurate.       Veatrice Kells, MD 02/12/17 [phone]

## 2017-02-11 NOTE — ED Triage Notes (Signed)
Pt with difficulty urinating x several days.  Pt with blood in urine starting today.  Abdominal pain.  No fever.

## 2017-02-11 NOTE — ED Notes (Signed)
ED Provider at bedside. 

## 2017-02-12 ENCOUNTER — Other Ambulatory Visit: Payer: Self-pay | Admitting: Family Medicine

## 2017-02-12 ENCOUNTER — Encounter (HOSPITAL_COMMUNITY): Payer: Self-pay | Admitting: Emergency Medicine

## 2017-02-12 LAB — CBG MONITORING, ED: Glucose-Capillary: 144 mg/dL — ABNORMAL HIGH (ref 65–99)

## 2017-02-12 MED ORDER — CEPHALEXIN 500 MG PO CAPS: 500.0000 mg | ORAL_CAPSULE | Freq: Four times a day (QID) | ORAL | 0 refills | Status: DC

## 2017-02-12 MED ORDER — CEPHALEXIN 500 MG PO CAPS
500.0000 mg | ORAL_CAPSULE | Freq: Once | ORAL | Status: AC
  Administered 2017-02-12: 500 mg via ORAL
  Filled 2017-02-12: qty 1

## 2017-02-12 MED ORDER — PHENAZOPYRIDINE HCL 200 MG PO TABS: 200.0000 mg | ORAL_TABLET | Freq: Three times a day (TID) | ORAL | 0 refills | Status: DC | PRN

## 2017-02-12 MED ORDER — AMLODIPINE BESYLATE 5 MG PO TABS: 10.0000 mg | ORAL_TABLET | Freq: Once | ORAL | Status: DC

## 2017-02-12 MED ORDER — PHENAZOPYRIDINE HCL 200 MG PO TABS
200.0000 mg | ORAL_TABLET | Freq: Three times a day (TID) | ORAL | Status: DC
  Administered 2017-02-12: 200 mg via ORAL
  Filled 2017-02-12: qty 1

## 2017-02-12 MED ORDER — ONDANSETRON 8 MG PO TBDP
8.0000 mg | ORAL_TABLET | Freq: Once | ORAL | Status: AC
  Administered 2017-02-12: 8 mg via ORAL
  Filled 2017-02-12: qty 1

## 2017-02-26 ENCOUNTER — Encounter: Payer: Self-pay | Admitting: Family Medicine

## 2017-02-26 ENCOUNTER — Ambulatory Visit (INDEPENDENT_AMBULATORY_CARE_PROVIDER_SITE_OTHER): Payer: Medicare Other | Admitting: Family Medicine

## 2017-02-26 VITALS — BP 184/92 | HR 61 | Temp 97.6°F | Ht 64.0 in | Wt 197.1 lb

## 2017-02-26 DIAGNOSIS — I10 Essential (primary) hypertension: Secondary | ICD-10-CM

## 2017-02-26 DIAGNOSIS — E782 Mixed hyperlipidemia: Secondary | ICD-10-CM | POA: Diagnosis not present

## 2017-02-26 DIAGNOSIS — E119 Type 2 diabetes mellitus without complications: Secondary | ICD-10-CM

## 2017-02-26 MED ORDER — HYDRALAZINE HCL 50 MG PO TABS: 50.0000 mg | ORAL_TABLET | Freq: Two times a day (BID) | ORAL | 1 refills | Status: DC

## 2017-02-26 NOTE — Progress Notes (Signed)
Subjective:  Patient ID: Beth [name], female    DOB: 05-17-1946  Age: 71 y.o. MRN: [id number]  CC: Diabetes (pt here today for routine follow up on her Diabetes and HTN)   HPI Beth [name] presents for  follow-up of hypertension. Patient has no history of headache chest pain or shortness of breath or recent cough. Patient also denies symptoms of TIA such as numbness weakness lateralizing. Patient checks  blood pressure at home and has multiple elevated readings recently. Patient denies side effects from  medication. States taking it regularly.  Patient also  in for follow-up of elevated cholesterol. Doing well without complaints on current medication. Denies side effects of statin including myalgia and arthralgia and nausea. Also in today for liver function testing. Currently no chest pain, shortness of breath or other cardiovascular related symptoms noted.  Follow-up of diabetes. Patient does check blood sugar at home. Readings run between 110 and 140 fasting. Log attached, reviewed Patient denies symptoms such as polyuria, polydipsia, excessive hunger, nausea No significant hypoglycemic spells noted. Medications as noted below. Taking them regularly without complication/adverse reaction being reported today.    History Beth [name] has a past medical history of Anemia; Arthritis; Blood transfusion without reported diagnosis (2012); Cataract; CKD (chronic kidney disease), stage III; CVA (cerebral infarction); Diabetes mellitus without complication ([place]); Family history of anesthesia complication; Gout; Heart murmur; Herpes infection (08-09-14); Hyperlipidemia; Hypertension; Nocturia; and Stroke ([place]) (2006).   She has a past surgical history that includes Carpal tunnel release (Right, 1983); colonscopy (June 21, 2014); nasal cauterization (2012); Total knee arthroplasty (Right, 07/03/2014); Patellar tendon repair (Right, 08/11/2014); Joint replacement (06/2014); I&D knee with poly exchange (Right,  10/02/2014); Excisional total knee arthroplasty with antibiotic spacers (Right, 02/16/2015); Reimplantation of total knee (Right, 05/23/2015); Abdominal hysterectomy (1983); Tubal ligation; and Incision and drainage of wound (Right, 01/14/2017).   Her family history includes Cancer in her mother; Diabetes in her son and son; Heart disease in her brother; Hypertension in her father; Kidney disease in her daughter; Ovarian cancer in her mother; Peripheral vascular disease in her father.She reports that she has never smoked. She has never used smokeless tobacco. She reports that she does not drink alcohol or use drugs.  Current Outpatient Prescriptions on File Prior to Visit  Medication Sig Dispense Refill  . acetaminophen (TYLENOL) 500 MG tablet Take 1,000 mg by mouth every 6 (six) hours as needed for mild pain.    Marland Kitchen amLODipine (NORVASC) 10 MG tablet Take 1 tablet (10 mg total) by mouth every evening. 90 tablet 1  . atorvastatin (LIPITOR) 20 MG tablet Take 1 tablet (20 mg total) by mouth every evening. 90 tablet 1  . cephALEXin (KEFLEX) 500 MG capsule Take 1 capsule (500 mg total) by mouth 4 (four) times daily. 28 capsule 0  . Cyanocobalamin (VITAMIN B 12 PO) Take 1 tablet by mouth daily.    . metFORMIN (GLUCOPHAGE-XR) 500 MG 24 hr tablet TAKE ONE TABLET BY MOUTH ONCE DAILY WITH BREAKFAST (Patient taking differently: Take 500 mg by mouth 2 (two) times daily. ) 90 tablet 0  . Multiple Vitamin (MULTIVITAMIN WITH MINERALS) TABS tablet Take 1 tablet by mouth every other day.    . Nebivolol HCl (BYSTOLIC) 20 MG TABS Take 1 tablet (20 mg total) by mouth every evening. For blood pressure 90 tablet 1  . olmesartan-hydrochlorothiazide (BENICAR HCT) 40-12.5 MG tablet Take 1 tablet by mouth daily. In the morning 30 tablet 3  . ONE TOUCH ULTRA TEST test strip  USE TO CHECK GLUCOSE TWICE DAILY 100 each 0  . traMADol (ULTRAM) 50 MG tablet Take 1-2 tablets (50-100 mg total) by mouth every 6 (six) hours as needed for  moderate pain. 56 tablet 0   Current Facility-Administered Medications on File Prior to Visit  Medication Dose Route Frequency Provider Last Rate Last Dose  . tranexamic acid (CYKLOKAPRON) 2,000 mg in sodium chloride 0.9 % 50 mL Topical Application  2,080 mg Topical Once [name], MD        ROS Review of Systems  Constitutional: Negative for activity change, appetite change and fever.  HENT: Negative for congestion, rhinorrhea and sore throat.   Eyes: Negative for visual disturbance.  Respiratory: Negative for cough and shortness of breath.   Cardiovascular: Negative for chest pain and palpitations.  Gastrointestinal: Negative for abdominal pain, diarrhea and nausea.  Genitourinary: Negative for dysuria.  Musculoskeletal: Negative for arthralgias and myalgias.    Objective:  BP (!) 184/92   Pulse 61   Temp 97.6 F (36.4 C) (Oral)   Ht 5' 4"  (1.626 m)   Wt 197 lb 2 oz (89.4 kg)   BMI 33.84 kg/m   BP Readings from Last 3 Encounters:  02/26/17 (!) 184/92  02/12/17 (!) 200/112  01/15/17 (!) 155/86    Wt Readings from Last 3 Encounters:  02/26/17 197 lb 2 oz (89.4 kg)  01/14/17 196 lb (88.9 kg)  01/08/17 196 lb 14.4 oz (89.3 kg)     Physical Exam  Constitutional: She is oriented to person, place, and time. She appears well-developed and well-nourished. No distress.  HENT:  Head: Normocephalic and atraumatic.  Right Ear: External ear normal.  Left Ear: External ear normal.  Nose: Nose normal.  Mouth/Throat: Oropharynx is clear and moist.  Eyes: Conjunctivae and EOM are normal. Pupils are equal, round, and reactive to light.  Neck: Normal range of motion. Neck supple. No thyromegaly present.  Cardiovascular: Normal rate, regular rhythm and normal heart sounds.   No murmur heard. Pulmonary/Chest: Effort normal and breath sounds normal. No respiratory distress. She has no wheezes. She has no rales.  Abdominal: Soft. Bowel sounds are normal. She exhibits no  distension. There is no tenderness.  Lymphadenopathy:    She has no cervical adenopathy.  Neurological: She is alert and oriented to person, place, and time. She has normal reflexes.  Skin: Skin is warm and dry.  Psychiatric: She has a normal mood and affect. Her behavior is normal. Judgment and thought content normal.    Lab Results  Component Value Date   HGBA1C 5.9 (H) 01/08/2017   HGBA1C 6.6 (H) 02/29/2016   HGBA1C 6.3 11/19/2015       Assessment & Plan:   Beth [name] was seen today for diabetes.  Diagnoses and all orders for this visit:  Controlled type 2 diabetes mellitus without complication, without long-term current use of insulin (HCC) -     CMP14+EGFR  Essential hypertension -     CMP14+EGFR  Mixed hyperlipidemia -     CMP14+EGFR -     Lipid panel  Other orders -     hydrALAZINE (APRESOLINE) 50 MG tablet; Take 1 tablet (50 mg total) by mouth 2 (two) times daily. For BP   I have discontinued Beth [name]'s oxyCODONE, phenazopyridine, and olmesartan. I am also having her start on hydrALAZINE. Additionally, I am having her maintain her acetaminophen, Nebivolol HCl, amLODipine, atorvastatin, olmesartan-hydrochlorothiazide, metFORMIN, multivitamin with minerals, Cyanocobalamin (VITAMIN B 12 PO), traMADol, cephALEXin, and ONE TOUCH ULTRA TEST.  Meds ordered this encounter  Medications  . hydrALAZINE (APRESOLINE) 50 MG tablet    Sig: Take 1 tablet (50 mg total) by mouth 2 (two) times daily. For BP    Dispense:  60 tablet    Refill:  1     Follow-up: Return in about 2 weeks (around 03/12/2017).  Claretta Fraise, M.D.

## 2017-02-27 LAB — CMP14+EGFR
A/G RATIO: 1.1 — AB (ref 1.2–2.2)
ALT: 10 IU/L (ref 0–32)
AST: 19 IU/L (ref 0–40)
Albumin: 4.1 g/dL (ref 3.5–4.8)
Alkaline Phosphatase: 107 IU/L (ref 39–117)
BUN/Creatinine Ratio: 30 — ABNORMAL HIGH (ref 12–28)
BUN: 26 mg/dL (ref 8–27)
Bilirubin Total: 0.5 mg/dL (ref 0.0–1.2)
CALCIUM: 9.7 mg/dL (ref 8.7–10.3)
CO2: 24 mmol/L (ref 18–29)
CREATININE: 0.87 mg/dL (ref 0.57–1.00)
Chloride: 98 mmol/L (ref 96–106)
GFR calc Af Amer: 76 mL/min/{1.73_m2} (ref >59)
GFR, EST NON AFRICAN AMERICAN: 66 mL/min/{1.73_m2} (ref >59)
GLOBULIN, TOTAL: 3.7 g/dL (ref 1.5–4.5)
Glucose: 76 mg/dL (ref 65–99)
Potassium: 4.5 mmol/L (ref 3.5–5.2)
Sodium: 139 mmol/L (ref 134–144)
TOTAL PROTEIN: 7.8 g/dL (ref 6.0–8.5)

## 2017-02-27 LAB — LIPID PANEL
CHOL/HDL RATIO: 2.8 ratio (ref 0.0–4.4)
Cholesterol, Total: 158 mg/dL (ref 100–199)
HDL: 57 mg/dL (ref >39)
LDL CALC: 84 mg/dL (ref 0–99)
Triglycerides: 83 mg/dL (ref 0–149)
VLDL CHOLESTEROL CAL: 17 mg/dL (ref 5–40)

## 2017-03-11 DIAGNOSIS — Z1239 Encounter for other screening for malignant neoplasm of breast: Secondary | ICD-10-CM | POA: Diagnosis not present

## 2017-03-11 DIAGNOSIS — Z01419 Encounter for gynecological examination (general) (routine) without abnormal findings: Secondary | ICD-10-CM | POA: Diagnosis not present

## 2017-03-24 DIAGNOSIS — L84 Corns and callosities: Secondary | ICD-10-CM | POA: Diagnosis not present

## 2017-03-24 DIAGNOSIS — E1142 Type 2 diabetes mellitus with diabetic polyneuropathy: Secondary | ICD-10-CM | POA: Diagnosis not present

## 2017-03-24 DIAGNOSIS — B351 Tinea unguium: Secondary | ICD-10-CM | POA: Diagnosis not present

## 2017-03-24 DIAGNOSIS — M79676 Pain in unspecified toe(s): Secondary | ICD-10-CM | POA: Diagnosis not present

## 2017-04-01 ENCOUNTER — Other Ambulatory Visit: Payer: Self-pay | Admitting: Family Medicine

## 2017-04-03 ENCOUNTER — Other Ambulatory Visit: Payer: Self-pay | Admitting: Family Medicine

## 2017-04-03 ENCOUNTER — Encounter: Payer: Self-pay | Admitting: Family Medicine

## 2017-04-03 ENCOUNTER — Ambulatory Visit (INDEPENDENT_AMBULATORY_CARE_PROVIDER_SITE_OTHER): Payer: Medicare Other | Admitting: Family Medicine

## 2017-04-03 VITALS — BP 178/81 | HR 67 | Temp 96.9°F | Ht 64.0 in | Wt 204.0 lb

## 2017-04-03 DIAGNOSIS — I10 Essential (primary) hypertension: Secondary | ICD-10-CM | POA: Diagnosis not present

## 2017-04-03 DIAGNOSIS — R809 Proteinuria, unspecified: Secondary | ICD-10-CM | POA: Diagnosis not present

## 2017-04-03 LAB — URINALYSIS
Bilirubin, UA: NEGATIVE
GLUCOSE, UA: NEGATIVE
KETONES UA: NEGATIVE
LEUKOCYTES UA: NEGATIVE
NITRITE UA: NEGATIVE
RBC, UA: NEGATIVE
Specific Gravity, UA: 1.015 (ref 1.005–1.030)
Urobilinogen, Ur: 0.2 mg/dL (ref 0.2–1.0)
pH, UA: 7 (ref 5.0–7.5)

## 2017-04-03 MED ORDER — HYDRALAZINE HCL 100 MG PO TABS: 100.0000 mg | ORAL_TABLET | Freq: Three times a day (TID) | ORAL | 1 refills | Status: DC

## 2017-04-03 NOTE — Progress Notes (Signed)
Subjective:  Patient ID: Beth [name], female    DOB: [birth year]  Age: 71 y.o. MRN: [id number]  CC: Blood Pressure Check (pt here to check BP and urine for protein as she had 3+ protein in her urine when the home nurse visited)   HPI Beth [name] presents for Concern that blood pressure is very high. Home nurse came out and found it to be 160/90. This is in spite of assuring me that she is taking all for her medications regularly. Home health also asked for update on her vaccines and assessment of her proteinuria. Patient denies side effects from the medicine currently. Patient has no history of headache chest pain or shortness of breath or recent cough. Patient also denies symptoms of TIA such as numbness weakness lateralizing.   History Beth [name] has a past medical history of Anemia; Arthritis; Blood transfusion without reported diagnosis (2012); Cataract; CKD (chronic kidney disease), stage III; CVA (cerebral infarction); Diabetes mellitus without complication ([place]); Family history of anesthesia complication; Gout; Heart murmur; Herpes infection (08-09-14); Hyperlipidemia; Hypertension; Nocturia; and Stroke ([place]) (2006).   She has a past surgical history that includes Carpal tunnel release (Right, 1983); colonscopy (June 21, 2014); nasal cauterization (2012); Total knee arthroplasty (Right, 07/03/2014); Patellar tendon repair (Right, 08/11/2014); Joint replacement (06/2014); I&D knee with poly exchange (Right, 10/02/2014); Excisional total knee arthroplasty with antibiotic spacers (Right, 02/16/2015); Reimplantation of total knee (Right, 05/23/2015); Abdominal hysterectomy (1983); Tubal ligation; and Incision and drainage of wound (Right, 01/14/2017).   Her family history includes Cancer in her mother; Diabetes in her son and son; Heart disease in her brother; Hypertension in her father; Kidney disease in her daughter; Ovarian cancer in her mother; Peripheral vascular disease in her father.She reports that  she has never smoked. She has never used smokeless tobacco. She reports that she does not drink alcohol or use drugs.    ROS Review of Systems  Objective:  BP (!) 178/81   Pulse 67   Temp (!) 96.9 F (36.1 C) (Oral)   Ht 5\' 4"  (1.626 m)   Wt 204 lb (92.5 kg)   BMI 35.02 kg/m   BP Readings from Last 3 Encounters:  04/03/17 (!) 178/81  02/26/17 (!) 184/92  02/12/17 (!) 200/112    Wt Readings from Last 3 Encounters:  04/03/17 204 lb (92.5 kg)  02/26/17 197 lb 2 oz (89.4 kg)  01/14/17 196 lb (88.9 kg)     Physical Exam    Assessment & Plan:   Beth [name] was seen today for blood pressure check.  Diagnoses and all orders for this visit:  Proteinuria, unspecified type -     Urinalysis  Accelerated hypertension -     Urinalysis  Other orders -     hydrALAZINE (APRESOLINE) 100 MG tablet; Take 1 tablet (100 mg total) by mouth 3 (three) times daily. For BP       I have discontinued Beth [name]'s cephALEXin. I have also changed her hydrALAZINE. Additionally, I am having her maintain her acetaminophen, Nebivolol HCl, amLODipine, atorvastatin, olmesartan-hydrochlorothiazide, metFORMIN, multivitamin with minerals, Cyanocobalamin (VITAMIN B 12 PO), traMADol, and ONE TOUCH ULTRA TEST.  Allergies as of 04/03/2017      Reactions   Aleve [naproxen Sodium]    Heart races   Asa [aspirin]    Nose bleeding   Clonidine Derivatives Other (See Comments)   Dizziness and weakness   Shellfish Allergy Nausea And Vomiting      Medication List  Accurate as of 04/03/17  4:16 PM. Always use your most recent med list.          acetaminophen 500 MG tablet Commonly known as:  TYLENOL Take 1,000 mg by mouth every 6 (six) hours as needed for mild pain.   amLODipine 10 MG tablet Commonly known as:  NORVASC Take 1 tablet (10 mg total) by mouth every evening.   atorvastatin 20 MG tablet Commonly known as:  LIPITOR Take 1 tablet (20 mg total) by mouth every evening.     hydrALAZINE 100 MG tablet Commonly known as:  APRESOLINE Take 1 tablet (100 mg total) by mouth 3 (three) times daily. For BP   metFORMIN 500 MG 24 hr tablet Commonly known as:  GLUCOPHAGE-XR TAKE ONE TABLET BY MOUTH ONCE DAILY WITH BREAKFAST   multivitamin with minerals Tabs tablet Take 1 tablet by mouth every other day.   Nebivolol HCl 20 MG Tabs Commonly known as:  BYSTOLIC Take 1 tablet (20 mg total) by mouth every evening. For blood pressure   olmesartan-hydrochlorothiazide 40-12.5 MG tablet Commonly known as:  BENICAR HCT Take 1 tablet by mouth daily. In the morning   ONE TOUCH ULTRA TEST test strip Generic drug:  glucose blood USE 1 STRIP TO CHECK GLUCOSE TWICE DAILY   traMADol 50 MG tablet Commonly known as:  ULTRAM Take 1-2 tablets (50-100 mg total) by mouth every 6 (six) hours as needed for moderate pain.   VITAMIN B 12 PO Take 1 tablet by mouth daily.        Follow-up: Return in about 2 weeks (around 04/17/2017).  Beth [name], M.D.

## 2017-04-19 ENCOUNTER — Other Ambulatory Visit: Payer: Self-pay | Admitting: Family Medicine

## 2017-04-22 ENCOUNTER — Encounter: Payer: Self-pay | Admitting: Family Medicine

## 2017-04-22 ENCOUNTER — Ambulatory Visit (INDEPENDENT_AMBULATORY_CARE_PROVIDER_SITE_OTHER): Payer: Medicare Other | Admitting: Family Medicine

## 2017-04-22 VITALS — BP 173/88 | HR 64 | Temp 97.4°F | Ht 64.0 in | Wt 205.5 lb

## 2017-04-22 DIAGNOSIS — I10 Essential (primary) hypertension: Secondary | ICD-10-CM

## 2017-04-22 NOTE — Progress Notes (Signed)
Subjective:  Patient ID: Beth [name], female    DOB: [birth year]  Age: 71 y.o. MRN: [id number]  CC: Blood Pressure Check (pt here today for a 2 week recheck after starting on a new BP medicine. She has her BP log with her and she is still having elevated BP's. Pt has not been taking her Hydralazine as she was confused.)   HPI Beth [name] presents for  follow-up of hypertension. Patient has no history of headache chest pain or shortness of breath or recent cough. Patient also denies symptoms of TIA such as numbness weakness lateralizing. Patient checks  blood pressure at homeReadings are not coming down like we had hoped.However after discussion it is apparent she is confused about meds and hasn't been takking them correctly. History Beth [name] has a past medical history of Anemia; Arthritis; Blood transfusion without reported diagnosis (2012); Cataract; CKD (chronic kidney disease), stage III; CVA (cerebral infarction); Diabetes mellitus without complication (Beth [name]); Family history of anesthesia complication; Gout; Heart murmur; Herpes infection (08-09-14); Hyperlipidemia; Hypertension; Nocturia; and Stroke (Beth [name]) (2006).   She has a past surgical history that includes Carpal tunnel release (Right, 1983); colonscopy (June 21, 2014); nasal cauterization (2012); Total knee arthroplasty (Right, 07/03/2014); Patellar tendon repair (Right, 08/11/2014); Joint replacement (06/2014); I&D knee with poly exchange (Right, 10/02/2014); Excisional total knee arthroplasty with antibiotic spacers (Right, 02/16/2015); Reimplantation of total knee (Right, 05/23/2015); Abdominal hysterectomy (1983); Tubal ligation; and Incision and drainage of wound (Right, 01/14/2017).   Her family history includes Cancer in her mother; Diabetes in her son and son; Heart disease in her brother; Hypertension in her father; Kidney disease in her daughter; Ovarian cancer in her mother; Peripheral vascular disease in her father.She reports that she has  never smoked. She has never used smokeless tobacco. She reports that she does not drink alcohol or use drugs.  Current Outpatient Prescriptions on File Prior to Visit  Medication Sig Dispense Refill  . acetaminophen (TYLENOL) 500 MG tablet Take 1,000 mg by mouth every 6 (six) hours as needed for mild pain.    Marland Kitchen amLODipine (NORVASC) 10 MG tablet Take 1 tablet (10 mg total) by mouth every evening. 90 tablet 1  . atorvastatin (LIPITOR) 20 MG tablet Take 1 tablet (20 mg total) by mouth every evening. 90 tablet 1  . Cyanocobalamin (VITAMIN B 12 PO) Take 1 tablet by mouth daily.    . metFORMIN (GLUCOPHAGE-XR) 500 MG 24 hr tablet TAKE ONE TABLET BY MOUTH ONCE DAILY WITH BREAKFAST 90 tablet 0  . Multiple Vitamin (MULTIVITAMIN WITH MINERALS) TABS tablet Take 1 tablet by mouth every other day.    . Nebivolol HCl (BYSTOLIC) 20 MG TABS Take 1 tablet (20 mg total) by mouth every evening. For blood pressure 90 tablet 1  . olmesartan (BENICAR) 40 MG tablet TAKE 1 TABLET BY MOUTH ONCE DAILY FOR BLOOD PRESSURE 30 tablet 5  . traMADol (ULTRAM) 50 MG tablet Take 1-2 tablets (50-100 mg total) by mouth every 6 (six) hours as needed for moderate pain. 56 tablet 0  . hydrALAZINE (APRESOLINE) 100 MG tablet Take 1 tablet (100 mg total) by mouth 3 (three) times daily. For BP (Patient not taking: Reported on 04/22/2017) 90 tablet 1   Current Facility-Administered Medications on File Prior to Visit  Medication Dose Route Frequency Provider Last Rate Last Dose  . tranexamic acid (CYKLOKAPRON) 2,000 mg in sodium chloride 0.9 % 50 mL Topical Application  0,347 mg Topical Once [name], MD  ROS Review of Systems  Constitutional: Negative for activity change, appetite change and fever.  HENT: Negative for congestion, rhinorrhea and sore throat.   Eyes: Negative for visual disturbance.  Respiratory: Negative for cough and shortness of breath.   Cardiovascular: Negative for chest pain and palpitations.    Gastrointestinal: Negative for abdominal pain, diarrhea and nausea.  Genitourinary: Negative for dysuria.  Musculoskeletal: Negative for arthralgias and myalgias.    Objective:  BP (!) 173/88   Pulse 64   Temp 97.4 F (36.3 C) (Oral)   Ht 5\' 4"  (1.626 m)   Wt 205 lb 8 oz (93.2 kg)   BMI 35.27 kg/m   BP Readings from Last 3 Encounters:  04/22/17 (!) 173/88  04/03/17 (!) 178/81  02/26/17 (!) 184/92    Wt Readings from Last 3 Encounters:  04/22/17 205 lb 8 oz (93.2 kg)  04/03/17 204 lb (92.5 kg)  02/26/17 197 lb 2 oz (89.4 kg)     Physical Exam  Constitutional: She is oriented to person, place, and time. She appears well-developed and well-nourished. No distress.  HENT:  Head: Normocephalic and atraumatic.  Eyes: Conjunctivae are normal. Pupils are equal, round, and reactive to light.  Neck: Normal range of motion. Neck supple. No thyromegaly present.  Cardiovascular: Normal rate, regular rhythm and normal heart sounds.   No murmur heard. Pulmonary/Chest: Effort normal and breath sounds normal. No respiratory distress. She has no wheezes. She has no rales.  Abdominal: Soft. There is no tenderness.  Musculoskeletal: Normal range of motion.  Lymphadenopathy:    She has no cervical adenopathy.  Neurological: She is alert and oriented to person, place, and time.  Skin: Skin is warm and dry.  Psychiatric: She has a normal mood and affect. Her behavior is normal. Judgment and thought content normal.      Assessment & Plan:   Beth [name] was seen today for blood pressure check.  Diagnoses and all orders for this visit:  Accelerated hypertension   I have discontinued [name]'s olmesartan-hydrochlorothiazide and ONE TOUCH ULTRA TEST. I am also having her maintain her acetaminophen, Nebivolol HCl, amLODipine, atorvastatin, multivitamin with minerals, Cyanocobalamin (VITAMIN B 12 PO), traMADol, olmesartan, hydrALAZINE, and metFORMIN.  No orders of the defined types were  placed in this encounter. Meds reviewed in detail. New med list created. Manually reviewed those with the patient and put stars 5. Appropriate blood pressure pills patient voiced understanding. She will bring all of her pill bottles with her to her next visit.  Allergies as of 04/22/2017      Reactions   Aleve [naproxen Sodium]    Heart races   Asa [aspirin]    Nose bleeding   Clonidine Derivatives Other (See Comments)   Dizziness and weakness   Shellfish Allergy Nausea And Vomiting      Medication List       Accurate as of 04/22/17 11:59 PM. Always use your most recent med list.          acetaminophen 500 MG tablet Commonly known as:  TYLENOL Take 1,000 mg by mouth every 6 (six) hours as needed for mild pain.   amLODipine 10 MG tablet Commonly known as:  NORVASC Take 1 tablet (10 mg total) by mouth every evening.   atorvastatin 20 MG tablet Commonly known as:  LIPITOR Take 1 tablet (20 mg total) by mouth every evening.   hydrALAZINE 100 MG tablet Commonly known as:  APRESOLINE Take 1 tablet (100 mg total) by mouth 3 (three) times  daily. For BP   metFORMIN 500 MG 24 hr tablet Commonly known as:  GLUCOPHAGE-XR TAKE ONE TABLET BY MOUTH ONCE DAILY WITH BREAKFAST   multivitamin with minerals Tabs tablet Take 1 tablet by mouth every other day.   Nebivolol HCl 20 MG Tabs Commonly known as:  BYSTOLIC Take 1 tablet (20 mg total) by mouth every evening. For blood pressure   olmesartan 40 MG tablet Commonly known as:  BENICAR TAKE 1 TABLET BY MOUTH ONCE DAILY FOR BLOOD PRESSURE   traMADol 50 MG tablet Commonly known as:  ULTRAM Take 1-2 tablets (50-100 mg total) by mouth every 6 (six) hours as needed for moderate pain.   VITAMIN B 12 PO Take 1 tablet by mouth daily.      Follow-up: Return in about 2 weeks (around 05/06/2017).  Claretta Fraise, M.D.

## 2017-05-06 ENCOUNTER — Ambulatory Visit: Payer: Medicare Other | Admitting: Family Medicine

## 2017-05-08 ENCOUNTER — Ambulatory Visit (INDEPENDENT_AMBULATORY_CARE_PROVIDER_SITE_OTHER): Payer: Medicare Other | Admitting: Family Medicine

## 2017-05-08 ENCOUNTER — Encounter: Payer: Self-pay | Admitting: Family Medicine

## 2017-05-08 VITALS — BP 168/80 | HR 64 | Temp 97.1°F | Ht 64.0 in | Wt 201.0 lb

## 2017-05-08 DIAGNOSIS — I10 Essential (primary) hypertension: Secondary | ICD-10-CM

## 2017-05-08 NOTE — Progress Notes (Signed)
Subjective:  Patient ID: Beth [name], female    DOB: 09-30-1946  Age: 71 y.o. MRN: [id number]  CC: Hypertension (pt here today for a 2 week recheck on her BP after adding another medication to her BP regimen. Pt forgot her BP log at home but states her BP's are coming down and getting better.)   HPI Beth [name] presents for  follow-up of hypertension. Patient has no history of headache chest pain or shortness of breath or recent cough. Has  increased meds as discussed. Meds brought in, reviewed. Denies any side effects of them including dizziness, drowsiness. History Beth [name] has a past medical history of Anemia; Arthritis; Blood transfusion without reported diagnosis (2012); Cataract; CKD (chronic kidney disease), stage III; CVA (cerebral infarction); Diabetes mellitus without complication ([name]); Family history of anesthesia complication; Gout; Heart murmur; Herpes infection (08-09-14); Hyperlipidemia; Hypertension; Nocturia; and Stroke ([place]) (2006).   She has a past surgical history that includes Carpal tunnel release (Right, 1983); colonscopy (June 21, 2014); nasal cauterization (2012); Total knee arthroplasty (Right, 07/03/2014); Patellar tendon repair (Right, 08/11/2014); Joint replacement (06/2014); I&D knee with poly exchange (Right, 10/02/2014); Excisional total knee arthroplasty with antibiotic spacers (Right, 02/16/2015); Reimplantation of total knee (Right, 05/23/2015); Abdominal hysterectomy (1983); Tubal ligation; and Incision and drainage of wound (Right, 01/14/2017).   Her family history includes Cancer in her mother; Diabetes in her son and son; Heart disease in her brother; Hypertension in her father; Kidney disease in her daughter; Ovarian cancer in her mother; Peripheral vascular disease in her father.She reports that she has never smoked. She has never used smokeless tobacco. She reports that she does not drink alcohol or use drugs.  Current Outpatient Prescriptions on File Prior to Visit   Medication Sig Dispense Refill  . acetaminophen (TYLENOL) 500 MG tablet Take 1,000 mg by mouth every 6 (six) hours as needed for mild pain.    Marland Kitchen amLODipine (NORVASC) 10 MG tablet Take 1 tablet (10 mg total) by mouth every evening. 90 tablet 1  . atorvastatin (LIPITOR) 20 MG tablet Take 1 tablet (20 mg total) by mouth every evening. 90 tablet 1  . Cyanocobalamin (VITAMIN B 12 PO) Take 1 tablet by mouth daily.    . hydrALAZINE (APRESOLINE) 100 MG tablet Take 1 tablet (100 mg total) by mouth 3 (three) times daily. For BP 90 tablet 1  . metFORMIN (GLUCOPHAGE-XR) 500 MG 24 hr tablet TAKE ONE TABLET BY MOUTH ONCE DAILY WITH BREAKFAST 90 tablet 0  . Multiple Vitamin (MULTIVITAMIN WITH MINERALS) TABS tablet Take 1 tablet by mouth every other day.    . Nebivolol HCl (BYSTOLIC) 20 MG TABS Take 1 tablet (20 mg total) by mouth every evening. For blood pressure 90 tablet 1  . olmesartan (BENICAR) 40 MG tablet TAKE 1 TABLET BY MOUTH ONCE DAILY FOR BLOOD PRESSURE 30 tablet 5  . traMADol (ULTRAM) 50 MG tablet Take 1-2 tablets (50-100 mg total) by mouth every 6 (six) hours as needed for moderate pain. 56 tablet 0   Current Facility-Administered Medications on File Prior to Visit  Medication Dose Route Frequency Provider Last Rate Last Dose  . tranexamic acid (CYKLOKAPRON) 2,000 mg in sodium chloride 0.9 % 50 mL Topical Application  1,740 mg Topical Once Aluisio, Pilar Plate, MD        ROS Review of Systems  Constitutional: Negative for activity change, appetite change and fever.  HENT: Negative for congestion, rhinorrhea and sore throat.   Eyes: Negative for visual disturbance.  Respiratory: Negative  for cough and shortness of breath.   Cardiovascular: Negative for chest pain and palpitations.  Gastrointestinal: Negative for abdominal pain, diarrhea and nausea.  Genitourinary: Negative for dysuria.  Musculoskeletal: Negative for arthralgias and myalgias.    Objective:  BP (!) 168/80   Pulse 64   Temp 97.1  F (36.2 C) (Oral)   Ht 5\' 4"  (1.626 m)   Wt 201 lb (91.2 kg)   BMI 34.50 kg/m   BP Readings from Last 3 Encounters:  05/08/17 (!) 168/80  04/22/17 (!) 173/88  04/03/17 (!) 178/81    Wt Readings from Last 3 Encounters:  05/08/17 201 lb (91.2 kg)  04/22/17 205 lb 8 oz (93.2 kg)  04/03/17 204 lb (92.5 kg)     Physical Exam  Constitutional: She is oriented to person, place, and time. She appears well-developed and well-nourished. No distress.  HENT:  Head: Normocephalic and atraumatic.  Right Ear: External ear normal.  Left Ear: External ear normal.  Nose: Nose normal.  Mouth/Throat: Oropharynx is clear and moist.  Eyes: Conjunctivae and EOM are normal. Pupils are equal, round, and reactive to light.  Neck: Normal range of motion. Neck supple. No thyromegaly present.  Cardiovascular: Normal rate, regular rhythm and normal heart sounds.   No murmur heard. Pulmonary/Chest: Effort normal and breath sounds normal. No respiratory distress. She has no wheezes. She has no rales.  Abdominal: Soft. Bowel sounds are normal. She exhibits no distension. There is no tenderness.  Lymphadenopathy:    She has no cervical adenopathy.  Neurological: She is alert and oriented to person, place, and time. She has normal reflexes.  Skin: Skin is warm and dry.  Psychiatric: She has a normal mood and affect. Her behavior is normal. Judgment and thought content normal.       No results found.  Assessment & Plan:   There are no diagnoses linked to this encounter. I am having Beth [name] maintain her acetaminophen, Nebivolol HCl, amLODipine, atorvastatin, multivitamin with minerals, Cyanocobalamin (VITAMIN B 12 PO), traMADol, olmesartan, hydrALAZINE, and metFORMIN.  No orders of the defined types were placed in this encounter.   Follow-up: Return in about 3 months (around 08/08/2017), or if symptoms worsen or fail to improve, for hypertension.  Beth [name], M.D.

## 2017-05-11 ENCOUNTER — Other Ambulatory Visit: Payer: Self-pay | Admitting: Family Medicine

## 2017-05-11 NOTE — Addendum Note (Signed)
Addendum  created 05/11/17 1053 by Shayana Hornstein, MD   Sign clinical note    

## 2017-06-09 ENCOUNTER — Other Ambulatory Visit: Payer: Self-pay | Admitting: Family Medicine

## 2017-07-06 ENCOUNTER — Other Ambulatory Visit: Payer: Self-pay | Admitting: Family Medicine

## 2017-07-07 DIAGNOSIS — L84 Corns and callosities: Secondary | ICD-10-CM | POA: Diagnosis not present

## 2017-07-07 DIAGNOSIS — B351 Tinea unguium: Secondary | ICD-10-CM | POA: Diagnosis not present

## 2017-07-07 DIAGNOSIS — E1142 Type 2 diabetes mellitus with diabetic polyneuropathy: Secondary | ICD-10-CM | POA: Diagnosis not present

## 2017-07-07 DIAGNOSIS — M79676 Pain in unspecified toe(s): Secondary | ICD-10-CM | POA: Diagnosis not present

## 2017-07-22 ENCOUNTER — Other Ambulatory Visit: Payer: Self-pay | Admitting: Family Medicine

## 2017-07-31 DIAGNOSIS — Z7984 Long term (current) use of oral hypoglycemic drugs: Secondary | ICD-10-CM | POA: Diagnosis not present

## 2017-07-31 DIAGNOSIS — Z886 Allergy status to analgesic agent status: Secondary | ICD-10-CM | POA: Diagnosis not present

## 2017-07-31 DIAGNOSIS — E785 Hyperlipidemia, unspecified: Secondary | ICD-10-CM | POA: Diagnosis not present

## 2017-07-31 DIAGNOSIS — R404 Transient alteration of awareness: Secondary | ICD-10-CM | POA: Diagnosis not present

## 2017-07-31 DIAGNOSIS — M545 Low back pain: Secondary | ICD-10-CM | POA: Diagnosis not present

## 2017-07-31 DIAGNOSIS — E119 Type 2 diabetes mellitus without complications: Secondary | ICD-10-CM | POA: Diagnosis not present

## 2017-07-31 DIAGNOSIS — Z79899 Other long term (current) drug therapy: Secondary | ICD-10-CM | POA: Diagnosis not present

## 2017-07-31 DIAGNOSIS — Z8673 Personal history of transient ischemic attack (TIA), and cerebral infarction without residual deficits: Secondary | ICD-10-CM | POA: Diagnosis not present

## 2017-07-31 DIAGNOSIS — M546 Pain in thoracic spine: Secondary | ICD-10-CM | POA: Diagnosis not present

## 2017-07-31 DIAGNOSIS — Z888 Allergy status to other drugs, medicaments and biological substances status: Secondary | ICD-10-CM | POA: Diagnosis not present

## 2017-07-31 DIAGNOSIS — I10 Essential (primary) hypertension: Secondary | ICD-10-CM | POA: Diagnosis not present

## 2017-07-31 DIAGNOSIS — R531 Weakness: Secondary | ICD-10-CM | POA: Diagnosis not present

## 2017-07-31 DIAGNOSIS — R0602 Shortness of breath: Secondary | ICD-10-CM | POA: Diagnosis not present

## 2017-08-01 DIAGNOSIS — Z8673 Personal history of transient ischemic attack (TIA), and cerebral infarction without residual deficits: Secondary | ICD-10-CM | POA: Diagnosis not present

## 2017-08-01 DIAGNOSIS — E119 Type 2 diabetes mellitus without complications: Secondary | ICD-10-CM | POA: Diagnosis not present

## 2017-08-01 DIAGNOSIS — I10 Essential (primary) hypertension: Secondary | ICD-10-CM | POA: Diagnosis not present

## 2017-08-01 DIAGNOSIS — M545 Low back pain: Secondary | ICD-10-CM | POA: Diagnosis not present

## 2017-08-01 DIAGNOSIS — W1830XA Fall on same level, unspecified, initial encounter: Secondary | ICD-10-CM | POA: Diagnosis not present

## 2017-08-06 DIAGNOSIS — M48061 Spinal stenosis, lumbar region without neurogenic claudication: Secondary | ICD-10-CM | POA: Diagnosis not present

## 2017-08-06 DIAGNOSIS — M4696 Unspecified inflammatory spondylopathy, lumbar region: Secondary | ICD-10-CM | POA: Diagnosis not present

## 2017-08-06 DIAGNOSIS — M47816 Spondylosis without myelopathy or radiculopathy, lumbar region: Secondary | ICD-10-CM | POA: Diagnosis not present

## 2017-08-07 DIAGNOSIS — Z8673 Personal history of transient ischemic attack (TIA), and cerebral infarction without residual deficits: Secondary | ICD-10-CM | POA: Diagnosis not present

## 2017-08-07 DIAGNOSIS — M549 Dorsalgia, unspecified: Secondary | ICD-10-CM | POA: Diagnosis not present

## 2017-08-07 DIAGNOSIS — Z7984 Long term (current) use of oral hypoglycemic drugs: Secondary | ICD-10-CM | POA: Diagnosis not present

## 2017-08-07 DIAGNOSIS — W19XXXD Unspecified fall, subsequent encounter: Secondary | ICD-10-CM | POA: Diagnosis not present

## 2017-08-07 DIAGNOSIS — L989 Disorder of the skin and subcutaneous tissue, unspecified: Secondary | ICD-10-CM | POA: Diagnosis not present

## 2017-08-07 DIAGNOSIS — E785 Hyperlipidemia, unspecified: Secondary | ICD-10-CM | POA: Diagnosis not present

## 2017-08-07 DIAGNOSIS — Z79891 Long term (current) use of opiate analgesic: Secondary | ICD-10-CM | POA: Diagnosis not present

## 2017-08-07 DIAGNOSIS — I10 Essential (primary) hypertension: Secondary | ICD-10-CM | POA: Diagnosis not present

## 2017-08-07 DIAGNOSIS — E119 Type 2 diabetes mellitus without complications: Secondary | ICD-10-CM | POA: Diagnosis not present

## 2017-08-11 ENCOUNTER — Ambulatory Visit: Payer: Medicare Other | Admitting: Family Medicine

## 2017-08-11 DIAGNOSIS — I10 Essential (primary) hypertension: Secondary | ICD-10-CM | POA: Diagnosis not present

## 2017-08-11 DIAGNOSIS — W19XXXD Unspecified fall, subsequent encounter: Secondary | ICD-10-CM | POA: Diagnosis not present

## 2017-08-11 DIAGNOSIS — L989 Disorder of the skin and subcutaneous tissue, unspecified: Secondary | ICD-10-CM | POA: Diagnosis not present

## 2017-08-11 DIAGNOSIS — Z7984 Long term (current) use of oral hypoglycemic drugs: Secondary | ICD-10-CM | POA: Diagnosis not present

## 2017-08-11 DIAGNOSIS — E119 Type 2 diabetes mellitus without complications: Secondary | ICD-10-CM | POA: Diagnosis not present

## 2017-08-11 DIAGNOSIS — Z79891 Long term (current) use of opiate analgesic: Secondary | ICD-10-CM | POA: Diagnosis not present

## 2017-08-11 DIAGNOSIS — M549 Dorsalgia, unspecified: Secondary | ICD-10-CM | POA: Diagnosis not present

## 2017-08-11 DIAGNOSIS — Z8673 Personal history of transient ischemic attack (TIA), and cerebral infarction without residual deficits: Secondary | ICD-10-CM | POA: Diagnosis not present

## 2017-08-11 DIAGNOSIS — E785 Hyperlipidemia, unspecified: Secondary | ICD-10-CM | POA: Diagnosis not present

## 2017-08-12 ENCOUNTER — Encounter: Payer: Self-pay | Admitting: Family Medicine

## 2017-08-12 ENCOUNTER — Telehealth: Payer: Self-pay | Admitting: Family Medicine

## 2017-08-13 DIAGNOSIS — L989 Disorder of the skin and subcutaneous tissue, unspecified: Secondary | ICD-10-CM | POA: Diagnosis not present

## 2017-08-13 DIAGNOSIS — E785 Hyperlipidemia, unspecified: Secondary | ICD-10-CM | POA: Diagnosis not present

## 2017-08-13 DIAGNOSIS — Z8673 Personal history of transient ischemic attack (TIA), and cerebral infarction without residual deficits: Secondary | ICD-10-CM | POA: Diagnosis not present

## 2017-08-13 DIAGNOSIS — I10 Essential (primary) hypertension: Secondary | ICD-10-CM | POA: Diagnosis not present

## 2017-08-13 DIAGNOSIS — W19XXXD Unspecified fall, subsequent encounter: Secondary | ICD-10-CM | POA: Diagnosis not present

## 2017-08-13 DIAGNOSIS — M549 Dorsalgia, unspecified: Secondary | ICD-10-CM | POA: Diagnosis not present

## 2017-08-13 DIAGNOSIS — Z79891 Long term (current) use of opiate analgesic: Secondary | ICD-10-CM | POA: Diagnosis not present

## 2017-08-13 DIAGNOSIS — E119 Type 2 diabetes mellitus without complications: Secondary | ICD-10-CM | POA: Diagnosis not present

## 2017-08-13 DIAGNOSIS — Z7984 Long term (current) use of oral hypoglycemic drugs: Secondary | ICD-10-CM | POA: Diagnosis not present

## 2017-08-14 DIAGNOSIS — W19XXXD Unspecified fall, subsequent encounter: Secondary | ICD-10-CM | POA: Diagnosis not present

## 2017-08-14 DIAGNOSIS — E119 Type 2 diabetes mellitus without complications: Secondary | ICD-10-CM | POA: Diagnosis not present

## 2017-08-14 DIAGNOSIS — E785 Hyperlipidemia, unspecified: Secondary | ICD-10-CM | POA: Diagnosis not present

## 2017-08-14 DIAGNOSIS — Z79891 Long term (current) use of opiate analgesic: Secondary | ICD-10-CM | POA: Diagnosis not present

## 2017-08-14 DIAGNOSIS — M549 Dorsalgia, unspecified: Secondary | ICD-10-CM | POA: Diagnosis not present

## 2017-08-14 DIAGNOSIS — L989 Disorder of the skin and subcutaneous tissue, unspecified: Secondary | ICD-10-CM | POA: Diagnosis not present

## 2017-08-14 DIAGNOSIS — Z7984 Long term (current) use of oral hypoglycemic drugs: Secondary | ICD-10-CM | POA: Diagnosis not present

## 2017-08-14 DIAGNOSIS — Z8673 Personal history of transient ischemic attack (TIA), and cerebral infarction without residual deficits: Secondary | ICD-10-CM | POA: Diagnosis not present

## 2017-08-14 DIAGNOSIS — I10 Essential (primary) hypertension: Secondary | ICD-10-CM | POA: Diagnosis not present

## 2017-08-17 ENCOUNTER — Other Ambulatory Visit: Payer: Self-pay | Admitting: Neurosurgery

## 2017-08-17 DIAGNOSIS — M47816 Spondylosis without myelopathy or radiculopathy, lumbar region: Secondary | ICD-10-CM

## 2017-08-18 DIAGNOSIS — W19XXXD Unspecified fall, subsequent encounter: Secondary | ICD-10-CM | POA: Diagnosis not present

## 2017-08-18 DIAGNOSIS — Z8673 Personal history of transient ischemic attack (TIA), and cerebral infarction without residual deficits: Secondary | ICD-10-CM | POA: Diagnosis not present

## 2017-08-18 DIAGNOSIS — L989 Disorder of the skin and subcutaneous tissue, unspecified: Secondary | ICD-10-CM | POA: Diagnosis not present

## 2017-08-18 DIAGNOSIS — Z79891 Long term (current) use of opiate analgesic: Secondary | ICD-10-CM | POA: Diagnosis not present

## 2017-08-18 DIAGNOSIS — I10 Essential (primary) hypertension: Secondary | ICD-10-CM | POA: Diagnosis not present

## 2017-08-18 DIAGNOSIS — E785 Hyperlipidemia, unspecified: Secondary | ICD-10-CM | POA: Diagnosis not present

## 2017-08-18 DIAGNOSIS — E119 Type 2 diabetes mellitus without complications: Secondary | ICD-10-CM | POA: Diagnosis not present

## 2017-08-18 DIAGNOSIS — M549 Dorsalgia, unspecified: Secondary | ICD-10-CM | POA: Diagnosis not present

## 2017-08-18 DIAGNOSIS — Z7984 Long term (current) use of oral hypoglycemic drugs: Secondary | ICD-10-CM | POA: Diagnosis not present

## 2017-08-19 DIAGNOSIS — M549 Dorsalgia, unspecified: Secondary | ICD-10-CM | POA: Diagnosis not present

## 2017-08-19 DIAGNOSIS — Z79891 Long term (current) use of opiate analgesic: Secondary | ICD-10-CM | POA: Diagnosis not present

## 2017-08-19 DIAGNOSIS — L989 Disorder of the skin and subcutaneous tissue, unspecified: Secondary | ICD-10-CM | POA: Diagnosis not present

## 2017-08-19 DIAGNOSIS — Z8673 Personal history of transient ischemic attack (TIA), and cerebral infarction without residual deficits: Secondary | ICD-10-CM | POA: Diagnosis not present

## 2017-08-19 DIAGNOSIS — I10 Essential (primary) hypertension: Secondary | ICD-10-CM | POA: Diagnosis not present

## 2017-08-19 DIAGNOSIS — E119 Type 2 diabetes mellitus without complications: Secondary | ICD-10-CM | POA: Diagnosis not present

## 2017-08-19 DIAGNOSIS — W19XXXD Unspecified fall, subsequent encounter: Secondary | ICD-10-CM | POA: Diagnosis not present

## 2017-08-19 DIAGNOSIS — E785 Hyperlipidemia, unspecified: Secondary | ICD-10-CM | POA: Diagnosis not present

## 2017-08-19 DIAGNOSIS — Z7984 Long term (current) use of oral hypoglycemic drugs: Secondary | ICD-10-CM | POA: Diagnosis not present

## 2017-08-20 DIAGNOSIS — E785 Hyperlipidemia, unspecified: Secondary | ICD-10-CM | POA: Diagnosis not present

## 2017-08-20 DIAGNOSIS — E119 Type 2 diabetes mellitus without complications: Secondary | ICD-10-CM | POA: Diagnosis not present

## 2017-08-20 DIAGNOSIS — Z7984 Long term (current) use of oral hypoglycemic drugs: Secondary | ICD-10-CM | POA: Diagnosis not present

## 2017-08-20 DIAGNOSIS — M549 Dorsalgia, unspecified: Secondary | ICD-10-CM | POA: Diagnosis not present

## 2017-08-20 DIAGNOSIS — Z8673 Personal history of transient ischemic attack (TIA), and cerebral infarction without residual deficits: Secondary | ICD-10-CM | POA: Diagnosis not present

## 2017-08-20 DIAGNOSIS — Z79891 Long term (current) use of opiate analgesic: Secondary | ICD-10-CM | POA: Diagnosis not present

## 2017-08-20 DIAGNOSIS — W19XXXD Unspecified fall, subsequent encounter: Secondary | ICD-10-CM | POA: Diagnosis not present

## 2017-08-20 DIAGNOSIS — I10 Essential (primary) hypertension: Secondary | ICD-10-CM | POA: Diagnosis not present

## 2017-08-20 DIAGNOSIS — L989 Disorder of the skin and subcutaneous tissue, unspecified: Secondary | ICD-10-CM | POA: Diagnosis not present

## 2017-08-21 ENCOUNTER — Ambulatory Visit: Payer: Medicare Other | Admitting: Family Medicine

## 2017-08-21 ENCOUNTER — Telehealth: Payer: Self-pay

## 2017-08-21 DIAGNOSIS — M549 Dorsalgia, unspecified: Secondary | ICD-10-CM | POA: Diagnosis not present

## 2017-08-21 MED ORDER — ATORVASTATIN CALCIUM 20 MG PO TABS: 20.0000 mg | ORAL_TABLET | Freq: Every evening | ORAL | 0 refills | Status: DC

## 2017-08-24 DIAGNOSIS — I10 Essential (primary) hypertension: Secondary | ICD-10-CM | POA: Diagnosis not present

## 2017-08-24 DIAGNOSIS — M549 Dorsalgia, unspecified: Secondary | ICD-10-CM | POA: Diagnosis not present

## 2017-08-24 DIAGNOSIS — L989 Disorder of the skin and subcutaneous tissue, unspecified: Secondary | ICD-10-CM | POA: Diagnosis not present

## 2017-08-24 DIAGNOSIS — Z79891 Long term (current) use of opiate analgesic: Secondary | ICD-10-CM | POA: Diagnosis not present

## 2017-08-24 DIAGNOSIS — Z7984 Long term (current) use of oral hypoglycemic drugs: Secondary | ICD-10-CM | POA: Diagnosis not present

## 2017-08-24 DIAGNOSIS — E119 Type 2 diabetes mellitus without complications: Secondary | ICD-10-CM | POA: Diagnosis not present

## 2017-08-24 DIAGNOSIS — W19XXXD Unspecified fall, subsequent encounter: Secondary | ICD-10-CM | POA: Diagnosis not present

## 2017-08-24 DIAGNOSIS — Z8673 Personal history of transient ischemic attack (TIA), and cerebral infarction without residual deficits: Secondary | ICD-10-CM | POA: Diagnosis not present

## 2017-08-24 DIAGNOSIS — E785 Hyperlipidemia, unspecified: Secondary | ICD-10-CM | POA: Diagnosis not present

## 2017-08-24 NOTE — Telephone Encounter (Signed)
x

## 2017-08-28 ENCOUNTER — Encounter: Payer: Self-pay | Admitting: Family Medicine

## 2017-08-28 ENCOUNTER — Ambulatory Visit (INDEPENDENT_AMBULATORY_CARE_PROVIDER_SITE_OTHER): Payer: Medicare Other | Admitting: Family Medicine

## 2017-08-28 VITALS — BP 139/76 | HR 65 | Temp 97.0°F | Ht 64.0 in | Wt 205.0 lb

## 2017-08-28 DIAGNOSIS — E782 Mixed hyperlipidemia: Secondary | ICD-10-CM

## 2017-08-28 DIAGNOSIS — I10 Essential (primary) hypertension: Secondary | ICD-10-CM | POA: Diagnosis not present

## 2017-08-28 DIAGNOSIS — D509 Iron deficiency anemia, unspecified: Secondary | ICD-10-CM

## 2017-08-28 DIAGNOSIS — E119 Type 2 diabetes mellitus without complications: Secondary | ICD-10-CM | POA: Diagnosis not present

## 2017-08-28 LAB — BAYER DCA HB A1C WAIVED: HB A1C (BAYER DCA - WAIVED): 6.3 % (ref <7.0)

## 2017-08-28 MED ORDER — METFORMIN HCL ER 500 MG PO TB24: ORAL_TABLET | ORAL | 0 refills | Status: DC

## 2017-08-28 MED ORDER — ATORVASTATIN CALCIUM 20 MG PO TABS: 20.0000 mg | ORAL_TABLET | Freq: Every evening | ORAL | 0 refills | Status: DC

## 2017-08-28 MED ORDER — OLMESARTAN MEDOXOMIL 40 MG PO TABS: ORAL_TABLET | ORAL | 0 refills | Status: DC

## 2017-08-28 MED ORDER — NEBIVOLOL HCL 20 MG PO TABS: ORAL_TABLET | ORAL | 5 refills | Status: DC

## 2017-08-28 MED ORDER — AMLODIPINE BESYLATE 10 MG PO TABS: ORAL_TABLET | ORAL | 1 refills | Status: DC

## 2017-08-28 NOTE — Addendum Note (Signed)
Addended by: Marylin Crosby on: 08/28/2017 04:22 PM   Modules accepted: Orders

## 2017-08-28 NOTE — Progress Notes (Signed)
Subjective:  Patient ID: Beth [name],  female    DOB: 11/16/1946  Age: 71 y.o.    CC: Hypertension (pt here today for routine follow up of her chronic medical conditions)   HPI Beth [name] presents for  follow-up of hypertension. Patient has no history of headache chest pain or shortness of breath or recent cough. Patient also denies symptoms of TIA such as numbness weakness lateralizing. Patient checks  blood pressure at home. Recent readings have been frequently over 409 systolic .Patient denies side effects from medication. States taking it regularly.  Patient also  in for follow-up of elevated cholesterol. Doing well without complaints on current medication. Denies side effects of statin including myalgia and arthralgia and nausea. Also in today for liver function testing. Currently no chest pain, shortness of breath or other cardiovascular related symptoms noted.  Follow-up of diabetes. Patient does check blood sugar at home. Readings run between 110 and 160 Patient denies symptoms such as polyuria, polydipsia, excessive hunger, nausea No significant hypoglycemic spells noted. Medications reviewed. Pt reports taking them regularly. Pt. denies complication/adverse reaction today.    History Beth [name] has a past medical history of Anemia; Arthritis; Blood transfusion without reported diagnosis (2012); Cataract; CKD (chronic kidney disease), stage III; CVA (cerebral infarction); Diabetes mellitus without complication (Beth [name]); Family history of anesthesia complication; Gout; Heart murmur; Herpes infection (08-09-14); Hyperlipidemia; Hypertension; Nocturia; and Stroke (Beth [name]) (2006).   She has a past surgical history that includes Carpal tunnel release (Right, 1983); colonscopy (June 21, 2014); nasal cauterization (2012); Total knee arthroplasty (Right, 07/03/2014); Patellar tendon repair (Right, 08/11/2014); Joint replacement (06/2014); I&D knee with poly exchange (Right, 10/02/2014); Excisional  total knee arthroplasty with antibiotic spacers (Right, 02/16/2015); Reimplantation of total knee (Right, 05/23/2015); Abdominal hysterectomy (1983); Tubal ligation; and Incision and drainage of wound (Right, 01/14/2017).   Her family history includes Cancer in her mother; Diabetes in her son and son; Heart disease in her brother; Hypertension in her father; Kidney disease in her daughter; Ovarian cancer in her mother; Peripheral vascular disease in her father.She reports that she has never smoked. She has never used smokeless tobacco. She reports that she does not drink alcohol or use drugs.  Current Outpatient Prescriptions on File Prior to Visit  Medication Sig Dispense Refill  . acetaminophen (TYLENOL) 500 MG tablet Take 1,000 mg by mouth every 6 (six) hours as needed for mild pain.    Marland Kitchen amLODipine (NORVASC) 10 MG tablet TAKE ONE TABLET BY MOUTH ONCE DAILY IN THE EVENING 90 tablet 1  . atorvastatin (LIPITOR) 20 MG tablet Take 1 tablet (20 mg total) by mouth every evening. 90 tablet 0  . BYSTOLIC 20 MG TABS TAKE ONE TABLET BY MOUTH IN THE EVENING FOR BLOOD PRESSURE 30 tablet 5  . hydrALAZINE (APRESOLINE) 100 MG tablet TAKE 1 TABLET BY MOUTH THREE TIMES DAILY FOR BLOOD PRESSURE 90 tablet 1  . metFORMIN (GLUCOPHAGE-XR) 500 MG 24 hr tablet TAKE 1 TABLET BY MOUTH ONCE DAILY WITH BREAKFAST 90 tablet 0  . Multiple Vitamin (MULTIVITAMIN WITH MINERALS) TABS tablet Take 1 tablet by mouth every other day.    . olmesartan (BENICAR) 40 MG tablet TAKE 1 TABLET BY MOUTH ONCE DAILY FOR BLOOD PRESSURE 90 tablet 0   Current Facility-Administered Medications on File Prior to Visit  Medication Dose Route Frequency Provider Last Rate Last Dose  . tranexamic acid (CYKLOKAPRON) 2,000 mg in sodium chloride 0.9 % 50 mL Topical Application  8,119 mg Topical Once [name], MD  ROS Review of Systems  Constitutional: Negative for activity change, appetite change and fever.  HENT: Negative for congestion,  rhinorrhea and sore throat.   Eyes: Negative for visual disturbance.  Respiratory: Negative for cough and shortness of breath.   Cardiovascular: Negative for chest pain and palpitations.  Gastrointestinal: Negative for abdominal pain, diarrhea and nausea.  Genitourinary: Negative for dysuria.  Musculoskeletal: Positive for gait problem (fell twice last month when she tried not to use her walker). Negative for arthralgias and myalgias.    Objective:  BP 139/76   Pulse 65   Temp (!) 97 F (36.1 C) (Oral)   Ht 5\' 4"  (1.626 m)   Wt 205 lb (93 kg)   BMI 35.19 kg/m   BP Readings from Last 3 Encounters:  08/28/17 139/76  05/08/17 (!) 168/80  04/22/17 (!) 173/88    Wt Readings from Last 3 Encounters:  08/28/17 205 lb (93 kg)  05/08/17 201 lb (91.2 kg)  04/22/17 205 lb 8 oz (93.2 kg)     Physical Exam  Diabetic Foot Exam - Simple   Simple Foot Form Diabetic Foot exam was performed with the following findings:  Yes 08/28/2017  4:15 PM  Visual Inspection No deformities, no ulcerations, no other skin breakdown bilaterally:  Yes Sensation Testing Intact to touch and monofilament testing bilaterally:  Yes Pulse Check Posterior Tibialis and Dorsalis pulse intact bilaterally:  Yes Comments       Assessment & Plan:   Beth [name] was seen today for hypertension.  Diagnoses and all orders for this visit:  Controlled type 2 diabetes mellitus without complication, without long-term current use of insulin (HCC)  Essential hypertension  Mixed hyperlipidemia  Iron deficiency anemia, unspecified iron deficiency anemia type   I have discontinued Ms. Beth [name]'s Cyanocobalamin (VITAMIN B 12 PO) and traMADol. I am also having her maintain her acetaminophen, multivitamin with minerals, BYSTOLIC, hydrALAZINE, amLODipine, olmesartan, metFORMIN, atorvastatin, carisoprodol, and HYDROcodone-acetaminophen.  Meds ordered this encounter  Medications  . carisoprodol (SOMA) 350 MG tablet    Sig:  Take 350 mg by mouth 3 (three) times daily.    Refill:  0  . HYDROcodone-acetaminophen (NORCO) 10-325 MG tablet    Sig: TAKE 1 TABLET BY MOUTH 3 TIMES A DAY AS NEEDED FOR PAIN    Refill:  0     Follow-up: Return in about 3 months (around 11/27/2017).  Beth [name], M.D.

## 2017-08-29 LAB — CBC WITH DIFFERENTIAL/PLATELET
BASOS: 1 %
Basophils Absolute: 0 10*3/uL (ref 0.0–0.2)
EOS (ABSOLUTE): 0.1 10*3/uL (ref 0.0–0.4)
Eos: 2 %
HEMATOCRIT: 37.8 % (ref 34.0–46.6)
HEMOGLOBIN: 11.9 g/dL (ref 11.1–15.9)
IMMATURE GRANS (ABS): 0 10*3/uL (ref 0.0–0.1)
Immature Granulocytes: 0 %
LYMPHS: 25 %
Lymphocytes Absolute: 1.8 10*3/uL (ref 0.7–3.1)
MCH: 25.3 pg — AB (ref 26.6–33.0)
MCHC: 31.5 g/dL (ref 31.5–35.7)
MCV: 80 fL (ref 79–97)
MONOCYTES: 10 %
Monocytes Absolute: 0.7 10*3/uL (ref 0.1–0.9)
NEUTROS ABS: 4.4 10*3/uL (ref 1.4–7.0)
Neutrophils: 62 %
PLATELETS: 398 10*3/uL — AB (ref 150–379)
RBC: 4.71 x10E6/uL (ref 3.77–5.28)
RDW: 16 % — ABNORMAL HIGH (ref 12.3–15.4)
WBC: 7.2 10*3/uL (ref 3.4–10.8)

## 2017-08-29 LAB — CMP14+EGFR
A/G RATIO: 1.3 (ref 1.2–2.2)
ALK PHOS: 108 IU/L (ref 39–117)
ALT: 9 IU/L (ref 0–32)
AST: 15 IU/L (ref 0–40)
Albumin: 4.5 g/dL (ref 3.5–4.8)
BILIRUBIN TOTAL: 0.5 mg/dL (ref 0.0–1.2)
BUN/Creatinine Ratio: 25 (ref 12–28)
BUN: 25 mg/dL (ref 8–27)
CHLORIDE: 103 mmol/L (ref 96–106)
CO2: 21 mmol/L (ref 20–29)
Calcium: 9.9 mg/dL (ref 8.7–10.3)
Creatinine, Ser: 1.01 mg/dL — ABNORMAL HIGH (ref 0.57–1.00)
GFR calc Af Amer: 64 mL/min/{1.73_m2} (ref >59)
GFR calc non Af Amer: 55 mL/min/{1.73_m2} — ABNORMAL LOW (ref >59)
GLUCOSE: 84 mg/dL (ref 65–99)
Globulin, Total: 3.6 g/dL (ref 1.5–4.5)
POTASSIUM: 4.5 mmol/L (ref 3.5–5.2)
Sodium: 144 mmol/L (ref 134–144)
Total Protein: 8.1 g/dL (ref 6.0–8.5)

## 2017-08-29 LAB — LIPID PANEL
CHOLESTEROL TOTAL: 162 mg/dL (ref 100–199)
Chol/HDL Ratio: 2.8 ratio (ref 0.0–4.4)
HDL: 58 mg/dL (ref >39)
LDL Calculated: 78 mg/dL (ref 0–99)
TRIGLYCERIDES: 128 mg/dL (ref 0–149)
VLDL Cholesterol Cal: 26 mg/dL (ref 5–40)

## 2017-09-01 DIAGNOSIS — Z79891 Long term (current) use of opiate analgesic: Secondary | ICD-10-CM | POA: Diagnosis not present

## 2017-09-01 DIAGNOSIS — E119 Type 2 diabetes mellitus without complications: Secondary | ICD-10-CM | POA: Diagnosis not present

## 2017-09-01 DIAGNOSIS — E785 Hyperlipidemia, unspecified: Secondary | ICD-10-CM | POA: Diagnosis not present

## 2017-09-01 DIAGNOSIS — W19XXXD Unspecified fall, subsequent encounter: Secondary | ICD-10-CM | POA: Diagnosis not present

## 2017-09-01 DIAGNOSIS — I10 Essential (primary) hypertension: Secondary | ICD-10-CM | POA: Diagnosis not present

## 2017-09-01 DIAGNOSIS — L989 Disorder of the skin and subcutaneous tissue, unspecified: Secondary | ICD-10-CM | POA: Diagnosis not present

## 2017-09-01 DIAGNOSIS — Z7984 Long term (current) use of oral hypoglycemic drugs: Secondary | ICD-10-CM | POA: Diagnosis not present

## 2017-09-01 DIAGNOSIS — Z8673 Personal history of transient ischemic attack (TIA), and cerebral infarction without residual deficits: Secondary | ICD-10-CM | POA: Diagnosis not present

## 2017-09-01 DIAGNOSIS — M549 Dorsalgia, unspecified: Secondary | ICD-10-CM | POA: Diagnosis not present

## 2017-09-02 ENCOUNTER — Ambulatory Visit
Admission: RE | Admit: 2017-09-02 | Discharge: 2017-09-02 | Disposition: A | Discharge status: Home / Self Care | Payer: Medicare Other | Source: Ambulatory Visit | Attending: Neurosurgery | Admitting: Neurosurgery

## 2017-09-02 ENCOUNTER — Other Ambulatory Visit: Payer: Self-pay | Admitting: Neurosurgery

## 2017-09-02 DIAGNOSIS — M47816 Spondylosis without myelopathy or radiculopathy, lumbar region: Secondary | ICD-10-CM | POA: Diagnosis not present

## 2017-09-02 DIAGNOSIS — M47817 Spondylosis without myelopathy or radiculopathy, lumbosacral region: Secondary | ICD-10-CM | POA: Diagnosis not present

## 2017-09-02 MED ORDER — IOPAMIDOL (ISOVUE-M 200) INJECTION 41%
1.0000 mL | Freq: Once | INTRAMUSCULAR | Status: AC
  Administered 2017-09-02: 1 mL via INTRA_ARTICULAR

## 2017-09-02 MED ORDER — METHYLPREDNISOLONE ACETATE 40 MG/ML INJ SUSP (RADIOLOG
120.0000 mg | Freq: Once | INTRAMUSCULAR | Status: AC
  Administered 2017-09-02: 120 mg via INTRA_ARTICULAR

## 2017-09-02 NOTE — Discharge Instructions (Signed)

## 2017-09-08 ENCOUNTER — Ambulatory Visit: Payer: Medicare Other

## 2017-09-15 DIAGNOSIS — M549 Dorsalgia, unspecified: Secondary | ICD-10-CM | POA: Diagnosis not present

## 2017-09-15 DIAGNOSIS — L989 Disorder of the skin and subcutaneous tissue, unspecified: Secondary | ICD-10-CM | POA: Diagnosis not present

## 2017-09-23 DIAGNOSIS — H3540 Unspecified peripheral retinal degeneration: Secondary | ICD-10-CM | POA: Diagnosis not present

## 2017-09-23 DIAGNOSIS — H524 Presbyopia: Secondary | ICD-10-CM | POA: Diagnosis not present

## 2017-09-23 DIAGNOSIS — H52222 Regular astigmatism, left eye: Secondary | ICD-10-CM | POA: Diagnosis not present

## 2017-09-23 DIAGNOSIS — H5203 Hypermetropia, bilateral: Secondary | ICD-10-CM | POA: Diagnosis not present

## 2017-09-23 LAB — HM DIABETES EYE EXAM

## 2017-09-25 DIAGNOSIS — Z1231 Encounter for screening mammogram for malignant neoplasm of breast: Secondary | ICD-10-CM | POA: Diagnosis not present

## 2017-09-25 LAB — HM MAMMOGRAPHY

## 2017-10-16 ENCOUNTER — Telehealth: Payer: Self-pay | Admitting: Family Medicine

## 2017-10-16 NOTE — Telephone Encounter (Signed)
Pt aware - these cant be mailed - fobt placed up front - she will have someone come by and get it for her

## 2017-10-20 DIAGNOSIS — E1142 Type 2 diabetes mellitus with diabetic polyneuropathy: Secondary | ICD-10-CM | POA: Diagnosis not present

## 2017-10-20 DIAGNOSIS — M79676 Pain in unspecified toe(s): Secondary | ICD-10-CM | POA: Diagnosis not present

## 2017-10-20 DIAGNOSIS — L84 Corns and callosities: Secondary | ICD-10-CM | POA: Diagnosis not present

## 2017-10-20 DIAGNOSIS — B351 Tinea unguium: Secondary | ICD-10-CM | POA: Diagnosis not present

## 2017-11-02 ENCOUNTER — Other Ambulatory Visit: Payer: Self-pay | Admitting: Family Medicine

## 2017-11-27 ENCOUNTER — Ambulatory Visit: Payer: Medicare Other | Admitting: Family Medicine

## 2017-12-04 ENCOUNTER — Other Ambulatory Visit: Payer: Self-pay | Admitting: Family Medicine

## 2017-12-12 ENCOUNTER — Other Ambulatory Visit: Payer: Self-pay | Admitting: Family Medicine

## 2017-12-15 DIAGNOSIS — B351 Tinea unguium: Secondary | ICD-10-CM | POA: Diagnosis not present

## 2017-12-15 DIAGNOSIS — E1142 Type 2 diabetes mellitus with diabetic polyneuropathy: Secondary | ICD-10-CM | POA: Diagnosis not present

## 2017-12-15 DIAGNOSIS — M79676 Pain in unspecified toe(s): Secondary | ICD-10-CM | POA: Diagnosis not present

## 2017-12-15 DIAGNOSIS — L84 Corns and callosities: Secondary | ICD-10-CM | POA: Diagnosis not present

## 2017-12-18 ENCOUNTER — Encounter: Payer: Self-pay | Admitting: Family Medicine

## 2017-12-18 ENCOUNTER — Ambulatory Visit (INDEPENDENT_AMBULATORY_CARE_PROVIDER_SITE_OTHER): Payer: Medicare Other | Admitting: Family Medicine

## 2017-12-18 VITALS — BP 178/92 | HR 66 | Temp 96.9°F | Ht 64.0 in | Wt 211.0 lb

## 2017-12-18 DIAGNOSIS — E119 Type 2 diabetes mellitus without complications: Secondary | ICD-10-CM

## 2017-12-18 DIAGNOSIS — Z1211 Encounter for screening for malignant neoplasm of colon: Secondary | ICD-10-CM | POA: Diagnosis not present

## 2017-12-18 DIAGNOSIS — I10 Essential (primary) hypertension: Secondary | ICD-10-CM

## 2017-12-18 DIAGNOSIS — E782 Mixed hyperlipidemia: Secondary | ICD-10-CM | POA: Diagnosis not present

## 2017-12-18 DIAGNOSIS — D509 Iron deficiency anemia, unspecified: Secondary | ICD-10-CM | POA: Diagnosis not present

## 2017-12-18 LAB — BAYER DCA HB A1C WAIVED: HB A1C: 5.9 % (ref <7.0)

## 2017-12-18 MED ORDER — ATORVASTATIN CALCIUM 20 MG PO TABS: 20.0000 mg | ORAL_TABLET | Freq: Every evening | ORAL | 0 refills | Status: DC

## 2017-12-18 MED ORDER — OLMESARTAN MEDOXOMIL 40 MG PO TABS: ORAL_TABLET | ORAL | 1 refills | Status: DC

## 2017-12-18 MED ORDER — METFORMIN HCL ER 500 MG PO TB24: ORAL_TABLET | ORAL | 1 refills | Status: DC

## 2017-12-18 MED ORDER — HYDRALAZINE HCL 100 MG PO TABS: ORAL_TABLET | ORAL | 1 refills | Status: DC

## 2017-12-18 MED ORDER — NEBIVOLOL HCL 20 MG PO TABS: ORAL_TABLET | ORAL | 1 refills | Status: DC

## 2017-12-18 MED ORDER — AMLODIPINE BESYLATE 10 MG PO TABS: ORAL_TABLET | ORAL | 1 refills | Status: DC

## 2017-12-18 NOTE — Progress Notes (Signed)
Subjective:  Patient ID: Beth [name],  female    DOB: 1946-07-10  Age: 72 y.o.    CC: Hypertension (pt here today for routine follow up of her chronic medical conditions, no other concerns voiced.)   HPI Beth [name] presents for  follow-up of hypertension. Patient has no history of headache chest pain or shortness of breath or recent cough. Patient also denies symptoms of TIA such as numbness weakness lateralizing. Patient denies side effects from medication. States taking it regularly.  Patient also  in for follow-up of elevated cholesterol. Doing well without complaints on current medication. Denies side effects of statin including myalgia and arthralgia and nausea. Also in today for liver function testing. Currently no chest pain, shortness of breath or other cardiovascular related symptoms noted.  Follow-up of diabetes. Patient does not check blood sugar at home regularly. No readings available today.  Patient denies symptoms such as polyuria, polydipsia, excessive hunger, nausea No significant hypoglycemic spells noted. Medications reviewed. Pt reports taking them regularly. Pt. denies complication/adverse reaction today.    History Beth [name] has a past medical history of Anemia, Arthritis, Blood transfusion without reported diagnosis (2012), Cataract, CKD (chronic kidney disease), stage III ([place]), CVA (cerebral infarction), Diabetes mellitus without complication ([place]), Family history of anesthesia complication, Gout, Heart murmur, Herpes infection (08-09-14), Hyperlipidemia, Hypertension, Nocturia, and Stroke ([place]) (2006).   She has a past surgical history that includes Carpal tunnel release (Right, 1983); colonscopy (June 21, 2014); nasal cauterization (2012); Total knee arthroplasty (Right, 07/03/2014); Patellar tendon repair (Right, 08/11/2014); Joint replacement (06/2014); I&D knee with poly exchange (Right, 10/02/2014); Excisional total knee arthroplasty with antibiotic spacers (Right,  02/16/2015); Reimplantation of total knee (Right, 05/23/2015); Abdominal hysterectomy (1983); Tubal ligation; and Incision and drainage of wound (Right, 01/14/2017).   Her family history includes Cancer in her mother; Diabetes in her son and son; Heart disease in her brother; Hypertension in her father; Kidney disease in her daughter; Ovarian cancer in her mother; Peripheral vascular disease in her father.She reports that  has never smoked. she has never used smokeless tobacco. She reports that she does not drink alcohol or use drugs.  Current Outpatient Medications on File Prior to Visit  Medication Sig Dispense Refill  . acetaminophen (TYLENOL) 500 MG tablet Take 1,000 mg by mouth every 6 (six) hours as needed for mild pain.    . carisoprodol (SOMA) 350 MG tablet Take 350 mg by mouth 3 (three) times daily.  0  . Multiple Vitamin (MULTIVITAMIN WITH MINERALS) TABS tablet Take 1 tablet by mouth every other day.    . ONE TOUCH ULTRA TEST test strip Use to check glucose up to four times daily 400 each 0  . ONETOUCH DELICA LANCETS 30S MISC USE TO CHECK GLUCOSE UP TO  4 TIMES DAILY 400 each 0   Current Facility-Administered Medications on File Prior to Visit  Medication Dose Route Frequency Provider Last Rate Last Dose  . tranexamic acid (CYKLOKAPRON) 2,000 mg in sodium chloride 0.9 % 50 mL Topical Application  9,233 mg Topical Once Aluisio, Pilar Plate, MD        ROS Review of Systems  Constitutional: Negative for activity change, appetite change and fever.  HENT: Negative for congestion, rhinorrhea and sore throat.   Eyes: Negative for visual disturbance.  Respiratory: Negative for cough and shortness of breath.   Cardiovascular: Negative for chest pain and palpitations.  Gastrointestinal: Negative for abdominal pain, diarrhea and nausea.  Genitourinary: Negative for dysuria.  Musculoskeletal: Negative for  arthralgias and myalgias.    Objective:  BP (!) 178/92   Pulse 66   Temp (!) 96.9 F (36.1  C) (Oral)   Ht _0  (1.626 m)   Wt 211 lb (95.7 kg)   BMI 36.22 kg/m   BP Readings from Last 3 Encounters:  12/18/17 (!) 178/92  09/02/17 (!) 164/93  08/28/17 139/76    Wt Readings from Last 3 Encounters:  12/18/17 211 lb (95.7 kg)  08/28/17 205 lb (93 kg)  05/08/17 201 lb (91.2 kg)     Physical Exam  Constitutional: She is oriented to person, place, and time. She appears well-developed and well-nourished. No distress.  HENT:  Head: Normocephalic and atraumatic.  Right Ear: External ear normal.  Left Ear: External ear normal.  Nose: Nose normal.  Mouth/Throat: Oropharynx is clear and moist.  Eyes: Conjunctivae and EOM are normal. Pupils are equal, round, and reactive to light.  Neck: Normal range of motion. Neck supple. No thyromegaly present.  Cardiovascular: Normal rate, regular rhythm and normal heart sounds.  No murmur heard. Pulmonary/Chest: Effort normal and breath sounds normal. No respiratory distress. She has no wheezes. She has no rales.  Abdominal: Soft. Bowel sounds are normal. She exhibits no distension. There is no tenderness.  Lymphadenopathy:    She has no cervical adenopathy.  Neurological: She is alert and oriented to person, place, and time. She has normal reflexes.  Skin: Skin is warm and dry.  Psychiatric: She has a normal mood and affect. Her behavior is normal. Judgment and thought content normal.    Diabetic Foot Exam - Simple   No data filed        Assessment & Plan:   Jase was seen today for hypertension.  Diagnoses and all orders for this visit:  Mixed hyperlipidemia -     CMP14+EGFR  Iron deficiency anemia, unspecified iron deficiency anemia type  Essential hypertension  Controlled type 2 diabetes mellitus without complication, without long-term current use of insulin (HCC) -     Bayer DCA Hb A1c Waived  Colon cancer screening -     Fecal occult blood, imunochemical; Future -     Fecal occult blood, imunochemical  Other  orders -     amLODipine (NORVASC) 10 MG tablet; TAKE ONE TABLET BY MOUTH ONCE DAILY IN THE EVENING -     atorvastatin (LIPITOR) 20 MG tablet; Take 1 tablet (20 mg total) by mouth every evening. -     hydrALAZINE (APRESOLINE) 100 MG tablet; TAKE 1 TABLET BY MOUTH THREE TIMES DAILY FOR BLOOD PRESSURE -     metFORMIN (GLUCOPHAGE-XR) 500 MG 24 hr tablet; TAKE 1 TABLET BY MOUTH ONCE DAILY WITH BREAKFAST -     Nebivolol HCl (BYSTOLIC) 20 MG TABS; TAKE ONE TABLET BY MOUTH IN THE EVENING FOR BLOOD PRESSURE -     olmesartan (BENICAR) 40 MG tablet; TAKE 1 TABLET BY MOUTH ONCE DAILY FOR BLOOD PRESSURE   I have discontinued Maryann Alar. Caldera's HYDROcodone-acetaminophen. I am also having her maintain her acetaminophen, multivitamin with minerals, carisoprodol, ONETOUCH DELICA LANCETS 75F, ONE TOUCH ULTRA TEST, amLODipine, atorvastatin, hydrALAZINE, metFORMIN, Nebivolol HCl, and olmesartan.  Meds ordered this encounter  Medications  . amLODipine (NORVASC) 10 MG tablet    Sig: TAKE ONE TABLET BY MOUTH ONCE DAILY IN THE EVENING    Dispense:  90 tablet    Refill:  1  . atorvastatin (LIPITOR) 20 MG tablet    Sig: Take 1 tablet (20 mg total) by mouth  every evening.    Dispense:  90 tablet    Refill:  0  . hydrALAZINE (APRESOLINE) 100 MG tablet    Sig: TAKE 1 TABLET BY MOUTH THREE TIMES DAILY FOR BLOOD PRESSURE    Dispense:  270 tablet    Refill:  1    Please consider 90 day supplies to promote better adherence  . metFORMIN (GLUCOPHAGE-XR) 500 MG 24 hr tablet    Sig: TAKE 1 TABLET BY MOUTH ONCE DAILY WITH BREAKFAST    Dispense:  90 tablet    Refill:  1  . Nebivolol HCl (BYSTOLIC) 20 MG TABS    Sig: TAKE ONE TABLET BY MOUTH IN THE EVENING FOR BLOOD PRESSURE    Dispense:  90 tablet    Refill:  1    Please consider 90 day supplies to promote better adherence  . olmesartan (BENICAR) 40 MG tablet    Sig: TAKE 1 TABLET BY MOUTH ONCE DAILY FOR BLOOD PRESSURE    Dispense:  90 tablet    Refill:  1    Check  glucose before eating in the morning and again two hours after supper. Write down the results on the glucose log sheet. Bring the log with you to the next appointment Follow-up: Return in about 3 months (around 03/18/2018).  Claretta Fraise, M.D.

## 2017-12-19 LAB — CMP14+EGFR
ALBUMIN: 4.5 g/dL (ref 3.5–4.8)
ALT: 10 IU/L (ref 0–32)
AST: 15 IU/L (ref 0–40)
Albumin/Globulin Ratio: 1.2 (ref 1.2–2.2)
Alkaline Phosphatase: 115 IU/L (ref 39–117)
BILIRUBIN TOTAL: 0.4 mg/dL (ref 0.0–1.2)
BUN / CREAT RATIO: 20 (ref 12–28)
BUN: 21 mg/dL (ref 8–27)
CHLORIDE: 100 mmol/L (ref 96–106)
CO2: 23 mmol/L (ref 20–29)
Calcium: 9.9 mg/dL (ref 8.7–10.3)
Creatinine, Ser: 1.06 mg/dL — ABNORMAL HIGH (ref 0.57–1.00)
GFR calc non Af Amer: 52 mL/min/{1.73_m2} — ABNORMAL LOW (ref >59)
GFR, EST AFRICAN AMERICAN: 60 mL/min/{1.73_m2} (ref >59)
GLOBULIN, TOTAL: 3.7 g/dL (ref 1.5–4.5)
Glucose: 78 mg/dL (ref 65–99)
POTASSIUM: 4.5 mmol/L (ref 3.5–5.2)
SODIUM: 138 mmol/L (ref 134–144)
TOTAL PROTEIN: 8.2 g/dL (ref 6.0–8.5)

## 2017-12-20 LAB — FECAL OCCULT BLOOD, IMMUNOCHEMICAL: Fecal Occult Bld: NEGATIVE

## 2017-12-24 ENCOUNTER — Encounter: Payer: Self-pay | Admitting: Family Medicine

## 2017-12-24 ENCOUNTER — Ambulatory Visit: Payer: Self-pay | Admitting: *Deleted

## 2017-12-30 ENCOUNTER — Encounter: Payer: Self-pay | Admitting: *Deleted

## 2017-12-30 ENCOUNTER — Ambulatory Visit (INDEPENDENT_AMBULATORY_CARE_PROVIDER_SITE_OTHER): Payer: Medicare Other | Admitting: *Deleted

## 2017-12-30 VITALS — BP 170/85 | HR 65 | Ht 61.0 in | Wt 215.0 lb

## 2017-12-30 DIAGNOSIS — Z0000 Encounter for general adult medical examination without abnormal findings: Secondary | ICD-10-CM

## 2017-12-30 NOTE — Patient Instructions (Addendum)
  Ms. Salmela , Thank you for taking time to come for your Medicare Wellness Visit. I appreciate your ongoing commitment to your health goals. Please review the following plan we discussed and let me know if I can assist you in the future.   These are the goals we discussed: Goals Continue to move carefully to avoid falls Continue with daily exercises  This is a list of the screening recommended for you and due dates:  Health Maintenance  Topic Date Due  .  Hepatitis C: One time screening is recommended by Center for Disease Control  (CDC) for  adults born from [birth year] through 1967.   05-20-1946  . Tetanus Vaccine  12/24/1962  . Pneumonia vaccines (1 of 2 - PCV13) 12/24/2008  . Eye exam for diabetics  11/04/2017  . Mammogram  12/23/2017  . Hemoglobin A1C  06/17/2018  . Complete foot exam   08/28/2018  . DEXA scan (bone density measurement)  10/09/2020  . Colon Cancer Screening  06/21/2024  . Flu Shot  Completed    Read over Advance Directives. If you decide to complete them, please bring a signed/notarized copy to our office.

## 2018-01-18 IMAGING — MR MR MRA HEAD W/O CM
9 of 12 series · 30 of 48 positions shown · non-contrast
Comparison: CT head 02/28/2016

CLINICAL DATA: Stroke. Left-sided weakness. Hypertension and
hyperlipidemia. Chronic kidney disease.

EXAM:
MRI HEAD WITHOUT CONTRAST
MRA HEAD WITHOUT CONTRAST
TECHNIQUE: Multiplanar, multiecho pulse sequences of the brain and surrounding
structures were obtained without intravenous contrast. Angiographic
images of the head were obtained using MRA technique without
contrast.

[Series 2: FLAIR · sagittal · 5.0mm · 0.47mm/px · 2 of 22 slices shown (1 of 2)]
[im 1/22]
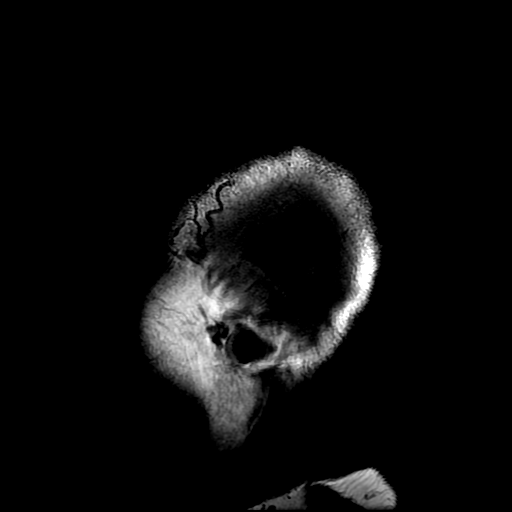
[im 22/22]
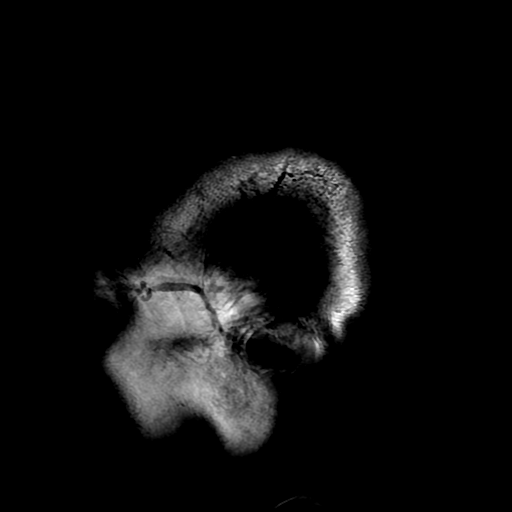

[Series 4: DWI · axial · 3.0mm · 0.94mm/px · z∈[-95,+47]mm · 7 of 100 slices shown (1 of 4)]
[im 1/100]
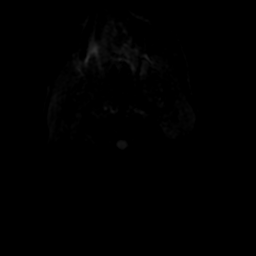
[im 17/100]
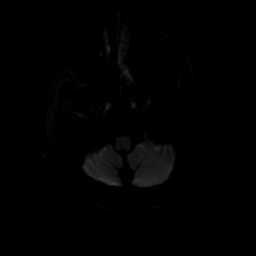
[im 34/100]
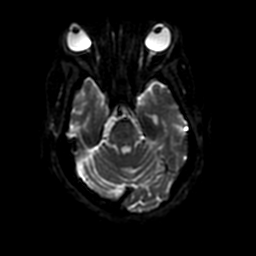
[im 50/100]
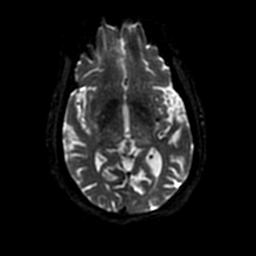
[im 67/100]
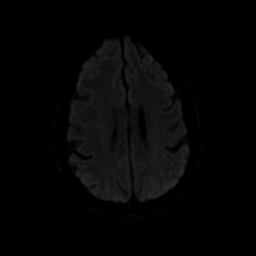
[im 83/100]
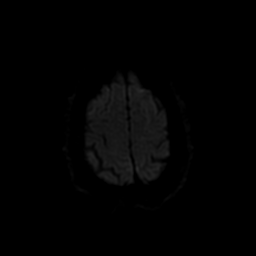
[im 100/100]
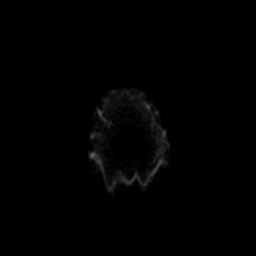

[Series 5: ax (id) 2 · axial · 1.0mm · 0.43mm/px · z∈[-97,-42]mm · 5 of 200 slices shown]
[im 1/200]
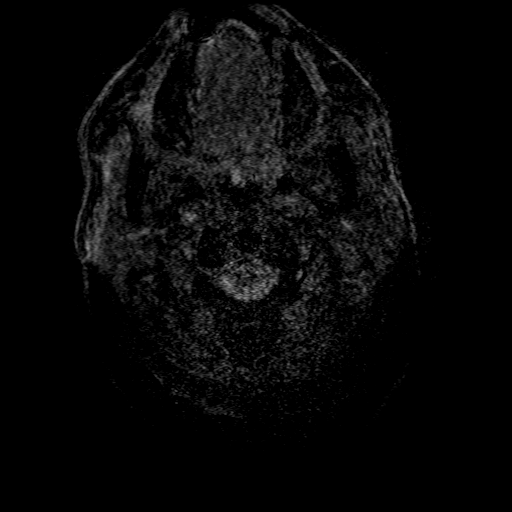
[im 34/200]
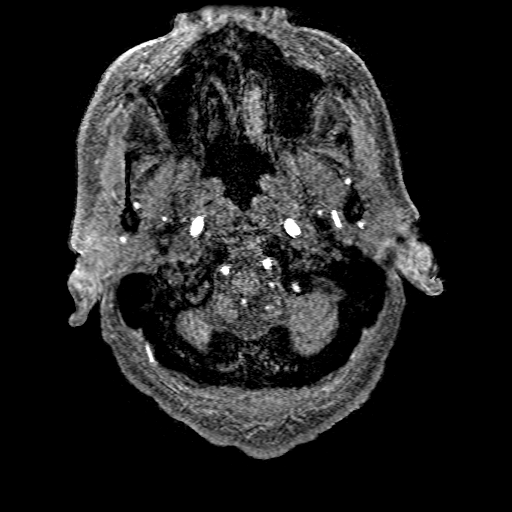
[im 67/200]
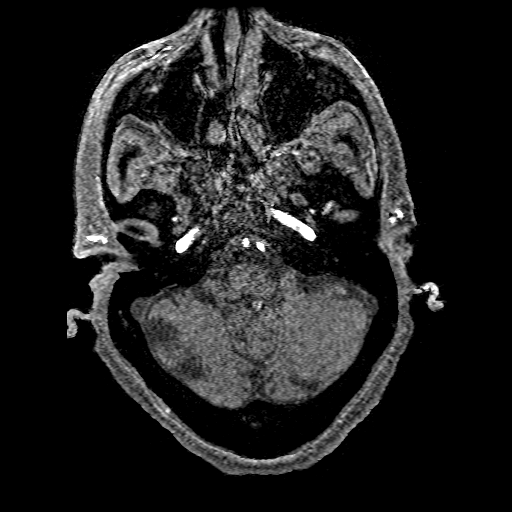
[im 83/200]
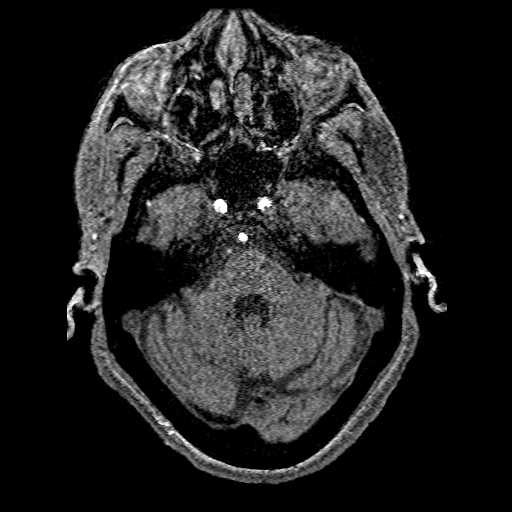
[im 117/200]
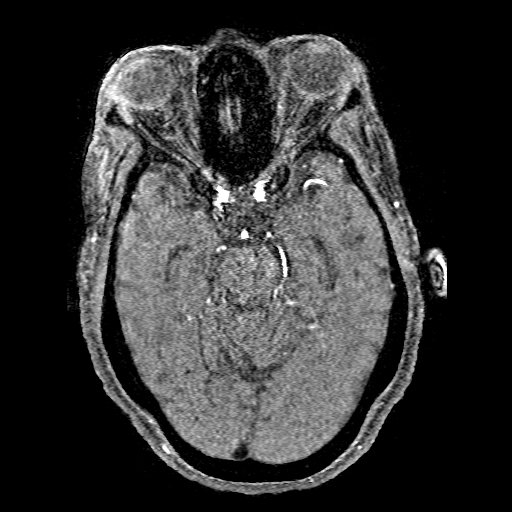

[Series 6: T2 · axial · 5.0mm · 0.47mm/px · z∈[-90,+42]mm · 2 of 24 slices shown (1 of 2)]
[im 1/24]
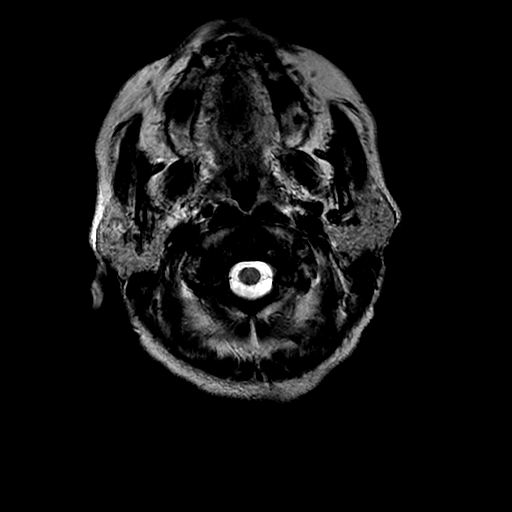
[im 24/24]
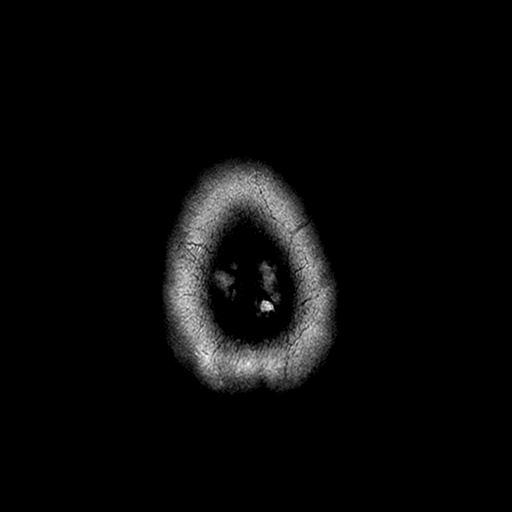

[Series 7: DWI · coronal · 4.0mm · 0.94mm/px · 5 of 72 slices shown (2 of 4)]
[im 1/72]
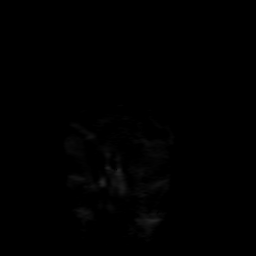
[im 18/72]
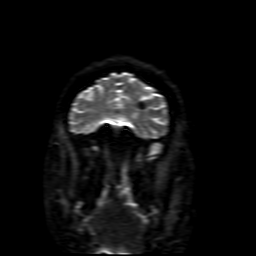
[im 36/72]
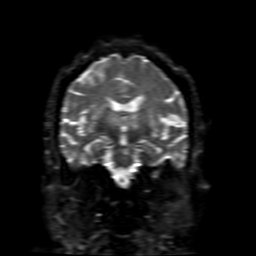
[im 54/72]
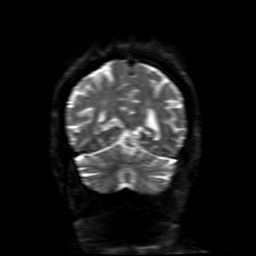
[im 72/72]
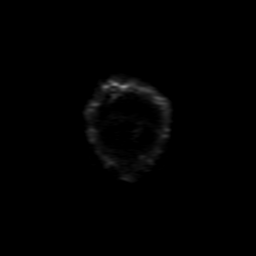

[Series 8: FLAIR · axial · 5.0mm · 0.47mm/px · z∈[-90,+42]mm · 2 of 24 slices shown (2 of 2)]
[im 1/24]
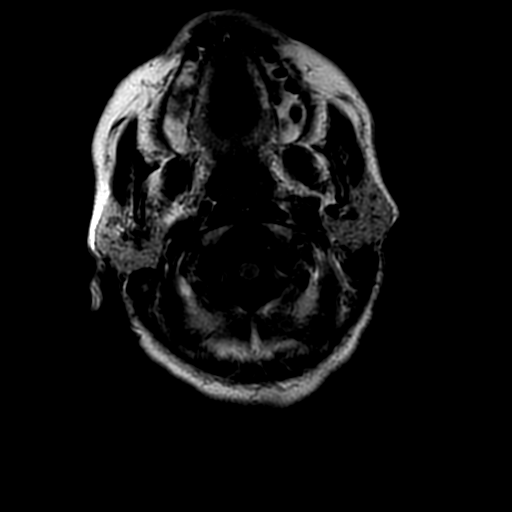
[im 24/24]
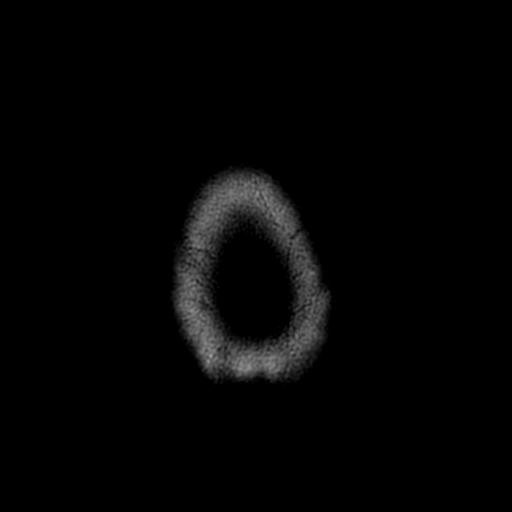

[Series 11: T2 · coronal · 5.0mm · 0.39mm/px · 2 of 30 slices shown (2 of 2)]
[im 1/30]
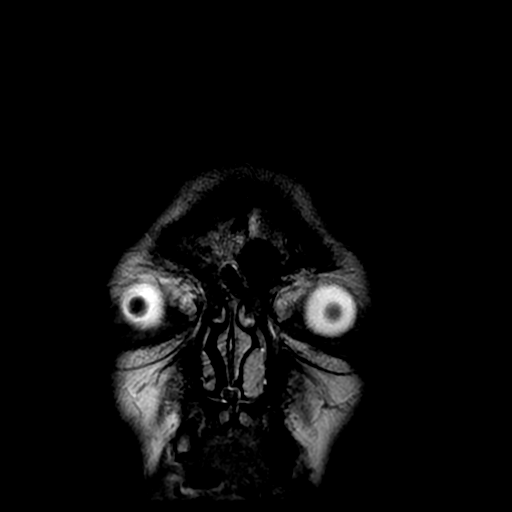
[im 30/30]
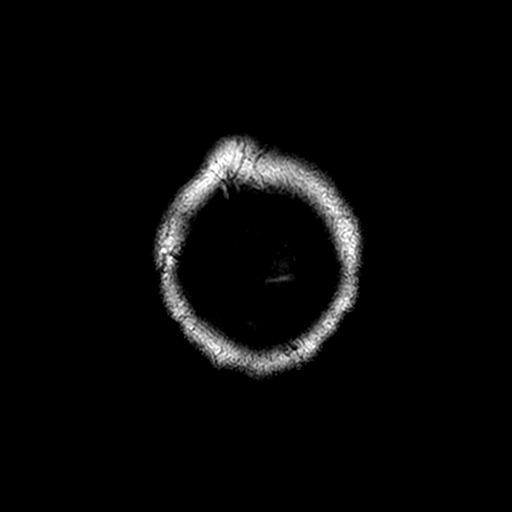

[Series 400: DWI · axial · 3.0mm · 0.94mm/px · z∈[-95,+47]mm · 3 of 50 slices shown (3 of 4)]
[im 1/50]
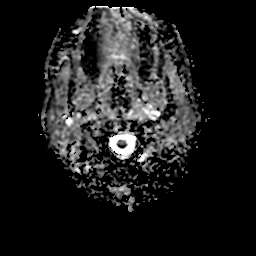
[im 25/50]
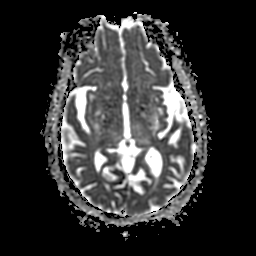
[im 50/50]
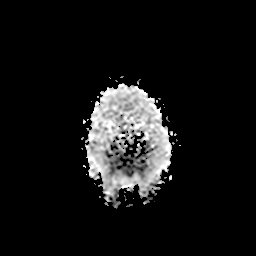

[Series 700: DWI · coronal · 4.0mm · 0.94mm/px · 2 of 36 slices shown (4 of 4)]
[im 1/36]
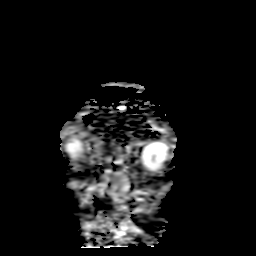
[im 36/36]
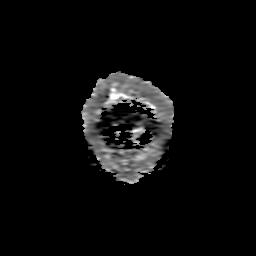

[30 of 48 positions shown; findings below may reference images not displayed]

FINDINGS: MRI HEAD FINDINGS

Negative for acute infarct.

Moderate atrophy.

Advanced chronic ischemic changes. Chronic microvascular ischemic
change throughout the white matter and basal ganglia. Chronic
infarcts in the thalamus bilaterally. Chronic ischemia in the pons
left greater than right.

Numerous areas of chronic micro hemorrhage in the brain. These are
present in the cerebral hemispheres, basal ganglia, and cerebellum
bilaterally. Findings suggest poorly controlled hypertension versus
cerebral amyloid.

Negative for mass lesion.  No shift of the midline structures.

Pituitary normal in size. Normal skullbase. Mild mucosal edema in
the paranasal sinuses. No orbital mass.

MRA HEAD FINDINGS

Both vertebral arteries patent to the basilar. Left PICA patent.
Right PICA not visualized. Basilar widely patent. Superior
cerebellar and posterior cerebral arteries patent without
significant stenosis. Fetal origin right posterior cerebral artery.

Internal carotid artery widely patent without significant stenosis.
Anterior and middle cerebral arteries patent bilaterally without
significant stenosis

Negative for cerebral aneurysm.
IMPRESSION: Negative for acute infarct

Advanced chronic microvascular ischemia. Advance multifocal chronic
microhemorrhage most consistent with poorly controlled hypertension

Negative MRA head

## 2018-01-20 ENCOUNTER — Encounter: Payer: Self-pay | Admitting: *Deleted

## 2018-01-20 NOTE — Progress Notes (Signed)
Subjective:   Beth [name] is a 72 y.o. female who presents for an Initial Medicare Annual Wellness Visit. Beth [name] is widowed and lives with her granddaughter. She has 7 adult children and many grandchildren and great grandchildren. She is retired.    Review of Systems    Health is about the same as last year.   Cardiac: Risk factors-advanced age, diabetes, hypertension, hyperlipidemia, sedentary lifestyle  Other systems negative.      Objective:    BP (!) 170/85   Pulse 65   Ht 5\' 1"  (1.549 m)   Wt 215 lb (97.5 kg)   BMI 40.62 kg/m   Body mass index is 40.62 kg/m.  Advanced Directives 12/30/2017 02/11/2017 01/14/2017 01/08/2017 12/16/2016 02/28/2016 02/12/2016  Does Patient Have a Medical Advance Directive? No No No No No Yes Yes  Type of Advance Directive - - - - - Press photographer;Living will [place];Living will  Does patient want to make changes to medical advance directive? - - - - - No - Patient declined -  Copy of [place] in [place]  Would patient like information on creating a medical advance directive? Yes (MAU/Ambulatory/Procedural Areas - Information given) No - Patient declined No - Patient declined No - Patient declined Yes (MAU/Ambulatory/Procedural Areas - Information given) - -  Pre-existing out of facility DNR order (yellow form or pink MOST form) - - - - - - -    Current Medications (verified) Outpatient Encounter Medications as of 12/30/2017  Medication Sig  . acetaminophen (TYLENOL) 500 MG tablet Take 1,000 mg by mouth every 6 (six) hours as needed for mild pain.  Marland Kitchen amLODipine (NORVASC) 10 MG tablet TAKE ONE TABLET BY MOUTH ONCE DAILY IN THE EVENING  . atorvastatin (LIPITOR) 20 MG tablet Take 1 tablet (20 mg total) by mouth every evening.  . carisoprodol (SOMA) 350 MG tablet Take 350 mg by mouth 3 (three) times daily.  . hydrALAZINE (APRESOLINE) 100 MG tablet TAKE 1 TABLET BY MOUTH THREE TIMES DAILY  FOR BLOOD PRESSURE  . metFORMIN (GLUCOPHAGE-XR) 500 MG 24 hr tablet TAKE 1 TABLET BY MOUTH ONCE DAILY WITH BREAKFAST  . Multiple Vitamin (MULTIVITAMIN WITH MINERALS) TABS tablet Take 1 tablet by mouth every other day.  . Nebivolol HCl (BYSTOLIC) 20 MG TABS TAKE ONE TABLET BY MOUTH IN THE EVENING FOR BLOOD PRESSURE  . olmesartan (BENICAR) 40 MG tablet TAKE 1 TABLET BY MOUTH ONCE DAILY FOR BLOOD PRESSURE  . ONE TOUCH ULTRA TEST test strip Use to check glucose up to four times daily  . ONETOUCH DELICA LANCETS 42A MISC USE TO CHECK GLUCOSE UP TO  4 TIMES DAILY   Facility-Administered Encounter Medications as of 12/30/2017  Medication  . tranexamic acid (CYKLOKAPRON) 2,000 mg in sodium chloride 0.9 % 50 mL Topical Application    Allergies (verified) Aleve [naproxen sodium]; Asa [aspirin]; Clonidine derivatives; and Shellfish allergy   History: Past Medical History:  Diagnosis Date  . Anemia   . Arthritis    Knee both knees  . Blood transfusion without reported diagnosis 2012   anemia;pt denies transfusion stated was only on iron tablet  . Cataract    left  . CKD (chronic kidney disease), stage III ([place])   . CVA (cerebral infarction)    2006  . Diabetes mellitus without complication ([place])   . Family history of anesthesia complication    sister very slow to awaken after  anesthesia;severe vomiting   . Gout    left elbow  . Heart murmur   . 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 08-09-14 Right eye  . Hyperlipidemia   . Hypertension   . Nocturia    3-4 times per night  . Stroke [facility]) 2006   x 1 no deficits noted    Past Surgical History:  Procedure Laterality Date  . ABDOMINAL HYSTERECTOMY  1983  . CARPAL TUNNEL RELEASE Right 1983  . colonscopy  June 21, 2014  . EXCISIONAL TOTAL KNEE ARTHROPLASTY WITH ANTIBIOTIC SPACERS Right 02/16/2015   Procedure: RIGHT KNEE RESECTION ARTHROPLASTY WITH ANTIBIOTIC SPACERS;  Surgeon: [name], MD;  Location: [facility];  Service:  Orthopedics;  Laterality: Right;  . I&D KNEE WITH POLY EXCHANGE Right 10/02/2014   Procedure: IRRIGATION AND DEBRIDEMENT RIGHT KNEE WITH POLY EXCHANGE;  Surgeon: Gearlean Alf, MD;  Location: [facility];  Service: Orthopedics;  Laterality: Right;  . INCISION AND DRAINAGE OF WOUND Right 01/14/2017   Procedure: IRRIGATION AND DEBRIDEMENT WOUND;  Surgeon: [name], MD;  Location: [facility];  Service: Orthopedics;  Laterality: Right;  requests 37mins  . JOINT REPLACEMENT  06/2014   right knee  . nasal cauterization  2012  . PATELLAR TENDON REPAIR Right 08/11/2014   Procedure: RIGHT PATELLA TENDON REPAIR;  Surgeon: Gearlean Alf, MD;  Location: [facility];  Service: Orthopedics;  Laterality: Right;  . REIMPLANTATION OF TOTAL KNEE Right 05/23/2015   Procedure: RIGHT KNEE ARTHROPLASTY REIMPLANTATION;  Surgeon: [name], MD;  Location: [facility];  Service: Orthopedics;  Laterality: Right;  . TOTAL KNEE ARTHROPLASTY Right 07/03/2014   Procedure: RIGHT TOTAL KNEE ARTHROPLASTY;  Surgeon: Gearlean Alf, MD;  Location: [facility];  Service: Orthopedics;  Laterality: Right;  . TUBAL LIGATION     Family History  Problem Relation Age of Onset  . Ovarian cancer Mother   . Cancer Mother   . Diabetes Son   . Diabetes Son   . Peripheral vascular disease Father        with amputation of both legs  . Hypertension Father   . Heart disease Brother   . Kidney disease Daughter   . Colon cancer Neg Hx   . Esophageal cancer Neg Hx   . Stomach cancer Neg Hx   . Rectal cancer Neg Hx    Social History   Socioeconomic History  . Marital status: Widowed    Spouse name: Not on file  . Number of children: 7  . Years of education: 8th  . Highest education level: 8th grade  Social Needs  . Financial resource strain: Not hard at all  . Food insecurity - worry: Never true  . Food insecurity - inability: Never true  . Transportation needs - medical: No  . Transportation needs - non-medical: No  Occupational History     Employer: RETIRED  Tobacco Use  . Smoking status: Never Smoker  . Smokeless tobacco: Never Used  Substance and Sexual Activity  . Alcohol use: No  . Drug use: No  . Sexual activity: No  Other Topics Concern  . Not on file  Social History Narrative   Patient is widowed and lives with her granddaughter. She has 7 adult children and many grandchildren and great grandchildren. Patient is retired. 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     Tobacco Counseling NO tobacco use  Clinical Intake:     Pain : 0-10 Pain Score: 4  Pain Type:  Chronic pain Pain Location: Knee Pain Orientation: Right Pain Descriptors / Indicators: Aching Pain Onset: More than a month ago Pain Frequency: Several days a week Pain Relieving Factors: Tylenol Effect of Pain on Daily Activities: mild  Pain Relieving Factors: Tylenol  Nutritional Status: BMI 25 -29 Overweight Diabetes: No  How often do you need to have someone help you when you read instructions, pamphlets, or other written materials from your doctor or pharmacy?: 1 - Never What is the last grade level you completed in school?: 8th grade     Information entered by :: Chong Sicilian, RN   Activities of Daily Living In your present state of health, do you have any difficulty performing the following activities: 12/30/2017  Hearing? N  Vision? N  Difficulty concentrating or making decisions? N  Walking or climbing stairs? N  Dressing or bathing? N  Doing errands, shopping? N  Preparing Food and eating ? N  Using the Toilet? N  In the past six months, have you accidently leaked urine? N  Do you have problems with loss of bowel control? N  Managing your Medications? N  Managing your Finances? N  Housekeeping or managing your Housekeeping? N  Some recent data might be hidden     Immunizations and Health Maintenance Immunization History  Administered Date(s) Administered  . Influenza, High Dose Seasonal PF  08/27/2016  . Influenza,inj,Quad PF,6+ Mos 11/19/2015   Health Maintenance Due  Topic Date Due  . Hepatitis C Screening  13-Jun-1946  . TETANUS/TDAP  12/24/1962  . PNA vac Low Risk Adult (1 of 2 - PCV13) 12/24/2008    Patient Care Team: Claretta Fraise, MD as PCP - General (Family Medicine) Claretta Fraise, MD (Family Medicine)  Reports No hospitalizations, ER visits, or surgeries this past year.       Assessment:   This is a routine wellness examination for [place].  Hearing/Vision screen No deficits noted during visit.   Dietary issues and exercise activities discussed: Current Exercise Habits: The patient does not participate in regular exercise at present, Exercise limited by: None identified  Goals No falls 150 min of mod activity a week  Depression Screen PHQ 2/9 Scores 12/30/2017 12/18/2017 08/28/2017 05/08/2017 04/22/2017 04/03/2017 02/26/2017  PHQ - 2 Score 0 0 0 0 0 0 0    Fall Risk Fall Risk  12/30/2017 12/18/2017 08/28/2017 05/08/2017 04/22/2017  Falls in the past year? No No No No No  Comment - - - - -  Number falls in past yr: - - - - -  Comment - - - - -  Injury with Fall? - - - - -  Risk Factor Category  - - - - -  Risk for fall due to : - - - - -  Follow up - - - - -   Cognitive Function: MMSE - Mini Mental State Exam 12/30/2017 12/16/2016 08/10/2015  Orientation to time 5 5 5   Orientation to Place 5 5 5   Registration 3 3 3   Attention/ Calculation 5 5 5   Recall 3 3 3   Language- name 2 objects 2 2 2   Language- repeat 1 1 1   Language- follow 3 step command 3 3 3   Language- read & follow direction 1 1 1   Write a sentence 1 1 1   Copy design 1 0 1  Total score 30 29 30         Screening Tests Health Maintenance  Topic Date Due  . Hepatitis C Screening  1946-05-28  . TETANUS/TDAP  12/24/1962  . PNA vac Low Risk Adult (1 of 2 - PCV13) 12/24/2008  . HEMOGLOBIN A1C  06/17/2018  . FOOT EXAM  08/28/2018  . OPHTHALMOLOGY EXAM  09/23/2018  . MAMMOGRAM  09/26/2019    . DEXA SCAN  10/09/2020  . COLONOSCOPY  06/21/2024  . INFLUENZA VACCINE  Completed      Plan:    Keep f/u with PCP Continue to exercise daily Move carefully to avoid falls Consider pneumonia vaccines and tdap  I have personally reviewed and noted the following in the patient's chart:   . Medical and social history . Use of alcohol, tobacco or illicit drugs  . Current medications and supplements . Functional ability and status . Nutritional status . Physical activity . Advanced directives . List of other physicians . Hospitalizations, surgeries, and ER visits in previous 12 months . Vitals . Screenings to include cognitive, depression, and falls . Referrals and appointments  In addition, I have reviewed and discussed with patient certain preventive protocols, quality metrics, and best practice recommendations. A written personalized care plan for preventive services as well as general preventive health recommendations were provided to patient.     Chong Sicilian, RN   12/30/2017 I have reviewed and agree with the above AWV documentation.  Claretta Fraise, M.D.

## 2018-02-02 NOTE — Addendum Note (Signed)
Addended by: Ilean China on: 02/02/2018 09:33 AM   Modules accepted: Level of Service

## 2018-02-08 ENCOUNTER — Telehealth: Payer: Self-pay | Admitting: *Deleted

## 2018-02-08 MED ORDER — OLMESARTAN MEDOXOMIL 40 MG PO TABS: ORAL_TABLET | ORAL | 0 refills | Status: DC

## 2018-02-08 NOTE — Telephone Encounter (Signed)
LMOVM that refill sent to [facility]

## 2018-02-11 ENCOUNTER — Telehealth: Payer: Self-pay | Admitting: *Deleted

## 2018-02-11 MED ORDER — OLMESARTAN MEDOXOMIL 40 MG PO TABS: ORAL_TABLET | ORAL | 0 refills | Status: DC

## 2018-02-11 NOTE — Telephone Encounter (Signed)
Pt called back would like refill to [facility] Refill sent in

## 2018-02-11 NOTE — Telephone Encounter (Signed)
Pt's refill was received by [facility] it is on backorder Pt will see if another pharmacy has it in stock & will call back

## 2018-03-19 ENCOUNTER — Ambulatory Visit: Payer: Medicare Other | Admitting: Family Medicine

## 2018-03-30 DIAGNOSIS — E1142 Type 2 diabetes mellitus with diabetic polyneuropathy: Secondary | ICD-10-CM | POA: Diagnosis not present

## 2018-03-30 DIAGNOSIS — M79676 Pain in unspecified toe(s): Secondary | ICD-10-CM | POA: Diagnosis not present

## 2018-03-30 DIAGNOSIS — B351 Tinea unguium: Secondary | ICD-10-CM | POA: Diagnosis not present

## 2018-03-30 DIAGNOSIS — L84 Corns and callosities: Secondary | ICD-10-CM | POA: Diagnosis not present

## 2018-04-02 DIAGNOSIS — M48061 Spinal stenosis, lumbar region without neurogenic claudication: Secondary | ICD-10-CM | POA: Diagnosis not present

## 2018-04-02 DIAGNOSIS — M47816 Spondylosis without myelopathy or radiculopathy, lumbar region: Secondary | ICD-10-CM | POA: Diagnosis not present

## 2018-04-13 DIAGNOSIS — M47816 Spondylosis without myelopathy or radiculopathy, lumbar region: Secondary | ICD-10-CM | POA: Diagnosis not present

## 2018-04-15 LAB — HM DIABETES EYE EXAM

## 2018-04-16 ENCOUNTER — Other Ambulatory Visit: Payer: Self-pay | Admitting: Family Medicine

## 2018-04-29 DIAGNOSIS — M25561 Pain in right knee: Secondary | ICD-10-CM | POA: Diagnosis not present

## 2018-04-29 DIAGNOSIS — M25562 Pain in left knee: Secondary | ICD-10-CM | POA: Diagnosis not present

## 2018-05-13 ENCOUNTER — Other Ambulatory Visit: Payer: Self-pay | Admitting: Family Medicine

## 2018-05-25 DIAGNOSIS — M25561 Pain in right knee: Secondary | ICD-10-CM | POA: Diagnosis not present

## 2018-05-31 ENCOUNTER — Telehealth: Payer: Self-pay | Admitting: Family Medicine

## 2018-05-31 NOTE — Telephone Encounter (Signed)
Patient has lack of transportation and is only able to obtain a ride to doctor's appointment on 06/07/18.  She has not been seen for chronic follow up since January and is running out of medications.  Appointment made for 06/07/18 at 2:25 pm

## 2018-06-07 ENCOUNTER — Encounter: Payer: Self-pay | Admitting: Family Medicine

## 2018-06-07 ENCOUNTER — Ambulatory Visit (INDEPENDENT_AMBULATORY_CARE_PROVIDER_SITE_OTHER): Payer: Medicare Other | Admitting: Family Medicine

## 2018-06-07 VITALS — BP 122/66 | HR 63 | Temp 97.1°F | Ht 61.0 in | Wt 203.1 lb

## 2018-06-07 DIAGNOSIS — E119 Type 2 diabetes mellitus without complications: Secondary | ICD-10-CM

## 2018-06-07 DIAGNOSIS — E782 Mixed hyperlipidemia: Secondary | ICD-10-CM | POA: Diagnosis not present

## 2018-06-07 DIAGNOSIS — R609 Edema, unspecified: Secondary | ICD-10-CM

## 2018-06-07 DIAGNOSIS — I10 Essential (primary) hypertension: Secondary | ICD-10-CM | POA: Diagnosis not present

## 2018-06-07 LAB — BAYER DCA HB A1C WAIVED: HB A1C (BAYER DCA - WAIVED): 6.2 % (ref <7.0)

## 2018-06-07 MED ORDER — NEBIVOLOL HCL 20 MG PO TABS: ORAL_TABLET | ORAL | 0 refills | Status: DC

## 2018-06-07 MED ORDER — FUROSEMIDE 40 MG PO TABS: 40.0000 mg | ORAL_TABLET | ORAL | 2 refills | Status: DC

## 2018-06-07 MED ORDER — OLMESARTAN MEDOXOMIL 40 MG PO TABS: ORAL_TABLET | ORAL | 0 refills | Status: DC

## 2018-06-07 MED ORDER — AMLODIPINE BESYLATE 10 MG PO TABS: ORAL_TABLET | ORAL | 1 refills | Status: DC

## 2018-06-07 MED ORDER — HYDRALAZINE HCL 100 MG PO TABS: ORAL_TABLET | ORAL | 1 refills | Status: DC

## 2018-06-07 MED ORDER — ATORVASTATIN CALCIUM 20 MG PO TABS: 20.0000 mg | ORAL_TABLET | Freq: Every evening | ORAL | 1 refills | Status: DC

## 2018-06-07 NOTE — Patient Instructions (Signed)

## 2018-06-07 NOTE — Progress Notes (Signed)
Subjective:  Patient ID: Beth [name],  female    DOB: June 26, 1946  Age: 72 y.o.    CC: Medical Management of Chronic Issues   HPI KAELEY VINJE presents for  follow-up of hypertension. Patient has no history of headache chest pain or shortness of breath or recent cough. Patient also denies symptoms of TIA such as numbness weakness lateralizing. Patient denies side effects from medication. States taking it regularly.  Patient also  in for follow-up of elevated cholesterol. Doing well without complaints on current medication. Denies side effects  including myalgia and arthralgia and nausea. Also in today for liver function testing. Currently no chest pain, shortness of breath or other cardiovascular related symptoms noted.  Follow-up of diabetes. Patient does check blood sugar at home. Readings run between 90 and 140 fasting not checking postprandial. Patient denies symptoms such as excessive hunger or urinary frequency, excessive hunger, nausea No significant hypoglycemic spells noted. Medications reviewed. Pt reports taking them regularly. Pt. denies complication/adverse reaction today.   Patient relates that she is having significant swelling in the ankles and lower legs.  This is bilateral.  She relates that orthopedics has offered her amputation of the right lower extremity due to infection in the knee joint.  She has declined that to this date.  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  She continues to be under care of Ortho. 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   She has a past  surgical history that includes Carpal tunnel release (Right, 1983); colonscopy (June 21, 2014); nasal cauterization (2012); Total knee arthroplasty (Right, 07/03/2014); Patellar tendon repair (Right, 08/11/2014); Joint replacement (06/2014); I&D knee with poly exchange (Right, 10/02/2014); Excisional total knee arthroplasty with antibiotic spacers (Right, 02/16/2015); Reimplantation of total knee (Right, 05/23/2015); Abdominal hysterectomy (1983); Tubal ligation; and Incision and drainage of wound (Right, 01/14/2017).   Her family history includes Cancer in her mother; Diabetes in her son and son; Heart disease in her brother; Hypertension in her father; Kidney disease in her daughter; Ovarian cancer in her mother; Peripheral vascular disease in her father.She reports that she has never smoked. She has never used smokeless tobacco. She reports that she does not drink alcohol or use drugs.  Current Outpatient Medications on File Prior to Visit  Medication Sig Dispense Refill  . acetaminophen (TYLENOL) 500 MG tablet Take 1,000 mg by mouth every 6 (six) hours as needed for mild pain.    . metFORMIN (GLUCOPHAGE-XR) 500 MG 24 hr tablet TAKE ONE (1) TABLET EACH DAY 90 tablet 1  . Multiple Vitamin (MULTIVITAMIN WITH MINERALS) TABS tablet Take 1 tablet by mouth every other day.    . ONE TOUCH ULTRA TEST test strip Use to check glucose up to four times daily 400 each 0  . ONETOUCH DELICA LANCETS 45X MISC USE TO CHECK GLUCOSE UP TO  4 TIMES DAILY 400 each 0   Current Facility-Administered Medications on File Prior to Visit  Medication Dose Route Frequency Provider Last Rate Last Dose  . tranexamic acid (CYKLOKAPRON) 2,000 mg in sodium chloride 0.9 % 50 mL Topical Application  6,468 mg Topical Once [name], MD        ROS Review of  Systems  Constitutional: Negative.   HENT: Negative for congestion.   Eyes: Negative for visual disturbance.  Respiratory: Negative for shortness of breath.   Cardiovascular:  Negative for chest pain.  Gastrointestinal: Negative for abdominal pain, constipation, diarrhea, nausea and vomiting.  Genitourinary: Negative for difficulty urinating.  Musculoskeletal: Positive for arthralgias and myalgias (Right leg cramps at night.).  Neurological: Negative for headaches.  Psychiatric/Behavioral: Negative for sleep disturbance.    Objective:  BP 122/66   Pulse 63   Temp (!) 97.1 F (36.2 C) (Oral)   Ht 5' 1"  (1.549 m)   Wt 203 lb 2 oz (92.1 kg)   BMI 38.38 kg/m   BP Readings from Last 3 Encounters:  06/07/18 122/66  12/30/17 (!) 170/85  12/18/17 (!) 178/92    Wt Readings from Last 3 Encounters:  06/07/18 203 lb 2 oz (92.1 kg)  12/30/17 215 lb (97.5 kg)  12/18/17 211 lb (95.7 kg)     Physical Exam  Constitutional: She is oriented to person, place, and time. She appears well-developed and well-nourished. No distress.  HENT:  Head: Normocephalic and atraumatic.  Right Ear: External ear normal.  Left Ear: External ear normal.  Nose: Nose normal.  Mouth/Throat: Oropharynx is clear and moist.  Eyes: Pupils are equal, round, and reactive to light. Conjunctivae and EOM are normal.  Neck: Normal range of motion. Neck supple. No thyromegaly present.  Cardiovascular: Normal rate, regular rhythm and normal heart sounds.  No murmur heard. Pulmonary/Chest: Effort normal and breath sounds normal. No respiratory distress. She has no wheezes. She has no rales.  Abdominal: Soft. Bowel sounds are normal. She exhibits no distension. There is no tenderness.  Musculoskeletal: She exhibits edema (2+ BLE to lower calves).  Lymphadenopathy:    She has no cervical adenopathy.  Neurological: She is alert and oriented to person, place, and time. She has normal reflexes.  Skin: Skin is warm and dry.  Psychiatric: She has a normal mood and affect. Her behavior is normal. Judgment and thought content normal.    Diabetic Foot Exam - Simple   Simple Foot Form Diabetic Foot  exam was performed with the following findings:  Yes 06/07/2018  3:02 PM  Visual Inspection See comments:  Yes Sensation Testing Intact to touch and monofilament testing bilaterally:  Yes Pulse Check Posterior Tibialis and Dorsalis pulse intact bilaterally:  Yes Comments There is shiny parchment look to the shins bilaterally with 2+ edema.  The feet themselves have some dryness and callus       Assessment & Plan:   Michiko was seen today for medical management of chronic issues.  Diagnoses and all orders for this visit:  Controlled type 2 diabetes mellitus without complication, without long-term current use of insulin (HCC) -     CBC with Differential/Platelet -     CMP14+EGFR -     Bayer DCA Hb A1c Waived  Essential hypertension  Mixed hyperlipidemia  Dependent edema  Other orders -     furosemide (LASIX) 40 MG tablet; Take 1 tablet (40 mg total) by mouth every morning. -     amLODipine (NORVASC) 10 MG tablet; TAKE ONE TABLET BY MOUTH ONCE DAILY IN THE EVENING -     atorvastatin (LIPITOR) 20 MG tablet; Take 1 tablet (20 mg total) by mouth every evening. -     hydrALAZINE (APRESOLINE) 100 MG tablet; TAKE 1 TABLET BY MOUTH THREE TIMES DAILY FOR BLOOD PRESSURE -     Nebivolol HCl (BYSTOLIC) 20 MG TABS;  TAKE ONE (1) TABLET EACH DAY -     olmesartan (BENICAR) 40 MG tablet; TAKE ONE (1) TABLET EACH DAY   I have discontinued Maryann Alar. Krogstad's carisoprodol. I am also having her start on furosemide. Additionally, I am having her maintain her acetaminophen, multivitamin with minerals, ONETOUCH DELICA LANCETS 27G, ONE TOUCH ULTRA TEST, metFORMIN, amLODipine, atorvastatin, hydrALAZINE, Nebivolol HCl, and olmesartan.  Meds ordered this encounter  Medications  . furosemide (LASIX) 40 MG tablet    Sig: Take 1 tablet (40 mg total) by mouth every morning.    Dispense:  30 tablet    Refill:  2  . amLODipine (NORVASC) 10 MG tablet    Sig: TAKE ONE TABLET BY MOUTH ONCE DAILY IN THE EVENING     Dispense:  90 tablet    Refill:  1  . atorvastatin (LIPITOR) 20 MG tablet    Sig: Take 1 tablet (20 mg total) by mouth every evening.    Dispense:  90 tablet    Refill:  1  . hydrALAZINE (APRESOLINE) 100 MG tablet    Sig: TAKE 1 TABLET BY MOUTH THREE TIMES DAILY FOR BLOOD PRESSURE    Dispense:  270 tablet    Refill:  1    Please consider 90 day supplies to promote better adherence  . Nebivolol HCl (BYSTOLIC) 20 MG TABS    Sig: TAKE ONE (1) TABLET EACH DAY    Dispense:  90 each    Refill:  0  . olmesartan (BENICAR) 40 MG tablet    Sig: TAKE ONE (1) TABLET EACH DAY    Dispense:  90 tablet    Refill:  0     Follow-up: Return in about 3 months (around 09/07/2018), or if symptoms worsen or fail to improve.  Claretta Fraise, M.D.

## 2018-06-08 LAB — CBC WITH DIFFERENTIAL/PLATELET
Basophils Absolute: 0 10*3/uL (ref 0.0–0.2)
Basos: 0 %
EOS (ABSOLUTE): 0.1 10*3/uL (ref 0.0–0.4)
EOS: 1 %
HEMATOCRIT: 37.7 % (ref 34.0–46.6)
Hemoglobin: 11.5 g/dL (ref 11.1–15.9)
Immature Grans (Abs): 0 10*3/uL (ref 0.0–0.1)
Immature Granulocytes: 0 %
LYMPHS ABS: 1.7 10*3/uL (ref 0.7–3.1)
Lymphs: 24 %
MCH: 24.7 pg — AB (ref 26.6–33.0)
MCHC: 30.5 g/dL — AB (ref 31.5–35.7)
MCV: 81 fL (ref 79–97)
MONOS ABS: 0.6 10*3/uL (ref 0.1–0.9)
Monocytes: 8 %
Neutrophils Absolute: 4.7 10*3/uL (ref 1.4–7.0)
Neutrophils: 67 %
Platelets: 391 10*3/uL (ref 150–450)
RBC: 4.65 x10E6/uL (ref 3.77–5.28)
RDW: 15.9 % — AB (ref 12.3–15.4)
WBC: 7.1 10*3/uL (ref 3.4–10.8)

## 2018-06-08 LAB — CMP14+EGFR
A/G RATIO: 1.2 (ref 1.2–2.2)
ALK PHOS: 124 IU/L — AB (ref 39–117)
ALT: 10 IU/L (ref 0–32)
AST: 10 IU/L (ref 0–40)
Albumin: 4.1 g/dL (ref 3.5–4.8)
BILIRUBIN TOTAL: 0.7 mg/dL (ref 0.0–1.2)
BUN / CREAT RATIO: 32 — AB (ref 12–28)
BUN: 32 mg/dL — AB (ref 8–27)
CHLORIDE: 101 mmol/L (ref 96–106)
CO2: 24 mmol/L (ref 20–29)
Calcium: 9.7 mg/dL (ref 8.7–10.3)
Creatinine, Ser: 1.01 mg/dL — ABNORMAL HIGH (ref 0.57–1.00)
GFR calc Af Amer: 63 mL/min/{1.73_m2} (ref >59)
GFR calc non Af Amer: 55 mL/min/{1.73_m2} — ABNORMAL LOW (ref >59)
GLOBULIN, TOTAL: 3.5 g/dL (ref 1.5–4.5)
Glucose: 97 mg/dL (ref 65–99)
Potassium: 4.4 mmol/L (ref 3.5–5.2)
Sodium: 139 mmol/L (ref 134–144)
Total Protein: 7.6 g/dL (ref 6.0–8.5)

## 2018-06-22 ENCOUNTER — Other Ambulatory Visit: Payer: Self-pay | Admitting: Pharmacist

## 2018-06-22 DIAGNOSIS — B351 Tinea unguium: Secondary | ICD-10-CM | POA: Diagnosis not present

## 2018-06-22 DIAGNOSIS — M79676 Pain in unspecified toe(s): Secondary | ICD-10-CM | POA: Diagnosis not present

## 2018-06-22 DIAGNOSIS — E1142 Type 2 diabetes mellitus with diabetic polyneuropathy: Secondary | ICD-10-CM | POA: Diagnosis not present

## 2018-06-22 DIAGNOSIS — L84 Corns and callosities: Secondary | ICD-10-CM | POA: Diagnosis not present

## 2018-06-22 NOTE — Patient Outreach (Signed)
[facility]) Care Management  06/22/2018  Beth [name] Aug 18, 1946 [id number]   Outreach call to Beth [name] regarding her request for follow up from the [facility] Medication Adherence Campaign. Left a HIPAA compliant message on the patient's voicemail.  Harlow Asa, PharmD, [facility] [phone]

## 2018-06-23 ENCOUNTER — Telehealth: Payer: Self-pay | Admitting: Family Medicine

## 2018-06-23 ENCOUNTER — Other Ambulatory Visit: Payer: Self-pay | Admitting: Family Medicine

## 2018-06-23 MED ORDER — ROPINIROLE HCL 1 MG PO TABS: 1.0000 mg | ORAL_TABLET | Freq: Every day | ORAL | 5 refills | Status: DC

## 2018-06-23 NOTE — Telephone Encounter (Signed)
I sent in the requested prescription 

## 2018-06-24 NOTE — Telephone Encounter (Signed)
Pt aware.

## 2018-06-29 ENCOUNTER — Other Ambulatory Visit: Payer: Self-pay | Admitting: Family Medicine

## 2018-06-30 ENCOUNTER — Telehealth: Payer: Self-pay | Admitting: Family Medicine

## 2018-06-30 NOTE — Telephone Encounter (Signed)
Aware of provider's advice. 

## 2018-06-30 NOTE — Telephone Encounter (Signed)
Olmesartan usually prevents potassium loss. If cramps continue, follow up in the office.

## 2018-06-30 NOTE — Telephone Encounter (Signed)
Pt daughter Mardene Celeste is calling wanting to speak to Dr Livia Snellen nurse, states that he prescribed her a fluid pill and not a potassium pill and wants to speak to the nurse about it

## 2018-06-30 NOTE — Telephone Encounter (Signed)
Patient's daughter is concerned because her mother was seen by you on 06/07/18 and was prescribed Furosemide for her edema and was not also prescribed a potassium tablet.  She reports that her mother has a problem with her potassium dropping while on Furosemide and that normally she receives both.  They called [place] on 06/23/18 stating that her mom was having leg cramps, at that time she was prescribed Requip.  Please review and advise.

## 2018-07-20 DIAGNOSIS — Z029 Encounter for administrative examinations, unspecified: Secondary | ICD-10-CM

## 2018-08-02 ENCOUNTER — Telehealth: Payer: Self-pay | Admitting: Family Medicine

## 2018-08-03 NOTE — Telephone Encounter (Signed)
Form placed on providers desk 

## 2018-08-05 NOTE — Telephone Encounter (Signed)
Pt aware form ready to be picked up

## 2018-08-06 ENCOUNTER — Inpatient Hospital Stay (HOSPITAL_COMMUNITY)
Admission: AD | Admit: 2018-08-06 | Discharge: 2018-08-17 | DRG: 560 | Disposition: A | Discharge status: Skilled Nursing Facility | Payer: Medicare Other | Source: Ambulatory Visit | Attending: Orthopedic Surgery | Admitting: Orthopedic Surgery

## 2018-08-06 ENCOUNTER — Inpatient Hospital Stay (HOSPITAL_COMMUNITY): Payer: Medicare Other

## 2018-08-06 DIAGNOSIS — M1A00X0 Idiopathic chronic gout, unspecified site, without tophus (tophi): Secondary | ICD-10-CM | POA: Diagnosis not present

## 2018-08-06 DIAGNOSIS — I10 Essential (primary) hypertension: Secondary | ICD-10-CM | POA: Diagnosis present

## 2018-08-06 DIAGNOSIS — M86651 Other chronic osteomyelitis, right thigh: Secondary | ICD-10-CM | POA: Diagnosis not present

## 2018-08-06 DIAGNOSIS — T8453XA Infection and inflammatory reaction due to internal right knee prosthesis, initial encounter: Secondary | ICD-10-CM | POA: Diagnosis not present

## 2018-08-06 DIAGNOSIS — Z8673 Personal history of transient ischemic attack (TIA), and cerebral infarction without residual deficits: Secondary | ICD-10-CM | POA: Diagnosis not present

## 2018-08-06 DIAGNOSIS — E1122 Type 2 diabetes mellitus with diabetic chronic kidney disease: Secondary | ICD-10-CM | POA: Diagnosis present

## 2018-08-06 DIAGNOSIS — M25561 Pain in right knee: Secondary | ICD-10-CM

## 2018-08-06 DIAGNOSIS — M1711 Unilateral primary osteoarthritis, right knee: Secondary | ICD-10-CM | POA: Diagnosis not present

## 2018-08-06 DIAGNOSIS — Z79899 Other long term (current) drug therapy: Secondary | ICD-10-CM | POA: Diagnosis not present

## 2018-08-06 DIAGNOSIS — M19021 Primary osteoarthritis, right elbow: Secondary | ICD-10-CM | POA: Diagnosis not present

## 2018-08-06 DIAGNOSIS — D649 Anemia, unspecified: Secondary | ICD-10-CM | POA: Diagnosis not present

## 2018-08-06 DIAGNOSIS — N183 Chronic kidney disease, stage 3 (moderate): Secondary | ICD-10-CM | POA: Diagnosis present

## 2018-08-06 DIAGNOSIS — K5900 Constipation, unspecified: Secondary | ICD-10-CM | POA: Diagnosis not present

## 2018-08-06 DIAGNOSIS — R54 Age-related physical debility: Secondary | ICD-10-CM | POA: Diagnosis not present

## 2018-08-06 DIAGNOSIS — L02415 Cutaneous abscess of right lower limb: Secondary | ICD-10-CM | POA: Diagnosis not present

## 2018-08-06 DIAGNOSIS — Z79891 Long term (current) use of opiate analgesic: Secondary | ICD-10-CM | POA: Diagnosis not present

## 2018-08-06 DIAGNOSIS — T8453XD Infection and inflammatory reaction due to internal right knee prosthesis, subsequent encounter: Secondary | ICD-10-CM | POA: Diagnosis not present

## 2018-08-06 DIAGNOSIS — I129 Hypertensive chronic kidney disease with stage 1 through stage 4 chronic kidney disease, or unspecified chronic kidney disease: Secondary | ICD-10-CM | POA: Diagnosis present

## 2018-08-06 DIAGNOSIS — R739 Hyperglycemia, unspecified: Secondary | ICD-10-CM

## 2018-08-06 DIAGNOSIS — T380X5A Adverse effect of glucocorticoids and synthetic analogues, initial encounter: Secondary | ICD-10-CM

## 2018-08-06 DIAGNOSIS — M25531 Pain in right wrist: Secondary | ICD-10-CM

## 2018-08-06 DIAGNOSIS — T84032D Mechanical loosening of internal right knee prosthetic joint, subsequent encounter: Secondary | ICD-10-CM | POA: Diagnosis not present

## 2018-08-06 DIAGNOSIS — M170 Bilateral primary osteoarthritis of knee: Secondary | ICD-10-CM | POA: Diagnosis not present

## 2018-08-06 DIAGNOSIS — E118 Type 2 diabetes mellitus with unspecified complications: Secondary | ICD-10-CM | POA: Diagnosis not present

## 2018-08-06 DIAGNOSIS — H259 Unspecified age-related cataract: Secondary | ICD-10-CM | POA: Diagnosis not present

## 2018-08-06 DIAGNOSIS — M79609 Pain in unspecified limb: Secondary | ICD-10-CM | POA: Diagnosis not present

## 2018-08-06 DIAGNOSIS — Z91013 Allergy to seafood: Secondary | ICD-10-CM | POA: Diagnosis not present

## 2018-08-06 DIAGNOSIS — M6281 Muscle weakness (generalized): Secondary | ICD-10-CM | POA: Diagnosis not present

## 2018-08-06 DIAGNOSIS — M25511 Pain in right shoulder: Secondary | ICD-10-CM

## 2018-08-06 DIAGNOSIS — E785 Hyperlipidemia, unspecified: Secondary | ICD-10-CM | POA: Diagnosis not present

## 2018-08-06 DIAGNOSIS — M25521 Pain in right elbow: Secondary | ICD-10-CM

## 2018-08-06 DIAGNOSIS — M10022 Idiopathic gout, left elbow: Secondary | ICD-10-CM | POA: Diagnosis not present

## 2018-08-06 DIAGNOSIS — L03115 Cellulitis of right lower limb: Secondary | ICD-10-CM | POA: Diagnosis not present

## 2018-08-06 DIAGNOSIS — T8453XS Infection and inflammatory reaction due to internal right knee prosthesis, sequela: Secondary | ICD-10-CM

## 2018-08-06 DIAGNOSIS — E1165 Type 2 diabetes mellitus with hyperglycemia: Secondary | ICD-10-CM | POA: Diagnosis not present

## 2018-08-06 DIAGNOSIS — Z888 Allergy status to other drugs, medicaments and biological substances status: Secondary | ICD-10-CM

## 2018-08-06 DIAGNOSIS — L02419 Cutaneous abscess of limb, unspecified: Secondary | ICD-10-CM | POA: Diagnosis not present

## 2018-08-06 DIAGNOSIS — Y831 Surgical operation with implant of artificial internal device as the cause of abnormal reaction of the patient, or of later complication, without mention of misadventure at the time of the procedure: Secondary | ICD-10-CM | POA: Diagnosis present

## 2018-08-06 DIAGNOSIS — M19011 Primary osteoarthritis, right shoulder: Secondary | ICD-10-CM | POA: Diagnosis not present

## 2018-08-06 DIAGNOSIS — M25431 Effusion, right wrist: Secondary | ICD-10-CM

## 2018-08-06 DIAGNOSIS — E119 Type 2 diabetes mellitus without complications: Secondary | ICD-10-CM

## 2018-08-06 DIAGNOSIS — M7989 Other specified soft tissue disorders: Secondary | ICD-10-CM | POA: Diagnosis not present

## 2018-08-06 DIAGNOSIS — M19031 Primary osteoarthritis, right wrist: Secondary | ICD-10-CM | POA: Diagnosis not present

## 2018-08-06 DIAGNOSIS — M25461 Effusion, right knee: Secondary | ICD-10-CM | POA: Diagnosis not present

## 2018-08-06 DIAGNOSIS — I639 Cerebral infarction, unspecified: Secondary | ICD-10-CM | POA: Diagnosis not present

## 2018-08-06 DIAGNOSIS — Z886 Allergy status to analgesic agent status: Secondary | ICD-10-CM | POA: Diagnosis not present

## 2018-08-06 DIAGNOSIS — M109 Gout, unspecified: Secondary | ICD-10-CM | POA: Diagnosis not present

## 2018-08-06 DIAGNOSIS — R011 Cardiac murmur, unspecified: Secondary | ICD-10-CM | POA: Diagnosis not present

## 2018-08-06 DIAGNOSIS — E559 Vitamin D deficiency, unspecified: Secondary | ICD-10-CM | POA: Diagnosis not present

## 2018-08-06 HISTORY — DX: Infection and inflammatory reaction due to internal right knee prosthesis, initial encounter: T84.53XA

## 2018-08-06 LAB — COMPREHENSIVE METABOLIC PANEL
ALT: 9 U/L (ref 0–44)
AST: 19 U/L (ref 15–41)
Albumin: 3.5 g/dL (ref 3.5–5.0)
Alkaline Phosphatase: 88 U/L (ref 38–126)
Anion gap: 12 (ref 5–15)
BUN: 23 mg/dL (ref 8–23)
CO2: 24 mmol/L (ref 22–32)
Calcium: 9.2 mg/dL (ref 8.9–10.3)
Chloride: 103 mmol/L (ref 98–111)
Creatinine, Ser: 1.13 mg/dL — ABNORMAL HIGH (ref 0.44–1.00)
GFR calc Af Amer: 54 mL/min — ABNORMAL LOW (ref >60)
GFR calc non Af Amer: 47 mL/min — ABNORMAL LOW (ref >60)
Glucose, Bld: 181 mg/dL — ABNORMAL HIGH (ref 70–99)
Potassium: 4.3 mmol/L (ref 3.5–5.1)
Sodium: 139 mmol/L (ref 135–145)
Total Bilirubin: 0.6 mg/dL (ref 0.3–1.2)
Total Protein: 7.1 g/dL (ref 6.5–8.1)

## 2018-08-06 LAB — CBC WITH DIFFERENTIAL/PLATELET
Basophils Absolute: 0 10*3/uL (ref 0.0–0.1)
Basophils Relative: 0 %
Eosinophils Absolute: 0.2 10*3/uL (ref 0.0–0.7)
Eosinophils Relative: 2 %
HCT: 34.6 % — ABNORMAL LOW (ref 36.0–46.0)
Hemoglobin: 10.8 g/dL — ABNORMAL LOW (ref 12.0–15.0)
Lymphocytes Relative: 33 %
Lymphs Abs: 2.1 10*3/uL (ref 0.7–4.0)
MCH: 25.3 pg — ABNORMAL LOW (ref 26.0–34.0)
MCHC: 31.2 g/dL (ref 30.0–36.0)
MCV: 81 fL (ref 78.0–100.0)
Monocytes Absolute: 0.6 10*3/uL (ref 0.1–1.0)
Monocytes Relative: 8 %
Neutro Abs: 3.7 10*3/uL (ref 1.7–7.7)
Neutrophils Relative %: 57 %
Platelets: 334 10*3/uL (ref 150–400)
RBC: 4.27 MIL/uL (ref 3.87–5.11)
RDW: 15.2 % (ref 11.5–15.5)
WBC: 6.5 10*3/uL (ref 4.0–10.5)

## 2018-08-06 LAB — CREATININE, SERUM
CREATININE: 0.97 mg/dL (ref 0.44–1.00)
GFR calc Af Amer: >60 mL/min (ref >60)
GFR, EST NON AFRICAN AMERICAN: 56 mL/min — AB (ref >60)

## 2018-08-06 LAB — APTT: aPTT: 31 seconds (ref 24–36)

## 2018-08-06 LAB — GLUCOSE, CAPILLARY: Glucose-Capillary: 205 mg/dL — ABNORMAL HIGH (ref 70–99)

## 2018-08-06 LAB — PROTIME-INR
INR: 1.06
Prothrombin Time: 13.7 seconds (ref 11.4–15.2)

## 2018-08-06 MED ORDER — ACETAMINOPHEN 500 MG PO TABS: 1000.0000 mg | ORAL_TABLET | Freq: Four times a day (QID) | ORAL | Status: DC | PRN

## 2018-08-06 MED ORDER — DOCUSATE SODIUM 100 MG PO CAPS
100.0000 mg | ORAL_CAPSULE | Freq: Two times a day (BID) | ORAL | Status: DC
  Administered 2018-08-06 – 2018-08-17 (×22): 100 mg via ORAL
  Filled 2018-08-06 (×22): qty 1

## 2018-08-06 MED ORDER — POLYETHYLENE GLYCOL 3350 17 G PO PACK
17.0000 g | PACK | Freq: Every day | ORAL | Status: DC | PRN
  Administered 2018-08-07 – 2018-08-12 (×3): 17 g via ORAL
  Filled 2018-08-06 (×3): qty 1

## 2018-08-06 MED ORDER — IRBESARTAN 300 MG PO TABS
300.0000 mg | ORAL_TABLET | Freq: Every day | ORAL | Status: DC
  Administered 2018-08-07 – 2018-08-17 (×11): 300 mg via ORAL
  Filled 2018-08-06 (×2): qty 2
  Filled 2018-08-06 (×2): qty 1
  Filled 2018-08-06 (×8): qty 2

## 2018-08-06 MED ORDER — METFORMIN HCL ER 500 MG PO TB24
500.0000 mg | ORAL_TABLET | Freq: Every day | ORAL | Status: DC
  Administered 2018-08-07 – 2018-08-08 (×2): 500 mg via ORAL
  Filled 2018-08-06 (×2): qty 1

## 2018-08-06 MED ORDER — SODIUM CHLORIDE 0.9% FLUSH: 3.0000 mL | INTRAVENOUS | Status: DC | PRN

## 2018-08-06 MED ORDER — INSULIN ASPART 100 UNIT/ML ~~LOC~~ SOLN
0.0000 [IU] | Freq: Every day | SUBCUTANEOUS | Status: DC
  Administered 2018-08-06 – 2018-08-15 (×2): 2 [IU] via SUBCUTANEOUS

## 2018-08-06 MED ORDER — SODIUM CHLORIDE 0.9 % IV SOLN: 250.0000 mL | INTRAVENOUS | Status: DC | PRN

## 2018-08-06 MED ORDER — TRAMADOL HCL 50 MG PO TABS
50.0000 mg | ORAL_TABLET | Freq: Four times a day (QID) | ORAL | Status: DC | PRN
  Administered 2018-08-09 – 2018-08-10 (×2): 50 mg via ORAL
  Administered 2018-08-11: 100 mg via ORAL
  Administered 2018-08-13 – 2018-08-14 (×2): 50 mg via ORAL
  Administered 2018-08-15: 100 mg via ORAL
  Filled 2018-08-06: qty 2
  Filled 2018-08-06 (×4): qty 1
  Filled 2018-08-06: qty 2

## 2018-08-06 MED ORDER — ROPINIROLE HCL 1 MG PO TABS
1.0000 mg | ORAL_TABLET | Freq: Every day | ORAL | Status: DC
  Administered 2018-08-06 – 2018-08-16 (×11): 1 mg via ORAL
  Filled 2018-08-06 (×11): qty 1

## 2018-08-06 MED ORDER — BISACODYL 10 MG RE SUPP
10.0000 mg | Freq: Every day | RECTAL | Status: DC | PRN
  Filled 2018-08-06: qty 1

## 2018-08-06 MED ORDER — FLEET ENEMA 7-19 GM/118ML RE ENEM: 1.0000 | ENEMA | Freq: Once | RECTAL | Status: DC | PRN

## 2018-08-06 MED ORDER — NEBIVOLOL HCL 10 MG PO TABS
20.0000 mg | ORAL_TABLET | Freq: Every day | ORAL | Status: DC
  Administered 2018-08-06 – 2018-08-14 (×9): 20 mg via ORAL
  Filled 2018-08-06 (×11): qty 2

## 2018-08-06 MED ORDER — VANCOMYCIN HCL IN DEXTROSE 750-5 MG/150ML-% IV SOLN
750.0000 mg | INTRAVENOUS | Status: DC
  Administered 2018-08-07: 750 mg via INTRAVENOUS
  Filled 2018-08-06 (×2): qty 150

## 2018-08-06 MED ORDER — OXYCODONE HCL 5 MG PO TABS
5.0000 mg | ORAL_TABLET | Freq: Four times a day (QID) | ORAL | Status: DC | PRN
  Administered 2018-08-06: 5 mg via ORAL
  Administered 2018-08-07 – 2018-08-09 (×5): 10 mg via ORAL
  Administered 2018-08-09: 5 mg via ORAL
  Administered 2018-08-09: 10 mg via ORAL
  Administered 2018-08-10 (×2): 5 mg via ORAL
  Administered 2018-08-11 – 2018-08-12 (×3): 10 mg via ORAL
  Administered 2018-08-12 – 2018-08-13 (×2): 5 mg via ORAL
  Administered 2018-08-13: 10 mg via ORAL
  Administered 2018-08-14 – 2018-08-15 (×3): 5 mg via ORAL
  Administered 2018-08-15: 10 mg via ORAL
  Administered 2018-08-16: 5 mg via ORAL
  Filled 2018-08-06: qty 1
  Filled 2018-08-06 (×2): qty 2
  Filled 2018-08-06 (×2): qty 1
  Filled 2018-08-06 (×3): qty 2
  Filled 2018-08-06 (×4): qty 1
  Filled 2018-08-06 (×4): qty 2
  Filled 2018-08-06: qty 1
  Filled 2018-08-06: qty 2
  Filled 2018-08-06 (×3): qty 1
  Filled 2018-08-06: qty 2
  Filled 2018-08-06 (×2): qty 1
  Filled 2018-08-06 (×2): qty 2

## 2018-08-06 MED ORDER — SODIUM CHLORIDE 0.9 % IV SOLN
INTRAVENOUS | Status: DC
  Administered 2018-08-06 – 2018-08-11 (×5): via INTRAVENOUS

## 2018-08-06 MED ORDER — VANCOMYCIN HCL 10 G IV SOLR
1500.0000 mg | Freq: Once | INTRAVENOUS | Status: AC
  Administered 2018-08-06: 1500 mg via INTRAVENOUS
  Filled 2018-08-06 (×2): qty 1500

## 2018-08-06 MED ORDER — INSULIN ASPART 100 UNIT/ML ~~LOC~~ SOLN
0.0000 [IU] | Freq: Three times a day (TID) | SUBCUTANEOUS | Status: DC
  Administered 2018-08-08: 3 [IU] via SUBCUTANEOUS
  Administered 2018-08-09: 2 [IU] via SUBCUTANEOUS
  Administered 2018-08-09: 3 [IU] via SUBCUTANEOUS
  Administered 2018-08-10 (×2): 8 [IU] via SUBCUTANEOUS
  Administered 2018-08-10: 3 [IU] via SUBCUTANEOUS
  Administered 2018-08-11: 5 [IU] via SUBCUTANEOUS
  Administered 2018-08-11 – 2018-08-12 (×3): 3 [IU] via SUBCUTANEOUS
  Administered 2018-08-12: 2 [IU] via SUBCUTANEOUS
  Administered 2018-08-13: 3 [IU] via SUBCUTANEOUS
  Administered 2018-08-13: 1 [IU] via SUBCUTANEOUS
  Administered 2018-08-14: 2 [IU] via SUBCUTANEOUS
  Administered 2018-08-14: 3 [IU] via SUBCUTANEOUS
  Administered 2018-08-14: 2 [IU] via SUBCUTANEOUS
  Administered 2018-08-15: 3 [IU] via SUBCUTANEOUS
  Administered 2018-08-15: 15 [IU] via SUBCUTANEOUS
  Administered 2018-08-15: 8 [IU] via SUBCUTANEOUS
  Administered 2018-08-16: 5 [IU] via SUBCUTANEOUS
  Administered 2018-08-16: 2 [IU] via SUBCUTANEOUS
  Administered 2018-08-16: 5 [IU] via SUBCUTANEOUS
  Administered 2018-08-17 (×2): 3 [IU] via SUBCUTANEOUS

## 2018-08-06 MED ORDER — VITAMIN B-12 100 MCG PO TABS
100.0000 ug | ORAL_TABLET | Freq: Every day | ORAL | Status: DC
  Administered 2018-08-07 – 2018-08-17 (×10): 100 ug via ORAL
  Filled 2018-08-06 (×11): qty 1

## 2018-08-06 MED ORDER — ADULT MULTIVITAMIN W/MINERALS CH
1.0000 | ORAL_TABLET | ORAL | Status: DC
  Administered 2018-08-07 – 2018-08-11 (×3): 1 via ORAL
  Filled 2018-08-06 (×3): qty 1

## 2018-08-06 MED ORDER — ACETAMINOPHEN 325 MG PO TABS
650.0000 mg | ORAL_TABLET | Freq: Four times a day (QID) | ORAL | Status: DC | PRN
  Administered 2018-08-12: 650 mg via ORAL
  Filled 2018-08-06: qty 2

## 2018-08-06 MED ORDER — SODIUM CHLORIDE 0.9% FLUSH
3.0000 mL | Freq: Two times a day (BID) | INTRAVENOUS | Status: DC
  Administered 2018-08-07 – 2018-08-17 (×13): 3 mL via INTRAVENOUS

## 2018-08-06 MED ORDER — FUROSEMIDE 40 MG PO TABS
40.0000 mg | ORAL_TABLET | Freq: Every morning | ORAL | Status: DC
  Administered 2018-08-07 – 2018-08-15 (×9): 40 mg via ORAL
  Filled 2018-08-06 (×9): qty 1

## 2018-08-06 MED ORDER — ATORVASTATIN CALCIUM 20 MG PO TABS
20.0000 mg | ORAL_TABLET | Freq: Every evening | ORAL | Status: DC
  Administered 2018-08-06 – 2018-08-16 (×11): 20 mg via ORAL
  Filled 2018-08-06 (×11): qty 1

## 2018-08-06 MED ORDER — AMLODIPINE BESYLATE 10 MG PO TABS
10.0000 mg | ORAL_TABLET | Freq: Every day | ORAL | Status: DC
  Administered 2018-08-06 – 2018-08-17 (×12): 10 mg via ORAL
  Filled 2018-08-06 (×12): qty 1

## 2018-08-06 MED ORDER — ACETAMINOPHEN 650 MG RE SUPP: 650.0000 mg | Freq: Four times a day (QID) | RECTAL | Status: DC | PRN

## 2018-08-06 MED ORDER — ONDANSETRON HCL 4 MG/2ML IJ SOLN
4.0000 mg | Freq: Four times a day (QID) | INTRAMUSCULAR | Status: DC | PRN
  Administered 2018-08-07 – 2018-08-09 (×3): 4 mg via INTRAVENOUS
  Filled 2018-08-06 (×3): qty 2

## 2018-08-06 MED ORDER — ONDANSETRON HCL 4 MG PO TABS: 4.0000 mg | ORAL_TABLET | Freq: Four times a day (QID) | ORAL | Status: DC | PRN

## 2018-08-06 NOTE — Progress Notes (Signed)
)  Pharmacy Antibiotic Note  Beth [name] is a 72 y.o. female admitted on 08/06/2018 with the chief complaint of right knee drainage. She has had a longstanding history of right total knee infections.   Pharmacy has been consulted for vancomycin dosing for wound infection.  Plan: -Vancomycin 1500mg  IV x1 then 750mg  q24h (AUC 481.6, Scr 0.97) -Follow renal function, cultures and clinical course      Temp (24hrs), Avg:98 F (36.7 C), Min:98 F (36.7 C), Max:98 F (36.7 C)  Recent Labs  Lab 08/06/18 1837  CREATININE 0.97    CrCl cannot be calculated (Unknown ideal weight.).    Allergies  Allergen Reactions  . Aleve [Naproxen Sodium]     Heart races  . Asa [Aspirin]     Nose bleeding  . Clonidine Derivatives Other (See Comments)    Dizziness and weakness  . Shellfish Allergy Nausea And Vomiting    Antimicrobials this admission: 8/30 vanc >>  Thank you for allowing pharmacy to be a part of this patient's care.  Dolly Rias RPh 08/06/2018, 8:02 PM Pager [phone]

## 2018-08-06 NOTE — H&P (Signed)
Beth [name] is an 72 y.o. female.   Chief Complaint: right knee infection HPI: Beth [name] is a 72 year old female who presents with the chief complaint of right knee drainage. She has had a longstanding history of right total knee infections. She had a right TKA in 2015. She developed an infection and had a I&D with polyexchange in late 2015. She had another I&D with polyexchange in 2016. She did not respond well and required excision with placement of antibiotic spacers. She improved and cultures were negative. She had a reimplantation in June 2016. She did well for a couple of years until she developed a superficial abscess in early 2018. She had an I&D of the abscess at that time over the proximal tibia. She reports drainage from that area on a daily basis since that surgery. She reports that towards the end of last week she developed a "bump" on her knee with associated erythema. She says that yesterday the area "popped" and she has had pus draining from the area since. She denies fevers and chills. Temp in the office 98.5. She reports that she does not feel bad other than frustration from the knee. She says that she had a second opinion for her right knee earlier this year and was recommended to have an above the knee amputation because of the chronic infection. She presented to the office with these issues and was then sent to the hospital for IV antibiotic coverage and labs.   Past Medical History:  Diagnosis Date  . Anemia   . Arthritis    Knee both knees  . Blood transfusion without reported diagnosis 2012   anemia;pt denies transfusion stated was only on iron tablet  . Cataract    left  . CKD (chronic kidney disease), stage III ([place])   . CVA (cerebral infarction)    2006  . Diabetes mellitus without complication ([place])   . Family history of anesthesia complication    sister very slow to awaken after anesthesia;severe vomiting   . Gout    left elbow  . Heart murmur   . Herpes infection  08-09-14   Saw doctor Wed. 08-09-14 Right eye  . Hyperlipidemia   . Hypertension   . Nocturia    3-4 times per night  . Stroke [facility]) 2006   x 1 no deficits noted     Past Surgical History:  Procedure Laterality Date  . ABDOMINAL HYSTERECTOMY  1983  . CARPAL TUNNEL RELEASE Right 1983  . colonscopy  June 21, 2014  . EXCISIONAL TOTAL KNEE ARTHROPLASTY WITH ANTIBIOTIC SPACERS Right 02/16/2015   Procedure: RIGHT KNEE RESECTION ARTHROPLASTY WITH ANTIBIOTIC SPACERS;  Surgeon: [name], MD;  Location: [facility];  Service: Orthopedics;  Laterality: Right;  . I&D KNEE WITH POLY EXCHANGE Right 10/02/2014   Procedure: IRRIGATION AND DEBRIDEMENT RIGHT KNEE WITH POLY EXCHANGE;  Surgeon: Gearlean Alf, MD;  Location: [facility];  Service: Orthopedics;  Laterality: Right;  . INCISION AND DRAINAGE OF WOUND Right 01/14/2017   Procedure: IRRIGATION AND DEBRIDEMENT WOUND;  Surgeon: [name], MD;  Location: [facility];  Service: Orthopedics;  Laterality: Right;  requests 54mins  . JOINT REPLACEMENT  06/2014   right knee  . nasal cauterization  2012  . PATELLAR TENDON REPAIR Right 08/11/2014   Procedure: RIGHT PATELLA TENDON REPAIR;  Surgeon: Gearlean Alf, MD;  Location: [facility];  Service: Orthopedics;  Laterality: Right;  . REIMPLANTATION OF TOTAL KNEE Right 05/23/2015   Procedure: RIGHT KNEE ARTHROPLASTY  REIMPLANTATION;  Surgeon: [name], MD;  Location: [facility];  Service: Orthopedics;  Laterality: Right;  . TOTAL KNEE ARTHROPLASTY Right 07/03/2014   Procedure: RIGHT TOTAL KNEE ARTHROPLASTY;  Surgeon: Gearlean Alf, MD;  Location: [facility];  Service: Orthopedics;  Laterality: Right;  . TUBAL LIGATION      Family History  Problem Relation Age of Onset  . Ovarian cancer Mother   . Cancer Mother   . Diabetes Son   . Diabetes Son   . Peripheral vascular disease Father        with amputation of both legs  . Hypertension Father   . Heart disease Brother   . Kidney disease Daughter   . Colon cancer  Neg Hx   . Esophageal cancer Neg Hx   . Stomach cancer Neg Hx   . Rectal cancer Neg Hx    Social History:  reports that she has never smoked. She has never used smokeless tobacco. She reports that she does not drink alcohol or use drugs.  Allergies:  Allergies  Allergen Reactions  . Aleve [Naproxen Sodium]     Heart races  . Asa [Aspirin]     Nose bleeding  . Clonidine Derivatives Other (See Comments)    Dizziness and weakness  . Shellfish Allergy Nausea And Vomiting    Medications Prior to Admission  Medication Sig Dispense Refill  . acetaminophen (TYLENOL) 500 MG tablet Take 1,000 mg by mouth every 6 (six) hours as needed for mild pain.    Marland Kitchen amLODipine (NORVASC) 10 MG tablet TAKE ONE TABLET BY MOUTH ONCE DAILY IN THE EVENING 90 tablet 1  . atorvastatin (LIPITOR) 20 MG tablet Take 1 tablet (20 mg total) by mouth every evening. 90 tablet 1  . furosemide (LASIX) 40 MG tablet Take 1 tablet (40 mg total) by mouth every morning. 30 tablet 2  . hydrALAZINE (APRESOLINE) 100 MG tablet TAKE 1 TABLET BY MOUTH THREE TIMES DAILY FOR BLOOD PRESSURE 270 tablet 1  . metFORMIN (GLUCOPHAGE-XR) 500 MG 24 hr tablet TAKE ONE (1) TABLET EACH DAY 90 tablet 1  . Multiple Vitamin (MULTIVITAMIN WITH MINERALS) TABS tablet Take 1 tablet by mouth every other day.    . Nebivolol HCl (BYSTOLIC) 20 MG TABS TAKE ONE (1) TABLET EACH DAY 90 each 0  . olmesartan (BENICAR) 40 MG tablet TAKE ONE (1) TABLET EACH DAY 90 tablet 0  . ONE TOUCH ULTRA TEST test strip Use to check glucose up to four times daily 400 each 0  . ONETOUCH DELICA LANCETS 00F MISC USE TO CHECK GLUCOSE UP TO  4 TIMES DAILY 400 each 0  . rOPINIRole (REQUIP) 1 MG tablet Take 1 tablet (1 mg total) by mouth at bedtime. For leg cramps 30 tablet 5    Review of Systems  Constitutional: Negative.   HENT: Negative.   Eyes: Negative.   Respiratory: Negative.   Cardiovascular: Negative.   Gastrointestinal: Negative.   Genitourinary: Negative.    Musculoskeletal: Positive for joint pain and myalgias. Negative for back pain, falls and neck pain.  Skin: Negative.   Neurological: Negative.   Endo/Heme/Allergies: Negative.   Psychiatric/Behavioral: Negative for depression, hallucinations, memory loss, substance abuse and suicidal ideas. The patient is nervous/anxious. The patient does not have insomnia.     Physical Exam  Constitutional: She is oriented to person, place, and time. She appears well-developed. No distress.  Morbidly obese  HENT:  Head: Normocephalic and atraumatic.  Right Ear: External ear normal.  Left Ear: External  ear normal.  Nose: Nose normal.  Mouth/Throat: Oropharynx is clear and moist.  Eyes: Conjunctivae and EOM are normal.  Neck: Normal range of motion. Neck supple.  Cardiovascular: Normal rate, regular rhythm, normal heart sounds and intact distal pulses.  No murmur heard. Respiratory: Effort normal and breath sounds normal.  GI: Soft. Bowel sounds are normal. She exhibits no distension. There is no tenderness.  Musculoskeletal:       Right knee: She exhibits decreased range of motion, swelling and erythema. Tenderness found.       Left knee: She exhibits decreased range of motion. She exhibits no swelling, no effusion and no erythema. Tenderness found. Medial joint line tenderness noted. No lateral joint line tenderness noted.  Incision over right knee healed. No drainage from the incision. She has a dime sized area lateral to the midpoint of the incision that has moderate drainage of pus. No other active areas of drainage over the right LE.   Neurological: She is alert and oriented to person, place, and time. She has normal strength. No sensory deficit.  Skin: No rash noted. She is not diaphoretic.  Psychiatric: She has a normal mood and affect. Her behavior is normal.     Assessment/Plan Recurrent infection, right total knee Discussed the case with Dr. Wynelle Link. Will admit for IV antibiotic coverage.  Will get vancomycin on board. Will monitor labs and markers for systemic infection. Will attempt to hold off on I&D of the right knee at this time. To be followed by Dr. Anne Fu partners over the weekend. Dr. Wynelle Link will recheck next week to discuss long term plan. Will get plain films of the right knee to check for any periprosthetic abnormalities. Dressing change over the abscess PRN.   Christiana Gurevich LAUREN, PA-C 08/06/2018, 5:48 PM

## 2018-08-07 ENCOUNTER — Encounter (HOSPITAL_COMMUNITY): Payer: Self-pay

## 2018-08-07 ENCOUNTER — Other Ambulatory Visit: Payer: Self-pay

## 2018-08-07 LAB — COMPREHENSIVE METABOLIC PANEL
ALT: 10 U/L (ref 0–44)
AST: 14 U/L — ABNORMAL LOW (ref 15–41)
Albumin: 3.4 g/dL — ABNORMAL LOW (ref 3.5–5.0)
Alkaline Phosphatase: 91 U/L (ref 38–126)
Anion gap: 10 (ref 5–15)
BUN: 23 mg/dL (ref 8–23)
CO2: 27 mmol/L (ref 22–32)
Calcium: 9.2 mg/dL (ref 8.9–10.3)
Chloride: 104 mmol/L (ref 98–111)
Creatinine, Ser: 0.91 mg/dL (ref 0.44–1.00)
GFR calc Af Amer: >60 mL/min (ref >60)
GFR calc non Af Amer: >60 mL/min (ref >60)
Glucose, Bld: 99 mg/dL (ref 70–99)
Potassium: 3.8 mmol/L (ref 3.5–5.1)
Sodium: 141 mmol/L (ref 135–145)
Total Bilirubin: 1 mg/dL (ref 0.3–1.2)
Total Protein: 7.2 g/dL (ref 6.5–8.1)

## 2018-08-07 LAB — GLUCOSE, CAPILLARY
GLUCOSE-CAPILLARY: 116 mg/dL — AB (ref 70–99)
GLUCOSE-CAPILLARY: 144 mg/dL — AB (ref 70–99)
Glucose-Capillary: 116 mg/dL — ABNORMAL HIGH (ref 70–99)
Glucose-Capillary: 160 mg/dL — ABNORMAL HIGH (ref 70–99)

## 2018-08-07 LAB — CBC
HCT: 34.8 % — ABNORMAL LOW (ref 36.0–46.0)
Hemoglobin: 10.9 g/dL — ABNORMAL LOW (ref 12.0–15.0)
MCH: 25.3 pg — ABNORMAL LOW (ref 26.0–34.0)
MCHC: 31.3 g/dL (ref 30.0–36.0)
MCV: 80.9 fL (ref 78.0–100.0)
Platelets: 351 10*3/uL (ref 150–400)
RBC: 4.3 MIL/uL (ref 3.87–5.11)
RDW: 15.3 % (ref 11.5–15.5)
WBC: 5.7 10*3/uL (ref 4.0–10.5)

## 2018-08-07 LAB — SEDIMENTATION RATE: Sed Rate: 55 mm/hr — ABNORMAL HIGH (ref 0–22)

## 2018-08-07 LAB — C-REACTIVE PROTEIN: CRP: <0.8 mg/dL (ref <1.0)

## 2018-08-07 NOTE — Progress Notes (Signed)
Subjective:    Patient reports pain as 3 on 0-10 scale.    Objective: Vital signs in last 24 hours: Temp:  [97.7 F (36.5 C)-98.1 F (36.7 C)] 98.1 F (36.7 C) (08/31 0500) Pulse Rate:  [59-69] 60 (08/31 0500) Resp:  [16] 16 (08/31 0500) BP: (153-208)/(76-115) 162/76 (08/31 0500) SpO2:  [97 %-98 %] 97 % (08/31 0500)  Intake/Output from previous day: 08/30 0701 - 08/31 0700 In: 967 [P.O.:120; I.V.:347; IV Piggyback:500] Out: 1200 [Urine:1200] Intake/Output this shift: No intake/output data recorded.  Recent Labs    08/06/18 1956 08/07/18 0422  HGB 10.8* 10.9*   Recent Labs    08/06/18 1956 08/07/18 0422  WBC 6.5 5.7  RBC 4.27 4.30  HCT 34.6* 34.8*  PLT 334 351   Recent Labs    08/06/18 1956 08/07/18 0422  NA 139 141  K 4.3 3.8  CL 103 104  CO2 24 27  BUN 23 23  CREATININE 1.13* 0.91  GLUCOSE 181* 99  CALCIUM 9.2 9.2   Recent Labs    08/06/18 1956  INR 1.06    Neurologically intact Neurovascular intact Intact pulses distally Dorsiflexion/Plantar flexion intact Incision: moderate drainage    Assessment/Plan:    Continue ABX therapy due to Post-op infection  Wound probed with sterile qtip.Serobloody drainage. 2 by 2 cm recess.  Continue antibiotic, dressing changes. Await cultures. Discuss with Dr. Maureen Ralphs on his return. Chronic infection. No sign of sepsis.     Beth [name] C 08/07/2018, 7:38 AM

## 2018-08-08 LAB — COMPREHENSIVE METABOLIC PANEL
ALT: 10 U/L (ref 0–44)
AST: 12 U/L — ABNORMAL LOW (ref 15–41)
Albumin: 3.2 g/dL — ABNORMAL LOW (ref 3.5–5.0)
Alkaline Phosphatase: 88 U/L (ref 38–126)
Anion gap: 12 (ref 5–15)
BUN: 21 mg/dL (ref 8–23)
CO2: 25 mmol/L (ref 22–32)
Calcium: 9 mg/dL (ref 8.9–10.3)
Chloride: 102 mmol/L (ref 98–111)
Creatinine, Ser: 1.19 mg/dL — ABNORMAL HIGH (ref 0.44–1.00)
GFR calc Af Amer: 51 mL/min — ABNORMAL LOW (ref >60)
GFR calc non Af Amer: 44 mL/min — ABNORMAL LOW (ref >60)
Glucose, Bld: 127 mg/dL — ABNORMAL HIGH (ref 70–99)
Potassium: 4.1 mmol/L (ref 3.5–5.1)
Sodium: 139 mmol/L (ref 135–145)
Total Bilirubin: 0.8 mg/dL (ref 0.3–1.2)
Total Protein: 6.8 g/dL (ref 6.5–8.1)

## 2018-08-08 LAB — CBC
HCT: 32.6 % — ABNORMAL LOW (ref 36.0–46.0)
Hemoglobin: 10.2 g/dL — ABNORMAL LOW (ref 12.0–15.0)
MCH: 25.5 pg — ABNORMAL LOW (ref 26.0–34.0)
MCHC: 31.3 g/dL (ref 30.0–36.0)
MCV: 81.5 fL (ref 78.0–100.0)
Platelets: 335 10*3/uL (ref 150–400)
RBC: 4 MIL/uL (ref 3.87–5.11)
RDW: 15 % (ref 11.5–15.5)
WBC: 6.4 10*3/uL (ref 4.0–10.5)

## 2018-08-08 LAB — C-REACTIVE PROTEIN: CRP: 0.8 mg/dL (ref <1.0)

## 2018-08-08 LAB — SEDIMENTATION RATE: Sed Rate: 48 mm/hr — ABNORMAL HIGH (ref 0–22)

## 2018-08-08 LAB — GLUCOSE, CAPILLARY
GLUCOSE-CAPILLARY: 188 mg/dL — AB (ref 70–99)
GLUCOSE-CAPILLARY: 168 mg/dL — AB (ref 70–99)
Glucose-Capillary: 162 mg/dL — ABNORMAL HIGH (ref 70–99)
Glucose-Capillary: 82 mg/dL (ref 70–99)

## 2018-08-08 MED ORDER — PANTOPRAZOLE SODIUM 40 MG PO TBEC
40.0000 mg | DELAYED_RELEASE_TABLET | Freq: Every day | ORAL | Status: DC
  Administered 2018-08-08 – 2018-08-17 (×9): 40 mg via ORAL
  Filled 2018-08-08 (×9): qty 1

## 2018-08-08 MED ORDER — VANCOMYCIN HCL 10 G IV SOLR
1250.0000 mg | INTRAVENOUS | Status: DC
  Administered 2018-08-09 – 2018-08-11 (×2): 1250 mg via INTRAVENOUS
  Filled 2018-08-08 (×3): qty 1250

## 2018-08-08 MED ORDER — METOCLOPRAMIDE HCL 10 MG PO TABS: 10.0000 mg | ORAL_TABLET | Freq: Four times a day (QID) | ORAL | Status: DC | PRN

## 2018-08-08 NOTE — Progress Notes (Signed)
Pharmacy Antibiotic Note - Follow Up  Beth [name] is a 72 y.o. female admitted on 08/06/2018 with the chief complaint of right knee drainage. She has had a longstanding history of right total knee infections.   Pharmacy has been consulted for vancomycin dosing for wound infection/prosthetic joint infection  Today, 08/08/18  Day 2 Abxs  Afebrile.  SCr 1.19, worsening  WBC stable WNL  Plan: 1) Change vanc from 750mg  IV q24 to 1250mg  IV q48 due to rise in SCr 2) Follow renal function, cultures and clinical course   Weight: 202 lb 13.2 oz (92 kg)(weight from 7/1)  Temp (24hrs), Avg:98 F (36.7 C), Min:97.4 F (36.3 C), Max:98.4 F (36.9 C)  Recent Labs  Lab 08/06/18 1837 08/06/18 1956 08/07/18 0422 08/08/18 0455  WBC  --  6.5 5.7 6.4  CREATININE 0.97 1.13* 0.91 1.19*    Estimated Creatinine Clearance: 42.9 mL/min (A) (by C-G formula based on SCr of 1.19 mg/dL (H)).    Allergies  Allergen Reactions  . Aleve [Naproxen Sodium]     Heart races  . Asa [Aspirin]     Nose bleeding  . Clonidine Derivatives Other (See Comments)    Dizziness and weakness  . Shellfish Allergy Nausea And Vomiting    Antimicrobials this admission: 8/30 vanc >>  Thank you for allowing pharmacy to be a part of this patient's care.  Adrian Saran, PharmD, BCPS Pager [phone] 08/08/2018 1:15 PM

## 2018-08-08 NOTE — Progress Notes (Signed)
LEDORA DELKER  MRN: [id number] DOB/Age: 74/26/1947 72 y.o. [facility] Procedure:      Subjective: Some nausea and vomiting after her glucophage and pain med this am  Vital Signs Temp:  [97.4 F (36.3 C)-98.4 F (36.9 C)] 98.4 F (36.9 C) (09/01 0500) Pulse Rate:  [53-63] 56 (09/01 0500) Resp:  [14-16] 15 (09/01 0500) BP: (146-197)/(68-108) 146/68 (09/01 0500) SpO2:  [94 %-97 %] 94 % (09/01 0500) Weight:  [92 kg] 92 kg (08/31 1100)  Lab Results Recent Labs    08/07/18 0422 08/08/18 0455  WBC 5.7 6.4  HGB 10.9* 10.2*  HCT 34.8* 32.6*  PLT 351 335   BMET Recent Labs    08/07/18 0422 08/08/18 0455  NA 141 139  K 3.8 4.1  CL 104 102  CO2 27 25  GLUCOSE 99 127*  BUN 23 21  CREATININE 0.91 1.19*  CALCIUM 9.2 9.0   INR  Date Value Ref Range Status  08/06/2018 1.06  Final    Comment:    Performed at [facility], [place] [address], [place], [place] [address]     Exam Dressings with scant drainage.  Otherwise lower leg and thigh with no sign of cellulitis or erythema         Plan Cont IV antibiotics  glucophage discontinued while on sliding scale and protonix and reglan added  Jenetta Loges PA-C  08/08/2018, 8:40 AM Contact # [phone]

## 2018-08-09 ENCOUNTER — Inpatient Hospital Stay (HOSPITAL_COMMUNITY): Payer: Medicare Other

## 2018-08-09 DIAGNOSIS — M7989 Other specified soft tissue disorders: Secondary | ICD-10-CM

## 2018-08-09 DIAGNOSIS — M79609 Pain in unspecified limb: Secondary | ICD-10-CM

## 2018-08-09 LAB — CBC
HCT: 34 % — ABNORMAL LOW (ref 36.0–46.0)
Hemoglobin: 10.7 g/dL — ABNORMAL LOW (ref 12.0–15.0)
MCH: 25.3 pg — ABNORMAL LOW (ref 26.0–34.0)
MCHC: 31.5 g/dL (ref 30.0–36.0)
MCV: 80.4 fL (ref 78.0–100.0)
Platelets: 345 10*3/uL (ref 150–400)
RBC: 4.23 MIL/uL (ref 3.87–5.11)
RDW: 15 % (ref 11.5–15.5)
WBC: 7.8 10*3/uL (ref 4.0–10.5)

## 2018-08-09 LAB — GLUCOSE, CAPILLARY
GLUCOSE-CAPILLARY: 124 mg/dL — AB (ref 70–99)
Glucose-Capillary: 128 mg/dL — ABNORMAL HIGH (ref 70–99)
Glucose-Capillary: 152 mg/dL — ABNORMAL HIGH (ref 70–99)
Glucose-Capillary: 188 mg/dL — ABNORMAL HIGH (ref 70–99)

## 2018-08-09 LAB — COMPREHENSIVE METABOLIC PANEL
ALT: 9 U/L (ref 0–44)
AST: 13 U/L — ABNORMAL LOW (ref 15–41)
Albumin: 3.3 g/dL — ABNORMAL LOW (ref 3.5–5.0)
Alkaline Phosphatase: 84 U/L (ref 38–126)
Anion gap: 8 (ref 5–15)
BUN: 21 mg/dL (ref 8–23)
CO2: 28 mmol/L (ref 22–32)
Calcium: 8.8 mg/dL — ABNORMAL LOW (ref 8.9–10.3)
Chloride: 102 mmol/L (ref 98–111)
Creatinine, Ser: 0.96 mg/dL (ref 0.44–1.00)
GFR calc Af Amer: >60 mL/min (ref >60)
GFR calc non Af Amer: 57 mL/min — ABNORMAL LOW (ref >60)
Glucose, Bld: 150 mg/dL — ABNORMAL HIGH (ref 70–99)
Potassium: 3.7 mmol/L (ref 3.5–5.1)
Sodium: 138 mmol/L (ref 135–145)
Total Bilirubin: 0.8 mg/dL (ref 0.3–1.2)
Total Protein: 7 g/dL (ref 6.5–8.1)

## 2018-08-09 LAB — SYNOVIAL CELL COUNT + DIFF, W/ CRYSTALS: WBC, Synovial: UNDETERMINED /mm3 (ref 0–200)

## 2018-08-09 LAB — C-REACTIVE PROTEIN: CRP: 2.6 mg/dL — ABNORMAL HIGH (ref <1.0)

## 2018-08-09 LAB — URIC ACID: Uric Acid, Serum: 5.5 mg/dL (ref 2.5–7.1)

## 2018-08-09 LAB — SEDIMENTATION RATE: Sed Rate: 64 mm/hr — ABNORMAL HIGH (ref 0–22)

## 2018-08-09 MED ORDER — ENOXAPARIN SODIUM 40 MG/0.4ML ~~LOC~~ SOLN
40.0000 mg | SUBCUTANEOUS | Status: DC
  Administered 2018-08-09: 40 mg via SUBCUTANEOUS
  Filled 2018-08-09: qty 0.4

## 2018-08-09 MED ORDER — ENOXAPARIN SODIUM 40 MG/0.4ML ~~LOC~~ SOLN
40.0000 mg | SUBCUTANEOUS | Status: DC
  Administered 2018-08-10 – 2018-08-17 (×8): 40 mg via SUBCUTANEOUS
  Filled 2018-08-09 (×8): qty 0.4

## 2018-08-09 NOTE — Progress Notes (Signed)
Patient ID: Beth [name], female   DOB: Sep 21, 1946, 72 y.o.   MRN: [id number] Aspirate shows calcium pyrophosphate per micro-Pseudogout  We will see how she does with the lavage and steroid injection and monitor Cx  Hopefully she will see improvement quickly  Tyniya Kuyper MD

## 2018-08-09 NOTE — Progress Notes (Addendum)
Subjective: Called to see pt for a cc of R UE pain.  She c/o pain in the R UE that started Friday.  She c/o pain from the shoulder to the wrist.  She says the wrist is worst.  She denies any h/o injury to the right UE.  No blood thinners since admission until this morning.  She has had one dose of lovenox.  U/S of the R UE has been completed.  She denies any implants in the shoulder.  No h/o R shoulder surgery.  Family is a bedside and is able to assist with the history.  She says the abx make her nauseated but o/w denies f/c/n/v.  She has a long h/o R knee infection and has experienced acute drainage from the anterior knee since Friday.  She has had chronic drainage from a wound at the right proximal leg for many months.  She is on vancomycin.   Objective: Vital signs in last 24 hours: Temp:  [98.7 F (37.1 C)-99.1 F (37.3 C)] 99.1 F (37.3 C) (09/02 0417) Pulse Rate:  [66-79] 79 (09/02 1051) Resp:  [16-18] 18 (09/02 0417) BP: (182-196)/(79-107) 184/107 (09/02 1051) SpO2:  [94 %-98 %] 94 % (09/02 0417)  Intake/Output from previous day: 09/01 0701 - 09/02 0700 In: 1967.7 [P.O.:960; I.V.:1007.7] Out: 2550 [Urine:2550] Intake/Output this shift: Total I/O In: 330 [P.O.:330] Out: 800 [Urine:800]  Recent Labs    08/06/18 1956 08/07/18 0422 08/08/18 0455 08/09/18 0411  HGB 10.8* 10.9* 10.2* 10.7*   Recent Labs    08/08/18 0455 08/09/18 0411  WBC 6.4 7.8  RBC 4.00 4.23  HCT 32.6* 34.0*  PLT 335 345   Recent Labs    08/08/18 0455 08/09/18 0411  NA 139 138  K 4.1 3.7  CL 102 102  CO2 25 28  BUN 21 21  CREATININE 1.19* 0.96  GLUCOSE 127* 150*  CALCIUM 9.0 8.8*   Recent Labs    08/06/18 1956  INR 1.06    PE:  [age] wd elderly female in mioderate distress due to pain.R UE generally warm  Diffuse TTP.  R shoulder motion is painful but hurts more at the wrist.  R elbow motion hurts in the wrist. NTTP at the elbow.  No focal swelling or apparent effusion at the elbow.  Skin  healthy throughout the R UE.  Fingers are swollen but are not tender.  Strength limited throughout the UE due to pain.  Wrist is TTP at the radio carpal joint.  Pain with motion.  active motor functionin the radial, median and ulnar n dist.  Radial and ulnar pulses are palpable.      Assessment/Plan: R UE swelling and pain - since the ultrasound is negative for DVT, the differential includes infection, gout and reactive synovitis.  I believe an aspirate of the wrist is indicated to eval further.  We'll continue with IV vanc.  D/c lovenox.  xrays of the wrist.  She and the family understand the plan and agree.   Riyanshi Wahab 08/09/2018, 11:58 AM    Xrays of the R shoulder, elbow and wrist were obtained and reviewed.  She has mild arthritic changes at all three joints and an effusion at the right elbow.    I re-examined the right UE.  Her pain localizes to the elbow and wrist with ROM.  She is TTP at the elbow and at the wrist.  She reports that she has had gout in her elbow before but thinks it was the left  elbow.  I've ordered a uric acid level.  D/w Dr. Maureen Ralphs.  OR unlikely for tomorrow.  He will see her in the AM.

## 2018-08-09 NOTE — Progress Notes (Signed)
At 0830, I paged [facility] regarding the pt's c/o RUE pain and swelling. Pt's right arm presents with new nonpitting swelling, weakness, and warmth. Upon assessment, the pt could barely squeeze my hand with her right hand; however, the pt did not present any signs or symptoms of a stroke. Dr. Doran Durand returned my paged and requested an ultrasound be ordered as well as Lovenox for VTE prevention. I will continue to monitor the pt.

## 2018-08-09 NOTE — Consult Note (Signed)
Reason for Consult: Right wrist pain Referring Physician: Dr. Genevie Ann is an 72 y.o. female.  HPI: Patient is a 72 year old female with significant issues regarding the lower extremities.  I reviewed her chart.  I been asked to see her in regards to her right wrist.  Dr. Doran Durand saw her today and ordered x-rays as well as a uric acid.  The patient complains of a 3-day history of wrist pain worsening today.  The patient has pain about the right wrist as her most significantly tender joint.  She has a normal white count.  Her uric acid is not significantly high but she does have a history of gout in her ankle.  I reviewed this with her at length and the findings.  She has no abrasion laceration and did not have an obvious inciting event.  She and I have reviewed these issues at length and the findings at bedside.  Patient has a history of non-insulin-dependent diabetes.  I reviewed her chart at length.  She is getting vancomycin every 48 hours.  Past Medical History:  Diagnosis Date  . Anemia   . Arthritis    Knee both knees  . Blood transfusion without reported diagnosis 2012   anemia;pt denies transfusion stated was only on iron tablet  . Cataract    left  . CKD (chronic kidney disease), stage III ([place])   . CVA (cerebral infarction)    2006  . Diabetes mellitus without complication ([place])   . Family history of anesthesia complication    sister very slow to awaken after anesthesia;severe vomiting   . Gout    left elbow  . Heart murmur   . 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 08-09-14 Right eye  . Hyperlipidemia   . Hypertension   . Nocturia    3-4 times per night  . Stroke [facility]) 2006   x 1 no deficits noted     Past Surgical History:  Procedure Laterality Date  . ABDOMINAL HYSTERECTOMY  1983  . CARPAL TUNNEL RELEASE Right 1983  . colonscopy  June 21, 2014  . EXCISIONAL TOTAL KNEE ARTHROPLASTY WITH ANTIBIOTIC SPACERS Right 02/16/2015   Procedure: RIGHT  KNEE RESECTION ARTHROPLASTY WITH ANTIBIOTIC SPACERS;  Surgeon: [name], MD;  Location: [facility];  Service: Orthopedics;  Laterality: Right;  . I&D KNEE WITH POLY EXCHANGE Right 10/02/2014   Procedure: IRRIGATION AND DEBRIDEMENT RIGHT KNEE WITH POLY EXCHANGE;  Surgeon: Gearlean Alf, MD;  Location: [facility];  Service: Orthopedics;  Laterality: Right;  . INCISION AND DRAINAGE OF WOUND Right 01/14/2017   Procedure: IRRIGATION AND DEBRIDEMENT WOUND;  Surgeon: [name], MD;  Location: [facility];  Service: Orthopedics;  Laterality: Right;  requests 14mns  . JOINT REPLACEMENT  06/2014   right knee  . nasal cauterization  2012  . PATELLAR TENDON REPAIR Right 08/11/2014   Procedure: RIGHT PATELLA TENDON REPAIR;  Surgeon: FGearlean Alf MD;  Location: [facility];  Service: Orthopedics;  Laterality: Right;  . REIMPLANTATION OF TOTAL KNEE Right 05/23/2015   Procedure: RIGHT KNEE ARTHROPLASTY REIMPLANTATION;  Surgeon: FGaynelle Arabian MD;  Location: [facility];  Service: Orthopedics;  Laterality: Right;  . TOTAL KNEE ARTHROPLASTY Right 07/03/2014   Procedure: RIGHT TOTAL KNEE ARTHROPLASTY;  Surgeon: FGearlean Alf MD;  Location: [facility];  Service: Orthopedics;  Laterality: Right;  . TUBAL LIGATION      Family History  Problem Relation Age of Onset  . Ovarian cancer Mother   .  Cancer Mother   . Diabetes Son   . Diabetes Son   . Peripheral vascular disease Father        with amputation of both legs  . Hypertension Father   . Heart disease Brother   . Kidney disease Daughter   . Colon cancer Neg Hx   . Esophageal cancer Neg Hx   . Stomach cancer Neg Hx   . Rectal cancer Neg Hx     Social History:  reports that she has never smoked. She has never used smokeless tobacco. She reports that she does not drink alcohol or use drugs.  Allergies:  Allergies  Allergen Reactions  . Aleve [Naproxen Sodium]     Heart races  . Asa [Aspirin]     Nose bleeding  . Clonidine Derivatives Other (See Comments)     Dizziness and weakness  . Shellfish Allergy Nausea And Vomiting    Medications: I have reviewed the patient's current medications.  Results for orders placed or performed during the hospital encounter of 08/06/18 (from the past 48 hour(s))  Glucose, capillary     Status: Abnormal   Collection Time: 08/07/18 10:30 PM  Result Value Ref Range   Glucose-Capillary 160 (H) 70 - 99 mg/dL  Comprehensive metabolic panel     Status: Abnormal   Collection Time: 08/08/18  4:55 AM  Result Value Ref Range   Sodium 139 135 - 145 mmol/L   Potassium 4.1 3.5 - 5.1 mmol/L   Chloride 102 98 - 111 mmol/L   CO2 25 22 - 32 mmol/L   Glucose, Bld 127 (H) 70 - 99 mg/dL   BUN 21 8 - 23 mg/dL   Creatinine, Ser 1.19 (H) 0.44 - 1.00 mg/dL   Calcium 9.0 8.9 - 10.3 mg/dL   Total Protein 6.8 6.5 - 8.1 g/dL   Albumin 3.2 (L) 3.5 - 5.0 g/dL   AST 12 (L) 15 - 41 U/L   ALT 10 0 - 44 U/L   Alkaline Phosphatase 88 38 - 126 U/L   Total Bilirubin 0.8 0.3 - 1.2 mg/dL   GFR calc non Af Amer 44 (L) >60 mL/min   GFR calc Af Amer 51 (L) >60 mL/min    Comment: (NOTE) The eGFR has been calculated using the CKD EPI equation. This calculation has not been validated in all clinical situations. eGFR's persistently <60 mL/min signify possible Chronic Kidney Disease.    Anion gap 12 5 - 15    Comment: Performed at [facility], [place] [address] [place], [place] [address]  CBC     Status: Abnormal   Collection Time: 08/08/18  4:55 AM  Result Value Ref Range   WBC 6.4 4.0 - 10.5 K/uL   RBC 4.00 3.87 - 5.11 MIL/uL   Hemoglobin 10.2 (L) 12.0 - 15.0 g/dL   HCT 32.6 (L) 36.0 - 46.0 %   MCV 81.5 78.0 - 100.0 fL   MCH 25.5 (L) 26.0 - 34.0 pg   MCHC 31.3 30.0 - 36.0 g/dL   RDW 15.0 11.5 - 15.5 %   Platelets 335 150 - 400 K/uL    Comment: Performed at [facility], [place] [address] [place], [place] [address]  C-reactive protein     Status: None   Collection Time: 08/08/18  4:55 AM  Result  Value Ref Range   CRP 0.8 <1.0 mg/dL    Comment: Performed at [facility], [place] [address], [place], [place] [address]  Sedimentation rate  Status: Abnormal   Collection Time: 08/08/18  4:55 AM  Result Value Ref Range   Sed Rate 48 (H) 0 - 22 mm/hr    Comment: Performed at [facility], [place] [address] [place], [place] [address]  Glucose, capillary     Status: Abnormal   Collection Time: 08/08/18  7:24 AM  Result Value Ref Range   Glucose-Capillary 162 (H) 70 - 99 mg/dL  Glucose, capillary     Status: Abnormal   Collection Time: 08/08/18 11:57 AM  Result Value Ref Range   Glucose-Capillary 188 (H) 70 - 99 mg/dL  Glucose, capillary     Status: None   Collection Time: 08/08/18  4:10 PM  Result Value Ref Range   Glucose-Capillary 82 70 - 99 mg/dL  Glucose, capillary     Status: Abnormal   Collection Time: 08/08/18  9:50 PM  Result Value Ref Range   Glucose-Capillary 168 (H) 70 - 99 mg/dL  Comprehensive metabolic panel     Status: Abnormal   Collection Time: 08/09/18  4:11 AM  Result Value Ref Range   Sodium 138 135 - 145 mmol/L   Potassium 3.7 3.5 - 5.1 mmol/L   Chloride 102 98 - 111 mmol/L   CO2 28 22 - 32 mmol/L   Glucose, Bld 150 (H) 70 - 99 mg/dL   BUN 21 8 - 23 mg/dL   Creatinine, Ser 0.96 0.44 - 1.00 mg/dL   Calcium 8.8 (L) 8.9 - 10.3 mg/dL   Total Protein 7.0 6.5 - 8.1 g/dL   Albumin 3.3 (L) 3.5 - 5.0 g/dL   AST 13 (L) 15 - 41 U/L   ALT 9 0 - 44 U/L   Alkaline Phosphatase 84 38 - 126 U/L   Total Bilirubin 0.8 0.3 - 1.2 mg/dL   GFR calc non Af Amer 57 (L) >60 mL/min   GFR calc Af Amer >60 >60 mL/min    Comment: (NOTE) The eGFR has been calculated using the CKD EPI equation. This calculation has not been validated in all clinical situations. eGFR's persistently <60 mL/min signify possible Chronic Kidney Disease.    Anion gap 8 5 - 15    Comment: Performed at [facility], [place] [address], [place],  [place] [address]  CBC     Status: Abnormal   Collection Time: 08/09/18  4:11 AM  Result Value Ref Range   WBC 7.8 4.0 - 10.5 K/uL   RBC 4.23 3.87 - 5.11 MIL/uL   Hemoglobin 10.7 (L) 12.0 - 15.0 g/dL   HCT 34.0 (L) 36.0 - 46.0 %   MCV 80.4 78.0 - 100.0 fL   MCH 25.3 (L) 26.0 - 34.0 pg   MCHC 31.5 30.0 - 36.0 g/dL   RDW 15.0 11.5 - 15.5 %   Platelets 345 150 - 400 K/uL    Comment: Performed at [facility], [place] [address], [place], [place] [address]  C-reactive protein     Status: Abnormal   Collection Time: 08/09/18  4:11 AM  Result Value Ref Range   CRP 2.6 (H) <1.0 mg/dL    Comment: Performed at [facility], [place] [address], [place], [place] [address]  Sedimentation rate     Status: Abnormal   Collection Time: 08/09/18  4:11 AM  Result Value Ref Range   Sed Rate 64 (H) 0 - 22 mm/hr    Comment: Performed at [facility], [place] [address] [place], [place] [address]  Glucose, capillary  Status: Abnormal   Collection Time: 08/09/18  7:33 AM  Result Value Ref Range   Glucose-Capillary 128 (H) 70 - 99 mg/dL  Glucose, capillary     Status: Abnormal   Collection Time: 08/09/18 11:45 AM  Result Value Ref Range   Glucose-Capillary 124 (H) 70 - 99 mg/dL  Glucose, capillary     Status: Abnormal   Collection Time: 08/09/18  4:51 PM  Result Value Ref Range   Glucose-Capillary 152 (H) 70 - 99 mg/dL  Uric acid     Status: None   Collection Time: 08/09/18  6:26 PM  Result Value Ref Range   Uric Acid, Serum 5.5 2.5 - 7.1 mg/dL    Comment: Performed at [facility], [place] [address] [place], [place] [address]    Dg Shoulder Right  Result Date: 08/09/2018 CLINICAL DATA:  72 year old female with diffuse right upper extremity pain for the past several days from the shoulder to the wrist. No known injury. EXAM: RIGHT SHOULDER - 2+ VIEW COMPARISON:  Concurrently obtained radiographs of the right elbow and rest FINDINGS:  Low-lying right humeral head on the frontal view. The humeral head appears to remain located with respect to the glenoid on the scapular Y-view. No definite acute fracture. Mild degenerative change at the acromioclavicular joint. IMPRESSION: 1. Low-lying humeral head, mild subluxation not excluded. 2. No acute fracture. 3. Mild acromioclavicular joint degenerative change. Electronically Signed   By: Jacqulynn Cadet M.D.   On: 08/09/2018 14:27   Dg Elbow Complete Right (3+view)  Result Date: 08/09/2018 CLINICAL DATA:  72 year old female with diffuse right upper extremity pain most significant at the wrist. No known injury. EXAM: RIGHT ELBOW - COMPLETE 3+ VIEW COMPARISON:  Concurrently obtained radiographs of the wrist and shoulder FINDINGS: Positive for elevation of both the anterior and posterior distal humeral fat pad suggesting the presence of an elbow joint effusion. No definite acute fracture identified. Very mild degenerative change present with small ossification in the region of the coronoid process of the ulna. IMPRESSION: 1. Positive for elbow joint effusion. Degenerative effusion and, in the appropriate clinical setting infection or hemarthrosis are all possibilities. No definite fracture visualized. 2. Very mild degenerative change present at the ulnar trochlear joint. Electronically Signed   By: Jacqulynn Cadet M.D.   On: 08/09/2018 14:29   Dg Wrist Complete Right  Result Date: 08/09/2018 CLINICAL DATA:  72 year old female with diffuse right upper extremity pain from the shoulder to the wrist but most significant at the wrist. No known injury. EXAM: RIGHT WRIST - COMPLETE 3+ VIEW COMPARISON:  Concurrently obtained radiographs of the elbow and shoulder. FINDINGS: No evidence of acute fracture or malalignment. Degenerative osteoarthritis is present at the thumb [facility] joint, MCP joint and interphalangeal joint. No changes of inflammatory arthropathy. 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 Electronically Signed   By: Jacqulynn Cadet M.D.   On: 08/09/2018 14:31    ROS Blood pressure (!) 189/96, pulse 74, temperature 100.2 F (37.9 C), temperature source Oral, resp. rate (!) 24, weight 92 kg, SpO2 95 %. Physical Exam Patient has tenderness on exam localized to the wrist.  She appears to have a synovitic effusion in my opinion.  She is sensate about the fingers.  FDP FDS and extensor function is intact EPL and FPL are intact.  Elbow has no signs of infection there is no bursal accumulation.  Forearm compartments are soft.  Her upper arm is fairly stable.  Vascular examination is competent.  I reviewed this with her at length and the findings.  Opposite extremity has IV access and is stable.  I did remove her armbands from the affected right arm as they were tight.  Given the findings as well as x-rays which I have personally reviewed I am going to plan for an aspiration. Assessment/Plan: Right wrist pain with motion tenderness and findings suspicious for crystalline arthropathy versus infection.  She was consented at bedside and I then performed careful prep and drape of her wrist with alcohol prep followed by Betadine.  Betadine was allowed to dry and I then wheeled the skin into areas and performed aspiration.  Cloudy fluid was removed.  I removed 2 sets of fluid and I am going to send this for crystalline analysis and aerobic and anaerobic cultures.  I discussed this personally with the lab with the help of nursing staff.  The patient had the wrist lavaged with lidocaine and I lavaged until the wrist was clear.  Given her findings it is not out of the question to see gout in this scenario however if there is any significant abnormalities on Gram stain I would certainly consider formal irrigation and debridement in the operative theater when Dr. Elmyra Ricks performs her surgery.  At present time we will see how she does over the course of  12 to 24 hours.  I did inject Celestone into the area as a challenge injection to see if in fact she improves as typically this will nearly cure a gout issue.   The wrist was bandaged in a bulky bandage and will place her at rest and elevation tonight.  The lab will call me with the results tonight.  We will check on her progress.  Should any issues arise she will notify us.  Greater than 45 to 60 minutes are spent with her.  All questions have been addressed. Satira Anis Katharin Schneider III 08/09/2018, 7:59 PM

## 2018-08-09 NOTE — Progress Notes (Signed)
Pt complaining of R arm pain. Michela Pitcher it has been getting worse for past couple of days. RUE is more swollen then it has been. It is noted to be swollen and very tender to touch. Will monitor closely.

## 2018-08-09 NOTE — Progress Notes (Addendum)
Subjective:     Patient reports pain as moderate.  Reports that she is experiencing R hand, elbow, and shoulder pain/swelling since yesterday.  States that R UE pain is more severe than R knee.  Denies any new injury.  Denies any hx of R UE issues, or C-spine pathology.   States that she can't use her R arm due to pain.  Hx of gout, but states that she hasn't had a flare up in several years and denies consuming any triggers.  Denies fever, chills, N/V, SOB, CP.  Objective:   VITALS:  Temp:  [98.7 F (37.1 C)-99.1 F (37.3 C)] 99.1 F (37.3 C) (09/02 0417) Pulse Rate:  [66-70] 70 (09/02 0417) Resp:  [16-18] 18 (09/02 0417) BP: (182-196)/(79-94) 193/89 (09/02 0417) SpO2:  [94 %-98 %] 94 % (09/02 0417)  General: WDWN patient in NAD. Psych:  Appropriate mood and affect. Neuro:  A&O x 3, Moving all extremities, sensation intact to light touch HEENT:  EOMs intact Chest:  Even non-labored respirations Skin:  Dressing D/I with scant dry drainage, no rashes or lesions Extremities: warm/dry, moderate edema about the R knee with mild edema into R hand, no erythema or echymosis.  No lymphadenopathy. Pulses: Popliteus and radial 2+ MSK:  ROM: fixed R knee flexion contracture.  Patient not willing to move R elbow or shoulder due to pain, MMT: able to perform quad set, 3/5 grip strength R UE, (-) Homan's    LABS Recent Labs    08/06/18 1956 08/07/18 0422 08/08/18 0455 08/09/18 0411  HGB 10.8* 10.9* 10.2* 10.7*  WBC 6.5 5.7 6.4 7.8  PLT 334 351 335 345   Recent Labs    08/08/18 0455 08/09/18 0411  NA 139 138  K 4.1 3.7  CL 102 102  CO2 25 28  BUN 21 21  CREATININE 1.19* 0.96  GLUCOSE 127* 150*   Recent Labs    08/06/18 1956  INR 1.06     Assessment/Plan:    R knee abscess  Continue IV ABX Dr. Wynelle Link to reassess this week Spoke to Dr. Doran Durand in regards to acute R UE pain and swelling.  Her pain is fairly diffuse and not localized to a single joint.  With no MOI we will  continue to monitor for resolution of symptoms.   [name] PA-C [facility] Office:  [phone]

## 2018-08-09 NOTE — Progress Notes (Signed)
RUE venous duplex prelim: negative for DVT and SVT. Jeane Cashatt Eunice, RDMS, RVT  

## 2018-08-10 ENCOUNTER — Inpatient Hospital Stay (HOSPITAL_COMMUNITY): Payer: Medicare Other

## 2018-08-10 LAB — SEDIMENTATION RATE: Sed Rate: 78 mm/hr — ABNORMAL HIGH (ref 0–22)

## 2018-08-10 LAB — COMPREHENSIVE METABOLIC PANEL
ALT: 10 U/L (ref 0–44)
AST: 13 U/L — ABNORMAL LOW (ref 15–41)
Albumin: 3.2 g/dL — ABNORMAL LOW (ref 3.5–5.0)
Alkaline Phosphatase: 89 U/L (ref 38–126)
Anion gap: 11 (ref 5–15)
BUN: 20 mg/dL (ref 8–23)
CO2: 27 mmol/L (ref 22–32)
Calcium: 8.9 mg/dL (ref 8.9–10.3)
Chloride: 96 mmol/L — ABNORMAL LOW (ref 98–111)
Creatinine, Ser: 0.93 mg/dL (ref 0.44–1.00)
GFR calc Af Amer: >60 mL/min (ref >60)
GFR calc non Af Amer: 59 mL/min — ABNORMAL LOW (ref >60)
Glucose, Bld: 240 mg/dL — ABNORMAL HIGH (ref 70–99)
Potassium: 3.8 mmol/L (ref 3.5–5.1)
Sodium: 134 mmol/L — ABNORMAL LOW (ref 135–145)
Total Bilirubin: 1.3 mg/dL — ABNORMAL HIGH (ref 0.3–1.2)
Total Protein: 7.6 g/dL (ref 6.5–8.1)

## 2018-08-10 LAB — CBC
HCT: 37.2 % (ref 36.0–46.0)
Hemoglobin: 11.7 g/dL — ABNORMAL LOW (ref 12.0–15.0)
MCH: 25.2 pg — ABNORMAL LOW (ref 26.0–34.0)
MCHC: 31.5 g/dL (ref 30.0–36.0)
MCV: 80.2 fL (ref 78.0–100.0)
Platelets: 358 10*3/uL (ref 150–400)
RBC: 4.64 MIL/uL (ref 3.87–5.11)
RDW: 14.9 % (ref 11.5–15.5)
WBC: 11 10*3/uL — ABNORMAL HIGH (ref 4.0–10.5)

## 2018-08-10 LAB — GLUCOSE, CAPILLARY
GLUCOSE-CAPILLARY: 280 mg/dL — AB (ref 70–99)
GLUCOSE-CAPILLARY: 173 mg/dL — AB (ref 70–99)
Glucose-Capillary: 178 mg/dL — ABNORMAL HIGH (ref 70–99)
Glucose-Capillary: 257 mg/dL — ABNORMAL HIGH (ref 70–99)

## 2018-08-10 LAB — C-REACTIVE PROTEIN: CRP: 19.1 mg/dL — ABNORMAL HIGH (ref <1.0)

## 2018-08-10 NOTE — Progress Notes (Signed)
Subjective: Patient states that the swelling and drainage at the top of her knee began last week and drained a lot yesterday. The distal area has drained chronically. She reports no pain today   Objective: Vital signs in last 24 hours: Temp:  [97.6 F (36.4 C)-100.2 F (37.9 C)] 97.6 F (36.4 C) (09/03 0806) Pulse Rate:  [70-77] 70 (09/03 0806) Resp:  [24] 24 (09/02 1441) BP: (159-194)/(85-103) 159/85 (09/03 0806) SpO2:  [94 %-100 %] 96 % (09/03 0806)  Intake/Output from previous day: 09/02 0701 - 09/03 0700 In: 1950.9 [P.O.:540; I.V.:1160.9; IV Piggyback:250] Out: 1550 [Urine:1550] Intake/Output this shift: Total I/O In: 440 [P.O.:240; I.V.:200] Out: 100 [Urine:100]  Recent Labs    08/08/18 0455 08/09/18 0411 08/10/18 0417  HGB 10.2* 10.7* 11.7*   Recent Labs    08/09/18 0411 08/10/18 0417  WBC 7.8 11.0*  RBC 4.23 4.64  HCT 34.0* 37.2  PLT 345 358   Recent Labs    08/09/18 0411 08/10/18 0417  NA 138 134*  K 3.7 3.8  CL 102 96*  CO2 28 27  BUN 21 20  CREATININE 0.96 0.93  GLUCOSE 150* 240*  CALCIUM 8.8* 8.9   No results for input(s): LABPT, INR in the last 72 hours.  Neurologically intact Neurovascular intact Compartment soft Has pinpoint area of drainage distal and medial to the knee and a small superficial area near top of the incision which is not draining today. There is no surrounding erythema. No palpable effusion  X-rays show the femoral component has loosened and there is lucency anterior to the proximal part of the tibial component  CT confirms findings of the plain films and there is soft tissue thickening in the infrapatellar region. There are no soft tissue abscesses and no evidence of infection in the femoral or tibial canals beyond the prosthetic stems. 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 She had 2 other opinions suggesting amputation vs  fusion after debridement. SHe definitely needs a resection arthroplasty and antibiotic spacer to treat the infection and then fusion vs possible reimplantation depending on soft tissue status and bone status. She does not want an amputation and I do not feel it is necessary at this stage. It may be a salvage option in the future if all else fails but she is not wanting to do that now. She will discuss this with her family and will let me know tomorrow morning. If she decides to proceed we will plan on surgery tomorrow afternoon   [name] 08/10/2018, 11:59 AM

## 2018-08-11 LAB — CBC
HCT: 32.8 % — ABNORMAL LOW (ref 36.0–46.0)
Hemoglobin: 10.4 g/dL — ABNORMAL LOW (ref 12.0–15.0)
MCH: 25.1 pg — ABNORMAL LOW (ref 26.0–34.0)
MCHC: 31.7 g/dL (ref 30.0–36.0)
MCV: 79.2 fL (ref 78.0–100.0)
Platelets: 351 10*3/uL (ref 150–400)
RBC: 4.14 MIL/uL (ref 3.87–5.11)
RDW: 14.8 % (ref 11.5–15.5)
WBC: 12.5 10*3/uL — ABNORMAL HIGH (ref 4.0–10.5)

## 2018-08-11 LAB — COMPREHENSIVE METABOLIC PANEL
ALT: 11 U/L (ref 0–44)
AST: 13 U/L — ABNORMAL LOW (ref 15–41)
Albumin: 2.7 g/dL — ABNORMAL LOW (ref 3.5–5.0)
Alkaline Phosphatase: 76 U/L (ref 38–126)
Anion gap: 10 (ref 5–15)
BUN: 42 mg/dL — ABNORMAL HIGH (ref 8–23)
CO2: 28 mmol/L (ref 22–32)
Calcium: 8.8 mg/dL — ABNORMAL LOW (ref 8.9–10.3)
Chloride: 97 mmol/L — ABNORMAL LOW (ref 98–111)
Creatinine, Ser: 1.18 mg/dL — ABNORMAL HIGH (ref 0.44–1.00)
GFR calc Af Amer: 51 mL/min — ABNORMAL LOW (ref >60)
GFR calc non Af Amer: 44 mL/min — ABNORMAL LOW (ref >60)
Glucose, Bld: 156 mg/dL — ABNORMAL HIGH (ref 70–99)
Potassium: 4 mmol/L (ref 3.5–5.1)
Sodium: 135 mmol/L (ref 135–145)
Total Bilirubin: 0.5 mg/dL (ref 0.3–1.2)
Total Protein: 6.6 g/dL (ref 6.5–8.1)

## 2018-08-11 LAB — GLUCOSE, CAPILLARY
GLUCOSE-CAPILLARY: 182 mg/dL — AB (ref 70–99)
Glucose-Capillary: 160 mg/dL — ABNORMAL HIGH (ref 70–99)
Glucose-Capillary: 208 mg/dL — ABNORMAL HIGH (ref 70–99)
Glucose-Capillary: 109 mg/dL — ABNORMAL HIGH (ref 70–99)

## 2018-08-11 LAB — C-REACTIVE PROTEIN: CRP: 13.1 mg/dL — ABNORMAL HIGH (ref <1.0)

## 2018-08-11 LAB — SEDIMENTATION RATE: Sed Rate: 89 mm/hr — ABNORMAL HIGH (ref 0–22)

## 2018-08-11 NOTE — Progress Notes (Signed)
Subjective: Still with right knee pain. Drainage has subsided   Objective: Vital signs in last 24 hours: Temp:  [97.6 F (36.4 C)-98.5 F (36.9 C)] 98.5 F (36.9 C) (09/04 0530) Pulse Rate:  [54-70] 54 (09/04 0530) Resp:  [16-18] 18 (09/04 0530) BP: (144-162)/(81-85) 162/81 (09/04 0530) SpO2:  [93 %-97 %] 95 % (09/04 0530)  Intake/Output from previous day: 09/03 0701 - 09/04 0700 In: 1991.5 [P.O.:1010; I.V.:981.5] Out: 1025 [Urine:1025] Intake/Output this shift: No intake/output data recorded.  Recent Labs    08/09/18 0411 08/10/18 0417 08/11/18 0521  HGB 10.7* 11.7* 10.4*   Recent Labs    08/10/18 0417 08/11/18 0521  WBC 11.0* 12.5*  RBC 4.64 4.14  HCT 37.2 32.8*  PLT 358 351   Recent Labs    08/10/18 0417 08/11/18 0521  NA 134* 135  K 3.8 4.0  CL 96* 97*  CO2 27 28  BUN 20 42*  CREATININE 0.93 1.18*  GLUCOSE 240* 156*  CALCIUM 8.9 8.8*   No results for input(s): LABPT, INR in the last 72 hours.  Neurologically intact No cellulitis present Compartment soft No warmth or effusion. No drainage distally. 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 Stimson and 2 of her children today. We discussed resection arthroplasty and various reconstruction options. It is not a good time for her or her family to proceed with surgery at this time but they realize that it will be necessary to treat the infection and loose implant. Her acute situation has resolved with the antibiotics. I am awaiting the final culture from the fluid taken at my office and will base her antibiotic off of that . We discussed possible discharge tomorrow on oral suppression and then proceeding with surgery at a time more convenient for them as long as his does no acutely flare again.   Pilar Plate Aimar Borghi 08/11/2018, 7:37 AM

## 2018-08-11 NOTE — Care Management Important Message (Signed)
Important Message  Patient Details  Name: ELVERDA WENDEL MRN: [id number] Date of Birth: Apr 25, 1946   Medicare Important Message Given:  Yes    Kerin Salen 08/11/2018, 12:54 [place] Message  Patient Details  Name: MARAL LAMPE MRN: [id number] Date of Birth: [birth year]   Medicare Important Message Given:  Yes    Kerin Salen 08/11/2018, 12:54 PM

## 2018-08-11 NOTE — Progress Notes (Signed)
Pharmacy Antibiotic Note - Follow Up  Beth [name] is a 72 y.o. female admitted on 08/06/2018 with the chief complaint of right knee drainage. She has had a longstanding history of right total knee infections.   Pharmacy has been consulted for vancomycin dosing for wound infection/prosthetic joint infection  Today, 08/11/18  Day 5 Abxs  Afebrile.  SCr 1.18  WBC 12.5  Plan: 1) Continue vanc 1250mg  IV q48  2) Follow renal function, cultures and clinical course   Weight: 202 lb 13.2 oz (92 kg)(weight from 7/1)  Temp (24hrs), Avg:98.3 F (36.8 C), Min:98.2 F (36.8 C), Max:98.5 F (36.9 C)  Recent Labs  Lab 08/07/18 0422 08/08/18 0455 08/09/18 0411 08/10/18 0417 08/11/18 0521  WBC 5.7 6.4 7.8 11.0* 12.5*  CREATININE 0.91 1.19* 0.96 0.93 1.18*    Estimated Creatinine Clearance: 43.3 mL/min (A) (by C-G formula based on SCr of 1.18 mg/dL (H)).    Allergies  Allergen Reactions  . Aleve [Naproxen Sodium]     Heart races  . Asa [Aspirin]     Nose bleeding  . Clonidine Derivatives Other (See Comments)    Dizziness and weakness  . Shellfish Allergy Nausea And Vomiting    Antimicrobials this admission: 8/30 vanc >>  Thank you for allowing pharmacy to be a part of this patient's care.  Dolly Rias RPh 08/11/2018, 8:21 AM Pager [phone]

## 2018-08-12 LAB — GLUCOSE, CAPILLARY
GLUCOSE-CAPILLARY: 170 mg/dL — AB (ref 70–99)
Glucose-Capillary: 130 mg/dL — ABNORMAL HIGH (ref 70–99)
Glucose-Capillary: 156 mg/dL — ABNORMAL HIGH (ref 70–99)
Glucose-Capillary: 151 mg/dL — ABNORMAL HIGH (ref 70–99)

## 2018-08-12 MED ORDER — DOXYCYCLINE HYCLATE 100 MG PO TABS: 100.0000 mg | ORAL_TABLET | Freq: Two times a day (BID) | ORAL | 0 refills | Status: DC

## 2018-08-12 MED ORDER — TRAMADOL HCL 50 MG PO TABS: 50.0000 mg | ORAL_TABLET | Freq: Four times a day (QID) | ORAL | 0 refills | Status: DC | PRN

## 2018-08-12 MED ORDER — DOXYCYCLINE HYCLATE 100 MG PO TABS
100.0000 mg | ORAL_TABLET | Freq: Two times a day (BID) | ORAL | Status: DC
  Administered 2018-08-13 – 2018-08-17 (×9): 100 mg via ORAL
  Filled 2018-08-12 (×10): qty 1

## 2018-08-12 MED ORDER — OXYCODONE HCL 5 MG PO TABS: 5.0000 mg | ORAL_TABLET | Freq: Four times a day (QID) | ORAL | 0 refills | Status: DC | PRN

## 2018-08-12 MED ORDER — ADULT MULTIVITAMIN W/MINERALS CH
1.0000 | ORAL_TABLET | ORAL | Status: DC
  Administered 2018-08-13 – 2018-08-17 (×3): 1 via ORAL
  Filled 2018-08-12 (×4): qty 1

## 2018-08-12 MED ORDER — AMLODIPINE BESYLATE 10 MG PO TABS
10.0000 mg | ORAL_TABLET | Freq: Once | ORAL | Status: AC
  Administered 2018-08-12: 10 mg via ORAL
  Filled 2018-08-12: qty 1

## 2018-08-12 MED ORDER — ENOXAPARIN SODIUM 40 MG/0.4ML ~~LOC~~ SOLN: 40.0000 mg | SUBCUTANEOUS | 0 refills | Status: DC

## 2018-08-12 MED ORDER — ROPINIROLE HCL 1 MG PO TABS: 1.0000 mg | ORAL_TABLET | Freq: Every day | ORAL | 5 refills | Status: DC

## 2018-08-12 NOTE — Progress Notes (Signed)
   Subjective:   Patient reports pain as mild to moderate..   Patient seen in rounds by Dr. Wynelle Link. Patient is well, and has had no acute complaints or problems other than pain in the right knee. Still no drainage. Plan is to go Home after hospital stay.  Objective: Vital signs in last 24 hours: Temp:  [97.7 F (36.5 C)-98.3 F (36.8 C)] 98.1 F (36.7 C) (09/05 0600) Pulse Rate:  [56-64] 64 (09/05 0600) Resp:  [17] 17 (09/04 2133) BP: (139-190)/(76-90) 190/90 (09/05 0600) SpO2:  [97 %-99 %] 97 % (09/04 2133) Weight:  [98.4 kg] 98.4 kg (09/04 2300)  Intake/Output from previous day:  Intake/Output Summary (Last 24 hours) at 08/12/2018 0840 Last data filed at 08/12/2018 0752 Gross per 24 hour  Intake 2346.79 ml  Output 1950 ml  Net 396.79 ml    Intake/Output this shift: Total I/O In: -  Out: 200 [Urine:200]  Labs: Recent Labs    08/10/18 0417 08/11/18 0521  HGB 11.7* 10.4*   Recent Labs    08/10/18 0417 08/11/18 0521  WBC 11.0* 12.5*  RBC 4.64 4.14  HCT 37.2 32.8*  PLT 358 351   Recent Labs    08/10/18 0417 08/11/18 0521  NA 134* 135  K 3.8 4.0  CL 96* 97*  CO2 27 28  BUN 20 42*  CREATININE 0.93 1.18*  GLUCOSE 240* 156*  CALCIUM 8.9 8.8*   Exam: General - Patient is Alert and Oriented Extremity - Neurologically intact Neurovascular intact Sensation intact distally Dorsiflexion/Plantar flexion intact Dressing/Incision - clean, dry, no drainage Motor Function - intact, moving foot and toes well on exam.   Past Medical History:  Diagnosis Date  . Anemia   . Arthritis    Knee both knees  . Blood transfusion without reported diagnosis 2012   anemia;pt denies transfusion stated was only on iron tablet  . Cataract    left  . CKD (chronic kidney disease), stage III ([place])   . CVA (cerebral infarction)    2006  . Diabetes mellitus without complication ([place])   . Family history of anesthesia complication    sister very slow to awaken after  anesthesia;severe vomiting   . Gout    left elbow  . Heart murmur   . 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 08-09-14 Right eye  . Hyperlipidemia   . Hypertension   . Nocturia    3-4 times per night  . Stroke ([place]) 2006   x 1 no deficits noted     Assessment/Plan:    Assessment: right knee prosthetic joint infection  Plan: Dr. Wynelle Link discussed treatment options with both the patient and her two children yesterday. They elected to proceed with conservative treatment to avoid surgery at this time. She will discharge to home today on oral suppression of Doxycycline 100 mg BID for one month. We will plan to proceed with surgery in the future when it is more convenient for both the patient and her family as long as problems do not arise.   Theresa Duty, PA-C Orthopedic Surgery 08/12/2018, 8:40 AM

## 2018-08-12 NOTE — Evaluation (Signed)
Physical Therapy Evaluation Patient Details Name: Beth [name] MRN: [id number] DOB: [birth year] Today's Date: 08/12/2018   History of Present Illness  Pt is a 72 YO female admitted for abscess on RLE associated with complications due to former TKR. Pt's R TKR complications include R TKR in 2015, I&D and polyexchange 2015, I&D and polyexchange 2016, reimplantation 2016, and abscess 2018. Pt with PMH of anemia, OA, cataracts, CKD III, CVA 2006, DM II, heart murmur, HLD, HTN. Surgical history includes carpal tunnel release and previously listed R knee surgeries.  Clinical Impression   Pt presents with deconditioning, decreased activity tolerance, extreme difficulty performing transfers requiring max assist +2, and unsteadiness on feet. Pt unable to ambulate with PT today due to LE weakness and fatigue. PT to follow acutely to address deficits. PT recommending SNF due to severity of deficits, lack of help at home due to pt's granddaughter working third shift and sleeping during the day, and the pt's difficulty with all mobility at this time. Pt was reluctant to recommendation at first, but realizes that she is unsafe to return home due to current physical status.     Follow Up Recommendations SNF;Supervision for mobility/OOB    Equipment Recommendations  None recommended by PT    Recommendations for Other Services       Precautions / Restrictions Precautions Precautions: Fall Restrictions Weight Bearing Restrictions: No      Mobility  Bed Mobility Overal bed mobility: Needs Assistance             General bed mobility comments: up in chair upon arrival to room   Transfers Overall transfer level: Needs assistance Equipment used: Rolling walker (2 wheeled) Transfers: Stand Pivot Transfers Sit to Stand: Max assist;+2 physical assistance Stand pivot transfers: Max assist       General transfer comment: Pt requires max assist to stand for trunk elevation, hip extension, knee  extension, and steadying upon standing. Sit to stand x3, first attempt pt abruptly sat back in the chair. Second attempt was a successful stand pivot to the [facility]. Pt did not extend hips and knees in standing, and took 2 very short steps before tiring and needing to sit.   Ambulation/Gait Ambulation/Gait assistance: (unable to perform)              Stairs            Wheelchair Mobility    Modified Rankin (Stroke Patients Only)       Balance Overall balance assessment: Needs assistance Sitting-balance support: Feet supported;Bilateral upper extremity supported Sitting balance-[name]: Poor     Standing balance support: Bilateral upper extremity supported Standing balance-[name]: Poor Standing balance comment: relies on RW and PT to support                              Pertinent Vitals/Pain Pain Assessment: 0-10 Pain Score: 4  Pain Location: R knee  Pain Descriptors / Indicators: Aching Pain Intervention(s): Limited activity within patient's tolerance;Repositioned;Monitored during session    Home Living Family/patient expects to be discharged to:: Private residence Living Arrangements: Alone Available Help at Discharge: Family;Available PRN/intermittently(granddaughter lives with her, she works 3rd shift, sleeps all day, and pt is home alone at night. Son helps as needed too) Type of Home: House Home Access: Ramped entrance     Home Layout: One level Home Equipment: Bedside commode;Walker - 2 wheels;Cane - single point;Shower seat;Wheelchair - manual  Prior Function Level of Independence: Needs assistance   Gait / Transfers Assistance Needed: walker in the home for 10-15 ft distances, uses w/c to get groceries with granddaughter   ADL's / Homemaking Assistance Needed: Granddaughter and sister do cooking, someone comes in and cleans house         Hand Dominance   Dominant Hand: Right    Extremity/Trunk Assessment   Upper  Extremity Assessment Upper Extremity Assessment: Generalized weakness    Lower Extremity Assessment Lower Extremity Assessment: Generalized weakness LLE Deficits / Details: Pt complaining of left leg "numbness" due to laying in the bed for so long        Communication   Communication: No difficulties  Cognition Arousal/Alertness: Awake/alert Behavior During Therapy: WFL for tasks assessed/performed Overall Cognitive Status: Within Functional Limits for tasks assessed                                        General Comments      Exercises     Assessment/Plan    PT Assessment Patient needs continued PT services  PT Problem List Decreased strength;Pain;Decreased activity tolerance;Decreased knowledge of use of DME;Decreased balance;Decreased safety awareness;Decreased mobility       PT Treatment Interventions DME instruction;Therapeutic activities;Gait training;Therapeutic exercise;Patient/family education;Balance training;Functional mobility training    PT Goals (Current goals can be found in the Care Plan section)  Acute Rehab PT Goals PT Goal Formulation: With patient Time For Goal Achievement: 08/26/18 Potential to Achieve Goals: Good    Frequency Min 2X/week   Barriers to discharge        Co-evaluation               AM-PAC PT "6 Clicks" Daily Activity  Outcome Measure Difficulty turning over in bed (including adjusting bedclothes, sheets and blankets)?: Unable Difficulty moving from lying on back to sitting on the side of the bed? : Unable Difficulty sitting down on and standing up from a chair with arms (e.g., wheelchair, bedside commode, etc,.)?: Unable Help needed moving to and from a bed to chair (including a wheelchair)?: A Lot Help needed walking in hospital room?: Total Help needed climbing 3-5 steps with a railing? : Total 6 Click Score: 7    End of Session Equipment Utilized During Treatment: Gait belt Activity Tolerance:  Patient limited by fatigue;Patient limited by lethargy Patient left: in chair;with call bell/phone within reach(No chair alarm in pt's chair, PT informed pt to call nurse when she needs to get up. Nurse tech confirms pt always calls for physical assist. ) Nurse Communication: Mobility status PT Visit Diagnosis: Unsteadiness on feet (R26.81);Muscle weakness (generalized) (M62.81)    Time: 1710-1740 PT Time Calculation (min) (ACUTE ONLY): 30 min   Charges:   PT Evaluation $PT Eval Low Complexity: 1 Low PT Treatments $Therapeutic Activity: 8-22 mins       Malaina Mortellaro Conception Chancy, PT, DPT  Pager # [phone]   Jakyiah Briones D Surya Schroeter 08/12/2018, 7:39 PM

## 2018-08-12 NOTE — Discharge Instructions (Signed)
° °  BLOOD CLOT PREVENTION: Inject Lovenox once a day for 7 days following discharge. Then take one 81 mg aspirin once a day for three weeks. Then discontinue aspirin.   ANTIBIOTICS: Take one 100 mg Doxycycline tablet two times a day for one month.  FOLLOW-UP: Follow-up in the office in 2 weeks with Dr. Wynelle Link. Call the office at [phone] to schedule this.   Call the office if you have any problems, questions, or concerns.

## 2018-08-13 LAB — CBC
HEMATOCRIT: 34.7 % — AB (ref 36.0–46.0)
Hemoglobin: 11.1 g/dL — ABNORMAL LOW (ref 12.0–15.0)
MCH: 25.5 pg — ABNORMAL LOW (ref 26.0–34.0)
MCHC: 32 g/dL (ref 30.0–36.0)
MCV: 79.6 fL (ref 78.0–100.0)
Platelets: 377 10*3/uL (ref 150–400)
RBC: 4.36 MIL/uL (ref 3.87–5.11)
RDW: 14.7 % (ref 11.5–15.5)
WBC: 8.1 10*3/uL (ref 4.0–10.5)

## 2018-08-13 LAB — GLUCOSE, CAPILLARY
GLUCOSE-CAPILLARY: 197 mg/dL — AB (ref 70–99)
GLUCOSE-CAPILLARY: 94 mg/dL (ref 70–99)
Glucose-Capillary: 127 mg/dL — ABNORMAL HIGH (ref 70–99)
Glucose-Capillary: 145 mg/dL — ABNORMAL HIGH (ref 70–99)

## 2018-08-13 LAB — BASIC METABOLIC PANEL
Anion gap: 11 (ref 5–15)
BUN: 24 mg/dL — AB (ref 8–23)
CALCIUM: 9 mg/dL (ref 8.9–10.3)
CO2: 28 mmol/L (ref 22–32)
Chloride: 99 mmol/L (ref 98–111)
Creatinine, Ser: 0.8 mg/dL (ref 0.44–1.00)
GFR calc Af Amer: >60 mL/min (ref >60)
GLUCOSE: 129 mg/dL — AB (ref 70–99)
Potassium: 4.2 mmol/L (ref 3.5–5.1)
SODIUM: 138 mmol/L (ref 135–145)

## 2018-08-13 MED ORDER — HYDRALAZINE HCL 20 MG/ML IJ SOLN
10.0000 mg | Freq: Four times a day (QID) | INTRAMUSCULAR | Status: DC | PRN
  Administered 2018-08-14: 10 mg via INTRAVENOUS
  Filled 2018-08-13: qty 1

## 2018-08-13 MED ORDER — CLONIDINE HCL 0.2 MG/24HR TD PTWK
0.2000 mg | MEDICATED_PATCH | TRANSDERMAL | Status: DC
  Administered 2018-08-13: 0.2 mg via TRANSDERMAL
  Filled 2018-08-13: qty 1

## 2018-08-13 MED ORDER — HYDRALAZINE HCL 50 MG PO TABS
50.0000 mg | ORAL_TABLET | Freq: Three times a day (TID) | ORAL | Status: DC
  Administered 2018-08-13 – 2018-08-14 (×2): 50 mg via ORAL
  Filled 2018-08-13 (×2): qty 1

## 2018-08-13 MED ORDER — ISOSORBIDE MONONITRATE ER 30 MG PO TB24
30.0000 mg | ORAL_TABLET | Freq: Every day | ORAL | Status: DC
  Administered 2018-08-14 – 2018-08-15 (×2): 30 mg via ORAL
  Filled 2018-08-13 (×4): qty 1

## 2018-08-13 NOTE — Progress Notes (Signed)
   Subjective: Hospital day - 7 Patient reports pain as mild.   Patient seen in rounds with Dr. Wynelle Link. Patient is having problems with elevated blood pressures through the night. Plan is to go Skilled nursing facility after hospital stay.  Objective: Vital signs in last 24 hours: Temp:  [98.2 F (36.8 C)-98.7 F (37.1 C)] 98.7 F (37.1 C) (09/06 0552) Pulse Rate:  [68-72] 68 (09/06 0552) Resp:  [16-20] 20 (09/06 0552) BP: (154-211)/(90-104) 200/96 (09/06 0552) SpO2:  [97 %-98 %] 97 % (09/06 0544)  Intake/Output from previous day:  Intake/Output Summary (Last 24 hours) at 08/13/2018 0750 Last data filed at 08/13/2018 0744 Gross per 24 hour  Intake 2114.23 ml  Output 2175 ml  Net -60.77 ml    Intake/Output this shift: Total I/O In: -  Out: 500 [Urine:500]  Labs: Recent Labs    08/11/18 0521 08/13/18 0658  HGB 10.4* 11.1*   Recent Labs    08/11/18 0521 08/13/18 0658  WBC 12.5* 8.1  RBC 4.14 4.36  HCT 32.8* 34.7*  PLT 351 377   Recent Labs    08/11/18 0521 08/13/18 0658  NA 135 138  K 4.0 4.2  CL 97* 99  CO2 28 28  BUN 42* 24*  CREATININE 1.18* 0.80  GLUCOSE 156* 129*  CALCIUM 8.8* 9.0   No results for input(s): LABPT, INR in the last 72 hours.  EXAM General - Patient is Alert and Oriented Extremity - Neurologically intact Neurovascular intact Sensation intact distally Dorsiflexion/Plantar flexion intact Motor Function - intact, moving foot and toes well on exam.   Past Medical History:  Diagnosis Date  . Anemia   . Arthritis    Knee both knees  . Blood transfusion without reported diagnosis 2012   anemia;pt denies transfusion stated was only on iron tablet  . Cataract    left  . CKD (chronic kidney disease), stage III ([place])   . CVA (cerebral infarction)    2006  . Diabetes mellitus without complication ([place])   . Family history of anesthesia complication    sister very slow to awaken after anesthesia;severe vomiting   . Gout    left  elbow  . Heart murmur   . 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 08-09-14 Right eye  . Hyperlipidemia   . Hypertension   . Nocturia    3-4 times per night  . Stroke [facility]) 2006   x 1 no deficits noted     Assessment/Plan: Hospital day - 7 Active Problems:   Abscess of right leg   Infection of total right knee replacement (HCC)  Estimated body mass index is 36.11 kg/m as calculated from the following:   Height as of this encounter: 5\' 5"  (1.651 m).   Weight as of this encounter: 98.4 kg.  Blood pressure medications ordered early this AM. Will continue to monitor, if BP does not improve will likely need medicine consult.  Creatinine improved at 0.80 from 1.18 on 09/04.  IV Vancomycin discontinued, will begin oral abx regimen today of 100 mg Doxycycline BID x 1 month.   Pt will require discharge to SNF for 24/7 assistance with mobility and ambulation. Possible discharge today pending bed availability if blood pressures are improved and pt is stable.  Theresa Duty, PA-C Orthopaedic Surgery 08/13/2018, 7:50 AM

## 2018-08-13 NOTE — Discharge Summary (Addendum)
Physician Discharge Summary   Patient ID: Beth [name] MRN: [id number] DOB/AGE: 1946/03/16 72 y.o.  Admit date: 08/06/2018 Discharge date: 08/17/2018  Primary Diagnosis: Right knee prosthetic joint infection   Admission Diagnoses:  Past Medical History:  Diagnosis Date  . Anemia   . Arthritis    Knee both knees  . Blood transfusion without reported diagnosis 2012   anemia;pt denies transfusion stated was only on iron tablet  . Cataract    left  . CKD (chronic kidney disease), stage III ([place])   . CVA (cerebral infarction)    2006  . Diabetes mellitus without complication ([place])   . Family history of anesthesia complication    sister very slow to awaken after anesthesia;severe vomiting   . Gout    left elbow  . Heart murmur   . 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 08-09-14 Right eye  . Hyperlipidemia   . Hypertension   . Nocturia    3-4 times per night  . Stroke ([place]) 2006   x 1 no deficits noted    Discharge Diagnoses:   Principal Problem:   Infection of total right knee replacement ([place]) Active Problems:   Essential hypertension   Diabetes type 2, controlled ([place])   Abscess of right leg   Steroid-induced hyperglycemia  Estimated body mass index is 36.11 kg/m as calculated from the following:   Height as of this encounter: 5\' 5"  (1.651 m).   Weight as of this encounter: 98.4 kg.   Consults: Hospitalist  HPI: Beth [name] is a 72 year old female who presents with the chief complaint of right knee drainage. She has had a longstanding history of right total knee infections. She had a right TKA in 2015. She developed an infection and had a I&D with polyexchange in late 2015. She had another I&D with polyexchange in 2016. She did not respond well and required excision with placement of antibiotic spacers. She improved and cultures were negative. She had a reimplantation in June 2016. She did well for a couple of years until she developed a superficial abscess in early  2018. She had an I&D of the abscess at that time over the proximal tibia. She reports drainage from that area on a daily basis since that surgery. She reports that towards the end of last week she developed a "bump" on her knee with associated erythema. She says that yesterday the area "popped" and she has had pus draining from the area since. She denies fevers and chills. Temp in the office 98.5. She reports that she does not feel bad other than frustration from the knee. She says that she had a second opinion for her right knee earlier this year and was recommended to have an above the knee amputation because of the chronic infection. She presented to the office with these issues and was then sent to the hospital for IV antibiotic coverage and labs.   Laboratory Data: No results displayed because visit has over 200 results.       X-Rays:Dg Shoulder Right  Result Date: 08/09/2018 CLINICAL DATA:  72 year old female with diffuse right upper extremity pain for the past several days from the shoulder to the wrist. No known injury. EXAM: RIGHT SHOULDER - 2+ VIEW COMPARISON:  Concurrently obtained radiographs of the right elbow and rest FINDINGS: Low-lying right humeral head on the frontal view. The humeral head appears to remain located with respect to the glenoid on the scapular Y-view. No definite acute fracture. Mild degenerative  change at the acromioclavicular joint. IMPRESSION: 1. Low-lying humeral head, mild subluxation not excluded. 2. No acute fracture. 3. Mild acromioclavicular joint degenerative change. Electronically Signed   By: Jacqulynn Cadet M.D.   On: 08/09/2018 14:27   Dg Elbow Complete Right (3+view)  Result Date: 08/09/2018 CLINICAL DATA:  72 year old female with diffuse right upper extremity pain most significant at the wrist. No known injury. EXAM: RIGHT ELBOW - COMPLETE 3+ VIEW COMPARISON:  Concurrently obtained radiographs of the wrist and shoulder FINDINGS: Positive for elevation of  both the anterior and posterior distal humeral fat pad suggesting the presence of an elbow joint effusion. No definite acute fracture identified. Very mild degenerative change present with small ossification in the region of the coronoid process of the ulna. IMPRESSION: 1. Positive for elbow joint effusion. Degenerative effusion and, in the appropriate clinical setting infection or hemarthrosis are all possibilities. No definite fracture visualized. 2. Very mild degenerative change present at the ulnar trochlear joint. Electronically Signed   By: Jacqulynn Cadet M.D.   On: 08/09/2018 14:29   Dg Wrist Complete Right  Result Date: 08/09/2018 CLINICAL DATA:  72 year old female with diffuse right upper extremity pain from the shoulder to the wrist but most significant at the wrist. No known injury. EXAM: RIGHT WRIST - COMPLETE 3+ VIEW COMPARISON:  Concurrently obtained radiographs of the elbow and shoulder. FINDINGS: No evidence of acute fracture or malalignment. Degenerative osteoarthritis is present at the thumb [name] joint, MCP joint and interphalangeal joint. No changes of inflammatory arthropathy. 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 Electronically Signed   By: Jacqulynn Cadet M.D.   On: 08/09/2018 14:31   Dg Knee 1-2 Views Right  Result Date: 08/10/2018 CLINICAL DATA:  Right-sided knee pain. Anterior drainage for 4 days. EXAM: RIGHT KNEE - 1-2 VIEW COMPARISON:  CT of the right knee 08/10/2018. FINDINGS: A constrained knee replacement is present. The femoral component has shifted in angulation dorsally. There is also lucency around the tibial component. A large effusion is present. Atherosclerotic changes are present. IMPRESSION: 1. Large knee effusion with lucency surrounding the femoral and tibial components. This is highly concerning for infection. Please see CT report of the same day. 2. No acute fracture. 3. Atherosclerosis. Electronically Signed    By: San Morelle M.D.   On: 08/10/2018 08:56   Dg Knee 1-2 Views Right  Result Date: 08/06/2018 CLINICAL DATA:  Initial evaluation for acute infection and drainage, history of total knee replacement. EXAM: RIGHT KNEE - 1-2 VIEW COMPARISON:  Prior MRI from 12/24/2007. FINDINGS: Right total knee arthroplasty in place. There is periprosthetic lucency at the tibial plateau portion of the prosthesis, age indeterminate, but could reflect loosening or infection. No other definite hardware complication. Diffuse soft tissue swelling about the knee. Heterotopic calcification at the posterior tibial plateau and superior patella either posttraumatic or postoperative in nature. No acute fracture or dislocation. Prominent vascular calcifications noted. IMPRESSION: 1. Lucency along the inferior margin of the tibial prosthesis, age indeterminate, but could reflect loosening or infection. 2. Diffuse soft tissue swelling about the knee. 3. No acute fracture. 4. Extensive atherosclerosis. Electronically Signed   By: Jeannine Boga M.D.   On: 08/06/2018 22:36   Ct Knee Right Wo Contrast  Result Date: 08/10/2018 CLINICAL DATA:  Recurrent knee infections. EXAM: CT OF THE RIGHT KNEE WITHOUT CONTRAST TECHNIQUE: Multidetector CT imaging of the right knee was performed according to the standard protocol. Multiplanar CT image reconstructions were also  generated. COMPARISON:  Radiographs dated 08/06/2018 FINDINGS: Bones/Joint/Cartilage There is abnormal lucency surrounding the stem of the femoral component with central lucency in the medullary canal extending above the proximal tip of the prosthesis. There is circumferential periosteal reaction along the distal shaft of the femur. There is slight bone resorption adjacent to the anterior aspect of the tibial component best seen on image 42 of series 6 and image 52 of series 7. There is slight lucency of stem in the proximal tibia. There is no appreciable patellar prosthetic  component. However, this area is partially obscured by metallic artifact. Muscles and Tendons There is atrophy of the muscles in the distal thigh and proximal calf. Soft tissues There is abnormal soft tissue density in the region of Hoffa's fat pad posterior to the patellar tendon. This is ill-defined. This could represent cellulitis or abscess. IMPRESSION: 1. 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 2. Abnormal soft tissue density in Hoffa's fat pad in the region of the patellar tendon consistent with cellulitis and or abscess. 3. Slight bone resorption around the anterior aspect of the proximal portion of the tibial component of the prosthesis, which could also represent chronic osteomyelitis. Electronically Signed   By: Lorriane Shire M.D.   On: 08/10/2018 09:11    EKG: Orders placed or performed during the hospital encounter of 02/28/16  . ED EKG  . ED EKG  . EKG 12-Lead  . EKG 12-Lead  . EKG     Hospital Course: SAMEEHA ROCKEFELLER is a 72 y.o. who was admitted to [facility] and started on DVT prophylaxis in the form of Lovenox. IV antibiotics were started. Pt was seen through the weekend and was found to have continuous drainage from the superior portion of the knee. She had a history of chronic drainage from the distal portion of the incision. On 09/03 she was seen by Dr. Wynelle Link, who discussed treatment options of a resection arthroplasty and antibiotic spacer to treat the infection versus discharge home with oral antibiotics to revisit surgery in the future. After discussion with her family, Ms. Mccleave decided that it was not a good time for her or her family to proceed with surgery but realized that it would be necessary in the future. The acute situation of swelling and drainage had resolved with antibiotics and it was decided to plan for discharge with oral antibiotics. Patient was evaluated by PT on  hospital day 6 and it was decided that patient would best benefit from discharge to a skilled nursing facility, where she could benefit from 24/7 assistance and supervision with mobility. Patient was seen during rounds on hospital day 7 and was noted to have had issues with elevated blood pressures through the night, with systolic BP reaching 151 mmHg. This did not improve throughout the day despite multi antihypertensive medications, and a hospitalist consult was placed for management. Pt was seen through the weekend by medicine for BP management and blood sugar control. Pt was noted to have an acute gout flare in the right wrist and left elbow. Pt was started on prednisone dose pack for this. This pain was much improved after beginning oral steroids. After blood pressure was controlled, patient was medically stable and ready for discharge to [facility] on 08/17/2018.  Diet: Cardiac diet Activity: WBAT Follow-up: in 2 weeks with Dr. Wynelle Link Follow-up with PCP for continuation of BP medications Disposition: Skilled nursing facility Discharged Condition: stable  Discharge Instructions    Call MD / Call 911   Complete by:  As directed    If you experience chest pain or shortness of breath, CALL 911 and be transported to the hospital emergency room.  If you develope a fever above 101 F, pus (white drainage) or increased drainage or redness at the wound, or calf pain, call your surgeon's office.   Constipation Prevention   Complete by:  As directed    Drink plenty of fluids.  Prune juice may be helpful.  You may use a stool softener, such as Colace (over the counter) 100 mg twice a day.  Use MiraLax (over the counter) for constipation as needed.   Diet - low sodium heart healthy   Complete by:  As directed    Weight bearing as tolerated   Complete by:  As directed      Allergies as of 08/17/2018      Reactions   Aleve [naproxen Sodium]    Heart races   Asa [aspirin]    Nose bleeding    Clonidine Derivatives Other (See Comments)   Dizziness and weakness   Shellfish Allergy Nausea And Vomiting      Medication List    TAKE these medications   acetaminophen 500 MG tablet Commonly known as:  TYLENOL Take 1,000 mg by mouth every 6 (six) hours as needed for mild pain.   amLODipine 10 MG tablet Commonly known as:  NORVASC TAKE ONE TABLET BY MOUTH ONCE DAILY IN THE EVENING   atorvastatin 20 MG tablet Commonly known as:  LIPITOR Take 1 tablet (20 mg total) by mouth every evening.   cloNIDine 0.2 mg/24hr patch Commonly known as:  CATAPRES - Dosed in mg/24 hr Place 1 patch (0.2 mg total) onto the skin once a week. Start taking on:  08/20/2018   doxycycline 100 MG tablet Commonly known as:  VIBRA-TABS Take 1 tablet (100 mg total) by mouth every 12 (twelve) hours for 26 days. Take one 100 mg tablet by mouth two times a day until October 6th.   enoxaparin 40 MG/0.4ML injection Commonly known as:  LOVENOX Inject 0.4 mLs (40 mg total) into the skin daily for 4 days.   furosemide 40 MG tablet Commonly known as:  LASIX Take 1 tablet (40 mg total) by mouth every morning.   hydrALAZINE 100 MG tablet Commonly known as:  APRESOLINE TAKE 1 TABLET BY MOUTH THREE TIMES DAILY FOR BLOOD PRESSURE   isosorbide mononitrate 30 MG 24 hr tablet Commonly known as:  IMDUR Take 1 tablet (30 mg total) by mouth daily. Start taking on:  08/18/2018   labetalol 200 MG tablet Commonly known as:  NORMODYNE Take 1 tablet (200 mg total) by mouth 2 (two) times daily.   metFORMIN 500 MG 24 hr tablet Commonly known as:  GLUCOPHAGE-XR TAKE ONE (1) TABLET EACH DAY What changed:    how much to take  how to take this  when to take this  additional instructions   multivitamin with minerals Tabs tablet Take 1 tablet by mouth every other day.   Nebivolol HCl 20 MG Tabs TAKE ONE (1) TABLET EACH DAY What changed:    how much to take  how to take this  when to take this  additional  instructions   olmesartan 40 MG tablet Commonly known as:  BENICAR TAKE ONE (1) TABLET EACH DAY What changed:    how much to take  how to take this  when to take this  additional instructions   ONE TOUCH ULTRA TEST test strip Generic drug:  glucose blood Use to check glucose up to four times daily   ONETOUCH DELICA LANCETS 74B Misc USE TO CHECK GLUCOSE UP TO  4 TIMES DAILY   oxyCODONE 5 MG immediate release tablet Commonly known as:  Oxy IR/ROXICODONE Take 1-2 tablets (5-10 mg total) by mouth every 6 (six) hours as needed for severe pain.   predniSONE 5 MG (21) Tbpk tablet Commonly known as:  STERAPRED UNI-PAK 21 TAB Taper 4x daily to 1x over 10 days.   rOPINIRole 1 MG tablet Commonly known as:  REQUIP Take 1 tablet (1 mg total) by mouth at bedtime. For leg cramps   traMADol 50 MG tablet Commonly known as:  ULTRAM Take 1-2 tablets (50-100 mg total) by mouth every 6 (six) hours as needed for moderate pain.   VITAMIN B-12 PO Take 1 tablet by mouth daily.            Durable Medical Equipment  (From admission, onward)         Start     Ordered   08/16/18 0700  For home use only DME Hospital bed  Once    Question Answer Comment  The above medical condition requires: Patient requires the ability to reposition frequently   Bed type Semi-electric   Trapeze Bar Yes      08/16/18 0659           Discharge Care Instructions  (From admission, onward)         Start     Ordered   08/17/18 0000  Weight bearing as tolerated     08/17/18 1215         Follow-up Information    [name], MD. Schedule an appointment as soon as possible for a visit on 08/26/2018.   Specialty:  Orthopedic Surgery Contact information: [address] [place] [address] [phone]           Signed: Theresa Duty, PA-C Orthopedic Surgery 08/17/2018, 12:19 PM

## 2018-08-13 NOTE — Consult Note (Signed)
Patient Demographics:    Beth [name], is a 72 y.o. female  MRN: [id number]   DOB - [birth year]  Admit Date - 08/06/2018  Outpatient Primary MD for the patient is Claretta Fraise, MD   Assessment & Plan:    Principal Problem:   Infection of total right knee replacement [facility]) Active Problems:   Essential hypertension   Diabetes type 2, controlled ([place])   Abscess of right leg   Plan:- 1)Stage II Hypertension--- BP is not at goal, even at  home systolic blood pressure runs between 170 to  200 mmhg range, she tells me she is compliant with her antihypertensive medications.  It appears that patient will need multiple agents to achieve BP control, will try amlodipine 10 mg daily, hydralazine 50 mg 3 times daily/isosorbide 30 mg daily, clonidine patch 0.2 mg q. 7 days,, continue Avapro 300 mg daily, continue Bystolic 20 mg daily per home regimen , may use IV Hydralazine 10 mg  Every 4 hours Prn for systolic blood pressure over 160 mmhg. need to be compliant with low-salt diet emphasized   2)Rt Knee infection-management per orthopedic team  Thank you Dr. Maureen Ralphs for this hospitalist medical consult, hospitalist team will follow with you to help manage patient's blood pressure  With History of - Reviewed by me  Past Medical History:  Diagnosis Date  . Anemia   . Arthritis    Knee both knees  . Blood transfusion without reported diagnosis 2012   anemia;pt denies transfusion stated was only on iron tablet  . Cataract    left  . CKD (chronic kidney disease), stage III ([place])   . CVA (cerebral infarction)    2006  . Diabetes mellitus without complication ([place])   . Family history of anesthesia complication    sister very slow to awaken after anesthesia;severe vomiting   . Gout    left elbow  . Heart murmur   . 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 08-09-14 Right eye  . Hyperlipidemia   . Hypertension   . Nocturia    3-4 times per night  . Stroke [facility]) 2006   x 1 no deficits noted       Past Surgical History:  Procedure Laterality Date  . ABDOMINAL HYSTERECTOMY  1983  . CARPAL TUNNEL RELEASE Right 1983  . colonscopy  June 21, 2014  . EXCISIONAL TOTAL KNEE ARTHROPLASTY WITH ANTIBIOTIC SPACERS Right 02/16/2015   Procedure: RIGHT KNEE RESECTION ARTHROPLASTY WITH ANTIBIOTIC SPACERS;  Surgeon: [name], MD;  Location: [facility];  Service: Orthopedics;  Laterality: Right;  . I&D KNEE WITH POLY EXCHANGE Right 10/02/2014   Procedure: IRRIGATION AND DEBRIDEMENT RIGHT KNEE WITH POLY EXCHANGE;  Surgeon: Gearlean Alf, MD;  Location: [facility];  Service: Orthopedics;  Laterality: Right;  . INCISION AND DRAINAGE OF WOUND Right 01/14/2017   Procedure: IRRIGATION AND DEBRIDEMENT WOUND;  Surgeon: [name], MD;  Location: [facility];  Service: Orthopedics;  Laterality: Right;  requests 107mins  . JOINT REPLACEMENT  06/2014   right knee  . nasal cauterization  2012  . PATELLAR TENDON REPAIR Right 08/11/2014   Procedure: RIGHT PATELLA TENDON REPAIR;  Surgeon: Gearlean Alf, MD;  Location: [facility];  Service: Orthopedics;  Laterality: Right;  . REIMPLANTATION OF TOTAL KNEE Right 05/23/2015   Procedure: RIGHT KNEE ARTHROPLASTY REIMPLANTATION;  Surgeon: [name], MD;  Location: [facility];  Service: Orthopedics;  Laterality: Right;  . TOTAL KNEE ARTHROPLASTY Right 07/03/2014   Procedure: RIGHT TOTAL KNEE ARTHROPLASTY;  Surgeon: Gearlean Alf, MD;  Location: [facility];  Service: Orthopedics;  Laterality: Right;  . TUBAL LIGATION        No chief complaint on file. Elevated blood pressure   HPI:    Beth [name]  is a 72 y.o. female with past medical history relevant for stage II hypertension degenerative joint disease, history of prior CVA in 2006 and history of chronic anemia who was admitted on 08/06/2018 to the  orthopedic team for infected right knee  Orthopedic team apparently was planning to discharge patient to rehab but her blood pressure remained elevated persistently so hospitalist consult was called to help manage BP better  Patient denies headaches, palpitations, dizziness, shortness of breath at rest, denies lower extremity edema, denies orthopnea or paroxysmal nocturnal dyspnea.  No Visual disturbance  She is voiding okay, additional history obtained from patient granddaughter at bedside    Review of systems:    In addition to the HPI above,   A full Review of  Systems was done, all other systems reviewed are negative except as noted above in HPI , .   Social History:  Reviewed by me    Social History   Tobacco Use  . Smoking status: Never Smoker  . Smokeless tobacco: Never Used  Substance Use Topics  . Alcohol use: No     Family History :  Reviewed by me    Family History  Problem Relation Age of Onset  . Ovarian cancer Mother   . Cancer Mother   . Diabetes Son   . Diabetes Son   . Peripheral vascular disease Father        with amputation of both legs  . Hypertension Father   . Heart disease Brother   . Kidney disease Daughter   . Colon cancer Neg Hx   . Esophageal cancer Neg Hx   . Stomach cancer Neg Hx   . Rectal cancer Neg Hx     Home Medications:   Prior to Admission medications   Medication Sig Start Date End Date Taking? Authorizing Provider  amLODipine (NORVASC) 10 MG tablet TAKE ONE TABLET BY MOUTH ONCE DAILY IN THE EVENING 06/07/18  Yes Stacks, Cletus Gash, MD  atorvastatin (LIPITOR) 20 MG tablet Take 1 tablet (20 mg total) by mouth every evening. 06/07/18  Yes Stacks, Cletus Gash, MD  Cyanocobalamin (VITAMIN B-12 PO) Take 1 tablet by mouth daily.   Yes [provider]  furosemide (LASIX) 40 MG tablet Take 1 tablet (40 mg total) by mouth every morning. 06/07/18  Yes Stacks, Cletus Gash, MD  hydrALAZINE (APRESOLINE) 100 MG tablet TAKE 1 TABLET BY MOUTH THREE  TIMES DAILY FOR BLOOD PRESSURE 06/07/18  Yes Stacks, Cletus Gash, MD  metFORMIN (GLUCOPHAGE-XR) 500 MG 24 hr tablet TAKE ONE (1) TABLET EACH DAY Patient taking differently: Take 500 mg by mouth daily with breakfast.  04/16/18  Yes Claretta Fraise, MD  Multiple Vitamin (MULTIVITAMIN WITH MINERALS) TABS tablet Take  1 tablet by mouth every other day.   Yes [provider]  Nebivolol HCl (BYSTOLIC) 20 MG TABS TAKE ONE (1) TABLET EACH DAY Patient taking differently: Take 20 mg by mouth daily.  06/07/18  Yes Stacks, Cletus Gash, MD  olmesartan (BENICAR) 40 MG tablet TAKE ONE (1) TABLET EACH DAY Patient taking differently: Take 40 mg by mouth daily.  06/07/18  Yes Claretta Fraise, MD  acetaminophen (TYLENOL) 500 MG tablet Take 1,000 mg by mouth every 6 (six) hours as needed for mild pain.    [provider]  doxycycline (VIBRA-TABS) 100 MG tablet Take 1 tablet (100 mg total) by mouth every 12 (twelve) hours. Take one 100 mg tablet by mouth two times a day for one month. 08/13/18   Edmisten, Kristie L, PA  enoxaparin (LOVENOX) 40 MG/0.4ML injection Inject 0.4 mLs (40 mg total) into the skin daily for 7 days. Then take one 81 mg aspirin once a day for three weeks. Then discontinue aspirin. 08/12/18 08/19/18  Edmisten, Ok Anis, PA  ONE TOUCH ULTRA TEST test strip Use to check glucose up to four times daily 12/09/17   Claretta Fraise, MD  [facility] DELICA LANCETS 47W MISC USE TO CHECK GLUCOSE UP TO  4 TIMES DAILY 12/09/17   Claretta Fraise, MD  oxyCODONE (OXY IR/ROXICODONE) 5 MG immediate release tablet Take 1-2 tablets (5-10 mg total) by mouth every 6 (six) hours as needed for severe pain. 08/12/18   Edmisten, Kristie L, PA  rOPINIRole (REQUIP) 1 MG tablet Take 1 tablet (1 mg total) by mouth at bedtime. For leg cramps 08/12/18   Edmisten, Kristie L, PA  traMADol (ULTRAM) 50 MG tablet Take 1-2 tablets (50-100 mg total) by mouth every 6 (six) hours as needed for moderate pain. 08/12/18   Edmisten, Ok Anis, PA     Allergies:       Allergies  Allergen Reactions  . Aleve [Naproxen Sodium]     Heart races  . Asa [Aspirin]     Nose bleeding  . Clonidine Derivatives Other (See Comments)    Dizziness and weakness  . Shellfish Allergy Nausea And Vomiting     Physical Exam:   Vitals  Blood pressure (!) 183/92, pulse 66, temperature 98.7 F (37.1 C), temperature source Oral, resp. rate 20, height 5\' 5"  (1.651 m), weight 98.4 kg, SpO2 97 %.  Physical Examination: General appearance - alert, obese appearing, and in no distress  Mental status - alert, oriented to person, place, and time,  Eyes - sclera anicteric Neck - supple, no JVD elevation , Chest - clear  to auscultation bilaterally, symmetrical air movement,  Heart - S1 and S2 normal,  Abdomen - soft, nontender, nondistended, no masses or organomegaly Neurological - screening mental status exam normal, neck supple without rigidity, cranial nerves II through XII intact, DTR's normal and symmetric Extremities -right knee swelling with dressing over it, intact peripheral pulses  Skin - warm, dry     Data Review:    CBC Recent Labs  Lab 08/06/18 1956  08/08/18 0455 08/09/18 0411 08/10/18 0417 08/11/18 0521 08/13/18 0658  WBC 6.5   < > 6.4 7.8 11.0* 12.5* 8.1  HGB 10.8*   < > 10.2* 10.7* 11.7* 10.4* 11.1*  HCT 34.6*   < > 32.6* 34.0* 37.2 32.8* 34.7*  PLT 334   < > 335 345 358 351 377  MCV 81.0   < > 81.5 80.4 80.2 79.2 79.6  MCH 25.3*   < > 25.5* 25.3* 25.2*  25.1* 25.5*  MCHC 31.2   < > 31.3 31.5 31.5 31.7 32.0  RDW 15.2   < > 15.0 15.0 14.9 14.8 14.7  LYMPHSABS 2.1  --   --   --   --   --   --   MONOABS 0.6  --   --   --   --   --   --   EOSABS 0.2  --   --   --   --   --   --   BASOSABS 0.0  --   --   --   --   --   --    < > = values in this interval not displayed.   ------------------------------------------------------------------------------------------------------------------  Chemistries  Recent Labs  Lab 08/07/18 0422  08/08/18 0455 08/09/18 0411 08/10/18 0417 08/11/18 0521 08/13/18 0658  NA 141 139 138 134* 135 138  K 3.8 4.1 3.7 3.8 4.0 4.2  CL 104 102 102 96* 97* 99  CO2 27 25 28 27 28 28   GLUCOSE 99 127* 150* 240* 156* 129*  BUN 23 21 21 20  42* 24*  CREATININE 0.91 1.19* 0.96 0.93 1.18* 0.80  CALCIUM 9.2 9.0 8.8* 8.9 8.8* 9.0  AST 14* 12* 13* 13* 13*  --   ALT 10 10 9 10 11   --   ALKPHOS 91 88 84 89 76  --   BILITOT 1.0 0.8 0.8 1.3* 0.5  --    ------------------------------------------------------------------------------------------------------------------ estimated creatinine clearance is 71.7 mL/min (by C-G formula based on SCr of 0.8 mg/dL). ------------------------------------------------------------------------------------------------------------------ No results for input(s): TSH, T4TOTAL, T3FREE, THYROIDAB in the last 72 hours.  Invalid input(s): FREET3   Coagulation profile Recent Labs  Lab 08/06/18 1956  INR 1.06   ------------------------------------------------------------------------------------------------------------------- No results for input(s): DDIMER in the last 72 hours. -------------------------------------------------------------------------------------------------------------------  Cardiac Enzymes No results for input(s): CKMB, TROPONINI, MYOGLOBIN in the last 168 hours.  Invalid input(s): CK ------------------------------------------------------------------------------------------------------------------ No results found for: BNP   ---------------------------------------------------------------------------------------------------------------  Urinalysis    Component Value Date/Time   COLORURINE YELLOW 02/11/2017 2258   APPEARANCEUR Clear 04/03/2017 1615   LABSPEC 1.010 02/11/2017 2258   PHURINE 6.0 02/11/2017 2258   GLUCOSEU Negative 04/03/2017 1615   HGBUR LARGE (A) 02/11/2017 2258   BILIRUBINUR Negative 04/03/2017 1615   KETONESUR NEGATIVE  02/11/2017 2258   PROTEINUR 2+ (A) 04/03/2017 1615   PROTEINUR 100 (A) 02/11/2017 2258   UROBILINOGEN 1.0 04/30/2015 1400   NITRITE Negative 04/03/2017 1615   NITRITE NEGATIVE 02/11/2017 2258   LEUKOCYTESUR Negative 04/03/2017 1615    ----------------------------------------------------------------------------------------------------------------   Imaging Results:    No results found.  Radiological Exams on Admission: No results found.  DVT Prophylaxis -SCD  /Lovenox AM Labs Ordered, also please review Full Orders  Family Communication: Admission, patients condition and plan of care including tests being ordered have been discussed with the patient and grandaughter who indicate understanding and agree with the plan   Code Status - Full Code  Likely DC to  SNF  Condition   stable  Roxan Hockey M.D on 08/13/2018 at 6:32 PM Pager---[phone] Go to [email or web address] - password [email or web address] for contact info  [facility] - Office  [phone]

## 2018-08-13 NOTE — Progress Notes (Signed)
Spoke with nurse, BP is still elevated at 191/88 mmHg despite multiple antihypertensive medications. Will obtain consult from hospitalist for further management.

## 2018-08-13 NOTE — Progress Notes (Signed)
21:57 Received order for Norvasc 10 mg po X 1 for BP 199/97.  BP 154/90 by midnight and slept well this shift.   05:50 BP 200/96 declined need for pain med this AM, non-symptomatic and restful. On-Call agrees she will be seen soon on rounds by PA.

## 2018-08-14 DIAGNOSIS — T8453XA Infection and inflammatory reaction due to internal right knee prosthesis, initial encounter: Principal | ICD-10-CM

## 2018-08-14 DIAGNOSIS — E119 Type 2 diabetes mellitus without complications: Secondary | ICD-10-CM

## 2018-08-14 DIAGNOSIS — L02415 Cutaneous abscess of right lower limb: Secondary | ICD-10-CM

## 2018-08-14 DIAGNOSIS — I10 Essential (primary) hypertension: Secondary | ICD-10-CM

## 2018-08-14 LAB — AEROBIC/ANAEROBIC CULTURE (SURGICAL/DEEP WOUND)

## 2018-08-14 LAB — AEROBIC/ANAEROBIC CULTURE W GRAM STAIN (SURGICAL/DEEP WOUND)
Culture: NO GROWTH
Special Requests: NORMAL

## 2018-08-14 LAB — GLUCOSE, CAPILLARY
GLUCOSE-CAPILLARY: 176 mg/dL — AB (ref 70–99)
GLUCOSE-CAPILLARY: 121 mg/dL — AB (ref 70–99)
Glucose-Capillary: 128 mg/dL — ABNORMAL HIGH (ref 70–99)
Glucose-Capillary: 160 mg/dL — ABNORMAL HIGH (ref 70–99)

## 2018-08-14 MED ORDER — MUSCLE RUB 10-15 % EX CREA
1.0000 "application " | TOPICAL_CREAM | CUTANEOUS | Status: DC | PRN
  Administered 2018-08-14: 1 via TOPICAL
  Filled 2018-08-14: qty 85

## 2018-08-14 MED ORDER — HYDRALAZINE HCL 50 MG PO TABS
100.0000 mg | ORAL_TABLET | Freq: Three times a day (TID) | ORAL | Status: DC
  Administered 2018-08-14 – 2018-08-17 (×10): 100 mg via ORAL
  Filled 2018-08-14 (×10): qty 2

## 2018-08-14 MED ORDER — LABETALOL HCL 5 MG/ML IV SOLN
10.0000 mg | INTRAVENOUS | Status: DC | PRN
  Administered 2018-08-15: 10 mg via INTRAVENOUS
  Filled 2018-08-14 (×2): qty 4

## 2018-08-14 NOTE — Progress Notes (Signed)
PROGRESS NOTE    Beth [name]   RWE:[id number]  DOB: Mar 27, 1946  DOA: 08/06/2018 PCP: Claretta Fraise, MD   Brief Narrative:  Beth [name]  is a 72 y.o. female with past medical history relevant for stage II hypertension degenerative joint disease, history of prior CVA in 2006 and history of chronic anemia who was admitted on 08/06/2018 by the orthopedic team for a knee infection. [facility] were asked to assist with managing HTN.    Subjective: She has no complaints.     Assessment & Plan:   Principal Problem:   Infection of total right knee replacement - abscess of right leg - per primary team  Active Problems:   Essential hypertension, uncontrolled - outpt meds: Lasix 40 mg daily, Amlodipine 10mg , Bystolic40 daily, Olmesartan 40 mg daily, Hydralazine 100 TID - currently on additional Imdur 30 mg daily and Clonidine patch 0.2 mg daily- Hydralazine has been cut back to 50 mg TID- as BP still elevated, will change back to 100 TID    Diabetes type 2, controlled  - SSI- holding Metformin     DVT prophylaxis: Lovenox Code Status: Full code  Antimicrobials:  Anti-infectives (From admission, onward)   Start     Dose/Rate Route Frequency Ordered Stop   08/13/18 1000  doxycycline (VIBRA-TABS) tablet 100 mg     100 mg Oral Every 12 hours 08/12/18 0840     08/13/18 0000  doxycycline (VIBRA-TABS) 100 MG tablet     100 mg Oral Every 12 hours 08/12/18 0849     08/09/18 1200  vancomycin (VANCOCIN) 1,250 mg in sodium chloride 0.9 % 250 mL IVPB  Status:  Discontinued     1,250 mg 166.7 mL/hr over 90 Minutes Intravenous Every 48 hours 08/08/18 1316 08/13/18 0642   08/07/18 2000  vancomycin (VANCOCIN) IVPB 750 mg/150 ml premix  Status:  Discontinued     750 mg 150 mL/hr over 60 Minutes Intravenous Every 24 hours 08/06/18 2021 08/08/18 1316   08/06/18 1830  vancomycin (VANCOCIN) 1,500 mg in sodium chloride 0.9 % 500 mL IVPB     1,500 mg 250 mL/hr over 120 Minutes Intravenous   Once 08/06/18 1811 08/06/18 2306       Objective: Vitals:   08/13/18 2226 08/14/18 0300 08/14/18 0534 08/14/18 0902  BP: (!) 195/106 (!) 188/93 (!) 174/82 (!) 190/80  Pulse: 72 68 71 88  Resp: 16  16   Temp: 98.4 F (36.9 C)  98.3 F (36.8 C)   TempSrc: Oral  Oral   SpO2: 97%  97%   Weight:      Height:        Intake/Output Summary (Last 24 hours) at 08/14/2018 1232 Last data filed at 08/14/2018 0900 Gross per 24 hour  Intake 340 ml  Output 3700 ml  Net -3360 ml   Filed Weights   08/07/18 1100 08/11/18 2300  Weight: 92 kg 98.4 kg    Examination: General exam: Appears comfortable  HEENT: PERRLA, oral mucosa moist, no sclera icterus or thrush Respiratory system: Clear to auscultation. Respiratory effort normal. Cardiovascular system: S1 & S2 heard, RRR.   Gastrointestinal system: Abdomen soft, non-tender, nondistended. Normal bowel sound. No organomegaly Central nervous system: Alert and oriented. No focal neurological deficits. Extremities: No cyanosis, clubbing or edema Skin:   ulcer on right knee noted Psychiatry:  Mood & affect appropriate.     Data Reviewed: I have personally reviewed following labs and imaging studies  CBC: Recent Labs  Lab 08/08/18  6301 08/09/18 0411 08/10/18 0417 08/11/18 0521 08/13/18 0658  WBC 6.4 7.8 11.0* 12.5* 8.1  HGB 10.2* 10.7* 11.7* 10.4* 11.1*  HCT 32.6* 34.0* 37.2 32.8* 34.7*  MCV 81.5 80.4 80.2 79.2 79.6  PLT 335 345 358 351 601   Basic Metabolic Panel: Recent Labs  Lab 08/08/18 0455 08/09/18 0411 08/10/18 0417 08/11/18 0521 08/13/18 0658  NA 139 138 134* 135 138  K 4.1 3.7 3.8 4.0 4.2  CL 102 102 96* 97* 99  CO2 25 28 27 28 28   GLUCOSE 127* 150* 240* 156* 129*  BUN 21 21 20  42* 24*  CREATININE 1.19* 0.96 0.93 1.18* 0.80  CALCIUM 9.0 8.8* 8.9 8.8* 9.0   GFR: Estimated Creatinine Clearance: 71.7 mL/min (by C-G formula based on SCr of 0.8 mg/dL). Liver Function Tests: Recent Labs  Lab 08/08/18 0455  08/09/18 0411 08/10/18 0417 08/11/18 0521  AST 12* 13* 13* 13*  ALT 10 9 10 11   ALKPHOS 88 84 89 76  BILITOT 0.8 0.8 1.3* 0.5  PROT 6.8 7.0 7.6 6.6  ALBUMIN 3.2* 3.3* 3.2* 2.7*   No results for input(s): LIPASE, AMYLASE in the last 168 hours. No results for input(s): AMMONIA in the last 168 hours. Coagulation Profile: No results for input(s): INR, PROTIME in the last 168 hours. Cardiac Enzymes: No results for input(s): CKTOTAL, CKMB, CKMBINDEX, TROPONINI in the last 168 hours. BNP (last 3 results) No results for input(s): PROBNP in the last 8760 hours. HbA1C: No results for input(s): HGBA1C in the last 72 hours. CBG: Recent Labs  Lab 08/13/18 1156 08/13/18 1640 08/13/18 2229 08/14/18 0729 08/14/18 1206  GLUCAP 197* 94 145* 128* 160*   Lipid Profile: No results for input(s): CHOL, HDL, LDLCALC, TRIG, CHOLHDL, LDLDIRECT in the last 72 hours. Thyroid Function Tests: No results for input(s): TSH, T4TOTAL, FREET4, T3FREE, THYROIDAB in the last 72 hours. Anemia Panel: No results for input(s): VITAMINB12, FOLATE, FERRITIN, TIBC, IRON, RETICCTPCT in the last 72 hours. Urine analysis:    Component Value Date/Time   COLORURINE YELLOW 02/11/2017 2258   APPEARANCEUR Clear 04/03/2017 1615   LABSPEC 1.010 02/11/2017 2258   PHURINE 6.0 02/11/2017 2258   GLUCOSEU Negative 04/03/2017 1615   HGBUR LARGE (A) 02/11/2017 2258   BILIRUBINUR Negative 04/03/2017 1615   KETONESUR NEGATIVE 02/11/2017 2258   PROTEINUR 2+ (A) 04/03/2017 1615   PROTEINUR 100 (A) 02/11/2017 2258   UROBILINOGEN 1.0 04/30/2015 1400   NITRITE Negative 04/03/2017 1615   NITRITE NEGATIVE 02/11/2017 2258   LEUKOCYTESUR Negative 04/03/2017 1615   Sepsis Labs: @LABRCNTIP (procalcitonin:4,lacticidven:4) ) Recent Results (from the past 240 hour(s))  Aerobic/Anaerobic Culture (surgical/deep wound)     Status: None (Preliminary result)   Collection Time: 08/09/18  8:02 PM  Result Value Ref Range Status   Specimen  Description   Final    SYNOVIAL WRIST RIGHT Performed at [facility], [place] [address], [place], [place] [address]    Special Requests   Final    Normal Performed at [facility], [place] [address], [place], [place] [address]    Gram Stain   Final    ABUNDANT WBC PRESENT,BOTH PMN AND MONONUCLEAR NO ORGANISMS SEEN    Culture   Final    NO GROWTH 4 DAYS NO ANAEROBES ISOLATED; CULTURE IN PROGRESS FOR 5 DAYS Performed at [facility], [place] [address] [place], [place] [address]    Report Status PENDING  Incomplete         Radiology Studies:  No results found.    Scheduled Meds: . amLODipine  10 mg Oral Daily  . atorvastatin  20 mg Oral QPM  . cloNIDine  0.2 mg Transdermal Weekly  . docusate sodium  100 mg Oral BID  . doxycycline  100 mg Oral Q12H  . enoxaparin (LOVENOX) injection  40 mg Subcutaneous Q24H  . furosemide  40 mg Oral q morning - 10a  . hydrALAZINE  50 mg Oral TID  . insulin aspart  0-15 Units Subcutaneous TID WC  . insulin aspart  0-5 Units Subcutaneous QHS  . irbesartan  300 mg Oral Daily  . isosorbide mononitrate  30 mg Oral Daily  . multivitamin with minerals  1 tablet Oral Q48H  . nebivolol  20 mg Oral Daily  . pantoprazole  40 mg Oral Daily  . rOPINIRole  1 mg Oral QHS  . sodium chloride flush  3 mL Intravenous Q12H  . vitamin B-12  100 mcg Oral Daily   Continuous Infusions: . sodium chloride       LOS: 8 days    Time spent in minutes: 35    Debbe Odea, MD [facility] Pager: [email or web address] Password [email or web address] 08/14/2018, 12:32 PM

## 2018-08-14 NOTE — Progress Notes (Signed)
     Subjective: Hospital day - 8 Active Problems:   Abscess of right leg   Infection of total right knee replacement ([place])  Patient reports pain as moderate, controlled. Continues to have increase BP, but medicine is on board and helping to treat/control. Patient states that they are trying another medication to see if this will help her BP.   Objective:   VITALS:   Vitals:   08/14/18 0300 08/14/18 0534  BP: (!) 188/93 (!) 174/82  Pulse: 68 71  Resp:  16  Temp:  98.3 F (36.8 C)  SpO2:  97%   EXAM General - Patient is Alert and Oriented Extremity - Neurologically intact Neurovascular intact Sensation intact distally Dorsiflexion/Plantar flexion intact Motor Function - intact, moving foot and toes well on exam.    LABS Recent Labs    08/13/18 0658  HGB 11.1*  HCT 34.7*  WBC 8.1  PLT 377    Recent Labs    08/13/18 0658  NA 138  K 4.2  BUN 24*  CREATININE 0.80  GLUCOSE 129*     Assessment/Plan: Hospital day - 8 Active Problems:   Abscess of right leg   Infection of total right knee replacement ([facility])  Pt will require discharge to SNF for 24/7 assistance with mobility and ambulation.   BP is still high and medicine is working with the patient. Appreciate medicine helping with controlling the patient's HTN Continue with antibiotic regimen    [place] Pugh. Abriella Filkins   PAC  08/14/2018, 8:41 AM

## 2018-08-14 NOTE — Clinical Social Work Note (Signed)
Clinical Social Work Assessment  Patient Details  Name: Beth [name] MRN: [id number] Date of Birth: 12/26/1945  Date of referral:  08/13/18               Reason for consult:  Facility Placement                Permission sought to share information with:  Facility Sport and exercise psychologist Permission granted to share information::  Yes, Verbal Permission Granted  Name::     Arneisha Kincannon  Agency::  SNF  Relationship::  son  Contact Information:  [phone]  Housing/Transportation Living arrangements for the past 2 months:  Single Family Home Source of Information:  Patient Patient Interpreter Needed:  None Criminal Activity/Legal Involvement Pertinent to Current Situation/Hospitalization:  No - Comment as needed Significant Relationships:  Adult Children, Other Family Members Lives with:  Other (Comment)(Granddaughter) Do you feel safe going back to the place where you live?  Yes Need for family participation in patient care:  No (Coment)  Care giving concerns:  Patient currently lives with granddaughter who works third shift and is unable to help patient during the day because she is sleeping. Patient has family nearby but is generally alone most of the day/night.   Social Worker assessment / plan:  CSW met with patient to discuss discharge plan and SNF referral process. Patient lives at home with granddaughter. Granddaughter works third shift and sleeps during the day so she will not be able to help patient much. Patient uses a walker at baseline to get around her home. She has supportive family and multiple children who live nearby [place], [place]).   Patient prefers SNF near her home in [place] and mentioned [facility] as it is close to her home. Patient does not wish to go to [facility].  CSW explained [facility] insurance auth process to patient who reports understanding. CSW will complete FL2, send out referrals, and continue to follow for d/c coordination.  Employment  status:  Retired Nurse, adult PT Recommendations:  [place] / Referral to community resources:  [place]  Patient/Family's Response to care:  Patient is willing to go to SNF for therapy and recognizes she will need to get stronger before returning home.   Patient/Family's Understanding of and Emotional Response to Diagnosis, Current Treatment, and Prognosis:  Patient is focused on getting medically stable for d/c. She prefers SNF near home and understands process.  Emotional Assessment Appearance:  Appears stated age Attitude/Demeanor/Rapport:  Engaged Affect (typically observed):  Accepting, Appropriate Orientation:  Oriented to Self, Oriented to Place, Oriented to  Time, Oriented to Situation Alcohol / Substance use:  Not Applicable Psych involvement (Current and /or in the community):  No (Comment)  Discharge Needs  Concerns to be addressed:  Care Coordination Readmission within the last 30 days:  No Current discharge risk:  Physical Impairment Barriers to Discharge:  Ship broker, Continued Medical Work up   [employer], [place] 08/14/2018, 11:21 AM

## 2018-08-14 NOTE — NC FL2 (Addendum)
[place] LEVEL OF CARE SCREENING TOOL     IDENTIFICATION  Patient Name: Beth [name] Birthdate: 1945-12-26 Sex: female Admission Date (Current Location): 08/06/2018  [facility] and [place] Number:  Engineer, manufacturing systems and Address:  [facility],  [place] [address], [place]      Provider Number: [id number]  Attending Physician Name and Address:  [name], MD  Relative Name and Phone Number:  Margurite Duffy: [phone]    Current Level of Care: Hospital Recommended Level of Care: [place] Prior Approval Number:    Date Approved/Denied:   PASRR Number: [id number]  Discharge Plan: SNF    Current Diagnoses: Patient Active Problem List   Diagnosis Date Noted  . Infection of total right knee replacement ([place]) 08/06/2018  . Cellulitis and abscess of leg 01/14/2017  . Abscess of right leg 01/14/2017  . Diabetes type 2, controlled ([address]) 03/07/2016  . Gout   . Essential hypertension   . Enuresis 11/19/2015  . HLD (hyperlipidemia) 01/04/2014  . Anemia, iron deficiency 12/30/2011  . Constipation 04/04/2009  . TUBULOVILLOUS ADENOMA, COLON 04/03/2009    Orientation RESPIRATION BLADDER Height & Weight     Self, Time, Situation, Place  Normal External catheter Weight: 217 lb (98.4 kg) Height:  5\' 5"  (165.1 cm)  BEHAVIORAL SYMPTOMS/MOOD NEUROLOGICAL BOWEL NUTRITION STATUS      Continent Diet(Regular)  AMBULATORY STATUS COMMUNICATION OF NEEDS Skin   Extensive Assist Verbally (R knee abscess)                       Personal Care Assistance Level of Assistance  Bathing, Feeding, Dressing Bathing Assistance: Maximum assistance Feeding assistance: Limited assistance Dressing Assistance: Maximum assistance     Functional Limitations Info  Sight, Hearing, Speech Sight Info: Adequate Hearing Info: Adequate Speech Info: Adequate    SPECIAL CARE FACTORS FREQUENCY  PT (By licensed PT), OT (By licensed OT)     PT  Frequency: 5x/week OT Frequency: 5x/week            Contractures Contractures Info: Not present    Additional Factors Info  Allergies, Code Status Code Status Info: Full Allergies Info: ALEVE NAPROXEN SODIUM, ASA ASPIRIN, CLONIDINE DERIVATIVES, SHELLFISH ALLERGY            Current Medications (08/14/2018):  This is the current hospital active medication list Current Facility-Administered Medications  Medication Dose Route Frequency Provider Last Rate Last Dose  . 0.9 %  sodium chloride infusion  250 mL Intravenous PRN Cecilio Asper, [employer], PA-C      . acetaminophen (TYLENOL) tablet 650 mg  650 mg Oral Q6H PRN Ardeen Jourdain, PA-C   650 mg at 08/12/18 2047   Or  . acetaminophen (TYLENOL) suppository 650 mg  650 mg Rectal Q6H PRN Cecilio Asper, Amber, PA-C      . amLODipine (NORVASC) tablet 10 mg  10 mg Oral Daily Constable, Amber, PA-C   10 mg at 08/14/18 1009  . atorvastatin (LIPITOR) tablet 20 mg  20 mg Oral QPM Constable, Amber, PA-C   20 mg at 08/13/18 1646  . bisacodyl (DULCOLAX) suppository 10 mg  10 mg Rectal Daily PRN Cecilio Asper, Amber, PA-C      . cloNIDine (CATAPRES - Dosed in mg/24 hr) patch 0.2 mg  0.2 mg Transdermal Weekly Emokpae, Courage, MD   0.2 mg at 08/13/18 1801  . docusate sodium (COLACE) capsule 100 mg  100 mg Oral BID Constable, Amber, PA-C   100 mg at  08/14/18 1009  . doxycycline (VIBRA-TABS) tablet 100 mg  100 mg Oral Q12H Edmisten, Kristie L, PA   100 mg at 08/14/18 1009  . enoxaparin (LOVENOX) injection 40 mg  40 mg Subcutaneous Q24H Wylene Simmer, MD   40 mg at 08/14/18 1007  . furosemide (LASIX) tablet 40 mg  40 mg Oral q morning - 10a Constable, Amber, PA-C   40 mg at 08/14/18 1009  . hydrALAZINE (APRESOLINE) tablet 100 mg  100 mg Oral TID Debbe Odea, MD   100 mg at 08/14/18 1514  . insulin aspart (novoLOG) injection 0-15 Units  0-15 Units Subcutaneous TID WC Constable, Amber, PA-C   2 Units at 08/14/18 1350  . insulin aspart (novoLOG) injection 0-5 Units   0-5 Units Subcutaneous QHS Ardeen Jourdain, PA-C   2 Units at 08/06/18 2311  . irbesartan (AVAPRO) tablet 300 mg  300 mg Oral Daily Constable, Amber, PA-C   300 mg at 08/14/18 1009  . isosorbide mononitrate (IMDUR) 24 hr tablet 30 mg  30 mg Oral Daily Emokpae, Courage, MD   30 mg at 08/14/18 1009  . labetalol (NORMODYNE,TRANDATE) injection 10-20 mg  10-20 mg Intravenous Q2H PRN Debbe Odea, MD      . metoCLOPramide (REGLAN) tablet 10 mg  10 mg Oral Q6H PRN Shuford, Tracy, PA-C      . multivitamin with minerals tablet 1 tablet  1 tablet Oral Q48H Green, Terri L, RPH   1 tablet at 08/13/18 1246  . nebivolol (BYSTOLIC) tablet 20 mg  20 mg Oral Daily Constable, Amber, PA-C   20 mg at 08/14/18 1009  . ondansetron (ZOFRAN) tablet 4 mg  4 mg Oral Q6H PRN Cecilio Asper, Amber, PA-C       Or  . ondansetron (ZOFRAN) injection 4 mg  4 mg Intravenous Q6H PRN Cecilio Asper, Amber, PA-C   4 mg at 08/09/18 1122  . oxyCODONE (Oxy IR/ROXICODONE) immediate release tablet 5-10 mg  5-10 mg Oral Q6H PRN Cecilio Asper, Amber, PA-C   5 mg at 08/14/18 1406  . pantoprazole (PROTONIX) EC tablet 40 mg  40 mg Oral Daily Shuford, Tracy, PA-C   40 mg at 08/14/18 1009  . polyethylene glycol (MIRALAX / GLYCOLAX) packet 17 g  17 g Oral Daily PRN Cecilio Asper, Amber, PA-C   17 g at 08/12/18 1048  . rOPINIRole (REQUIP) tablet 1 mg  1 mg Oral QHS Constable, Amber, PA-C   1 mg at 08/13/18 2240  . sodium chloride flush (NS) 0.9 % injection 3 mL  3 mL Intravenous Q12H Constable, Amber, PA-C   3 mL at 08/14/18 1010  . sodium chloride flush (NS) 0.9 % injection 3 mL  3 mL Intravenous PRN Cecilio Asper, Amber, PA-C      . sodium phosphate (FLEET) 7-19 GM/118ML enema 1 enema  1 enema Rectal Once PRN Cecilio Asper, Amber, PA-C      . traMADol (ULTRAM) tablet 50-100 mg  50-100 mg Oral Q6H PRN Cecilio Asper, Amber, PA-C   50 mg at 08/14/18 1514  . vitamin B-12 (CYANOCOBALAMIN) tablet 100 mcg  100 mcg Oral Daily Ardeen Jourdain, PA-C   100 mcg at 08/14/18 1009    Facility-Administered Medications Ordered in Other Encounters  Medication Dose Route Frequency Provider Last Rate Last Dose  . tranexamic acid (CYKLOKAPRON) 2,000 mg in sodium chloride 0.9 % 50 mL Topical Application  5,284 mg Topical Once [name], MD         Discharge Medications: Please see discharge summary for a list of discharge medications.  Relevant Imaging Results:  Relevant Lab Results:   Additional Information SSN: [id number]  Pricilla Holm, [place]

## 2018-08-15 DIAGNOSIS — M25531 Pain in right wrist: Secondary | ICD-10-CM

## 2018-08-15 DIAGNOSIS — M109 Gout, unspecified: Secondary | ICD-10-CM

## 2018-08-15 LAB — GLUCOSE, CAPILLARY
GLUCOSE-CAPILLARY: 271 mg/dL — AB (ref 70–99)
GLUCOSE-CAPILLARY: 412 mg/dL — AB (ref 70–99)
GLUCOSE-CAPILLARY: 398 mg/dL — AB (ref 70–99)
GLUCOSE-CAPILLARY: 214 mg/dL — AB (ref 70–99)
Glucose-Capillary: 176 mg/dL — ABNORMAL HIGH (ref 70–99)

## 2018-08-15 LAB — CBC
HCT: 30.9 % — ABNORMAL LOW (ref 36.0–46.0)
Hemoglobin: 9.8 g/dL — ABNORMAL LOW (ref 12.0–15.0)
MCH: 24.9 pg — ABNORMAL LOW (ref 26.0–34.0)
MCHC: 31.7 g/dL (ref 30.0–36.0)
MCV: 78.4 fL (ref 78.0–100.0)
PLATELETS: 367 10*3/uL (ref 150–400)
RBC: 3.94 MIL/uL (ref 3.87–5.11)
RDW: 14.6 % (ref 11.5–15.5)
WBC: 10.4 10*3/uL (ref 4.0–10.5)

## 2018-08-15 LAB — BASIC METABOLIC PANEL
Anion gap: 11 (ref 5–15)
BUN: 17 mg/dL (ref 8–23)
CHLORIDE: 95 mmol/L — AB (ref 98–111)
CO2: 24 mmol/L (ref 22–32)
CREATININE: 0.75 mg/dL (ref 0.44–1.00)
Calcium: 8.5 mg/dL — ABNORMAL LOW (ref 8.9–10.3)
Glucose, Bld: 251 mg/dL — ABNORMAL HIGH (ref 70–99)
Potassium: 3.7 mmol/L (ref 3.5–5.1)
SODIUM: 130 mmol/L — AB (ref 135–145)

## 2018-08-15 MED ORDER — PREDNISONE 5 MG (21) PO TBPK
10.0000 mg | ORAL_TABLET | Freq: Every morning | ORAL | Status: AC
  Administered 2018-08-15: 10 mg via ORAL
  Filled 2018-08-15: qty 21

## 2018-08-15 MED ORDER — PREDNISONE 5 MG (21) PO TBPK
5.0000 mg | ORAL_TABLET | Freq: Three times a day (TID) | ORAL | Status: AC
  Administered 2018-08-16 (×3): 5 mg via ORAL

## 2018-08-15 MED ORDER — PREDNISONE 5 MG (21) PO TBPK
10.0000 mg | ORAL_TABLET | Freq: Every evening | ORAL | Status: AC
  Administered 2018-08-15: 10 mg via ORAL

## 2018-08-15 MED ORDER — PREDNISONE 5 MG (21) PO TBPK
5.0000 mg | ORAL_TABLET | ORAL | Status: AC
  Administered 2018-08-15: 5 mg via ORAL

## 2018-08-15 MED ORDER — LABETALOL HCL 200 MG PO TABS
200.0000 mg | ORAL_TABLET | Freq: Two times a day (BID) | ORAL | Status: DC
  Administered 2018-08-15 – 2018-08-17 (×5): 200 mg via ORAL
  Filled 2018-08-15 (×4): qty 1
  Filled 2018-08-15: qty 2

## 2018-08-15 MED ORDER — PREDNISONE 5 MG (21) PO TBPK
10.0000 mg | ORAL_TABLET | Freq: Every evening | ORAL | Status: AC
  Administered 2018-08-16: 10 mg via ORAL

## 2018-08-15 MED ORDER — PREDNISONE 5 MG (21) PO TBPK
5.0000 mg | ORAL_TABLET | Freq: Four times a day (QID) | ORAL | Status: DC
  Administered 2018-08-17 (×2): 5 mg via ORAL

## 2018-08-15 NOTE — Progress Notes (Signed)
Patient transferred to [place]; assuming care of patient. Patient currently alert and oriented; No acute distress; Patient oriented to unit and room.

## 2018-08-15 NOTE — Progress Notes (Signed)
PROGRESS NOTE    [name]   KGM:[id number]  DOB: 12/23/1945  DOA: 08/06/2018 PCP: Claretta Fraise, MD   Brief Narrative:  Beth [name]  is a 72 y.o. female with past medical history relevant for stage II hypertension degenerative joint disease, history of prior CVA in 2006 and history of chronic anemia who was admitted on 08/06/2018 by the orthopedic team for a knee infection. [facility] were asked to assist with managing HTN.    Subjective: Having pain in left elbow and right hand which she believes is a gout flare. No other complaitns.     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 - steroids started by ortho today- control pain    Essential hypertension, uncontrolled - outpt meds: Lasix 40 mg daily, Amlodipine 10mg , Bystolic40 daily, Olmesartan 40 mg daily, Hydralazine 100 TID - 9/7 >> currently on additional Imdur 30 mg daily and Clonidine patch 0.2 mg daily- Hydralazine has been cut back to 50 mg TID- as BP still elevated, will change back to 100 TID - 9/8- change Bystolic to Labetalol- control pain which appears to be the driving factor for HTN today    Diabetes type 2, controlled  - SSI- holding Metformin     DVT prophylaxis: Lovenox Code Status: Full code  Antimicrobials:  Anti-infectives (From admission, onward)   Start     Dose/Rate Route Frequency Ordered Stop   08/13/18 1000  doxycycline (VIBRA-TABS) tablet 100 mg     100 mg Oral Every 12 hours 08/12/18 0840     08/13/18 0000  doxycycline (VIBRA-TABS) 100 MG tablet     100 mg Oral Every 12 hours 08/12/18 0849     08/09/18 1200  vancomycin (VANCOCIN) 1,250 mg in sodium chloride 0.9 % 250 mL IVPB  Status:  Discontinued     1,250 mg 166.7 mL/hr over 90 Minutes Intravenous Every 48 hours 08/08/18 1316 08/13/18 0642   08/07/18 2000  vancomycin (VANCOCIN) IVPB 750  mg/150 ml premix  Status:  Discontinued     750 mg 150 mL/hr over 60 Minutes Intravenous Every 24 hours 08/06/18 2021 08/08/18 1316   08/06/18 1830  vancomycin (VANCOCIN) 1,500 mg in sodium chloride 0.9 % 500 mL IVPB     1,500 mg 250 mL/hr over 120 Minutes Intravenous  Once 08/06/18 1811 08/06/18 2306       Objective: Vitals:   08/15/18 0606 08/15/18 0630 08/15/18 0731 08/15/18 0755  BP: (!) 175/93 (!) 186/92 (!) 195/94 (!) 182/92  Pulse: 80 82  80  Resp:  18    Temp:  99.2 F (37.3 C)    TempSrc:  Oral    SpO2:  96% 98%   Weight:      Height:        Intake/Output Summary (Last 24 hours) at 08/15/2018 1208 Last data filed at 08/15/2018 0600 Gross per 24 hour  Intake 740 ml  Output 1250 ml  Net -510 ml   Filed Weights   08/07/18 1100 08/11/18 2300  Weight: 92 kg 98.4 kg    Examination: General exam: Appears comfortable  HEENT: PERRLA, oral mucosa moist, no sclera icterus or thrush Respiratory system: Clear to auscultation. Respiratory effort normal. Cardiovascular system: S1 & S2 heard, RRR.   Gastrointestinal system: Abdomen soft, non-tender, nondistended. Normal bowel sound. No organomegaly Central nervous system: Alert  and oriented. No focal neurological deficits. Extremities- left elbow ROM decreased- right hand/ wrist edema and tenderness noted Skin:   ulcer on right knee noted Psychiatry:  Mood & affect appropriate.     Data Reviewed: I have personally reviewed following labs and imaging studies  CBC: Recent Labs  Lab 08/09/18 0411 08/10/18 0417 08/11/18 0521 08/13/18 0658 08/15/18 1028  WBC 7.8 11.0* 12.5* 8.1 10.4  HGB 10.7* 11.7* 10.4* 11.1* 9.8*  HCT 34.0* 37.2 32.8* 34.7* 30.9*  MCV 80.4 80.2 79.2 79.6 78.4  PLT 345 358 351 377 502   Basic Metabolic Panel: Recent Labs  Lab 08/09/18 0411 08/10/18 0417 08/11/18 0521 08/13/18 0658 08/15/18 1028  NA 138 134* 135 138 130*  K 3.7 3.8 4.0 4.2 3.7  CL 102 96* 97* 99 95*  CO2 28 27 28 28 24     GLUCOSE 150* 240* 156* 129* 251*  BUN 21 20 42* 24* 17  CREATININE 0.96 0.93 1.18* 0.80 0.75  CALCIUM 8.8* 8.9 8.8* 9.0 8.5*   GFR: Estimated Creatinine Clearance: 71.7 mL/min (by C-G formula based on SCr of 0.75 mg/dL). Liver Function Tests: Recent Labs  Lab 08/09/18 0411 08/10/18 0417 08/11/18 0521  AST 13* 13* 13*  ALT 9 10 11   ALKPHOS 84 89 76  BILITOT 0.8 1.3* 0.5  PROT 7.0 7.6 6.6  ALBUMIN 3.3* 3.2* 2.7*   No results for input(s): LIPASE, AMYLASE in the last 168 hours. No results for input(s): AMMONIA in the last 168 hours. Coagulation Profile: No results for input(s): INR, PROTIME in the last 168 hours. Cardiac Enzymes: No results for input(s): CKTOTAL, CKMB, CKMBINDEX, TROPONINI in the last 168 hours. BNP (last 3 results) No results for input(s): PROBNP in the last 8760 hours. HbA1C: No results for input(s): HGBA1C in the last 72 hours. CBG: Recent Labs  Lab 08/14/18 1206 08/14/18 1653 08/14/18 2211 08/15/18 0729 08/15/18 1153  GLUCAP 160* 176* 121* 176* 271*   Lipid Profile: No results for input(s): CHOL, HDL, LDLCALC, TRIG, CHOLHDL, LDLDIRECT in the last 72 hours. Thyroid Function Tests: No results for input(s): TSH, T4TOTAL, FREET4, T3FREE, THYROIDAB in the last 72 hours. Anemia Panel: No results for input(s): VITAMINB12, FOLATE, FERRITIN, TIBC, IRON, RETICCTPCT in the last 72 hours. Urine analysis:    Component Value Date/Time   COLORURINE YELLOW 02/11/2017 2258   APPEARANCEUR Clear 04/03/2017 1615   LABSPEC 1.010 02/11/2017 2258   PHURINE 6.0 02/11/2017 2258   GLUCOSEU Negative 04/03/2017 1615   HGBUR LARGE (A) 02/11/2017 2258   BILIRUBINUR Negative 04/03/2017 1615   KETONESUR NEGATIVE 02/11/2017 2258   PROTEINUR 2+ (A) 04/03/2017 1615   PROTEINUR 100 (A) 02/11/2017 2258   UROBILINOGEN 1.0 04/30/2015 1400   NITRITE Negative 04/03/2017 1615   NITRITE NEGATIVE 02/11/2017 2258   LEUKOCYTESUR Negative 04/03/2017 1615   Sepsis  Labs: @LABRCNTIP (procalcitonin:4,lacticidven:4) ) Recent Results (from the past 240 hour(s))  Aerobic/Anaerobic Culture (surgical/deep wound)     Status: None   Collection Time: 08/09/18  8:02 PM  Result Value Ref Range Status   Specimen Description   Final    SYNOVIAL WRIST RIGHT Performed at [facility], [place] [address], [place], [place] [address]    Special Requests   Final    Normal Performed at [facility], [place] [address] [place], [place] [address]    Gram Stain   Final    ABUNDANT WBC PRESENT,BOTH PMN AND MONONUCLEAR NO ORGANISMS SEEN    Culture   Final  No growth aerobically or anaerobically. Performed at [facility], [place] [address], [place], [place] [address]    Report Status 08/14/2018 FINAL  Final         Radiology Studies: No results found.    Scheduled Meds: . amLODipine  10 mg Oral Daily  . atorvastatin  20 mg Oral QPM  . cloNIDine  0.2 mg Transdermal Weekly  . docusate sodium  100 mg Oral BID  . doxycycline  100 mg Oral Q12H  . enoxaparin (LOVENOX) injection  40 mg Subcutaneous Q24H  . furosemide  40 mg Oral q morning - 10a  . hydrALAZINE  100 mg Oral TID  . insulin aspart  0-15 Units Subcutaneous TID WC  . insulin aspart  0-5 Units Subcutaneous QHS  . irbesartan  300 mg Oral Daily  . isosorbide mononitrate  30 mg Oral Daily  . labetalol  200 mg Oral BID  . multivitamin with minerals  1 tablet Oral Q48H  . pantoprazole  40 mg Oral Daily  . predniSONE  10 mg Oral Nightly  . [START ON 08/16/2018] predniSONE  10 mg Oral Nightly  . predniSONE  5 mg Oral PC lunch  . predniSONE  5 mg Oral PC supper  . [START ON 08/16/2018] predniSONE  5 mg Oral 3 x daily with food  . [START ON 08/17/2018] predniSONE  5 mg Oral 4X daily taper  . rOPINIRole  1 mg Oral QHS  . sodium chloride flush  3 mL Intravenous Q12H  . vitamin B-12  100 mcg Oral Daily   Continuous Infusions: . sodium chloride       LOS: 9 days     Time spent in minutes: 35    Debbe Odea, MD [facility] Pager: [email or web address] Password [email or web address] 08/15/2018, 12:08 PM

## 2018-08-15 NOTE — Progress Notes (Addendum)
Subjective:     Patient reports pain as moderate.  Patient has PMH significant for gout and DM.  Reports that she is experiencing gout pain in her R knee.  This is the infected knee for which she is being treated.  Continues to struggle with HTN.  Tolerating POs well.  Denies fever, chills, N/V, CP, SOB.  Objective:   VITALS:  Temp:  [98.4 F (36.9 C)-99.2 F (37.3 C)] 99.2 F (37.3 C) (09/08 0630) Pulse Rate:  [70-89] 80 (09/08 0755) Resp:  [14-19] 18 (09/08 0630) BP: (149-215)/(78-103) 182/92 (09/08 0755) SpO2:  [93 %-98 %] 98 % (09/08 0731)  General: WDWN patient in NAD. Psych:  Appropriate mood and affect. Neuro:  A&O x 3, Moving all extremities, sensation intact to light touch HEENT:  EOMs intact Chest:  Even non-labored respirations Skin:  Dressing C/D/I, no rashes or lesions Extremities: warm/dry, moderate edema R knee, no erythema or echymosis.  No lymphadenopathy. Pulses: Femoral 2+ MSK:  ROM: lacks 5 degrees of TKE, MMT: able to perform quad set, (-) Homan's    LABS Recent Labs    08/13/18 0658  HGB 11.1*  WBC 8.1  PLT 377   Recent Labs    08/13/18 0658  NA 138  K 4.2  CL 99  CO2 28  BUN 24*  CREATININE 0.80  GLUCOSE 129*   No results for input(s): LABPT, INR in the last 72 hours.   Assessment/Plan:     Patient seen in rounds for Dr. Wynelle Link Patient currently attributes her R knee pain to a gouty flare up.  Patient does not tolerated NSAIDs.  She is not sure what her PCP prescribes when she experiences an acute gouty flare up.  Add pred dose pak to see if this helps with pain and potentially BP.  Continue to monitor blood sugars. D/C to SNF when bed available and BP under control Appreciate Medicine team's assistance with patient. Continue ABX Plan for outpatient post-op visit with Dr. Wynelle Link.  [name] PA-C [facility] Office:  [phone]

## 2018-08-16 DIAGNOSIS — T380X5A Adverse effect of glucocorticoids and synthetic analogues, initial encounter: Secondary | ICD-10-CM

## 2018-08-16 DIAGNOSIS — E1165 Type 2 diabetes mellitus with hyperglycemia: Secondary | ICD-10-CM

## 2018-08-16 DIAGNOSIS — R739 Hyperglycemia, unspecified: Secondary | ICD-10-CM

## 2018-08-16 LAB — GLUCOSE, CAPILLARY
GLUCOSE-CAPILLARY: 138 mg/dL — AB (ref 70–99)
GLUCOSE-CAPILLARY: 229 mg/dL — AB (ref 70–99)
GLUCOSE-CAPILLARY: 148 mg/dL — AB (ref 70–99)
Glucose-Capillary: 237 mg/dL — ABNORMAL HIGH (ref 70–99)

## 2018-08-16 MED ORDER — INSULIN GLARGINE 100 UNIT/ML ~~LOC~~ SOLN
10.0000 [IU] | Freq: Every day | SUBCUTANEOUS | Status: DC
  Administered 2018-08-16 – 2018-08-17 (×2): 10 [IU] via SUBCUTANEOUS
  Filled 2018-08-16 (×2): qty 0.1

## 2018-08-16 NOTE — Progress Notes (Signed)
Physical Therapy Treatment Patient Details Name: Beth [name] MRN: [id number] DOB: [birth year] Today's Date: 08/16/2018    History of Present Illness Pt is a 72 YO female admitted for abscess on RLE associated with complications due to former TKR. Pt's R TKR complications include R TKR in 2015, I&D and polyexchange 2015, I&D and polyexchange 2016, reimplantation 2016, and abscess 2018. Pt with PMH of anemia, OA, cataracts, CKD III, CVA 2006, DM II, heart murmur, HLD, HTN. Surgical history includes carpal tunnel release and previously listed R knee surgeries.    PT Comments    Assisted OOB to [facility] + 2 assist then amb a few steps with walker.  Pt progressing slowly with her mobility and will need St Rehab at SNF prior to returning home with grand.   Follow Up Recommendations  SNF;Supervision for mobility/OOB     Equipment Recommendations  None recommended by PT    Recommendations for Other Services       Precautions / Restrictions Precautions Precautions: Fall Restrictions Weight Bearing Restrictions: No    Mobility  Bed Mobility Overal bed mobility: Needs Assistance Bed Mobility: Supine to Sit     Supine to sit: Mod assist;Max assist     General bed mobility comments: assist upper body and complete scooting to EOB  Transfers Overall transfer level: Needs assistance Equipment used: Rolling walker (2 wheeled) Transfers: Sit to/from Stand Sit to Stand: Max assist;+2 physical assistance;+2 safety/equipment Stand pivot transfers: Max assist;+2 physical assistance;+2 safety/equipment       General transfer comment: required + 2 assist for safety.  Assisted from elevated bed to [facility] then odd BSC to wttempt gait.  Ambulation/Gait Ambulation/Gait assistance: Mod assist;Max assist;+2 physical assistance;+2 safety/equipment Gait Distance (Feet): 2 Feet Assistive device: Rolling walker (2 wheeled) Gait Pattern/deviations: Step-to pattern;Decreased step length - right;Decreased  step length - left;Decreased stance time - left Gait velocity: very limited self WB L LE due to pain medial knee with 1/2 swing phase and heavy lean on walker.  Recliner closely behind.     General Gait Details: decreased   Stairs             Wheelchair Mobility    Modified Rankin (Stroke Patients Only)       Balance                                            Cognition Arousal/Alertness: Awake/alert Behavior During Therapy: WFL for tasks assessed/performed Overall Cognitive Status: Within Functional Limits for tasks assessed                                        Exercises      General Comments        Pertinent Vitals/Pain Pain Assessment: 0-10 Pain Location: R knee  Pain Descriptors / Indicators: Aching Pain Intervention(s): Monitored during session;Repositioned    Home Living                      Prior Function            PT Goals (current goals can now be found in the care plan section) Progress towards PT goals: Progressing toward goals    Frequency    Min 2X/week      PT Plan Current plan remains  appropriate    Co-evaluation              AM-PAC PT "6 Clicks" Daily Activity  Outcome Measure  Difficulty turning over in bed (including adjusting bedclothes, sheets and blankets)?: A Lot Difficulty moving from lying on back to sitting on the side of the bed? : A Lot Difficulty sitting down on and standing up from a chair with arms (e.g., wheelchair, bedside commode, etc,.)?: A Lot Help needed moving to and from a bed to chair (including a wheelchair)?: A Lot Help needed walking in hospital room?: A Lot Help needed climbing 3-5 steps with a railing? : Total 6 Click Score: 11    End of Session Equipment Utilized During Treatment: Gait belt Activity Tolerance: Patient limited by fatigue Patient left: in chair;with call bell/phone within reach Nurse Communication: Mobility status PT Visit  Diagnosis: Unsteadiness on feet (R26.81);Muscle weakness (generalized) (M62.81)     Time: 9753-0051 PT Time Calculation (min) (ACUTE ONLY): 27 min  Charges:  $Gait Training: 8-22 mins $Therapeutic Activity: 8-22 mins                     Rica Koyanagi  PTA [facility]  Acute  Rehab Pager      [phone]

## 2018-08-16 NOTE — Progress Notes (Signed)
Patient will be discharging to [facility] when medically stable.  [facility] authorization has been initiated at [facility].  CSW will continue to follow for discharge coordination and update staff when Josem Kaufmann has been received.    Pricilla Holm, MSW, [place] Social Work [phone]

## 2018-08-16 NOTE — Care Management Important Message (Signed)
Important Message  Patient Details  Name: Beth [name] MRN: [id number] Date of Birth: 1946/09/17   Medicare Important Message Given:  Yes    Kerin Salen 08/16/2018, 11:32 AMImportant Message  Patient Details  Name: Beth [name] MRN: [id number] Date of Birth: 04-08-1946   Medicare Important Message Given:  Yes    Kerin Salen 08/16/2018, 11:31 AM

## 2018-08-16 NOTE — Progress Notes (Signed)
PROGRESS NOTE    Beth [name]   WIO:[id number]  DOB: [birth year]  DOA: 08/06/2018 PCP: Claretta Fraise, MD   Brief Narrative:  Beth [name]  is a 72 y.o. female with past medical history relevant for stage II hypertension degenerative joint disease, history of prior CVA in 2006 and history of chronic anemia who was admitted on 08/06/2018 by the orthopedic team for a knee infection. [facility] were asked to assist with managing HTN.    Subjective: Pain in left elbow and right hand is improving but has not resolved completely. Still having trouble moving at left elbow joint due to pain.      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 - steroids started by ortho- control pain    Essential hypertension, uncontrolled - outpt meds: Lasix 40 mg daily, Amlodipine 10mg , Bystolic40 daily, Olmesartan 40 mg daily, Hydralazine 100 TID - 9/7 >> currently on additional Imdur 30 mg daily and Clonidine patch 0.2 mg daily- Hydralazine has been cut back to 50 mg TID- as BP still elevated, will change back to 100 TID - 9/8- change Bystolic to Labetalol- control pain which appears to be the driving factor for HTN today - BP has improved significantly - follow without changing medications for now    Diabetes type 2, controlled   - holding Metformin- gugars elevated due to steroids- will give a dose of Lantus today - reassess tomorrow     DVT prophylaxis: Lovenox Code Status: Full code  Antimicrobials:  Anti-infectives (From admission, onward)   Start     Dose/Rate Route Frequency Ordered Stop   08/13/18 1000  doxycycline (VIBRA-TABS) tablet 100 mg     100 mg Oral Every 12 hours 08/12/18 0840     08/13/18 0000  doxycycline (VIBRA-TABS) 100 MG tablet     100 mg Oral Every 12 hours 08/12/18 0849     08/09/18 1200  vancomycin (VANCOCIN) 1,250 mg in  sodium chloride 0.9 % 250 mL IVPB  Status:  Discontinued     1,250 mg 166.7 mL/hr over 90 Minutes Intravenous Every 48 hours 08/08/18 1316 08/13/18 0642   08/07/18 2000  vancomycin (VANCOCIN) IVPB 750 mg/150 ml premix  Status:  Discontinued     750 mg 150 mL/hr over 60 Minutes Intravenous Every 24 hours 08/06/18 2021 08/08/18 1316   08/06/18 1830  vancomycin (VANCOCIN) 1,500 mg in sodium chloride 0.9 % 500 mL IVPB     1,500 mg 250 mL/hr over 120 Minutes Intravenous  Once 08/06/18 1811 08/06/18 2306       Objective: Vitals:   08/15/18 1348 08/15/18 2242 08/16/18 0639 08/16/18 0945  BP: (!) 143/79 127/70 (!) 155/84 (!) 144/69  Pulse: 82 73 73 63  Resp: 17 18 16    Temp: 98.1 F (36.7 C) 98.6 F (37 C) 98.5 F (36.9 C)   TempSrc: Oral Oral Oral   SpO2: 99% 97% 96%   Weight:      Height:        Intake/Output Summary (Last 24 hours) at 08/16/2018 1202 Last data filed at 08/16/2018 1101 Gross per 24 hour  Intake 300 ml  Output 1350 ml  Net -1050 ml   Filed Weights   08/07/18 1100 08/11/18 2300  Weight: 92 kg 98.4 kg    Examination: General exam: Appears comfortable  HEENT: PERRLA, oral  mucosa moist, no sclera icterus or thrush Respiratory system: Clear to auscultation. Respiratory effort normal. Cardiovascular system: S1 & S2 heard, RRR.   Gastrointestinal system: Abdomen soft, non-tender, nondistended. Normal bowel sound. No organomegaly Central nervous system: Alert and oriented. No focal neurological deficits. Extremities- left elbow ROM still decreased- right hand/ wrist edema and tenderness noted but not as severe as yesterday Skin:   ulcer on right knee noted Psychiatry:  Mood & affect appropriate.     Data Reviewed: I have personally reviewed following labs and imaging studies  CBC: Recent Labs  Lab 08/10/18 0417 08/11/18 0521 08/13/18 0658 08/15/18 1028  WBC 11.0* 12.5* 8.1 10.4  HGB 11.7* 10.4* 11.1* 9.8*  HCT 37.2 32.8* 34.7* 30.9*  MCV 80.2 79.2 79.6  78.4  PLT 358 351 377 546   Basic Metabolic Panel: Recent Labs  Lab 08/10/18 0417 08/11/18 0521 08/13/18 0658 08/15/18 1028  NA 134* 135 138 130*  K 3.8 4.0 4.2 3.7  CL 96* 97* 99 95*  CO2 27 28 28 24   GLUCOSE 240* 156* 129* 251*  BUN 20 42* 24* 17  CREATININE 0.93 1.18* 0.80 0.75  CALCIUM 8.9 8.8* 9.0 8.5*   GFR: Estimated Creatinine Clearance: 71.7 mL/min (by C-G formula based on SCr of 0.75 mg/dL). Liver Function Tests: Recent Labs  Lab 08/10/18 0417 08/11/18 0521  AST 13* 13*  ALT 10 11  ALKPHOS 89 76  BILITOT 1.3* 0.5  PROT 7.6 6.6  ALBUMIN 3.2* 2.7*   No results for input(s): LIPASE, AMYLASE in the last 168 hours. No results for input(s): AMMONIA in the last 168 hours. Coagulation Profile: No results for input(s): INR, PROTIME in the last 168 hours. Cardiac Enzymes: No results for input(s): CKTOTAL, CKMB, CKMBINDEX, TROPONINI in the last 168 hours. BNP (last 3 results) No results for input(s): PROBNP in the last 8760 hours. HbA1C: No results for input(s): HGBA1C in the last 72 hours. CBG: Recent Labs  Lab 08/15/18 1153 08/15/18 1727 08/15/18 1804 08/15/18 2249 08/16/18 0826  GLUCAP 271* 412* 398* 214* 237*   Lipid Profile: No results for input(s): CHOL, HDL, LDLCALC, TRIG, CHOLHDL, LDLDIRECT in the last 72 hours. Thyroid Function Tests: No results for input(s): TSH, T4TOTAL, FREET4, T3FREE, THYROIDAB in the last 72 hours. Anemia Panel: No results for input(s): VITAMINB12, FOLATE, FERRITIN, TIBC, IRON, RETICCTPCT in the last 72 hours. Urine analysis:    Component Value Date/Time   COLORURINE YELLOW 02/11/2017 2258   APPEARANCEUR Clear 04/03/2017 1615   LABSPEC 1.010 02/11/2017 2258   PHURINE 6.0 02/11/2017 2258   GLUCOSEU Negative 04/03/2017 1615   HGBUR LARGE (A) 02/11/2017 2258   BILIRUBINUR Negative 04/03/2017 1615   KETONESUR NEGATIVE 02/11/2017 2258   PROTEINUR 2+ (A) 04/03/2017 1615   PROTEINUR 100 (A) 02/11/2017 2258   UROBILINOGEN  1.0 04/30/2015 1400   NITRITE Negative 04/03/2017 1615   NITRITE NEGATIVE 02/11/2017 2258   LEUKOCYTESUR Negative 04/03/2017 1615   Sepsis Labs: @LABRCNTIP (procalcitonin:4,lacticidven:4) ) Recent Results (from the past 240 hour(s))  Aerobic/Anaerobic Culture (surgical/deep wound)     Status: None   Collection Time: 08/09/18  8:02 PM  Result Value Ref Range Status   Specimen Description   Final    SYNOVIAL WRIST RIGHT Performed at [facility], [place] [address], [place], [place] [address]    Special Requests   Final    Normal Performed at [facility], [place] [address], [place], [place] [address]    Gram Stain   Final  ABUNDANT WBC PRESENT,BOTH PMN AND MONONUCLEAR NO ORGANISMS SEEN    Culture   Final    No growth aerobically or anaerobically. Performed at [facility], [place] [address], [place], [place] [address]    Report Status 08/14/2018 FINAL  Final         Radiology Studies: No results found.    Scheduled Meds: . amLODipine  10 mg Oral Daily  . atorvastatin  20 mg Oral QPM  . cloNIDine  0.2 mg Transdermal Weekly  . docusate sodium  100 mg Oral BID  . doxycycline  100 mg Oral Q12H  . enoxaparin (LOVENOX) injection  40 mg Subcutaneous Q24H  . hydrALAZINE  100 mg Oral TID  . insulin aspart  0-15 Units Subcutaneous TID WC  . insulin aspart  0-5 Units Subcutaneous QHS  . insulin glargine  10 Units Subcutaneous Daily  . irbesartan  300 mg Oral Daily  . isosorbide mononitrate  30 mg Oral Daily  . labetalol  200 mg Oral BID  . multivitamin with minerals  1 tablet Oral Q48H  . pantoprazole  40 mg Oral Daily  . predniSONE  10 mg Oral Nightly  . predniSONE  5 mg Oral 3 x daily with food  . [START ON 08/17/2018] predniSONE  5 mg Oral 4X daily taper  . rOPINIRole  1 mg Oral QHS  . sodium chloride flush  3 mL Intravenous Q12H  . vitamin B-12  100 mcg Oral Daily   Continuous Infusions: . sodium chloride       LOS: 10 days      Time spent in minutes: 35    Debbe Odea, MD [facility] Pager: [email or web address] Password [email or web address] 08/16/2018, 12:02 PM

## 2018-08-16 NOTE — Progress Notes (Signed)
Subjective: Feeling better. BPs better controlled but still some isolated elevated levels   Objective: Vital signs in last 24 hours: Temp:  [98.1 F (36.7 C)-98.6 F (37 C)] 98.5 F (36.9 C) (09/09 0639) Pulse Rate:  [73-82] 73 (09/09 0639) Resp:  [16-18] 16 (09/09 0639) BP: (127-195)/(70-94) 155/84 (09/09 0639) SpO2:  [96 %-99 %] 96 % (09/09 0639)  Intake/Output from previous day: 09/08 0701 - 09/09 0700 In: 540 [P.O.:540] Out: 1300 [Urine:1300] Intake/Output this shift: Total I/O In: -  Out: 800 [Urine:800]  Recent Labs    08/13/18 0658 08/15/18 1028  HGB 11.1* 9.8*   Recent Labs    08/13/18 0658 08/15/18 1028  WBC 8.1 10.4  RBC 4.36 3.94  HCT 34.7* 30.9*  PLT 377 367   Recent Labs    08/13/18 0658 08/15/18 1028  NA 138 130*  K 4.2 3.7  CL 99 95*  CO2 28 24  BUN 24* 17  CREATININE 0.80 0.75  GLUCOSE 129* 251*  CALCIUM 9.0 8.5*   No results for input(s): LABPT, INR in the last 72 hours.  Her knee looks better. No drainage from either site. Swelling mild. No erythema   Assessment/Plan: Left knee chronic PJI- discharge to SNF once hospitalist team feels her BP is adequately controlled. I appreciate their assistance. Family has requested home hospital bed which I feel is medically indicated   Beth [name] 08/16/2018, 6:55 AM

## 2018-08-17 DIAGNOSIS — M10022 Idiopathic gout, left elbow: Secondary | ICD-10-CM | POA: Diagnosis not present

## 2018-08-17 DIAGNOSIS — L02415 Cutaneous abscess of right lower limb: Secondary | ICD-10-CM | POA: Diagnosis not present

## 2018-08-17 DIAGNOSIS — Z79899 Other long term (current) drug therapy: Secondary | ICD-10-CM | POA: Diagnosis not present

## 2018-08-17 DIAGNOSIS — M549 Dorsalgia, unspecified: Secondary | ICD-10-CM | POA: Diagnosis not present

## 2018-08-17 DIAGNOSIS — E119 Type 2 diabetes mellitus without complications: Secondary | ICD-10-CM | POA: Diagnosis not present

## 2018-08-17 DIAGNOSIS — T84032D Mechanical loosening of internal right knee prosthetic joint, subsequent encounter: Secondary | ICD-10-CM | POA: Diagnosis not present

## 2018-08-17 DIAGNOSIS — N189 Chronic kidney disease, unspecified: Secondary | ICD-10-CM | POA: Diagnosis not present

## 2018-08-17 DIAGNOSIS — E118 Type 2 diabetes mellitus with unspecified complications: Secondary | ICD-10-CM | POA: Diagnosis not present

## 2018-08-17 DIAGNOSIS — R011 Cardiac murmur, unspecified: Secondary | ICD-10-CM | POA: Diagnosis not present

## 2018-08-17 DIAGNOSIS — K5900 Constipation, unspecified: Secondary | ICD-10-CM | POA: Diagnosis not present

## 2018-08-17 DIAGNOSIS — E559 Vitamin D deficiency, unspecified: Secondary | ICD-10-CM | POA: Diagnosis not present

## 2018-08-17 DIAGNOSIS — M1A00X0 Idiopathic chronic gout, unspecified site, without tophus (tophi): Secondary | ICD-10-CM | POA: Diagnosis not present

## 2018-08-17 DIAGNOSIS — N183 Chronic kidney disease, stage 3 (moderate): Secondary | ICD-10-CM | POA: Diagnosis not present

## 2018-08-17 DIAGNOSIS — I639 Cerebral infarction, unspecified: Secondary | ICD-10-CM | POA: Diagnosis not present

## 2018-08-17 DIAGNOSIS — D649 Anemia, unspecified: Secondary | ICD-10-CM | POA: Diagnosis not present

## 2018-08-17 DIAGNOSIS — I10 Essential (primary) hypertension: Secondary | ICD-10-CM | POA: Diagnosis not present

## 2018-08-17 DIAGNOSIS — M170 Bilateral primary osteoarthritis of knee: Secondary | ICD-10-CM | POA: Diagnosis not present

## 2018-08-17 DIAGNOSIS — E785 Hyperlipidemia, unspecified: Secondary | ICD-10-CM | POA: Diagnosis not present

## 2018-08-17 DIAGNOSIS — L03115 Cellulitis of right lower limb: Secondary | ICD-10-CM | POA: Diagnosis not present

## 2018-08-17 DIAGNOSIS — H259 Unspecified age-related cataract: Secondary | ICD-10-CM | POA: Diagnosis not present

## 2018-08-17 DIAGNOSIS — R54 Age-related physical debility: Secondary | ICD-10-CM | POA: Diagnosis not present

## 2018-08-17 DIAGNOSIS — M6281 Muscle weakness (generalized): Secondary | ICD-10-CM | POA: Diagnosis not present

## 2018-08-17 DIAGNOSIS — M109 Gout, unspecified: Secondary | ICD-10-CM | POA: Diagnosis not present

## 2018-08-17 DIAGNOSIS — T8453XA Infection and inflammatory reaction due to internal right knee prosthesis, initial encounter: Secondary | ICD-10-CM | POA: Diagnosis not present

## 2018-08-17 DIAGNOSIS — T8453XD Infection and inflammatory reaction due to internal right knee prosthesis, subsequent encounter: Secondary | ICD-10-CM | POA: Diagnosis not present

## 2018-08-17 DIAGNOSIS — E039 Hypothyroidism, unspecified: Secondary | ICD-10-CM | POA: Diagnosis not present

## 2018-08-17 DIAGNOSIS — R739 Hyperglycemia, unspecified: Secondary | ICD-10-CM | POA: Diagnosis not present

## 2018-08-17 DIAGNOSIS — L989 Disorder of the skin and subcutaneous tissue, unspecified: Secondary | ICD-10-CM | POA: Diagnosis not present

## 2018-08-17 LAB — BASIC METABOLIC PANEL
Anion gap: 11 (ref 5–15)
BUN: 36 mg/dL — AB (ref 8–23)
CALCIUM: 8.6 mg/dL — AB (ref 8.9–10.3)
CO2: 26 mmol/L (ref 22–32)
Chloride: 95 mmol/L — ABNORMAL LOW (ref 98–111)
Creatinine, Ser: 1.11 mg/dL — ABNORMAL HIGH (ref 0.44–1.00)
GFR calc Af Amer: 55 mL/min — ABNORMAL LOW (ref >60)
GFR calc non Af Amer: 48 mL/min — ABNORMAL LOW (ref >60)
GLUCOSE: 207 mg/dL — AB (ref 70–99)
Potassium: 4 mmol/L (ref 3.5–5.1)
Sodium: 132 mmol/L — ABNORMAL LOW (ref 135–145)

## 2018-08-17 LAB — GLUCOSE, CAPILLARY
GLUCOSE-CAPILLARY: 153 mg/dL — AB (ref 70–99)
Glucose-Capillary: 176 mg/dL — ABNORMAL HIGH (ref 70–99)

## 2018-08-17 MED ORDER — ISOSORBIDE MONONITRATE ER 30 MG PO TB24: 30.0000 mg | ORAL_TABLET | Freq: Every day | ORAL | Status: DC

## 2018-08-17 MED ORDER — CLONIDINE 0.2 MG/24HR TD PTWK: 0.2000 mg | MEDICATED_PATCH | TRANSDERMAL | 12 refills | Status: DC

## 2018-08-17 MED ORDER — ENOXAPARIN SODIUM 40 MG/0.4ML ~~LOC~~ SOLN: 40.0000 mg | SUBCUTANEOUS | 0 refills | Status: DC

## 2018-08-17 MED ORDER — PREDNISONE 5 MG (21) PO TBPK: ORAL_TABLET | ORAL | 0 refills | Status: DC

## 2018-08-17 MED ORDER — LABETALOL HCL 200 MG PO TABS: 200.0000 mg | ORAL_TABLET | Freq: Two times a day (BID) | ORAL | Status: DC

## 2018-08-17 MED ORDER — DOXYCYCLINE HYCLATE 100 MG PO TABS: 100.0000 mg | ORAL_TABLET | Freq: Two times a day (BID) | ORAL | 0 refills | Status: DC

## 2018-08-17 MED ORDER — DOXYCYCLINE HYCLATE 100 MG PO TABS: 100.0000 mg | ORAL_TABLET | Freq: Two times a day (BID) | ORAL | 0 refills | Status: AC

## 2018-08-17 NOTE — Progress Notes (Signed)
Ship broker received. Patient can d/c to [facility] medically stable.   Kathrin Greathouse, Marlinda Mike, MSW Clinical Social Worker  [phone] 08/17/2018  12:07 PM

## 2018-08-17 NOTE — Progress Notes (Addendum)
CSW informed orthopedic outpatient office to have physician to sign D/C summary and put in orders for hospital bed.   CSW informed ED social worker to check for signed d/c summary and send to facility via [facility].

## 2018-08-17 NOTE — Progress Notes (Signed)
PROGRESS NOTE    TELETHA [name]   GYI:[id number]  DOB: [birth year]  DOA: 08/06/2018 PCP: Beth Fraise, MD   Brief Narrative:  Beth [name] is a8 y.o.femalewith past medical history relevant for stage II hypertension degenerative joint disease, history of prior CVA in 2006 and history of chronic anemia who was admitted on 8/30/2019by the orthopedic team for a knee infection. [facility] hospital[facility] were asked to assist with managing HTN.     Subjective: Her gout is improving and thus so is her pain in the right hand and left elblow.     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 - steroids started by ortho- control pain    Essential hypertension, uncontrolled - outpt meds: Lasix 40 mg daily, Amlodipine 10mg , Bystolic40 daily, Olmesartan 40 mg daily, Hydralazine 100 TID - 9/7 >> currently on additional Imdur 30 mg daily and Clonidine patch 0.2 mg daily- Hydralazine has been cut back to 50 mg TID- as BP still elevated, will change back to 100 TID - 9/8- change Bystolic to Labetalol- control pain which appears to be the driving factor for HTN today - BP has improved significantly - follow without changing medications for now - would recommend d/c meds as follows:  Imdur 30 mg daily, Clonidine patch 0.2 mg daily, Hydralazine 100 mg TID, Avapro 300 mg daily, Labetalol 200 mg bID    Diabetes type 2, controlled   - sugars elevated due to steroids - ok to resume Metformin on discharge    Antimicrobials:  Anti-infectives (From admission, onward)   Start     Dose/Rate Route Frequency Ordered Stop   08/17/18 0000  doxycycline (VIBRA-TABS) 100 MG tablet  Status:  Discontinued     100 mg Oral Every 12 hours 08/17/18 0830 08/17/18    08/17/18 0000  doxycycline (VIBRA-TABS) 100 MG tablet     100 mg Oral Every 12 hours 08/17/18 0849 09/12/18 2359   08/13/18 1000  doxycycline (VIBRA-TABS) tablet 100 mg     100 mg Oral Every 12 hours 08/12/18 0840     08/13/18 0000  doxycycline (VIBRA-TABS) 100 MG tablet  Status:  Discontinued     100 mg Oral Every 12 hours 08/12/18 0849 08/17/18    08/09/18 1200  vancomycin (VANCOCIN) 1,250 mg in sodium chloride 0.9 % 250 mL IVPB  Status:  Discontinued     1,250 mg 166.7 mL/hr over 90 Minutes Intravenous Every 48 hours 08/08/18 1316 08/13/18 0642   08/07/18 2000  vancomycin (VANCOCIN) IVPB 750 mg/150 ml premix  Status:  Discontinued     750 mg 150 mL/hr over 60 Minutes Intravenous Every 24 hours 08/06/18 2021 08/08/18 1316   08/06/18 1830  vancomycin (VANCOCIN) 1,500 mg in sodium chloride 0.9 % 500 mL IVPB     1,500 mg 250 mL/hr over 120 Minutes Intravenous  Once 08/06/18 1811 08/06/18 2306       Objective: Vitals:   08/16/18 1729 08/16/18 2151 08/17/18 0553 08/17/18 1057  BP: (!) 157/79 (!) 161/81 140/79 140/72  Pulse: 79 71 70 66  Resp:  18 18   Temp:  98.1 F (36.7 C) 98.1 F (36.7 C)   TempSrc:  Oral Oral   SpO2:  95% 92%   Weight:      Height:        Intake/Output Summary (Last 24 hours) at  08/17/2018 1121 Last data filed at 08/17/2018 0600 Gross per 24 hour  Intake 480 ml  Output 600 ml  Net -120 ml   Filed Weights   08/07/18 1100 08/11/18 2300  Weight: 92 kg 98.4 kg    Examination: General exam: Appears comfortable  HEENT: PERRLA, oral mucosa moist, no sclera icterus or thrush Respiratory system: Clear to auscultation. Respiratory effort normal. Cardiovascular system: S1 & S2 heard, RRR.   Gastrointestinal system: Abdomen soft, non-tender, nondistended. Normal bowel sound. No organomegaly Central nervous system: Alert and oriented. No focal neurological deficits. Extremities: No cyanosis, clubbing or edema Skin:  ulcer on right knee Psychiatry:  Mood & affect appropriate.     Data Reviewed: I have personally reviewed following labs and imaging studies  CBC: Recent  Labs  Lab 08/11/18 0521 08/13/18 0658 08/15/18 1028  WBC 12.5* 8.1 10.4  HGB 10.4* 11.1* 9.8*  HCT 32.8* 34.7* 30.9*  MCV 79.2 79.6 78.4  PLT 351 377 841   Basic Metabolic Panel: Recent Labs  Lab 08/11/18 0521 08/13/18 0658 08/15/18 1028 08/17/18 0426  NA 135 138 130* 132*  K 4.0 4.2 3.7 4.0  CL 97* 99 95* 95*  CO2 28 28 24 26   GLUCOSE 156* 129* 251* 207*  BUN 42* 24* 17 36*  CREATININE 1.18* 0.80 0.75 1.11*  CALCIUM 8.8* 9.0 8.5* 8.6*   GFR: Estimated Creatinine Clearance: 51.7 mL/min (A) (by C-G formula based on SCr of 1.11 mg/dL (H)). Liver Function Tests: Recent Labs  Lab 08/11/18 0521  AST 13*  ALT 11  ALKPHOS 76  BILITOT 0.5  PROT 6.6  ALBUMIN 2.7*   No results for input(s): LIPASE, AMYLASE in the last 168 hours. No results for input(s): AMMONIA in the last 168 hours. Coagulation Profile: No results for input(s): INR, PROTIME in the last 168 hours. Cardiac Enzymes: No results for input(s): CKTOTAL, CKMB, CKMBINDEX, TROPONINI in the last 168 hours. BNP (last 3 results) No results for input(s): PROBNP in the last 8760 hours. HbA1C: No results for input(s): HGBA1C in the last 72 hours. CBG: Recent Labs  Lab 08/16/18 0826 08/16/18 1202 08/16/18 1648 08/16/18 2152 08/17/18 0739  GLUCAP 237* 138* 229* 148* 176*   Lipid Profile: No results for input(s): CHOL, HDL, LDLCALC, TRIG, CHOLHDL, LDLDIRECT in the last 72 hours. Thyroid Function Tests: No results for input(s): TSH, T4TOTAL, FREET4, T3FREE, THYROIDAB in the last 72 hours. Anemia Panel: No results for input(s): VITAMINB12, FOLATE, FERRITIN, TIBC, IRON, RETICCTPCT in the last 72 hours. Urine analysis:    Component Value Date/Time   COLORURINE YELLOW 02/11/2017 2258   APPEARANCEUR Clear 04/03/2017 1615   LABSPEC 1.010 02/11/2017 2258   PHURINE 6.0 02/11/2017 2258   GLUCOSEU Negative 04/03/2017 1615   HGBUR LARGE (A) 02/11/2017 2258   BILIRUBINUR Negative 04/03/2017 1615   KETONESUR  NEGATIVE 02/11/2017 2258   PROTEINUR 2+ (A) 04/03/2017 1615   PROTEINUR 100 (A) 02/11/2017 2258   UROBILINOGEN 1.0 04/30/2015 1400   NITRITE Negative 04/03/2017 1615   NITRITE NEGATIVE 02/11/2017 2258   LEUKOCYTESUR Negative 04/03/2017 1615   Sepsis Labs: @LABRCNTIP (procalcitonin:4,lacticidven:4) ) Recent Results (from the past 240 hour(s))  Aerobic/Anaerobic Culture (surgical/deep wound)     Status: None   Collection Time: 08/09/18  8:02 PM  Result Value Ref Range Status   Specimen Description   Final    SYNOVIAL WRIST RIGHT Performed at [facility], [place] [address] [place], [place] [address]    Special Requests   Final  Normal Performed at [facility], [place] [address], [place], [place] [address]    Gram Stain   Final    ABUNDANT WBC PRESENT,BOTH PMN AND MONONUCLEAR NO ORGANISMS SEEN    Culture   Final    No growth aerobically or anaerobically. Performed at [facility], [place] [address] [place], [place] [address]    Report Status 08/14/2018 FINAL  Final         Radiology Studies: No results found.    Scheduled Meds: . amLODipine  10 mg Oral Daily  . atorvastatin  20 mg Oral QPM  . cloNIDine  0.2 mg Transdermal Weekly  . docusate sodium  100 mg Oral BID  . doxycycline  100 mg Oral Q12H  . enoxaparin (LOVENOX) injection  40 mg Subcutaneous Q24H  . hydrALAZINE  100 mg Oral TID  . insulin aspart  0-15 Units Subcutaneous TID WC  . insulin aspart  0-5 Units Subcutaneous QHS  . insulin glargine  10 Units Subcutaneous Daily  . irbesartan  300 mg Oral Daily  . isosorbide mononitrate  30 mg Oral Daily  . labetalol  200 mg Oral BID  . multivitamin with minerals  1 tablet Oral Q48H  . pantoprazole  40 mg Oral Daily  . predniSONE  5 mg Oral 4X daily taper  . rOPINIRole  1 mg Oral QHS  . sodium chloride flush  3 mL Intravenous Q12H  . vitamin B-12  100 mcg Oral Daily   Continuous Infusions: . sodium chloride        LOS: 11 days    Time spent in minutes: 35    Beth Odea, MD [facility] Pager: [email or web address] Password [email or web address] 08/17/2018, 11:21 AM

## 2018-08-17 NOTE — Clinical Social Work Placement (Signed)
Nurse call report to: [phone]  CLINICAL SOCIAL WORK PLACEMENT  NOTE  Date:  08/17/2018  Patient Details  Name: Beth [name] MRN: [id number] Date of Birth: October 24, 1946  Clinical Social Work is seeking post-discharge placement for this patient at the [place] level of care (*CSW will initial, date and re-position this form in  chart as items are completed):  Yes   Patient/family provided with [place] Work Department's list of facilities offering this level of care within the geographic area requested by the patient (or if unable, by the patient's family).  Yes   Patient/family informed of their freedom to choose among providers that offer the needed level of care, that participate in Medicare, Medicaid or managed care program needed by the patient, have an available bed and are willing to accept the patient.  Yes   Patient/family informed of [facility]'s ownership interest in [facility] and [facility], as well as of the fact that they are under no obligation to receive care at these facilities.  PASRR submitted to EDS on 08/16/18     PASRR number received on 08/16/18     Existing PASRR number confirmed on       FL2 transmitted to all facilities in geographic area requested by pt/family on       FL2 transmitted to all facilities within larger geographic area on 08/16/18     Patient informed that his/her managed care company has contracts with or will negotiate with certain facilities, including the following:  [employer] informed of bed offers received.  Patient chooses bed at [facility]     Physician recommends and patient chooses bed at      Patient to be transferred to [facility] on 08/17/18.  Patient to be transferred to facility by [facility]     Patient family notified on 08/17/18 of transfer.  Name of family member notified:  Daughter.      PHYSICIAN Please prepare  priority discharge summary, including medications     Additional Comment:    _______________________________________________ Lia Hopping, LCSW 08/17/2018, 1:01 PM

## 2018-08-17 NOTE — Progress Notes (Signed)
Pt alert, oriented, tolerating diet. D/C instruction and prescription was given and report was called to the facility.

## 2018-08-17 NOTE — Progress Notes (Signed)
CSW faxed signed discharge summary to [facility]   Wendelyn Breslow, [facility]  [phone]

## 2018-08-17 NOTE — Progress Notes (Signed)
   Subjective:   Pt is feeling much better today. No pain in the right knee. States gout pain in her right hand and left elbow is much improved this AM. BP is improving overall.   Objective: Vital signs in last 24 hours: Temp:  [97.6 F (36.4 C)-98.1 F (36.7 C)] 98.1 F (36.7 C) (09/10 0553) Pulse Rate:  [62-79] 70 (09/10 0553) Resp:  [18] 18 (09/10 0553) BP: (125-161)/(60-81) 140/79 (09/10 0553) SpO2:  [92 %-97 %] 92 % (09/10 0553)  Intake/Output from previous day:  Intake/Output Summary (Last 24 hours) at 08/17/2018 0844 Last data filed at 08/17/2018 0600 Gross per 24 hour  Intake 840 ml  Output 800 ml  Net 40 ml     Labs: Recent Labs    08/15/18 1028  HGB 9.8*   Recent Labs    08/15/18 1028  WBC 10.4  RBC 3.94  HCT 30.9*  PLT 367   Recent Labs    08/15/18 1028 08/17/18 0426  NA 130* 132*  K 3.7 4.0  CL 95* 95*  CO2 24 26  BUN 17 36*  CREATININE 0.75 1.11*  GLUCOSE 251* 207*  CALCIUM 8.5* 8.6*   Exam: General - Patient is Alert and Oriented Extremity - Right knee has mild swelling, no active drainage from open site. No erythema. ROM is improved in the right wrist and left elbow.  Dressing - dressing C/D/I Motor Function - intact, moving foot and toes well on exam.   Past Medical History:  Diagnosis Date  . Anemia   . Arthritis    Knee both knees  . Blood transfusion without reported diagnosis 2012   anemia;pt denies transfusion stated was only on iron tablet  . Cataract    left  . CKD (chronic kidney disease), stage III ([place])   . CVA (cerebral infarction)    2006  . Diabetes mellitus without complication ([place])   . Family history of anesthesia complication    sister very slow to awaken after anesthesia;severe vomiting   . Gout    left elbow  . Heart murmur   . 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 08-09-14 Right eye  . Hyperlipidemia   . Hypertension   . Nocturia    3-4 times per night  . Stroke ([place]) 2006   x 1 no deficits  noted     Assessment/Plan:    Principal Problem:   Infection of total right knee replacement (HCC) Active Problems:   Essential hypertension   Diabetes type 2, controlled ([place])   Abscess of right leg   Steroid-induced hyperglycemia  Estimated body mass index is 36.11 kg/m as calculated from the following:   Height as of this encounter: 5\' 5"  (1.651 m).   Weight as of this encounter: 98.4 kg.  DVT Prophylaxis - Lovenox Weight bearing as tolerated.  Pt is ready for discharge from orthopedic standpoint. Pt has bed secured at [facility]. If medicine checks off on patient's blood pressure and sugars, she can discharge today to SNF.   Theresa Duty, PA-C Orthopedic Surgery 08/17/2018, 8:44 AM

## 2018-08-25 DIAGNOSIS — T8453XD Infection and inflammatory reaction due to internal right knee prosthesis, subsequent encounter: Secondary | ICD-10-CM | POA: Diagnosis not present

## 2018-08-25 DIAGNOSIS — R54 Age-related physical debility: Secondary | ICD-10-CM | POA: Diagnosis not present

## 2018-08-25 DIAGNOSIS — M1A00X0 Idiopathic chronic gout, unspecified site, without tophus (tophi): Secondary | ICD-10-CM | POA: Diagnosis not present

## 2018-08-25 DIAGNOSIS — I10 Essential (primary) hypertension: Secondary | ICD-10-CM | POA: Diagnosis not present

## 2018-09-02 ENCOUNTER — Other Ambulatory Visit: Payer: Self-pay | Admitting: Family Medicine

## 2018-09-03 ENCOUNTER — Other Ambulatory Visit: Payer: Self-pay | Admitting: Family Medicine

## 2018-09-07 ENCOUNTER — Ambulatory Visit: Payer: Medicare Other | Admitting: Family Medicine

## 2018-09-07 ENCOUNTER — Ambulatory Visit: Payer: Medicare Other | Admitting: Internal Medicine

## 2018-09-09 ENCOUNTER — Ambulatory Visit: Payer: Medicare Other | Admitting: Family Medicine

## 2018-09-09 DIAGNOSIS — Z4789 Encounter for other orthopedic aftercare: Secondary | ICD-10-CM | POA: Diagnosis not present

## 2018-09-11 DIAGNOSIS — E1122 Type 2 diabetes mellitus with diabetic chronic kidney disease: Secondary | ICD-10-CM | POA: Diagnosis not present

## 2018-09-11 DIAGNOSIS — T8453XD Infection and inflammatory reaction due to internal right knee prosthesis, subsequent encounter: Secondary | ICD-10-CM | POA: Diagnosis not present

## 2018-09-11 DIAGNOSIS — I129 Hypertensive chronic kidney disease with stage 1 through stage 4 chronic kidney disease, or unspecified chronic kidney disease: Secondary | ICD-10-CM | POA: Diagnosis not present

## 2018-09-11 DIAGNOSIS — Z9181 History of falling: Secondary | ICD-10-CM | POA: Diagnosis not present

## 2018-09-11 DIAGNOSIS — M10022 Idiopathic gout, left elbow: Secondary | ICD-10-CM | POA: Diagnosis not present

## 2018-09-11 DIAGNOSIS — Z7984 Long term (current) use of oral hypoglycemic drugs: Secondary | ICD-10-CM | POA: Diagnosis not present

## 2018-09-11 DIAGNOSIS — E785 Hyperlipidemia, unspecified: Secondary | ICD-10-CM | POA: Diagnosis not present

## 2018-09-11 DIAGNOSIS — H259 Unspecified age-related cataract: Secondary | ICD-10-CM | POA: Diagnosis not present

## 2018-09-11 DIAGNOSIS — N183 Chronic kidney disease, stage 3 (moderate): Secondary | ICD-10-CM | POA: Diagnosis not present

## 2018-09-11 DIAGNOSIS — Z8673 Personal history of transient ischemic attack (TIA), and cerebral infarction without residual deficits: Secondary | ICD-10-CM | POA: Diagnosis not present

## 2018-09-11 DIAGNOSIS — M170 Bilateral primary osteoarthritis of knee: Secondary | ICD-10-CM | POA: Diagnosis not present

## 2018-09-11 DIAGNOSIS — D631 Anemia in chronic kidney disease: Secondary | ICD-10-CM | POA: Diagnosis not present

## 2018-09-11 DIAGNOSIS — Z79891 Long term (current) use of opiate analgesic: Secondary | ICD-10-CM | POA: Diagnosis not present

## 2018-09-14 DIAGNOSIS — Z9181 History of falling: Secondary | ICD-10-CM | POA: Diagnosis not present

## 2018-09-14 DIAGNOSIS — E785 Hyperlipidemia, unspecified: Secondary | ICD-10-CM | POA: Diagnosis not present

## 2018-09-14 DIAGNOSIS — H259 Unspecified age-related cataract: Secondary | ICD-10-CM | POA: Diagnosis not present

## 2018-09-14 DIAGNOSIS — T8453XD Infection and inflammatory reaction due to internal right knee prosthesis, subsequent encounter: Secondary | ICD-10-CM | POA: Diagnosis not present

## 2018-09-14 DIAGNOSIS — I129 Hypertensive chronic kidney disease with stage 1 through stage 4 chronic kidney disease, or unspecified chronic kidney disease: Secondary | ICD-10-CM | POA: Diagnosis not present

## 2018-09-14 DIAGNOSIS — Z7984 Long term (current) use of oral hypoglycemic drugs: Secondary | ICD-10-CM | POA: Diagnosis not present

## 2018-09-14 DIAGNOSIS — Z79891 Long term (current) use of opiate analgesic: Secondary | ICD-10-CM | POA: Diagnosis not present

## 2018-09-14 DIAGNOSIS — Z8673 Personal history of transient ischemic attack (TIA), and cerebral infarction without residual deficits: Secondary | ICD-10-CM | POA: Diagnosis not present

## 2018-09-14 DIAGNOSIS — M10022 Idiopathic gout, left elbow: Secondary | ICD-10-CM | POA: Diagnosis not present

## 2018-09-14 DIAGNOSIS — E1122 Type 2 diabetes mellitus with diabetic chronic kidney disease: Secondary | ICD-10-CM | POA: Diagnosis not present

## 2018-09-14 DIAGNOSIS — N183 Chronic kidney disease, stage 3 (moderate): Secondary | ICD-10-CM | POA: Diagnosis not present

## 2018-09-14 DIAGNOSIS — M170 Bilateral primary osteoarthritis of knee: Secondary | ICD-10-CM | POA: Diagnosis not present

## 2018-09-14 DIAGNOSIS — D631 Anemia in chronic kidney disease: Secondary | ICD-10-CM | POA: Diagnosis not present

## 2018-09-16 DIAGNOSIS — E785 Hyperlipidemia, unspecified: Secondary | ICD-10-CM | POA: Diagnosis not present

## 2018-09-16 DIAGNOSIS — T8453XD Infection and inflammatory reaction due to internal right knee prosthesis, subsequent encounter: Secondary | ICD-10-CM | POA: Diagnosis not present

## 2018-09-16 DIAGNOSIS — Z79891 Long term (current) use of opiate analgesic: Secondary | ICD-10-CM | POA: Diagnosis not present

## 2018-09-16 DIAGNOSIS — I129 Hypertensive chronic kidney disease with stage 1 through stage 4 chronic kidney disease, or unspecified chronic kidney disease: Secondary | ICD-10-CM | POA: Diagnosis not present

## 2018-09-16 DIAGNOSIS — Z7984 Long term (current) use of oral hypoglycemic drugs: Secondary | ICD-10-CM | POA: Diagnosis not present

## 2018-09-16 DIAGNOSIS — D631 Anemia in chronic kidney disease: Secondary | ICD-10-CM | POA: Diagnosis not present

## 2018-09-16 DIAGNOSIS — E1122 Type 2 diabetes mellitus with diabetic chronic kidney disease: Secondary | ICD-10-CM | POA: Diagnosis not present

## 2018-09-16 DIAGNOSIS — M170 Bilateral primary osteoarthritis of knee: Secondary | ICD-10-CM | POA: Diagnosis not present

## 2018-09-16 DIAGNOSIS — N183 Chronic kidney disease, stage 3 (moderate): Secondary | ICD-10-CM | POA: Diagnosis not present

## 2018-09-16 DIAGNOSIS — M10022 Idiopathic gout, left elbow: Secondary | ICD-10-CM | POA: Diagnosis not present

## 2018-09-16 DIAGNOSIS — H259 Unspecified age-related cataract: Secondary | ICD-10-CM | POA: Diagnosis not present

## 2018-09-16 DIAGNOSIS — Z9181 History of falling: Secondary | ICD-10-CM | POA: Diagnosis not present

## 2018-09-16 DIAGNOSIS — Z8673 Personal history of transient ischemic attack (TIA), and cerebral infarction without residual deficits: Secondary | ICD-10-CM | POA: Diagnosis not present

## 2018-09-20 DIAGNOSIS — I129 Hypertensive chronic kidney disease with stage 1 through stage 4 chronic kidney disease, or unspecified chronic kidney disease: Secondary | ICD-10-CM | POA: Diagnosis not present

## 2018-09-20 DIAGNOSIS — Z8673 Personal history of transient ischemic attack (TIA), and cerebral infarction without residual deficits: Secondary | ICD-10-CM | POA: Diagnosis not present

## 2018-09-20 DIAGNOSIS — D631 Anemia in chronic kidney disease: Secondary | ICD-10-CM | POA: Diagnosis not present

## 2018-09-20 DIAGNOSIS — M10022 Idiopathic gout, left elbow: Secondary | ICD-10-CM | POA: Diagnosis not present

## 2018-09-20 DIAGNOSIS — N183 Chronic kidney disease, stage 3 (moderate): Secondary | ICD-10-CM | POA: Diagnosis not present

## 2018-09-20 DIAGNOSIS — H259 Unspecified age-related cataract: Secondary | ICD-10-CM | POA: Diagnosis not present

## 2018-09-20 DIAGNOSIS — M170 Bilateral primary osteoarthritis of knee: Secondary | ICD-10-CM | POA: Diagnosis not present

## 2018-09-20 DIAGNOSIS — E1122 Type 2 diabetes mellitus with diabetic chronic kidney disease: Secondary | ICD-10-CM | POA: Diagnosis not present

## 2018-09-20 DIAGNOSIS — Z79891 Long term (current) use of opiate analgesic: Secondary | ICD-10-CM | POA: Diagnosis not present

## 2018-09-20 DIAGNOSIS — Z7984 Long term (current) use of oral hypoglycemic drugs: Secondary | ICD-10-CM | POA: Diagnosis not present

## 2018-09-20 DIAGNOSIS — T8453XD Infection and inflammatory reaction due to internal right knee prosthesis, subsequent encounter: Secondary | ICD-10-CM | POA: Diagnosis not present

## 2018-09-20 DIAGNOSIS — Z9181 History of falling: Secondary | ICD-10-CM | POA: Diagnosis not present

## 2018-09-20 DIAGNOSIS — E785 Hyperlipidemia, unspecified: Secondary | ICD-10-CM | POA: Diagnosis not present

## 2018-09-22 ENCOUNTER — Ambulatory Visit: Payer: Medicare Other | Admitting: Family Medicine

## 2018-09-23 DIAGNOSIS — Z9181 History of falling: Secondary | ICD-10-CM | POA: Diagnosis not present

## 2018-09-23 DIAGNOSIS — H259 Unspecified age-related cataract: Secondary | ICD-10-CM | POA: Diagnosis not present

## 2018-09-23 DIAGNOSIS — D631 Anemia in chronic kidney disease: Secondary | ICD-10-CM | POA: Diagnosis not present

## 2018-09-23 DIAGNOSIS — M10022 Idiopathic gout, left elbow: Secondary | ICD-10-CM | POA: Diagnosis not present

## 2018-09-23 DIAGNOSIS — Z79891 Long term (current) use of opiate analgesic: Secondary | ICD-10-CM | POA: Diagnosis not present

## 2018-09-23 DIAGNOSIS — T8453XD Infection and inflammatory reaction due to internal right knee prosthesis, subsequent encounter: Secondary | ICD-10-CM | POA: Diagnosis not present

## 2018-09-23 DIAGNOSIS — N183 Chronic kidney disease, stage 3 (moderate): Secondary | ICD-10-CM | POA: Diagnosis not present

## 2018-09-23 DIAGNOSIS — E785 Hyperlipidemia, unspecified: Secondary | ICD-10-CM | POA: Diagnosis not present

## 2018-09-23 DIAGNOSIS — M170 Bilateral primary osteoarthritis of knee: Secondary | ICD-10-CM | POA: Diagnosis not present

## 2018-09-23 DIAGNOSIS — Z7984 Long term (current) use of oral hypoglycemic drugs: Secondary | ICD-10-CM | POA: Diagnosis not present

## 2018-09-23 DIAGNOSIS — I129 Hypertensive chronic kidney disease with stage 1 through stage 4 chronic kidney disease, or unspecified chronic kidney disease: Secondary | ICD-10-CM | POA: Diagnosis not present

## 2018-09-23 DIAGNOSIS — E1122 Type 2 diabetes mellitus with diabetic chronic kidney disease: Secondary | ICD-10-CM | POA: Diagnosis not present

## 2018-09-23 DIAGNOSIS — Z8673 Personal history of transient ischemic attack (TIA), and cerebral infarction without residual deficits: Secondary | ICD-10-CM | POA: Diagnosis not present

## 2018-09-27 DIAGNOSIS — Z9181 History of falling: Secondary | ICD-10-CM | POA: Diagnosis not present

## 2018-09-27 DIAGNOSIS — Z7984 Long term (current) use of oral hypoglycemic drugs: Secondary | ICD-10-CM | POA: Diagnosis not present

## 2018-09-27 DIAGNOSIS — D631 Anemia in chronic kidney disease: Secondary | ICD-10-CM | POA: Diagnosis not present

## 2018-09-27 DIAGNOSIS — M170 Bilateral primary osteoarthritis of knee: Secondary | ICD-10-CM | POA: Diagnosis not present

## 2018-09-27 DIAGNOSIS — E785 Hyperlipidemia, unspecified: Secondary | ICD-10-CM | POA: Diagnosis not present

## 2018-09-27 DIAGNOSIS — M10022 Idiopathic gout, left elbow: Secondary | ICD-10-CM | POA: Diagnosis not present

## 2018-09-27 DIAGNOSIS — T8453XD Infection and inflammatory reaction due to internal right knee prosthesis, subsequent encounter: Secondary | ICD-10-CM | POA: Diagnosis not present

## 2018-09-27 DIAGNOSIS — Z79891 Long term (current) use of opiate analgesic: Secondary | ICD-10-CM | POA: Diagnosis not present

## 2018-09-27 DIAGNOSIS — H259 Unspecified age-related cataract: Secondary | ICD-10-CM | POA: Diagnosis not present

## 2018-09-27 DIAGNOSIS — E1122 Type 2 diabetes mellitus with diabetic chronic kidney disease: Secondary | ICD-10-CM | POA: Diagnosis not present

## 2018-09-27 DIAGNOSIS — N183 Chronic kidney disease, stage 3 (moderate): Secondary | ICD-10-CM | POA: Diagnosis not present

## 2018-09-27 DIAGNOSIS — Z8673 Personal history of transient ischemic attack (TIA), and cerebral infarction without residual deficits: Secondary | ICD-10-CM | POA: Diagnosis not present

## 2018-09-27 DIAGNOSIS — I129 Hypertensive chronic kidney disease with stage 1 through stage 4 chronic kidney disease, or unspecified chronic kidney disease: Secondary | ICD-10-CM | POA: Diagnosis not present

## 2018-09-28 ENCOUNTER — Ambulatory Visit (INDEPENDENT_AMBULATORY_CARE_PROVIDER_SITE_OTHER): Payer: Medicare Other | Admitting: Family Medicine

## 2018-09-28 ENCOUNTER — Encounter: Payer: Self-pay | Admitting: Family Medicine

## 2018-09-28 VITALS — BP 134/74 | HR 66 | Temp 97.5°F

## 2018-09-28 DIAGNOSIS — I10 Essential (primary) hypertension: Secondary | ICD-10-CM

## 2018-09-28 DIAGNOSIS — E782 Mixed hyperlipidemia: Secondary | ICD-10-CM

## 2018-09-28 DIAGNOSIS — Z23 Encounter for immunization: Secondary | ICD-10-CM

## 2018-09-28 DIAGNOSIS — E119 Type 2 diabetes mellitus without complications: Secondary | ICD-10-CM | POA: Diagnosis not present

## 2018-09-28 LAB — URINALYSIS
BILIRUBIN UA: NEGATIVE
Glucose, UA: NEGATIVE
Leukocytes, UA: NEGATIVE
NITRITE UA: NEGATIVE
PH UA: 5 (ref 5.0–7.5)
RBC, UA: NEGATIVE
Specific Gravity, UA: 1.02 (ref 1.005–1.030)
UUROB: 0.2 mg/dL (ref 0.2–1.0)

## 2018-09-28 LAB — BAYER DCA HB A1C WAIVED: HB A1C: 5.9 % (ref <7.0)

## 2018-09-28 MED ORDER — NEBIVOLOL HCL 20 MG PO TABS: ORAL_TABLET | ORAL | 1 refills | Status: DC

## 2018-09-28 MED ORDER — OXYCODONE HCL 5 MG PO TABS: 5.0000 mg | ORAL_TABLET | Freq: Four times a day (QID) | ORAL | 0 refills | Status: DC | PRN

## 2018-09-28 MED ORDER — ATORVASTATIN CALCIUM 20 MG PO TABS: 20.0000 mg | ORAL_TABLET | Freq: Every evening | ORAL | 1 refills | Status: DC

## 2018-09-28 MED ORDER — HYDRALAZINE HCL 100 MG PO TABS: ORAL_TABLET | ORAL | 1 refills | Status: DC

## 2018-09-28 MED ORDER — LABETALOL HCL 200 MG PO TABS: 200.0000 mg | ORAL_TABLET | Freq: Two times a day (BID) | ORAL | 1 refills | Status: DC

## 2018-09-28 MED ORDER — ACETAMINOPHEN 500 MG PO TABS: 1000.0000 mg | ORAL_TABLET | Freq: Four times a day (QID) | ORAL | 1 refills | Status: DC | PRN

## 2018-09-28 MED ORDER — AMLODIPINE BESYLATE 10 MG PO TABS: ORAL_TABLET | ORAL | 1 refills | Status: DC

## 2018-09-28 MED ORDER — METFORMIN HCL ER 500 MG PO TB24: ORAL_TABLET | ORAL | 1 refills | Status: DC

## 2018-09-28 MED ORDER — OLMESARTAN MEDOXOMIL 40 MG PO TABS: ORAL_TABLET | ORAL | 1 refills | Status: DC

## 2018-09-28 NOTE — Progress Notes (Signed)
Subjective:  Patient ID: Beth [name],  female    DOB: [birth year]  Age: 72 y.o.    CC: Diabetes (3 mos); Hyperlipidemia; and Hypertension   HPI Beth [name] presents for  follow-up of hypertension. Patient has no history of headache chest pain or shortness of breath or recent cough. Patient also denies symptoms of TIA such as numbness weakness lateralizing. Patient denies side effects from medication. States taking it regularly.  Patient also  in for follow-up of elevated cholesterol. Doing well without complaints on current medication. Denies side effects  including myalgia and arthralgia and nausea. Also in today for liver function testing. Currently no chest pain, shortness of breath or other cardiovascular related symptoms noted.  Follow-up of diabetes. Patient does check blood sugar at home. Readings run between 90 and 150 Patient denies symptoms such as excessive hunger or urinary frequency, excessive hunger, nausea No significant hypoglycemic spells noted. Medications reviewed. Pt reports taking them regularly. Pt. denies complication/adverse reaction today.   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 To see Ortho, Dr. Raphael Gibney in three days for follow up. Needs srefills of pain meds for now. Pt in wheelchair due to sevcerity of knee pain from infection.    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   She has a past surgical history that includes Carpal tunnel release (Right, 1983); colonscopy (June 21, 2014); nasal cauterization (2012); Total knee arthroplasty (Right, 07/03/2014); Patellar tendon repair (Right,  08/11/2014); Joint replacement (06/2014); I&D knee with poly exchange (Right, 10/02/2014); Excisional total knee arthroplasty with antibiotic spacers (Right, 02/16/2015); Reimplantation of total knee (Right, 05/23/2015); Abdominal hysterectomy (1983); Tubal ligation; and Incision and drainage of wound (Right, 01/14/2017).   Her family history includes Cancer in her mother; Diabetes in her son and son; Heart disease in her brother; Hypertension in her father; Kidney disease in her daughter; Ovarian cancer in her mother; Peripheral vascular disease in her father.She reports that she has never smoked. She has never used smokeless tobacco. She reports that she does not drink alcohol or use drugs.  Current Outpatient Medications on File Prior to Visit  Medication Sig Dispense Refill  . Cyanocobalamin (VITAMIN B-12 PO) Take 1 tablet by mouth daily.    Marland Kitchen GAVILAX powder MIX 17 GRAMS INTO 8OZ OF WATER AND DRINK DAILY 510 g 0  . Multiple Vitamin (MULTIVITAMIN WITH MINERALS) TABS tablet Take 1 tablet by mouth every other day.    . ONE TOUCH ULTRA TEST test strip Use to check glucose up to four times daily 400 each 0  . ONETOUCH DELICA LANCETS 91Y MISC USE TO CHECK GLUCOSE UP TO  4 TIMES DAILY 400 each 0  . enoxaparin (LOVENOX) 40 MG/0.4ML injection Inject 0.4 mLs (40 mg total) into the skin daily for 4 days. 1.6 mL 0   Current Facility-Administered Medications on File Prior to Visit  Medication Dose Route Frequency Provider Last Rate Last Dose  . tranexamic acid (CYKLOKAPRON) 2,000 mg in sodium chloride 0.9 % 50 mL Topical Application  7,829 mg Topical Once [name], MD        ROS Review of Systems  Constitutional: Negative.   HENT: Negative for congestion.   Eyes: Negative for visual disturbance.  Respiratory: Negative  for shortness of breath.   Cardiovascular: Negative for chest pain.  Gastrointestinal: Negative for abdominal pain, constipation, diarrhea, nausea and vomiting.  Genitourinary:  Negative for difficulty urinating.  Musculoskeletal: Negative for arthralgias and myalgias.  Neurological: Negative for headaches.  Psychiatric/Behavioral: Negative for sleep disturbance.    Objective:  BP 134/74   Pulse 66   Temp (!) 97.5 F (36.4 C) (Oral)   BP Readings from Last 3 Encounters:  09/28/18 134/74  08/17/18 129/71  06/07/18 122/66    Wt Readings from Last 3 Encounters:  08/11/18 217 lb (98.4 kg)  06/07/18 203 lb 2 oz (92.1 kg)  12/30/17 215 lb (97.5 kg)     Physical Exam  Constitutional: She is oriented to person, place, and time. She appears well-developed and well-nourished. No distress.  HENT:  Head: Normocephalic and atraumatic.  Right Ear: External ear normal.  Left Ear: External ear normal.  Nose: Nose normal.  Mouth/Throat: Oropharynx is clear and moist.  Eyes: Pupils are equal, round, and reactive to light. Conjunctivae and EOM are normal.  Neck: Normal range of motion. Neck supple. No thyromegaly present.  Cardiovascular: Normal rate, regular rhythm and normal heart sounds.  No murmur heard. Pulmonary/Chest: Effort normal and breath sounds normal. No respiratory distress. She has no wheezes. She has no rales.  Abdominal: Soft. Bowel sounds are normal. She exhibits no distension. There is no tenderness.  Lymphadenopathy:    She has no cervical adenopathy.  Neurological: She is alert and oriented to person, place, and time. She has normal reflexes.  Skin: Skin is warm and dry.  Psychiatric: She has a normal mood and affect. Her behavior is normal. Judgment and thought content normal.        Assessment & Plan:   Etha was seen today for diabetes, hyperlipidemia and hypertension.  Diagnoses and all orders for this visit:  Controlled type 2 diabetes mellitus without complication, without long-term current use of insulin (HCC) -     Microalbumin / creatinine urine ratio -     Urinalysis -     Bayer DCA Hb A1c Waived  Essential  hypertension -     CBC with Differential/Platelet -     CMP14+EGFR  Mixed hyperlipidemia -     Lipid panel  Encounter for immunization -     Flu vaccine HIGH DOSE PF  Other orders -     olmesartan (BENICAR) 40 MG tablet; TAKE ONE (1) TABLET EACH DAY -     Nebivolol HCl (BYSTOLIC) 20 MG TABS; TAKE ONE (1) TABLET EACH DAY -     metFORMIN (GLUCOPHAGE-XR) 500 MG 24 hr tablet; TAKE ONE (1) TABLET EACH DAY -     labetalol (NORMODYNE) 200 MG tablet; Take 1 tablet (200 mg total) by mouth 2 (two) times daily. -     hydrALAZINE (APRESOLINE) 100 MG tablet; TAKE ONE (1) TABLET THREE (3) TIMES EACH DAY -     atorvastatin (LIPITOR) 20 MG tablet; Take 1 tablet (20 mg total) by mouth every evening. -     amLODipine (NORVASC) 10 MG tablet; TAKE 1 TABLET DAILY IN THE EVENING -     acetaminophen (TYLENOL) 500 MG tablet; Take 2 tablets (1,000 mg total) by mouth every 6 (six) hours as needed for mild pain. -     Discontinue: oxyCODONE (OXY IR/ROXICODONE) 5 MG immediate release tablet; Take 1-2 tablets (5-10 mg total) by mouth every 6 (six) hours as needed for severe pain. -     Discontinue: oxyCODONE (OXY  IR/ROXICODONE) 5 MG immediate release tablet; Take 1-2 tablets (5-10 mg total) by mouth every 6 (six) hours as needed for severe pain. -     oxyCODONE (OXY IR/ROXICODONE) 5 MG immediate release tablet; Take 1-2 tablets (5-10 mg total) by mouth every 6 (six) hours as needed for severe pain.   I have discontinued Beth [name]'s traMADol, rOPINIRole, predniSONE, cloNIDine, losartan, furosemide, and isosorbide mononitrate. I have also changed her atorvastatin and acetaminophen. Additionally, I am having her maintain her multivitamin with minerals, ONETOUCH DELICA LANCETS 55H, ONE TOUCH ULTRA TEST, Cyanocobalamin (VITAMIN B-12 PO), enoxaparin, GAVILAX, olmesartan, Nebivolol HCl, metFORMIN, labetalol, hydrALAZINE, amLODipine, and oxyCODONE.  Meds ordered this encounter  Medications  . olmesartan (BENICAR) 40 MG  tablet    Sig: TAKE ONE (1) TABLET EACH DAY    Dispense:  90 tablet    Refill:  1  . Nebivolol HCl (BYSTOLIC) 20 MG TABS    Sig: TAKE ONE (1) TABLET EACH DAY    Dispense:  90 each    Refill:  1  . metFORMIN (GLUCOPHAGE-XR) 500 MG 24 hr tablet    Sig: TAKE ONE (1) TABLET EACH DAY    Dispense:  90 tablet    Refill:  1  . labetalol (NORMODYNE) 200 MG tablet    Sig: Take 1 tablet (200 mg total) by mouth 2 (two) times daily.    Dispense:  180 tablet    Refill:  1  . hydrALAZINE (APRESOLINE) 100 MG tablet    Sig: TAKE ONE (1) TABLET THREE (3) TIMES EACH DAY    Dispense:  270 tablet    Refill:  1  . atorvastatin (LIPITOR) 20 MG tablet    Sig: Take 1 tablet (20 mg total) by mouth every evening.    Dispense:  90 tablet    Refill:  1  . amLODipine (NORVASC) 10 MG tablet    Sig: TAKE 1 TABLET DAILY IN THE EVENING    Dispense:  90 tablet    Refill:  1  . acetaminophen (TYLENOL) 500 MG tablet    Sig: Take 2 tablets (1,000 mg total) by mouth every 6 (six) hours as needed for mild pain.    Dispense:  90 tablet    Refill:  1  . DISCONTD: oxyCODONE (OXY IR/ROXICODONE) 5 MG immediate release tablet    Sig: Take 1-2 tablets (5-10 mg total) by mouth every 6 (six) hours as needed for severe pain.    Dispense:  56 tablet    Refill:  0  . DISCONTD: oxyCODONE (OXY IR/ROXICODONE) 5 MG immediate release tablet    Sig: Take 1-2 tablets (5-10 mg total) by mouth every 6 (six) hours as needed for severe pain.    Dispense:  56 tablet    Refill:  0  . oxyCODONE (OXY IR/ROXICODONE) 5 MG immediate release tablet    Sig: Take 1-2 tablets (5-10 mg total) by mouth every 6 (six) hours as needed for severe pain.    Dispense:  56 tablet    Refill:  0     Follow-up: Return in about 3 months (around 12/29/2018).  Claretta Fraise, M.D.

## 2018-09-29 DIAGNOSIS — E1122 Type 2 diabetes mellitus with diabetic chronic kidney disease: Secondary | ICD-10-CM | POA: Diagnosis not present

## 2018-09-29 DIAGNOSIS — N183 Chronic kidney disease, stage 3 (moderate): Secondary | ICD-10-CM | POA: Diagnosis not present

## 2018-09-29 DIAGNOSIS — M10022 Idiopathic gout, left elbow: Secondary | ICD-10-CM | POA: Diagnosis not present

## 2018-09-29 DIAGNOSIS — Z79891 Long term (current) use of opiate analgesic: Secondary | ICD-10-CM | POA: Diagnosis not present

## 2018-09-29 DIAGNOSIS — Z8673 Personal history of transient ischemic attack (TIA), and cerebral infarction without residual deficits: Secondary | ICD-10-CM | POA: Diagnosis not present

## 2018-09-29 DIAGNOSIS — Z9181 History of falling: Secondary | ICD-10-CM | POA: Diagnosis not present

## 2018-09-29 DIAGNOSIS — Z7984 Long term (current) use of oral hypoglycemic drugs: Secondary | ICD-10-CM | POA: Diagnosis not present

## 2018-09-29 DIAGNOSIS — D631 Anemia in chronic kidney disease: Secondary | ICD-10-CM | POA: Diagnosis not present

## 2018-09-29 DIAGNOSIS — I129 Hypertensive chronic kidney disease with stage 1 through stage 4 chronic kidney disease, or unspecified chronic kidney disease: Secondary | ICD-10-CM | POA: Diagnosis not present

## 2018-09-29 DIAGNOSIS — T8453XD Infection and inflammatory reaction due to internal right knee prosthesis, subsequent encounter: Secondary | ICD-10-CM | POA: Diagnosis not present

## 2018-09-29 DIAGNOSIS — E785 Hyperlipidemia, unspecified: Secondary | ICD-10-CM | POA: Diagnosis not present

## 2018-09-29 DIAGNOSIS — H259 Unspecified age-related cataract: Secondary | ICD-10-CM | POA: Diagnosis not present

## 2018-09-29 DIAGNOSIS — M170 Bilateral primary osteoarthritis of knee: Secondary | ICD-10-CM | POA: Diagnosis not present

## 2018-09-29 LAB — CMP14+EGFR
ALBUMIN: 4 g/dL (ref 3.5–4.8)
ALT: 7 IU/L (ref 0–32)
AST: 12 IU/L (ref 0–40)
Albumin/Globulin Ratio: 1.4 (ref 1.2–2.2)
Alkaline Phosphatase: 94 IU/L (ref 39–117)
BILIRUBIN TOTAL: 0.4 mg/dL (ref 0.0–1.2)
BUN / CREAT RATIO: 21 (ref 12–28)
BUN: 25 mg/dL (ref 8–27)
CHLORIDE: 100 mmol/L (ref 96–106)
CO2: 24 mmol/L (ref 20–29)
CREATININE: 1.19 mg/dL — AB (ref 0.57–1.00)
Calcium: 9.4 mg/dL (ref 8.7–10.3)
GFR calc non Af Amer: 45 mL/min/{1.73_m2} — ABNORMAL LOW (ref >59)
GFR, EST AFRICAN AMERICAN: 52 mL/min/{1.73_m2} — AB (ref >59)
GLUCOSE: 88 mg/dL (ref 65–99)
Globulin, Total: 2.9 g/dL (ref 1.5–4.5)
Potassium: 4.4 mmol/L (ref 3.5–5.2)
Sodium: 139 mmol/L (ref 134–144)
TOTAL PROTEIN: 6.9 g/dL (ref 6.0–8.5)

## 2018-09-29 LAB — LIPID PANEL
CHOL/HDL RATIO: 2.5 ratio (ref 0.0–4.4)
Cholesterol, Total: 145 mg/dL (ref 100–199)
HDL: 59 mg/dL (ref >39)
LDL Calculated: 59 mg/dL (ref 0–99)
Triglycerides: 134 mg/dL (ref 0–149)
VLDL CHOLESTEROL CAL: 27 mg/dL (ref 5–40)

## 2018-09-29 LAB — CBC WITH DIFFERENTIAL/PLATELET
BASOS ABS: 0.1 10*3/uL (ref 0.0–0.2)
BASOS: 1 %
EOS (ABSOLUTE): 0.2 10*3/uL (ref 0.0–0.4)
EOS: 3 %
HEMOGLOBIN: 10.7 g/dL — AB (ref 11.1–15.9)
Hematocrit: 34.3 % (ref 34.0–46.6)
IMMATURE GRANS (ABS): 0 10*3/uL (ref 0.0–0.1)
Immature Granulocytes: 0 %
Lymphocytes Absolute: 1.3 10*3/uL (ref 0.7–3.1)
Lymphs: 20 %
MCH: 24.9 pg — AB (ref 26.6–33.0)
MCHC: 31.2 g/dL — ABNORMAL LOW (ref 31.5–35.7)
MCV: 80 fL (ref 79–97)
Monocytes Absolute: 0.7 10*3/uL (ref 0.1–0.9)
Monocytes: 10 %
Neutrophils Absolute: 4.3 10*3/uL (ref 1.4–7.0)
Neutrophils: 66 %
Platelets: 385 10*3/uL (ref 150–450)
RBC: 4.29 x10E6/uL (ref 3.77–5.28)
RDW: 15.1 % (ref 12.3–15.4)
WBC: 6.5 10*3/uL (ref 3.4–10.8)

## 2018-09-29 LAB — MICROALBUMIN / CREATININE URINE RATIO
CREATININE, UR: 262.9 mg/dL
MICROALBUM., U, RANDOM: 426.5 ug/mL
Microalb/Creat Ratio: 162.2 mg/g creat — ABNORMAL HIGH (ref 0.0–30.0)

## 2018-09-29 NOTE — Progress Notes (Signed)
Hello Aurilla,  Your lab result is normal.Some minor variations that are not significant are commonly marked abnormal, but do not represent any medical problem for you.  Best regards, Nneka Blanda, M.D.

## 2018-09-30 DIAGNOSIS — L84 Corns and callosities: Secondary | ICD-10-CM | POA: Diagnosis not present

## 2018-09-30 DIAGNOSIS — B351 Tinea unguium: Secondary | ICD-10-CM | POA: Diagnosis not present

## 2018-09-30 DIAGNOSIS — M79676 Pain in unspecified toe(s): Secondary | ICD-10-CM | POA: Diagnosis not present

## 2018-09-30 DIAGNOSIS — E1142 Type 2 diabetes mellitus with diabetic polyneuropathy: Secondary | ICD-10-CM | POA: Diagnosis not present

## 2018-10-04 ENCOUNTER — Encounter: Payer: Self-pay | Admitting: Family Medicine

## 2018-10-06 DIAGNOSIS — E785 Hyperlipidemia, unspecified: Secondary | ICD-10-CM | POA: Diagnosis not present

## 2018-10-06 DIAGNOSIS — Z79891 Long term (current) use of opiate analgesic: Secondary | ICD-10-CM | POA: Diagnosis not present

## 2018-10-06 DIAGNOSIS — Z8673 Personal history of transient ischemic attack (TIA), and cerebral infarction without residual deficits: Secondary | ICD-10-CM | POA: Diagnosis not present

## 2018-10-06 DIAGNOSIS — M10022 Idiopathic gout, left elbow: Secondary | ICD-10-CM | POA: Diagnosis not present

## 2018-10-06 DIAGNOSIS — N183 Chronic kidney disease, stage 3 (moderate): Secondary | ICD-10-CM | POA: Diagnosis not present

## 2018-10-06 DIAGNOSIS — D631 Anemia in chronic kidney disease: Secondary | ICD-10-CM | POA: Diagnosis not present

## 2018-10-06 DIAGNOSIS — H259 Unspecified age-related cataract: Secondary | ICD-10-CM | POA: Diagnosis not present

## 2018-10-06 DIAGNOSIS — T8453XD Infection and inflammatory reaction due to internal right knee prosthesis, subsequent encounter: Secondary | ICD-10-CM | POA: Diagnosis not present

## 2018-10-06 DIAGNOSIS — Z9181 History of falling: Secondary | ICD-10-CM | POA: Diagnosis not present

## 2018-10-06 DIAGNOSIS — I129 Hypertensive chronic kidney disease with stage 1 through stage 4 chronic kidney disease, or unspecified chronic kidney disease: Secondary | ICD-10-CM | POA: Diagnosis not present

## 2018-10-06 DIAGNOSIS — E1122 Type 2 diabetes mellitus with diabetic chronic kidney disease: Secondary | ICD-10-CM | POA: Diagnosis not present

## 2018-10-06 DIAGNOSIS — M170 Bilateral primary osteoarthritis of knee: Secondary | ICD-10-CM | POA: Diagnosis not present

## 2018-10-06 DIAGNOSIS — Z7984 Long term (current) use of oral hypoglycemic drugs: Secondary | ICD-10-CM | POA: Diagnosis not present

## 2018-10-10 DIAGNOSIS — Z4789 Encounter for other orthopedic aftercare: Secondary | ICD-10-CM | POA: Diagnosis not present

## 2018-10-13 DIAGNOSIS — Z8673 Personal history of transient ischemic attack (TIA), and cerebral infarction without residual deficits: Secondary | ICD-10-CM | POA: Diagnosis not present

## 2018-10-13 DIAGNOSIS — N183 Chronic kidney disease, stage 3 (moderate): Secondary | ICD-10-CM | POA: Diagnosis not present

## 2018-10-13 DIAGNOSIS — D631 Anemia in chronic kidney disease: Secondary | ICD-10-CM | POA: Diagnosis not present

## 2018-10-13 DIAGNOSIS — E1122 Type 2 diabetes mellitus with diabetic chronic kidney disease: Secondary | ICD-10-CM | POA: Diagnosis not present

## 2018-10-13 DIAGNOSIS — I129 Hypertensive chronic kidney disease with stage 1 through stage 4 chronic kidney disease, or unspecified chronic kidney disease: Secondary | ICD-10-CM | POA: Diagnosis not present

## 2018-10-13 DIAGNOSIS — H259 Unspecified age-related cataract: Secondary | ICD-10-CM | POA: Diagnosis not present

## 2018-10-13 DIAGNOSIS — Z7984 Long term (current) use of oral hypoglycemic drugs: Secondary | ICD-10-CM | POA: Diagnosis not present

## 2018-10-13 DIAGNOSIS — E785 Hyperlipidemia, unspecified: Secondary | ICD-10-CM | POA: Diagnosis not present

## 2018-10-13 DIAGNOSIS — M10022 Idiopathic gout, left elbow: Secondary | ICD-10-CM | POA: Diagnosis not present

## 2018-10-13 DIAGNOSIS — Z79891 Long term (current) use of opiate analgesic: Secondary | ICD-10-CM | POA: Diagnosis not present

## 2018-10-13 DIAGNOSIS — M170 Bilateral primary osteoarthritis of knee: Secondary | ICD-10-CM | POA: Diagnosis not present

## 2018-10-13 DIAGNOSIS — T8453XD Infection and inflammatory reaction due to internal right knee prosthesis, subsequent encounter: Secondary | ICD-10-CM | POA: Diagnosis not present

## 2018-10-13 DIAGNOSIS — Z9181 History of falling: Secondary | ICD-10-CM | POA: Diagnosis not present

## 2018-10-20 DIAGNOSIS — D631 Anemia in chronic kidney disease: Secondary | ICD-10-CM | POA: Diagnosis not present

## 2018-10-20 DIAGNOSIS — M10022 Idiopathic gout, left elbow: Secondary | ICD-10-CM | POA: Diagnosis not present

## 2018-10-20 DIAGNOSIS — E1122 Type 2 diabetes mellitus with diabetic chronic kidney disease: Secondary | ICD-10-CM | POA: Diagnosis not present

## 2018-10-20 DIAGNOSIS — E785 Hyperlipidemia, unspecified: Secondary | ICD-10-CM | POA: Diagnosis not present

## 2018-10-20 DIAGNOSIS — N183 Chronic kidney disease, stage 3 (moderate): Secondary | ICD-10-CM | POA: Diagnosis not present

## 2018-10-20 DIAGNOSIS — I129 Hypertensive chronic kidney disease with stage 1 through stage 4 chronic kidney disease, or unspecified chronic kidney disease: Secondary | ICD-10-CM | POA: Diagnosis not present

## 2018-10-20 DIAGNOSIS — T8453XD Infection and inflammatory reaction due to internal right knee prosthesis, subsequent encounter: Secondary | ICD-10-CM | POA: Diagnosis not present

## 2018-10-20 DIAGNOSIS — M170 Bilateral primary osteoarthritis of knee: Secondary | ICD-10-CM | POA: Diagnosis not present

## 2018-10-20 DIAGNOSIS — Z7984 Long term (current) use of oral hypoglycemic drugs: Secondary | ICD-10-CM | POA: Diagnosis not present

## 2018-10-20 DIAGNOSIS — Z8673 Personal history of transient ischemic attack (TIA), and cerebral infarction without residual deficits: Secondary | ICD-10-CM | POA: Diagnosis not present

## 2018-10-20 DIAGNOSIS — H259 Unspecified age-related cataract: Secondary | ICD-10-CM | POA: Diagnosis not present

## 2018-10-20 DIAGNOSIS — Z79891 Long term (current) use of opiate analgesic: Secondary | ICD-10-CM | POA: Diagnosis not present

## 2018-10-20 DIAGNOSIS — Z9181 History of falling: Secondary | ICD-10-CM | POA: Diagnosis not present

## 2018-10-22 NOTE — Patient Instructions (Signed)
Beth [name]  [birth year]    Your procedure is scheduled on:  11-03-2018   Report to [facility] Main  Entrance, Report to admitting at  11:45 AM    Call this number if you have problems the morning of surgery [phone]       Remember: Do not eat food After Midnight.  Have clear liquid diet from midnight until 8:15 AM day of surgery.   After 8:15 AM nothing by mouth including water, candy, gum, mints.                                       BRUSH YOUR TEETH MORNING OF SURGERY AND RINSE YOUR MOUTH OUT         Take these medicines the morning of surgery with A SIP OF WATER:   Bystolic  DO NOT TAKE ANY DIABETIC MEDICATIONS DAY OF YOUR SURGERY                                  You may not have any metal on your body including hair pins and piercings              Do not wear jewelry, make-up, lotions, powders or perfumes, deodorant              Do not wear nail polish.  Do not shave  48 hours prior to surgery.         Do not bring valuables to the hospital. [place].  Contacts, dentures or bridgework may not be worn into surgery.             Leave suitcase and any belongings in car, have brought to hospital room later.               __________________________________________________________________________    CLEAR LIQUID DIET   Foods Allowed                                                                     Foods Excluded  Coffee and tea, regular and decaf                             liquids that you cannot  Plain Jell-O in any flavor                                             see through such as: Fruit ices (not with fruit pulp)                                     milk, soups, orange juice  Iced Popsicles  All solid food Carbonated beverages, regular and diet                                    Cranberry, grape and apple juices Sports drinks like  Gatorade Lightly seasoned clear broth or consume(fat free) Sugar, honey syrup  Sample Menu Breakfast                                Lunch                                     Supper Cranberry juice                    Beef broth                            Chicken broth Jell-O                                     Grape juice                           Apple juice Coffee or tea                        Jell-O                                      Popsicle                                                Coffee or tea                        Coffee or tea  _____________________________________________________________________             [facility] - Preparing for Surgery Before surgery, you can play an important role.  Because skin is not sterile, your skin needs to be as free of germs as possible.  You can reduce the number of germs on your skin by washing with CHG (chlorahexidine gluconate) soap before surgery.  CHG is an antiseptic cleaner which kills germs and bonds with the skin to continue killing germs even after washing. Please DO NOT use if you have an allergy to CHG or antibacterial soaps.  If your skin becomes reddened/irritated stop using the CHG and inform your nurse when you arrive at Short Stay. Do not shave (including legs and underarms) for at least 48 hours prior to the first CHG shower.  You may shave your face/neck. Please follow these instructions carefully:  1.  Shower with CHG Soap the night before surgery and the  morning of Surgery.  2.  If you choose to wash your hair, wash your hair first as usual with your  normal  shampoo.  3.  After you shampoo, rinse your hair and body thoroughly to remove the  shampoo.                            4.  Use CHG as you would any other liquid soap.  You can apply chg directly  to the skin and wash                       Gently with a scrungie or clean washcloth.  5.  Apply the CHG Soap to your body ONLY FROM THE NECK DOWN.   Do not use on face/  open                           Wound or open sores. Avoid contact with eyes, ears mouth and genitals (private parts).                       Wash face,  Genitals (private parts) with your normal soap.             6.  Wash thoroughly, paying special attention to the area where your surgery  will be performed.  7.  Thoroughly rinse your body with warm water from the neck down.  8.  DO NOT shower/wash with your normal soap after using and rinsing off  the CHG Soap.             9.  Pat yourself dry with a clean towel.            10.  Wear clean pajamas.            11.  Place clean sheets on your bed the night of your first shower and do not  sleep with pets. Day of Surgery : Do not apply any lotions/deodorants the morning of surgery.  Please wear clean clothes to the hospital/surgery center.  FAILURE TO FOLLOW THESE INSTRUCTIONS MAY RESULT IN THE CANCELLATION OF YOUR SURGERY PATIENT SIGNATURE_________________________________  NURSE SIGNATURE__________________________________  ________________________________________________________________________   [name]  An incentive spirometer is a tool that can help keep your lungs clear and active. This tool measures how well you are filling your lungs with each breath. Taking long deep breaths may help reverse or decrease the chance of developing breathing (pulmonary) problems (especially infection) following:  A long period of time when you are unable to move or be active. BEFORE THE PROCEDURE   If the spirometer includes an indicator to show your best effort, your nurse or respiratory therapist will set it to a desired goal.  If possible, sit up straight or lean slightly forward. Try not to slouch.  Hold the incentive spirometer in an upright position. INSTRUCTIONS FOR USE  1. Sit on the edge of your bed if possible, or sit up as far as you can in bed or on a chair. 2. Hold the incentive spirometer in an upright  position. 3. Breathe out normally. 4. Place the mouthpiece in your mouth and seal your lips tightly around it. 5. Breathe in slowly and as deeply as possible, raising the piston or the ball toward the top of the column. 6. Hold your breath for 3-5 seconds or for as long as possible. Allow the piston or ball to fall to the bottom of the column. 7. Remove the mouthpiece from your mouth and breathe out normally. 8. Rest for a few seconds and repeat Steps 1 through 7 at least 10 times every 1-2  hours when you are awake. Take your time and take a few normal breaths between deep breaths. 9. The spirometer may include an indicator to show your best effort. Use the indicator as a goal to work toward during each repetition. 10. After each set of 10 deep breaths, practice coughing to be sure your lungs are clear. If you have an incision (the cut made at the time of surgery), support your incision when coughing by placing a pillow or rolled up towels firmly against it. Once you are able to get out of bed, walk around indoors and cough well. You may stop using the incentive spirometer when instructed by your caregiver.  RISKS AND COMPLICATIONS  Take your time so you do not get dizzy or light-headed.  If you are in pain, you may need to take or ask for pain medication before doing incentive spirometry. It is harder to take a deep breath if you are having pain. AFTER USE  Rest and breathe slowly and easily.  It can be helpful to keep track of a log of your progress. Your caregiver can provide you with a simple table to help with this. If you are using the spirometer at home, follow these instructions: [place] IF:   You are having difficultly using the spirometer.  You have trouble using the spirometer as often as instructed.  Your pain medication is not giving enough relief while using the spirometer.  You develop fever of 100.5 F (38.1 C) or higher. SEEK IMMEDIATE MEDICAL CARE IF:    You cough up bloody sputum that had not been present before.  You develop fever of 102 F (38.9 C) or greater.  You develop worsening pain at or near the incision site. MAKE SURE YOU:   Understand these instructions.  Will watch your condition.  Will get help right away if you are not doing well or get worse. Document Released: 04/06/2007 Document Revised: 02/16/2012 Document Reviewed: 06/07/2007 Exit[employer] Patient Information 2014 [employer], [place].   ________________________________________________________________________  WHAT IS A BLOOD TRANSFUSION? Blood Transfusion Information  A transfusion is the replacement of blood or some of its parts. Blood is made up of multiple cells which provide different functions.  Red blood cells carry oxygen and are used for blood loss replacement.  White blood cells fight against infection.  Platelets control bleeding.  Plasma helps clot blood.  Other blood products are available for specialized needs, such as hemophilia or other clotting disorders. BEFORE THE TRANSFUSION  Who gives blood for transfusions?   Healthy volunteers who are fully evaluated to make sure their blood is safe. This is blood bank blood. Transfusion therapy is the safest it has ever been in the practice of medicine. Before blood is taken from a donor, a complete history is taken to make sure that person has no history of diseases nor engages in risky social behavior (examples are intravenous drug use or sexual activity with multiple partners). The donor's travel history is screened to minimize risk of transmitting infections, such as malaria. The donated blood is tested for signs of infectious diseases, such as HIV and hepatitis. The blood is then tested to be sure it is compatible with you in order to minimize the chance of a transfusion reaction. If you or a relative donates blood, this is often done in anticipation of surgery and is not appropriate for emergency  situations. It takes many days to process the donated blood. RISKS AND COMPLICATIONS Although transfusion therapy is very safe and  saves many lives, the main dangers of transfusion include:   Getting an infectious disease.  Developing a transfusion reaction. This is an allergic reaction to something in the blood you were given. Every precaution is taken to prevent this. The decision to have a blood transfusion has been considered carefully by your caregiver before blood is given. Blood is not given unless the benefits outweigh the risks. AFTER THE TRANSFUSION  Right after receiving a blood transfusion, you will usually feel much better and more energetic. This is especially true if your red blood cells have gotten low (anemic). The transfusion raises the level of the red blood cells which carry oxygen, and this usually causes an energy increase.  The nurse administering the transfusion will monitor you carefully for complications. HOME CARE INSTRUCTIONS  No special instructions are needed after a transfusion. You may find your energy is better. Speak with your caregiver about any limitations on activity for underlying diseases you may have. SEEK MEDICAL CARE IF:   Your condition is not improving after your transfusion.  You develop redness or irritation at the intravenous (IV) site. SEEK IMMEDIATE MEDICAL CARE IF:  Any of the following symptoms occur over the next 12 hours:  Shaking chills.  You have a temperature by mouth above 102 F (38.9 C), not controlled by medicine.  Chest, back, or muscle pain.  People around you feel you are not acting correctly or are confused.  Shortness of breath or difficulty breathing.  Dizziness and fainting.  You get a rash or develop hives.  You have a decrease in urine output.  Your urine turns a dark color or changes to pink, red, or brown. Any of the following symptoms occur over the next 10 days:  You have a temperature by mouth above  102 F (38.9 C), not controlled by medicine.  Shortness of breath.  Weakness after normal activity.  The white part of the eye turns yellow (jaundice).  You have a decrease in the amount of urine or are urinating less often.  Your urine turns a dark color or changes to pink, red, or brown. Document Released: 11/21/2000 Document Revised: 02/16/2012 Document Reviewed: 07/10/2008 [facility] Patient Information 2014 [place], [place].  _______________________________________________________________________

## 2018-10-26 ENCOUNTER — Inpatient Hospital Stay (HOSPITAL_COMMUNITY)
Admission: RE | Admit: 2018-10-26 | Discharge: 2018-10-26 | Disposition: A | Discharge status: Home / Self Care | Payer: Medicare Other | Source: Ambulatory Visit

## 2018-10-26 NOTE — Patient Instructions (Addendum)
Beth [name]  10/26/2018   Your procedure is scheduled on:11/03/2018      Report to [facility] Main  Entrance    Report to admitting at  11:45 AM    Call this number if you have problems the morning of surgery [phone]    Remember: Do not eat food :After Midnight. You may have clear liquids from 12 midnite until 0800am morning of surgery then nothing by mouth.       Take these medicines the morning of surgery with A SIP OF WATER: Nebivolol ( Bystolic),  (Apresoline) Hydralazine,    BRUSH YOUR TEETH MORNING OF SURGERY AND RINSE YOUR MOUTH OUT, NO CHEWING GUM CANDY OR MINTS.    DO NOT TAKE ANY DIABETIC MEDICATIONS DAY OF YOUR SURGERY                               You may not have any metal on your body including hair pins and              piercings  Do not wear jewelry, make-up, lotions, powders or perfumes, deodorant             Do not wear nail polish.  Do not shave  48 hours prior to surgery.                Do not bring valuables to the hospital. [place].  Contacts, dentures or bridgework may not be worn into surgery.  Leave suitcase in the car. After surgery it may be brought to your room.               Please read over the following fact sheets you were given:       [place] Allowed                                                                     Foods Excluded  Coffee and tea, regular and decaf                             liquids that you cannot  Plain Jell-O in any flavor                                             see through such as: Fruit ices (not with fruit pulp)                                     milk, soups, orange juice  Iced Popsicles                                    All solid food Carbonated  beverages, regular and diet                                    Cranberry, grape and apple juices Sports drinks like Gatorade Lightly seasoned clear broth or  consume(fat free) Sugar, honey syrup  Sample Menu Breakfast                                Lunch                                     Supper Cranberry juice                    Beef broth                            Chicken broth Jell-O                                     Grape juice                           Apple juice Coffee or tea                        Jell-O                                      Popsicle                                                Coffee or tea                        Coffee or tea  _____________________________________________________________________  How to Manage Your Diabetes Before and After Surgery  Why is it important to control my blood sugar before and after surgery? . Improving blood sugar levels before and after surgery helps healing and can limit problems. . A way of improving blood sugar control is eating a healthy diet by: o  Eating less sugar and carbohydrates o  Increasing activity/exercise o  Talking with your doctor about reaching your blood sugar goals . High blood sugars (greater than 180 mg/dL) can raise your risk of infections and slow your recovery, so you will need to focus on controlling your diabetes during the weeks before surgery. . Make sure that the doctor who takes care of your diabetes knows about your planned surgery including the date and location.  How do I manage my blood sugar before surgery? . Check your blood sugar at least 4 times a day, starting 2 days before surgery, to make sure that the level is not too high or low. o Check your blood sugar the morning of your surgery when you wake up and every 2 hours until you get to the Short Stay unit. . If your blood sugar is less than 70 mg/dL, you  will need to treat for low blood sugar: o Do not take insulin. o Treat a low blood sugar (less than 70 mg/dL) with  cup of clear juice (cranberry or apple), 4 glucose tablets, OR glucose gel. o Recheck blood sugar in 15 minutes after  treatment (to make sure it is greater than 70 mg/dL). If your blood sugar is not greater than 70 mg/dL on recheck, call [phone] for further instructions. . Report your blood sugar to the short stay nurse when you get to Short Stay.  . If you are admitted to the hospital after surgery: o Your blood sugar will be checked by the staff and you will probably be given insulin after surgery (instead of oral diabetes medicines) to make sure you have good blood sugar levels. o The goal for blood sugar control after surgery is 80-180 mg/dL.   WHAT DO I DO ABOUT MY DIABETES MEDICATION?  Marland Kitchen Do not take oral diabetes medicines (pills) the morning of surgery.  . THE DAY BEFORE SURGERY, take your usual dose of Metformin           [place] - Preparing for Surgery Before surgery, you can play an important role.  Because skin is not sterile, your skin needs to be as free of germs as possible.  You can reduce the number of germs on your skin by washing with CHG (chlorahexidine gluconate) soap before surgery.  CHG is an antiseptic cleaner which kills germs and bonds with the skin to continue killing germs even after washing. Please DO NOT use if you have an allergy to CHG or antibacterial soaps.  If your skin becomes reddened/irritated stop using the CHG and inform your nurse when you arrive at Short Stay. Do not shave (including legs and underarms) for at least 48 hours prior to the first CHG shower.  You may shave your face/neck. Please follow these instructions carefully:  1.  Shower with CHG Soap the night before surgery and the  morning of Surgery.  2.  If you choose to wash your hair, wash your hair first as usual with your  normal  shampoo.  3.  After you shampoo, rinse your hair and body thoroughly to remove the  shampoo.                           4.  Use CHG as you would any other liquid soap.  You can apply chg directly  to the skin and wash                       Gently with a scrungie or clean  washcloth.  5.  Apply the CHG Soap to your body ONLY FROM THE NECK DOWN.   Do not use on face/ open                           Wound or open sores. Avoid contact with eyes, ears mouth and genitals (private parts).                       Wash face,  Genitals (private parts) with your normal soap.             6.  Wash thoroughly, paying special attention to the area where your surgery  will be performed.  7.  Thoroughly rinse your body with warm water from the neck down.  8.  DO NOT shower/wash with your normal soap after using and rinsing off  the CHG Soap.                9.  Pat yourself dry with a clean towel.            10.  Wear clean pajamas.            11.  Place clean sheets on your bed the night of your first shower and do not  sleep with pets. Day of Surgery : Do not apply any lotions/deodorants the morning of surgery.  Please wear clean clothes to the hospital/surgery center.  FAILURE TO FOLLOW THESE INSTRUCTIONS MAY RESULT IN THE CANCELLATION OF YOUR SURGERY PATIENT SIGNATURE_________________________________  NURSE SIGNATURE__________________________________  ________________________________________________________________________   [name]  An incentive spirometer is a tool that can help keep your lungs clear and active. This tool measures how well you are filling your lungs with each breath. Taking long deep breaths may help reverse or decrease the chance of developing breathing (pulmonary) problems (especially infection) following:  A long period of time when you are unable to move or be active. BEFORE THE PROCEDURE   If the spirometer includes an indicator to show your best effort, your nurse or respiratory therapist will set it to a desired goal.  If possible, sit up straight or lean slightly forward. Try not to slouch.  Hold the incentive spirometer in an upright position. INSTRUCTIONS FOR USE  1. Sit on the edge of your bed if possible, or sit up as far  as you can in bed or on a chair. 2. Hold the incentive spirometer in an upright position. 3. Breathe out normally. 4. Place the mouthpiece in your mouth and seal your lips tightly around it. 5. Breathe in slowly and as deeply as possible, raising the piston or the ball toward the top of the column. 6. Hold your breath for 3-5 seconds or for as long as possible. Allow the piston or ball to fall to the bottom of the column. 7. Remove the mouthpiece from your mouth and breathe out normally. 8. Rest for a few seconds and repeat Steps 1 through 7 at least 10 times every 1-2 hours when you are awake. Take your time and take a few normal breaths between deep breaths. 9. The spirometer may include an indicator to show your best effort. Use the indicator as a goal to work toward during each repetition. 10. After each set of 10 deep breaths, practice coughing to be sure your lungs are clear. If you have an incision (the cut made at the time of surgery), support your incision when coughing by placing a pillow or rolled up towels firmly against it. Once you are able to get out of bed, walk around indoors and cough well. You may stop using the incentive spirometer when instructed by your caregiver.  RISKS AND COMPLICATIONS  Take your time so you do not get dizzy or light-headed.  If you are in pain, you may need to take or ask for pain medication before doing incentive spirometry. It is harder to take a deep breath if you are having pain. AFTER USE  Rest and breathe slowly and easily.  It can be helpful to keep track of a log of your progress. Your caregiver can provide you with a simple table to help with this. If you are using the spirometer at home, follow these instructions: [place] IF:   You  are having difficultly using the spirometer.  You have trouble using the spirometer as often as instructed.  Your pain medication is not giving enough relief while using the spirometer.  You  develop fever of 100.5 F (38.1 C) or higher. SEEK IMMEDIATE MEDICAL CARE IF:   You cough up bloody sputum that had not been present before.  You develop fever of 102 F (38.9 C) or greater.  You develop worsening pain at or near the incision site. MAKE SURE YOU:   Understand these instructions.  Will watch your condition.  Will get help right away if you are not doing well or get worse. Document Released: 04/06/2007 Document Revised: 02/16/2012 Document Reviewed: 06/07/2007 Exit[employer] Patient Information 2014 [employer], [place].   ________________________________________________________________________  WHAT IS A BLOOD TRANSFUSION? Blood Transfusion Information  A transfusion is the replacement of blood or some of its parts. Blood is made up of multiple cells which provide different functions.  Red blood cells carry oxygen and are used for blood loss replacement.  White blood cells fight against infection.  Platelets control bleeding.  Plasma helps clot blood.  Other blood products are available for specialized needs, such as hemophilia or other clotting disorders. BEFORE THE TRANSFUSION  Who gives blood for transfusions?   Healthy volunteers who are fully evaluated to make sure their blood is safe. This is blood bank blood. Transfusion therapy is the safest it has ever been in the practice of medicine. Before blood is taken from a donor, a complete history is taken to make sure that person has no history of diseases nor engages in risky social behavior (examples are intravenous drug use or sexual activity with multiple partners). The donor's travel history is screened to minimize risk of transmitting infections, such as malaria. The donated blood is tested for signs of infectious diseases, such as HIV and hepatitis. The blood is then tested to be sure it is compatible with you in order to minimize the chance of a transfusion reaction. If you or a relative donates blood, this is  often done in anticipation of surgery and is not appropriate for emergency situations. It takes many days to process the donated blood. RISKS AND COMPLICATIONS Although transfusion therapy is very safe and saves many lives, the main dangers of transfusion include:   Getting an infectious disease.  Developing a transfusion reaction. This is an allergic reaction to something in the blood you were given. Every precaution is taken to prevent this. The decision to have a blood transfusion has been considered carefully by your caregiver before blood is given. Blood is not given unless the benefits outweigh the risks. AFTER THE TRANSFUSION  Right after receiving a blood transfusion, you will usually feel much better and more energetic. This is especially true if your red blood cells have gotten low (anemic). The transfusion raises the level of the red blood cells which carry oxygen, and this usually causes an energy increase.  The nurse administering the transfusion will monitor you carefully for complications. HOME CARE INSTRUCTIONS  No special instructions are needed after a transfusion. You may find your energy is better. Speak with your caregiver about any limitations on activity for underlying diseases you may have. SEEK MEDICAL CARE IF:   Your condition is not improving after your transfusion.  You develop redness or irritation at the intravenous (IV) site. SEEK IMMEDIATE MEDICAL CARE IF:  Any of the following symptoms occur over the next 12 hours:  Shaking chills.  You have a temperature by  mouth above 102 F (38.9 C), not controlled by medicine.  Chest, back, or muscle pain.  People around you feel you are not acting correctly or are confused.  Shortness of breath or difficulty breathing.  Dizziness and fainting.  You get a rash or develop hives.  You have a decrease in urine output.  Your urine turns a dark color or changes to pink, red, or brown. Any of the following  symptoms occur over the next 10 days:  You have a temperature by mouth above 102 F (38.9 C), not controlled by medicine.  Shortness of breath.  Weakness after normal activity.  The white part of the eye turns yellow (jaundice).  You have a decrease in the amount of urine or are urinating less often.  Your urine turns a dark color or changes to pink, red, or brown. Document Released: 11/21/2000 Document Revised: 02/16/2012 Document Reviewed: 07/10/2008 [facility] Patient Information 2014 [place], [place].  _______________________________________________________________________

## 2018-10-27 ENCOUNTER — Encounter (HOSPITAL_COMMUNITY)
Admission: RE | Admit: 2018-10-27 | Discharge: 2018-10-27 | Disposition: A | Discharge status: Home / Self Care | Payer: Medicare Other | Source: Ambulatory Visit | Attending: Orthopedic Surgery | Admitting: Orthopedic Surgery

## 2018-10-27 ENCOUNTER — Other Ambulatory Visit: Payer: Self-pay

## 2018-10-27 ENCOUNTER — Encounter (HOSPITAL_COMMUNITY): Payer: Self-pay

## 2018-10-27 DIAGNOSIS — T8453XA Infection and inflammatory reaction due to internal right knee prosthesis, initial encounter: Secondary | ICD-10-CM | POA: Insufficient documentation

## 2018-10-27 DIAGNOSIS — R9431 Abnormal electrocardiogram [ECG] [EKG]: Secondary | ICD-10-CM | POA: Diagnosis not present

## 2018-10-27 DIAGNOSIS — Y838 Other surgical procedures as the cause of abnormal reaction of the patient, or of later complication, without mention of misadventure at the time of the procedure: Secondary | ICD-10-CM | POA: Diagnosis not present

## 2018-10-27 DIAGNOSIS — Z01818 Encounter for other preprocedural examination: Secondary | ICD-10-CM | POA: Insufficient documentation

## 2018-10-27 DIAGNOSIS — I517 Cardiomegaly: Secondary | ICD-10-CM | POA: Diagnosis not present

## 2018-10-27 LAB — PROTIME-INR
INR: 0.97
Prothrombin Time: 12.8 s (ref 11.4–15.2)

## 2018-10-27 LAB — CBC
HCT: 40.5 % (ref 36.0–46.0)
HEMOGLOBIN: 11.9 g/dL — AB (ref 12.0–15.0)
MCH: 25.1 pg — AB (ref 26.0–34.0)
MCHC: 29.4 g/dL — AB (ref 30.0–36.0)
MCV: 85.4 fL (ref 80.0–100.0)
PLATELETS: 345 10*3/uL (ref 150–400)
RBC: 4.74 MIL/uL (ref 3.87–5.11)
RDW: 15.8 % — ABNORMAL HIGH (ref 11.5–15.5)
WBC: 7.4 10*3/uL (ref 4.0–10.5)
nRBC: 0 % (ref 0.0–0.2)

## 2018-10-27 LAB — COMPREHENSIVE METABOLIC PANEL
ALT: 11 U/L (ref 0–44)
ANION GAP: 10 (ref 5–15)
AST: 17 U/L (ref 15–41)
Albumin: 4 g/dL (ref 3.5–5.0)
Alkaline Phosphatase: 95 U/L (ref 38–126)
BUN: 19 mg/dL (ref 8–23)
CALCIUM: 9.2 mg/dL (ref 8.9–10.3)
CHLORIDE: 106 mmol/L (ref 98–111)
CO2: 26 mmol/L (ref 22–32)
CREATININE: 0.93 mg/dL (ref 0.44–1.00)
GFR, EST NON AFRICAN AMERICAN: 59 mL/min — AB (ref >60)
Glucose, Bld: 130 mg/dL — ABNORMAL HIGH (ref 70–99)
Potassium: 4.3 mmol/L (ref 3.5–5.1)
Sodium: 142 mmol/L (ref 135–145)
Total Bilirubin: 1 mg/dL (ref 0.3–1.2)
Total Protein: 8.1 g/dL (ref 6.5–8.1)

## 2018-10-27 LAB — GLUCOSE, CAPILLARY: GLUCOSE-CAPILLARY: 88 mg/dL (ref 70–99)

## 2018-10-27 LAB — SURGICAL PCR SCREEN
MRSA, PCR: NEGATIVE
Staphylococcus aureus: NEGATIVE

## 2018-10-27 LAB — APTT: aPTT: 35 s (ref 24–36)

## 2018-10-27 NOTE — Progress Notes (Signed)
09-28-18  (Epic) [id number]

## 2018-10-27 NOTE — Progress Notes (Addendum)
08-27-18 Surgical Clearance from Dr. Livia Snellen on chart

## 2018-10-28 DIAGNOSIS — E1122 Type 2 diabetes mellitus with diabetic chronic kidney disease: Secondary | ICD-10-CM | POA: Diagnosis not present

## 2018-10-28 DIAGNOSIS — E785 Hyperlipidemia, unspecified: Secondary | ICD-10-CM | POA: Diagnosis not present

## 2018-10-28 DIAGNOSIS — D631 Anemia in chronic kidney disease: Secondary | ICD-10-CM | POA: Diagnosis not present

## 2018-10-28 DIAGNOSIS — Z8673 Personal history of transient ischemic attack (TIA), and cerebral infarction without residual deficits: Secondary | ICD-10-CM | POA: Diagnosis not present

## 2018-10-28 DIAGNOSIS — N183 Chronic kidney disease, stage 3 (moderate): Secondary | ICD-10-CM | POA: Diagnosis not present

## 2018-10-28 DIAGNOSIS — H259 Unspecified age-related cataract: Secondary | ICD-10-CM | POA: Diagnosis not present

## 2018-10-28 DIAGNOSIS — T8453XD Infection and inflammatory reaction due to internal right knee prosthesis, subsequent encounter: Secondary | ICD-10-CM | POA: Diagnosis not present

## 2018-10-28 DIAGNOSIS — M170 Bilateral primary osteoarthritis of knee: Secondary | ICD-10-CM | POA: Diagnosis not present

## 2018-10-28 DIAGNOSIS — Z79891 Long term (current) use of opiate analgesic: Secondary | ICD-10-CM | POA: Diagnosis not present

## 2018-10-28 DIAGNOSIS — M10022 Idiopathic gout, left elbow: Secondary | ICD-10-CM | POA: Diagnosis not present

## 2018-10-28 DIAGNOSIS — Z7984 Long term (current) use of oral hypoglycemic drugs: Secondary | ICD-10-CM | POA: Diagnosis not present

## 2018-10-28 DIAGNOSIS — Z9181 History of falling: Secondary | ICD-10-CM | POA: Diagnosis not present

## 2018-10-28 DIAGNOSIS — I129 Hypertensive chronic kidney disease with stage 1 through stage 4 chronic kidney disease, or unspecified chronic kidney disease: Secondary | ICD-10-CM | POA: Diagnosis not present

## 2018-10-28 NOTE — H&P (Signed)
TOTAL KNEE ADMISSION H&P  Patient is being admitted for right total knee arthroplasty resection with antibiotic spacer.  Subjective:  Chief Complaint: Chronic right knee prosthetic joint infection.  HPI: The patient is a 72 year old female who presents for pre-operative visit in preparation for their right total knee arthroplasty resection with antibiotic spacer, which is scheduled on 11/03/18 with Dr. Wynelle Link at [facility]. The patient has had a chronic infection in the right knee with continued pain, swelling, and drainage. She is no longer taking any oral antibiotics at this time. At this time the plan is to procced with a total knee resection with placement of an antibiotic spacer. She will be restarted on antibiotics following this and may proceed with the second stage of the revision once she is clear of infection via MRI.  Patient Active Problem List   Diagnosis Date Noted  . Steroid-induced hyperglycemia   . 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 Cellulitis and abscess of leg 01/14/2017  . Abscess of right leg 01/14/2017  . Diabetes type 2, controlled ([place]) 03/07/2016  . Gout   . Essential hypertension   . Enuresis 11/19/2015  . HLD (hyperlipidemia) 01/04/2014  . Anemia, iron deficiency 12/30/2011  . Constipation 04/04/2009  . TUBULOVILLOUS ADENOMA, COLON 04/03/2009   Past Medical History:  Diagnosis Date  . Anemia   . Arthritis    Knee both knees  . Blood transfusion without reported diagnosis 2012   anemia;pt denies transfusion stated was only on iron tablet  . Cataract    left  . CKD (chronic kidney disease), stage III ([place])   . CVA (cerebral infarction)    2006  . Diabetes mellitus without complication ([place])   . Family history of anesthesia complication    sister very slow to awaken after anesthesia;severe vomiting   . Gout    left elbow  . Heart murmur   . 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 08-09-14 Right eye  .  Hyperlipidemia   . Hypertension   . Nocturia    3-4 times per night  . Stroke [facility]) 2006   x 1 no deficits noted     Past Surgical History:  Procedure Laterality Date  . ABDOMINAL HYSTERECTOMY  1983  . CARPAL TUNNEL RELEASE Right 1983  . colonscopy  June 21, 2014  . EXCISIONAL TOTAL KNEE ARTHROPLASTY WITH ANTIBIOTIC SPACERS Right 02/16/2015   Procedure: RIGHT KNEE RESECTION ARTHROPLASTY WITH ANTIBIOTIC SPACERS;  Surgeon: [name], MD;  Location: [facility];  Service: Orthopedics;  Laterality: Right;  . I&D KNEE WITH POLY EXCHANGE Right 10/02/2014   Procedure: IRRIGATION AND DEBRIDEMENT RIGHT KNEE WITH POLY EXCHANGE;  Surgeon: Gearlean Alf, MD;  Location: [facility];  Service: Orthopedics;  Laterality: Right;  . INCISION AND DRAINAGE OF WOUND Right 01/14/2017   Procedure: IRRIGATION AND DEBRIDEMENT WOUND;  Surgeon: [name], MD;  Location: [facility];  Service: Orthopedics;  Laterality: Right;  requests 59mins  . JOINT REPLACEMENT  06/2014   right knee  . nasal cauterization  2012  . PATELLAR TENDON REPAIR Right 08/11/2014   Procedure: RIGHT PATELLA TENDON REPAIR;  Surgeon: Gearlean Alf, MD;  Location: [facility];  Service: Orthopedics;  Laterality: Right;  . REIMPLANTATION OF TOTAL KNEE Right 05/23/2015   Procedure: RIGHT KNEE ARTHROPLASTY REIMPLANTATION;  Surgeon: [name], MD;  Location: [facility];  Service: Orthopedics;  Laterality: Right;  . TOTAL KNEE ARTHROPLASTY Right 07/03/2014   Procedure: RIGHT TOTAL KNEE ARTHROPLASTY;  Surgeon: Gearlean Alf, MD;  Location: [facility];  Service: Orthopedics;  Laterality: Right;  . TUBAL LIGATION      No current facility-administered medications for this encounter.    Current Outpatient Medications  Medication Sig Dispense Refill Last Dose  . acetaminophen (TYLENOL) 500 MG tablet Take 2 tablets (1,000 mg total) by mouth every 6 (six) hours as needed for mild pain. 90 tablet 1   . amLODipine (NORVASC) 10 MG tablet TAKE 1 TABLET DAILY IN THE  EVENING (Patient taking differently: Take 10 mg by mouth every evening. ) 90 tablet 1   . atorvastatin (LIPITOR) 20 MG tablet Take 1 tablet (20 mg total) by mouth every evening. 90 tablet 1   . Cyanocobalamin (VITAMIN B-12 PO) Take 1 tablet by mouth daily.   Taking  . ferrous sulfate 325 (65 FE) MG tablet Take 325 mg by mouth 2 (two) times daily with a meal.     . GAVILAX powder MIX 17 GRAMS INTO 8OZ OF WATER AND DRINK DAILY (Patient taking differently: Take 17 g by mouth daily as needed for moderate constipation. ) 510 g 0 Taking  . hydrALAZINE (APRESOLINE) 100 MG tablet TAKE ONE (1) TABLET THREE (3) TIMES EACH DAY (Patient taking differently: Take 100 mg by mouth 3 (three) times daily. ) 270 tablet 1   . metFORMIN (GLUCOPHAGE-XR) 500 MG 24 hr tablet TAKE ONE (1) TABLET EACH DAY (Patient taking differently: Take 500 mg by mouth daily with breakfast. ) 90 tablet 1   . Multiple Vitamin (MULTIVITAMIN WITH MINERALS) TABS tablet Take 1 tablet by mouth daily.    Taking  . Nebivolol HCl (BYSTOLIC) 20 MG TABS TAKE ONE (1) TABLET EACH DAY (Patient taking differently: Take 20 mg by mouth daily. ) 90 each 1   . olmesartan (BENICAR) 40 MG tablet TAKE ONE (1) TABLET EACH DAY (Patient taking differently: Take 40 mg by mouth daily. ) 90 tablet 1   . oxyCODONE (OXY IR/ROXICODONE) 5 MG immediate release tablet Take 1-2 tablets (5-10 mg total) by mouth every 6 (six) hours as needed for severe pain. (Patient taking differently: Take 5 mg by mouth every 6 (six) hours as needed for severe pain. ) 56 tablet 0   . labetalol (NORMODYNE) 200 MG tablet Take 1 tablet (200 mg total) by mouth 2 (two) times daily. (Patient not taking: Reported on 10/20/2018) 180 tablet 1 Not Taking at Unknown time  . ONE TOUCH ULTRA TEST test strip Use to check glucose up to four times daily (Patient not taking: Reported on 10/20/2018) 400 each 0 Not Taking at Unknown time  . ONETOUCH DELICA LANCETS 85I MISC USE TO CHECK GLUCOSE UP TO  4 TIMES  DAILY (Patient not taking: Reported on 10/20/2018) 400 each 0 Not Taking at Unknown time   Facility-Administered Medications Ordered in Other Encounters  Medication Dose Route Frequency Provider Last Rate Last Dose  . tranexamic acid (CYKLOKAPRON) 2,000 mg in sodium chloride 0.9 % 50 mL Topical Application  6,270 mg Topical Once [name], MD       Allergies  Allergen Reactions  . Aleve [Naproxen Sodium] Other (See Comments)    Heart races  . Asa [Aspirin] Other (See Comments)    Nose bleeding  . Clonidine Derivatives Other (See Comments)    Dizziness and weakness  . Shellfish Allergy Nausea And Vomiting    Social History   Tobacco Use  . Smoking status: Never Smoker  . Smokeless tobacco: Never Used  Substance Use Topics  .  Alcohol use: No    Family History  Problem Relation Age of Onset  . Ovarian cancer Mother   . Cancer Mother   . Diabetes Son   . Diabetes Son   . Peripheral vascular disease Father        with amputation of both legs  . Hypertension Father   . Heart disease Brother   . Kidney disease Daughter   . Colon cancer Neg Hx   . Esophageal cancer Neg Hx   . Stomach cancer Neg Hx   . Rectal cancer Neg Hx      Review of Systems  Constitutional: Negative for chills and fever.  HENT: Negative for congestion, sore throat and tinnitus.   Eyes: Negative for double vision, photophobia and pain.  Respiratory: Negative for cough, shortness of breath and wheezing.   Cardiovascular: Negative for chest pain, palpitations and orthopnea.  Gastrointestinal: Negative for heartburn, nausea and vomiting.  Genitourinary: Negative for dysuria, frequency and urgency.  Musculoskeletal: Positive for joint pain.  Neurological: Negative for dizziness, weakness and headaches.    Objective:  Physical Exam  Well nourished and well developed. General: Alert and oriented x3, cooperative and pleasant, no acute distress. Head: normocephalic, atraumatic, neck supple. Eyes:  EOMI. Respiratory: breath sounds clear in all fields, no wheezing, rales, or rhonchi. Cardiovascular: Regular rate and rhythm, no murmurs, gallops or rubs.  Abdomen: non-tender to palpation and soft, normoactive bowel sounds. Musculoskeletal: Right Knee Exam:  No effusion.  She had drainage on her bandage but I could not elicit any more drainage once I removed the bandage.  She still has a tiny sinus area at the medial aspect of her lower leg.  Stable knee. Left Knee Exam: Tenderness to palpation of the medial and lateral joint line. ROM 0-120 degrees. No effusion, no instability. Moderate to severe crepitus.  Calves soft and nontender. Motor function intact in LE. Strength 5/5 LE bilaterally. Neuro: Distal pulses 2+. Sensation to light touch intact in LE.  Vital signs in last 24 hours: Temp:  [98.1 F (36.7 C)] 98.1 F (36.7 C) (11/20 1340) Pulse Rate:  [60] 60 (11/20 1340) Resp:  [20] 20 (11/20 1340) BP: (155)/(79) 155/79 (11/20 1340) SpO2:  [96 %] 96 % (11/20 1340)  Labs:   Estimated body mass index is 36.11 kg/m as calculated from the following:   Height as of 10/27/18: 5\' 5"  (1.651 m).   Weight as of 08/11/18: 98.4 kg.    Assessment/Plan:  Chronic prosthetic joint infection, right knee   We discussed the patient's history, presenting complaints, and treatment options. We discussed knee infections in the context of knee replacements. We went over a multitude of questions regarding the treatment of her infection. We discussed that she will not be able to have a reimplantation until her labs are normal and then we will get an MRI to ensure there is no further bone or soft tissue infection. Plan is for patient to be discharged to skilled nursing facility following hospital stay.  Therapy Plans: SNF Disposition: SNF at [facility] DVT Prophylaxis:Xarelto 10 mg daily DME needed: None PCP: Dr. Livia Snellen TXA: IV Allergies: Aspirin (bleeding) Anesthesia Concerns:  None BMI: 35.4 Last HgbA1c: 5.2%  - Patient was instructed on what medications to stop prior to surgery. - Follow-up visit in 2 weeks with Dr. Wynelle Link - Begin physical therapy following surgery - Pre-operative lab work as pre-surgical testing - Prescriptions will be provided in hospital at time of discharge  Theresa Duty, PA-C Orthopedic  Surgery [facility] [place]

## 2018-11-02 MED ORDER — BUPIVACAINE LIPOSOME 1.3 % IJ SUSP
20.0000 mL | INTRAMUSCULAR | Status: DC
  Filled 2018-11-02: qty 20

## 2018-11-02 NOTE — Progress Notes (Signed)
Pt advised that her surgery time has changed from 2:15 PM to 1:30 PM. Pt to arrive to admitting at 11:00 AM, and to remain on a Clear Liquid Diet until 7:30 AM. Pt verbalized understanding.

## 2018-11-03 ENCOUNTER — Inpatient Hospital Stay (HOSPITAL_COMMUNITY): Payer: Medicare Other | Admitting: Registered Nurse

## 2018-11-03 ENCOUNTER — Inpatient Hospital Stay (HOSPITAL_COMMUNITY)
Admission: RE | Admit: 2018-11-03 | Discharge: 2018-11-09 | DRG: 467 | Disposition: A | Discharge status: Skilled Nursing Facility | Payer: Medicare Other | Source: Ambulatory Visit | Attending: Orthopedic Surgery | Admitting: Orthopedic Surgery

## 2018-11-03 ENCOUNTER — Other Ambulatory Visit: Payer: Self-pay

## 2018-11-03 ENCOUNTER — Encounter (HOSPITAL_COMMUNITY)
Admission: RE | Disposition: A | Discharge status: Skilled Nursing Facility | Payer: Self-pay | Source: Ambulatory Visit | Attending: Orthopedic Surgery

## 2018-11-03 DIAGNOSIS — G8918 Other acute postprocedural pain: Secondary | ICD-10-CM | POA: Diagnosis not present

## 2018-11-03 DIAGNOSIS — I129 Hypertensive chronic kidney disease with stage 1 through stage 4 chronic kidney disease, or unspecified chronic kidney disease: Secondary | ICD-10-CM | POA: Diagnosis not present

## 2018-11-03 DIAGNOSIS — E119 Type 2 diabetes mellitus without complications: Secondary | ICD-10-CM | POA: Diagnosis not present

## 2018-11-03 DIAGNOSIS — Z79899 Other long term (current) drug therapy: Secondary | ICD-10-CM | POA: Diagnosis not present

## 2018-11-03 DIAGNOSIS — Z886 Allergy status to analgesic agent status: Secondary | ICD-10-CM | POA: Diagnosis not present

## 2018-11-03 DIAGNOSIS — Z4789 Encounter for other orthopedic aftercare: Secondary | ICD-10-CM | POA: Diagnosis not present

## 2018-11-03 DIAGNOSIS — Z7984 Long term (current) use of oral hypoglycemic drugs: Secondary | ICD-10-CM | POA: Diagnosis not present

## 2018-11-03 DIAGNOSIS — M009 Pyogenic arthritis, unspecified: Secondary | ICD-10-CM

## 2018-11-03 DIAGNOSIS — Z4733 Aftercare following explantation of knee joint prosthesis: Secondary | ICD-10-CM | POA: Diagnosis not present

## 2018-11-03 DIAGNOSIS — E785 Hyperlipidemia, unspecified: Secondary | ICD-10-CM | POA: Diagnosis not present

## 2018-11-03 DIAGNOSIS — I10 Essential (primary) hypertension: Secondary | ICD-10-CM | POA: Diagnosis not present

## 2018-11-03 DIAGNOSIS — Z452 Encounter for adjustment and management of vascular access device: Secondary | ICD-10-CM | POA: Diagnosis not present

## 2018-11-03 DIAGNOSIS — Z8619 Personal history of other infectious and parasitic diseases: Secondary | ICD-10-CM

## 2018-11-03 DIAGNOSIS — T8453XA Infection and inflammatory reaction due to internal right knee prosthesis, initial encounter: Principal | ICD-10-CM | POA: Diagnosis present

## 2018-11-03 DIAGNOSIS — R52 Pain, unspecified: Secondary | ICD-10-CM | POA: Diagnosis not present

## 2018-11-03 DIAGNOSIS — R011 Cardiac murmur, unspecified: Secondary | ICD-10-CM | POA: Diagnosis not present

## 2018-11-03 DIAGNOSIS — Z8673 Personal history of transient ischemic attack (TIA), and cerebral infarction without residual deficits: Secondary | ICD-10-CM

## 2018-11-03 DIAGNOSIS — N183 Chronic kidney disease, stage 3 (moderate): Secondary | ICD-10-CM | POA: Diagnosis present

## 2018-11-03 DIAGNOSIS — Z888 Allergy status to other drugs, medicaments and biological substances status: Secondary | ICD-10-CM

## 2018-11-03 DIAGNOSIS — M00861 Arthritis due to other bacteria, right knee: Secondary | ICD-10-CM | POA: Diagnosis not present

## 2018-11-03 DIAGNOSIS — E1122 Type 2 diabetes mellitus with diabetic chronic kidney disease: Secondary | ICD-10-CM | POA: Diagnosis not present

## 2018-11-03 DIAGNOSIS — B965 Pseudomonas (aeruginosa) (mallei) (pseudomallei) as the cause of diseases classified elsewhere: Secondary | ICD-10-CM | POA: Diagnosis not present

## 2018-11-03 DIAGNOSIS — Z91013 Allergy to seafood: Secondary | ICD-10-CM

## 2018-11-03 DIAGNOSIS — Y831 Surgical operation with implant of artificial internal device as the cause of abnormal reaction of the patient, or of later complication, without mention of misadventure at the time of the procedure: Secondary | ICD-10-CM | POA: Diagnosis present

## 2018-11-03 DIAGNOSIS — H269 Unspecified cataract: Secondary | ICD-10-CM | POA: Diagnosis not present

## 2018-11-03 DIAGNOSIS — N189 Chronic kidney disease, unspecified: Secondary | ICD-10-CM | POA: Diagnosis not present

## 2018-11-03 DIAGNOSIS — K5900 Constipation, unspecified: Secondary | ICD-10-CM | POA: Diagnosis not present

## 2018-11-03 DIAGNOSIS — Z96651 Presence of right artificial knee joint: Secondary | ICD-10-CM | POA: Diagnosis not present

## 2018-11-03 DIAGNOSIS — M1712 Unilateral primary osteoarthritis, left knee: Secondary | ICD-10-CM | POA: Diagnosis not present

## 2018-11-03 DIAGNOSIS — D509 Iron deficiency anemia, unspecified: Secondary | ICD-10-CM | POA: Diagnosis not present

## 2018-11-03 HISTORY — PX: EXCISIONAL TOTAL KNEE ARTHROPLASTY WITH ANTIBIOTIC SPACERS: SHX5827

## 2018-11-03 LAB — HEMOGLOBIN A1C
HEMOGLOBIN A1C: 5.9 % — AB (ref 4.8–5.6)
MEAN PLASMA GLUCOSE: 122.63 mg/dL

## 2018-11-03 LAB — TYPE AND SCREEN
ABO/RH(D): O POS
Antibody Screen: NEGATIVE

## 2018-11-03 LAB — GLUCOSE, CAPILLARY: GLUCOSE-CAPILLARY: 98 mg/dL (ref 70–99)

## 2018-11-03 SURGERY — REMOVAL, TOTAL ARTHROPLASTY HARDWARE, KNEE, WITH ANTIBIOTIC SPACER INSERTION
Anesthesia: General | Site: Knee | Laterality: Right

## 2018-11-03 MED ORDER — DEXAMETHASONE SODIUM PHOSPHATE 10 MG/ML IJ SOLN
INTRAMUSCULAR | Status: DC | PRN
  Administered 2018-11-03: 8 mg via INTRAVENOUS

## 2018-11-03 MED ORDER — LIDOCAINE 2% (20 MG/ML) 5 ML SYRINGE
INTRAMUSCULAR | Status: DC | PRN
  Administered 2018-11-03: 80 mg via INTRAVENOUS

## 2018-11-03 MED ORDER — IRBESARTAN 150 MG PO TABS
300.0000 mg | ORAL_TABLET | Freq: Every day | ORAL | Status: DC
  Administered 2018-11-04 – 2018-11-09 (×6): 300 mg via ORAL
  Filled 2018-11-03 (×6): qty 2

## 2018-11-03 MED ORDER — DEXAMETHASONE SODIUM PHOSPHATE 10 MG/ML IJ SOLN
INTRAMUSCULAR | Status: AC
  Filled 2018-11-03: qty 1

## 2018-11-03 MED ORDER — AMLODIPINE BESYLATE 10 MG PO TABS
10.0000 mg | ORAL_TABLET | Freq: Every evening | ORAL | Status: DC
  Administered 2018-11-04 – 2018-11-08 (×5): 10 mg via ORAL
  Filled 2018-11-03 (×5): qty 1

## 2018-11-03 MED ORDER — FERROUS SULFATE 325 (65 FE) MG PO TABS
325.0000 mg | ORAL_TABLET | Freq: Two times a day (BID) | ORAL | Status: DC
  Administered 2018-11-04 – 2018-11-09 (×11): 325 mg via ORAL
  Filled 2018-11-03 (×11): qty 1

## 2018-11-03 MED ORDER — FENTANYL CITRATE (PF) 100 MCG/2ML IJ SOLN
INTRAMUSCULAR | Status: AC
  Filled 2018-11-03: qty 2

## 2018-11-03 MED ORDER — DEXAMETHASONE SODIUM PHOSPHATE 10 MG/ML IJ SOLN: 8.0000 mg | Freq: Once | INTRAMUSCULAR | Status: DC

## 2018-11-03 MED ORDER — SODIUM CHLORIDE 0.9 % IV SOLN
INTRAVENOUS | Status: DC
  Administered 2018-11-03: 19:00:00 via INTRAVENOUS

## 2018-11-03 MED ORDER — FENTANYL CITRATE (PF) 100 MCG/2ML IJ SOLN
100.0000 ug | Freq: Once | INTRAMUSCULAR | Status: AC
  Administered 2018-11-03: 50 ug via INTRAVENOUS

## 2018-11-03 MED ORDER — METOCLOPRAMIDE HCL 5 MG/ML IJ SOLN: 5.0000 mg | Freq: Three times a day (TID) | INTRAMUSCULAR | Status: DC | PRN

## 2018-11-03 MED ORDER — PHENOL 1.4 % MT LIQD
1.0000 | OROMUCOSAL | Status: DC | PRN
  Filled 2018-11-03: qty 177

## 2018-11-03 MED ORDER — FENTANYL CITRATE (PF) 100 MCG/2ML IJ SOLN
25.0000 ug | INTRAMUSCULAR | Status: DC | PRN
  Administered 2018-11-03 (×3): 50 ug via INTRAVENOUS

## 2018-11-03 MED ORDER — POLYETHYLENE GLYCOL 3350 17 G PO PACK
17.0000 g | PACK | Freq: Every day | ORAL | Status: DC | PRN
  Administered 2018-11-06: 17 g via ORAL
  Filled 2018-11-03: qty 1

## 2018-11-03 MED ORDER — VANCOMYCIN HCL IN DEXTROSE 1-5 GM/200ML-% IV SOLN
1000.0000 mg | INTRAVENOUS | Status: DC
  Administered 2018-11-04 – 2018-11-06 (×2): 1000 mg via INTRAVENOUS
  Filled 2018-11-03 (×2): qty 200

## 2018-11-03 MED ORDER — METOCLOPRAMIDE HCL 5 MG PO TABS
5.0000 mg | ORAL_TABLET | Freq: Three times a day (TID) | ORAL | Status: DC | PRN
  Filled 2018-11-03: qty 2

## 2018-11-03 MED ORDER — METHOCARBAMOL 500 MG IVPB - SIMPLE MED
INTRAVENOUS | Status: AC
  Filled 2018-11-03: qty 50

## 2018-11-03 MED ORDER — HYDRALAZINE HCL 50 MG PO TABS
100.0000 mg | ORAL_TABLET | Freq: Three times a day (TID) | ORAL | Status: DC
  Administered 2018-11-03 – 2018-11-09 (×17): 100 mg via ORAL
  Filled 2018-11-03 (×17): qty 2

## 2018-11-03 MED ORDER — ACETAMINOPHEN 10 MG/ML IV SOLN
1000.0000 mg | Freq: Four times a day (QID) | INTRAVENOUS | Status: DC
  Administered 2018-11-03: 1000 mg via INTRAVENOUS
  Filled 2018-11-03: qty 100

## 2018-11-03 MED ORDER — OXYCODONE HCL 5 MG PO TABS
10.0000 mg | ORAL_TABLET | ORAL | Status: DC | PRN
  Administered 2018-11-03 – 2018-11-08 (×7): 10 mg via ORAL
  Filled 2018-11-03 (×9): qty 2

## 2018-11-03 MED ORDER — ONDANSETRON HCL 4 MG/2ML IJ SOLN: 4.0000 mg | Freq: Four times a day (QID) | INTRAMUSCULAR | Status: DC | PRN

## 2018-11-03 MED ORDER — PROPOFOL 10 MG/ML IV BOLUS
INTRAVENOUS | Status: DC | PRN
  Administered 2018-11-03: 110 mg via INTRAVENOUS
  Administered 2018-11-03: 140 mg via INTRAVENOUS

## 2018-11-03 MED ORDER — VANCOMYCIN HCL 10 G IV SOLR
2000.0000 mg | Freq: Once | INTRAVENOUS | Status: AC
  Administered 2018-11-03: 2000 mg via INTRAVENOUS
  Filled 2018-11-03: qty 2000

## 2018-11-03 MED ORDER — FENTANYL CITRATE (PF) 100 MCG/2ML IJ SOLN
INTRAMUSCULAR | Status: DC | PRN
  Administered 2018-11-03 (×5): 25 ug via INTRAVENOUS
  Administered 2018-11-03: 50 ug via INTRAVENOUS
  Administered 2018-11-03: 25 ug via INTRAVENOUS

## 2018-11-03 MED ORDER — MENTHOL 3 MG MT LOZG: 1.0000 | LOZENGE | OROMUCOSAL | Status: DC | PRN

## 2018-11-03 MED ORDER — OXYCODONE HCL 5 MG/5ML PO SOLN
5.0000 mg | Freq: Once | ORAL | Status: DC | PRN
  Filled 2018-11-03: qty 5

## 2018-11-03 MED ORDER — SODIUM CHLORIDE 0.9 % IR SOLN
Status: DC | PRN
  Administered 2018-11-03: 4000 mL

## 2018-11-03 MED ORDER — 0.9 % SODIUM CHLORIDE (POUR BTL) OPTIME
TOPICAL | Status: DC | PRN
  Administered 2018-11-03: 1000 mL

## 2018-11-03 MED ORDER — HYDROMORPHONE HCL 1 MG/ML IJ SOLN
INTRAMUSCULAR | Status: AC
  Filled 2018-11-03: qty 1

## 2018-11-03 MED ORDER — ACETAMINOPHEN 500 MG PO TABS
1000.0000 mg | ORAL_TABLET | Freq: Four times a day (QID) | ORAL | Status: AC
  Administered 2018-11-03 – 2018-11-04 (×2): 1000 mg via ORAL
  Filled 2018-11-03 (×2): qty 2

## 2018-11-03 MED ORDER — OXYCODONE HCL 5 MG PO TABS: 5.0000 mg | ORAL_TABLET | Freq: Once | ORAL | Status: DC | PRN

## 2018-11-03 MED ORDER — METHOCARBAMOL 500 MG PO TABS
500.0000 mg | ORAL_TABLET | Freq: Four times a day (QID) | ORAL | Status: DC | PRN
  Administered 2018-11-03 – 2018-11-08 (×11): 500 mg via ORAL
  Filled 2018-11-03 (×12): qty 1

## 2018-11-03 MED ORDER — TRANEXAMIC ACID-NACL 1000-0.7 MG/100ML-% IV SOLN
1000.0000 mg | INTRAVENOUS | Status: AC
  Administered 2018-11-03: 1000 mg via INTRAVENOUS
  Filled 2018-11-03: qty 100

## 2018-11-03 MED ORDER — LACTATED RINGERS IV SOLN
INTRAVENOUS | Status: DC
  Administered 2018-11-03: 12:00:00 via INTRAVENOUS

## 2018-11-03 MED ORDER — ONDANSETRON HCL 4 MG/2ML IJ SOLN
4.0000 mg | Freq: Four times a day (QID) | INTRAMUSCULAR | Status: DC | PRN
  Administered 2018-11-03: 4 mg via INTRAVENOUS
  Filled 2018-11-03: qty 2

## 2018-11-03 MED ORDER — TRANEXAMIC ACID-NACL 1000-0.7 MG/100ML-% IV SOLN
1000.0000 mg | Freq: Once | INTRAVENOUS | Status: AC
  Administered 2018-11-03: 1000 mg via INTRAVENOUS
  Filled 2018-11-03: qty 100

## 2018-11-03 MED ORDER — BISACODYL 10 MG RE SUPP: 10.0000 mg | Freq: Every day | RECTAL | Status: DC | PRN

## 2018-11-03 MED ORDER — DOCUSATE SODIUM 100 MG PO CAPS
100.0000 mg | ORAL_CAPSULE | Freq: Two times a day (BID) | ORAL | Status: DC
  Administered 2018-11-03 – 2018-11-09 (×12): 100 mg via ORAL
  Filled 2018-11-03 (×12): qty 1

## 2018-11-03 MED ORDER — DEXAMETHASONE SODIUM PHOSPHATE 10 MG/ML IJ SOLN
10.0000 mg | Freq: Once | INTRAMUSCULAR | Status: AC
  Administered 2018-11-04: 10 mg via INTRAVENOUS
  Filled 2018-11-03: qty 1

## 2018-11-03 MED ORDER — HYDROMORPHONE HCL 1 MG/ML IJ SOLN
0.2500 mg | INTRAMUSCULAR | Status: DC | PRN
  Administered 2018-11-03 (×2): 0.5 mg via INTRAVENOUS

## 2018-11-03 MED ORDER — DIPHENHYDRAMINE HCL 12.5 MG/5ML PO ELIX: 12.5000 mg | ORAL_SOLUTION | ORAL | Status: DC | PRN

## 2018-11-03 MED ORDER — SODIUM CHLORIDE (PF) 0.9 % IJ SOLN
INTRAMUSCULAR | Status: AC
  Filled 2018-11-03: qty 10

## 2018-11-03 MED ORDER — ONDANSETRON HCL 4 MG PO TABS: 4.0000 mg | ORAL_TABLET | Freq: Four times a day (QID) | ORAL | Status: DC | PRN

## 2018-11-03 MED ORDER — ONDANSETRON HCL 4 MG/2ML IJ SOLN
INTRAMUSCULAR | Status: DC | PRN
  Administered 2018-11-03: 4 mg via INTRAVENOUS

## 2018-11-03 MED ORDER — MIDAZOLAM HCL 2 MG/2ML IJ SOLN
2.0000 mg | Freq: Once | INTRAMUSCULAR | Status: AC
  Administered 2018-11-03: 2 mg via INTRAVENOUS

## 2018-11-03 MED ORDER — ATORVASTATIN CALCIUM 20 MG PO TABS
20.0000 mg | ORAL_TABLET | Freq: Every evening | ORAL | Status: DC
  Administered 2018-11-03 – 2018-11-08 (×6): 20 mg via ORAL
  Filled 2018-11-03 (×6): qty 1

## 2018-11-03 MED ORDER — FLEET ENEMA 7-19 GM/118ML RE ENEM: 1.0000 | ENEMA | Freq: Once | RECTAL | Status: DC | PRN

## 2018-11-03 MED ORDER — PROPOFOL 10 MG/ML IV BOLUS
INTRAVENOUS | Status: AC
  Filled 2018-11-03: qty 40

## 2018-11-03 MED ORDER — TRAMADOL HCL 50 MG PO TABS
50.0000 mg | ORAL_TABLET | Freq: Four times a day (QID) | ORAL | Status: DC | PRN
  Administered 2018-11-06 – 2018-11-07 (×6): 50 mg via ORAL
  Administered 2018-11-08: 100 mg via ORAL
  Administered 2018-11-08: 50 mg via ORAL
  Administered 2018-11-09: 100 mg via ORAL
  Filled 2018-11-03 (×2): qty 1
  Filled 2018-11-03 (×2): qty 2
  Filled 2018-11-03 (×5): qty 1

## 2018-11-03 MED ORDER — ONDANSETRON HCL 4 MG/2ML IJ SOLN
INTRAMUSCULAR | Status: AC
  Filled 2018-11-03: qty 2

## 2018-11-03 MED ORDER — ROPIVACAINE HCL 7.5 MG/ML IJ SOLN
INTRAMUSCULAR | Status: DC | PRN
  Administered 2018-11-03: 20 mL via PERINEURAL

## 2018-11-03 MED ORDER — CHLORHEXIDINE GLUCONATE 4 % EX LIQD: 60.0000 mL | Freq: Once | CUTANEOUS | Status: DC

## 2018-11-03 MED ORDER — RIVAROXABAN 10 MG PO TABS
10.0000 mg | ORAL_TABLET | Freq: Every day | ORAL | Status: DC
  Administered 2018-11-04 – 2018-11-09 (×6): 10 mg via ORAL
  Filled 2018-11-03 (×6): qty 1

## 2018-11-03 MED ORDER — SODIUM CHLORIDE (PF) 0.9 % IJ SOLN
INTRAMUSCULAR | Status: AC
  Filled 2018-11-03: qty 50

## 2018-11-03 MED ORDER — OXYCODONE HCL 5 MG PO TABS
5.0000 mg | ORAL_TABLET | ORAL | Status: DC | PRN
  Administered 2018-11-08 – 2018-11-09 (×2): 5 mg via ORAL
  Filled 2018-11-03 (×2): qty 1

## 2018-11-03 MED ORDER — NEBIVOLOL HCL 10 MG PO TABS
20.0000 mg | ORAL_TABLET | Freq: Every day | ORAL | Status: DC
  Administered 2018-11-04 – 2018-11-09 (×6): 20 mg via ORAL
  Filled 2018-11-03 (×6): qty 2

## 2018-11-03 MED ORDER — MIDAZOLAM HCL 2 MG/2ML IJ SOLN
INTRAMUSCULAR | Status: AC
  Filled 2018-11-03: qty 2

## 2018-11-03 MED ORDER — MORPHINE SULFATE (PF) 2 MG/ML IV SOLN
1.0000 mg | INTRAVENOUS | Status: DC | PRN
  Administered 2018-11-03: 2 mg via INTRAVENOUS
  Filled 2018-11-03: qty 1

## 2018-11-03 MED ORDER — CEFAZOLIN SODIUM-DEXTROSE 2-4 GM/100ML-% IV SOLN
2.0000 g | INTRAVENOUS | Status: AC
  Administered 2018-11-03: 2 g via INTRAVENOUS
  Filled 2018-11-03: qty 100

## 2018-11-03 MED ORDER — METHOCARBAMOL 500 MG IVPB - SIMPLE MED
500.0000 mg | Freq: Four times a day (QID) | INTRAVENOUS | Status: DC | PRN
  Administered 2018-11-03: 500 mg via INTRAVENOUS
  Filled 2018-11-03: qty 50

## 2018-11-03 MED ORDER — STERILE WATER FOR IRRIGATION IR SOLN
Status: DC | PRN
  Administered 2018-11-03: 1000 mL

## 2018-11-03 SURGICAL SUPPLY — 64 items
BAG SPEC THK2 15X12 ZIP CLS (MISCELLANEOUS) ×1
BAG ZIPLOCK 12X15 (MISCELLANEOUS) ×3 IMPLANT
BANDAGE ACE 6X5 VEL STRL LF (GAUZE/BANDAGES/DRESSINGS) ×3 IMPLANT
BANDAGE ESMARK 6X9 LF (GAUZE/BANDAGES/DRESSINGS) ×1 IMPLANT
BLADE SAG 18X100X1.27 (BLADE) ×3 IMPLANT
BLADE SAW SGTL 11.0X1.19X90.0M (BLADE) ×3 IMPLANT
BNDG CMPR 9X6 STRL LF SNTH (GAUZE/BANDAGES/DRESSINGS) ×1
BNDG ESMARK 6X9 LF (GAUZE/BANDAGES/DRESSINGS) ×3
BONE CEMENT GENTAMICIN (Cement) ×12 IMPLANT
CEMENT BONE GENTAMICIN 40 (Cement) ×3 IMPLANT
COVER SURGICAL LIGHT HANDLE (MISCELLANEOUS) ×3 IMPLANT
COVER WAND RF STERILE (DRAPES) ×2 IMPLANT
CUFF TOURN SGL QUICK 34 (TOURNIQUET CUFF) ×6
CUFF TRNQT CYL 34X4X40X1 (TOURNIQUET CUFF) ×2 IMPLANT
DRAPE EXTREMITY T 121X128X90 (DRAPE) ×3 IMPLANT
DRAPE POUCH INSTRU U-SHP 10X18 (DRAPES) ×3 IMPLANT
DRAPE U-SHAPE 47X51 STRL (DRAPES) ×3 IMPLANT
DRSG ADAPTIC 3X8 NADH LF (GAUZE/BANDAGES/DRESSINGS) ×3 IMPLANT
DRSG PAD ABDOMINAL 8X10 ST (GAUZE/BANDAGES/DRESSINGS) ×3 IMPLANT
DURAPREP 26ML APPLICATOR (WOUND CARE) ×3 IMPLANT
ELECT REM PT RETURN 15FT ADLT (MISCELLANEOUS) ×3 IMPLANT
EVACUATOR 1/8 PVC DRAIN (DRAIN) ×3 IMPLANT
FACESHIELD WRAPAROUND (MASK) ×15 IMPLANT
FACESHIELD WRAPAROUND OR TEAM (MASK) ×5 IMPLANT
GAUZE SPONGE 4X4 12PLY STRL (GAUZE/BANDAGES/DRESSINGS) ×3 IMPLANT
GLOVE BIO SURGEON STRL SZ7 (GLOVE) ×2 IMPLANT
GLOVE BIO SURGEON STRL SZ8 (GLOVE) ×6 IMPLANT
GLOVE BIOGEL PI IND STRL 6.5 (GLOVE) IMPLANT
GLOVE BIOGEL PI IND STRL 7.0 (GLOVE) ×1 IMPLANT
GLOVE BIOGEL PI IND STRL 7.5 (GLOVE) IMPLANT
GLOVE BIOGEL PI IND STRL 8 (GLOVE) ×1 IMPLANT
GLOVE BIOGEL PI INDICATOR 6.5 (GLOVE) ×2
GLOVE BIOGEL PI INDICATOR 7.0 (GLOVE) ×6
GLOVE BIOGEL PI INDICATOR 7.5 (GLOVE) ×10
GLOVE BIOGEL PI INDICATOR 8 (GLOVE) ×2
GLOVE SURG SS PI 6.5 STRL IVOR (GLOVE) ×2 IMPLANT
GLOVE SURG SS PI 7.0 STRL IVOR (GLOVE) ×3 IMPLANT
GOWN STRL REUS W/TWL LRG LVL3 (GOWN DISPOSABLE) ×8 IMPLANT
GOWN STRL REUS W/TWL XL LVL3 (GOWN DISPOSABLE) ×7 IMPLANT
HANDPIECE INTERPULSE COAX TIP (DISPOSABLE) ×3
IMMOBILIZER KNEE 20 (SOFTGOODS) ×3
IMMOBILIZER KNEE 20 THIGH 36 (SOFTGOODS) IMPLANT
MANIFOLD NEPTUNE II (INSTRUMENTS) ×3 IMPLANT
PAD ABD 8X10 STRL (GAUZE/BANDAGES/DRESSINGS) ×2 IMPLANT
PADDING CAST COTTON 6X4 STRL (CAST SUPPLIES) ×4 IMPLANT
PROTECTOR NERVE ULNAR (MISCELLANEOUS) ×3 IMPLANT
SET HNDPC FAN SPRY TIP SCT (DISPOSABLE) ×1 IMPLANT
STAPLER VISISTAT 35W (STAPLE) ×3 IMPLANT
SUCTION FRAZIER HANDLE 10FR (MISCELLANEOUS) ×2
SUCTION TUBE FRAZIER 10FR DISP (MISCELLANEOUS) ×1 IMPLANT
SUT PDS AB 1 CT1 27 (SUTURE) ×5 IMPLANT
SUT STRATAFIX 0 PDS 27 VIOLET (SUTURE) ×3
SUT VIC AB 1 CT1 27 (SUTURE) ×9
SUT VIC AB 1 CT1 27XBRD ANTBC (SUTURE) ×3 IMPLANT
SUT VIC AB 2-0 CT1 27 (SUTURE) ×12
SUT VIC AB 2-0 CT1 TAPERPNT 27 (SUTURE) ×3 IMPLANT
SUTURE STRATFX 0 PDS 27 VIOLET (SUTURE) IMPLANT
SWAB COLLECTION DEVICE MRSA (MISCELLANEOUS) ×5 IMPLANT
SWAB CULTURE ESWAB REG 1ML (MISCELLANEOUS) ×5 IMPLANT
TOWEL OR 17X26 10 PK STRL BLUE (TOWEL DISPOSABLE) ×3 IMPLANT
TOWER CARTRIDGE SMART MIX (DISPOSABLE) ×3 IMPLANT
TRAY FOLEY MTR SLVR 14FR STAT (SET/KITS/TRAYS/PACK) ×2 IMPLANT
WRAP KNEE MAXI GEL POST OP (GAUZE/BANDAGES/DRESSINGS) ×4 IMPLANT
YANKAUER SUCT BULB TIP 10FT TU (MISCELLANEOUS) ×2 IMPLANT

## 2018-11-03 NOTE — Anesthesia Procedure Notes (Signed)
Procedure Name: LMA Insertion Date/Time: 11/03/2018 1:00 PM Performed by: Victoriano Lain, CRNA Pre-anesthesia Checklist: Patient identified, Emergency Drugs available, Suction available, Patient being monitored and Timeout performed Patient Re-evaluated:Patient Re-evaluated prior to induction Oxygen Delivery Method: Circle system utilized Preoxygenation: Pre-oxygenation with 100% oxygen Induction Type: IV induction LMA: LMA with gastric port inserted LMA Size: 4.0 Number of attempts: 1 Placement Confirmation: positive ETCO2 and breath sounds checked- equal and bilateral Tube secured with: Tape Dental Injury: Teeth and Oropharynx as per pre-operative assessment

## 2018-11-03 NOTE — Anesthesia Preprocedure Evaluation (Signed)
Anesthesia Evaluation  Patient identified by MRN, date of birth, ID band Patient awake    Reviewed: Allergy & Precautions, H&P , NPO status , Patient's Chart, lab work & pertinent test results  Airway Mallampati: II   Neck ROM: full    Dental   Pulmonary neg pulmonary ROS,    breath sounds clear to auscultation       Cardiovascular hypertension,  Rhythm:regular Rate:Normal     Neuro/Psych CVA, No Residual Symptoms    GI/Hepatic   Endo/Other  diabetes, Type 2  Renal/GU Renal InsufficiencyRenal disease     Musculoskeletal  (+) Arthritis ,   Abdominal   Peds  Hematology   Anesthesia Other Findings   Reproductive/Obstetrics                             Anesthesia Physical Anesthesia Plan  ASA: III  Anesthesia Plan: General   Post-op Pain Management:  Regional for Post-op pain   Induction: Intravenous  PONV Risk Score and Plan: 3 and Ondansetron, Dexamethasone and Treatment may vary due to age or medical condition  Airway Management Planned: LMA  Additional Equipment:   Intra-op Plan:   Post-operative Plan:   Informed Consent: I have reviewed the patients History and Physical, chart, labs and discussed the procedure including the risks, benefits and alternatives for the proposed anesthesia with the patient or authorized representative who has indicated his/her understanding and acceptance.     Plan Discussed with: CRNA, Anesthesiologist and Surgeon  Anesthesia Plan Comments:         Anesthesia Quick Evaluation

## 2018-11-03 NOTE — Progress Notes (Signed)
Assisted Dr. Hodierne with right, ultrasound guided, adductor canal block. Side rails up, monitors on throughout procedure. See vital signs in flow sheet. Tolerated Procedure well.  

## 2018-11-03 NOTE — Progress Notes (Signed)
Pharmacy Antibiotic Note  Beth [name] is a 72 y.o. female admitted on 11/03/2018 with prosthetic joint infection.  Pharmacy has been consulted for vancomycin dosing.  Plan:  Vancomycin 2g IV x 1, then 1g IV q24h for estimated AUC 476  Check vancomycin levels at steady state, goal AUC 400-500  Follow up renal function & cultures  Height: 5\' 5"  (165.1 cm) Weight: 216 lb 0.8 oz (98 kg) IBW/kg (Calculated) : 57  Temp (24hrs), Avg:97.6 F (36.4 C), Min:97.4 F (36.3 C), Max:97.8 F (36.6 C)  No results for input(s): WBC, CREATININE, LATICACIDVEN, VANCOTROUGH, VANCOPEAK, VANCORANDOM, GENTTROUGH, GENTPEAK, GENTRANDOM, TOBRATROUGH, TOBRAPEAK, TOBRARND, AMIKACINPEAK, AMIKACINTROU, AMIKACIN in the last 168 hours.  Estimated Creatinine Clearance: 61.5 mL/min (by C-G formula based on SCr of 0.93 mg/dL).    Allergies  Allergen Reactions  . Aleve [Naproxen Sodium] Other (See Comments)    Heart races  . Asa [Aspirin] Other (See Comments)    Nose bleeding  . Clonidine Derivatives Other (See Comments)    Dizziness and weakness  . Shellfish Allergy Nausea And Vomiting    Antimicrobials this admission: 11/27 Vancomycin >>  Dose adjustments this admission:  Microbiology results: 11/27 R synovial knee fluid:  Thank you for allowing pharmacy to be a part of this patient's care.  Peggyann Juba, PharmD, BCPS Pager: [phone] 11/03/2018 7:19 PM

## 2018-11-03 NOTE — Anesthesia Procedure Notes (Signed)
Anesthesia Regional Block: Adductor canal block   Pre-Anesthetic Checklist: ,, timeout performed, Correct Patient, Correct Site, Correct Laterality, Correct Procedure, Correct Position, site marked, Risks and benefits discussed,  Surgical consent,  Pre-op evaluation,  At surgeon's request and post-op pain management  Laterality: Right  Prep: chloraprep       Needles:  Injection technique: Single-shot  Needle Type: Echogenic Needle     Needle Length: 9cm  Needle Gauge: 21     Additional Needles:   Narrative:  Start time: 11/03/2018 12:21 PM End time: 11/03/2018 12:30 PM Injection made incrementally with aspirations every 5 mL.  Performed by: Personally  Anesthesiologist: Albertha Ghee, MD  Additional Notes: Pt tolerated the procedure well.

## 2018-11-03 NOTE — Interval H&P Note (Signed)
History and Physical Interval Note:  11/03/2018 11:28 AM  Beth [name]  has presented today for surgery, with the diagnosis of infected right total knee arthroplasty  The various methods of treatment have been discussed with the patient and family. After consideration of risks, benefits and other options for treatment, the patient has consented to  Procedure(s) with comments: Right knee resection arthroplasty; antibiotic spacer (Right) - 120min as a surgical intervention .  The patient's history has been reviewed, patient examined, no change in status, stable for surgery.  I have reviewed the patient's chart and labs.  Questions were answered to the patient's satisfaction.     Pilar Plate [name]

## 2018-11-03 NOTE — Transfer of Care (Signed)
Immediate Anesthesia Transfer of Care Note  Patient: Beth [name]  Procedure(s) Performed: Right knee resection arthroplasty; antibiotic spacer (Right Knee)  Patient Location: PACU  Anesthesia Type:General  Level of Consciousness: awake, alert , oriented and patient cooperative  Airway & Oxygen Therapy: Patient Spontanous Breathing and Patient connected to face mask oxygen  Post-op Assessment: Report given to RN, Post -op Vital signs reviewed and stable and Patient moving all extremities  Post vital signs: Reviewed and stable  Last Vitals:  Vitals Value Taken Time  BP 143/77 11/03/2018  3:55 PM  Temp    Pulse 62 11/03/2018  3:57 PM  Resp 22 11/03/2018  3:57 PM  SpO2 98 % 11/03/2018  3:57 PM  Vitals shown include unvalidated device data.  Last Pain:  Vitals:   11/03/18 1215  TempSrc:   PainSc: 3       Patients Stated Pain Goal: 3 (00/86/76 1950)  Complications: No apparent anesthesia complications

## 2018-11-03 NOTE — Brief Op Note (Signed)
11/03/2018  3:38 PM  PATIENT:  Beth [name]  72 y.o. female  PRE-OPERATIVE DIAGNOSIS:  infected right total knee arthroplasty  POST-OPERATIVE DIAGNOSIS:  infected right total knee arthroplasty  PROCEDURE:  Procedure(s) with comments: Right knee resection arthroplasty; antibiotic spacer (Right) - Adductor Block  SURGEON:  Surgeon(s) and Role:    [name], MD - Primary  PHYSICIAN ASSISTANT:   ASSISTANTS: Theresa Duty, PA-C   ANESTHESIA:   general  EBL:  50 mL   BLOOD ADMINISTERED:none  DRAINS: (Medium) Hemovact drain(s) in the right knee with  Suction Open   LOCAL MEDICATIONS USED:  NONE  COUNTS:  YES  TOURNIQUET:  106 minutes @ 300 mm Hg  DICTATION: .Other Dictation: Dictation Number [id number]  PLAN OF CARE: Admit to inpatient   PATIENT DISPOSITION:  PACU - hemodynamically stable.

## 2018-11-03 NOTE — Discharge Instructions (Addendum)
Dr. [name] Total Joint Specialist [facility] [address] [place], [place] [address] [phone]  [place]   Remove items at home which could result in a fall. This includes throw rugs or furniture in walking pathways.   ICE to the affected knee every three hours for 30 minutes at a time and then as needed for pain and swelling.  Continue to use ice on the knee for pain and swelling from surgery. You may notice swelling that will progress down to the foot and ankle.  This is normal after surgery.  Elevate the leg when you are not up walking on it.    Continue to use the breathing machine which will help keep your temperature down.  It is common for your temperature to cycle up and down following surgery, especially at night when you are not up moving around and exerting yourself.  The breathing machine keeps your lungs expanded and your temperature down.  DIET You may resume your previous home diet once your are discharged from the hospital.  DRESSING / WOUND CARE / SHOWERING You may shower 3 days after surgery, but keep the wounds dry during showering.  You may use an occlusive plastic wrap (Press'n Seal for example), NO SOAKING/SUBMERGING IN THE BATHTUB.  If the bandage gets wet, change with a clean dry gauze.  If the incision gets wet, pat the wound dry with a clean towel. You may start showering once you are discharged home but do not submerge the incision under water. Just pat the incision dry and apply a dry gauze dressing on daily. Change the surgical dressing daily and reapply a dry dressing each time.  ACTIVITY Keep leg in knee immobilizer at all times Walk with your walker as instructed. Use walker as long as suggested by your caregivers. Avoid periods of inactivity such as sitting longer than an hour when not asleep. This helps prevent blood clots.  You may resume a sexual relationship in one month or when  given the OK by your doctor.  You may return to work once you are cleared by your doctor.  Do not drive a car for 6 weeks or until released by you surgeon.  Do not drive while taking narcotics.  WEIGHT BEARING Weight bearing as tolerated with assist device (walker, cane, etc) as directed, use it as long as suggested by your surgeon or therapist, typically at least 4-6 weeks.  POSTOPERATIVE CONSTIPATION PROTOCOL Constipation - defined medically as fewer than three stools per week and severe constipation as less than one stool per week.  One of the most common issues patients have following surgery is constipation.  Even if you have a regular bowel pattern at home, your normal regimen is likely to be disrupted due to multiple reasons following surgery.  Combination of anesthesia, postoperative narcotics, change in appetite and fluid intake all can affect your bowels.  In order to avoid complications following surgery, here are some recommendations in order to help you during your recovery period.  Colace (docusate) - Pick up an over-the-counter form of Colace or another stool softener and take twice a day as long as you are requiring postoperative pain medications.  Take with a full glass of water daily.  If you experience loose stools or diarrhea, hold the colace until you stool forms back up.  If your symptoms do not get better within 1 week or if they get worse, check with your doctor.  Dulcolax (  bisacodyl) - Pick up over-the-counter and take as directed by the product packaging as needed to assist with the movement of your bowels.  Take with a full glass of water.  Use this product as needed if not relieved by Colace only.   MiraLax (polyethylene glycol) - Pick up over-the-counter to have on hand.  MiraLax is a solution that will increase the amount of water in your bowels to assist with bowel movements.  Take as directed and can mix with a glass of water, juice, soda, coffee, or tea.  Take if you  go more than two days without a movement. Do not use MiraLax more than once per day. Call your doctor if you are still constipated or irregular after using this medication for 7 days in a row.  If you continue to have problems with postoperative constipation, please contact the office for further assistance and recommendations.  If you experience "the worst abdominal pain ever" or develop nausea or vomiting, please contact the office immediatly for further recommendations for treatment.  ITCHING  If you experience itching with your medications, try taking only a single pain pill, or even half a pain pill at a time.  You can also use Benadryl over the counter for itching or also to help with sleep.   TED HOSE STOCKINGS Wear the elastic stockings on both legs for three weeks following surgery during the day but you may remove then at night for sleeping.  MEDICATIONS See your medication summary on the After Visit Summary that the nursing staff will review with you prior to discharge.  You may have some home medications which will be placed on hold until you complete the course of blood thinner medication.  It is important for you to complete the blood thinner medication as prescribed by your surgeon.  Continue your approved medications as instructed at time of discharge.  PRECAUTIONS If you experience chest pain or shortness of breath - call 911 immediately for transfer to the hospital emergency department.  If you develop a fever greater that 101 F, purulent drainage from wound, increased redness or drainage from wound, foul odor from the wound/dressing, or calf pain - CONTACT YOUR SURGEON.                                                   FOLLOW-UP APPOINTMENTS Make sure you keep all of your appointments after your operation with your surgeon and caregivers. You should call the office at the above phone number and make an appointment for approximately two weeks after the date of your surgery or  on the date instructed by your surgeon outlined in the "After Visit Summary".  IF YOU ARE TRANSFERRED TO A SKILLED REHAB FACILITY If the patient is transferred to a skilled rehab facility following release from the hospital, a list of the current medications will be sent to the facility for the patient to continue.  When discharged from the skilled rehab facility, please have the facility set up the patient's [place] prior to being released. Also, the skilled facility will be responsible for providing the patient with their medications at time of release from the facility to include their pain medication, the muscle relaxants, and their blood thinner medication. If the patient is still at the rehab facility at time of the two week follow up  appointment, the skilled rehab facility will also need to assist the patient in arranging follow up appointment in our office and any transportation needs.  MAKE SURE YOU:   Understand these instructions.   Get help right away if you are not doing well or get worse.    Pick up stool softner and laxative for home use following surgery while on pain medications. Do not submerge incision under water. Please use good hand washing techniques while changing dressing each day. May shower starting three days after surgery. Please use a clean towel to pat the incision dry following showers. Continue to use ice for pain and swelling after surgery. Do not use any lotions or creams on the incision until instructed by your surgeon.    Information on my medicine - XARELTO (Rivaroxaban)  Why was Xarelto prescribed for you? Xarelto was prescribed for you to reduce the risk of blood clots forming after orthopedic surgery. The medical term for these abnormal blood clots is venous thromboembolism (VTE).  What do you need to know about xarelto ? Take your Xarelto ONCE DAILY at the same time every day. You may take it either with or without  food.  If you have difficulty swallowing the tablet whole, you may crush it and mix in applesauce just prior to taking your dose.  Take Xarelto exactly as prescribed by your doctor and DO NOT stop taking Xarelto without talking to the doctor who prescribed the medication.  Stopping without other VTE prevention medication to take the place of Xarelto may increase your risk of developing a clot.  After discharge, you should have regular check-up appointments with your healthcare provider that is prescribing your Xarelto.    What do you do if you miss a dose? If you miss a dose, take it as soon as you remember on the same day then continue your regularly scheduled once daily regimen the next day. Do not take two doses of Xarelto on the same day.   Important Safety Information A possible side effect of Xarelto is bleeding. You should call your healthcare provider right away if you experience any of the following: ? Bleeding from an injury or your nose that does not stop. ? Unusual colored urine (red or dark brown) or unusual colored stools (red or black). ? Unusual bruising for unknown reasons. ? A serious fall or if you hit your head (even if there is no bleeding).  Some medicines may interact with Xarelto and might increase your risk of bleeding while on Xarelto. To help avoid this, consult your healthcare provider or pharmacist prior to using any new prescription or non-prescription medications, including herbals, vitamins, non-steroidal anti-inflammatory drugs (NSAIDs) and supplements.  This website has more information on Xarelto: [email or web address]

## 2018-11-04 LAB — BASIC METABOLIC PANEL
ANION GAP: 6 (ref 5–15)
BUN: 20 mg/dL (ref 8–23)
CO2: 25 mmol/L (ref 22–32)
Calcium: 8.5 mg/dL — ABNORMAL LOW (ref 8.9–10.3)
Chloride: 105 mmol/L (ref 98–111)
Creatinine, Ser: 0.83 mg/dL (ref 0.44–1.00)
Glucose, Bld: 175 mg/dL — ABNORMAL HIGH (ref 70–99)
Potassium: 4.3 mmol/L (ref 3.5–5.1)
SODIUM: 136 mmol/L (ref 135–145)

## 2018-11-04 LAB — CBC
HEMATOCRIT: 34.6 % — AB (ref 36.0–46.0)
Hemoglobin: 10.2 g/dL — ABNORMAL LOW (ref 12.0–15.0)
MCH: 24.6 pg — ABNORMAL LOW (ref 26.0–34.0)
MCHC: 29.5 g/dL — ABNORMAL LOW (ref 30.0–36.0)
MCV: 83.4 fL (ref 80.0–100.0)
NRBC: 0 % (ref 0.0–0.2)
PLATELETS: 304 10*3/uL (ref 150–400)
RBC: 4.15 MIL/uL (ref 3.87–5.11)
RDW: 15.4 % (ref 11.5–15.5)
WBC: 8.7 10*3/uL (ref 4.0–10.5)

## 2018-11-04 NOTE — Progress Notes (Signed)
Complaints of pain controlled best with Oxycodone. Foley removed.

## 2018-11-04 NOTE — Evaluation (Signed)
Physical Therapy Evaluation Patient Details Name: Beth [name] MRN: [id number] DOB: [birth year] Today's Date: 11/04/2018   History of Present Illness  Pt is a 72 YO female admitted for R TK resection arthroplasty with placement of antibiotic spacer. Pt's R TKR complications include R TKR in 2015, I&D and polyexchange 2015, I&D and polyexchange 2016, reimplantation 2016, and abscess 2018. Pt with PMH of anemia, OA, cataracts, CKD III, CVA 2006, DM II, heart murmur, HLD, HTN. Surgical history includes carpal tunnel release and previously listed R knee surgeries.  Clinical Impression  Patient presents with decreased independence with mobility due to pain, decreased AROM, decreased activity tolerance and decreased balance with high fall risk.  Currently min A overall for mobility with short distance ambulation to door.  She previously used walker at home for household distances and wheelchair in community.  Lives with grandaughter who works, but reports will have other friends and family in to assist during this 2-3 month period while dealing with spacer and plans to go to [facility] rehab once knee is reimplanted.  PT to follow acutely.     Follow Up Recommendations Home health PT;Supervision/Assistance - 24 hour    Equipment Recommendations  3in1 (PT)    Recommendations for Other Services       Precautions / Restrictions Precautions Precautions: Fall Required Braces or Orthoses: Knee Immobilizer - Right Restrictions Weight Bearing Restrictions: No Other Position/Activity Restrictions: WBAT      Mobility  Bed Mobility Overal bed mobility: Needs Assistance Bed Mobility: Supine to Sit     Supine to sit: Min assist;HOB elevated     General bed mobility comments: assist for R LE  Transfers Overall transfer level: Needs assistance Equipment used: Rolling walker (2 wheeled) Transfers: Sit to/from Stand Sit to Stand: From elevated surface;Min assist         General  transfer comment: reports has lift chair and hospital bed at home so elevated bed for sit >stand  Ambulation/Gait Ambulation/Gait assistance: Min assist Gait Distance (Feet): 20 Feet Assistive device: Rolling walker (2 wheeled) Gait Pattern/deviations: Step-to pattern;Decreased stride length;Shuffle;Trunk flexed;Antalgic     General Gait Details: increased time, difficulty with weight tolerance, chair followed for safety as reports at times L knee buckles  Stairs            Wheelchair Mobility    Modified Rankin (Stroke Patients Only)       Balance Overall balance assessment: Needs assistance   Sitting balance-[name]: Fair     Standing balance support: Bilateral upper extremity supported Standing balance-[name]: Poor Standing balance comment: UE support for balance due to pain, limited weight tolerance R                              Pertinent Vitals/Pain Pain Assessment: Faces Faces Pain Scale: Hurts whole lot Pain Location: R knee with weight bearing and movement Pain Descriptors / Indicators: Operative site guarding;Grimacing;Moaning;Sore Pain Intervention(s): Monitored during session;Repositioned;Ice applied    Home Living Family/patient expects to be discharged to:: Private residence Living Arrangements: Other relatives(grandaughter) Available Help at Discharge: Family;Available 24 hours/day Type of Home: House Home Access: Ramped entrance     Home Layout: One level Home Equipment: Bedside commode;Walker - 2 wheels;Cane - single point;Shower seat;Wheelchair - manual      Prior Function Level of Independence: Needs assistance   Gait / Transfers Assistance Needed: walker in the home for 10-15 ft distances, uses w/c to  get groceries with granddaughter            Hand Dominance   Dominant Hand: Right    Extremity/Trunk Assessment   Upper Extremity Assessment Upper Extremity Assessment: Overall WFL for tasks assessed     Lower Extremity Assessment Lower Extremity Assessment: RLE deficits/detail;LLE deficits/detail RLE Deficits / Details: ankle AROM WFL, able to perform quad set, limited extension to about 20, flexion NT LLE Deficits / Details: arthritic changes noted in knee joint, moves about 65 degrees in supine with some crepitus noted       Communication   Communication: No difficulties  Cognition Arousal/Alertness: Awake/alert Behavior During Therapy: WFL for tasks assessed/performed Overall Cognitive Status: Within Functional Limits for tasks assessed                                        General Comments      Exercises Total Joint Exercises Ankle Circles/Pumps: AROM;10 reps;Both;Supine Quad Sets: AROM;5 reps;Right;Supine Heel Slides: AROM;5 reps;Left;Supine   Assessment/Plan    PT Assessment Patient needs continued PT services  PT Problem List Decreased strength;Decreased mobility;Decreased range of motion;Decreased knowledge of precautions;Decreased activity tolerance;Decreased balance;Decreased knowledge of use of DME;Pain       PT Treatment Interventions DME instruction;Therapeutic activities;Therapeutic exercise;Gait training;Balance training;Functional mobility training;Patient/family education    PT Goals (Current goals can be found in the Care Plan section)  Acute Rehab PT Goals Patient Stated Goal: to go home PT Goal Formulation: With patient Time For Goal Achievement: 11/11/18 Potential to Achieve Goals: Good    Frequency Min 5X/week   Barriers to discharge        Co-evaluation               AM-PAC PT "6 Clicks" Mobility  Outcome Measure Help needed turning from your back to your side while in a flat bed without using bedrails?: A Lot Help needed moving from lying on your back to sitting on the side of a flat bed without using bedrails?: A Little Help needed moving to and from a bed to a chair (including a wheelchair)?: A Little Help  needed standing up from a chair using your arms (e.g., wheelchair or bedside chair)?: A Little Help needed to walk in hospital room?: A Little Help needed climbing 3-5 steps with a railing? : Total 6 Click Score: 15    End of Session Equipment Utilized During Treatment: Gait belt;Right knee immobilizer Activity Tolerance: Patient limited by pain Patient left: with call bell/phone within reach;in chair;with chair alarm set Nurse Communication: Mobility status PT Visit Diagnosis: Difficulty in walking, not elsewhere classified (R26.2);Pain Pain - Right/Left: Right Pain - part of body: Knee    Time: 4627-0350 PT Time Calculation (min) (ACUTE ONLY): 27 min   Charges:   PT Evaluation $PT Eval Moderate Complexity: 1 Mod PT Treatments $Gait Training: 8-22 mins        Magda Kiel, [place] [facility] [phone] 11/04/2018   Reginia Naas 11/04/2018, 11:31 AM

## 2018-11-04 NOTE — Progress Notes (Signed)
   Subjective: 1 Day Post-Op Procedure(s) (LRB): Right knee resection arthroplasty; antibiotic spacer (Right) Patient reports pain as moderate.   We will start therapy today.  Plan is to go Home after hospital stay.  Objective: Vital signs in last 24 hours: Temp:  [97.4 F (36.3 C)-98 F (36.7 C)] 98 F (36.7 C) (11/28 0430) Pulse Rate:  [60-72] 60 (11/28 0430) Resp:  [13-26] 16 (11/28 0430) BP: (139-181)/(79-98) 155/86 (11/28 0430) SpO2:  [94 %-100 %] 99 % (11/28 0430) Weight:  [98 kg] 98 kg (11/27 1751)  Intake/Output from previous day:  Intake/Output Summary (Last 24 hours) at 11/04/2018 0856 Last data filed at 11/04/2018 0600 Gross per 24 hour  Intake 2401.12 ml  Output 1480 ml  Net 921.12 ml    Intake/Output this shift: No intake/output data recorded.  Labs: Recent Labs    11/04/18 0529  HGB 10.2*   Recent Labs    11/04/18 0529  WBC 8.7  RBC 4.15  HCT 34.6*  PLT 304   Recent Labs    11/04/18 0529  NA 136  K 4.3  CL 105  CO2 25  BUN 20  CREATININE 0.83  GLUCOSE 175*  CALCIUM 8.5*   No results for input(s): LABPT, INR in the last 72 hours.  EXAM General - Patient is Alert Extremity - Neurologically intact Neurovascular intact Dorsiflexion/Plantar flexion intact Dressing - dressing C/D/I Motor Function - intact, moving foot and toes well on exam.    Past Medical History:  Diagnosis Date  . Anemia   . Arthritis    Knee both knees  . Blood transfusion without reported diagnosis 2012   anemia;pt denies transfusion stated was only on iron tablet  . Cataract    left  . CKD (chronic kidney disease), stage III ([place])   . CVA (cerebral infarction)    2006  . Diabetes mellitus without complication ([place])   . Family history of anesthesia complication    sister very slow to awaken after anesthesia;severe vomiting   . Gout    left elbow  . Heart murmur   . 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 08-09-14 Right eye  . Hyperlipidemia   .  Hypertension   . Nocturia    3-4 times per night  . Stroke ([place]) 2006   x 1 no deficits noted     Assessment/Plan: 1 Day Post-Op Procedure(s) (LRB): Right knee resection arthroplasty; antibiotic spacer (Right) Active Problems:   Septic arthritis of knee, right (HCC)   Advance diet Up with therapy Continue ABX therapy due to Culture taken of surgical site during joint revision  ID consult tomorrow for antibiotic recommendations for PJI PICC line tomorrow Await culture results. Had purulent fluid in joint and in abscess outside joint Keep drains in until tomorrow  DVT Prophylaxis - Xarelto Weight-Bearing as tolerated to right leg D/C O2 and Pulse OX and try on Room [employer] Tashia Leiterman 11/04/2018, 8:56 AM

## 2018-11-04 NOTE — Op Note (Signed)
Beth [name], Beth [name] MEDICAL RECORD [id number] ACCOUNT [id number] DATE OF BIRTH:1946-08-26 FACILITY: [facility] LOCATION: [id number] PHYSICIAN:Ariea Rochin Zella Ball, MD  OPERATIVE REPORT  DATE OF PROCEDURE:  11/03/2018  PREOPERATIVE DIAGNOSIS:  Infected right total knee arthroplasty.  POSTOPERATIVE DIAGNOSIS:  Infected right total knee arthroplasty.  PROCEDURE:  Right knee resection arthroplasty with placement of antibiotic spacer.  SURGEON:  [name], MD  ASSISTANT:  Theresa Duty, PA-C  ANESTHESIA:  General with adductor canal block.  ESTIMATED BLOOD LOSS:  50 mL.  DRAIN:  Hemovac x1.  TOURNIQUET TIME:  106 minutes at 300 mmHg.  COMPLICATIONS:  None.  CONDITION:  Stable to recovery.  BRIEF CLINICAL NOTE:  The patient is a 72 year old female with complex history in regard to her right knee.  She has an infected total knee arthroplasty revision, and now she presents for resection arthroplasty with placement of antibiotic spacer.  PROCEDURE IN DETAIL:  After successful administration of adductor canal block and general anesthetic, a tourniquet was placed on her right thigh and right lower extremity, prepped and draped in the usual sterile fashion.  The extremity was wrapped in an  Esmarch and tourniquet inflated to 300 mmHg.  The midline incision was made with a 10 blade through subcutaneous tissue.  A large pocket of pus was identified anterior to the patellar tendon.  I sent for Gram stain, culture and sensitivity.  We made an  arthrotomy, and a similar fluid was present in the joint.  I debrided that pocket from in the area of the anterior patellar, removing all of the affected tissue.  Once in the joint, removed all the scar tissue and did a thorough synovectomy.  Soft tissue  over the proximal medial tibia subperiosteally elevated to the joint line with a knife and into the semimembranosus bursa with a Cobb elevator.  Soft tissue laterally was elevated with attention being paid  to avoiding the patellar tendon on the tibial  tubercle.  The patella was scarred and difficult to evert, so I did a quadriceps snip in order to evert it, and that led [place] to safely evert it without avulsing the tendon off of the tibial tubercle.  We then flexed the knee 92 x 90 degrees and I removed  the tibial polyethylene.  The femoral component had subsided and tilted into extension.  I was easily able to remove the femoral component with no bone loss.  Retractors were then placed around the tibia, and using an oscillating saw, the interface between the tibial component and bone was disrupted.  Osteotomes were then used to further disrupt the interface.  I was able to remove the tibial component without  difficulty.  The tibial sleeve was intact, and I had to use osteotomes to remove the cement around the sleeve and then I removed the sleeve.  The cement was then removed distally in the tibial canal.  The cement restrictor was also removed.  The joint  was then irrigated with 3 L of saline using pulsatile lavage.  Four batches of gentamicin impregnated cement were mixed, and a static spacer was created in the extension space.  I placed a cement extension to go into the femoral and tibial canal for more  stability.  As it was hardening, the spacer was placed into the knee joint and the extension space, and it was very stable in extension.  The wound was further irrigated with another liter of saline using pulsatile lavage.  The quadriceps snip was  closed with interrupted #1 PDS  suture.  The arthrotomy was then closed over a Hemovac drain with a running 0 Stratafix suture.  The tourniquet was released for a total time of 106 minutes.  The subcu was then closed with interrupted 2-0 Vicryl over  another limb of the Hemovac drain.  The skin was closed with staples.  The incision was cleaned and dried, and a bulky sterile dressing applied.  The drain was hooked to suction, and she was placed into a knee  immobilizer, awakened and transported to  recovery in stable condition.  Note that a surgical assistant is a medical necessity for this procedure to do it in a safe and expeditious manner.  Surgical assistance is necessary for retraction of vital ligaments and neurovascular structures and for proper placement of the limb for  safe removal of the old components and proper placement of the spacer.  LN/NUANCE  D:11/03/2018 T:11/04/2018 JOB:[id number]

## 2018-11-05 ENCOUNTER — Inpatient Hospital Stay: Payer: Self-pay

## 2018-11-05 ENCOUNTER — Encounter (HOSPITAL_COMMUNITY): Payer: Self-pay

## 2018-11-05 DIAGNOSIS — M009 Pyogenic arthritis, unspecified: Secondary | ICD-10-CM

## 2018-11-05 DIAGNOSIS — Z888 Allergy status to other drugs, medicaments and biological substances status: Secondary | ICD-10-CM

## 2018-11-05 DIAGNOSIS — E1122 Type 2 diabetes mellitus with diabetic chronic kidney disease: Secondary | ICD-10-CM

## 2018-11-05 DIAGNOSIS — T8453XA Infection and inflammatory reaction due to internal right knee prosthesis, initial encounter: Principal | ICD-10-CM

## 2018-11-05 DIAGNOSIS — N189 Chronic kidney disease, unspecified: Secondary | ICD-10-CM

## 2018-11-05 DIAGNOSIS — Z886 Allergy status to analgesic agent status: Secondary | ICD-10-CM

## 2018-11-05 DIAGNOSIS — Z91013 Allergy to seafood: Secondary | ICD-10-CM

## 2018-11-05 LAB — CBC
HEMATOCRIT: 31.6 % — AB (ref 36.0–46.0)
Hemoglobin: 9.4 g/dL — ABNORMAL LOW (ref 12.0–15.0)
MCH: 25.1 pg — ABNORMAL LOW (ref 26.0–34.0)
MCHC: 29.7 g/dL — ABNORMAL LOW (ref 30.0–36.0)
MCV: 84.5 fL (ref 80.0–100.0)
PLATELETS: 275 10*3/uL (ref 150–400)
RBC: 3.74 MIL/uL — AB (ref 3.87–5.11)
RDW: 15.6 % — AB (ref 11.5–15.5)
WBC: 7.5 10*3/uL (ref 4.0–10.5)
nRBC: 0 % (ref 0.0–0.2)

## 2018-11-05 LAB — BASIC METABOLIC PANEL
Anion gap: 7 (ref 5–15)
BUN: 22 mg/dL (ref 8–23)
CO2: 25 mmol/L (ref 22–32)
Calcium: 8.3 mg/dL — ABNORMAL LOW (ref 8.9–10.3)
Chloride: 106 mmol/L (ref 98–111)
Creatinine, Ser: 0.86 mg/dL (ref 0.44–1.00)
GLUCOSE: 123 mg/dL — AB (ref 70–99)
POTASSIUM: 4.2 mmol/L (ref 3.5–5.1)
Sodium: 138 mmol/L (ref 135–145)

## 2018-11-05 MED ORDER — TRAMADOL HCL 50 MG PO TABS: 50.0000 mg | ORAL_TABLET | Freq: Four times a day (QID) | ORAL | 0 refills | Status: DC | PRN

## 2018-11-05 MED ORDER — OXYCODONE HCL 5 MG PO TABS: 5.0000 mg | ORAL_TABLET | Freq: Four times a day (QID) | ORAL | 0 refills | Status: DC | PRN

## 2018-11-05 MED ORDER — SODIUM CHLORIDE 0.9 % IV SOLN
2.0000 g | INTRAVENOUS | Status: DC
  Administered 2018-11-05: 2 g via INTRAVENOUS
  Filled 2018-11-05: qty 20

## 2018-11-05 MED ORDER — RIVAROXABAN 10 MG PO TABS: 10.0000 mg | ORAL_TABLET | Freq: Every day | ORAL | 0 refills | Status: DC

## 2018-11-05 MED ORDER — METHOCARBAMOL 500 MG PO TABS: 500.0000 mg | ORAL_TABLET | Freq: Four times a day (QID) | ORAL | 0 refills | Status: DC | PRN

## 2018-11-05 NOTE — Consult Note (Signed)
Date of Admission:  11/03/2018          Reason for Consult: Infected total knee status post explantation and placement of antibiotic spacers   Referring Provider: Dr Maureen Ralphs   Assessment:  1. Infected TKA sp multiple surgeries  2. CKD 3. DM  Plan:  1. Start vancomycin and ceftriaxone 2. Follow-up intraoperative cultures 3. Place PICC line if okay with nephrology 4. Check baseline sed rate and C-reactive protein postoperatively 5. We will treat her with 6 weeks hopefully with targeted antimicrobial therapy but if not then likely with vancomycin and ceftriaxone.  Active Problems:   Septic arthritis of knee, right (HCC)   Scheduled Meds: . amLODipine  10 mg Oral QPM  . atorvastatin  20 mg Oral QPM  . docusate sodium  100 mg Oral BID  . ferrous sulfate  325 mg Oral BID WC  . hydrALAZINE  100 mg Oral TID  . irbesartan  300 mg Oral Daily  . nebivolol  20 mg Oral Daily  . rivaroxaban  10 mg Oral Q breakfast   Continuous Infusions: . sodium chloride Stopped (11/04/18 1145)  . methocarbamol (ROBAXIN) IV Stopped (11/05/18 0803)  . vancomycin Stopped (11/05/18 0100)   PRN Meds:.bisacodyl, diphenhydrAMINE, menthol-cetylpyridinium **OR** phenol, methocarbamol **OR** methocarbamol (ROBAXIN) IV, metoCLOPramide **OR** metoCLOPramide (REGLAN) injection, morphine injection, ondansetron **OR** ondansetron (ZOFRAN) IV, oxyCODONE, oxyCODONE, polyethylene glycol, sodium phosphate, traMADol  HPI: MONIE SHERE is a 72 y.o. 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  She apparently originally had right knee total arthroplasty in July 2015.  In the interim she had an I&D in October 2015 with poly-exchange followed by antibiotics.  She then had in 2016 a right knee resection arthroplasty with placement of antibiotic spacers followed by antibiotics and followed by reimplantation of new prosthetic knee.  Unfortunately she is again had failure and of  the attempts to eradicate infection in his knee and her prosthetic knee has been removed and antibiotic spacer placed  Is my understanding that no organism has been isolated in the past.  Does not appear that we were ever involved with her care before thing when I can see and less it was in the paper record I cannot see any notes over the last several years in epic.  We will follow-up her intraoperative cultures in the interim I am starting her on vancomycin and ceftriaxone.  I would be reluctant to consider reimplanting any in this patient.  Perhaps we could follow her 6-week course with some oral antibiotics as well if there is some anxiety about any residual infection associated with the prior prosthesis such as in the bone.       Review of Systems: Review of Systems  Constitutional: Negative for chills, diaphoresis, fever, malaise/fatigue and weight loss.  HENT: Negative for congestion, hearing loss, sore throat and tinnitus.   Eyes: Negative for blurred vision and double vision.  Respiratory: Negative for cough, sputum production, shortness of breath and wheezing.   Cardiovascular: Negative for chest pain, palpitations and leg swelling.  Gastrointestinal: Negative for abdominal pain, blood in stool, constipation, diarrhea, heartburn, melena, nausea and vomiting.  Genitourinary: Negative for dysuria, flank pain and hematuria.  Musculoskeletal: Positive for joint pain and myalgias. Negative for back pain and falls.  Skin: Negative for itching and rash.  Neurological: Negative for dizziness, sensory change, focal weakness, loss of consciousness, weakness and headaches.  Endo/Heme/Allergies: Does not bruise/bleed easily.  Psychiatric/Behavioral: Negative  for depression, memory loss and suicidal ideas. The patient is not nervous/anxious.     Past Medical History:  Diagnosis Date  . Anemia   . Arthritis    Knee both knees  . Blood transfusion without reported diagnosis 2012    anemia;pt denies transfusion stated was only on iron tablet  . Cataract    left  . CKD (chronic kidney disease), stage III ([place])   . CVA (cerebral infarction)    2006  . Diabetes mellitus without complication ([place])   . Family history of anesthesia complication    sister very slow to awaken after anesthesia;severe vomiting   . Gout    left elbow  . Heart murmur   . 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 08-09-14 Right eye  . Hyperlipidemia   . Hypertension   . Nocturia    3-4 times per night  . Stroke [facility]) 2006   x 1 no deficits noted     Social History   Tobacco Use  . Smoking status: Never Smoker  . Smokeless tobacco: Never Used  Substance Use Topics  . Alcohol use: No  . Drug use: No    Family History  Problem Relation Age of Onset  . Ovarian cancer Mother   . Cancer Mother   . Diabetes Son   . Diabetes Son   . Peripheral vascular disease Father        with amputation of both legs  . Hypertension Father   . Heart disease Brother   . Kidney disease Daughter   . Colon cancer Neg Hx   . Esophageal cancer Neg Hx   . Stomach cancer Neg Hx   . Rectal cancer Neg Hx    Allergies  Allergen Reactions  . Aleve [Naproxen Sodium] Other (See Comments)    Heart races  . Asa [Aspirin] Other (See Comments)    Nose bleeding  . Clonidine Derivatives Other (See Comments)    Dizziness and weakness  . Shellfish Allergy Nausea And Vomiting    OBJECTIVE: Blood pressure (!) 180/82, pulse 68, temperature 98.5 F (36.9 C), temperature source Oral, resp. rate 16, height 5\' 5"  (1.651 m), weight 98 kg, SpO2 91 %.  Physical Exam  Constitutional: She is oriented to person, place, and time. No distress.  HENT:  Head: Normocephalic and atraumatic.  Right Ear: External ear normal.  Left Ear: External ear normal.  Nose: Nose normal.  Mouth/Throat: Oropharynx is clear and moist. No oropharyngeal exudate.  Eyes: Conjunctivae and EOM are normal. No scleral icterus.  Neck:  Normal range of motion. Neck supple.  Cardiovascular: Normal rate and regular rhythm.  Pulmonary/Chest: Effort normal. No respiratory distress. She has no wheezes. She has no rales.  Abdominal: Soft. Bowel sounds are normal. She exhibits no distension.  Musculoskeletal: Normal range of motion. She exhibits no edema or tenderness.  Lymphadenopathy:    She has no cervical adenopathy.  Neurological: She is alert and oriented to person, place, and time. Coordination normal.  Skin: Skin is warm and dry. No rash noted. She is not diaphoretic. No erythema. No pallor.  Psychiatric: She has a normal mood and affect. Her behavior is normal. Judgment and thought content normal.   Right knee with bandage Lab Results Lab Results  Component Value Date   WBC 7.5 11/05/2018   HGB 9.4 (L) 11/05/2018   HCT 31.6 (L) 11/05/2018   MCV 84.5 11/05/2018   PLT 275 11/05/2018    Lab Results  Component Value Date  CREATININE 0.86 11/05/2018   BUN 22 11/05/2018   NA 138 11/05/2018   K 4.2 11/05/2018   CL 106 11/05/2018   CO2 25 11/05/2018    Lab Results  Component Value Date   ALT 11 10/27/2018   AST 17 10/27/2018   ALKPHOS 95 10/27/2018   BILITOT 1.0 10/27/2018     Microbiology: Recent Results (from the past 240 hour(s))  Surgical pcr screen     Status: None   Collection Time: 10/27/18  2:50 PM  Result Value Ref Range Status   MRSA, PCR NEGATIVE NEGATIVE Final   Staphylococcus aureus NEGATIVE NEGATIVE Final    Comment: (NOTE) The Xpert SA Assay (FDA approved for NASAL specimens in patients 19 years of age and older), is one component of a comprehensive surveillance program. It is not intended to diagnose infection nor to guide or monitor treatment. Performed at [facility], [place] [address], [place], [place] [address]   Body fluid culture     Status: None (Preliminary result)   Collection Time: 11/03/18  1:25 PM  Result Value Ref Range Status   Specimen Description  FLUID SYNOVIAL RIGHT KNEE  Final   Special Requests   Final    NONE Performed at [facility] [address] [place], [place] [address]    Gram Stain   Final    MODERATE WBC PRESENT, PREDOMINANTLY PMN NO ORGANISMS SEEN    Culture   Final    RARE PSEUDOMONAS AERUGINOSA SUSCEPTIBILITIES TO FOLLOW Performed at [facility], [place] [address], [place], [place] [address]    Report Status PENDING  Incomplete  Anaerobic culture     Status: None (Preliminary result)   Collection Time: 11/03/18  1:25 PM  Result Value Ref Range Status   Specimen Description FLUID SYNOVIAL RIGHT KNEE  Final   Special Requests SWAB  Final   Culture   Final    NO GROWTH < 24 HOURS NO ANAEROBES ISOLATED; CULTURE IN PROGRESS FOR 5 DAYS Performed at [facility], [place] [address] [place], [place] [address]    Report Status PENDING  Incomplete    Alcide Evener, [place] [facility] [phone] pager  11/05/2018, 6:11 PM

## 2018-11-05 NOTE — Anesthesia Postprocedure Evaluation (Signed)
Anesthesia Post Note  Patient: RMANI KAPUSTA  Procedure(s) Performed: Right knee resection arthroplasty; antibiotic spacer (Right Knee)     Patient location during evaluation: PACU Anesthesia Type: General Level of consciousness: awake and alert Pain management: pain level controlled Vital Signs Assessment: post-procedure vital signs reviewed and stable Respiratory status: spontaneous breathing, nonlabored ventilation, respiratory function stable and patient connected to nasal cannula oxygen Cardiovascular status: blood pressure returned to baseline and stable Postop Assessment: no apparent nausea or vomiting Anesthetic complications: no    Last Vitals:  Vitals:   11/05/18 1205 11/05/18 1403  BP:  (!) 180/82  Pulse:  68  Resp:  16  Temp: 36.7 C 36.9 C  SpO2:  91%    Last Pain:  Vitals:   11/05/18 1403  TempSrc: Oral  PainSc:                  [place] S

## 2018-11-05 NOTE — Progress Notes (Signed)
Physical Therapy Treatment Patient Details Name: Beth [name] MRN: [id number] DOB: December 13, 1945 Today's Date: 11/05/2018    History of Present Illness Pt is a 72 YO female admitted for R TK resection arthroplasty with placement of antibiotic spacer. Pt's R TKR complications include R TKR in 2015, I&D and polyexchange 2015, I&D and polyexchange 2016, reimplantation 2016, and abscess 2018. Pt with PMH of anemia, OA, cataracts, CKD III, CVA 2006, DM II, heart murmur, HLD, HTN. Surgical history includes carpal tunnel release and previously listed R knee surgeries.    PT Comments    POD # 2 am session Pt in bed with KI and ICE.  Removed ICE and reapplied KI.  Assisted OOB to amb a limited distance due to R knee pain and L knee buckling.  Performed AP and knee presses but limited by pain.  Reapplied ICE and positioned to comfort.   Pt plans to D/C to home with family support.    Follow Up Recommendations  Home health PT;Supervision/Assistance - 24 hour     Equipment Recommendations       Recommendations for Other Services       Precautions / Restrictions Precautions Precautions: Fall Required Braces or Orthoses: Knee Immobilizer - Right Knee Immobilizer - Right: On at all times(orders read KI all times except for PT and CPM) Restrictions Weight Bearing Restrictions: No Other Position/Activity Restrictions: WBAT    Mobility  Bed Mobility Overal bed mobility: Needs Assistance Bed Mobility: Supine to Sit     Supine to sit: Min assist;HOB elevated     General bed mobility comments: assist for R LE and increased time to complete scooting to EOB    Transfers Overall transfer level: Needs assistance Equipment used: Rolling walker (2 wheeled) Transfers: Sit to/from Stand Sit to Stand: From elevated surface;Min assist         General transfer comment: reports has lift chair and hospital bed at home so elevated bed for sit >stand  Ambulation/Gait Ambulation/Gait assistance: Min  assist Gait Distance (Feet): 25 Feet Assistive device: Rolling walker (2 wheeled) Gait Pattern/deviations: Step-to pattern;Decreased stride length;Shuffle;Trunk flexed;Antalgic Gait velocity: decreased    General Gait Details: increased time, difficulty with weight tolerance due to pain, chair followed for safety as reports at times L knee buckles   Stairs             Wheelchair Mobility    Modified Rankin (Stroke Patients Only)       Balance                                            Cognition Arousal/Alertness: Awake/alert Behavior During Therapy: WFL for tasks assessed/performed Overall Cognitive Status: Within Functional Limits for tasks assessed                                        Exercises  10 reps AP and knee presses.    General Comments        Pertinent Vitals/Pain Pain Assessment: Faces Faces Pain Scale: Hurts whole lot Pain Location: R knee with weight bearing and movement Pain Descriptors / Indicators: Operative site guarding;Grimacing;Moaning;Sore Pain Intervention(s): Monitored during session;Repositioned;Ice applied;Premedicated before session    Home Living  Prior Function            PT Goals (current goals can now be found in the care plan section) Progress towards PT goals: Progressing toward goals    Frequency    Min 5X/week      PT Plan Current plan remains appropriate    Co-evaluation              AM-PAC PT "6 Clicks" Mobility   Outcome Measure  Help needed turning from your back to your side while in a flat bed without using bedrails?: A Lot Help needed moving from lying on your back to sitting on the side of a flat bed without using bedrails?: A Lot Help needed moving to and from a bed to a chair (including a wheelchair)?: A Lot Help needed standing up from a chair using your arms (e.g., wheelchair or bedside chair)?: A Lot Help needed to walk in  hospital room?: A Lot Help needed climbing 3-5 steps with a railing? : Total 6 Click Score: 11    End of Session Equipment Utilized During Treatment: Gait belt;Right knee immobilizer Activity Tolerance: No increased pain Patient left: with call bell/phone within reach;in chair;with chair alarm set Nurse Communication: Mobility status PT Visit Diagnosis: Difficulty in walking, not elsewhere classified (R26.2);Pain Pain - Right/Left: Right Pain - part of body: Knee     Time: 1045-1110 PT Time Calculation (min) (ACUTE ONLY): 25 min  Charges:  $Gait Training: 8-22 mins $Therapeutic Activity: 8-22 mins                     Rica Koyanagi  PTA Acute  Rehabilitation Services Pager      [phone] Office      [phone]

## 2018-11-05 NOTE — Progress Notes (Signed)
   Subjective: 2 Days Post-Op Procedure(s) (LRB): Right knee resection arthroplasty; antibiotic spacer (Right) Patient reports pain as mild.   Patient seen in rounds for Dr. Wynelle Link. Patient is well, and has had no acute complaints or problems other than soreness in the right knee. Voiding without difficulty and positive flatus. Denies chest pain, SOB, or calf pain. Plan is to go home after hospital stay.  Objective: Vital signs in last 24 hours: Temp:  [97.5 F (36.4 C)-100.1 F (37.8 C)] 100.1 F (37.8 C) (11/29 0655) Pulse Rate:  [66-71] 71 (11/29 0655) Resp:  [16-18] 16 (11/29 0655) BP: (128-173)/(59-87) 165/81 (11/29 0655) SpO2:  [96 %-99 %] 96 % (11/29 0655)  Intake/Output from previous day:  Intake/Output Summary (Last 24 hours) at 11/05/2018 0837 Last data filed at 11/05/2018 0200 Gross per 24 hour  Intake 4486.53 ml  Output 500 ml  Net 3986.53 ml    Labs: Recent Labs    11/04/18 0529 11/05/18 0520  HGB 10.2* 9.4*   Recent Labs    11/04/18 0529 11/05/18 0520  WBC 8.7 7.5  RBC 4.15 3.74*  HCT 34.6* 31.6*  PLT 304 275   Recent Labs    11/04/18 0529 11/05/18 0520  NA 136 138  K 4.3 4.2  CL 105 106  CO2 25 25  BUN 20 22  CREATININE 0.83 0.86  GLUCOSE 175* 123*  CALCIUM 8.5* 8.3*   Exam: General - Patient is Alert and Oriented Extremity - Neurologically intact Neurovascular intact Sensation intact distally Dorsiflexion/Plantar flexion intact Dressing/Incision - clean, dry, no drainage Motor Function - intact, moving foot and toes well on exam.   Past Medical History:  Diagnosis Date  . Anemia   . Arthritis    Knee both knees  . Blood transfusion without reported diagnosis 2012   anemia;pt denies transfusion stated was only on iron tablet  . Cataract    left  . CKD (chronic kidney disease), stage III ([place])   . CVA (cerebral infarction)    2006  . Diabetes mellitus without complication ([place])   . Family history of anesthesia complication     sister very slow to awaken after anesthesia;severe vomiting   . Gout    left elbow  . Heart murmur   . 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 08-09-14 Right eye  . Hyperlipidemia   . Hypertension   . Nocturia    3-4 times per night  . Stroke ([name]) 2006   x 1 no deficits noted     Assessment/Plan: 2 Days Post-Op Procedure(s) (LRB): Right knee resection arthroplasty; antibiotic spacer (Right) Active Problems:   Septic arthritis of knee, right (HCC)  Estimated body mass index is 35.95 kg/m as calculated from the following:   Height as of this encounter: 5\' 5"  (1.651 m).   Weight as of this encounter: 98 kg. Up with therapy  DVT Prophylaxis - Xarelto Weight-bearing as tolerated  Intra-op gram-stain showed predominantly PMNs, cultures show no growth at 12 hours.  Consulted ID for long-term abx management Order placed for PICC line Hemovac drain pulled without difficulty and dressing changed  Plan for discharge either tomorrow or Sunday pending progress with therapy and once abx plan is established.  Theresa Duty, PA-C Orthopedic Surgery 11/05/2018, 8:37 AM

## 2018-11-05 NOTE — Care Management Note (Signed)
Case Management Note  Patient Details  Name: Beth [name] MRN: [id number] Date of Birth: [birth year]  Subjective/Objective:     Spoke with patient at bedside. Plan to go home at d/c. Doesn't remember the [facility] agency she used previously but is calling to find out. Provided her with the CMS list for [facility] agencies.                Action/Plan: Will f/u with her later to see who she wants. Also awaiting ID consult for home antibiotics  Expected Discharge Date:  11/05/18               Expected Discharge Plan:     In-House Referral:     Discharge planning Services     Post Acute Care Choice:    Choice offered to:     DME Arranged:    DME Agency:     HH Arranged:    HH Agency:     Status of Service:     If discussed at [employer], dates discussed:    Additional Comments:  Guadalupe Maple, RN 11/05/2018, 11:23 AM

## 2018-11-06 LAB — CBC
HCT: 31 % — ABNORMAL LOW (ref 36.0–46.0)
Hemoglobin: 9.3 g/dL — ABNORMAL LOW (ref 12.0–15.0)
MCH: 24.7 pg — ABNORMAL LOW (ref 26.0–34.0)
MCHC: 30 g/dL (ref 30.0–36.0)
MCV: 82.2 fL (ref 80.0–100.0)
Platelets: 289 10*3/uL (ref 150–400)
RBC: 3.77 MIL/uL — ABNORMAL LOW (ref 3.87–5.11)
RDW: 15.6 % — AB (ref 11.5–15.5)
WBC: 7.4 10*3/uL (ref 4.0–10.5)
nRBC: 0 % (ref 0.0–0.2)

## 2018-11-06 LAB — BODY FLUID CULTURE

## 2018-11-06 LAB — HIV ANTIBODY (ROUTINE TESTING W REFLEX): HIV SCREEN 4TH GENERATION: NONREACTIVE

## 2018-11-06 LAB — C-REACTIVE PROTEIN: CRP: 8.1 mg/dL — ABNORMAL HIGH (ref <1.0)

## 2018-11-06 LAB — SEDIMENTATION RATE: Sed Rate: 73 mm/hr — ABNORMAL HIGH (ref 0–22)

## 2018-11-06 MED ORDER — SODIUM CHLORIDE 0.9% FLUSH
10.0000 mL | INTRAVENOUS | Status: DC | PRN
  Administered 2018-11-09 (×2): 10 mL
  Filled 2018-11-06 (×2): qty 40

## 2018-11-06 MED ORDER — SODIUM CHLORIDE 0.9 % IV SOLN
2.0000 g | Freq: Three times a day (TID) | INTRAVENOUS | Status: DC
  Administered 2018-11-06 – 2018-11-09 (×10): 2 g via INTRAVENOUS
  Filled 2018-11-06 (×11): qty 2

## 2018-11-06 NOTE — Progress Notes (Signed)
      INFECTIOUS DISEASE ATTENDING ADDENDUM:   Date: 11/06/2018  Patient name: Beth [name]  Medical record number: [id number]  Date of birth: 1946/01/31   Patient is growing a Pseudomonas from her operative culture.  I have discontinued the vancomycin and started cefepime we will have sensitivities tomorrow I suspect  And on treating her with 6 weeks of IV antibiotics potentially followed by orals but that would only be possible if this is a fluoroquinolone sensitive organism    Rhina Brack[place] 11/06/2018, 2:28 PM

## 2018-11-06 NOTE — Care Management Note (Addendum)
Case Management Note  Patient Details  Name: Beth [name] MRN: [id number] Date of Birth: October 21, 1946  Subjective/Objective:   Right knee resection arthroplasty, antibiotic spacer, plan dc home with IV abx                 Action/Plan: Spoke to pt at bedside. Offered choice for HH/CMS [facility] list provided. Pt agreeable to [facility], states her dtr and family will assist with giving IV abx. She has RW, and wheelchair at home. Requesting 3n1 bedside commode. Contacted [facility] for 3n1 bedside commode to be delivered to room prior to dc. Contacted [facility] and they utilize [facility] pharmacy for IV abx. Explained possible dc home tomorrow with HHRN, PT and IV abx. Will need HHRN, and PT orders with F2F and OPAT orders.  Expected Discharge Date: 11/07/2018              Expected Discharge Plan:  [place]  In-House Referral:  Clinical Social Work  Discharge planning Services  CM Consult  Post Acute Care Choice:  Home Health Choice offered to:  Patient  DME Arranged:  3-N-1 DME Agency:  [place]:  PT, RN, IV Antibiotics HH Agency:  [facility] (formerly [employer])  Status of Service:  In process, will continue to follow  If discussed at Long Length of Stay Meetings, dates discussed:    Additional Comments:  Erenest Rasher, RN 11/06/2018, 2:08 PM

## 2018-11-06 NOTE — Progress Notes (Addendum)
PHARMACY CONSULT NOTE FOR:  OUTPATIENT  PARENTERAL ANTIBIOTIC THERAPY (OPAT)  Indication: Pseudomonas Prosthetic Joint Infection Regimen: Cefepime 2gm IV q8h End date: 12/18/18  IV antibiotic discharge orders are pended. To discharging provider:  please sign these orders via discharge navigator,  Select New Orders & click on the button choice - Manage This Unsigned Work.     Thank you for allowing pharmacy to be a part of this patient's care.  Biagio Borg 11/06/2018, 2:42 PM

## 2018-11-06 NOTE — Progress Notes (Signed)
Physical Therapy Treatment Patient Details Name: Beth [name] MRN: [id number] DOB: 1946-10-10 Today's Date: 11/06/2018    History of Present Illness Pt is a 72 YO female admitted for R TK resection arthroplasty with placement of antibiotic spacer. Pt's R TKR complications include R TKR in 2015, I&D and polyexchange 2015, I&D and polyexchange 2016, reimplantation 2016, and abscess 2018. Pt with PMH of anemia, OA, cataracts, CKD III, CVA 2006, DM II, heart murmur, HLD, HTN. Surgical history includes carpal tunnel release and previously listed R knee surgeries.    PT Comments    Patient progressing some with ambulation, but slow pace and increased pain R LE with audible crepitus L LE.  She will need capable 24 hour support at d/c and follow up HHPT.  Recommending bath aide for home as well.  PT to follow.   Follow Up Recommendations  Home health PT;Supervision/Assistance - 24 hour(HH aide)     Equipment Recommendations  3in1 (PT)    Recommendations for Other Services       Precautions / Restrictions Precautions Precautions: Fall Required Braces or Orthoses: Knee Immobilizer - Right Knee Immobilizer - Right: On at all times Restrictions Other Position/Activity Restrictions: WBAT    Mobility  Bed Mobility Overal bed mobility: Needs Assistance Bed Mobility: Supine to Sit     Supine to sit: Min assist;HOB elevated     General bed mobility comments: assist for R LE and increased time to complete scooting to EOB    Transfers Overall transfer level: Needs assistance Equipment used: Rolling walker (2 wheeled) Transfers: Sit to/from Stand Sit to Stand: From elevated surface;Min assist;Mod assist;+2 physical assistance         General transfer comment: from higher bed surface min A, from recliner heavy lifting assist to stand and increased time  Ambulation/Gait Ambulation/Gait assistance: Min assist;+2 safety/equipment(assist for chair follow) Gait Distance (Feet): 30 Feet(&  15') Assistive device: Rolling walker (2 wheeled) Gait Pattern/deviations: Step-to pattern;Decreased stride length;Antalgic;Shuffle;Trunk flexed     General Gait Details: difficulty progressing legs and heavy UE support on walker, some audible crepitus L knee with ambulation   Stairs             Wheelchair Mobility    Modified Rankin (Stroke Patients Only)       Balance Overall balance assessment: Needs assistance   Sitting balance-[name]: Fair     Standing balance support: Bilateral upper extremity supported Standing balance-[name]: Poor                              Cognition Arousal/Alertness: Awake/alert Behavior During Therapy: WFL for tasks assessed/performed Overall Cognitive Status: Within Functional Limits for tasks assessed                                        Exercises Other Exercises Other Exercises: repositioned R leg for brace donning with improved extension     General Comments        Pertinent Vitals/Pain Faces Pain Scale: Hurts whole lot Pain Location: R knee with weight bearing and movement Pain Descriptors / Indicators: Operative site guarding;Grimacing;Moaning;Sore Pain Intervention(s): Monitored during session;Repositioned;Ice applied    Home Living                      Prior Function  PT Goals (current goals can now be found in the care plan section) Progress towards PT goals: Progressing toward goals    Frequency    Min 5X/week      PT Plan Current plan remains appropriate    Co-evaluation              AM-PAC PT "6 Clicks" Mobility   Outcome Measure  Help needed turning from your back to your side while in a flat bed without using bedrails?: A Little Help needed moving from lying on your back to sitting on the side of a flat bed without using bedrails?: A Little Help needed moving to and from a bed to a chair (including a wheelchair)?: A Little Help  needed standing up from a chair using your arms (e.g., wheelchair or bedside chair)?: A Lot Help needed to walk in hospital room?: A Lot Help needed climbing 3-5 steps with a railing? : Total 6 Click Score: 14    End of Session Equipment Utilized During Treatment: Gait belt;Right knee immobilizer Activity Tolerance: Patient limited by fatigue Patient left: with call bell/phone within reach;in chair;with chair alarm set   PT Visit Diagnosis: Difficulty in walking, not elsewhere classified (R26.2);Pain Pain - Right/Left: Right Pain - part of body: Knee     Time: 8329-1916 PT Time Calculation (min) (ACUTE ONLY): 40 min  Charges:  $Gait Training: 8-22 mins $Therapeutic Exercise: 8-22 mins $Therapeutic Activity: 8-22 mins                     Magda Kiel, [place] [facility] [phone] 11/06/2018    Reginia Naas 11/06/2018, 1:55 PM

## 2018-11-06 NOTE — Progress Notes (Signed)
   Subjective: 3 Days Post-Op Procedure(s) (LRB): Right knee resection arthroplasty; antibiotic spacer (Right)  Pt c/o mild to moderate pain at times Plan for PICC line this morning and possible d/c if cleared by PT and appropriate antibiotic determined She denies any new symptoms or issues Patient reports pain as mild.  Objective:   VITALS:   Vitals:   11/05/18 2118 11/06/18 0430  BP: (!) 167/89 (!) 154/77  Pulse: 71 69  Resp: 16 18  Temp: 99.6 F (37.6 C) 99.9 F (37.7 C)  SpO2: 96% 95%    Right knee incision healing well nv intact distally No rashes Minimal edema distally   LABS Recent Labs    11/04/18 0529 11/05/18 0520 11/06/18 0538  HGB 10.2* 9.4* 9.3*  HCT 34.6* 31.6* 31.0*  WBC 8.7 7.5 7.4  PLT 304 275 289    Recent Labs    11/04/18 0529 11/05/18 0520  NA 136 138  K 4.3 4.2  BUN 20 22  CREATININE 0.83 0.86  GLUCOSE 175* 123*     Assessment/Plan: 3 Days Post-Op Procedure(s) (LRB): Right knee resection arthroplasty; antibiotic spacer (Right) Plan for PICC line this morning If cleared by PT and appropriate home antibiotic determined, possible d/c today vs tomorrow Pt in agreement Pain management as needed Will continue to monitor her progress    Merla Riches PA-C, [place] is now [employer] [facility] [address], [place], [place] [address] Phone: [phone] [email or web address]  [employer]

## 2018-11-06 NOTE — Plan of Care (Signed)
Pt remains stable though very weak with difficult mobility with physical therapy. Picc line placed today by iv team. Dr. Tommy Medal messaged about fluid culture results, awaiting call back. No needs at this time. No changes needed to care plans.

## 2018-11-06 NOTE — Progress Notes (Signed)
Peripherally Inserted Central Catheter/Midline Placement  The IV Nurse has discussed with the patient and/or persons authorized to consent for the patient, the purpose of this procedure and the potential benefits and risks involved with this procedure.  The benefits include less needle sticks, lab draws from the catheter, and the patient may be discharged home with the catheter. Risks include, but not limited to, infection, bleeding, blood clot (thrombus formation), and puncture of an artery; nerve damage and irregular heartbeat and possibility to perform a PICC exchange if needed/ordered by physician.  Alternatives to this procedure were also discussed.  Bard Power PICC patient education guide, fact sheet on infection prevention and patient information card has been provided to patient /or left at bedside.    PICC/Midline Placement Documentation        Beth [name] 11/06/2018, 8:35 AM

## 2018-11-06 NOTE — Plan of Care (Signed)
  Problem: Health Behavior/Discharge Planning: Goal: Ability to manage health-related needs will improve Outcome: Progressing   Problem: Clinical Measurements: Goal: Ability to maintain clinical measurements within normal limits will improve Outcome: Progressing Goal: Will remain free from infection Outcome: Progressing Goal: Diagnostic test results will improve Outcome: Progressing Goal: Respiratory complications will improve Outcome: Progressing Goal: Cardiovascular complication will be avoided Outcome: Progressing   Problem: Activity: Goal: Risk for activity intolerance will decrease Outcome: Progressing   Problem: Skin Integrity: Goal: Risk for impaired skin integrity will decrease Outcome: Progressing

## 2018-11-06 NOTE — Care Management Important Message (Signed)
Important Message  Patient Details  Name: Beth [name] MRN: [id number] Date of Birth: May 02, 1946   Medicare Important Message Given:  Yes    Erenest Rasher, RN 11/06/2018, 2:19 PM

## 2018-11-07 DIAGNOSIS — M00861 Arthritis due to other bacteria, right knee: Secondary | ICD-10-CM

## 2018-11-07 DIAGNOSIS — B965 Pseudomonas (aeruginosa) (mallei) (pseudomallei) as the cause of diseases classified elsewhere: Secondary | ICD-10-CM

## 2018-11-07 LAB — HCV COMMENT:

## 2018-11-07 LAB — HEPATITIS C ANTIBODY (REFLEX): HCV Ab: 0.2 s/co ratio (ref 0.0–0.9)

## 2018-11-07 MED ORDER — SODIUM CHLORIDE 0.9 % IV SOLN: 2.0000 g | Freq: Three times a day (TID) | INTRAVENOUS | 0 refills | Status: DC

## 2018-11-07 MED ORDER — CEFEPIME IV (FOR PTA / DISCHARGE USE ONLY): 2.0000 g | Freq: Three times a day (TID) | INTRAVENOUS | 0 refills | Status: DC

## 2018-11-07 NOTE — Plan of Care (Signed)
Pt stable with no needs. Pt remains weak and tired overall. No changes needed to care plans. Rn medicated pt for pain. Rn updated ortho pa about plan of care with infectious disease md. No s/s of distress or pain. Will continue to monitor.

## 2018-11-07 NOTE — Progress Notes (Signed)
Diagnosis: PJI  Culture Result: Pseudomonas aeruginosa  Allergies  Allergen Reactions  . Aleve [Naproxen Sodium] Other (See Comments)    Heart races  . Asa [Aspirin] Other (See Comments)    Nose bleeding  . Clonidine Derivatives Other (See Comments)    Dizziness and weakness  . Shellfish Allergy Nausea And Vomiting    OPAT Orders Discharge antibiotics: Cefepime  Duration: 6 weeks End Date: January the 10th 2020   [place] Per Protocol:  Labs weekly while on IV antibiotics: _x_ CBC with differential x__ BMP  _x_ CRP _x_ ESR   x__ Please pull PIC at completion of IV antibiotics __ Please leave PIC in place until doctor has seen patient or been notified  Fax weekly labs to [phone]  Clinic Follow Up Appt:  Early January.

## 2018-11-07 NOTE — Progress Notes (Signed)
Subjective: 4 Days Post-Op Procedure(s) (LRB): Right knee resection arthroplasty; antibiotic spacer (Right) Patient reports pain as mild.   Patient seen in rounds for Dr. Wynelle Link. Patient is well, but has had some minor complaints of pain in the right knee, requiring pain medications. She reports that she is going home once she has family available to help. She reports that she was going to go to her sister's home but now her sister is sick with the flu. She is hopeful for DC home with family tomorrow.  Denies SOB and chest pain.   Objective: Vital signs in last 24 hours: Temp:  [98.5 F (36.9 C)-98.6 F (37 C)] 98.6 F (37 C) (12/01 0554) Pulse Rate:  [65-71] 71 (12/01 0554) Resp:  [16] 16 (12/01 0554) BP: (156-182)/(86-93) 182/93 (12/01 0554) SpO2:  [98 %] 98 % (12/01 0554)  Intake/Output from previous day:  Intake/Output Summary (Last 24 hours) at 11/07/2018 0840 Last data filed at 11/07/2018 0647 Gross per 24 hour  Intake 2000 ml  Output 291 ml  Net 1709 ml     Labs: Recent Labs    11/05/18 0520 11/06/18 0538  HGB 9.4* 9.3*   Recent Labs    11/05/18 0520 11/06/18 0538  WBC 7.5 7.4  RBC 3.74* 3.77*  HCT 31.6* 31.0*  PLT 275 289   Recent Labs    11/05/18 0520  NA 138  K 4.2  CL 106  CO2 25  BUN 22  CREATININE 0.86  GLUCOSE 123*  CALCIUM 8.3*    EXAM General - Patient is Alert and Oriented Extremity - Neurologically intact Intact pulses distally Dorsiflexion/Plantar flexion intact No cellulitis present Compartment soft Dressing/Incision - mild serosanguinous drainage from distal end of incision Motor Function - intact, moving foot and toes well on exam.   Past Medical History:  Diagnosis Date  . Anemia   . Arthritis    Knee both knees  . Blood transfusion without reported diagnosis 2012   anemia;pt denies transfusion stated was only on iron tablet  . Cataract    left  . CKD (chronic kidney disease), stage III ([place])   . CVA (cerebral  infarction)    2006  . Diabetes mellitus without complication ([place])   . Family history of anesthesia complication    sister very slow to awaken after anesthesia;severe vomiting   . Gout    left elbow  . Heart murmur   . 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 08-09-14 Right eye  . Hyperlipidemia   . Hypertension   . Nocturia    3-4 times per night  . Stroke ([place]) 2006   x 1 no deficits noted     Assessment/Plan: 4 Days Post-Op Procedure(s) (LRB): Right knee resection arthroplasty; antibiotic spacer (Right) Active Problems:   Septic arthritis of knee, right (HCC)  Estimated body mass index is 35.95 kg/m as calculated from the following:   Height as of this encounter: 5\' 5"  (1.651 m).   Weight as of this encounter: 98 kg. Advance diet Up with therapy Continue ABX therapy due to Culture taken of surgical site during joint revision  DVT Prophylaxis - Xarelto Weight-Bearing as tolerated  Continue PT. Awaiting final culture results and recommendation from ID for IV abx. PICC line in place. Patient reports that she is going home with family tomorrow and RN reports that arrangements are being made for SNF. No other documentation noted in chart about SNF. Will be hopeful for DC home tomorrow with family and with  HHRN in place for IV abx administration.   Ardeen Jourdain, PA-C Orthopaedic Surgery 11/07/2018, 8:40 AM

## 2018-11-07 NOTE — Clinical Social Work Note (Signed)
Clinical Social Work Assessment  Patient Details  Name: Beth [name] MRN: [id number] Date of Birth: 06/01/1946  Date of referral:  11/07/18               Reason for consult:  Facility Placement                Permission sought to share information with:  Facility Art therapist granted to share information::  Yes, Verbal Permission Granted  Name::     Beth [name]  Agency::  SNF  Relationship::  Son  Contact Information:  [phone]  Housing/Transportation Living arrangements for the past 2 months:  Single Family Home Source of Information:  Patient Patient Interpreter Needed:  None Criminal Activity/Legal Involvement Pertinent to Current Situation/Hospitalization:  No - Comment as needed Significant Relationships:  Adult Children, Other Family Members Lives with:  Self, Relatives(Granddaughter) Do you feel safe going back to the place where you live?  Yes Need for family participation in patient care:  No (Coment)  Care giving concerns:  Patient currently lives with granddaughter who works third shift and is unable to provide support to patient during the day due to needing to sleep for work. Patient has nearby supportive family but is typically alone much of the day. PT recommending short term therapy to regain strength before returning home.    Social Worker assessment / plan:  CSW met with patient at bedside to discuss discharge plan and SNF referral process. Patient states she lives at home with her granddaughter who works third shift. She has multiple supportive children who live nearby.  She was previously admitted 9/19 and received therapy at [facility]. Patient reports she had good experience there and prefers to return. If unable to go to [facility], patient's second choice is [facility]. She prefers a facility close to her home in [place].   CSW explained [facility] insurance auth process and answered questions regarding auth. Completed FL2  and faxed out to facilities. Will f/u with bed offers.   Employment status:  Retired Nurse, adult PT Recommendations:  [place] / Referral to community resources:  [place]  Patient/Family's Response to care:  Patient is willing to go to SNF and recognizes need for short term therapy due to lack of available family support during the day. Appreciative of CSW involvement and questions answered.  Patient/Family's Understanding of and Emotional Response to Diagnosis, Current Treatment, and Prognosis:  Patient understands SNF process and PT recommendation. She has previously been to [facility] and knows what to expect.  Emotional Assessment Appearance:  Appears older than stated age Attitude/Demeanor/Rapport:  Engaged Affect (typically observed):  Appropriate, Pleasant Orientation:  Oriented to Self, Oriented to Place, Oriented to  Time, Oriented to Situation Alcohol / Substance use:  Not Applicable Psych involvement (Current and /or in the community):  No (Comment)  Discharge Needs  Concerns to be addressed:  Care Coordination Readmission within the last 30 days:  No Current discharge risk:  Physical Impairment Barriers to Discharge:  Ship broker, Continued Medical Work up   [employer], [place] 11/07/2018, 3:21 PM

## 2018-11-07 NOTE — NC FL2 (Signed)
[place] LEVEL OF CARE SCREENING TOOL     IDENTIFICATION  Patient Name: Beth [name] Birthdate: [birth year] Sex: female Admission Date (Current Location): 11/03/2018  [facility] and [place] Number:  Herbalist and Address:  [facility],  [place] [address] [place]      Provider Number: [id number]  Attending Physician Name and Address:  [name], MD  Relative Name and Phone Number:  Niambi Smoak: [phone]    Current Level of Care: Hospital Recommended Level of Care: [place] Prior Approval Number:    Date Approved/Denied:   PASRR Number: [id number]  Discharge Plan: SNF    Current Diagnoses: Patient Active Problem List   Diagnosis Date Noted  . Septic arthritis of knee, right ([place]) 11/03/2018  . Steroid-induced hyperglycemia   . 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 Cellulitis and abscess of leg 01/14/2017  . Abscess of right leg 01/14/2017  . Diabetes type 2, controlled ([place]) 03/07/2016  . Gout   . Essential hypertension   . Enuresis 11/19/2015  . HLD (hyperlipidemia) 01/04/2014  . Anemia, iron deficiency 12/30/2011  . Constipation 04/04/2009  . TUBULOVILLOUS ADENOMA, COLON 04/03/2009    Orientation RESPIRATION BLADDER Height & Weight     Self, Time, Situation, Place  Normal External catheter Weight: 216 lb 0.8 oz (98 kg) Height:  5\' 5"  (165.1 cm)  BEHAVIORAL SYMPTOMS/MOOD NEUROLOGICAL BOWEL NUTRITION STATUS      Continent Diet(Carb modified)  AMBULATORY STATUS COMMUNICATION OF NEEDS Skin   Limited Assist Verbally Surgical wounds(R knee incision)                       Personal Care Assistance Level of Assistance  Bathing, Feeding, Dressing Bathing Assistance: Limited assistance Feeding assistance: Limited assistance Dressing Assistance: Limited assistance     Functional Limitations Info  Speech, Hearing, Sight Sight Info: Adequate Hearing Info:  Adequate Speech Info: Adequate    SPECIAL CARE FACTORS FREQUENCY  PT (By licensed PT), OT (By licensed OT)     PT Frequency: 5x/week OT Frequency: 5x/week            Contractures Contractures Info: Not present    Additional Factors Info  Code Status, Allergies Code Status Info: Full Allergies Info: ALEVE NAPROXEN SODIUM, ASA ASPIRIN, CLONIDINE DERIVATIVES, SHELLFISH ALLERGY            Current Medications (11/07/2018):  This is the current hospital active medication list Current Facility-Administered Medications  Medication Dose Route Frequency Provider Last Rate Last Dose  . 0.9 %  sodium chloride infusion   Intravenous Continuous [name], MD   Stopped at 11/04/18 1145  . amLODipine (NORVASC) tablet 10 mg  10 mg Oral QPM [name], MD   10 mg at 11/06/18 1637  . atorvastatin (LIPITOR) tablet 20 mg  20 mg Oral QPM [name], MD   20 mg at 11/06/18 1637  . bisacodyl (DULCOLAX) suppository 10 mg  10 mg Rectal Daily PRN Aluisio, Pilar Plate, MD      . ceFEPIme (MAXIPIME) 2 g in sodium chloride 0.9 % 100 mL IVPB  2 g Intravenous Q8H Thomes Lolling, RPH 200 mL/hr at 11/07/18 1324 2 g at 11/07/18 1324  . diphenhydrAMINE (BENADRYL) 12.5 MG/5ML elixir 12.5-25 mg  12.5-25 mg Oral Q4H PRN Aluisio, Pilar Plate, MD      . docusate sodium (COLACE) capsule 100 mg  100 mg Oral BID [name], MD   100 mg  at 11/07/18 0924  . ferrous sulfate tablet 325 mg  325 mg Oral BID WC [name], MD   325 mg at 11/07/18 0254  . hydrALAZINE (APRESOLINE) tablet 100 mg  100 mg Oral TID [name], MD   100 mg at 11/07/18 0923  . irbesartan (AVAPRO) tablet 300 mg  300 mg Oral Daily [name], MD   300 mg at 11/07/18 0923  . menthol-cetylpyridinium (CEPACOL) lozenge 3 mg  1 lozenge Oral PRN Aluisio, Pilar Plate, MD       Or  . phenol (CHLORASEPTIC) mouth spray 1 spray  1 spray Mouth/Throat PRN Aluisio, Pilar Plate, MD      . methocarbamol (ROBAXIN) tablet 500 mg  500 mg Oral Q6H PRN [name], MD   500 mg at 11/07/18 2706   Or  . methocarbamol (ROBAXIN) 500 mg in dextrose 5 % 50 mL IVPB  500 mg Intravenous Q6H PRN [name], MD   Stopped at 11/05/18 2376  . metoCLOPramide (REGLAN) tablet 5-10 mg  5-10 mg Oral Q8H PRN [name], MD       Or  . metoCLOPramide (REGLAN) injection 5-10 mg  5-10 mg Intravenous Q8H PRN Aluisio, Pilar Plate, MD      . morphine 2 MG/ML injection 1-2 mg  1-2 mg Intravenous Q2H PRN [name], MD   2 mg at 11/03/18 2059  . nebivolol (BYSTOLIC) tablet 20 mg  20 mg Oral Daily [name], MD   20 mg at 11/07/18 0923  . ondansetron (ZOFRAN) tablet 4 mg  4 mg Oral Q6H PRN Aluisio, Pilar Plate, MD       Or  . ondansetron [facility]) injection 4 mg  4 mg Intravenous Q6H PRN [name], MD   4 mg at 11/03/18 1914  . oxyCODONE (Oxy IR/ROXICODONE) immediate release tablet 10 mg  10 mg Oral Q4H PRN [name], MD   10 mg at 11/05/18 1301  . oxyCODONE (Oxy IR/ROXICODONE) immediate release tablet 5 mg  5 mg Oral Q4H PRN Aluisio, Pilar Plate, MD      . polyethylene glycol (MIRALAX / GLYCOLAX) packet 17 g  17 g Oral Daily PRN [name], MD   17 g at 11/06/18 1636  . rivaroxaban (XARELTO) tablet 10 mg  10 mg Oral Q breakfast [name], MD   10 mg at 11/07/18 0923  . sodium chloride flush (NS) 0.9 % injection 10-40 mL  10-40 mL Intracatheter PRN Aluisio, Pilar Plate, MD      . sodium phosphate (FLEET) 7-19 GM/118ML enema 1 enema  1 enema Rectal Once PRN Aluisio, Pilar Plate, MD      . traMADol Veatrice [place]) tablet 50-100 mg  50-100 mg Oral Q6H PRN [name], MD   50 mg at 11/07/18 1135   Facility-Administered Medications Ordered in Other Encounters  Medication Dose Route Frequency Provider Last Rate Last Dose  . tranexamic acid (CYKLOKAPRON) 2,000 mg in sodium chloride 0.9 % 50 mL Topical Application  2,831 mg Topical Once [name], MD         Discharge Medications: Please see discharge summary for a list of discharge medications.  Relevant Imaging  Results:  Relevant Lab Results:   Additional Information SSN: [id number]  Pricilla Holm, [place]

## 2018-11-07 NOTE — Progress Notes (Signed)
Subjective: No new complaints   Antibiotics:  Anti-infectives (From admission, onward)   Start     Dose/Rate Route Frequency Ordered Stop   11/07/18 0000  ceFEPime (MAXIPIME) IVPB     2 g Intravenous Every 8 hours 11/07/18 0847 12/19/18 2359   11/07/18 0000  ceFEPIme 2 g in sodium chloride 0.9 % 100 mL     2 g 200 mL/hr over 30 Minutes Intravenous Every 8 hours 11/07/18 0847     11/06/18 1400  ceFEPIme (MAXIPIME) 2 g in sodium chloride 0.9 % 100 mL IVPB     2 g 200 mL/hr over 30 Minutes Intravenous Every 8 hours 11/06/18 1102     11/05/18 1900  cefTRIAXone (ROCEPHIN) 2 g in sodium chloride 0.9 % 100 mL IVPB  Status:  Discontinued     2 g 200 mL/hr over 30 Minutes Intravenous Every 24 hours 11/05/18 1819 11/06/18 1059   11/04/18 2200  vancomycin (VANCOCIN) IVPB 1000 mg/200 mL premix  Status:  Discontinued     1,000 mg 200 mL/hr over 60 Minutes Intravenous Every 24 hours 11/03/18 1922 11/06/18 1102   11/03/18 2000  vancomycin (VANCOCIN) 2,000 mg in sodium chloride 0.9 % 500 mL IVPB     2,000 mg 250 mL/hr over 120 Minutes Intravenous  Once 11/03/18 1916 11/04/18 0131   11/03/18 1130  ceFAZolin (ANCEF) IVPB 2g/100 mL premix     2 g 200 mL/hr over 30 Minutes Intravenous On call to O.R. 11/03/18 1123 11/03/18 1331      Medications: Scheduled Meds: . amLODipine  10 mg Oral QPM  . atorvastatin  20 mg Oral QPM  . docusate sodium  100 mg Oral BID  . ferrous sulfate  325 mg Oral BID WC  . hydrALAZINE  100 mg Oral TID  . irbesartan  300 mg Oral Daily  . nebivolol  20 mg Oral Daily  . rivaroxaban  10 mg Oral Q breakfast   Continuous Infusions: . sodium chloride Stopped (11/04/18 1145)  . ceFEPime (MAXIPIME) IV 2 g (11/07/18 1324)  . methocarbamol (ROBAXIN) IV Stopped (11/05/18 0803)   PRN Meds:.bisacodyl, diphenhydrAMINE, menthol-cetylpyridinium **OR** phenol, methocarbamol **OR** methocarbamol (ROBAXIN) IV, metoCLOPramide **OR** metoCLOPramide (REGLAN) injection,  morphine injection, ondansetron **OR** ondansetron (ZOFRAN) IV, oxyCODONE, oxyCODONE, polyethylene glycol, sodium chloride flush, sodium phosphate, traMADol    Objective: Weight change:   Intake/Output Summary (Last 24 hours) at 11/07/2018 1347 Last data filed at 11/07/2018 1338 Gross per 24 hour  Intake 2260 ml  Output 310 ml  Net 1950 ml   Blood pressure (!) 158/85, pulse 66, temperature 98.3 F (36.8 C), temperature source Oral, resp. rate 16, height 5\' 5"  (1.651 m), weight 98 kg, SpO2 98 %. Temp:  [98.3 F (36.8 C)-98.6 F (37 C)] 98.3 F (36.8 C) (12/01 1324) Pulse Rate:  [65-71] 66 (12/01 1324) Resp:  [16] 16 (12/01 1324) BP: (156-182)/(85-93) 158/85 (12/01 1324) SpO2:  [98 %] 98 % (12/01 1324)  Physical Exam: General: Alert and awake, oriented x3, not in any acute distress. HEENT: anicteric sclera, EOMI CVS regular rate, normal  Chest: , no wheezing, no respiratory distress Abdomen: soft non-distended,  Extremities: bandage in place Skin: no rashes Neuro: nonfocal  CBC:    BMET Recent Labs    11/05/18 0520  NA 138  K 4.2  CL 106  CO2 25  GLUCOSE 123*  BUN 22  CREATININE 0.86  CALCIUM 8.3*     Liver Panel  No results for  input(s): PROT, ALBUMIN, AST, ALT, ALKPHOS, BILITOT, BILIDIR, IBILI in the last 72 hours.     Sedimentation Rate Recent Labs    11/06/18 0538  ESRSEDRATE 73*   C-Reactive Protein Recent Labs    11/06/18 0538  CRP 8.1*    Micro Results: Recent Results (from the past 720 hour(s))  Surgical pcr screen     Status: None   Collection Time: 10/27/18  2:50 PM  Result Value Ref Range Status   MRSA, PCR NEGATIVE NEGATIVE Final   Staphylococcus aureus NEGATIVE NEGATIVE Final    Comment: (NOTE) The Xpert SA Assay (FDA approved for NASAL specimens in patients 23 years of age and older), is one component of a comprehensive surveillance program. It is not intended to diagnose infection nor to guide or monitor  treatment. Performed at [facility], [place] [address] [place], [place] [address]   Body fluid culture     Status: None   Collection Time: 11/03/18  1:25 PM  Result Value Ref Range Status   Specimen Description FLUID SYNOVIAL RIGHT KNEE  Final   Special Requests   Final    NONE Performed at [facility], [place] [address] [place], [place] [address]    Gram Stain   Final    MODERATE WBC PRESENT, PREDOMINANTLY PMN NO ORGANISMS SEEN    Culture   Final    RARE PSEUDOMONAS AERUGINOSA CRITICAL RESULT CALLED TO, READ BACK BY AND VERIFIED WITH: RN Anderson Malta MILLER [phone] FCP  Performed at [facility], [place] [address] [place], [place] [address]    Report Status 11/06/2018 FINAL  Final   Organism ID, Bacteria PSEUDOMONAS AERUGINOSA  Final      Susceptibility   Pseudomonas aeruginosa - MIC*    CEFTAZIDIME 4 SENSITIVE Sensitive     CIPROFLOXACIN <=0.25 SENSITIVE Sensitive     GENTAMICIN 2 SENSITIVE Sensitive     IMIPENEM 2 SENSITIVE Sensitive     PIP/TAZO 8 SENSITIVE Sensitive     CEFEPIME 2 SENSITIVE Sensitive     * RARE PSEUDOMONAS AERUGINOSA  Anaerobic culture     Status: None (Preliminary result)   Collection Time: 11/03/18  1:25 PM  Result Value Ref Range Status   Specimen Description FLUID SYNOVIAL RIGHT KNEE  Final   Special Requests SWAB  Final   Culture   Final    NO GROWTH < 24 HOURS NO ANAEROBES ISOLATED; CULTURE IN PROGRESS FOR 5 DAYS Performed at [facility], [place] [address], [place],  [address]    Report Status PENDING  Incomplete    Studies/Results: No results found.    Assessment/Plan:  INTERVAL HISTORY: Pansensitive Pseudomonas from her knee   Active Problems:   Septic arthritis of knee, right (HCC)    Beth [name] is a 72 y.o. 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  I will place O. Pat order.  Not clear to me whether she is going home or to skilled nursing facility but will defer to primary team in terms of appropriate placement.  I will sign off for now please call with further questions.  LOS: 4 days  Rhina Brackett Dam 11/07/2018, 1:47 PM

## 2018-11-07 NOTE — Progress Notes (Signed)
Physical Therapy Treatment Patient Details Name: Beth [name] MRN: [id number] DOB: November 22, 1946 Today's Date: 11/07/2018    History of Present Illness Pt is a 72 YO female admitted for R TK resection arthroplasty with placement of antibiotic spacer. Pt's R TKR complications include R TKR in 2015, I&D and polyexchange 2015, I&D and polyexchange 2016, reimplantation 2016, and abscess 2018. Pt with PMH of anemia, OA, cataracts, CKD III, CVA 2006, DM II, heart murmur, HLD, HTN. Surgical history includes carpal tunnel release and previously listed R knee surgeries.    PT Comments    Patient slowly progressing and pushing herself to walk a little further this session, but remains limited due to pain in both knees and instability in L knee requiring +2 A for sit to stand from regular height and even elevated surfaces as well as +2 A for ambulation for chair close due to history of L knee buckling.  Feel she will need SNF level rehab as family now unable to assist. PT to follow acutely.   Follow Up Recommendations  SNF;Supervision/Assistance - 24 hour     Equipment Recommendations       Recommendations for Other Services       Precautions / Restrictions Precautions Precautions: Fall Required Braces or Orthoses: Knee Immobilizer - Right Knee Immobilizer - Right: On at all times Restrictions Other Position/Activity Restrictions: WBAT    Mobility  Bed Mobility Overal bed mobility: Needs Assistance Bed Mobility: Supine to Sit     Supine to sit: Min assist;HOB elevated;Mod assist     General bed mobility comments: assist for R LE and assist to complete scooting to EOB    Transfers Overall transfer level: Needs assistance Equipment used: Rolling walker (2 wheeled) Transfers: Sit to/from Stand Sit to Stand: From elevated surface;+2 safety/equipment;Mod assist         General transfer comment: lifting assist from EOB   Ambulation/Gait Ambulation/Gait assistance: Min assist;+2  safety/equipment(for chair follow due to L LE buckling at times) Gait Distance (Feet): 35 Feet Assistive device: Rolling walker (2 wheeled) Gait Pattern/deviations: Step-to pattern;Decreased stride length;Wide base of support;Trunk flexed     General Gait Details: increased time, painful on L LE but limited weight bearing on R due to pain    Stairs             Wheelchair Mobility    Modified Rankin (Stroke Patients Only)       Balance Overall balance assessment: Needs assistance   Sitting balance-[name]: Fair     Standing balance support: Bilateral upper extremity supported Standing balance-[name]: Poor Standing balance comment: UE support for balance due to pain, limited weight tolerance R                             Cognition Arousal/Alertness: Awake/alert Behavior During Therapy: WFL for tasks assessed/performed Overall Cognitive Status: Within Functional Limits for tasks assessed                                        Exercises      General Comments General comments (skin integrity, edema, etc.): patient reports her sister who was going to help with getting her IV antibotics at home now has the flu and reports wants to pursue SNF      Pertinent Vitals/Pain Faces Pain Scale: Hurts whole lot Pain Location: R knee  with weight bearing and movement Pain Descriptors / Indicators: Operative site guarding;Grimacing;Moaning;Sore Pain Intervention(s): Monitored during session;Repositioned;Premedicated before session;Ice applied    Home Living                      Prior Function            PT Goals (current goals can now be found in the care plan section) Progress towards PT goals: Progressing toward goals    Frequency    Min 3X/week      PT Plan Discharge plan needs to be updated;Frequency needs to be updated    Co-evaluation              AM-PAC PT "6 Clicks" Mobility   Outcome Measure  Help  needed turning from your back to your side while in a flat bed without using bedrails?: A Lot Help needed moving from lying on your back to sitting on the side of a flat bed without using bedrails?: A Lot Help needed moving to and from a bed to a chair (including a wheelchair)?: A Lot Help needed standing up from a chair using your arms (e.g., wheelchair or bedside chair)?: A Lot Help needed to walk in hospital room?: A Lot Help needed climbing 3-5 steps with a railing? : A Lot 6 Click Score: 12    End of Session Equipment Utilized During Treatment: Gait belt;Right knee immobilizer Activity Tolerance: Patient limited by pain;Patient limited by fatigue Patient left: in chair;with call bell/phone within reach   PT Visit Diagnosis: Difficulty in walking, not elsewhere classified (R26.2);Pain Pain - Right/Left: Right Pain - part of body: Knee     Time: 2831-5176 PT Time Calculation (min) (ACUTE ONLY): 28 min  Charges:  $Gait Training: 8-22 mins $Therapeutic Activity: 8-22 mins                     Magda Kiel, [place] [facility] [phone] 11/07/2018    Reginia Naas 11/07/2018, 2:32 PM

## 2018-11-08 ENCOUNTER — Encounter (HOSPITAL_COMMUNITY): Payer: Self-pay | Admitting: Orthopedic Surgery

## 2018-11-08 LAB — ANAEROBIC CULTURE: Culture: NO GROWTH

## 2018-11-08 NOTE — Progress Notes (Signed)
Physical Therapy Treatment Patient Details Name: Beth [name] MRN: [id number] DOB: 1946-04-23 Today's Date: 11/08/2018    History of Present Illness Pt is a 72 YO female admitted for R TK resection arthroplasty with placement of antibiotic spacer. Pt's R TKR complications include R TKR in 2015, I&D and polyexchange 2015, I&D and polyexchange 2016, reimplantation 2016, and abscess 2018. Pt with PMH of anemia, OA, cataracts, CKD III, CVA 2006, DM II, heart murmur, HLD, HTN. Surgical history includes carpal tunnel release and previously listed R knee surgeries.    PT Comments    Progressing slowly, limited by pain today despite meds; agreeable to OOB, pleasant and cooperative and willing to work within limits of pain; continue to recommend SNF post acute    Follow Up Recommendations  SNF;Supervision/Assistance - 24 hour     Equipment Recommendations       Recommendations for Other Services       Precautions / Restrictions Precautions Precautions: Fall Precaution Comments: L knee buckles with gait Required Braces or Orthoses: Knee Immobilizer - Right Knee Immobilizer - Right: On at all times Restrictions Weight Bearing Restrictions: No Other Position/Activity Restrictions: WBAT    Mobility  Bed Mobility Overal bed mobility: Needs Assistance Bed Mobility: Supine to Sit     Supine to sit: HOB elevated;Mod assist;Max assist Sit to supine: Mod assist   General bed mobility comments: assist for R LE and bed pad used to complete scooting to EOB, incr time  Transfers Overall transfer level: Needs assistance Equipment used: Rolling walker (2 wheeled) Transfers: Sit to/from [employer] Transfers Sit to Stand: From elevated surface;+2 safety/equipment;Mod assist Stand pivot transfers: Mod assist;+2 physical assistance;+2 safety/equipment       General transfer comment: lifting assist from EOB, (has lift chair at home); multi-modal cues for sequencing, use of UEs for SPT  bed to chair, assist for balance, posterior LOB after pivot  Ambulation/Gait         Gait velocity: decreased    General Gait Details: unable d/t pain   Stairs             Wheelchair Mobility    Modified Rankin (Stroke Patients Only)       Balance Overall balance assessment: Needs assistance   Sitting balance-[name]: Fair   Postural control: Posterior lean Standing balance support: Bilateral upper extremity supported Standing balance-[name]: Poor Standing balance comment: UE support adn external assist  for balance due to pain, limited weight tolerance R                             Cognition Arousal/Alertness: Awake/alert Behavior During Therapy: WFL for tasks assessed/performed Overall Cognitive Status: Within Functional Limits for tasks assessed                                        Exercises Total Joint Exercises Ankle Circles/Pumps: AROM;10 reps;Both;Supine    General Comments        Pertinent Vitals/Pain Pain Assessment: 0-10 Pain Score: 7  Pain Location: R knee with  movement Pain Descriptors / Indicators: Discomfort;Sore Pain Intervention(s): Limited activity within patient's tolerance;Monitored during session;Premedicated before session;Repositioned;Ice applied    Home Living Family/patient expects to be discharged to:: Skilled nursing facility Living Arrangements: Other relatives(grandaughter) Available Help at Discharge: Family;Available 24 hours/day Type of Home: House Home Access: Ramped entrance   Home  Layout: One level Home Equipment: Bedside commode;Walker - 2 wheels;Cane - single point;Shower seat;Wheelchair - manual      Prior Function Level of Independence: Needs assistance  Gait / Transfers Assistance Needed: walker in the home for 10-15 ft distances, uses w/c to get groceries with granddaughter        PT Goals (current goals can now be found in the care plan section) Acute Rehab PT  Goals Patient Stated Goal: to go home PT Goal Formulation: With patient Time For Goal Achievement: 11/11/18 Potential to Achieve Goals: Good Progress towards PT goals: Progressing toward goals    Frequency    Min 3X/week      PT Plan Discharge plan needs to be updated;Frequency needs to be updated    Co-evaluation              AM-PAC PT "6 Clicks" Mobility   Outcome Measure  Help needed turning from your back to your side while in a flat bed without using bedrails?: A Lot Help needed moving from lying on your back to sitting on the side of a flat bed without using bedrails?: A Lot Help needed moving to and from a bed to a chair (including a wheelchair)?: A Lot Help needed standing up from a chair using your arms (e.g., wheelchair or bedside chair)?: A Lot Help needed to walk in hospital room?: A Lot Help needed climbing 3-5 steps with a railing? : Total 6 Click Score: 11    End of Session Equipment Utilized During Treatment: Gait belt;Right knee immobilizer Activity Tolerance: Patient limited by pain;Patient limited by fatigue Patient left: in chair;with call bell/phone within reach;with chair alarm set Nurse Communication: Mobility status PT Visit Diagnosis: Difficulty in walking, not elsewhere classified (R26.2);Pain Pain - Right/Left: Right Pain - part of body: Knee     Time: 1530-1551 PT Time Calculation (min) (ACUTE ONLY): 21 min  Charges:  $Therapeutic Activity: 8-22 mins                     Kenyon Ana, PT  Pager: [phone] Acute Rehab Dept [facility]): [phone]   11/08/2018    [facility] 11/08/2018, 3:57 PM

## 2018-11-08 NOTE — Discharge Summary (Addendum)
Physician Discharge Summary   Patient ID: Beth [name] MRN: [id number] DOB/AGE: 1946/10/31 72 y.o.  Admit date: 11/03/2018 Discharge date: 11/09/2018  Primary Diagnosis: Infected right total knee arthroplasty  Admission Diagnoses:  Past Medical History:  Diagnosis Date  . Anemia   . Arthritis    Knee both knees  . Blood transfusion without reported diagnosis 2012   anemia;pt denies transfusion stated was only on iron tablet  . Cataract    left  . CKD (chronic kidney disease), stage III ([place])   . CVA (cerebral infarction)    2006  . Diabetes mellitus without complication ([place])   . Family history of anesthesia complication    sister very slow to awaken after anesthesia;severe vomiting   . Gout    left elbow  . Heart murmur   . 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 08-09-14 Right eye  . Hyperlipidemia   . Hypertension   . Nocturia    3-4 times per night  . Stroke [facility]) 2006   x 1 no deficits noted    Discharge Diagnoses:   Active Problems:   Septic arthritis of knee, right (HCC)  Estimated body mass index is 35.95 kg/m as calculated from the following:   Height as of this encounter: 5' 5"  (1.651 m).   Weight as of this encounter: 98 kg.  Procedure:  Procedure(s) (LRB): Right knee resection arthroplasty; antibiotic spacer (Right)   Consults: ID  HPI: The patient is a 72 year old female with complex history in regard to her right knee.  She has an infected total knee arthroplasty revision, and now she presents for resection arthroplasty with placement of antibiotic spacer.  Laboratory Data: Admission on 11/03/2018  Component Date Value Ref Range Status  . Hgb A1c MFr Bld 11/03/2018 5.9* 4.8 - 5.6 % Final   Comment: (NOTE) Pre diabetes:          5.7%-6.4% Diabetes:              >6.4% Glycemic control for   <7.0% adults with diabetes   . Mean Plasma Glucose 11/03/2018 122.63  mg/dL Final   Performed at [facility] [address],  [place], [place] [address]  . Glucose-Capillary 11/03/2018 98  70 - 99 mg/dL Final  . Specimen Description 11/03/2018 FLUID SYNOVIAL RIGHT KNEE   Final  . Special Requests 11/03/2018    Final                   Value:NONE Performed at [facility], [place] [address] [place], [place] [address]   . Gram Stain 11/03/2018    Final                   Value:MODERATE WBC PRESENT, PREDOMINANTLY PMN NO ORGANISMS SEEN   . Culture 11/03/2018    Final                   Value:RARE PSEUDOMONAS AERUGINOSA CRITICAL RESULT CALLED TO, READ BACK BY AND VERIFIED WITH: RN Anderson Malta MILLER [id number] FCP  Performed at [facility], [place] [address], [place], [place] [address]   . Report Status 11/03/2018 11/06/2018 FINAL   Final  . Organism ID, Bacteria 11/03/2018 PSEUDOMONAS AERUGINOSA   Final  . Specimen Description 11/03/2018 FLUID SYNOVIAL RIGHT KNEE   Final  . Special Requests 11/03/2018 SWAB   Final  . Culture 11/03/2018    Final  Value:No growth aerobically or anaerobically. Performed at [facility], [place] [address], [place], [place] [address]   . Report Status 11/03/2018 11/08/2018 FINAL   Final  . WBC 11/04/2018 8.7  4.0 - 10.5 K/uL Final  . RBC 11/04/2018 4.15  3.87 - 5.11 MIL/uL Final  . Hemoglobin 11/04/2018 10.2* 12.0 - 15.0 g/dL Final  . HCT 11/04/2018 34.6* 36.0 - 46.0 % Final  . MCV 11/04/2018 83.4  80.0 - 100.0 fL Final  . MCH 11/04/2018 24.6* 26.0 - 34.0 pg Final  . MCHC 11/04/2018 29.5* 30.0 - 36.0 g/dL Final  . RDW 11/04/2018 15.4  11.5 - 15.5 % Final  . Platelets 11/04/2018 304  150 - 400 K/uL Final  . nRBC 11/04/2018 0.0  0.0 - 0.2 % Final   Performed at [facility], [place] [address] [place], [place] [address]  . Sodium 11/04/2018 136  135 - 145 mmol/L Final  . Potassium 11/04/2018 4.3  3.5 - 5.1 mmol/L Final  . Chloride 11/04/2018 105  98 - 111 mmol/L Final  . CO2 11/04/2018 25  22 - 32 mmol/L Final  . Glucose, Bld  11/04/2018 175* 70 - 99 mg/dL Final  . BUN 11/04/2018 20  8 - 23 mg/dL Final  . Creatinine, Ser 11/04/2018 0.83  0.44 - 1.00 mg/dL Final  . Calcium 11/04/2018 8.5* 8.9 - 10.3 mg/dL Final  . GFR calc non Af Amer 11/04/2018 >60  >60 mL/min Final  . GFR calc Af Amer 11/04/2018 >60  >60 mL/min Final  . Anion gap 11/04/2018 6  5 - 15 Final   Performed at [facility], [place] [address] [place], [place] [address]  . WBC 11/05/2018 7.5  4.0 - 10.5 K/uL Final  . RBC 11/05/2018 3.74* 3.87 - 5.11 MIL/uL Final  . Hemoglobin 11/05/2018 9.4* 12.0 - 15.0 g/dL Final  . HCT 11/05/2018 31.6* 36.0 - 46.0 % Final  . MCV 11/05/2018 84.5  80.0 - 100.0 fL Final  . MCH 11/05/2018 25.1* 26.0 - 34.0 pg Final  . MCHC 11/05/2018 29.7* 30.0 - 36.0 g/dL Final  . RDW 11/05/2018 15.6* 11.5 - 15.5 % Final  . Platelets 11/05/2018 275  150 - 400 K/uL Final  . nRBC 11/05/2018 0.0  0.0 - 0.2 % Final   Performed at [facility], [place] [address], [place], [place] [address]  . Sodium 11/05/2018 138  135 - 145 mmol/L Final  . Potassium 11/05/2018 4.2  3.5 - 5.1 mmol/L Final  . Chloride 11/05/2018 106  98 - 111 mmol/L Final  . CO2 11/05/2018 25  22 - 32 mmol/L Final  . Glucose, Bld 11/05/2018 123* 70 - 99 mg/dL Final  . BUN 11/05/2018 22  8 - 23 mg/dL Final  . Creatinine, Ser 11/05/2018 0.86  0.44 - 1.00 mg/dL Final  . Calcium 11/05/2018 8.3* 8.9 - 10.3 mg/dL Final  . GFR calc non Af Amer 11/05/2018 >60  >60 mL/min Final  . GFR calc Af Amer 11/05/2018 >60  >60 mL/min Final  . Anion gap 11/05/2018 7  5 - 15 Final   Performed at [facility], [place] [address], [place],  [address]  . WBC 11/06/2018 7.4  4.0 - 10.5 K/uL Final  . RBC 11/06/2018 3.77* 3.87 - 5.11 MIL/uL Final  . Hemoglobin 11/06/2018 9.3* 12.0 - 15.0 g/dL Final  . HCT 11/06/2018 31.0* 36.0 - 46.0 % Final  . MCV 11/06/2018 82.2  80.0 - 100.0 fL Final  .  MCH 11/06/2018 24.7* 26.0 - 34.0 pg Final  . MCHC  11/06/2018 30.0  30.0 - 36.0 g/dL Final  . RDW 11/06/2018 15.6* 11.5 - 15.5 % Final  . Platelets 11/06/2018 289  150 - 400 K/uL Final  . nRBC 11/06/2018 0.0  0.0 - 0.2 % Final   Performed at [facility], [place] [address], [place], [place] [address]  . Sed Rate 11/06/2018 73* 0 - 22 mm/hr Final   Performed at [facility] [address], [place], [place] [address]  . CRP 11/06/2018 8.1* <1.0 mg/dL Final   Performed at [facility] [address], [place], [place] [address]  . HIV Screen 4th Generation wRfx 11/06/2018 Non Reactive  Non Reactive Final   Comment: (NOTE) Performed At: [facility] [address] [place], [place] [id number] Rush Farmer MD [id number]   . HCV Ab 11/06/2018 0.2  0.0 - 0.9 s/co ratio Final   Comment: (NOTE) Performed At: [facility] [place], [place] [id number] Rush Farmer MD [id number]   . Comment: 11/06/2018 Comment   Final   Comment: (NOTE) Non reactive HCV antibody screen is consistent with no HCV infection, unless recent infection is suspected or other evidence exists to indicate HCV infection. Performed At: [facility] [place], [place] [id number] Rush Farmer MD [id number]   Hospital Outpatient Visit on 10/27/2018  Component Date Value Ref Range Status  . aPTT 10/27/2018 35  24 - 36 seconds Final   Performed at [facility], [place] [address], [place], [place] [address]  . WBC 10/27/2018 7.4  4.0 - 10.5 K/uL Final  . RBC 10/27/2018 4.74  3.87 - 5.11 MIL/uL Final  . Hemoglobin 10/27/2018 11.9* 12.0 - 15.0 g/dL Final  . HCT 10/27/2018 40.5  36.0 - 46.0 % Final  . MCV 10/27/2018 85.4  80.0 - 100.0 fL Final  . MCH 10/27/2018 25.1* 26.0 - 34.0 pg Final  . MCHC 10/27/2018 29.4* 30.0 - 36.0 g/dL Final  . RDW 10/27/2018 15.8* 11.5 - 15.5 % Final  . Platelets 10/27/2018 345  150 - 400 K/uL Final  . nRBC  10/27/2018 0.0  0.0 - 0.2 % Final   Performed at [facility], [place] [address], [place], [place] [address]  . Sodium 10/27/2018 142  135 - 145 mmol/L Final  . Potassium 10/27/2018 4.3  3.5 - 5.1 mmol/L Final  . Chloride 10/27/2018 106  98 - 111 mmol/L Final  . CO2 10/27/2018 26  22 - 32 mmol/L Final  . Glucose, Bld 10/27/2018 130* 70 - 99 mg/dL Final  . BUN 10/27/2018 19  8 - 23 mg/dL Final  . Creatinine, Ser 10/27/2018 0.93  0.44 - 1.00 mg/dL Final  . Calcium 10/27/2018 9.2  8.9 - 10.3 mg/dL Final  . Total Protein 10/27/2018 8.1  6.5 - 8.1 g/dL Final  . Albumin 10/27/2018 4.0  3.5 - 5.0 g/dL Final  . AST 10/27/2018 17  15 - 41 U/L Final  . ALT 10/27/2018 11  0 - 44 U/L Final  . Alkaline Phosphatase 10/27/2018 95  38 - 126 U/L Final  . Total Bilirubin 10/27/2018 1.0  0.3 - 1.2 mg/dL Final  . GFR calc non Af Amer 10/27/2018 59* >60 mL/min Final  . GFR calc Af Amer 10/27/2018 >60  >60 mL/min Final   Comment: (NOTE) The eGFR has been calculated using the CKD EPI equation. This calculation has not been validated in all  clinical situations. eGFR's persistently <60 mL/min signify possible Chronic Kidney Disease.   Georgiann Hahn gap 10/27/2018 10  5 - 15 Final   Performed at [facility], [place] [address], [place], [place] [address]  . Prothrombin Time 10/27/2018 12.8  11.4 - 15.2 seconds Final  . INR 10/27/2018 0.97   Final   Performed at [facility], [place] [address] [place], [place] [address]  . ABO/RH(D) 10/27/2018 O POS   Final  . Antibody Screen 10/27/2018 NEG   Final  . Sample Expiration 10/27/2018 11/06/2018   Final  . Extend sample reason 10/27/2018    Final                   Value:NO TRANSFUSIONS OR PREGNANCY IN THE PAST 3 MONTHS Performed at [facility], [place] [address], [place], [place] [address]   . MRSA, PCR 10/27/2018 NEGATIVE  NEGATIVE Final  . Staphylococcus aureus 10/27/2018 NEGATIVE  NEGATIVE Final    Comment: (NOTE) The Xpert SA Assay (FDA approved for NASAL specimens in patients 80 years of age and older), is one component of a comprehensive surveillance program. It is not intended to diagnose infection nor to guide or monitor treatment. Performed at [facility], [place] [address], [place], [place] [address]   . Glucose-Capillary 10/27/2018 88  70 - 99 mg/dL Final  Office Visit on 09/28/2018  Component Date Value Ref Range Status  . WBC 09/28/2018 6.5  3.4 - 10.8 x10E3/uL Final  . RBC 09/28/2018 4.29  3.77 - 5.28 x10E6/uL Final  . Hemoglobin 09/28/2018 10.7* 11.1 - 15.9 g/dL Final  . Hematocrit 09/28/2018 34.3  34.0 - 46.6 % Final  . MCV 09/28/2018 80  79 - 97 fL Final  . MCH 09/28/2018 24.9* 26.6 - 33.0 pg Final  . MCHC 09/28/2018 31.2* 31.5 - 35.7 g/dL Final  . RDW 09/28/2018 15.1  12.3 - 15.4 % Final  . Platelets 09/28/2018 385  150 - 450 x10E3/uL Final  . Neutrophils 09/28/2018 66  Not Estab. % Final  . Lymphs 09/28/2018 20  Not Estab. % Final  . Monocytes 09/28/2018 10  Not Estab. % Final  . Eos 09/28/2018 3  Not Estab. % Final  . Basos 09/28/2018 1  Not Estab. % Final  . Neutrophils Absolute 09/28/2018 4.3  1.4 - 7.0 x10E3/uL Final  . Lymphocytes Absolute 09/28/2018 1.3  0.7 - 3.1 x10E3/uL Final  . Monocytes Absolute 09/28/2018 0.7  0.1 - 0.9 x10E3/uL Final  . EOS (ABSOLUTE) 09/28/2018 0.2  0.0 - 0.4 x10E3/uL Final  . Basophils Absolute 09/28/2018 0.1  0.0 - 0.2 x10E3/uL Final  . Immature Granulocytes 09/28/2018 0  Not Estab. % Final  . Immature Grans (Abs) 09/28/2018 0.0  0.0 - 0.1 x10E3/uL Final  . Glucose 09/28/2018 88  65 - 99 mg/dL Final  . BUN 09/28/2018 25  8 - 27 mg/dL Final  . Creatinine, Ser 09/28/2018 1.19* 0.57 - 1.00 mg/dL Final  . GFR calc non Af Amer 09/28/2018 45* >59 mL/min/1.73 Final  . GFR calc Af Amer 09/28/2018 52* >59 mL/min/1.73 Final  . BUN/Creatinine Ratio 09/28/2018 21  12 - 28 Final  . Sodium 09/28/2018 139  134 - 144 mmol/L  Final  . Potassium 09/28/2018 4.4  3.5 - 5.2 mmol/L Final  . Chloride 09/28/2018 100  96 - 106 mmol/L Final  . CO2 09/28/2018 24  20 - 29 mmol/L Final  . Calcium 09/28/2018 9.4  8.7 - 10.3  mg/dL Final  . Total Protein 09/28/2018 6.9  6.0 - 8.5 g/dL Final  . Albumin 09/28/2018 4.0  3.5 - 4.8 g/dL Final  . Globulin, Total 09/28/2018 2.9  1.5 - 4.5 g/dL Final  . Albumin/Globulin Ratio 09/28/2018 1.4  1.2 - 2.2 Final  . Bilirubin Total 09/28/2018 0.4  0.0 - 1.2 mg/dL Final  . Alkaline Phosphatase 09/28/2018 94  39 - 117 IU/L Final  . AST 09/28/2018 12  0 - 40 IU/L Final  . ALT 09/28/2018 7  0 - 32 IU/L Final  . Cholesterol, Total 09/28/2018 145  100 - 199 mg/dL Final  . Triglycerides 09/28/2018 134  0 - 149 mg/dL Final  . HDL 09/28/2018 59  >39 mg/dL Final  . VLDL Cholesterol Cal 09/28/2018 27  5 - 40 mg/dL Final  . LDL Calculated 09/28/2018 59  0 - 99 mg/dL Final  . Chol/HDL Ratio 09/28/2018 2.5  0.0 - 4.4 ratio Final   Comment:                                   T. Chol/HDL Ratio                                             Men  Women                               1/2 Avg.Risk  3.4    3.3                                   Avg.Risk  5.0    4.4                                2X Avg.Risk  9.6    7.1                                3X Avg.Risk 23.4   11.0   . Creatinine, Urine 09/28/2018 262.9  Not Estab. mg/dL Final  . Microalbumin, Urine 09/28/2018 426.5  Not Estab. ug/mL Final   Comment: Results confirmed on dilution.   . Microalb/Creat Ratio 09/28/2018 162.2* 0.0 - 30.0 mg/g creat Final   Comment:                      Normal:                0.0 -  30.0                      Albuminuria:          31.0 - 300.0                      Clinical albuminuria:       >300.0   . Specific Gravity, UA 09/28/2018 1.020  1.005 - 1.030 Final  . pH, UA 09/28/2018 5.0  5.0 - 7.5 Final  . Color, UA 09/28/2018 Yellow  Yellow Final  . Appearance Ur 09/28/2018 Clear  Clear Final  .  Leukocytes, UA  09/28/2018 Negative  Negative Final  . Protein, UA 09/28/2018 2+* Negative/Trace Final  . Glucose, UA 09/28/2018 Negative  Negative Final  . Ketones, UA 09/28/2018 1+* Negative Final  . RBC, UA 09/28/2018 Negative  Negative Final  . Bilirubin, UA 09/28/2018 Negative  Negative Final  . Urobilinogen, Ur 09/28/2018 0.2  0.2 - 1.0 mg/dL Final  . Nitrite, UA 09/28/2018 Negative  Negative Final  . HB A1C (BAYER DCA - WAIVED) 09/28/2018 5.9  <7.0 % Final   Comment:                                       Diabetic Adult            <7.0                                       Healthy Adult        4.3 - 5.7                                                           (DCCT/NGSP) American Diabetes Association's Summary of Glycemic Recommendations for Adults with Diabetes: Hemoglobin A1c <7.0%. More stringent glycemic goals (A1c <6.0%) may further reduce complications at the cost of increased risk of hypoglycemia.      X-Rays:Us Ekg Site Rite  Result Date: 11/05/2018 If [employer] not attached, placement could not be confirmed due to current cardiac rhythm.   EKG: Orders placed or performed in visit on 10/27/18  . EKG 12-Lead     Hospital Course: CONCHA SUDOL is a 72 y.o. who was admitted to [facility]. They were brought to the operating room on 11/03/2018 and underwent Procedure(s): Right knee resection arthroplasty; antibiotic spacer.  Patient tolerated the procedure well and was later transferred to the recovery room and then to the orthopaedic floor for postoperative care. They were given PO and IV analgesics for pain control following their surgery. They were given 24 hours of postoperative antibiotics of  Anti-infectives (From admission, onward)   Start     Dose/Rate Route Frequency Ordered Stop   11/07/18 0000  ceFEPime (MAXIPIME) IVPB     2 g Intravenous Every 8 hours 11/07/18 0847 12/19/18 2359   11/07/18 0000  ceFEPIme 2 g in sodium chloride 0.9 % 100 mL     2 g 200 mL/hr  over 30 Minutes Intravenous Every 8 hours 11/07/18 0847     11/06/18 1400  ceFEPIme (MAXIPIME) 2 g in sodium chloride 0.9 % 100 mL IVPB     2 g 200 mL/hr over 30 Minutes Intravenous Every 8 hours 11/06/18 1102     11/05/18 1900  cefTRIAXone (ROCEPHIN) 2 g in sodium chloride 0.9 % 100 mL IVPB  Status:  Discontinued     2 g 200 mL/hr over 30 Minutes Intravenous Every 24 hours 11/05/18 1819 11/06/18 1059   11/04/18 2200  vancomycin (VANCOCIN) IVPB 1000 mg/200 mL premix  Status:  Discontinued     1,000 mg 200 mL/hr over 60 Minutes Intravenous Every 24 hours 11/03/18 1922 11/06/18 1102   11/03/18 2000  vancomycin (  VANCOCIN) 2,000 mg in sodium chloride 0.9 % 500 mL IVPB     2,000 mg 250 mL/hr over 120 Minutes Intravenous  Once 11/03/18 1916 11/04/18 0131   11/03/18 1130  ceFAZolin (ANCEF) IVPB 2g/100 mL premix     2 g 200 mL/hr over 30 Minutes Intravenous On call to O.R. 11/03/18 1123 11/03/18 1331     and started on DVT prophylaxis in the form of Xarelto.   PT and OT were ordered for total joint protocol. Discharge planning consulted to help with postop disposition and equipment needs. Patient had a good night on the evening of surgery. They started to get up OOB with therapy on POD #0. Worked with therapy into POD #2. Hemovac drain was pulled without difficulty on day two, and incision was clean, dry, and intact with no drainage. Intra-op gram-stain showed predominantly PMNs with no growth on the culture at 12 hours. Infectious disease was consulted for long-term antibiotic management and orders were placed for PICC line placement. Patient continued working with therapy over the weekend while awaiting final culture results and recommendations from ID for IV abx. Patient was seen during rounds on POD #5 and was ready for discharge. Incision was healing well. The original plan was for patient to discharge home with sister, however sister became sick with the flu. Discussed situation with patient and due  to lack of support at home she would require discharge to SNF. Final cultures came back showing Pseudomonas aeruginosa, with infectious disease specialist Dr. Lucianne Lei Dam's recommendations:   Discharge antibiotics: Cefepime Duration:6 weeks End Date:January the 10th 2020  [place] Per Protocol: Labs weekly while on IV antibiotics: _x_ CBC with differential x__ BMP _x_ CRP _x_ ESR x__ Please pull PIC at completion of IV antibiotics  Patient continued to work with therapy until bed became available at [facility]. On day #6, patient began to complain of significant pain in her right thumb/wrist. Had history of gout flare after surgery in August, for which prednisone dose pack was very effective. Prednisone 5 mg dose pack ordered with improvement in symptoms. She was discharged later that day on POD #6 in stable condition.  Diet: Diabetic diet Activity: WBAT Follow-up: in 2 weeks with Dr. Wynelle Link Disposition: Skilled nursing facility Discharged Condition: stable   Discharge Instructions    Call MD / Call 911   Complete by:  As directed    If you experience chest pain or shortness of breath, CALL 911 and be transported to the hospital emergency room.  If you develope a fever above 101 F, pus (white drainage) or increased drainage or redness at the wound, or calf pain, call your surgeon's office.   Change dressing   Complete by:  As directed    Change the dressing daily with sterile 4 x 4 inch gauze dressing and apply TED hose.   Constipation Prevention   Complete by:  As directed    Drink plenty of fluids.  Prune juice may be helpful.  You may use a stool softener, such as Colace (over the counter) 100 mg twice a day.  Use MiraLax (over the counter) for constipation as needed.   Diet - low sodium heart healthy   Complete by:  As directed    Do not put a pillow under the knee. Place it under the heel.   Complete by:  As directed    Driving restrictions   Complete by:  As directed    No driving  for two weeks  Home infusion instructions [facility] May follow [place] Dosing Protocol; May administer Cathflo as needed to maintain patency of vascular access device.; Flushing of vascular access device: per [facility] Protocol: 0.9% NaCl pre/post medica...   Complete by:  As directed    Instructions:  May follow [place] Dosing Protocol   Instructions:  May administer Cathflo as needed to maintain patency of vascular access device.   Instructions:  Flushing of vascular access device: per [facility] Protocol: 0.9% NaCl pre/post medication administration and prn patency; Heparin 100 u/ml, 28m for implanted ports and Heparin 10u/ml, 535mfor all other central venous catheters.   Instructions:  May follow [facility] Anaphylaxis Protocol for First Dose Administration in the home: 0.9% NaCl at 25-50 ml/hr to maintain IV access for protocol meds. Epinephrine 0.3 ml IV/IM PRN and Benadryl 25-50 IV/IM PRN s/s of anaphylaxis.   Instructions:  [facility] Coordinator (RN) to assist per patient IV care needs in the home PRN.   TED hose   Complete by:  As directed    Use stockings (TED hose) for three weeks on both leg(s).  You may remove them at night for sleeping.   Weight bearing as tolerated   Complete by:  As directed      Allergies as of 11/09/2018      Reactions   Aleve [naproxen Sodium] Other (See Comments)   Heart races   Asa [aspirin] Other (See Comments)   Nose bleeding   Clonidine Derivatives Other (See Comments)   Dizziness and weakness   Shellfish Allergy Nausea And Vomiting      Medication List    STOP taking these medications   multivitamin with minerals Tabs tablet   VITAMIN B-12 PO     TAKE these medications   acetaminophen 500 MG tablet Commonly known as:  TYLENOL Take 2 tablets (1,000 mg total) by mouth every 6 (six) hours as needed for mild pain.   amLODipine 10 MG tablet Commonly known as:  NORVASC TAKE 1 TABLET DAILY IN THE EVENING What changed:     how much to take  how to take this  when to take this  additional instructions   atorvastatin 20 MG tablet Commonly known as:  LIPITOR Take 1 tablet (20 mg total) by mouth every evening.   ceFEPime  IVPB Commonly known as:  MAXIPIME Inject 2 g into the vein every 8 (eight) hours. Indication: Pseudomonas Prosthetic Joint Infection Last Day of Therapy:  12/18/18 Labs - Once weekly:  CBC/D and BMP, Labs - Every other week:  ESR and CRP   ceFEPIme 2 g in sodium chloride 0.9 % 100 mL Inject 2 g into the vein every 8 (eight) hours.   DULCOLAX PO Take by mouth.   ferrous sulfate 325 (65 FE) MG tablet Take 325 mg by mouth 2 (two) times daily with a meal.   GAVILAX powder Generic drug:  polyethylene glycol powder MIX 17 GRAMS INTO 8OZ OF WATER AND DRINK DAILY What changed:  See the new instructions.   hydrALAZINE 100 MG tablet Commonly known as:  APRESOLINE TAKE ONE (1) TABLET THREE (3) TIMES EACH DAY What changed:    how much to take  how to take this  when to take this  additional instructions   labetalol 200 MG tablet Commonly known as:  NORMODYNE Take 1 tablet (200 mg total) by mouth 2 (two) times daily.   metFORMIN 500 MG 24 hr tablet Commonly known as:  GLUCOPHAGE-XR TAKE ONE (  1) TABLET EACH DAY What changed:    how much to take  how to take this  when to take this  additional instructions   methocarbamol 500 MG tablet Commonly known as:  ROBAXIN Take 1 tablet (500 mg total) by mouth every 6 (six) hours as needed for muscle spasms.   Nebivolol HCl 20 MG Tabs TAKE ONE (1) TABLET EACH DAY What changed:    how much to take  how to take this  when to take this  additional instructions   olmesartan 40 MG tablet Commonly known as:  BENICAR TAKE ONE (1) TABLET EACH DAY What changed:    how much to take  how to take this  when to take this  additional instructions   ONE TOUCH ULTRA TEST test strip Generic drug:  glucose blood Use  to check glucose up to four times daily   ONETOUCH DELICA LANCETS 75T Misc USE TO CHECK GLUCOSE UP TO  4 TIMES DAILY   oxyCODONE 5 MG immediate release tablet Commonly known as:  Oxy IR/ROXICODONE Take 1 tablet (5 mg total) by mouth every 6 (six) hours as needed for moderate pain (pain score 4-6). What changed:    how much to take  reasons to take this   predniSONE 5 MG (21) Tbpk tablet Commonly known as:  STERAPRED UNI-PAK 21 TAB Day 1 x 6, Day 2 x 5, Day 3 x 4, Day 4 x 3, Day 5 x 2, Day 6 x 1 Start taking on:  11/11/2018   rivaroxaban 10 MG Tabs tablet Commonly known as:  XARELTO Take 1 tablet (10 mg total) by mouth daily with breakfast. Take one tablet xarelto 10 mg once a day for three weeks following surgery. Then take one 81 mg aspirin once a day for three weeks. Then discontinue aspirin.   traMADol 50 MG tablet Commonly known as:  ULTRAM Take 1-2 tablets (50-100 mg total) by mouth every 6 (six) hours as needed for moderate pain (not responding to oxycodone).            Home Infusion Instuctions  (From admission, onward)         Start     Ordered   11/07/18 0000  Home infusion instructions [facility] May follow [facility] Dosing Protocol; May administer Cathflo as needed to maintain patency of vascular access device.; Flushing of vascular access device: per [facility] Protocol: 0.9% NaCl pre/post medica...    Question Answer Comment  Instructions May follow [facility] Dosing Protocol   Instructions May administer Cathflo as needed to maintain patency of vascular access device.   Instructions Flushing of vascular access device: per [facility] Protocol: 0.9% NaCl pre/post medication administration and prn patency; Heparin 100 u/ml, 81m for implanted ports and Heparin 10u/ml, 569mfor all other central venous catheters.   Instructions May follow [facility] Anaphylaxis Protocol for First Dose Administration in the home: 0.9% NaCl at 25-50 ml/hr to maintain IV access for protocol  meds. Epinephrine 0.3 ml IV/IM PRN and Benadryl 25-50 IV/IM PRN s/s of anaphylaxis.   Instructions [facility] Infusion Coordinator (RN) to assist per patient IV care needs in the home PRN.      11/07/18 0847           Durable Medical Equipment  (From admission, onward)         Start     Ordered   11/06/18 1418  For home use only DME 3 n 1  Once  11/06/18 1417           Discharge Care Instructions  (From admission, onward)         Start     Ordered   11/05/18 0000  Weight bearing as tolerated     11/05/18 0844   11/05/18 0000  Change dressing    Comments:  Change the dressing daily with sterile 4 x 4 inch gauze dressing and apply TED hose.   11/05/18 0844          Contact information for follow-up providers    [name], MD. Schedule an appointment as soon as possible for a visit on 11/18/2018.   Specialty:  Orthopedic Surgery Contact information: [address] [place] [address] [phone]        [facility] Follow up.   Specialty:  Home Health Services Why:  Home Health Physical Therapy and RN- agency will call to arrange appointment Contact information: [place] [address] [phone]        Tommy Medal, Lavell Islam, MD Follow up.   Specialty:  Infectious Diseases Why:  12/15/18 @ 9:45 am. Please call our office to reschedule if you are not able to make this appointment.  Contact information: [address] [place] [address] [phone]            Contact information for after-discharge care    Destination    [facility] .   Service:  Skilled Nursing Contact information: [place] [phone]                  Signed: Theresa Duty, PA-C Orthopedic Surgery 11/09/2018, 2:12 PM

## 2018-11-08 NOTE — Progress Notes (Signed)
[facility] started at [employer].  Authorization Pending. CSW will inform the medical team when received.   Kathrin Greathouse, Marlinda Mike, MSW Clinical Social Worker  [phone] 11/08/2018  11:50 AM

## 2018-11-08 NOTE — Evaluation (Signed)
Occupational Therapy Evaluation Patient Details Name: Beth [name] MRN: [id number] DOB: Aug 22, 1946 Today's Date: 11/08/2018    History of Present Illness Pt is a 72 YO female admitted for R TK resection arthroplasty with placement of antibiotic spacer. Pt's R TKR complications include R TKR in 2015, I&D and polyexchange 2015, I&D and polyexchange 2016, reimplantation 2016, and abscess 2018. Pt with PMH of anemia, OA, cataracts, CKD III, CVA 2006, DM II, heart murmur, HLD, HTN. Surgical history includes carpal tunnel release and previously listed R knee surgeries.   Clinical Impression   Pt admitted for R knee resection. Pt currently with functional limitations due to the deficits listed below (see OT Problem List).  Pt will benefit from skilled OT to increase their safety and independence with ADL and functional mobility for ADL to facilitate discharge to venue listed below.      Follow Up Recommendations  SNF    Equipment Recommendations  None recommended by OT    Recommendations for Other Services       Precautions / Restrictions Precautions Precautions: Fall Required Braces or Orthoses: Knee Immobilizer - Right Knee Immobilizer - Right: On at all times Restrictions Other Position/Activity Restrictions: WBAT      Mobility Bed Mobility Overal bed mobility: Needs Assistance Bed Mobility: Supine to Sit;Sit to Supine     Supine to sit: Min assist;HOB elevated;Mod assist Sit to supine: Mod assist   General bed mobility comments: assist for R LE and assist to complete scooting to EOB    Transfers                      Balance Overall balance assessment: Needs assistance   Sitting balance-[name]: Fair   Postural control: Posterior lean                                 ADL either performed or assessed with clinical judgement   ADL Overall ADL's : Needs assistance/impaired Eating/Feeding: Supervision/ safety;Sitting   Grooming: Wash/dry  face;Brushing hair;Sitting;Set up   Upper Body Bathing: Set up;Sitting   Lower Body Bathing: Moderate assistance;Sitting/lateral leans   Upper Body Dressing : Set up;Sitting   Lower Body Dressing: Moderate assistance;Sitting/lateral leans Lower Body Dressing Details (indicate cue type and reason): did not stand               General ADL Comments: OT eval sitting EOb only- pt declined standing EOB at thhis time                  Pertinent Vitals/Pain Pain Score: 3  Pain Location: R knee with  movement Pain Descriptors / Indicators: Discomfort;Sore Pain Intervention(s): Limited activity within patient's tolerance;Repositioned     Hand Dominance Right   Extremity/Trunk Assessment     Lower Extremity Assessment RLE Deficits / Details: ankle AROM WFL, able to perform quad set, limited extension to about 20, flexion NT LLE Deficits / Details: arthritic changes noted in knee joint, moves about 65 degrees in supine with some crepitus noted       Communication Communication Communication: No difficulties   Cognition Arousal/Alertness: Awake/alert Behavior During Therapy: WFL for tasks assessed/performed Overall Cognitive Status: Within Functional Limits for tasks assessed  Home Living Family/patient expects to be discharged to:: [place]: Other relatives(grandaughter) Available Help at Discharge: Family;Available 24 hours/day Type of Home: House Home Access: Ramped entrance     Home Layout: One level     Bathroom Shower/Tub: Walk-in shower         Home Equipment: Bedside commode;Walker - 2 wheels;Cane - single point;Shower seat;Wheelchair - manual          Prior Functioning/Environment Level of Independence: Needs assistance  Gait / Transfers Assistance Needed: walker in the home for 10-15 ft distances, uses w/c to get groceries with granddaughter                OT Problem List: Decreased strength;Decreased activity tolerance;Decreased knowledge of precautions;Decreased knowledge of use of DME or AE;Impaired balance (sitting and/or standing)      OT Treatment/Interventions: Self-care/ADL training;Patient/family education;DME and/or AE instruction;Therapeutic activities    OT Goals(Current goals can be found in the care plan section) Acute Rehab OT Goals Patient Stated Goal: to go home OT Goal Formulation: With patient Time For Goal Achievement: 11/22/18 Potential to Achieve Goals: Good  OT Frequency: Min 2X/week   Barriers to D/C:               AM-PAC OT "6 Clicks" Daily Activity     Outcome Measure Help from another person eating meals?: None Help from another person taking care of personal grooming?: A Little Help from another person toileting, which includes using toliet, bedpan, or urinal?: A Lot Help from another person bathing (including washing, rinsing, drying)?: A Lot Help from another person to put on and taking off regular upper body clothing?: A Little Help from another person to put on and taking off regular lower body clothing?: A Lot 6 Click Score: 16   End of Session Equipment Utilized During Treatment: Right knee immobilizer Nurse Communication: Mobility status  Activity Tolerance: Patient limited by fatigue Patient left: in bed;with call bell/phone within reach  OT Visit Diagnosis: Unsteadiness on feet (R26.81);Muscle weakness (generalized) (M62.81);Other abnormalities of gait and mobility (R26.89);History of falling (Z91.81)                Time: 3244-0102 OT Time Calculation (min): 20 min Charges:  OT General Charges $OT Visit: 1 Visit OT Evaluation $OT Eval Low Complexity: 1 Low  Kari Baars, OT Acute Rehabilitation Services [phone] Office- [phone], Edwena Felty D 11/08/2018, 1:27 PM

## 2018-11-09 DIAGNOSIS — K5900 Constipation, unspecified: Secondary | ICD-10-CM | POA: Diagnosis not present

## 2018-11-09 DIAGNOSIS — D509 Iron deficiency anemia, unspecified: Secondary | ICD-10-CM | POA: Diagnosis not present

## 2018-11-09 DIAGNOSIS — Z79899 Other long term (current) drug therapy: Secondary | ICD-10-CM | POA: Diagnosis not present

## 2018-11-09 DIAGNOSIS — E119 Type 2 diabetes mellitus without complications: Secondary | ICD-10-CM | POA: Diagnosis not present

## 2018-11-09 DIAGNOSIS — Z4789 Encounter for other orthopedic aftercare: Secondary | ICD-10-CM | POA: Diagnosis not present

## 2018-11-09 DIAGNOSIS — I10 Essential (primary) hypertension: Secondary | ICD-10-CM | POA: Diagnosis not present

## 2018-11-09 DIAGNOSIS — B965 Pseudomonas (aeruginosa) (mallei) (pseudomallei) as the cause of diseases classified elsewhere: Secondary | ICD-10-CM | POA: Diagnosis not present

## 2018-11-09 DIAGNOSIS — R52 Pain, unspecified: Secondary | ICD-10-CM | POA: Diagnosis not present

## 2018-11-09 DIAGNOSIS — H269 Unspecified cataract: Secondary | ICD-10-CM | POA: Diagnosis not present

## 2018-11-09 DIAGNOSIS — M00861 Arthritis due to other bacteria, right knee: Secondary | ICD-10-CM | POA: Diagnosis not present

## 2018-11-09 DIAGNOSIS — R011 Cardiac murmur, unspecified: Secondary | ICD-10-CM | POA: Diagnosis not present

## 2018-11-09 DIAGNOSIS — Z8673 Personal history of transient ischemic attack (TIA), and cerebral infarction without residual deficits: Secondary | ICD-10-CM | POA: Diagnosis not present

## 2018-11-09 DIAGNOSIS — M1712 Unilateral primary osteoarthritis, left knee: Secondary | ICD-10-CM | POA: Diagnosis not present

## 2018-11-09 DIAGNOSIS — Z4733 Aftercare following explantation of knee joint prosthesis: Secondary | ICD-10-CM | POA: Diagnosis not present

## 2018-11-09 DIAGNOSIS — M1A00X0 Idiopathic chronic gout, unspecified site, without tophus (tophi): Secondary | ICD-10-CM | POA: Diagnosis not present

## 2018-11-09 DIAGNOSIS — T8459XA Infection and inflammatory reaction due to other internal joint prosthesis, initial encounter: Secondary | ICD-10-CM | POA: Diagnosis not present

## 2018-11-09 DIAGNOSIS — Z452 Encounter for adjustment and management of vascular access device: Secondary | ICD-10-CM | POA: Diagnosis not present

## 2018-11-09 DIAGNOSIS — Z7984 Long term (current) use of oral hypoglycemic drugs: Secondary | ICD-10-CM | POA: Diagnosis not present

## 2018-11-09 DIAGNOSIS — E785 Hyperlipidemia, unspecified: Secondary | ICD-10-CM | POA: Diagnosis not present

## 2018-11-09 MED ORDER — HEPARIN SOD (PORK) LOCK FLUSH 100 UNIT/ML IV SOLN
250.0000 [IU] | INTRAVENOUS | Status: AC | PRN
  Administered 2018-11-09: 250 [IU]

## 2018-11-09 MED ORDER — PREDNISONE 5 MG (21) PO TBPK: ORAL_TABLET | ORAL | 0 refills | Status: DC

## 2018-11-09 MED ORDER — PREDNISONE 5 MG (21) PO TBPK: 5.0000 mg | ORAL_TABLET | Freq: Three times a day (TID) | ORAL | Status: DC

## 2018-11-09 MED ORDER — PREDNISONE 5 MG (21) PO TBPK: 10.0000 mg | ORAL_TABLET | Freq: Every evening | ORAL | Status: DC

## 2018-11-09 MED ORDER — PREDNISONE 5 MG (21) PO TBPK: 5.0000 mg | ORAL_TABLET | ORAL | Status: DC

## 2018-11-09 MED ORDER — PREDNISONE 5 MG (21) PO TBPK
10.0000 mg | ORAL_TABLET | Freq: Every morning | ORAL | Status: DC
  Filled 2018-11-09: qty 21

## 2018-11-09 MED ORDER — PREDNISONE 5 MG (21) PO TBPK: 5.0000 mg | ORAL_TABLET | Freq: Four times a day (QID) | ORAL | Status: DC

## 2018-11-09 NOTE — Progress Notes (Signed)
Received consult to check PICC line (pump beeping occluded). Cap changed, brisk blood return noted, no resistance when flushing. No kinks noted in IV tubing. Requested RN place new consult if this reoccurs.

## 2018-11-09 NOTE — Plan of Care (Signed)
Plan of care reviewed and discussed with the patient. 

## 2018-11-09 NOTE — Care Management Important Message (Signed)
Important Message  Patient Details  Name: Beth [name] MRN: [id number] Date of Birth: [birth year]   Medicare Important Message Given:  Yes    Kerin Salen 11/09/2018, 12:20 [place] Message  Patient Details  Name: Beth [name] MRN: [id number] Date of Birth: [birth year]   Medicare Important Message Given:  Yes    Kerin Salen 11/09/2018, 12:20 PM

## 2018-11-09 NOTE — Progress Notes (Signed)
Occupational Therapy Treatment Patient Details Name: Beth [name] MRN: [id number] DOB: 12/12/1945 Today's Date: 11/09/2018    History of present illness Pt is a 72 YO female admitted for R TK resection arthroplasty with placement of antibiotic spacer. Pt's R TKR complications include R TKR in 2015, I&D and polyexchange 2015, I&D and polyexchange 2016, reimplantation 2016, and abscess 2018. Pt with PMH of anemia, OA, cataracts, CKD III, CVA 2006, DM II, heart murmur, HLD, HTN. Surgical history includes carpal tunnel release and previously listed R knee surgeries.   OT comments  Pt needed much A this day. Pt also with R thumb pain with movement. Noted edema - RN notified  Follow Up Recommendations  SNF    Equipment Recommendations  None recommended by OT    Recommendations for Other Services      Precautions / Restrictions Precautions Precautions: Fall Precaution Comments: L knee buckles with gait Required Braces or Orthoses: Knee Immobilizer - Right Knee Immobilizer - Right: On at all times Restrictions Weight Bearing Restrictions: No Other Position/Activity Restrictions: WBAT       Mobility Bed Mobility Overal bed mobility: Needs Assistance Bed Mobility: Supine to Sit     Supine to sit: HOB elevated;Mod assist     General bed mobility comments: increased time  Transfers Overall transfer level: Needs assistance Equipment used: Rolling walker (2 wheeled) Transfers: Sit to/from [employer] Transfers Sit to Stand: From elevated surface;+2 safety/equipment;Max assist Stand pivot transfers: +2 physical assistance;+2 safety/equipment;Max assist          Balance           Standing balance support: Bilateral upper extremity supported Standing balance-[name]: Poor Standing balance comment: UE support adn external assist  for balance due to pain, limited weight tolerance R                            ADL either performed or assessed with  clinical judgement   ADL Overall ADL's : Needs assistance/impaired Eating/Feeding: Supervision/ safety;Sitting   Grooming: Brushing hair;Sitting;Set up                   Toilet Transfer: Maximal assistance;+2 for physical assistance;+2 for safety/equipment;Stand-pivot;Cueing for sequencing;Cueing for safety;RW Toilet Transfer Details (indicate cue type and reason): bed to chair only Toileting- Clothing Manipulation and Hygiene: +2 for safety/equipment;Total assistance               Vision Patient Visual Report: No change from baseline            Cognition Arousal/Alertness: Awake/alert Behavior During Therapy: WFL for tasks assessed/performed Overall Cognitive Status: Within Functional Limits for tasks assessed                                                     Pertinent Vitals/ Pain       Pain Assessment: No/denies pain Pain Score: 4  Pain Location: R knee with  movement Pain Descriptors / Indicators: Discomfort;Sore Pain Intervention(s): Limited activity within patient's tolerance;Repositioned     Prior Functioning/Environment              Frequency  Min 2X/week        Progress Toward Goals  OT Goals(current goals can now be found in the care plan section)  Progress towards OT goals:  Progressing toward goals     Plan Discharge plan remains appropriate         AM-PAC OT "6 Clicks" Daily Activity     Outcome Measure   Help from another person eating meals?: None Help from another person taking care of personal grooming?: A Little Help from another person toileting, which includes using toliet, bedpan, or urinal?: Total Help from another person bathing (including washing, rinsing, drying)?: A Lot Help from another person to put on and taking off regular upper body clothing?: A Little Help from another person to put on and taking off regular lower body clothing?: Total 6 Click Score: 14    End of Session Equipment  Utilized During Treatment: Right knee immobilizer  OT Visit Diagnosis: Unsteadiness on feet (R26.81);Muscle weakness (generalized) (M62.81);Other abnormalities of gait and mobility (R26.89);History of falling (Z91.81)   Activity Tolerance Patient limited by fatigue   Patient Left with call bell/phone within reach;in chair   Nurse Communication Mobility status        Time: 1030-1055 OT Time Calculation (min): 25 min  Charges: OT General Charges $OT Visit: 1 Visit OT Treatments $Self Care/Home Management : 23-37 mins  Kari Baars, [place] [phone] Office- [phone]      Larraine Argo, Edwena Felty D 11/09/2018, 12:10 PM

## 2018-11-09 NOTE — Progress Notes (Signed)
Late entry for 1434: Received consult to flush/cap PICC. Pt's antibiotic infusing. Pt and family member informed IV RN will return after antibiotic infusion complete. Both voiced understanding.

## 2018-11-09 NOTE — Progress Notes (Addendum)
Attempted to call report to RN at [facility] three times with no answer from RN. I left a message with the secretary to have the RN receiving this patient call me.           Update 11/09/18 1507 Called facility again, gave report to [facility].

## 2018-11-09 NOTE — Clinical Social Work Placement (Signed)
D/C summary sent.   Patient daughter to transport.   CLINICAL SOCIAL WORK PLACEMENT  NOTE  Date:  11/09/2018  Patient Details  Name: Beth [name] MRN: [id number] Date of Birth: 1946/07/07  Clinical Social Work is seeking post-discharge placement for this patient at the [place] level of care (*CSW will initial, date and re-position this form in  chart as items are completed):  No   Patient/family provided with [place] Work Department's list of facilities offering this level of care within the geographic area requested by the patient (or if unable, by the patient's family).  Yes   Patient/family informed of their freedom to choose among providers that offer the needed level of care, that participate in Medicare, Medicaid or managed care program needed by the patient, have an available bed and are willing to accept the patient.  Yes   Patient/family informed of [place]'s ownership interest in [facility] and [facility], as well as of the fact that they are under no obligation to receive care at these facilities.  PASRR submitted to EDS on       PASRR number received on       Existing PASRR number confirmed on 11/09/18     FL2 transmitted to all facilities in geographic area requested by pt/family on       FL2 transmitted to all facilities within larger geographic area on 11/09/18     Patient informed that his/her managed care company has contracts with or will negotiate with certain facilities, including the following:  [employer] informed of bed offers received.  Patient chooses bed at [facility]     Physician recommends and patient chooses bed at      Patient to be transferred to [facility] on 11/09/18.  Patient to be transferred to facility by Daughter will transport.      Patient family notified on 11/09/18 of transfer.  Name of family member notified:  Patient to notify daughter      PHYSICIAN Please prepare priority discharge summary, including medications     Additional Comment:    _______________________________________________ Lia Hopping, LCSW 11/09/2018, 9:26 AM

## 2018-11-12 ENCOUNTER — Other Ambulatory Visit: Payer: Self-pay

## 2018-11-12 NOTE — Patient Outreach (Signed)
Beth [name] [facility]) Care Management  11/12/2018  Beth [name] 04-Dec-1946 [id number]   Medication Adherence call to Beth [name] patient did not answer patient is due on Metformin ER 500 mg. Beth [name] is showing past due under [facility]    Patient has a different number under [name].   Beth [name] Management Direct Dial [phone]  Fax [phone] [name]

## 2018-11-22 DIAGNOSIS — M1A00X0 Idiopathic chronic gout, unspecified site, without tophus (tophi): Secondary | ICD-10-CM | POA: Diagnosis not present

## 2018-11-22 DIAGNOSIS — T8459XA Infection and inflammatory reaction due to other internal joint prosthesis, initial encounter: Secondary | ICD-10-CM | POA: Diagnosis not present

## 2018-11-22 DIAGNOSIS — I10 Essential (primary) hypertension: Secondary | ICD-10-CM | POA: Diagnosis not present

## 2018-12-01 DIAGNOSIS — N183 Chronic kidney disease, stage 3 (moderate): Secondary | ICD-10-CM | POA: Diagnosis not present

## 2018-12-01 DIAGNOSIS — B965 Pseudomonas (aeruginosa) (mallei) (pseudomallei) as the cause of diseases classified elsewhere: Secondary | ICD-10-CM | POA: Diagnosis not present

## 2018-12-01 DIAGNOSIS — Z7984 Long term (current) use of oral hypoglycemic drugs: Secondary | ICD-10-CM | POA: Diagnosis not present

## 2018-12-01 DIAGNOSIS — E1122 Type 2 diabetes mellitus with diabetic chronic kidney disease: Secondary | ICD-10-CM | POA: Diagnosis not present

## 2018-12-01 DIAGNOSIS — Z452 Encounter for adjustment and management of vascular access device: Secondary | ICD-10-CM | POA: Diagnosis not present

## 2018-12-01 DIAGNOSIS — I129 Hypertensive chronic kidney disease with stage 1 through stage 4 chronic kidney disease, or unspecified chronic kidney disease: Secondary | ICD-10-CM | POA: Diagnosis not present

## 2018-12-01 DIAGNOSIS — Z86010 Personal history of colonic polyps: Secondary | ICD-10-CM | POA: Diagnosis not present

## 2018-12-01 DIAGNOSIS — T8453XA Infection and inflammatory reaction due to internal right knee prosthesis, initial encounter: Secondary | ICD-10-CM | POA: Diagnosis not present

## 2018-12-01 DIAGNOSIS — Z8673 Personal history of transient ischemic attack (TIA), and cerebral infarction without residual deficits: Secondary | ICD-10-CM | POA: Diagnosis not present

## 2018-12-01 DIAGNOSIS — E785 Hyperlipidemia, unspecified: Secondary | ICD-10-CM | POA: Diagnosis not present

## 2018-12-01 DIAGNOSIS — L02415 Cutaneous abscess of right lower limb: Secondary | ICD-10-CM | POA: Diagnosis not present

## 2018-12-01 DIAGNOSIS — M1712 Unilateral primary osteoarthritis, left knee: Secondary | ICD-10-CM | POA: Diagnosis not present

## 2018-12-01 DIAGNOSIS — H269 Unspecified cataract: Secondary | ICD-10-CM | POA: Diagnosis not present

## 2018-12-01 DIAGNOSIS — D509 Iron deficiency anemia, unspecified: Secondary | ICD-10-CM | POA: Diagnosis not present

## 2018-12-01 DIAGNOSIS — Z9181 History of falling: Secondary | ICD-10-CM | POA: Diagnosis not present

## 2018-12-01 DIAGNOSIS — M00861 Arthritis due to other bacteria, right knee: Secondary | ICD-10-CM | POA: Diagnosis not present

## 2018-12-01 DIAGNOSIS — M10022 Idiopathic gout, left elbow: Secondary | ICD-10-CM | POA: Diagnosis not present

## 2018-12-03 ENCOUNTER — Telehealth: Payer: Self-pay | Admitting: Family Medicine

## 2018-12-03 ENCOUNTER — Encounter (HOSPITAL_COMMUNITY): Payer: Self-pay | Admitting: Emergency Medicine

## 2018-12-03 ENCOUNTER — Telehealth: Payer: Self-pay | Admitting: Behavioral Health

## 2018-12-03 ENCOUNTER — Emergency Department (HOSPITAL_BASED_OUTPATIENT_CLINIC_OR_DEPARTMENT_OTHER): Payer: Medicare Other

## 2018-12-03 ENCOUNTER — Inpatient Hospital Stay (HOSPITAL_COMMUNITY)
Admission: EM | Admit: 2018-12-03 | Discharge: 2018-12-09 | DRG: 683 | Disposition: A | Discharge status: Skilled Nursing Facility | Payer: Medicare Other | Attending: Internal Medicine | Admitting: Internal Medicine

## 2018-12-03 DIAGNOSIS — E860 Dehydration: Secondary | ICD-10-CM | POA: Diagnosis present

## 2018-12-03 DIAGNOSIS — M109 Gout, unspecified: Secondary | ICD-10-CM | POA: Diagnosis present

## 2018-12-03 DIAGNOSIS — E876 Hypokalemia: Secondary | ICD-10-CM | POA: Diagnosis not present

## 2018-12-03 DIAGNOSIS — Z91013 Allergy to seafood: Secondary | ICD-10-CM | POA: Diagnosis not present

## 2018-12-03 DIAGNOSIS — Y831 Surgical operation with implant of artificial internal device as the cause of abnormal reaction of the patient, or of later complication, without mention of misadventure at the time of the procedure: Secondary | ICD-10-CM | POA: Diagnosis present

## 2018-12-03 DIAGNOSIS — Z8041 Family history of malignant neoplasm of ovary: Secondary | ICD-10-CM

## 2018-12-03 DIAGNOSIS — E1122 Type 2 diabetes mellitus with diabetic chronic kidney disease: Secondary | ICD-10-CM | POA: Diagnosis not present

## 2018-12-03 DIAGNOSIS — D638 Anemia in other chronic diseases classified elsewhere: Secondary | ICD-10-CM | POA: Diagnosis present

## 2018-12-03 DIAGNOSIS — Z8673 Personal history of transient ischemic attack (TIA), and cerebral infarction without residual deficits: Secondary | ICD-10-CM

## 2018-12-03 DIAGNOSIS — Z96651 Presence of right artificial knee joint: Secondary | ICD-10-CM | POA: Diagnosis not present

## 2018-12-03 DIAGNOSIS — Z7901 Long term (current) use of anticoagulants: Secondary | ICD-10-CM

## 2018-12-03 DIAGNOSIS — Z888 Allergy status to other drugs, medicaments and biological substances status: Secondary | ICD-10-CM | POA: Diagnosis not present

## 2018-12-03 DIAGNOSIS — Z886 Allergy status to analgesic agent status: Secondary | ICD-10-CM | POA: Diagnosis not present

## 2018-12-03 DIAGNOSIS — R7989 Other specified abnormal findings of blood chemistry: Secondary | ICD-10-CM | POA: Diagnosis not present

## 2018-12-03 DIAGNOSIS — Z90710 Acquired absence of both cervix and uterus: Secondary | ICD-10-CM

## 2018-12-03 DIAGNOSIS — E785 Hyperlipidemia, unspecified: Secondary | ICD-10-CM | POA: Diagnosis present

## 2018-12-03 DIAGNOSIS — I129 Hypertensive chronic kidney disease with stage 1 through stage 4 chronic kidney disease, or unspecified chronic kidney disease: Secondary | ICD-10-CM | POA: Diagnosis present

## 2018-12-03 DIAGNOSIS — N179 Acute kidney failure, unspecified: Secondary | ICD-10-CM | POA: Diagnosis not present

## 2018-12-03 DIAGNOSIS — E1165 Type 2 diabetes mellitus with hyperglycemia: Secondary | ICD-10-CM | POA: Diagnosis not present

## 2018-12-03 DIAGNOSIS — Z95828 Presence of other vascular implants and grafts: Secondary | ICD-10-CM

## 2018-12-03 DIAGNOSIS — Z6837 Body mass index (BMI) 37.0-37.9, adult: Secondary | ICD-10-CM

## 2018-12-03 DIAGNOSIS — Z833 Family history of diabetes mellitus: Secondary | ICD-10-CM | POA: Diagnosis not present

## 2018-12-03 DIAGNOSIS — M009 Pyogenic arthritis, unspecified: Secondary | ICD-10-CM | POA: Diagnosis not present

## 2018-12-03 DIAGNOSIS — N183 Chronic kidney disease, stage 3 (moderate): Secondary | ICD-10-CM | POA: Diagnosis present

## 2018-12-03 DIAGNOSIS — T8453XA Infection and inflammatory reaction due to internal right knee prosthesis, initial encounter: Secondary | ICD-10-CM | POA: Diagnosis not present

## 2018-12-03 DIAGNOSIS — Z8249 Family history of ischemic heart disease and other diseases of the circulatory system: Secondary | ICD-10-CM

## 2018-12-03 DIAGNOSIS — Z7984 Long term (current) use of oral hypoglycemic drugs: Secondary | ICD-10-CM

## 2018-12-03 DIAGNOSIS — M7989 Other specified soft tissue disorders: Secondary | ICD-10-CM

## 2018-12-03 DIAGNOSIS — E119 Type 2 diabetes mellitus without complications: Secondary | ICD-10-CM

## 2018-12-03 DIAGNOSIS — I10 Essential (primary) hypertension: Secondary | ICD-10-CM | POA: Diagnosis not present

## 2018-12-03 DIAGNOSIS — R790 Abnormal level of blood mineral: Secondary | ICD-10-CM | POA: Diagnosis not present

## 2018-12-03 DIAGNOSIS — E782 Mixed hyperlipidemia: Secondary | ICD-10-CM | POA: Diagnosis not present

## 2018-12-03 LAB — COMPREHENSIVE METABOLIC PANEL
ALT: 10 U/L (ref 0–44)
AST: 13 U/L — ABNORMAL LOW (ref 15–41)
Albumin: 3.1 g/dL — ABNORMAL LOW (ref 3.5–5.0)
Alkaline Phosphatase: 109 U/L (ref 38–126)
Anion gap: 9 (ref 5–15)
BUN: 48 mg/dL — ABNORMAL HIGH (ref 8–23)
CO2: 23 mmol/L (ref 22–32)
Calcium: 9 mg/dL (ref 8.9–10.3)
Chloride: 107 mmol/L (ref 98–111)
Creatinine, Ser: 2.54 mg/dL — ABNORMAL HIGH (ref 0.44–1.00)
GFR calc Af Amer: 21 mL/min — ABNORMAL LOW (ref >60)
GFR calc non Af Amer: 18 mL/min — ABNORMAL LOW (ref >60)
Glucose, Bld: 147 mg/dL — ABNORMAL HIGH (ref 70–99)
Potassium: 3.7 mmol/L (ref 3.5–5.1)
Sodium: 139 mmol/L (ref 135–145)
Total Bilirubin: 1.1 mg/dL (ref 0.3–1.2)
Total Protein: 7.6 g/dL (ref 6.5–8.1)

## 2018-12-03 LAB — CBC
HCT: 31.3 % — ABNORMAL LOW (ref 36.0–46.0)
Hemoglobin: 9.2 g/dL — ABNORMAL LOW (ref 12.0–15.0)
MCH: 24.7 pg — ABNORMAL LOW (ref 26.0–34.0)
MCHC: 29.4 g/dL — ABNORMAL LOW (ref 30.0–36.0)
MCV: 84.1 fL (ref 80.0–100.0)
Platelets: 267 10*3/uL (ref 150–400)
RBC: 3.72 MIL/uL — ABNORMAL LOW (ref 3.87–5.11)
RDW: 17.3 % — ABNORMAL HIGH (ref 11.5–15.5)
WBC: 6.7 10*3/uL (ref 4.0–10.5)
nRBC: 0 % (ref 0.0–0.2)

## 2018-12-03 MED ORDER — ONDANSETRON HCL 4 MG/2ML IJ SOLN
4.0000 mg | Freq: Four times a day (QID) | INTRAMUSCULAR | Status: DC | PRN
  Administered 2018-12-06: 4 mg via INTRAVENOUS
  Filled 2018-12-03: qty 2

## 2018-12-03 MED ORDER — OXYCODONE HCL 5 MG PO TABS
5.0000 mg | ORAL_TABLET | ORAL | Status: DC | PRN
  Administered 2018-12-04 – 2018-12-09 (×14): 5 mg via ORAL
  Filled 2018-12-03 (×14): qty 1

## 2018-12-03 MED ORDER — INSULIN ASPART 100 UNIT/ML ~~LOC~~ SOLN
0.0000 [IU] | Freq: Three times a day (TID) | SUBCUTANEOUS | Status: DC
  Administered 2018-12-05 (×2): 2 [IU] via SUBCUTANEOUS
  Administered 2018-12-05 – 2018-12-07 (×5): 1 [IU] via SUBCUTANEOUS
  Administered 2018-12-07: 3 [IU] via SUBCUTANEOUS
  Administered 2018-12-08: 2 [IU] via SUBCUTANEOUS
  Administered 2018-12-08 – 2018-12-09 (×2): 1 [IU] via SUBCUTANEOUS

## 2018-12-03 MED ORDER — INSULIN ASPART 100 UNIT/ML ~~LOC~~ SOLN: 0.0000 [IU] | Freq: Every day | SUBCUTANEOUS | Status: DC

## 2018-12-03 MED ORDER — ACETAMINOPHEN 650 MG RE SUPP: 650.0000 mg | Freq: Four times a day (QID) | RECTAL | Status: DC | PRN

## 2018-12-03 MED ORDER — SODIUM CHLORIDE 0.9 % IV SOLN
INTRAVENOUS | Status: AC
  Administered 2018-12-04: 02:00:00 via INTRAVENOUS

## 2018-12-03 MED ORDER — HEPARIN SODIUM (PORCINE) 5000 UNIT/ML IJ SOLN
5000.0000 [IU] | Freq: Three times a day (TID) | INTRAMUSCULAR | Status: DC
  Administered 2018-12-04 – 2018-12-09 (×18): 5000 [IU] via SUBCUTANEOUS
  Filled 2018-12-03 (×18): qty 1

## 2018-12-03 MED ORDER — SODIUM CHLORIDE 0.9 % IV BOLUS
1000.0000 mL | Freq: Once | INTRAVENOUS | Status: AC
  Administered 2018-12-03: 1000 mL via INTRAVENOUS

## 2018-12-03 MED ORDER — AMLODIPINE BESYLATE 5 MG PO TABS
10.0000 mg | ORAL_TABLET | Freq: Every evening | ORAL | Status: DC
  Administered 2018-12-04 – 2018-12-09 (×7): 10 mg via ORAL
  Filled 2018-12-03 (×7): qty 2

## 2018-12-03 MED ORDER — ATORVASTATIN CALCIUM 20 MG PO TABS
20.0000 mg | ORAL_TABLET | Freq: Every evening | ORAL | Status: DC
  Administered 2018-12-04 – 2018-12-09 (×6): 20 mg via ORAL
  Filled 2018-12-03 (×6): qty 1

## 2018-12-03 MED ORDER — ONDANSETRON HCL 4 MG PO TABS: 4.0000 mg | ORAL_TABLET | Freq: Four times a day (QID) | ORAL | Status: DC | PRN

## 2018-12-03 MED ORDER — SODIUM CHLORIDE 0.9 % IV SOLN
2.0000 g | Freq: Once | INTRAVENOUS | Status: AC
  Administered 2018-12-04: 2 g via INTRAVENOUS
  Filled 2018-12-03: qty 2

## 2018-12-03 MED ORDER — ACETAMINOPHEN 325 MG PO TABS
650.0000 mg | ORAL_TABLET | Freq: Four times a day (QID) | ORAL | Status: DC | PRN
  Administered 2018-12-04 – 2018-12-09 (×2): 650 mg via ORAL
  Filled 2018-12-03 (×2): qty 2

## 2018-12-03 MED ORDER — NEBIVOLOL HCL 10 MG PO TABS
20.0000 mg | ORAL_TABLET | Freq: Every day | ORAL | Status: DC
  Administered 2018-12-04 – 2018-12-09 (×6): 20 mg via ORAL
  Filled 2018-12-03 (×6): qty 2

## 2018-12-03 MED ORDER — HYDRALAZINE HCL 50 MG PO TABS
100.0000 mg | ORAL_TABLET | Freq: Three times a day (TID) | ORAL | Status: DC
  Administered 2018-12-04 (×2): 100 mg via ORAL
  Filled 2018-12-03 (×2): qty 2

## 2018-12-03 NOTE — Progress Notes (Signed)
Right upper extremity venous duplex exam completed. Please see preliminary notes on CV PROC under chart review. Shaniece Bussa H Kavari Parrillo(RDMS RVT) 12/03/18 7:51 PM

## 2018-12-03 NOTE — Telephone Encounter (Signed)
Patient has a pic line in her right arm. I called [facility] where patient was discharged from. BJ states that [facility] was supposed to be going out and administer the antibiotics and flush the pic line. BJ states that the social worker was going to call [facility] and see what is going on why they have not been out. Patient spoke with Dr. Maureen Ralphs office and they told her someone should be out by lunch today. I have asked the social worker to call us back once she speaks with [facility].

## 2018-12-03 NOTE — Telephone Encounter (Signed)
Dr. Elmyra Ricks called patient and requested that patient go to the ER at [facility].

## 2018-12-03 NOTE — Telephone Encounter (Signed)
Patient's son called stating his mother Beth [name] was discharged from [facility] rehab facility Monday and now her right arm where her PICC line is placed in red, hot and swollen X2 days. They state PICC line has not been flushed or no antibiotics since she left  [facility] 11/29/2018.  Advised them to take Beth [name] to the Emergency room ASAP to be evaluated  for blood clots and or infection.  They state they will go to [facility] ED for evaluation.  Also advised them Dr. On call paged to see what to do with antibiotics for the future.  They state Beth [name] has a hospital follow up appointment with Dr. Tommy [name] 12/15/2018.   [place] spokw to [place] to see what happened they states Education officer, museum at [employer] up home health services for medication and PICC care before her discharge. Made them aware patient is on her way to the emergency room  For swollen right arm at PICC site. They state they spoke to the coordinator Tim.  [facility] and they state they never received nursing or medication orders for PICC line. Spoke to [employer] copy.  Made them aware patient is on her way to the emergency room  For swollen right arm at PICC site. Pricilla Riffle RN

## 2018-12-03 NOTE — ED Notes (Signed)
ED TO INPATIENT HANDOFF REPORT  Name/Age/Gender Beth [name] 72 y.o. female  Code Status    Code Status Orders  (From admission, onward)         Start     Ordered   12/03/18 2259  Full code  Continuous     12/03/18 2300        Code Status History    Date Active Date Inactive Code Status Order ID Comments User Context   11/03/2018 1812 11/09/2018 1842 Full Code [id number]  [name], MD Inpatient   08/06/2018 1942 08/17/2018 1903 Full Code [id number]  Ardeen Jourdain, PA-C Inpatient   01/14/2017 1928 01/15/2017 1532 Full Code [id number]  [name], MD Inpatient   02/28/2016 1955 03/01/2016 2012 Full Code [id number]  Ivor Costa, MD ED   05/23/2015 2231 05/26/2015 1825 Full Code [id number]  [name], MD Inpatient   02/16/2015 2024 02/19/2015 1935 Full Code [id number]  [name], MD Inpatient   10/02/2014 1745 10/05/2014 1606 Full Code [id number]  Gearlean Alf, MD Inpatient   08/11/2014 2106 08/14/2014 1626 Full Code [id number]  Gearlean Alf, MD Inpatient   07/03/2014 1554 07/05/2014 1816 Full Code [id number]  Aluisio, Dione Plover, MD Inpatient    Advance Directive Documentation     Most Recent Value  Type of Advance Directive  Healthcare Power of Attorney  Pre-existing out of facility DNR order (yellow form or pink MOST form)  -  "MOST" Form in Place?  -      Home/SNF/Other Home  Chief Complaint right arm swelling  Level of Care/Admitting Diagnosis ED Disposition    ED Disposition Condition [place]: [facility] [100102]  Level of Care: Med-Surg [16]  Diagnosis: Acute kidney injury [facility]) [[id number]]  Admitting Physician: Lenore Cordia [[id number]]  Attending Physician: Lenore Cordia [[id number]]  PT Class (Do Not Modify): Observation [104]  PT Acc Code (Do Not Modify): Observation [10022]       Medical History Past Medical History:  Diagnosis Date  . Anemia   . Arthritis    Knee both knees  . Blood transfusion without  reported diagnosis 2012   anemia;pt denies transfusion stated was only on iron tablet  . Cataract    left  . CKD (chronic kidney disease), stage III ([place])   . CVA (cerebral infarction)    2006  . Diabetes mellitus without complication ([place])   . Family history of anesthesia complication    sister very slow to awaken after anesthesia;severe vomiting   . Gout    left elbow  . Heart murmur   . 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 08-09-14 Right eye  . Hyperlipidemia   . Hypertension   . Nocturia    3-4 times per night  . Stroke [facility]) 2006   x 1 no deficits noted     Allergies Allergies  Allergen Reactions  . Aleve [Naproxen Sodium] Other (See Comments)    Heart races  . Asa [Aspirin] Other (See Comments)    Nose bleeding  . Clonidine Derivatives Other (See Comments)    Dizziness and weakness  . Shellfish Allergy Nausea And Vomiting    IV Location/Drains/Wounds Patient Lines/Drains/Airways Status   Active Line/Drains/Airways    Name:   Placement date:   Placement time:   Site:   Days:   Peripheral IV 12/03/18 Left Antecubital   12/03/18    2059    Antecubital   less than 1  External Urinary Catheter   11/05/18    2130    -   28   Incision (Closed) 11/03/18 Knee Right   11/03/18    1510     30          Labs/Imaging Results for orders placed or performed during the hospital encounter of 12/03/18 (from the past 48 hour(s))  CBC     Status: Abnormal   Collection Time: 12/03/18  7:55 PM  Result Value Ref Range   WBC 6.7 4.0 - 10.5 K/uL   RBC 3.72 (L) 3.87 - 5.11 MIL/uL   Hemoglobin 9.2 (L) 12.0 - 15.0 g/dL   HCT 31.3 (L) 36.0 - 46.0 %   MCV 84.1 80.0 - 100.0 fL   MCH 24.7 (L) 26.0 - 34.0 pg   MCHC 29.4 (L) 30.0 - 36.0 g/dL   RDW 17.3 (H) 11.5 - 15.5 %   Platelets 267 150 - 400 K/uL   nRBC 0.0 0.0 - 0.2 %    Comment: Performed at [facility], [place] [address] [place], [place] [address]  Comprehensive metabolic panel     Status: Abnormal    Collection Time: 12/03/18  7:55 PM  Result Value Ref Range   Sodium 139 135 - 145 mmol/L   Potassium 3.7 3.5 - 5.1 mmol/L   Chloride 107 98 - 111 mmol/L   CO2 23 22 - 32 mmol/L   Glucose, Bld 147 (H) 70 - 99 mg/dL   BUN 48 (H) 8 - 23 mg/dL   Creatinine, Ser 2.54 (H) 0.44 - 1.00 mg/dL   Calcium 9.0 8.9 - 10.3 mg/dL   Total Protein 7.6 6.5 - 8.1 g/dL   Albumin 3.1 (L) 3.5 - 5.0 g/dL   AST 13 (L) 15 - 41 U/L   ALT 10 0 - 44 U/L   Alkaline Phosphatase 109 38 - 126 U/L   Total Bilirubin 1.1 0.3 - 1.2 mg/dL   GFR calc non Af Amer 18 (L) >60 mL/min   GFR calc Af Amer 21 (L) >60 mL/min   Anion gap 9 5 - 15    Comment: Performed at [facility], [place] [address], [place], [place] [address]   Ue Venous Duplex (mc & Wl 7 Am - 7 Pm)  Result Date: 12/03/2018 UPPER VENOUS STUDY  Indications: Pain, and Swelling Limitations: Body habitus, line and PICC on site. Comparison Study: No prior study available Performing Technologist: Lorina Rabon  Examination Guidelines: A complete evaluation includes B-mode imaging, spectral Doppler, color Doppler, and power Doppler as needed of all accessible portions of each vessel. Bilateral testing is considered an integral part of a complete examination. Limited examinations for reoccurring indications may be performed as noted.  Right Findings: +----------+------------+----------+---------+-----------+---------------------+ RIGHT     CompressiblePropertiesPhasicitySpontaneous       Summary        +----------+------------+----------+---------+-----------+---------------------+ IJV           Full                 Yes       Yes                          +----------+------------+----------+---------+-----------+---------------------+ Subclavian    Full                 Yes       Yes        PICC on site      +----------+------------+----------+---------+-----------+---------------------+  Axillary      Full                            Yes        PICC on site      +----------+------------+----------+---------+-----------+---------------------+ Brachial      Full                           Yes     Limited visibility                                                         due to IV on site   +----------+------------+----------+---------+-----------+---------------------+ Radial        Full                                                        +----------+------------+----------+---------+-----------+---------------------+ Ulnar         Full                                                        +----------+------------+----------+---------+-----------+---------------------+ Cephalic      Full                                                        +----------+------------+----------+---------+-----------+---------------------+ Basilic       Full                           Yes                          +----------+------------+----------+---------+-----------+---------------------+ Study limited due to PICC on site.  Left Findings: +----------+------------+----------+---------+-----------+-------+ LEFT      CompressiblePropertiesPhasicitySpontaneousSummary +----------+------------+----------+---------+-----------+-------+ Subclavian    Full                 Yes       Yes            +----------+------------+----------+---------+-----------+-------+  Summary:  Right: No evidence of deep vein thrombosis in the upper extremity. No evidence of superficial vein thrombosis in the upper extremity. No evidence of thrombosis in the subclavian. Study limited due to PICC on site.  Left: No evidence of thrombosis in the subclavian.  *See table(s) above for measurements and observations.    Preliminary     Pending Labs Unresulted Labs (From admission, onward)    Start     Ordered   12/04/18 1856  Basic metabolic panel  Tomorrow morning,   R     12/03/18 2300   12/04/18 0500  CBC  Tomorrow morning,   R      12/03/18 2300          Vitals/Pain Today's Vitals  12/03/18 1658 12/03/18 1940 12/03/18 2130  BP: (!) 200/105 (!) 175/99 (!) 176/96  Pulse: 71 73 76  Resp: 18 18 18   Temp: 97.8 F (36.6 C)    TempSrc: Oral    SpO2: 97% 97% 96%    Isolation Precautions No active isolations  Medications Medications  heparin injection 5,000 Units (has no administration in time range)  acetaminophen (TYLENOL) tablet 650 mg (has no administration in time range)    Or  acetaminophen (TYLENOL) suppository 650 mg (has no administration in time range)  oxyCODONE (Oxy IR/ROXICODONE) immediate release tablet 5 mg (has no administration in time range)  0.9 %  sodium chloride infusion (has no administration in time range)  ondansetron (ZOFRAN) tablet 4 mg (has no administration in time range)    Or  ondansetron (ZOFRAN) injection 4 mg (has no administration in time range)  amLODipine (NORVASC) tablet 10 mg (has no administration in time range)  atorvastatin (LIPITOR) tablet 20 mg (has no administration in time range)  hydrALAZINE (APRESOLINE) tablet 100 mg (has no administration in time range)  Nebivolol HCl TABS 20 mg (has no administration in time range)  sodium chloride 0.9 % bolus 1,000 mL (1,000 mLs Intravenous New Bag/Given (Non-Interop) 12/03/18 2059)    Mobility walks

## 2018-12-03 NOTE — Telephone Encounter (Signed)
BJ from [facility] states that she is rtn a call from [place] regarding the pt

## 2018-12-03 NOTE — H&P (Signed)
History and Physical    Beth [name] QDI:[id number] DOB: Sep 15, 1946 DOA: 12/03/2018  PCP: Claretta Fraise, MD  Patient coming from: Home  I have personally briefly reviewed patient's old medical records in [place]  Chief Complaint: Right upper extremity swelling and pain  HPI: Beth [name] is a 72 y.o. female with medical history significant for hypertension, type 2 diabetes, hyperlipidemia, and recent infection of right total knee arthroplasty s/p resection of arthroplasty and placement of antibiotic spacer on 11/03/2018 requiring long-term IV antibiotics via RUE PICC line who presents to the ED with about 3 days of swelling and pain of her right upper extremity.  After her surgery she was discharged to [facility] rehab facility in which time she is receiving continued IV antibiotics via her PICC line.  She was discharged from her rehab facility on 11/29/2018 to home.  She says that she did not receive any home health nursing, line care, or antibiotics at home.  About 3 days ago she started to notice the swelling at her right upper extremity associated with pain.  She did not see any skin changes.  She has kept the leg brace on at her right leg but has not noticed any significant changes at her right knee.  She denies any fevers, chest pain, or shortness of breath.    ED Course:  Initial vitals showed BP 200/105, pulse 71, RR 18, temp 97.8 Fahrenheit, SPO2 97% on room air. Labs are notable for BUN 48, creatinine 2.54 (0.86 on 11/05/2018).  Patient was given 1 L normal saline.  RUE Doppler ultrasound was negative for DVT.  The case was discussed with on-call ID who recommended PICC line removal and can use oral ciprofloxacin to treat until IV access reestablished.  The hospitalist service was consulted to admit for acute kidney injury.  Review of Systems: As per HPI otherwise 10 point review of systems negative.    Past Medical History:  Diagnosis Date  . Anemia   . Arthritis     Knee both knees  . Blood transfusion without reported diagnosis 2012   anemia;pt denies transfusion stated was only on iron tablet  . Cataract    left  . CKD (chronic kidney disease), stage III ([place])   . CVA (cerebral infarction)    2006  . Diabetes mellitus without complication ([place])   . Family history of anesthesia complication    sister very slow to awaken after anesthesia;severe vomiting   . Gout    left elbow  . Heart murmur   . 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 08-09-14 Right eye  . Hyperlipidemia   . Hypertension   . Nocturia    3-4 times per night  . Stroke [facility]) 2006   x 1 no deficits noted     Past Surgical History:  Procedure Laterality Date  . ABDOMINAL HYSTERECTOMY  1983  . CARPAL TUNNEL RELEASE Right 1983  . colonscopy  June 21, 2014  . EXCISIONAL TOTAL KNEE ARTHROPLASTY WITH ANTIBIOTIC SPACERS Right 02/16/2015   Procedure: RIGHT KNEE RESECTION ARTHROPLASTY WITH ANTIBIOTIC SPACERS;  Surgeon: [name], MD;  Location: [facility];  Service: Orthopedics;  Laterality: Right;  . EXCISIONAL TOTAL KNEE ARTHROPLASTY WITH ANTIBIOTIC SPACERS Right 11/03/2018   Procedure: Right knee resection arthroplasty; antibiotic spacer;  Surgeon: [name], MD;  Location: [facility];  Service: Orthopedics;  Laterality: Right;  Adductor Block  . I&D KNEE WITH POLY EXCHANGE Right 10/02/2014   Procedure: IRRIGATION AND DEBRIDEMENT RIGHT  KNEE WITH POLY EXCHANGE;  Surgeon: Gearlean Alf, MD;  Location: [facility];  Service: Orthopedics;  Laterality: Right;  . INCISION AND DRAINAGE OF WOUND Right 01/14/2017   Procedure: IRRIGATION AND DEBRIDEMENT WOUND;  Surgeon: [name], MD;  Location: [facility];  Service: Orthopedics;  Laterality: Right;  requests 39mns  . JOINT REPLACEMENT  06/2014   right knee  . nasal cauterization  2012  . PATELLAR TENDON REPAIR Right 08/11/2014   Procedure: RIGHT PATELLA TENDON REPAIR;  Surgeon: FGearlean Alf MD;  Location: [facility];  Service: Orthopedics;   Laterality: Right;  . REIMPLANTATION OF TOTAL KNEE Right 05/23/2015   Procedure: RIGHT KNEE ARTHROPLASTY REIMPLANTATION;  Surgeon: FGaynelle Arabian MD;  Location: [facility];  Service: Orthopedics;  Laterality: Right;  . TOTAL KNEE ARTHROPLASTY Right 07/03/2014   Procedure: RIGHT TOTAL KNEE ARTHROPLASTY;  Surgeon: FGearlean Alf MD;  Location: [facility];  Service: Orthopedics;  Laterality: Right;  . TUBAL LIGATION       reports that she has never smoked. She has never used smokeless tobacco. She reports that she does not drink alcohol or use drugs.  Allergies  Allergen Reactions  . Aleve [Naproxen Sodium] Other (See Comments)    Heart races  . Asa [Aspirin] Other (See Comments)    Nose bleeding  . Clonidine Derivatives Other (See Comments)    Dizziness and weakness  . Shellfish Allergy Nausea And Vomiting    Family History  Problem Relation Age of Onset  . Ovarian cancer Mother   . Cancer Mother   . Diabetes Son   . Diabetes Son   . Peripheral vascular disease Father        with amputation of both legs  . Hypertension Father   . Heart disease Brother   . Kidney disease Daughter   . Colon cancer Neg Hx   . Esophageal cancer Neg Hx   . Stomach cancer Neg Hx   . Rectal cancer Neg Hx      Prior to Admission medications   Medication Sig Start Date End Date Taking? Authorizing Provider  acetaminophen (TYLENOL) 500 MG tablet Take 2 tablets (1,000 mg total) by mouth every 6 (six) hours as needed for mild pain. 09/28/18  Yes Stacks, WCletus Gash MD  amLODipine (NORVASC) 10 MG tablet TAKE 1 TABLET DAILY IN THE EVENING Patient taking differently: Take 10 mg by mouth every evening.  09/28/18  Yes SClaretta Fraise MD  atorvastatin (LIPITOR) 20 MG tablet Take 1 tablet (20 mg total) by mouth every evening. 09/28/18  Yes SClaretta Fraise MD  ferrous sulfate 325 (65 FE) MG tablet Take 325 mg by mouth 2 (two) times daily with a meal.   Yes [provider]  GAVILAX powder [place] Patient taking differently: Take 17 g by mouth daily as needed for moderate constipation.  09/03/18  Yes Stacks, WCletus Gash MD  hydrALAZINE (APRESOLINE) 100 MG tablet TAKE ONE (1) TABLET THREE (3) TIMES EACH DAY Patient taking differently: Take 100 mg by mouth 3 (three) times daily.  09/28/18  Yes Stacks, WCletus Gash MD  metFORMIN (GLUCOPHAGE-XR) 500 MG 24 hr tablet TAKE ONE (1) TABLET EACH DAY Patient taking differently: Take 500 mg by mouth daily with breakfast.  09/28/18  Yes Stacks, WCletus Gash MD  Nebivolol HCl (BYSTOLIC) 20 MG TABS TAKE ONE (1) TABLET EACH DAY Patient taking differently: Take 20 mg by mouth daily.  09/28/18  Yes SClaretta Fraise MD  olmesartan (BENICAR) 40 MG  tablet TAKE ONE (1) TABLET EACH DAY Patient taking differently: Take 40 mg by mouth daily.  09/28/18  Yes Claretta Fraise, MD  oxyCODONE (OXY IR/ROXICODONE) 5 MG immediate release tablet Take 1 tablet (5 mg total) by mouth every 6 (six) hours as needed for moderate pain (pain score 4-6). 11/05/18  Yes Edmisten, Kristie L, PA  ceFEPime (MAXIPIME) IVPB Inject 2 g into the vein every 8 (eight) hours. Indication: Pseudomonas Prosthetic Joint Infection Last Day of Therapy:  12/18/18 Labs - Once weekly:  CBC/D and BMP, Labs - Every other week:  ESR and CRP Patient not taking: Reported on 12/03/2018 11/07/18 12/19/18  Ardeen Jourdain, PA-C  ceFEPIme 2 g in sodium chloride 0.9 % 100 mL Inject 2 g into the vein every 8 (eight) hours. Patient not taking: Reported on 12/03/2018 11/07/18   Ardeen Jourdain, PA-C  labetalol (NORMODYNE) 200 MG tablet Take 1 tablet (200 mg total) by mouth 2 (two) times daily. Patient not taking: Reported on 10/20/2018 09/28/18   Claretta Fraise, MD  methocarbamol (ROBAXIN) 500 MG tablet Take 1 tablet (500 mg total) by mouth every 6 (six) hours as needed for muscle spasms. Patient not taking: Reported on 12/03/2018 11/05/18   Edmisten, Ok Anis, PA  ONE TOUCH ULTRA TEST test strip Use to check  glucose up to four times daily Patient not taking: Reported on 10/20/2018 12/09/17   Claretta Fraise, MD  [place] LANCETS 03K [place] USE TO [place] GLUCOSE UP TO  4 TIMES DAILY Patient not taking: Reported on 10/20/2018 12/09/17   Claretta Fraise, MD  predniSONE (STERAPRED UNI-PAK 21 TAB) 5 MG (21) TBPK tablet Day 1 x 6, Day 2 x 5, Day 3 x 4, Day 4 x 3, Day 5 x 2, Day 6 x 1 Patient not taking: Reported on 12/03/2018 11/11/18   Edmisten, Ok Anis, PA  rivaroxaban (XARELTO) 10 MG TABS tablet Take 1 tablet (10 mg total) by mouth daily with breakfast. Take one tablet xarelto 10 mg once a day for three weeks following surgery. Then take one 81 mg aspirin once a day for three weeks. Then discontinue aspirin. Patient not taking: Reported on 12/03/2018 11/06/18   Edmisten, Ok Anis, PA  traMADol (ULTRAM) 50 MG tablet Take 1-2 tablets (50-100 mg total) by mouth every 6 (six) hours as needed for moderate pain (not responding to oxycodone). Patient not taking: Reported on 12/03/2018 11/05/18   Derl Barrow, [place]    Physical Exam: Vitals:   12/03/18 2320 12/03/18 2337 12/03/18 2351 12/04/18 0049  BP: (!) 201/108 (!) 201/108  (!) 175/85  Pulse: 79 79  79  Resp: 18 18  16   Temp: 98.2 F (36.8 C) 98.2 F (36.8 C)  98.2 F (36.8 C)  TempSrc: Oral Oral  Oral  SpO2: 100% 100%  97%  Weight: 98 kg  98 kg   Height: 5' 5"  (1.651 m)  5' 5"  (1.651 m)     Constitutional: Elderly woman resting in bed, NAD, calm Eyes: PERRL, lids and conjunctivae normal ENMT: Mucous membranes are moist. Posterior pharynx clear of any exudate or lesions.Normal dentition.  Neck: normal, supple, no masses. Respiratory: clear to auscultation bilaterally, no wheezing, no crackles. Normal respiratory effort. No accessory muscle use.  Cardiovascular: Regular rate and rhythm, 2/6 systolic murmur. 1+ pitting edema bilateral lower extremities.  Slight swelling of RUE. 2+ radial pulses. Abdomen: no tenderness, no masses palpated.  Bowel sounds positive.  Musculoskeletal: Surgical scar right knee healing well without erythema, open wound, or  discharge Skin: Surgical scar right knee, healing well without erythema, open wound, or discharge, no erythema of right upper extremity Neurologic: CN 2-12 grossly intact. Sensation intact. Strength 5/5 in all 4.  Psychiatric: Normal judgment and insight. Alert and oriented x 3. Normal mood.    Labs on Admission: I have personally reviewed following labs and imaging studies  CBC: Recent Labs  Lab 12/03/18 1955  WBC 6.7  HGB 9.2*  HCT 31.3*  MCV 84.1  PLT 754   Basic Metabolic Panel: Recent Labs  Lab 12/03/18 1955  NA 139  K 3.7  CL 107  CO2 23  GLUCOSE 147*  BUN 48*  CREATININE 2.54*  CALCIUM 9.0   GFR: Estimated Creatinine Clearance: 22.5 mL/min (A) (by C-G formula based on SCr of 2.54 mg/dL (H)). Liver Function Tests: Recent Labs  Lab 12/03/18 1955  AST 13*  ALT 10  ALKPHOS 109  BILITOT 1.1  PROT 7.6  ALBUMIN 3.1*   No results for input(s): LIPASE, AMYLASE in the last 168 hours. No results for input(s): AMMONIA in the last 168 hours. Coagulation Profile: No results for input(s): INR, PROTIME in the last 168 hours. Cardiac Enzymes: No results for input(s): CKTOTAL, CKMB, CKMBINDEX, TROPONINI in the last 168 hours. BNP (last 3 results) No results for input(s): PROBNP in the last 8760 hours. HbA1C: No results for input(s): HGBA1C in the last 72 hours. CBG: Recent Labs  Lab 12/04/18 0051  GLUCAP 178*   Lipid Profile: No results for input(s): CHOL, HDL, LDLCALC, TRIG, CHOLHDL, LDLDIRECT in the last 72 hours. Thyroid Function Tests: No results for input(s): TSH, T4TOTAL, FREET4, T3FREE, THYROIDAB in the last 72 hours. Anemia Panel: No results for input(s): VITAMINB12, FOLATE, FERRITIN, TIBC, IRON, RETICCTPCT in the last 72 hours. Urine analysis:    Component Value Date/Time   COLORURINE YELLOW 02/11/2017 2258   APPEARANCEUR Clear 09/28/2018  1628   LABSPEC 1.010 02/11/2017 2258   PHURINE 6.0 02/11/2017 2258   GLUCOSEU Negative 09/28/2018 1628   HGBUR LARGE (A) 02/11/2017 2258   BILIRUBINUR Negative 09/28/2018 1628   KETONESUR NEGATIVE 02/11/2017 2258   PROTEINUR 2+ (A) 09/28/2018 1628   PROTEINUR 100 (A) 02/11/2017 2258   UROBILINOGEN 1.0 04/30/2015 1400   NITRITE Negative 09/28/2018 1628   NITRITE NEGATIVE 02/11/2017 2258   LEUKOCYTESUR Negative 09/28/2018 1628    Radiological Exams on Admission: Ue Venous Duplex (mc & Wl 7 Am - 7 Pm)  Result Date: 12/03/2018 UPPER VENOUS STUDY  Indications: Pain, and Swelling Limitations: Body habitus, line and PICC on site. Comparison Study: No prior study available Performing Technologist: Lorina Rabon  Examination Guidelines: A complete evaluation includes B-mode imaging, spectral Doppler, color Doppler, and power Doppler as needed of all accessible portions of each vessel. Bilateral testing is considered an integral part of a complete examination. Limited examinations for reoccurring indications may be performed as noted.  Right Findings: +----------+------------+----------+---------+-----------+---------------------+ RIGHT     CompressiblePropertiesPhasicitySpontaneous       Summary        +----------+------------+----------+---------+-----------+---------------------+ IJV           Full                 Yes       Yes                          +----------+------------+----------+---------+-----------+---------------------+ Subclavian    Full  Yes       Yes        PICC on site      +----------+------------+----------+---------+-----------+---------------------+ Axillary      Full                           Yes        PICC on site      +----------+------------+----------+---------+-----------+---------------------+ Brachial      Full                           Yes     Limited visibility                                                          due to IV on site   +----------+------------+----------+---------+-----------+---------------------+ Radial        Full                                                        +----------+------------+----------+---------+-----------+---------------------+ Ulnar         Full                                                        +----------+------------+----------+---------+-----------+---------------------+ Cephalic      Full                                                        +----------+------------+----------+---------+-----------+---------------------+ Basilic       Full                           Yes                          +----------+------------+----------+---------+-----------+---------------------+ Study limited due to PICC on site.  Left Findings: +----------+------------+----------+---------+-----------+-------+ LEFT      CompressiblePropertiesPhasicitySpontaneousSummary +----------+------------+----------+---------+-----------+-------+ Subclavian    Full                 Yes       Yes            +----------+------------+----------+---------+-----------+-------+  Summary:  Right: No evidence of deep vein thrombosis in the upper extremity. No evidence of superficial vein thrombosis in the upper extremity. No evidence of thrombosis in the subclavian. Study limited due to PICC on site.  Left: No evidence of thrombosis in the subclavian.  *See table(s) above for measurements and observations.    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 female with medical history significant for hypertension, type 2 diabetes, hyperlipidemia, and recent infection of right total knee arthroplasty s/p resection of  arthroplasty and placement of antibiotic spacer on 11/03/2018 requiring long-term IV antibiotics via RUE PICC line who presents to the ED with about 3 days of swelling and pain of her right upper extremity found to have acute kidney injury.  Acute kidney injury: Unspecified cause, potentially prerenal versus medication (olmesartan, metformin).  No urinalysis obtained on arrival.  She is status post 1 L normal saline in the ED. -Obtain UA, urine sodium and creatinine -IV fluids overnight -Nephrotoxic agents -Recheck in a.m.  PICC line associated RUE swelling and pain: RUE Doppler ultrasound negative for acute DVT.  PICC line was pulled in the ED. -Continue to monitor  Infection of right TKA s/p arthroplasty resection and spacer placement: Has been on IV cefepime via RUE PICC line which was pulled in the ED as above.  Peripheral IV placed in ED.  Had planned 6-week antibiotic course with end date 12/17/2018. -Continue IV cefepime while inpatient -Will need to clarify with ID on replacement of PICC line for completion of IV antibiotics versus transition to oral Cipro  Type 2 diabetes: On metformin at home. -SSI while inpatient  Hypertension: Blood pressures elevated on admission. -Restart home amlodipine, hydralazine, nebivolol -IV hydralazine as needed  Hyperlipidemia: -Continue atorvastatin  DVT prophylaxis: subq heparin Code Status: Full code Family Communication: Discussed with patient and 2 daughters at bedside Disposition Plan: Pending further management of AKI Consults called: None Admission status: Observation   Zada Finders MD [facility] Pager [phone]  If 7PM-7AM, please contact night-coverage [email or web address] Password [email or web address]  12/04/2018, 1:30 AM

## 2018-12-03 NOTE — ED Provider Notes (Signed)
[name] DEPT Provider Note   CSN: [id number] Arrival date & time: 12/03/18  1654     History   Chief Complaint Chief Complaint  Patient presents with  . Arm Swelling    right    HPI [name] is a 72 y.o. female.  HPI  72 yo female s/p right knee infection, with infected right total knee arthroplasty discharged from [facility]hab at [name] on Monday December 23.  Patient with Picc line right arm but states has not received any visits or medicine since.  Patient complaining of . [name] has come to give physical therapy.  Family states she was supposed to get six weeks of abx. No fever, may have had some chills. Pain and swelling right arm Knee improving  From communication in chart: Patient's son called stating his mother [name] was discharged from [facility] rehab facility Monday and now her right arm where her PICC line is placed in red, hot and swollen X2 days. They state PICC line has not been flushed or no antibiotics since she left  [facility] 11/29/2018.  Advised them to take [name] to the Emergency room ASAP to be evaluated  for blood clots and or infection.  They state they will go to [facility] ED for evaluation.  Also advised them Dr. On call paged to see what to do with antibiotics for the future.  They state [name] has a hospital follow up appointment with Dr. Tommy Medal 12/15/2018.    Selz spokw to [place] to see what happened they states Education officer, museum at [employer] up home health services for medication and PICC care before her discharge. Made them aware patient is on her way to the emergency room  For swollen right arm at PICC site. They state they spoke to the coordinator Tim.   Called [name] at home and they state they never received nursing or medication orders for PICC line. Spoke to [employer] copy.  Made them aware patient is on her way to the emergency room  For swollen right arm at PICC  site. Pricilla Riffle RN    Past Medical History:  Diagnosis Date  . Anemia   . Arthritis    Knee both knees  . Blood transfusion without reported diagnosis 2012   anemia;pt denies transfusion stated was only on iron tablet  . Cataract    left  . CKD (chronic kidney disease), stage III ([place])   . CVA (cerebral infarction)    2006  . Diabetes mellitus without complication ([place])   . Family history of anesthesia complication    sister very slow to awaken after anesthesia;severe vomiting   . Gout    left elbow  . Heart murmur   . 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 08-09-14 Right eye  . Hyperlipidemia   . Hypertension   . Nocturia    3-4 times per night  . Stroke [facility]) 2006   x 1 no deficits noted     Patient Active Problem List   Diagnosis Date Noted  . Septic arthritis of knee, right ([place]) 11/03/2018  . Steroid-induced hyperglycemia   . 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 Cellulitis and abscess of leg 01/14/2017  . Abscess of right leg 01/14/2017  . Diabetes type 2, controlled ([place]) 03/07/2016  . Gout   . Essential hypertension   . Enuresis 11/19/2015  . HLD (hyperlipidemia) 01/04/2014  . Anemia, iron  deficiency 12/30/2011  . Constipation 04/04/2009  . TUBULOVILLOUS ADENOMA, COLON 04/03/2009    Past Surgical History:  Procedure Laterality Date  . ABDOMINAL HYSTERECTOMY  1983  . CARPAL TUNNEL RELEASE Right 1983  . colonscopy  June 21, 2014  . EXCISIONAL TOTAL KNEE ARTHROPLASTY WITH ANTIBIOTIC SPACERS Right 02/16/2015   Procedure: RIGHT KNEE RESECTION ARTHROPLASTY WITH ANTIBIOTIC SPACERS;  Surgeon: [name], MD;  Location: [facility];  Service: Orthopedics;  Laterality: Right;  . EXCISIONAL TOTAL KNEE ARTHROPLASTY WITH ANTIBIOTIC SPACERS Right 11/03/2018   Procedure: Right knee resection arthroplasty; antibiotic spacer;  Surgeon: [name], MD;  Location: [facility];  Service: Orthopedics;  Laterality: Right;  Adductor Block  . I&D  KNEE WITH POLY EXCHANGE Right 10/02/2014   Procedure: IRRIGATION AND DEBRIDEMENT RIGHT KNEE WITH POLY EXCHANGE;  Surgeon: Gearlean Alf, MD;  Location: [facility];  Service: Orthopedics;  Laterality: Right;  . INCISION AND DRAINAGE OF WOUND Right 01/14/2017   Procedure: IRRIGATION AND DEBRIDEMENT WOUND;  Surgeon: [name], MD;  Location: [facility];  Service: Orthopedics;  Laterality: Right;  requests 31mns  . JOINT REPLACEMENT  06/2014   right knee  . nasal cauterization  2012  . PATELLAR TENDON REPAIR Right 08/11/2014   Procedure: RIGHT PATELLA TENDON REPAIR;  Surgeon: FGearlean Alf MD;  Location: [facility];  Service: Orthopedics;  Laterality: Right;  . REIMPLANTATION OF TOTAL KNEE Right 05/23/2015   Procedure: RIGHT KNEE ARTHROPLASTY REIMPLANTATION;  Surgeon: FGaynelle Arabian MD;  Location: [facility];  Service: Orthopedics;  Laterality: Right;  . TOTAL KNEE ARTHROPLASTY Right 07/03/2014   Procedure: RIGHT TOTAL KNEE ARTHROPLASTY;  Surgeon: FGearlean Alf MD;  Location: [facility];  Service: Orthopedics;  Laterality: Right;  . TUBAL LIGATION       OB History   No obstetric history on file.      Home Medications    Prior to Admission medications   Medication Sig Start Date End Date Taking? Authorizing Provider  acetaminophen (TYLENOL) 500 MG tablet Take 2 tablets (1,000 mg total) by mouth every 6 (six) hours as needed for mild pain. 09/28/18   SClaretta Fraise MD  amLODipine (NORVASC) 10 MG tablet TAKE 1 TABLET DAILY IN THE EVENING Patient taking differently: Take 10 mg by mouth every evening.  09/28/18   SClaretta Fraise MD  atorvastatin (LIPITOR) 20 MG tablet Take 1 tablet (20 mg total) by mouth every evening. 09/28/18   SClaretta Fraise MD  Bisacodyl (DULCOLAX PO) Take by mouth.    [provider]  ceFEPime (MAXIPIME) IVPB Inject 2 g into the vein every 8 (eight) hours. Indication: Pseudomonas Prosthetic Joint Infection Last Day of Therapy:  12/18/18 Labs - Once weekly:  CBC/D and  BMP, Labs - Every other week:  ESR and CRP 11/07/18 12/19/18  Constable, [employer] PA-C  ceFEPIme 2 g in sodium chloride 0.9 % 100 mL Inject 2 g into the vein every 8 (eight) hours. 11/07/18   Constable, Amber, PA-C  ferrous sulfate 325 (65 FE) MG tablet Take 325 mg by mouth 2 (two) times daily with a meal.    [provider]  GAVILAX powder MJudith GapOF WATER AND [place]ovan EstatesDAILY Patient taking differently: Take 17 g by mouth daily as needed for moderate constipation.  09/03/18   SClaretta Fraise MD  hydrALAZINE (APRESOLINE) 100 MG tablet TAKE ONE (1) TABLET THREE (3) TIMES EACH DAY Patient taking differently: Take 100 mg by mouth 3 (three) times daily.  09/28/18   SClaretta Fraise  MD  labetalol (NORMODYNE) 200 MG tablet Take 1 tablet (200 mg total) by mouth 2 (two) times daily. Patient not taking: Reported on 10/20/2018 09/28/18   Claretta Fraise, MD  metFORMIN (GLUCOPHAGE-XR) 500 MG 24 hr tablet TAKE ONE (1) TABLET EACH DAY Patient taking differently: Take 500 mg by mouth daily with breakfast.  09/28/18   Claretta Fraise, MD  methocarbamol (ROBAXIN) 500 MG tablet Take 1 tablet (500 mg total) by mouth every 6 (six) hours as needed for muscle spasms. 11/05/18   Edmisten, Kristie L, PA  Nebivolol HCl (BYSTOLIC) 20 MG TABS TAKE ONE (1) TABLET EACH DAY Patient taking differently: Take 20 mg by mouth daily.  09/28/18   Claretta Fraise, MD  olmesartan (BENICAR) 40 MG tablet TAKE ONE (1) TABLET EACH DAY Patient taking differently: Take 40 mg by mouth daily.  09/28/18   Claretta Fraise, MD  ONE TOUCH ULTRA TEST test strip Use to check glucose up to four times daily Patient not taking: Reported on 10/20/2018 12/09/17   Claretta Fraise, MD  [facility] DELICA LANCETS 76K MISC USE TO CHECK GLUCOSE UP TO  4 TIMES DAILY Patient not taking: Reported on 10/20/2018 12/09/17   Claretta Fraise, MD  oxyCODONE (OXY IR/ROXICODONE) 5 MG immediate release tablet Take 1 tablet (5 mg total) by mouth every 6 (six) hours as  needed for moderate pain (pain score 4-6). 11/05/18   Edmisten, Kristie L, PA  predniSONE (STERAPRED UNI-PAK 21 TAB) 5 MG (21) TBPK tablet Day 1 x 6, Day 2 x 5, Day 3 x 4, Day 4 x 3, Day 5 x 2, Day 6 x 1 11/11/18   Edmisten, Kristie L, PA  rivaroxaban (XARELTO) 10 MG TABS tablet Take 1 tablet (10 mg total) by mouth daily with breakfast. Take one tablet xarelto 10 mg once a day for three weeks following surgery. Then take one 81 mg aspirin once a day for three weeks. Then discontinue aspirin. 11/06/18   Edmisten, Ok Anis, PA  traMADol (ULTRAM) 50 MG tablet Take 1-2 tablets (50-100 mg total) by mouth every 6 (six) hours as needed for moderate pain (not responding to oxycodone). 11/05/18   Edmisten, Ok Anis, PA    Family History Family History  Problem Relation Age of Onset  . Ovarian cancer Mother   . Cancer Mother   . Diabetes Son   . Diabetes Son   . Peripheral vascular disease Father        with amputation of both legs  . Hypertension Father   . Heart disease Brother   . Kidney disease Daughter   . Colon cancer Neg Hx   . Esophageal cancer Neg Hx   . Stomach cancer Neg Hx   . Rectal cancer Neg Hx     Social History Social History   Tobacco Use  . Smoking status: Never Smoker  . Smokeless tobacco: Never Used  Substance Use Topics  . Alcohol use: No  . Drug use: No     Allergies   Aleve [naproxen sodium]; Asa [aspirin]; Clonidine derivatives; and Shellfish allergy   Review of Systems Review of Systems  All other systems reviewed and are negative.    Physical Exam Updated Vital Signs BP (!) 200/105 (BP Location: Left Arm)   Pulse 71   Temp 97.8 F (36.6 C) (Oral)   Resp 18   SpO2 97%   Physical Exam Vitals signs and nursing note reviewed.  Constitutional:      Appearance: Normal appearance.  HENT:  Head: Normocephalic.     Right Ear: External ear normal.     Left Ear: External ear normal.     Nose: Nose normal.     Mouth/Throat:     Mouth: Mucous  membranes are dry.  Eyes:     Pupils: Pupils are equal, round, and reactive to light.  Neck:     Musculoskeletal: Normal range of motion.  Cardiovascular:     Rate and Rhythm: Normal rate.  Pulmonary:     Effort: Pulmonary effort is normal.  Abdominal:     General: Abdomen is flat.  Musculoskeletal:        General: Tenderness present.     Comments: picc rue No redness or swelling   Skin:    General: Skin is warm and dry.     Capillary Refill: Capillary refill takes less than 2 seconds.  Neurological:     General: No focal deficit present.     Mental Status: She is alert.  Psychiatric:        Mood and Affect: Mood normal.      ED Treatments / Results  Labs (all labs ordered are listed, but only abnormal results are displayed) Labs Reviewed - No data to display  EKG None  Radiology No results found.  Procedures Procedures (including critical care time)  Medications Ordered in ED Medications - No data to display   Initial Impression / Assessment and Plan / ED Course  I have reviewed the triage vital signs and the nursing notes.  Pertinent labs & imaging results that were available during my care of the patient were reviewed by me and considered in my medical decision making (see chart for details).  Clinical Course as of Dec 03 2036  Fri Dec 03, 2018  2020 UE VENOUS DUPLEX [facility] 7 am - 7 pm) [DR]    Clinical Course User Index [DR] Pattricia Boss, MD   1-right knee infection- leg at baseline.  Patient discharged from [facility], but no home health care for IV antibiotics provided.  Had grown out Pseudomonas previously that is susceptible to quinolones 2- pain at picc line site- no evidence of dvt, discussed with Dr. Evelina Bucy and advised pull picc line. Can use cipro po to treat until iv access reestablished 3- increased creatinine- new from prior, plan iv fluid hydration, will need to trend  Discussed with Dr. Posey Pronto and will see for admission Final  Clinical Impressions(s) / ED Diagnoses   Final diagnoses:  Chronic infection of right knee ([place])  Elevated serum creatinine    ED Discharge Orders    None       Pattricia Boss, MD 12/03/18 2100

## 2018-12-03 NOTE — ED Triage Notes (Signed)
Pt c/o rigth arm swelling x 2 days. Was sent to rule out blood clot in arm. Pt reports pain but denies falls or injuries.

## 2018-12-03 NOTE — Progress Notes (Signed)
Pharmacy Antibiotic Note  [name] is a 72 y.o. female admitted on 12/03/2018 with infected right knee s/p TKA.  Pharmacy has been consulted for cefepime dosing for recent pseudomonal infection.  Plan: Cefepime 2 gm I V q24h F/u scr/cultures  Height: 5\' 5"  (165.1 cm) Weight: 216 lb (98 kg) IBW/kg (Calculated) : 57  Temp (24hrs), Avg:98.1 F (36.7 C), Min:97.8 F (36.6 C), Max:98.2 F (36.8 C)  Recent Labs  Lab 12/03/18 1955  WBC 6.7  CREATININE 2.54*    Estimated Creatinine Clearance: 22.5 mL/min (A) (by C-G formula based on SCr of 2.54 mg/dL (H)).    Allergies  Allergen Reactions  . Aleve [Naproxen Sodium] Other (See Comments)    Heart races  . Asa [Aspirin] Other (See Comments)    Nose bleeding  . Clonidine Derivatives Other (See Comments)    Dizziness and weakness  . Shellfish Allergy Nausea And Vomiting    Antimicrobials this admission: 12/27 cefepime >>    >>   Dose adjustments this admission:   Microbiology results:  BCx:   UCx:    Sputum:   MRSA PCR:   Thank you for allowing pharmacy to be a part of this patient's care.  Dorrene German 12/03/2018 11:56 PM

## 2018-12-04 ENCOUNTER — Observation Stay (HOSPITAL_COMMUNITY): Payer: Medicare Other

## 2018-12-04 ENCOUNTER — Other Ambulatory Visit: Payer: Self-pay

## 2018-12-04 DIAGNOSIS — N179 Acute kidney failure, unspecified: Secondary | ICD-10-CM | POA: Diagnosis not present

## 2018-12-04 LAB — GLUCOSE, CAPILLARY
Glucose-Capillary: 113 mg/dL — ABNORMAL HIGH (ref 70–99)
Glucose-Capillary: 115 mg/dL — ABNORMAL HIGH (ref 70–99)
Glucose-Capillary: 120 mg/dL — ABNORMAL HIGH (ref 70–99)
Glucose-Capillary: 156 mg/dL — ABNORMAL HIGH (ref 70–99)
Glucose-Capillary: 178 mg/dL — ABNORMAL HIGH (ref 70–99)

## 2018-12-04 LAB — URINALYSIS, ROUTINE W REFLEX MICROSCOPIC
BILIRUBIN URINE: NEGATIVE
Glucose, UA: NEGATIVE mg/dL
Ketones, ur: NEGATIVE mg/dL
Leukocytes, UA: NEGATIVE
NITRITE: NEGATIVE
Protein, ur: 30 mg/dL — AB
Specific Gravity, Urine: 1.008 (ref 1.005–1.030)
pH: 5 (ref 5.0–8.0)

## 2018-12-04 LAB — CBC
HCT: 29.6 % — ABNORMAL LOW (ref 36.0–46.0)
Hemoglobin: 8.7 g/dL — ABNORMAL LOW (ref 12.0–15.0)
MCH: 24.3 pg — ABNORMAL LOW (ref 26.0–34.0)
MCHC: 29.4 g/dL — ABNORMAL LOW (ref 30.0–36.0)
MCV: 82.7 fL (ref 80.0–100.0)
Platelets: 253 10*3/uL (ref 150–400)
RBC: 3.58 MIL/uL — ABNORMAL LOW (ref 3.87–5.11)
RDW: 17.2 % — ABNORMAL HIGH (ref 11.5–15.5)
WBC: 5.5 10*3/uL (ref 4.0–10.5)
nRBC: 0 % (ref 0.0–0.2)

## 2018-12-04 LAB — BASIC METABOLIC PANEL
Anion gap: 11 (ref 5–15)
BUN: 43 mg/dL — ABNORMAL HIGH (ref 8–23)
CO2: 19 mmol/L — ABNORMAL LOW (ref 22–32)
Calcium: 8.5 mg/dL — ABNORMAL LOW (ref 8.9–10.3)
Chloride: 108 mmol/L (ref 98–111)
Creatinine, Ser: 2.41 mg/dL — ABNORMAL HIGH (ref 0.44–1.00)
GFR calc Af Amer: 22 mL/min — ABNORMAL LOW (ref >60)
GFR calc non Af Amer: 19 mL/min — ABNORMAL LOW (ref >60)
Glucose, Bld: 135 mg/dL — ABNORMAL HIGH (ref 70–99)
Potassium: 3.2 mmol/L — ABNORMAL LOW (ref 3.5–5.1)
Sodium: 138 mmol/L (ref 135–145)

## 2018-12-04 LAB — SODIUM, URINE, RANDOM: Sodium, Ur: 100 mmol/L

## 2018-12-04 LAB — CREATININE, URINE, RANDOM: Creatinine, Urine: 33.38 mg/dL

## 2018-12-04 MED ORDER — HYDRALAZINE HCL 50 MG PO TABS
100.0000 mg | ORAL_TABLET | Freq: Four times a day (QID) | ORAL | Status: DC
  Administered 2018-12-04 – 2018-12-09 (×20): 100 mg via ORAL
  Filled 2018-12-04 (×20): qty 2

## 2018-12-04 MED ORDER — HYDRALAZINE HCL 20 MG/ML IJ SOLN
10.0000 mg | Freq: Four times a day (QID) | INTRAMUSCULAR | Status: DC | PRN
  Administered 2018-12-06 – 2018-12-08 (×2): 10 mg via INTRAVENOUS
  Filled 2018-12-04 (×3): qty 1

## 2018-12-04 MED ORDER — LIP MEDEX EX OINT
TOPICAL_OINTMENT | CUTANEOUS | Status: DC | PRN
  Administered 2018-12-04: 07:00:00 via TOPICAL

## 2018-12-04 MED ORDER — SODIUM CHLORIDE 0.9 % IV SOLN
INTRAVENOUS | Status: DC
  Administered 2018-12-04 – 2018-12-09 (×11): via INTRAVENOUS

## 2018-12-04 MED ORDER — SODIUM CHLORIDE 0.9 % IV SOLN
2.0000 g | Freq: Every day | INTRAVENOUS | Status: DC
  Administered 2018-12-05 – 2018-12-08 (×4): 2 g via INTRAVENOUS
  Filled 2018-12-04 (×5): qty 2

## 2018-12-04 MED ORDER — LIP MEDEX EX OINT
TOPICAL_OINTMENT | CUTANEOUS | Status: AC
  Filled 2018-12-04: qty 7

## 2018-12-04 MED ORDER — POTASSIUM CHLORIDE CRYS ER 20 MEQ PO TBCR
40.0000 meq | EXTENDED_RELEASE_TABLET | Freq: Once | ORAL | Status: AC
  Administered 2018-12-04: 40 meq via ORAL
  Filled 2018-12-04: qty 2

## 2018-12-04 NOTE — Evaluation (Signed)
Occupational Therapy Evaluation Patient Details Name: [name] MRN: [id number] DOB: 24-May-1946 Today's Date: 12/04/2018    History of Present Illness 72 year old female admitted for acute kidney injury.  R knee spacer placed on 11/03/18.  has RUE swelling, h/o DM, HTN, CVA and multiple knee sxs   Clinical Impression   Pt was admitted for the above. She was admitted from home but was at [facility] prior to this.  Pt reports she had assistance for adls and performed SPTs at home with assistance. Will follow in acute setting with min A level goals.  Pt currently needs mod +2 assist to stand. Her dominant hand is swollen and stiff; educated on edema management    Follow Up Recommendations  SNF;Supervision/Assistance - 24 hour    Equipment Recommendations  None recommended by OT    Recommendations for Other Services       Precautions / Restrictions Precautions Precautions: Fall Precaution Comments: R hand swollen/painful Required Braces or Orthoses: Knee Immobilizer - Right Knee Immobilizer - Right: On at all times Restrictions Weight Bearing Restrictions: No Other Position/Activity Restrictions: WBAT      Mobility Bed Mobility               General bed mobility comments: performed by PT  Transfers   Equipment used: Rolling walker (2 wheeled)   Sit to Stand: Mod assist;+2 physical assistance;From elevated surface Stand pivot transfers: Min assist;+2 safety/equipment       General transfer comment: assist to power up; steadying assistance for SPT with cues for sequence. Chair brought up closer to pt    Balance                                           ADL either performed or assessed with clinical judgement   ADL   Eating/Feeding: Set up(using L to self feed)   Grooming: Minimal assistance   Upper Body Bathing: Moderate assistance   Lower Body Bathing: Moderate assistance;Sit to/from stand;+2 for physical assistance   Upper Body Dressing  : Moderate assistance   Lower Body Dressing: +2 for physical assistance;Sit to/from stand;Total assistance   Toilet Transfer: Minimal assistance;Moderate assistance;+2 for physical assistance;+2 for safety/equipment;Stand-pivot;RW(chair)                   Vision         Perception     Praxis      Pertinent Vitals/Pain Pain Score: 5  Pain Location: R hand more than knee Pain Descriptors / Indicators: Aching Pain Intervention(s): Limited activity within patient's tolerance;Monitored during session;Repositioned;RN gave pain meds during session     Hand Dominance Right   Extremity/Trunk Assessment Upper Extremity Assessment Upper Extremity Assessment: RUE deficits/detail RUE Deficits / Details: painful; with edema.  Shoulder AROM 70 with trap recruitment; hand has 1/4 flexion; unable to hold foam for utensils           Communication Communication Communication: No difficulties   Cognition Arousal/Alertness: Awake/alert Behavior During Therapy: WFL for tasks assessed/performed Overall Cognitive Status: Within Functional Limits for tasks assessed                                     General Comments       Exercises     Shoulder Instructions  Home Living Family/patient expects to be discharged to:: Unsure Living Arrangements: Other relatives Available Help at Discharge: Family;Available 24 hours/day               Bathroom Shower/Tub: Walk-in shower         Home Equipment: Bedside commode;Walker - 2 wheels;Cane - single point;Shower seat;Wheelchair - manual   Additional Comments: was at home after rehab      Prior Functioning/Environment Level of Independence: Needs assistance  Gait / Transfers Assistance Needed: performed SPT to w/c ADL's / Homemaking Assistance Needed: assist for adls            OT Problem List: Decreased strength;Decreased activity tolerance;Decreased knowledge of precautions;Decreased knowledge of  use of DME or AE;Impaired balance (sitting and/or standing)      OT Treatment/Interventions: Self-care/ADL training;Patient/family education;DME and/or AE instruction;Therapeutic activities    OT Goals(Current goals can be found in the care plan section) Acute Rehab OT Goals Patient Stated Goal: less pain OT Goal Formulation: With patient Time For Goal Achievement: 12/18/18 Potential to Achieve Goals: Good ADL Goals Pt Will Transfer to Toilet: with min assist;bedside commode;stand pivot transfer Pt Will Perform Toileting - Clothing Manipulation and hygiene: with min assist;sit to/from stand Additional ADL Goal #1: pt will follow through with RUE positioning and ROM for edema management Additional ADL Goal #2: pt will go from sit to stand with min A, maintaining with min guard for 2 minutes for adls  OT Frequency: Min 2X/week   Barriers to D/C:            Co-evaluation PT/OT/SLP Co-Evaluation/Treatment: Yes Reason for Co-Treatment: For patient/therapist safety PT goals addressed during session: Mobility/safety with mobility OT goals addressed during session: ADL's and self-care      AM-PAC OT "6 Clicks" Daily Activity     Outcome Measure Help from another person eating meals?: A Little Help from another person taking care of personal grooming?: A Little Help from another person toileting, which includes using toliet, bedpan, or urinal?: A Lot Help from another person bathing (including washing, rinsing, drying)?: A Lot Help from another person to put on and taking off regular upper body clothing?: A Lot Help from another person to put on and taking off regular lower body clothing?: Total 6 Click Score: 13   End of Session Nurse Communication: Mobility status(maximove pad placed)  Activity Tolerance: Patient limited by pain Patient left: in chair;with call bell/phone within reach;with chair alarm set  OT Visit Diagnosis: Pain;Unsteadiness on feet (R26.81);Muscle weakness  (generalized) (M62.81) Pain - Right/Left: Right Pain - part of body: Arm;Knee                Time: 3474-2595 OT Time Calculation (min): 22 min Charges:  OT General Charges $OT Visit: 1 Visit OT Evaluation $OT Eval Low Complexity: [place], OTR/L Acute Rehabilitation Services [phone] [facility] pager [phone] office 12/04/2018  [place] 12/04/2018, 11:09 AM

## 2018-12-04 NOTE — Progress Notes (Signed)
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 Patient was discharged home from SNF on 12/23 but had no further instructions for abtx through picc line. Essentially has gone unused, unaccesseed, not flushed and now causing discomfort. She was directed to the ED on 12/27 for evaluation.  I instructed to pull picc line due to concern for infection. In the meantime, labs revealed new onset AKi with Cr 2.4 up from BL of 0.89.   I have spoken to dr Zigmund Daniel regarding abtx plan:  1) continue on cefepime or ceftaz, renally dosed until she is discharge  2) when she is ready to be discharged, please contact ID pharmacist, or pharmacy managing on the floor to renally dose cipro to take through Jan 10th as the last day  Thus she will not need a new picc line  3) will arrange for follow up to be seen in 7 days for follow up.  Elzie Rings [facility] Infectious [facility] [phone]

## 2018-12-04 NOTE — Evaluation (Signed)
Physical Therapy Evaluation Patient Details Name: Beth [name] MRN: [id number] DOB: 26-Jul-1946 Today's Date: 12/04/2018   History of Present Illness  72 year old female admitted for acute kidney injury, RUE swelling.  PMHx:  R knee spacer placed on 11/03/18,  h/o DM, HTN, CVA and multiple knee sxs  Clinical Impression  Pt admitted with above diagnosis. Pt currently with functional limitations due to the deficits listed below (see PT Problem List).  Pt with limited mobility d/t pain and deconditioning,recommend SNF vs HHPT with 24hr assist; will follow in acute setting  Pt will benefit from skilled PT to increase their independence and safety with mobility to allow discharge to the venue listed below.       Follow Up Recommendations SNF(vs HHPT with 24hr assist)    Equipment Recommendations  None recommended by PT    Recommendations for Other Services       Precautions / Restrictions Precautions Precautions: Fall Precaution Comments: R hand swollen/painful Required Braces or Orthoses: Knee Immobilizer - Right Knee Immobilizer - Right: On at all times Restrictions Weight Bearing Restrictions: No Other Position/Activity Restrictions: WBAT      Mobility  Bed Mobility Overal bed mobility: Needs Assistance Bed Mobility: Supine to Sit     Supine to sit: Min assist;HOB elevated     General bed mobility comments: cues to self assist, incr time, assist with RLE  Transfers   Equipment used: Rolling walker (2 wheeled) Transfers: Sit to/from [facility] Sit to Stand: Mod assist;+2 physical assistance;From elevated surface Stand pivot transfers: Min assist;+2 safety/equipment       General transfer comment: assist to power up; steadying assistance for SPT with cues for sequence. Chair brought up closer to pt  Ambulation/Gait             General Gait Details: unable d/t pain  Stairs            Wheelchair Mobility    Modified Rankin (Stroke  Patients Only)       Balance Overall balance assessment: Needs assistance Sitting-balance support: Feet supported Sitting balance-[name]: Fair     Standing balance support: Bilateral upper extremity supported Standing balance-[name]: Poor Standing balance comment: UE support and external assist  for balance due to pain, limited weight tolerance R                              Pertinent Vitals/Pain Pain Assessment: 0-10 Pain Score: 5  Pain Location: R hand more than knee Pain Descriptors / Indicators: Aching Pain Intervention(s): Limited activity within patient's tolerance;Monitored during session;RN gave pain meds during session    Home Living Family/patient expects to be discharged to:: Unsure Living Arrangements: Other relatives Available Help at Discharge: Family;Available 24 hours/day Type of Home: House Home Access: Ramped entrance     Home Layout: One level Home Equipment: Bedside commode;Walker - 2 wheels;Cane - single point;Shower seat;Wheelchair - manual Additional Comments: was at home after rehab after a few days    Prior Function Level of Independence: Needs assistance   Gait / Transfers Assistance Needed: pt reports she was amb with RW and assist   ADL's / Homemaking Assistance Needed: assist for adls        Hand Dominance   Dominant Hand: Right    Extremity/Trunk Assessment   Upper Extremity Assessment Upper Extremity Assessment: Defer to OT evaluation RUE Deficits / Details: painful; with edema.  Shoulder AROM 70 with trap  recruitment; hand has 1/4 flexion; unable to hold foam for utensils    Lower Extremity Assessment RLE Deficits / Details: ankle AROM WFL, able to perform quad set, limited extension to about 20, flexion NT LLE Deficits / Details: arthritic changes noted in knee joint, moves about 65 degrees in supine, approximately 90* flexion in sitting with some crepitus noted       Communication   Communication: No  difficulties  Cognition Arousal/Alertness: Awake/alert Behavior During Therapy: WFL for tasks assessed/performed Overall Cognitive Status: Within Functional Limits for tasks assessed                                        General Comments      Exercises Total Joint Exercises Ankle Circles/Pumps: AROM;10 reps;Both;Supine   Assessment/Plan    PT Assessment Patient needs continued PT services  PT Problem List Decreased strength;Decreased mobility;Decreased range of motion;Decreased knowledge of precautions;Decreased activity tolerance;Decreased balance;Decreased knowledge of use of DME;Pain       PT Treatment Interventions DME instruction;Therapeutic activities;Therapeutic exercise;Gait training;Balance training;Functional mobility training;Patient/family education    PT Goals (Current goals can be found in the Care Plan section)  Acute Rehab PT Goals Patient Stated Goal: less pain PT Goal Formulation: With patient Time For Goal Achievement: 12/18/18 Potential to Achieve Goals: Fair    Frequency Min 3X/week   Barriers to discharge        Co-evaluation PT/OT/SLP Co-Evaluation/Treatment: Yes Reason for Co-Treatment: For patient/therapist safety PT goals addressed during session: Mobility/safety with mobility OT goals addressed during session: ADL's and self-care       AM-PAC PT "6 Clicks" Mobility  Outcome Measure Help needed turning from your back to your side while in a flat bed without using bedrails?: A Lot Help needed moving from lying on your back to sitting on the side of a flat bed without using bedrails?: A Lot Help needed moving to and from a bed to a chair (including a wheelchair)?: A Lot Help needed standing up from a chair using your arms (e.g., wheelchair or bedside chair)?: A Lot Help needed to walk in hospital room?: Total Help needed climbing 3-5 steps with a railing? : Total 6 Click Score: 10    End of Session Equipment Utilized  During Treatment: Gait belt;Right knee immobilizer Activity Tolerance: Patient limited by fatigue Patient left: in chair;with call bell/phone within reach;with chair alarm set;Other (comment)(on maximove pad )   PT Visit Diagnosis: Difficulty in walking, not elsewhere classified (R26.2);Pain Pain - Right/Left: Right Pain - part of body: Knee;Hand    Time: 7342-8768 PT Time Calculation (min) (ACUTE ONLY): 23 min   Charges:   PT Evaluation $PT Eval Low Complexity: 1 Low          Kenyon Ana, PT  Pager: [phone] Acute Rehab Dept [facility]): [phone]   12/04/2018   [facility] 12/04/2018, 12:31 PM

## 2018-12-04 NOTE — Progress Notes (Signed)
Patient received from ED at 2320, BP of 201/108. BP medications were administered, PRN pain meds also administered. Patient slept well with no significant changes. At 0600 BP is 171/94, On Call MD is paged, awaiting further instructions. Pt with no complaints of dizziness, chest pain, headache, nausea or vomiting. Will continue to monitor.

## 2018-12-04 NOTE — Progress Notes (Addendum)
PROGRESS NOTE    Beth [name]  ZOX:[id number] DOB: 15-Nov-1946 DOA: 12/03/2018 PCP: Claretta Fraise, MD   Brief Narrative:72 y.o. female with medical history significant for hypertension, type 2 diabetes, hyperlipidemia, and recent infection of right total knee arthroplasty s/p resection of arthroplasty and placement of antibiotic spacer on 11/03/2018 requiring long-term IV antibiotics via RUE PICC line who presents to the ED with about 3 days of swelling and pain of her right upper extremity.  After her surgery she was discharged to [facility] rehab facility in which time she is receiving continued IV antibiotics via her PICC line.  She was discharged from her rehab facility on 11/29/2018 to home.  She says that she did not receive any home health nursing, line care, or antibiotics at home.  About 3 days ago she started to notice the swelling at her right upper extremity associated with pain.  She did not see any skin changes.  She has kept the leg brace on at her right leg but has not noticed any significant changes at her right knee.  She denies any fevers, chest pain, or shortness of breath.    ED Course:  Initial vitals showed BP 200/105, pulse 71, RR 18, temp 97.8 Fahrenheit, SPO2 97% on room air. Labs are notable for BUN 48, creatinine 2.54 (0.86 on 11/05/2018).  Patient was given 1 L normal saline.  RUE Doppler ultrasound was negative for DVT.  The case was discussed with on-call ID who recommended PICC line removal and can use oral ciprofloxacin to treat until IV access reestablished.  The hospitalist service was consulted to admit for acute kidney injury. 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  Last month her creatinine was 0.86.  With IV hydration her creatinine has come down to 2.41 today.  We will continue IV fluids.  Check renal ultrasound.  The last renal ultrasound was in 2014.  PICC line associated RUE swelling and pain: RUE Doppler ultrasound negative for acute DVT.  PICC line was pulled in the ED. -Continue to monitor  Infection of right TKA s/p arthroplasty resection and spacer placement: Has been on IV cefepime via RUE PICC line which was pulled in the ED as above.  Peripheral IV placed in ED.  Had planned 6-week antibiotic course with end date 12/17/2018. -Continue IV cefepime while inpatient -Will need to clarify with ID on replacement of PICC line for completion of IV antibiotics versus transition to oral Cipro- -per ID continue cefepime or cefazolin while in hospital and change of ciprofloxacin at the time of discharge.     Type 2 diabetes: On metformin at home.  Hold metformin -SSI while inpatient  Hypertension: Blood pressures elevated on admission. -Restart home amlodipine, hydralazine, nebivolol -IV hydralazine as needed -Increase the dose of hydralazine 100 mg 4 times a day.  Hyperlipidemia: -Continue atorvastatin  Severity patient needs continued IV hydration for acute kidney injury.  And will need SNF placement.  Estimated body mass index is 36.28 kg/m as calculated from the following:   Height as of this encounter: 5\' 5"  (1.651 m).   Weight as of this encounter: 98.9 kg.  DVT  prophylaxis: Heparin Code Status: Full code Family Communication: Discussed with patient Disposition Plan: Seen by physical therapy recommends skilled nursing facility  Consultants:  None  Procedures: None Antimicrobials: Cefepime  Subjective:  resting in bed reports no new complaints other than itching on the back no nausea vomiting diarrhea fever chills cough reported   Objective: Vitals:    12/04/18 0215 12/04/18 0500 12/04/18 0540 12/04/18 0641  BP: (!) 164/86  (!) 171/94 (!) 160/89  Pulse: 79  77 75  Resp: 16  16   Temp: 98.6 F (37 C)  98.6 F (37 C)   TempSrc: Oral  Oral   SpO2: 95%  97%   Weight:  98.9 kg    Height:        Intake/Output Summary (Last 24 hours) at 12/04/2018 1127 Last data filed at 12/04/2018 1013 Gross per 24 hour  Intake 1914.78 ml  Output 1500 ml  Net 414.78 ml   Filed Weights   12/03/18 2320 12/03/18 2351 12/04/18 0500  Weight: 98 kg 98 kg 98.9 kg    Examination:  General exam: Appears calm and comfortable  Respiratory system: Clear to auscultation. Respiratory effort normal. Cardiovascular system: S1 & S2 heard, RRR. No JVD, murmurs, rubs, gallops or clicks. No pedal edema. Gastrointestinal system: Abdomen is nondistended, soft and nontender. No organomegaly or masses felt. Normal bowel sounds heard. Central nervous system: Alert and oriented. No focal neurological deficits. Extremities: Trace right lower extremity edema with brace in place right knee scar no erythema no drainage Skin: No rashes, lesions or ulcers Psychiatry: Judgement and insight appear normal. Mood & affect appropriate.     Data Reviewed: I have personally reviewed following labs and imaging studies  CBC: Recent Labs  Lab 12/03/18 1955 12/04/18 0404  WBC 6.7 5.5  HGB 9.2* 8.7*  HCT 31.3* 29.6*  MCV 84.1 82.7  PLT 267 834   Basic Metabolic Panel: Recent Labs  Lab 12/03/18 1955 12/04/18 0404  NA 139 138  K 3.7 3.2*  CL 107 108  CO2 23 19*  GLUCOSE 147* 135*  BUN 48* 43*  CREATININE 2.54* 2.41*  CALCIUM 9.0 8.5*   GFR: Estimated Creatinine Clearance: 23.9 mL/min (A) (by C-G formula based on SCr of 2.41 mg/dL (H)). Liver Function Tests: Recent Labs  Lab 12/03/18 1955  AST 13*  ALT 10  ALKPHOS 109  BILITOT 1.1  PROT 7.6  ALBUMIN 3.1*   No results for input(s): LIPASE, AMYLASE in the last 168 hours. No results for input(s): AMMONIA in  the last 168 hours. Coagulation Profile: No results for input(s): INR, PROTIME in the last 168 hours. Cardiac Enzymes: No results for input(s): CKTOTAL, CKMB, CKMBINDEX, TROPONINI in the last 168 hours. BNP (last 3 results) No results for input(s): PROBNP in the last 8760 hours. HbA1C: No results for input(s): HGBA1C in the last 72 hours. CBG: Recent Labs  Lab 12/04/18 0051 12/04/18 0749  GLUCAP 178* 113*   Lipid Profile: No results for input(s): CHOL, HDL, LDLCALC, TRIG, CHOLHDL, LDLDIRECT in the last 72 hours. Thyroid Function Tests: No results for input(s): TSH, T4TOTAL, FREET4, T3FREE, THYROIDAB in the last 72 hours. Anemia Panel: No results for input(s): VITAMINB12, FOLATE, FERRITIN, TIBC, IRON, RETICCTPCT in the last 72 hours. Sepsis Labs: No results for input(s): PROCALCITON, LATICACIDVEN in the last 168 hours.  No results found for this or any previous visit (from the past 240 hour(s)).       Radiology Studies: Ue Venous Duplex (mc & Wl 7 Am -  7 Pm)  Result Date: 12/03/2018 UPPER VENOUS STUDY  Indications: Pain, and Swelling Limitations: Body habitus, line and PICC on site. Comparison Study: No prior study available Performing Technologist: Lorina Rabon  Examination Guidelines: A complete evaluation includes B-mode imaging, spectral Doppler, color Doppler, and power Doppler as needed of all accessible portions of each vessel. Bilateral testing is considered an integral part of a complete examination. Limited examinations for reoccurring indications may be performed as noted.  Right Findings: +----------+------------+----------+---------+-----------+---------------------+ RIGHT     CompressiblePropertiesPhasicitySpontaneous       Summary        +----------+------------+----------+---------+-----------+---------------------+ IJV           Full                 Yes       Yes                           +----------+------------+----------+---------+-----------+---------------------+ Subclavian    Full                 Yes       Yes        PICC on site      +----------+------------+----------+---------+-----------+---------------------+ Axillary      Full                           Yes        PICC on site      +----------+------------+----------+---------+-----------+---------------------+ Brachial      Full                           Yes     Limited visibility                                                         due to IV on site   +----------+------------+----------+---------+-----------+---------------------+ Radial        Full                                                        +----------+------------+----------+---------+-----------+---------------------+ Ulnar         Full                                                        +----------+------------+----------+---------+-----------+---------------------+ Cephalic      Full                                                        +----------+------------+----------+---------+-----------+---------------------+ Basilic       Full                           Yes                          +----------+------------+----------+---------+-----------+---------------------+  Study limited due to PICC on site.  Left Findings: +----------+------------+----------+---------+-----------+-------+ LEFT      CompressiblePropertiesPhasicitySpontaneousSummary +----------+------------+----------+---------+-----------+-------+ Subclavian    Full                 Yes       Yes            +----------+------------+----------+---------+-----------+-------+  Summary:  Right: No evidence of deep vein thrombosis in the upper extremity. No evidence of superficial vein thrombosis in the upper extremity. No evidence of thrombosis in the subclavian. Study limited due to PICC on site.  Left: No evidence of thrombosis in the  subclavian.  *See table(s) above for measurements and observations.    Preliminary         Scheduled Meds: . amLODipine  10 mg Oral QPM  . atorvastatin  20 mg Oral QPM  . heparin  5,000 Units Subcutaneous Q8H  . hydrALAZINE  100 mg Oral TID  . insulin aspart  0-5 Units Subcutaneous QHS  . insulin aspart  0-9 Units Subcutaneous TID WC  . nebivolol  20 mg Oral Daily   Continuous Infusions: . [START ON 12/05/2018] ceFEPime (MAXIPIME) IV       LOS: 0 days      Georgette Shell, MD [facility]   If 7PM-7AM, please contact night-coverage [email or web address] Password [facility] 12/04/2018, 11:27 AM

## 2018-12-04 NOTE — Plan of Care (Signed)
Pt stable this am at time of assessment. No changes needed to care plans. Rn medicating for pain and other needs.

## 2018-12-05 LAB — BASIC METABOLIC PANEL
Anion gap: 9 (ref 5–15)
BUN: 38 mg/dL — ABNORMAL HIGH (ref 8–23)
CO2: 20 mmol/L — ABNORMAL LOW (ref 22–32)
Calcium: 8.2 mg/dL — ABNORMAL LOW (ref 8.9–10.3)
Chloride: 110 mmol/L (ref 98–111)
Creatinine, Ser: 2.09 mg/dL — ABNORMAL HIGH (ref 0.44–1.00)
GFR calc Af Amer: 26 mL/min — ABNORMAL LOW (ref >60)
GFR, EST NON AFRICAN AMERICAN: 23 mL/min — AB (ref >60)
Glucose, Bld: 119 mg/dL — ABNORMAL HIGH (ref 70–99)
Potassium: 3.3 mmol/L — ABNORMAL LOW (ref 3.5–5.1)
Sodium: 139 mmol/L (ref 135–145)

## 2018-12-05 LAB — CBC
HCT: 25.8 % — ABNORMAL LOW (ref 36.0–46.0)
Hemoglobin: 7.5 g/dL — ABNORMAL LOW (ref 12.0–15.0)
MCH: 24.8 pg — ABNORMAL LOW (ref 26.0–34.0)
MCHC: 29.1 g/dL — AB (ref 30.0–36.0)
MCV: 85.1 fL (ref 80.0–100.0)
Platelets: 224 10*3/uL (ref 150–400)
RBC: 3.03 MIL/uL — ABNORMAL LOW (ref 3.87–5.11)
RDW: 17.2 % — ABNORMAL HIGH (ref 11.5–15.5)
WBC: 5.2 10*3/uL (ref 4.0–10.5)
nRBC: 0 % (ref 0.0–0.2)

## 2018-12-05 LAB — GLUCOSE, CAPILLARY
GLUCOSE-CAPILLARY: 168 mg/dL — AB (ref 70–99)
Glucose-Capillary: 150 mg/dL — ABNORMAL HIGH (ref 70–99)
Glucose-Capillary: 156 mg/dL — ABNORMAL HIGH (ref 70–99)
Glucose-Capillary: 180 mg/dL — ABNORMAL HIGH (ref 70–99)

## 2018-12-05 LAB — PREPARE RBC (CROSSMATCH)

## 2018-12-05 MED ORDER — SODIUM CHLORIDE 0.9% IV SOLUTION
Freq: Once | INTRAVENOUS | Status: AC
  Administered 2018-12-05: 16:00:00 via INTRAVENOUS

## 2018-12-05 MED ORDER — DIPHENHYDRAMINE HCL 25 MG PO CAPS
25.0000 mg | ORAL_CAPSULE | Freq: Four times a day (QID) | ORAL | Status: DC | PRN
  Administered 2018-12-05 – 2018-12-06 (×2): 25 mg via ORAL
  Filled 2018-12-05 (×2): qty 1

## 2018-12-05 NOTE — Progress Notes (Signed)
Pt stable at this time. Blood infused without complication. Pt and pt family complaining her right arm is more painful and swollen. They did tell rn she has a history of gout. rn messaged md about this concern. Pt will good pulses in right arm. This arm has been had a previous US and is negative for dvt.

## 2018-12-05 NOTE — Progress Notes (Signed)
PROGRESS NOTE    Beth [name]  [name] DOB: 04/23/1946 DOA: 12/03/2018 PCP: Claretta Fraise, MD   Brief Narrative:72 y.o.femalewith medical history significant forhypertension, type 2 diabetes, hyperlipidemia, and recent infection of right total knee arthroplastys/presection of arthroplasty and placement of antibiotic spacer on 11/03/2018 requiring long-term IV antibiotics via RUE PICC line who presents to the ED with about 3 days of swelling and pain of her right upper extremity. After her surgery she was discharged to [facility] rehab facility in which time she is receiving continued IV antibiotics via her PICC line. She was discharged from her rehab facility on 11/29/2018 to home. She says that she did not receive any home health nursing, line care, or antibiotics at home. About 3 days ago she started to notice the swelling at her right upper extremity associated with pain. She did not see any skin changes. She has kept the leg brace on at her right leg but has not noticed any significant changes at her right knee. She denies any fevers, chest pain, or shortness of breath.   ED Course: Initial vitals showed BP 200/105, pulse 71, RR 18, temp 97.8 Fahrenheit, SPO2 97% on room air. Labs are notable for BUN 48, creatinine 2.54 (0.86 on 11/05/2018).  Patient was given 1 L normal saline. RUE Doppler ultrasound was negative for DVT. The case was discussed with on-call ID who recommended PICC line removal and can use oral ciprofloxacin to treat until IV access reestablished. The hospitalist service was consulted to admit for acute kidney injury.  Assessment & Plan:   Principal Problem:   Acute kidney injury ([facility]) Active Problems:   HLD (hyperlipidemia)   Essential hypertension   Diabetes type 2, controlled ([place])   Infection of total right knee replacement (HCC)   S/P PICC central line placement    Acute kidney injury in the setting of dehydration metformin and  olmesartan and uncontrolled hypertension- creatinine on admission was 2.54.  Last month her creatinine was 0.86.  With IV hydration her creatinine has come down to 2.01 today.  We will continue IV fluids.  Continue to hold metformin and ACE inhibitor.   renal ultrasound is normal.  PICC line associatedRUE swelling and pain: RUE Doppler ultrasound negative for acute DVT. PICC line was pulled in the ED. -Continue to monitor  Infection of right TKAs/parthroplasty resection and spacer placement: Has been on IV cefepime via RUE PICC line which was pulled in the ED as above. Peripheral IV placed in ED. Had planned 6-week antibiotic course with end date 12/17/2018. -Continue IV cefepime while inpatient -Discussed with ID recommends starting p.o. Cipro renally dosed at the time of discharge.  Patient will not need a new PICC line.  Follow-up with ID in 1 to 2 weeks after discharge. -  Type 2 diabetes: On metformin at home.  Hold metformin.  Blood sugar stable. -SSI while inpatient  Hypertension: Blood pressures elevated on admission. -Restart home amlodipine, hydralazine, nebivolol -IV hydralazine as needed -Increase the dose of hydralazine 100 mg 4 times a day.  Hyperlipidemia: -Continue atorvastatin  Chronic anemia baseline hemoglobin at the time of admission her hemoglobin was 9.2.  It came down to 7.5 today.  It is probably related to hemodilution as she was getting IV fluids from the time of admission.  But I will transfuse her 1 unit of packed RBC today.    Estimated body mass index is 35.55 kg/m as calculated from the following:   Height as of this encounter: 5'  5" (1.651 m).   Weight as of this encounter: 96.9 kg.  DVT prophylaxis: Heparin Code Status: Full code Family Communication no family available Disposition Plan: Pending improvement in her kidney function with IV hydration and PT recommends again SNF placement. Consultants: Infectious disease   Procedures:  None Antimicrobials cefepime  Subjective: Resting in bed no complaints of nausea vomiting diarrhea chest pain complains of discomfort in the right upper extremity.  Objective: Vitals:   12/04/18 2110 12/04/18 2139 12/04/18 2352 12/05/18 0501  BP: (!) 152/83 102/74 (!) 162/84 (!) 164/84  Pulse: 75 66  72  Resp: 16 16  18   Temp: 99.2 F (37.3 C) 97.6 F (36.4 C)  99.3 F (37.4 C)  TempSrc: Oral Oral  Oral  SpO2: 97% 96%  97%  Weight:    96.9 kg  Height:        Intake/Output Summary (Last 24 hours) at 12/05/2018 1116 Last data filed at 12/05/2018 1020 Gross per 24 hour  Intake 4009.01 ml  Output 1600 ml  Net 2409.01 ml   Filed Weights   12/03/18 2351 12/04/18 0500 12/05/18 0501  Weight: 98 kg 98.9 kg 96.9 kg    Examination:  General exam: Appears calm and comfortable  Respiratory system: Clear to auscultation. Respiratory effort normal. Cardiovascular system: S1 & S2 heard, RRR. No JVD, murmurs, rubs, gallops or clicks. No pedal edema. Gastrointestinal system: Abdomen is nondistended, soft and nontender. No organomegaly or masses felt. Normal bowel sounds heard. Central nervous system: Alert and oriented. No focal neurological deficits. Extremities: 1+ right upper extremity edema right lower extremity in cast Skin: No rashes, lesions or ulcers Psychiatry: Judgement and insight appear normal. Mood & affect appropriate.     Data Reviewed: I have personally reviewed following labs and imaging studies  CBC: Recent Labs  Lab 12/03/18 1955 12/04/18 0404 12/05/18 0437  WBC 6.7 5.5 5.2  HGB 9.2* 8.7* 7.5*  HCT 31.3* 29.6* 25.8*  MCV 84.1 82.7 85.1  PLT 267 253 734   Basic Metabolic Panel: Recent Labs  Lab 12/03/18 1955 12/04/18 0404 12/05/18 0437  NA 139 138 139  K 3.7 3.2* 3.3*  CL 107 108 110  CO2 23 19* 20*  GLUCOSE 147* 135* 119*  BUN 48* 43* 38*  CREATININE 2.54* 2.41* 2.09*  CALCIUM 9.0 8.5* 8.2*   GFR: Estimated Creatinine Clearance: 27.2 mL/min  (A) (by C-G formula based on SCr of 2.09 mg/dL (H)). Liver Function Tests: Recent Labs  Lab 12/03/18 1955  AST 13*  ALT 10  ALKPHOS 109  BILITOT 1.1  PROT 7.6  ALBUMIN 3.1*   No results for input(s): LIPASE, AMYLASE in the last 168 hours. No results for input(s): AMMONIA in the last 168 hours. Coagulation Profile: No results for input(s): INR, PROTIME in the last 168 hours. Cardiac Enzymes: No results for input(s): CKTOTAL, CKMB, CKMBINDEX, TROPONINI in the last 168 hours. BNP (last 3 results) No results for input(s): PROBNP in the last 8760 hours. HbA1C: No results for input(s): HGBA1C in the last 72 hours. CBG: Recent Labs  Lab 12/04/18 0749 12/04/18 1149 12/04/18 1626 12/04/18 2130 12/05/18 0723  GLUCAP 113* 120* 115* 156* 168*   Lipid Profile: No results for input(s): CHOL, HDL, LDLCALC, TRIG, CHOLHDL, LDLDIRECT in the last 72 hours. Thyroid Function Tests: No results for input(s): TSH, T4TOTAL, FREET4, T3FREE, THYROIDAB in the last 72 hours. Anemia Panel: No results for input(s): VITAMINB12, FOLATE, FERRITIN, TIBC, IRON, RETICCTPCT in the last 72 hours. Sepsis Labs: No results  for input(s): PROCALCITON, LATICACIDVEN in the last 168 hours.  No results found for this or any previous visit (from the past 240 hour(s)).       Radiology Studies: US Renal  Result Date: 12/04/2018 CLINICAL DATA:  Acute kidney injury.  History of hypertension. EXAM: RENAL / URINARY TRACT ULTRASOUND COMPLETE COMPARISON:  None applicable FINDINGS: Right Kidney: Renal measurements: 9.7 x 4.6 x 5.9 centimeter = volume: 135.9 mL . Echogenicity within normal limits. No mass or hydronephrosis visualized. Left Kidney: Renal measurements: 8.9 x 5.8 x 4.3 centimeters = volume: 116.2 mL. Echogenicity within normal limits. No mass or hydronephrosis visualized. Bladder: Appears normal for degree of bladder distention. Additional: Study quality is degraded by overlying bowel loops. IMPRESSION: Normal  renal ultrasound. Electronically Signed   By: Nolon Nations M.D.   On: 12/04/2018 14:18   Ue Venous Duplex (mc & Wl 7 Am - 7 Pm)  Result Date: 12/05/2018 UPPER VENOUS STUDY  Indications: Pain, and Swelling Limitations: Body habitus, line and PICC on site. Comparison Study: No prior study available Performing Technologist: Lorina Rabon  Examination Guidelines: A complete evaluation includes B-mode imaging, spectral Doppler, color Doppler, and power Doppler as needed of all accessible portions of each vessel. Bilateral testing is considered an integral part of a complete examination. Limited examinations for reoccurring indications may be performed as noted.  Right Findings: +----------+------------+----------+---------+-----------+---------------------+ RIGHT     CompressiblePropertiesPhasicitySpontaneous       Summary        +----------+------------+----------+---------+-----------+---------------------+ IJV           Full                 Yes       Yes                          +----------+------------+----------+---------+-----------+---------------------+ Subclavian    Full                 Yes       Yes        PICC on site      +----------+------------+----------+---------+-----------+---------------------+ Axillary      Full                           Yes        PICC on site      +----------+------------+----------+---------+-----------+---------------------+ Brachial      Full                           Yes     Limited visibility                                                         due to IV on site   +----------+------------+----------+---------+-----------+---------------------+ Radial        Full                                                        +----------+------------+----------+---------+-----------+---------------------+ Ulnar         Full                                                         +----------+------------+----------+---------+-----------+---------------------+  Cephalic      Full                                                        +----------+------------+----------+---------+-----------+---------------------+ Basilic       Full                           Yes                          +----------+------------+----------+---------+-----------+---------------------+ Study limited due to PICC on site.  Left Findings: +----------+------------+----------+---------+-----------+-------+ LEFT      CompressiblePropertiesPhasicitySpontaneousSummary +----------+------------+----------+---------+-----------+-------+ Subclavian    Full                 Yes       Yes            +----------+------------+----------+---------+-----------+-------+  Summary:  Right: No evidence of deep vein thrombosis in the upper extremity. No evidence of superficial vein thrombosis in the upper extremity. No evidence of thrombosis in the subclavian. Study limited due to PICC on site.  Left: No evidence of thrombosis in the subclavian.  *See table(s) above for measurements and observations.  Diagnosing physician: Curt Jews MD Electronically signed by Curt Jews MD on 12/05/2018 at 9:05:29 AM.    Final         Scheduled Meds: . sodium chloride   Intravenous Once  . amLODipine  10 mg Oral QPM  . atorvastatin  20 mg Oral QPM  . heparin  5,000 Units Subcutaneous Q8H  . hydrALAZINE  100 mg Oral Q6H  . insulin aspart  0-5 Units Subcutaneous QHS  . insulin aspart  0-9 Units Subcutaneous TID WC  . nebivolol  20 mg Oral Daily   Continuous Infusions: . sodium chloride 100 mL/hr at 12/05/18 1020  . ceFEPime (MAXIPIME) IV 2 g (12/05/18 0402)     LOS: 0 days     Georgette Shell, MD [facility]  If 7PM-7AM, please contact night-coverage [email or web address] Password [email or web address] 12/05/2018, 11:16 AM

## 2018-12-05 NOTE — Plan of Care (Signed)
Pt stable with no needs. No changes to note. Pt seems depressed and down overall this morning. No s/s of distress. Rn medicated pt for pain.

## 2018-12-05 NOTE — Progress Notes (Signed)
Nutrition Brief Note  Patient identified on the Malnutrition Screening Tool (MST) Report  Patient with overall weight gain since 2017. Pt currently eating 100% of meals.  Wt Readings from Last 15 Encounters:  12/05/18 96.9 kg  11/03/18 98 kg  08/11/18 98.4 kg  06/07/18 92.1 kg  12/30/17 97.5 kg  12/18/17 95.7 kg  08/28/17 93 kg  05/08/17 91.2 kg  04/22/17 93.2 kg  04/03/17 92.5 kg  02/26/17 89.4 kg  01/14/17 88.9 kg  01/08/17 89.3 kg  12/16/16 84.4 kg  08/27/16 88.2 kg    Body mass index is 35.55 kg/m. Patient meets criteria for obesity based on current BMI.   Current diet order is HH/CHO modified, patient is consuming approximately 100% of meals at this time. Labs and medications reviewed.   No nutrition interventions warranted at this time. If nutrition issues arise, please consult RD.   Beth Bibles, MS, RD, [place] Dietitian Pager: [phone] After Hours Pager: [phone]

## 2018-12-06 ENCOUNTER — Ambulatory Visit: Payer: Medicare Other | Admitting: Family Medicine

## 2018-12-06 ENCOUNTER — Telehealth: Payer: Self-pay | Admitting: Family Medicine

## 2018-12-06 DIAGNOSIS — M009 Pyogenic arthritis, unspecified: Secondary | ICD-10-CM | POA: Diagnosis not present

## 2018-12-06 DIAGNOSIS — Z8041 Family history of malignant neoplasm of ovary: Secondary | ICD-10-CM | POA: Diagnosis not present

## 2018-12-06 DIAGNOSIS — D638 Anemia in other chronic diseases classified elsewhere: Secondary | ICD-10-CM | POA: Diagnosis present

## 2018-12-06 DIAGNOSIS — E7800 Pure hypercholesterolemia, unspecified: Secondary | ICD-10-CM | POA: Diagnosis not present

## 2018-12-06 DIAGNOSIS — Z7984 Long term (current) use of oral hypoglycemic drugs: Secondary | ICD-10-CM | POA: Diagnosis not present

## 2018-12-06 DIAGNOSIS — E782 Mixed hyperlipidemia: Secondary | ICD-10-CM | POA: Diagnosis not present

## 2018-12-06 DIAGNOSIS — E1129 Type 2 diabetes mellitus with other diabetic kidney complication: Secondary | ICD-10-CM | POA: Diagnosis not present

## 2018-12-06 DIAGNOSIS — Z7401 Bed confinement status: Secondary | ICD-10-CM | POA: Diagnosis not present

## 2018-12-06 DIAGNOSIS — M6281 Muscle weakness (generalized): Secondary | ICD-10-CM | POA: Diagnosis not present

## 2018-12-06 DIAGNOSIS — Y831 Surgical operation with implant of artificial internal device as the cause of abnormal reaction of the patient, or of later complication, without mention of misadventure at the time of the procedure: Secondary | ICD-10-CM | POA: Diagnosis present

## 2018-12-06 DIAGNOSIS — T8453XA Infection and inflammatory reaction due to internal right knee prosthesis, initial encounter: Secondary | ICD-10-CM | POA: Diagnosis not present

## 2018-12-06 DIAGNOSIS — Z8673 Personal history of transient ischemic attack (TIA), and cerebral infarction without residual deficits: Secondary | ICD-10-CM | POA: Diagnosis not present

## 2018-12-06 DIAGNOSIS — I10 Essential (primary) hypertension: Secondary | ICD-10-CM | POA: Diagnosis not present

## 2018-12-06 DIAGNOSIS — T8453XD Infection and inflammatory reaction due to internal right knee prosthesis, subsequent encounter: Secondary | ICD-10-CM | POA: Diagnosis not present

## 2018-12-06 DIAGNOSIS — Z888 Allergy status to other drugs, medicaments and biological substances status: Secondary | ICD-10-CM | POA: Diagnosis not present

## 2018-12-06 DIAGNOSIS — N183 Chronic kidney disease, stage 3 (moderate): Secondary | ICD-10-CM | POA: Diagnosis present

## 2018-12-06 DIAGNOSIS — Z90710 Acquired absence of both cervix and uterus: Secondary | ICD-10-CM | POA: Diagnosis not present

## 2018-12-06 DIAGNOSIS — E785 Hyperlipidemia, unspecified: Secondary | ICD-10-CM | POA: Diagnosis present

## 2018-12-06 DIAGNOSIS — Z7901 Long term (current) use of anticoagulants: Secondary | ICD-10-CM | POA: Diagnosis not present

## 2018-12-06 DIAGNOSIS — Z8249 Family history of ischemic heart disease and other diseases of the circulatory system: Secondary | ICD-10-CM | POA: Diagnosis not present

## 2018-12-06 DIAGNOSIS — Z886 Allergy status to analgesic agent status: Secondary | ICD-10-CM | POA: Diagnosis not present

## 2018-12-06 DIAGNOSIS — I200 Unstable angina: Secondary | ICD-10-CM | POA: Diagnosis not present

## 2018-12-06 DIAGNOSIS — I129 Hypertensive chronic kidney disease with stage 1 through stage 4 chronic kidney disease, or unspecified chronic kidney disease: Secondary | ICD-10-CM | POA: Diagnosis present

## 2018-12-06 DIAGNOSIS — R7989 Other specified abnormal findings of blood chemistry: Secondary | ICD-10-CM | POA: Diagnosis not present

## 2018-12-06 DIAGNOSIS — N179 Acute kidney failure, unspecified: Secondary | ICD-10-CM | POA: Diagnosis not present

## 2018-12-06 DIAGNOSIS — D649 Anemia, unspecified: Secondary | ICD-10-CM | POA: Diagnosis not present

## 2018-12-06 DIAGNOSIS — E1122 Type 2 diabetes mellitus with diabetic chronic kidney disease: Secondary | ICD-10-CM | POA: Diagnosis not present

## 2018-12-06 DIAGNOSIS — Z6837 Body mass index (BMI) 37.0-37.9, adult: Secondary | ICD-10-CM | POA: Diagnosis not present

## 2018-12-06 DIAGNOSIS — M2550 Pain in unspecified joint: Secondary | ICD-10-CM | POA: Diagnosis not present

## 2018-12-06 DIAGNOSIS — Z91013 Allergy to seafood: Secondary | ICD-10-CM | POA: Diagnosis not present

## 2018-12-06 DIAGNOSIS — R262 Difficulty in walking, not elsewhere classified: Secondary | ICD-10-CM | POA: Diagnosis not present

## 2018-12-06 DIAGNOSIS — Z96651 Presence of right artificial knee joint: Secondary | ICD-10-CM | POA: Diagnosis present

## 2018-12-06 DIAGNOSIS — Z833 Family history of diabetes mellitus: Secondary | ICD-10-CM | POA: Diagnosis not present

## 2018-12-06 DIAGNOSIS — E860 Dehydration: Secondary | ICD-10-CM | POA: Diagnosis present

## 2018-12-06 DIAGNOSIS — E876 Hypokalemia: Secondary | ICD-10-CM | POA: Diagnosis not present

## 2018-12-06 DIAGNOSIS — E1165 Type 2 diabetes mellitus with hyperglycemia: Secondary | ICD-10-CM | POA: Diagnosis not present

## 2018-12-06 DIAGNOSIS — R011 Cardiac murmur, unspecified: Secondary | ICD-10-CM | POA: Diagnosis not present

## 2018-12-06 DIAGNOSIS — M109 Gout, unspecified: Secondary | ICD-10-CM | POA: Diagnosis present

## 2018-12-06 LAB — CBC
HCT: 30.9 % — ABNORMAL LOW (ref 36.0–46.0)
Hemoglobin: 9.2 g/dL — ABNORMAL LOW (ref 12.0–15.0)
MCH: 24.7 pg — AB (ref 26.0–34.0)
MCHC: 29.8 g/dL — ABNORMAL LOW (ref 30.0–36.0)
MCV: 83.1 fL (ref 80.0–100.0)
Platelets: 257 10*3/uL (ref 150–400)
RBC: 3.72 MIL/uL — AB (ref 3.87–5.11)
RDW: 16.7 % — ABNORMAL HIGH (ref 11.5–15.5)
WBC: 5.6 10*3/uL (ref 4.0–10.5)
nRBC: 0 % (ref 0.0–0.2)

## 2018-12-06 LAB — BASIC METABOLIC PANEL
Anion gap: 10 (ref 5–15)
BUN: 35 mg/dL — ABNORMAL HIGH (ref 8–23)
CO2: 20 mmol/L — ABNORMAL LOW (ref 22–32)
Calcium: 8.4 mg/dL — ABNORMAL LOW (ref 8.9–10.3)
Chloride: 111 mmol/L (ref 98–111)
Creatinine, Ser: 2.02 mg/dL — ABNORMAL HIGH (ref 0.44–1.00)
GFR calc Af Amer: 27 mL/min — ABNORMAL LOW (ref >60)
GFR calc non Af Amer: 24 mL/min — ABNORMAL LOW (ref >60)
Glucose, Bld: 119 mg/dL — ABNORMAL HIGH (ref 70–99)
POTASSIUM: 3.2 mmol/L — AB (ref 3.5–5.1)
Sodium: 141 mmol/L (ref 135–145)

## 2018-12-06 LAB — BPAM RBC
Blood Product Expiration Date: 202001242359
ISSUE DATE / TIME: 201912291146
Unit Type and Rh: 5100

## 2018-12-06 LAB — GLUCOSE, CAPILLARY
Glucose-Capillary: 124 mg/dL — ABNORMAL HIGH (ref 70–99)
Glucose-Capillary: 127 mg/dL — ABNORMAL HIGH (ref 70–99)
Glucose-Capillary: 136 mg/dL — ABNORMAL HIGH (ref 70–99)
Glucose-Capillary: 162 mg/dL — ABNORMAL HIGH (ref 70–99)

## 2018-12-06 LAB — TYPE AND SCREEN
ABO/RH(D): O POS
Antibody Screen: NEGATIVE
Unit division: 0

## 2018-12-06 MED ORDER — POTASSIUM CHLORIDE CRYS ER 20 MEQ PO TBCR
40.0000 meq | EXTENDED_RELEASE_TABLET | Freq: Once | ORAL | Status: AC
  Administered 2018-12-06: 40 meq via ORAL
  Filled 2018-12-06: qty 2

## 2018-12-06 NOTE — Progress Notes (Signed)
Pharmacy Antibiotic Note  Beth [name] is a 72 y.o. female admitted on 12/03/2018 with infected right knee s/p TKA.  Pharmacy has been consulted for cefepime dosing for recent pseudomonal infection.  12/06/2018 Scr 2.02, CrCl ~ 28.52mls/min WBC 5.6 afebrile  Plan: Continue Cefepime 2 gm I V q24h F/u scr/cultures  Height: 5\' 5"  (165.1 cm) Weight: 221 lb 5.5 oz (100.4 kg) IBW/kg (Calculated) : 57  Temp (24hrs), Avg:99 F (37.2 C), Min:98.4 F (36.9 C), Max:99.8 F (37.7 C)  Recent Labs  Lab 12/03/18 1955 12/04/18 0404 12/05/18 0437 12/06/18 0438  WBC 6.7 5.5 5.2 5.6  CREATININE 2.54* 2.41* 2.09* 2.02*    Estimated Creatinine Clearance: 28.7 mL/min (A) (by C-G formula based on SCr of 2.02 mg/dL (H)).    Allergies  Allergen Reactions  . Aleve [Naproxen Sodium] Other (See Comments)    Heart races  . Asa [Aspirin] Other (See Comments)    Nose bleeding  . Clonidine Derivatives Other (See Comments)    Dizziness and weakness  . Shellfish Allergy Nausea And Vomiting    Antimicrobials this admission: 12/27 cefepime >>    >>   Dose adjustments this admission:   Microbiology results:  BCx:   UCx:    Sputum:   MRSA PCR:   Thank you for allowing pharmacy to be a part of this patient's care.  Dolly Rias RPh 12/06/2018, 1:26 PM Pager [phone]

## 2018-12-06 NOTE — Care Management Obs Status (Signed)
[place] NOTIFICATION   Patient Details  Name: Beth [name] MRN: [id number] Date of Birth: 1945/12/14   Medicare Observation Status Notification Given:  Yes    Guadalupe Maple, RN 12/06/2018, 1:31 PM

## 2018-12-06 NOTE — Progress Notes (Signed)
Occupational Therapy Treatment Patient Details Name: Beth [name] MRN: [id number] DOB: Jul 02, 1946 Today's Date: 12/06/2018    History of present illness 72 year old female admitted for acute kidney injury, RUE swelling.  PMHx:  R knee spacer placed on 11/03/18,  h/o DM, HTN, CVA and multiple knee sxs   OT comments  Pt states she has not "really walked in years and usually takes a few steps to the wc or recliner. Family assist with LB ADL at baseline. Pt required max A +2 with sit - stand on initial try and was not able to sustain standing position for more than 10 seconds before feeling that L knee was "buckling". On subsequent trials, pt pushing up on RW with mod A + 2 and able to pivot to chair with uncontrolled descent. Pt frustrated with her increased need for assistance. Feel pt would benefit from rehab at SNF, however pt states she wants to DC home. Family will need to be able to assist pt physically at current level. Given increased level of assistance needed and high fall risk, recommend use of Hoyer. Will follow acutely and update DC recommendations as appropriate.  Keep RUE elevated and encourage frequent P/A/AAROM ROM.   Follow Up Recommendations  SNF vs Home health OT;Supervision/Assistance - 24 hour(pending ability of family to help)    Equipment Recommendations  Other (comment);Wheelchair cushion (measurements OT)(hoyer lift)  Pt would benefit from wider wc with wc cushion   Recommendations for Other Services      Precautions / Restrictions Precautions Precautions: Fall Precaution Comments: R hand swollen/painful Required Braces or Orthoses: Knee Immobilizer - Right Knee Immobilizer - Right: On at all times Restrictions Other Position/Activity Restrictions: WBAT       Mobility Bed Mobility Overal bed mobility: Needs Assistance Bed Mobility: Supine to Sit     Supine to sit: Min assist(for RLE)        Transfers Overall transfer level: Needs  assistance Equipment used: Rolling walker (2 wheeled) Transfers: Sit to/from Stand Sit to Stand: Mod assist;+2 physical assistance Stand pivot transfers: Mod assist;+2 physical assistance       General transfer comment: poor control of descent to chair    Balance Overall balance assessment: Needs assistance   Sitting balance-[name]: Fair Sitting balance - Comments: posterior lean at times     Standing balance-[name]: Poor Standing balance comment: BUE suupport required                           ADL either performed or assessed with clinical judgement   ADL Overall ADL's : Needs assistance/impaired     Grooming: Minimal assistance   Upper Body Bathing: Minimal assistance;Sitting                   Toileting- Clothing Manipulation and Hygiene: Maximal assistance Toileting - Clothing Manipulation Details (indicate cue type and reason): use of female urinal sitting EOB     Functional mobility during ADLs: Moderate assistance;+2 for physical assistance;Cueing for safety;Rolling walker General ADL Comments: Pt pushes up on RW form elevated bed height. Unable to pushup from lower height; difficulty achieving full upright osture attempted lateral scoot from bed to chair - pt unable to scoot; staes family assists with LB ADL     Vision       Perception     Praxis      Cognition Arousal/Alertness: Awake/alert Behavior During Therapy: WFL for tasks assessed/performed Overall Cognitive Status: Within  Functional Limits for tasks assessed                                          Exercises Exercises: Other exercises;Hand exercises Hand Exercises Wrist Flexion: AROM;Right;10 reps Wrist Extension: AROM;Right;10 reps Digit Composite Flexion: AROM;AAROM;PROM;Right;15 reps Composite Extension: AROM;Right;10 reps Other Exercises Other Exercises: retrograde massage R hand Other Exercises: contract relax R hand to increase ROM    Shoulder Instructions       General Comments      Pertinent Vitals/ Pain       Pain Assessment: 0-10 Pain Score: 5  Pain Location: R hand more than knee Pain Descriptors / Indicators: Discomfort;Guarding;Grimacing Pain Intervention(s): Limited activity within patient's tolerance;Repositioned  Home Living                                          Prior Functioning/Environment              Frequency  Min 3X/week        Progress Toward Goals  OT Goals(current goals can now be found in the care plan section)  Progress towards OT goals: Progressing toward goals  Acute Rehab OT Goals Patient Stated Goal: "to go home" OT Goal Formulation: With patient Time For Goal Achievement: 12/18/18 Potential to Achieve Goals: Good ADL Goals Pt Will Perform Lower Body Dressing: sit to/from stand;with min assist;with adaptive equipment Pt Will Transfer to Toilet: with min assist;bedside commode;stand pivot transfer Pt Will Perform Toileting - Clothing Manipulation and hygiene: with min assist;sit to/from stand Additional ADL Goal #1: pt will follow through with RUE positioning and ROM for edema management Additional ADL Goal #2: pt will go from sit to stand with min A, maintaining with min guard for 2 minutes for adls  Plan Discharge plan remains appropriate;Frequency needs to be updated    Co-evaluation    PT/OT/SLP Co-Evaluation/Treatment: Yes Reason for Co-Treatment: For patient/therapist safety;To address functional/ADL transfers   OT goals addressed during session: ADL's and self-care;Strengthening/ROM      AM-PAC OT "6 Clicks" Daily Activity     Outcome Measure   Help from another person eating meals?: A Little Help from another person taking care of personal grooming?: A Little Help from another person toileting, which includes using toliet, bedpan, or urinal?: A Lot Help from another person bathing (including washing, rinsing, drying)?: A Lot Help  from another person to put on and taking off regular upper body clothing?: A Lot Help from another person to put on and taking off regular lower body clothing?: Total 6 Click Score: 13    End of Session Equipment Utilized During Treatment: Right knee immobilizer;Gait belt;Rolling walker  OT Visit Diagnosis: Pain;Unsteadiness on feet (R26.81);Muscle weakness (generalized) (M62.81) Pain - Right/Left: Right Pain - part of body: Arm;Knee   Activity Tolerance Patient tolerated treatment well   Patient Left in chair;with call bell/phone within reach;with chair alarm set   Nurse Communication Mobility status;Need for lift equipment(Maximove)        Time: 1050-1140 OT Time Calculation (min): 50 min  Charges: OT General Charges $OT Visit: 1 Visit OT Treatments $Self Care/Home Management : 38-52 mins  Maurie Boettcher, OT/L   Acute OT Clinical Specialist [place] Pager [phone] Office [phone]    [facility] 12/06/2018, 12:43 PM

## 2018-12-06 NOTE — Plan of Care (Signed)

## 2018-12-06 NOTE — Telephone Encounter (Signed)
Kidneys have been fine until this illness.

## 2018-12-06 NOTE — Progress Notes (Addendum)
PT Cancellation Note  Patient Details Name: Beth [name] MRN: [id number] DOB: May 17, 1946   Cancelled Treatment:     Pt seen this am with OT and was able to get OOB + 2 assist.  Checked on pt this afternoon, back in bed by NT and PT.     Rica Koyanagi  PTA Acute  Rehabilitation Services Pager      [phone] Office      [phone]

## 2018-12-06 NOTE — Telephone Encounter (Signed)
Pt's daughter aware of MD feedback and voiced understanding.

## 2018-12-06 NOTE — Progress Notes (Signed)
PROGRESS NOTE  Beth [name]  HKV:[id number] DOB: 05-13-1946 DOA: 12/03/2018 PCP: Claretta Fraise, MD   Brief Narrative: Beth [name] is a 72 y.o. female with a history of T2DM, HTN, HLD and recent right TKA complicated by infection requiring resection and antibiotic spacer placement 11/03/2018 and IV antibiotics via RUE PICC who presented to the ED 12/27 with right arm swelling and pain. She reported discharge from [facility] 12/23 after which she had no home health care to administer antibiotics or maintain/flush PICC line. On arrival she was hypertensive, afebrile, with creatinine up to 2.54 from normal baseline. PICC was removed, though RUE doppler was negative for DVT. ID was consulted and recommended continuing IV cefepime or ceftazidime while admitted and completing antibiotic course with oral ciprofloxacin. IV fluids were given with mild improvement in renal function. Anemia worsened with hemodilution for which 1u PRBCs given.  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  - Needs ID follow up  in 7 days from discharge.  AKI: Suspect dehydration, ARB, metformin contributing. Slight improvement thus far, but not what would be expected with normal creatinine baseline and normal renal U/S.  - Continue IV fluids and monitor BMP in AM - Hold offending agents  Right arm swelling associated with PICC line which was unaccessed for several days. No DVT on [place].  - PICC pulled. - Elevate above level of the heart, continue compression  T2DM:  - SSI while inpatient.  - Consider  restart metformin eventually, but not while renal impairment continues  HTN: Not controlled.  - Restarted home norvasc, hydralazine, nebivolol. Increased hydralazine to max dose with improvement.  - Hold ACE/ARB  Anemia, possibly iron deficiency anemia with microcytosis, worsened by hemodilution: s/p 1u PRBCs 12/29 with no evidence of bleeding.  - Monitor CBC in AM - Check anemia panel (will be post-transfusion specimen)  HLD:  - Continue statin  Hypokalemia:  - Replace today and recheck with Mg in AM  Morbid obesity: BMI 37.  - High protein diet recommended  DVT prophylaxis: Heparin Code Status: Full Family Communication: None at bedside Disposition Plan: Pt insisting on discharge to home despite recommendations for SNF at discharge. Renal function remains grossly abnormal with plans to renally dose oral cipro at discharge, it is unclear whether this will remain the correct dose based on evolution of renal function. This could be monitored more closely at SNF, but is not likely to get checked regularly with patient going home with such limited mobility (PT recommending hoyer lift). Will continue IV fluids and IV antibiotics for now.  Consultants:   ID  Procedures:   None  Antimicrobials:  Cefepime 12/27 >>   Subjective: No new complaints. Right arm swelling is painful, unchanged, not elevating it all the time. Frustrated with how weak she's grown.  Objective: Vitals:   12/06/18 1311 12/06/18 1709 12/06/18 1715 12/06/18 1906  BP: (!) 199/92 (!) 201/86  (!) 147/62  Pulse: 79   75  Resp: 16     Temp: 99.1 F (37.3 C)     TempSrc:  SpO2: 97%   95%  Weight:   101 kg   Height:        Intake/Output Summary (Last 24 hours) at 12/06/2018 1916 Last data filed at 12/06/2018 1717 Gross per 24 hour  Intake 2159.78 ml  Output 2200 ml  Net -40.22 ml   Filed Weights   12/05/18 0501 12/06/18 0453 12/06/18 1715  Weight: 96.9 kg 100.4 kg 101 kg    Gen: 72 y.o.  female in no distress  Pulm: Non-labored breathing room air. Clear to auscultation bilaterally.  CV: Regular rate and rhythm. No murmur, rub, or gallop. No JVD, Trace pedal edema. GI: Abdomen soft, non-tender, non-distended, with normoactive bowel sounds. No organomegaly or masses felt. Ext: Warm, no deformities. Right knee brace in place, decreased ROM. Distally warm, brisk cap refill, sensation and motor function intact. Skin: No rashes, lesions or ulcers Neuro: Alert and oriented. No focal neurological deficits. Psych: Judgement and insight appear normal. Mood & affect appropriate.   Data Reviewed: I have personally reviewed following labs and imaging studies  CBC: Recent Labs  Lab 12/03/18 1955 12/04/18 0404 12/05/18 0437 12/06/18 0438  WBC 6.7 5.5 5.2 5.6  HGB 9.2* 8.7* 7.5* 9.2*  HCT 31.3* 29.6* 25.8* 30.9*  MCV 84.1 82.7 85.1 83.1  PLT 267 253 224 417   Basic Metabolic Panel: Recent Labs  Lab 12/03/18 1955 12/04/18 0404 12/05/18 0437 12/06/18 0438  NA 139 138 139 141  K 3.7 3.2* 3.3* 3.2*  CL 107 108 110 111  CO2 23 19* 20* 20*  GLUCOSE 147* 135* 119* 119*  BUN 48* 43* 38* 35*  CREATININE 2.54* 2.41* 2.09* 2.02*  CALCIUM 9.0 8.5* 8.2* 8.4*   GFR: Estimated Creatinine Clearance: 28.8 mL/min (A) (by C-G formula based on SCr of 2.02 mg/dL (H)). Liver Function Tests: Recent Labs  Lab 12/03/18 1955  AST 13*  ALT 10  ALKPHOS 109  BILITOT 1.1  PROT 7.6  ALBUMIN 3.1*   No results for input(s): LIPASE, AMYLASE in the last 168 hours. No results for input(s): AMMONIA in the last 168 hours. Coagulation Profile: No results for input(s): INR, PROTIME in the last 168 hours. Cardiac Enzymes: No results for input(s): CKTOTAL, CKMB, CKMBINDEX, TROPONINI in the last 168 hours. BNP (last 3 results) No results for input(s): PROBNP in the last 8760 hours. HbA1C: No results for input(s): HGBA1C in the last 72 hours. CBG: Recent Labs  Lab 12/05/18 1720 12/05/18 2220  12/06/18 0728 12/06/18 1214 12/06/18 1712  GLUCAP 156* 180* 124* 127* 136*   Lipid Profile: No results for input(s): CHOL, HDL, LDLCALC, TRIG, CHOLHDL, LDLDIRECT in the last 72 hours. Thyroid Function Tests: No results for input(s): TSH, T4TOTAL, FREET4, T3FREE, THYROIDAB in the last 72 hours. Anemia Panel: No results for input(s): VITAMINB12, FOLATE, FERRITIN, TIBC, IRON, RETICCTPCT in the last 72 hours. Urine analysis:    Component Value Date/Time   COLORURINE STRAW (A) 12/04/2018 0124   APPEARANCEUR CLEAR 12/04/2018 0124   APPEARANCEUR Clear 09/28/2018 1628   LABSPEC 1.008 12/04/2018 0124   PHURINE 5.0 12/04/2018 0124   GLUCOSEU NEGATIVE 12/04/2018 0124   HGBUR SMALL (A) 12/04/2018 0124   BILIRUBINUR NEGATIVE 12/04/2018 0124   BILIRUBINUR Negative 09/28/2018 1628   KETONESUR NEGATIVE 12/04/2018 0124   PROTEINUR 30 (A) 12/04/2018 0124   UROBILINOGEN 1.0 04/30/2015 1400   NITRITE NEGATIVE 12/04/2018 0124   LEUKOCYTESUR NEGATIVE 12/04/2018 0124   LEUKOCYTESUR Negative 09/28/2018 1628   No results found for this or any previous visit (from  the past 240 hour(s)).    Radiology Studies: No results found.  Scheduled Meds: . amLODipine  10 mg Oral QPM  . atorvastatin  20 mg Oral QPM  . heparin  5,000 Units Subcutaneous Q8H  . hydrALAZINE  100 mg Oral Q6H  . insulin aspart  0-5 Units Subcutaneous QHS  . insulin aspart  0-9 Units Subcutaneous TID WC  . nebivolol  20 mg Oral Daily   Continuous Infusions: . sodium chloride 100 mL/hr at 12/06/18 1309  . ceFEPime (MAXIPIME) IV 2 g (12/06/18 0554)     LOS: 0 days   Time spent: 25 minutes.  Patrecia Pour, MD [facility] [email or web address] Password [email or web address] 12/06/2018, 7:16 PM

## 2018-12-06 NOTE — Telephone Encounter (Signed)
Called patient and VM is full so I could not leave a message

## 2018-12-07 LAB — IRON AND TIBC
Iron: 14 ug/dL — ABNORMAL LOW (ref 28–170)
Saturation Ratios: 8 % — ABNORMAL LOW (ref 10.4–31.8)
TIBC: 176 ug/dL — ABNORMAL LOW (ref 250–450)
UIBC: 162 ug/dL

## 2018-12-07 LAB — BASIC METABOLIC PANEL
Anion gap: 10 (ref 5–15)
BUN: 37 mg/dL — ABNORMAL HIGH (ref 8–23)
CO2: 20 mmol/L — ABNORMAL LOW (ref 22–32)
Calcium: 8.4 mg/dL — ABNORMAL LOW (ref 8.9–10.3)
Chloride: 109 mmol/L (ref 98–111)
Creatinine, Ser: 1.94 mg/dL — ABNORMAL HIGH (ref 0.44–1.00)
GFR calc Af Amer: 29 mL/min — ABNORMAL LOW (ref >60)
GFR calc non Af Amer: 25 mL/min — ABNORMAL LOW (ref >60)
GLUCOSE: 116 mg/dL — AB (ref 70–99)
Potassium: 3.4 mmol/L — ABNORMAL LOW (ref 3.5–5.1)
Sodium: 139 mmol/L (ref 135–145)

## 2018-12-07 LAB — CBC
HCT: 30.1 % — ABNORMAL LOW (ref 36.0–46.0)
Hemoglobin: 9 g/dL — ABNORMAL LOW (ref 12.0–15.0)
MCH: 24.7 pg — ABNORMAL LOW (ref 26.0–34.0)
MCHC: 29.9 g/dL — ABNORMAL LOW (ref 30.0–36.0)
MCV: 82.7 fL (ref 80.0–100.0)
Platelets: 267 10*3/uL (ref 150–400)
RBC: 3.64 MIL/uL — ABNORMAL LOW (ref 3.87–5.11)
RDW: 16.5 % — ABNORMAL HIGH (ref 11.5–15.5)
WBC: 5.8 10*3/uL (ref 4.0–10.5)
nRBC: 0 % (ref 0.0–0.2)

## 2018-12-07 LAB — VITAMIN B12: Vitamin B-12: 366 pg/mL (ref 180–914)

## 2018-12-07 LAB — GLUCOSE, CAPILLARY
Glucose-Capillary: 130 mg/dL — ABNORMAL HIGH (ref 70–99)
Glucose-Capillary: 211 mg/dL — ABNORMAL HIGH (ref 70–99)
Glucose-Capillary: 97 mg/dL (ref 70–99)
Glucose-Capillary: 141 mg/dL — ABNORMAL HIGH (ref 70–99)

## 2018-12-07 LAB — FERRITIN: FERRITIN: 169 ng/mL (ref 11–307)

## 2018-12-07 LAB — MAGNESIUM: Magnesium: 1.8 mg/dL (ref 1.7–2.4)

## 2018-12-07 MED ORDER — POTASSIUM CHLORIDE CRYS ER 20 MEQ PO TBCR
20.0000 meq | EXTENDED_RELEASE_TABLET | Freq: Once | ORAL | Status: AC
  Administered 2018-12-07: 20 meq via ORAL
  Filled 2018-12-07: qty 1

## 2018-12-07 MED ORDER — ISOSORBIDE DINITRATE 10 MG PO TABS
10.0000 mg | ORAL_TABLET | Freq: Three times a day (TID) | ORAL | Status: DC
  Administered 2018-12-07 – 2018-12-09 (×6): 10 mg via ORAL
  Filled 2018-12-07 (×7): qty 1

## 2018-12-07 NOTE — NC FL2 (Signed)
[place] LEVEL OF CARE SCREENING TOOL     IDENTIFICATION  Patient Name: Beth [name] Birthdate: [birth year] Sex: female Admission Date (Current Location): 12/03/2018  [facility] and [place] Number:  Herbalist and Address:  [facility],  [place], [place]      Provider Number: [id number]  Attending Physician Name and Address:  Patrecia Pour, MD  Relative Name and Phone Number:  [place]: [phone]    Current Level of Care: Hospital Recommended Level of Care: [place] Prior Approval Number:    Date Approved/Denied:   PASRR Number: [id number]  Discharge Plan: SNF    Current Diagnoses: Patient Active Problem List   Diagnosis Date Noted  . AKI (acute kidney injury) ([place]) 12/06/2018  . Acute kidney injury ([place]) 12/03/2018  . S/P PICC central line placement 12/03/2018  . Septic arthritis of knee, right ([place]) 11/03/2018  . Steroid-induced hyperglycemia   . Infection of total right knee replacement ([place]) 08/06/2018  . Cellulitis and abscess of leg 01/14/2017  . Abscess of right leg 01/14/2017  . Diabetes type 2, controlled ([place]) 03/07/2016  . Gout   . Essential hypertension   . Enuresis 11/19/2015  . HLD (hyperlipidemia) 01/04/2014  . Anemia, iron deficiency 12/30/2011  . Constipation 04/04/2009  . TUBULOVILLOUS ADENOMA, COLON 04/03/2009    Orientation RESPIRATION BLADDER Height & Weight     Self, Time, Situation, Place  Normal Continent, External catheter Weight: 219 lb 9.3 oz (99.6 kg) Height:  5\' 5"  (165.1 cm)  BEHAVIORAL SYMPTOMS/MOOD NEUROLOGICAL BOWEL NUTRITION STATUS      Continent Diet Heart Healthy carb modified.   AMBULATORY STATUS COMMUNICATION OF NEEDS Skin   Extensive Assist Verbally Surgical wounds                       Personal Care Assistance Level of Assistance  Bathing, Feeding, Dressing Bathing Assistance: Limited assistance Feeding assistance: Independent Dressing  Assistance: Limited assistance     Functional Limitations Info  Sight, Hearing, Speech Sight Info: Adequate Hearing Info: Adequate Speech Info: Adequate    SPECIAL CARE FACTORS FREQUENCY  PT (By licensed PT), OT (By licensed OT)     PT Frequency: 5X/week  OT Frequency: 5X/week            Contractures Contractures Info: Not present    Additional Factors Info  Code Status, Allergies Code Status Info: Fullcode  Allergies Info: Allergies: Aleve Naproxen Sodium, Asa Aspirin, Clonidine Derivatives, Shellfish Allergy           Current Medications (12/07/2018):  This is the current hospital active medication list Current Facility-Administered Medications  Medication Dose Route Frequency Provider Last Rate Last Dose  . 0.9 %  sodium chloride infusion   Intravenous Continuous Georgette Shell, MD   Stopped at 12/07/18 [id number]  . acetaminophen (TYLENOL) tablet 650 mg  650 mg Oral Q6H PRN Lenore Cordia, MD   650 mg at 12/04/18 0757   Or  . acetaminophen (TYLENOL) suppository 650 mg  650 mg Rectal Q6H PRN Zada Finders R, MD      . amLODipine (NORVASC) tablet 10 mg  10 mg Oral QPM Lenore Cordia, MD   10 mg at 12/06/18 1709  . atorvastatin (LIPITOR) tablet 20 mg  20 mg Oral QPM Zada Finders R, MD   20 mg at 12/06/18 1709  . ceFEPIme (MAXIPIME) 2 g in sodium chloride 0.9 % 100 mL IVPB  2  g Intravenous Q0600 Dorrene German, RPH 100 mL/hr at 12/07/18 0659 2 g at 12/07/18 0659  . diphenhydrAMINE (BENADRYL) capsule 25 mg  25 mg Oral Q6H PRN Georgette Shell, MD   25 mg at 12/06/18 2352  . heparin injection 5,000 Units  5,000 Units Subcutaneous Q8H Lenore Cordia, MD   5,000 Units at 12/07/18 0700  . hydrALAZINE (APRESOLINE) injection 10 mg  10 mg Intravenous Q6H PRN Bodenheimer, Charles A, NP   10 mg at 12/06/18 1314  . hydrALAZINE (APRESOLINE) tablet 100 mg  100 mg Oral Q6H Georgette Shell, MD   100 mg at 12/07/18 0700  . insulin aspart (novoLOG) injection 0-5 Units   0-5 Units Subcutaneous QHS Patel, Vishal R, MD      . insulin aspart (novoLOG) injection 0-9 Units  0-9 Units Subcutaneous TID WC Lenore Cordia, MD   1 Units at 12/07/18 0809  . lip balm (CARMEX) ointment   Topical PRN Zada Finders R, MD      . nebivolol (BYSTOLIC) tablet 20 mg  20 mg Oral Daily Zada Finders R, MD   20 mg at 12/07/18 1049  . ondansetron (ZOFRAN) tablet 4 mg  4 mg Oral Q6H PRN Lenore Cordia, MD       Or  . ondansetron (ZOFRAN) injection 4 mg  4 mg Intravenous Q6H PRN Lenore Cordia, MD   4 mg at 12/06/18 1542  . oxyCODONE (Oxy IR/ROXICODONE) immediate release tablet 5 mg  5 mg Oral Q4H PRN Lenore Cordia, MD   5 mg at 12/06/18 0908   Facility-Administered Medications Ordered in Other Encounters  Medication Dose Route Frequency Provider Last Rate Last Dose  . tranexamic acid (CYKLOKAPRON) 2,000 mg in sodium chloride 0.9 % 50 mL Topical Application  1,610 mg Topical Once [name], MD         Discharge Medications: Please see discharge summary for a list of discharge medications.  Relevant Imaging Results:  Relevant Lab Results:   Additional Information SSN: [id number]  Lia Hopping, LCSW

## 2018-12-07 NOTE — Progress Notes (Signed)
Physical Therapy Treatment Patient Details Name: Beth [name] MRN: [id number] DOB: 10-01-1946 Today's Date: 12/07/2018    History of Present Illness 72 year old female admitted for acute kidney injury, RUE swelling.  PMHx:  R knee spacer placed on 11/03/18,  h/o DM, HTN, CVA and multiple knee sxs    PT Comments    Assisted OOB to [facility] required + 2 assist.  General transfer comment: assisted from elevated bed to [facility] then off BSC + 2 side by side assist with 255 VC's on proper hand placement esp with sit to stand  General Gait Details: + 2 side by side assist pt was able to take several short steps forward while pulling recliner behind.  Follow Up Recommendations  SNF     Equipment Recommendations  None recommended by PT    Recommendations for Other Services       Precautions / Restrictions Precautions Precautions: Fall Required Braces or Orthoses: Knee Immobilizer - Right Knee Immobilizer - Right: On at all times Restrictions Weight Bearing Restrictions: No Other Position/Activity Restrictions: WBAT    Mobility  Bed Mobility Overal bed mobility: Needs Assistance Bed Mobility: Supine to Sit     Supine to sit: Min assist     General bed mobility comments: cues to self assist, incr time, assist with RLE  Transfers Overall transfer level: Needs assistance Equipment used: Rolling walker (2 wheeled) Transfers: Sit to/from [facility] Sit to Stand: Mod assist;+2 physical assistance Stand pivot transfers: Mod assist;+2 physical assistance       General transfer comment: assisted from elevated bed to [facility] then off BSC + 2 side by side assist with 255 VC's on proper hand placement esp with sit to stand   Ambulation/Gait Ambulation/Gait assistance: Mod assist;+2 physical assistance;+2 safety/equipment Gait Distance (Feet): 3 Feet Assistive device: Rolling walker (2 wheeled) Gait Pattern/deviations: Step-to pattern;Decreased stride length;Wide base of  support;Trunk flexed Gait velocity: decreased    General Gait Details: + 2 side by side assist pt was able to take several short steps forward while pulling recliner behind   Stairs             Wheelchair Mobility    Modified Rankin (Stroke Patients Only)       Balance                                            Cognition Arousal/Alertness: Awake/alert Behavior During Therapy: WFL for tasks assessed/performed Overall Cognitive Status: Within Functional Limits for tasks assessed                                        Exercises      General Comments        Pertinent Vitals/Pain Pain Assessment: Faces Faces Pain Scale: Hurts a little bit Pain Location: R hand more than knee Pain Descriptors / Indicators: Discomfort;Guarding;Grimacing Pain Intervention(s): Monitored during session    Home Living                      Prior Function            PT Goals (current goals can now be found in the care plan section) Progress towards PT goals: Progressing toward goals    Frequency    Min 3X/week  PT Plan Discharge plan needs to be updated;Frequency needs to be updated    Co-evaluation              AM-PAC PT "6 Clicks" Mobility   Outcome Measure  Help needed turning from your back to your side while in a flat bed without using bedrails?: A Lot Help needed moving from lying on your back to sitting on the side of a flat bed without using bedrails?: A Lot Help needed moving to and from a bed to a chair (including a wheelchair)?: A Lot Help needed standing up from a chair using your arms (e.g., wheelchair or bedside chair)?: A Lot Help needed to walk in hospital room?: Total Help needed climbing 3-5 steps with a railing? : Total 6 Click Score: 10    End of Session Equipment Utilized During Treatment: Gait belt;Right knee immobilizer Activity Tolerance: Patient tolerated treatment well Patient left: in  chair Nurse Communication: Mobility status PT Visit Diagnosis: Difficulty in walking, not elsewhere classified (R26.2);Pain Pain - Right/Left: Right Pain - part of body: Knee;Hand     Time: 2174-7159 PT Time Calculation (min) (ACUTE ONLY): 30 min  Charges:  $Gait Training: 8-22 mins $Therapeutic Activity: 8-22 mins                     Rica Koyanagi  PTA Acute  Rehabilitation Services Pager      [phone] Office      [phone]

## 2018-12-07 NOTE — Progress Notes (Signed)
Occupational Therapy Treatment Patient Details Name: Beth [name] MRN: [id number] DOB: [birth year] Today's Date: 12/07/2018    History of present illness 72 year old female admitted for acute kidney injury, RUE swelling.  PMHx:  R knee spacer placed on 11/03/18,  h/o DM, HTN, CVA and multiple knee sxs   OT comments  Focus on RUE edema, positioning. Pt not able to use RUE functionally with built up handles.  L forearm now with edema. Sign left for positioning. Pt needs reinforcement.  Performed A/AAROM, retrograde massage also   Follow Up Recommendations  SNF    Equipment Recommendations  (if home, w/c and mechanical lift)    Recommendations for Other Services      Precautions / Restrictions Precautions Precautions: Fall Precaution Comments: R hand swollen/painful Required Braces or Orthoses: Knee Immobilizer - Right Knee Immobilizer - Right: On at all times Restrictions Weight Bearing Restrictions: No Other Position/Activity Restrictions: WBAT       Mobility Bed Mobility              Transfers                Balance                                           ADL either performed or assessed with clinical judgement   ADL                                               Vision       Perception     Praxis      Cognition Arousal/Alertness: Awake/alert Behavior During Therapy: WFL for tasks assessed/performed Overall Cognitive Status: Within Functional Limits for tasks assessed                                          Exercises Hand Exercises Digit Composite Flexion: AROM;AAROM;PROM;Right;15 reps Composite Extension: AROM;Right;10 reps Other Exercises Other Exercises: retrograde massage R hand   Shoulder Instructions       General Comments pt up in chair and wanted to remain up for a couple more hours.  Re-educated on positioning and worked on this.  Pt now with edema in L forearm but  not hand. Posted positioning hand in room.  When OT went out of room, pt had moved hand off of incline.  Reminded her that this and moving fingers will help edema.  Issued squeeze ball to gently squeeze. She cannot yet use foam to build up handles for utensils or toothbrush    Pertinent Vitals/ Pain       Pain Assessment: Faces Pain Score: 5  Faces Pain Scale: Hurts a little bit Pain Location: R hand Pain Descriptors / Indicators: Discomfort Pain Intervention(s): Limited activity within patient's tolerance;Monitored during session;Premedicated before session;Repositioned  Home Living                                          Prior Functioning/Environment              Frequency  Min 2X/week  Progress Toward Goals  OT Goals(current goals can now be found in the care plan section)  Progress towards OT goals: Progressing toward goals     Plan Discharge plan needs to be updated    Co-evaluation                 AM-PAC OT "6 Clicks" Daily Activity     Outcome Measure   Help from another person eating meals?: A Little Help from another person taking care of personal grooming?: A Little Help from another person toileting, which includes using toliet, bedpan, or urinal?: A Lot Help from another person bathing (including washing, rinsing, drying)?: A Lot Help from another person to put on and taking off regular upper body clothing?: A Lot Help from another person to put on and taking off regular lower body clothing?: Total 6 Click Score: 13    End of Session    OT Visit Diagnosis: Pain;Unsteadiness on feet (R26.81);Muscle weakness (generalized) (M62.81) Pain - Right/Left: Right Pain - part of body: Arm;Knee   Activity Tolerance Patient tolerated treatment well   Patient Left in chair;with call bell/phone within reach;with chair alarm set   Nurse Communication          Time: 8563-1497 OT Time Calculation (min): 20 min  Charges: OT  General Charges $OT Visit: 1 Visit OT Treatments $Therapeutic Activity: 8-22 mins  Lesle Chris, OTR/L Acute Rehabilitation Services [phone] [facility] pager [phone] office 12/07/2018   Dowell Hoon 12/07/2018, 3:41 PM

## 2018-12-07 NOTE — Clinical Social Work Note (Addendum)
The assessment below was completed on 12/01 and the patient overall condition is still the same. The patient will need SNF placement at discharge.   12/31 Update: CSW discussed discharge planning with the patient and her son. Patient prefers to go home. Per patient son, he does not feel that it will be safe for the patient to return home. He reports she has limited support and will need help her ADL's at home. He reports the patient was recently discharged to [facility] for physical therapy and transitioned home with home health services. The patient reports [facility] visited her home on Christmas Day and did not return. The patient reports she did not receive her antibiotics and started to have arm swelling.  Patient reports she will consider SNF again but is concern about her co-pay and accumulating a bill. Per SNF, the patient co-pay will be 161.60 per day.The patient son reports the family will arrange to pay the bill.    CSW provided patient and e-mail son a SNF list. CSW will further discuss SNF options with patient and son.  Patient will need insurance authorization prior to discharge. Patient and son reports understanding.     Clinical Social Work Assessment  Patient Details  Name: Beth [name] MRN: [id number] Date of Birth: 1946-11-05  Date of referral:                  Reason for consult:                   Permission sought to share information with:    Permission granted to share information::     Name::        Agency::     Relationship::     Contact Information:     Housing/Transportation Living arrangements for the past 2 months:    Source of Information:    Patient Interpreter Needed:    Criminal Activity/Legal Involvement Pertinent to Current Situation/Hospitalization:    Significant Relationships:    Lives with:    Do you feel safe going back to the place where you live?    Need for family participation in patient care:     Care giving concerns:  Patient currently  lives with granddaughter who works third shift and is unable to provide support to patient during the day due to needing to sleep for work. Patient has nearby supportive family but is typically alone much of the day. PT recommending short term therapy to regain strength before returning home.    Social Worker assessment / plan:  CSW met with patient at bedside to discuss discharge plan and SNF referral process. Patient states she lives at home with her granddaughter who works third shift. She has multiple supportive children who live nearby.  She was previously admitted 9/19 and received therapy at [facility]. Patient reports she had good experience there and prefers to return. If unable to go to [facility], patient's second choice is [facility]. She prefers a facility close to her home in [place].   CSW explained [facility] insurance auth process and answered questions regarding auth. Completed FL2 and faxed out to facilities. Will f/u with bed offers.   Employment status:    Insurance information:    PT Recommendations:    Information / Referral to community resources:     Patient/Family's Response to care:  Patient is willing to go to SNF and recognizes need for short term therapy due to lack of available family  support during the day. Appreciative of CSW involvement and questions answered.  Patient/Family's Understanding of and Emotional Response to Diagnosis, Current Treatment, and Prognosis:  Patient understands SNF process and PT recommendation. She has previously been to [facility] and knows what to expect.  Emotional Assessment Appearance:    Attitude/Demeanor/Rapport:    Affect (typically observed):    Orientation:    Alcohol / Substance use:    Psych involvement (Current and /or in the community):     Discharge Needs  Concerns to be addressed:    Readmission within the last 30 days:    Current discharge risk:    Barriers to Discharge:      Lia Hopping, [place] 12/07/2018, 9:48 AM

## 2018-12-07 NOTE — Progress Notes (Signed)
PROGRESS NOTE  Beth [name]  TGG:[id number] DOB: 09/11/1946 DOA: 12/03/2018 PCP: Claretta Fraise, MD   Brief Narrative: Beth [name] is a 72 y.o. female with a history of T2DM, HTN, HLD and recent right TKA complicated by infection requiring resection and antibiotic spacer placement 11/03/2018 and IV antibiotics via RUE PICC who presented to the ED 12/27 with right arm swelling and pain. She reported discharge from [facility] 12/23 after which she had no home health care to administer antibiotics or maintain/flush PICC line. On arrival she was hypertensive, afebrile, with creatinine up to 2.54 from normal baseline. PICC was removed, though RUE doppler was negative for DVT. ID was consulted and recommended continuing IV cefepime or ceftazidime while admitted and completing antibiotic course with oral ciprofloxacin. IV fluids were given with mild improvement in renal function. Anemia worsened with hemodilution for which 1u PRBCs given.  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  - Needs ID follow up  in 7 days from discharge.  AKI: Suspect dehydration, ARB, metformin contributing. Renal U/S normal. Continues slow improvement. Has normal baseline. - Continue IV fluids and monitor BMP daily - Hold offending agents  Right arm swelling associated with PICC line which was unaccessed for several days. No DVT on [place].  - PICC pulled. - Elevate above level of the heart, continue compression  Left arm swelling associated with infiltrated IV.  - Elevate, warm compresses.  T2DM:  - SSI  while inpatient.  - Consider restart metformin eventually, but not while renal impairment continues  HTN: Not controlled.  - Restarted home norvasc, hydralazine, nebivolol. Increased hydralazine to max dose, will add isosorbide. Has clonidine allergy.  - With resistant HTN and hypokalemia, consider hyperaldo work up and/or empiric spironolactone when renal function stabilizes. - Holding ACE/ARB with AKI  Anemia of chronic disease: Worsened by hemodilution: s/p 1u PRBCs 12/29 with no evidence of bleeding. Hgb stable. - Monitor CBC in AM  HLD:  - Continue statin  Hypokalemia:  - Improved with supplementation, will repeat. Mg ok  Morbid obesity: BMI 37.  - High protein diet recommended  DVT prophylaxis: Heparin Code Status: Full Family Communication: None at bedside Disposition Plan: SNF once insurance authorization is obtained.  Consultants:   ID  Procedures:   None  Antimicrobials:  Cefepime 12/27 >>   Subjective: Feels she needs to go to SNF which has been our recommendation. Left arm swelling has increased though right arm swelling is improved.  Objective: Vitals:   12/06/18 2343 12/07/18 0448 12/07/18 1316 12/07/18 1818  BP: (!) 148/74 (!) 147/82 (!) 168/87 (!) 173/99  Pulse: 71 68 71 70  Resp:  18 14 14   Temp:  98.7 F (37.1 C) 98.1 F (36.7 C) 98.2 F (36.8 C)  TempSrc:  Oral Oral Oral  SpO2: 93% 96% 97% 100%  Weight:  99.6 kg    Height:        Intake/Output Summary (Last 24 hours) at 12/07/2018 2004 Last data filed at 12/07/2018 1800 Gross per 24 hour  Intake 2691.16 ml  Output 1900 ml  Net 791.16 ml   Filed Weights   12/06/18 0453 12/06/18 1715 12/07/18 0448  Weight: 100.4 kg 101 kg 99.6 kg   Gen: 72 y.o. female in no distress Pulm: Nonlabored breathing room air. Clear. CV: Regular rate and rhythm. No murmur, rub, or gallop. No JVD, no dependent edema. GI: Abdomen soft, non-tender, non-distended, with normoactive bowel sounds.  Ext: Warm.  RLE with brace in place, no significant edema. Cap refill brisk distally. Left arm diffusely edematous with small water blisters on upper arm proximal to IV site.  Skin: No new rashes, lesions or ulcers on visualized skin.  Neuro: Alert and oriented. No focal neurological deficits. Psych: Judgement and insight appear fair. Mood euthymic & affect congruent. Behavior is appropriate.    Data Reviewed: I have personally reviewed following labs and imaging studies  CBC: Recent Labs  Lab 12/03/18 1955 12/04/18 0404 12/05/18 0437 12/06/18 0438 12/07/18 0436  WBC 6.7 5.5 5.2 5.6 5.8  HGB 9.2* 8.7* 7.5* 9.2* 9.0*  HCT 31.3* 29.6* 25.8* 30.9* 30.1*  MCV 84.1 82.7 85.1 83.1 82.7  PLT 267 253 224 257 751   Basic Metabolic Panel: Recent Labs  Lab 12/03/18 1955 12/04/18 0404 12/05/18 0437 12/06/18 0438 12/07/18 0436  NA 139 138 139 141 139  K 3.7 3.2* 3.3* 3.2* 3.4*  CL 107 108 110 111 109  CO2 23 19* 20* 20* 20*  GLUCOSE 147* 135* 119* 119* 116*  BUN 48* 43* 38* 35* 37*  CREATININE 2.54* 2.41* 2.09* 2.02* 1.94*  CALCIUM 9.0 8.5* 8.2* 8.4* 8.4*  MG  --   --   --   --  1.8   GFR: Estimated Creatinine Clearance: 29.7 mL/min (A) (by C-G formula based on SCr of 1.94 mg/dL (H)). Liver Function Tests: Recent Labs  Lab 12/03/18 1955  AST 13*  ALT 10  ALKPHOS 109  BILITOT 1.1  PROT 7.6  ALBUMIN 3.1*   Anemia Panel: Recent Labs    12/07/18 0436  VITAMINB12 366  FERRITIN 169  TIBC 176*  IRON 14*   Urine analysis:    Component Value Date/Time   COLORURINE STRAW (A) 12/04/2018 0124   APPEARANCEUR CLEAR 12/04/2018 0124   APPEARANCEUR Clear 09/28/2018 1628   LABSPEC 1.008 12/04/2018 0124   PHURINE 5.0 12/04/2018 0124   GLUCOSEU NEGATIVE 12/04/2018 0124   HGBUR SMALL (A) 12/04/2018 0124   BILIRUBINUR NEGATIVE 12/04/2018 0124   BILIRUBINUR Negative 09/28/2018 1628   KETONESUR NEGATIVE 12/04/2018 0124   PROTEINUR 30 (A) 12/04/2018 0124   UROBILINOGEN 1.0 04/30/2015 1400    NITRITE NEGATIVE 12/04/2018 0124   LEUKOCYTESUR NEGATIVE 12/04/2018 0124   LEUKOCYTESUR Negative 09/28/2018 1628   No results found for this or any previous visit (from the past 240 hour(s)).    Radiology Studies: No results found.  Scheduled Meds: . amLODipine  10 mg Oral QPM  . atorvastatin  20 mg Oral QPM  . heparin  5,000 Units Subcutaneous Q8H  . hydrALAZINE  100 mg Oral Q6H  . insulin aspart  0-5 Units Subcutaneous QHS  . insulin aspart  0-9 Units Subcutaneous TID WC  . nebivolol  20 mg Oral Daily   Continuous Infusions: . sodium chloride 100 mL/hr at 12/07/18 1800  . ceFEPime (MAXIPIME) IV Stopped (12/07/18 0729)     LOS: 1 day   Time spent: 25 minutes.  Patrecia Pour, MD [facility] [email or web address] Password [email or web address] 12/07/2018, 8:04 PM

## 2018-12-08 DIAGNOSIS — N179 Acute kidney failure, unspecified: Principal | ICD-10-CM

## 2018-12-08 DIAGNOSIS — M009 Pyogenic arthritis, unspecified: Secondary | ICD-10-CM

## 2018-12-08 DIAGNOSIS — R7989 Other specified abnormal findings of blood chemistry: Secondary | ICD-10-CM

## 2018-12-08 LAB — BASIC METABOLIC PANEL
Anion gap: 10 (ref 5–15)
BUN: 30 mg/dL — ABNORMAL HIGH (ref 8–23)
CO2: 20 mmol/L — ABNORMAL LOW (ref 22–32)
Calcium: 8.4 mg/dL — ABNORMAL LOW (ref 8.9–10.3)
Chloride: 110 mmol/L (ref 98–111)
Creatinine, Ser: 1.67 mg/dL — ABNORMAL HIGH (ref 0.44–1.00)
GFR calc non Af Amer: 30 mL/min — ABNORMAL LOW (ref >60)
GFR, EST AFRICAN AMERICAN: 35 mL/min — AB (ref >60)
Glucose, Bld: 122 mg/dL — ABNORMAL HIGH (ref 70–99)
Potassium: 3.4 mmol/L — ABNORMAL LOW (ref 3.5–5.1)
Sodium: 140 mmol/L (ref 135–145)

## 2018-12-08 LAB — CBC
HEMATOCRIT: 30.3 % — AB (ref 36.0–46.0)
Hemoglobin: 8.9 g/dL — ABNORMAL LOW (ref 12.0–15.0)
MCH: 24.5 pg — ABNORMAL LOW (ref 26.0–34.0)
MCHC: 29.4 g/dL — ABNORMAL LOW (ref 30.0–36.0)
MCV: 83.5 fL (ref 80.0–100.0)
Platelets: 276 10*3/uL (ref 150–400)
RBC: 3.63 MIL/uL — ABNORMAL LOW (ref 3.87–5.11)
RDW: 16.3 % — ABNORMAL HIGH (ref 11.5–15.5)
WBC: 6.1 10*3/uL (ref 4.0–10.5)
nRBC: 0 % (ref 0.0–0.2)

## 2018-12-08 LAB — GLUCOSE, CAPILLARY
GLUCOSE-CAPILLARY: 196 mg/dL — AB (ref 70–99)
GLUCOSE-CAPILLARY: 101 mg/dL — AB (ref 70–99)
Glucose-Capillary: 126 mg/dL — ABNORMAL HIGH (ref 70–99)
Glucose-Capillary: 130 mg/dL — ABNORMAL HIGH (ref 70–99)

## 2018-12-08 MED ORDER — DIPHENHYDRAMINE-ZINC ACETATE 2-0.1 % EX CREA
TOPICAL_CREAM | Freq: Three times a day (TID) | CUTANEOUS | Status: DC | PRN
  Administered 2018-12-08: 16:00:00 via TOPICAL
  Filled 2018-12-08: qty 28

## 2018-12-08 MED ORDER — SODIUM CHLORIDE 0.9 % IV SOLN
2.0000 g | Freq: Two times a day (BID) | INTRAVENOUS | Status: DC
  Administered 2018-12-08 – 2018-12-09 (×2): 2 g via INTRAVENOUS
  Filled 2018-12-08 (×3): qty 2

## 2018-12-08 NOTE — Plan of Care (Signed)
Pt is stable. Plan of care reviewed.

## 2018-12-08 NOTE — Progress Notes (Signed)
PROGRESS NOTE    Beth [name]  EPP:[id number] DOB: 23-Jun-1946 DOA: 12/03/2018 PCP: Claretta Fraise, MD    Brief Narrative:  73 y.o. female with a history of T2DM, HTN, HLD and recent right TKA complicated by infection requiring resection and antibiotic spacer placement 11/03/2018 and IV antibiotics via RUE PICC who presented to the ED 12/27 with right arm swelling and pain. She reported discharge from [facility] 12/23 after which she had no home health care to administer antibiotics or maintain/flush PICC line. On arrival she was hypertensive, afebrile, with creatinine up to 2.54 from normal baseline. PICC was removed, though RUE doppler was negative for DVT. ID was consulted and recommended continuing IV cefepime or ceftazidime while admitted and completing antibiotic course with oral ciprofloxacin. IV fluids were given with mild improvement in renal function. Anemia worsened with hemodilution for which 1u PRBCs given.  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  - Recommendation for ID follow up  in 7 days from discharge.  AKI: Suspect dehydration, ARB, metformin contributing. Renal U/S normal. Continues slow improvement. Has normal baseline. - Labs reviewed. Cr improving with IVF hydration - Hold nephrotoxic agents - Repeat bmet in AM  Right arm swelling associated with PICC line which was unaccessed for several days. No DVT on [place].  - PICC pulled. - Elevate above level of the heart, continue compression - Stable at present  Left arm swelling associated with  infiltrated IV.  - Elevate, warm compresses.  T2DM:  - SSI while inpatient.  - Consider restart metformin eventually, consideration for Januvia if renal function not yet at baseline  HTN: Not controlled.  - Restarted home norvasc, hydralazine, nebivolol. Continue hyydralazine with  isosorbide. Has clonidine allergy.  - With resistant HTN and hypokalemia, consider hyperaldo work up and/or empiric spironolactone when renal function stabilizes. - Holding ACE/ARB with AKI - BP stable, albeit suboptimally controlled  Anemia of chronic disease: Worsened by hemodilution: s/p 1u PRBCs 12/29 with no evidence of bleeding. Hgb stable. - repeat cbc in AM  HLD:  - Continue statin - Stable at present  Hypokalemia:  - Improved with supplementation - Repeat bmet in AM  Morbid obesity: BMI 37.  - High protein diet recommended   DVT prophylaxis: Heparin subq Code Status: Full Family Communication: Pt in room, family not at bedside Disposition Plan: SNF anticipated in 24-48hrs  Consultants:   Orthopedic surgery  Procedures:     Antimicrobials: Anti-infectives (From admission, onward)   Start     Dose/Rate Route Frequency Ordered Stop   12/08/18 2000  ceFEPIme (MAXIPIME) 2 g in sodium chloride 0.9 % 100 mL IVPB     2 g 200 mL/hr over 30 Minutes Intravenous Every 12 hours 12/08/18 1352     12/05/18 0400  ceFEPIme (MAXIPIME) 2 g in sodium chloride 0.9 % 100 mL IVPB  Status:  Discontinued     2 g 200 mL/hr over 30 Minutes Intravenous Daily 12/04/18 0223 12/08/18 1352   12/03/18 2315  ceFEPIme (MAXIPIME) 2 g in sodium chloride 0.9 % 100 mL  IVPB     2 g 200 mL/hr over 30 Minutes Intravenous  Once 12/03/18 2313 12/04/18 0247       Subjective: Feeling very weak in LE  Objective: Vitals:   12/08/18 0505 12/08/18 0933 12/08/18 1235 12/08/18 1303  BP: (!) 157/72 (!) 155/84 (!) 175/83 (!) 173/82  Pulse: 67 77 66 73  Resp: 15   16  Temp: 98.7 F (37.1 C)   (!) 97.4 F (36.3  C)  TempSrc: Oral   Oral  SpO2: 98% 100%  96%  Weight: 99.2 kg     Height:        Intake/Output Summary (Last 24 hours) at 12/08/2018 1533 Last data filed at 12/08/2018 1400 Gross per 24 hour  Intake 3098.96 ml  Output 2500 ml  Net 598.96 ml   Filed Weights   12/06/18 1715 12/07/18 0448 12/08/18 0505  Weight: 101 kg 99.6 kg 99.2 kg    Examination:  General exam: Appears calm and comfortable  Respiratory system: Clear to auscultation. Respiratory effort normal. Cardiovascular system: S1 & S2 heard, RRR Gastrointestinal system: Abdomen is nondistended, soft and nontender. No organomegaly or masses felt. Normal bowel sounds heard. Central nervous system: Alert and oriented. No focal neurological deficits. Extremities: Symmetric 5 x 5 power. Skin: No rashes, lesions Psychiatry: Judgement and insight appear normal. Mood & affect appropriate.   Data Reviewed: I have personally reviewed following labs and imaging studies  CBC: Recent Labs  Lab 12/04/18 0404 12/05/18 0437 12/06/18 0438 12/07/18 0436 12/08/18 0442  WBC 5.5 5.2 5.6 5.8 6.1  HGB 8.7* 7.5* 9.2* 9.0* 8.9*  HCT 29.6* 25.8* 30.9* 30.1* 30.3*  MCV 82.7 85.1 83.1 82.7 83.5  PLT 253 224 257 267 867   Basic Metabolic Panel: Recent Labs  Lab 12/04/18 0404 12/05/18 0437 12/06/18 0438 12/07/18 0436 12/08/18 0442  NA 138 139 141 139 140  K 3.2* 3.3* 3.2* 3.4* 3.4*  CL 108 110 111 109 110  CO2 19* 20* 20* 20* 20*  GLUCOSE 135* 119* 119* 116* 122*  BUN 43* 38* 35* 37* 30*  CREATININE 2.41* 2.09* 2.02* 1.94* 1.67*  CALCIUM 8.5* 8.2* 8.4* 8.4* 8.4*  MG  --   --   --  1.8  --    GFR: Estimated Creatinine Clearance: 34.5 mL/min (A) (by C-G formula based on SCr of 1.67 mg/dL (H)). Liver Function Tests: Recent Labs  Lab 12/03/18 1955  AST 13*  ALT 10  ALKPHOS 109  BILITOT 1.1  PROT 7.6  ALBUMIN 3.1*   No results for input(s): LIPASE, AMYLASE in the last 168 hours. No results for input(s): AMMONIA in the  last 168 hours. Coagulation Profile: No results for input(s): INR, PROTIME in the last 168 hours. Cardiac Enzymes: No results for input(s): CKTOTAL, CKMB, CKMBINDEX, TROPONINI in the last 168 hours. BNP (last 3 results) No results for input(s): PROBNP in the last 8760 hours. HbA1C: No results for input(s): HGBA1C in the last 72 hours. CBG: Recent Labs  Lab 12/07/18 1157 12/07/18 1814 12/07/18 2253 12/08/18 0720 12/08/18 1157  GLUCAP 211* 97 141* 126* 196*   Lipid Profile: No results for input(s): CHOL, HDL, LDLCALC, TRIG, CHOLHDL, LDLDIRECT in the last 72 hours. Thyroid Function Tests: No results for input(s): TSH, T4TOTAL, FREET4, T3FREE, THYROIDAB in the last 72 hours. Anemia Panel: Recent Labs    12/07/18 0436  VITAMINB12 366  FERRITIN 169  TIBC 176*  IRON 14*   Sepsis Labs: No results for input(s): PROCALCITON, LATICACIDVEN in the  last 168 hours.  No results found for this or any previous visit (from the past 240 hour(s)).   Radiology Studies: No results found.  Scheduled Meds: . amLODipine  10 mg Oral QPM  . atorvastatin  20 mg Oral QPM  . heparin  5,000 Units Subcutaneous Q8H  . hydrALAZINE  100 mg Oral Q6H  . insulin aspart  0-5 Units Subcutaneous QHS  . insulin aspart  0-9 Units Subcutaneous TID WC  . isosorbide dinitrate  10 mg Oral TID  . nebivolol  20 mg Oral Daily   Continuous Infusions: . sodium chloride 100 mL/hr at 12/08/18 1102  . ceFEPime (MAXIPIME) IV       LOS: 2 days   Marylu Lund, MD [facility] Pager On Amion  If 7PM-7AM, please contact night-coverage 12/08/2018, 3:33 PM

## 2018-12-08 NOTE — Plan of Care (Signed)
  Problem: Health Behavior/Discharge Planning: Goal: Ability to manage health-related needs will improve Outcome: Progressing   Problem: Clinical Measurements: Goal: Ability to maintain clinical measurements within normal limits will improve Outcome: Progressing Goal: Will remain free from infection Outcome: Progressing Goal: Diagnostic test results will improve Outcome: Progressing   

## 2018-12-08 NOTE — Progress Notes (Signed)
Patient and son-Jim both agreeable to [facility] for rehab. SNF started the insurance process 12/07/2018.  CSW will inform the medical team when the authorization has been received.    Kathrin Greathouse, Marlinda Mike, MSW Clinical Social Worker  [phone]

## 2018-12-08 NOTE — Progress Notes (Signed)
Pharmacy Antibiotic Note  Beth [name] is a 73 y.o. female admitted on 12/03/2018 with infected right knee s/p TKA.  Pharmacy has been consulted for cefepime dosing for recent pseudomonal infection.  12/08/2018, today  SCr 1.67 continues to trend down, CrCl ~ 34 mL/min  WBC WNL, afebrile  Day #5 of IV antibiotics  Plan:  Given improvement in renal function, increase cefepime to 2 g IV q12h  Continue to monitor renal function  Height: 5\' 5"  (165.1 cm) Weight: 218 lb 11.1 oz (99.2 kg) IBW/kg (Calculated) : 57  Temp (24hrs), Avg:98.1 F (36.7 C), Min:97.4 F (36.3 C), Max:98.7 F (37.1 C)  Recent Labs  Lab 12/04/18 0404 12/05/18 0437 12/06/18 0438 12/07/18 0436 12/08/18 0442  WBC 5.5 5.2 5.6 5.8 6.1  CREATININE 2.41* 2.09* 2.02* 1.94* 1.67*    Estimated Creatinine Clearance: 34.5 mL/min (A) (by C-G formula based on SCr of 1.67 mg/dL (H)).    Allergies  Allergen Reactions  . Aleve [Naproxen Sodium] Other (See Comments)    Heart races  . Asa [Aspirin] Other (See Comments)    Nose bleeding  . Clonidine Derivatives Other (See Comments)    Dizziness and weakness  . Shellfish Allergy Nausea And Vomiting    Antimicrobials this admission: 12/27 cefepime >>   Dose adjustments this admission: 1/1 cefepime 2 g IV q24h --> 2 g IV q12h  Microbiology results: None  Thank you for allowing pharmacy to be a part of this patient's care.  Lenis Noon, PharmD 12/08/18 1:55 PM

## 2018-12-09 DIAGNOSIS — T8453XD Infection and inflammatory reaction due to internal right knee prosthesis, subsequent encounter: Secondary | ICD-10-CM | POA: Diagnosis not present

## 2018-12-09 DIAGNOSIS — E1122 Type 2 diabetes mellitus with diabetic chronic kidney disease: Secondary | ICD-10-CM | POA: Diagnosis not present

## 2018-12-09 DIAGNOSIS — M2550 Pain in unspecified joint: Secondary | ICD-10-CM | POA: Diagnosis not present

## 2018-12-09 DIAGNOSIS — T8450XD Infection and inflammatory reaction due to unspecified internal joint prosthesis, subsequent encounter: Secondary | ICD-10-CM | POA: Diagnosis not present

## 2018-12-09 DIAGNOSIS — R600 Localized edema: Secondary | ICD-10-CM | POA: Diagnosis not present

## 2018-12-09 DIAGNOSIS — M00861 Arthritis due to other bacteria, right knee: Secondary | ICD-10-CM | POA: Diagnosis not present

## 2018-12-09 DIAGNOSIS — R7989 Other specified abnormal findings of blood chemistry: Secondary | ICD-10-CM | POA: Diagnosis not present

## 2018-12-09 DIAGNOSIS — D649 Anemia, unspecified: Secondary | ICD-10-CM | POA: Diagnosis not present

## 2018-12-09 DIAGNOSIS — I10 Essential (primary) hypertension: Secondary | ICD-10-CM

## 2018-12-09 DIAGNOSIS — E7800 Pure hypercholesterolemia, unspecified: Secondary | ICD-10-CM | POA: Diagnosis not present

## 2018-12-09 DIAGNOSIS — E1129 Type 2 diabetes mellitus with other diabetic kidney complication: Secondary | ICD-10-CM | POA: Diagnosis not present

## 2018-12-09 DIAGNOSIS — M25361 Other instability, right knee: Secondary | ICD-10-CM | POA: Diagnosis not present

## 2018-12-09 DIAGNOSIS — I200 Unstable angina: Secondary | ICD-10-CM | POA: Diagnosis not present

## 2018-12-09 DIAGNOSIS — Z7401 Bed confinement status: Secondary | ICD-10-CM | POA: Diagnosis not present

## 2018-12-09 DIAGNOSIS — R011 Cardiac murmur, unspecified: Secondary | ICD-10-CM | POA: Diagnosis not present

## 2018-12-09 DIAGNOSIS — M009 Pyogenic arthritis, unspecified: Secondary | ICD-10-CM | POA: Diagnosis not present

## 2018-12-09 DIAGNOSIS — M1712 Unilateral primary osteoarthritis, left knee: Secondary | ICD-10-CM | POA: Diagnosis not present

## 2018-12-09 DIAGNOSIS — B965 Pseudomonas (aeruginosa) (mallei) (pseudomallei) as the cause of diseases classified elsewhere: Secondary | ICD-10-CM | POA: Diagnosis not present

## 2018-12-09 DIAGNOSIS — T8459XD Infection and inflammatory reaction due to other internal joint prosthesis, subsequent encounter: Secondary | ICD-10-CM | POA: Diagnosis not present

## 2018-12-09 DIAGNOSIS — N179 Acute kidney failure, unspecified: Secondary | ICD-10-CM | POA: Diagnosis not present

## 2018-12-09 DIAGNOSIS — E119 Type 2 diabetes mellitus without complications: Secondary | ICD-10-CM | POA: Diagnosis not present

## 2018-12-09 DIAGNOSIS — R262 Difficulty in walking, not elsewhere classified: Secondary | ICD-10-CM | POA: Diagnosis not present

## 2018-12-09 DIAGNOSIS — Z0000 Encounter for general adult medical examination without abnormal findings: Secondary | ICD-10-CM | POA: Diagnosis not present

## 2018-12-09 DIAGNOSIS — Z4789 Encounter for other orthopedic aftercare: Secondary | ICD-10-CM | POA: Diagnosis not present

## 2018-12-09 DIAGNOSIS — M6281 Muscle weakness (generalized): Secondary | ICD-10-CM | POA: Diagnosis not present

## 2018-12-09 LAB — BASIC METABOLIC PANEL
ANION GAP: 13 (ref 5–15)
BUN: 27 mg/dL — ABNORMAL HIGH (ref 8–23)
CO2: 21 mmol/L — ABNORMAL LOW (ref 22–32)
Calcium: 8.1 mg/dL — ABNORMAL LOW (ref 8.9–10.3)
Chloride: 107 mmol/L (ref 98–111)
Creatinine, Ser: 1.56 mg/dL — ABNORMAL HIGH (ref 0.44–1.00)
GFR calc Af Amer: 38 mL/min — ABNORMAL LOW (ref >60)
GFR, EST NON AFRICAN AMERICAN: 32 mL/min — AB (ref >60)
Glucose, Bld: 119 mg/dL — ABNORMAL HIGH (ref 70–99)
Potassium: 2.8 mmol/L — ABNORMAL LOW (ref 3.5–5.1)
Sodium: 141 mmol/L (ref 135–145)

## 2018-12-09 LAB — GLUCOSE, CAPILLARY
GLUCOSE-CAPILLARY: 120 mg/dL — AB (ref 70–99)
Glucose-Capillary: 128 mg/dL — ABNORMAL HIGH (ref 70–99)
Glucose-Capillary: 104 mg/dL — ABNORMAL HIGH (ref 70–99)

## 2018-12-09 MED ORDER — ISOSORBIDE DINITRATE 10 MG PO TABS: 10.0000 mg | ORAL_TABLET | Freq: Three times a day (TID) | ORAL | 0 refills | Status: DC

## 2018-12-09 MED ORDER — OXYCODONE HCL 5 MG PO TABS: 5.0000 mg | ORAL_TABLET | Freq: Four times a day (QID) | ORAL | 0 refills | Status: DC | PRN

## 2018-12-09 MED ORDER — SITAGLIPTIN PHOSPHATE 50 MG PO TABS: 50.0000 mg | ORAL_TABLET | Freq: Every day | ORAL | 0 refills | Status: DC

## 2018-12-09 MED ORDER — CIPROFLOXACIN HCL 500 MG PO TABS: 500.0000 mg | ORAL_TABLET | Freq: Two times a day (BID) | ORAL | 0 refills | Status: DC

## 2018-12-09 MED ORDER — POTASSIUM CHLORIDE CRYS ER 20 MEQ PO TBCR
40.0000 meq | EXTENDED_RELEASE_TABLET | Freq: Two times a day (BID) | ORAL | Status: DC
  Administered 2018-12-09: 40 meq via ORAL
  Filled 2018-12-09: qty 2

## 2018-12-09 NOTE — Discharge Summary (Signed)
Physician Discharge Summary  Beth [name] PIR:[id number] DOB: 09-11-1946 DOA: 12/03/2018  PCP: Claretta Fraise, MD  Admit date: 12/03/2018 Discharge date: 12/09/2018  Admitted From: Home Disposition:  SNF  Recommendations for Outpatient Follow-up:  1. Follow up with PCP in 1-2 weeks 2. Follow up with ID as scheduled 3. Recommend repeat bmet in 1-2 weeks 4. Complete PO ciprofloxacin through 1/10  Prescription for very limited quantity of opiate given to patient as required by skilled nursing facility  Discharge Condition:Improved CODE STATUS:Full Diet recommendation: Heart healthy   Brief/Interim Summary: 73 y.o.femalewith a history of T2DM, HTN, HLD and recent right TKA complicated by infection requiring resection and antibiotic spacer placement 11/03/2018 and IV antibiotics via RUE PICC who presented to the ED 12/27 with right arm swelling and pain. She reported discharge from [facility] 12/23 after which she had no home health care to administer antibiotics or maintain/flush PICC line. On arrival she was hypertensive, afebrile, with creatinine up to 2.54 from normal baseline. PICC was removed, though RUE doppler was negative for DVT. ID was consulted and recommended continuing IV cefepime or ceftazidime while admitted and completing antibiotic course with oral ciprofloxacin. IV fluids were given with mild improvement in renal function. Anemia worsened with hemodilution for which 1u PRBCs given.  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  - Recommendation for ID  follow up in 7 days from discharge.  AKI: Suspect dehydration, ARB, metformin contributing.Renal U/S normal. Continues slow improvement. Has normal baseline. - Labs reviewed. Cr improving with IVF hydration - Held nephrotoxic agents, including ARB - Recommend repeat BMET in 1-2 weeks as outpatient  Right arm swelling associated with PICC line which was unaccessed for several days. No DVT on [place].  - PICC pulled. - Elevate above level of the heart, continue compression - Improved  Left arm swelling associated with infiltrated IV.  - Elevate, warm compresses.  T2DM:  - SSI while inpatient.  - given ARF, held metformin. Will continue on Januvia  HTN: Not controlled.  - Restarted home norvasc, hydralazine, nebivolol. Continue hyydralazine with  isosorbide. Has clonidine allergy.  - With resistant HTN and hypokalemia, consider hyperaldo work up and/or empiric spironolactone when renal function stabilizes. - HoldingACE/ARB with AKI - BP stable  Anemia of chronic disease: Worsened by hemodilution: s/p 1u PRBCs 12/29 with no evidence of bleeding.Hgb stable.  HLD:  - Continue statin - Stable at present  Hypokalemia:  -Replaced -Recommend repeat BMET within one week, continue to replace electrolytes as needed  Morbid obesity: BMI 37.  - High protein diet recommended  Discharge Instructions   Allergies as of 12/09/2018      Reactions   Aleve [naproxen Sodium] Other (See Comments)   Heart races   Asa [aspirin] Other (See Comments)   Nose bleeding   Clonidine Derivatives Other (See Comments)   Dizziness and weakness   Shellfish Allergy Nausea And Vomiting      Medication List    STOP taking these medications   ceFEPime  IVPB Commonly known as:  MAXIPIME   ceFEPIme 2 g  in sodium chloride 0.9 % 100 mL   labetalol 200 MG tablet Commonly known as:  NORMODYNE   metFORMIN 500 MG 24 hr tablet Commonly known as:  GLUCOPHAGE-XR   methocarbamol 500 MG  tablet Commonly known as:  ROBAXIN   olmesartan 40 MG tablet Commonly known as:  BENICAR   predniSONE 5 MG (21) Tbpk tablet Commonly known as:  STERAPRED UNI-PAK 21 TAB   rivaroxaban 10 MG Tabs tablet Commonly known as:  XARELTO   traMADol 50 MG tablet Commonly known as:  ULTRAM     TAKE these medications   acetaminophen 500 MG tablet Commonly known as:  TYLENOL Take 2 tablets (1,000 mg total) by mouth every 6 (six) hours as needed for mild pain.   amLODipine 10 MG tablet Commonly known as:  NORVASC TAKE 1 TABLET DAILY IN THE EVENING What changed:    how much to take  how to take this  when to take this  additional instructions   atorvastatin 20 MG tablet Commonly known as:  LIPITOR Take 1 tablet (20 mg total) by mouth every evening.   ciprofloxacin 500 MG tablet Commonly known as:  CIPRO Take 1 tablet (500 mg total) by mouth 2 (two) times daily for 8 days.   ferrous sulfate 325 (65 FE) MG tablet Take 325 mg by mouth 2 (two) times daily with a meal.   GAVILAX powder Generic drug:  polyethylene glycol powder MIX 17 GRAMS INTO 8OZ OF WATER AND DRINK DAILY What changed:  See the new instructions.   hydrALAZINE 100 MG tablet Commonly known as:  APRESOLINE TAKE ONE (1) TABLET THREE (3) TIMES EACH DAY What changed:    how much to take  how to take this  when to take this  additional instructions   isosorbide dinitrate 10 MG tablet Commonly known as:  ISORDIL Take 1 tablet (10 mg total) by mouth 3 (three) times daily.   Nebivolol HCl 20 MG Tabs Commonly known as:  BYSTOLIC TAKE ONE (1) TABLET EACH DAY What changed:    how much to take  how to take this  when to take this  additional instructions   ONE TOUCH ULTRA TEST test strip Generic drug:  glucose blood Use to check glucose up to four times daily   ONETOUCH DELICA LANCETS 27P Misc USE TO CHECK GLUCOSE UP TO  4 TIMES DAILY   oxyCODONE 5 MG immediate release tablet Commonly known as:   Oxy IR/ROXICODONE Take 1 tablet (5 mg total) by mouth every 6 (six) hours as needed for moderate pain (pain score 4-6).   sitaGLIPtin 50 MG tablet Commonly known as:  JANUVIA Take 1 tablet (50 mg total) by mouth daily.       Contact information for follow-up providers    Tommy Medal, Lavell Islam, MD. Schedule an appointment as soon as possible for a visit.   Specialty:  Infectious Diseases Why:  as scheduled Contact information: [address] [address] [phone]            Contact information for after-discharge care    [facility] .   Service:  Skilled Nursing Contact information: [address] [address] [phone]                 Allergies  Allergen Reactions  . Aleve [Naproxen Sodium] Other (See Comments)    Heart races  . Asa [Aspirin] Other (See Comments)  Nose bleeding  . Clonidine Derivatives Other (See Comments)    Dizziness and weakness  . Shellfish Allergy Nausea And Vomiting    Procedures/Studies: US Renal  Result Date: 12/04/2018 CLINICAL DATA:  Acute kidney injury.  History of hypertension. EXAM: RENAL / URINARY TRACT ULTRASOUND COMPLETE COMPARISON:  None applicable FINDINGS: Right Kidney: Renal measurements: 9.7 x 4.6 x 5.9 centimeter = volume: 135.9 mL . Echogenicity within normal limits. No mass or hydronephrosis visualized. Left Kidney: Renal measurements: 8.9 x 5.8 x 4.3 centimeters = volume: 116.2 mL. Echogenicity within normal limits. No mass or hydronephrosis visualized. Bladder: Appears normal for degree of bladder distention. Additional: Study quality is degraded by overlying bowel loops. IMPRESSION: Normal renal ultrasound. Electronically Signed   By: Nolon Nations M.D.   On: 12/04/2018 14:18   Ue Venous Duplex (mc & Wl 7 Am - 7 Pm)  Result Date: 12/05/2018 UPPER VENOUS STUDY  Indications: Pain, and Swelling Limitations: Body habitus, line and  PICC on site. Comparison Study: No prior study available Performing Technologist: Lorina Rabon  Examination Guidelines: A complete evaluation includes B-mode imaging, spectral Doppler, color Doppler, and power Doppler as needed of all accessible portions of each vessel. Bilateral testing is considered an integral part of a complete examination. Limited examinations for reoccurring indications may be performed as noted.  Right Findings: +----------+------------+----------+---------+-----------+---------------------+ RIGHT     CompressiblePropertiesPhasicitySpontaneous       Summary        +----------+------------+----------+---------+-----------+---------------------+ IJV           Full                 Yes       Yes                          +----------+------------+----------+---------+-----------+---------------------+ Subclavian    Full                 Yes       Yes        PICC on site      +----------+------------+----------+---------+-----------+---------------------+ Axillary      Full                           Yes        PICC on site      +----------+------------+----------+---------+-----------+---------------------+ Brachial      Full                           Yes     Limited visibility                                                         due to IV on site   +----------+------------+----------+---------+-----------+---------------------+ Radial        Full                                                        +----------+------------+----------+---------+-----------+---------------------+ Ulnar         Full                                                        +----------+------------+----------+---------+-----------+---------------------+  Cephalic      Full                                                        +----------+------------+----------+---------+-----------+---------------------+ Basilic       Full                            Yes                          +----------+------------+----------+---------+-----------+---------------------+ Study limited due to PICC on site.  Left Findings: +----------+------------+----------+---------+-----------+-------+ LEFT      CompressiblePropertiesPhasicitySpontaneousSummary +----------+------------+----------+---------+-----------+-------+ Subclavian    Full                 Yes       Yes            +----------+------------+----------+---------+-----------+-------+  Summary:  Right: No evidence of deep vein thrombosis in the upper extremity. No evidence of superficial vein thrombosis in the upper extremity. No evidence of thrombosis in the subclavian. Study limited due to PICC on site.  Left: No evidence of thrombosis in the subclavian.  *See table(s) above for measurements and observations.  Diagnosing physician: Curt Jews MD Electronically signed by Curt Jews MD on 12/05/2018 at 9:05:29 AM.    Final      Subjective: Eager to be discharged  Discharge Exam: Vitals:   12/08/18 2022 12/09/18 0511  BP: (!) 158/89 (!) 155/80  Pulse: 71 72  Resp: (!) 28 20  Temp: 97.8 F (36.6 C) 98.5 F (36.9 C)  SpO2: 96% 98%   Vitals:   12/08/18 1303 12/08/18 1545 12/08/18 2022 12/09/18 0511  BP: (!) 173/82 (!) 181/98 (!) 158/89 (!) 155/80  Pulse: 73 67 71 72  Resp: 16  (!) 28 20  Temp: (!) 97.4 F (36.3 C)  97.8 F (36.6 C) 98.5 F (36.9 C)  TempSrc: Oral  Oral Oral  SpO2: 96%  96% 98%  Weight:      Height:        General: Pt is alert, awake, not in acute distress Cardiovascular: RRR, S1/S2 +, no rubs, no gallops Respiratory: CTA bilaterally, no wheezing, no rhonchi Abdominal: Soft, NT, ND, bowel sounds + Extremities: no edema, no cyanosis   The results of significant diagnostics from this hospitalization (including imaging, microbiology, ancillary and laboratory) are listed below for reference.     Microbiology: No results found for this or any previous visit  (from the past 240 hour(s)).   Labs: BNP (last 3 results) No results for input(s): BNP in the last 8760 hours. Basic Metabolic Panel: Recent Labs  Lab 12/05/18 0437 12/06/18 0438 12/07/18 0436 12/08/18 0442 12/09/18 0427  NA 139 141 139 140 141  K 3.3* 3.2* 3.4* 3.4* 2.8*  CL 110 111 109 110 107  CO2 20* 20* 20* 20* 21*  GLUCOSE 119* 119* 116* 122* 119*  BUN 38* 35* 37* 30* 27*  CREATININE 2.09* 2.02* 1.94* 1.67* 1.56*  CALCIUM 8.2* 8.4* 8.4* 8.4* 8.1*  MG  --   --  1.8  --   --    Liver Function Tests: Recent Labs  Lab 12/03/18 1955  AST 13*  ALT 10  ALKPHOS 109  BILITOT 1.1  PROT 7.6  ALBUMIN 3.1*  No results for input(s): LIPASE, AMYLASE in the last 168 hours. No results for input(s): AMMONIA in the last 168 hours. CBC: Recent Labs  Lab 12/04/18 0404 12/05/18 0437 12/06/18 0438 12/07/18 0436 12/08/18 0442  WBC 5.5 5.2 5.6 5.8 6.1  HGB 8.7* 7.5* 9.2* 9.0* 8.9*  HCT 29.6* 25.8* 30.9* 30.1* 30.3*  MCV 82.7 85.1 83.1 82.7 83.5  PLT 253 224 257 267 276   Cardiac Enzymes: No results for input(s): CKTOTAL, CKMB, CKMBINDEX, TROPONINI in the last 168 hours. BNP: Invalid input(s): POCBNP CBG: Recent Labs  Lab 12/08/18 0720 12/08/18 1157 12/08/18 1632 12/08/18 2036 12/09/18 0733  GLUCAP 126* 196* 101* 130* 128*   D-Dimer No results for input(s): DDIMER in the last 72 hours. Hgb A1c No results for input(s): HGBA1C in the last 72 hours. Lipid Profile No results for input(s): CHOL, HDL, LDLCALC, TRIG, CHOLHDL, LDLDIRECT in the last 72 hours. Thyroid function studies No results for input(s): TSH, T4TOTAL, T3FREE, THYROIDAB in the last 72 hours.  Invalid input(s): FREET3 Anemia work up Recent Labs    12/07/18 0436  VITAMINB12 366  FERRITIN 169  TIBC 176*  IRON 14*   Urinalysis    Component Value Date/Time   COLORURINE STRAW (A) 12/04/2018 0124   APPEARANCEUR CLEAR 12/04/2018 0124   APPEARANCEUR Clear 09/28/2018 1628   LABSPEC 1.008  12/04/2018 0124   PHURINE 5.0 12/04/2018 0124   GLUCOSEU NEGATIVE 12/04/2018 0124   HGBUR SMALL (A) 12/04/2018 0124   BILIRUBINUR NEGATIVE 12/04/2018 0124   BILIRUBINUR Negative 09/28/2018 1628   KETONESUR NEGATIVE 12/04/2018 0124   PROTEINUR 30 (A) 12/04/2018 0124   UROBILINOGEN 1.0 04/30/2015 1400   NITRITE NEGATIVE 12/04/2018 0124   LEUKOCYTESUR NEGATIVE 12/04/2018 0124   LEUKOCYTESUR Negative 09/28/2018 1628   Sepsis Labs Invalid input(s): PROCALCITONIN,  WBC,  LACTICIDVEN Microbiology No results found for this or any previous visit (from the past 240 hour(s)).  Time spent: 30 min  SIGNED:   Marylu Lund, MD  [facility] 12/09/2018, 10:24 AM  If 7PM-7AM, please contact night-coverage

## 2018-12-09 NOTE — Progress Notes (Signed)
Occupational Therapy Treatment Patient Details Name: Beth [name] MRN: [id number] DOB: [birth year] Today's Date: 12/09/2018    History of present illness 73 year old female admitted for acute kidney injury, RUE swelling.  PMHx:  R knee spacer placed on 11/03/18,  h/o DM, HTN, CVA and multiple knee sxs   OT comments  Able to use built up foam and brush teeth.  Toileting also performed  Follow Up Recommendations  SNF    Equipment Recommendations  None recommended by OT    Recommendations for Other Services      Precautions / Restrictions Precautions Precautions: Fall Precaution Comments: R hand swollen especially 3rd digit; improving Required Braces or Orthoses: Knee Immobilizer - Right Knee Immobilizer - Right: On at all times Restrictions Other Position/Activity Restrictions: WBAT       Mobility Bed Mobility         Supine to sit: Min assist     General bed mobility comments: assist for LLE; HOB raised  Transfers   Equipment used: Rolling walker (2 wheeled)   Sit to Stand: Mod assist;+2 physical assistance Stand pivot transfers: Mod assist;+2 physical assistance       General transfer comment: assist to rise and stabilize; cues for UE placement.  Pt needed assistance to control descent and to extend LLE when sitting    Balance     Sitting balance-[name]: Fair       Standing balance-[name]: Poor                             ADL either performed or assessed with clinical judgement   ADL       Grooming: Set up;Oral care;Sitting Grooming Details (indicate cue type and reason): able to use RUE to hold toothpaste and brush teeth.  Applied toothpaste with LUE                 Toilet Transfer: Moderate assistance;+2 for physical assistance;+2 for safety/equipment;Squat-pivot;BSC;RW   Toileting- Clothing Manipulation and Hygiene: Maximal assistance;+2 for physical assistance;+2 for safety/equipment;Sit to/from stand          General ADL Comments: used toilet, brushed teeth with built up foam on toothbrush.  Recliner brought up behind her afterwards     Vision       Perception     Praxis      Cognition Arousal/Alertness: Awake/alert Behavior During Therapy: WFL for tasks assessed/performed Overall Cognitive Status: Within Functional Limits for tasks assessed                                          Exercises Other Exercises Other Exercises: gentle stretch into composite flexion.  3rd finger remains most edemenous; tighter at PIP/DIP.  Others more tight at MCPs   Shoulder Instructions       General Comments      Pertinent Vitals/ Pain       Faces Pain Scale: Hurts a little bit Pain Location: R hand Pain Descriptors / Indicators: Discomfort Pain Intervention(s): Limited activity within patient's tolerance;Monitored during session;Repositioned  Home Living                                          Prior Functioning/Environment  Frequency  Min 2X/week        Progress Toward Goals  OT Goals(current goals can now be found in the care plan section)  Progress towards OT goals: Progressing toward goals     Plan      Co-evaluation                 AM-PAC OT "6 Clicks" Daily Activity     Outcome Measure   Help from another person eating meals?: A Little Help from another person taking care of personal grooming?: A Little Help from another person toileting, which includes using toliet, bedpan, or urinal?: A Lot Help from another person bathing (including washing, rinsing, drying)?: A Lot Help from another person to put on and taking off regular upper body clothing?: A Lot Help from another person to put on and taking off regular lower body clothing?: Total 6 Click Score: 13    End of Session Equipment Utilized During Treatment: Right knee immobilizer;Gait belt;Rolling walker  OT Visit Diagnosis: Pain;Unsteadiness on feet  (R26.81);Muscle weakness (generalized) (M62.81) Pain - Right/Left: Right Pain - part of body: Arm;Knee   Activity Tolerance Patient tolerated treatment well   Patient Left in chair;with call bell/phone within reach   Nurse Communication          Time: 1335-1400 OT Time Calculation (min): 25 min  Charges: OT General Charges $OT Visit: 1 Visit OT Treatments $Self Care/Home Management : 23-37 mins  Beth [name], OTR/L Acute Rehabilitation Services [phone] [place] pager [phone] office 12/09/2018   Beth [name] 12/09/2018, 4:15 PM

## 2018-12-09 NOTE — Telephone Encounter (Signed)
Thanks Caryl Pina I am sorry it sounds like such a  mess

## 2018-12-09 NOTE — Progress Notes (Signed)
Report called to Johnathan Hausen at [place]

## 2018-12-09 NOTE — Care Management Important Message (Signed)
Important Message  Patient Details  Name: MICHEALA MORISSETTE MRN: [id number] Date of Birth: 1946-07-29   Medicare Important Message Given:  Yes    Kerin Salen 12/09/2018, 10:19 AMImportant Message  Patient Details  Name: ZYLPHA POYNOR MRN: [id number] Date of Birth: 02/29/1946   Medicare Important Message Given:  Yes    Kerin Salen 12/09/2018, 10:19 AM

## 2018-12-09 NOTE — Progress Notes (Signed)
Patient discharged to [facility] RN

## 2018-12-09 NOTE — Progress Notes (Signed)
Patient brought pack to [place] to room 1322. Apparent mix up with authorization with facility  D Mateo Flow RN

## 2018-12-09 NOTE — Plan of Care (Signed)
Plan of care reviewed and discussed with the patient. 

## 2018-12-09 NOTE — Clinical Social Work Placement (Addendum)
Ship broker received. 4:00pm [facility] arranged for transport.  CSW notified patient son Clair Gulling.    CLINICAL SOCIAL WORK PLACEMENT  NOTE  Date:  12/09/2018  Patient Details  Name: Beth [name] MRN: [id number] Date of Birth: 05-25-1946  Clinical Social Work is seeking post-discharge placement for this patient at the [place] level of care (*CSW will initial, date and re-position this form in  chart as items are completed):  Yes   Patient/family provided with [facility] Work Department's list of facilities offering this level of care within the geographic area requested by the patient (or if unable, by the patient's family).  Yes   Patient/family informed of their freedom to choose among providers that offer the needed level of care, that participate in Medicare, Medicaid or managed care program needed by the patient, have an available bed and are willing to accept the patient.  Yes   Patient/family informed of [facility]'s ownership interest in [facility] and [facility], as well as of the fact that they are under no obligation to receive care at these facilities.  PASRR submitted to EDS on       PASRR number received on       Existing PASRR number confirmed on 12/09/18     FL2 transmitted to all facilities in geographic area requested by pt/family on       FL2 transmitted to all facilities within larger geographic area on 12/07/18     Patient informed that his/her managed care company has contracts with or will negotiate with certain facilities, including the following:  [facility]     Yes   Patient/family informed of bed offers received.  Patient chooses bed at [facility]     Physician recommends and patient chooses bed at      Patient to be transferred to [facility] on 12/09/18.  Patient to be transferred to facility by [facility]     Patient family notified on 12/09/18 of transfer.  Name of family member  notified:  Son Clair Gulling notified.      PHYSICIAN Please prepare priority discharge summary, including medications, Please prepare prescriptions     Additional Comment:    _______________________________________________ Lia Hopping, LCSW 12/09/2018, 10:00 AM

## 2018-12-10 ENCOUNTER — Encounter: Payer: Self-pay | Admitting: Family Medicine

## 2018-12-10 DIAGNOSIS — I10 Essential (primary) hypertension: Secondary | ICD-10-CM | POA: Diagnosis not present

## 2018-12-10 DIAGNOSIS — T8453XD Infection and inflammatory reaction due to internal right knee prosthesis, subsequent encounter: Secondary | ICD-10-CM | POA: Diagnosis not present

## 2018-12-10 DIAGNOSIS — M1712 Unilateral primary osteoarthritis, left knee: Secondary | ICD-10-CM | POA: Diagnosis not present

## 2018-12-10 DIAGNOSIS — E119 Type 2 diabetes mellitus without complications: Secondary | ICD-10-CM | POA: Diagnosis not present

## 2018-12-13 DIAGNOSIS — T8453XD Infection and inflammatory reaction due to internal right knee prosthesis, subsequent encounter: Secondary | ICD-10-CM | POA: Diagnosis not present

## 2018-12-13 DIAGNOSIS — E119 Type 2 diabetes mellitus without complications: Secondary | ICD-10-CM | POA: Diagnosis not present

## 2018-12-13 DIAGNOSIS — E7800 Pure hypercholesterolemia, unspecified: Secondary | ICD-10-CM | POA: Diagnosis not present

## 2018-12-13 DIAGNOSIS — Z0000 Encounter for general adult medical examination without abnormal findings: Secondary | ICD-10-CM | POA: Diagnosis not present

## 2018-12-13 DIAGNOSIS — I10 Essential (primary) hypertension: Secondary | ICD-10-CM | POA: Diagnosis not present

## 2018-12-15 ENCOUNTER — Ambulatory Visit: Payer: Medicare Other | Admitting: Infectious Disease

## 2018-12-15 ENCOUNTER — Encounter: Payer: Self-pay | Admitting: Infectious Disease

## 2018-12-15 VITALS — BP 135/75 | HR 69 | Temp 97.8°F | Wt 202.0 lb

## 2018-12-15 DIAGNOSIS — T8453XD Infection and inflammatory reaction due to internal right knee prosthesis, subsequent encounter: Secondary | ICD-10-CM | POA: Diagnosis not present

## 2018-12-15 DIAGNOSIS — M009 Pyogenic arthritis, unspecified: Secondary | ICD-10-CM

## 2018-12-15 DIAGNOSIS — B965 Pseudomonas (aeruginosa) (mallei) (pseudomallei) as the cause of diseases classified elsewhere: Secondary | ICD-10-CM

## 2018-12-15 DIAGNOSIS — M00861 Arthritis due to other bacteria, right knee: Secondary | ICD-10-CM

## 2018-12-15 DIAGNOSIS — A498 Other bacterial infections of unspecified site: Secondary | ICD-10-CM

## 2018-12-15 DIAGNOSIS — T8450XD Infection and inflammatory reaction due to unspecified internal joint prosthesis, subsequent encounter: Secondary | ICD-10-CM

## 2018-12-15 HISTORY — DX: Other bacterial infections of unspecified site: A49.8

## 2018-12-15 NOTE — Progress Notes (Signed)
Subjective:  Chief complaint still with some knee pain but better than before and pains in other areas   Patient ID: Beth [name], female    DOB: March 31, 1946, 73 y.o.   MRN: [id number]  HPI  Beth [name] is a 73 y.o. 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  She apparently originally had right knee total arthroplasty in July 2015.  In the interim she had an I&D in October 2015 with poly-exchange followed by antibiotics.  She then had in 2016 a right knee resection arthroplasty with placement of antibiotic spacers followed by antibiotics and followed by reimplantation of new prosthetic knee.  Unfortunately she is again had failure and of the attempts to eradicate infection in his knee and her prosthetic knee has been removed and antibiotic spacer placed  Is my understanding that no organism has been isolated in the past.  Does not appear that we were ever involved with her care before thing when I can see and less it was in the paper record I cannot see any notes over the last several years in epic.  Time she grew a pansensitive pseudomonal species.  She was discharged on cefepime but then admitted with renal failure and changed to oral ciprofloxacin she was having trouble with her PICC line as well.  Renal function had improved during her hospitalization and she just continued on ciprofloxacin in the skilled nursing facility.  Knee pain is better now versus when she was in the hospital.  She is due to complete the ciprofloxacin on 10 January.   Review of Systems  Constitutional: Negative for activity change, appetite change, chills, diaphoresis, fatigue, fever and unexpected weight change.  HENT: Negative for congestion, rhinorrhea, sinus pressure, sneezing, sore throat and trouble swallowing.   Eyes: Negative for photophobia and visual disturbance.  Respiratory: Negative for cough, chest tightness, shortness of breath, wheezing and stridor.    Cardiovascular: Negative for chest pain, palpitations and leg swelling.  Gastrointestinal: Negative for abdominal distention, abdominal pain, anal bleeding, blood in stool, constipation, diarrhea, nausea and vomiting.  Genitourinary: Negative for difficulty urinating, dysuria, flank pain and hematuria.  Musculoskeletal: Positive for arthralgias. Negative for back pain, gait problem, joint swelling and myalgias.  Skin: Positive for wound. Negative for color change, pallor and rash.  Neurological: Negative for dizziness, tremors, weakness and light-headedness.  Hematological: Negative for adenopathy. Does not bruise/bleed easily.  Psychiatric/Behavioral: Negative for agitation, behavioral problems, confusion, decreased concentration, dysphoric mood and sleep disturbance.       Objective:   Physical Exam Constitutional:      General: She is not in acute distress.    Appearance: She is not diaphoretic.  HENT:     Head: Normocephalic and atraumatic.     Right Ear: External ear normal.     Left Ear: External ear normal.     Nose: Nose normal.     Mouth/Throat:     Pharynx: No oropharyngeal exudate.  Eyes:     General: No scleral icterus.    Conjunctiva/sclera: Conjunctivae normal.  Neck:     Musculoskeletal: Normal range of motion and neck supple.  Cardiovascular:     Rate and Rhythm: Normal rate and regular rhythm.  Pulmonary:     Effort: Pulmonary effort is normal. No respiratory distress.  Abdominal:     General: There is no distension.  Musculoskeletal: Normal range of motion.        General: No tenderness.  Lymphadenopathy:  Cervical: No cervical adenopathy.  Skin:    General: Skin is warm and dry.     Coloration: Skin is not pale.     Findings: No erythema or rash.  Neurological:     General: No focal deficit present.     Mental Status: She is alert and oriented to person, place, and time.     Coordination: Coordination normal.  Psychiatric:        Mood and Affect:  Mood normal.        Behavior: Behavior normal.        Thought Content: Thought content normal.        Judgment: Judgment normal.    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  We will check inflammatory markers today along with kidney function CBC with differential.  If her sed rate and C-reactive protein are downtrending she will complete her ciprofloxacin on the 10th.  We will see her again at the end of the month and see how she is doing.  Again I be very cautious about reimplanting prosthetic knee in this lady.  Also have some anxiety that she could have contiguous infection in the adjacent bone near the prosthetic material.  For now I think the approach we should take if she is doing better clinically and her inflammatory markers are growing in the right direction is to stop antibiotics and observe her.  She if she has worsening off antibiotics I would recommend reimaging her and aspirating her knee for cell count differential and culture.  If reimplantation is considered in addition to following inflammatory markers I would strongly recommend aspiration of the knee off antibiotics to send for cell count and differential as well as for culture with the preference for the former being done if there is limited spinal fluid to sample  Then if fluid is reassuring one could consider reimplanting the prosthesis.  I spent greater than 25 minutes with the patient including greater than 50% of time in face to face counsel of the patient on the nature of the prosthetic joint infections including hers with pseudomonas aeruginosa  and in coordination of her care.

## 2018-12-15 NOTE — Patient Instructions (Signed)
We will check labs today  IF reassuring as far as Infection is concerned you can then stop  The CIPROLOXACIN after dose on the 10th of January  IF not reassuring we will be calling to extend these antibiotics

## 2018-12-16 ENCOUNTER — Other Ambulatory Visit: Payer: Self-pay | Admitting: Behavioral Health

## 2018-12-16 ENCOUNTER — Telehealth: Payer: Self-pay | Admitting: Behavioral Health

## 2018-12-16 DIAGNOSIS — M009 Pyogenic arthritis, unspecified: Secondary | ICD-10-CM

## 2018-12-16 DIAGNOSIS — T8453XD Infection and inflammatory reaction due to internal right knee prosthesis, subsequent encounter: Secondary | ICD-10-CM

## 2018-12-16 DIAGNOSIS — A498 Other bacterial infections of unspecified site: Secondary | ICD-10-CM

## 2018-12-16 DIAGNOSIS — T8450XD Infection and inflammatory reaction due to unspecified internal joint prosthesis, subsequent encounter: Secondary | ICD-10-CM

## 2018-12-16 LAB — BASIC METABOLIC PANEL WITH GFR
BUN/Creatinine Ratio: 21 (calc) (ref 6–22)
BUN: 30 mg/dL — ABNORMAL HIGH (ref 7–25)
CO2: 27 mmol/L (ref 20–32)
Calcium: 9 mg/dL (ref 8.6–10.4)
Chloride: 106 mmol/L (ref 98–110)
Creat: 1.41 mg/dL — ABNORMAL HIGH (ref 0.60–0.93)
GFR, EST AFRICAN AMERICAN: 42 mL/min/{1.73_m2} — AB (ref >=60)
GFR, Est Non African American: 37 mL/min/{1.73_m2} — ABNORMAL LOW (ref >=60)
Glucose, Bld: 120 mg/dL — ABNORMAL HIGH (ref 65–99)
Potassium: 4.2 mmol/L (ref 3.5–5.3)
Sodium: 143 mmol/L (ref 135–146)

## 2018-12-16 LAB — CBC WITH DIFFERENTIAL/PLATELET
ABSOLUTE MONOCYTES: 701 {cells}/uL (ref 200–950)
BASOS PCT: 0.4 %
Basophils Absolute: 29 cells/uL (ref 0–200)
Eosinophils Absolute: 285 cells/uL (ref 15–500)
Eosinophils Relative: 3.9 %
HEMATOCRIT: 30.1 % — AB (ref 35.0–45.0)
Hemoglobin: 9.4 g/dL — ABNORMAL LOW (ref 11.7–15.5)
Lymphs Abs: 1183 cells/uL (ref 850–3900)
MCH: 24.7 pg — ABNORMAL LOW (ref 27.0–33.0)
MCHC: 31.2 g/dL — ABNORMAL LOW (ref 32.0–36.0)
MCV: 79.2 fL — ABNORMAL LOW (ref 80.0–100.0)
MPV: 9.9 fL (ref 7.5–12.5)
Monocytes Relative: 9.6 %
NEUTROS PCT: 69.9 %
Neutro Abs: 5103 cells/uL (ref 1500–7800)
Platelets: 493 10*3/uL — ABNORMAL HIGH (ref 140–400)
RBC: 3.8 10*6/uL (ref 3.80–5.10)
RDW: 15 % (ref 11.0–15.0)
Total Lymphocyte: 16.2 %
WBC: 7.3 10*3/uL (ref 3.8–10.8)

## 2018-12-16 LAB — SEDIMENTATION RATE: Sed Rate: 70 mm/h — ABNORMAL HIGH (ref 0–30)

## 2018-12-16 LAB — C-REACTIVE PROTEIN: CRP: 29.2 mg/L — ABNORMAL HIGH (ref <8.0)

## 2018-12-16 MED ORDER — CIPROFLOXACIN HCL 500 MG PO TABS: 500.0000 mg | ORAL_TABLET | Freq: Two times a day (BID) | ORAL | 5 refills | Status: DC

## 2018-12-16 NOTE — Telephone Encounter (Signed)
Attempted to call Chales Abrahams, unable to get her on the phone.  Called her son Monica Martinez.  Informed him per Dr. Tommy Medal that his mother's Sed rate has remained unchanged.  Informed him his mother needs to take Ciprofloxacin 500mg  every 12 hours and needs to stay on this until further notice.  He verbalized understanding and states he will give his mother this information.  He states medication needs to be sent to [facility] in [place].  Medication sent to [facility] electronically. Pricilla Riffle RN

## 2018-12-16 NOTE — Telephone Encounter (Signed)
-----   Message from Truman Hayward, MD sent at 12/16/2018 10:25 AM EST ----- Regarding: FW: pts SED rate unchanged which is NOT reassuring. Can we call in rx for ciprofloxacin 500 mg q 12 hours # 60 w 5 refills and make sure she stays on this? ----- Message ----- From: Interface, [facility] Results In Sent: 12/15/2018   3:20 PM EST To: Truman Hayward, MD

## 2018-12-16 NOTE — Telephone Encounter (Signed)
Perfect Caryl Pina!

## 2018-12-20 ENCOUNTER — Telehealth: Payer: Self-pay

## 2018-12-20 DIAGNOSIS — R600 Localized edema: Secondary | ICD-10-CM | POA: Diagnosis not present

## 2018-12-20 DIAGNOSIS — T8453XD Infection and inflammatory reaction due to internal right knee prosthesis, subsequent encounter: Secondary | ICD-10-CM | POA: Diagnosis not present

## 2018-12-20 NOTE — Telephone Encounter (Signed)
Received call from [facility] for advise regarding continuing Cipro 500mg  BID.  Per last phone note patient is to continue Cipro through next appointment with Dr. Tommy Medal. Informed nurse of information.  S.Elizah Mierzwa, LPN

## 2018-12-21 ENCOUNTER — Encounter: Payer: Self-pay | Admitting: Family Medicine

## 2018-12-21 NOTE — Telephone Encounter (Signed)
Thank you so much Shaquenia!

## 2018-12-27 ENCOUNTER — Ambulatory Visit: Payer: Medicare Other | Admitting: Family Medicine

## 2018-12-28 DIAGNOSIS — M179 Osteoarthritis of knee, unspecified: Secondary | ICD-10-CM | POA: Diagnosis not present

## 2018-12-28 DIAGNOSIS — Z4789 Encounter for other orthopedic aftercare: Secondary | ICD-10-CM | POA: Diagnosis not present

## 2018-12-29 ENCOUNTER — Ambulatory Visit: Payer: Medicare Other | Admitting: Family Medicine

## 2018-12-29 DIAGNOSIS — M1712 Unilateral primary osteoarthritis, left knee: Secondary | ICD-10-CM | POA: Diagnosis not present

## 2018-12-29 DIAGNOSIS — E785 Hyperlipidemia, unspecified: Secondary | ICD-10-CM | POA: Diagnosis not present

## 2018-12-29 DIAGNOSIS — E1122 Type 2 diabetes mellitus with diabetic chronic kidney disease: Secondary | ICD-10-CM | POA: Diagnosis not present

## 2018-12-29 DIAGNOSIS — T8453XD Infection and inflammatory reaction due to internal right knee prosthesis, subsequent encounter: Secondary | ICD-10-CM | POA: Diagnosis not present

## 2018-12-29 DIAGNOSIS — N183 Chronic kidney disease, stage 3 (moderate): Secondary | ICD-10-CM | POA: Diagnosis not present

## 2018-12-29 DIAGNOSIS — M10022 Idiopathic gout, left elbow: Secondary | ICD-10-CM | POA: Diagnosis not present

## 2018-12-29 DIAGNOSIS — Z86010 Personal history of colonic polyps: Secondary | ICD-10-CM | POA: Diagnosis not present

## 2018-12-29 DIAGNOSIS — I129 Hypertensive chronic kidney disease with stage 1 through stage 4 chronic kidney disease, or unspecified chronic kidney disease: Secondary | ICD-10-CM | POA: Diagnosis not present

## 2018-12-29 DIAGNOSIS — E1136 Type 2 diabetes mellitus with diabetic cataract: Secondary | ICD-10-CM | POA: Diagnosis not present

## 2018-12-29 DIAGNOSIS — Z7984 Long term (current) use of oral hypoglycemic drugs: Secondary | ICD-10-CM | POA: Diagnosis not present

## 2018-12-29 DIAGNOSIS — D509 Iron deficiency anemia, unspecified: Secondary | ICD-10-CM | POA: Diagnosis not present

## 2018-12-29 DIAGNOSIS — Z8673 Personal history of transient ischemic attack (TIA), and cerebral infarction without residual deficits: Secondary | ICD-10-CM | POA: Diagnosis not present

## 2018-12-29 DIAGNOSIS — Z9181 History of falling: Secondary | ICD-10-CM | POA: Diagnosis not present

## 2018-12-30 DIAGNOSIS — E1136 Type 2 diabetes mellitus with diabetic cataract: Secondary | ICD-10-CM | POA: Diagnosis not present

## 2018-12-30 DIAGNOSIS — Z9181 History of falling: Secondary | ICD-10-CM | POA: Diagnosis not present

## 2018-12-30 DIAGNOSIS — Z8673 Personal history of transient ischemic attack (TIA), and cerebral infarction without residual deficits: Secondary | ICD-10-CM | POA: Diagnosis not present

## 2018-12-30 DIAGNOSIS — M10022 Idiopathic gout, left elbow: Secondary | ICD-10-CM | POA: Diagnosis not present

## 2018-12-30 DIAGNOSIS — E785 Hyperlipidemia, unspecified: Secondary | ICD-10-CM | POA: Diagnosis not present

## 2018-12-30 DIAGNOSIS — T8453XD Infection and inflammatory reaction due to internal right knee prosthesis, subsequent encounter: Secondary | ICD-10-CM | POA: Diagnosis not present

## 2018-12-30 DIAGNOSIS — Z86010 Personal history of colonic polyps: Secondary | ICD-10-CM | POA: Diagnosis not present

## 2018-12-30 DIAGNOSIS — I129 Hypertensive chronic kidney disease with stage 1 through stage 4 chronic kidney disease, or unspecified chronic kidney disease: Secondary | ICD-10-CM | POA: Diagnosis not present

## 2018-12-30 DIAGNOSIS — N183 Chronic kidney disease, stage 3 (moderate): Secondary | ICD-10-CM | POA: Diagnosis not present

## 2018-12-30 DIAGNOSIS — E1122 Type 2 diabetes mellitus with diabetic chronic kidney disease: Secondary | ICD-10-CM | POA: Diagnosis not present

## 2018-12-30 DIAGNOSIS — Z7984 Long term (current) use of oral hypoglycemic drugs: Secondary | ICD-10-CM | POA: Diagnosis not present

## 2018-12-30 DIAGNOSIS — M1712 Unilateral primary osteoarthritis, left knee: Secondary | ICD-10-CM | POA: Diagnosis not present

## 2018-12-30 DIAGNOSIS — D509 Iron deficiency anemia, unspecified: Secondary | ICD-10-CM | POA: Diagnosis not present

## 2019-01-04 ENCOUNTER — Ambulatory Visit (INDEPENDENT_AMBULATORY_CARE_PROVIDER_SITE_OTHER): Payer: Medicare Other | Admitting: Family Medicine

## 2019-01-04 ENCOUNTER — Encounter: Payer: Self-pay | Admitting: Family Medicine

## 2019-01-04 ENCOUNTER — Ambulatory Visit: Payer: Medicare Other | Admitting: Infectious Disease

## 2019-01-04 ENCOUNTER — Encounter: Payer: Self-pay | Admitting: Infectious Disease

## 2019-01-04 VITALS — BP 137/78 | HR 64 | Temp 96.7°F | Ht 65.0 in | Wt 201.5 lb

## 2019-01-04 VITALS — BP 156/77 | HR 65 | Temp 97.4°F | Wt 201.8 lb

## 2019-01-04 DIAGNOSIS — B965 Pseudomonas (aeruginosa) (mallei) (pseudomallei) as the cause of diseases classified elsewhere: Secondary | ICD-10-CM | POA: Diagnosis not present

## 2019-01-04 DIAGNOSIS — Z96651 Presence of right artificial knee joint: Secondary | ICD-10-CM | POA: Diagnosis not present

## 2019-01-04 DIAGNOSIS — M00861 Arthritis due to other bacteria, right knee: Secondary | ICD-10-CM

## 2019-01-04 DIAGNOSIS — I10 Essential (primary) hypertension: Secondary | ICD-10-CM

## 2019-01-04 DIAGNOSIS — M25562 Pain in left knee: Secondary | ICD-10-CM

## 2019-01-04 DIAGNOSIS — T8453XD Infection and inflammatory reaction due to internal right knee prosthesis, subsequent encounter: Secondary | ICD-10-CM

## 2019-01-04 DIAGNOSIS — Z8739 Personal history of other diseases of the musculoskeletal system and connective tissue: Secondary | ICD-10-CM | POA: Insufficient documentation

## 2019-01-04 DIAGNOSIS — T8453XA Infection and inflammatory reaction due to internal right knee prosthesis, initial encounter: Secondary | ICD-10-CM

## 2019-01-04 DIAGNOSIS — E119 Type 2 diabetes mellitus without complications: Secondary | ICD-10-CM | POA: Diagnosis not present

## 2019-01-04 DIAGNOSIS — A498 Other bacterial infections of unspecified site: Secondary | ICD-10-CM

## 2019-01-04 DIAGNOSIS — M009 Pyogenic arthritis, unspecified: Secondary | ICD-10-CM

## 2019-01-04 DIAGNOSIS — D509 Iron deficiency anemia, unspecified: Secondary | ICD-10-CM | POA: Diagnosis not present

## 2019-01-04 HISTORY — DX: Personal history of other diseases of the musculoskeletal system and connective tissue: Z87.39

## 2019-01-04 LAB — CMP14+EGFR
ALT: 9 IU/L (ref 0–32)
AST: 14 IU/L (ref 0–40)
Albumin/Globulin Ratio: 1.2 (ref 1.2–2.2)
Albumin: 4 g/dL (ref 3.7–4.7)
Alkaline Phosphatase: 111 IU/L (ref 39–117)
BUN/Creatinine Ratio: 28 (ref 12–28)
BUN: 32 mg/dL — AB (ref 8–27)
Bilirubin Total: 0.5 mg/dL (ref 0.0–1.2)
CHLORIDE: 100 mmol/L (ref 96–106)
CO2: 23 mmol/L (ref 20–29)
Calcium: 9.5 mg/dL (ref 8.7–10.3)
Creatinine, Ser: 1.14 mg/dL — ABNORMAL HIGH (ref 0.57–1.00)
GFR calc Af Amer: 54 mL/min/{1.73_m2} — ABNORMAL LOW (ref >59)
GFR calc non Af Amer: 47 mL/min/{1.73_m2} — ABNORMAL LOW (ref >59)
GLUCOSE: 106 mg/dL — AB (ref 65–99)
Globulin, Total: 3.3 g/dL (ref 1.5–4.5)
Potassium: 4.4 mmol/L (ref 3.5–5.2)
Sodium: 138 mmol/L (ref 134–144)
Total Protein: 7.3 g/dL (ref 6.0–8.5)

## 2019-01-04 LAB — CBC WITH DIFFERENTIAL/PLATELET
BASOS ABS: 0 10*3/uL (ref 0.0–0.2)
Basos: 1 %
EOS (ABSOLUTE): 0.1 10*3/uL (ref 0.0–0.4)
Eos: 3 %
Hematocrit: 30.4 % — ABNORMAL LOW (ref 34.0–46.6)
Hemoglobin: 9.7 g/dL — ABNORMAL LOW (ref 11.1–15.9)
Immature Grans (Abs): 0 10*3/uL (ref 0.0–0.1)
Immature Granulocytes: 0 %
LYMPHS: 23 %
Lymphocytes Absolute: 1.1 10*3/uL (ref 0.7–3.1)
MCH: 24.9 pg — ABNORMAL LOW (ref 26.6–33.0)
MCHC: 31.9 g/dL (ref 31.5–35.7)
MCV: 78 fL — ABNORMAL LOW (ref 79–97)
Monocytes Absolute: 0.5 10*3/uL (ref 0.1–0.9)
Monocytes: 11 %
Neutrophils Absolute: 2.9 10*3/uL (ref 1.4–7.0)
Neutrophils: 62 %
Platelets: 372 10*3/uL (ref 150–450)
RBC: 3.89 x10E6/uL (ref 3.77–5.28)
RDW: 15.5 % — ABNORMAL HIGH (ref 11.7–15.4)
WBC: 4.7 10*3/uL (ref 3.4–10.8)

## 2019-01-04 MED ORDER — HYDRALAZINE HCL 100 MG PO TABS: ORAL_TABLET | ORAL | 1 refills | Status: DC

## 2019-01-04 MED ORDER — FUROSEMIDE 20 MG PO TABS: 20.0000 mg | ORAL_TABLET | Freq: Every day | ORAL | 5 refills | Status: DC

## 2019-01-04 MED ORDER — NEBIVOLOL HCL 20 MG PO TABS: ORAL_TABLET | ORAL | 1 refills | Status: DC

## 2019-01-04 MED ORDER — ATORVASTATIN CALCIUM 20 MG PO TABS: 20.0000 mg | ORAL_TABLET | Freq: Every evening | ORAL | 1 refills | Status: DC

## 2019-01-04 MED ORDER — OLMESARTAN MEDOXOMIL 40 MG PO TABS: 40.0000 mg | ORAL_TABLET | Freq: Every day | ORAL | 1 refills | Status: DC

## 2019-01-04 MED ORDER — TRADJENTA 5 MG PO TABS: 5.0000 mg | ORAL_TABLET | Freq: Every day | ORAL | 5 refills | Status: DC

## 2019-01-04 MED ORDER — AMLODIPINE BESYLATE 10 MG PO TABS: ORAL_TABLET | ORAL | 1 refills | Status: DC

## 2019-01-04 MED ORDER — ONETOUCH ULTRA BLUE VI STRP: ORAL_STRIP | 1 refills | Status: DC

## 2019-01-04 NOTE — Progress Notes (Signed)
Subjective:  Patient ID: Beth [name], female    DOB: May 23, 1946  Age: 73 y.o. MRN: [id number]  CC: Hospitalization Follow-up   HPI Beth [name] presents for Follow-up of diabetes. Patient does not check blood sugar at home Patient denies symptoms such as polyuria, polydipsia, excessive hunger, nausea No significant hypoglycemic spells noted. Medications as noted below. Taking them regularly without complication/adverse reaction being reported today.   Follow-up of hypertension. Patient has no history of headache chest pain or shortness of breath or recent cough. Patient also denies symptoms of TIA such as numbness weakness lateralizing. Patient checks  blood pressure at home and has not had any elevated readings recently. Patient denies side effects from his medication. States taking it regularly.   Patient is also under care of infectious disease and orthopedics for her right knee infection.  She had explantation of a total knee arthroplasty last fall.  Since that time she has been going through rehab and additionally taking oral antibiotics most recently Cipro.  She had antibiotic seed implants into the area when the prosthetic device was explanted.  She is now wearing a brace and learning to walk better with that.  She is seeing Dr. Drucilla Schmidt, her infectious disease doctor this evening.  She is apparently not taking any antibiotics at this time.  Is unclear whether he intended for that to be the case, however, since she is seeing him this afternoon that can be resolved rather quickly.  Of note is that she had been in a nursing facility until 6 days ago.  She was discharged to home based on Medicare decision making.  Depression screen [facility] 2/9 01/04/2019 06/07/2018 12/30/2017  Decreased Interest 0 0 0  Down, Depressed, Hopeless 0 0 0  PHQ - 2 Score 0 0 0    History Rhapsody has a past medical history of Anemia, Arthritis, Blood transfusion without reported diagnosis (2012), Cataract, CKD (chronic  kidney disease), stage III (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   She has a past surgical history that includes Carpal tunnel release (Right, 1983); colonscopy (June 21, 2014); nasal cauterization (2012); Total knee arthroplasty (Right, 07/03/2014); Patellar tendon repair (Right, 08/11/2014); Joint replacement (06/2014); I&D knee with poly exchange (Right, 10/02/2014); Excisional total knee arthroplasty with antibiotic spacers (Right, 02/16/2015); Reimplantation of total knee (Right, 05/23/2015); Abdominal hysterectomy (1983); Tubal ligation; Incision and drainage of wound (Right, 01/14/2017); and Excisional total knee arthroplasty with antibiotic spacers (Right, 11/03/2018).   Her family history includes Cancer in her mother; Diabetes in her son and son; Heart disease in her brother; Hypertension in her father; Kidney disease in her daughter; Ovarian cancer in her mother; Peripheral vascular disease in her father.She reports that she has never smoked. She has never used smokeless tobacco. She reports that she does not drink alcohol or use drugs.    ROS Review of Systems  Objective:  BP 137/78   Pulse 64   Temp (!) 96.7 F (35.9 C) (Oral)   Ht _0  (1.651 m)   Wt 201 lb 8 oz (91.4 kg)   BMI 33.53 kg/m   BP Readings from Last 3 Encounters:  01/04/19 (!) 156/77  01/04/19 137/78  12/15/18 135/75    Wt Readings from Last 3 Encounters:  01/04/19 201 lb 12.8 oz (91.5 kg)  01/04/19 201 lb 8 oz (  91.4 kg)  12/15/18 202 lb (91.6 kg)     Physical Exam    Assessment & Plan:   Beth [name] was seen today for hospitalization follow-up.  Diagnoses and all orders for this visit:  Controlled type 2 diabetes mellitus without  complication, without long-term current use of insulin (HCC) -     CBC with Differential/Platelet -     CMP14+EGFR -     Referral to Chronic Care Management Services  Essential hypertension -     Referral to Chronic Care Management Services  Iron deficiency anemia, unspecified iron deficiency anemia type -     Referral to Chronic Care Management Services  Infection of total right knee replacement, subsequent encounter -     Referral to Chronic Care Management Services  Other orders -     amLODipine (NORVASC) 10 MG tablet; TAKE 1 TABLET DAILY IN THE EVENING -     atorvastatin (LIPITOR) 20 MG tablet; Take 1 tablet (20 mg total) by mouth every evening. -     furosemide (LASIX) 20 MG tablet; Take 1 tablet (20 mg total) by mouth daily. -     hydrALAZINE (APRESOLINE) 100 MG tablet; TAKE ONE (1) TABLET THREE (3) TIMES EACH DAY -     Nebivolol HCl (BYSTOLIC) 20 MG TABS; TAKE ONE (1) TABLET EACH DAY -     olmesartan (BENICAR) 40 MG tablet; Take 1 tablet (40 mg total) by mouth daily. -     TRADJENTA 5 MG TABS tablet; Take 1 tablet (5 mg total) by mouth daily. -     ONE TOUCH ULTRA TEST test strip; Use to check glucose up to four times daily       I have discontinued Beth Alar. [name]'s GAVILAX, ferrous sulfate, sitaGLIPtin, and ciprofloxacin. I have also changed her furosemide, olmesartan, and TRADJENTA. Additionally, I am having her maintain her [facility] LANCETS 02R, acetaminophen, oxyCODONE, isosorbide dinitrate, amLODipine, atorvastatin, hydrALAZINE, Nebivolol HCl, and ONE TOUCH ULTRA TEST.  Allergies as of 01/04/2019      Reactions   Aleve [naproxen Sodium] Other (See Comments)   Heart races   Asa [aspirin] Other (See Comments)   Nose bleeding   Clonidine Derivatives Other (See Comments)   Dizziness and weakness   Shellfish Allergy Nausea And Vomiting      Medication List       Accurate as of January 04, 2019 11:02 PM. Always use your most recent med list.          acetaminophen 500 MG tablet Commonly known as:  TYLENOL Take 2 tablets (1,000 mg total) by mouth every 6 (six) hours as needed for mild pain.   amLODipine 10 MG tablet Commonly known as:  NORVASC TAKE 1 TABLET DAILY IN THE EVENING   atorvastatin 20 MG tablet Commonly known as:  LIPITOR Take 1 tablet (20 mg total) by mouth every evening.   furosemide 20 MG tablet Commonly known as:  LASIX Take 1 tablet (20 mg total) by mouth daily.   hydrALAZINE 100 MG tablet Commonly known as:  APRESOLINE TAKE ONE (1) TABLET THREE (3) TIMES EACH DAY   isosorbide dinitrate 10 MG tablet Commonly known as:  ISORDIL Take 1 tablet (10 mg total) by mouth 3 (three) times daily.   Nebivolol HCl 20 MG Tabs Commonly known as:  BYSTOLIC TAKE ONE (1) TABLET EACH DAY   olmesartan 40 MG tablet Commonly known as:  BENICAR Take 1 tablet (40 mg total) by mouth daily.   ONE TOUCH ULTRA TEST  test strip Generic drug:  glucose blood Use to check glucose up to four times daily   ONETOUCH DELICA LANCETS 59P Misc USE TO CHECK GLUCOSE UP TO  4 TIMES DAILY   oxyCODONE 5 MG immediate release tablet Commonly known as:  Oxy IR/ROXICODONE Take 1 tablet (5 mg total) by mouth every 6 (six) hours as needed for moderate pain (pain score 4-6).   TRADJENTA 5 MG Tabs tablet Generic drug:  linagliptin Take 1 tablet (5 mg total) by mouth daily.        Follow-up: Return in about 3 months (around 04/05/2019).  Claretta Fraise, M.D.

## 2019-01-04 NOTE — Progress Notes (Signed)
Subjective:  Chief complaint having more left-sided knee pain rather than pain over her prosthetic joint on the right  Patient ID: Beth [name], female    DOB: 1946/09/27, 73 y.o.   MRN: [id number]  HPI  Beth [name] is a 73  y.o. 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  She apparently originally had right knee total arthroplasty in July 2015.  In the interim she had an I&D in October 2015 with poly-exchange followed by antibiotics.  She then had in 2016 a right knee resection arthroplasty with placement of antibiotic spacers followed by antibiotics and followed by reimplantation of new prosthetic knee.  Unfortunately she is again had failure and of the attempts to eradicate infection in his knee and her prosthetic knee has been removed and antibiotic spacer placed  Is my understanding that no organism had been isolated in the past.  Does not appear that we were ever involved with her care before thing when I can see and less it was in the paper record I cannot see any notes over the last several years in epic.  This time she grew a pansensitive pseudomonal species.  She was discharged on cefepime but then admitted with renal failure and changed to oral ciprofloxacin she was having trouble with her PICC line as well.  Renal function had improved during her hospitalization and she just continued on ciprofloxacin in the skilled nursing facility.  Knee pain is better now versus when she was in the hospital.  She was due to complete the ciprofloxacin on 10 January.  Ever in the interim she was seen by me and we rechecked inflammatory markers which were still quite high sed rate still in the 70s and CRP had gone up into the 20s.  Therefore I continued her on oral ciprofloxacin in the nursing facility.  When she was discharged some skilled nurse facility on 20 January they unfortunately did not send her home with oral antibiotics.  Patient does not  experience any worsening of her knee pain she is to see Dr. Wynelle Link next week.  Past Medical History:  Diagnosis Date  . Anemia   . Arthritis    Knee both knees  . Blood transfusion without reported diagnosis 2012   anemia;pt denies transfusion stated was only on iron tablet  . Cataract    left  . CKD (chronic kidney disease), stage III ([place])   . CVA (cerebral infarction)    2006  . Diabetes mellitus without complication ([place])   . Family history of anesthesia complication    sister very slow to awaken after anesthesia;severe vomiting   . Gout    left elbow  . Heart murmur   . 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 08-09-14 Right eye  . Hyperlipidemia   . Hypertension   . Nocturia    3-4 times per night  . 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 Stroke [facility]) 2006   x 1 no deficits noted     Past Surgical History:  Procedure Laterality Date  . ABDOMINAL HYSTERECTOMY  1983  . CARPAL TUNNEL RELEASE Right 1983  . colonscopy  June 21, 2014  . EXCISIONAL TOTAL KNEE ARTHROPLASTY WITH ANTIBIOTIC SPACERS Right 02/16/2015   Procedure: RIGHT KNEE RESECTION ARTHROPLASTY WITH ANTIBIOTIC SPACERS;  Surgeon: [name], MD;  Location: [facility];  Service: Orthopedics;  Laterality: Right;  . EXCISIONAL TOTAL KNEE ARTHROPLASTY WITH ANTIBIOTIC SPACERS Right 11/03/2018   Procedure:  Right knee resection arthroplasty; antibiotic spacer;  Surgeon: [name], MD;  Location: [facility];  Service: Orthopedics;  Laterality: Right;  Adductor Block  . I&D KNEE WITH POLY EXCHANGE Right 10/02/2014   Procedure: IRRIGATION AND DEBRIDEMENT RIGHT KNEE WITH POLY EXCHANGE;  Surgeon: Gearlean Alf, MD;  Location: [facility];  Service: Orthopedics;  Laterality: Right;  . INCISION AND DRAINAGE OF WOUND Right 01/14/2017   Procedure: IRRIGATION AND DEBRIDEMENT WOUND;  Surgeon: [name], MD;  Location: [facility];  Service: Orthopedics;  Laterality: Right;  requests 17mins  . JOINT REPLACEMENT  06/2014    right knee  . nasal cauterization  2012  . PATELLAR TENDON REPAIR Right 08/11/2014   Procedure: RIGHT PATELLA TENDON REPAIR;  Surgeon: Gearlean Alf, MD;  Location: [facility];  Service: Orthopedics;  Laterality: Right;  . REIMPLANTATION OF TOTAL KNEE Right 05/23/2015   Procedure: RIGHT KNEE ARTHROPLASTY REIMPLANTATION;  Surgeon: [name], MD;  Location: [facility];  Service: Orthopedics;  Laterality: Right;  . TOTAL KNEE ARTHROPLASTY Right 07/03/2014   Procedure: RIGHT TOTAL KNEE ARTHROPLASTY;  Surgeon: Gearlean Alf, MD;  Location: [facility];  Service: Orthopedics;  Laterality: Right;  . TUBAL LIGATION      Family History  Problem Relation Age of Onset  . Ovarian cancer Mother   . Cancer Mother   . Diabetes Son   . Diabetes Son   . Peripheral vascular disease Father        with amputation of both legs  . Hypertension Father   . Heart disease Brother   . Kidney disease Daughter   . Colon cancer Neg Hx   . Esophageal cancer Neg Hx   . Stomach cancer Neg Hx   . Rectal cancer Neg Hx       Social History   Socioeconomic History  . Marital status: Single    Spouse name: Not on file  . Number of children: 7  . Years of education: 8th  . Highest education level: 8th grade  Occupational History    Employer: RETIRED  Social Needs  . Financial resource strain: Not hard at all  . Food insecurity:    Worry: Never true    Inability: Never true  . Transportation needs:    Medical: No    Non-medical: No  Tobacco Use  . Smoking status: Never Smoker  . Smokeless tobacco: Never Used  Substance and Sexual Activity  . Alcohol use: No  . Drug use: No  . Sexual activity: Never  Lifestyle  . Physical activity:    Days per week: 4 days    Minutes per session: 20 min  . Stress: Only a little  Relationships  . Social connections:    Talks on phone: More than three times a week    Gets together: More than three times a week    Attends religious service: More than 4 times per year     Active member of club or organization: No    Attends meetings of clubs or organizations: Never    Relationship status: Divorced  Other Topics Concern  . Not on file  Social History Narrative   Patient is widowed and lives with her granddaughter. She has 7 adult children and many grandchildren and great grandchildren. Patient is retired. 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     Allergies  Allergen Reactions  . Aleve [Naproxen Sodium] Other (See Comments)    Heart races  . Asa [Aspirin] Other (See  Comments)    Nose bleeding  . Clonidine Derivatives Other (See Comments)    Dizziness and weakness  . Shellfish Allergy Nausea And Vomiting     Current Outpatient Medications:  .  acetaminophen (TYLENOL) 500 MG tablet, Take 2 tablets (1,000 mg total) by mouth every 6 (six) hours as needed for mild pain., Disp: 90 tablet, Rfl: 1 .  amLODipine (NORVASC) 10 MG tablet, TAKE 1 TABLET DAILY IN THE EVENING, Disp: 90 tablet, Rfl: 1 .  atorvastatin (LIPITOR) 20 MG tablet, Take 1 tablet (20 mg total) by mouth every evening., Disp: 90 tablet, Rfl: 1 .  furosemide (LASIX) 20 MG tablet, Take 1 tablet (20 mg total) by mouth daily., Disp: 30 tablet, Rfl: 5 .  hydrALAZINE (APRESOLINE) 100 MG tablet, TAKE ONE (1) TABLET THREE (3) TIMES EACH DAY, Disp: 270 tablet, Rfl: 1 .  isosorbide dinitrate (ISORDIL) 10 MG tablet, Take 1 tablet (10 mg total) by mouth 3 (three) times daily., Disp: 90 tablet, Rfl: 0 .  Nebivolol HCl (BYSTOLIC) 20 MG TABS, TAKE ONE (1) TABLET EACH DAY, Disp: 90 each, Rfl: 1 .  olmesartan (BENICAR) 40 MG tablet, Take 1 tablet (40 mg total) by mouth daily., Disp: 90 tablet, Rfl: 1 .  ONE TOUCH ULTRA TEST test strip, Use to check glucose up to four times daily, Disp: 400 each, Rfl: 1 .  ONETOUCH DELICA LANCETS 16S MISC, USE TO CHECK GLUCOSE UP TO  4 TIMES DAILY, Disp: 400 each, Rfl: 0 .  oxyCODONE (OXY IR/ROXICODONE) 5 MG immediate release tablet, Take 1 tablet (5 mg total)  by mouth every 6 (six) hours as needed for moderate pain (pain score 4-6)., Disp: 5 tablet, Rfl: 0 .  TRADJENTA 5 MG TABS tablet, Take 1 tablet (5 mg total) by mouth daily., Disp: 30 tablet, Rfl: 5 No current facility-administered medications for this visit.   Facility-Administered Medications Ordered in Other Visits:  .  tranexamic acid (CYKLOKAPRON) 2,000 mg in sodium chloride 0.9 % 50 mL Topical Application, 0,630 mg, Topical, Once, Aluisio, Frank, MD  Review of Systems  Constitutional: Negative for activity change, appetite change, chills, diaphoresis, fatigue, fever and unexpected weight change.  HENT: Negative for congestion, rhinorrhea, sinus pressure, sneezing, sore throat and trouble swallowing.   Eyes: Negative for photophobia and visual disturbance.  Respiratory: Negative for cough, chest tightness, shortness of breath, wheezing and stridor.   Cardiovascular: Negative for chest pain, palpitations and leg swelling.  Gastrointestinal: Negative for abdominal distention, abdominal pain, anal bleeding, blood in stool, constipation, diarrhea, nausea and vomiting.  Genitourinary: Negative for difficulty urinating, dysuria, flank pain and hematuria.  Musculoskeletal: Positive for arthralgias. Negative for back pain, gait problem, joint swelling and myalgias.  Skin: Positive for wound. Negative for color change, pallor and rash.  Neurological: Negative for dizziness, tremors, weakness and light-headedness.  Hematological: Negative for adenopathy. Does not bruise/bleed easily.  Psychiatric/Behavioral: Negative for agitation, behavioral problems, confusion, decreased concentration, dysphoric mood and sleep disturbance. The patient is not nervous/anxious.        Objective:   Physical Exam Constitutional:      General: She is not in acute distress.    Appearance: She is not diaphoretic.  HENT:     Head: Normocephalic and atraumatic.     Right Ear: External ear normal.     Left Ear:  External ear normal.     Nose: Nose normal.  Eyes:     General: No scleral icterus.    Conjunctiva/sclera: Conjunctivae normal.  Neck:  Musculoskeletal: Normal range of motion and neck supple.  Cardiovascular:     Rate and Rhythm: Normal rate and regular rhythm.  Pulmonary:     Effort: Pulmonary effort is normal. No respiratory distress.  Abdominal:     General: There is no distension.  Musculoskeletal: Normal range of motion.        General: No tenderness.  Lymphadenopathy:     Cervical: No cervical adenopathy.  Skin:    General: Skin is warm and dry.     Coloration: Skin is not pale.     Findings: No erythema or rash.  Neurological:     General: No focal deficit present.     Mental Status: She is alert and oriented to person, place, and time.     Coordination: Coordination normal.  Psychiatric:        Mood and Affect: Mood normal.        Behavior: Behavior normal.        Thought Content: Thought content normal.        Judgment: Judgment normal.    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  Given that her inflammatory markers remained high I continued her on ciprofloxacin.  She does have a history of gout but no flares in the last 4 months no history of autoimmune conditions that should otherwise explain her elevated inflammatory markers.  For now would like to have her stay off ciprofloxacin since she will be seeing Dr. Maureen Ralphs next Tuesday  IF he felt need to break the joint check for cell count and differential Gram stain and cultures this would be higher yield than doing it on antibiotics.  I will check inflammatory labs today as well as a metabolic panel and CBC.  I will also plan on getting an  MRI of this joint with contrast kidney function permitting otherwise without contrast.  For now I think the approach we should take if she is doing better clinically and her inflammatory markers are growing in the right direction is to stop antibiotics and observe her.  She if she has worsening off antibiotics I would recommend reimaging her and aspirating her knee for cell count differential and culture.  I spent greater than 25 minutes with the patient including greater than 50% of time in face to face counsel of the patient and her son regard the nature of her infection and in coordination of her care.

## 2019-01-05 DIAGNOSIS — Z7984 Long term (current) use of oral hypoglycemic drugs: Secondary | ICD-10-CM | POA: Diagnosis not present

## 2019-01-05 DIAGNOSIS — N183 Chronic kidney disease, stage 3 (moderate): Secondary | ICD-10-CM | POA: Diagnosis not present

## 2019-01-05 DIAGNOSIS — D509 Iron deficiency anemia, unspecified: Secondary | ICD-10-CM | POA: Diagnosis not present

## 2019-01-05 DIAGNOSIS — T8453XD Infection and inflammatory reaction due to internal right knee prosthesis, subsequent encounter: Secondary | ICD-10-CM | POA: Diagnosis not present

## 2019-01-05 DIAGNOSIS — M1712 Unilateral primary osteoarthritis, left knee: Secondary | ICD-10-CM | POA: Diagnosis not present

## 2019-01-05 DIAGNOSIS — E1136 Type 2 diabetes mellitus with diabetic cataract: Secondary | ICD-10-CM | POA: Diagnosis not present

## 2019-01-05 DIAGNOSIS — Z9181 History of falling: Secondary | ICD-10-CM | POA: Diagnosis not present

## 2019-01-05 DIAGNOSIS — E1122 Type 2 diabetes mellitus with diabetic chronic kidney disease: Secondary | ICD-10-CM | POA: Diagnosis not present

## 2019-01-05 DIAGNOSIS — Z8673 Personal history of transient ischemic attack (TIA), and cerebral infarction without residual deficits: Secondary | ICD-10-CM | POA: Diagnosis not present

## 2019-01-05 DIAGNOSIS — Z86010 Personal history of colonic polyps: Secondary | ICD-10-CM | POA: Diagnosis not present

## 2019-01-05 DIAGNOSIS — E785 Hyperlipidemia, unspecified: Secondary | ICD-10-CM | POA: Diagnosis not present

## 2019-01-05 DIAGNOSIS — M10022 Idiopathic gout, left elbow: Secondary | ICD-10-CM | POA: Diagnosis not present

## 2019-01-05 DIAGNOSIS — I129 Hypertensive chronic kidney disease with stage 1 through stage 4 chronic kidney disease, or unspecified chronic kidney disease: Secondary | ICD-10-CM | POA: Diagnosis not present

## 2019-01-05 LAB — SEDIMENTATION RATE: Sed Rate: 67 mm/h — ABNORMAL HIGH (ref 0–30)

## 2019-01-05 LAB — BASIC METABOLIC PANEL
BUN/Creatinine Ratio: 30 (calc) — ABNORMAL HIGH (ref 6–22)
BUN: 31 mg/dL — ABNORMAL HIGH (ref 7–25)
CO2: 20 mmol/L (ref 20–32)
Calcium: 9.4 mg/dL (ref 8.6–10.4)
Chloride: 104 mmol/L (ref 98–110)
Creat: 1.02 mg/dL — ABNORMAL HIGH (ref 0.60–0.93)
Glucose, Bld: 183 mg/dL — ABNORMAL HIGH (ref 65–99)
Potassium: 4.2 mmol/L (ref 3.5–5.3)
Sodium: 140 mmol/L (ref 135–146)

## 2019-01-05 LAB — CBC WITH DIFFERENTIAL/PLATELET
Absolute Monocytes: 449 cells/uL (ref 200–950)
Basophils Absolute: 41 cells/uL (ref 0–200)
Basophils Relative: 0.8 %
Eosinophils Absolute: 148 cells/uL (ref 15–500)
Eosinophils Relative: 2.9 %
HCT: 33.4 % — ABNORMAL LOW (ref 35.0–45.0)
Hemoglobin: 10.3 g/dL — ABNORMAL LOW (ref 11.7–15.5)
LYMPHS ABS: 1168 {cells}/uL (ref 850–3900)
MCH: 24.8 pg — ABNORMAL LOW (ref 27.0–33.0)
MCHC: 30.8 g/dL — ABNORMAL LOW (ref 32.0–36.0)
MCV: 80.3 fL (ref 80.0–100.0)
MPV: 10 fL (ref 7.5–12.5)
Monocytes Relative: 8.8 %
Neutro Abs: 3295 cells/uL (ref 1500–7800)
Neutrophils Relative %: 64.6 %
Platelets: 400 10*3/uL (ref 140–400)
RBC: 4.16 10*6/uL (ref 3.80–5.10)
RDW: 15.3 % — ABNORMAL HIGH (ref 11.0–15.0)
Total Lymphocyte: 22.9 %
WBC: 5.1 10*3/uL (ref 3.8–10.8)

## 2019-01-05 LAB — C-REACTIVE PROTEIN: CRP: 10.4 mg/L — ABNORMAL HIGH (ref <8.0)

## 2019-01-05 NOTE — Progress Notes (Signed)
Hello Yerania,  Your lab result is normal.Some minor variations that are not significant are commonly marked abnormal, but do not represent any medical problem for you.  Best regards, Claretta Fraise, M.D.

## 2019-01-06 DIAGNOSIS — E785 Hyperlipidemia, unspecified: Secondary | ICD-10-CM | POA: Diagnosis not present

## 2019-01-06 DIAGNOSIS — Z9181 History of falling: Secondary | ICD-10-CM | POA: Diagnosis not present

## 2019-01-06 DIAGNOSIS — Z8673 Personal history of transient ischemic attack (TIA), and cerebral infarction without residual deficits: Secondary | ICD-10-CM | POA: Diagnosis not present

## 2019-01-06 DIAGNOSIS — M10022 Idiopathic gout, left elbow: Secondary | ICD-10-CM | POA: Diagnosis not present

## 2019-01-06 DIAGNOSIS — T8453XD Infection and inflammatory reaction due to internal right knee prosthesis, subsequent encounter: Secondary | ICD-10-CM | POA: Diagnosis not present

## 2019-01-06 DIAGNOSIS — M1712 Unilateral primary osteoarthritis, left knee: Secondary | ICD-10-CM | POA: Diagnosis not present

## 2019-01-06 DIAGNOSIS — D509 Iron deficiency anemia, unspecified: Secondary | ICD-10-CM | POA: Diagnosis not present

## 2019-01-06 DIAGNOSIS — I129 Hypertensive chronic kidney disease with stage 1 through stage 4 chronic kidney disease, or unspecified chronic kidney disease: Secondary | ICD-10-CM | POA: Diagnosis not present

## 2019-01-06 DIAGNOSIS — Z86010 Personal history of colonic polyps: Secondary | ICD-10-CM | POA: Diagnosis not present

## 2019-01-06 DIAGNOSIS — E1122 Type 2 diabetes mellitus with diabetic chronic kidney disease: Secondary | ICD-10-CM | POA: Diagnosis not present

## 2019-01-06 DIAGNOSIS — E1136 Type 2 diabetes mellitus with diabetic cataract: Secondary | ICD-10-CM | POA: Diagnosis not present

## 2019-01-06 DIAGNOSIS — N183 Chronic kidney disease, stage 3 (moderate): Secondary | ICD-10-CM | POA: Diagnosis not present

## 2019-01-06 DIAGNOSIS — Z7984 Long term (current) use of oral hypoglycemic drugs: Secondary | ICD-10-CM | POA: Diagnosis not present

## 2019-01-10 DIAGNOSIS — Z7984 Long term (current) use of oral hypoglycemic drugs: Secondary | ICD-10-CM | POA: Diagnosis not present

## 2019-01-10 DIAGNOSIS — E1122 Type 2 diabetes mellitus with diabetic chronic kidney disease: Secondary | ICD-10-CM | POA: Diagnosis not present

## 2019-01-10 DIAGNOSIS — Z9181 History of falling: Secondary | ICD-10-CM | POA: Diagnosis not present

## 2019-01-10 DIAGNOSIS — I129 Hypertensive chronic kidney disease with stage 1 through stage 4 chronic kidney disease, or unspecified chronic kidney disease: Secondary | ICD-10-CM | POA: Diagnosis not present

## 2019-01-10 DIAGNOSIS — M1712 Unilateral primary osteoarthritis, left knee: Secondary | ICD-10-CM | POA: Diagnosis not present

## 2019-01-10 DIAGNOSIS — E1136 Type 2 diabetes mellitus with diabetic cataract: Secondary | ICD-10-CM | POA: Diagnosis not present

## 2019-01-10 DIAGNOSIS — Z86010 Personal history of colonic polyps: Secondary | ICD-10-CM | POA: Diagnosis not present

## 2019-01-10 DIAGNOSIS — E785 Hyperlipidemia, unspecified: Secondary | ICD-10-CM | POA: Diagnosis not present

## 2019-01-10 DIAGNOSIS — M10022 Idiopathic gout, left elbow: Secondary | ICD-10-CM | POA: Diagnosis not present

## 2019-01-10 DIAGNOSIS — T8453XD Infection and inflammatory reaction due to internal right knee prosthesis, subsequent encounter: Secondary | ICD-10-CM | POA: Diagnosis not present

## 2019-01-10 DIAGNOSIS — D509 Iron deficiency anemia, unspecified: Secondary | ICD-10-CM | POA: Diagnosis not present

## 2019-01-10 DIAGNOSIS — Z8673 Personal history of transient ischemic attack (TIA), and cerebral infarction without residual deficits: Secondary | ICD-10-CM | POA: Diagnosis not present

## 2019-01-10 DIAGNOSIS — Z4789 Encounter for other orthopedic aftercare: Secondary | ICD-10-CM | POA: Diagnosis not present

## 2019-01-10 DIAGNOSIS — N183 Chronic kidney disease, stage 3 (moderate): Secondary | ICD-10-CM | POA: Diagnosis not present

## 2019-01-12 DIAGNOSIS — M10022 Idiopathic gout, left elbow: Secondary | ICD-10-CM | POA: Diagnosis not present

## 2019-01-12 DIAGNOSIS — M1712 Unilateral primary osteoarthritis, left knee: Secondary | ICD-10-CM | POA: Diagnosis not present

## 2019-01-12 DIAGNOSIS — N183 Chronic kidney disease, stage 3 (moderate): Secondary | ICD-10-CM | POA: Diagnosis not present

## 2019-01-12 DIAGNOSIS — E785 Hyperlipidemia, unspecified: Secondary | ICD-10-CM | POA: Diagnosis not present

## 2019-01-12 DIAGNOSIS — T8453XD Infection and inflammatory reaction due to internal right knee prosthesis, subsequent encounter: Secondary | ICD-10-CM | POA: Diagnosis not present

## 2019-01-12 DIAGNOSIS — E1122 Type 2 diabetes mellitus with diabetic chronic kidney disease: Secondary | ICD-10-CM | POA: Diagnosis not present

## 2019-01-12 DIAGNOSIS — D509 Iron deficiency anemia, unspecified: Secondary | ICD-10-CM | POA: Diagnosis not present

## 2019-01-12 DIAGNOSIS — Z86010 Personal history of colonic polyps: Secondary | ICD-10-CM | POA: Diagnosis not present

## 2019-01-12 DIAGNOSIS — E1136 Type 2 diabetes mellitus with diabetic cataract: Secondary | ICD-10-CM | POA: Diagnosis not present

## 2019-01-12 DIAGNOSIS — I129 Hypertensive chronic kidney disease with stage 1 through stage 4 chronic kidney disease, or unspecified chronic kidney disease: Secondary | ICD-10-CM | POA: Diagnosis not present

## 2019-01-12 DIAGNOSIS — Z7984 Long term (current) use of oral hypoglycemic drugs: Secondary | ICD-10-CM | POA: Diagnosis not present

## 2019-01-12 DIAGNOSIS — Z9181 History of falling: Secondary | ICD-10-CM | POA: Diagnosis not present

## 2019-01-12 DIAGNOSIS — Z8673 Personal history of transient ischemic attack (TIA), and cerebral infarction without residual deficits: Secondary | ICD-10-CM | POA: Diagnosis not present

## 2019-01-14 ENCOUNTER — Ambulatory Visit: Payer: Medicare Other | Admitting: *Deleted

## 2019-01-14 DIAGNOSIS — E1122 Type 2 diabetes mellitus with diabetic chronic kidney disease: Secondary | ICD-10-CM | POA: Diagnosis not present

## 2019-01-14 DIAGNOSIS — I129 Hypertensive chronic kidney disease with stage 1 through stage 4 chronic kidney disease, or unspecified chronic kidney disease: Secondary | ICD-10-CM | POA: Diagnosis not present

## 2019-01-14 DIAGNOSIS — I10 Essential (primary) hypertension: Secondary | ICD-10-CM

## 2019-01-14 DIAGNOSIS — E785 Hyperlipidemia, unspecified: Secondary | ICD-10-CM | POA: Diagnosis not present

## 2019-01-14 DIAGNOSIS — M1712 Unilateral primary osteoarthritis, left knee: Secondary | ICD-10-CM | POA: Diagnosis not present

## 2019-01-14 DIAGNOSIS — T8453XD Infection and inflammatory reaction due to internal right knee prosthesis, subsequent encounter: Secondary | ICD-10-CM | POA: Diagnosis not present

## 2019-01-14 DIAGNOSIS — Z7984 Long term (current) use of oral hypoglycemic drugs: Secondary | ICD-10-CM | POA: Diagnosis not present

## 2019-01-14 DIAGNOSIS — Z9181 History of falling: Secondary | ICD-10-CM | POA: Diagnosis not present

## 2019-01-14 DIAGNOSIS — E1136 Type 2 diabetes mellitus with diabetic cataract: Secondary | ICD-10-CM | POA: Diagnosis not present

## 2019-01-14 DIAGNOSIS — E119 Type 2 diabetes mellitus without complications: Secondary | ICD-10-CM

## 2019-01-14 DIAGNOSIS — M10022 Idiopathic gout, left elbow: Secondary | ICD-10-CM | POA: Diagnosis not present

## 2019-01-14 DIAGNOSIS — N183 Chronic kidney disease, stage 3 (moderate): Secondary | ICD-10-CM | POA: Diagnosis not present

## 2019-01-14 DIAGNOSIS — D509 Iron deficiency anemia, unspecified: Secondary | ICD-10-CM | POA: Diagnosis not present

## 2019-01-14 DIAGNOSIS — Z8673 Personal history of transient ischemic attack (TIA), and cerebral infarction without residual deficits: Secondary | ICD-10-CM | POA: Diagnosis not present

## 2019-01-14 DIAGNOSIS — Z86010 Personal history of colonic polyps: Secondary | ICD-10-CM | POA: Diagnosis not present

## 2019-01-14 NOTE — Patient Instructions (Signed)
Ms. Dimartino was given information about Chronic Care Management services today including:  1. CCM service includes personalized support from designated clinical staff supervised by her physician, including individualized plan of care and coordination with other care providers 2. 24/7 contact phone numbers for assistance for urgent and routine care needs. 3. Service will only be billed when office clinical staff spend 20 minutes or more in a month to coordinate care. 4. Only one practitioner may furnish and bill the service in a calendar month. 5. The patient may stop CCM services at any time (effective at the end of the month) by phone call to the office staff. 6. The patient will be responsible for cost sharing (co-pay) of up to 20% of the service fee (after annual deductible is met).  Patient did not agree to services and wishes to consider information provided before deciding about enrollment in CCM services.   The patient verbalized understanding of instructions provided today and declined a print copy of patient instruction materials.   Plan I will reach by out by telephone over the next 30 days. Patient has been provided contact information for the CCM team and has been advised to call with any health related questions or concerns.   Chong Sicilian, RN-BC, BSN Nurse Case Manager [facility] Ph: [phone]

## 2019-01-14 NOTE — Chronic Care Management (AMB) (Signed)
  Care Management Note   Beth [name] is a 73 y.o. year old female who sees Stacks, Cletus Gash, MD for primary care. Dr Livia Snellen asked the CCM team to consult the patient for assistance with chronic disease management related to diabetes, hypertension, and anemia. Referral was placed on 01/04/2019.  Review of patient status, including review of consultants reports, relevant laboratory and other test results, and collaboration with appropriate care team members and the patient's provider was performed as part of comprehensive patient evaluation and provision of chronic care management services. Telephone outreach to patient today to introduce CCM services.   I reached out to Beth [name] by phone today.   [name] was given information about Chronic Care Management services today including:  1. CCM service includes personalized support from designated clinical staff supervised by her physician, including individualized plan of care and coordination with other care providers 2. 24/7 contact phone numbers for assistance for urgent and routine care needs. 3. Service will only be billed when office clinical staff spend 20 minutes or more in a month to coordinate care. 4. Only one practitioner may furnish and bill the service in a calendar month. 5. The patient may stop CCM services at any time (effective at the end of the month) by phone call to the office staff. 6. The patient will be responsible for cost sharing (co-pay) of up to 20% of the service fee (after annual deductible is met).  Patient did not agree to services and wishes to consider information provided before deciding about enrollment in CCM services.    She is currently receiving home health services related to an infected artificial knee joint and is awaiting knee replacement surgery. She is receiving home health services from nursing and physical therapy. She expects PT to last about 2 to 3 more weeks and she would like to wait until then to  follow up on CCM services.   Follow Up Plan: The CM team will reach out to the patient again over the next 30 days.  and The patient has been provided with contact information for the chronic care management team and has been advised to call with any health related questions or concerns.    Chong Sicilian, RN-BC, BSN Nurse Case Manager [facility] Ph: [phone]

## 2019-01-17 DIAGNOSIS — Z86010 Personal history of colonic polyps: Secondary | ICD-10-CM | POA: Diagnosis not present

## 2019-01-17 DIAGNOSIS — I129 Hypertensive chronic kidney disease with stage 1 through stage 4 chronic kidney disease, or unspecified chronic kidney disease: Secondary | ICD-10-CM | POA: Diagnosis not present

## 2019-01-17 DIAGNOSIS — Z8673 Personal history of transient ischemic attack (TIA), and cerebral infarction without residual deficits: Secondary | ICD-10-CM | POA: Diagnosis not present

## 2019-01-17 DIAGNOSIS — Z9181 History of falling: Secondary | ICD-10-CM | POA: Diagnosis not present

## 2019-01-17 DIAGNOSIS — D509 Iron deficiency anemia, unspecified: Secondary | ICD-10-CM | POA: Diagnosis not present

## 2019-01-17 DIAGNOSIS — M1712 Unilateral primary osteoarthritis, left knee: Secondary | ICD-10-CM | POA: Diagnosis not present

## 2019-01-17 DIAGNOSIS — M00861 Arthritis due to other bacteria, right knee: Secondary | ICD-10-CM | POA: Diagnosis not present

## 2019-01-17 DIAGNOSIS — B965 Pseudomonas (aeruginosa) (mallei) (pseudomallei) as the cause of diseases classified elsewhere: Secondary | ICD-10-CM | POA: Diagnosis not present

## 2019-01-17 DIAGNOSIS — T8453XA Infection and inflammatory reaction due to internal right knee prosthesis, initial encounter: Secondary | ICD-10-CM | POA: Diagnosis not present

## 2019-01-17 DIAGNOSIS — E785 Hyperlipidemia, unspecified: Secondary | ICD-10-CM | POA: Diagnosis not present

## 2019-01-17 DIAGNOSIS — E1122 Type 2 diabetes mellitus with diabetic chronic kidney disease: Secondary | ICD-10-CM | POA: Diagnosis not present

## 2019-01-17 DIAGNOSIS — M10022 Idiopathic gout, left elbow: Secondary | ICD-10-CM | POA: Diagnosis not present

## 2019-01-17 DIAGNOSIS — H269 Unspecified cataract: Secondary | ICD-10-CM | POA: Diagnosis not present

## 2019-01-17 DIAGNOSIS — Z7984 Long term (current) use of oral hypoglycemic drugs: Secondary | ICD-10-CM | POA: Diagnosis not present

## 2019-01-17 DIAGNOSIS — L02415 Cutaneous abscess of right lower limb: Secondary | ICD-10-CM | POA: Diagnosis not present

## 2019-01-17 DIAGNOSIS — N183 Chronic kidney disease, stage 3 (moderate): Secondary | ICD-10-CM | POA: Diagnosis not present

## 2019-01-17 DIAGNOSIS — Z452 Encounter for adjustment and management of vascular access device: Secondary | ICD-10-CM | POA: Diagnosis not present

## 2019-01-19 DIAGNOSIS — M25561 Pain in right knee: Secondary | ICD-10-CM | POA: Diagnosis not present

## 2019-01-20 DIAGNOSIS — Z452 Encounter for adjustment and management of vascular access device: Secondary | ICD-10-CM | POA: Diagnosis not present

## 2019-01-20 DIAGNOSIS — Z9181 History of falling: Secondary | ICD-10-CM | POA: Diagnosis not present

## 2019-01-20 DIAGNOSIS — N183 Chronic kidney disease, stage 3 (moderate): Secondary | ICD-10-CM | POA: Diagnosis not present

## 2019-01-20 DIAGNOSIS — L02415 Cutaneous abscess of right lower limb: Secondary | ICD-10-CM | POA: Diagnosis not present

## 2019-01-20 DIAGNOSIS — B965 Pseudomonas (aeruginosa) (mallei) (pseudomallei) as the cause of diseases classified elsewhere: Secondary | ICD-10-CM | POA: Diagnosis not present

## 2019-01-20 DIAGNOSIS — E785 Hyperlipidemia, unspecified: Secondary | ICD-10-CM | POA: Diagnosis not present

## 2019-01-20 DIAGNOSIS — D509 Iron deficiency anemia, unspecified: Secondary | ICD-10-CM | POA: Diagnosis not present

## 2019-01-20 DIAGNOSIS — E1122 Type 2 diabetes mellitus with diabetic chronic kidney disease: Secondary | ICD-10-CM | POA: Diagnosis not present

## 2019-01-20 DIAGNOSIS — M10022 Idiopathic gout, left elbow: Secondary | ICD-10-CM | POA: Diagnosis not present

## 2019-01-20 DIAGNOSIS — Z8673 Personal history of transient ischemic attack (TIA), and cerebral infarction without residual deficits: Secondary | ICD-10-CM | POA: Diagnosis not present

## 2019-01-20 DIAGNOSIS — I129 Hypertensive chronic kidney disease with stage 1 through stage 4 chronic kidney disease, or unspecified chronic kidney disease: Secondary | ICD-10-CM | POA: Diagnosis not present

## 2019-01-20 DIAGNOSIS — Z86010 Personal history of colonic polyps: Secondary | ICD-10-CM | POA: Diagnosis not present

## 2019-01-20 DIAGNOSIS — H269 Unspecified cataract: Secondary | ICD-10-CM | POA: Diagnosis not present

## 2019-01-20 DIAGNOSIS — Z7984 Long term (current) use of oral hypoglycemic drugs: Secondary | ICD-10-CM | POA: Diagnosis not present

## 2019-01-20 DIAGNOSIS — M1712 Unilateral primary osteoarthritis, left knee: Secondary | ICD-10-CM | POA: Diagnosis not present

## 2019-01-20 DIAGNOSIS — T8453XA Infection and inflammatory reaction due to internal right knee prosthesis, initial encounter: Secondary | ICD-10-CM | POA: Diagnosis not present

## 2019-01-20 DIAGNOSIS — M00861 Arthritis due to other bacteria, right knee: Secondary | ICD-10-CM | POA: Diagnosis not present

## 2019-01-24 DIAGNOSIS — Z7984 Long term (current) use of oral hypoglycemic drugs: Secondary | ICD-10-CM | POA: Diagnosis not present

## 2019-01-24 DIAGNOSIS — Z9181 History of falling: Secondary | ICD-10-CM | POA: Diagnosis not present

## 2019-01-24 DIAGNOSIS — T8453XA Infection and inflammatory reaction due to internal right knee prosthesis, initial encounter: Secondary | ICD-10-CM | POA: Diagnosis not present

## 2019-01-24 DIAGNOSIS — E785 Hyperlipidemia, unspecified: Secondary | ICD-10-CM | POA: Diagnosis not present

## 2019-01-24 DIAGNOSIS — M10022 Idiopathic gout, left elbow: Secondary | ICD-10-CM | POA: Diagnosis not present

## 2019-01-24 DIAGNOSIS — I129 Hypertensive chronic kidney disease with stage 1 through stage 4 chronic kidney disease, or unspecified chronic kidney disease: Secondary | ICD-10-CM | POA: Diagnosis not present

## 2019-01-24 DIAGNOSIS — H269 Unspecified cataract: Secondary | ICD-10-CM | POA: Diagnosis not present

## 2019-01-24 DIAGNOSIS — B965 Pseudomonas (aeruginosa) (mallei) (pseudomallei) as the cause of diseases classified elsewhere: Secondary | ICD-10-CM | POA: Diagnosis not present

## 2019-01-24 DIAGNOSIS — L02415 Cutaneous abscess of right lower limb: Secondary | ICD-10-CM | POA: Diagnosis not present

## 2019-01-24 DIAGNOSIS — Z8673 Personal history of transient ischemic attack (TIA), and cerebral infarction without residual deficits: Secondary | ICD-10-CM | POA: Diagnosis not present

## 2019-01-24 DIAGNOSIS — E1122 Type 2 diabetes mellitus with diabetic chronic kidney disease: Secondary | ICD-10-CM | POA: Diagnosis not present

## 2019-01-24 DIAGNOSIS — Z452 Encounter for adjustment and management of vascular access device: Secondary | ICD-10-CM | POA: Diagnosis not present

## 2019-01-24 DIAGNOSIS — Z86010 Personal history of colonic polyps: Secondary | ICD-10-CM | POA: Diagnosis not present

## 2019-01-24 DIAGNOSIS — N183 Chronic kidney disease, stage 3 (moderate): Secondary | ICD-10-CM | POA: Diagnosis not present

## 2019-01-24 DIAGNOSIS — D509 Iron deficiency anemia, unspecified: Secondary | ICD-10-CM | POA: Diagnosis not present

## 2019-01-24 DIAGNOSIS — M1712 Unilateral primary osteoarthritis, left knee: Secondary | ICD-10-CM | POA: Diagnosis not present

## 2019-01-24 DIAGNOSIS — M00861 Arthritis due to other bacteria, right knee: Secondary | ICD-10-CM | POA: Diagnosis not present

## 2019-01-26 DIAGNOSIS — Z7984 Long term (current) use of oral hypoglycemic drugs: Secondary | ICD-10-CM | POA: Diagnosis not present

## 2019-01-26 DIAGNOSIS — D509 Iron deficiency anemia, unspecified: Secondary | ICD-10-CM | POA: Diagnosis not present

## 2019-01-26 DIAGNOSIS — Z9181 History of falling: Secondary | ICD-10-CM | POA: Diagnosis not present

## 2019-01-26 DIAGNOSIS — L02415 Cutaneous abscess of right lower limb: Secondary | ICD-10-CM | POA: Diagnosis not present

## 2019-01-26 DIAGNOSIS — H269 Unspecified cataract: Secondary | ICD-10-CM | POA: Diagnosis not present

## 2019-01-26 DIAGNOSIS — N183 Chronic kidney disease, stage 3 (moderate): Secondary | ICD-10-CM | POA: Diagnosis not present

## 2019-01-26 DIAGNOSIS — M1712 Unilateral primary osteoarthritis, left knee: Secondary | ICD-10-CM | POA: Diagnosis not present

## 2019-01-26 DIAGNOSIS — Z86010 Personal history of colonic polyps: Secondary | ICD-10-CM | POA: Diagnosis not present

## 2019-01-26 DIAGNOSIS — E1122 Type 2 diabetes mellitus with diabetic chronic kidney disease: Secondary | ICD-10-CM | POA: Diagnosis not present

## 2019-01-26 DIAGNOSIS — I129 Hypertensive chronic kidney disease with stage 1 through stage 4 chronic kidney disease, or unspecified chronic kidney disease: Secondary | ICD-10-CM | POA: Diagnosis not present

## 2019-01-26 DIAGNOSIS — Z452 Encounter for adjustment and management of vascular access device: Secondary | ICD-10-CM | POA: Diagnosis not present

## 2019-01-26 DIAGNOSIS — T8453XA Infection and inflammatory reaction due to internal right knee prosthesis, initial encounter: Secondary | ICD-10-CM | POA: Diagnosis not present

## 2019-01-26 DIAGNOSIS — B965 Pseudomonas (aeruginosa) (mallei) (pseudomallei) as the cause of diseases classified elsewhere: Secondary | ICD-10-CM | POA: Diagnosis not present

## 2019-01-26 DIAGNOSIS — Z8673 Personal history of transient ischemic attack (TIA), and cerebral infarction without residual deficits: Secondary | ICD-10-CM | POA: Diagnosis not present

## 2019-01-26 DIAGNOSIS — E785 Hyperlipidemia, unspecified: Secondary | ICD-10-CM | POA: Diagnosis not present

## 2019-01-26 DIAGNOSIS — M10022 Idiopathic gout, left elbow: Secondary | ICD-10-CM | POA: Diagnosis not present

## 2019-01-26 DIAGNOSIS — M00861 Arthritis due to other bacteria, right knee: Secondary | ICD-10-CM | POA: Diagnosis not present

## 2019-01-27 DIAGNOSIS — T8453XA Infection and inflammatory reaction due to internal right knee prosthesis, initial encounter: Secondary | ICD-10-CM | POA: Diagnosis not present

## 2019-01-27 DIAGNOSIS — Z452 Encounter for adjustment and management of vascular access device: Secondary | ICD-10-CM | POA: Diagnosis not present

## 2019-01-27 DIAGNOSIS — Z7984 Long term (current) use of oral hypoglycemic drugs: Secondary | ICD-10-CM | POA: Diagnosis not present

## 2019-01-27 DIAGNOSIS — B965 Pseudomonas (aeruginosa) (mallei) (pseudomallei) as the cause of diseases classified elsewhere: Secondary | ICD-10-CM | POA: Diagnosis not present

## 2019-01-27 DIAGNOSIS — N183 Chronic kidney disease, stage 3 (moderate): Secondary | ICD-10-CM | POA: Diagnosis not present

## 2019-01-27 DIAGNOSIS — M10022 Idiopathic gout, left elbow: Secondary | ICD-10-CM | POA: Diagnosis not present

## 2019-01-27 DIAGNOSIS — M00861 Arthritis due to other bacteria, right knee: Secondary | ICD-10-CM | POA: Diagnosis not present

## 2019-01-27 DIAGNOSIS — E785 Hyperlipidemia, unspecified: Secondary | ICD-10-CM | POA: Diagnosis not present

## 2019-01-27 DIAGNOSIS — Z9181 History of falling: Secondary | ICD-10-CM | POA: Diagnosis not present

## 2019-01-27 DIAGNOSIS — H269 Unspecified cataract: Secondary | ICD-10-CM | POA: Diagnosis not present

## 2019-01-27 DIAGNOSIS — M1712 Unilateral primary osteoarthritis, left knee: Secondary | ICD-10-CM | POA: Diagnosis not present

## 2019-01-27 DIAGNOSIS — L02415 Cutaneous abscess of right lower limb: Secondary | ICD-10-CM | POA: Diagnosis not present

## 2019-01-27 DIAGNOSIS — I129 Hypertensive chronic kidney disease with stage 1 through stage 4 chronic kidney disease, or unspecified chronic kidney disease: Secondary | ICD-10-CM | POA: Diagnosis not present

## 2019-01-27 DIAGNOSIS — D509 Iron deficiency anemia, unspecified: Secondary | ICD-10-CM | POA: Diagnosis not present

## 2019-01-27 DIAGNOSIS — Z8673 Personal history of transient ischemic attack (TIA), and cerebral infarction without residual deficits: Secondary | ICD-10-CM | POA: Diagnosis not present

## 2019-01-27 DIAGNOSIS — E1122 Type 2 diabetes mellitus with diabetic chronic kidney disease: Secondary | ICD-10-CM | POA: Diagnosis not present

## 2019-01-27 DIAGNOSIS — Z86010 Personal history of colonic polyps: Secondary | ICD-10-CM | POA: Diagnosis not present

## 2019-01-28 DIAGNOSIS — M179 Osteoarthritis of knee, unspecified: Secondary | ICD-10-CM | POA: Diagnosis not present

## 2019-01-28 DIAGNOSIS — Z4789 Encounter for other orthopedic aftercare: Secondary | ICD-10-CM | POA: Diagnosis not present

## 2019-01-31 ENCOUNTER — Telehealth: Payer: Medicare Other

## 2019-02-03 DIAGNOSIS — M25361 Other instability, right knee: Secondary | ICD-10-CM | POA: Diagnosis not present

## 2019-02-03 DIAGNOSIS — T8453XD Infection and inflammatory reaction due to internal right knee prosthesis, subsequent encounter: Secondary | ICD-10-CM | POA: Diagnosis not present

## 2019-02-03 DIAGNOSIS — M25561 Pain in right knee: Secondary | ICD-10-CM | POA: Diagnosis not present

## 2019-02-08 DIAGNOSIS — M549 Dorsalgia, unspecified: Secondary | ICD-10-CM | POA: Diagnosis not present

## 2019-02-08 DIAGNOSIS — L989 Disorder of the skin and subcutaneous tissue, unspecified: Secondary | ICD-10-CM | POA: Diagnosis not present

## 2019-02-08 DIAGNOSIS — Z4789 Encounter for other orthopedic aftercare: Secondary | ICD-10-CM | POA: Diagnosis not present

## 2019-02-15 ENCOUNTER — Ambulatory Visit: Payer: Medicare Other | Admitting: Family Medicine

## 2019-02-26 DIAGNOSIS — Z89512 Acquired absence of left leg below knee: Secondary | ICD-10-CM | POA: Diagnosis not present

## 2019-02-26 DIAGNOSIS — M1712 Unilateral primary osteoarthritis, left knee: Secondary | ICD-10-CM | POA: Diagnosis not present

## 2019-02-26 DIAGNOSIS — M00861 Arthritis due to other bacteria, right knee: Secondary | ICD-10-CM | POA: Diagnosis not present

## 2019-02-26 DIAGNOSIS — M179 Osteoarthritis of knee, unspecified: Secondary | ICD-10-CM | POA: Diagnosis not present

## 2019-03-01 ENCOUNTER — Ambulatory Visit: Payer: Medicare Other | Admitting: Infectious Disease

## 2019-03-07 ENCOUNTER — Other Ambulatory Visit: Payer: Self-pay | Admitting: Family Medicine

## 2019-03-11 DIAGNOSIS — Z4789 Encounter for other orthopedic aftercare: Secondary | ICD-10-CM | POA: Diagnosis not present

## 2019-03-11 DIAGNOSIS — M549 Dorsalgia, unspecified: Secondary | ICD-10-CM | POA: Diagnosis not present

## 2019-03-11 DIAGNOSIS — L989 Disorder of the skin and subcutaneous tissue, unspecified: Secondary | ICD-10-CM | POA: Diagnosis not present

## 2019-03-28 ENCOUNTER — Encounter: Payer: Self-pay | Admitting: Family Medicine

## 2019-03-28 ENCOUNTER — Telehealth: Payer: Self-pay | Admitting: *Deleted

## 2019-03-28 ENCOUNTER — Ambulatory Visit (INDEPENDENT_AMBULATORY_CARE_PROVIDER_SITE_OTHER): Payer: Medicare Other | Admitting: Family Medicine

## 2019-03-28 ENCOUNTER — Other Ambulatory Visit: Payer: Self-pay

## 2019-03-28 DIAGNOSIS — M009 Pyogenic arthritis, unspecified: Secondary | ICD-10-CM | POA: Diagnosis not present

## 2019-03-28 DIAGNOSIS — E119 Type 2 diabetes mellitus without complications: Secondary | ICD-10-CM | POA: Diagnosis not present

## 2019-03-28 DIAGNOSIS — I10 Essential (primary) hypertension: Secondary | ICD-10-CM

## 2019-03-28 MED ORDER — METHYLDOPA 250 MG PO TABS: 250.0000 mg | ORAL_TABLET | Freq: Three times a day (TID) | ORAL | 2 refills | Status: DC

## 2019-03-28 MED ORDER — DILTIAZEM HCL ER COATED BEADS 240 MG PO CP24: 240.0000 mg | ORAL_CAPSULE | Freq: Every day | ORAL | 1 refills | Status: DC

## 2019-03-28 MED ORDER — METFORMIN HCL ER 500 MG PO TB24: ORAL_TABLET | ORAL | 1 refills | Status: DC

## 2019-03-28 NOTE — Progress Notes (Signed)
Subjective:    Patient ID: Beth [name], female    DOB: 08-16-1946, 73 y.o.   MRN: [id number]  CC: Hypertension; Osteoarthritis; and Diabetes   HPI: Beth [name] is a 73 y.o. female presenting for Hypertension; Osteoarthritis; and Diabetes   Recheck of knees. Still hurting. Tolerable. Hopefully having surgery on it. Sees I.D. next month. Taking cipro Medicare visit to the home in March:  BP 132/90, HR 69, wt. 202 lbs.  Noted high risk for falling.  Did okay on clock test. A1c number was ?.6  Follow-up of diabetes. Patient does check blood sugar at home Patient denies symptoms such as polyuria, polydipsia, excessive hunger, nausea No significant hypoglycemic spells noted. Medications as noted below. Taking them regularly without complication/adverse reaction being reported today.  Pt. Checking glucose. Running 117- 130. Denies lows. Highest is 146.    Follow-up of hypertension. Patient has no history of headache chest pain or shortness of breath or recent cough. Patient also denies symptoms of TIA such as numbness weakness lateralizing. Patient checks  blood pressure at home and has not had any elevated readings recently. Patient denies side effects from his medication. States taking it regularly. BP at home running 850 - 277 systolic and 41O diastolic.   Depression screen [facility] 2/9 01/04/2019 06/07/2018 12/30/2017 12/18/2017 08/28/2017  Decreased Interest 0 0 0 0 0  Down, Depressed, Hopeless 0 0 0 0 0  PHQ - 2 Score 0 0 0 0 0     Relevant past medical, surgical, family and social history reviewed and updated as indicated.  Interim medical history since our last visit reviewed. Allergies and medications reviewed and updated.  ROS:  Review of Systems  Constitutional: Negative.   HENT: Negative for congestion.   Eyes: Negative for visual disturbance.  Respiratory: Negative for shortness of breath.   Cardiovascular: Negative for chest pain.  Gastrointestinal: Negative for  abdominal pain, constipation, diarrhea, nausea and vomiting.  Genitourinary: Negative for difficulty urinating.  Musculoskeletal: Positive for arthralgias. Negative for myalgias.  Neurological: Negative for headaches.  Psychiatric/Behavioral: Negative for sleep disturbance.     Social History   Tobacco Use  Smoking Status Never Smoker  Smokeless Tobacco Never Used       Objective:     Wt Readings from Last 3 Encounters:  01/04/19 201 lb 12.8 oz (91.5 kg)  01/04/19 201 lb 8 oz (91.4 kg)  12/15/18 202 lb (91.6 kg)     Exam deferred. Pt. Harboring due to COVID 19. Phone visit performed.   Assessment & Plan:    Robina was seen today for hypertension, osteoarthritis and diabetes.  Diagnoses and all orders for this visit:  Controlled type 2 diabetes mellitus without complication, without long-term current use of insulin (HCC)  Severe uncontrolled hypertension  Pyogenic arthritis of right knee joint, due to unspecified organism ([place])  Other orders -     metFORMIN (GLUCOPHAGE-XR) 500 MG 24 hr tablet; TAKE ONE (1) TABLET EACH DAY -     Discontinue: methyldopa (ALDOMET) 250 MG tablet; Take 1 tablet (250 mg total) by mouth 3 (three) times daily. -     diltiazem (CARDIZEM CD) 240 MG 24 hr capsule; Take 1 capsule (240 mg total) by mouth daily. For blood pressure   Virtual Visit via telephone Note  I discussed the limitations, risks, security and privacy concerns of performing an evaluation and management service by telephone and the availability of in person appointments. The patient expressed understanding and agreed to proceed. Pt.  Is at home. Dr. Livia Snellen is in his office.  Follow Up Instructions:   I discussed the assessment and treatment plan with the patient. The patient was provided an opportunity to ask questions and all were answered. The patient agreed with the plan and demonstrated an understanding of the instructions.   The patient was advised to call back or seek an  in-person evaluation if the symptoms worsen or if the condition fails to improve as anticipated.  Visit started: 3:20 Call ended:  3:51 Total minutes including chart review and phone contact time: 35   Follow up plan: Return in about 3 months (around 06/27/2019) for diabetes, hypertension.  Claretta Fraise, MD [facility] 03/28/2019, 5:32 PM

## 2019-03-29 DIAGNOSIS — M179 Osteoarthritis of knee, unspecified: Secondary | ICD-10-CM | POA: Diagnosis not present

## 2019-03-29 DIAGNOSIS — M00861 Arthritis due to other bacteria, right knee: Secondary | ICD-10-CM | POA: Diagnosis not present

## 2019-03-29 DIAGNOSIS — Z89512 Acquired absence of left leg below knee: Secondary | ICD-10-CM | POA: Diagnosis not present

## 2019-03-29 DIAGNOSIS — M1712 Unilateral primary osteoarthritis, left knee: Secondary | ICD-10-CM | POA: Diagnosis not present

## 2019-04-10 DIAGNOSIS — M549 Dorsalgia, unspecified: Secondary | ICD-10-CM | POA: Diagnosis not present

## 2019-04-10 DIAGNOSIS — Z4789 Encounter for other orthopedic aftercare: Secondary | ICD-10-CM | POA: Diagnosis not present

## 2019-04-10 DIAGNOSIS — L989 Disorder of the skin and subcutaneous tissue, unspecified: Secondary | ICD-10-CM | POA: Diagnosis not present

## 2019-04-13 ENCOUNTER — Encounter: Payer: Self-pay | Admitting: Infectious Disease

## 2019-04-13 ENCOUNTER — Ambulatory Visit (INDEPENDENT_AMBULATORY_CARE_PROVIDER_SITE_OTHER): Payer: Medicare Other | Admitting: Infectious Disease

## 2019-04-13 ENCOUNTER — Other Ambulatory Visit: Payer: Self-pay

## 2019-04-13 DIAGNOSIS — A498 Other bacterial infections of unspecified site: Secondary | ICD-10-CM | POA: Diagnosis not present

## 2019-04-13 DIAGNOSIS — M00861 Arthritis due to other bacteria, right knee: Secondary | ICD-10-CM

## 2019-04-13 MED ORDER — CIPROFLOXACIN HCL 500 MG PO TABS: 500.0000 mg | ORAL_TABLET | Freq: Two times a day (BID) | ORAL | 4 refills | Status: DC

## 2019-04-13 NOTE — Progress Notes (Signed)
Virtual Visit via Telephone Note  I connected with Beth [name] on 04/13/19 at 11:30 AM EDT by telephone and verified that I am speaking with the correct person using two identifiers.  Location: Patient: Home Provider: [facility]   I discussed the limitations, risks, security and privacy concerns of performing an evaluation and management service by telephone and the availability of in person appointments. I also discussed with the patient that there may be a patient responsible charge related to this service. The patient expressed understanding and agreed to proceed.   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 She apparently originally had right knee total arthroplasty in July 2015. In the interim she had an I&D in October 2015 with poly-exchange followed by antibiotics. She then had in 2016 a right knee resection arthroplasty with placement of antibiotic spacers followed by antibiotics and followed by reimplantation of new prosthetic knee. Unfortunately she is again had failure and of the attempts to eradicate infection in his knee and her prosthetic knee has been removed and antibiotic spacer placed  Is my understanding that no organism had been isolated in the past. Does not appear that we were ever involved with her care before thing when I can see and less it was in the paper record I cannot see any notes over the last several years in epic.  This time she grew a pansensitive pseudomonal species.  She was discharged on cefepime but then admitted with renal failure and changed to oral ciprofloxacin she was having trouble with her PICC line as well.  Renal function had improved during her hospitalization and she just continued on ciprofloxacin in the skilled nursing facility.  Knee pain is better now versus when she was in the hospital.  She was due to complete the ciprofloxacin on 10 January.  Ever in  the interim she was seen by me and we rechecked inflammatory markers which were still quite high sed rate still in the 70s and CRP had gone up into the 20s.  Therefore I continued her on oral ciprofloxacin in the nursing facility.  In her antimicrobial coverage when she was discharged but I resume the ciprofloxacin.  Them I last saw her she was off of it.  She then did see Dr. Reynaldo Minium however who placed her back on the ciprofloxacin.  She continues to have some knee pain but is much better than in the past she has some pain also distal to this her left knee which does not have present joint is been bothering her more so recently.  She has no fevers chills or systemic symptoms.  He lives alone except for a lady who comes to help prepare meals for her and clean her house.  She has been sheltering in place.   Observations/Objective:  Prosthetic joint infection    Assessment and Plan:  Prosthetic joint infection: Continue ciprofloxacin and conduct E visit as scheduled  Novel coronavirus 2019 prevention: Shelter in place  Follow Up Instructions:    I discussed the assessment and treatment plan with the patient. The patient was provided an opportunity to ask questions and all were answered. The patient agreed with the plan and demonstrated an understanding of the instructions.   The patient was advised to call back or seek an in-person evaluation if the symptoms worsen or if the condition fails to improve as anticipated.  I provided 22 minutes of non-face-to-face time during this encounter.  Alcide Evener, MD

## 2019-04-28 DIAGNOSIS — Z89512 Acquired absence of left leg below knee: Secondary | ICD-10-CM | POA: Diagnosis not present

## 2019-04-28 DIAGNOSIS — M179 Osteoarthritis of knee, unspecified: Secondary | ICD-10-CM | POA: Diagnosis not present

## 2019-04-28 DIAGNOSIS — M00861 Arthritis due to other bacteria, right knee: Secondary | ICD-10-CM | POA: Diagnosis not present

## 2019-04-28 DIAGNOSIS — M1712 Unilateral primary osteoarthritis, left knee: Secondary | ICD-10-CM | POA: Diagnosis not present

## 2019-05-11 DIAGNOSIS — M549 Dorsalgia, unspecified: Secondary | ICD-10-CM | POA: Diagnosis not present

## 2019-05-11 DIAGNOSIS — L989 Disorder of the skin and subcutaneous tissue, unspecified: Secondary | ICD-10-CM | POA: Diagnosis not present

## 2019-05-11 DIAGNOSIS — Z4789 Encounter for other orthopedic aftercare: Secondary | ICD-10-CM | POA: Diagnosis not present

## 2019-05-24 ENCOUNTER — Other Ambulatory Visit: Payer: Self-pay

## 2019-05-24 ENCOUNTER — Telehealth: Payer: Medicare Other

## 2019-05-29 DIAGNOSIS — M179 Osteoarthritis of knee, unspecified: Secondary | ICD-10-CM | POA: Diagnosis not present

## 2019-05-29 DIAGNOSIS — M00861 Arthritis due to other bacteria, right knee: Secondary | ICD-10-CM | POA: Diagnosis not present

## 2019-05-29 DIAGNOSIS — Z89512 Acquired absence of left leg below knee: Secondary | ICD-10-CM | POA: Diagnosis not present

## 2019-05-29 DIAGNOSIS — M1712 Unilateral primary osteoarthritis, left knee: Secondary | ICD-10-CM | POA: Diagnosis not present

## 2019-06-10 DIAGNOSIS — M549 Dorsalgia, unspecified: Secondary | ICD-10-CM | POA: Diagnosis not present

## 2019-06-10 DIAGNOSIS — L989 Disorder of the skin and subcutaneous tissue, unspecified: Secondary | ICD-10-CM | POA: Diagnosis not present

## 2019-06-10 DIAGNOSIS — Z4789 Encounter for other orthopedic aftercare: Secondary | ICD-10-CM | POA: Diagnosis not present

## 2019-06-16 DIAGNOSIS — E1142 Type 2 diabetes mellitus with diabetic polyneuropathy: Secondary | ICD-10-CM | POA: Diagnosis not present

## 2019-06-16 DIAGNOSIS — L84 Corns and callosities: Secondary | ICD-10-CM | POA: Diagnosis not present

## 2019-06-16 DIAGNOSIS — M79676 Pain in unspecified toe(s): Secondary | ICD-10-CM | POA: Diagnosis not present

## 2019-06-16 DIAGNOSIS — B351 Tinea unguium: Secondary | ICD-10-CM | POA: Diagnosis not present

## 2019-06-20 ENCOUNTER — Other Ambulatory Visit: Payer: Self-pay | Admitting: Family Medicine

## 2019-06-21 ENCOUNTER — Other Ambulatory Visit: Payer: Self-pay

## 2019-06-21 ENCOUNTER — Encounter: Payer: Self-pay | Admitting: Infectious Disease

## 2019-06-21 ENCOUNTER — Ambulatory Visit: Payer: Medicare Other | Admitting: Infectious Disease

## 2019-06-21 VITALS — BP 157/78 | HR 76 | Temp 98.0°F

## 2019-06-21 DIAGNOSIS — R1031 Right lower quadrant pain: Secondary | ICD-10-CM | POA: Insufficient documentation

## 2019-06-21 DIAGNOSIS — A498 Other bacterial infections of unspecified site: Secondary | ICD-10-CM

## 2019-06-21 DIAGNOSIS — T8453XD Infection and inflammatory reaction due to internal right knee prosthesis, subsequent encounter: Secondary | ICD-10-CM

## 2019-06-21 DIAGNOSIS — R1030 Lower abdominal pain, unspecified: Secondary | ICD-10-CM

## 2019-06-21 DIAGNOSIS — M25551 Pain in right hip: Secondary | ICD-10-CM | POA: Diagnosis not present

## 2019-06-21 HISTORY — DX: Lower abdominal pain, unspecified: R10.30

## 2019-06-21 NOTE — Progress Notes (Signed)
Subjective:  Chief complaint: Worsening right-sided groin pain    l Patient ID: Beth [name], female    DOB: 12-12-1945, 73 y.o.   MRN: [id number]  HPI  Beth [name] is a 73  y.o. 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  She apparently originally had right knee total arthroplasty in July 2015.  In the interim she had an I&D in October 2015 with poly-exchange followed by antibiotics.  She then had in 2016 a right knee resection arthroplasty with placement of antibiotic spacers followed by antibiotics and followed by reimplantation of new prosthetic knee.  Unfortunately she is again had failure and of the attempts to eradicate infection in his knee and her prosthetic knee has been removed and antibiotic spacer placed  Is my understanding that no organism had been isolated in the past.  Does not appear that we were ever involved with her care before thing when I can see and less it was in the paper record I cannot see any notes over the last several years in epic.  He subsequently grew a pansensitive pseudomonal species.  She was discharged on cefepime but then admitted with renal failure and changed to oral ciprofloxacin she was having trouble with her PICC line as well.  Renal function had improved during her hospitalization and she just continued on ciprofloxacin in the skilled nursing facility.  Knee pain is better now versus when she was in the hospital.  She was due to complete the ciprofloxacin on 10 January.  Ever in the interim she was seen by me and we rechecked inflammatory markers which were still quite high sed rate still in the 70s and CRP had gone up into the 20s.  Therefore I continued her on oral ciprofloxacin in the nursing facility.  When she was discharged some skilled nurse facility on 20 January they unfortunately did not send her home with oral antibiotics.  Since then she is continue oral ciprofloxacin.  However in the  interim she has developed some severe right-sided groin pain which is been gone for at least the last 4 to 6 weeks by her account but by her sons may be it been going on longer.  There is not been a fall but she is having to walk with a limp at present and it was not possible to get her onto the exam table today in the clinic  Past Medical History:  Diagnosis Date  . Anemia   . Arthritis    Knee both knees  . Blood transfusion without reported diagnosis 2012   anemia;pt denies transfusion stated was only on iron tablet  . Cataract    left  . CKD (chronic kidney disease), stage III ([place])   . CVA (cerebral infarction)    2006  . Diabetes mellitus without complication ([place])   . Family history of anesthesia complication    sister very slow to awaken after anesthesia;severe vomiting   . Gout    left elbow  . Heart murmur   . 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 08-09-14 Right eye  . History of gout 01/04/2019  . Hyperlipidemia   . Hypertension   . Nocturia    3-4 times per night  . 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 Stroke [facility]) 2006   x 1 no deficits noted     Past Surgical History:  Procedure Laterality Date  . ABDOMINAL HYSTERECTOMY  1983  . CARPAL TUNNEL  RELEASE Right 1983  . colonscopy  June 21, 2014  . EXCISIONAL TOTAL KNEE ARTHROPLASTY WITH ANTIBIOTIC SPACERS Right 02/16/2015   Procedure: RIGHT KNEE RESECTION ARTHROPLASTY WITH ANTIBIOTIC SPACERS;  Surgeon: [name], MD;  Location: [facility];  Service: Orthopedics;  Laterality: Right;  . EXCISIONAL TOTAL KNEE ARTHROPLASTY WITH ANTIBIOTIC SPACERS Right 11/03/2018   Procedure: Right knee resection arthroplasty; antibiotic spacer;  Surgeon: [name], MD;  Location: [facility];  Service: Orthopedics;  Laterality: Right;  Adductor Block  . I&D KNEE WITH POLY EXCHANGE Right 10/02/2014   Procedure: IRRIGATION AND DEBRIDEMENT RIGHT KNEE WITH POLY EXCHANGE;  Surgeon: Gearlean Alf, MD;  Location: [facility];   Service: Orthopedics;  Laterality: Right;  . INCISION AND DRAINAGE OF WOUND Right 01/14/2017   Procedure: IRRIGATION AND DEBRIDEMENT WOUND;  Surgeon: [name], MD;  Location: [facility];  Service: Orthopedics;  Laterality: Right;  requests 49mins  . JOINT REPLACEMENT  06/2014   right knee  . nasal cauterization  2012  . PATELLAR TENDON REPAIR Right 08/11/2014   Procedure: RIGHT PATELLA TENDON REPAIR;  Surgeon: Gearlean Alf, MD;  Location: [facility];  Service: Orthopedics;  Laterality: Right;  . REIMPLANTATION OF TOTAL KNEE Right 05/23/2015   Procedure: RIGHT KNEE ARTHROPLASTY REIMPLANTATION;  Surgeon: [name], MD;  Location: [facility];  Service: Orthopedics;  Laterality: Right;  . TOTAL KNEE ARTHROPLASTY Right 07/03/2014   Procedure: RIGHT TOTAL KNEE ARTHROPLASTY;  Surgeon: Gearlean Alf, MD;  Location: [facility];  Service: Orthopedics;  Laterality: Right;  . TUBAL LIGATION      Family History  Problem Relation Age of Onset  . Ovarian cancer Mother   . Cancer Mother   . Diabetes Son   . Diabetes Son   . Peripheral vascular disease Father        with amputation of both legs  . Hypertension Father   . Heart disease Brother   . Kidney disease Daughter   . Colon cancer Neg Hx   . Esophageal cancer Neg Hx   . Stomach cancer Neg Hx   . Rectal cancer Neg Hx       Social History   Socioeconomic History  . Marital status: Single    Spouse name: Not on file  . Number of children: 7  . Years of education: 8th  . Highest education level: 8th grade  Occupational History    Employer: RETIRED  Social Needs  . Financial resource strain: Not hard at all  . Food insecurity    Worry: Never true    Inability: Never true  . Transportation needs    Medical: No    Non-medical: No  Tobacco Use  . Smoking status: Never Smoker  . Smokeless tobacco: Never Used  Substance and Sexual Activity  . Alcohol use: No  . Drug use: No  . Sexual activity: Never  Lifestyle  . Physical activity     Days per week: 4 days    Minutes per session: 20 min  . Stress: Only a little  Relationships  . Social connections    Talks on phone: More than three times a week    Gets together: More than three times a week    Attends religious service: More than 4 times per year    Active member of club or organization: No    Attends meetings of clubs or organizations: Never    Relationship status: Divorced  Other Topics Concern  . Not on file  Social History Narrative  Patient is widowed and lives with her granddaughter. She has 7 adult children and many grandchildren and great grandchildren. Patient is retired. 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     Allergies  Allergen Reactions  . Aleve [Naproxen Sodium] Other (See Comments)    Heart races  . Asa [Aspirin] Other (See Comments)    Nose bleeding  . Clonidine Derivatives Other (See Comments)    Dizziness and weakness  . Shellfish Allergy Nausea And Vomiting     Current Outpatient Medications:  .  acetaminophen (TYLENOL) 500 MG tablet, Take 2 tablets (1,000 mg total) by mouth every 6 (six) hours as needed for mild pain., Disp: 90 tablet, Rfl: 1 .  amLODipine (NORVASC) 10 MG tablet, TAKE 1 TABLET DAILY IN THE EVENING, Disp: 90 tablet, Rfl: 1 .  atorvastatin (LIPITOR) 20 MG tablet, Take 1 tablet (20 mg total) by mouth every evening., Disp: 90 tablet, Rfl: 1 .  ciprofloxacin (CIPRO) 500 MG tablet, Take 1 tablet (500 mg total) by mouth 2 (two) times daily., Disp: 60 tablet, Rfl: 4 .  diltiazem (CARDIZEM CD) 240 MG 24 hr capsule, Take 1 capsule (240 mg total) by mouth daily. For blood pressure, Disp: 90 capsule, Rfl: 1 .  furosemide (LASIX) 20 MG tablet, Take 1 tablet (20 mg total) by mouth daily., Disp: 30 tablet, Rfl: 5 .  hydrALAZINE (APRESOLINE) 100 MG tablet, TAKE ONE (1) TABLET THREE (3) TIMES EACH DAY, Disp: 270 tablet, Rfl: 1 .  isosorbide dinitrate (ISORDIL) 10 MG tablet, TAKE ONE (1) TABLET THREE (3) TIMES EACH  DAY, Disp: 90 tablet, Rfl: 0 .  metFORMIN (GLUCOPHAGE-XR) 500 MG 24 hr tablet, TAKE ONE (1) TABLET EACH DAY, Disp: 90 tablet, Rfl: 1 .  Nebivolol HCl (BYSTOLIC) 20 MG TABS, TAKE ONE (1) TABLET EACH DAY, Disp: 90 each, Rfl: 1 .  olmesartan (BENICAR) 40 MG tablet, Take 1 tablet (40 mg total) by mouth daily., Disp: 90 tablet, Rfl: 1 .  ONE TOUCH ULTRA TEST test strip, Use to check glucose up to four times daily, Disp: 400 each, Rfl: 1 .  ONETOUCH DELICA LANCETS 87O MISC, USE TO CHECK GLUCOSE UP TO  4 TIMES DAILY, Disp: 400 each, Rfl: 0 .  oxyCODONE (OXY IR/ROXICODONE) 5 MG immediate release tablet, Take 1 tablet (5 mg total) by mouth every 6 (six) hours as needed for moderate pain (pain score 4-6)., Disp: 5 tablet, Rfl: 0 .  TRADJENTA 5 MG TABS tablet, Take 1 tablet (5 mg total) by mouth daily., Disp: 30 tablet, Rfl: 5 No current facility-administered medications for this visit.   Facility-Administered Medications Ordered in Other Visits:  .  tranexamic acid (CYKLOKAPRON) 2,000 mg in sodium chloride 0.9 % 50 mL Topical Application, 6,767 mg, Topical, Once, Aluisio, Frank, MD  Review of Systems  Constitutional: Negative for activity change, appetite change, chills, diaphoresis, fatigue, fever and unexpected weight change.  HENT: Negative for congestion, rhinorrhea, sinus pressure, sneezing, sore throat and trouble swallowing.   Eyes: Negative for photophobia and visual disturbance.  Respiratory: Negative for cough, chest tightness, shortness of breath, wheezing and stridor.   Cardiovascular: Negative for chest pain, palpitations and leg swelling.  Gastrointestinal: Negative for abdominal distention, abdominal pain, anal bleeding, blood in stool, constipation, diarrhea, nausea and vomiting.  Genitourinary: Negative for difficulty urinating, dysuria, flank pain and hematuria.  Musculoskeletal: Positive for arthralgias and gait problem. Negative for back pain, joint swelling and myalgias.  Skin:  Negative for color change, pallor and rash.  Neurological: Negative for dizziness, tremors, weakness and light-headedness.  Hematological: Negative for adenopathy. Does not bruise/bleed easily.  Psychiatric/Behavioral: Negative for agitation, behavioral problems, confusion, decreased concentration, dysphoric mood and sleep disturbance. The patient is not nervous/anxious.        Objective:   Physical Exam Constitutional:      General: She is not in acute distress.    Appearance: She is not diaphoretic.  HENT:     Head: Normocephalic and atraumatic.     Right Ear: External ear normal.     Left Ear: External ear normal.     Nose: Nose normal.  Eyes:     General: No scleral icterus.    Conjunctiva/sclera: Conjunctivae normal.  Neck:     Musculoskeletal: Normal range of motion and neck supple.  Cardiovascular:     Rate and Rhythm: Normal rate and regular rhythm.  Pulmonary:     Effort: Pulmonary effort is normal. No respiratory distress.  Abdominal:     General: There is no distension.  Musculoskeletal:        General: No tenderness.     Right hip: She exhibits decreased range of motion.  Lymphadenopathy:     Cervical: No cervical adenopathy.  Skin:    General: Skin is warm and dry.     Coloration: Skin is not pale.     Findings: No erythema or rash.  Neurological:     General: No focal deficit present.     Mental Status: She is alert and oriented to person, place, and time.     Coordination: Coordination normal.  Psychiatric:        Attention and Perception: Attention normal.        Mood and Affect: Mood is anxious.        Behavior: Behavior normal.        Thought Content: Thought content normal.        Judgment: Judgment normal.    Right leg with brace  Was not able to elevate her right leg much at all without severe groin pain being induced    Assessment & Plan:   Right groin pain: I am ordering an MRI of her right hip.  We will also check inflammatory markers as  below I have encouraged her to be seen by Dr. 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  I spent greater than 25 minutes with the patient including greater than 50% of time in face to face counsel of the patient and her son regarding the work-up of her right groin pain the needs for laboratory monitoring and desire for now to continue her on oral ciprofloxacin and and and in coordination of her care. Marland Kitchen

## 2019-06-22 ENCOUNTER — Other Ambulatory Visit: Payer: Self-pay

## 2019-06-22 LAB — BASIC METABOLIC PANEL WITH GFR
BUN/Creatinine Ratio: 30 (calc) — ABNORMAL HIGH (ref 6–22)
BUN: 31 mg/dL — ABNORMAL HIGH (ref 7–25)
CO2: 25 mmol/L (ref 20–32)
Calcium: 9.6 mg/dL (ref 8.6–10.4)
Chloride: 106 mmol/L (ref 98–110)
Creat: 1.03 mg/dL — ABNORMAL HIGH (ref 0.60–0.93)
GFR, Est African American: 62 mL/min/{1.73_m2} (ref >=60)
GFR, Est Non African American: 53 mL/min/{1.73_m2} — ABNORMAL LOW (ref >=60)
Glucose, Bld: 179 mg/dL — ABNORMAL HIGH (ref 65–99)
Potassium: 4.2 mmol/L (ref 3.5–5.3)
Sodium: 139 mmol/L (ref 135–146)

## 2019-06-22 LAB — CBC WITH DIFFERENTIAL/PLATELET
Absolute Monocytes: 512 cells/uL (ref 200–950)
Basophils Absolute: 39 cells/uL (ref 0–200)
Basophils Relative: 0.7 %
Eosinophils Absolute: 198 cells/uL (ref 15–500)
Eosinophils Relative: 3.6 %
HCT: 35 % (ref 35.0–45.0)
Hemoglobin: 10.9 g/dL — ABNORMAL LOW (ref 11.7–15.5)
Lymphs Abs: 1188 cells/uL (ref 850–3900)
MCH: 24.9 pg — ABNORMAL LOW (ref 27.0–33.0)
MCHC: 31.1 g/dL — ABNORMAL LOW (ref 32.0–36.0)
MCV: 79.9 fL — ABNORMAL LOW (ref 80.0–100.0)
MPV: 9.8 fL (ref 7.5–12.5)
Monocytes Relative: 9.3 %
Neutro Abs: 3564 cells/uL (ref 1500–7800)
Neutrophils Relative %: 64.8 %
Platelets: 345 10*3/uL (ref 140–400)
RBC: 4.38 10*6/uL (ref 3.80–5.10)
RDW: 15 % (ref 11.0–15.0)
Total Lymphocyte: 21.6 %
WBC: 5.5 10*3/uL (ref 3.8–10.8)

## 2019-06-22 LAB — C-REACTIVE PROTEIN: CRP: 13.1 mg/L — ABNORMAL HIGH (ref <8.0)

## 2019-06-22 LAB — SEDIMENTATION RATE: Sed Rate: 43 mm/h — ABNORMAL HIGH (ref 0–30)

## 2019-06-22 NOTE — Patient Outreach (Signed)
[place] [facility]) Care Management  06/22/2019  BENNETT VANSCYOC 05/09/1946 [id number]   Medication Adherence call to Mrs. Chales Abrahams [facility] Identifiers Verify spoke with patient she is past due on Atorvastatin 20 mg patient explain she is taking 1 tablet daily and has medication until the end of the month and will order it then. Mrs. Bramblett is showing past due under [place].   [place] Management Direct Dial [phone]  Fax [phone] [email or web address]

## 2019-06-28 ENCOUNTER — Other Ambulatory Visit: Payer: Self-pay | Admitting: Family Medicine

## 2019-06-28 DIAGNOSIS — M179 Osteoarthritis of knee, unspecified: Secondary | ICD-10-CM | POA: Diagnosis not present

## 2019-06-28 DIAGNOSIS — M00861 Arthritis due to other bacteria, right knee: Secondary | ICD-10-CM | POA: Diagnosis not present

## 2019-06-28 DIAGNOSIS — M1712 Unilateral primary osteoarthritis, left knee: Secondary | ICD-10-CM | POA: Diagnosis not present

## 2019-06-28 DIAGNOSIS — Z89512 Acquired absence of left leg below knee: Secondary | ICD-10-CM | POA: Diagnosis not present

## 2019-07-14 DIAGNOSIS — E119 Type 2 diabetes mellitus without complications: Secondary | ICD-10-CM | POA: Diagnosis not present

## 2019-07-16 ENCOUNTER — Ambulatory Visit
Admission: RE | Admit: 2019-07-16 | Discharge: 2019-07-16 | Disposition: A | Discharge status: Home / Self Care | Payer: Medicare Other | Source: Ambulatory Visit | Attending: Infectious Disease | Admitting: Infectious Disease

## 2019-07-16 ENCOUNTER — Other Ambulatory Visit: Payer: Self-pay

## 2019-07-16 DIAGNOSIS — T8453XD Infection and inflammatory reaction due to internal right knee prosthesis, subsequent encounter: Secondary | ICD-10-CM

## 2019-07-18 ENCOUNTER — Telehealth: Payer: Self-pay

## 2019-07-18 ENCOUNTER — Other Ambulatory Visit: Payer: Self-pay | Admitting: Family Medicine

## 2019-07-18 NOTE — Telephone Encounter (Signed)
Thanks Shaquenia!

## 2019-07-18 NOTE — Telephone Encounter (Signed)
Called patient and made her aware to contact PCP to medication prior to MRI for pain/agitation. Patient voiced understanding and will give [facility] a return call once she has contacted PCP for orders to reschedule MRI. Eugenia Mcalpine, LPN

## 2019-07-18 NOTE — Telephone Encounter (Signed)
Patient states she was scheduled to have MRI to right hip on 8/8. Patient was not able to complete due to being in pain. (Patient states the nurse says she was agitated)  Patient request pain medication prior to next rescheduled MRI. Routing to provider for advise.  Eugenia Mcalpine, LPN

## 2019-07-18 NOTE — Telephone Encounter (Signed)
Please advise 

## 2019-07-18 NOTE — Telephone Encounter (Signed)
I would see if PCP can order pain meds I d not do this for pts in [facility]

## 2019-07-19 ENCOUNTER — Telehealth: Payer: Self-pay | Admitting: Family Medicine

## 2019-07-19 ENCOUNTER — Other Ambulatory Visit: Payer: Self-pay | Admitting: Family Medicine

## 2019-07-19 MED ORDER — TRAMADOL HCL 50 MG PO TABS: 50.0000 mg | ORAL_TABLET | Freq: Four times a day (QID) | ORAL | 0 refills | Status: AC

## 2019-07-19 NOTE — Telephone Encounter (Signed)
I sent in the requested prescription 

## 2019-07-19 NOTE — Telephone Encounter (Signed)
Please review and advise.

## 2019-07-20 NOTE — Telephone Encounter (Signed)
Follow up call to patient regarding pain medication prior to MRI. Patient states her PCP is providing medication prior to MRI. [facility] scheduling coordinator will reach out to patient for rescheduling MRI.  Beth Mcalpine, LPN

## 2019-07-20 NOTE — Telephone Encounter (Signed)
Patient aware, script is ready. 

## 2019-07-22 ENCOUNTER — Other Ambulatory Visit: Payer: Self-pay | Admitting: Infectious Disease

## 2019-07-22 DIAGNOSIS — T8453XD Infection and inflammatory reaction due to internal right knee prosthesis, subsequent encounter: Secondary | ICD-10-CM

## 2019-07-22 NOTE — Telephone Encounter (Signed)
I put a new order for the MRI in the computer

## 2019-07-22 NOTE — Progress Notes (Signed)
Ordering MRI right hip again

## 2019-07-23 ENCOUNTER — Other Ambulatory Visit: Payer: Self-pay

## 2019-07-23 ENCOUNTER — Ambulatory Visit
Admission: RE | Admit: 2019-07-23 | Discharge: 2019-07-23 | Disposition: A | Discharge status: Home / Self Care | Payer: Medicare Other | Source: Ambulatory Visit | Attending: Infectious Disease | Admitting: Infectious Disease

## 2019-07-23 DIAGNOSIS — T8453XD Infection and inflammatory reaction due to internal right knee prosthesis, subsequent encounter: Secondary | ICD-10-CM

## 2019-07-23 DIAGNOSIS — M1612 Unilateral primary osteoarthritis, left hip: Secondary | ICD-10-CM | POA: Diagnosis not present

## 2019-07-23 DIAGNOSIS — M1611 Unilateral primary osteoarthritis, right hip: Secondary | ICD-10-CM | POA: Diagnosis not present

## 2019-07-23 MED ORDER — GADOBENATE DIMEGLUMINE 529 MG/ML IV SOLN
20.0000 mL | Freq: Once | INTRAVENOUS | Status: AC | PRN
  Administered 2019-07-23: 14:00:00 20 mL via INTRAVENOUS

## 2019-07-25 NOTE — Telephone Encounter (Signed)
Actually completed her MRI over the weekend and the imaging did not show evidence of infection.

## 2019-07-26 ENCOUNTER — Ambulatory Visit (INDEPENDENT_AMBULATORY_CARE_PROVIDER_SITE_OTHER): Payer: Medicare Other | Admitting: Infectious Diseases

## 2019-07-26 ENCOUNTER — Encounter: Payer: Self-pay | Admitting: Infectious Diseases

## 2019-07-26 ENCOUNTER — Other Ambulatory Visit: Payer: Self-pay

## 2019-07-26 VITALS — BP 174/77 | HR 77 | Temp 98.3°F

## 2019-07-26 DIAGNOSIS — A498 Other bacterial infections of unspecified site: Secondary | ICD-10-CM | POA: Diagnosis not present

## 2019-07-26 DIAGNOSIS — T8453XD Infection and inflammatory reaction due to internal right knee prosthesis, subsequent encounter: Secondary | ICD-10-CM | POA: Diagnosis not present

## 2019-07-26 DIAGNOSIS — R1031 Right lower quadrant pain: Secondary | ICD-10-CM | POA: Diagnosis not present

## 2019-07-26 NOTE — Progress Notes (Signed)
Patient: Beth [name]  DOB: 1946/08/27 MRN: [id number] PCP: Claretta Fraise, MD  Orthopedic Team: Dr. 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 female whom has been following with Dr. 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 Original arthroplasty was in July 2015 which required I&D for post op infection in October 2015 with poly-liner exchange and prolonged antibiotics (intraop was negative for any pathogen per chart review). Repeat knee resection arthroplasty with placement of spacer in 2016. Unfortunately again required removal of knee hardware again with repeat I&D of chronic osteomyelitis November 03, 2018 for recurrent infection and has had an antibiotic spacer ever since. Intraoperative cultures revealed a sensitive pseudomonas pathogen for which she was treated with 6 weeks of IV cefepime then transitioned to PO ciprofloxacin for months due to ongoingly elevated inflammatory markers. Prior to her recurrent infection in November she had a normal CRP; since surgery it has been 29 > 10 > 13 over the last 6 months.   At her last office visit 1 month ago she was concerned about fairly sudden onset unexplained right groin/hip pain. MRI of the hip was obtained to ensure no ascending infection involving hip joint given her history of significant infection in the past. She and her son, [name] are here today and would like to know  the results of the MRI and would like to know the next steps for re-implantation of her knee. She has been taking cipro 500 mg BID PO correctly without missed doses since last OV. Her knee has been stable without any increased pain. She has not been back to see Dr. Maureen Ralphs d/t COVID pandemic. She ambulates in the home with walking aids and non-weight bearing on the right leg with use of orthopedic soft immobilizer. She has not had any side effects that she is aware of from the cipro.    Review of Systems  All other systems reviewed and are negative.   Past Medical History:  Diagnosis Date   Anemia    Arthritis    Knee both knees   Blood transfusion without reported diagnosis 2012   anemia;pt denies transfusion stated was only on iron tablet   Cataract    left   CKD (chronic kidney disease), stage III ([place])    CVA (cerebral infarction)    2006   Diabetes mellitus without complication ([place])    Family history of anesthesia complication    sister very slow to awaken after anesthesia;severe vomiting    Gout    left elbow   Groin pain 06/21/2019   Heart murmur    Herpes infection 08-09-14   Saw doctor Wed. 08-09-14 Right eye   History of gout 01/04/2019   Hyperlipidemia    Hypertension    Nocturia    3-4 times per night   Pseudomonas aeruginosa infection 12/15/2018   Stroke ([place]) 2006   x  1 no deficits noted     Outpatient Medications Prior to Visit  Medication Sig Dispense Refill   acetaminophen (TYLENOL) 500 MG tablet Take 2 tablets (1,000 mg total) by mouth every 6 (six) hours as needed for mild pain. 90 tablet 1   amLODipine (NORVASC) 10 MG tablet TAKE 1 TABLET DAILY IN THE EVENING 90 tablet 1   atorvastatin (LIPITOR) 20 MG tablet Take 1 tablet (20 mg total) by mouth every evening. 90 tablet 1   ciprofloxacin (CIPRO) 500 MG tablet Take 1 tablet (500 mg total) by mouth 2 (two) times daily. 60 tablet 4   diltiazem (CARDIZEM CD) 240 MG 24 hr capsule Take 1  capsule (240 mg total) by mouth daily. For blood pressure 90 capsule 1   hydrALAZINE (APRESOLINE) 100 MG tablet TAKE ONE (1) TABLET THREE (3) TIMES EACH DAY 270 tablet 1   isosorbide dinitrate (ISORDIL) 10 MG tablet TAKE ONE (1) TABLET THREE (3) TIMES EACH DAY 90 tablet 0   metFORMIN (GLUCOPHAGE-XR) 500 MG 24 hr tablet TAKE ONE (1) TABLET EACH DAY 90 tablet 1   Nebivolol HCl (BYSTOLIC) 20 MG TABS TAKE ONE (1) TABLET EACH DAY 90 each 1   olmesartan (BENICAR) 40 MG tablet Take 1 tablet (40 mg total) by mouth daily. 90 tablet 1   ONE TOUCH ULTRA TEST test strip Use to check glucose up to four times daily 400 each 1   ONETOUCH DELICA LANCETS 29V MISC USE TO CHECK GLUCOSE UP TO  4 TIMES DAILY 400 each 0   TRADJENTA 5 MG TABS tablet TAKE ONE (1) TABLET EACH DAY 30 tablet 5   furosemide (LASIX) 20 MG tablet Take 1 tablet (20 mg total) by mouth daily. 30 tablet 5   oxyCODONE (OXY IR/ROXICODONE) 5 MG immediate release tablet Take 1 tablet (5 mg total) by mouth every 6 (six) hours as needed for moderate pain (pain score 4-6). 5 tablet 0   Facility-Administered Medications Prior to Visit  Medication Dose Route Frequency Provider Last Rate Last Dose   tranexamic acid (CYKLOKAPRON) 2,000 mg in sodium chloride 0.9 % 50 mL Topical Application  7,473 mg Topical Once Aluisio, Pilar Plate, MD         Allergies  Allergen Reactions   Aleve [Naproxen Sodium] Other (See Comments)    Heart races   Asa [Aspirin] Other (See Comments)    Nose bleeding   Clonidine Derivatives Other (See Comments)    Dizziness and weakness   Shellfish Allergy Nausea And Vomiting    Social History   Tobacco Use   Smoking status: Never Smoker   Smokeless tobacco: Never Used  Substance Use Topics   Alcohol use: No   Drug use: No    Family History  Problem Relation Age of Onset   Ovarian cancer Mother    Cancer Mother    Diabetes Son    Diabetes Son    Peripheral vascular disease Father        with  amputation of both legs   Hypertension Father    Heart disease Brother    Kidney disease Daughter    Colon cancer Neg Hx    Esophageal cancer Neg Hx    Stomach cancer Neg Hx    Rectal cancer Neg Hx     Objective:   Vitals:   07/26/19 1202  BP: (!) 174/77  Pulse: 77  Temp: 98.3 F (36.8 C)  TempSrc: Oral   There is no height or weight on file to calculate BMI.  Physical Exam Constitutional:      Comments: Seated in wheelchair. Appears mildly uncomfortable with right groin pain. Otherwise well appearing.   Cardiovascular:     Rate and Rhythm: Normal rate and regular rhythm.  Pulmonary:     Effort: Pulmonary effort is normal.  Abdominal:     General: There is no distension.     Palpations: Abdomen is soft.  Musculoskeletal:        General: Tenderness (R groin. No warmth. ) present.     Comments: R knee in immobilizer. No tenderness or fluctuance with palpation.   Skin:    General: Skin is warm and dry.     Capillary Refill: Capillary refill takes less than 2 seconds.  Neurological:     Mental Status: She is alert and oriented to person, place, and time.     Lab Results: Lab Results  Component Value Date   WBC 5.5 06/21/2019   HGB 10.9 (L) 06/21/2019   HCT 35.0 06/21/2019   MCV 79.9 (L) 06/21/2019   PLT 345 06/21/2019    Lab Results  Component Value Date   CREATININE 1.03 (H) 06/21/2019   BUN 31 (H) 06/21/2019   NA 139 06/21/2019   K 4.2 06/21/2019   CL 106 06/21/2019   CO2 25 06/21/2019    Lab Results  Component Value Date   ALT 9 01/04/2019   AST 14 01/04/2019   ALKPHOS 111 01/04/2019   BILITOT 0.5 01/04/2019    Sed Rate (mm/h)  Date Value  06/21/2019 43 (H)  01/04/2019 67 (H)  12/15/2018 70 (H)   CRP (mg/L)  Date Value  06/21/2019 13.1 (H)  01/04/2019 10.4 (H)  12/15/2018 29.2 (H)     Assessment & Plan:   Problem List Items Addressed This Visit      High   Infection of total right knee replacement (HCC)    Significant  history of previous R knee infections now on second attempt for two-staged approach to cure her infection. She desires to have her knee re-implanted. I discussed with her that next steps for this would be to hold antibiotics for a period of time. For her she may require 4 or even 6 weeks of observation off antibiotics prior to implant given her history of now second attempt to cure this infection.  She has had concern for ongoing infection given her now chronically elevated CRP otherwise unexplained. Will repeat these today and discuss with Dr. Tommy Medal plan to stop, especially in light of concern over potential tendinopathy related to chronic use.       Relevant Orders   C-reactive protein   Sedimentation rate   Pseudomonas aeruginosa infection - Primary     Medium   Right groin pain    MRI reviewed with the patient and her son and no signs concerning for infection. 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 I am however concerned with the mention of hamstring tendinosis - ? If related to chronic flouroquinolone use given no other explanation for the pain on scan nor from history; I assume it is also possible she is compensating with gait given her prolonged limitation of right knee spacer.  Will discuss with Dr. Tommy Medal further and I will call Ms. Flaharty this week with decision about her antibiotic.          [name], MSN, NP-C [facility]  [email or web address] Pager: [phone] Office: [phone] [place]: [phone]

## 2019-07-26 NOTE — Patient Instructions (Addendum)
Nice to meet you both today!  Please continue the cipro antibiotic twice a day until I make certain with Dr. Tommy Medal he is OK if I stop your antibiotics.   This would be the next step in preparing to re-implant your knee to prove infection is gone.   I look forward to calling and discussing with you later this week.

## 2019-07-26 NOTE — Assessment & Plan Note (Signed)
Significant history of previous R knee infections now on second attempt for two-staged approach to cure her infection. She desires to have her knee re-implanted. I discussed with her that next steps for this would be to hold antibiotics for a period of time. For her she may require 4 or even 6 weeks of observation off antibiotics prior to implant given her history of now second attempt to cure this infection.  She has had concern for ongoing infection given her now chronically elevated CRP otherwise unexplained. Will repeat these today and discuss with Dr. Tommy Medal plan to stop, especially in light of concern over potential tendinopathy related to chronic use.

## 2019-07-26 NOTE — Assessment & Plan Note (Signed)
MRI reviewed with the patient and her son and no signs concerning for infection. 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 I am however concerned with the mention of hamstring tendinosis - ? If related to chronic flouroquinolone use given no other explanation for the pain on scan nor from history; I assume it is also possible she is compensating with gait given her prolonged limitation of right knee spacer.  Will discuss with Dr. Tommy Medal further and I will call Beth [name] this week with decision about her antibiotic.

## 2019-07-27 LAB — SEDIMENTATION RATE: Sed Rate: 28 mm/h (ref 0–30)

## 2019-07-27 LAB — C-REACTIVE PROTEIN: CRP: 3.4 mg/L (ref <8.0)

## 2019-07-27 NOTE — Progress Notes (Signed)
Normalized markers - I called Beth [name] to inform her that we are going to stop her cipro and observe off antibiotics. D/W Dr. Tommy Medal as well. 4 week follow up arranged with him to discuss next steps with regards to re-implantation of the knee joint.

## 2019-07-29 DIAGNOSIS — Z89512 Acquired absence of left leg below knee: Secondary | ICD-10-CM | POA: Diagnosis not present

## 2019-07-29 DIAGNOSIS — M179 Osteoarthritis of knee, unspecified: Secondary | ICD-10-CM | POA: Diagnosis not present

## 2019-07-29 DIAGNOSIS — M00861 Arthritis due to other bacteria, right knee: Secondary | ICD-10-CM | POA: Diagnosis not present

## 2019-07-29 DIAGNOSIS — M1712 Unilateral primary osteoarthritis, left knee: Secondary | ICD-10-CM | POA: Diagnosis not present

## 2019-08-18 ENCOUNTER — Telehealth: Payer: Self-pay

## 2019-08-18 NOTE — Telephone Encounter (Signed)
COVID-19 Pre-Screening Questions:08/18/19  Do you currently have a fever (>100 F), chills or unexplained body aches? NO  Are you currently experiencing new cough, shortness of breath, sore throat, runny nose?NO .  Marland Kitchen Have you recently travelled outside the state of [place] in the last 14 days? NO .  Have you been in contact with someone that is currently pending confirmation of Covid19 testing or has been confirmed to have the [place] virus?  NO  **If the patient answers NO to ALL questions -  advise the patient to please call the clinic before coming to the office should any symptoms develop.

## 2019-08-22 ENCOUNTER — Other Ambulatory Visit: Payer: Self-pay

## 2019-08-22 ENCOUNTER — Encounter: Payer: Self-pay | Admitting: Infectious Disease

## 2019-08-22 ENCOUNTER — Ambulatory Visit (INDEPENDENT_AMBULATORY_CARE_PROVIDER_SITE_OTHER): Payer: Medicare Other | Admitting: Infectious Disease

## 2019-08-22 VITALS — BP 124/73 | HR 59 | Temp 98.2°F

## 2019-08-22 DIAGNOSIS — A498 Other bacterial infections of unspecified site: Secondary | ICD-10-CM

## 2019-08-22 DIAGNOSIS — M47818 Spondylosis without myelopathy or radiculopathy, sacral and sacrococcygeal region: Secondary | ICD-10-CM

## 2019-08-22 DIAGNOSIS — R1031 Right lower quadrant pain: Secondary | ICD-10-CM | POA: Diagnosis not present

## 2019-08-22 DIAGNOSIS — M461 Sacroiliitis, not elsewhere classified: Secondary | ICD-10-CM

## 2019-08-22 DIAGNOSIS — M76891 Other specified enthesopathies of right lower limb, excluding foot: Secondary | ICD-10-CM | POA: Insufficient documentation

## 2019-08-22 DIAGNOSIS — T8453XD Infection and inflammatory reaction due to internal right knee prosthesis, subsequent encounter: Secondary | ICD-10-CM

## 2019-08-22 DIAGNOSIS — M76899 Other specified enthesopathies of unspecified lower limb, excluding foot: Secondary | ICD-10-CM

## 2019-08-22 HISTORY — DX: Sacroiliitis, not elsewhere classified: M46.1

## 2019-08-22 HISTORY — DX: Other specified enthesopathies of unspecified lower limb, excluding foot: M76.899

## 2019-08-22 NOTE — Progress Notes (Signed)
Subjective:  Chief complaint: Follow-up for right prosthetic knee infection and right groin pain   l Patient ID: Beth [name], female    DOB: 02/03/1946, 73 y.o.   MRN: [id number]  HPI  Beth [name] is a 73  y.o. 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  She apparently originally had right knee total arthroplasty in July 2015.  In the interim she had an I&D in October 2015 with poly-exchange followed by antibiotics.  She then had in 2016 a right knee resection arthroplasty with placement of antibiotic spacers followed by antibiotics and followed by reimplantation of new prosthetic knee.  Unfortunately she is again had failure and of the attempts to eradicate infection in his knee and her prosthetic knee has been removed and antibiotic spacer placed  Is my understanding that no organism had been isolated in the past.  Does not appear that we were ever involved with her care before thing when I can see and less it was in the paper record I cannot see any notes over the last several years in epic.  He subsequently grew a pansensitive pseudomonal species.  She was discharged on cefepime but then admitted with renal failure and changed to oral ciprofloxacin she was having trouble with her PICC line as well.  Renal function had improved during her hospitalization and she just continued on ciprofloxacin in the skilled nursing facility.  Knee pain is better now versus when she was in the hospital.  She was due to complete the ciprofloxacin on 10 January.  Ever in the interim she was seen by me and we rechecked inflammatory markers which were still quite high sed rate still in the 70s and CRP had gone up into the 20s.  Therefore I continued her on oral ciprofloxacin in the nursing facility.  When she was discharged some skilled nurse facility on 20 January they unfortunately did not send her home with oral antibiotics.  Since then she continue oral  ciprofloxacin.  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  Beth [name] saw the patient in clinic reviewed films and had some concern whether or not the tendinosis seen on imaging could be related to chronic fluoroquinolone usage.  The patient's pain in her knee had improved as mentioned inflammatory markers had trended down.  Patient was taken off ciprofloxacin to see how she would do both in terms of her groin and hip pain and also her knee pain.  Since then her hip pain is improved dramatically suggesting the possibility that fluoroquinolones could have been in play in terms of causing a tendinopathy.  She also has stable pain in her right knee which is not bothering her as much as in the past.  We will plan to check her inflammatory markers today and if they are normal will encourage her to make follow-up appointment Dr. Maureen [name].    Past Medical History:  Diagnosis Date  . Anemia   . Arthritis    Knee both knees  . Blood transfusion without reported diagnosis 2012   anemia;pt denies transfusion stated was only on iron tablet  . Cataract    left  . CKD (chronic kidney disease), stage III ([place])   . CVA (cerebral  infarction)    2006  . Diabetes mellitus without complication ([place])   . Family history of anesthesia complication    sister very slow to awaken after anesthesia;severe vomiting   . Gout    left elbow  . Groin pain 06/21/2019  . Heart murmur   . 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 08-09-14 Right eye  . History of gout 01/04/2019  . Hyperlipidemia   . Hypertension   . Nocturia    3-4 times per night  . 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 Stroke [facility]) 2006   x 1 no deficits noted     Past Surgical History:   Procedure Laterality Date  . ABDOMINAL HYSTERECTOMY  1983  . CARPAL TUNNEL RELEASE Right 1983  . colonscopy  June 21, 2014  . EXCISIONAL TOTAL KNEE ARTHROPLASTY WITH ANTIBIOTIC SPACERS Right 02/16/2015   Procedure: RIGHT KNEE RESECTION ARTHROPLASTY WITH ANTIBIOTIC SPACERS;  Surgeon: [name], MD;  Location: [facility];  Service: Orthopedics;  Laterality: Right;  . EXCISIONAL TOTAL KNEE ARTHROPLASTY WITH ANTIBIOTIC SPACERS Right 11/03/2018   Procedure: Right knee resection arthroplasty; antibiotic spacer;  Surgeon: [name], MD;  Location: [facility];  Service: Orthopedics;  Laterality: Right;  Adductor Block  . I&D KNEE WITH POLY EXCHANGE Right 10/02/2014   Procedure: IRRIGATION AND DEBRIDEMENT RIGHT KNEE WITH POLY EXCHANGE;  Surgeon: Gearlean Alf, MD;  Location: [facility];  Service: Orthopedics;  Laterality: Right;  . INCISION AND DRAINAGE OF WOUND Right 01/14/2017   Procedure: IRRIGATION AND DEBRIDEMENT WOUND;  Surgeon: [name], MD;  Location: [facility];  Service: Orthopedics;  Laterality: Right;  requests 12mins  . JOINT REPLACEMENT  06/2014   right knee  . nasal cauterization  2012  . PATELLAR TENDON REPAIR Right 08/11/2014   Procedure: RIGHT PATELLA TENDON REPAIR;  Surgeon: Gearlean Alf, MD;  Location: [facility];  Service: Orthopedics;  Laterality: Right;  . REIMPLANTATION OF TOTAL KNEE Right 05/23/2015   Procedure: RIGHT KNEE ARTHROPLASTY REIMPLANTATION;  Surgeon: [name], MD;  Location: [facility];  Service: Orthopedics;  Laterality: Right;  . TOTAL KNEE ARTHROPLASTY Right 07/03/2014   Procedure: RIGHT TOTAL KNEE ARTHROPLASTY;  Surgeon: Gearlean Alf, MD;  Location: [facility];  Service: Orthopedics;  Laterality: Right;  . TUBAL LIGATION      Family History  Problem Relation Age of Onset  . Ovarian cancer Mother   . Cancer Mother   . Diabetes Son   . Diabetes Son   . Peripheral vascular disease Father        with amputation of both legs  . Hypertension Father   . Heart disease  Brother   . Kidney disease Daughter   . Colon cancer Neg Hx   . Esophageal cancer Neg Hx   . Stomach cancer Neg Hx   . Rectal cancer Neg Hx       Social History   Socioeconomic History  . Marital status: Single    Spouse name: Not on file  . Number of children: 7  . Years of education: 8th  . Highest education level: 8th grade  Occupational History    Employer: RETIRED  Social Needs  . Financial resource strain: Not hard at all  . Food insecurity    Worry: Never true    Inability: Never true  . Transportation needs    Medical: No    Non-medical: No  Tobacco Use  . Smoking status: Never Smoker  . Smokeless tobacco: Never Used  Substance and  Sexual Activity  . Alcohol use: No  . Drug use: No  . Sexual activity: Never  Lifestyle  . Physical activity    Days per week: 4 days    Minutes per session: 20 min  . Stress: Only a little  Relationships  . Social connections    Talks on phone: More than three times a week    Gets together: More than three times a week    Attends religious service: More than 4 times per year    Active member of club or organization: No    Attends meetings of clubs or organizations: Never    Relationship status: Divorced  Other Topics Concern  . Not on file  Social History Narrative   Patient is widowed and lives with her granddaughter. She has 7 adult children and many grandchildren and great grandchildren. Patient is retired. 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     Allergies  Allergen Reactions  . Aleve [Naproxen Sodium] Other (See Comments)    Heart races  . Asa [Aspirin] Other (See Comments)    Nose bleeding  . Clonidine Derivatives Other (See Comments)    Dizziness and weakness  . Shellfish Allergy Nausea And Vomiting     Current Outpatient Medications:  .  acetaminophen (TYLENOL) 500 MG tablet, Take 2 tablets (1,000 mg total) by mouth every 6 (six) hours as needed for mild pain., Disp: 90 tablet, Rfl: 1  .  amLODipine (NORVASC) 10 MG tablet, TAKE 1 TABLET DAILY IN THE EVENING, Disp: 90 tablet, Rfl: 1 .  atorvastatin (LIPITOR) 20 MG tablet, Take 1 tablet (20 mg total) by mouth every evening., Disp: 90 tablet, Rfl: 1 .  ciprofloxacin (CIPRO) 500 MG tablet, Take 1 tablet (500 mg total) by mouth 2 (two) times daily., Disp: 60 tablet, Rfl: 4 .  diltiazem (CARDIZEM CD) 240 MG 24 hr capsule, Take 1 capsule (240 mg total) by mouth daily. For blood pressure, Disp: 90 capsule, Rfl: 1 .  hydrALAZINE (APRESOLINE) 100 MG tablet, TAKE ONE (1) TABLET THREE (3) TIMES EACH DAY, Disp: 270 tablet, Rfl: 1 .  isosorbide dinitrate (ISORDIL) 10 MG tablet, TAKE ONE (1) TABLET THREE (3) TIMES EACH DAY, Disp: 90 tablet, Rfl: 0 .  metFORMIN (GLUCOPHAGE-XR) 500 MG 24 hr tablet, TAKE ONE (1) TABLET EACH DAY, Disp: 90 tablet, Rfl: 1 .  Nebivolol HCl (BYSTOLIC) 20 MG TABS, TAKE ONE (1) TABLET EACH DAY, Disp: 90 each, Rfl: 1 .  olmesartan (BENICAR) 40 MG tablet, Take 1 tablet (40 mg total) by mouth daily., Disp: 90 tablet, Rfl: 1 .  ONE TOUCH ULTRA TEST test strip, Use to check glucose up to four times daily, Disp: 400 each, Rfl: 1 .  ONETOUCH DELICA LANCETS 99991111 MISC, USE TO CHECK GLUCOSE UP TO  4 TIMES DAILY, Disp: 400 each, Rfl: 0 .  TRADJENTA 5 MG TABS tablet, TAKE ONE (1) TABLET EACH DAY, Disp: 30 tablet, Rfl: 5 .  furosemide (LASIX) 20 MG tablet, Take 1 tablet (20 mg total) by mouth daily., Disp: 30 tablet, Rfl: 5 .  oxyCODONE (OXY IR/ROXICODONE) 5 MG immediate release tablet, Take 1 tablet (5 mg total) by mouth every 6 (six) hours as needed for moderate pain (pain score 4-6)., Disp: 5 tablet, Rfl: 0 No current facility-administered medications for this visit.   Facility-Administered Medications Ordered in Other Visits:  .  tranexamic acid (CYKLOKAPRON) 2,000 mg in sodium chloride 0.9 % 50 mL Topical Application, 123XX123 mg, Topical, Once, Aluisio,  Pilar Plate, MD  Review of Systems  Constitutional: Negative for activity change,  appetite change, chills, diaphoresis, fatigue, fever and unexpected weight change.  HENT: Negative for congestion, rhinorrhea, sinus pressure, sneezing, sore throat and trouble swallowing.   Eyes: Negative for photophobia and visual disturbance.  Respiratory: Negative for cough, chest tightness, shortness of breath, wheezing and stridor.   Cardiovascular: Negative for chest pain, palpitations and leg swelling.  Gastrointestinal: Negative for abdominal distention, abdominal pain, anal bleeding, blood in stool, constipation, diarrhea, nausea and vomiting.  Genitourinary: Negative for difficulty urinating, dysuria, flank pain and hematuria.  Musculoskeletal: Positive for arthralgias. Negative for back pain, joint swelling and myalgias.  Skin: Negative for color change, pallor and rash.  Neurological: Negative for dizziness, tremors, weakness and light-headedness.  Hematological: Negative for adenopathy. Does not bruise/bleed easily.  Psychiatric/Behavioral: Negative for agitation, behavioral problems, confusion, decreased concentration, dysphoric mood and sleep disturbance. The patient is not nervous/anxious.        Objective:   Physical Exam Constitutional:      General: She is not in acute distress.    Appearance: She is not diaphoretic.  HENT:     Head: Normocephalic and atraumatic.     Right Ear: External ear normal.     Left Ear: External ear normal.     Nose: Nose normal.  Eyes:     General: No scleral icterus.    Conjunctiva/sclera: Conjunctivae normal.  Neck:     Musculoskeletal: Normal range of motion and neck supple.  Cardiovascular:     Rate and Rhythm: Normal rate and regular rhythm.  Pulmonary:     Effort: Pulmonary effort is normal. No respiratory distress.  Abdominal:     General: There is no distension.  Musculoskeletal:        General: No tenderness.       Legs:  Lymphadenopathy:     Cervical: No cervical adenopathy.  Skin:    General: Skin is warm and dry.      Coloration: Skin is not pale.     Findings: No erythema or rash.  Neurological:     General: No focal deficit present.     Mental Status: She is alert and oriented to person, place, and time.     Coordination: Coordination normal.  Psychiatric:        Attention and Perception: Attention normal.        Mood and Affect: Mood normal. Mood is not anxious.        Behavior: Behavior normal.        Thought Content: Thought content normal.        Judgment: Judgment normal.    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 Maureen [name]  I would recommend joint aspiration with fluid sent for cell count differential and culture off antibiotics prior to considering reimplantation of prosthetic knee.  Right groin pain: Has improved off of fluoroquinolone raising question of tendinopathy could be also related to sacroiliac disease.  Somatic be mindful about this potential adverse effect from ciprofloxacin   .

## 2019-08-23 ENCOUNTER — Telehealth: Payer: Self-pay

## 2019-08-23 LAB — CBC WITH DIFFERENTIAL/PLATELET
Absolute Monocytes: 771 cells/uL (ref 200–950)
Basophils Absolute: 47 cells/uL (ref 0–200)
Basophils Relative: 0.7 %
Eosinophils Absolute: 831 cells/uL — ABNORMAL HIGH (ref 15–500)
Eosinophils Relative: 12.4 %
HCT: 37.7 % (ref 35.0–45.0)
Hemoglobin: 12 g/dL (ref 11.7–15.5)
Lymphs Abs: 1528 cells/uL (ref 850–3900)
MCH: 25.2 pg — ABNORMAL LOW (ref 27.0–33.0)
MCHC: 31.8 g/dL — ABNORMAL LOW (ref 32.0–36.0)
MCV: 79 fL — ABNORMAL LOW (ref 80.0–100.0)
MPV: 10.2 fL (ref 7.5–12.5)
Monocytes Relative: 11.5 %
Neutro Abs: 3524 cells/uL (ref 1500–7800)
Neutrophils Relative %: 52.6 %
Platelets: 337 10*3/uL (ref 140–400)
RBC: 4.77 10*6/uL (ref 3.80–5.10)
RDW: 14.7 % (ref 11.0–15.0)
Total Lymphocyte: 22.8 %
WBC: 6.7 10*3/uL (ref 3.8–10.8)

## 2019-08-23 LAB — BASIC METABOLIC PANEL WITH GFR
BUN/Creatinine Ratio: 23 (calc) — ABNORMAL HIGH (ref 6–22)
BUN: 28 mg/dL — ABNORMAL HIGH (ref 7–25)
CO2: 24 mmol/L (ref 20–32)
Calcium: 9.8 mg/dL (ref 8.6–10.4)
Chloride: 105 mmol/L (ref 98–110)
Creat: 1.23 mg/dL — ABNORMAL HIGH (ref 0.60–0.93)
GFR, Est African American: 50 mL/min/{1.73_m2} — ABNORMAL LOW (ref >=60)
GFR, Est Non African American: 43 mL/min/{1.73_m2} — ABNORMAL LOW (ref >=60)
Glucose, Bld: 71 mg/dL (ref 65–99)
Potassium: 4.2 mmol/L (ref 3.5–5.3)
Sodium: 140 mmol/L (ref 135–146)

## 2019-08-23 LAB — SEDIMENTATION RATE: Sed Rate: 38 mm/h — ABNORMAL HIGH (ref 0–30)

## 2019-08-23 LAB — C-REACTIVE PROTEIN: CRP: 5.9 mg/L (ref <8.0)

## 2019-08-23 NOTE — Telephone Encounter (Signed)
-----  Message from Truman Hayward, MD sent at 08/23/2019  8:45 AM EDT ----- ESR is up slightly to 38. CRP is reassuring  I dont think Dr. Maureen Ralphs will rush to put in prosthetic joint with one inflammatory marker going up  It would still bee good idea for her to make appt with him and keep one w me

## 2019-08-23 NOTE — Telephone Encounter (Signed)
Patient made aware of MD advise and lab results. Also made aware to made an appointment with Dr. Maureen Ralphs.  Patient verbalized understanding.  Eugenia Mcalpine

## 2019-08-29 ENCOUNTER — Other Ambulatory Visit: Payer: Self-pay | Admitting: Family Medicine

## 2019-08-29 ENCOUNTER — Other Ambulatory Visit: Payer: Self-pay

## 2019-08-29 ENCOUNTER — Ambulatory Visit (INDEPENDENT_AMBULATORY_CARE_PROVIDER_SITE_OTHER): Payer: Medicare Other | Admitting: Family Medicine

## 2019-08-29 ENCOUNTER — Encounter: Payer: Self-pay | Admitting: Family Medicine

## 2019-08-29 DIAGNOSIS — M179 Osteoarthritis of knee, unspecified: Secondary | ICD-10-CM | POA: Diagnosis not present

## 2019-08-29 DIAGNOSIS — M00861 Arthritis due to other bacteria, right knee: Secondary | ICD-10-CM | POA: Diagnosis not present

## 2019-08-29 DIAGNOSIS — J0100 Acute maxillary sinusitis, unspecified: Secondary | ICD-10-CM

## 2019-08-29 DIAGNOSIS — M1712 Unilateral primary osteoarthritis, left knee: Secondary | ICD-10-CM | POA: Diagnosis not present

## 2019-08-29 DIAGNOSIS — Z89512 Acquired absence of left leg below knee: Secondary | ICD-10-CM | POA: Diagnosis not present

## 2019-08-29 MED ORDER — CEFUROXIME AXETIL 250 MG PO TABS: 250.0000 mg | ORAL_TABLET | Freq: Two times a day (BID) | ORAL | 0 refills | Status: AC

## 2019-08-29 NOTE — Progress Notes (Signed)
Subjective:    Patient ID: Beth [name], female    DOB: 1946/11/05, 73 y.o.   MRN: [id number]   HPI: Beth [name] is a 73 y.o. female presenting for Patient presents with upper respiratory congestion. Rhinorrhea that is frequently purulent. There is moderate sore throat. Patient denies ough There is no fever, chills or sweats. The patient denies being short of breath. Onset was 3-5 days ago. Gradually worsening. Tried OTCs without improvement. HA in frontal area, centrally.  Checking temp TID - no fever. Bp 148/88   Depression screen [facility] 2/9 08/22/2019 01/04/2019 06/07/2018 12/30/2017 12/18/2017  Decreased Interest 0 0 0 0 0  Down, Depressed, Hopeless 0 0 0 0 0  PHQ - 2 Score 0 0 0 0 0  Some recent data might be hidden     Relevant past medical, surgical, family and social history reviewed and updated as indicated.  Interim medical history since our last visit reviewed. Allergies and medications reviewed and updated.  ROS:  Review of Systems  Constitutional: Negative for appetite change, chills, diaphoresis, fatigue and fever.  HENT: Positive for congestion and sinus pressure. Negative for ear pain, hearing loss, postnasal drip, rhinorrhea, sore throat and trouble swallowing.   Respiratory: Negative for cough, chest tightness and shortness of breath.   Cardiovascular: Negative for chest pain and palpitations.  Gastrointestinal: Negative for abdominal pain.  Musculoskeletal: Negative for arthralgias.  Skin: Negative for rash.  Neurological: Positive for headaches.     Social History   Tobacco Use  Smoking Status Never Smoker  Smokeless Tobacco Never Used       Objective:     Wt Readings from Last 3 Encounters:  01/04/19 201 lb 12.8 oz (91.5 kg)  01/04/19 201 lb 8 oz (91.4 kg)  12/15/18 202 lb (91.6 kg)     Exam deferred. Pt. [place] due to COVID 19. Phone visit performed.   Assessment & Plan:   1. Acute maxillary sinusitis, recurrence not specified     Meds  ordered this encounter  Medications  . cefUROXime (CEFTIN) 250 MG tablet    Sig: Take 1 tablet (250 mg total) by mouth 2 (two) times daily with a meal for 10 days.    Dispense:  20 tablet    Refill:  0    No orders of the defined types were placed in this encounter.     Diagnoses and all orders for this visit:  Acute maxillary sinusitis, recurrence not specified  Other orders -     cefUROXime (CEFTIN) 250 MG tablet; Take 1 tablet (250 mg total) by mouth 2 (two) times daily with a meal for 10 days.    Virtual Visit via telephone Note  I discussed the limitations, risks, security and privacy concerns of performing an evaluation and management service by telephone and the availability of in person appointments. The patient was identified with two identifiers. Pt.expressed understanding and agreed to proceed. Pt. Is at home. Dr. Livia Snellen is in his office.  Follow Up Instructions:   I discussed the assessment and treatment plan with the patient. The patient was provided an opportunity to ask questions and all were answered. The patient agreed with the plan and demonstrated an understanding of the instructions.   The patient was advised to call back or seek an in-person evaluation if the symptoms worsen or if the condition fails to improve as anticipated.   Total minutes including chart review and phone contact time: 12   Follow up plan: No  follow-ups on file.  Claretta Fraise, MD [place]

## 2019-09-01 DIAGNOSIS — M79676 Pain in unspecified toe(s): Secondary | ICD-10-CM | POA: Diagnosis not present

## 2019-09-01 DIAGNOSIS — B351 Tinea unguium: Secondary | ICD-10-CM | POA: Diagnosis not present

## 2019-09-01 DIAGNOSIS — L84 Corns and callosities: Secondary | ICD-10-CM | POA: Diagnosis not present

## 2019-09-01 DIAGNOSIS — E1142 Type 2 diabetes mellitus with diabetic polyneuropathy: Secondary | ICD-10-CM | POA: Diagnosis not present

## 2019-09-19 ENCOUNTER — Other Ambulatory Visit: Payer: Self-pay | Admitting: Family Medicine

## 2019-09-28 DIAGNOSIS — Z89512 Acquired absence of left leg below knee: Secondary | ICD-10-CM | POA: Diagnosis not present

## 2019-09-28 DIAGNOSIS — M179 Osteoarthritis of knee, unspecified: Secondary | ICD-10-CM | POA: Diagnosis not present

## 2019-09-28 DIAGNOSIS — M00861 Arthritis due to other bacteria, right knee: Secondary | ICD-10-CM | POA: Diagnosis not present

## 2019-09-28 DIAGNOSIS — M1712 Unilateral primary osteoarthritis, left knee: Secondary | ICD-10-CM | POA: Diagnosis not present

## 2019-09-29 ENCOUNTER — Other Ambulatory Visit: Payer: Self-pay

## 2019-09-29 ENCOUNTER — Other Ambulatory Visit: Payer: Self-pay | Admitting: Family Medicine

## 2019-09-29 DIAGNOSIS — M25561 Pain in right knee: Secondary | ICD-10-CM | POA: Diagnosis not present

## 2019-09-29 NOTE — Patient Outreach (Signed)
[facility]) Care Management  09/29/2019  ROSIA RHOME [birth year] [id number]   Medication Adherence call to Mrs. Chales Abrahams [place] Identifiers Verify spoke with patient she is past due on Atorvastatin 20 mg she explain she takes 1 tablet daily and has place an order thru the pharmacy and will pick up in the next couple of days.Mrs. Tabata is showing past due under [place].   [facility] Management Direct Dial [phone]  Fax [phone] [email or web address]

## 2019-10-10 ENCOUNTER — Ambulatory Visit: Payer: Medicare Other

## 2019-10-24 ENCOUNTER — Other Ambulatory Visit: Payer: Self-pay | Admitting: Family Medicine

## 2019-10-31 ENCOUNTER — Other Ambulatory Visit: Payer: Self-pay

## 2019-10-31 ENCOUNTER — Encounter: Payer: Self-pay | Admitting: Infectious Disease

## 2019-10-31 ENCOUNTER — Ambulatory Visit (INDEPENDENT_AMBULATORY_CARE_PROVIDER_SITE_OTHER): Payer: Medicare Other | Admitting: Infectious Disease

## 2019-10-31 ENCOUNTER — Telehealth: Payer: Self-pay

## 2019-10-31 VITALS — Temp 97.9°F | Wt 210.0 lb

## 2019-10-31 DIAGNOSIS — A498 Other bacterial infections of unspecified site: Secondary | ICD-10-CM

## 2019-10-31 DIAGNOSIS — T8453XD Infection and inflammatory reaction due to internal right knee prosthesis, subsequent encounter: Secondary | ICD-10-CM

## 2019-10-31 NOTE — Progress Notes (Signed)
Subjective:  Chief complaint: Follow-up for right prosthetic knee infection   l Patient ID: Beth [name], female    DOB: 03/23/1946, 73 y.o.   MRN: [id number]  HPI  Beth [name] is a 73  y.o. 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  She apparently originally had right knee total arthroplasty in July 2015.  In the interim she had an I&D in October 2015 with poly-exchange followed by antibiotics.  She then had in 2016 a right knee resection arthroplasty with placement of antibiotic spacers followed by antibiotics and followed by reimplantation of new prosthetic knee.  Unfortunately she is again had failure and of the attempts to eradicate infection in his knee and her prosthetic knee has been removed and antibiotic spacer placed  Is my understanding that no organism had been isolated in the past.  Does not appear that we were ever involved with her care before thing when I can see and less it was in the paper record I cannot see any notes over the last several years in epic.  He subsequently grew a pansensitive pseudomonal species.  She was discharged on cefepime but then admitted with renal failure and changed to oral ciprofloxacin she was having trouble with her PICC line as well.  Renal function had improved during her hospitalization and she just continued on ciprofloxacin in the skilled nursing facility.  Knee pain is better now versus when she was in the hospital.  She was due to complete the ciprofloxacin on 10 January.  Ever in the interim she was seen by me and we rechecked inflammatory markers which were still quite high sed rate still in the 70s and CRP had gone up into the 20s.  Therefore I had continued her on oral ciprofloxacin in the nursing facility.  When she was discharged some skilled nurse facility on 20 January they unfortunately did not send her home with oral antibiotics.  Since then she continue oral ciprofloxacin.   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  Beth [name] saw the patient in clinic reviewed films and had some concern whether or not the tendinosis seen on imaging could be related to chronic fluoroquinolone usage.  The patient's pain in her knee had improved as mentioned inflammatory markers had trended down.  Patient was taken off ciprofloxacin to see how she would do both in terms of her groin and hip pain and also her knee pain.  Since then her hip pain is improved dramatically suggesting the possibility that fluoroquinolones could have been in play in terms of causing a tendinopathy.  Beth [name] DC the FQ at that time.  Her knee pain has remained stable off of antibiotics  Since August of 2020.   Past Medical History:  Diagnosis Date  . Anemia   . Arthritis    Knee both knees  . Blood transfusion without reported diagnosis 2012   anemia;pt denies transfusion stated was only on iron tablet  . Cataract    left  . CKD (chronic kidney disease), stage III   . CVA (cerebral infarction)    2006  . Diabetes mellitus without complication ([place])   . Family history of anesthesia complication    sister very slow  to awaken after anesthesia;severe vomiting   . Gout    left elbow  . Groin pain 06/21/2019  . Hamstring tendinitis 08/22/2019  . Heart murmur   . 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 08-09-14 Right eye  . History of gout 01/04/2019  . Hyperlipidemia   . Hypertension   . Nocturia    3-4 times per night  . 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 Stroke [facility]) 2006   x 1 no deficits noted     Past Surgical History:  Procedure Laterality Date  . ABDOMINAL  HYSTERECTOMY  1983  . CARPAL TUNNEL RELEASE Right 1983  . colonscopy  June 21, 2014  . EXCISIONAL TOTAL KNEE ARTHROPLASTY WITH ANTIBIOTIC SPACERS Right 02/16/2015   Procedure: RIGHT KNEE RESECTION ARTHROPLASTY WITH ANTIBIOTIC SPACERS;  Surgeon: [name], MD;  Location: [facility];  Service: Orthopedics;  Laterality: Right;  . EXCISIONAL TOTAL KNEE ARTHROPLASTY WITH ANTIBIOTIC SPACERS Right 11/03/2018   Procedure: Right knee resection arthroplasty; antibiotic spacer;  Surgeon: [name], MD;  Location: [facility];  Service: Orthopedics;  Laterality: Right;  Adductor Block  . I&D KNEE WITH POLY EXCHANGE Right 10/02/2014   Procedure: IRRIGATION AND DEBRIDEMENT RIGHT KNEE WITH POLY EXCHANGE;  Surgeon: Gearlean Alf, MD;  Location: [facility];  Service: Orthopedics;  Laterality: Right;  . INCISION AND DRAINAGE OF WOUND Right 01/14/2017   Procedure: IRRIGATION AND DEBRIDEMENT WOUND;  Surgeon: [name], MD;  Location: [facility];  Service: Orthopedics;  Laterality: Right;  requests 58mins  . JOINT REPLACEMENT  06/2014   right knee  . nasal cauterization  2012  . PATELLAR TENDON REPAIR Right 08/11/2014   Procedure: RIGHT PATELLA TENDON REPAIR;  Surgeon: Gearlean Alf, MD;  Location: [facility];  Service: Orthopedics;  Laterality: Right;  . REIMPLANTATION OF TOTAL KNEE Right 05/23/2015   Procedure: RIGHT KNEE ARTHROPLASTY REIMPLANTATION;  Surgeon: [name], MD;  Location: [facility];  Service: Orthopedics;  Laterality: Right;  . TOTAL KNEE ARTHROPLASTY Right 07/03/2014   Procedure: RIGHT TOTAL KNEE ARTHROPLASTY;  Surgeon: Gearlean Alf, MD;  Location: [facility];  Service: Orthopedics;  Laterality: Right;  . TUBAL LIGATION      Family History  Problem Relation Age of Onset  . Ovarian cancer Mother   . Cancer Mother   . Diabetes Son   . Diabetes Son   . Peripheral vascular disease Father        with amputation of both legs  . Hypertension Father   . Heart disease Brother   . Kidney disease Daughter    . Colon cancer Neg Hx   . Esophageal cancer Neg Hx   . Stomach cancer Neg Hx   . Rectal cancer Neg Hx       Social History   Socioeconomic History  . Marital status: Single    Spouse name: Not on file  . Number of children: 7  . Years of education: 8th  . Highest education level: 8th grade  Occupational History    Employer: RETIRED  Social Needs  . Financial resource strain: Not hard at all  . Food insecurity    Worry: Never true    Inability: Never true  . Transportation needs    Medical: No    Non-medical: No  Tobacco Use  . Smoking status: Never Smoker  . Smokeless tobacco: Never Used  Substance and Sexual Activity  . Alcohol use: No  . Drug use: No  .  Sexual activity: Never  Lifestyle  . Physical activity    Days per week: 4 days    Minutes per session: 20 min  . Stress: Only a little  Relationships  . Social connections    Talks on phone: More than three times a week    Gets together: More than three times a week    Attends religious service: More than 4 times per year    Active member of club or organization: No    Attends meetings of clubs or organizations: Never    Relationship status: Divorced  Other Topics Concern  . Not on file  Social History Narrative   Patient is widowed and lives with her granddaughter. She has 7 adult children and many grandchildren and great grandchildren. Patient is retired. 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     Allergies  Allergen Reactions  . Aleve [Naproxen Sodium] Other (See Comments)    Heart races  . Asa [Aspirin] Other (See Comments)    Nose bleeding  . Ciprofloxacin Other (See Comments)    Possible hamstring tendinopathy  . Clonidine Derivatives Other (See Comments)    Dizziness and weakness  . Shellfish Allergy Nausea And Vomiting     Current Outpatient Medications:  .  acetaminophen (TYLENOL) 500 MG tablet, Take 2 tablets (1,000 mg total) by mouth every 6 (six) hours as needed for  mild pain., Disp: 90 tablet, Rfl: 1 .  amLODipine (NORVASC) 10 MG tablet, TAKE 1 TABLET DAILY IN THE EVENING, Disp: 90 tablet, Rfl: 1 .  atorvastatin (LIPITOR) 20 MG tablet, Take 1 tablet (20 mg total) by mouth every evening., Disp: 90 tablet, Rfl: 1 .  diltiazem (CARDIZEM CD) 240 MG 24 hr capsule, TAKE ONE (1) CAPSULE EACH DAY, Disp: 90 capsule, Rfl: 1 .  hydrALAZINE (APRESOLINE) 100 MG tablet, TAKE ONE (1) TABLET THREE (3) TIMES EACH DAY, Disp: 270 tablet, Rfl: 1 .  isosorbide dinitrate (ISORDIL) 10 MG tablet, TAKE ONE (1) TABLET THREE (3) TIMES EACH DAY, Disp: 90 tablet, Rfl: 0 .  metFORMIN (GLUCOPHAGE-XR) 500 MG 24 hr tablet, TAKE ONE (1) TABLET EACH DAY, Disp: 90 tablet, Rfl: 0 .  Nebivolol HCl (BYSTOLIC) 20 MG TABS, TAKE ONE (1) TABLET EACH DAY, Disp: 90 each, Rfl: 1 .  olmesartan (BENICAR) 40 MG tablet, Take 1 tablet (40 mg total) by mouth daily. (Needs to be seen before next refill), Disp: 30 tablet, Rfl: 0 .  ONE TOUCH ULTRA TEST test strip, Use to check glucose up to four times daily, Disp: 400 each, Rfl: 1 .  ONETOUCH DELICA LANCETS 99991111 MISC, USE TO CHECK GLUCOSE UP TO  4 TIMES DAILY, Disp: 400 each, Rfl: 0 .  TRADJENTA 5 MG TABS tablet, TAKE ONE (1) TABLET EACH DAY, Disp: 30 tablet, Rfl: 5 .  furosemide (LASIX) 20 MG tablet, Take 1 tablet (20 mg total) by mouth daily., Disp: 30 tablet, Rfl: 5 .  oxyCODONE (OXY IR/ROXICODONE) 5 MG immediate release tablet, Take 1 tablet (5 mg total) by mouth every 6 (six) hours as needed for moderate pain (pain score 4-6)., Disp: 5 tablet, Rfl: 0 No current facility-administered medications for this visit.   Facility-Administered Medications Ordered in Other Visits:  .  tranexamic acid (CYKLOKAPRON) 2,000 mg in sodium chloride 0.9 % 50 mL Topical Application, 123XX123 mg, Topical, Once, Aluisio, Frank, MD  Review of Systems  Constitutional: Negative for activity change, appetite change, chills, diaphoresis, fatigue, fever and unexpected weight change.   HENT:  Negative for congestion, rhinorrhea, sinus pressure, sneezing, sore throat and trouble swallowing.   Eyes: Negative for photophobia and visual disturbance.  Respiratory: Negative for cough, chest tightness, shortness of breath, wheezing and stridor.   Cardiovascular: Negative for chest pain, palpitations and leg swelling.  Gastrointestinal: Negative for abdominal distention, abdominal pain, anal bleeding, blood in stool, constipation, diarrhea, nausea and vomiting.  Genitourinary: Negative for difficulty urinating, dysuria, flank pain and hematuria.  Musculoskeletal: Positive for arthralgias. Negative for back pain, joint swelling and myalgias.  Skin: Negative for color change, pallor and rash.  Neurological: Negative for dizziness, tremors, weakness and light-headedness.  Hematological: Negative for adenopathy. Does not bruise/bleed easily.  Psychiatric/Behavioral: Negative for agitation, behavioral problems, confusion, decreased concentration, dysphoric mood and sleep disturbance. The patient is not nervous/anxious.        Objective:   Physical Exam Constitutional:      General: She is not in acute distress.    Appearance: She is not diaphoretic.  HENT:     Head: Normocephalic and atraumatic.     Right Ear: External ear normal.     Left Ear: External ear normal.     Nose: Nose normal.  Eyes:     General: No scleral icterus.    Conjunctiva/sclera: Conjunctivae normal.  Neck:     Musculoskeletal: Normal range of motion and neck supple.  Cardiovascular:     Rate and Rhythm: Normal rate and regular rhythm.  Pulmonary:     Effort: Pulmonary effort is normal. No respiratory distress.  Abdominal:     General: There is no distension.  Musculoskeletal:        General: No tenderness.       Legs:  Lymphadenopathy:     Cervical: No cervical adenopathy.  Skin:    General: Skin is warm and dry.     Coloration: Skin is not pale.     Findings: No erythema or rash.  Neurological:      General: No focal deficit present.     Mental Status: She is alert and oriented to person, place, and time.     Coordination: Coordination normal.  Psychiatric:        Attention and Perception: Attention normal.        Mood and Affect: Mood normal. Mood is not anxious.        Behavior: Behavior normal.        Thought Content: Thought content normal.        Judgment: Judgment normal.      Assessment & Plan:    Right prosthetic joint infection status post multiple surgeries now status post explantation of prosthesis implantation of antibiotics beads and spacer along with 6 weeks of systemic antibiotics followed by months of ciprofloxacin  We will recheck her sed rate CRP metabolic panel and CBC  If her inflammatory markers are normal I would  Again encourage her to make an appointment with Dr. Maureen Ralphs   I would recommend joint aspiration with fluid sent for cell count differential and culture off antibiotics prior to considering reimplantation of prosthetic knee.  I am skeptical though that an elective surgery is likely to happen anytime soon with spiking COVID numbers.  Right groin pain: Has resolved off of fluoroquinolone raising question of tendinopathy could be also related to sacroiliac disease.  Somatic be mindful about this potential adverse effect from ciprofloxacin   .

## 2019-10-31 NOTE — Telephone Encounter (Signed)
Tommy Medal, Lavell Islam, MD  P [facility] Triage Nurse Pool    Inflammatory markers are up. She needs to see Alusio not for joint replacment but to aspirate joint to look for infection with cell count and differential and culture. Can we also have her see me in next 2-3 weeks not 2 months   Attempted to call patient. Voicemail Box has not been set up at this time. Will attempt to call again tomorrow.  Eugenia Mcalpine

## 2019-11-01 LAB — BASIC METABOLIC PANEL WITH GFR
BUN/Creatinine Ratio: 24 (calc) — ABNORMAL HIGH (ref 6–22)
BUN: 23 mg/dL (ref 7–25)
CO2: 25 mmol/L (ref 20–32)
Calcium: 9.6 mg/dL (ref 8.6–10.4)
Chloride: 103 mmol/L (ref 98–110)
Creat: 0.97 mg/dL — ABNORMAL HIGH (ref 0.60–0.93)
GFR, Est African American: 66 mL/min/{1.73_m2} (ref >=60)
GFR, Est Non African American: 57 mL/min/{1.73_m2} — ABNORMAL LOW (ref >=60)
Glucose, Bld: 189 mg/dL — ABNORMAL HIGH (ref 65–99)
Potassium: 3.8 mmol/L (ref 3.5–5.3)
Sodium: 139 mmol/L (ref 135–146)

## 2019-11-01 LAB — CBC WITH DIFFERENTIAL/PLATELET
Absolute Monocytes: 431 cells/uL (ref 200–950)
Basophils Absolute: 41 cells/uL (ref 0–200)
Basophils Relative: 0.7 %
Eosinophils Absolute: 643 cells/uL — ABNORMAL HIGH (ref 15–500)
Eosinophils Relative: 10.9 %
HCT: 38 % (ref 35.0–45.0)
Hemoglobin: 12 g/dL (ref 11.7–15.5)
Lymphs Abs: 1251 cells/uL (ref 850–3900)
MCH: 25.5 pg — ABNORMAL LOW (ref 27.0–33.0)
MCHC: 31.6 g/dL — ABNORMAL LOW (ref 32.0–36.0)
MCV: 80.9 fL (ref 80.0–100.0)
MPV: 9.7 fL (ref 7.5–12.5)
Monocytes Relative: 7.3 %
Neutro Abs: 3534 cells/uL (ref 1500–7800)
Neutrophils Relative %: 59.9 %
Platelets: 375 10*3/uL (ref 140–400)
RBC: 4.7 10*6/uL (ref 3.80–5.10)
RDW: 14.2 % (ref 11.0–15.0)
Total Lymphocyte: 21.2 %
WBC: 5.9 10*3/uL (ref 3.8–10.8)

## 2019-11-01 LAB — SEDIMENTATION RATE: Sed Rate: 62 mm/h — ABNORMAL HIGH (ref 0–30)

## 2019-11-01 LAB — C-REACTIVE PROTEIN: CRP: 4.5 mg/L (ref <8.0)

## 2019-11-01 NOTE — Telephone Encounter (Signed)
Spoke with patient and made aware to follow up with Dr. Maureen Ralphs for knee aspiration and follow up with Dr. Tommy Medal in 2-3 weeks

## 2019-11-01 NOTE — Telephone Encounter (Signed)
Patient's daughter called back for clarification. RN spoke with her, made appointment for follow up here 12/15 at 11:15, called Dr Aluisio's office to make a follow up for her there 12/1 at 1:45 for aspiration with labs per Dr Tommy Medal.  RN routed office notes and lab results to Dr Aluisio's office.  Landis Gandy, RN

## 2019-11-07 ENCOUNTER — Other Ambulatory Visit: Payer: Self-pay | Admitting: Family Medicine

## 2019-11-08 DIAGNOSIS — M25561 Pain in right knee: Secondary | ICD-10-CM | POA: Diagnosis not present

## 2019-11-10 DIAGNOSIS — B351 Tinea unguium: Secondary | ICD-10-CM | POA: Diagnosis not present

## 2019-11-10 DIAGNOSIS — M79676 Pain in unspecified toe(s): Secondary | ICD-10-CM | POA: Diagnosis not present

## 2019-11-10 DIAGNOSIS — L84 Corns and callosities: Secondary | ICD-10-CM | POA: Diagnosis not present

## 2019-11-10 DIAGNOSIS — E1142 Type 2 diabetes mellitus with diabetic polyneuropathy: Secondary | ICD-10-CM | POA: Diagnosis not present

## 2019-11-22 ENCOUNTER — Other Ambulatory Visit: Payer: Self-pay

## 2019-11-22 ENCOUNTER — Ambulatory Visit (INDEPENDENT_AMBULATORY_CARE_PROVIDER_SITE_OTHER): Payer: Medicare Other | Admitting: Infectious Disease

## 2019-11-22 VITALS — BP 163/79 | HR 69

## 2019-11-22 DIAGNOSIS — Z8739 Personal history of other diseases of the musculoskeletal system and connective tissue: Secondary | ICD-10-CM

## 2019-11-22 DIAGNOSIS — A498 Other bacterial infections of unspecified site: Secondary | ICD-10-CM

## 2019-11-22 DIAGNOSIS — M76891 Other specified enthesopathies of right lower limb, excluding foot: Secondary | ICD-10-CM

## 2019-11-22 DIAGNOSIS — T8453XD Infection and inflammatory reaction due to internal right knee prosthesis, subsequent encounter: Secondary | ICD-10-CM | POA: Diagnosis not present

## 2019-11-22 NOTE — Progress Notes (Signed)
Subjective:  Chief complaint: Follow-up for right prosthetic knee infection   l Patient ID: Beth [name], female    DOB: 1946-05-21, 73 y.o.   MRN: [id number]  HPI  Beth [name] is a 73  y.o. 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  She apparently originally had right knee total arthroplasty in July 2015.  In the interim she had an I&D in October 2015 with poly-exchange followed by antibiotics.  She then had in 2016 a right knee resection arthroplasty with placement of antibiotic spacers followed by antibiotics and followed by reimplantation of new prosthetic knee.  Unfortunately she is again had failure and of the attempts to eradicate infection in his knee and her prosthetic knee has been removed and antibiotic spacer placed  Is my understanding that no organism had been isolated in the past.  Does not appear that we were ever involved with her care before thing when I can see and less it was in the paper record I cannot see any notes over the last several years in epic.  He subsequently grew a pansensitive pseudomonal species.  She was discharged on cefepime but then admitted with renal failure and changed to oral ciprofloxacin she was having trouble with her PICC line as well.  Renal function had improved during her hospitalization and she just continued on ciprofloxacin in the skilled nursing facility.  Knee pain is better now versus when she was in the hospital.  She was due to complete the ciprofloxacin on 10 January.  Ever in the interim she was seen by me and we rechecked inflammatory markers which were still quite high sed rate still in the 70s and CRP had gone up into the 20s.  Therefore I had continued her on oral ciprofloxacin in the nursing facility.  When she was discharged some skilled nurse facility on 20 January they unfortunately did not send her home with oral antibiotics.  Since then she continue oral ciprofloxacin.   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  [name] saw the patient in clinic reviewed films and had some concern whether or not the tendinosis seen on imaging could be related to chronic fluoroquinolone usage.  The patient's pain in her knee had improved as mentioned inflammatory markers had trended down.  Patient was taken off ciprofloxacin to see how she would do both in terms of her groin and hip pain and also her knee pain.  Since then her hip pain is improved dramatically suggesting the possibility that fluoroquinolones could have been in play in terms of causing a tendinopathy.  [name] DC the FQ at that time.  Her knee pain has remained stable off of antibiotics  Since August of 2020.  When I saw her last I was encouraged by how she was doing but when I checked her inflammatory markers they had become more elevated.  She has been seen by Dr. Maureen Ralphs and recounts him removing fluid from her knee and sending this for analysis and culture.  However when I went into "care everywhere portal the notes indicated that fluid cannot be withdrawn.  We will see if there was a second time that fluid might have been taken off the knee.  Dr. Maureen Ralphs also mentioned doing an MRI of the knee prior to considering reimplanting a new knee.  I think this is a reasonable idea and actually also reasonable since we are also concerned about persistent infection.  Patient tells me the knee pain is about the same as it has been and she even thought it might be better.  Her hip pain is resolved     Past Medical History:  Diagnosis Date  . Anemia   . Arthritis    Knee both knees  . Blood transfusion without reported diagnosis 2012   anemia;pt denies  transfusion stated was only on iron tablet  . Cataract    left  . CKD (chronic kidney disease), stage III   . CVA (cerebral infarction)    2006  . Diabetes mellitus without complication ([place])   . Family history of anesthesia complication    sister very slow to awaken after anesthesia;severe vomiting   . Gout    left elbow  . Groin pain 06/21/2019  . Hamstring tendinitis 08/22/2019  . Heart murmur   . 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 08-09-14 Right eye  . History of gout 01/04/2019  . Hyperlipidemia   . Hypertension   . Nocturia    3-4 times per night  . 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 Stroke [facility]) 2006   x 1 no deficits noted     Past Surgical History:  Procedure Laterality Date  . ABDOMINAL HYSTERECTOMY  1983  . CARPAL TUNNEL RELEASE Right 1983  . colonscopy  June 21, 2014  . EXCISIONAL TOTAL KNEE ARTHROPLASTY WITH ANTIBIOTIC SPACERS Right 02/16/2015   Procedure: RIGHT KNEE RESECTION ARTHROPLASTY WITH ANTIBIOTIC SPACERS;  Surgeon: [name], MD;  Location: [facility];  Service: Orthopedics;  Laterality: Right;  . EXCISIONAL TOTAL KNEE ARTHROPLASTY WITH ANTIBIOTIC SPACERS Right 11/03/2018   Procedure: Right knee resection arthroplasty; antibiotic spacer;  Surgeon: [name], MD;  Location: [facility];  Service: Orthopedics;  Laterality: Right;  Adductor Block  . I&D KNEE WITH POLY EXCHANGE Right 10/02/2014   Procedure: IRRIGATION AND DEBRIDEMENT RIGHT KNEE WITH POLY EXCHANGE;  Surgeon: Gearlean Alf, MD;  Location: [facility];  Service: Orthopedics;  Laterality: Right;  . INCISION AND DRAINAGE OF WOUND Right 01/14/2017   Procedure: IRRIGATION AND DEBRIDEMENT WOUND;  Surgeon: [name], MD;  Location: [facility];  Service: Orthopedics;  Laterality: Right;  requests 27mins  . JOINT REPLACEMENT  06/2014   right knee  . nasal cauterization  2012  . PATELLAR TENDON REPAIR Right 08/11/2014   Procedure: RIGHT  PATELLA TENDON REPAIR;  Surgeon: Gearlean Alf, MD;  Location: [facility];  Service: Orthopedics;  Laterality: Right;  . REIMPLANTATION OF TOTAL KNEE Right 05/23/2015   Procedure: RIGHT KNEE ARTHROPLASTY REIMPLANTATION;  Surgeon: [name], MD;  Location: [facility];  Service: Orthopedics;  Laterality: Right;  . TOTAL KNEE ARTHROPLASTY Right 07/03/2014   Procedure: RIGHT TOTAL KNEE ARTHROPLASTY;  Surgeon: Gearlean Alf, MD;  Location: [facility];  Service: Orthopedics;  Laterality: Right;  . TUBAL LIGATION      Family History  Problem Relation Age of Onset  . Ovarian cancer Mother   . Cancer Mother   . Diabetes Son   . Diabetes Son   . Peripheral vascular disease Father        with amputation of both legs  . Hypertension Father   . Heart disease Brother   . Kidney disease  Daughter   . Colon cancer Neg Hx   . Esophageal cancer Neg Hx   . Stomach cancer Neg Hx   . Rectal cancer Neg Hx       Social History   Socioeconomic History  . Marital status: Single    Spouse name: Not on file  . Number of children: 7  . Years of education: 8th  . Highest education level: 8th grade  Occupational History    Employer: RETIRED  Tobacco Use  . Smoking status: Never Smoker  . Smokeless tobacco: Never Used  Substance and Sexual Activity  . Alcohol use: No  . Drug use: No  . Sexual activity: Never  Other Topics Concern  . Not on file  Social History Narrative   Patient is widowed and lives with her granddaughter. She has 7 adult children and many grandchildren and great grandchildren. Patient is retired. 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    Social Determinants of Health   Financial Resource Strain:   . Difficulty of Paying Living Expenses: Not on file  Food Insecurity:   . Worried About Charity fundraiser in the Last Year: Not on file  . Ran Out of Food in the Last Year: Not on file  Transportation Needs:   . Lack of Transportation (Medical): Not on file  .  Lack of Transportation (Non-Medical): Not on file  Physical Activity:   . Days of Exercise per Week: Not on file  . Minutes of Exercise per Session: Not on file  Stress:   . Feeling of Stress : Not on file  Social Connections:   . Frequency of Communication with Friends and Family: Not on file  . Frequency of Social Gatherings with Friends and Family: Not on file  . Attends Religious Services: Not on file  . Active Member of Clubs or Organizations: Not on file  . Attends Archivist Meetings: Not on file  . Marital Status: Not on file    Allergies  Allergen Reactions  . Aleve [Naproxen Sodium] Other (See Comments)    Heart races  . Asa [Aspirin] Other (See Comments)    Nose bleeding  . Ciprofloxacin Other (See Comments)    Possible hamstring tendinopathy  . Clonidine Derivatives Other (See Comments)    Dizziness and weakness  . Shellfish Allergy Nausea And Vomiting     Current Outpatient Medications:  .  acetaminophen (TYLENOL) 500 MG tablet, Take 2 tablets (1,000 mg total) by mouth every 6 (six) hours as needed for mild pain., Disp: 90 tablet, Rfl: 1 .  amLODipine (NORVASC) 10 MG tablet, TAKE 1 TABLET DAILY IN THE EVENING, Disp: 90 tablet, Rfl: 1 .  atorvastatin (LIPITOR) 20 MG tablet, Take 1 tablet (20 mg total) by mouth every evening., Disp: 90 tablet, Rfl: 1 .  diltiazem (CARDIZEM CD) 240 MG 24 hr capsule, TAKE ONE (1) CAPSULE EACH DAY, Disp: 90 capsule, Rfl: 1 .  furosemide (LASIX) 20 MG tablet, Take 1 tablet (20 mg total) by mouth daily., Disp: 30 tablet, Rfl: 5 .  hydrALAZINE (APRESOLINE) 100 MG tablet, TAKE ONE (1) TABLET THREE (3) TIMES EACH DAY, Disp: 270 tablet, Rfl: 1 .  isosorbide dinitrate (ISORDIL) 10 MG tablet, TAKE ONE (1) TABLET THREE (3) TIMES EACH DAY, Disp: 90 tablet, Rfl: 0 .  metFORMIN (GLUCOPHAGE-XR) 500 MG 24 hr tablet, TAKE ONE (1) TABLET EACH DAY, Disp: 90 tablet, Rfl: 0 .  Nebivolol HCl (BYSTOLIC) 20 MG TABS, TAKE ONE (1)  TABLET EACH DAY,  Disp: 90 each, Rfl: 1 .  olmesartan (BENICAR) 40 MG tablet, Take 1 tablet (40 mg total) by mouth daily. (Needs to be seen before next refill), Disp: 30 tablet, Rfl: 0 .  ONE TOUCH ULTRA TEST test strip, Use to check glucose up to four times daily, Disp: 400 each, Rfl: 1 .  ONETOUCH DELICA LANCETS 99991111 MISC, USE TO CHECK GLUCOSE UP TO  4 TIMES DAILY, Disp: 400 each, Rfl: 0 .  oxyCODONE (OXY IR/ROXICODONE) 5 MG immediate release tablet, Take 1 tablet (5 mg total) by mouth every 6 (six) hours as needed for moderate pain (pain score 4-6)., Disp: 5 tablet, Rfl: 0 .  TRADJENTA 5 MG TABS tablet, TAKE ONE (1) TABLET EACH DAY, Disp: 30 tablet, Rfl: 5 No current facility-administered medications for this visit.  Facility-Administered Medications Ordered in Other Visits:  .  tranexamic acid (CYKLOKAPRON) 2,000 mg in sodium chloride 0.9 % 50 mL Topical Application, 123XX123 mg, Topical, Once, Aluisio, Frank, MD  Review of Systems  Constitutional: Negative for activity change, appetite change, chills, diaphoresis, fatigue, fever and unexpected weight change.  HENT: Negative for congestion, rhinorrhea, sinus pressure, sneezing, sore throat and trouble swallowing.   Eyes: Negative for photophobia and visual disturbance.  Respiratory: Negative for cough, chest tightness, shortness of breath, wheezing and stridor.   Cardiovascular: Negative for chest pain, palpitations and leg swelling.  Gastrointestinal: Negative for abdominal distention, abdominal pain, anal bleeding, blood in stool, constipation, diarrhea, nausea and vomiting.  Genitourinary: Negative for difficulty urinating, dysuria, flank pain and hematuria.  Musculoskeletal: Positive for arthralgias. Negative for back pain, joint swelling and myalgias.  Skin: Negative for color change, pallor and rash.  Neurological: Negative for dizziness, tremors, weakness and light-headedness.  Hematological: Negative for adenopathy. Does not bruise/bleed easily.    Psychiatric/Behavioral: Negative for agitation, behavioral problems, confusion, decreased concentration, dysphoric mood and sleep disturbance. The patient is not nervous/anxious.        Objective:   Physical Exam Constitutional:      General: She is not in acute distress.    Appearance: She is not diaphoretic.  HENT:     Head: Normocephalic and atraumatic.     Right Ear: External ear normal.     Left Ear: External ear normal.     Nose: Nose normal.  Eyes:     General: No scleral icterus.    Extraocular Movements: Extraocular movements intact.     Conjunctiva/sclera: Conjunctivae normal.  Cardiovascular:     Rate and Rhythm: Normal rate and regular rhythm.  Pulmonary:     Effort: Pulmonary effort is normal. No respiratory distress.  Abdominal:     General: There is no distension.  Musculoskeletal:        General: No tenderness.     Cervical back: Normal range of motion and neck supple.       Legs:  Lymphadenopathy:     Cervical: No cervical adenopathy.  Skin:    General: Skin is warm and dry.     Coloration: Skin is not pale.     Findings: No erythema or rash.  Neurological:     General: No focal deficit present.     Mental Status: She is alert and oriented to person, place, and time.     Coordination: Coordination normal.  Psychiatric:        Attention and Perception: Attention normal.        Mood and Affect: Mood normal. Mood is not anxious.  Behavior: Behavior normal.        Thought Content: Thought content normal.        Judgment: Judgment normal.      Assessment & Plan:    Right prosthetic joint infection status post multiple surgeries now status post explantation of prosthesis implantation of antibiotics beads and spacer along with 6 weeks of systemic antibiotics followed by months of ciprofloxacin  We will check inflammatory markers and I am also ordering an MRI with and without contrast of this knee  Right groin pain: Has resolved off of  fluoroquinolone raising question of tendinopathy could be also related to sacroiliac disease.   Marland Kitchen

## 2019-11-23 LAB — BASIC METABOLIC PANEL WITH GFR
BUN/Creatinine Ratio: 30 (calc) — ABNORMAL HIGH (ref 6–22)
BUN: 28 mg/dL — ABNORMAL HIGH (ref 7–25)
CO2: 24 mmol/L (ref 20–32)
Calcium: 9.6 mg/dL (ref 8.6–10.4)
Chloride: 105 mmol/L (ref 98–110)
Creat: 0.93 mg/dL (ref 0.60–0.93)
GFR, Est African American: 70 mL/min/{1.73_m2} (ref >=60)
GFR, Est Non African American: 60 mL/min/{1.73_m2} (ref >=60)
Glucose, Bld: 219 mg/dL — ABNORMAL HIGH (ref 65–99)
Potassium: 4.1 mmol/L (ref 3.5–5.3)
Sodium: 138 mmol/L (ref 135–146)

## 2019-11-23 LAB — CBC WITH DIFFERENTIAL/PLATELET
Absolute Monocytes: 398 cells/uL (ref 200–950)
Basophils Absolute: 50 cells/uL (ref 0–200)
Basophils Relative: 0.9 %
Eosinophils Absolute: 666 cells/uL — ABNORMAL HIGH (ref 15–500)
Eosinophils Relative: 11.9 %
HCT: 36.9 % (ref 35.0–45.0)
Hemoglobin: 11.4 g/dL — ABNORMAL LOW (ref 11.7–15.5)
Lymphs Abs: 1260 cells/uL (ref 850–3900)
MCH: 25.4 pg — ABNORMAL LOW (ref 27.0–33.0)
MCHC: 30.9 g/dL — ABNORMAL LOW (ref 32.0–36.0)
MCV: 82.4 fL (ref 80.0–100.0)
MPV: 10.2 fL (ref 7.5–12.5)
Monocytes Relative: 7.1 %
Neutro Abs: 3226 cells/uL (ref 1500–7800)
Neutrophils Relative %: 57.6 %
Platelets: 307 10*3/uL (ref 140–400)
RBC: 4.48 10*6/uL (ref 3.80–5.10)
RDW: 14.1 % (ref 11.0–15.0)
Total Lymphocyte: 22.5 %
WBC: 5.6 10*3/uL (ref 3.8–10.8)

## 2019-11-23 LAB — C-REACTIVE PROTEIN: CRP: 16.8 mg/L — ABNORMAL HIGH (ref <8.0)

## 2019-11-23 LAB — SEDIMENTATION RATE: Sed Rate: 58 mm/h — ABNORMAL HIGH (ref 0–30)

## 2019-11-29 ENCOUNTER — Ambulatory Visit (HOSPITAL_COMMUNITY): Admission: RE | Admit: 2019-11-29 | Payer: Medicare Other | Source: Ambulatory Visit

## 2019-12-08 ENCOUNTER — Ambulatory Visit (HOSPITAL_COMMUNITY): Admission: RE | Admit: 2019-12-08 | Payer: Medicare Other | Source: Ambulatory Visit

## 2019-12-08 ENCOUNTER — Encounter (HOSPITAL_COMMUNITY): Payer: Self-pay | Admitting: Emergency Medicine

## 2019-12-08 ENCOUNTER — Emergency Department (HOSPITAL_COMMUNITY)
Admission: EM | Admit: 2019-12-08 | Discharge: 2019-12-08 | Disposition: A | Discharge status: Home / Self Care | Payer: Medicare Other | Attending: Emergency Medicine | Admitting: Emergency Medicine

## 2019-12-08 ENCOUNTER — Emergency Department (HOSPITAL_COMMUNITY): Payer: Medicare Other

## 2019-12-08 ENCOUNTER — Other Ambulatory Visit: Payer: Self-pay

## 2019-12-08 DIAGNOSIS — Z79899 Other long term (current) drug therapy: Secondary | ICD-10-CM | POA: Insufficient documentation

## 2019-12-08 DIAGNOSIS — M5489 Other dorsalgia: Secondary | ICD-10-CM | POA: Diagnosis not present

## 2019-12-08 DIAGNOSIS — I10 Essential (primary) hypertension: Secondary | ICD-10-CM | POA: Diagnosis not present

## 2019-12-08 DIAGNOSIS — S161XXA Strain of muscle, fascia and tendon at neck level, initial encounter: Secondary | ICD-10-CM | POA: Diagnosis not present

## 2019-12-08 DIAGNOSIS — Z7984 Long term (current) use of oral hypoglycemic drugs: Secondary | ICD-10-CM | POA: Diagnosis not present

## 2019-12-08 DIAGNOSIS — M542 Cervicalgia: Secondary | ICD-10-CM | POA: Diagnosis not present

## 2019-12-08 DIAGNOSIS — R519 Headache, unspecified: Secondary | ICD-10-CM | POA: Diagnosis not present

## 2019-12-08 DIAGNOSIS — W19XXXA Unspecified fall, initial encounter: Secondary | ICD-10-CM

## 2019-12-08 DIAGNOSIS — Z96651 Presence of right artificial knee joint: Secondary | ICD-10-CM | POA: Diagnosis not present

## 2019-12-08 DIAGNOSIS — W01190A Fall on same level from slipping, tripping and stumbling with subsequent striking against furniture, initial encounter: Secondary | ICD-10-CM | POA: Diagnosis not present

## 2019-12-08 DIAGNOSIS — Y939 Activity, unspecified: Secondary | ICD-10-CM | POA: Insufficient documentation

## 2019-12-08 DIAGNOSIS — E119 Type 2 diabetes mellitus without complications: Secondary | ICD-10-CM | POA: Insufficient documentation

## 2019-12-08 DIAGNOSIS — Y92003 Bedroom of unspecified non-institutional (private) residence as the place of occurrence of the external cause: Secondary | ICD-10-CM | POA: Diagnosis not present

## 2019-12-08 DIAGNOSIS — S0990XA Unspecified injury of head, initial encounter: Secondary | ICD-10-CM | POA: Diagnosis not present

## 2019-12-08 DIAGNOSIS — Y999 Unspecified external cause status: Secondary | ICD-10-CM | POA: Diagnosis not present

## 2019-12-08 DIAGNOSIS — Z743 Need for continuous supervision: Secondary | ICD-10-CM | POA: Diagnosis not present

## 2019-12-08 DIAGNOSIS — R52 Pain, unspecified: Secondary | ICD-10-CM | POA: Diagnosis not present

## 2019-12-08 MED ORDER — HYDROCODONE-ACETAMINOPHEN 5-325 MG PO TABS
1.0000 | ORAL_TABLET | Freq: Once | ORAL | Status: AC
  Administered 2019-12-08: 1 via ORAL
  Filled 2019-12-08: qty 1

## 2019-12-08 MED ORDER — HYDROCODONE-ACETAMINOPHEN 5-325 MG PO TABS: 1.0000 | ORAL_TABLET | Freq: Four times a day (QID) | ORAL | 0 refills | Status: DC | PRN

## 2019-12-08 NOTE — ED Notes (Signed)
Beth [name]

## 2019-12-08 NOTE — Discharge Instructions (Addendum)
Work-up for the fall and the head injury and the pain to the left side of the neck and head without any acute findings based on CT scan head and neck.  Take pain medicine as directed.  Make an appointment to follow-up with your doctor for further evaluation if things do not improve in a few days.  Take the hydrocodone as directed.  For pain.

## 2019-12-08 NOTE — ED Provider Notes (Signed)
[place] Provider Note   CSN: [id number] Arrival date & time: 12/08/19  1117     History Chief Complaint  Patient presents with  . Fall    Beth [name] is a 73 y.o. female.  Patient status post fall 3 days ago happened at home.  Patient lost her balance fell into the nightstand striking the left side of her head and neck.  Now complaints of hit left head pain that radiates down the left neck.  Has been present since it occurred but just not getting better.  Patient able to walk she uses walker at home because she has her right leg in a fixator type apparatus secondary to knee replacement and infection she is followed by Ortho and infectious disease.  Patient denies any upper extremity or lower extremity weakness or any new numbness.  Patient states that the right leg is status quo.        Past Medical History:  Diagnosis Date  . Anemia   . Arthritis    Knee both knees  . Blood transfusion without reported diagnosis 2012   anemia;pt denies transfusion stated was only on iron tablet  . Cataract    left  . CKD (chronic kidney disease), stage III   . CVA (cerebral infarction)    2006  . Diabetes mellitus without complication ([place])   . Family history of anesthesia complication    sister very slow to awaken after anesthesia;severe vomiting   . Gout    left elbow  . Groin pain 06/21/2019  . Hamstring tendinitis 08/22/2019  . Heart murmur   . 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 08-09-14 Right eye  . History of gout 01/04/2019  . Hyperlipidemia   . Hypertension   . Nocturia    3-4 times per night  . 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 Stroke [facility]) 2006   x 1 no deficits noted     Patient Active Problem List   Diagnosis Date Noted  . Hamstring tendinitis of right thigh 08/22/2019  . 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 Right groin pain 06/21/2019  . History  of gout 01/04/2019  . 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 Diabetes type 2, controlled ([place]) 03/07/2016  . Gout   . Essential hypertension   . Enuresis 11/19/2015  . HLD (hyperlipidemia) 01/04/2014  . Anemia, iron deficiency 12/30/2011  . Constipation 04/04/2009  . TUBULOVILLOUS ADENOMA, COLON 04/03/2009    Past Surgical History:  Procedure Laterality Date  . ABDOMINAL HYSTERECTOMY  1983  . CARPAL TUNNEL RELEASE Right 1983  . colonscopy  June 21, 2014  . EXCISIONAL TOTAL KNEE ARTHROPLASTY WITH ANTIBIOTIC SPACERS Right 02/16/2015   Procedure: RIGHT KNEE RESECTION ARTHROPLASTY WITH ANTIBIOTIC SPACERS;  Surgeon: [name], MD;  Location: [facility];  Service: Orthopedics;  Laterality: Right;  . EXCISIONAL TOTAL KNEE ARTHROPLASTY WITH ANTIBIOTIC SPACERS Right 11/03/2018   Procedure: Right knee resection arthroplasty; antibiotic spacer;  Surgeon: [name], MD;  Location: [facility];  Service: Orthopedics;  Laterality: Right;  Adductor Block  . I & D KNEE WITH POLY EXCHANGE Right 10/02/2014   Procedure: IRRIGATION AND DEBRIDEMENT RIGHT KNEE WITH POLY EXCHANGE;  Surgeon: Gearlean Alf, MD;  Location: [facility];  Service: Orthopedics;  Laterality: Right;  . INCISION AND DRAINAGE OF WOUND Right 01/14/2017   Procedure: IRRIGATION AND DEBRIDEMENT WOUND;  Surgeon: Pilar Plate  Aluisio, MD;  Location: [facility];  Service: Orthopedics;  Laterality: Right;  requests 1mins  . JOINT REPLACEMENT  06/2014   right knee  . nasal cauterization  2012  . PATELLAR TENDON REPAIR Right 08/11/2014   Procedure: RIGHT PATELLA TENDON REPAIR;  Surgeon: Gearlean Alf, MD;  Location: [facility];  Service: Orthopedics;  Laterality: Right;  . REIMPLANTATION OF TOTAL KNEE Right 05/23/2015   Procedure: RIGHT KNEE ARTHROPLASTY REIMPLANTATION;  Surgeon: [name], MD;  Location: [facility];  Service: Orthopedics;  Laterality: Right;  . TOTAL KNEE ARTHROPLASTY Right  07/03/2014   Procedure: RIGHT TOTAL KNEE ARTHROPLASTY;  Surgeon: Gearlean Alf, MD;  Location: [facility];  Service: Orthopedics;  Laterality: Right;  . TUBAL LIGATION       OB History   No obstetric history on file.     Family History  Problem Relation Age of Onset  . Ovarian cancer Mother   . Cancer Mother   . Diabetes Son   . Diabetes Son   . Peripheral vascular disease Father        with amputation of both legs  . Hypertension Father   . Heart disease Brother   . Kidney disease Daughter   . Colon cancer Neg Hx   . Esophageal cancer Neg Hx   . Stomach cancer Neg Hx   . Rectal cancer Neg Hx     Social History   Tobacco Use  . Smoking status: Never Smoker  . Smokeless tobacco: Never Used  Substance Use Topics  . Alcohol use: No  . Drug use: No    Home Medications Prior to Admission medications   Medication Sig Start Date End Date Taking? Authorizing Provider  acetaminophen (TYLENOL) 500 MG tablet Take 2 tablets (1,000 mg total) by mouth every 6 (six) hours as needed for mild pain. 09/28/18   Claretta Fraise, MD  amLODipine (NORVASC) 10 MG tablet TAKE 1 TABLET DAILY IN THE EVENING 01/04/19   Claretta Fraise, MD  atorvastatin (LIPITOR) 20 MG tablet Take 1 tablet (20 mg total) by mouth every evening. 01/04/19   Claretta Fraise, MD  diltiazem (CARDIZEM CD) 240 MG 24 hr capsule TAKE ONE (1) CAPSULE EACH DAY 09/19/19   Claretta Fraise, MD  furosemide (LASIX) 20 MG tablet Take 1 tablet (20 mg total) by mouth daily. 01/04/19   Claretta Fraise, MD  hydrALAZINE (APRESOLINE) 100 MG tablet TAKE ONE (1) TABLET THREE (3) TIMES EACH DAY 01/04/19   Claretta Fraise, MD  HYDROcodone-acetaminophen (NORCO/VICODIN) 5-325 MG tablet Take 1 tablet by mouth every 6 (six) hours as needed. 12/08/19   Fredia Sorrow, MD  isosorbide dinitrate (ISORDIL) 10 MG tablet TAKE ONE (1) TABLET THREE (3) TIMES EACH DAY 11/07/19   Claretta Fraise, MD  metFORMIN (GLUCOPHAGE-XR) 500 MG 24 hr tablet TAKE ONE (1) TABLET  EACH DAY 09/29/19   Claretta Fraise, MD  Nebivolol HCl (BYSTOLIC) 20 MG TABS TAKE ONE (1) TABLET EACH DAY 01/04/19   Claretta Fraise, MD  olmesartan (BENICAR) 40 MG tablet Take 1 tablet (40 mg total) by mouth daily. (Needs to be seen before next refill) 10/24/19   Chevis Pretty, FNP  ONE Vania Rea ULTRA TEST test strip Use to check glucose up to four times daily 01/04/19   Claretta Fraise, MD  [facility] DELICA LANCETS [id number] MISC USE TO CHECK GLUCOSE UP TO  4 TIMES DAILY 12/09/17   Claretta Fraise, MD  oxyCODONE (OXY IR/ROXICODONE) 5 MG immediate release tablet Take 1 tablet (5 mg total) by mouth every  6 (six) hours as needed for moderate pain (pain score 4-6). 12/09/18   Donne Hazel, MD  TRADJENTA 5 MG TABS tablet TAKE ONE (1) TABLET EACH DAY 07/18/19   Claretta Fraise, MD    Allergies    Aleve [naproxen sodium], Asa [aspirin], Ciprofloxacin, Clonidine derivatives, and Shellfish allergy  Review of Systems   Review of Systems  Constitutional: Negative for chills and fever.  HENT: Negative for congestion, rhinorrhea and sore throat.   Eyes: Negative for visual disturbance.  Respiratory: Negative for cough and shortness of breath.   Cardiovascular: Negative for chest pain and leg swelling.  Gastrointestinal: Negative for abdominal pain, diarrhea, nausea and vomiting.  Genitourinary: Negative for dysuria.  Musculoskeletal: Positive for neck pain. Negative for back pain.  Skin: Negative for rash and wound.  Neurological: Positive for headaches. Negative for dizziness and light-headedness.  Hematological: Does not bruise/bleed easily.  Psychiatric/Behavioral: Negative for confusion.    Physical Exam Updated Vital Signs BP (!) 187/104 (BP Location: Right Arm)   Pulse 85   Temp 98.4 F (36.9 C) (Oral)   Resp 18   Ht 1.575 m (5\' 2" )   Wt 95 kg   SpO2 95%   BMI 38.31 kg/m   Physical Exam Vitals and nursing note reviewed.  Constitutional:      General: She is not in acute distress.     Appearance: Normal appearance. She is well-developed.  HENT:     Head: Normocephalic and atraumatic.  Eyes:     Extraocular Movements: Extraocular movements intact.     Conjunctiva/sclera: Conjunctivae normal.     Pupils: Pupils are equal, round, and reactive to light.  Neck:     Comments: Tenderness to palpation lateral aspect of the neck on the left. Cardiovascular:     Rate and Rhythm: Normal rate and regular rhythm.     Heart sounds: No murmur.  Pulmonary:     Effort: Pulmonary effort is normal. No respiratory distress.     Breath sounds: Normal breath sounds.  Abdominal:     Palpations: Abdomen is soft.     Tenderness: There is no abdominal tenderness.  Musculoskeletal:        General: Normal range of motion.     Cervical back: Normal range of motion and neck supple. Tenderness present.     Comments: Left knee in a knee immobilizer fixator type arrangement secondary to the infection.  Distally neurovascularly intact.  Skin:    General: Skin is warm and dry.  Neurological:     General: No focal deficit present.     Mental Status: She is alert and oriented to person, place, and time.     Cranial Nerves: No cranial nerve deficit.     Sensory: No sensory deficit.     Motor: No weakness.     ED Results / Procedures / Treatments   Labs (all labs ordered are listed, but only abnormal results are displayed) Labs Reviewed - No data to display  EKG None  Radiology CT Head Wo Contrast  Result Date: 12/08/2019 CLINICAL DATA:  Left-sided head pain after fall 3 days ago EXAM: CT HEAD WITHOUT CONTRAST TECHNIQUE: Contiguous axial images were obtained from the base of the skull through the vertex without intravenous contrast. COMPARISON:  02/28/2016 FINDINGS: Brain: No evidence of acute infarction, hemorrhage, hydrocephalus, extra-axial collection or mass lesion/mass effect. Chronic bilateral basal ganglia lacunar infarcts. Scattered low-density changes within the periventricular and  subcortical white matter compatible with chronic microvascular ischemic change. Mild  diffuse cerebral volume loss. Vascular: Mild atherosclerotic calcifications involving the large vessels of the skull base. No unexpected hyperdense vessel. Skull: Normal. Negative for fracture or focal lesion. Sinuses/Orbits: Dense opacification of the bilateral maxillary, ethmoid, and frontal sinuses. Mastoid air cells are clear. Orbital structures grossly unremarkable. Other: None. IMPRESSION: 1. No acute intracranial abnormality. 2. Chronic microvascular ischemic changes and cerebral volume loss. 3. Chronic bilateral basal ganglia lacunar infarcts. 4. Pansinusitis. Electronically Signed   By: Davina Poke D.O.   On: 12/08/2019 14:06   CT Cervical Spine Wo Contrast  Result Date: 12/08/2019 CLINICAL DATA:  Left-sided neck pain after fall 3 days ago EXAM: CT CERVICAL SPINE WITHOUT CONTRAST TECHNIQUE: Multidetector CT imaging of the cervical spine was performed without intravenous contrast. Multiplanar CT image reconstructions were also generated. COMPARISON:  02/29/2016 FINDINGS: Alignment: Cervical spine alignment is maintained without traumatic listhesis. Facet joints aligned. Dens and lateral masses aligned. Straightening of the cervical lordosis, which may be positional or secondary to muscular strain. Skull base and vertebrae: No acute fracture. No primary bone lesion or focal pathologic process. Soft tissues and spinal canal: No prevertebral fluid or swelling. No visible canal hematoma. Disc levels: Intervertebral disc height loss with degenerative endplate changes throughout the cervical spine. Relatively mild multilevel facet arthrosis. No evidence of high-grade canal stenosis by CT. Upper chest: Lung apices clear. Other: None. IMPRESSION: 1. No acute fracture or malalignment of the cervical spine. 2. Straightening of the cervical lordosis, which may be positional or secondary to muscular strain. 3. Multilevel  degenerative disc disease and facet arthrosis. Electronically Signed   By: Davina Poke D.O.   On: 12/08/2019 14:11    Procedures Procedures (including critical care time)  Medications Ordered in ED Medications  HYDROcodone-acetaminophen (NORCO/VICODIN) 5-325 MG per tablet 1 tablet (1 tablet Oral Given 12/08/19 1358)    ED Course  I have reviewed the triage vital signs and the nursing notes.  Pertinent labs & imaging results that were available during my care of the patient were reviewed by me and considered in my medical decision making (see chart for details).    MDM Rules/Calculators/A&P                       Patient states that she had a fall at home inside the house 3 days ago hit the left side of her head and neck on the nightstand.  Denies any upper extremity or lower extremity weakness or numbness.  Patient able to ambulate.  Patient has a walker at home.  She has a walker because of her right knee which is being followed by infectious disease and Ortho for infection following knee replacement.  Patient states no change in that.    Final Clinical Impression(s) / ED Diagnoses Final diagnoses:  Fall, initial encounter  Injury of head, initial encounter  Cervical strain, acute, initial encounter    Rx / DC Orders ED Discharge Orders         Ordered    HYDROcodone-acetaminophen (NORCO/VICODIN) 5-325 MG tablet  Every 6 hours PRN     12/08/19 1511           Fredia Sorrow, MD 12/08/19 1515

## 2019-12-08 NOTE — ED Triage Notes (Signed)
Per EMS patient from home called out due to fall that happened 3 days ago. Patient complains of left head pain that radiates down to left neck. Denies numbness. EMS states patient can walk. Patient states she walks at home with walker but is unable to bend right leg due to fixator at knee.

## 2019-12-11 ENCOUNTER — Encounter (HOSPITAL_COMMUNITY): Payer: Self-pay | Admitting: Emergency Medicine

## 2019-12-11 ENCOUNTER — Other Ambulatory Visit: Payer: Self-pay

## 2019-12-11 ENCOUNTER — Emergency Department (HOSPITAL_COMMUNITY)
Admission: EM | Admit: 2019-12-11 | Discharge: 2019-12-12 | Disposition: A | Discharge status: Home / Self Care | Payer: Medicare HMO | Attending: Emergency Medicine | Admitting: Emergency Medicine

## 2019-12-11 ENCOUNTER — Emergency Department (HOSPITAL_COMMUNITY): Payer: Medicare HMO

## 2019-12-11 DIAGNOSIS — G8929 Other chronic pain: Secondary | ICD-10-CM | POA: Diagnosis not present

## 2019-12-11 DIAGNOSIS — Z7984 Long term (current) use of oral hypoglycemic drugs: Secondary | ICD-10-CM | POA: Insufficient documentation

## 2019-12-11 DIAGNOSIS — W19XXXA Unspecified fall, initial encounter: Secondary | ICD-10-CM

## 2019-12-11 DIAGNOSIS — S0990XA Unspecified injury of head, initial encounter: Secondary | ICD-10-CM | POA: Diagnosis not present

## 2019-12-11 DIAGNOSIS — Z8673 Personal history of transient ischemic attack (TIA), and cerebral infarction without residual deficits: Secondary | ICD-10-CM | POA: Insufficient documentation

## 2019-12-11 DIAGNOSIS — Z96651 Presence of right artificial knee joint: Secondary | ICD-10-CM | POA: Diagnosis not present

## 2019-12-11 DIAGNOSIS — M542 Cervicalgia: Secondary | ICD-10-CM | POA: Diagnosis not present

## 2019-12-11 DIAGNOSIS — E1122 Type 2 diabetes mellitus with diabetic chronic kidney disease: Secondary | ICD-10-CM | POA: Insufficient documentation

## 2019-12-11 DIAGNOSIS — R42 Dizziness and giddiness: Secondary | ICD-10-CM | POA: Diagnosis not present

## 2019-12-11 DIAGNOSIS — N183 Chronic kidney disease, stage 3 unspecified: Secondary | ICD-10-CM | POA: Insufficient documentation

## 2019-12-11 DIAGNOSIS — M25561 Pain in right knee: Secondary | ICD-10-CM | POA: Insufficient documentation

## 2019-12-11 DIAGNOSIS — R519 Headache, unspecified: Secondary | ICD-10-CM | POA: Diagnosis not present

## 2019-12-11 DIAGNOSIS — I129 Hypertensive chronic kidney disease with stage 1 through stage 4 chronic kidney disease, or unspecified chronic kidney disease: Secondary | ICD-10-CM | POA: Insufficient documentation

## 2019-12-11 DIAGNOSIS — Z79899 Other long term (current) drug therapy: Secondary | ICD-10-CM | POA: Insufficient documentation

## 2019-12-11 LAB — CBC WITH DIFFERENTIAL/PLATELET
Abs Immature Granulocytes: 0.03 10*3/uL (ref 0.00–0.07)
Basophils Absolute: 0 10*3/uL (ref 0.0–0.1)
Basophils Relative: 1 %
Eosinophils Absolute: 0.6 10*3/uL — ABNORMAL HIGH (ref 0.0–0.5)
Eosinophils Relative: 7 %
HCT: 39.5 % (ref 36.0–46.0)
Hemoglobin: 12 g/dL (ref 12.0–15.0)
Immature Granulocytes: 0 %
Lymphocytes Relative: 22 %
Lymphs Abs: 1.8 10*3/uL (ref 0.7–4.0)
MCH: 25.4 pg — ABNORMAL LOW (ref 26.0–34.0)
MCHC: 30.4 g/dL (ref 30.0–36.0)
MCV: 83.7 fL (ref 80.0–100.0)
Monocytes Absolute: 0.7 10*3/uL (ref 0.1–1.0)
Monocytes Relative: 9 %
Neutro Abs: 5 10*3/uL (ref 1.7–7.7)
Neutrophils Relative %: 61 %
Platelets: 361 10*3/uL (ref 150–400)
RBC: 4.72 MIL/uL (ref 3.87–5.11)
RDW: 14.8 % (ref 11.5–15.5)
WBC: 8.1 10*3/uL (ref 4.0–10.5)
nRBC: 0 % (ref 0.0–0.2)

## 2019-12-11 LAB — URINALYSIS, ROUTINE W REFLEX MICROSCOPIC
Bilirubin Urine: NEGATIVE
Glucose, UA: NEGATIVE mg/dL
Hgb urine dipstick: NEGATIVE
Ketones, ur: NEGATIVE mg/dL
Leukocytes,Ua: NEGATIVE
Nitrite: NEGATIVE
Protein, ur: 100 mg/dL — AB
Specific Gravity, Urine: 1.014 (ref 1.005–1.030)
pH: 6 (ref 5.0–8.0)

## 2019-12-11 MED ORDER — HYDROCODONE-ACETAMINOPHEN 5-325 MG PO TABS
1.0000 | ORAL_TABLET | Freq: Once | ORAL | Status: AC
  Administered 2019-12-11: 1 via ORAL
  Filled 2019-12-11: qty 1

## 2019-12-11 NOTE — ED Provider Notes (Signed)
[facility] DEPT Provider Note   CSN: [id number] Arrival date & time: 12/11/19  1317    History Chief Complaint  Patient presents with  . Fall   Beth [name] is a 74 y.o. 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 Maureen Ralphs and Dr. Drucilla Schmidt from [facility] who presents for evaluation of fall.  Patient states she had a fall on New Year's Eve.  Patient states she is only able to walk with a walker at baseline due to her chronic knee pain from her prior prosthetic.  She states her right lower extremity is at her baseline.  Patient states today she did have some lightheaded and dizziness which caused her to fall.  Patient denies any syncopal event.  She is unsure if she hit her head or had LOC.  Patient states she has had left hand numbness x6 weeks.  Patient states she has had headache and left neck pain over the last 2 weeks. Unsure if this is due to her multiple falls. She denies any neck stiffness or neck rigidity.  She denies any preceding sudden onset thunderclap headache, chest pain, shortness breath, abdominal pain, diarrhea, dysuria, fever, chills, nausea, vomiting, weakness, facial droop.  No eye pain, blurred vision, temporal artery pain.  Denies additional aggravating or alleviating factors.  History obtained from patient and past medical records.  No interpreter is used.  Walks with a walker at baseline.  She lives alone however her family does live close by and helps her.  HPI     Past Medical History:  Diagnosis Date  . Anemia   . Arthritis    Knee both knees  . Blood transfusion without reported diagnosis 2012   anemia;pt denies transfusion stated was only on iron tablet  . Cataract    left  . CKD (chronic kidney disease), stage III   . CVA (cerebral infarction)    2006  . Diabetes mellitus without complication ([place])   . Family history of anesthesia  complication    sister very slow to awaken after anesthesia;severe vomiting   . Gout    left elbow  . Groin pain 06/21/2019  . Hamstring tendinitis 08/22/2019  . Heart murmur   . 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 08-09-14 Right eye  . History of gout 01/04/2019  . Hyperlipidemia   . Hypertension   . Nocturia    3-4 times per night  . 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 Stroke [facility]) 2006   x 1 no deficits noted     Patient Active Problem List   Diagnosis Date Noted  . Hamstring tendinitis of right thigh 08/22/2019  . 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 Right groin pain 06/21/2019  . History of gout 01/04/2019  . 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 Diabetes type 2, controlled ([place]) 03/07/2016  . Gout   . Essential hypertension   . Enuresis 11/19/2015  . HLD (hyperlipidemia) 01/04/2014  . Anemia, iron deficiency 12/30/2011  . Constipation 04/04/2009  . TUBULOVILLOUS ADENOMA, COLON 04/03/2009    Past Surgical History:  Procedure Laterality Date  . ABDOMINAL HYSTERECTOMY  1983  . CARPAL TUNNEL RELEASE Right 1983  . colonscopy  June 21, 2014  . EXCISIONAL TOTAL KNEE ARTHROPLASTY WITH ANTIBIOTIC SPACERS Right 02/16/2015   Procedure: RIGHT  KNEE RESECTION ARTHROPLASTY WITH ANTIBIOTIC SPACERS;  Surgeon: [name], MD;  Location: [facility];  Service: Orthopedics;  Laterality: Right;  . EXCISIONAL TOTAL KNEE ARTHROPLASTY WITH ANTIBIOTIC SPACERS Right 11/03/2018   Procedure: Right knee resection arthroplasty; antibiotic spacer;  Surgeon: [name], MD;  Location: [facility];  Service: Orthopedics;  Laterality: Right;  Adductor Block  . I & D KNEE WITH POLY EXCHANGE Right 10/02/2014   Procedure: IRRIGATION AND DEBRIDEMENT RIGHT KNEE WITH POLY EXCHANGE;  Surgeon: Gearlean Alf, MD;  Location: [facility];  Service:  Orthopedics;  Laterality: Right;  . INCISION AND DRAINAGE OF WOUND Right 01/14/2017   Procedure: IRRIGATION AND DEBRIDEMENT WOUND;  Surgeon: [name], MD;  Location: [facility];  Service: Orthopedics;  Laterality: Right;  requests 90mins  . JOINT REPLACEMENT  06/2014   right knee  . nasal cauterization  2012  . PATELLAR TENDON REPAIR Right 08/11/2014   Procedure: RIGHT PATELLA TENDON REPAIR;  Surgeon: Gearlean Alf, MD;  Location: [facility];  Service: Orthopedics;  Laterality: Right;  . REIMPLANTATION OF TOTAL KNEE Right 05/23/2015   Procedure: RIGHT KNEE ARTHROPLASTY REIMPLANTATION;  Surgeon: [name], MD;  Location: [facility];  Service: Orthopedics;  Laterality: Right;  . TOTAL KNEE ARTHROPLASTY Right 07/03/2014   Procedure: RIGHT TOTAL KNEE ARTHROPLASTY;  Surgeon: Gearlean Alf, MD;  Location: [facility];  Service: Orthopedics;  Laterality: Right;  . TUBAL LIGATION       OB History   No obstetric history on file.     Family History  Problem Relation Age of Onset  . Ovarian cancer Mother   . Cancer Mother   . Diabetes Son   . Diabetes Son   . Peripheral vascular disease Father        with amputation of both legs  . Hypertension Father   . Heart disease Brother   . Kidney disease Daughter   . Colon cancer Neg Hx   . Esophageal cancer Neg Hx   . Stomach cancer Neg Hx   . Rectal cancer Neg Hx     Social History   Tobacco Use  . Smoking status: Never Smoker  . Smokeless tobacco: Never Used  Substance Use Topics  . Alcohol use: No  . Drug use: No    Home Medications Prior to Admission medications   Medication Sig Start Date End Date Taking? Authorizing Provider  acetaminophen (TYLENOL) 500 MG tablet Take 2 tablets (1,000 mg total) by mouth every 6 (six) hours as needed for mild pain. 09/28/18  Yes Stacks, Cletus Gash, MD  amLODipine (NORVASC) 10 MG tablet TAKE 1 TABLET DAILY IN THE EVENING Patient taking differently: Take 10 mg by mouth every evening.  01/04/19  Yes Claretta Fraise, MD  atorvastatin (LIPITOR) 20 MG tablet Take 1 tablet (20 mg total) by mouth every evening. 01/04/19  Yes Stacks, Cletus Gash, MD  cholecalciferol (VITAMIN D3) 25 MCG (1000 UT) tablet Take 1,000 Units by mouth daily.   Yes [provider]  Cod Liver Oil CAPS Take 1 capsule by mouth daily.   Yes [provider]  diltiazem (CARDIZEM CD) 240 MG 24 hr capsule TAKE ONE (1) CAPSULE EACH DAY Patient taking differently: Take 240 mg by mouth daily.  09/19/19  Yes Claretta Fraise, MD  ferrous sulfate 325 (65 FE) MG tablet Take 325 mg by mouth daily.   Yes [provider]  furosemide (LASIX) 20 MG tablet Take 1 tablet (20 mg total) by mouth daily. 01/04/19  Yes Claretta Fraise, MD  hydrALAZINE (APRESOLINE) 100 MG tablet TAKE ONE (1) TABLET THREE (3) TIMES EACH DAY Patient taking differently: Take 100 mg by mouth 3 (three) times daily.  01/04/19  Yes Stacks, Cletus Gash, MD  HYDROcodone-acetaminophen (NORCO/VICODIN) 5-325 MG tablet Take 1 tablet by mouth every 6 (six) hours as needed. Patient taking differently: Take 1 tablet by mouth every 6 (six) hours as needed for moderate pain or severe pain.  12/08/19  Yes Fredia Sorrow, MD  isosorbide dinitrate (ISORDIL) 10 MG tablet TAKE ONE (1) TABLET THREE (3) TIMES EACH DAY Patient taking differently: Take 10 mg by mouth 3 (three) times daily.  11/07/19  Yes Stacks, Cletus Gash, MD  metFORMIN (GLUCOPHAGE-XR) 500 MG 24 hr tablet TAKE ONE (1) TABLET EACH DAY Patient taking differently: Take 500 mg by mouth daily.  09/29/19  Yes Stacks, Cletus Gash, MD  Nebivolol HCl (BYSTOLIC) 20 MG TABS TAKE ONE (1) TABLET EACH DAY Patient taking differently: Take 20 mg by mouth daily.  01/04/19  Yes Stacks, Cletus Gash, MD  olmesartan (BENICAR) 40 MG tablet Take 1 tablet (40 mg total) by mouth daily. (Needs to be seen before next refill) 10/24/19  Yes Hassell Done, Mary-Margaret, FNP  ONE TOUCH ULTRA TEST test strip Use to check glucose up to four times daily 01/04/19  Yes Stacks,  Cletus Gash, MD  [facility] DELICA LANCETS [id number] MISC USE TO CHECK GLUCOSE UP TO  4 TIMES DAILY 12/09/17  Yes Claretta Fraise, MD  TRADJENTA 5 MG TABS tablet TAKE ONE (1) TABLET EACH DAY Patient taking differently: Take 5 mg by mouth daily.  07/18/19  Yes Claretta Fraise, MD  oxyCODONE (OXY IR/ROXICODONE) 5 MG immediate release tablet Take 1 tablet (5 mg total) by mouth every 6 (six) hours as needed for moderate pain (pain score 4-6). Patient not taking: Reported on 12/11/2019 12/09/18   Donne Hazel, MD   Allergies    Aleve [naproxen sodium], Asa [aspirin], Ciprofloxacin, Clonidine derivatives, and Shellfish allergy  Review of Systems   Review of Systems  Constitutional: Negative.   HENT: Negative.   Respiratory: Negative.   Cardiovascular: Negative.   Gastrointestinal: Negative.   Genitourinary: Negative.   Musculoskeletal: Negative.   Skin: Negative.   Neurological: Positive for dizziness, light-headedness and headaches.  All other systems reviewed and are negative.  Physical Exam Updated Vital Signs BP (!) 188/99   Pulse 66   Temp 98 F (36.7 C) (Oral)   Resp 16   Ht 5\' 6"  (1.676 m)   Wt 95.3 kg   SpO2 95%   BMI 33.89 kg/m   Physical Exam Vitals and nursing note reviewed.  Constitutional:      General: She is not in acute distress.    Appearance: She is well-developed. She is ill-appearing (Chronically ill appearing). She is not toxic-appearing.  HENT:     Head: Normocephalic and atraumatic. No raccoon eyes, Battle's sign, contusion, right periorbital erythema or left periorbital erythema.     Jaw: There is normal jaw occlusion.      Comments: Tenderness to palpation to left forehead.  No tenderness over temporal artery region.    Right Ear: Tympanic membrane, ear canal and external ear normal. No hemotympanum.     Left Ear: Tympanic membrane, ear canal and external ear normal. No hemotympanum.     Mouth/Throat:     Lips: Pink.     Mouth: Mucous membranes are moist.  Eyes:      Extraocular Movements: Extraocular movements intact.     Pupils: Pupils are equal, round, and reactive  to light.     Comments: EOMs intact.  PERRLA.  Neck:     Vascular: No JVD.     Trachea: Phonation normal.   Cardiovascular:     Rate and Rhythm: Normal rate.     Pulses: Normal pulses.     Heart sounds: Normal heart sounds.  Pulmonary:     Effort: Pulmonary effort is normal. No respiratory distress.     Breath sounds: Normal breath sounds and air entry.     Comments: Speaks in full sentences without difficulty Abdominal:     General: There is no distension.     Palpations: Abdomen is soft.     Tenderness: There is no abdominal tenderness.     Comments: Soft non tender  Musculoskeletal:     Cervical back: Full passive range of motion without pain and normal range of motion.     Right knee: No swelling, erythema or bony tenderness. Decreased range of motion. No tenderness.     Left knee: Normal.     Comments: Moves bilateral upper extremities without difficulty.  Mild cervical tenderness to left paraspinal region.  No midline thoracic, lumbar tenderness palpation.  Pelvis stable, nontender palpation.  No shortening or rotation of legs.  Patient unable to bend at right knee, chronic in nature.  Patient with old surgical scar to right anterior knee.  No redness, edema.  No bleeding or drainage from surgical site.  Feet:     Comments: Edema to bilateral lower extremities without difficulty. Lymphadenopathy:     Cervical: No cervical adenopathy.  Skin:    General: Skin is warm and dry.     Capillary Refill: Capillary refill takes less than 2 seconds.     Comments: Brisk cap refill  Neurological:     Mental Status: She is alert.     Comments: Mental Status:  Alert, oriented, thought content appropriate. Speech fluent without evidence of aphasia. Able to follow 2 step commands without difficulty.  Cranial Nerves:  II:  Peripheral visual fields grossly normal, pupils equal, round,  reactive to light III,IV, VI: ptosis not present, extra-ocular motions intact bilaterally  V,VII: smile symmetric, facial light touch sensation equal VIII: hearing grossly normal bilaterally  IX,X: midline uvula rise  XI: bilateral shoulder shrug equal and strong XII: midline tongue extension  Motor:  5/5 in upper and lower extremities bilaterally including strong and equal grip strength and dorsiflexion/plantar flexion Sensory: Pinprick and light touch normal in all extremities.  Deep Tendon Reflexes: 2+ and symmetric  Cerebellar: normal finger-to-nose with bilateral upper extremities Gait: Abnormal gait secondary to chronic leg pain CV: distal pulses palpable throughout      ED Results / Procedures / Treatments   Labs (all labs ordered are listed, but only abnormal results are displayed) Labs Reviewed  CBC WITH DIFFERENTIAL/PLATELET - Abnormal; Notable for the following components:      Result Value   MCH 25.4 (*)    Eosinophils Absolute 0.6 (*)    All other components within normal limits  COMPREHENSIVE METABOLIC PANEL - Abnormal; Notable for the following components:   Glucose, Bld 119 (*)    BUN 30 (*)    Total Protein 8.5 (*)    Albumin 3.4 (*)    Total Bilirubin 1.6 (*)    All other components within normal limits  URINALYSIS, ROUTINE W REFLEX MICROSCOPIC - Abnormal; Notable for the following components:   Protein, ur 100 (*)    Bacteria, UA RARE (*)    All other  components within normal limits    EKG EKG Interpretation  Date/Time:  Sunday December 11 2019 19:55:11 EST Ventricular Rate:  61 PR Interval:    QRS Duration: 100 QT Interval:  434 QTC Calculation: 438 R Axis:   -24 Text Interpretation: Sinus rhythm Prolonged PR interval Probable left atrial enlargement Abnormal R-wave progression, late transition Left ventricular hypertrophy Baseline wander in lead(s) V2 Confirmed by Quintella Reichert [phone]) on 12/11/2019 8:07:20 PM   Radiology No results found.   Procedures Procedures (including critical care time)  Medications Ordered in ED Medications  HYDROcodone-acetaminophen (NORCO/VICODIN) 5-325 MG per tablet 1 tablet (1 tablet Oral Given 12/11/19 2248)    ED Course  I have reviewed the triage vital signs and the nursing notes.  Pertinent labs & imaging results that were available during my care of the patient were reviewed by me and considered in my medical decision making (see chart for details).  74 year old female presents for evaluation of fall.  She currently being followed by Dr. Drucilla Schmidt, with ID as well as Dr. Maureen Ralphs for infected right knee prosthesis.  Patient states she has no complaints of this.  She states she fell on New Year's Eve.  Was seen in the emergency department and [facility] home.  Patient states today she felt dizzy and had additional fall.  She is unsure if she hit her head or had LOC.  She states she has had chronic left hand numbness for around 6 weeks.  No preceding sudden onset thunderclap headache, chest pain, shortness of breath.  Patient was all 4 extremities at difficulty.  She has no shortening or rotation of legs.  She does have old right knee incision however this is without bleeding, drainage.  No evidence of cellulitis.  No bony tenderness to thoracic, lumbar spine on exam.  Pelvis stable, nontender to palpation.  No shortening or rotation of legs.  Plan for labs, urine, imaging.  Delay in labs secondary to difficult IV stick.  CBC without leukocytosis CMP without acute findings Urinalysis negative for infection  Patient seen eval by Dr. Ralene Bathe.  Clinical Course as of Dec 11 49  Sun Dec 11, 2019  2335 No STEMI  EKG 12-Lead [BH]  Mon Dec 12, 2019  [id number] Negative for infection  Urinalysis, Routine w reflex microscopic(!) [BH]  0047 Mildly elevated glucose at 30, hyperglycemia 118, elevated bili however abdomen soft, nontender, negative Murphy sign.  Comprehensive metabolic panel(!) [BH]  [id number] CBC without  leukocytosis, hemoglobin stable.  CBC with Differential(!) [BH]    Clinical Course User Index [BH] ,  A, PA-C    Care transferred to [facility] [place] who will follow up on imaging and ambulation. Disposition per [place], PA-C however if imaging negative and able to ambulate likely dc home with close outpatient follow up. Patient will need to follow up for her previously scheduled MR with Dr. Tommy Medal  Patient seen and evaluated by attending physician, Dr. Ralene Bathe who agrees with above treatment, plan and disposition.    MDM Rules/Calculators/A&P                      Final Clinical Impression(s) / ED Diagnoses Final diagnoses:  Fall, initial encounter  Chronic pain of right knee    Rx / DC Orders ED Discharge Orders    None       ,  A, PA-C 12/12/19 0051    Quintella Reichert, MD 12/15/19 [phone]

## 2019-12-11 NOTE — ED Notes (Signed)
Phlebotomy called to collect lab work

## 2019-12-11 NOTE — ED Triage Notes (Signed)
Per pt, states she fell on New Years eve-states she fell again today-states her walker got ahead of her-complaining of left neck and back pain

## 2019-12-12 ENCOUNTER — Emergency Department (HOSPITAL_COMMUNITY): Payer: Medicare HMO

## 2019-12-12 DIAGNOSIS — R519 Headache, unspecified: Secondary | ICD-10-CM | POA: Diagnosis not present

## 2019-12-12 DIAGNOSIS — S0990XA Unspecified injury of head, initial encounter: Secondary | ICD-10-CM | POA: Diagnosis not present

## 2019-12-12 DIAGNOSIS — M542 Cervicalgia: Secondary | ICD-10-CM | POA: Diagnosis not present

## 2019-12-12 LAB — COMPREHENSIVE METABOLIC PANEL
ALT: 11 U/L (ref 0–44)
AST: 34 U/L (ref 15–41)
Albumin: 3.4 g/dL — ABNORMAL LOW (ref 3.5–5.0)
Alkaline Phosphatase: 107 U/L (ref 38–126)
Anion gap: 11 (ref 5–15)
BUN: 30 mg/dL — ABNORMAL HIGH (ref 8–23)
CO2: 25 mmol/L (ref 22–32)
Calcium: 9.4 mg/dL (ref 8.9–10.3)
Chloride: 101 mmol/L (ref 98–111)
Creatinine, Ser: 0.93 mg/dL (ref 0.44–1.00)
GFR calc Af Amer: >60 mL/min (ref >60)
GFR calc non Af Amer: >60 mL/min (ref >60)
Glucose, Bld: 119 mg/dL — ABNORMAL HIGH (ref 70–99)
Potassium: 5.1 mmol/L (ref 3.5–5.1)
Sodium: 137 mmol/L (ref 135–145)
Total Bilirubin: 1.6 mg/dL — ABNORMAL HIGH (ref 0.3–1.2)
Total Protein: 8.5 g/dL — ABNORMAL HIGH (ref 6.5–8.1)

## 2019-12-12 MED ORDER — HYDRALAZINE HCL 50 MG PO TABS
100.0000 mg | ORAL_TABLET | Freq: Three times a day (TID) | ORAL | Status: AC
  Administered 2019-12-12: 100 mg via ORAL
  Filled 2019-12-12: qty 2

## 2019-12-12 MED ORDER — AMLODIPINE BESYLATE 5 MG PO TABS
10.0000 mg | ORAL_TABLET | Freq: Every evening | ORAL | Status: DC
  Administered 2019-12-12: 10 mg via ORAL
  Filled 2019-12-12: qty 2

## 2019-12-12 NOTE — Discharge Instructions (Signed)
Your work-up in the emergency department today has been reassuring.  We recommend follow-up with your primary care doctor as well as continued follow-up with your assigned specialists.  Continue your daily medications.  You may return for any new or concerning symptoms.

## 2019-12-12 NOTE — ED Provider Notes (Signed)
2:25 AM Patient care assumed at change of shift from tenderly, PA-C.  In short, patient is a 74 year old female with history of CKD, CVA, and chronic right knee pain with chronic osteomyelitis presenting after a fall this evening.  CT head and cervical spine pending at shift change.  Imaging has been reviewed and is stable compared to prior evaluations.  Patient noted to be hypertensive in the ED.  This is not far from her baseline.  Of note, she did not take her evening blood pressure medications today.  These have been ordered for her.  Patient has intact sensation in all extremities.  Sensation is equal with preserved, 5/5 grip strength in bilateral hands.  Normal 5/5 strength against resistance in bilateral upper extremities.  She has some discomfort with palpation to her lower occiput, but states that this is secondary to her fall.  She denies any neck pain or headache before striking her head.  She has no significant hematoma or contusion on exam.  No battle sign or raccoon's eyes.  Patient did report some lightheadedness preceding her mechanical fall today.  Her EKG has been reviewed and is unchanged in comparison to November 2019.  Vital signs have been stable over prolonged observation; afebrile.  She has not experienced any clinical decompensation.  Work-up does not reveal any UTI, leukocytosis, electrolyte derangements.  Patient felt appropriate for discharge and outpatient follow-up with her primary care doctor.  She states that she does not drive and her daughter is no longer in the area to bring her home.  Will discuss with nursing patient's transportation concerns to ensure her ability to be discharged safely.  Patient with no unaddressed concerns; expresses comfort with plan for PCP follow up.   Results for orders placed or performed during the hospital encounter of 12/11/19  CBC with Differential  Result Value Ref Range   WBC 8.1 4.0 - 10.5 K/uL   RBC 4.72 3.87 - 5.11 MIL/uL    Hemoglobin 12.0 12.0 - 15.0 g/dL   HCT 39.5 36.0 - 46.0 %   MCV 83.7 80.0 - 100.0 fL   MCH 25.4 (L) 26.0 - 34.0 pg   MCHC 30.4 30.0 - 36.0 g/dL   RDW 14.8 11.5 - 15.5 %   Platelets 361 150 - 400 K/uL   nRBC 0.0 0.0 - 0.2 %   Neutrophils Relative % 61 %   Neutro Abs 5.0 1.7 - 7.7 K/uL   Lymphocytes Relative 22 %   Lymphs Abs 1.8 0.7 - 4.0 K/uL   Monocytes Relative 9 %   Monocytes Absolute 0.7 0.1 - 1.0 K/uL   Eosinophils Relative 7 %   Eosinophils Absolute 0.6 (H) 0.0 - 0.5 K/uL   Basophils Relative 1 %   Basophils Absolute 0.0 0.0 - 0.1 K/uL   Immature Granulocytes 0 %   Abs Immature Granulocytes 0.03 0.00 - 0.07 K/uL  Comprehensive metabolic panel  Result Value Ref Range   Sodium 137 135 - 145 mmol/L   Potassium 5.1 3.5 - 5.1 mmol/L   Chloride 101 98 - 111 mmol/L   CO2 25 22 - 32 mmol/L   Glucose, Bld 119 (H) 70 - 99 mg/dL   BUN 30 (H) 8 - 23 mg/dL   Creatinine, Ser 0.93 0.44 - 1.00 mg/dL   Calcium 9.4 8.9 - 10.3 mg/dL   Total Protein 8.5 (H) 6.5 - 8.1 g/dL   Albumin 3.4 (L) 3.5 - 5.0 g/dL   AST 34 15 - 41 U/L   ALT  11 0 - 44 U/L   Alkaline Phosphatase 107 38 - 126 U/L   Total Bilirubin 1.6 (H) 0.3 - 1.2 mg/dL   GFR calc non Af Amer >60 >60 mL/min   GFR calc Af Amer >60 >60 mL/min   Anion gap 11 5 - 15  Urinalysis, Routine w reflex microscopic  Result Value Ref Range   Color, Urine YELLOW YELLOW   APPearance CLEAR CLEAR   Specific Gravity, Urine 1.014 1.005 - 1.030   pH 6.0 5.0 - 8.0   Glucose, UA NEGATIVE NEGATIVE mg/dL   Hgb urine dipstick NEGATIVE NEGATIVE   Bilirubin Urine NEGATIVE NEGATIVE   Ketones, ur NEGATIVE NEGATIVE mg/dL   Protein, ur 100 (A) NEGATIVE mg/dL   Nitrite NEGATIVE NEGATIVE   Leukocytes,Ua NEGATIVE NEGATIVE   RBC / HPF 0-5 0 - 5 RBC/hpf   WBC, UA 0-5 0 - 5 WBC/hpf   Bacteria, UA RARE (A) NONE SEEN   CT Head Wo Contrast  Result Date: 12/12/2019 CLINICAL DATA:  Head and neck pain after fall 12/08/2019 and 12/11/2019 EXAM: CT HEAD WITHOUT  CONTRAST TECHNIQUE: Contiguous axial images were obtained from the base of the skull through the vertex without intravenous contrast. COMPARISON:  Head CT 12/08/2019 FINDINGS: Brain: No intracranial hemorrhage, mass effect, or midline shift. Brain volume normal for age. No hydrocephalus. The basilar cisterns are patent. Unchanged chronic small vessel ischemia. Remote lacunar infarct in the right basal ganglia. No evidence of territorial infarct or acute ischemia. No extra-axial or intracranial fluid collection. Vascular: Atherosclerosis of skullbase vasculature without hyperdense vessel or abnormal calcification. Skull: No fracture or focal lesion. Sinuses/Orbits: Unchanged diffuse paranasal sinus opacification, with high density in the left maxillary sinus. No sinus fracture. Mastoid air cells are clear. No acute orbital abnormality. Other: None. IMPRESSION: 1. No acute intracranial abnormality. No skull fracture. 2. Unchanged chronic small vessel ischemia and remote lacunar infarct in the right basal ganglia. 3. Pansinusitis unchanged from exam 4 days ago. Electronically Signed   By: Keith Rake M.D.   On: 12/12/2019 01:07   CT Head Wo Contrast  Result Date: 12/08/2019 CLINICAL DATA:  Left-sided head pain after fall 3 days ago EXAM: CT HEAD WITHOUT CONTRAST TECHNIQUE: Contiguous axial images were obtained from the base of the skull through the vertex without intravenous contrast. COMPARISON:  02/28/2016 FINDINGS: Brain: No evidence of acute infarction, hemorrhage, hydrocephalus, extra-axial collection or mass lesion/mass effect. Chronic bilateral basal ganglia lacunar infarcts. Scattered low-density changes within the periventricular and subcortical white matter compatible with chronic microvascular ischemic change. Mild diffuse cerebral volume loss. Vascular: Mild atherosclerotic calcifications involving the large vessels of the skull base. No unexpected hyperdense vessel. Skull: Normal. Negative for  fracture or focal lesion. Sinuses/Orbits: Dense opacification of the bilateral maxillary, ethmoid, and frontal sinuses. Mastoid air cells are clear. Orbital structures grossly unremarkable. Other: None. IMPRESSION: 1. No acute intracranial abnormality. 2. Chronic microvascular ischemic changes and cerebral volume loss. 3. Chronic bilateral basal ganglia lacunar infarcts. 4. Pansinusitis. Electronically Signed   By: Davina Poke D.O.   On: 12/08/2019 14:06   CT Cervical Spine Wo Contrast  Result Date: 12/12/2019 CLINICAL DATA:  Left head and neck pain after fall 12/08/2019 and 12/11/2019. EXAM: CT CERVICAL SPINE WITHOUT CONTRAST TECHNIQUE: Multidetector CT imaging of the cervical spine was performed without intravenous contrast. Multiplanar CT image reconstructions were also generated. COMPARISON:  Cervical spine CT 12/08/2019 FINDINGS: Alignment: Straightening of normal lordosis. No traumatic subluxation. Skull base and vertebrae: No acute fracture. Vertebral body  heights are maintained. The dens and skull base are intact. Soft tissues and spinal canal: No prevertebral fluid or swelling. No visible canal hematoma. Disc levels: Advanced degenerative disc disease with disc space narrowing, endplate spurring, endplate sclerosis from C3-C4 through C6-C7. Scattered facet hypertrophy. Upper chest: No acute findings. Other: None. IMPRESSION: 1. No acute fracture or change from prior exam. 2. Stable straightening of normal lordosis and diffuse degenerative disc disease. Electronically Signed   By: Keith Rake M.D.   On: 12/12/2019 01:11   CT Cervical Spine Wo Contrast  Result Date: 12/08/2019 CLINICAL DATA:  Left-sided neck pain after fall 3 days ago EXAM: CT CERVICAL SPINE WITHOUT CONTRAST TECHNIQUE: Multidetector CT imaging of the cervical spine was performed without intravenous contrast. Multiplanar CT image reconstructions were also generated. COMPARISON:  02/29/2016 FINDINGS: Alignment: Cervical spine  alignment is maintained without traumatic listhesis. Facet joints aligned. Dens and lateral masses aligned. Straightening of the cervical lordosis, which may be positional or secondary to muscular strain. Skull base and vertebrae: No acute fracture. No primary bone lesion or focal pathologic process. Soft tissues and spinal canal: No prevertebral fluid or swelling. No visible canal hematoma. Disc levels: Intervertebral disc height loss with degenerative endplate changes throughout the cervical spine. Relatively mild multilevel facet arthrosis. No evidence of high-grade canal stenosis by CT. Upper chest: Lung apices clear. Other: None. IMPRESSION: 1. No acute fracture or malalignment of the cervical spine. 2. Straightening of the cervical lordosis, which may be positional or secondary to muscular strain. 3. Multilevel degenerative disc disease and facet arthrosis. Electronically Signed   By: Davina Poke D.O.   On: 12/08/2019 14:11      Antonietta Breach, PA-C 12/12/19 0230    Ward, Delice Bison, DO 12/12/19 [phone]

## 2019-12-19 ENCOUNTER — Ambulatory Visit: Payer: Medicare Other | Admitting: Infectious Disease

## 2019-12-20 ENCOUNTER — Telehealth: Payer: Self-pay | Admitting: *Deleted

## 2019-12-20 NOTE — Telephone Encounter (Signed)
Patient requesting call back to reschedule her MRI and follow up with Dr Tommy Medal. Will send to [facility] for assistance. Landis Gandy, RN

## 2019-12-26 ENCOUNTER — Other Ambulatory Visit: Payer: Self-pay | Admitting: Family Medicine

## 2019-12-27 ENCOUNTER — Other Ambulatory Visit: Payer: Self-pay

## 2019-12-27 ENCOUNTER — Ambulatory Visit (HOSPITAL_COMMUNITY)
Admission: RE | Admit: 2019-12-27 | Discharge: 2019-12-27 | Disposition: A | Discharge status: Home / Self Care | Payer: Medicare HMO | Source: Ambulatory Visit | Attending: Infectious Disease | Admitting: Infectious Disease

## 2019-12-27 DIAGNOSIS — Z96651 Presence of right artificial knee joint: Secondary | ICD-10-CM | POA: Diagnosis not present

## 2019-12-27 DIAGNOSIS — T8453XD Infection and inflammatory reaction due to internal right knee prosthesis, subsequent encounter: Secondary | ICD-10-CM | POA: Insufficient documentation

## 2019-12-27 DIAGNOSIS — Z471 Aftercare following joint replacement surgery: Secondary | ICD-10-CM | POA: Diagnosis not present

## 2019-12-27 MED ORDER — GADOBUTROL 1 MMOL/ML IV SOLN
10.0000 mL | Freq: Once | INTRAVENOUS | Status: AC | PRN
  Administered 2019-12-27: 10 mL via INTRAVENOUS

## 2019-12-30 ENCOUNTER — Telehealth: Payer: Self-pay

## 2019-12-30 NOTE — Telephone Encounter (Signed)
-----   Message from Truman Hayward, MD sent at 12/28/2019  9:59 AM EST ----- Can we make sure Dr. Maureen Ralphs takes a look at this scan. She is NOT on antibiotics at present. Inflammatory markers up still but relatively stable

## 2019-12-30 NOTE — Telephone Encounter (Signed)
Spoke with Marlowe Kays at [employer] who will send message to Dr. Maureen Ralphs. Also provided Dr. Lucianne Lei Dam's pager number(out until 01/02/20) if Dr. Maureen Ralphs wanted to discuss matter. Same note documented in result note and routed to Dr. Maureen Ralphs and Dr. Tommy Medal [facility]

## 2019-12-30 NOTE — Telephone Encounter (Signed)
COVID-19 Pre-Screening Questions:12/30/19  Do you currently have a fever (>100 F), chills or unexplained body aches?NO  Are you currently experiencing new cough, shortness of breath, sore throat, runny nose?NO .  Have you recently travelled outside the state of [place] in the last 14 days? NO .  Have you been in contact with someone that is currently pending confirmation of Covid19 testing or has been confirmed to have the [place] virus? NO   **If the patient answers NO to ALL questions -  advise the patient to please call the clinic before coming to the office should any symptoms develop.

## 2020-01-02 ENCOUNTER — Other Ambulatory Visit: Payer: Self-pay

## 2020-01-02 ENCOUNTER — Encounter: Payer: Self-pay | Admitting: Infectious Disease

## 2020-01-02 ENCOUNTER — Ambulatory Visit: Payer: Medicare HMO | Admitting: Infectious Disease

## 2020-01-02 VITALS — BP 171/91 | HR 68

## 2020-01-02 DIAGNOSIS — R1031 Right lower quadrant pain: Secondary | ICD-10-CM

## 2020-01-02 DIAGNOSIS — M76891 Other specified enthesopathies of right lower limb, excluding foot: Secondary | ICD-10-CM

## 2020-01-02 DIAGNOSIS — A498 Other bacterial infections of unspecified site: Secondary | ICD-10-CM | POA: Diagnosis not present

## 2020-01-02 DIAGNOSIS — T8453XD Infection and inflammatory reaction due to internal right knee prosthesis, subsequent encounter: Secondary | ICD-10-CM | POA: Diagnosis not present

## 2020-01-02 DIAGNOSIS — M47818 Spondylosis without myelopathy or radiculopathy, sacral and sacrococcygeal region: Secondary | ICD-10-CM | POA: Diagnosis not present

## 2020-01-02 DIAGNOSIS — M1A9XX0 Chronic gout, unspecified, without tophus (tophi): Secondary | ICD-10-CM | POA: Diagnosis not present

## 2020-01-02 DIAGNOSIS — M461 Sacroiliitis, not elsewhere classified: Secondary | ICD-10-CM

## 2020-01-02 NOTE — Progress Notes (Signed)
Subjective:  Chief complaint: Follow-up for right prosthetic knee infection still with some pain when she bears weight in particular   l Patient ID: Beth [name], female    DOB: 06-Dec-1946, 74 y.o.   MRN: [id number]  HPI  Beth [name] is a 74  y.o. 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  She apparently originally had right knee total arthroplasty in July 2015.  In the interim she had an I&D in October 2015 with poly-exchange followed by antibiotics.  She then had in 2016 a right knee resection arthroplasty with placement of antibiotic spacers followed by antibiotics and followed by reimplantation of new prosthetic knee.  Unfortunately she is again had failure and of the attempts to eradicate infection in his knee and her prosthetic knee has been removed and antibiotic spacer placed  Is my understanding that no organism had been isolated in the past.   She subsequently grew a pansensitive pseudomonal species.  She was discharged on cefepime but then admitted with renal failure and changed to oral ciprofloxacin she was having trouble with her PICC line as well.  Renal function had improved during her hospitalization and she just continued on ciprofloxacin in the skilled nursing facility.  Knee pain is better now versus when she was in the hospital.  She was due to complete the ciprofloxacin on 10 January thousand 20.  In the interim she was seen by me and we rechecked inflammatory markers which were still quite high sed rate still in the 70s and CRP had gone up into the 20s.  Therefore I had continued her on oral ciprofloxacin in the nursing facility.  When she was discharged some skilled nurse facility on 20 January they unfortunately did not send her home with oral antibiotics.  She then resumed the oral l ciprofloxacin.  However in the interim she has developed some severe right-sided groin pain   We obtained an MRI of the hip which did  not show osteomyelitis, or septic hep but did show severe osteoarthritic changes of the sacroiliac joints bilaterally and also question of some hamstring tendinosis.  Beth [name] saw the patient in clinic reviewed films and had some concern whether or not the tendinosis seen on imaging could be related to chronic fluoroquinolone usage.  The patient's pain in her knee had improved as mentioned inflammatory markers had trended down.  Patient was taken off ciprofloxacin to see how she would do both in terms of her groin and hip pain and also her knee pain.  Since then her hip pain is improved dramatically suggesting the possibility that fluoroquinolones could have been in play in terms of causing a tendinopathy.   Her knee pain has remained stable off of antibiotics  Since August of 2020.  When I saw her last I was encouraged by how she was doing but when I checked her inflammatory markers they had become more elevated.  She has been seen by Dr. Maureen Ralphs and recounts him removing fluid from her knee and sending this for analysis and culture.  However when I went into "care everywhere portal the notes indicated that fluid cannot be withdrawn.  We will see if there was a second time that fluid might have been taken off the knee.  Dr. Maureen Ralphs also mentioned doing an MRI of the knee prior to considering reimplanting a new knee.    Did obtain MRI of the knee on December 28, 2019 and this was  read as showing:   Antibiotic laden cement with some postsurgical changes in the distal femur and proximal tibia there was some irregular enhancement on the margins of the tracks that were felt by radiology possibly concerning for osteomyelitis versus residual postsurgical changes.  I have also personally reviewed these films  I alerted Dr. Maureen Ralphs re this MRI and he also reviewed himself.  He felt the changes were more consistent with abrasive changes that can happen in the context of chronic antibiotic cement  being in place he was less concerned by the radiographic appearance for possible osteomyelitis.  We both continue to be concerned about her elevated inflammatory markers.  She does have a history of gout though it has not flared all in recent times she is really not on any medicines for gout at present.     Past Medical History:  Diagnosis Date  . Anemia   . Arthritis    Knee both knees  . Blood transfusion without reported diagnosis 2012   anemia;pt denies transfusion stated was only on iron tablet  . Cataract    left  . CKD (chronic kidney disease), stage III   . CVA (cerebral infarction)    2006  . Diabetes mellitus without complication ([place])   . Family history of anesthesia complication    sister very slow to awaken after anesthesia;severe vomiting   . Gout    left elbow  . Groin pain 06/21/2019  . Hamstring tendinitis 08/22/2019  . Heart murmur   . 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 08-09-14 Right eye  . History of gout 01/04/2019  . Hyperlipidemia   . Hypertension   . Nocturia    3-4 times per night  . 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 Stroke [facility]) 2006   x 1 no deficits noted     Past Surgical History:  Procedure Laterality Date  . ABDOMINAL HYSTERECTOMY  1983  . CARPAL TUNNEL RELEASE Right 1983  . colonscopy  June 21, 2014  . EXCISIONAL TOTAL KNEE ARTHROPLASTY WITH ANTIBIOTIC SPACERS Right 02/16/2015   Procedure: RIGHT KNEE RESECTION ARTHROPLASTY WITH ANTIBIOTIC SPACERS;  Surgeon: [name], MD;  Location: [facility];  Service: Orthopedics;  Laterality: Right;  . EXCISIONAL TOTAL KNEE ARTHROPLASTY WITH ANTIBIOTIC SPACERS Right 11/03/2018   Procedure: Right knee resection arthroplasty; antibiotic spacer;  Surgeon: [name], MD;  Location: [facility];  Service: Orthopedics;  Laterality: Right;  Adductor Block  . I & D KNEE WITH POLY EXCHANGE Right 10/02/2014   Procedure: IRRIGATION AND  DEBRIDEMENT RIGHT KNEE WITH POLY EXCHANGE;  Surgeon: Gearlean Alf, MD;  Location: [facility];  Service: Orthopedics;  Laterality: Right;  . INCISION AND DRAINAGE OF WOUND Right 01/14/2017   Procedure: IRRIGATION AND DEBRIDEMENT WOUND;  Surgeon: [name], MD;  Location: [facility];  Service: Orthopedics;  Laterality: Right;  requests 26mins  . JOINT REPLACEMENT  06/2014   right knee  . nasal cauterization  2012  . PATELLAR TENDON REPAIR Right 08/11/2014   Procedure: RIGHT PATELLA TENDON REPAIR;  Surgeon: Gearlean Alf, MD;  Location: [facility];  Service: Orthopedics;  Laterality: Right;  . REIMPLANTATION OF TOTAL KNEE Right 05/23/2015   Procedure: RIGHT KNEE ARTHROPLASTY REIMPLANTATION;  Surgeon: [name], MD;  Location: [facility];  Service: Orthopedics;  Laterality: Right;  . TOTAL KNEE ARTHROPLASTY Right 07/03/2014   Procedure: RIGHT TOTAL KNEE ARTHROPLASTY;  Surgeon: Gearlean Alf, MD;  Location: [facility];  Service:  Orthopedics;  Laterality: Right;  . TUBAL LIGATION      Family History  Problem Relation Age of Onset  . Ovarian cancer Mother   . Cancer Mother   . Diabetes Son   . Diabetes Son   . Peripheral vascular disease Father        with amputation of both legs  . Hypertension Father   . Heart disease Brother   . Kidney disease Daughter   . Colon cancer Neg Hx   . Esophageal cancer Neg Hx   . Stomach cancer Neg Hx   . Rectal cancer Neg Hx       Social History   Socioeconomic History  . Marital status: Single    Spouse name: Not on file  . Number of children: 7  . Years of education: 8th  . Highest education level: 8th grade  Occupational History    Employer: RETIRED  Tobacco Use  . Smoking status: Never Smoker  . Smokeless tobacco: Never Used  Substance and Sexual Activity  . Alcohol use: No  . Drug use: No  . Sexual activity: Never  Other Topics Concern  . Not on file  Social History Narrative   Patient is widowed and lives with her granddaughter. She has 7  adult children and many grandchildren and great grandchildren. Patient is retired. 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    Social Determinants of Health   Financial Resource Strain:   . Difficulty of Paying Living Expenses: Not on file  Food Insecurity:   . Worried About Charity fundraiser in the Last Year: Not on file  . Ran Out of Food in the Last Year: Not on file  Transportation Needs:   . Lack of Transportation (Medical): Not on file  . Lack of Transportation (Non-Medical): Not on file  Physical Activity:   . Days of Exercise per Week: Not on file  . Minutes of Exercise per Session: Not on file  Stress:   . Feeling of Stress : Not on file  Social Connections:   . Frequency of Communication with Friends and Family: Not on file  . Frequency of Social Gatherings with Friends and Family: Not on file  . Attends Religious Services: Not on file  . Active Member of Clubs or Organizations: Not on file  . Attends Archivist Meetings: Not on file  . Marital Status: Not on file    Allergies  Allergen Reactions  . Aleve [Naproxen Sodium] Other (See Comments)    Heart races  . Asa [Aspirin] Other (See Comments)    Nose bleeding  . Ciprofloxacin Other (See Comments)    Possible hamstring tendinopathy  . Clonidine Derivatives Other (See Comments)    Dizziness and weakness  . Shellfish Allergy Nausea And Vomiting     Current Outpatient Medications:  .  acetaminophen (TYLENOL) 500 MG tablet, Take 2 tablets (1,000 mg total) by mouth every 6 (six) hours as needed for mild pain., Disp: 90 tablet, Rfl: 1 .  amLODipine (NORVASC) 10 MG tablet, TAKE 1 TABLET DAILY IN THE EVENING (Patient taking differently: Take 10 mg by mouth every evening. ), Disp: 90 tablet, Rfl: 1 .  atorvastatin (LIPITOR) 20 MG tablet, Take 1 tablet (20 mg total) by mouth every evening., Disp: 90 tablet, Rfl: 1 .  cholecalciferol (VITAMIN D3) 25 MCG (1000 UT) tablet, Take 1,000 Units by  mouth daily., Disp: , Rfl:  .  Cod Liver Oil CAPS, Take 1 capsule  by mouth daily., Disp: , Rfl:  .  diltiazem (CARDIZEM CD) 240 MG 24 hr capsule, TAKE ONE (1) CAPSULE EACH DAY, Disp: 90 capsule, Rfl: 1 .  ferrous sulfate 325 (65 FE) MG tablet, Take 325 mg by mouth daily., Disp: , Rfl:  .  furosemide (LASIX) 20 MG tablet, Take 1 tablet (20 mg total) by mouth daily., Disp: 30 tablet, Rfl: 5 .  hydrALAZINE (APRESOLINE) 100 MG tablet, TAKE ONE (1) TABLET THREE (3) TIMES EACH DAY (Patient taking differently: Take 100 mg by mouth 3 (three) times daily. ), Disp: 270 tablet, Rfl: 1 .  HYDROcodone-acetaminophen (NORCO/VICODIN) 5-325 MG tablet, Take 1 tablet by mouth every 6 (six) hours as needed. (Patient taking differently: Take 1 tablet by mouth every 6 (six) hours as needed for moderate pain or severe pain. ), Disp: 14 tablet, Rfl: 0 .  isosorbide dinitrate (ISORDIL) 10 MG tablet, TAKE ONE (1) TABLET THREE (3) TIMES EACH DAY (Patient taking differently: Take 10 mg by mouth 3 (three) times daily. ), Disp: 90 tablet, Rfl: 0 .  metFORMIN (GLUCOPHAGE-XR) 500 MG 24 hr tablet, TAKE ONE (1) TABLET EACH DAY, Disp: 90 tablet, Rfl: 0 .  Nebivolol HCl (BYSTOLIC) 20 MG TABS, TAKE ONE (1) TABLET EACH DAY (Patient taking differently: Take 20 mg by mouth daily. ), Disp: 90 each, Rfl: 1 .  olmesartan (BENICAR) 40 MG tablet, Take 1 tablet (40 mg total) by mouth daily. (Needs to be seen before next refill), Disp: 30 tablet, Rfl: 0 .  ONE TOUCH ULTRA TEST test strip, Use to check glucose up to four times daily, Disp: 400 each, Rfl: 1 .  ONETOUCH DELICA LANCETS 99991111 MISC, USE TO CHECK GLUCOSE UP TO  4 TIMES DAILY, Disp: 400 each, Rfl: 0 .  oxyCODONE (OXY IR/ROXICODONE) 5 MG immediate release tablet, Take 1 tablet (5 mg total) by mouth every 6 (six) hours as needed for moderate pain (pain score 4-6). (Patient not taking: Reported on 12/11/2019), Disp: 5 tablet, Rfl: 0 .  TRADJENTA 5 MG TABS tablet, TAKE ONE (1) TABLET EACH DAY  (Patient taking differently: Take 5 mg by mouth daily. ), Disp: 30 tablet, Rfl: 5 No current facility-administered medications for this visit.  Facility-Administered Medications Ordered in Other Visits:  .  tranexamic acid (CYKLOKAPRON) 2,000 mg in sodium chloride 0.9 % 50 mL Topical Application, 123XX123 mg, Topical, Once, Aluisio, Frank, MD  Review of Systems  Constitutional: Negative for activity change, appetite change, chills, diaphoresis, fatigue, fever and unexpected weight change.  HENT: Negative for congestion, rhinorrhea, sinus pressure, sneezing, sore throat and trouble swallowing.   Eyes: Negative for photophobia and visual disturbance.  Respiratory: Negative for cough, chest tightness, shortness of breath, wheezing and stridor.   Cardiovascular: Negative for chest pain, palpitations and leg swelling.  Gastrointestinal: Negative for abdominal distention, abdominal pain, anal bleeding, blood in stool, constipation, diarrhea, nausea and vomiting.  Genitourinary: Negative for difficulty urinating, dysuria, flank pain and hematuria.  Musculoskeletal: Positive for arthralgias. Negative for back pain, joint swelling and myalgias.  Skin: Negative for color change, pallor and rash.  Neurological: Negative for dizziness, tremors, weakness and light-headedness.  Hematological: Negative for adenopathy. Does not bruise/bleed easily.  Psychiatric/Behavioral: Negative for agitation, behavioral problems, confusion, decreased concentration, dysphoric mood and sleep disturbance. The patient is not nervous/anxious.        Objective:   Physical Exam Constitutional:      General: She is not in acute distress.    Appearance: She is not diaphoretic.  HENT:     Head: Normocephalic and atraumatic.     Right Ear: External ear normal.     Left Ear: External ear normal.     Nose: Nose normal.  Eyes:     General: No scleral icterus.    Extraocular Movements: Extraocular movements intact.      Conjunctiva/sclera: Conjunctivae normal.  Cardiovascular:     Rate and Rhythm: Normal rate and regular rhythm.  Pulmonary:     Effort: Pulmonary effort is normal. No respiratory distress.  Abdominal:     General: There is no distension.  Musculoskeletal:        General: No tenderness.     Cervical back: Normal range of motion and neck supple.  Lymphadenopathy:     Cervical: No cervical adenopathy.  Skin:    General: Skin is warm and dry.     Coloration: Skin is not pale.     Findings: No erythema or rash.  Neurological:     General: No focal deficit present.     Mental Status: She is alert and oriented to person, place, and time.     Coordination: Coordination normal.  Psychiatric:        Attention and Perception: Attention normal.        Mood and Affect: Mood normal. Mood is not anxious.        Behavior: Behavior normal.        Thought Content: Thought content normal.        Judgment: Judgment normal.      Assessment & Plan:    Right prosthetic joint infection status post multiple surgeries now status post explantation of prosthesis implantation of antibiotics beads and spacer along with 6 weeks of systemic antibiotics followed by months of ciprofloxacin  We will check inflammatory markers again continue to observe her off antibiotics for now.   Also check a uric acid level and if her uric acid is low elevated would start her on some colchicine to start with.  I will plan on seeing her back in roughly 3 months time.  I have discussed case with Dr Maureen Ralphs and IF she clinically continues to appear well it is not unreasonable to attempt a final attempt at re-implantation with her operative cultures taken at the time of surgery.  Certainly should she still have a pseudomonal infection her care will be very complicated because we will not be able to follow systemic IV antibiotics with pills given her possible tendinopathy that developed on fluoroquinolones.  Certainly there  is no urgency to surgery right now especially in the context of the COVID-19 pandemic  Right groin pain: Has resolved off of fluoroquinolone raising question of tendinopathy could be also related to sacroiliac disease.   COVID-19 prevention she has had her first COVID-19 vaccine and getting her second soon

## 2020-01-03 LAB — BASIC METABOLIC PANEL WITH GFR
BUN: 25 mg/dL (ref 7–25)
CO2: 25 mmol/L (ref 20–32)
Calcium: 9.3 mg/dL (ref 8.6–10.4)
Chloride: 104 mmol/L (ref 98–110)
Creat: 0.93 mg/dL (ref 0.60–0.93)
GFR, Est African American: 69 mL/min/{1.73_m2} (ref >=60)
GFR, Est Non African American: 60 mL/min/{1.73_m2} (ref >=60)
Glucose, Bld: 92 mg/dL (ref 65–99)
Potassium: 4 mmol/L (ref 3.5–5.3)
Sodium: 138 mmol/L (ref 135–146)

## 2020-01-03 LAB — URIC ACID: Uric Acid, Serum: 6.4 mg/dL (ref 2.5–7.0)

## 2020-01-03 LAB — C-REACTIVE PROTEIN: CRP: 4 mg/L (ref <8.0)

## 2020-01-03 LAB — SEDIMENTATION RATE: Sed Rate: 67 mm/h — ABNORMAL HIGH (ref 0–30)

## 2020-01-07 ENCOUNTER — Other Ambulatory Visit: Payer: Self-pay | Admitting: Family Medicine

## 2020-01-12 ENCOUNTER — Ambulatory Visit: Payer: Medicare HMO | Attending: Internal Medicine

## 2020-01-12 ENCOUNTER — Other Ambulatory Visit: Payer: Self-pay

## 2020-01-12 DIAGNOSIS — Z23 Encounter for immunization: Secondary | ICD-10-CM | POA: Insufficient documentation

## 2020-01-12 NOTE — Progress Notes (Signed)
   Covid-19 Vaccination Clinic  Name:  Beth [name]    MRN: [id number] DOB: [birth year]  01/12/2020  [name] was observed post Covid-19 immunization for 15 minutes without incidence. She was provided with Vaccine Information Sheet and instruction to access the V-Safe system.   [name] was instructed to call 911 with any severe reactions post vaccine: Marland Kitchen Difficulty breathing  . Swelling of your face and throat  . A fast heartbeat  . A bad rash all over your body  . Dizziness and weakness    Immunizations Administered    Name Date Dose VIS Date Route   Moderna COVID-19 Vaccine 01/12/2020 10:34 AM 0.5 mL 11/08/2019 Intramuscular   Manufacturer: Moderna   Lot: [id number]   [id number]

## 2020-01-19 DIAGNOSIS — M79676 Pain in unspecified toe(s): Secondary | ICD-10-CM | POA: Diagnosis not present

## 2020-01-19 DIAGNOSIS — L84 Corns and callosities: Secondary | ICD-10-CM | POA: Diagnosis not present

## 2020-01-19 DIAGNOSIS — E1142 Type 2 diabetes mellitus with diabetic polyneuropathy: Secondary | ICD-10-CM | POA: Diagnosis not present

## 2020-01-19 DIAGNOSIS — B351 Tinea unguium: Secondary | ICD-10-CM | POA: Diagnosis not present

## 2020-01-24 ENCOUNTER — Other Ambulatory Visit: Payer: Self-pay | Admitting: Family Medicine

## 2020-02-04 ENCOUNTER — Other Ambulatory Visit: Payer: Self-pay | Admitting: Family Medicine

## 2020-02-09 ENCOUNTER — Ambulatory Visit: Payer: Medicare HMO

## 2020-02-10 ENCOUNTER — Other Ambulatory Visit: Payer: Self-pay | Admitting: Family Medicine

## 2020-02-13 ENCOUNTER — Ambulatory Visit: Payer: Medicare HMO | Attending: Internal Medicine

## 2020-02-13 DIAGNOSIS — Z23 Encounter for immunization: Secondary | ICD-10-CM | POA: Insufficient documentation

## 2020-03-03 ENCOUNTER — Other Ambulatory Visit: Payer: Self-pay | Admitting: Family Medicine

## 2020-03-05 NOTE — Telephone Encounter (Signed)
Needs to be seen before further refills

## 2020-03-17 ENCOUNTER — Other Ambulatory Visit: Payer: Self-pay | Admitting: Family Medicine

## 2020-03-19 ENCOUNTER — Other Ambulatory Visit: Payer: Self-pay | Admitting: *Deleted

## 2020-03-19 MED ORDER — OLMESARTAN MEDOXOMIL 40 MG PO TABS: ORAL_TABLET | ORAL | 0 refills | Status: DC

## 2020-03-19 MED ORDER — DILTIAZEM HCL ER COATED BEADS 240 MG PO CP24: ORAL_CAPSULE | ORAL | 0 refills | Status: DC

## 2020-03-19 MED ORDER — LINAGLIPTIN 5 MG PO TABS: 5.0000 mg | ORAL_TABLET | Freq: Every day | ORAL | 0 refills | Status: DC

## 2020-03-19 MED ORDER — AMLODIPINE BESYLATE 10 MG PO TABS: 10.0000 mg | ORAL_TABLET | Freq: Every evening | ORAL | 0 refills | Status: DC

## 2020-03-19 MED ORDER — HYDRALAZINE HCL 100 MG PO TABS: ORAL_TABLET | ORAL | 0 refills | Status: DC

## 2020-03-19 MED ORDER — ATORVASTATIN CALCIUM 20 MG PO TABS: 20.0000 mg | ORAL_TABLET | Freq: Every evening | ORAL | 0 refills | Status: DC

## 2020-03-19 MED ORDER — BD SWAB SINGLE USE REGULAR PADS: MEDICATED_PAD | 3 refills | Status: DC

## 2020-03-19 MED ORDER — ISOSORBIDE DINITRATE 10 MG PO TABS: ORAL_TABLET | ORAL | 0 refills | Status: DC

## 2020-03-19 MED ORDER — BYSTOLIC 20 MG PO TABS: ORAL_TABLET | ORAL | 0 refills | Status: DC

## 2020-03-19 MED ORDER — METFORMIN HCL ER 500 MG PO TB24: ORAL_TABLET | ORAL | 0 refills | Status: DC

## 2020-03-19 MED ORDER — TRUE METRIX BLOOD GLUCOSE TEST VI STRP: ORAL_STRIP | 3 refills | Status: DC

## 2020-03-19 MED ORDER — TRUE METRIX LEVEL 1 LOW VI SOLN: 0 refills | Status: DC

## 2020-03-19 MED ORDER — TRUE METRIX AIR GLUCOSE METER W/DEVICE KIT: 1.0000 | PACK | Freq: Four times a day (QID) | 0 refills | Status: DC

## 2020-03-26 ENCOUNTER — Ambulatory Visit: Payer: Medicare HMO | Admitting: Infectious Disease

## 2020-03-27 ENCOUNTER — Other Ambulatory Visit: Payer: Self-pay | Admitting: *Deleted

## 2020-03-27 ENCOUNTER — Other Ambulatory Visit: Payer: Self-pay | Admitting: Family Medicine

## 2020-03-27 MED ORDER — TRUEPLUS LANCETS 33G MISC: 3 refills | Status: DC

## 2020-03-29 ENCOUNTER — Ambulatory Visit (INDEPENDENT_AMBULATORY_CARE_PROVIDER_SITE_OTHER): Payer: Medicare HMO | Admitting: Family Medicine

## 2020-03-29 ENCOUNTER — Other Ambulatory Visit: Payer: Self-pay

## 2020-03-29 ENCOUNTER — Encounter: Payer: Self-pay | Admitting: Family Medicine

## 2020-03-29 VITALS — BP 141/76 | HR 75 | Temp 97.6°F | Resp 20

## 2020-03-29 DIAGNOSIS — E782 Mixed hyperlipidemia: Secondary | ICD-10-CM

## 2020-03-29 DIAGNOSIS — E119 Type 2 diabetes mellitus without complications: Secondary | ICD-10-CM

## 2020-03-29 DIAGNOSIS — I10 Essential (primary) hypertension: Secondary | ICD-10-CM | POA: Diagnosis not present

## 2020-03-29 LAB — BAYER DCA HB A1C WAIVED: HB A1C (BAYER DCA - WAIVED): 5.6 % (ref <7.0)

## 2020-03-29 MED ORDER — FEXOFENADINE HCL 180 MG PO TABS: 180.0000 mg | ORAL_TABLET | Freq: Every day | ORAL | 11 refills | Status: DC

## 2020-03-29 NOTE — Progress Notes (Signed)
Subjective:  Patient ID: Beth [name],  female    DOB: 1946-10-02  Age: 74 y.o.    CC: Medical Management of Chronic Issues   HPI MAYERLI KIRST presents for  follow-up of hypertension. Patient has no history of headache chest pain or shortness of breath or recent cough. Patient also denies symptoms of TIA such as numbness weakness lateralizing. Patient denies side effects from medication. States taking it regularly.  Patient also  in for follow-up of elevated cholesterol. Doing well without complaints on current medication. Denies side effects  including myalgia and arthralgia and nausea. Also in today for liver function testing. Currently no chest pain, shortness of breath or other cardiovascular related symptoms noted.  Follow-up of diabetes. Patient does not check blood sugar at home.  Patient denies symptoms such as excessive hunger or urinary frequency, excessive hunger, nausea No significant hypoglycemic spells noted. Medications reviewed. Pt reports taking them regularly. Pt. denies complication/adverse reaction today.    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   She has a past surgical history that includes Carpal tunnel release (Right, 1983); colonscopy (June 21, 2014); nasal cauterization (2012); Total knee arthroplasty (Right, 07/03/2014); Patellar tendon repair (Right, 08/11/2014); Joint replacement (06/2014); I & D knee with poly exchange (Right,  10/02/2014); Excisional total knee arthroplasty with antibiotic spacers (Right, 02/16/2015); Reimplantation of total knee (Right, 05/23/2015); Abdominal hysterectomy (1983); Tubal ligation; Incision and drainage of wound (Right, 01/14/2017); and Excisional total knee arthroplasty with antibiotic spacers (Right, 11/03/2018).   Her family history includes Cancer in her mother; Diabetes in her son and son; Heart disease in her brother; Hypertension in her father; Kidney disease in her daughter; Ovarian cancer in her mother; Peripheral vascular disease in her father.She reports that she has never smoked. She has never used smokeless tobacco. She reports that she does not drink alcohol or use drugs.  Current Outpatient Medications on File Prior to Visit  Medication Sig Dispense Refill  . acetaminophen (TYLENOL) 500 MG tablet Take 2 tablets (1,000 mg total) by mouth every 6 (six) hours as needed for mild pain. 90 tablet 1  . Alcohol Swabs (B-D SINGLE USE SWABS REGULAR) PADS Use to check glucose up to four times daily Dx E11.9 400 each 3  . amLODipine (NORVASC) 10 MG tablet Take 1 tablet (10 mg total) by mouth every evening. 90 tablet 0  . atorvastatin (LIPITOR) 20 MG tablet Take 1 tablet (20 mg total) by mouth every evening. 90 tablet 0  . Blood Glucose Calibration (TRUE METRIX LEVEL 1) Low SOLN Use with glucose monitor Dx E11.9 3 each 0  . Blood Glucose Monitoring Suppl (TRUE METRIX AIR GLUCOSE METER) w/Device KIT 1 Device by Does not apply route 4 (four) times daily. Dx E11.9 1 kit 0  . Cod Liver Oil CAPS Take 1 capsule by mouth daily.    Marland Kitchen diltiazem (CARDIZEM CD) 240 MG 24 hr capsule TAKE ONE (1) CAPSULE EACH DAY 90 capsule 0  . glucose blood (TRUE METRIX BLOOD GLUCOSE TEST) test strip Use to check glucose up to four times daily 400 each 3  . hydrALAZINE (APRESOLINE) 100 MG  tablet TAKE ONE (1) TABLET THREE (3) TIMES EACH DAY 270 tablet 0  . HYDROcodone-acetaminophen (NORCO/VICODIN) 5-325 MG tablet Take 1  tablet by mouth every 6 (six) hours as needed. (Patient taking differently: Take 1 tablet by mouth every 6 (six) hours as needed for moderate pain or severe pain. ) 14 tablet 0  . isosorbide dinitrate (ISORDIL) 10 MG tablet TAKE ONE (1) TABLET THREE (3) TIMES EACH DAY 90 tablet 0  . metFORMIN (GLUCOPHAGE-XR) 500 MG 24 hr tablet TAKE ONE (1) TABLET EACH DAY 90 tablet 0  . Nebivolol HCl (BYSTOLIC) 20 MG TABS TAKE ONE (1) TABLET EACH DAY 90 tablet 0  . olmesartan (BENICAR) 40 MG tablet TAKE ONE (1) TABLET EACH DAY 90 tablet 0  . TRADJENTA 5 MG TABS tablet TAKE ONE (1) TABLET EACH DAY 30 tablet 0  . TRUEplus Lancets 33G MISC Test QID Dx E11.9 400 each 3  . cholecalciferol (VITAMIN D3) 25 MCG (1000 UT) tablet Take 1,000 Units by mouth daily.    . ferrous sulfate 325 (65 FE) MG tablet Take 325 mg by mouth daily.    . furosemide (LASIX) 20 MG tablet Take 1 tablet (20 mg total) by mouth daily. (Patient not taking: Reported on 03/29/2020) 30 tablet 5  . linagliptin (TRADJENTA) 5 MG TABS tablet Take 1 tablet (5 mg total) by mouth daily. (Patient not taking: Reported on 03/29/2020) 90 tablet 0  . oxyCODONE (OXY IR/ROXICODONE) 5 MG immediate release tablet Take 1 tablet (5 mg total) by mouth every 6 (six) hours as needed for moderate pain (pain score 4-6). (Patient not taking: Reported on 03/29/2020) 5 tablet 0   Current Facility-Administered Medications on File Prior to Visit  Medication Dose Route Frequency Provider Last Rate Last Admin  . tranexamic acid (CYKLOKAPRON) 2,000 mg in sodium chloride 0.9 % 50 mL Topical Application  5,631 mg Topical Once [name], MD        ROS Review of Systems  Constitutional: Negative.   HENT: Negative for congestion.   Eyes: Negative for visual disturbance.  Respiratory: Negative for shortness of breath.   Cardiovascular: Negative for chest pain.  Gastrointestinal: Negative for abdominal pain, constipation, diarrhea, nausea and vomiting.  Genitourinary: Negative  for difficulty urinating.  Musculoskeletal: Negative for arthralgias and myalgias.  Neurological: Negative for headaches.  Psychiatric/Behavioral: Negative for sleep disturbance.    Objective:  BP (!) 141/76   Pulse 75   Temp 97.6 F (36.4 C) (Oral)   Resp 20   SpO2 98%   BP Readings from Last 3 Encounters:  03/29/20 (!) 141/76  01/02/20 (!) 171/91  12/12/19 (!) 188/98    Wt Readings from Last 3 Encounters:  12/11/19 210 lb (95.3 kg)  12/08/19 209 lb 7 oz (95 kg)  10/31/19 210 lb (95.3 kg)     Physical Exam Constitutional:      General: She is not in acute distress.    Appearance: She is well-developed.  Cardiovascular:     Rate and Rhythm: Normal rate and regular rhythm.  Pulmonary:     Breath sounds: Normal breath sounds.  Skin:    General: Skin is warm and dry.  Neurological:     Mental Status: She is alert and oriented to person, place, and time.     Diabetic Foot Exam - Simple   Simple Foot Form Diabetic Foot exam was performed with the following findings: Yes 03/29/2020  9:24 PM  Visual Inspection No deformities, no ulcerations, no other skin breakdown bilaterally: Yes  Sensation Testing Intact to touch and monofilament testing bilaterally: Yes Pulse Check Posterior Tibialis and Dorsalis pulse intact bilaterally: Yes Comments       Assessment & Plan:   Jennipher was seen today for medical management of chronic issues.  Diagnoses and all orders for this visit:  Controlled type 2 diabetes mellitus without complication, without long-term current use of insulin (HCC) -     CBC with Differential/Platelet -     CMP14+EGFR -     Lipid panel -     Bayer DCA Hb A1c Waived  Mixed hyperlipidemia -     CBC with Differential/Platelet -     CMP14+EGFR -     Lipid panel -     Bayer DCA Hb A1c Waived  Essential hypertension  Other orders -     fexofenadine (ALLEGRA) 180 MG tablet; Take 1 tablet (180 mg total) by mouth daily. For allergy symptoms   I am  having Maryann Alar. Stidd start on fexofenadine. I am also having her maintain her acetaminophen, oxyCODONE, furosemide, HYDROcodone-acetaminophen, ferrous sulfate, cholecalciferol, Cod Liver Oil, Tradjenta, B-D SINGLE USE SWABS REGULAR, metFORMIN, olmesartan, linagliptin, diltiazem, hydrALAZINE, isosorbide dinitrate, amLODipine, atorvastatin, Bystolic, True Metrix Air Glucose Meter, True Metrix Blood Glucose Test, True Metrix Level 1, and TRUEplus Lancets 33G.  Meds ordered this encounter  Medications  . fexofenadine (ALLEGRA) 180 MG tablet    Sig: Take 1 tablet (180 mg total) by mouth daily. For allergy symptoms    Dispense:  30 tablet    Refill:  11     Follow-up: Return in about 3 months (around 06/28/2020).  Claretta Fraise, M.D.

## 2020-03-30 LAB — LIPID PANEL
Chol/HDL Ratio: 2.6 ratio (ref 0.0–4.4)
Cholesterol, Total: 141 mg/dL (ref 100–199)
HDL: 54 mg/dL (ref >39)
LDL Chol Calc (NIH): 65 mg/dL (ref 0–99)
Triglycerides: 125 mg/dL (ref 0–149)
VLDL Cholesterol Cal: 22 mg/dL (ref 5–40)

## 2020-03-30 LAB — CMP14+EGFR
ALT: 7 IU/L (ref 0–32)
AST: 14 IU/L (ref 0–40)
Albumin/Globulin Ratio: 1 — ABNORMAL LOW (ref 1.2–2.2)
Albumin: 3.8 g/dL (ref 3.7–4.7)
Alkaline Phosphatase: 143 IU/L — ABNORMAL HIGH (ref 39–117)
BUN/Creatinine Ratio: 21 (ref 12–28)
BUN: 24 mg/dL (ref 8–27)
Bilirubin Total: 0.5 mg/dL (ref 0.0–1.2)
CO2: 23 mmol/L (ref 20–29)
Calcium: 9 mg/dL (ref 8.7–10.3)
Chloride: 101 mmol/L (ref 96–106)
Creatinine, Ser: 1.15 mg/dL — ABNORMAL HIGH (ref 0.57–1.00)
GFR calc Af Amer: 53 mL/min/{1.73_m2} — ABNORMAL LOW (ref >59)
GFR calc non Af Amer: 46 mL/min/{1.73_m2} — ABNORMAL LOW (ref >59)
Globulin, Total: 3.7 g/dL (ref 1.5–4.5)
Glucose: 151 mg/dL — ABNORMAL HIGH (ref 65–99)
Potassium: 4.3 mmol/L (ref 3.5–5.2)
Sodium: 138 mmol/L (ref 134–144)
Total Protein: 7.5 g/dL (ref 6.0–8.5)

## 2020-03-30 LAB — CBC WITH DIFFERENTIAL/PLATELET

## 2020-04-01 ENCOUNTER — Encounter: Payer: Self-pay | Admitting: Family Medicine

## 2020-04-02 ENCOUNTER — Ambulatory Visit (INDEPENDENT_AMBULATORY_CARE_PROVIDER_SITE_OTHER): Payer: Medicare HMO | Admitting: *Deleted

## 2020-04-02 VITALS — Ht 66.0 in | Wt 215.0 lb

## 2020-04-02 DIAGNOSIS — Z0000 Encounter for general adult medical examination without abnormal findings: Secondary | ICD-10-CM | POA: Diagnosis not present

## 2020-04-02 NOTE — Patient Instructions (Addendum)
  [facility] Maintenance Summary and Written Plan of Care  Ms. Beth [name] ,  Thank you for allowing me to perform your Medicare Annual Wellness Visit and for your ongoing commitment to your health.   Health Maintenance & Immunization History Health Maintenance  Topic Date Due  . OPHTHALMOLOGY EXAM  04/16/2019  . TETANUS/TDAP  03/29/2021 (Originally 12/24/1964)  . PNA vac Low Risk Adult (1 of 2 - PCV13) 03/29/2021 (Originally 12/24/2008)  . INFLUENZA VACCINE  07/08/2020  . HEMOGLOBIN A1C  09/28/2020  . DEXA SCAN  10/09/2020  . FOOT EXAM  03/29/2021  . COVID-19 Vaccine  Completed  . Hepatitis C Screening  Completed   Immunization History  Administered Date(s) Administered  . Influenza, High Dose Seasonal PF 08/27/2016, 09/28/2018, 09/29/2019  . Influenza,inj,Quad PF,6+ Mos 11/19/2015  . Moderna SARS-COVID-2 Vaccination 01/12/2020, 02/13/2020    These are the patient goals that we discussed: Goals Addressed            This Visit's Progress   . AWV       04/02/2020 AWV Goal: Fall Prevention  . Over the next year, patient will decrease their risk for falls by: o Using assistive devices, such as a cane or walker, as needed o Identifying fall risks within their home and correcting them by: - Removing throw rugs - Adding handrails to stairs or ramps - Removing clutter and keeping a clear pathway throughout the home - Increasing light, especially at night - Adding shower handles/bars - Raising toilet seat o Identifying potential personal risk factors for falls: - Medication side effects - Incontinence/urgency - Vestibular dysfunction - Hearing loss - Musculoskeletal disorders - Neurological disorders - Orthostatic hypotension  04/02/2020 AWV Goal: Diabetes Management  . Patient will maintain an A1C level below 8.0 . Patient will not develop any diabetic foot complications . Patient will not experience any hypoglycemic episodes over the next 3  months . Patient will notify our office of any CBG readings outside of the provider recommended range by calling [phone] . Patient will adhere to provider recommendations for diabetes management  Patient Self Management Activities . take all medications as prescribed and report any negative side effects . monitor and record blood sugar readings as directed . adhere to a low carbohydrate diet that incorporates lean proteins, vegetables, whole grains, low glycemic fruits . check feet daily noting any sores, cracks, injuries, or callous formations . see PCP or podiatrist if she notices any changes in her legs, feet, or toenails . Patient will visit PCP and have an A1C level checked every 3 to 6 months as directed  . have a yearly eye exam to monitor for vascular changes associated with diabetes and will request that the report be sent to her pcp.  . consult with her PCP regarding any changes in her health or new or worsening symptoms         This is a list of Health Maintenance Items that are overdue or due now: Health Maintenance Due  Topic Date Due  . OPHTHALMOLOGY EXAM  04/16/2019     Orders/Referrals Placed Today: No orders of the defined types were placed in this encounter.  (Contact our referral department at [phone] if you have not spoken with someone about your referral appointment within the next 5 days)    Follow-up Plan . Follow-up with Claretta Fraise, MD as planned . Schedule your yearly eye exam

## 2020-04-02 NOTE — Progress Notes (Signed)
MEDICARE ANNUAL WELLNESS VISIT  04/02/2020  Telephone Visit Disclaimer This Medicare AWV was conducted by telephone due to national recommendations for restrictions regarding the COVID-19 Pandemic (e.g. social distancing).  I verified, using two identifiers, that I am speaking with Beth [name] or their authorized healthcare agent. I discussed the limitations, risks, security, and privacy concerns of performing an evaluation and management service by telephone and the potential availability of an in-person appointment in the future. The patient expressed understanding and agreed to proceed.   Subjective:  Beth [name] is a 74 y.o. female patient of Beth [name] who had a Medicare Annual Wellness Visit today via telephone. Beth [name] is Retired and lives alone. she has 6 living adult children and 3 sons who have passed away. she reports that she is socially active and does interact with friends/family regularly. she is minimally physically active and enjoys word finds, reading, writing, and sit outside.  Patient Care Team: Claretta Fraise, [name] as PCP - General (Family Medicine) Claretta Fraise, [name] (Family Medicine) [name], [name] as Consulting Physician (Orthopedic Surgery)  Advanced Directives 04/02/2020 12/11/2019 12/03/2018 12/03/2018 12/03/2018 11/03/2018 10/27/2018  Does Patient Have a Medical Advance Directive? No No Yes Yes No No No  Type of Advance Directive - Programmer, multimedia of Attorney - - -  Does patient want to make changes to medical advance directive? - - No - Patient declined No - Patient declined - - -  Copy of [place] in Chart? - - No - copy requested No - copy requested - - -  Would patient like information on creating a medical advance directive? No - Patient declined No - Patient declined - - - Yes (Inpatient - patient requests chaplain consult to create a medical advance directive) Yes (Inpatient - patient requests  chaplain consult to create a medical advance directive)  Pre-existing out of facility DNR order (yellow form or pink MOST form) - - - - - - -    Hospital Utilization Over the Past 12 Months: # of hospitalizations or ER visits: 2 # of surgeries: 0  Review of Systems    Patient reports that her overall health is unchanged compared to last year.  History obtained from chart review and the patient  Patient Reported Readings (BP, Pulse, CBG, Weight, etc) cbg 124  Pain Assessment Pain : No/denies pain     Current Medications & Allergies (verified) Allergies as of 04/02/2020      Reactions   Aleve [naproxen Sodium] Other (See Comments)   Heart races   Asa [aspirin] Other (See Comments)   Nose bleeding   Ciprofloxacin Other (See Comments)   Possible hamstring tendinopathy   Clonidine Derivatives Other (See Comments)   Dizziness and weakness   Shellfish Allergy Nausea And Vomiting      Medication List       Accurate as of April 02, 2020 11:48 AM. If you have any questions, ask your nurse or doctor.        STOP taking these medications   oxyCODONE 5 MG immediate release tablet Commonly known as: Oxy IR/ROXICODONE     TAKE these medications   acetaminophen 500 MG tablet Commonly known as: TYLENOL Take 2 tablets (1,000 mg total) by mouth every 6 (six) hours as needed for mild pain.   amLODipine 10 MG tablet Commonly known as: NORVASC Take 1 tablet (10 mg total) by mouth every evening.   atorvastatin 20 MG tablet  Commonly known as: LIPITOR Take 1 tablet (20 mg total) by mouth every evening.   B-D SINGLE USE SWABS REGULAR Pads Use to check glucose up to four times daily Dx G95.6   Bystolic 20 MG Tabs Generic drug: Nebivolol HCl TAKE ONE (1) TABLET EACH DAY   cholecalciferol 25 MCG (1000 UNIT) tablet Commonly known as: VITAMIN D3 Take 1,000 Units by mouth daily.   Cod Liver Oil Caps Take 1 capsule by mouth daily.   diltiazem 240 MG 24 hr capsule Commonly  known as: CARDIZEM CD TAKE ONE (1) CAPSULE EACH DAY   ferrous sulfate 325 (65 FE) MG tablet Take 325 mg by mouth daily.   fexofenadine 180 MG tablet Commonly known as: ALLEGRA Take 1 tablet (180 mg total) by mouth daily. For allergy symptoms   furosemide 20 MG tablet Commonly known as: LASIX Take 1 tablet (20 mg total) by mouth daily.   hydrALAZINE 100 MG tablet Commonly known as: APRESOLINE TAKE ONE (1) TABLET THREE (3) TIMES EACH DAY   HYDROcodone-acetaminophen 5-325 MG tablet Commonly known as: NORCO/VICODIN Take 1 tablet by mouth every 6 (six) hours as needed. What changed: reasons to take this   isosorbide dinitrate 10 MG tablet Commonly known as: ISORDIL TAKE ONE (1) TABLET THREE (3) TIMES EACH DAY   linagliptin 5 MG Tabs tablet Commonly known as: Tradjenta Take 1 tablet (5 mg total) by mouth daily.   metFORMIN 500 MG 24 hr tablet Commonly known as: GLUCOPHAGE-XR TAKE ONE (1) TABLET EACH DAY   olmesartan 40 MG tablet Commonly known as: BENICAR TAKE ONE (1) TABLET EACH DAY   True Metrix Air Glucose Meter w/Device Kit 1 Device by Does not apply route 4 (four) times daily. Dx E11.9   True Metrix Blood Glucose Test test strip Generic drug: glucose blood Use to check glucose up to four times daily   True Metrix Level 1 Low Soln Use with glucose monitor Dx E11.9   TRUEplus Lancets 33G Misc Test QID Dx E11.9       History (reviewed): Past Medical History:  Diagnosis Date  . Anemia   . Arthritis    Knee both knees  . Blood transfusion without reported diagnosis 2012   anemia;pt denies transfusion stated was only on iron tablet  . Cataract    left  . CKD (chronic kidney disease), stage III   . CVA (cerebral infarction)    2006  . Diabetes mellitus without complication ([place])   . Family history of anesthesia complication    sister very slow to awaken after anesthesia;severe vomiting   . Gout    left elbow  . Groin pain 06/21/2019  . Hamstring  tendinitis 08/22/2019  . Heart murmur   . 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 08-09-14 Right eye  . History of gout 01/04/2019  . Hyperlipidemia   . Hypertension   . Nocturia    3-4 times per night  . 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 Stroke [facility]) 2006   x 1 no deficits noted    Past Surgical History:  Procedure Laterality Date  . ABDOMINAL HYSTERECTOMY  1983  . CARPAL TUNNEL RELEASE Right 1983  . colonscopy  June 21, 2014  . EXCISIONAL TOTAL KNEE ARTHROPLASTY WITH ANTIBIOTIC SPACERS Right 02/16/2015   Procedure: RIGHT KNEE RESECTION ARTHROPLASTY WITH ANTIBIOTIC SPACERS;  Surgeon: [name], [name];  Location: [facility];  Service: Orthopedics;  Laterality: Right;  . EXCISIONAL TOTAL KNEE  ARTHROPLASTY WITH ANTIBIOTIC SPACERS Right 11/03/2018   Procedure: Right knee resection arthroplasty; antibiotic spacer;  Surgeon: [name], [name];  Location: [facility];  Service: Orthopedics;  Laterality: Right;  Adductor Block  . I & D KNEE WITH POLY EXCHANGE Right 10/02/2014   Procedure: IRRIGATION AND DEBRIDEMENT RIGHT KNEE WITH POLY EXCHANGE;  Surgeon: Gearlean Alf, [name];  Location: [facility];  Service: Orthopedics;  Laterality: Right;  . INCISION AND DRAINAGE OF WOUND Right 01/14/2017   Procedure: IRRIGATION AND DEBRIDEMENT WOUND;  Surgeon: [name], [name];  Location: [facility];  Service: Orthopedics;  Laterality: Right;  requests 10mns  . JOINT REPLACEMENT  06/2014   right knee  . nasal cauterization  2012  . PATELLAR TENDON REPAIR Right 08/11/2014   Procedure: RIGHT PATELLA TENDON REPAIR;  Surgeon: FGearlean Alf [name];  Location: [facility];  Service: Orthopedics;  Laterality: Right;  . REIMPLANTATION OF TOTAL KNEE Right 05/23/2015   Procedure: RIGHT KNEE ARTHROPLASTY REIMPLANTATION;  Surgeon: FGaynelle Arabian [name];  Location: [facility];  Service: Orthopedics;  Laterality: Right;  . TOTAL KNEE ARTHROPLASTY Right 07/03/2014   Procedure: RIGHT  TOTAL KNEE ARTHROPLASTY;  Surgeon: FGearlean Alf [name];  Location: [facility];  Service: Orthopedics;  Laterality: Right;  . TUBAL LIGATION     Family History  Problem Relation Age of Onset  . Ovarian cancer Mother   . Cancer Mother   . Diabetes Son   . Diabetes Son   . Peripheral vascular disease Father        with amputation of both legs  . Hypertension Father   . Heart disease Brother   . Kidney disease Daughter   . Colon cancer Neg Hx   . Esophageal cancer Neg Hx   . Stomach cancer Neg Hx   . Rectal cancer Neg Hx    Social History   Socioeconomic History  . Marital status: Widowed    Spouse name: Not on file  . Number of children: 9  . Years of education: 8th  . Highest education level: 8th grade  Occupational History    Employer: RETIRED  Tobacco Use  . Smoking status: Never Smoker  . Smokeless tobacco: Never Used  Substance and Sexual Activity  . Alcohol use: No  . Drug use: No  . Sexual activity: Not Currently  Other Topics Concern  . Not on file  Social History Narrative   She has 6 living adult children and 3 who have passed away and many grandchildren and great grandchildren. Patient is retired. She worked part time cEducation administratorhouses and a dTheatre manager    Social Determinants of Health   Financial Resource Strain:   . Difficulty of Paying Living Expenses:   Food Insecurity:   . Worried About RCharity fundraiserin the Last Year:   . RArboriculturistin the Last Year:   Transportation Needs:   . LFilm/video editor(Medical):   .Marland KitchenLack of Transportation (Non-Medical):   Physical Activity:   . Days of Exercise per Week:   . Minutes of Exercise per Session:   Stress:   . Feeling of Stress :   Social Connections:   . Frequency of Communication with Friends and Family:   . Frequency of Social Gatherings with Friends and Family:   . Attends Religious Services:   . Active Member of Clubs or Organizations:   . Attends CArchivistMeetings:   .Marland Kitchen Marital Status:     Activities of Daily Living  In your present state of health, do you have any difficulty performing the following activities: 04/02/2020  Hearing? N  Vision? N  Comment Wears glasses  Difficulty concentrating or making decisions? N  Walking or climbing stairs? Y  Comment Unable due to right knee  Dressing or bathing? Y  Comment Granddaughter helps with bathing  Doing errands, shopping? N  Preparing Food and eating ? Y  Comment Aide helps prepare food  Using the Toilet? N  In the past six months, have you accidently leaked urine? N  Do you have problems with loss of bowel control? N  Managing your Medications? N  Managing your Finances? N  Housekeeping or managing your Housekeeping? Y  Comment Has help with managing housekeeping  Some recent data might be hidden    Patient Education/ Literacy How often do you need to have someone help you when you read instructions, pamphlets, or other written materials from your doctor or pharmacy?: 1 - Never What is the last grade level you completed in school?: 9th  Exercise Current Exercise Habits: Home exercise routine, Type of exercise: Other - see comments(sitting exercises), Time (Minutes): 20, Frequency (Times/Week): 4, Weekly Exercise (Minutes/Week): 80, Intensity: Mild, Exercise limited by: orthopedic condition(s)  Diet Patient reports consuming 3 meals a day and 1 snack(s) a day Patient reports that her primary diet is: Regular Patient reports that she does have regular access to food.   Depression Screen PHQ 2/9 Scores 04/02/2020 03/29/2020 01/02/2020 11/22/2019 08/22/2019 01/04/2019 06/07/2018  PHQ - 2 Score 0 0 0 0 0 0 0     Fall Risk Fall Risk  04/02/2020 03/29/2020 01/02/2020 11/22/2019 08/22/2019  Falls in the past year? 0 0 1 0 1  Comment - - - - -  Number falls in past yr: - - 0 0 1  Comment - - - - -  Injury with Fall? - - 0 0 0  Risk Factor Category  - - - - -  Risk for fall due to : - - Impaired  balance/gait;Impaired mobility;Orthopedic patient - -  Follow up - - Falls evaluation completed;Falls prevention discussed;Education provided - -     Objective:  Beth [name] seemed alert and oriented and she participated appropriately during our telephone visit.  Blood Pressure Weight BMI  BP Readings from Last 3 Encounters:  03/29/20 (!) 141/76  01/02/20 (!) 171/91  12/12/19 (!) 188/98   Wt Readings from Last 3 Encounters:  04/02/20 215 lb (97.5 kg)  12/11/19 210 lb (95.3 kg)  12/08/19 209 lb 7 oz (95 kg)   BMI Readings from Last 1 Encounters:  04/02/20 34.70 kg/m    *Unable to obtain current vital signs, weight, and BMI due to telephone visit type  Hearing/Vision  . Beth [name] did not seem to have difficulty with hearing/understanding during the telephone conversation . Reports that she has not had a formal eye exam by an eye care professional within the past year . Reports that she has not had a formal hearing evaluation within the past year *Unable to fully assess hearing and vision during telephone visit type  Cognitive Function: 6CIT Screen 04/02/2020  What Year? 0 points  What month? 0 points  What time? 0 points  Count back from 20 0 points  Months in reverse 2 points  Repeat phrase 0 points  Total Score 2   (Normal:0-7, Significant for Dysfunction: >8)  Normal Cognitive Function Screening: Yes   Immunization & Health Maintenance Record Immunization History  Administered Date(s)  Administered  . Influenza, High Dose Seasonal PF 08/27/2016, 09/28/2018, 09/29/2019  . Influenza,inj,Quad PF,6+ Mos 11/19/2015  . Moderna SARS-COVID-2 Vaccination 01/12/2020, 02/13/2020    Health Maintenance  Topic Date Due  . OPHTHALMOLOGY EXAM  04/16/2019  . TETANUS/TDAP  03/29/2021 (Originally 12/24/1964)  . PNA vac Low Risk Adult (1 of 2 - PCV13) 03/29/2021 (Originally 12/24/2008)  . INFLUENZA VACCINE  07/08/2020  . HEMOGLOBIN A1C  09/28/2020  . DEXA SCAN  10/09/2020  . FOOT  EXAM  03/29/2021  . COVID-19 Vaccine  Completed  . Hepatitis C Screening  Completed       Assessment  This is a routine wellness examination for Beth [name].  Health Maintenance: Due or Overdue Health Maintenance Due  Topic Date Due  . OPHTHALMOLOGY EXAM  04/16/2019    Beth [name] does not need a referral for Community Assistance: Care Management:   no Social Work:    no Prescription Assistance:  no Nutrition/Diabetes Education:  no   Goals    . AWV     04/02/2020 AWV Goal: Fall Prevention  . Over the next year, patient will decrease their risk for falls by: o Using assistive devices, such as a cane or walker, as needed o Identifying fall risks within their home and correcting them by: - Removing throw rugs - Adding handrails to stairs or ramps - Removing clutter and keeping a clear pathway throughout the home - Increasing light, especially at night - Adding shower handles/bars - Raising toilet seat o Identifying potential personal risk factors for falls: - Medication side effects - Incontinence/urgency - Vestibular dysfunction - Hearing loss - Musculoskeletal disorders - Neurological disorders - Orthostatic hypotension  04/02/2020 AWV Goal: Diabetes Management  . Patient will maintain an A1C level below 8.0 . Patient will not develop any diabetic foot complications . Patient will not experience any hypoglycemic episodes over the next 3 months . Patient will notify our office of any CBG readings outside of the provider recommended range by calling [phone] . Patient will adhere to provider recommendations for diabetes management  Patient Self Management Activities . take all medications as prescribed and report any negative side effects . monitor and record blood sugar readings as directed . adhere to a low carbohydrate diet that incorporates lean proteins, vegetables, whole grains, low glycemic fruits . check feet daily noting any sores, cracks, injuries,  or callous formations . see PCP or podiatrist if she notices any changes in her legs, feet, or toenails . Patient will visit PCP and have an A1C level checked every 3 to 6 months as directed  . have a yearly eye exam to monitor for vascular changes associated with diabetes and will request that the report be sent to her pcp.  . consult with her PCP regarding any changes in her health or new or worsening symptoms       Personalized Health Maintenance & Screening Recommendations  Eye Exam  Lung Cancer Screening Recommended: no (Low Dose CT Chest recommended if Age 67-80 years, 30 pack-year currently smoking OR have quit w/in past 15 years) Hepatitis C Screening recommended: no HIV Screening recommended: no  Advanced Directives: Written information was not prepared per patient's request.  Referrals & Orders No orders of the defined types were placed in this encounter.   Follow-up Plan . Follow-up with Claretta Fraise, [name] as planned . Schedule your yearly eye exam . AVS printed and mailed to patient     I have personally reviewed and noted the  following in the patient's chart:   . Medical and social history . Use of alcohol, tobacco or illicit drugs  . Current medications and supplements . Functional ability and status . Nutritional status . Physical activity . Advanced directives . List of other physicians . Hospitalizations, surgeries, and ER visits in previous 12 months . Vitals . Screenings to include cognitive, depression, and falls . Referrals and appointments  In addition, I have reviewed and discussed with Beth [name] certain preventive protocols, quality metrics, and best practice recommendations. A written personalized care plan for preventive services as well as general preventive health recommendations is available and can be mailed to the patient at her request.      Gareth Morgan, LPN  1/66/0630

## 2020-04-04 ENCOUNTER — Other Ambulatory Visit: Payer: Self-pay | Admitting: Family Medicine

## 2020-04-05 DIAGNOSIS — M79676 Pain in unspecified toe(s): Secondary | ICD-10-CM | POA: Diagnosis not present

## 2020-04-05 DIAGNOSIS — B351 Tinea unguium: Secondary | ICD-10-CM | POA: Diagnosis not present

## 2020-04-05 DIAGNOSIS — E1142 Type 2 diabetes mellitus with diabetic polyneuropathy: Secondary | ICD-10-CM | POA: Diagnosis not present

## 2020-04-05 DIAGNOSIS — L84 Corns and callosities: Secondary | ICD-10-CM | POA: Diagnosis not present

## 2020-04-16 ENCOUNTER — Other Ambulatory Visit: Payer: Self-pay | Admitting: Family Medicine

## 2020-04-26 ENCOUNTER — Other Ambulatory Visit: Payer: Self-pay

## 2020-04-26 ENCOUNTER — Encounter: Payer: Self-pay | Admitting: Infectious Disease

## 2020-04-26 ENCOUNTER — Ambulatory Visit: Payer: Medicare HMO | Admitting: Infectious Disease

## 2020-04-26 VITALS — BP 156/87 | HR 71 | Temp 97.9°F

## 2020-04-26 DIAGNOSIS — A498 Other bacterial infections of unspecified site: Secondary | ICD-10-CM

## 2020-04-26 DIAGNOSIS — T8453XD Infection and inflammatory reaction due to internal right knee prosthesis, subsequent encounter: Secondary | ICD-10-CM | POA: Diagnosis not present

## 2020-04-26 NOTE — Progress Notes (Signed)
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   MRN: [id number]  HPI  Beth [name] is a 74  y.o. 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  She apparently originally had right knee total arthroplasty in July 2015.  In the interim she had an I&D in October 2015 with poly-exchange followed by antibiotics.  She then had in 2016 a right knee resection arthroplasty with placement of antibiotic spacers followed by antibiotics and followed by reimplantation of new prosthetic knee.  Unfortunately she is again had failure and of the attempts to eradicate infection in his knee and her prosthetic knee has been removed and antibiotic spacer placed  Is my understanding that no organism had been isolated in the past.   She subsequently grew a pansensitive pseudomonal species.  She was discharged on cefepime but then admitted with renal failure and changed to oral ciprofloxacin she was having trouble with her PICC line as well.  Renal function had improved during her hospitalization and she just continued on ciprofloxacin in the skilled nursing facility.  Knee pain is better now versus when she was in the hospital.  She was due to complete the ciprofloxacin on 10 January   In the interim she was seen by me and we rechecked inflammatory markers which were still quite high sed rate still in the 70s and CRP had gone up into the 20s.  Therefore I had continued her on oral ciprofloxacin in the nursing facility.  When she was discharged some skilled nurse facility on 20 January they unfortunately did not send her home with oral antibiotics.  She then resumed the oral l ciprofloxacin.  However in the interim she has developed some severe right-sided groin pain   We obtained an MRI of the hip in August 2020 which did not show osteomyelitis, or septic  hep but did show severe osteoarthritic changes of the sacroiliac joints bilaterally and also question of some hamstring tendinosis.  Beth [name] saw the patient in clinic reviewed films and had some concern whether or not the tendinosis seen on imaging could be related to chronic fluoroquinolone usage.  The patient's pain in her knee had improved as mentioned inflammatory markers had trended down.  Patient was taken off ciprofloxacin to see how she would do both in terms of her groin and hip pain and also her knee pain.  Since then her hip pain is improved dramatically suggesting the possibility that fluoroquinolones could have been in play in terms of causing a tendinopathy.   Her knee pain hafremained stable off of antibiotics  Since August of 2020.  When I saw her last I was encouraged by how she was doing but when I checked her inflammatory markers they had become more elevated.  She has been seen by Dr. Maureen Ralphs and recounts him removing fluid from her knee and sending this for analysis and culture.  However when I went into "[facility] everywhere portal the notes indicated that fluid cannot be withdrawn.   Dr. Maureen Ralphs also mentioned doing an MRI of the knee prior to considering reimplanting a new knee.    Did obtain MRI of the knee on December 28, 2019 and this was read as showing:   Antibiotic laden cement with some postsurgical changes in the distal femur and proximal tibia there was some  irregular enhancement on the margins of the tracks that were felt by radiology possibly concerning for osteomyelitis versus residual postsurgical changes.  I have also personally reviewed these films  I alerted Dr. Maureen Ralphs re this MRI and he also reviewed himself.  He felt the changes were more consistent with abrasive changes that can happen in the context of chronic antibiotic cement being in place he was less concerned by the radiographic appearance for possible osteomyelitis.  We both continue to be  concerned about her elevated inflammatory markers.  She does have a history of gout though it has not flared all in recent times she is really not on any medicines for gout at present.  See me today now 4 months after her MRI.  Knee pain is stable largely occurs at night or if she shifts around and bears weight on it.  Inflammatory markers have been abnormal other than 1 of 28 for the past 9 years that I can see.       Past Medical History:  Diagnosis Date  . Anemia   . Arthritis    Knee both knees  . Blood transfusion without reported diagnosis 2012   anemia;pt denies transfusion stated was only on iron tablet  . Cataract    left  . CKD (chronic kidney disease), stage III   . CVA (cerebral infarction)    2006  . Diabetes mellitus without complication ([place])   . Family history of anesthesia complication    sister very slow to awaken after anesthesia;severe vomiting   . Gout    left elbow  . Groin pain 06/21/2019  . Hamstring tendinitis 08/22/2019  . Heart murmur   . 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 08-09-14 Right eye  . History of gout 01/04/2019  . Hyperlipidemia   . Hypertension   . Nocturia    3-4 times per night  . 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 Stroke [facility]) 2006   x 1 no deficits noted     Past Surgical History:  Procedure Laterality Date  . ABDOMINAL HYSTERECTOMY  1983  . CARPAL TUNNEL RELEASE Right 1983  . colonscopy  June 21, 2014  . EXCISIONAL TOTAL KNEE ARTHROPLASTY WITH ANTIBIOTIC SPACERS Right 02/16/2015   Procedure: RIGHT KNEE RESECTION ARTHROPLASTY WITH ANTIBIOTIC SPACERS;  Surgeon: [name], MD;  Location: [facility];  Service: Orthopedics;  Laterality: Right;  . EXCISIONAL TOTAL KNEE ARTHROPLASTY WITH ANTIBIOTIC SPACERS Right 11/03/2018   Procedure: Right knee resection arthroplasty; antibiotic spacer;  Surgeon: [name], MD;  Location: [facility];  Service: Orthopedics;   Laterality: Right;  Adductor Block  . I & D KNEE WITH POLY EXCHANGE Right 10/02/2014   Procedure: IRRIGATION AND DEBRIDEMENT RIGHT KNEE WITH POLY EXCHANGE;  Surgeon: Gearlean Alf, MD;  Location: [facility];  Service: Orthopedics;  Laterality: Right;  . INCISION AND DRAINAGE OF WOUND Right 01/14/2017   Procedure: IRRIGATION AND DEBRIDEMENT WOUND;  Surgeon: [name], MD;  Location: [facility];  Service: Orthopedics;  Laterality: Right;  requests 63mns  . JOINT REPLACEMENT  06/2014   right knee  . nasal cauterization  2012  . PATELLAR TENDON REPAIR Right 08/11/2014   Procedure: RIGHT PATELLA TENDON REPAIR;  Surgeon: FGearlean Alf MD;  Location: [facility];  Service: Orthopedics;  Laterality: Right;  . REIMPLANTATION OF TOTAL KNEE Right 05/23/2015   Procedure: RIGHT KNEE ARTHROPLASTY REIMPLANTATION;  Surgeon: FGaynelle Arabian MD;  Location: [facility];  Service: Orthopedics;  Laterality: Right;  . TOTAL KNEE ARTHROPLASTY Right 07/03/2014   Procedure: RIGHT TOTAL KNEE ARTHROPLASTY;  Surgeon: Gearlean Alf, MD;  Location: [facility];  Service: Orthopedics;  Laterality: Right;  . TUBAL LIGATION      Family History  Problem Relation Age of Onset  . Ovarian cancer Mother   . Cancer Mother   . Diabetes Son   . Diabetes Son   . Peripheral vascular disease Father        with amputation of both legs  . Hypertension Father   . Heart disease Brother   . Kidney disease Daughter   . Colon cancer Neg Hx   . Esophageal cancer Neg Hx   . Stomach cancer Neg Hx   . Rectal cancer Neg Hx       Social History   Socioeconomic History  . Marital status: Widowed    Spouse name: Not on file  . Number of children: 9  . Years of education: 8th  . Highest education level: 8th grade  Occupational History    Employer: RETIRED  Tobacco Use  . Smoking status: Never Smoker  . Smokeless tobacco: Never Used  Substance and Sexual Activity  . Alcohol use: No  . Drug use: No  . Sexual activity: Not Currently  Other  Topics Concern  . Not on file  Social History Narrative   She has 6 living adult children and 3 who have passed away and many grandchildren and great grandchildren. Patient is retired. 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    Social Determinants of Health   Financial Resource Strain:   . Difficulty of Paying Living Expenses:   Food Insecurity:   . Worried About Charity fundraiser in the Last Year:   . Arboriculturist in the Last Year:   Transportation Needs:   . Film/video editor (Medical):   Marland Kitchen Lack of Transportation (Non-Medical):   Physical Activity:   . Days of Exercise per Week:   . Minutes of Exercise per Session:   Stress:   . Feeling of Stress :   Social Connections:   . Frequency of Communication with Friends and Family:   . Frequency of Social Gatherings with Friends and Family:   . Attends Religious Services:   . Active Member of Clubs or Organizations:   . Attends Archivist Meetings:   Marland Kitchen Marital Status:     Allergies  Allergen Reactions  . Aleve [Naproxen Sodium] Other (See Comments)    Heart races  . Asa [Aspirin] Other (See Comments)    Nose bleeding  . Ciprofloxacin Other (See Comments)    Possible hamstring tendinopathy  . Clonidine Derivatives Other (See Comments)    Dizziness and weakness  . Shellfish Allergy Nausea And Vomiting     Current Outpatient Medications:  .  acetaminophen (TYLENOL) 500 MG tablet, Take 2 tablets (1,000 mg total) by mouth every 6 (six) hours as needed for mild pain., Disp: 90 tablet, Rfl: 1 .  Alcohol Swabs (B-D SINGLE USE SWABS REGULAR) PADS, Use to check glucose up to four times daily Dx E11.9, Disp: 400 each, Rfl: 3 .  amLODipine (NORVASC) 10 MG tablet, TAKE 1 TABLET DAILY IN THE EVENING, Disp: 90 tablet, Rfl: 1 .  atorvastatin (LIPITOR) 20 MG tablet, TAKE 1 TABLET DAILY IN THE EVENING, Disp: 90 tablet, Rfl: 1 .  Blood Glucose Calibration (TRUE METRIX LEVEL 1) Low SOLN, Use with glucose  monitor Dx E11.9,  Disp: 3 each, Rfl: 0 .  Blood Glucose Monitoring Suppl (TRUE METRIX AIR GLUCOSE METER) w/Device KIT, 1 Device by Does not apply route 4 (four) times daily. Dx E11.9, Disp: 1 kit, Rfl: 0 .  cholecalciferol (VITAMIN D3) 25 MCG (1000 UT) tablet, Take 1,000 Units by mouth daily., Disp: , Rfl:  .  Cod Liver Oil CAPS, Take 1 capsule by mouth daily., Disp: , Rfl:  .  diltiazem (CARDIZEM CD) 240 MG 24 hr capsule, TAKE ONE (1) CAPSULE EACH DAY, Disp: 90 capsule, Rfl: 0 .  ferrous sulfate 325 (65 FE) MG tablet, Take 325 mg by mouth daily., Disp: , Rfl:  .  fexofenadine (ALLEGRA) 180 MG tablet, Take 1 tablet (180 mg total) by mouth daily. For allergy symptoms, Disp: 30 tablet, Rfl: 11 .  furosemide (LASIX) 20 MG tablet, Take 1 tablet (20 mg total) by mouth daily. (Patient not taking: Reported on 03/29/2020), Disp: 30 tablet, Rfl: 5 .  glucose blood (TRUE METRIX BLOOD GLUCOSE TEST) test strip, Use to check glucose up to four times daily, Disp: 400 each, Rfl: 3 .  hydrALAZINE (APRESOLINE) 100 MG tablet, TAKE ONE (1) TABLET THREE (3) TIMES EACH DAY, Disp: 270 tablet, Rfl: 0 .  HYDROcodone-acetaminophen (NORCO/VICODIN) 5-325 MG tablet, Take 1 tablet by mouth every 6 (six) hours as needed. (Patient taking differently: Take 1 tablet by mouth every 6 (six) hours as needed for moderate pain or severe pain. ), Disp: 14 tablet, Rfl: 0 .  isosorbide dinitrate (ISORDIL) 10 MG tablet, TAKE 1 TABLET THREE TIMES DAILY, Disp: 90 tablet, Rfl: 2 .  metFORMIN (GLUCOPHAGE-XR) 500 MG 24 hr tablet, TAKE ONE (1) TABLET EACH DAY, Disp: 90 tablet, Rfl: 0 .  Nebivolol HCl (BYSTOLIC) 20 MG TABS, TAKE ONE (1) TABLET EACH DAY, Disp: 90 tablet, Rfl: 0 .  olmesartan (BENICAR) 40 MG tablet, TAKE ONE (1) TABLET EACH DAY, Disp: 90 tablet, Rfl: 1 .  TRADJENTA 5 MG TABS tablet, TAKE ONE (1) TABLET EACH DAY, Disp: 30 tablet, Rfl: 0 .  TRUEplus Lancets 33G MISC, Test QID Dx E11.9, Disp: 400 each, Rfl: 3 No current  facility-administered medications for this visit.  Facility-Administered Medications Ordered in Other Visits:  .  tranexamic acid (CYKLOKAPRON) 2,000 mg in sodium chloride 0.9 % 50 mL Topical Application, 5,537 mg, Topical, Once, Aluisio, Frank, MD  Review of Systems  Constitutional: Negative for activity change, appetite change, chills, diaphoresis, fatigue, fever and unexpected weight change.  HENT: Negative for congestion, rhinorrhea, sinus pressure, sneezing, sore throat and trouble swallowing.   Eyes: Negative for photophobia and visual disturbance.  Respiratory: Negative for cough, chest tightness, shortness of breath, wheezing and stridor.   Cardiovascular: Negative for chest pain, palpitations and leg swelling.  Gastrointestinal: Negative for abdominal distention, abdominal pain, anal bleeding, blood in stool, constipation, diarrhea, nausea and vomiting.  Genitourinary: Negative for difficulty urinating, dysuria, flank pain and hematuria.  Musculoskeletal: Positive for arthralgias. Negative for back pain, joint swelling and myalgias.  Skin: Negative for color change, pallor and rash.  Neurological: Negative for dizziness, tremors, weakness and light-headedness.  Hematological: Negative for adenopathy. Does not bruise/bleed easily.  Psychiatric/Behavioral: Negative for agitation, behavioral problems, confusion, decreased concentration, dysphoric mood and sleep disturbance. The patient is not nervous/anxious.        Objective:   Physical Exam Constitutional:      General: She is not in acute distress.    Appearance: She is not diaphoretic.  HENT:     Head: Normocephalic and atraumatic.  Right Ear: External ear normal.     Left Ear: External ear normal.     Nose: Nose normal.  Eyes:     General: No scleral icterus.    Extraocular Movements: Extraocular movements intact.     Conjunctiva/sclera: Conjunctivae normal.  Cardiovascular:     Rate and Rhythm: Normal rate and  regular rhythm.  Pulmonary:     Effort: Pulmonary effort is normal. No respiratory distress.  Abdominal:     General: There is no distension.  Musculoskeletal:     Cervical back: Normal range of motion and neck supple.  Lymphadenopathy:     Cervical: No cervical adenopathy.  Skin:    General: Skin is warm and dry.     Coloration: Skin is not pale.     Findings: No erythema or rash.  Neurological:     General: No focal deficit present.     Mental Status: She is alert and oriented to person, place, and time.     Coordination: Coordination normal.  Psychiatric:        Attention and Perception: Attention normal.        Mood and Affect: Mood normal. Mood is not anxious.        Behavior: Behavior normal.        Thought Content: Thought content normal.        Judgment: Judgment normal.      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  Given the MRI findings in January 21 I think she needs a repeat MRI and I will plan on doing this in late July.  She can follow-up with me in August.  Covid vaccination she has been vaccinated for COVID-19

## 2020-04-27 LAB — BASIC METABOLIC PANEL WITH GFR
BUN/Creatinine Ratio: 23 (calc) — ABNORMAL HIGH (ref 6–22)
BUN: 27 mg/dL — ABNORMAL HIGH (ref 7–25)
CO2: 25 mmol/L (ref 20–32)
Calcium: 9.5 mg/dL (ref 8.6–10.4)
Chloride: 103 mmol/L (ref 98–110)
Creat: 1.19 mg/dL — ABNORMAL HIGH (ref 0.60–0.93)
GFR, Est African American: 51 mL/min/{1.73_m2} — ABNORMAL LOW (ref >=60)
GFR, Est Non African American: 44 mL/min/{1.73_m2} — ABNORMAL LOW (ref >=60)
Glucose, Bld: 156 mg/dL — ABNORMAL HIGH (ref 65–99)
Potassium: 4.2 mmol/L (ref 3.5–5.3)
Sodium: 137 mmol/L (ref 135–146)

## 2020-04-27 LAB — SED RATE MANUAL WEST RFLX: SED RATE BY MODIFIED WESTERGREN,MANUAL: 62 mm/h — ABNORMAL HIGH (ref 0–30)

## 2020-04-27 LAB — SEDIMENTATION RATE

## 2020-04-27 LAB — C-REACTIVE PROTEIN: CRP: 7.1 mg/L (ref <8.0)

## 2020-05-21 ENCOUNTER — Other Ambulatory Visit: Payer: Self-pay | Admitting: Family Medicine

## 2020-06-14 ENCOUNTER — Other Ambulatory Visit: Payer: Self-pay | Admitting: Infectious Disease

## 2020-06-14 DIAGNOSIS — T8459XA Infection and inflammatory reaction due to other internal joint prosthesis, initial encounter: Secondary | ICD-10-CM

## 2020-06-14 NOTE — Progress Notes (Signed)
ta

## 2020-06-28 DIAGNOSIS — L84 Corns and callosities: Secondary | ICD-10-CM | POA: Diagnosis not present

## 2020-06-28 DIAGNOSIS — M79676 Pain in unspecified toe(s): Secondary | ICD-10-CM | POA: Diagnosis not present

## 2020-06-28 DIAGNOSIS — B351 Tinea unguium: Secondary | ICD-10-CM | POA: Diagnosis not present

## 2020-06-28 DIAGNOSIS — E1142 Type 2 diabetes mellitus with diabetic polyneuropathy: Secondary | ICD-10-CM | POA: Diagnosis not present

## 2020-06-29 ENCOUNTER — Ambulatory Visit (HOSPITAL_COMMUNITY)
Admission: RE | Admit: 2020-06-29 | Discharge: 2020-06-29 | Disposition: A | Discharge status: Home / Self Care | Payer: Medicare HMO | Source: Ambulatory Visit | Attending: Infectious Disease | Admitting: Infectious Disease

## 2020-06-29 ENCOUNTER — Other Ambulatory Visit: Payer: Self-pay | Admitting: Infectious Disease

## 2020-06-29 ENCOUNTER — Other Ambulatory Visit: Payer: Self-pay

## 2020-06-29 DIAGNOSIS — A498 Other bacterial infections of unspecified site: Secondary | ICD-10-CM

## 2020-06-29 DIAGNOSIS — T8453XD Infection and inflammatory reaction due to internal right knee prosthesis, subsequent encounter: Secondary | ICD-10-CM

## 2020-06-29 DIAGNOSIS — Z96651 Presence of right artificial knee joint: Secondary | ICD-10-CM | POA: Diagnosis not present

## 2020-06-29 DIAGNOSIS — Z471 Aftercare following joint replacement surgery: Secondary | ICD-10-CM | POA: Diagnosis not present

## 2020-06-29 DIAGNOSIS — R600 Localized edema: Secondary | ICD-10-CM | POA: Diagnosis not present

## 2020-06-29 DIAGNOSIS — M6258 Muscle wasting and atrophy, not elsewhere classified, other site: Secondary | ICD-10-CM | POA: Diagnosis not present

## 2020-06-29 MED ORDER — GADOBUTROL 1 MMOL/ML IV SOLN: 10.0000 mL | Freq: Once | INTRAVENOUS | Status: DC | PRN

## 2020-06-29 NOTE — Progress Notes (Signed)
MRI of the right knee without gadolinium completed. Patient terminated the exam and refused to continue due to pain.

## 2020-07-02 ENCOUNTER — Telehealth: Payer: Self-pay | Admitting: *Deleted

## 2020-07-02 NOTE — Telephone Encounter (Signed)
Patient follows up at [facility] 8/16 [facility]), but no follow up at [facility] ([facility]). Last office visit there was 02/10/20. RN faxed MRI report to Dr Wynelle Link at [phone]. Does the patient need anything additional? Landis Gandy, RN

## 2020-07-02 NOTE — Telephone Encounter (Signed)
-----   Message from Truman Hayward, MD sent at 07/01/2020  2:43 PM EDT ----- Does pt have followup with me soon and Orthopedics?

## 2020-07-03 ENCOUNTER — Other Ambulatory Visit: Payer: Self-pay | Admitting: Family Medicine

## 2020-07-03 NOTE — Telephone Encounter (Signed)
If she is not systemically ill she can keep her appt with me and Dr Maureen Ralphs. The report is concerning for possible bone infection

## 2020-07-03 NOTE — Telephone Encounter (Signed)
Spoke with patient. She feels fine, only a little pain in her knee,  rating it at about 5-6/10. She denies fever, redness or new swelling in her knee - only the same pain she has had in her knee for "quite a while." She has a good appetite, no diarrhea. She will keep her appointment 8/16 at 2:00 with Dr Tommy Medal.  RN let her know that we sent the MRI to Dr Aluisio's office too, so they may be calling her for an appointment as well. Landis Gandy, RN

## 2020-07-04 NOTE — Telephone Encounter (Signed)
Ok thanks [facility]!

## 2020-07-17 ENCOUNTER — Ambulatory Visit: Payer: Medicare HMO | Admitting: Infectious Disease

## 2020-07-18 ENCOUNTER — Other Ambulatory Visit: Payer: Self-pay | Admitting: Family Medicine

## 2020-07-23 ENCOUNTER — Other Ambulatory Visit: Payer: Self-pay

## 2020-07-23 ENCOUNTER — Ambulatory Visit (INDEPENDENT_AMBULATORY_CARE_PROVIDER_SITE_OTHER): Payer: Medicare HMO | Admitting: Infectious Disease

## 2020-07-23 ENCOUNTER — Encounter: Payer: Self-pay | Admitting: Infectious Disease

## 2020-07-23 VITALS — BP 136/77 | HR 56 | Temp 97.9°F

## 2020-07-23 DIAGNOSIS — E1122 Type 2 diabetes mellitus with diabetic chronic kidney disease: Secondary | ICD-10-CM | POA: Diagnosis not present

## 2020-07-23 DIAGNOSIS — M25561 Pain in right knee: Secondary | ICD-10-CM | POA: Diagnosis not present

## 2020-07-23 DIAGNOSIS — T8453XA Infection and inflammatory reaction due to internal right knee prosthesis, initial encounter: Secondary | ICD-10-CM

## 2020-07-23 DIAGNOSIS — T8453XD Infection and inflammatory reaction due to internal right knee prosthesis, subsequent encounter: Secondary | ICD-10-CM

## 2020-07-23 DIAGNOSIS — M461 Sacroiliitis, not elsewhere classified: Secondary | ICD-10-CM | POA: Diagnosis not present

## 2020-07-23 DIAGNOSIS — A498 Other bacterial infections of unspecified site: Secondary | ICD-10-CM

## 2020-07-23 DIAGNOSIS — N189 Chronic kidney disease, unspecified: Secondary | ICD-10-CM | POA: Diagnosis not present

## 2020-07-23 DIAGNOSIS — M47818 Spondylosis without myelopathy or radiculopathy, sacral and sacrococcygeal region: Secondary | ICD-10-CM

## 2020-07-23 DIAGNOSIS — M109 Gout, unspecified: Secondary | ICD-10-CM

## 2020-07-23 DIAGNOSIS — M76891 Other specified enthesopathies of right lower limb, excluding foot: Secondary | ICD-10-CM

## 2020-07-23 DIAGNOSIS — M86661 Other chronic osteomyelitis, right tibia and fibula: Secondary | ICD-10-CM

## 2020-07-23 DIAGNOSIS — I129 Hypertensive chronic kidney disease with stage 1 through stage 4 chronic kidney disease, or unspecified chronic kidney disease: Secondary | ICD-10-CM

## 2020-07-23 DIAGNOSIS — M86151 Other acute osteomyelitis, right femur: Secondary | ICD-10-CM

## 2020-07-23 DIAGNOSIS — M869 Osteomyelitis, unspecified: Secondary | ICD-10-CM

## 2020-07-23 HISTORY — DX: Other acute osteomyelitis, right femur: M86.151

## 2020-07-23 HISTORY — DX: Osteomyelitis, unspecified: M86.9

## 2020-07-23 NOTE — Progress Notes (Signed)
Subjective:  Chief complaint: Follow-up for right prosthetic knee infection still with some pain    l Patient ID: Beth [name], female    DOB: 10/05/1946, 74 y.o.   MRN: [id number]  HPI  Beth [name] is a 74  y.o. 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  She apparently originally had right knee total arthroplasty in July 2015.  In the interim she had an I&D in October 2015 with poly-exchange followed by antibiotics.  She then had in 2016 a right knee resection arthroplasty with placement of antibiotic spacers followed by antibiotics and followed by reimplantation of new prosthetic knee.  Unfortunately she again had failure and of the attempts to eradicate infection in his knee and her prosthetic knee has been removed and antibiotic spacer placed  Is my understanding that no organism had been isolated in the past.   She subsequently grew a pansensitive pseudomonal species.  She was discharged on cefepime but then admitted with renal failure and changed to oral ciprofloxacin.  .  Knee pain qwas better now versus when she was in the hospital.  She was due to complete the ciprofloxacin on 17 December 2018    In the interim she was seen by me and we rechecked inflammatory markers which were still quite high sed rate still in the 70s and CRP had gone up into the 20s.  Therefore I had continued her on oral ciprofloxacin in the nursing facility.  When she was discharged some skilled nurse facility on 20 January they unfortunately did not send her home with oral antibiotics.  She then resumed the oral l ciprofloxacin.  However in the interim she has developed some severe right-sided groin pain   We obtained an MRI of the hip in August 2020 which did not show osteomyelitis, or septic hep but did show severe osteoarthritic changes of the sacroiliac joints bilaterally and also question of some hamstring tendinosis.  Beth [name] saw the patient in  clinic reviewed films and had some concern whether or not the tendinosis seen on imaging could be related to chronic fluoroquinolone usage.  The patient's pain in her knee had improved as mentioned inflammatory markers had trended down.  Patient was taken off ciprofloxacin to see how she would do both in terms of her groin and hip pain and also her knee pain.  Since then her hip pain  improved dramatically suggesting the possibility that fluoroquinolones could have been in play in terms of causing a tendinopathy.   Her knee pain had remained stable off of antibiotics  Since August of 2020.  When I saw her last I was encouraged by how she was doing but when I checked her inflammatory markers they had become more elevated.  She has been seen by Dr. Maureen Ralphs and recounts him removing fluid from her knee and sending this for analysis and culture.  However when I went into "[facility] portal the notes indicated that fluid cannot be withdrawn.  Dr. Maureen Ralphs also mentioned doing an MRI of the knee prior to considering reimplanting a new knee.    Did obtain MRI of the knee on December 28, 2019 and this was read as showing:   Antibiotic laden cement with some postsurgical changes in the distal femur and proximal tibia there was some irregular enhancement on the margins of the tracks that were felt by radiology possibly concerning for osteomyelitis versus residual postsurgical changes.  I  personally reviewed  these films  I alerted Dr. Maureen Ralphs re this MRI and he also reviewed himself.  He felt the changes were more consistent with abrasive changes that can happen in the context of chronic antibiotic cement being in place he was less concerned by the radiographic appearance for possible osteomyelitis.  We both continue to be concerned about her elevated inflammatory markers.  She does have a history of gout though it has not flared all in recent times she is really not on any medicines for gout at  present.  See menow 4 months after her MRI.  Knee pain was stable largely occurs at night or if she shifts around and bears weight on it.  Inflammatory markers were persistently abnormal.  Obtained another MRI of the knee in late July without contrast I believe she could not tolerate the procedure very well and this showed: T2 hyperintensity within the marrow cavity of the distal femur and proximal tibia remains concerning for chronic osteomyelitis post removal of total knee arthroplasty. No progressive bone destruction was seen.  Continues have pain in her knee more medially and was tender when I palpated that aspect of her knee joint.          Past Medical History:  Diagnosis Date  . Anemia   . Arthritis    Knee both knees  . Blood transfusion without reported diagnosis 2012   anemia;pt denies transfusion stated was only on iron tablet  . Cataract    left  . CKD (chronic kidney disease), stage III   . CVA (cerebral infarction)    2006  . Diabetes mellitus without complication ([place])   . Family history of anesthesia complication    sister very slow to awaken after anesthesia;severe vomiting   . Gout    left elbow  . Groin pain 06/21/2019  . Hamstring tendinitis 08/22/2019  . Heart murmur   . 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 08-09-14 Right eye  . History of gout 01/04/2019  . Hyperlipidemia   . Hypertension   . Nocturia    3-4 times per night  . 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 Osteomyelitis of right tibia ([place]) 07/23/2020  . Other acute osteomyelitis, right femur ([place]) 07/23/2020  . 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 Stroke [facility]) 2006   x 1 no deficits noted     Past Surgical History:  Procedure Laterality Date  . ABDOMINAL HYSTERECTOMY  1983  . CARPAL TUNNEL RELEASE Right 1983  . colonscopy  June 21, 2014  . EXCISIONAL TOTAL KNEE ARTHROPLASTY WITH ANTIBIOTIC SPACERS Right 02/16/2015   Procedure: RIGHT KNEE RESECTION  ARTHROPLASTY WITH ANTIBIOTIC SPACERS;  Surgeon: [name], MD;  Location: [facility];  Service: Orthopedics;  Laterality: Right;  . EXCISIONAL TOTAL KNEE ARTHROPLASTY WITH ANTIBIOTIC SPACERS Right 11/03/2018   Procedure: Right knee resection arthroplasty; antibiotic spacer;  Surgeon: [name], MD;  Location: [facility];  Service: Orthopedics;  Laterality: Right;  Adductor Block  . I & D KNEE WITH POLY EXCHANGE Right 10/02/2014   Procedure: IRRIGATION AND DEBRIDEMENT RIGHT KNEE WITH POLY EXCHANGE;  Surgeon: Gearlean Alf, MD;  Location: [facility];  Service: Orthopedics;  Laterality: Right;  . INCISION AND DRAINAGE OF WOUND Right 01/14/2017   Procedure: IRRIGATION AND DEBRIDEMENT WOUND;  Surgeon: [name], MD;  Location: [facility];  Service: Orthopedics;  Laterality: Right;  requests 72mns  . JOINT REPLACEMENT  06/2014   right knee  . nasal cauterization  2012  . PATELLAR TENDON  REPAIR Right 08/11/2014   Procedure: RIGHT PATELLA TENDON REPAIR;  Surgeon: Gearlean Alf, MD;  Location: [facility];  Service: Orthopedics;  Laterality: Right;  . REIMPLANTATION OF TOTAL KNEE Right 05/23/2015   Procedure: RIGHT KNEE ARTHROPLASTY REIMPLANTATION;  Surgeon: [name], MD;  Location: [facility];  Service: Orthopedics;  Laterality: Right;  . TOTAL KNEE ARTHROPLASTY Right 07/03/2014   Procedure: RIGHT TOTAL KNEE ARTHROPLASTY;  Surgeon: Gearlean Alf, MD;  Location: [facility];  Service: Orthopedics;  Laterality: Right;  . TUBAL LIGATION      Family History  Problem Relation Age of Onset  . Ovarian cancer Mother   . Cancer Mother   . Diabetes Son   . Diabetes Son   . Peripheral vascular disease Father        with amputation of both legs  . Hypertension Father   . Heart disease Brother   . Kidney disease Daughter   . Colon cancer Neg Hx   . Esophageal cancer Neg Hx   . Stomach cancer Neg Hx   . Rectal cancer Neg Hx       Social History   Socioeconomic History  . Marital status: Widowed    Spouse  name: Not on file  . Number of children: 9  . Years of education: 8th  . Highest education level: 8th grade  Occupational History    Employer: RETIRED  Tobacco Use  . Smoking status: Never Smoker  . Smokeless tobacco: Never Used  Vaping Use  . Vaping Use: Never used  Substance and Sexual Activity  . Alcohol use: No  . Drug use: No  . Sexual activity: Not Currently  Other Topics Concern  . Not on file  Social History Narrative   She has 6 living adult children and 3 who have passed away and many grandchildren and great grandchildren. Patient is retired. 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    Social Determinants of Health   Financial Resource Strain:   . Difficulty of Paying Living Expenses:   Food Insecurity:   . Worried About Charity fundraiser in the Last Year:   . Arboriculturist in the Last Year:   Transportation Needs:   . Film/video editor (Medical):   Marland Kitchen Lack of Transportation (Non-Medical):   Physical Activity:   . Days of Exercise per Week:   . Minutes of Exercise per Session:   Stress:   . Feeling of Stress :   Social Connections:   . Frequency of Communication with Friends and Family:   . Frequency of Social Gatherings with Friends and Family:   . Attends Religious Services:   . Active Member of Clubs or Organizations:   . Attends Archivist Meetings:   Marland Kitchen Marital Status:     Allergies  Allergen Reactions  . Aleve [Naproxen Sodium] Other (See Comments)    Heart races  . Asa [Aspirin] Other (See Comments)    Nose bleeding  . Ciprofloxacin Other (See Comments)    Possible hamstring tendinopathy  . Clonidine Derivatives Other (See Comments)    Dizziness and weakness  . Shellfish Allergy Nausea And Vomiting     Current Outpatient Medications:  .  acetaminophen (TYLENOL) 500 MG tablet, Take 2 tablets (1,000 mg total) by mouth every 6 (six) hours as needed for mild pain., Disp: 90 tablet, Rfl: 1 .  Alcohol Swabs  (B-D SINGLE USE SWABS REGULAR) PADS, Use to check glucose up to four times  daily Dx E11.9, Disp: 400 each, Rfl: 3 .  amLODipine (NORVASC) 10 MG tablet, TAKE 1 TABLET DAILY IN THE EVENING, Disp: 90 tablet, Rfl: 1 .  atorvastatin (LIPITOR) 20 MG tablet, TAKE 1 TABLET DAILY IN THE EVENING, Disp: 90 tablet, Rfl: 0 .  Blood Glucose Calibration (TRUE METRIX LEVEL 1) Low SOLN, Use with glucose monitor Dx E11.9, Disp: 3 each, Rfl: 0 .  Blood Glucose Monitoring Suppl (TRUE METRIX AIR GLUCOSE METER) w/Device KIT, 1 Device by Does not apply route 4 (four) times daily. Dx E11.9, Disp: 1 kit, Rfl: 0 .  cholecalciferol (VITAMIN D3) 25 MCG (1000 UT) tablet, Take 1,000 Units by mouth daily., Disp: , Rfl:  .  Cod Liver Oil CAPS, Take 1 capsule by mouth daily., Disp: , Rfl:  .  diltiazem (CARDIZEM CD) 240 MG 24 hr capsule, TAKE 1 CAPSULE EVERY DAY, Disp: 90 capsule, Rfl: 0 .  ferrous sulfate 325 (65 FE) MG tablet, Take 325 mg by mouth daily., Disp: , Rfl:  .  fexofenadine (ALLEGRA) 180 MG tablet, Take 1 tablet (180 mg total) by mouth daily. For allergy symptoms, Disp: 30 tablet, Rfl: 11 .  furosemide (LASIX) 20 MG tablet, Take 1 tablet (20 mg total) by mouth daily. (Patient not taking: Reported on 03/29/2020), Disp: 30 tablet, Rfl: 5 .  glucose blood (TRUE METRIX BLOOD GLUCOSE TEST) test strip, Use to check glucose up to four times daily, Disp: 400 each, Rfl: 3 .  hydrALAZINE (APRESOLINE) 100 MG tablet, TAKE ONE (1) TABLET THREE (3) TIMES EACH DAY, Disp: 270 tablet, Rfl: 0 .  HYDROcodone-acetaminophen (NORCO/VICODIN) 5-325 MG tablet, Take 1 tablet by mouth every 6 (six) hours as needed. (Patient taking differently: Take 1 tablet by mouth every 6 (six) hours as needed for moderate pain or severe pain. ), Disp: 14 tablet, Rfl: 0 .  isosorbide dinitrate (ISORDIL) 10 MG tablet, TAKE 1 TABLET THREE TIMES DAILY, Disp: 270 tablet, Rfl: 3 .  metFORMIN (GLUCOPHAGE-XR) 500 MG 24 hr tablet, TAKE 1 TABLET EVERY DAY, Disp: 90  tablet, Rfl: 0 .  Nebivolol HCl (BYSTOLIC) 20 MG TABS, TAKE 1 TABLET EVERY DAY, Disp: 90 tablet, Rfl: 0 .  olmesartan (BENICAR) 40 MG tablet, TAKE ONE (1) TABLET EACH DAY, Disp: 90 tablet, Rfl: 1 .  TRADJENTA 5 MG TABS tablet, TAKE 1 TABLET EVERY DAY, Disp: 90 tablet, Rfl: 0 .  TRUEplus Lancets 33G MISC, Test QID Dx E11.9, Disp: 400 each, Rfl: 3 No current facility-administered medications for this visit.  Facility-Administered Medications Ordered in Other Visits:  .  tranexamic acid (CYKLOKAPRON) 2,000 mg in sodium chloride 0.9 % 50 mL Topical Application, 0,938 mg, Topical, Once, Aluisio, Frank, MD  Review of Systems  Constitutional: Negative for activity change, appetite change, chills, diaphoresis, fatigue, fever and unexpected weight change.  HENT: Negative for congestion, rhinorrhea, sinus pressure, sneezing, sore throat and trouble swallowing.   Eyes: Negative for photophobia and visual disturbance.  Respiratory: Negative for cough, chest tightness, shortness of breath, wheezing and stridor.   Cardiovascular: Negative for chest pain, palpitations and leg swelling.  Gastrointestinal: Negative for abdominal distention, abdominal pain, anal bleeding, blood in stool, constipation, diarrhea, nausea and vomiting.  Genitourinary: Negative for difficulty urinating, dysuria, flank pain and hematuria.  Musculoskeletal: Positive for arthralgias and joint swelling. Negative for back pain and myalgias.  Skin: Negative for color change, pallor and rash.  Neurological: Negative for dizziness, tremors, weakness and light-headedness.  Hematological: Negative for adenopathy. Does not bruise/bleed easily.  Psychiatric/Behavioral: Negative  for agitation, behavioral problems, confusion, decreased concentration, dysphoric mood and sleep disturbance. The patient is not nervous/anxious.        Objective:   Physical Exam Constitutional:      General: She is not in acute distress.    Appearance: She is  not diaphoretic.  HENT:     Head: Normocephalic and atraumatic.     Right Ear: External ear normal.     Left Ear: External ear normal.     Nose: Nose normal.  Eyes:     General: No scleral icterus.    Extraocular Movements: Extraocular movements intact.     Conjunctiva/sclera: Conjunctivae normal.  Cardiovascular:     Rate and Rhythm: Normal rate and regular rhythm.  Pulmonary:     Effort: Pulmonary effort is normal. No respiratory distress.  Abdominal:     General: There is no distension.  Musculoskeletal:     Cervical back: Normal range of motion and neck supple.  Lymphadenopathy:     Cervical: No cervical adenopathy.  Skin:    General: Skin is warm and dry.     Coloration: Skin is not pale.     Findings: No erythema or rash.  Neurological:     General: No focal deficit present.     Mental Status: She is alert and oriented to person, place, and time.     Coordination: Coordination normal.  Psychiatric:        Attention and Perception: Attention normal.        Mood and Affect: Mood normal. Mood is not anxious.        Behavior: Behavior normal.        Thought Content: Thought content normal.        Judgment: Judgment normal.   Right knee 07/23/2020:        Assessment & Plan:    Right prosthetic joint infection status post multiple surgeries now status post explantation of prosthesis implantation of antibiotics beads and spacer along with 6 weeks of systemic antibiotics followed by months of ciprofloxacin  Now been off antibiotics for > year  Given the MRI findings in January 21 and MRI late July concerning for osteomyelitis in the distal femur and proximal tibia.  There is no progressive pathology but concern for osteomyelitis remains.  The persistently elevated inflammatory markers along with the MRI findings make me concerned that she has persistent infection and the joint and bone.  I will discuss with Dr. Maureen Ralphs and she will see me in a month  She and her  daughter state that in the past patient went for a 2nd opinion and was recommended to undergo AKA  I am reluctant to simply restart ciprofloxaxin esp if it might cause tendinopathy again, C. difficile colitis.  Also could obfuscated any cultures taken in the operating room.  Finally if we were to restart it we would be likely continue it indefinitely  I spent greater than 40 minutes with the patient including greater than 50% of time in face to face counsel of the patient and daughter re the nature of PJI and osteomyelitis and in coordination of her care.  Covid vaccination she has been vaccinated for COVID-19

## 2020-07-24 LAB — BASIC METABOLIC PANEL WITH GFR
BUN/Creatinine Ratio: 21 (calc) (ref 6–22)
BUN: 25 mg/dL (ref 7–25)
CO2: 26 mmol/L (ref 20–32)
Calcium: 9.8 mg/dL (ref 8.6–10.4)
Chloride: 102 mmol/L (ref 98–110)
Creat: 1.19 mg/dL — ABNORMAL HIGH (ref 0.60–0.93)
GFR, Est African American: 51 mL/min/{1.73_m2} — ABNORMAL LOW (ref >=60)
GFR, Est Non African American: 44 mL/min/{1.73_m2} — ABNORMAL LOW (ref >=60)
Glucose, Bld: 93 mg/dL (ref 65–99)
Potassium: 4.3 mmol/L (ref 3.5–5.3)
Sodium: 137 mmol/L (ref 135–146)

## 2020-07-24 LAB — CBC WITH DIFFERENTIAL/PLATELET
Absolute Monocytes: 593 cells/uL (ref 200–950)
Basophils Absolute: 53 cells/uL (ref 0–200)
Basophils Relative: 0.7 %
Eosinophils Absolute: 752 cells/uL — ABNORMAL HIGH (ref 15–500)
Eosinophils Relative: 9.9 %
HCT: 38.4 % (ref 35.0–45.0)
Hemoglobin: 12 g/dL (ref 11.7–15.5)
Lymphs Abs: 1512 cells/uL (ref 850–3900)
MCH: 25.5 pg — ABNORMAL LOW (ref 27.0–33.0)
MCHC: 31.3 g/dL — ABNORMAL LOW (ref 32.0–36.0)
MCV: 81.7 fL (ref 80.0–100.0)
MPV: 10.3 fL (ref 7.5–12.5)
Monocytes Relative: 7.8 %
Neutro Abs: 4689 cells/uL (ref 1500–7800)
Neutrophils Relative %: 61.7 %
Platelets: 344 10*3/uL (ref 140–400)
RBC: 4.7 10*6/uL (ref 3.80–5.10)
RDW: 14.2 % (ref 11.0–15.0)
Total Lymphocyte: 19.9 %
WBC: 7.6 10*3/uL (ref 3.8–10.8)

## 2020-07-24 LAB — SEDIMENTATION RATE: Sed Rate: 51 mm/h — ABNORMAL HIGH (ref 0–30)

## 2020-07-24 LAB — C-REACTIVE PROTEIN: CRP: 3.7 mg/L (ref <8.0)

## 2020-07-25 ENCOUNTER — Other Ambulatory Visit: Payer: Self-pay | Admitting: Family Medicine

## 2020-07-26 NOTE — Telephone Encounter (Signed)
Mail in refills denied, pt was to follow up in July 2021 and no follow up appt scheduled.   Needs appt

## 2020-07-30 ENCOUNTER — Other Ambulatory Visit: Payer: Self-pay | Admitting: Family Medicine

## 2020-07-31 MED ORDER — LINAGLIPTIN 5 MG PO TABS: 5.0000 mg | ORAL_TABLET | Freq: Every day | ORAL | 0 refills | Status: DC

## 2020-07-31 MED ORDER — BYSTOLIC 20 MG PO TABS: ORAL_TABLET | ORAL | 0 refills | Status: DC

## 2020-07-31 NOTE — Telephone Encounter (Signed)
Pt scheduled for 08/16/20 at 3:55. Refills sent to pharmacy.

## 2020-08-01 DIAGNOSIS — Z471 Aftercare following joint replacement surgery: Secondary | ICD-10-CM | POA: Diagnosis not present

## 2020-08-01 DIAGNOSIS — Z96651 Presence of right artificial knee joint: Secondary | ICD-10-CM | POA: Diagnosis not present

## 2020-08-09 DIAGNOSIS — Z96651 Presence of right artificial knee joint: Secondary | ICD-10-CM | POA: Diagnosis not present

## 2020-08-16 ENCOUNTER — Other Ambulatory Visit: Payer: Self-pay

## 2020-08-16 ENCOUNTER — Encounter: Payer: Self-pay | Admitting: Family Medicine

## 2020-08-16 ENCOUNTER — Ambulatory Visit (INDEPENDENT_AMBULATORY_CARE_PROVIDER_SITE_OTHER): Payer: Medicare HMO | Admitting: Family Medicine

## 2020-08-16 VITALS — BP 140/73 | HR 62 | Temp 97.3°F

## 2020-08-16 DIAGNOSIS — E119 Type 2 diabetes mellitus without complications: Secondary | ICD-10-CM

## 2020-08-16 DIAGNOSIS — E782 Mixed hyperlipidemia: Secondary | ICD-10-CM

## 2020-08-16 DIAGNOSIS — I10 Essential (primary) hypertension: Secondary | ICD-10-CM | POA: Diagnosis not present

## 2020-08-16 LAB — BAYER DCA HB A1C WAIVED: HB A1C (BAYER DCA - WAIVED): 5.4 % (ref <7.0)

## 2020-08-16 NOTE — Progress Notes (Signed)
Subjective:  Patient ID: Beth [name],  female    DOB: [birth year]  Age: 74 y.o.    CC: Follow-up (Chronic)   HPI Beth [name] presents for  follow-up of hypertension. Patient has no history of headache chest pain or shortness of breath or recent cough. Patient also denies symptoms of TIA such as numbness weakness lateralizing. Patient denies side effects from medication. States taking it regularly.  Patient also  in for follow-up of elevated cholesterol. Doing well without complaints on current medication. Denies side effects  including myalgia and arthralgia and nausea. Also in today for liver function testing. Currently no chest pain, shortness of breath or other cardiovascular related symptoms noted.  Follow-up of diabetes. Patient does not check blood sugar at home. Patient denies symptoms such as excessive hunger or urinary frequency, excessive hunger, nausea No significant hypoglycemic spells noted. Medications reviewed. Pt reports taking them regularly. Pt. denies complication/adverse reaction today.    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   She has a past surgical history that includes Carpal tunnel release (Right, 1983); colonscopy (June 21, 2014); nasal cauterization (2012); Total knee arthroplasty (Right, 07/03/2014); Patellar tendon repair  (Right, 08/11/2014); Joint replacement (06/2014); I & D knee with poly exchange (Right, 10/02/2014); Excisional total knee arthroplasty with antibiotic spacers (Right, 02/16/2015); Reimplantation of total knee (Right, 05/23/2015); Abdominal hysterectomy (1983); Tubal ligation; Incision and drainage of wound (Right, 01/14/2017); and Excisional total knee arthroplasty with antibiotic spacers (Right, 11/03/2018).   Her family history includes Cancer in her mother; Diabetes in her son and son; Heart disease in her brother; Hypertension in her father; Kidney disease in her daughter; Ovarian cancer in her mother; Peripheral vascular disease in her father.She reports that she has never smoked. She has never used smokeless tobacco. She reports that she does not drink alcohol and does not use drugs.  Current Outpatient Medications on File Prior to Visit  Medication Sig Dispense Refill  . acetaminophen (TYLENOL) 500 MG tablet Take 2 tablets (1,000 mg total) by mouth every 6 (six) hours as needed for mild pain. 90 tablet 1  . Alcohol Swabs (B-D SINGLE USE SWABS REGULAR) PADS Use to check glucose up to four times daily Dx E11.9 400 each 3  . amLODipine (NORVASC) 10 MG tablet TAKE 1 TABLET DAILY IN THE EVENING (Patient taking differently: Take 10 mg by mouth daily. ) 90 tablet 1  . Ascorbic Acid (SUPER C COMPLEX PO) Take 1 tablet by mouth daily.    Marland Kitchen atorvastatin (LIPITOR) 20 MG tablet TAKE 1 TABLET DAILY IN THE EVENING (Patient taking differently: Take 20 mg by mouth daily. ) 90 tablet 0  . Blood Glucose Calibration (TRUE METRIX LEVEL 1) Low SOLN Use with glucose monitor Dx E11.9 3 each 0  . Blood Glucose Monitoring Suppl (TRUE METRIX AIR GLUCOSE METER) w/Device KIT 1 Device by Does not apply route 4 (four) times daily. Dx E11.9 1 kit 0  . COD LIVER OIL PO Take 450 mg by mouth daily.    Marland Kitchen diltiazem (CARDIZEM CD) 240 MG  24 hr capsule TAKE 1 CAPSULE EVERY DAY (Patient taking differently: Take 240 mg by mouth daily. ) 90  capsule 0  . glucose blood (TRUE METRIX BLOOD GLUCOSE TEST) test strip Use to check glucose up to four times daily 400 each 3  . hydrALAZINE (APRESOLINE) 100 MG tablet TAKE ONE (1) TABLET THREE (3) TIMES EACH DAY (Patient taking differently: Take 100 mg by mouth daily. ) 270 tablet 0  . isosorbide dinitrate (ISORDIL) 10 MG tablet TAKE 1 TABLET THREE TIMES DAILY (Patient taking differently: Take 10 mg by mouth 2 (two) times daily. ) 270 tablet 3  . linagliptin (TRADJENTA) 5 MG TABS tablet Take 1 tablet (5 mg total) by mouth daily. 90 tablet 0  . metFORMIN (GLUCOPHAGE-XR) 500 MG 24 hr tablet TAKE 1 TABLET EVERY DAY (Patient taking differently: Take 500 mg by mouth daily with breakfast. ) 90 tablet 0  . Nebivolol HCl (BYSTOLIC) 20 MG TABS TAKE 1 TABLET EVERY DAY (Patient taking differently: Take 20 mg by mouth daily. ) 90 tablet 0  . olmesartan (BENICAR) 40 MG tablet TAKE ONE (1) TABLET EACH DAY (Patient taking differently: Take 40 mg by mouth daily. ) 90 tablet 1  . TRUEplus Lancets 33G MISC Test QID Dx E11.9 400 each 3  . ferrous sulfate 325 (65 FE) MG tablet Take 325 mg by mouth daily.  (Patient not taking: Reported on 08/16/2020)    . fexofenadine (ALLEGRA) 180 MG tablet Take 1 tablet (180 mg total) by mouth daily. For allergy symptoms (Patient not taking: Reported on 08/16/2020) 30 tablet 11   Current Facility-Administered Medications on File Prior to Visit  Medication Dose Route Frequency Provider Last Rate Last Admin  . tranexamic acid (CYKLOKAPRON) 2,000 mg in sodium chloride 0.9 % 50 mL Topical Application  9,702 mg Topical Once [name], MD        ROS Review of Systems  Constitutional: Negative.   HENT: Negative for congestion.   Eyes: Negative for visual disturbance.  Respiratory: Negative for shortness of breath.   Cardiovascular: Negative for chest pain.  Gastrointestinal: Negative for abdominal pain, constipation, diarrhea, nausea and vomiting.  Genitourinary: Negative for  difficulty urinating.  Musculoskeletal: Negative for arthralgias and myalgias.  Neurological: Negative for headaches.  Psychiatric/Behavioral: Negative for sleep disturbance.    Objective:  BP 140/73   Pulse 62   Temp (!) 97.3 F (36.3 C) (Temporal)   BP Readings from Last 3 Encounters:  08/21/20 (!) 158/82  08/16/20 140/73  07/23/20 136/77    Wt Readings from Last 3 Encounters:  08/21/20 214 lb (97.1 kg)  04/02/20 215 lb (97.5 kg)  12/11/19 210 lb (95.3 kg)     Physical Exam Constitutional:      General: She is not in acute distress.    Appearance: She is well-developed.  HENT:     Head: Normocephalic and atraumatic.     Right Ear: External ear normal.     Left Ear: External ear normal.     Nose: Nose normal.  Eyes:     Conjunctiva/sclera: Conjunctivae normal.     Pupils: Pupils are equal, round, and reactive to light.  Neck:     Thyroid: No thyromegaly.  Cardiovascular:     Rate and Rhythm: Normal rate and regular rhythm.     Heart sounds: Normal heart sounds. No murmur heard.   Pulmonary:     Effort: Pulmonary effort is normal. No respiratory distress.     Breath sounds: Normal breath sounds. No wheezing  or rales.  Abdominal:     General: Bowel sounds are normal. There is no distension.     Palpations: Abdomen is soft.     Tenderness: There is no abdominal tenderness.  Musculoskeletal:     Cervical back: Normal range of motion and neck supple.  Lymphadenopathy:     Cervical: No cervical adenopathy.  Skin:    General: Skin is warm and dry.  Neurological:     Mental Status: She is alert and oriented to person, place, and time.     Deep Tendon Reflexes: Reflexes are normal and symmetric.  Psychiatric:        Behavior: Behavior normal.        Thought Content: Thought content normal.        Judgment: Judgment normal.     Diabetic Foot Exam - Simple   No data filed        Assessment & Plan:   Lakecia was seen today for follow-up.  Diagnoses and  all orders for this visit:  Controlled type 2 diabetes mellitus without complication, without long-term current use of insulin (HCC) -     CBC with Differential/Platelet -     CMP14+EGFR -     Bayer DCA Hb A1c Waived  Mixed hyperlipidemia -     CBC with Differential/Platelet -     CMP14+EGFR -     Bayer DCA Hb A1c Waived  Essential hypertension -     CBC with Differential/Platelet -     CMP14+EGFR -     Bayer DCA Hb A1c Waived   I am having Maryann Alar. Perdomo maintain her acetaminophen, ferrous sulfate, B-D SINGLE USE SWABS REGULAR, True Metrix Air Glucose Meter, True Metrix Blood Glucose Test, True Metrix Level 1, TRUEplus Lancets 33G, fexofenadine, hydrALAZINE, olmesartan, amLODipine, metFORMIN, diltiazem, atorvastatin, isosorbide dinitrate, linagliptin, Bystolic, COD LIVER OIL PO, and Ascorbic Acid (SUPER C COMPLEX PO).  No orders of the defined types were placed in this encounter.    Follow-up: Return in about 3 months (around 11/15/2020).  Claretta Fraise, M.D.

## 2020-08-17 LAB — CMP14+EGFR
ALT: 8 IU/L (ref 0–32)
AST: 10 IU/L (ref 0–40)
Albumin/Globulin Ratio: 1.4 (ref 1.2–2.2)
Albumin: 4.4 g/dL (ref 3.7–4.7)
Alkaline Phosphatase: 141 IU/L — ABNORMAL HIGH (ref 48–121)
BUN/Creatinine Ratio: 21 (ref 12–28)
BUN: 27 mg/dL (ref 8–27)
Bilirubin Total: 0.5 mg/dL (ref 0.0–1.2)
CO2: 24 mmol/L (ref 20–29)
Calcium: 9.5 mg/dL (ref 8.7–10.3)
Chloride: 102 mmol/L (ref 96–106)
Creatinine, Ser: 1.26 mg/dL — ABNORMAL HIGH (ref 0.57–1.00)
GFR calc Af Amer: 48 mL/min/{1.73_m2} — ABNORMAL LOW (ref >59)
GFR calc non Af Amer: 41 mL/min/{1.73_m2} — ABNORMAL LOW (ref >59)
Globulin, Total: 3.2 g/dL (ref 1.5–4.5)
Glucose: 83 mg/dL (ref 65–99)
Potassium: 4.5 mmol/L (ref 3.5–5.2)
Sodium: 140 mmol/L (ref 134–144)
Total Protein: 7.6 g/dL (ref 6.0–8.5)

## 2020-08-17 LAB — CBC WITH DIFFERENTIAL/PLATELET
Basophils Absolute: 0.1 10*3/uL (ref 0.0–0.2)
Basos: 1 %
EOS (ABSOLUTE): 0.5 10*3/uL — ABNORMAL HIGH (ref 0.0–0.4)
Eos: 7 %
Hematocrit: 38 % (ref 34.0–46.6)
Hemoglobin: 12.1 g/dL (ref 11.1–15.9)
Immature Grans (Abs): 0 10*3/uL (ref 0.0–0.1)
Immature Granulocytes: 0 %
Lymphocytes Absolute: 1.4 10*3/uL (ref 0.7–3.1)
Lymphs: 19 %
MCH: 25.9 pg — ABNORMAL LOW (ref 26.6–33.0)
MCHC: 31.8 g/dL (ref 31.5–35.7)
MCV: 81 fL (ref 79–97)
Monocytes Absolute: 0.8 10*3/uL (ref 0.1–0.9)
Monocytes: 10 %
Neutrophils Absolute: 5 10*3/uL (ref 1.4–7.0)
Neutrophils: 63 %
Platelets: 334 10*3/uL (ref 150–450)
RBC: 4.68 x10E6/uL (ref 3.77–5.28)
RDW: 14.9 % (ref 11.7–15.4)
WBC: 7.8 10*3/uL (ref 3.4–10.8)

## 2020-08-20 NOTE — H&P (Signed)
ADMISSION H&P  Patient is being admitted for irrigation and debridement with spacer exchange, right knee.   Subjective:  Chief Complaint: Chronic right knee infection  HPI: Beth [name], 74 y.o. female with PMH significant for diabetes with a history of right knee resection arthroplasty with placement of an antibiotic spacer in 2019. She is also being followed by infectious disease specialist Dr. Tommy Medal. She has had continued issues with the knee including increased pain. Inflammatory markers have continued to downtrend and recent MRI from 06/2020 was unchanged. She was recently seen by Dr. Wynelle Link in our clinic on 08/09/2020 where it was decided to perform an irrigation and debridement with spacer exchange. She presents today for surgery.    Patient Active Problem List   Diagnosis Date Noted  . Osteomyelitis of right tibia ([place]) 07/23/2020  . Other acute osteomyelitis, right femur ([place]) 07/23/2020  . Hamstring tendinitis of right thigh 08/22/2019  . 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 Right groin pain 06/21/2019  . History of gout 01/04/2019  . 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 Diabetes type 2, controlled ([place]) 03/07/2016  . Gout   . Essential hypertension   . Enuresis 11/19/2015  . HLD (hyperlipidemia) 01/04/2014  . Anemia, iron deficiency 12/30/2011  . Constipation 04/04/2009  . TUBULOVILLOUS ADENOMA, COLON 04/03/2009    Past Medical History:  Diagnosis Date  . Anemia   . Arthritis    Knee both knees  . Blood transfusion without reported diagnosis 2012   anemia;pt denies transfusion stated was only on iron tablet  . Cataract    left  . CKD (chronic kidney disease), stage III   . CVA (cerebral infarction)    2006  . Diabetes mellitus without complication ([place])   . Family history of anesthesia complication    sister very slow to awaken after anesthesia;severe vomiting   . Gout     left elbow  . Groin pain 06/21/2019  . Hamstring tendinitis 08/22/2019  . Heart murmur   . 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 08-09-14 Right eye  . History of gout 01/04/2019  . Hyperlipidemia   . Hypertension   . Nocturia    3-4 times per night  . 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 Osteomyelitis of right tibia ([place]) 07/23/2020  . Other acute osteomyelitis, right femur ([place]) 07/23/2020  . 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 Stroke [facility]) 2006   x 1 no deficits noted     Past Surgical History:  Procedure Laterality Date  . ABDOMINAL HYSTERECTOMY  1983  . CARPAL TUNNEL RELEASE Right 1983  . colonscopy  June 21, 2014  . EXCISIONAL TOTAL KNEE ARTHROPLASTY WITH ANTIBIOTIC SPACERS Right 02/16/2015   Procedure: RIGHT KNEE RESECTION ARTHROPLASTY WITH ANTIBIOTIC SPACERS;  Surgeon: [name], MD;  Location: [facility];  Service: Orthopedics;  Laterality: Right;  . EXCISIONAL TOTAL KNEE ARTHROPLASTY WITH ANTIBIOTIC SPACERS Right 11/03/2018   Procedure: Right knee resection arthroplasty; antibiotic spacer;  Surgeon: [name], MD;  Location: [facility];  Service: Orthopedics;  Laterality: Right;  Adductor Block  . I & D KNEE WITH POLY EXCHANGE Right 10/02/2014   Procedure: IRRIGATION AND DEBRIDEMENT RIGHT KNEE WITH POLY EXCHANGE;  Surgeon: Gearlean Alf, MD;  Location: [facility];  Service: Orthopedics;  Laterality: Right;  . INCISION AND DRAINAGE OF WOUND Right 01/14/2017   Procedure: IRRIGATION AND DEBRIDEMENT WOUND;  Surgeon: [name], MD;  Location: [facility];  Service: Orthopedics;  Laterality: Right;  requests 50mns  . JOINT REPLACEMENT  06/2014   right knee  . nasal cauterization  2012  . PATELLAR TENDON REPAIR Right 08/11/2014   Procedure: RIGHT PATELLA TENDON REPAIR;  Surgeon: FGearlean Alf MD;  Location: [facility];  Service: Orthopedics;  Laterality: Right;  . REIMPLANTATION OF TOTAL KNEE Right 05/23/2015   Procedure: RIGHT KNEE ARTHROPLASTY  REIMPLANTATION;  Surgeon: FGaynelle Arabian MD;  Location: [facility];  Service: Orthopedics;  Laterality: Right;  . TOTAL KNEE ARTHROPLASTY Right 07/03/2014   Procedure: RIGHT TOTAL KNEE ARTHROPLASTY;  Surgeon: FGearlean Alf MD;  Location: [facility];  Service: Orthopedics;  Laterality: Right;  . TUBAL LIGATION      Prior to Admission medications   Medication Sig Start Date End Date Taking? Authorizing Provider  acetaminophen (TYLENOL) 500 MG tablet Take 2 tablets (1,000 mg total) by mouth every 6 (six) hours as needed for mild pain. 09/28/18  Yes Stacks, WCletus Gash MD  amLODipine (NORVASC) 10 MG tablet TAKE 1 TABLET DAILY IN THE EVENING Patient taking differently: Take 10 mg by mouth daily.  04/04/20  Yes Stacks, WCletus Gash MD  Ascorbic Acid (SUPER C COMPLEX PO) Take 1 tablet by mouth daily.   Yes [provider]  atorvastatin (LIPITOR) 20 MG tablet TAKE 1 TABLET DAILY IN THE EVENING Patient taking differently: Take 20 mg by mouth daily.  07/04/20  Yes SClaretta Fraise MD  COD LIVER OIL PO Take 450 mg by mouth daily.   Yes [provider]  diltiazem (CARDIZEM CD) 240 MG 24 hr capsule TAKE 1 CAPSULE EVERY DAY Patient taking differently: Take 240 mg by mouth daily.  05/22/20  Yes Stacks, WCletus Gash MD  fexofenadine (ALLEGRA) 180 MG tablet Take 1 tablet (180 mg total) by mouth daily. For allergy symptoms Patient not taking: Reported on 08/16/2020 03/29/20  Yes Stacks, WCletus Gash MD  hydrALAZINE (APRESOLINE) 100 MG tablet TAKE ONE (1) TABLET THREE (3) TGliddenPatient taking differently: Take 100 mg by mouth daily.  04/04/20  Yes Stacks, WCletus Gash MD  isosorbide dinitrate (ISORDIL) 10 MG tablet TAKE 1 TABLET THREE TIMES DAILY Patient taking differently: Take 10 mg by mouth 2 (two) times daily.  07/20/20  Yes SClaretta Fraise MD  linagliptin (TRADJENTA) 5 MG TABS tablet Take 1 tablet (5 mg total) by mouth daily. 07/31/20  Yes SClaretta Fraise MD  metFORMIN (GLUCOPHAGE-XR) 500 MG 24 hr tablet TAKE 1 TABLET  EVERY DAY Patient taking differently: Take 500 mg by mouth daily with breakfast.  05/22/20  Yes Stacks, WCletus Gash MD  Nebivolol HCl (BYSTOLIC) 20 MG TABS TAKE 1 TABLET EVERY DAY Patient taking differently: Take 20 mg by mouth daily.  07/31/20  Yes Stacks, WCletus Gash MD  olmesartan (BENICAR) 40 MG tablet TAKE ONE (1) TABLET EACH DAY Patient taking differently: Take 40 mg by mouth daily.  04/04/20  Yes SClaretta Fraise MD  Alcohol Swabs (B-D SINGLE USE SWABS REGULAR) PADS Use to check glucose up to four times daily Dx E11.9 03/19/20   SClaretta Fraise MD  Blood Glucose Calibration (TRUE METRIX LEVEL 1) Low SOLN Use with glucose monitor Dx E11.9 03/19/20   SClaretta Fraise MD  Blood Glucose Monitoring Suppl (TRUE METRIX AIR GLUCOSE METER) w/Device KIT 1 Device by Does not apply route 4 (four) times daily. Dx E11.9 03/19/20   SClaretta Fraise MD  ferrous sulfate 325 (65 FE) MG tablet Take 325 mg by mouth daily.  Patient not taking: Reported on 08/16/2020  [provider]  glucose blood (TRUE METRIX BLOOD GLUCOSE TEST) test strip Use to check glucose up to four times daily 03/19/20   Claretta Fraise, MD  TRUEplus Lancets 33G MISC Test QID Dx E11.9 03/27/20   Claretta Fraise, MD    Allergies  Allergen Reactions  . Aleve [Naproxen Sodium] Other (See Comments)    Heart races  . Asa [Aspirin] Other (See Comments)    Nose bleeding  . Ciprofloxacin Other (See Comments)    Possible hamstring tendinopathy  . Clonidine Derivatives Other (See Comments)    Dizziness and weakness  . Shellfish Allergy Nausea And Vomiting    Social History   Socioeconomic History  . Marital status: Widowed    Spouse name: Not on file  . Number of children: 9  . Years of education: 8th  . Highest education level: 8th grade  Occupational History    Employer: RETIRED  Tobacco Use  . Smoking status: Never Smoker  . Smokeless tobacco: Never Used  Vaping Use  . Vaping Use: Never used  Substance and Sexual Activity  .  Alcohol use: No  . Drug use: No  . Sexual activity: Not Currently  Other Topics Concern  . Not on file  Social History Narrative   She has 6 living adult children and 3 who have passed away and many grandchildren and great grandchildren. Patient is retired. 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    Social Determinants of Health   Financial Resource Strain:   . Difficulty of Paying Living Expenses: Not on file  Food Insecurity:   . Worried About Charity fundraiser in the Last Year: Not on file  . Ran Out of Food in the Last Year: Not on file  Transportation Needs:   . Lack of Transportation (Medical): Not on file  . Lack of Transportation (Non-Medical): Not on file  Physical Activity:   . Days of Exercise per Week: Not on file  . Minutes of Exercise per Session: Not on file  Stress:   . Feeling of Stress : Not on file  Social Connections:   . Frequency of Communication with Friends and Family: Not on file  . Frequency of Social Gatherings with Friends and Family: Not on file  . Attends Religious Services: Not on file  . Active Member of Clubs or Organizations: Not on file  . Attends Archivist Meetings: Not on file  . Marital Status: Not on file  Intimate Partner Violence:   . Fear of Current or Ex-Partner: Not on file  . Emotionally Abused: Not on file  . Physically Abused: Not on file  . Sexually Abused: Not on file      Tobacco Use: Low Risk   . Smoking Tobacco Use: Never Smoker  . Smokeless Tobacco Use: Never Used   Social History   Substance and Sexual Activity  Alcohol Use No    Family History  Problem Relation Age of Onset  . Ovarian cancer Mother   . Cancer Mother   . Diabetes Son   . Diabetes Son   . Peripheral vascular disease Father        with amputation of both legs  . Hypertension Father   . Heart disease Brother   . Kidney disease Daughter   . Colon cancer Neg Hx   . Esophageal cancer Neg Hx   . Stomach cancer  Neg Hx   . Rectal cancer Neg Hx     Review  of Systems  Constitutional: Negative for chills and fever.  HENT: Negative for congestion, sore throat and tinnitus.   Eyes: Negative for double vision, photophobia and pain.  Respiratory: Negative for cough, shortness of breath and wheezing.   Cardiovascular: Negative for chest pain, palpitations and orthopnea.  Gastrointestinal: Negative for heartburn, nausea and vomiting.  Genitourinary: Negative for dysuria, frequency and urgency.  Musculoskeletal: Positive for joint pain.  Neurological: Negative for dizziness, weakness and headaches.    Objective:  Physical Exam: Right Knee Exam:  No warmth or effusion present. No swelling present.  Skin is well-healed. There are no evidence of any skin breakdown or sinuses.  Positive medial joint line tenderness.  No lateral joint line tenderness.  Imaging Review Radiographs- X-rays of the right knee dated 08/01/2020 demonstrate the spacer has dislodged from the tibial portion of this but the femoral portion remains intact.    These x-rays were ordered, reviewed, and interpreted independently and were discussed with the patient.    MRI of the right knee at [facility] dated 06/29/2020 demonstrates the spacer is dislodged, but I do not see any abscess. There is definitely bony erosion in the area where the spacer has extruded and that is probably abrasion from the spacer    A diagnosis of osteomyelitis definitely cannot be made based on the MRI or x-ray alone.  Assessment/Plan:  Right knee chronic infection  We discussed the patient's presenting complaints, history, and treatment options. I think the spacer has eroded the bone on the proximal tibia and that is what is causing her discomfort. Her sedimentation rate has been stable in a 50 to 60 range. The fact that the most recent was 26, which is down from 61 before. Her C-reactive protein has also been coming down. We need to definitively determine if it  is infection in there, as the sedimentation rate has stayed elevated and the MRI at least raises concern for possible osteomyelitis. Once again, I do not feel we can make a definitive diagnosis of this from an MRI with a spacer in place. I would recommend exchanging the spacer and getting multiple cultures of tissue and any abnormal bone present. I will also have to put a temporary rigid fixation in the medullary canal, whether that be cement and pins or an IM rod, just so the spacer does not extrude again.  Risks and benefits of the surgical procedure were discussed with the patient and she elects to proceed.  Theresa Duty, PA-C Orthopedic Surgery [facility] [place]

## 2020-08-21 ENCOUNTER — Other Ambulatory Visit: Payer: Self-pay

## 2020-08-21 ENCOUNTER — Encounter (HOSPITAL_COMMUNITY): Payer: Self-pay

## 2020-08-21 ENCOUNTER — Encounter (HOSPITAL_COMMUNITY)
Admission: RE | Admit: 2020-08-21 | Discharge: 2020-08-21 | Disposition: A | Discharge status: Home / Self Care | Payer: Medicare HMO | Source: Ambulatory Visit | Attending: Orthopedic Surgery | Admitting: Orthopedic Surgery

## 2020-08-21 DIAGNOSIS — Z79899 Other long term (current) drug therapy: Secondary | ICD-10-CM | POA: Insufficient documentation

## 2020-08-21 DIAGNOSIS — M00861 Arthritis due to other bacteria, right knee: Secondary | ICD-10-CM | POA: Diagnosis not present

## 2020-08-21 DIAGNOSIS — I129 Hypertensive chronic kidney disease with stage 1 through stage 4 chronic kidney disease, or unspecified chronic kidney disease: Secondary | ICD-10-CM | POA: Diagnosis not present

## 2020-08-21 DIAGNOSIS — Z7984 Long term (current) use of oral hypoglycemic drugs: Secondary | ICD-10-CM | POA: Insufficient documentation

## 2020-08-21 DIAGNOSIS — X58XXXA Exposure to other specified factors, initial encounter: Secondary | ICD-10-CM | POA: Diagnosis not present

## 2020-08-21 DIAGNOSIS — T8453XA Infection and inflammatory reaction due to internal right knee prosthesis, initial encounter: Secondary | ICD-10-CM | POA: Diagnosis not present

## 2020-08-21 DIAGNOSIS — Z8673 Personal history of transient ischemic attack (TIA), and cerebral infarction without residual deficits: Secondary | ICD-10-CM | POA: Diagnosis not present

## 2020-08-21 DIAGNOSIS — E1122 Type 2 diabetes mellitus with diabetic chronic kidney disease: Secondary | ICD-10-CM | POA: Diagnosis not present

## 2020-08-21 DIAGNOSIS — N183 Chronic kidney disease, stage 3 unspecified: Secondary | ICD-10-CM | POA: Insufficient documentation

## 2020-08-21 DIAGNOSIS — Z01812 Encounter for preprocedural laboratory examination: Secondary | ICD-10-CM | POA: Insufficient documentation

## 2020-08-21 LAB — COMPREHENSIVE METABOLIC PANEL
ALT: 11 U/L (ref 0–44)
AST: 16 U/L (ref 15–41)
Albumin: 3.8 g/dL (ref 3.5–5.0)
Alkaline Phosphatase: 98 U/L (ref 38–126)
Anion gap: 14 (ref 5–15)
BUN: 31 mg/dL — ABNORMAL HIGH (ref 8–23)
CO2: 24 mmol/L (ref 22–32)
Calcium: 9.3 mg/dL (ref 8.9–10.3)
Chloride: 104 mmol/L (ref 98–111)
Creatinine, Ser: 0.92 mg/dL (ref 0.44–1.00)
GFR calc Af Amer: >60 mL/min (ref >60)
GFR calc non Af Amer: >60 mL/min (ref >60)
Glucose, Bld: 89 mg/dL (ref 70–99)
Potassium: 4.1 mmol/L (ref 3.5–5.1)
Sodium: 142 mmol/L (ref 135–145)
Total Bilirubin: 0.7 mg/dL (ref 0.3–1.2)
Total Protein: 7.6 g/dL (ref 6.5–8.1)

## 2020-08-21 LAB — CBC
HCT: 36.9 % (ref 36.0–46.0)
Hemoglobin: 11.5 g/dL — ABNORMAL LOW (ref 12.0–15.0)
MCH: 26.2 pg (ref 26.0–34.0)
MCHC: 31.2 g/dL (ref 30.0–36.0)
MCV: 84.1 fL (ref 80.0–100.0)
Platelets: 331 10*3/uL (ref 150–400)
RBC: 4.39 MIL/uL (ref 3.87–5.11)
RDW: 15.9 % — ABNORMAL HIGH (ref 11.5–15.5)
WBC: 8 10*3/uL (ref 4.0–10.5)
nRBC: 0 % (ref 0.0–0.2)

## 2020-08-21 LAB — PROTIME-INR
INR: 1 (ref 0.8–1.2)
Prothrombin Time: 12.7 seconds (ref 11.4–15.2)

## 2020-08-21 LAB — APTT: aPTT: 36 seconds (ref 24–36)

## 2020-08-21 LAB — GLUCOSE, CAPILLARY: Glucose-Capillary: 96 mg/dL (ref 70–99)

## 2020-08-21 NOTE — Progress Notes (Addendum)
COVID Vaccine Completed:Yes Date COVID Vaccine completed:02/17/20 COVID vaccine manufacturer:  Moderna    PCP - Dr. Cherrie Distance Cardiologist - no  Chest x-ray - no EKG - 12/12/19-epic Stress Test -no  ECHO - 02/29/16-epic Cardiac Cath - no Pacemaker/ICD device last checked:NA  Sleep Study - yes. Pt never followed up CPAP - no  Fasting Blood Sugar - 95-134 Checks Blood Sugar QD_____ times a day  Blood Thinner Instructions:NA Aspirin Instructions: Last Dose:  Anesthesia review:   Patient denies shortness of breath, fever, cough and chest pain at PAT appointment yes   Patient verbalized understanding of instructions that were given to them at the PAT appointment. Patient was also instructed that they will need to review over the PAT instructions again at home before surgery. Yes  Pt uses a walker at home and a wheelchair when out. She does exercise in the chair every day. She has no SOB with ADLs . She doesn't climb stairs or do house work.

## 2020-08-21 NOTE — Patient Instructions (Addendum)
DUE TO COVID-19 ONLY ONE VISITOR IS ALLOWED TO COME WITH YOU AND STAY IN THE WAITING ROOM ONLY DURING PRE OP AND PROCEDURE DAY OF SURGERY. THE 1 VISITOR  MAY VISIT WITH YOU AFTER SURGERY IN YOUR PRIVATE ROOM DURING VISITING HOURS ONLY!  YOU NEED TO HAVE A COVID 19 TEST ON___9/17___ @_______ , THIS TEST MUST BE DONE BEFORE SURGERY,  COVID TESTING SITE [address] [place] [address], IT IS ON THE RIGHT GOING OUT [address] APPROXIMATELY  2 MINUTES PAST [employer] ON THE RIGHT. ONCE YOUR COVID TEST IS COMPLETED,  PLEASE BEGIN THE QUARANTINE INSTRUCTIONS AS OUTLINED IN YOUR HANDOUT.                CATHYRN DEAS    Your procedure is scheduled on: 08/27/20   Report to [facility] Main  Entrance   Report to Short Stay at 5:30 AM     Call this number if you have problems the morning of surgery [place], NO CHEWING GUM [place].   No food after midnight.    You may have clear liquid until 4:30 AM.    At 4:30 AM drink pre surgery drink.   Nothing by mouth after 4:30 AM.   Take these medicines the morning of surgery with A SIP OF WATER: Isorbide, Hydralazine, Nebivolol, Amlodipine, Diltiazem  DO NOT TAKE ANY DIABETIC MEDICATIONS DAY OF YOUR SURGERY   How to Manage Your Diabetes  Before and After Surgery  Why is it important to control my blood sugar before and after surgery? . Improving blood sugar levels before and after surgery helps healing and can limit problems. . A way of improving blood sugar control is eating a healthy diet by: o  Eating less sugar and carbohydrates o  Increasing activity/exercise o  Talking with your doctor about reaching your blood sugar goals . High blood sugars (greater than 180 mg/dL) can raise your risk of infections and slow your recovery, so you will need to focus on controlling your diabetes during the weeks before surgery. . Make sure that the  doctor who takes care of your diabetes knows about your planned surgery including the date and location.  How do I manage my blood sugar before surgery? . Check your blood sugar at least 4 times a day, starting 2 days before surgery, to make sure that the level is not too high or low. o Check your blood sugar the morning of your surgery when you wake up and every 2 hours until you get to the Short Stay unit. . If your blood sugar is less than 70 mg/dL, you will need to treat for low blood sugar: o Do not take insulin. o Treat a low blood sugar (less than 70 mg/dL) with  cup of clear juice (cranberry or apple), 4 glucose tablets, OR glucose gel. o Recheck blood sugar in 15 minutes after treatment (to make sure it is greater than 70 mg/dL). If your blood sugar is not greater than 70 mg/dL on recheck, call [phone] for further instructions. . Report your blood sugar to the short stay nurse when you get to Short Stay.  . If you are admitted to the hospital after surgery: o Your blood sugar will be checked by the staff and you will probably be given insulin after surgery (instead of oral diabetes medicines) to make sure you have good blood sugar levels.  o The goal for blood sugar control after surgery is 80-180 mg/dL.   WHAT DO I DO ABOUT MY DIABETES MEDICATION?  Marland Kitchen Do not take oral diabetes medicines (pills) the morning of surgery.                               You may not have any metal on your body including hair pins and              piercings  Do not wear jewelry, make-up, lotions, powders or perfumes, deodorant             Do not wear nail polish on your fingernails.  Do not shave  48 hours prior to surgery.               Do not bring valuables to the hospital. [place].  Contacts, dentures or bridgework may not be worn into surgery.                 Please read over the following fact sheets you were  given: _____________________________________________________________________             [facility] - Preparing for Surgery Before surgery, you can play an important role.   Because skin is not sterile, your skin needs to be as free of germs as possible.   You can reduce the number of germs on your skin by washing with CHG (chlorahexidine gluconate) soap before surgery.   CHG is an antiseptic cleaner which kills germs and bonds with the skin to continue killing germs even after washing. Please DO NOT use if you have an allergy to CHG or antibacterial soaps.   If your skin becomes reddened/irritated stop using the CHG and inform your nurse when you arrive at Short Stay. Do not shave (including legs and underarms) for at least 48 hours prior to the first CHG shower.   Please follow these instructions carefully:  1.  Shower with CHG Soap the night before surgery and the  morning of Surgery.  2.  If you choose to wash your hair, wash your hair first as usual with your  normal  shampoo.  3.  After you shampoo, rinse your hair and body thoroughly to remove the  shampoo.                                        4.  Use CHG as you would any other liquid soap.  You can apply chg directly  to the skin and wash                       Gently with a scrungie or clean washcloth.  5.  Apply the CHG Soap to your body ONLY FROM THE NECK DOWN.   Do not use on face/ open                           Wound or open sores. Avoid contact with eyes, ears mouth and genitals (private parts).                       Wash face,  Genitals (private parts) with  your normal soap.             6.  Wash thoroughly, paying special attention to the area where your surgery  will be performed.  7.  Thoroughly rinse your body with warm water from the neck down.  8.  DO NOT shower/wash with your normal soap after using and rinsing off  the CHG Soap.             9.  Pat yourself dry with a clean towel.            10.  Wear clean  pajamas.            11.  Place clean sheets on your bed the night of your first shower and do not  sleep with pets. Day of Surgery : Do not apply any lotions/deodorants the morning of surgery.  Please wear clean clothes to the hospital/surgery center.  FAILURE TO FOLLOW THESE INSTRUCTIONS MAY RESULT IN THE CANCELLATION OF YOUR SURGERY PATIENT SIGNATURE_________________________________  NURSE SIGNATURE__________________________________  ________________________________________________________________________   [name]  An incentive spirometer is a tool that can help keep your lungs clear and active. This tool measures how well you are filling your lungs with each breath. Taking long deep breaths may help reverse or decrease the chance of developing breathing (pulmonary) problems (especially infection) following:  A long period of time when you are unable to move or be active. BEFORE THE PROCEDURE   If the spirometer includes an indicator to show your best effort, your nurse or respiratory therapist will set it to a desired goal.  If possible, sit up straight or lean slightly forward. Try not to slouch.  Hold the incentive spirometer in an upright position. INSTRUCTIONS FOR USE  1. Sit on the edge of your bed if possible, or sit up as far as you can in bed or on a chair. 2. Hold the incentive spirometer in an upright position. 3. Breathe out normally. 4. Place the mouthpiece in your mouth and seal your lips tightly around it. 5. Breathe in slowly and as deeply as possible, raising the piston or the ball toward the top of the column. 6. Hold your breath for 3-5 seconds or for as long as possible. Allow the piston or ball to fall to the bottom of the column. 7. Remove the mouthpiece from your mouth and breathe out normally. 8. Rest for a few seconds and repeat Steps 1 through 7 at least 10 times every 1-2 hours when you are awake. Take your time and take a few normal breaths  between deep breaths. 9. The spirometer may include an indicator to show your best effort. Use the indicator as a goal to work toward during each repetition. 10. After each set of 10 deep breaths, practice coughing to be sure your lungs are clear. If you have an incision (the cut made at the time of surgery), support your incision when coughing by placing a pillow or rolled up towels firmly against it. Once you are able to get out of bed, walk around indoors and cough well. You may stop using the incentive spirometer when instructed by your caregiver.  RISKS AND COMPLICATIONS  Take your time so you do not get dizzy or light-headed.  If you are in pain, you may need to take or ask for pain medication before doing incentive spirometry. It is harder to take a deep breath if you are having pain. AFTER USE  Rest and breathe slowly and easily.  It can be helpful to keep track of a log of your progress. Your caregiver can provide you with a simple table to help with this. If you are using the spirometer at home, follow these instructions: [place] IF:   You are having difficultly using the spirometer.  You have trouble using the spirometer as often as instructed.  Your pain medication is not giving enough relief while using the spirometer.  You develop fever of 100.5 F (38.1 C) or higher. SEEK IMMEDIATE MEDICAL CARE IF:   You cough up bloody sputum that had not been present before.  You develop fever of 102 F (38.9 C) or greater.  You develop worsening pain at or near the incision site. MAKE SURE YOU:   Understand these instructions.  Will watch your condition.  Will get help right away if you are not doing well or get worse. Document Released: 04/06/2007 Document Revised: 02/16/2012 Document Reviewed: 06/07/2007 [facility] Patient Information 2014 [place], [place].   ________________________________________________________________________

## 2020-08-21 NOTE — Progress Notes (Signed)
Hello Tuwanna,  Your lab result is normal and/or stable.Some minor variations that are not significant are commonly marked abnormal, but do not represent any medical problem for you.  Best regards, Shakeira Rhee, M.D.

## 2020-08-22 ENCOUNTER — Other Ambulatory Visit: Payer: Self-pay | Admitting: Family Medicine

## 2020-08-22 NOTE — Progress Notes (Signed)
Spoke to patient's granddaughter. She was trying to schedule a COVID screening appt for her grandmother, that would meet her or her mother's work schedule, as they would need to transport the patient... COVID appt scheduled for 08/23/20 at 11:00 AM.

## 2020-08-23 ENCOUNTER — Encounter: Payer: Self-pay | Admitting: Family Medicine

## 2020-08-23 ENCOUNTER — Other Ambulatory Visit (HOSPITAL_COMMUNITY)
Admission: RE | Admit: 2020-08-23 | Discharge: 2020-08-23 | Disposition: A | Discharge status: Home / Self Care | Payer: Medicare HMO | Source: Ambulatory Visit | Attending: Orthopedic Surgery | Admitting: Orthopedic Surgery

## 2020-08-23 DIAGNOSIS — Z01812 Encounter for preprocedural laboratory examination: Secondary | ICD-10-CM | POA: Diagnosis not present

## 2020-08-23 DIAGNOSIS — Z20822 Contact with and (suspected) exposure to covid-19: Secondary | ICD-10-CM | POA: Diagnosis not present

## 2020-08-23 LAB — SARS CORONAVIRUS 2 (TAT 6-24 HRS): SARS Coronavirus 2: NEGATIVE

## 2020-08-23 NOTE — Progress Notes (Signed)
Anesthesia Chart Review   Case: [id number] Date/Time: 08/27/20 0700   Procedure: IRRIGATION AND DEBRIDEMENT; SPACER EXCHANGE RIGHT KNEE (Right Knee) - 100mn   Anesthesia type: Choice   Pre-op diagnosis: Septic arthritis right knee   [facility] ROOM 10 / [facility]   Surgeons: AGaynelle Arabian MD      DISCUSSION:74 y.o. never smoker with h/o HTN, CKD Stage III, DM II, CVA 2006, septic right knee scheduled for above procedure 08/27/2020 with Dr. FGaynelle Arabian   Last seen by PCP 08/16/2020, stable at this visit.  No anesthesia complications noted.  VS: BP (!) 158/82   Pulse (!) 59   Temp 37.2 C (Oral)   Resp 20   Ht _0  (1.676 m)   Wt 97.1 kg   SpO2 98%   BMI 34.54 kg/m   PROVIDERS: SClaretta Fraise MD is PCP    LABS: Labs reviewed: Acceptable for surgery. (all labs ordered are listed, but only abnormal results are displayed)  Labs Reviewed  CBC - Abnormal; Notable for the following components:      Result Value   Hemoglobin 11.5 (*)    RDW 15.9 (*)    All other components within normal limits  COMPREHENSIVE METABOLIC PANEL - Abnormal; Notable for the following components:   BUN 31 (*)    All other components within normal limits  APTT  PROTIME-INR  GLUCOSE, CAPILLARY  TYPE AND SCREEN     IMAGES:   EKG: 12/12/2019 Rate 61 bpm  Sinus rhythm  Prolonged PR interval  Probable left atrial enlargement Abnormal R-wave progression, late transition Left ventricular hypertrophy Baseline wander in lead (s) V2  CV: Echo 02/29/2016 Study Conclusions   - Left ventricle: abnormal septal motion. The cavity size was  mildly dilated. Wall thickness was increased in a pattern of mild  LVH. Systolic function was normal. The estimated ejection  fraction was in the range of 50% to 55%.  - Mitral valve: There was mild regurgitation.  - Left atrium: The atrium was mildly dilated.  - Tricuspid valve: There was moderate regurgitation.  - Pulmonary arteries: PA peak pressure:  39 mm Hg (S).  - Impressions: Asked to read by primary service in Dr harwani&'s que  from yesterday.   Impressions:   - Asked to read by primary service in Dr harwani&'s que from  yesterday.   Myocardial Perfusion 02/01/2010 IMPRESSION:  No evidence of ischemia or infarction.  Past Medical History:  Diagnosis Date  . Anemia   . Arthritis    Knee both knees  . Blood transfusion without reported diagnosis 2012   anemia;pt denies transfusion stated was only on iron tablet  . Cataract    left  . CKD (chronic kidney disease), stage III   . CVA (cerebral infarction)    2006  . Diabetes mellitus without complication [place]   . Family history of anesthesia complication    sister very slow to awaken after anesthesia;severe vomiting   . Gout    left elbow  . Groin pain 06/21/2019  . Hamstring tendinitis 08/22/2019  . Heart murmur   . 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 08-09-14 Right eye  . History of gout 01/04/2019  . Hyperlipidemia   . Hypertension   . Nocturia    3-4 times per night  . 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 Osteomyelitis of right [place]ia ([place] 07/23/2020  . Other acute osteomyelitis, right femur ([place] 07/23/2020  . 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 Stroke (  [place]) 2006   x 1 no deficits noted     Past Surgical History:  Procedure Laterality Date  . ABDOMINAL HYSTERECTOMY  1983  . CARPAL TUNNEL RELEASE Right 1983  . colonscopy  June 21, 2014  . EXCISIONAL TOTAL KNEE ARTHROPLASTY WITH ANTIBIOTIC SPACERS Right 02/16/2015   Procedure: RIGHT KNEE RESECTION ARTHROPLASTY WITH ANTIBIOTIC SPACERS;  Surgeon: [name], MD;  Location[facility];  Service: Orthopedics;  Laterality: Right;  . EXCISIONAL TOTAL KNEE ARTHROPLASTY WITH ANTIBIOTIC SPACERS Right 11/03/2018   Procedure: Right knee resection arthroplasty; antibiotic spacer;  Surgeon: [name], MD;  [facility];  Service: Orthopedics;  Laterality: Right;  Adductor Block   . I & D KNEE WITH POLY EXCHANGE Right 10/02/2014   Procedure: IRRIGATION AND DEBRIDEMENT RIGHT KNEE WITH POLY EXCHANGE;  Surgeon: Gearlean Alf, MD;  [facility];  Service: Orthopedics;  Laterality: Right;  . INCISION AND DRAINAGE OF WOUND Right 01/14/2017   Procedure: IRRIGATION AND DEBRIDEMENT WOUND;  Surgeon: [name], MD;  [facility];  Service: Orthopedics;  Laterality: Right;  requests 32mns  . JOINT REPLACEMENT  06/2014   right knee  . nasal cauterization  2012  . PATELLAR TENDON REPAIR Right 08/11/2014   Procedure: RIGHT PATELLA TENDON REPAIR;  Surgeon: FGearlean Alf MD;  [facility];  Service: Orthopedics;  Laterality: Right;  . REIMPLANTATION OF TOTAL KNEE Right 05/23/2015   Procedure: RIGHT KNEE ARTHROPLASTY REIMPLANTATION;  Surgeon: FGaynelle Arabian MD;  [facility];  Service: Orthopedics;  Laterality: Right;  . TOTAL KNEE ARTHROPLASTY Right 07/03/2014   Procedure: RIGHT TOTAL KNEE ARTHROPLASTY;  Surgeon: FGearlean Alf MD;  [facility];  Service: Orthopedics;  Laterality: Right;  . TUBAL LIGATION      MEDICATIONS: . acetaminophen (TYLENOL) 500 MG tablet  . Alcohol Swabs (B-D SINGLE USE SWABS REGULAR) PADS  . amLODipine (NORVASC) 10 MG tablet  . Ascorbic Acid (SUPER C COMPLEX PO)  . atorvastatin (LIPITOR) 20 MG tablet  . Blood Glucose Calibration (TRUE METRIX LEVEL 1) Low SOLN  . Blood Glucose Monitoring Suppl (TRUE METRIX AIR GLUCOSE METER) w/Device KIT  . COD LIVER OIL PO  . diltiazem (CARDIZEM CD) 240 MG 24 hr capsule  . ferrous sulfate 325 (65 FE) MG tablet  . fexofenadine (ALLEGRA) 180 MG tablet  . glucose blood (TRUE METRIX BLOOD GLUCOSE TEST) test strip  . hydrALAZINE (APRESOLINE) 100 MG tablet  . isosorbide dinitrate (ISORDIL) 10 MG tablet  . linagliptin (TRADJENTA) 5 MG TABS tablet  . metFORMIN (GLUCOPHAGE-XR) 500 MG 24 hr tablet  . Nebivolol HCl (BYSTOLIC) 20 MG TABS  . olmesartan (BENICAR) 40 MG tablet  . TRUEplus Lancets 33G  MISC   No current facility-administered medications for this encounter.   . tranexamic acid (CYKLOKAPRON) 2,000 mg in sodium chloride 0.9 % 50 mL Topical Application     JKonrad Felix PA-C [facility]-Surgical Testing [phone]

## 2020-08-24 ENCOUNTER — Other Ambulatory Visit (HOSPITAL_COMMUNITY): Payer: Medicare HMO

## 2020-08-26 MED ORDER — BUPIVACAINE LIPOSOME 1.3 % IJ SUSP
20.0000 mL | Freq: Once | INTRAMUSCULAR | Status: DC
  Filled 2020-08-26: qty 20

## 2020-08-26 NOTE — Anesthesia Preprocedure Evaluation (Addendum)
Anesthesia Evaluation  Patient identified by MRN, date of birth, ID band Patient awake    Reviewed: Allergy & Precautions, H&P , NPO status , Patient's Chart, lab work & pertinent test results  Airway Mallampati: II   Neck ROM: full    Dental  (+) Partial Upper, Poor Dentition, Missing   Pulmonary neg pulmonary ROS,    breath sounds clear to auscultation       Cardiovascular Exercise Tolerance: Poor hypertension,  Rhythm:regular Rate:Normal  METS <4 Most recent EKG reviewed   Neuro/Psych CVA, No Residual Symptoms negative psych ROS   GI/Hepatic negative GI ROS, Neg liver ROS,   Endo/Other  diabetes, Type 2  Renal/GU Renal InsufficiencyRenal disease     Musculoskeletal  (+) Arthritis , Osteoarthritis,    Abdominal (+) + obese,   Peds  Hematology  (+) anemia ,   Anesthesia Other Findings   Reproductive/Obstetrics                            Anesthesia Physical  Anesthesia Plan  ASA: III  Anesthesia Plan: General   Post-op Pain Management: GA combined w/ Regional for post-op pain   Induction: Intravenous  PONV Risk Score and Plan: 3 and Ondansetron, Dexamethasone and Treatment may vary due to age or medical condition  Airway Management Planned: LMA  Additional Equipment:   Intra-op Plan:   Post-operative Plan:   Informed Consent: I have reviewed the patients History and Physical, chart, labs and discussed the procedure including the risks, benefits and alternatives for the proposed anesthesia with the patient or authorized representative who has indicated his/her understanding and acceptance.     Dental advisory given  Plan Discussed with: CRNA and Anesthesiologist  Anesthesia Plan Comments: (Adductor canal block + GA/LMA. )       Anesthesia Quick Evaluation

## 2020-08-27 ENCOUNTER — Encounter (HOSPITAL_COMMUNITY): Payer: Self-pay | Admitting: Orthopedic Surgery

## 2020-08-27 ENCOUNTER — Ambulatory Visit (HOSPITAL_COMMUNITY): Payer: Medicare HMO | Admitting: Certified Registered Nurse Anesthetist

## 2020-08-27 ENCOUNTER — Inpatient Hospital Stay (HOSPITAL_COMMUNITY)
Admission: RE | Admit: 2020-08-27 | Discharge: 2020-08-29 | DRG: 486 | Disposition: A | Discharge status: Home / Self Care | Payer: Medicare HMO | Attending: Orthopedic Surgery | Admitting: Orthopedic Surgery

## 2020-08-27 ENCOUNTER — Ambulatory Visit (HOSPITAL_COMMUNITY): Payer: Medicare HMO | Admitting: Physician Assistant

## 2020-08-27 ENCOUNTER — Other Ambulatory Visit: Payer: Self-pay

## 2020-08-27 ENCOUNTER — Encounter (HOSPITAL_COMMUNITY)
Admission: RE | Disposition: A | Discharge status: Home / Self Care | Payer: Self-pay | Source: Home / Self Care | Attending: Orthopedic Surgery

## 2020-08-27 ENCOUNTER — Ambulatory Visit: Payer: Medicare HMO | Admitting: Infectious Disease

## 2020-08-27 DIAGNOSIS — G8918 Other acute postprocedural pain: Secondary | ICD-10-CM | POA: Diagnosis not present

## 2020-08-27 DIAGNOSIS — N183 Chronic kidney disease, stage 3 unspecified: Secondary | ICD-10-CM | POA: Diagnosis present

## 2020-08-27 DIAGNOSIS — M461 Sacroiliitis, not elsewhere classified: Secondary | ICD-10-CM | POA: Diagnosis present

## 2020-08-27 DIAGNOSIS — E1122 Type 2 diabetes mellitus with diabetic chronic kidney disease: Secondary | ICD-10-CM | POA: Diagnosis present

## 2020-08-27 DIAGNOSIS — E1169 Type 2 diabetes mellitus with other specified complication: Secondary | ICD-10-CM | POA: Diagnosis not present

## 2020-08-27 DIAGNOSIS — E785 Hyperlipidemia, unspecified: Secondary | ICD-10-CM | POA: Diagnosis present

## 2020-08-27 DIAGNOSIS — B965 Pseudomonas (aeruginosa) (mallei) (pseudomallei) as the cause of diseases classified elsewhere: Secondary | ICD-10-CM | POA: Diagnosis present

## 2020-08-27 DIAGNOSIS — I129 Hypertensive chronic kidney disease with stage 1 through stage 4 chronic kidney disease, or unspecified chronic kidney disease: Secondary | ICD-10-CM | POA: Diagnosis present

## 2020-08-27 DIAGNOSIS — Z8041 Family history of malignant neoplasm of ovary: Secondary | ICD-10-CM | POA: Diagnosis not present

## 2020-08-27 DIAGNOSIS — T8459XD Infection and inflammatory reaction due to other internal joint prosthesis, subsequent encounter: Secondary | ICD-10-CM | POA: Diagnosis not present

## 2020-08-27 DIAGNOSIS — M549 Dorsalgia, unspecified: Secondary | ICD-10-CM | POA: Diagnosis not present

## 2020-08-27 DIAGNOSIS — R32 Unspecified urinary incontinence: Secondary | ICD-10-CM | POA: Diagnosis not present

## 2020-08-27 DIAGNOSIS — M869 Osteomyelitis, unspecified: Secondary | ICD-10-CM | POA: Diagnosis not present

## 2020-08-27 DIAGNOSIS — D509 Iron deficiency anemia, unspecified: Secondary | ICD-10-CM | POA: Diagnosis not present

## 2020-08-27 DIAGNOSIS — M109 Gout, unspecified: Secondary | ICD-10-CM | POA: Diagnosis present

## 2020-08-27 DIAGNOSIS — M009 Pyogenic arthritis, unspecified: Secondary | ICD-10-CM | POA: Diagnosis present

## 2020-08-27 DIAGNOSIS — T8453XA Infection and inflammatory reaction due to internal right knee prosthesis, initial encounter: Principal | ICD-10-CM | POA: Diagnosis present

## 2020-08-27 DIAGNOSIS — Z4789 Encounter for other orthopedic aftercare: Secondary | ICD-10-CM | POA: Diagnosis not present

## 2020-08-27 DIAGNOSIS — Z833 Family history of diabetes mellitus: Secondary | ICD-10-CM | POA: Diagnosis not present

## 2020-08-27 DIAGNOSIS — Z6834 Body mass index (BMI) 34.0-34.9, adult: Secondary | ICD-10-CM | POA: Diagnosis not present

## 2020-08-27 DIAGNOSIS — Z8249 Family history of ischemic heart disease and other diseases of the circulatory system: Secondary | ICD-10-CM | POA: Diagnosis not present

## 2020-08-27 DIAGNOSIS — L989 Disorder of the skin and subcutaneous tissue, unspecified: Secondary | ICD-10-CM | POA: Diagnosis not present

## 2020-08-27 DIAGNOSIS — Z8673 Personal history of transient ischemic attack (TIA), and cerebral infarction without residual deficits: Secondary | ICD-10-CM | POA: Diagnosis not present

## 2020-08-27 DIAGNOSIS — Y831 Surgical operation with implant of artificial internal device as the cause of abnormal reaction of the patient, or of later complication, without mention of misadventure at the time of the procedure: Secondary | ICD-10-CM | POA: Diagnosis present

## 2020-08-27 HISTORY — DX: Pyogenic arthritis, unspecified: M00.9

## 2020-08-27 HISTORY — PX: I & D KNEE WITH POLY EXCHANGE: SHX5024

## 2020-08-27 LAB — HEMOGLOBIN A1C
Hgb A1c MFr Bld: 5.8 % — ABNORMAL HIGH (ref 4.8–5.6)
Mean Plasma Glucose: 119.76 mg/dL

## 2020-08-27 LAB — GLUCOSE, CAPILLARY
Glucose-Capillary: 134 mg/dL — ABNORMAL HIGH (ref 70–99)
Glucose-Capillary: 192 mg/dL — ABNORMAL HIGH (ref 70–99)
Glucose-Capillary: 176 mg/dL — ABNORMAL HIGH (ref 70–99)
Glucose-Capillary: 214 mg/dL — ABNORMAL HIGH (ref 70–99)
Glucose-Capillary: 214 mg/dL — ABNORMAL HIGH (ref 70–99)

## 2020-08-27 LAB — TYPE AND SCREEN
ABO/RH(D): O POS
Antibody Screen: NEGATIVE

## 2020-08-27 SURGERY — IRRIGATION AND DEBRIDEMENT KNEE WITH POLY EXCHANGE
Anesthesia: General | Site: Knee | Laterality: Right

## 2020-08-27 MED ORDER — FENTANYL CITRATE (PF) 100 MCG/2ML IJ SOLN
INTRAMUSCULAR | Status: AC
Start: 2020-08-27 — End: 2020-08-27
  Filled 2020-08-27: qty 4

## 2020-08-27 MED ORDER — LINAGLIPTIN 5 MG PO TABS
5.0000 mg | ORAL_TABLET | Freq: Every day | ORAL | Status: DC
  Administered 2020-08-28 – 2020-08-29 (×2): 5 mg via ORAL
  Filled 2020-08-27 (×2): qty 1

## 2020-08-27 MED ORDER — DOCUSATE SODIUM 100 MG PO CAPS
100.0000 mg | ORAL_CAPSULE | Freq: Two times a day (BID) | ORAL | Status: DC
  Administered 2020-08-27 – 2020-08-29 (×4): 100 mg via ORAL
  Filled 2020-08-27 (×4): qty 1

## 2020-08-27 MED ORDER — AMLODIPINE BESYLATE 10 MG PO TABS
10.0000 mg | ORAL_TABLET | Freq: Every day | ORAL | Status: DC
  Administered 2020-08-28 – 2020-08-29 (×2): 10 mg via ORAL
  Filled 2020-08-27 (×2): qty 1

## 2020-08-27 MED ORDER — LACTATED RINGERS IV SOLN: INTRAVENOUS | Status: DC

## 2020-08-27 MED ORDER — METOCLOPRAMIDE HCL 5 MG PO TABS: 5.0000 mg | ORAL_TABLET | Freq: Three times a day (TID) | ORAL | Status: DC | PRN

## 2020-08-27 MED ORDER — FENTANYL CITRATE (PF) 100 MCG/2ML IJ SOLN
INTRAMUSCULAR | Status: DC | PRN
Start: 2020-08-27 — End: 2020-08-27
  Administered 2020-08-27: 50 ug via INTRAVENOUS
  Administered 2020-08-27 (×8): 25 ug via INTRAVENOUS

## 2020-08-27 MED ORDER — ONDANSETRON HCL 4 MG/2ML IJ SOLN
4.0000 mg | Freq: Four times a day (QID) | INTRAMUSCULAR | Status: DC | PRN
  Administered 2020-08-28: 4 mg via INTRAVENOUS
  Filled 2020-08-27: qty 2

## 2020-08-27 MED ORDER — ACETAMINOPHEN 500 MG PO TABS
500.0000 mg | ORAL_TABLET | Freq: Four times a day (QID) | ORAL | Status: AC
  Administered 2020-08-27 (×3): 500 mg via ORAL
  Filled 2020-08-27 (×3): qty 1

## 2020-08-27 MED ORDER — HYDRALAZINE HCL 50 MG PO TABS
100.0000 mg | ORAL_TABLET | Freq: Every day | ORAL | Status: DC
  Administered 2020-08-28 – 2020-08-29 (×2): 100 mg via ORAL
  Filled 2020-08-27 (×2): qty 2

## 2020-08-27 MED ORDER — METHOCARBAMOL 500 MG PO TABS
500.0000 mg | ORAL_TABLET | Freq: Four times a day (QID) | ORAL | Status: DC | PRN
  Administered 2020-08-27: 500 mg via ORAL
  Filled 2020-08-27: qty 1

## 2020-08-27 MED ORDER — BISACODYL 10 MG RE SUPP: 10.0000 mg | Freq: Every day | RECTAL | Status: DC | PRN

## 2020-08-27 MED ORDER — LORATADINE 10 MG PO TABS
10.0000 mg | ORAL_TABLET | Freq: Every day | ORAL | Status: DC
  Administered 2020-08-28 – 2020-08-29 (×2): 10 mg via ORAL
  Filled 2020-08-27 (×2): qty 1

## 2020-08-27 MED ORDER — ACETAMINOPHEN 10 MG/ML IV SOLN
1000.0000 mg | Freq: Four times a day (QID) | INTRAVENOUS | Status: DC
  Administered 2020-08-27: 1000 mg via INTRAVENOUS
  Filled 2020-08-27: qty 100

## 2020-08-27 MED ORDER — CHLORHEXIDINE GLUCONATE CLOTH 2 % EX PADS
6.0000 | MEDICATED_PAD | Freq: Every day | CUTANEOUS | Status: DC
  Administered 2020-08-28 – 2020-08-29 (×2): 6 via TOPICAL

## 2020-08-27 MED ORDER — CHLORHEXIDINE GLUCONATE 0.12 % MT SOLN
15.0000 mL | Freq: Once | OROMUCOSAL | Status: AC
  Administered 2020-08-27: 15 mL via OROMUCOSAL

## 2020-08-27 MED ORDER — ATORVASTATIN CALCIUM 20 MG PO TABS
20.0000 mg | ORAL_TABLET | Freq: Every evening | ORAL | Status: DC
  Administered 2020-08-27 – 2020-08-28 (×2): 20 mg via ORAL
  Filled 2020-08-27 (×2): qty 1

## 2020-08-27 MED ORDER — VANCOMYCIN HCL 2000 MG/400ML IV SOLN
2000.0000 mg | Freq: Once | INTRAVENOUS | Status: AC
  Administered 2020-08-27: 2000 mg via INTRAVENOUS
  Filled 2020-08-27: qty 400

## 2020-08-27 MED ORDER — CEFAZOLIN SODIUM-DEXTROSE 2-4 GM/100ML-% IV SOLN
2.0000 g | INTRAVENOUS | Status: AC
  Administered 2020-08-27: 2 g via INTRAVENOUS
  Filled 2020-08-27: qty 100

## 2020-08-27 MED ORDER — SODIUM CHLORIDE 0.9 % IV SOLN: INTRAVENOUS | Status: DC

## 2020-08-27 MED ORDER — DEXAMETHASONE SODIUM PHOSPHATE 10 MG/ML IJ SOLN
INTRAMUSCULAR | Status: AC
  Filled 2020-08-27: qty 1

## 2020-08-27 MED ORDER — ORAL CARE MOUTH RINSE: 15.0000 mL | Freq: Once | OROMUCOSAL | Status: AC

## 2020-08-27 MED ORDER — TRANEXAMIC ACID-NACL 1000-0.7 MG/100ML-% IV SOLN
1000.0000 mg | INTRAVENOUS | Status: AC
  Administered 2020-08-27: 1000 mg via INTRAVENOUS
  Filled 2020-08-27: qty 100

## 2020-08-27 MED ORDER — 0.9 % SODIUM CHLORIDE (POUR BTL) OPTIME
TOPICAL | Status: DC | PRN
  Administered 2020-08-27: 1000 mL

## 2020-08-27 MED ORDER — PHENOL 1.4 % MT LIQD: 1.0000 | OROMUCOSAL | Status: DC | PRN

## 2020-08-27 MED ORDER — POVIDONE-IODINE 10 % EX SWAB
2.0000 "application " | Freq: Once | CUTANEOUS | Status: AC
  Administered 2020-08-27: 2 via TOPICAL

## 2020-08-27 MED ORDER — ONDANSETRON HCL 4 MG/2ML IJ SOLN: 4.0000 mg | Freq: Once | INTRAMUSCULAR | Status: DC | PRN

## 2020-08-27 MED ORDER — IRBESARTAN 150 MG PO TABS
300.0000 mg | ORAL_TABLET | Freq: Every day | ORAL | Status: DC
  Administered 2020-08-28 – 2020-08-29 (×2): 300 mg via ORAL
  Filled 2020-08-27 (×2): qty 2

## 2020-08-27 MED ORDER — METOCLOPRAMIDE HCL 5 MG/ML IJ SOLN
5.0000 mg | Freq: Three times a day (TID) | INTRAMUSCULAR | Status: DC | PRN
  Administered 2020-08-27: 5 mg via INTRAVENOUS
  Filled 2020-08-27 (×2): qty 2

## 2020-08-27 MED ORDER — VANCOMYCIN HCL 1250 MG/250ML IV SOLN
1250.0000 mg | INTRAVENOUS | Status: DC
  Administered 2020-08-28 – 2020-08-29 (×2): 1250 mg via INTRAVENOUS
  Filled 2020-08-27 (×2): qty 250

## 2020-08-27 MED ORDER — FENTANYL CITRATE (PF) 100 MCG/2ML IJ SOLN
25.0000 ug | INTRAMUSCULAR | Status: DC | PRN
  Administered 2020-08-27 (×3): 50 ug via INTRAVENOUS

## 2020-08-27 MED ORDER — ISOSORBIDE DINITRATE 10 MG PO TABS
10.0000 mg | ORAL_TABLET | Freq: Two times a day (BID) | ORAL | Status: DC
  Administered 2020-08-27 – 2020-08-29 (×4): 10 mg via ORAL
  Filled 2020-08-27 (×4): qty 1

## 2020-08-27 MED ORDER — FLEET ENEMA 7-19 GM/118ML RE ENEM: 1.0000 | ENEMA | Freq: Once | RECTAL | Status: DC | PRN

## 2020-08-27 MED ORDER — HYDROCODONE-ACETAMINOPHEN 5-325 MG PO TABS
1.0000 | ORAL_TABLET | ORAL | Status: DC | PRN
  Administered 2020-08-27: 1 via ORAL
  Administered 2020-08-28 – 2020-08-29 (×3): 2 via ORAL
  Filled 2020-08-27: qty 2
  Filled 2020-08-27: qty 1
  Filled 2020-08-27 (×3): qty 2

## 2020-08-27 MED ORDER — NEBIVOLOL HCL 10 MG PO TABS
20.0000 mg | ORAL_TABLET | Freq: Every day | ORAL | Status: DC
  Administered 2020-08-28 – 2020-08-29 (×2): 20 mg via ORAL
  Filled 2020-08-27 (×2): qty 2

## 2020-08-27 MED ORDER — SODIUM CHLORIDE 0.9 % IR SOLN
Status: DC | PRN
  Administered 2020-08-27: 4000 mL

## 2020-08-27 MED ORDER — BUPIVACAINE LIPOSOME 1.3 % IJ SUSP
INTRAMUSCULAR | Status: DC | PRN
  Administered 2020-08-27: 10 mL

## 2020-08-27 MED ORDER — DILTIAZEM HCL ER COATED BEADS 240 MG PO CP24
240.0000 mg | ORAL_CAPSULE | Freq: Every day | ORAL | Status: DC
  Administered 2020-08-28 – 2020-08-29 (×2): 240 mg via ORAL
  Filled 2020-08-27 (×2): qty 1

## 2020-08-27 MED ORDER — MORPHINE SULFATE (PF) 2 MG/ML IV SOLN
0.5000 mg | INTRAVENOUS | Status: DC | PRN
  Administered 2020-08-27 (×2): 1 mg via INTRAVENOUS
  Filled 2020-08-27 (×2): qty 1

## 2020-08-27 MED ORDER — METHOCARBAMOL 500 MG IVPB - SIMPLE MED
500.0000 mg | Freq: Four times a day (QID) | INTRAVENOUS | Status: DC | PRN
  Administered 2020-08-27: 500 mg via INTRAVENOUS
  Filled 2020-08-27: qty 50

## 2020-08-27 MED ORDER — DEXAMETHASONE SODIUM PHOSPHATE 10 MG/ML IJ SOLN
10.0000 mg | Freq: Once | INTRAMUSCULAR | Status: AC
  Administered 2020-08-28: 10 mg via INTRAVENOUS
  Filled 2020-08-27: qty 1

## 2020-08-27 MED ORDER — INSULIN ASPART 100 UNIT/ML ~~LOC~~ SOLN
0.0000 [IU] | Freq: Three times a day (TID) | SUBCUTANEOUS | Status: DC
  Administered 2020-08-27: 3 [IU] via SUBCUTANEOUS
  Administered 2020-08-27: 5 [IU] via SUBCUTANEOUS
  Administered 2020-08-28: 2 [IU] via SUBCUTANEOUS
  Administered 2020-08-28: 3 [IU] via SUBCUTANEOUS
  Administered 2020-08-28: 5 [IU] via SUBCUTANEOUS
  Administered 2020-08-29: 2 [IU] via SUBCUTANEOUS

## 2020-08-27 MED ORDER — LIDOCAINE 2% (20 MG/ML) 5 ML SYRINGE
INTRAMUSCULAR | Status: DC | PRN
  Administered 2020-08-27: 40 mg via INTRAVENOUS

## 2020-08-27 MED ORDER — MENTHOL 3 MG MT LOZG: 1.0000 | LOZENGE | OROMUCOSAL | Status: DC | PRN

## 2020-08-27 MED ORDER — MIDAZOLAM HCL 2 MG/2ML IJ SOLN
INTRAMUSCULAR | Status: AC
  Filled 2020-08-27: qty 2

## 2020-08-27 MED ORDER — PROPOFOL 10 MG/ML IV BOLUS
INTRAVENOUS | Status: AC
  Filled 2020-08-27: qty 20

## 2020-08-27 MED ORDER — BUPIVACAINE HCL (PF) 0.5 % IJ SOLN
INTRAMUSCULAR | Status: DC | PRN
  Administered 2020-08-27: 20 mL

## 2020-08-27 MED ORDER — METHOCARBAMOL 500 MG IVPB - SIMPLE MED
INTRAVENOUS | Status: AC
  Filled 2020-08-27: qty 50

## 2020-08-27 MED ORDER — EPHEDRINE SULFATE-NACL 50-0.9 MG/10ML-% IV SOSY
PREFILLED_SYRINGE | INTRAVENOUS | Status: DC | PRN
  Administered 2020-08-27 (×2): 10 mg via INTRAVENOUS
  Administered 2020-08-27 (×2): 5 mg via INTRAVENOUS

## 2020-08-27 MED ORDER — DEXAMETHASONE SODIUM PHOSPHATE 10 MG/ML IJ SOLN
8.0000 mg | Freq: Once | INTRAMUSCULAR | Status: AC
  Administered 2020-08-27: 8 mg via INTRAVENOUS

## 2020-08-27 MED ORDER — RIVAROXABAN 10 MG PO TABS
10.0000 mg | ORAL_TABLET | Freq: Every day | ORAL | Status: DC
  Administered 2020-08-28 – 2020-08-29 (×2): 10 mg via ORAL
  Filled 2020-08-27 (×2): qty 1

## 2020-08-27 MED ORDER — POLYETHYLENE GLYCOL 3350 17 G PO PACK: 17.0000 g | PACK | Freq: Every day | ORAL | Status: DC | PRN

## 2020-08-27 MED ORDER — LIDOCAINE 2% (20 MG/ML) 5 ML SYRINGE
INTRAMUSCULAR | Status: AC
  Filled 2020-08-27: qty 5

## 2020-08-27 MED ORDER — SODIUM CHLORIDE 0.9 % IV SOLN
2.0000 g | Freq: Two times a day (BID) | INTRAVENOUS | Status: DC
  Administered 2020-08-27 – 2020-08-29 (×5): 2 g via INTRAVENOUS
  Filled 2020-08-27 (×8): qty 2

## 2020-08-27 MED ORDER — STERILE WATER FOR IRRIGATION IR SOLN
Status: DC | PRN
  Administered 2020-08-27: 2000 mL

## 2020-08-27 MED ORDER — TRAMADOL HCL 50 MG PO TABS
50.0000 mg | ORAL_TABLET | Freq: Four times a day (QID) | ORAL | Status: DC | PRN
  Administered 2020-08-27: 100 mg via ORAL
  Filled 2020-08-27: qty 2

## 2020-08-27 MED ORDER — DIPHENHYDRAMINE HCL 12.5 MG/5ML PO ELIX: 12.5000 mg | ORAL_SOLUTION | ORAL | Status: DC | PRN

## 2020-08-27 MED ORDER — PROPOFOL 10 MG/ML IV BOLUS
INTRAVENOUS | Status: DC | PRN
  Administered 2020-08-27: 150 mg via INTRAVENOUS

## 2020-08-27 MED ORDER — PHENYLEPHRINE HCL (PRESSORS) 10 MG/ML IV SOLN
INTRAVENOUS | Status: AC
  Filled 2020-08-27: qty 1

## 2020-08-27 MED ORDER — ONDANSETRON HCL 4 MG/2ML IJ SOLN
INTRAMUSCULAR | Status: AC
  Filled 2020-08-27: qty 2

## 2020-08-27 MED ORDER — FENTANYL CITRATE (PF) 250 MCG/5ML IJ SOLN
INTRAMUSCULAR | Status: AC
  Filled 2020-08-27: qty 5

## 2020-08-27 MED ORDER — ONDANSETRON HCL 4 MG/2ML IJ SOLN
INTRAMUSCULAR | Status: DC | PRN
  Administered 2020-08-27: 4 mg via INTRAVENOUS

## 2020-08-27 MED ORDER — AMISULPRIDE (ANTIEMETIC) 5 MG/2ML IV SOLN: 10.0000 mg | Freq: Once | INTRAVENOUS | Status: DC | PRN

## 2020-08-27 MED ORDER — ONDANSETRON HCL 4 MG PO TABS: 4.0000 mg | ORAL_TABLET | Freq: Four times a day (QID) | ORAL | Status: DC | PRN

## 2020-08-27 SURGICAL SUPPLY — 57 items
BAG SPEC THK2 15X12 ZIP CLS (MISCELLANEOUS) ×1
BAG ZIPLOCK 12X15 (MISCELLANEOUS) ×3 IMPLANT
BLADE SURG SZ10 CARB STEEL (BLADE) ×2 IMPLANT
BNDG ELASTIC 6X5.8 VLCR STR LF (GAUZE/BANDAGES/DRESSINGS) ×3 IMPLANT
BONE CEMENT GENTAMICIN (Cement) ×15 IMPLANT
BOWL SMART MIX CTS (DISPOSABLE) ×4 IMPLANT
CEMENT BONE GENTAMICIN 40 (Cement) IMPLANT
CLOSURE WOUND 1/2 X4 (GAUZE/BANDAGES/DRESSINGS) ×2
CNTNR URN SCR LID CUP LEK RST (MISCELLANEOUS) IMPLANT
CONT SPEC 4OZ STRL OR WHT (MISCELLANEOUS) ×6
COVER SURGICAL LIGHT HANDLE (MISCELLANEOUS) ×3 IMPLANT
COVER WAND RF STERILE (DRAPES) ×2 IMPLANT
CUFF TOURN SGL QUICK 34 (TOURNIQUET CUFF) ×3
CUFF TRNQT CYL 34X4.125X (TOURNIQUET CUFF) ×1 IMPLANT
DRAPE U-SHAPE 47X51 STRL (DRAPES) ×3 IMPLANT
DRSG ADAPTIC 3X8 NADH LF (GAUZE/BANDAGES/DRESSINGS) ×3 IMPLANT
DRSG PAD ABDOMINAL 8X10 ST (GAUZE/BANDAGES/DRESSINGS) ×5 IMPLANT
DURAPREP 26ML APPLICATOR (WOUND CARE) ×3 IMPLANT
ELECT REM PT RETURN 15FT ADLT (MISCELLANEOUS) ×3 IMPLANT
EVACUATOR 1/8 PVC DRAIN (DRAIN) ×3 IMPLANT
GAUZE SPONGE 4X4 12PLY STRL (GAUZE/BANDAGES/DRESSINGS) ×3 IMPLANT
GLOVE BIO SURGEON STRL SZ7 (GLOVE) ×3 IMPLANT
GLOVE BIO SURGEON STRL SZ8 (GLOVE) ×3 IMPLANT
GLOVE BIOGEL PI IND STRL 6.5 (GLOVE) ×1 IMPLANT
GLOVE BIOGEL PI IND STRL 7.0 (GLOVE) ×1 IMPLANT
GLOVE BIOGEL PI IND STRL 8 (GLOVE) ×1 IMPLANT
GLOVE BIOGEL PI INDICATOR 6.5 (GLOVE) ×2
GLOVE BIOGEL PI INDICATOR 7.0 (GLOVE) ×2
GLOVE BIOGEL PI INDICATOR 8 (GLOVE) ×2
GLOVE SURG SS PI 6.5 STRL IVOR (GLOVE) ×3 IMPLANT
GOWN STRL REUS W/TWL LRG LVL3 (GOWN DISPOSABLE) ×9 IMPLANT
HANDPIECE INTERPULSE COAX TIP (DISPOSABLE) ×3
IMMOBILIZER KNEE 20 (SOFTGOODS) ×3
IMMOBILIZER KNEE 20 THIGH 36 (SOFTGOODS) ×1 IMPLANT
IMMOBILIZER KNEE 22 UNIV (SOFTGOODS) ×2 IMPLANT
KIT TURNOVER KIT A (KITS) IMPLANT
MANIFOLD NEPTUNE II (INSTRUMENTS) ×3 IMPLANT
NS IRRIG 1000ML POUR BTL (IV SOLUTION) ×3 IMPLANT
PACK TOTAL KNEE CUSTOM (KITS) ×3 IMPLANT
PADDING CAST COTTON 6X4 STRL (CAST SUPPLIES) ×6 IMPLANT
PENCIL SMOKE EVACUATOR (MISCELLANEOUS) IMPLANT
PROTECTOR NERVE ULNAR (MISCELLANEOUS) ×3 IMPLANT
SET HNDPC FAN SPRY TIP SCT (DISPOSABLE) ×1 IMPLANT
STAPLER VISISTAT 35W (STAPLE) ×5 IMPLANT
STRIP CLOSURE SKIN 1/2X4 (GAUZE/BANDAGES/DRESSINGS) ×4 IMPLANT
SUT MNCRL AB 4-0 PS2 18 (SUTURE) ×3 IMPLANT
SUT STRATAFIX 0 PDS 27 VIOLET (SUTURE) ×3
SUT VIC AB 2-0 CT1 27 (SUTURE) ×12
SUT VIC AB 2-0 CT1 TAPERPNT 27 (SUTURE) ×3 IMPLANT
SUTURE STRATFX 0 PDS 27 VIOLET (SUTURE) IMPLANT
SWAB COLLECTION DEVICE MRSA (MISCELLANEOUS) ×5 IMPLANT
SWAB CULTURE ESWAB REG 1ML (MISCELLANEOUS) ×3 IMPLANT
TRAY FOLEY MTR SLVR 16FR STAT (SET/KITS/TRAYS/PACK) ×3 IMPLANT
WATER STERILE IRR 1000ML POUR (IV SOLUTION) ×3 IMPLANT
WRAP KNEE MAXI GEL POST OP (GAUZE/BANDAGES/DRESSINGS) ×4 IMPLANT
YANKAUER SUCT BULB TIP 10FT TU (MISCELLANEOUS) ×3 IMPLANT
YANKAUER SUCT BULB TIP NO VENT (SUCTIONS) ×2 IMPLANT

## 2020-08-27 NOTE — Progress Notes (Signed)
Orthopedic Tech Progress Note Patient Details:  Beth [name] [birth year] [id number]  Ortho Devices Type of Ortho Device: Knee Immobilizer Ortho Device/Splint Location: replacement Ortho Device/Splint Interventions: Application   Post Interventions Patient Tolerated: Well   [name] 08/27/2020, 10:37 AM

## 2020-08-27 NOTE — Op Note (Signed)
NAMETREZURE, CRONK MEDICAL RECORD HU:[id number] ACCOUNT [id number] DATE OF BIRTH:1946-08-04 [facility] LOCATION: [facility] PHYSICIAN:Azelia Reiger Zella Ball, MD  OPERATIVE REPORT  DATE OF PROCEDURE:  08/27/2020  PREOPERATIVE DIAGNOSIS:  Septic arthritis, right knee.  POSTOPERATIVE DIAGNOSIS:  Septic arthritis, right knee.  PROCEDURE:  Irrigation and debridement, right knee with antibiotic spacer exchange.  SURGEON:  [name], MD  ASSISTANT:  Molli Barrows, PA-C  ANESTHESIA:  Adductor canal block and general.  ESTIMATED BLOOD LOSS:  200 mL.  DRAINS:  Hemovac x1.  COMPLICATIONS:  None.  CONDITION:  Stable to recovery.  BRIEF CLINICAL NOTE:  The patient is a 74 year old female with long complex history in regards to her right knee.  She has an antibiotic spacer in place, but her sed rate is not normalizing back to regular normal levels.  In addition, the spacer has  dislodged from the tibia.  She has had multiple discussions about next steps including exchanging the spacer with irrigation, debridement versus fusion versus amputation.  She has elected to exchange the spacer with an irrigation and debridement and  multiple cultures.  She presents today for that operation.  PROCEDURE IN DETAIL:  After successful administration of adductor canal block and general anesthetic, a tourniquet was placed high on her right thigh and right lower extremity prepped and draped in the usual sterile fashion.  Extremity was wrapped in  Esmarch, tourniquet inflated to 300 mmHg.  Previous midline incisions were utilized.  Skin cut with a 10 blade through subcutaneous tissue.  Subcutaneous flaps were created circumferentially.  A fresh blade was used to make a medial parapatellar  arthrotomy.  There was minimal fluid in the joint, but that specimen of the synovial fluid was sent for Gram stain, culture and sensitivity.  Soft tissue over the proximal medial tibia subperiosteally elevated through the  semimembranosus bursa.   Laterally, tissue was elevated with attention being paid to avoid the patellar tendon on tibial tubercle.  The spacer was identified and that was removed.  There is some fluid posterior to the spacer and we sent some of that fluid also for the Gram stain  culture and sensitivity.  There was a rind of tissue overlying the distal femur, proximal tibia and that is removed.  I then accessed the femoral canal with a drill.  I used a curette to debride the femoral canal and the distal femur of all soft tissue.   I did send some of this tissue for culture and sensitivity.  Similarly, we did the same on the tibia sending tissue for culture and sensitivity.  I also sent a small specimen of synovial tissue for culture and sensitivity.  After we had thoroughly  debrided the canals of the femur and tibia and the proximal metaphyseal region with the curettes to get back to normal bleeding bone, then, I thoroughly irrigated with 3 liters of saline using pulsatile lavage.  The tissue had a healthy appearance at  this point.  We then mixed 4 batches of gentamicin impregnated cement and once it was ready for implantation then we formulated a spacer.  I used 2 Steinmann pins as an endoskeleton for the spacer to serve as an IM rod.  There were extensions up into the  femoral canal proximally and down into the tibial canal distally.  This effectively modeled the fusion.  The knee was found to be very stable with the spacer in place.  In addition, we irrigated with another liter of saline with pulsatile lavage.  The  arthrotomy was then closed over a Hemovac drain with running 0 Stratafix suture.  Tourniquet was released.  Total time of 55 minutes.  Subcutaneous was then closed with interrupted 2-0 Vicryl and skin with staples.  The incision was cleaned and dried and  a bulky sterile dressing applied.  Drain was hooked to suction and she was placed into a knee immobilizer, awakened and transported to  recovery in stable condition.  CN/NUANCE  D:08/27/2020 T:08/27/2020 JOB:[id number]

## 2020-08-27 NOTE — Anesthesia Postprocedure Evaluation (Signed)
Anesthesia Post Note  Patient: Beth [name]  Procedure(s) Performed: IRRIGATION AND DEBRIDEMENT; SPACER EXCHANGE RIGHT KNEE with multiple specimens (Right Knee)     Patient location during evaluation: PACU Anesthesia Type: General Level of consciousness: awake and alert Pain management: pain level controlled Vital Signs Assessment: post-procedure vital signs reviewed and stable Respiratory status: spontaneous breathing, nonlabored ventilation and respiratory function stable Cardiovascular status: blood pressure returned to baseline and stable Postop Assessment: no apparent nausea or vomiting Anesthetic complications: no   No complications documented.  Last Vitals:  Vitals:   08/27/20 1000 08/27/20 1026  BP: 130/66 135/66  Pulse: 61 (!) 59  Resp: 18 20  Temp: (!) 36.4 C 36.5 C  SpO2: 95% 98%    Last Pain:  Vitals:   08/27/20 1026  TempSrc: Oral  PainSc:                  Merlinda Frederick

## 2020-08-27 NOTE — Brief Op Note (Signed)
08/27/2020  9:02 AM  PATIENT:  Beth [name]  74 y.o. female  PRE-OPERATIVE DIAGNOSIS:  Septic arthritis right knee  POST-OPERATIVE DIAGNOSIS:  Septic arthritis right knee  PROCEDURE:  Procedure(s) with comments: IRRIGATION AND DEBRIDEMENT; SPACER EXCHANGE RIGHT KNEE with multiple specimens (Right) - 59min  SURGEON:  Surgeon(s) and Role:    [name], MD - Primary  PHYSICIAN ASSISTANT:   ASSISTANTS: Molli Barrows, PA-C   ANESTHESIA:   general and adductor canal block  EBL:  200 mL   BLOOD ADMINISTERED:none  DRAINS: (Medium) Hemovact drain(s) in the right knee with  Suction Open   LOCAL MEDICATIONS USED:  NONE  COUNTS:  YES  TOURNIQUET:   Total Tourniquet Time Documented: Thigh (Right) - 55 minutes Total: Thigh (Right) - 55 minutes   DICTATION: .Other Dictation: Dictation Number [id number]  PLAN OF CARE: Admit for overnight observation  PATIENT DISPOSITION:  PACU - hemodynamically stable.

## 2020-08-27 NOTE — Progress Notes (Signed)
Pharmacy Antibiotic Note  Beth [name] is a 74 y.o. 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  She is currently undergoing an ID and as well as spacer exchange of the right knee in the OR.  A previous culture of the knee in 10/2018 showed pseudomonas  Pharmacy has been consulted for cefepime dosing. Most recent SCr -0.92 and CrCl ~ 61 ml/min. Baseline SCr appears to be 0.9-1.2 Given patient age and baseline renal function, will dose conservatively.   Plan: Cefepime 2 gm IV Q 12 hours  Monitor culture data and renal function      Temp (24hrs), Avg:97.5 F (36.4 C), Min:97.5 F (36.4 C), Max:97.5 F (36.4 C)  Recent Labs  Lab 08/21/20 1513  WBC 8.0  CREATININE 0.92    Estimated Creatinine Clearance: 61.1 mL/min (by C-G formula based on SCr of 0.92 mg/dL).    Allergies  Allergen Reactions  . Aleve [Naproxen Sodium] Other (See Comments)    Heart races  . Asa [Aspirin] Other (See Comments)    Nose bleeding  . Ciprofloxacin Other (See Comments)    Possible hamstring tendinopathy  . Clonidine Derivatives Other (See Comments)    Dizziness and weakness  . Shellfish Allergy Nausea And Vomiting     Thank you for allowing pharmacy to be a part of this patient's care.  Jimmy Footman, PharmD, BCPS, BCIDP Infectious Diseases Clinical Pharmacist Phone: [phone] 08/27/2020 9:08 AM

## 2020-08-27 NOTE — Consult Note (Signed)
[facility] for Infectious Disease    Date of Admission:  08/27/2020   Total days of antibiotics: 0 vanco/cefepime               Reason for Consult: R TKR infection, chronic    Referring Provider: Alusio   Assessment: Chronic osteo R knee, TKR DM2  Plan: 1. Continue vanco/cefepime 2. awiat her Cx 3. Will send sample to [facility] for broad based RNA PCR for bacterail, fungal pathogens.   Comment- Daughter asks if she will go home with a PIC line then let's [place] know that Dr Maureen Ralphs did not find any pus at time of surgery.  Will f/u with ortho, Dr Tommy Medal regarding planning.  In the mean time, will await her Cx and studies.   Thank you so much for this interesting consult,  Active Problems:   Septic arthritis of knee, right ([place])   . acetaminophen  500 mg Oral Q6H  . [START ON 08/28/2020] amLODipine  10 mg Oral Daily  . atorvastatin  20 mg Oral QPM  . [START ON 08/28/2020] dexamethasone (DECADRON) injection  10 mg Intravenous Once  . [START ON 08/28/2020] diltiazem  240 mg Oral Daily  . docusate sodium  100 mg Oral BID  . fentaNYL      . [START ON 08/28/2020] hydrALAZINE  100 mg Oral Daily  . insulin aspart  0-15 Units Subcutaneous TID WC  . [START ON 08/28/2020] irbesartan  300 mg Oral Daily  . isosorbide dinitrate  10 mg Oral BID  . [START ON 08/28/2020] linagliptin  5 mg Oral Daily  . [START ON 08/28/2020] loratadine  10 mg Oral Daily  . [START ON 08/28/2020] nebivolol  20 mg Oral Daily  . [START ON 08/28/2020] rivaroxaban  10 mg Oral Q breakfast    HPI: Beth [name] is a 74 y.o. female with hx DM2, of R TKR 06-2014. She developed infection October 2015 requiring poly exchange and anbx. (Cx -). She then had R resection arthroplasty with antibiotic spacer 2016. She then had new knee placed and again resection and placement of a spacer.   Her Cx 10-2018 grew pan-sens pseudomonas, she was treated with cefepime however was re-admitted with AKI due to lack of IV  anbx, fluids from her SNF.  By August 2020, she had a MRI which did not show osteomyelitis, or septic hip but did show severe osteoarthritic changes of the sacroiliac joints bilaterally and also question of some hamstring tendinosis. Her inflammatory markers had improved at that point, and her cipro was d/c.   She had a repeat MRI Jan 2021 showing: Irregular enhancement along the margins of the tracks concerning for osteomyelitis versus residual postsurgical changes from arthroplasty removal. She had repeat MRI 06-2020 showing 1. Comparison with the previous study is limited by the smaller field of view and lack of contrast currently. No gross changes are seen. 2. Heterogeneous T2 hyperintensity within the marrow cavity of the distal femur and proximal tibia remains concerning for chronic osteomyelitis post removal of total knee arthroplasty. No progressive bone destruction seen. 3. Radiographic correlation recommended. Consider further evaluation with nuclear medicine tagged white blood cell scan.  Her last CRP was 3.7 and ESR 51 She returned to the OR today- she had exchange of spacer and multiple Cx sent.  She was started on cefepime/vanco post-operatively.  Her gram stains are (-)   Review of Systems: Review of Systems  Constitutional: Negative for  chills and fever.  Gastrointestinal: Negative for abdominal pain and diarrhea.  Genitourinary: Negative for dysuria.  Musculoskeletal: Positive for joint pain.  no wound breakdown.  Sugars have been "good".  Please see HPI. All other systems reviewed and negative.   Past Medical History:  Diagnosis Date  . Anemia   . Arthritis    Knee both knees  . Blood transfusion without reported diagnosis 2012   anemia;pt denies transfusion stated was only on iron tablet  . Cataract    left  . CKD (chronic kidney disease), stage III   . CVA (cerebral infarction)    2006  . Diabetes mellitus without complication ([place])   . Family history of  anesthesia complication    sister very slow to awaken after anesthesia;severe vomiting   . Gout    left elbow  . Groin pain 06/21/2019  . Hamstring tendinitis 08/22/2019  . Heart murmur   . 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 08-09-14 Right eye  . History of gout 01/04/2019  . Hyperlipidemia   . Hypertension   . Nocturia    3-4 times per night  . 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 Osteomyelitis of right tibia ([place]) 07/23/2020  . Other acute osteomyelitis, right femur ([place]) 07/23/2020  . 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 Stroke [facility]) 2006   x 1 no deficits noted     Social History   Tobacco Use  . Smoking status: Never Smoker  . Smokeless tobacco: Never Used  Vaping Use  . Vaping Use: Never used  Substance Use Topics  . Alcohol use: No  . Drug use: No    Family History  Problem Relation Age of Onset  . Ovarian cancer Mother   . Cancer Mother   . Diabetes Son   . Diabetes Son   . Peripheral vascular disease Father        with amputation of both legs  . Hypertension Father   . Heart disease Brother   . Kidney disease Daughter   . Colon cancer Neg Hx   . Esophageal cancer Neg Hx   . Stomach cancer Neg Hx   . Rectal cancer Neg Hx      Medications:  Scheduled: . acetaminophen  500 mg Oral Q6H  . [START ON 08/28/2020] amLODipine  10 mg Oral Daily  . atorvastatin  20 mg Oral QPM  . [START ON 08/28/2020] dexamethasone (DECADRON) injection  10 mg Intravenous Once  . [START ON 08/28/2020] diltiazem  240 mg Oral Daily  . docusate sodium  100 mg Oral BID  . fentaNYL      . [START ON 08/28/2020] hydrALAZINE  100 mg Oral Daily  . insulin aspart  0-15 Units Subcutaneous TID WC  . [START ON 08/28/2020] irbesartan  300 mg Oral Daily  . isosorbide dinitrate  10 mg Oral BID  . [START ON 08/28/2020] linagliptin  5 mg Oral Daily  . [START ON 08/28/2020] loratadine  10 mg Oral Daily  . [START ON 08/28/2020] nebivolol  20 mg Oral Daily  . [START ON  08/28/2020] rivaroxaban  10 mg Oral Q breakfast    Abtx:  Anti-infectives (From admission, onward)   Start     Dose/Rate Route Frequency Ordered Stop   08/28/20 1100  vancomycin (VANCOREADY) IVPB 1250 mg/250 mL        1,250 mg 166.7 mL/hr over 90 Minutes Intravenous Every 24 hours 08/27/20 1051     08/27/20 1200  ceFEPIme (MAXIPIME) 2 g  in sodium chloride 0.9 % 100 mL IVPB        2 g 200 mL/hr over 30 Minutes Intravenous Every 12 hours 08/27/20 0914     08/27/20 1100  vancomycin (VANCOREADY) IVPB 2000 mg/400 mL        2,000 mg 200 mL/hr over 120 Minutes Intravenous  Once 08/27/20 1051 08/27/20 1417   08/27/20 0600  ceFAZolin (ANCEF) IVPB 2g/100 mL premix        2 g 200 mL/hr over 30 Minutes Intravenous On call to O.R. 08/27/20 0531 08/27/20 0804        OBJECTIVE: Blood pressure (!) 144/70, pulse 66, temperature 97.7 F (36.5 C), temperature source Oral, resp. rate 16, SpO2 99 %.  Physical Exam Vitals reviewed.  Constitutional:      Appearance: Normal appearance. She is obese.  HENT:     Mouth/Throat:     Mouth: Mucous membranes are moist.     Pharynx: No oropharyngeal exudate.  Eyes:     Extraocular Movements: Extraocular movements intact.     Pupils: Pupils are equal, round, and reactive to light.  Cardiovascular:     Rate and Rhythm: Normal rate and regular rhythm.  Pulmonary:     Effort: Pulmonary effort is normal.     Breath sounds: Normal breath sounds.  Abdominal:     General: Bowel sounds are normal. There is no distension.     Palpations: Abdomen is soft.     Tenderness: There is no abdominal tenderness.  Musculoskeletal:     Cervical back: Normal range of motion and neck supple.     Right lower leg: Edema present.     Left lower leg: Edema present.     Comments: R knee is dressed.   Neurological:     Mental Status: She is alert.  Psychiatric:        Mood and Affect: Mood normal.     Lab Results Results for orders placed or performed during the  hospital encounter of 08/27/20 (from the past 48 hour(s))  Glucose, capillary     Status: Abnormal   Collection Time: 08/27/20  5:52 AM  Result Value Ref Range   Glucose-Capillary 134 (H) 70 - 99 mg/dL    Comment: Glucose reference range applies only to samples taken after fasting for at least 8 hours.   Comment 1 Notify RN    Comment 2 Document in Chart   Body fluid culture     Status: None (Preliminary result)   Collection Time: 08/27/20  7:47 AM   Specimen: KNEE  Result Value Ref Range   Specimen Description KNEE RIGHT SYNOVIAL FLUID    Special Requests NONE    Gram Stain      NO WBC SEEN NO ORGANISMS SEEN Gram Stain Report Called to,Read Back By and Verified With: GIGLIOTTI,K. RN @0903  ON 9.20.2021 BY [facility] Performed at [facility], [place] [address], [place], [place] [address]    Culture PENDING    Report Status PENDING   Body fluid culture     Status: None (Preliminary result)   Collection Time: 08/27/20  7:47 AM   Specimen: KNEE  Result Value Ref Range   Specimen Description KNEE RIGHT SYNOVIAL FLUID NUMBER 2    Special Requests NONE    Gram Stain      NO WBC SEEN NO ORGANISMS SEEN Gram Stain Report Called to,Read Back By and Verified With: GIGLIOTTI,K. RN @0903  ON 9.20.2021 BY [facility] Performed at [facility], [address] [place], [place] [address]    Culture PENDING    Report Status PENDING   Aerobic/Anaerobic Culture (surgical/deep wound)     Status: None (Preliminary result)   Collection Time: 08/27/20  8:02 AM   Specimen: PATH Soft tissue  Result Value Ref Range   Specimen Description      KNEE RIGHT SYNOVIAL TISSUE Performed at [place] [address], [place], [place] [address]    Special Requests      NONE Performed at [facility], [place] [address], [place], [place] [address]    Gram Stain      RARE WBC PRESENT, PREDOMINANTLY MONONUCLEAR NO ORGANISMS SEEN Performed at  [facility], [place] [address] [place], [place] [address]    Culture PENDING    Report Status PENDING   Aerobic/Anaerobic Culture (surgical/deep wound)     Status: None (Preliminary result)   Collection Time: 08/27/20  8:02 AM   Specimen: PATH Soft tissue  Result Value Ref Range   Specimen Description      KNEE RIGHT TISSUE TIBIAL CANAL Performed at [place] [address], [place], [place] [address]    Special Requests      NONE Performed at [facility], [place] [address] [place], [place] [address]    Gram Stain      FEW WBC PRESENT,BOTH PMN AND MONONUCLEAR NO ORGANISMS SEEN Performed at [facility], [place] [address] [place], [place] [address]    Culture PENDING    Report Status PENDING   Glucose, capillary     Status: Abnormal   Collection Time: 08/27/20  9:26 AM  Result Value Ref Range   Glucose-Capillary 192 (H) 70 - 99 mg/dL    Comment: Glucose reference range applies only to samples taken after fasting for at least 8 hours.   Comment 1 Notify RN    Comment 2 Document in Chart   Hemoglobin A1c     Status: Abnormal   Collection Time: 08/27/20 10:51 AM  Result Value Ref Range   Hgb A1c MFr Bld 5.8 (H) 4.8 - 5.6 %    Comment: (NOTE) Pre diabetes:          5.7%-6.4%  Diabetes:              >6.4%  Glycemic control for   <7.0% adults with diabetes    Mean Plasma Glucose 119.76 mg/dL    Comment: Performed at [place] [address], [place], [place] [address]  Glucose, capillary     Status: Abnormal   Collection Time: 08/27/20 11:54 AM  Result Value Ref Range   Glucose-Capillary 176 (H) 70 - 99 mg/dL    Comment: Glucose reference range applies only to samples taken after fasting for at least 8 hours.      Component Value Date/Time   SDES  08/27/2020 0802    KNEE RIGHT SYNOVIAL TISSUE Performed at [facility], [place] [address], [place], [place] [address]    SDES  08/27/2020 0802     KNEE RIGHT TISSUE TIBIAL CANAL Performed at [facility], [place] [address], [place], [place] [address]    SPECREQUEST  08/27/2020 0802    NONE Performed at [facility], [place] [address] [place], [place] [address]    SPECREQUEST  08/27/2020 0802    NONE Performed at [facility], [place] [address], [place],  [address]  CULT PENDING 08/27/2020 0802   CULT PENDING 08/27/2020 0802   REPTSTATUS PENDING 08/27/2020 0802   REPTSTATUS PENDING 08/27/2020 0802   No results found. Recent Results (from the past 240 hour(s))  SARS CORONAVIRUS 2 (TAT 6-24 HRS) Nasopharyngeal Nasopharyngeal Swab     Status: None   Collection Time: 08/23/20 11:00 AM   Specimen: Nasopharyngeal Swab  Result Value Ref Range Status   SARS Coronavirus 2 NEGATIVE NEGATIVE Final    Comment: (NOTE) SARS-CoV-2 target nucleic acids are NOT DETECTED.  The SARS-CoV-2 RNA is generally detectable in upper and lower respiratory specimens during the acute phase of infection. Negative results do not preclude SARS-CoV-2 infection, do not rule out co-infections with other pathogens, and should not be used as the sole basis for treatment or other patient management decisions. Negative results must be combined with clinical observations, patient history, and epidemiological information. The expected result is Negative.  Fact Sheet for Patients: SugarRoll.be  Fact Sheet for Healthcare Providers: https://www.woods-mathews.com/  This test is not yet approved or cleared by the [place] FDA and  has been authorized for detection and/or diagnosis of SARS-CoV-2 by FDA under an Emergency Use Authorization (EUA). This EUA will remain  in effect (meaning this test can be used) for the duration of the COVID-19 declaration under Se ction 564(b)(1) of the Act, 21 U.S.C. section 360bbb-3(b)(1), unless the authorization is terminated  or revoked sooner.  Performed at [facility], [place] [address] [place], [place] [address]   Body fluid culture     Status: None (Preliminary result)   Collection Time: 08/27/20  7:47 AM   Specimen: KNEE  Result Value Ref Range Status   Specimen Description KNEE RIGHT SYNOVIAL FLUID  Final   Special Requests NONE  Final   Gram Stain   Final    NO WBC SEEN NO ORGANISMS SEEN Gram Stain Report Called to,Read Back By and Verified With: GIGLIOTTI,K. RN @0903  ON 9.20.2021 BY [facility] Performed at [facility], [place] [address], [place], [place] [address]    Culture PENDING  Incomplete   Report Status PENDING  Incomplete  Body fluid culture     Status: None (Preliminary result)   Collection Time: 08/27/20  7:47 AM   Specimen: KNEE  Result Value Ref Range Status   Specimen Description KNEE RIGHT SYNOVIAL FLUID NUMBER 2  Final   Special Requests NONE  Final   Gram Stain   Final    NO WBC SEEN NO ORGANISMS SEEN Gram Stain Report Called to,Read Back By and Verified With: GIGLIOTTI,K. RN @0903  ON 9.20.2021 BY [facility] Performed at [facility], [place] [address] [place], [place] [address]    Culture PENDING  Incomplete   Report Status PENDING  Incomplete  Aerobic/Anaerobic Culture (surgical/deep wound)     Status: None (Preliminary result)   Collection Time: 08/27/20  8:02 AM   Specimen: PATH Soft tissue  Result Value Ref Range Status   Specimen Description   Final    KNEE RIGHT SYNOVIAL TISSUE Performed at [facility], [place] [address] [place], [place] [address]    Special Requests   Final    NONE Performed at [facility] [place], [place] [address] [place], [place] [address]    Gram Stain   Final    RARE WBC PRESENT, PREDOMINANTLY MONONUCLEAR NO ORGANISMS SEEN Performed at [facility], [place] [address] [place], [place] [address]    Culture PENDING  Incomplete   Report  Status PENDING  Incomplete   Aerobic/Anaerobic Culture (surgical/deep wound)     Status: None (Preliminary result)   Collection Time: 08/27/20  8:02 AM   Specimen: PATH Soft tissue  Result Value Ref Range Status   Specimen Description   Final    KNEE RIGHT TISSUE TIBIAL CANAL Performed at [place] [address] [place], [place] [address]    Special Requests   Final    NONE Performed at [facility], [place] [address] [place], [place] [address]    Gram Stain   Final    FEW WBC PRESENT,BOTH PMN AND MONONUCLEAR NO ORGANISMS SEEN Performed at [facility], [place] [address], [place], [place] [address]    Culture PENDING  Incomplete   Report Status PENDING  Incomplete    Microbiology: Recent Results (from the past 240 hour(s))  SARS CORONAVIRUS 2 (TAT 6-24 HRS) Nasopharyngeal Nasopharyngeal Swab     Status: None   Collection Time: 08/23/20 11:00 AM   Specimen: Nasopharyngeal Swab  Result Value Ref Range Status   SARS Coronavirus 2 NEGATIVE NEGATIVE Final    Comment: (NOTE) SARS-CoV-2 target nucleic acids are NOT DETECTED.  The SARS-CoV-2 RNA is generally detectable in upper and lower respiratory specimens during the acute phase of infection. Negative results do not preclude SARS-CoV-2 infection, do not rule out co-infections with other pathogens, and should not be used as the sole basis for treatment or other patient management decisions. Negative results must be combined with clinical observations, patient history, and epidemiological information. The expected result is Negative.  Fact Sheet for Patients: SugarRoll.be  Fact Sheet for Healthcare Providers: https://www.woods-mathews.com/  This test is not yet approved or cleared by the [place] FDA and  has been authorized for detection and/or diagnosis of SARS-CoV-2 by FDA under an Emergency Use Authorization (EUA). This EUA will remain  in effect (meaning  this test can be used) for the duration of the COVID-19 declaration under Se ction 564(b)(1) of the Act, 21 U.S.C. section 360bbb-3(b)(1), unless the authorization is terminated or revoked sooner.  Performed at [facility], [place] [address], [place], [place] [address]   Body fluid culture     Status: None (Preliminary result)   Collection Time: 08/27/20  7:47 AM   Specimen: KNEE  Result Value Ref Range Status   Specimen Description KNEE RIGHT SYNOVIAL FLUID  Final   Special Requests NONE  Final   Gram Stain   Final    NO WBC SEEN NO ORGANISMS SEEN Gram Stain Report Called to,Read Back By and Verified With: GIGLIOTTI,K. RN @0903  ON 9.20.2021 BY [facility] Performed at [facility], [place] [address] [place], [place] [address]    Culture PENDING  Incomplete   Report Status PENDING  Incomplete  Body fluid culture     Status: None (Preliminary result)   Collection Time: 08/27/20  7:47 AM   Specimen: KNEE  Result Value Ref Range Status   Specimen Description KNEE RIGHT SYNOVIAL FLUID NUMBER 2  Final   Special Requests NONE  Final   Gram Stain   Final    NO WBC SEEN NO ORGANISMS SEEN Gram Stain Report Called to,Read Back By and Verified With: GIGLIOTTI,K. RN @0903  ON 9.20.2021 BY NMCCOY Performed at [facility], [place] [address] [place],  [address]    Culture PENDING  Incomplete   Report Status PENDING  Incomplete  Aerobic/Anaerobic Culture (surgical/deep wound)     Status: None (Preliminary  result)   Collection Time: 08/27/20  8:02 AM   Specimen: PATH Soft tissue  Result Value Ref Range Status   Specimen Description   Final    KNEE RIGHT SYNOVIAL TISSUE Performed at [place] [address] [place], [place] [address]    Special Requests   Final    NONE Performed at [facility], [place] [address] [place], [place] [address]    Gram Stain   Final    RARE WBC PRESENT, PREDOMINANTLY  MONONUCLEAR NO ORGANISMS SEEN Performed at [facility], [place] [address] [place], [place] [address]    Culture PENDING  Incomplete   Report Status PENDING  Incomplete  Aerobic/Anaerobic Culture (surgical/deep wound)     Status: None (Preliminary result)   Collection Time: 08/27/20  8:02 AM   Specimen: PATH Soft tissue  Result Value Ref Range Status   Specimen Description   Final    KNEE RIGHT TISSUE TIBIAL CANAL Performed at [place] [address] [place], [place] [address]    Special Requests   Final    NONE Performed at [facility], [place] [address], [place], [place] [address]    Gram Stain   Final    FEW WBC PRESENT,BOTH PMN AND MONONUCLEAR NO ORGANISMS SEEN Performed at [facility], [place] [address], [place], [place] [address]    Culture PENDING  Incomplete   Report Status PENDING  Incomplete    Radiographs and labs were personally reviewed by me.   Bobby Rumpf, MD [facility] [phone] 08/27/2020, 3:44 PM

## 2020-08-27 NOTE — Progress Notes (Signed)
[facility] staff acknowledge consult and following for discharge needs.

## 2020-08-27 NOTE — Anesthesia Procedure Notes (Signed)
Procedure Name: LMA Insertion Date/Time: 08/27/2020 7:17 AM Performed by: Montel Clock, CRNA Pre-anesthesia Checklist: Patient identified, Emergency Drugs available, Suction available, Patient being monitored and Timeout performed Patient Re-evaluated:Patient Re-evaluated prior to induction Oxygen Delivery Method: Circle system utilized Preoxygenation: Pre-oxygenation with 100% oxygen Induction Type: IV induction LMA: LMA with gastric port inserted LMA Size: 4.0 Number of attempts: 1 Dental Injury: Teeth and Oropharynx as per pre-operative assessment

## 2020-08-27 NOTE — Evaluation (Signed)
Physical Therapy Evaluation Patient Details Name: Beth [name] MRN: [id number] DOB: 13-May-1946 Today's Date: 08/27/2020   History of Present Illness  s/p R knee I & D with spacer exchange. PMH: DM, HTN, CVA, multiple knee surgeries  Clinical Impression  Pt is s/p TKA resulting in the deficits listed below (see PT Problem List).  Pt able to stand EOB with mod assist +2.  Lateral steps along EOB, assisted pt back to bed d/t concern for pt being able to safely stand from lower surface of chair with assist of nursing for  back to bed.  Will continue to follow   Pt will benefit from skilled PT to increase their independence and safety with mobility to allow discharge to the venue listed below.      Follow Up Recommendations Follow surgeon's recommendation for DC plan and follow-up therapies    Equipment Recommendations  Rolling walker with 5" wheels;3in1 (PT)    Recommendations for Other Services       Precautions / Restrictions Precautions Precautions: Knee;Fall Required Braces or Orthoses: Knee Immobilizer - Right Restrictions Weight Bearing Restrictions: No      Mobility  Bed Mobility Overal bed mobility: Needs Assistance Bed Mobility: Supine to Sit;Sit to Supine     Supine to sit: Mod assist Sit to supine: Mod assist   General bed mobility comments: assist for bil LEs and trunk, incr time  Transfers Overall transfer level: Needs assistance Equipment used: Rolling walker (2 wheeled) Transfers: Sit to/from Stand Sit to Stand: Mod assist;+2 safety/equipment;+2 physical assistance;From elevated surface         General transfer comment: assist for anterior superior wt translation, incr time  Ambulation/Gait             General Gait Details: lateral steps along EOB with min-mod assist  Stairs            Wheelchair Mobility    Modified Rankin (Stroke Patients Only)       Balance Overall balance assessment: Needs assistance   Sitting balance-Leahy  Scale: Fair     Standing balance support: Bilateral upper extremity supported Standing balance-[name]: Poor                               Pertinent Vitals/Pain Pain Assessment: 0-10 Pain Score: 2  Pain Location: right knee Pain Descriptors / Indicators: Discomfort Pain Intervention(s): Limited activity within patient's tolerance;Monitored during session;Repositioned    Home Living Family/patient expects to be discharged to:: Private residence Living Arrangements: Alone Available Help at Discharge: Family;Available 24 hours/day Type of Home: House Home Access: Ramped entrance     Home Layout: One level Home Equipment: Walker - 2 wheels;Cane - single point;Bedside commode;Wheelchair - manual Additional Comments: needs new RW and  BSC    Prior Function Level of Independence: Needs assistance   Gait / Transfers Assistance Needed: amb  with RW           Hand Dominance        Extremity/Trunk Assessment   Upper Extremity Assessment Upper Extremity Assessment: Overall WFL for tasks assessed    Lower Extremity Assessment Lower Extremity Assessment: Generalized weakness RLE Deficits / Details: knee extension and hip flexion 2+/5 RLE: Unable to fully assess due to immobilization       Communication   Communication: No difficulties  Cognition Arousal/Alertness: Awake/alert Behavior During Therapy: WFL for tasks assessed/performed Overall Cognitive Status: Within Functional Limits for tasks assessed  General Comments      Exercises     Assessment/Plan    PT Assessment Patient needs continued PT services  PT Problem List Decreased strength;Decreased activity tolerance;Decreased mobility;Pain;Decreased knowledge of use of DME;Decreased range of motion       PT Treatment Interventions DME instruction;Therapeutic exercise;Gait training;Functional mobility training;Therapeutic  activities;Patient/family education    PT Goals (Current goals can be found in the Care Plan section)  Acute Rehab PT Goals Patient Stated Goal: home, be able to PT Goal Formulation: With patient Time For Goal Achievement: 09/03/20 Potential to Achieve Goals: Good    Frequency 7X/week   Barriers to discharge        Co-evaluation               AM-PAC PT "6 Clicks" Mobility  Outcome Measure Help needed turning from your back to your side while in a flat bed without using bedrails?: A Lot Help needed moving from lying on your back to sitting on the side of a flat bed without using bedrails?: A Lot Help needed moving to and from a bed to a chair (including a wheelchair)?: A Lot Help needed standing up from a chair using your arms (e.g., wheelchair or bedside chair)?: A Lot Help needed to walk in hospital room?: Total Help needed climbing 3-5 steps with a railing? : Total 6 Click Score: 10    End of Session Equipment Utilized During Treatment: Gait belt;Right knee immobilizer Activity Tolerance: Patient tolerated treatment well Patient left: with call bell/phone within reach;with bed alarm set;in bed;with family/visitor present   PT Visit Diagnosis: Difficulty in walking, not elsewhere classified (R26.2)    Time: 5397-6734 PT Time Calculation (min) (ACUTE ONLY): 32 min   Charges:   PT Evaluation $PT Eval Low Complexity: 1 Low PT Treatments $Gait Training: 8-22 mins        Baxter Flattery, PT  Acute Rehab Dept ([place]) [phone] Pager [phone]  08/27/2020   [facility] 08/27/2020, 5:04 PM

## 2020-08-27 NOTE — Anesthesia Procedure Notes (Signed)
Anesthesia Regional Block: Adductor canal block   Pre-Anesthetic Checklist: ,, timeout performed, Correct Patient, Correct Site, Correct Laterality, Correct Procedure, Correct Position, site marked, Risks and benefits discussed,  Surgical consent,  Pre-op evaluation,  At surgeon's request and post-op pain management  Laterality: Right  Prep: chloraprep       Needles:  Injection technique: Single-shot  Needle Type: Echogenic Stimulator Needle     Needle Length: 10cm  Needle Gauge: 20     Additional Needles:   Procedures:,,,, ultrasound used (permanent image in chart),,,,  Narrative:  Start time: 08/27/2020 6:55 AM End time: 08/27/2020 7:00 AM Injection made incrementally with aspirations every 5 mL.  Performed by: Personally  Anesthesiologist: Merlinda Frederick, MD  Additional Notes: A functioning IV was confirmed and monitors were applied.  Sterile prep and drape, hand hygiene and sterile gloves were used.  Negative aspiration and test dose prior to incremental administration of local anesthetic. The patient tolerated the procedure well.Ultrasound  guidance: relevant anatomy identified, needle position confirmed, local anesthetic spread visualized around nerve(s), vascular puncture avoided.  Image printed for medical record.

## 2020-08-27 NOTE — Progress Notes (Signed)
Pharmacy: Re- abx for knee infection  Patient was started on cefepime this morning for right knee infection (see Raquel Sarna Sinclair's progress note).  Pharmacy is consulted to add vancomycin to abx regimen.  - scr 0.92 on 9/14  Plan: - vancomycin 2000 mg IV x1, then 1250 mg IV q24h - continue cefepime 2gm q12h for now - f/u with scr on 9/21  Dia Sitter, PharmD, BCPS 08/27/2020 10:50 AM

## 2020-08-27 NOTE — Interval H&P Note (Signed)
History and Physical Interval Note:  08/27/2020 6:37 AM  Beth [name]  has presented today for surgery, with the diagnosis of Septic arthritis right knee.  The various methods of treatment have been discussed with the patient and family. After consideration of risks, benefits and other options for treatment, the patient has consented to  Procedure(s) with comments: IRRIGATION AND DEBRIDEMENT; SPACER EXCHANGE RIGHT KNEE (Right) - 89min as a surgical intervention.  The patient's history has been reviewed, patient examined, no change in status, stable for surgery.  I have reviewed the patient's chart and labs.  Questions were answered to the patient's satisfaction.     Pilar Plate Reise Gladney

## 2020-08-27 NOTE — Discharge Instructions (Addendum)
[name], MD [facility] [address] [place], [place] [address] [phone]  POSTOPERATIVE DIRECTIONS  BLOOD CLOT PREVENTION . Take a 10 mg Xarelto once a day for three weeks following surgery. Then take an 81 mg Aspirin once a day for three weeks. Then discontinue Aspirin. . You may resume your vitamins/supplements once you have discontinued the Xarelto. . Do not take any NSAIDs (Advil, Aleve, Ibuprofen, Meloxicam, etc.) until you have discontinued the Xarelto.   HOME CARE INSTRUCTIONS  . Remove items at home which could result in a fall. This includes throw rugs or furniture in walking pathways.  . ICE to the affected knee as much as tolerated. Icing helps control swelling. If the swelling is well controlled you will be more comfortable and rehab easier. Continue to use ice on the knee for pain and swelling from surgery. You may notice swelling that will progress down to the foot and ankle. This is normal after surgery. Elevate the leg when you are not up walking on it.    . Continue to use the breathing machine which will help keep your temperature down. It is common for your temperature to cycle up and down following surgery, especially at night when you are not up moving around and exerting yourself. The breathing machine keeps your lungs expanded and your temperature down. . Do not place pillow under the operative knee, focus on keeping the knee straight while resting  DIET You may resume your previous home diet once you are discharged from the hospital.  DRESSING / [place] / SHOWERING . Keep your bulky bandage on for 2 days. On the third post-operative day you may remove the Ace bandage and gauze. There is a waterproof adhesive bandage on your skin which will stay in place until your first follow-up appointment. Once you remove this you will not need to place another bandage . You may begin showering 3 days following  surgery, but do not submerge the incision under water.  TED HOSE STOCKINGS Wear the elastic stockings on both legs for three weeks following surgery during the day. You may remove them at night for sleeping.  WEIGHT BEARING Weight bearing as tolerated with assist device (walker, cane, etc) as directed, use it as long as suggested by your surgeon or therapist, typically at least 4-6 weeks.  POSTOPERATIVE CONSTIPATION PROTOCOL Constipation - defined medically as fewer than three stools per week and severe constipation as less than one stool per week.  One of the most common issues patients have following surgery is constipation.  Even if you have a regular bowel pattern at home, your normal regimen is likely to be disrupted due to multiple reasons following surgery.  Combination of anesthesia, postoperative narcotics, change in appetite and fluid intake all can affect your bowels.  In order to avoid complications following surgery, here are some recommendations in order to help you during your recovery period.  . Colace (docusate) - Pick up an over-the-counter form of Colace or another stool softener and take twice a day as long as you are requiring postoperative pain medications.  Take with a full glass of water daily.  If you experience loose stools or diarrhea, hold the colace until you stool forms back up. If your symptoms do not get better within 1 week or if they get worse, check with your doctor. . Dulcolax (bisacodyl) - Pick up over-the-counter and take as directed by the product packaging as needed to assist  with the movement of your bowels.  Take with a full glass of water.  Use this product as needed if not relieved by Colace only.  . MiraLax (polyethylene glycol) - Pick up over-the-counter to have on hand. MiraLax is a solution that will increase the amount of water in your bowels to assist with bowel movements.  Take as directed and can mix with a glass of water, juice, soda, coffee, or  tea. Take if you go more than two days without a movement. Do not use MiraLax more than once per day. Call your doctor if you are still constipated or irregular after using this medication for 7 days in a row.  If you continue to have problems with postoperative constipation, please contact the office for further assistance and recommendations.  If you experience "the worst abdominal pain ever" or develop nausea or vomiting, please contact the office immediatly for further recommendations for treatment.  ITCHING If you experience itching with your medications, try taking only a single pain pill, or even half a pain pill at a time.  You can also use Benadryl over the counter for itching or also to help with sleep.   MEDICATIONS See your medication summary on the "After Visit Summary" that the nursing staff will review with you prior to discharge.  You may have some home medications which will be placed on hold until you complete the course of blood thinner medication.  It is important for you to complete the blood thinner medication as prescribed by your surgeon.  Continue your approved medications as instructed at time of discharge.  PRECAUTIONS . If you experience chest pain or shortness of breath - call 911 immediately for transfer to the hospital emergency department.  . If you develop a fever greater that 101 F, purulent drainage from wound, increased redness or drainage from wound, foul odor from the wound/dressing, or calf pain - CONTACT YOUR SURGEON.                                                   FOLLOW-UP APPOINTMENTS Make sure you keep all of your appointments after your operation with your surgeon and caregivers. You should call the office at the above phone number and make an appointment for approximately two weeks after the date of your surgery or on the date instructed by your surgeon outlined in the "After Visit Summary".  IF YOU ARE TRANSFERRED TO A SKILLED REHAB FACILITY If the  patient is transferred to a skilled rehab facility following release from the hospital, a list of the current medications will be sent to the facility for the patient to continue.  When discharged from the skilled rehab facility, please have the facility set up the patient's [place] prior to being released. Also, the skilled facility will be responsible for providing the patient with their medications at time of release from the facility to include their pain medication, the muscle relaxants, and their blood thinner medication. If the patient is still at the rehab facility at time of the two week follow up appointment, the skilled rehab facility will also need to assist the patient in arranging follow up appointment in our office and any transportation needs.  MAKE SURE YOU:  . Understand these instructions.  . Get help right away if you are not doing well or  get worse.   Pick up stool softner and laxative for home use following surgery while on pain medications. Do not submerge incision under water. Please use good hand washing techniques while changing dressing each day. May shower starting three days after surgery. Please use a clean towel to pat the incision dry following showers. Continue to use ice for pain and swelling after surgery. Do not use any lotions or creams on the incision until instructed by your surgeon.  ____________________________________________________________________________________________________________________________   Information on my medicine - XARELTO (Rivaroxaban)    Why was Xarelto prescribed for you? Xarelto was prescribed for you to reduce the risk of blood clots forming. The medical term for these abnormal blood clots is venous thromboembolism (VTE).  What do you need to know about xarelto ? Take your Xarelto ONCE DAILY at the same time every day. You may take it either with or without food.  If you have difficulty swallowing  the tablet whole, you may crush it and mix in applesauce just prior to taking your dose.  Take Xarelto exactly as prescribed by your doctor and DO NOT stop taking Xarelto without talking to the doctor who prescribed the medication.  Stopping without other VTE prevention medication to take the place of Xarelto may increase your risk of developing a clot.  After discharge, you should have regular check-up appointments with your healthcare provider that is prescribing your Xarelto.    What do you do if you miss a dose? If you miss a dose, take it as soon as you remember on the same day then continue your regularly scheduled once daily regimen the next day. Do not take two doses of Xarelto on the same day.   Important Safety Information A possible side effect of Xarelto is bleeding. You should call your healthcare provider right away if you experience any of the following: ? Bleeding from an injury or your nose that does not stop. ? Unusual colored urine (red or dark brown) or unusual colored stools (red or black). ? Unusual bruising for unknown reasons. ? A serious fall or if you hit your head (even if there is no bleeding).  Some medicines may interact with Xarelto and might increase your risk of bleeding while on Xarelto. To help avoid this, consult your healthcare provider or pharmacist prior to using any new prescription or non-prescription medications, including herbals, vitamins, non-steroidal anti-inflammatory drugs (NSAIDs) and supplements.  This website has more information on Xarelto: [email or web address]

## 2020-08-27 NOTE — Transfer of Care (Signed)
Immediate Anesthesia Transfer of Care Note  Patient: Beth [name]  Procedure(s) Performed: IRRIGATION AND DEBRIDEMENT; SPACER EXCHANGE RIGHT KNEE with multiple specimens (Right Knee)  Patient Location: PACU  Anesthesia Type:GA combined with regional for post-op pain  Level of Consciousness: drowsy and patient cooperative  Airway & Oxygen Therapy: Patient Spontanous Breathing and Patient connected to face mask oxygen  Post-op Assessment: Report given to RN and Post -op Vital signs reviewed and stable  Post vital signs: Reviewed and stable  Last Vitals:  Vitals Value Taken Time  BP 130/68 08/27/20 0906  Temp    Pulse 62 08/27/20 0907  Resp 23 08/27/20 0907  SpO2 97 % 08/27/20 0907  Vitals shown include unvalidated device data.  Last Pain:  Vitals:   08/27/20 0619  TempSrc: Oral         Complications: No complications documented.

## 2020-08-28 ENCOUNTER — Encounter (HOSPITAL_COMMUNITY): Payer: Self-pay | Admitting: Orthopedic Surgery

## 2020-08-28 DIAGNOSIS — M549 Dorsalgia, unspecified: Secondary | ICD-10-CM | POA: Diagnosis not present

## 2020-08-28 DIAGNOSIS — T8453XA Infection and inflammatory reaction due to internal right knee prosthesis, initial encounter: Secondary | ICD-10-CM | POA: Diagnosis present

## 2020-08-28 DIAGNOSIS — E785 Hyperlipidemia, unspecified: Secondary | ICD-10-CM | POA: Diagnosis present

## 2020-08-28 DIAGNOSIS — Z6834 Body mass index (BMI) 34.0-34.9, adult: Secondary | ICD-10-CM | POA: Diagnosis not present

## 2020-08-28 DIAGNOSIS — N183 Chronic kidney disease, stage 3 unspecified: Secondary | ICD-10-CM | POA: Diagnosis present

## 2020-08-28 DIAGNOSIS — Z833 Family history of diabetes mellitus: Secondary | ICD-10-CM | POA: Diagnosis not present

## 2020-08-28 DIAGNOSIS — M869 Osteomyelitis, unspecified: Secondary | ICD-10-CM | POA: Diagnosis not present

## 2020-08-28 DIAGNOSIS — M461 Sacroiliitis, not elsewhere classified: Secondary | ICD-10-CM | POA: Diagnosis present

## 2020-08-28 DIAGNOSIS — I129 Hypertensive chronic kidney disease with stage 1 through stage 4 chronic kidney disease, or unspecified chronic kidney disease: Secondary | ICD-10-CM | POA: Diagnosis present

## 2020-08-28 DIAGNOSIS — R32 Unspecified urinary incontinence: Secondary | ICD-10-CM | POA: Diagnosis not present

## 2020-08-28 DIAGNOSIS — B965 Pseudomonas (aeruginosa) (mallei) (pseudomallei) as the cause of diseases classified elsewhere: Secondary | ICD-10-CM | POA: Diagnosis present

## 2020-08-28 DIAGNOSIS — L989 Disorder of the skin and subcutaneous tissue, unspecified: Secondary | ICD-10-CM | POA: Diagnosis not present

## 2020-08-28 DIAGNOSIS — Z8673 Personal history of transient ischemic attack (TIA), and cerebral infarction without residual deficits: Secondary | ICD-10-CM | POA: Diagnosis not present

## 2020-08-28 DIAGNOSIS — Z4789 Encounter for other orthopedic aftercare: Secondary | ICD-10-CM | POA: Diagnosis not present

## 2020-08-28 DIAGNOSIS — M009 Pyogenic arthritis, unspecified: Secondary | ICD-10-CM | POA: Diagnosis present

## 2020-08-28 DIAGNOSIS — Y831 Surgical operation with implant of artificial internal device as the cause of abnormal reaction of the patient, or of later complication, without mention of misadventure at the time of the procedure: Secondary | ICD-10-CM | POA: Diagnosis present

## 2020-08-28 DIAGNOSIS — Z8249 Family history of ischemic heart disease and other diseases of the circulatory system: Secondary | ICD-10-CM | POA: Diagnosis not present

## 2020-08-28 DIAGNOSIS — E1122 Type 2 diabetes mellitus with diabetic chronic kidney disease: Secondary | ICD-10-CM | POA: Diagnosis present

## 2020-08-28 DIAGNOSIS — M109 Gout, unspecified: Secondary | ICD-10-CM | POA: Diagnosis present

## 2020-08-28 DIAGNOSIS — Z8041 Family history of malignant neoplasm of ovary: Secondary | ICD-10-CM | POA: Diagnosis not present

## 2020-08-28 LAB — CBC
HCT: 31.5 % — ABNORMAL LOW (ref 36.0–46.0)
Hemoglobin: 9.3 g/dL — ABNORMAL LOW (ref 12.0–15.0)
MCH: 25.6 pg — ABNORMAL LOW (ref 26.0–34.0)
MCHC: 29.5 g/dL — ABNORMAL LOW (ref 30.0–36.0)
MCV: 86.8 fL (ref 80.0–100.0)
Platelets: 262 10*3/uL (ref 150–400)
RBC: 3.63 MIL/uL — ABNORMAL LOW (ref 3.87–5.11)
RDW: 15.9 % — ABNORMAL HIGH (ref 11.5–15.5)
WBC: 10.8 10*3/uL — ABNORMAL HIGH (ref 4.0–10.5)
nRBC: 0 % (ref 0.0–0.2)

## 2020-08-28 LAB — GLUCOSE, CAPILLARY
Glucose-Capillary: 132 mg/dL — ABNORMAL HIGH (ref 70–99)
Glucose-Capillary: 176 mg/dL — ABNORMAL HIGH (ref 70–99)
Glucose-Capillary: 210 mg/dL — ABNORMAL HIGH (ref 70–99)
Glucose-Capillary: 279 mg/dL — ABNORMAL HIGH (ref 70–99)

## 2020-08-28 LAB — BASIC METABOLIC PANEL
Anion gap: 10 (ref 5–15)
BUN: 21 mg/dL (ref 8–23)
CO2: 22 mmol/L (ref 22–32)
Calcium: 8.7 mg/dL — ABNORMAL LOW (ref 8.9–10.3)
Chloride: 105 mmol/L (ref 98–111)
Creatinine, Ser: 0.84 mg/dL (ref 0.44–1.00)
GFR calc Af Amer: >60 mL/min (ref >60)
GFR calc non Af Amer: >60 mL/min (ref >60)
Glucose, Bld: 191 mg/dL — ABNORMAL HIGH (ref 70–99)
Potassium: 4 mmol/L (ref 3.5–5.1)
Sodium: 137 mmol/L (ref 135–145)

## 2020-08-28 MED ORDER — HYDROCODONE-ACETAMINOPHEN 5-325 MG PO TABS: 1.0000 | ORAL_TABLET | Freq: Four times a day (QID) | ORAL | 0 refills | Status: DC | PRN

## 2020-08-28 MED ORDER — RIVAROXABAN 10 MG PO TABS: 10.0000 mg | ORAL_TABLET | Freq: Every day | ORAL | 0 refills | Status: DC

## 2020-08-28 MED ORDER — METHOCARBAMOL 500 MG PO TABS: 500.0000 mg | ORAL_TABLET | Freq: Four times a day (QID) | ORAL | 0 refills | Status: DC | PRN

## 2020-08-28 MED ORDER — TRAMADOL HCL 50 MG PO TABS: 50.0000 mg | ORAL_TABLET | Freq: Four times a day (QID) | ORAL | 0 refills | Status: DC | PRN

## 2020-08-28 MED ORDER — LEVOFLOXACIN 500 MG PO TABS: 500.0000 mg | ORAL_TABLET | Freq: Every day | ORAL | 0 refills | Status: AC

## 2020-08-28 NOTE — Progress Notes (Signed)
Physical Therapy Treatment Patient Details Name: Beth [name] MRN: [id number] DOB: 04/12/1946 Today's Date: 08/28/2020    History of Present Illness s/p R knee I & D with spacer exchange. PMH: DM, HTN, CVA, multiple knee surgeries    PT Comments    Pt progressing toward goals. Continues to require assist of 2 for transfers. Will continue to follow    Follow Up Recommendations  Follow surgeon's recommendation for DC plan and follow-up therapies     Equipment Recommendations  Rolling walker with 5" wheels;3in1 (PT)    Recommendations for Other Services       Precautions / Restrictions Precautions Precautions: Knee;Fall Required Braces or Orthoses: Knee Immobilizer - Right Restrictions Weight Bearing Restrictions: No    Mobility  Bed Mobility   Bed Mobility: Sit to Supine;Rolling Rolling: Min assist     Sit to supine: Mod assist   General bed mobility comments: assist with bil LEs, incr time   Transfers Overall transfer level: Needs assistance Equipment used: Rolling walker (2 wheeled) Transfers: Sit to/from [facility] Sit to Stand: +2 physical assistance;Mod assist Stand pivot transfers: Min assist;+2 physical assistance;+2 safety/equipment       General transfer comment: assist for anterior superior wt translation, incr time. cues to push from chair arms  Ambulation/Gait Ambulation/Gait assistance: Min assist Gait Distance (Feet): 5 Feet Assistive device: Rolling walker (2 wheeled) Gait Pattern/deviations: Step-to pattern;Decreased stance time - right     General Gait Details: cues for sequence, trunk and hip extension   Stairs             Wheelchair Mobility    Modified Rankin (Stroke Patients Only)       Balance           Standing balance support: Bilateral upper extremity supported Standing balance-[name]: Poor                              Cognition Arousal/Alertness: Awake/alert Behavior  During Therapy: WFL for tasks assessed/performed Overall Cognitive Status: Within Functional Limits for tasks assessed                                        Exercises      General Comments        Pertinent Vitals/Pain Pain Assessment: Faces Faces Pain Scale: Hurts even more Pain Location: right knee Pain Descriptors / Indicators: Discomfort;Grimacing;Sore Pain Intervention(s): Limited activity within patient's tolerance;Monitored during session;Premedicated before session;Repositioned    Home Living                      Prior Function            PT Goals (current goals can now be found in the care plan section) Acute Rehab PT Goals Patient Stated Goal: home, be able to PT Goal Formulation: With patient Time For Goal Achievement: 09/03/20 Potential to Achieve Goals: Good Progress towards PT goals: Progressing toward goals    Frequency    7X/week      PT Plan Current plan remains appropriate    Co-evaluation              AM-PAC PT "6 Clicks" Mobility   Outcome Measure  Help needed turning from your back to your side while in a flat bed without using bedrails?: A Lot Help needed moving from  lying on your back to sitting on the side of a flat bed without using bedrails?: A Lot Help needed moving to and from a bed to a chair (including a wheelchair)?: A Lot Help needed standing up from a chair using your arms (e.g., wheelchair or bedside chair)?: A Lot Help needed to walk in hospital room?: Total Help needed climbing 3-5 steps with a railing? : Total 6 Click Score: 10    End of Session Equipment Utilized During Treatment: Gait belt;Right knee immobilizer Activity Tolerance: Patient tolerated treatment well Patient left: in chair;with call bell/phone within reach;with chair alarm set;with family/visitor present Nurse Communication: Mobility status PT Visit Diagnosis: Difficulty in walking, not elsewhere classified (R26.2)      Time: 1610-9604 PT Time Calculation (min) (ACUTE ONLY): 25 min  Charges:  $Therapeutic Activity: 23-37 mins                     Baxter Flattery, PT  Acute Rehab Dept ([place]) [phone] Pager [phone]  08/28/2020    [facility] 08/28/2020, 2:40 PM

## 2020-08-28 NOTE — Progress Notes (Signed)
Physical Therapy Treatment Patient Details Name: Beth [name] MRN: [id number] DOB: 1946/05/09 Today's Date: 08/28/2020    History of Present Illness s/p R knee I & D with spacer exchange. PMH: DM, HTN, CVA, multiple knee surgeries    PT Comments    Pt progressing toward goals. incr gait distance however still requiring 2 person assist and incr time for all tasks. Will likely need another day to meet goals and allow safe return home,   Follow Up Recommendations  Follow surgeons recommendation for DC plan and follow-up therapies     Equipment Recommendations  Rolling walker with 5" wheels;3in1 (PT)    Recommendations for Other Services       Precautions / Restrictions Precautions Precautions: Knee;Fall Required Braces or Orthoses: Knee Immobilizer - Right Restrictions Weight Bearing Restrictions: No    Mobility  Bed Mobility   Bed Mobility: Supine to Sit     Supine to sit: HOB elevated;Min assist     General bed mobility comments: assist to elevate trunk safely, incr time   Transfers Overall transfer level: Needs assistance Equipment used: Rolling walker (2 wheeled) Transfers: Sit to/from Stand Sit to Stand: Min assist;+2 physical assistance;From elevated surface         General transfer comment: assist for anterior superior wt translation, incr time. pt prefers to push up on RW   Ambulation/Gait Ambulation/Gait assistance: Min assist Gait Distance (Feet): 22 Feet Assistive device: Rolling walker (2 wheeled) Gait Pattern/deviations: Step-to pattern;Decreased stance time - right     General Gait Details: cues for sequence, trunk and hip extension    Stairs             Wheelchair Mobility    Modified Rankin (Stroke Patients Only)       Balance           Standing balance support: Bilateral upper extremity supported Standing balance-[name]: Poor                              Cognition Arousal/Alertness:  Awake/alert Behavior During Therapy: WFL for tasks assessed/performed Overall Cognitive Status: Within Functional Limits for tasks assessed                                        Exercises Total Joint Exercises Ankle Circles/Pumps: AROM;Both;5 reps    General Comments        Pertinent Vitals/Pain Pain Assessment: Faces Faces Pain Scale: Hurts even more Pain Location: right knee Pain Descriptors / Indicators: Discomfort;Grimacing;Sore Pain Intervention(s): Limited activity within patient's tolerance;Monitored during session;Premedicated before session;Repositioned;Ice applied    Home Living                      Prior Function            PT Goals (current goals can now be found in the care plan section) Acute Rehab PT Goals Patient Stated Goal: home, be able to PT Goal Formulation: With patient Time For Goal Achievement: 09/03/20 Potential to Achieve Goals: Good Progress towards PT goals: Progressing toward goals    Frequency    7X/week      PT Plan Current plan remains appropriate    Co-evaluation              AM-PAC PT "6 Clicks" Mobility   Outcome Measure  Help needed turning from  your back to your side while in a flat bed without using bedrails?: A Lot Help needed moving from lying on your back to sitting on the side of a flat bed without using bedrails?: A Lot Help needed moving to and from a bed to a chair (including a wheelchair)?: A Lot Help needed standing up from a chair using your arms (e.g., wheelchair or bedside chair)?: A Lot Help needed to walk in hospital room?: Total Help needed climbing 3-5 steps with a railing? : Total 6 Click Score: 10    End of Session Equipment Utilized During Treatment: Gait belt;Right knee immobilizer Activity Tolerance: Patient tolerated treatment well Patient left: in chair;with call bell/phone within reach;with chair alarm set;with family/visitor present Nurse Communication: Mobility  status PT Visit Diagnosis: Difficulty in walking, not elsewhere classified (R26.2)     Time: 1610-9604 PT Time Calculation (min) (ACUTE ONLY): 18 min  Charges:  $Gait Training: 8-22 mins                     Baxter Flattery, PT  Acute Rehab Dept ([place]) [phone] Pager [phone]  08/28/2020    [facility] 08/28/2020, 10:59 AM

## 2020-08-28 NOTE — Progress Notes (Signed)
PT NOTE  Due to pt body habitus and mobility deficits she will need a wide/bari RW and a wide 3in1 for home use. Pt is unsafe with standard sized DME at this time.   Beth [name], PT  Acute Rehab Dept [facility]) [phone] Pager [phone]  08/28/2020

## 2020-08-28 NOTE — Progress Notes (Signed)
Subjective: 1 Day Post-Op Procedure(s) (LRB): IRRIGATION AND DEBRIDEMENT; SPACER EXCHANGE RIGHT KNEE with multiple specimens (Right) Patient reports pain as mild.   Plan is to go Home after hospital stay. Beth [name] has decided that she does not want to have a PICC line placed as she has had them in the past and feels that it will be too difficult to deal with. She understands that the PICC line is necessary for the IV antibiotics and that ideally the IVs may be best for her situation but she refuses a PICC line at this time. She wold prefer oral antibiotics if possible  Objective: Vital signs in last 24 hours: Temp:  [97.5 F (36.4 C)-98.3 F (36.8 C)] 98.3 F (36.8 C) (09/21 0212) Pulse Rate:  [59-73] 73 (09/21 0212) Resp:  [16-25] 16 (09/21 0212) BP: (130-159)/(65-91) 137/81 (09/21 0212) SpO2:  [94 %-100 %] 94 % (09/21 0212)  Intake/Output from previous day:  Intake/Output Summary (Last 24 hours) at 08/28/2020 0635 Last data filed at 08/27/2020 2228 Gross per 24 hour  Intake 2414.17 ml  Output 1580 ml  Net 834.17 ml    Intake/Output this shift: Total I/O In: 401.7 [I.V.:401.7] Out: 360 [Urine:300; Drains:60]  Labs: Recent Labs    08/28/20 0349  HGB 9.3*   Recent Labs    08/28/20 0349  WBC 10.8*  RBC 3.63*  HCT 31.5*  PLT 262   Recent Labs    08/28/20 0349  NA 137  K 4.0  CL 105  CO2 22  BUN 21  CREATININE 0.84  GLUCOSE 191*  CALCIUM 8.7*   No results for input(s): LABPT, INR in the last 72 hours.  EXAM General - Patient is Alert, Appropriate and Oriented Extremity - Neurologically intact Neurovascular intact Compartment soft Dressing - dressing C/D/I Motor Function - intact, moving foot and toes well on exam.  Hemovac pulled without difficulty.  Past Medical History:  Diagnosis Date  . Anemia   . Arthritis    Knee both knees  . Blood transfusion without reported diagnosis 2012   anemia;pt denies transfusion stated was only on iron tablet   . Cataract    left  . CKD (chronic kidney disease), stage III   . CVA (cerebral infarction)    2006  . Diabetes mellitus without complication ([place])   . Family history of anesthesia complication    sister very slow to awaken after anesthesia;severe vomiting   . Gout    left elbow  . Groin pain 06/21/2019  . Hamstring tendinitis 08/22/2019  . Heart murmur   . 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 08-09-14 Right eye  . History of gout 01/04/2019  . Hyperlipidemia   . Hypertension   . Nocturia    3-4 times per night  . 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 Osteomyelitis of right tibia ([place]) 07/23/2020  . Other acute osteomyelitis, right femur ([place]) 07/23/2020  . 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 Stroke ([place]) 2006   x 1 no deficits noted     Assessment/Plan: 1 Day Post-Op Procedure(s) (LRB): IRRIGATION AND DEBRIDEMENT; SPACER EXCHANGE RIGHT KNEE with multiple specimens (Right) Active Problems:   Septic arthritis of knee, right (HCC)   Advance diet Up with therapy  She has decided against PICC line so we will need ID recs on any possible oral alternatives Possible discharge today if she does well with PT  DVT Prophylaxis - Xarelto Weight-Bearing as tolerated to right leg   Beth [name] 08/28/2020,  6:35 AM

## 2020-08-28 NOTE — Progress Notes (Signed)
INFECTIOUS DISEASE PROGRESS NOTE  ID: Beth [name] is a 74 y.o. female with  Active Problems:   Septic arthritis of knee, right (HCC)  Subjective: She is without complaints.   Abtx:  Anti-infectives (From admission, onward)   Start     Dose/Rate Route Frequency Ordered Stop   08/28/20 1100  vancomycin (VANCOREADY) IVPB 1250 mg/250 mL        1,250 mg 166.7 mL/hr over 90 Minutes Intravenous Every 24 hours 08/27/20 1051     08/27/20 1200  ceFEPIme (MAXIPIME) 2 g in sodium chloride 0.9 % 100 mL IVPB        2 g 200 mL/hr over 30 Minutes Intravenous Every 12 hours 08/27/20 0914     08/27/20 1100  vancomycin (VANCOREADY) IVPB 2000 mg/400 mL        2,000 mg 200 mL/hr over 120 Minutes Intravenous  Once 08/27/20 1051 08/27/20 1417   08/27/20 0600  ceFAZolin (ANCEF) IVPB 2g/100 mL premix        2 g 200 mL/hr over 30 Minutes Intravenous On call to O.R. 08/27/20 0531 08/27/20 0804      Medications:  Scheduled: . amLODipine  10 mg Oral Daily  . atorvastatin  20 mg Oral QPM  . Chlorhexidine Gluconate Cloth  6 each Topical Daily  . diltiazem  240 mg Oral Daily  . docusate sodium  100 mg Oral BID  . hydrALAZINE  100 mg Oral Daily  . insulin aspart  0-15 Units Subcutaneous TID WC  . irbesartan  300 mg Oral Daily  . isosorbide dinitrate  10 mg Oral BID  . linagliptin  5 mg Oral Daily  . loratadine  10 mg Oral Daily  . nebivolol  20 mg Oral Daily  . rivaroxaban  10 mg Oral Q breakfast    Objective: Vital signs in last 24 hours: Temp:  [97.7 F (36.5 C)-98.3 F (36.8 C)] 97.8 F (36.6 C) (09/21 1014) Pulse Rate:  [66-82] 81 (09/21 1014) Resp:  [16-18] 17 (09/21 1014) BP: (137-159)/(70-95) 153/76 (09/21 1014) SpO2:  [94 %-100 %] 98 % (09/21 1014) Weight:  [97 kg] 97 kg (09/21 0919)   General appearance: alert, cooperative, no distress and morbidly obese Resp: clear to auscultation bilaterally Cardio: regular rate and rhythm GI: normal findings: bowel sounds normal and soft,  non-tender Extremities: edema BLE edema  Lab Results Recent Labs    08/28/20 0349  WBC 10.8*  HGB 9.3*  HCT 31.5*  NA 137  K 4.0  CL 105  CO2 22  BUN 21  CREATININE 0.84   Liver Panel No results for input(s): PROT, ALBUMIN, AST, ALT, ALKPHOS, BILITOT, BILIDIR, IBILI in the last 72 hours. Sedimentation Rate No results for input(s): ESRSEDRATE in the last 72 hours. C-Reactive Protein No results for input(s): CRP in the last 72 hours.  Microbiology: Recent Results (from the past 240 hour(s))  SARS CORONAVIRUS 2 (TAT 6-24 HRS) Nasopharyngeal Nasopharyngeal Swab     Status: None   Collection Time: 08/23/20 11:00 AM   Specimen: Nasopharyngeal Swab  Result Value Ref Range Status   SARS Coronavirus 2 NEGATIVE NEGATIVE Final    Comment: (NOTE) SARS-CoV-2 target nucleic acids are NOT DETECTED.  The SARS-CoV-2 RNA is generally detectable in upper and lower respiratory specimens during the acute phase of infection. Negative results do not preclude SARS-CoV-2 infection, do not rule out co-infections with other pathogens, and should not be used as the sole basis for treatment or other patient management decisions. Negative  results must be combined with clinical observations, patient history, and epidemiological information. The expected result is Negative.  Fact Sheet for Patients: SugarRoll.be  Fact Sheet for Healthcare Providers: https://www.woods-mathews.com/  This test is not yet approved or cleared by the [place] FDA and  has been authorized for detection and/or diagnosis of SARS-CoV-2 by FDA under an Emergency Use Authorization (EUA). This EUA will remain  in effect (meaning this test can be used) for the duration of the COVID-19 declaration under Se ction 564(b)(1) of the Act, 21 U.S.C. section 360bbb-3(b)(1), unless the authorization is terminated or revoked sooner.  Performed at [facility], [place] [address],  [place], [place] [address]   Body fluid culture     Status: None (Preliminary result)   Collection Time: 08/27/20  7:47 AM   Specimen: KNEE  Result Value Ref Range Status   Specimen Description   Final    KNEE RIGHT SYNOVIAL FLUID Performed at [place] [address] [place], [place] [address]    Special Requests   Final    NONE Performed at [facility], [place] [address], [place], [place] [address]    Gram Stain   Final    NO WBC SEEN NO ORGANISMS SEEN Gram Stain Report Called to,Read Back By and Verified With: GIGLIOTTI,K. RN @0903  ON 9.20.2021 BY [facility] Performed at [facility], [place] [address], [place], [place] [address]    Culture   Final    NO GROWTH 1 DAY Performed at [facility], [place] [address] [place], [place] [address]    Report Status PENDING  Incomplete  Anaerobic culture     Status: None (Preliminary result)   Collection Time: 08/27/20  7:47 AM   Specimen: KNEE  Result Value Ref Range Status   Specimen Description   Final    KNEE RIGHT SYNOVIAL FLUID Performed at [place] [address], [place], [place] [address]    Special Requests   Final    NONE Performed at [facility], [place] [address] [place], [place] [address]    Gram Stain   Final    RARE WBC PRESENT, PREDOMINANTLY PMN NO ORGANISMS SEEN Performed at [facility], [place] [address], [place], [place] [address]    Culture PENDING  Incomplete   Report Status PENDING  Incomplete  Body fluid culture     Status: None (Preliminary result)   Collection Time: 08/27/20  7:47 AM   Specimen: KNEE  Result Value Ref Range Status   Specimen Description   Final    KNEE RIGHT SYNOVIAL FLUID NUMBER 2 Performed at [place] [address] [place], [place] [address]    Special Requests   Final    NONE Performed at [facility], [place] [address] [place],   [address]    Gram Stain   Final    NO WBC SEEN NO ORGANISMS SEEN Gram Stain Report Called to,Read Back By and Verified With: GIGLIOTTI,K. RN @0903  ON 9.20.2021 BY [facility] Performed at [facility], [place] [address], [place],  [address]    Culture   Final    NO GROWTH 1 DAY Performed at [facility], [place] [address], [place],  [address]    Report Status PENDING  Incomplete  Anaerobic culture     Status: None (Preliminary result)   Collection Time: 08/27/20  7:47 AM   Specimen: KNEE  Result Value Ref Range Status   Specimen Description   Final    KNEE RIGHT SYNOVIAL FLUID NUMBER 2 Performed at [place] [address] [place], [place] [address]    Special Requests   Final    NONE Performed at [facility], [place] [address] [place], [place] [address]    Gram Stain   Final    FEW WBC PRESENT,BOTH PMN AND MONONUCLEAR NO ORGANISMS SEEN Performed at [facility], [place] [address], [place], [place] [address]    Culture PENDING  Incomplete   Report Status PENDING  Incomplete  Aerobic/Anaerobic Culture (surgical/deep wound)     Status: None (Preliminary result)   Collection Time: 08/27/20  8:02 AM   Specimen: PATH Soft tissue  Result Value Ref Range Status   Specimen Description   Final    KNEE RIGHT SYNOVIAL TISSUE Performed at [place] [address], [place], [place] [address]    Special Requests   Final    NONE Performed at [facility], [place] [address], [place], [place] [address]    Gram Stain   Final    RARE WBC PRESENT, PREDOMINANTLY MONONUCLEAR NO ORGANISMS SEEN    Culture   Final    NO GROWTH 1 DAY Performed at [facility], [place] [address] [place], [place] [address]    Report Status PENDING  Incomplete  Aerobic/Anaerobic Culture (surgical/deep wound)     Status: None (Preliminary result)   Collection Time: 08/27/20  8:02 AM   Specimen:  PATH Soft tissue  Result Value Ref Range Status   Specimen Description   Final    KNEE RIGHT TISSUE FEMORAL Performed at [place] [address] [place], [place] [address]    Special Requests   Final    NONE Performed at [facility], [place] [address] [place], [place] [address]    Gram Stain   Final    MODERATE WBC PRESENT,BOTH PMN AND MONONUCLEAR NO ORGANISMS SEEN    Culture   Final    NO GROWTH 1 DAY Performed at [facility], [place] [address], [place], [place] [address]    Report Status PENDING  Incomplete  Aerobic/Anaerobic Culture (surgical/deep wound)     Status: None (Preliminary result)   Collection Time: 08/27/20  8:02 AM   Specimen: PATH Soft tissue  Result Value Ref Range Status   Specimen Description   Final    KNEE RIGHT TISSUE TIBIAL CANAL Performed at [place] [address] [place], [place] [address]    Special Requests   Final    NONE Performed at [facility], [place] [address], [place], [place] [address]    Gram Stain   Final    FEW WBC PRESENT,BOTH PMN AND MONONUCLEAR NO ORGANISMS SEEN    Culture   Final    NO GROWTH 1 DAY Performed at [facility], [place] [address], [place], [place] [address]    Report Status PENDING  Incomplete    Studies/Results: No results found.   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 Pt believes that her previous R hip pain was from her spacer, not from her flouroquinolone. Her MRi suggested tendon inflammation with her previous cipro course.   She does not want PIC line so her  options are very limited.  I have sent her specimen to the [facility] for PCR testing (bacterial, fungal primers)        Addendum D/I pharm, her prev ID- would send her home with levaquin 500mg  daily.  Please  have her f/u in ID clinic in 2-3 weeks.     Bobby Rumpf MD, FACP Infectious Diseases (pager) [phone] [email or web address] 08/28/2020, 1:12 PM  LOS: 0 days

## 2020-08-29 LAB — BASIC METABOLIC PANEL
Anion gap: 8 (ref 5–15)
BUN: 22 mg/dL (ref 8–23)
CO2: 23 mmol/L (ref 22–32)
Calcium: 8.6 mg/dL — ABNORMAL LOW (ref 8.9–10.3)
Chloride: 108 mmol/L (ref 98–111)
Creatinine, Ser: 0.94 mg/dL (ref 0.44–1.00)
GFR calc Af Amer: >60 mL/min (ref >60)
GFR calc non Af Amer: 59 mL/min — ABNORMAL LOW (ref >60)
Glucose, Bld: 188 mg/dL — ABNORMAL HIGH (ref 70–99)
Potassium: 4.4 mmol/L (ref 3.5–5.1)
Sodium: 139 mmol/L (ref 135–145)

## 2020-08-29 LAB — CBC
HCT: 27.8 % — ABNORMAL LOW (ref 36.0–46.0)
Hemoglobin: 8.4 g/dL — ABNORMAL LOW (ref 12.0–15.0)
MCH: 26.1 pg (ref 26.0–34.0)
MCHC: 30.2 g/dL (ref 30.0–36.0)
MCV: 86.3 fL (ref 80.0–100.0)
Platelets: 245 10*3/uL (ref 150–400)
RBC: 3.22 MIL/uL — ABNORMAL LOW (ref 3.87–5.11)
RDW: 16.2 % — ABNORMAL HIGH (ref 11.5–15.5)
WBC: 8.5 10*3/uL (ref 4.0–10.5)
nRBC: 0 % (ref 0.0–0.2)

## 2020-08-29 LAB — GLUCOSE, CAPILLARY
Glucose-Capillary: 122 mg/dL — ABNORMAL HIGH (ref 70–99)
Glucose-Capillary: 141 mg/dL — ABNORMAL HIGH (ref 70–99)

## 2020-08-29 MED ORDER — LEVOFLOXACIN 500 MG PO TABS: 500.0000 mg | ORAL_TABLET | Freq: Every day | ORAL | 0 refills | Status: DC

## 2020-08-29 NOTE — Care Management Important Message (Signed)
Important Message  Patient Details IM Letter given to the Patient Name: Beth [name] MRN: [id number] Date of Birth: [birth year]   Medicare Important Message Given:  Yes     Kerin Salen 08/29/2020, 2:07 PM

## 2020-08-29 NOTE — TOC Initial Note (Addendum)
Transition of Care [facility]) - Initial/Assessment Note    Patient Details  Name: Beth [name] MRN: [id number] Date of Birth: Aug 30, 1946  [facility] CM/SW Contact:    Lia Hopping, [place] Phone Number: 08/29/2020, 9:56 AM  Clinical Narrative:                 CSW met with the patient at bedside to discuss home health needs. Patient chose [facility] for [place] checked for staff availability, unfortunately the agency cannot provide PT services for another 7 days.  Patient agreeable to [facility] services. Information written on AVS.   DME-Wide RW and 3 in1 delivered to patient bedside.   Expected Discharge Plan: [place] Barriers to Discharge: Continued Medical Work up   Patient Goals and CMS Choice Patient states their goals for this hospitalization and ongoing recovery are:: return home CMS Medicare.gov Compare Post Acute Care list provided to:: Patient Choice offered to / list presented to : Patient  Expected Discharge Plan and Services Expected Discharge Plan: [place] In-house Referral: Clinical Social Work   Post Acute Care Choice: [place] arrangements for the past 2 months: [place] Expected Discharge Date: 08/29/20               DME Arranged: Berta Minor [place] DME Agency: [employer] Date DME Agency Contacted: 08/28/20 Time DME Agency Contacted: [phone] Representative spoke with at DME Agency: t [place] Arranged: PT [place]: [name] Date [place]: 08/29/20 Time [place]: [phone] Representative spoke with at [place]: [place] Arrangements/Services Living arrangements for the past 2 months: [place] with:: Self Patient language and need for interpreter reviewed:: No Do you feel safe going back to the place where you live?: Yes      Need for Family Participation in Patient Care: Yes (Comment) Care  giver support system in place?: Yes (comment) Current home services: DME Criminal Activity/Legal Involvement Pertinent to Current Situation/Hospitalization: No - Comment as needed  Activities of Daily Living Home Assistive Devices/Equipment: Dentures (specify type), Eyeglasses, Bedside commode/3-in-1, Wheelchair, Environmental consultant (specify type), Cane (specify quad or straight) ADL Screening (condition at time of admission) Patient's cognitive ability adequate to safely complete daily activities?: Yes Is the patient deaf or have difficulty hearing?: No Does the patient have difficulty seeing, even when wearing glasses/contacts?: No Does the patient have difficulty concentrating, remembering, or making decisions?: No Patient able to express need for assistance with ADLs?: Yes Does the patient have difficulty dressing or bathing?: No Independently performs ADLs?: Yes (appropriate for developmental age) Does the patient have difficulty walking or climbing stairs?: Yes Weakness of Legs: Both Weakness of Arms/Hands: None  Permission Sought/Granted   Permission granted to share information with : Yes, Verbal Permission Granted  Share Information with NAME: Kateri Mc  Permission granted to share info w AGENCY: [place] granted to share info w Relationship: Daughter  Permission granted to share info w Contact Information: [phone]  [phone]  Emotional Assessment Appearance:: Appears stated age Attitude/Demeanor/Rapport: Engaged Affect (typically observed): Accepting Orientation: : Oriented to Self, Oriented to Place, Oriented to  Time, Oriented to Situation Alcohol / Substance Use: Not Applicable Psych Involvement: No (comment)  Admission diagnosis:  Septic arthritis of knee, right (HCC) [M00.9] Septic joint of right knee joint ([place]) [M00.9] Patient Active Problem List   Diagnosis Date Noted  . Septic joint of  right knee joint ([place]) 08/28/2020  . Septic arthritis  of knee, right ([place]) 08/27/2020  . Osteomyelitis of right tibia ([place]) 07/23/2020  . Other acute osteomyelitis, right femur ([place]) 07/23/2020  . Hamstring tendinitis of right thigh 08/22/2019  . 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 Right groin pain 06/21/2019  . History of gout 01/04/2019  . 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 Diabetes type 2, controlled ([place]) 03/07/2016  . Gout   . Essential hypertension   . Enuresis 11/19/2015  . HLD (hyperlipidemia) 01/04/2014  . Anemia, iron deficiency 12/30/2011  . Constipation 04/04/2009  . 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

## 2020-08-29 NOTE — Progress Notes (Signed)
Physical Therapy Treatment Patient Details Name: Beth [name] MRN: [id number] DOB: 1946-04-13 Today's Date: 08/29/2020    History of Present Illness s/p R knee I & D with spacer exchange. PMH: DM, HTN, CVA, multiple knee surgeries    PT Comments    Pt progressing. incr amb distance, able to amb 50' with RW and min to min/guard assist. Overall decr assist, will likely need 24hr assist at home for safety/fall prevention   Follow Up Recommendations  Follow surgeons recommendation for DC plan and follow-up therapies     Equipment Recommendations  Rolling walker with 5" wheels;3in1 (PT)    Recommendations for Other Services       Precautions / Restrictions Precautions Precautions: Knee;Fall Required Braces or Orthoses: Knee Immobilizer - Right Restrictions Weight Bearing Restrictions: No    Mobility  Bed Mobility   Bed Mobility: Sit to Supine     Supine to sit: HOB elevated;Min assist     General bed mobility comments: assist with R LE, incr time   Transfers Overall transfer level: Needs assistance Equipment used: Rolling walker (2 wheeled) Transfers: Sit to/from Stand Sit to Stand: Min assist;Mod assist;From elevated surface         General transfer comment: assist for anterior superior wt translation, incr time. pt prefers to push on RW   Ambulation/Gait Ambulation/Gait assistance: Min assist Gait Distance (Feet): 50 Feet Assistive device: Rolling walker (2 wheeled) Gait Pattern/deviations: Step-to pattern;Decreased stance time - right     General Gait Details: cues for sequence, trunk and hip extension   Stairs             Wheelchair Mobility    Modified Rankin (Stroke Patients Only)       Balance           Standing balance support: Bilateral upper extremity supported Standing balance-[name]: Poor                              Cognition Arousal/Alertness: Awake/alert Behavior During Therapy: WFL for tasks  assessed/performed Overall Cognitive Status: Within Functional Limits for tasks assessed                                        Exercises      General Comments        Pertinent Vitals/Pain Pain Assessment: Faces Faces Pain Scale: Hurts a little bit Pain Location: right knee Pain Descriptors / Indicators: Discomfort;Grimacing;Sore Pain Intervention(s): Limited activity within patient's tolerance;Monitored during session;Premedicated before session;Repositioned;Ice applied    Home Living                      Prior Function            PT Goals (current goals can now be found in the care plan section) Acute Rehab PT Goals Patient Stated Goal: home, be able to PT Goal Formulation: With patient Time For Goal Achievement: 09/03/20 Potential to Achieve Goals: Good Progress towards PT goals: Progressing toward goals    Frequency    7X/week      PT Plan Current plan remains appropriate    Co-evaluation              AM-PAC PT "6 Clicks" Mobility   Outcome Measure  Help needed turning from your back to your side while in a flat bed without  using bedrails?: A Lot Help needed moving from lying on your back to sitting on the side of a flat bed without using bedrails?: A Lot Help needed moving to and from a bed to a chair (including a wheelchair)?: A Lot Help needed standing up from a chair using your arms (e.g., wheelchair or bedside chair)?: A Lot Help needed to walk in hospital room?: Total Help needed climbing 3-5 steps with a railing? : Total 6 Click Score: 10    End of Session Equipment Utilized During Treatment: Gait belt;Right knee immobilizer Activity Tolerance: Patient tolerated treatment well Patient left: in chair;with call bell/phone within reach;with chair alarm set (elevated chair ht with blankets and pillows) Nurse Communication: Mobility status PT Visit Diagnosis: Difficulty in walking, not elsewhere classified (R26.2)      Time: 8682-5749 PT Time Calculation (min) (ACUTE ONLY): 32 min  Charges:  $Gait Training: 23-37 mins                     Baxter Flattery, PT  Acute Rehab Dept ([place]) [phone] Pager [phone]  08/29/2020    [facility] [place] 08/29/2020, 12:57 PM

## 2020-08-29 NOTE — Progress Notes (Signed)
Discharge instructions discussed with patient and family, verbalized agreement and understanding 

## 2020-08-29 NOTE — Progress Notes (Signed)
Subjective: 2 Days Post-Op Procedure(s) (LRB): IRRIGATION AND DEBRIDEMENT; SPACER EXCHANGE RIGHT KNEE with multiple specimens (Right) Patient reports pain as mild.   Patient seen in rounds with Dr. Wynelle Link. Patient is well, and has had no acute complaints or problems other than discomfort in the right knee. Voiding without difficulty.  Denies CP, SHOB, N/V.  Plan is to go Home after hospital stay.  Objective: Vital signs in last 24 hours: Temp:  [97.6 F (36.4 C)-98.7 F (37.1 C)] 98.7 F (37.1 C) (09/22 0520) Pulse Rate:  [77-89] 77 (09/22 0520) Resp:  [16-18] 16 (09/22 0520) BP: (145-169)/(76-90) 169/82 (09/22 0520) SpO2:  [98 %-100 %] 100 % (09/22 0520) Weight:  [97 kg] 97 kg (09/21 0919)  Intake/Output from previous day:  Intake/Output Summary (Last 24 hours) at 08/29/2020 0650 Last data filed at 08/29/2020 0600 Gross per 24 hour  Intake 3247.62 ml  Output 1600 ml  Net 1647.62 ml    Intake/Output this shift: Total I/O In: 308.2 [I.V.:308.2] Out: 700 [Urine:700]  Labs: Recent Labs    08/28/20 0349 08/29/20 0327  HGB 9.3* 8.4*   Recent Labs    08/28/20 0349 08/29/20 0327  WBC 10.8* 8.5  RBC 3.63* 3.22*  HCT 31.5* 27.8*  PLT 262 245   Recent Labs    08/28/20 0349 08/29/20 0327  NA 137 139  K 4.0 4.4  CL 105 108  CO2 22 23  BUN 21 22  CREATININE 0.84 0.94  GLUCOSE 191* 188*  CALCIUM 8.7* 8.6*   No results for input(s): LABPT, INR in the last 72 hours.  Exam: General - Patient is Alert and Oriented Extremity - Neurologically intact Sensation intact distally Intact pulses distally Dorsiflexion/Plantar flexion intact Dressing/Incision - clean, dry, no drainage. Bulky dressing removed, aquacel placed.  Motor Function - intact, moving foot and toes well on exam.   Past Medical History:  Diagnosis Date   Anemia    Arthritis    Knee both knees   Blood transfusion without reported diagnosis 2012   anemia;pt denies transfusion stated was  only on iron tablet   Cataract    left   CKD (chronic kidney disease), stage III    CVA (cerebral infarction)    2006   Diabetes mellitus without complication ([place])    Family history of anesthesia complication    sister very slow to awaken after anesthesia;severe vomiting    Gout    left elbow   Groin pain 06/21/2019   Hamstring tendinitis 08/22/2019   Heart murmur    Herpes infection 08-09-14   Saw doctor Wed. 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   Weight as of this encounter: 97 kg. Advance diet Up with therapy D/C IV fluids  DVT Prophylaxis - Xarelto  Weight-bearing as tolerated  Plan for discharge home today following 1 session of PT as long as she continues to meet her goals. ID recommends oral Levaquin 500 mg daily, which we will send for her. Follow up in the office in 2 weeks.   Griffith Citron, PA-C Orthopedic Surgery [phone] 08/29/2020, 6:50 AM

## 2020-08-30 LAB — BODY FLUID CULTURE
Culture: NO GROWTH
Culture: NO GROWTH
Gram Stain: NONE SEEN
Gram Stain: NONE SEEN

## 2020-09-01 DIAGNOSIS — M1030 Gout due to renal impairment, unspecified site: Secondary | ICD-10-CM | POA: Diagnosis not present

## 2020-09-01 DIAGNOSIS — T8453XA Infection and inflammatory reaction due to internal right knee prosthesis, initial encounter: Secondary | ICD-10-CM | POA: Diagnosis not present

## 2020-09-01 DIAGNOSIS — D631 Anemia in chronic kidney disease: Secondary | ICD-10-CM | POA: Diagnosis not present

## 2020-09-01 DIAGNOSIS — N183 Chronic kidney disease, stage 3 unspecified: Secondary | ICD-10-CM | POA: Diagnosis not present

## 2020-09-01 DIAGNOSIS — M1712 Unilateral primary osteoarthritis, left knee: Secondary | ICD-10-CM | POA: Diagnosis not present

## 2020-09-01 DIAGNOSIS — I10 Essential (primary) hypertension: Secondary | ICD-10-CM | POA: Diagnosis not present

## 2020-09-01 DIAGNOSIS — E1169 Type 2 diabetes mellitus with other specified complication: Secondary | ICD-10-CM | POA: Diagnosis not present

## 2020-09-01 DIAGNOSIS — E1122 Type 2 diabetes mellitus with diabetic chronic kidney disease: Secondary | ICD-10-CM | POA: Diagnosis not present

## 2020-09-01 DIAGNOSIS — M86151 Other acute osteomyelitis, right femur: Secondary | ICD-10-CM | POA: Diagnosis not present

## 2020-09-01 DIAGNOSIS — I129 Hypertensive chronic kidney disease with stage 1 through stage 4 chronic kidney disease, or unspecified chronic kidney disease: Secondary | ICD-10-CM | POA: Diagnosis not present

## 2020-09-01 LAB — AEROBIC/ANAEROBIC CULTURE W GRAM STAIN (SURGICAL/DEEP WOUND)
Culture: NO GROWTH
Culture: NO GROWTH
Culture: NO GROWTH

## 2020-09-01 LAB — ANAEROBIC CULTURE

## 2020-09-03 ENCOUNTER — Ambulatory Visit: Payer: Medicare HMO | Admitting: *Deleted

## 2020-09-03 ENCOUNTER — Other Ambulatory Visit: Payer: Self-pay

## 2020-09-03 DIAGNOSIS — I129 Hypertensive chronic kidney disease with stage 1 through stage 4 chronic kidney disease, or unspecified chronic kidney disease: Secondary | ICD-10-CM | POA: Diagnosis not present

## 2020-09-03 DIAGNOSIS — M1712 Unilateral primary osteoarthritis, left knee: Secondary | ICD-10-CM | POA: Diagnosis not present

## 2020-09-03 DIAGNOSIS — E119 Type 2 diabetes mellitus without complications: Secondary | ICD-10-CM

## 2020-09-03 DIAGNOSIS — D631 Anemia in chronic kidney disease: Secondary | ICD-10-CM | POA: Diagnosis not present

## 2020-09-03 DIAGNOSIS — E1122 Type 2 diabetes mellitus with diabetic chronic kidney disease: Secondary | ICD-10-CM | POA: Diagnosis not present

## 2020-09-03 DIAGNOSIS — T8453XA Infection and inflammatory reaction due to internal right knee prosthesis, initial encounter: Secondary | ICD-10-CM | POA: Diagnosis not present

## 2020-09-03 DIAGNOSIS — M86151 Other acute osteomyelitis, right femur: Secondary | ICD-10-CM | POA: Diagnosis not present

## 2020-09-03 DIAGNOSIS — N183 Chronic kidney disease, stage 3 unspecified: Secondary | ICD-10-CM | POA: Diagnosis not present

## 2020-09-03 DIAGNOSIS — I10 Essential (primary) hypertension: Secondary | ICD-10-CM

## 2020-09-03 DIAGNOSIS — M1030 Gout due to renal impairment, unspecified site: Secondary | ICD-10-CM | POA: Diagnosis not present

## 2020-09-03 DIAGNOSIS — E1169 Type 2 diabetes mellitus with other specified complication: Secondary | ICD-10-CM | POA: Diagnosis not present

## 2020-09-03 NOTE — Chronic Care Management (AMB) (Signed)
   Care Management   Note  09/03/2020 Name: Beth [name] MRN: [id number] DOB: 1946/07/01  Patient referred to embedded CCM team to follow-up on a Red EMMI flag from recent automated telephone call s/p hospital discharge. She had a planned knee surgery through [facility] and does not necessarily need TOC by PCP office, as she is being followed by ortho. Patient has been outreached by [place] [facility] [place] RN regarding [employer] EMMI Flag and all concerns were addressed. Patient is listed as "interested in" embedded CCM services but I do not see a telephone call to gain consent for services and an initial visit has not been scheduled.     Follow up plan: Forwarding to embedded Care Guides to outreach patient regarding desire to participate in CCM program at [facility]  Chong Sicilian, BSN, RN-BC Embedded Chronic Care Manager [facility] [place] Management Direct Dial: [phone]

## 2020-09-03 NOTE — Patient Outreach (Signed)
[facility]) Care Management  09/03/2020  Beth [name] [birth year] [id number]    EMMI-General Discharge RED ON EMMI ALERT Day #1 Date: 08/31/2020 Red Alert Reason: "Scheduled follow up? No Other questions/problems? Yes"   Outreach attempt # 1 to patient. Spoke with patient who denies any acute issues or concerns at present. She reports she is getting along fairly well.Reviewed and addressed red alerts with patient. She voices that her son is in the home now and getting ready to call MD office this morning to make follow appt. She reports that her children will be transporting her to appt. She denies any other questions or problems. She has all her meds in the home. She reports that she is taking pain meds as ordered with some relief as well as applying ice. She has been eating good and having regular BMs since returning home. She denies any RN CM needs or concerns at this time. Advised patient that they would get one more automated EMMI-GENERAL post discharge calls to assess how they are doing following recent hospitalization and will receive a call from a nurse if any of their responses were abnormal. Patient voiced understanding and was appreciative of f/u call.    Plan: RN CM will close case at this time.   Enzo [place], RN,BSN,CCM [place] Management Telephonic Care Management Coordinator Direct Phone: [phone] Toll Free: [phone] Fax: [phone]

## 2020-09-04 DIAGNOSIS — M1030 Gout due to renal impairment, unspecified site: Secondary | ICD-10-CM | POA: Diagnosis not present

## 2020-09-04 DIAGNOSIS — T8453XA Infection and inflammatory reaction due to internal right knee prosthesis, initial encounter: Secondary | ICD-10-CM | POA: Diagnosis not present

## 2020-09-04 DIAGNOSIS — M86151 Other acute osteomyelitis, right femur: Secondary | ICD-10-CM | POA: Diagnosis not present

## 2020-09-04 DIAGNOSIS — M1712 Unilateral primary osteoarthritis, left knee: Secondary | ICD-10-CM | POA: Diagnosis not present

## 2020-09-04 DIAGNOSIS — N183 Chronic kidney disease, stage 3 unspecified: Secondary | ICD-10-CM | POA: Diagnosis not present

## 2020-09-04 DIAGNOSIS — I129 Hypertensive chronic kidney disease with stage 1 through stage 4 chronic kidney disease, or unspecified chronic kidney disease: Secondary | ICD-10-CM | POA: Diagnosis not present

## 2020-09-04 DIAGNOSIS — E1122 Type 2 diabetes mellitus with diabetic chronic kidney disease: Secondary | ICD-10-CM | POA: Diagnosis not present

## 2020-09-04 DIAGNOSIS — E1169 Type 2 diabetes mellitus with other specified complication: Secondary | ICD-10-CM | POA: Diagnosis not present

## 2020-09-04 DIAGNOSIS — D631 Anemia in chronic kidney disease: Secondary | ICD-10-CM | POA: Diagnosis not present

## 2020-09-04 LAB — MISCELLANEOUS TEST

## 2020-09-04 NOTE — Discharge Summary (Signed)
Physician Discharge Summary   Patient ID: Beth [name] MRN: [id number] DOB/AGE: Dec 14, 1945 74 y.o.  Admit date: 08/27/2020 Discharge date: 08/29/2020  Primary Diagnosis: Septic arthritis, right knee.  Admission Diagnoses:  Past Medical History:  Diagnosis Date  . Anemia   . Arthritis    Knee both knees  . Blood transfusion without reported diagnosis 2012   anemia;pt denies transfusion stated was only on iron tablet  . Cataract    left  . CKD (chronic kidney disease), stage III   . CVA (cerebral infarction)    2006  . Diabetes mellitus without complication ([place])   . Family history of anesthesia complication    sister very slow to awaken after anesthesia;severe vomiting   . Gout    left elbow  . Groin pain 06/21/2019  . Hamstring tendinitis 08/22/2019  . Heart murmur   . 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 08-09-14 Right eye  . History of gout 01/04/2019  . Hyperlipidemia   . Hypertension   . Nocturia    3-4 times per night  . 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 Osteomyelitis of right tibia ([place]) 07/23/2020  . Other acute osteomyelitis, right femur ([place]) 07/23/2020  . 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 Stroke [facility]) 2006   x 1 no deficits noted    Discharge Diagnoses:   Active Problems:   Septic arthritis of knee, right (HCC)   Septic joint of right knee joint (HCC)  Estimated body mass index is 34.52 kg/m as calculated from the following:   Height as of this encounter: _0  (1.676 m).   Weight as of this encounter: 97 kg.  Procedure:  Procedure(s) (LRB): IRRIGATION AND DEBRIDEMENT; SPACER EXCHANGE RIGHT KNEE with multiple specimens (Right)   Consults: ID  HPI: The patient is a 74 year old female with long complex history in regards to her right knee.  She has an antibiotic spacer in place, but her sed rate is not normalizing back to regular normal levels.  In addition, the spacer has  dislodged from the tibia.  She has had  multiple discussions about next steps including exchanging the spacer with irrigation, debridement versus fusion versus amputation.  She has elected to exchange the spacer with an irrigation and debridement and multiple cultures.  She presents today for that operation.  Laboratory Data: Admission on 08/27/2020, Discharged on 08/29/2020  Component Date Value Ref Range Status  . Glucose-Capillary 08/27/2020 134* 70 - 99 mg/dL Final   Glucose reference range applies only to samples taken after fasting for at least 8 hours.  . Comment 1 08/27/2020 Notify RN   Final  . Comment 2 08/27/2020 Document in Chart   Final  . Specimen Description 08/27/2020    Final                   Value:KNEE RIGHT SYNOVIAL TISSUE Performed at [facility], [place] [address] [place], [place] [address]   . Special Requests 08/27/2020    Final                   Value:NONE Performed at [facility], [place] [address], [place], [place] [address]   . Gram Stain 08/27/2020    Final                   Value:RARE WBC PRESENT, PREDOMINANTLY MONONUCLEAR NO ORGANISMS SEEN   . Culture 08/27/2020    Final  Value:No growth aerobically or anaerobically. Performed at [facility], [place] [address], [place], [place] [address]   . Report Status 08/27/2020 09/01/2020 FINAL   Final  . Specimen Description 08/27/2020    Final                   Value:KNEE RIGHT TISSUE FEMORAL Performed at [facility], [place] [address], [place], [place] [address]   . Special Requests 08/27/2020    Final                   Value:NONE Performed at [facility], [place] [address], [place], [place] [address]   . Gram Stain 08/27/2020    Final                   Value:MODERATE WBC PRESENT,BOTH PMN AND MONONUCLEAR NO ORGANISMS SEEN   . Culture 08/27/2020    Final                   Value:No growth aerobically or anaerobically. Performed at [facility],  [place] [address], [place], [place] [address]   . Report Status 08/27/2020 09/01/2020 FINAL   Final  . Specimen Description 08/27/2020    Final                   Value:KNEE RIGHT TISSUE TIBIAL CANAL Performed at [facility], [place] [address], [place], [place] [address]   . Special Requests 08/27/2020    Final                   Value:NONE Performed at [facility], [place] [address], [place], [place] [address]   . Gram Stain 08/27/2020    Final                   Value:FEW WBC PRESENT,BOTH PMN AND MONONUCLEAR NO ORGANISMS SEEN   . Culture 08/27/2020    Final                   Value:No growth aerobically or anaerobically. Performed at [facility], [place] [address] [place], [place] [address]   . Report Status 08/27/2020 09/01/2020 FINAL   Final  . Specimen Description 08/27/2020    Final                   Value:KNEE RIGHT SYNOVIAL FLUID Performed at [facility], [place] [address], [place], [place] [address]   . Special Requests 08/27/2020    Final                   Value:NONE Performed at [facility], [place] [address], [place], [place] [address]   . Gram Stain 08/27/2020    Final                   Value:NO WBC SEEN NO ORGANISMS SEEN Gram Stain Report Called to,Read Back By and Verified With: GIGLIOTTI,K. RN _0  ON 9.20.2021 BY NMCCOY Performed at [facility], [place] [address], [place], [place] [address]   . Culture 08/27/2020    Final                   Value:NO GROWTH 3 DAYS Performed at [facility], [place] [address], [place], [place] [address]   . Report Status 08/27/2020 08/30/2020 FINAL   Final  . Specimen Description 08/27/2020  Final                   Value:KNEE RIGHT SYNOVIAL FLUID Performed at [facility], [place] [address], [place], [place] [address]   . Special Requests 08/27/2020    Final                   Value:NONE Performed at [facility], [place] [address], [place], [place] [address]   . Gram Stain 08/27/2020    Final                   Value:RARE WBC PRESENT, PREDOMINANTLY PMN NO ORGANISMS SEEN   . Culture 08/27/2020    Final                   Value:NO ANAEROBES ISOLATED Performed at [facility], [place] [address], [place], [place] [address]   . Report Status 08/27/2020 09/01/2020 FINAL   Final  . Specimen Description 08/27/2020    Final                   Value:KNEE RIGHT SYNOVIAL FLUID NUMBER 2 Performed at [place] [address], [place], [place] [address]   . Special Requests 08/27/2020    Final                   Value:NONE Performed at [facility], [place] [address], [place], [place] [address]   . Gram Stain 08/27/2020    Final                   Value:NO WBC SEEN NO ORGANISMS SEEN Gram Stain Report Called to,Read Back By and Verified With: GIGLIOTTI,K. RN _0  ON 9.20.2021 BY NMCCOY Performed at [facility], [place] [address] [place], [place] [address]   . Culture 08/27/2020    Final                   Value:NO GROWTH 3 DAYS Performed at [facility], [place] [address] [place], [place] [address]   . Report Status 08/27/2020 08/30/2020 FINAL   Final  . Specimen Description 08/27/2020    Final                   Value:KNEE RIGHT SYNOVIAL FLUID NUMBER 2 Performed at [place] [address], [place], [place] [address]   . Special Requests 08/27/2020    Final                   Value:NONE Performed at [facility], [place] [address], [place], [place] [address]   . Gram Stain 08/27/2020    Final                   Value:FEW WBC PRESENT,BOTH PMN AND MONONUCLEAR NO ORGANISMS SEEN   . Culture 08/27/2020    Final                   Value:NO ANAEROBES ISOLATED Performed at [facility], [place] [address], [place], [place] [address]   . Report Status 08/27/2020 09/01/2020 FINAL   Final  .  Glucose-Capillary 08/27/2020 192* 70 - 99 mg/dL Final   Glucose reference range applies only to samples taken after fasting for at least 8 hours.  . Comment 1 08/27/2020 Notify RN   Final  . Comment 2 08/27/2020 Document in  Chart   Final  . Hgb A1c MFr Bld 08/27/2020 5.8* 4.8 - 5.6 % Final   Comment: (NOTE) Pre diabetes:          5.7%-6.4%  Diabetes:              >6.4%  Glycemic control for   <7.0% adults with diabetes   . Mean Plasma Glucose 08/27/2020 119.76  mg/dL Final   Performed at [place] [address] [place], [place] [address]  . Glucose-Capillary 08/27/2020 176* 70 - 99 mg/dL Final   Glucose reference range applies only to samples taken after fasting for at least 8 hours.  . WBC 08/28/2020 10.8* 4.0 - 10.5 K/uL Final  . RBC 08/28/2020 3.63* 3.87 - 5.11 MIL/uL Final  . Hemoglobin 08/28/2020 9.3* 12.0 - 15.0 g/dL Final  . HCT 08/28/2020 31.5* 36 - 46 % Final  . MCV 08/28/2020 86.8  80.0 - 100.0 fL Final  . MCH 08/28/2020 25.6* 26.0 - 34.0 pg Final  . MCHC 08/28/2020 29.5* 30.0 - 36.0 g/dL Final  . RDW 08/28/2020 15.9* 11.5 - 15.5 % Final  . Platelets 08/28/2020 262  150 - 400 K/uL Final  . nRBC 08/28/2020 0.0  0.0 - 0.2 % Final   Performed at [facility], [place] [address] [place], [place] [address]  . Sodium 08/28/2020 137  135 - 145 mmol/L Final  . Potassium 08/28/2020 4.0  3.5 - 5.1 mmol/L Final  . Chloride 08/28/2020 105  98 - 111 mmol/L Final  . CO2 08/28/2020 22  22 - 32 mmol/L Final  . Glucose, Bld 08/28/2020 191* 70 - 99 mg/dL Final   Glucose reference range applies only to samples taken after fasting for at least 8 hours.  . BUN 08/28/2020 21  8 - 23 mg/dL Final  . Creatinine, Ser 08/28/2020 0.84  0.44 - 1.00 mg/dL Final  . Calcium 08/28/2020 8.7* 8.9 - 10.3 mg/dL Final  . GFR calc non Af Amer 08/28/2020 >60  >60 mL/min Final  . GFR calc Af Amer 08/28/2020 >60  >60 mL/min Final  . Anion gap 08/28/2020 10  5 - 15 Final    Performed at [facility], [place] [address] [place], [place] [address]  . Glucose-Capillary 08/27/2020 214* 70 - 99 mg/dL Final   Glucose reference range applies only to samples taken after fasting for at least 8 hours.  . Glucose-Capillary 08/27/2020 214* 70 - 99 mg/dL Final   Glucose reference range applies only to samples taken after fasting for at least 8 hours.  . Glucose-Capillary 08/28/2020 132* 70 - 99 mg/dL Final   Glucose reference range applies only to samples taken after fasting for at least 8 hours.  . Glucose-Capillary 08/28/2020 176* 70 - 99 mg/dL Final   Glucose reference range applies only to samples taken after fasting for at least 8 hours.  . WBC 08/29/2020 8.5  4.0 - 10.5 K/uL Final  . RBC 08/29/2020 3.22* 3.87 - 5.11 MIL/uL Final  . Hemoglobin 08/29/2020 8.4* 12.0 - 15.0 g/dL Final  . HCT 08/29/2020 27.8* 36 - 46 % Final  . MCV 08/29/2020 86.3  80.0 - 100.0 fL Final  . MCH 08/29/2020 26.1  26.0 - 34.0 pg Final  . MCHC 08/29/2020 30.2  30.0 - 36.0 g/dL Final  . RDW 08/29/2020 16.2* 11.5 - 15.5 % Final  . Platelets 08/29/2020 245  150 - 400 K/uL Final  . nRBC 08/29/2020 0.0  0.0 - 0.2 %  Final   Performed at [facility], [place] [address], [place], [place] [address]  . Sodium 08/29/2020 139  135 - 145 mmol/L Final  . Potassium 08/29/2020 4.4  3.5 - 5.1 mmol/L Final  . Chloride 08/29/2020 108  98 - 111 mmol/L Final  . CO2 08/29/2020 23  22 - 32 mmol/L Final  . Glucose, Bld 08/29/2020 188* 70 - 99 mg/dL Final   Glucose reference range applies only to samples taken after fasting for at least 8 hours.  . BUN 08/29/2020 22  8 - 23 mg/dL Final  . Creatinine, Ser 08/29/2020 0.94  0.44 - 1.00 mg/dL Final  . Calcium 08/29/2020 8.6* 8.9 - 10.3 mg/dL Final  . GFR calc non Af Amer 08/29/2020 59* >60 mL/min Final  . GFR calc Af Amer 08/29/2020 >60  >60 mL/min Final  . Anion gap 08/29/2020 8  5 - 15 Final   Performed at [facility], [place] [address], [place], [place] [address]  . Glucose-Capillary 08/28/2020 210* 70 - 99 mg/dL Final   Glucose reference range applies only to samples taken after fasting for at least 8 hours.  . Glucose-Capillary 08/28/2020 279* 70 - 99 mg/dL Final   Glucose reference range applies only to samples taken after fasting for at least 8 hours.  . Glucose-Capillary 08/29/2020 122* 70 - 99 mg/dL Final   Glucose reference range applies only to samples taken after fasting for at least 8 hours.  . Glucose-Capillary 08/29/2020 141* 70 - 99 mg/dL Final   Glucose reference range applies only to samples taken after fasting for at least 8 hours.  Hospital Outpatient Visit on 08/23/2020  Component Date Value Ref Range Status  . SARS Coronavirus 2 08/23/2020 NEGATIVE  NEGATIVE Final   Comment: (NOTE) SARS-CoV-2 target nucleic acids are NOT DETECTED.  The SARS-CoV-2 RNA is generally detectable in upper and lower respiratory specimens during the acute phase of infection. Negative results do not preclude SARS-CoV-2 infection, do not rule out co-infections with other pathogens, and should not be used as the sole basis for treatment or other patient management decisions. Negative results must be combined with clinical observations, patient history, and epidemiological information. The expected result is Negative.  Fact Sheet for Patients: SugarRoll.be  Fact Sheet for Healthcare Providers: https://www.woods-mathews.com/  This test is not yet approved or cleared by the [place] FDA and  has been authorized for detection and/or diagnosis of SARS-CoV-2 by FDA under an Emergency Use Authorization (EUA). This EUA will remain  in effect (meaning this test can be used) for the duration of the COVID-19 declaration under Se                          ction 564(b)(1) of the Act, 21 U.S.C. section 360bbb-3(b)(1), unless the authorization is terminated  or revoked sooner.  Performed at [facility], [place] [address] [place], [place] [address]   Hospital Outpatient Visit on 08/21/2020  Component Date Value Ref Range Status  . aPTT 08/21/2020 36  24 - 36 seconds Final   Performed at [facility], [place] [address], [place], [place] [address]  . WBC 08/21/2020 8.0  4.0 - 10.5 K/uL Final  . RBC 08/21/2020 4.39  3.87 - 5.11 MIL/uL Final  . Hemoglobin 08/21/2020 11.5* 12.0 - 15.0 g/dL Final  . HCT 08/21/2020 36.9  36 - 46 % Final  . MCV 08/21/2020 84.1  80.0 - 100.0 fL  Final  . MCH 08/21/2020 26.2  26.0 - 34.0 pg Final  . MCHC 08/21/2020 31.2  30.0 - 36.0 g/dL Final  . RDW 08/21/2020 15.9* 11.5 - 15.5 % Final  . Platelets 08/21/2020 331  150 - 400 K/uL Final  . nRBC 08/21/2020 0.0  0.0 - 0.2 % Final   Performed at [facility], [place] [address], [place], [place] [address]  . Sodium 08/21/2020 142  135 - 145 mmol/L Final  . Potassium 08/21/2020 4.1  3.5 - 5.1 mmol/L Final  . Chloride 08/21/2020 104  98 - 111 mmol/L Final  . CO2 08/21/2020 24  22 - 32 mmol/L Final  . Glucose, Bld 08/21/2020 89  70 - 99 mg/dL Final   Glucose reference range applies only to samples taken after fasting for at least 8 hours.  . BUN 08/21/2020 31* 8 - 23 mg/dL Final  . Creatinine, Ser 08/21/2020 0.92  0.44 - 1.00 mg/dL Final  . Calcium 08/21/2020 9.3  8.9 - 10.3 mg/dL Final  . Total Protein 08/21/2020 7.6  6.5 - 8.1 g/dL Final  . Albumin 08/21/2020 3.8  3.5 - 5.0 g/dL Final  . AST 08/21/2020 16  15 - 41 U/L Final  . ALT 08/21/2020 11  0 - 44 U/L Final  . Alkaline Phosphatase 08/21/2020 98  38 - 126 U/L Final  . Total Bilirubin 08/21/2020 0.7  0.3 - 1.2 mg/dL Final  . GFR calc non Af Amer 08/21/2020 >60  >60 mL/min Final  . GFR calc Af Amer 08/21/2020 >60  >60 mL/min Final  . Anion gap 08/21/2020 14  5 - 15 Final   Performed at [facility], [place] [address] [place], [place] [address]  . Prothrombin  Time 08/21/2020 12.7  11.4 - 15.2 seconds Final  . INR 08/21/2020 1.0  0.8 - 1.2 Final   Comment: (NOTE) INR goal varies based on device and disease states. Performed at [facility], [place] [address], [place], [place] [address]   . ABO/RH(D) 08/21/2020 O POS   Final  . Antibody Screen 08/21/2020 NEG   Final  . Sample Expiration 08/21/2020 08/30/2020,2359   Final  . Extend sample reason 08/21/2020    Final                   Value:NO TRANSFUSIONS OR PREGNANCY IN THE PAST 3 MONTHS Performed at [facility], [place] [address] [place], [place] [address]   . Glucose-Capillary 08/21/2020 96  70 - 99 mg/dL Final   Glucose reference range applies only to samples taken after fasting for at least 8 hours.  Office Visit on 08/16/2020  Component Date Value Ref Range Status  . WBC 08/16/2020 7.8  3.4 - 10.8 x10E3/uL Final  . RBC 08/16/2020 4.68  3.77 - 5.28 x10E6/uL Final  . Hemoglobin 08/16/2020 12.1  11.1 - 15.9 g/dL Final  . Hematocrit 08/16/2020 38.0  34.0 - 46.6 % Final  . MCV 08/16/2020 81  79 - 97 fL Final  . MCH 08/16/2020 25.9* 26.6 - 33.0 pg Final  . MCHC 08/16/2020 31.8  31 - 35 g/dL Final  . RDW 08/16/2020 14.9  11.7 - 15.4 % Final  . Platelets 08/16/2020 334  150 - 450 x10E3/uL Final  . Neutrophils 08/16/2020 63  Not Estab. % Final  . Lymphs 08/16/2020 19  Not Estab. % Final  . Monocytes 08/16/2020 10  Not Estab. % Final  . Eos 08/16/2020 7  Not Estab. %  Final  . Basos 08/16/2020 1  Not Estab. % Final  . Neutrophils Absolute 08/16/2020 5.0  1 - 7 x10E3/uL Final  . Lymphocytes Absolute 08/16/2020 1.4  0 - 3 x10E3/uL Final  . Monocytes Absolute 08/16/2020 0.8  0 - 0 x10E3/uL Final  . EOS (ABSOLUTE) 08/16/2020 0.5* 0.0 - 0.4 x10E3/uL Final  . Basophils Absolute 08/16/2020 0.1  0 - 0 x10E3/uL Final  . Immature Granulocytes 08/16/2020 0  Not Estab. % Final  . Immature Grans (Abs) 08/16/2020 0.0  0.0 - 0.1 x10E3/uL Final  . Glucose 08/16/2020 83  65  - 99 mg/dL Final  . BUN 08/16/2020 27  8 - 27 mg/dL Final  . Creatinine, Ser 08/16/2020 1.26* 0.57 - 1.00 mg/dL Final  . GFR calc non Af Amer 08/16/2020 41* >59 mL/min/1.73 Final  . GFR calc Af Amer 08/16/2020 48* >59 mL/min/1.73 Final   Comment: **[facility] currently reports eGFR in compliance with the current**   recommendations of the [employer]. [facility] will   update reporting as new guidelines are published from the NKF-ASN   Task force.   . BUN/Creatinine Ratio 08/16/2020 21  12 - 28 Final  . Sodium 08/16/2020 140  134 - 144 mmol/L Final  . Potassium 08/16/2020 4.5  3.5 - 5.2 mmol/L Final  . Chloride 08/16/2020 102  96 - 106 mmol/L Final  . CO2 08/16/2020 24  20 - 29 mmol/L Final  . Calcium 08/16/2020 9.5  8.7 - 10.3 mg/dL Final  . Total Protein 08/16/2020 7.6  6.0 - 8.5 g/dL Final  . Albumin 08/16/2020 4.4  3.7 - 4.7 g/dL Final  . Globulin, Total 08/16/2020 3.2  1.5 - 4.5 g/dL Final  . Albumin/Globulin Ratio 08/16/2020 1.4  1.2 - 2.2 Final  . Bilirubin Total 08/16/2020 0.5  0.0 - 1.2 mg/dL Final  . Alkaline Phosphatase 08/16/2020 141* 48 - 121 IU/L Final   Comment: **Effective August 20, 2020 Alkaline Phosphatase**   reference interval will be changing to:              Age                Female          Female           0 -  5 days         74 - 127       29 - 127           6 - 10 days         46 - 242       28 - 23          11 - 20 days        109 - 357      109 - 357          21 - 30 days         94 - 856       31 - 494           1 -  2 months      149 - 539      149 - 539           3 -  6 months      131 - 452      131 - 452           7 - 11 months      117 - 401  117 - 401   12 months -  6 years       158 - 369      158 - 369           7 - 12 years       150 - 409      150 - 409               13 years       156 - 435       78 - 227               14 years       114 - 375       64 - 161               15 years        33 - 279       62 - 134                16 years        61 - 207       51 - 121               17 years        40 - 161       23 - 113          18 - 20 years        30 - 125       58 - 106              >20 years                                  59 - 121       45 - 121   . AST 08/16/2020 10  0 - 40 IU/L Final  . ALT 08/16/2020 8  0 - 32 IU/L Final  . HB A1C (BAYER DCA - WAIVED) 08/16/2020 5.4  <7.0 % Final   Comment:                                       Diabetic Adult            <7.0                                       Healthy Adult        4.3 - 5.7                                                           (DCCT/NGSP) American Diabetes Association's Summary of Glycemic Recommendations for Adults with Diabetes: Hemoglobin A1c <7.0%. More stringent glycemic goals (A1c <6.0%) may further reduce complications at the cost of increased risk of hypoglycemia.   Office Visit on 07/23/2020  Component Date Value Ref Range Status  . CRP 07/23/2020 3.7  <8.0 mg/L Final  . Sed Rate 07/23/2020 51* 0 - 30 mm/h Final  . Glucose, Bld 07/23/2020 93  65 -  99 mg/dL Final   Comment: .            Fasting reference interval .   . BUN 07/23/2020 25  7 - 25 mg/dL Final  . Creat 07/23/2020 1.19* 0.60 - 0.93 mg/dL Final   Comment: For patients >47 years of age, the reference limit for Creatinine is approximately 13% higher for people identified as African-American. .   . GFR, Est Non African American 07/23/2020 44* > OR = 60 mL/min/1.75m Final  . GFR, Est African American 07/23/2020 51* > OR = 60 mL/min/1.761mFinal  . BUN/Creatinine Ratio 07/23/2020 21  6 - 22 (calc) Final  . Sodium 07/23/2020 137  135 - 146 mmol/L Final  . Potassium 07/23/2020 4.3  3.5 - 5.3 mmol/L Final  . Chloride 07/23/2020 102  98 - 110 mmol/L Final  . CO2 07/23/2020 26  20 - 32 mmol/L Final  . Calcium 07/23/2020 9.8  8.6 - 10.4 mg/dL Final  . WBC 07/23/2020 7.6  3.8 - 10.8 Thousand/uL Final  . RBC 07/23/2020 4.70  3.80 - 5.10 Million/uL Final  . Hemoglobin  07/23/2020 12.0  11.7 - 15.5 g/dL Final  . HCT 07/23/2020 38.4  35 - 45 % Final  . MCV 07/23/2020 81.7  80.0 - 100.0 fL Final  . MCH 07/23/2020 25.5* 27.0 - 33.0 pg Final  . MCHC 07/23/2020 31.3* 32.0 - 36.0 g/dL Final  . RDW 07/23/2020 14.2  11.0 - 15.0 % Final  . Platelets 07/23/2020 344  140 - 400 Thousand/uL Final  . MPV 07/23/2020 10.3  7.5 - 12.5 fL Final  . Neutro Abs 07/23/2020 4,689  1,500 - 7,800 cells/uL Final  . Lymphs Abs 07/23/2020 1,512  850 - 3,900 cells/uL Final  . Absolute Monocytes 07/23/2020 593  200 - 950 cells/uL Final  . Eosinophils Absolute 07/23/2020 752* 15 - 500 cells/uL Final  . Basophils Absolute 07/23/2020 53  0 - 200 cells/uL Final  . Neutrophils Relative % 07/23/2020 61.7  % Final  . Total Lymphocyte 07/23/2020 19.9  % Final  . Monocytes Relative 07/23/2020 7.8  % Final  . Eosinophils Relative 07/23/2020 9.9  % Final  . Basophils Relative 07/23/2020 0.7  % Final     X-Rays:No results found.  EKG: Orders placed or performed during the hospital encounter of 12/11/19  . EKG 12-Lead  . EKG 12-Lead  . EKG     Hospital Course: NoLULLA LINVILLEs a [age].[facility]. [facility] and underwent Procedure(s): [place] EXCHANGE RIGHT KNEE with multiple specimens.  Patient tolerated the procedure well and was later transferred to the recovery room and then to the orthopaedic floor for postoperative care. They were given PO and IV analgesics for pain control following their surgery. They were given 24 hours of postoperative antibiotics of  Anti-infectives (From admission, onward)   Start     Dose/Rate Route Frequency Ordered Stop   08/29/20 0000  levofloxacin (LEVAQUIN) 500 MG tablet  Status:  Discontinued        500 mg Oral Daily 08/29/20 0654 08/29/20    08/28/20 1100  vancomycin (VANCOREADY) IVPB 1250 mg/250 mL  Status:  Discontinued        1,250 mg 166.7 mL/hr  over 90 Minutes Intravenous Every 24 hours 08/27/20 1051 08/29/20 2003   08/28/20 0000  levofloxacin (LEVAQUIN) 500 MG tablet        500 mg Oral  Daily 08/28/20 1342 09/27/20 2359   08/27/20 1200  ceFEPIme (MAXIPIME) 2 g in sodium chloride 0.9 % 100 mL IVPB  Status:  Discontinued        2 g 200 mL/hr over 30 Minutes Intravenous Every 12 hours 08/27/20 0914 08/29/20 2003   08/27/20 1100  vancomycin (VANCOREADY) IVPB 2000 mg/400 mL        2,000 mg 200 mL/hr over 120 Minutes Intravenous  Once 08/27/20 1051 08/27/20 1417   08/27/20 0600  ceFAZolin (ANCEF) IVPB 2g/100 mL premix        2 g 200 mL/hr over 30 Minutes Intravenous On call to O.R. 08/27/20 8786 08/27/20 0804     and started on DVT prophylaxis in the form of Xarelto.   PT and OT were ordered for total joint protocol. Discharge planning consulted to help with postop disposition and equipment needs. Patient had a good night on the evening of surgery. They started to get up OOB with therapy on POD #0. Seen in rounds on POD #1 and told us she did not wish to proceed with PICC line placement for IV antibiotic therapy. Infectious disease consulted, and recommended Levaquin 500 mg daily. Continued to work with therapy into POD #2. Pt was seen during rounds on day two and was ready to go home pending progress with therapy.  Pt worked with therapy for one additional session and was meeting their goals. She was discharged to home later that day in stable condition.  Diet: Regular diet Activity: WBAT Follow-up: in 2 weeks Disposition: Home Discharged Condition: good    Allergies as of 08/29/2020      Reactions   Aleve [naproxen Sodium] Other (See Comments)   Heart races   Asa [aspirin] Other (See Comments)   Nose bleeding   Ciprofloxacin Other (See Comments)   Possible hamstring tendinopathy   Clonidine Derivatives Other (See Comments)   Dizziness and weakness   Shellfish Allergy Nausea And Vomiting      Medication List    STOP taking  these medications   COD LIVER OIL PO   SUPER C COMPLEX PO     TAKE these medications   acetaminophen 500 MG tablet Commonly known as: TYLENOL Take 2 tablets (1,000 mg total) by mouth every 6 (six) hours as needed for mild pain.   amLODipine 10 MG tablet Commonly known as: NORVASC TAKE 1 TABLET DAILY IN THE EVENING What changed: when to take this   atorvastatin 20 MG tablet Commonly known as: LIPITOR TAKE 1 TABLET DAILY IN THE EVENING What changed: when to take this   B-D SINGLE USE SWABS REGULAR Pads Use to check glucose up to four times daily Dx V67.2   Bystolic 20 MG Tabs Generic drug: Nebivolol HCl TAKE 1 TABLET EVERY DAY What changed:   how much to take  how to take this  when to take this  additional instructions   diltiazem 240 MG 24 hr capsule Commonly known as: CARDIZEM CD TAKE 1 CAPSULE EVERY DAY   ferrous sulfate 325 (65 FE) MG tablet Take 325 mg by mouth daily.   fexofenadine 180 MG tablet Commonly known as: ALLEGRA Take 1 tablet (180 mg total) by mouth daily. For allergy symptoms   hydrALAZINE 100 MG tablet Commonly known as: APRESOLINE TAKE ONE (1) TABLET THREE (3) TIMES EACH DAY What changed: See the new instructions.   HYDROcodone-acetaminophen 5-325 MG tablet Commonly known as: NORCO/VICODIN Take 1-2 tablets by mouth every 6 (six) hours as needed for severe  pain. Notes to patient: ** Contains Tylenol ** Do not take more than 4000 mg total daily dose of Tylenol from all meds that contain Tylenol  May cause constipation.   May make you dizzy or drowsy. Do not drive or do anything that could be dangerous until you know how this medicine affects you.    isosorbide dinitrate 10 MG tablet Commonly known as: ISORDIL TAKE 1 TABLET THREE TIMES DAILY What changed: when to take this   levofloxacin 500 MG tablet Commonly known as: LEVAQUIN Take 1 tablet (500 mg total) by mouth daily. Notes to patient: Treats infection Take it with or without  food. Take all of the medicine in your prescription to clear up your infection, even if you feel better after the first few doses.  Drink extra fluids so you will urinate more often and help prevent kidney problems.   linagliptin 5 MG Tabs tablet Commonly known as: Tradjenta Take 1 tablet (5 mg total) by mouth daily.   metFORMIN 500 MG 24 hr tablet Commonly known as: GLUCOPHAGE-XR TAKE 1 TABLET EVERY DAY   methocarbamol 500 MG tablet Commonly known as: ROBAXIN Take 1 tablet (500 mg total) by mouth every 6 (six) hours as needed for muscle spasms. Notes to patient: May make you dizzy or drowsy. Do not drive or do anything that could be dangerous until you know how this medicine affects you.   olmesartan 40 MG tablet Commonly known as: BENICAR TAKE ONE (1) TABLET EACH DAY What changed: See the new instructions.   rivaroxaban 10 MG Tabs tablet Commonly known as: XARELTO Take 1 tablet (10 mg total) by mouth daily with breakfast for 20 days. Then take one 81 mg aspirin once a day for three weeks. Then discontinue aspirin. Notes to patient: To prevent blood clots   traMADol 50 MG tablet Commonly known as: ULTRAM Take 1-2 tablets (50-100 mg total) by mouth every 6 (six) hours as needed for moderate pain.   True Metrix Air Glucose Meter w/Device Kit 1 Device by Does not apply route 4 (four) times daily. Dx E11.9   True Metrix Blood Glucose Test test strip Generic drug: glucose blood Use to check glucose up to four times daily   True Metrix Level 1 Low Soln Use with glucose monitor Dx E11.9   TRUEplus Lancets 33G Misc Test QID Dx E11.9       Follow-up Information    [name], MD. Schedule an appointment as soon as possible for a visit on 09/11/2020.   Specialty: Orthopedic Surgery Contact information: [address] [place] [address] [phone]        Care, [facility] Follow up.   Specialty: Home Health Services Why: This agency will  provide your Physcial Therapy. The agency will call you prior to the first visit.  Contact information: [place] [phone]               Signed: Griffith Citron, PA-C Orthopedic Surgery 09/04/2020, 7:06 AM

## 2020-09-08 ENCOUNTER — Other Ambulatory Visit: Payer: Self-pay | Admitting: Family Medicine

## 2020-09-08 DIAGNOSIS — I129 Hypertensive chronic kidney disease with stage 1 through stage 4 chronic kidney disease, or unspecified chronic kidney disease: Secondary | ICD-10-CM | POA: Diagnosis not present

## 2020-09-08 DIAGNOSIS — N183 Chronic kidney disease, stage 3 unspecified: Secondary | ICD-10-CM | POA: Diagnosis not present

## 2020-09-08 DIAGNOSIS — D631 Anemia in chronic kidney disease: Secondary | ICD-10-CM | POA: Diagnosis not present

## 2020-09-08 DIAGNOSIS — M1030 Gout due to renal impairment, unspecified site: Secondary | ICD-10-CM | POA: Diagnosis not present

## 2020-09-08 DIAGNOSIS — E1169 Type 2 diabetes mellitus with other specified complication: Secondary | ICD-10-CM | POA: Diagnosis not present

## 2020-09-08 DIAGNOSIS — E1122 Type 2 diabetes mellitus with diabetic chronic kidney disease: Secondary | ICD-10-CM | POA: Diagnosis not present

## 2020-09-08 DIAGNOSIS — T8453XA Infection and inflammatory reaction due to internal right knee prosthesis, initial encounter: Secondary | ICD-10-CM | POA: Diagnosis not present

## 2020-09-08 DIAGNOSIS — M86151 Other acute osteomyelitis, right femur: Secondary | ICD-10-CM | POA: Diagnosis not present

## 2020-09-08 DIAGNOSIS — M1712 Unilateral primary osteoarthritis, left knee: Secondary | ICD-10-CM | POA: Diagnosis not present

## 2020-09-10 DIAGNOSIS — E1169 Type 2 diabetes mellitus with other specified complication: Secondary | ICD-10-CM | POA: Diagnosis not present

## 2020-09-10 DIAGNOSIS — T8453XA Infection and inflammatory reaction due to internal right knee prosthesis, initial encounter: Secondary | ICD-10-CM | POA: Diagnosis not present

## 2020-09-10 DIAGNOSIS — M1712 Unilateral primary osteoarthritis, left knee: Secondary | ICD-10-CM | POA: Diagnosis not present

## 2020-09-10 DIAGNOSIS — M1030 Gout due to renal impairment, unspecified site: Secondary | ICD-10-CM | POA: Diagnosis not present

## 2020-09-10 DIAGNOSIS — M86151 Other acute osteomyelitis, right femur: Secondary | ICD-10-CM | POA: Diagnosis not present

## 2020-09-10 DIAGNOSIS — E1122 Type 2 diabetes mellitus with diabetic chronic kidney disease: Secondary | ICD-10-CM | POA: Diagnosis not present

## 2020-09-10 DIAGNOSIS — N183 Chronic kidney disease, stage 3 unspecified: Secondary | ICD-10-CM | POA: Diagnosis not present

## 2020-09-10 DIAGNOSIS — I129 Hypertensive chronic kidney disease with stage 1 through stage 4 chronic kidney disease, or unspecified chronic kidney disease: Secondary | ICD-10-CM | POA: Diagnosis not present

## 2020-09-10 DIAGNOSIS — D631 Anemia in chronic kidney disease: Secondary | ICD-10-CM | POA: Diagnosis not present

## 2020-09-12 DIAGNOSIS — M1030 Gout due to renal impairment, unspecified site: Secondary | ICD-10-CM | POA: Diagnosis not present

## 2020-09-12 DIAGNOSIS — N183 Chronic kidney disease, stage 3 unspecified: Secondary | ICD-10-CM | POA: Diagnosis not present

## 2020-09-12 DIAGNOSIS — M86151 Other acute osteomyelitis, right femur: Secondary | ICD-10-CM | POA: Diagnosis not present

## 2020-09-12 DIAGNOSIS — M1712 Unilateral primary osteoarthritis, left knee: Secondary | ICD-10-CM | POA: Diagnosis not present

## 2020-09-12 DIAGNOSIS — E1169 Type 2 diabetes mellitus with other specified complication: Secondary | ICD-10-CM | POA: Diagnosis not present

## 2020-09-12 DIAGNOSIS — D631 Anemia in chronic kidney disease: Secondary | ICD-10-CM | POA: Diagnosis not present

## 2020-09-12 DIAGNOSIS — T8453XA Infection and inflammatory reaction due to internal right knee prosthesis, initial encounter: Secondary | ICD-10-CM | POA: Diagnosis not present

## 2020-09-12 DIAGNOSIS — I129 Hypertensive chronic kidney disease with stage 1 through stage 4 chronic kidney disease, or unspecified chronic kidney disease: Secondary | ICD-10-CM | POA: Diagnosis not present

## 2020-09-12 DIAGNOSIS — E1122 Type 2 diabetes mellitus with diabetic chronic kidney disease: Secondary | ICD-10-CM | POA: Diagnosis not present

## 2020-09-14 ENCOUNTER — Other Ambulatory Visit: Payer: Self-pay | Admitting: Family Medicine

## 2020-09-14 DIAGNOSIS — E1122 Type 2 diabetes mellitus with diabetic chronic kidney disease: Secondary | ICD-10-CM | POA: Diagnosis not present

## 2020-09-14 DIAGNOSIS — M1712 Unilateral primary osteoarthritis, left knee: Secondary | ICD-10-CM | POA: Diagnosis not present

## 2020-09-14 DIAGNOSIS — E1169 Type 2 diabetes mellitus with other specified complication: Secondary | ICD-10-CM | POA: Diagnosis not present

## 2020-09-14 DIAGNOSIS — D631 Anemia in chronic kidney disease: Secondary | ICD-10-CM | POA: Diagnosis not present

## 2020-09-14 DIAGNOSIS — I129 Hypertensive chronic kidney disease with stage 1 through stage 4 chronic kidney disease, or unspecified chronic kidney disease: Secondary | ICD-10-CM | POA: Diagnosis not present

## 2020-09-14 DIAGNOSIS — T8453XA Infection and inflammatory reaction due to internal right knee prosthesis, initial encounter: Secondary | ICD-10-CM | POA: Diagnosis not present

## 2020-09-14 DIAGNOSIS — N183 Chronic kidney disease, stage 3 unspecified: Secondary | ICD-10-CM | POA: Diagnosis not present

## 2020-09-14 DIAGNOSIS — M1030 Gout due to renal impairment, unspecified site: Secondary | ICD-10-CM | POA: Diagnosis not present

## 2020-09-14 DIAGNOSIS — M86151 Other acute osteomyelitis, right femur: Secondary | ICD-10-CM | POA: Diagnosis not present

## 2020-09-17 DIAGNOSIS — E1169 Type 2 diabetes mellitus with other specified complication: Secondary | ICD-10-CM | POA: Diagnosis not present

## 2020-09-17 DIAGNOSIS — M86151 Other acute osteomyelitis, right femur: Secondary | ICD-10-CM | POA: Diagnosis not present

## 2020-09-17 DIAGNOSIS — D631 Anemia in chronic kidney disease: Secondary | ICD-10-CM | POA: Diagnosis not present

## 2020-09-17 DIAGNOSIS — I129 Hypertensive chronic kidney disease with stage 1 through stage 4 chronic kidney disease, or unspecified chronic kidney disease: Secondary | ICD-10-CM | POA: Diagnosis not present

## 2020-09-17 DIAGNOSIS — N183 Chronic kidney disease, stage 3 unspecified: Secondary | ICD-10-CM | POA: Diagnosis not present

## 2020-09-17 DIAGNOSIS — M1030 Gout due to renal impairment, unspecified site: Secondary | ICD-10-CM | POA: Diagnosis not present

## 2020-09-17 DIAGNOSIS — M1712 Unilateral primary osteoarthritis, left knee: Secondary | ICD-10-CM | POA: Diagnosis not present

## 2020-09-17 DIAGNOSIS — T8453XA Infection and inflammatory reaction due to internal right knee prosthesis, initial encounter: Secondary | ICD-10-CM | POA: Diagnosis not present

## 2020-09-17 DIAGNOSIS — E1122 Type 2 diabetes mellitus with diabetic chronic kidney disease: Secondary | ICD-10-CM | POA: Diagnosis not present

## 2020-09-19 ENCOUNTER — Telehealth: Payer: Self-pay

## 2020-09-19 NOTE — Telephone Encounter (Signed)
I think she is ok to wait so long as she has been on antibiotics. Dr. Johnnye Sima saw her in the hospital last month

## 2020-09-19 NOTE — Telephone Encounter (Signed)
Patient's daughter called office regarding follow up appointment. States patient had surgery with Dr. Maureen Ralphs 3 weeks ago and was asked to follow up with ID after two weeks. MD does not have any open appointments until 11/16. Daughter would like to know if pt needs to be seen earlier then November or okay to wait.  [place]

## 2020-09-20 NOTE — Telephone Encounter (Signed)
Left voicemail with Mardene Celeste requesting call back to scheduled follow up appointment.  [place]

## 2020-09-21 DIAGNOSIS — M86151 Other acute osteomyelitis, right femur: Secondary | ICD-10-CM | POA: Diagnosis not present

## 2020-09-21 DIAGNOSIS — E1169 Type 2 diabetes mellitus with other specified complication: Secondary | ICD-10-CM | POA: Diagnosis not present

## 2020-09-21 DIAGNOSIS — M1030 Gout due to renal impairment, unspecified site: Secondary | ICD-10-CM | POA: Diagnosis not present

## 2020-09-21 DIAGNOSIS — M1712 Unilateral primary osteoarthritis, left knee: Secondary | ICD-10-CM | POA: Diagnosis not present

## 2020-09-21 DIAGNOSIS — T8453XA Infection and inflammatory reaction due to internal right knee prosthesis, initial encounter: Secondary | ICD-10-CM | POA: Diagnosis not present

## 2020-09-21 DIAGNOSIS — I129 Hypertensive chronic kidney disease with stage 1 through stage 4 chronic kidney disease, or unspecified chronic kidney disease: Secondary | ICD-10-CM | POA: Diagnosis not present

## 2020-09-21 DIAGNOSIS — E1122 Type 2 diabetes mellitus with diabetic chronic kidney disease: Secondary | ICD-10-CM | POA: Diagnosis not present

## 2020-09-21 DIAGNOSIS — D631 Anemia in chronic kidney disease: Secondary | ICD-10-CM | POA: Diagnosis not present

## 2020-09-21 DIAGNOSIS — N183 Chronic kidney disease, stage 3 unspecified: Secondary | ICD-10-CM | POA: Diagnosis not present

## 2020-09-25 DIAGNOSIS — E1122 Type 2 diabetes mellitus with diabetic chronic kidney disease: Secondary | ICD-10-CM | POA: Diagnosis not present

## 2020-09-25 DIAGNOSIS — M86151 Other acute osteomyelitis, right femur: Secondary | ICD-10-CM | POA: Diagnosis not present

## 2020-09-25 DIAGNOSIS — D631 Anemia in chronic kidney disease: Secondary | ICD-10-CM | POA: Diagnosis not present

## 2020-09-25 DIAGNOSIS — I129 Hypertensive chronic kidney disease with stage 1 through stage 4 chronic kidney disease, or unspecified chronic kidney disease: Secondary | ICD-10-CM | POA: Diagnosis not present

## 2020-09-25 DIAGNOSIS — M1712 Unilateral primary osteoarthritis, left knee: Secondary | ICD-10-CM | POA: Diagnosis not present

## 2020-09-25 DIAGNOSIS — E1169 Type 2 diabetes mellitus with other specified complication: Secondary | ICD-10-CM | POA: Diagnosis not present

## 2020-09-25 DIAGNOSIS — M1030 Gout due to renal impairment, unspecified site: Secondary | ICD-10-CM | POA: Diagnosis not present

## 2020-09-25 DIAGNOSIS — N183 Chronic kidney disease, stage 3 unspecified: Secondary | ICD-10-CM | POA: Diagnosis not present

## 2020-09-25 DIAGNOSIS — T8453XA Infection and inflammatory reaction due to internal right knee prosthesis, initial encounter: Secondary | ICD-10-CM | POA: Diagnosis not present

## 2020-09-27 DIAGNOSIS — M009 Pyogenic arthritis, unspecified: Secondary | ICD-10-CM | POA: Diagnosis not present

## 2020-09-27 DIAGNOSIS — R32 Unspecified urinary incontinence: Secondary | ICD-10-CM | POA: Diagnosis not present

## 2020-09-27 DIAGNOSIS — D631 Anemia in chronic kidney disease: Secondary | ICD-10-CM | POA: Diagnosis not present

## 2020-09-27 DIAGNOSIS — M549 Dorsalgia, unspecified: Secondary | ICD-10-CM | POA: Diagnosis not present

## 2020-09-27 DIAGNOSIS — N183 Chronic kidney disease, stage 3 unspecified: Secondary | ICD-10-CM | POA: Diagnosis not present

## 2020-09-27 DIAGNOSIS — M86151 Other acute osteomyelitis, right femur: Secondary | ICD-10-CM | POA: Diagnosis not present

## 2020-09-27 DIAGNOSIS — E1122 Type 2 diabetes mellitus with diabetic chronic kidney disease: Secondary | ICD-10-CM | POA: Diagnosis not present

## 2020-09-27 DIAGNOSIS — Z4789 Encounter for other orthopedic aftercare: Secondary | ICD-10-CM | POA: Diagnosis not present

## 2020-09-27 DIAGNOSIS — L989 Disorder of the skin and subcutaneous tissue, unspecified: Secondary | ICD-10-CM | POA: Diagnosis not present

## 2020-09-27 DIAGNOSIS — M1712 Unilateral primary osteoarthritis, left knee: Secondary | ICD-10-CM | POA: Diagnosis not present

## 2020-09-27 DIAGNOSIS — T8453XA Infection and inflammatory reaction due to internal right knee prosthesis, initial encounter: Secondary | ICD-10-CM | POA: Diagnosis not present

## 2020-09-27 DIAGNOSIS — M869 Osteomyelitis, unspecified: Secondary | ICD-10-CM | POA: Diagnosis not present

## 2020-09-27 DIAGNOSIS — M1030 Gout due to renal impairment, unspecified site: Secondary | ICD-10-CM | POA: Diagnosis not present

## 2020-09-27 DIAGNOSIS — I129 Hypertensive chronic kidney disease with stage 1 through stage 4 chronic kidney disease, or unspecified chronic kidney disease: Secondary | ICD-10-CM | POA: Diagnosis not present

## 2020-09-27 DIAGNOSIS — E1169 Type 2 diabetes mellitus with other specified complication: Secondary | ICD-10-CM | POA: Diagnosis not present

## 2020-10-01 DIAGNOSIS — M86151 Other acute osteomyelitis, right femur: Secondary | ICD-10-CM | POA: Diagnosis not present

## 2020-10-01 DIAGNOSIS — E1169 Type 2 diabetes mellitus with other specified complication: Secondary | ICD-10-CM | POA: Diagnosis not present

## 2020-10-01 DIAGNOSIS — I129 Hypertensive chronic kidney disease with stage 1 through stage 4 chronic kidney disease, or unspecified chronic kidney disease: Secondary | ICD-10-CM | POA: Diagnosis not present

## 2020-10-01 DIAGNOSIS — E1122 Type 2 diabetes mellitus with diabetic chronic kidney disease: Secondary | ICD-10-CM | POA: Diagnosis not present

## 2020-10-01 DIAGNOSIS — M1712 Unilateral primary osteoarthritis, left knee: Secondary | ICD-10-CM | POA: Diagnosis not present

## 2020-10-01 DIAGNOSIS — D631 Anemia in chronic kidney disease: Secondary | ICD-10-CM | POA: Diagnosis not present

## 2020-10-01 DIAGNOSIS — M1030 Gout due to renal impairment, unspecified site: Secondary | ICD-10-CM | POA: Diagnosis not present

## 2020-10-01 DIAGNOSIS — T8453XA Infection and inflammatory reaction due to internal right knee prosthesis, initial encounter: Secondary | ICD-10-CM | POA: Diagnosis not present

## 2020-10-01 DIAGNOSIS — N183 Chronic kidney disease, stage 3 unspecified: Secondary | ICD-10-CM | POA: Diagnosis not present

## 2020-10-03 ENCOUNTER — Other Ambulatory Visit: Payer: Self-pay | Admitting: Family Medicine

## 2020-10-03 DIAGNOSIS — I129 Hypertensive chronic kidney disease with stage 1 through stage 4 chronic kidney disease, or unspecified chronic kidney disease: Secondary | ICD-10-CM | POA: Diagnosis not present

## 2020-10-03 DIAGNOSIS — M1712 Unilateral primary osteoarthritis, left knee: Secondary | ICD-10-CM | POA: Diagnosis not present

## 2020-10-03 DIAGNOSIS — E1169 Type 2 diabetes mellitus with other specified complication: Secondary | ICD-10-CM | POA: Diagnosis not present

## 2020-10-03 DIAGNOSIS — E1122 Type 2 diabetes mellitus with diabetic chronic kidney disease: Secondary | ICD-10-CM | POA: Diagnosis not present

## 2020-10-03 DIAGNOSIS — D631 Anemia in chronic kidney disease: Secondary | ICD-10-CM | POA: Diagnosis not present

## 2020-10-03 DIAGNOSIS — T8453XA Infection and inflammatory reaction due to internal right knee prosthesis, initial encounter: Secondary | ICD-10-CM | POA: Diagnosis not present

## 2020-10-03 DIAGNOSIS — M1030 Gout due to renal impairment, unspecified site: Secondary | ICD-10-CM | POA: Diagnosis not present

## 2020-10-03 DIAGNOSIS — L02415 Cutaneous abscess of right lower limb: Secondary | ICD-10-CM | POA: Diagnosis not present

## 2020-10-03 DIAGNOSIS — M86151 Other acute osteomyelitis, right femur: Secondary | ICD-10-CM | POA: Diagnosis not present

## 2020-10-03 DIAGNOSIS — N183 Chronic kidney disease, stage 3 unspecified: Secondary | ICD-10-CM | POA: Diagnosis not present

## 2020-10-04 DIAGNOSIS — E1169 Type 2 diabetes mellitus with other specified complication: Secondary | ICD-10-CM | POA: Diagnosis not present

## 2020-10-04 DIAGNOSIS — N183 Chronic kidney disease, stage 3 unspecified: Secondary | ICD-10-CM | POA: Diagnosis not present

## 2020-10-04 DIAGNOSIS — M1712 Unilateral primary osteoarthritis, left knee: Secondary | ICD-10-CM | POA: Diagnosis not present

## 2020-10-04 DIAGNOSIS — M86151 Other acute osteomyelitis, right femur: Secondary | ICD-10-CM | POA: Diagnosis not present

## 2020-10-04 DIAGNOSIS — I129 Hypertensive chronic kidney disease with stage 1 through stage 4 chronic kidney disease, or unspecified chronic kidney disease: Secondary | ICD-10-CM | POA: Diagnosis not present

## 2020-10-04 DIAGNOSIS — M1030 Gout due to renal impairment, unspecified site: Secondary | ICD-10-CM | POA: Diagnosis not present

## 2020-10-04 DIAGNOSIS — E1122 Type 2 diabetes mellitus with diabetic chronic kidney disease: Secondary | ICD-10-CM | POA: Diagnosis not present

## 2020-10-04 DIAGNOSIS — T8453XA Infection and inflammatory reaction due to internal right knee prosthesis, initial encounter: Secondary | ICD-10-CM | POA: Diagnosis not present

## 2020-10-04 DIAGNOSIS — D631 Anemia in chronic kidney disease: Secondary | ICD-10-CM | POA: Diagnosis not present

## 2020-10-10 ENCOUNTER — Telehealth: Payer: Self-pay

## 2020-10-10 ENCOUNTER — Encounter: Payer: Self-pay | Admitting: *Deleted

## 2020-10-10 DIAGNOSIS — T8453XA Infection and inflammatory reaction due to internal right knee prosthesis, initial encounter: Secondary | ICD-10-CM | POA: Diagnosis not present

## 2020-10-10 DIAGNOSIS — I129 Hypertensive chronic kidney disease with stage 1 through stage 4 chronic kidney disease, or unspecified chronic kidney disease: Secondary | ICD-10-CM | POA: Diagnosis not present

## 2020-10-10 DIAGNOSIS — M1030 Gout due to renal impairment, unspecified site: Secondary | ICD-10-CM | POA: Diagnosis not present

## 2020-10-10 DIAGNOSIS — E1122 Type 2 diabetes mellitus with diabetic chronic kidney disease: Secondary | ICD-10-CM | POA: Diagnosis not present

## 2020-10-10 DIAGNOSIS — M86151 Other acute osteomyelitis, right femur: Secondary | ICD-10-CM | POA: Diagnosis not present

## 2020-10-10 DIAGNOSIS — M1712 Unilateral primary osteoarthritis, left knee: Secondary | ICD-10-CM | POA: Diagnosis not present

## 2020-10-10 DIAGNOSIS — D631 Anemia in chronic kidney disease: Secondary | ICD-10-CM | POA: Diagnosis not present

## 2020-10-10 DIAGNOSIS — E1169 Type 2 diabetes mellitus with other specified complication: Secondary | ICD-10-CM | POA: Diagnosis not present

## 2020-10-10 DIAGNOSIS — N183 Chronic kidney disease, stage 3 unspecified: Secondary | ICD-10-CM | POA: Diagnosis not present

## 2020-10-10 NOTE — Chronic Care Management (AMB) (Signed)
  Chronic Care Management   Note  10/10/2020 Name: MIKHIA DUSEK MRN: [id number] DOB: August 12, 1946  Beth [name] is a 74 y.o. year old female who is a primary care patient of Stacks, Cletus Gash, MD. I reached out to [name] by phone today in response to a referral sent by Ms. Maryann Alar Gieselman's health plan.     Ms. Myers was given information about Chronic Care Management services today including:  1. CCM service includes personalized support from designated clinical staff supervised by her physician, including individualized plan of care and coordination with other care providers 2. 24/7 contact phone numbers for assistance for urgent and routine care needs. 3. Service will only be billed when office clinical staff spend 20 minutes or more in a month to coordinate care. 4. Only one practitioner may furnish and bill the service in a calendar month. 5. The patient may stop CCM services at any time (effective at the end of the month) by phone call to the office staff. 6. The patient will be responsible for cost sharing (co-pay) of up to 20% of the service fee (after annual deductible is met).  Patient agreed to services and verbal consent obtained.   Follow up plan: Telephone appointment with care management team member scheduled for:11/13/2020  Noreene Larsson, [place], [place], [place] [address] Direct Dial: [phone] [email or web address]

## 2020-10-10 NOTE — Progress Notes (Signed)
Pt has been r/s  

## 2020-10-16 DIAGNOSIS — T8459XD Infection and inflammatory reaction due to other internal joint prosthesis, subsequent encounter: Secondary | ICD-10-CM | POA: Diagnosis not present

## 2020-10-16 DIAGNOSIS — Z96651 Presence of right artificial knee joint: Secondary | ICD-10-CM | POA: Diagnosis not present

## 2020-10-18 DIAGNOSIS — T8453XA Infection and inflammatory reaction due to internal right knee prosthesis, initial encounter: Secondary | ICD-10-CM | POA: Diagnosis not present

## 2020-10-18 DIAGNOSIS — M86151 Other acute osteomyelitis, right femur: Secondary | ICD-10-CM | POA: Diagnosis not present

## 2020-10-18 DIAGNOSIS — N183 Chronic kidney disease, stage 3 unspecified: Secondary | ICD-10-CM | POA: Diagnosis not present

## 2020-10-18 DIAGNOSIS — D631 Anemia in chronic kidney disease: Secondary | ICD-10-CM | POA: Diagnosis not present

## 2020-10-18 DIAGNOSIS — I129 Hypertensive chronic kidney disease with stage 1 through stage 4 chronic kidney disease, or unspecified chronic kidney disease: Secondary | ICD-10-CM | POA: Diagnosis not present

## 2020-10-18 DIAGNOSIS — M1030 Gout due to renal impairment, unspecified site: Secondary | ICD-10-CM | POA: Diagnosis not present

## 2020-10-18 DIAGNOSIS — M1712 Unilateral primary osteoarthritis, left knee: Secondary | ICD-10-CM | POA: Diagnosis not present

## 2020-10-18 DIAGNOSIS — E1169 Type 2 diabetes mellitus with other specified complication: Secondary | ICD-10-CM | POA: Diagnosis not present

## 2020-10-18 DIAGNOSIS — E1122 Type 2 diabetes mellitus with diabetic chronic kidney disease: Secondary | ICD-10-CM | POA: Diagnosis not present

## 2020-10-28 DIAGNOSIS — M549 Dorsalgia, unspecified: Secondary | ICD-10-CM | POA: Diagnosis not present

## 2020-10-28 DIAGNOSIS — R32 Unspecified urinary incontinence: Secondary | ICD-10-CM | POA: Diagnosis not present

## 2020-10-28 DIAGNOSIS — M869 Osteomyelitis, unspecified: Secondary | ICD-10-CM | POA: Diagnosis not present

## 2020-10-28 DIAGNOSIS — M009 Pyogenic arthritis, unspecified: Secondary | ICD-10-CM | POA: Diagnosis not present

## 2020-10-28 DIAGNOSIS — Z4789 Encounter for other orthopedic aftercare: Secondary | ICD-10-CM | POA: Diagnosis not present

## 2020-10-28 DIAGNOSIS — L989 Disorder of the skin and subcutaneous tissue, unspecified: Secondary | ICD-10-CM | POA: Diagnosis not present

## 2020-10-29 ENCOUNTER — Other Ambulatory Visit: Payer: Self-pay | Admitting: Family Medicine

## 2020-10-30 ENCOUNTER — Ambulatory Visit (INDEPENDENT_AMBULATORY_CARE_PROVIDER_SITE_OTHER): Payer: Medicare HMO

## 2020-10-30 ENCOUNTER — Other Ambulatory Visit: Payer: Self-pay

## 2020-10-30 DIAGNOSIS — Z23 Encounter for immunization: Secondary | ICD-10-CM

## 2020-11-13 ENCOUNTER — Telehealth: Payer: Medicare HMO

## 2020-11-13 DIAGNOSIS — Z4789 Encounter for other orthopedic aftercare: Secondary | ICD-10-CM | POA: Diagnosis not present

## 2020-11-13 DIAGNOSIS — T8453XD Infection and inflammatory reaction due to internal right knee prosthesis, subsequent encounter: Secondary | ICD-10-CM | POA: Diagnosis not present

## 2020-11-13 DIAGNOSIS — T8459XD Infection and inflammatory reaction due to other internal joint prosthesis, subsequent encounter: Secondary | ICD-10-CM | POA: Diagnosis not present

## 2020-11-15 ENCOUNTER — Ambulatory Visit: Payer: Medicare HMO | Admitting: Family Medicine

## 2020-11-15 DIAGNOSIS — E782 Mixed hyperlipidemia: Secondary | ICD-10-CM

## 2020-11-15 DIAGNOSIS — E119 Type 2 diabetes mellitus without complications: Secondary | ICD-10-CM

## 2020-11-15 DIAGNOSIS — I10 Essential (primary) hypertension: Secondary | ICD-10-CM

## 2020-11-18 ENCOUNTER — Other Ambulatory Visit: Payer: Self-pay | Admitting: Family Medicine

## 2020-11-23 ENCOUNTER — Other Ambulatory Visit: Payer: Self-pay | Admitting: Family Medicine

## 2020-11-27 DIAGNOSIS — M549 Dorsalgia, unspecified: Secondary | ICD-10-CM | POA: Diagnosis not present

## 2020-11-27 DIAGNOSIS — Z4789 Encounter for other orthopedic aftercare: Secondary | ICD-10-CM | POA: Diagnosis not present

## 2020-11-27 DIAGNOSIS — L989 Disorder of the skin and subcutaneous tissue, unspecified: Secondary | ICD-10-CM | POA: Diagnosis not present

## 2020-11-27 DIAGNOSIS — M869 Osteomyelitis, unspecified: Secondary | ICD-10-CM | POA: Diagnosis not present

## 2020-11-27 DIAGNOSIS — R32 Unspecified urinary incontinence: Secondary | ICD-10-CM | POA: Diagnosis not present

## 2020-11-27 DIAGNOSIS — M009 Pyogenic arthritis, unspecified: Secondary | ICD-10-CM | POA: Diagnosis not present

## 2020-11-29 ENCOUNTER — Ambulatory Visit: Payer: Medicare HMO | Admitting: *Deleted

## 2020-11-29 DIAGNOSIS — E119 Type 2 diabetes mellitus without complications: Secondary | ICD-10-CM

## 2020-11-29 DIAGNOSIS — I10 Essential (primary) hypertension: Secondary | ICD-10-CM

## 2020-11-29 NOTE — Chronic Care Management (AMB) (Signed)
Chronic Care Management   Initial Visit Note  11/29/2020 Name: Beth [name] MRN: [id number] DOB: 1946/08/30  Referred by: Claretta Fraise, MD Reason for referral : Chronic Care Management (Initial Visit)   Beth [name] is a 74 y.o. year old female who is a primary care patient of Stacks, Cletus Gash, MD. The CCM team was consulted for assistance with chronic disease management and care coordination needs related to HTN and DMII  Review of patient status, including review of consultants reports, relevant laboratory and other test results, and collaboration with appropriate care team members and the patient's provider was performed as part of comprehensive patient evaluation and provision of chronic care management services.    SDOH (Social Determinants of Health) assessments performed: Yes See Care Plan activities for detailed interventions related to SDOH     Medications: Outpatient Encounter Medications as of 11/29/2020  Medication Sig  . acetaminophen (TYLENOL) 500 MG tablet Take 2 tablets (1,000 mg total) by mouth every 6 (six) hours as needed for mild pain.  . Alcohol Swabs (B-D SINGLE USE SWABS REGULAR) PADS Use to check glucose up to four times daily Dx E11.9  . amLODipine (NORVASC) 10 MG tablet TAKE 1 TABLET DAILY IN THE EVENING (Patient taking differently: Take 10 mg by mouth daily. )  . atorvastatin (LIPITOR) 20 MG tablet TAKE 1 TABLET DAILY IN THE EVENING  . Blood Glucose Calibration (TRUE METRIX LEVEL 1) Low SOLN Use with glucose monitor Dx E11.9  . Blood Glucose Monitoring Suppl (TRUE METRIX AIR GLUCOSE METER) w/Device KIT 1 Device by Does not apply route 4 (four) times daily. Dx E11.9  . diltiazem (CARDIZEM CD) 240 MG 24 hr capsule TAKE 1 CAPSULE EVERY DAY  . ferrous sulfate 325 (65 FE) MG tablet Take 325 mg by mouth daily.  (Patient not taking: Reported on 08/16/2020)  . fexofenadine (ALLEGRA) 180 MG tablet Take 1 tablet (180 mg total) by mouth daily. For allergy symptoms  .  glucose blood (TRUE METRIX BLOOD GLUCOSE TEST) test strip Use to check glucose up to four times daily  . hydrALAZINE (APRESOLINE) 100 MG tablet TAKE ONE (1) TABLET THREE (3) TIMES EACH DAY  . HYDROcodone-acetaminophen (NORCO/VICODIN) 5-325 MG tablet Take 1-2 tablets by mouth every 6 (six) hours as needed for severe pain.  . isosorbide dinitrate (ISORDIL) 10 MG tablet TAKE 1 TABLET THREE TIMES DAILY  . metFORMIN (GLUCOPHAGE-XR) 500 MG 24 hr tablet TAKE 1 TABLET EVERY DAY  . methocarbamol (ROBAXIN) 500 MG tablet Take 1 tablet (500 mg total) by mouth every 6 (six) hours as needed for muscle spasms.  . Nebivolol HCl 20 MG TABS TAKE 1 TABLET EVERY DAY  . olmesartan (BENICAR) 40 MG tablet TAKE ONE (1) TABLET EACH DAY  . rivaroxaban (XARELTO) 10 MG TABS tablet Take 1 tablet (10 mg total) by mouth daily with breakfast for 20 days. Then take one 81 mg aspirin once a day for three weeks. Then discontinue aspirin.  . traMADol (ULTRAM) 50 MG tablet Take 1-2 tablets (50-100 mg total) by mouth every 6 (six) hours as needed for moderate pain.  . TRUEplus Lancets 33G MISC Test QID Dx E11.9  . [DISCONTINUED] TRADJENTA 5 MG TABS tablet TAKE 1 TABLET EVERY DAY   Facility-Administered Encounter Medications as of 11/29/2020  Medication  . tranexamic acid (CYKLOKAPRON) 2,000 mg in sodium chloride 0.9 % 50 mL Topical Application     Objective:  Lab Results  Component Value Date   HGBA1C 5.8 (H) 08/27/2020   HGBA1C  5.4 08/16/2020   HGBA1C 5.6 03/29/2020   Lab Results  Component Value Date   LDLCALC 65 03/29/2020   CREATININE 0.94 08/29/2020   BP Readings from Last 3 Encounters:  08/29/20 (!) 169/82  08/21/20 (!) 158/82  08/16/20 140/73    Patient Care Plan: Hypertension (Adult)    Problem Identified: Hypertension (Hypertension)   Priority: High    Long-Range Goal: Hypertension Monitored   This Visit's Progress: On track  Priority: High  Note:   Current Barriers:  . Chronic Disease Management  support and education needs related to hypertension . Mobility limited due to right knee surgical immobilization  Nurse Case Manager Clinical Goal(s):  Marland Kitchen Over the next 30 days, patient will work with Consulting civil engineer to address needs related to self management of hypertension . Over the next 90 days, patient will take all medication as prescribed . Over the next 90 days, patient will call PCP with any blood pressure readings outside of recommended range  Interventions:  . 1:1 collaboration with Claretta Fraise, MD regarding development and update of comprehensive plan of care as evidenced by provider attestation and co-signature . Inter-disciplinary care team collaboration (see longitudinal plan of care) . Evaluation of current treatment plan related to hypertension and patient's adherence to plan as established by provider. . Chart reviewed including relevant office notes and lab results . Reviewed and discussed medications . Discussed home blood pressure monitoring o Monitors and records BP daily o Averages 140s/80s . Discussed diet and exercise . Discussed mobility o Uses walker and wheelchair due to right knee immobilization . Reviewed upcoming appointments: Dr Livia Snellen 12/11/20 . Provided with RN Care Manager contact number and encouraged to reach out as needed  Patient Goals/Self-Care Activities Over the next 30 days, patient will:  - check blood pressure daily - write blood pressure results in a log or diary  - bring blood pressure log to doctor's visits - call PCP with any readings outside of recommended range - take medications as prescribed     Patient Care Plan: Diabetes Type 2 (Adult)    Problem Identified: Self Management of Diabetes   Priority: Medium    Long-Range Goal: Self Management of Diabetes Optimized   This Visit's Progress: On track  Priority: Medium  Note:   Current Barriers:  . Chronic Disease Management support and education needs related to  diabetes . Decreased mobility due to surgical immobilization of right knee  Nurse Case Manager Clinical Goal(s):  Marland Kitchen Over the next 30 days, patient will verbalize understanding of plan for diabetes management . Over the next 30 days, patient will work with Consulting civil engineer to address needs related to self-management of diabetes  Interventions:  . 1:1 collaboration with Claretta Fraise, MD regarding development and update of comprehensive plan of care as evidenced by provider attestation and co-signature . Inter-disciplinary care team collaboration (see longitudinal plan of care) . Evaluation of current treatment plan related to diabetes and patient's adherence to plan as established by provider. . Reviewed and discussed medications o Metformin . Chart reviewed including recent office notes and lab results o Discussed most recent A1C of 5.8 . Discussed home blood sugar monitoring o Checks daily o Averages about 130 . Diet and exercise discussed . Reviewed upcoming appointments: Dr Livia Snellen 12/11/20 . Provided with RN Care manager contact number and encouraged to reach out as needed  Patient Goals/Self-Care Activities Over the next 30 days, patient will: - check blood sugar at prescribed times - check blood sugar  if I feel it is too high or too low - enter blood sugar readings and medication or insulin into daily log - take the blood sugar log to all doctor visits - take the blood sugar meter to all doctor visits  - take medications as prescribed - carb modified diet - call PCP with any readings outside of recommended range - schedule eye exam - keep appt with podiatrist     Follow Up Plan: Telephone follow up appointment with care management team member scheduled for: 12/20/20 with RN Care Manager The patient has been provided with contact information for the care management team and has been advised to call with any health related questions or concerns.  Next PCP appointment scheduled  for: 12/11/2020 with Dr Livia Snellen  Chong Sicilian, BSN, RN-BC [place] / [place] Management Direct Dial: [phone]

## 2020-11-29 NOTE — Patient Instructions (Signed)
Visit Information  Goals Addressed            This Visit's Progress   . Monitor and Manage My Blood Sugar-Diabetes Type 2       Timeframe:  Long-Range Goal Priority:  Medium Start Date:                             Expected End Date:                       Follow Up Date 12/20/20    - check blood sugar at prescribed times - check blood sugar if I feel it is too high or too low - enter blood sugar readings and medication or insulin into daily log - take the blood sugar log to all doctor visits - take the blood sugar meter to all doctor visits  - take medications as prescribed - carb modified diet - call PCP with any readings outside of recommended range   Why is this important?    Checking your blood sugar at home helps to keep it from getting very high or very low.   Writing the results in a diary or log helps the doctor know how to care for you.   Your blood sugar log should have the time, date and the results.   Also, write down the amount of insulin or other medicine that you take.   Other information, like what you ate, exercise done and how you were feeling, will also be helpful.     Notes:     . Obtain Eye Exam-Diabetes Type 2       Follow Up Date 12/20/2020    - schedule appointment with eye doctor    Why is this important?    Eye check-ups are important when you have diabetes.   Vision loss can be prevented.    Notes:     . Track and Manage My Blood Pressure-Hypertension       Timeframe:  Long-Range Goal Priority:  High Start Date:                             Expected End Date:                       Follow Up Date 12/20/2020    - check blood pressure daily - write blood pressure results in a log or diary  - bring blood pressure log to doctor's visits - call PCP with any readings outside of recommended range - take medications as prescribed    Why is this important?    You won't feel high blood pressure, but it can still hurt your blood vessels.    High blood pressure can cause heart or kidney problems. It can also cause a stroke.   Making lifestyle changes like losing a little weight or eating less salt will help.   Checking your blood pressure at home and at different times of the day can help to control blood pressure.   If the doctor prescribes medicine remember to take it the way the doctor ordered.   Call the office if you cannot afford the medicine or if there are questions about it.     Notes:        Ms. Barsky was given information about Chronic Care Management services including:  1. CCM service includes  personalized support from designated clinical staff supervised by her physician, including individualized plan of care and coordination with other care providers 2. 24/7 contact phone numbers for assistance for urgent and routine care needs. 3. Service will only be billed when office clinical staff spend 20 minutes or more in a month to coordinate care. 4. Only one practitioner may furnish and bill the service in a calendar month. 5. The patient may stop CCM services at any time (effective at the end of the month) by phone call to the office staff. 6. The patient will be responsible for cost sharing (co-pay) of up to 20% of the service fee (after annual deductible is met).  Patient agreed to services and verbal consent obtained.   The patient verbalized understanding of instructions, educational materials, and care plan provided today and declined offer to receive copy of patient instructions, educational materials, and care plan.    Follow Up Plan: Telephone follow up appointment with care management team member scheduled for: 12/20/20 with RN Care Manager The patient has been provided with contact information for the care management team and has been advised to call with any health related questions or concerns.  Next PCP appointment scheduled for: 12/11/2020 with Dr Livia Snellen  Chong Sicilian, BSN, RN-BC [place] / [place] Management Direct Dial: [phone]

## 2020-12-04 DIAGNOSIS — M79676 Pain in unspecified toe(s): Secondary | ICD-10-CM | POA: Diagnosis not present

## 2020-12-04 DIAGNOSIS — L84 Corns and callosities: Secondary | ICD-10-CM | POA: Diagnosis not present

## 2020-12-04 DIAGNOSIS — B351 Tinea unguium: Secondary | ICD-10-CM | POA: Diagnosis not present

## 2020-12-04 DIAGNOSIS — E1142 Type 2 diabetes mellitus with diabetic polyneuropathy: Secondary | ICD-10-CM | POA: Diagnosis not present

## 2020-12-10 ENCOUNTER — Ambulatory Visit: Payer: Medicare HMO | Admitting: Infectious Disease

## 2020-12-11 ENCOUNTER — Ambulatory Visit: Payer: Medicare HMO | Admitting: Family Medicine

## 2020-12-20 ENCOUNTER — Ambulatory Visit: Payer: Medicare HMO | Admitting: *Deleted

## 2020-12-20 NOTE — Chronic Care Management (AMB) (Signed)
   12/20/2020  EMERSYNN DEATLEY 07/18/1946 [id number]   I reached out to Ms Melfi today by telephone for a CCM follow-up. Ms Caban appreciated the call but asked that I call back at another time. Patient stated, "I just lost my daughter and I don't feel like talking right now." RN expressed sympathy and asked patient to let us know if we can be of any assistance.   Follow-up Plan RN Care Manager will schedule follow-up telephone call within the next 30 days RNCM will talk with patient about LCSW counseling services through [facility] program PCP appointment scheduled for 01/08/21

## 2020-12-20 NOTE — Patient Instructions (Signed)
Follow-up Plan RN Care Manager will schedule follow-up telephone call within the next 30 days RNCM will talk with patient about LCSW counseling services through [facility] program PCP appointment scheduled for 01/08/21

## 2020-12-28 DIAGNOSIS — L989 Disorder of the skin and subcutaneous tissue, unspecified: Secondary | ICD-10-CM | POA: Diagnosis not present

## 2020-12-28 DIAGNOSIS — M549 Dorsalgia, unspecified: Secondary | ICD-10-CM | POA: Diagnosis not present

## 2020-12-28 DIAGNOSIS — Z4789 Encounter for other orthopedic aftercare: Secondary | ICD-10-CM | POA: Diagnosis not present

## 2020-12-28 DIAGNOSIS — M009 Pyogenic arthritis, unspecified: Secondary | ICD-10-CM | POA: Diagnosis not present

## 2020-12-28 DIAGNOSIS — M869 Osteomyelitis, unspecified: Secondary | ICD-10-CM | POA: Diagnosis not present

## 2020-12-28 DIAGNOSIS — R32 Unspecified urinary incontinence: Secondary | ICD-10-CM | POA: Diagnosis not present

## 2021-01-04 ENCOUNTER — Ambulatory Visit: Payer: Medicare HMO | Admitting: Infectious Disease

## 2021-01-08 ENCOUNTER — Ambulatory Visit (INDEPENDENT_AMBULATORY_CARE_PROVIDER_SITE_OTHER): Payer: Medicare HMO | Admitting: Family Medicine

## 2021-01-08 ENCOUNTER — Other Ambulatory Visit: Payer: Self-pay | Admitting: Family Medicine

## 2021-01-08 ENCOUNTER — Encounter: Payer: Self-pay | Admitting: Family Medicine

## 2021-01-08 ENCOUNTER — Other Ambulatory Visit: Payer: Self-pay

## 2021-01-08 VITALS — BP 131/71 | HR 65 | Temp 97.3°F

## 2021-01-08 DIAGNOSIS — I10 Essential (primary) hypertension: Secondary | ICD-10-CM

## 2021-01-08 DIAGNOSIS — D509 Iron deficiency anemia, unspecified: Secondary | ICD-10-CM | POA: Diagnosis not present

## 2021-01-08 DIAGNOSIS — E782 Mixed hyperlipidemia: Secondary | ICD-10-CM | POA: Diagnosis not present

## 2021-01-08 DIAGNOSIS — E119 Type 2 diabetes mellitus without complications: Secondary | ICD-10-CM

## 2021-01-08 LAB — BAYER DCA HB A1C WAIVED: HB A1C (BAYER DCA - WAIVED): 6 % (ref <7.0)

## 2021-01-08 MED ORDER — HYDRALAZINE HCL 100 MG PO TABS: 100.0000 mg | ORAL_TABLET | Freq: Three times a day (TID) | ORAL | 1 refills | Status: DC

## 2021-01-08 MED ORDER — NEBIVOLOL HCL 20 MG PO TABS: ORAL_TABLET | ORAL | 1 refills | Status: DC

## 2021-01-08 MED ORDER — OLMESARTAN MEDOXOMIL 40 MG PO TABS
ORAL_TABLET | ORAL | 1 refills | Status: DC
Start: 2021-01-08 — End: 2021-04-22

## 2021-01-08 MED ORDER — AMLODIPINE BESYLATE 10 MG PO TABS
10.0000 mg | ORAL_TABLET | Freq: Every evening | ORAL | 1 refills | Status: DC
Start: 2021-01-08 — End: 2021-06-06

## 2021-01-08 MED ORDER — METFORMIN HCL ER 500 MG PO TB24: 500.0000 mg | ORAL_TABLET | Freq: Every day | ORAL | 1 refills | Status: DC

## 2021-01-08 MED ORDER — ISOSORBIDE DINITRATE 10 MG PO TABS: 10.0000 mg | ORAL_TABLET | Freq: Three times a day (TID) | ORAL | 1 refills | Status: DC

## 2021-01-08 NOTE — Progress Notes (Signed)
Subjective:  Patient ID: Beth [name], female    DOB: [birth year]  Age: 75 y.o. MRN: [id number]  CC: Medical Management of Chronic Issues (3 month/)   HPI BETUL BRISKY presents forFollow-up of diabetes. Patient checks blood sugar at home.   100 fasting and 160 postprandial Patient denies symptoms such as polyuria, polydipsia, excessive hunger, nausea No significant hypoglycemic spells noted. Medications reviewed. Pt reports taking them regularly without complication/adverse reaction being reported today.  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  History Vega has a past medical history of Anemia, Arthritis, Blood transfusion without reported diagnosis (2012), Cataract, CKD (chronic kidney disease), stage III ([place]), CVA (cerebral infarction), Diabetes mellitus without complication ([place]), Family history of anesthesia complication, Gout, Groin pain (06/21/2019), Hamstring tendinitis (08/22/2019), Heart murmur, Herpes infection (08-09-14), History of gout (01/04/2019), Hyperlipidemia, Hypertension, Nocturia, Osteoarthritis of both sacroiliac joints ([place]) (08/22/2019), Osteomyelitis of right tibia ([place]) (07/23/2020), Other acute osteomyelitis, right femur ([place]) (07/23/2020), Pseudomonas aeruginosa infection (12/15/2018), and Stroke ([place]) (2006).   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   Her family history includes Cancer in her mother; Diabetes in her son and son; Heart disease in her brother; Hypertension in her father; Kidney disease in her daughter; Ovarian cancer in her mother; Peripheral vascular disease in her father.She reports that she has never smoked. She has never used smokeless tobacco. She reports that she does not drink alcohol and does not use drugs.  Current Outpatient Medications on File Prior to Visit  Medication Sig Dispense Refill  . acetaminophen (TYLENOL) 500 MG tablet Take 2 tablets (1,000 mg total) by mouth every 6 (six) hours as needed for mild pain. 90 tablet 1  . Alcohol Swabs (B-D SINGLE USE SWABS REGULAR) PADS Use to check glucose up to four times daily Dx E11.9 400 each 3  . atorvastatin (LIPITOR) 20 MG tablet TAKE 1 TABLET DAILY IN THE EVENING 90 tablet 1  . Blood Glucose Calibration (TRUE METRIX LEVEL 1) Low SOLN Use with glucose monitor Dx E11.9 3 each 0  . Blood Glucose Monitoring Suppl (TRUE METRIX AIR GLUCOSE METER) w/Device KIT 1 Device by Does not apply route 4 (four) times daily. Dx E11.9 1 kit 0  . fexofenadine (ALLEGRA) 180 MG tablet Take 1 tablet (180 mg total) by mouth daily. For allergy symptoms 30 tablet 11  . glucose blood (TRUE METRIX BLOOD GLUCOSE TEST) test strip Use to check glucose up to four times daily 400 each 3  . traMADol (ULTRAM) 50 MG tablet Take 1-2 tablets (50-100 mg total) by mouth every 6 (six) hours as needed for moderate pain. 40 tablet 0  . TRUEplus Lancets 33G MISC Test QID Dx E11.9 400 each 3  . ferrous sulfate 325 (65 FE) MG tablet Take 325 mg by mouth daily.  (Patient not taking: No sig reported)    . HYDROcodone-acetaminophen (NORCO/VICODIN) 5-325 MG tablet Take 1-2 tablets  by mouth every 6 (six) hours as needed for severe pain. (Patient not taking: Reported on 01/08/2021) 42 tablet 0  . methocarbamol  (ROBAXIN) 500 MG tablet Take 1 tablet (500 mg total) by mouth every 6 (six) hours as needed for muscle spasms. (Patient not taking: Reported on 01/08/2021) 40 tablet 0   Current Facility-Administered Medications on File Prior to Visit  Medication Dose Route Frequency Provider Last Rate Last Admin  . tranexamic acid (CYKLOKAPRON) 2,000 mg in sodium chloride 0.9 % 50 mL Topical Application  3,329 mg Topical Once [name], MD        ROS Review of Systems  Constitutional: Negative.   HENT: Negative.   Eyes: Negative for visual disturbance.  Respiratory: Negative for shortness of breath.   Cardiovascular: Negative for chest pain.  Gastrointestinal: Negative for abdominal pain.  Musculoskeletal: Positive for arthralgias and gait problem.    Objective:  BP 131/71   Pulse 65   Temp (!) 97.3 F (36.3 C) (Temporal)   BP Readings from Last 3 Encounters:  01/08/21 131/71  08/29/20 (!) 169/82  08/21/20 (!) 158/82    Wt Readings from Last 3 Encounters:  08/28/20 213 lb 13.5 oz (97 kg)  08/21/20 214 lb (97.1 kg)  04/02/20 215 lb (97.5 kg)     Physical Exam Constitutional:      General: She is not in acute distress.    Appearance: She is well-developed and well-nourished.  Cardiovascular:     Rate and Rhythm: Normal rate and regular rhythm.  Pulmonary:     Breath sounds: Normal breath sounds.  Musculoskeletal:        General: Deformity (At the right knee.  Site of previous knee replacement and current rod placement.) present.  Skin:    General: Skin is warm and dry.  Neurological:     Mental Status: She is alert and oriented to person, place, and time.  Psychiatric:        Mood and Affect: Mood and affect normal.      Results for orders placed or performed in visit on 01/08/21  Bayer DCA Hb A1c Waived  Result Value Ref Range   HB A1C (BAYER DCA - WAIVED) 6.0 <7.0 %    Assessment & Plan:   Nevaen was seen today for medical management of chronic issues.  Diagnoses  and all orders for this visit:  Controlled type 2 diabetes mellitus without complication, without long-term current use of insulin (HCC) -     CBC with Differential/Platelet -     CMP14+EGFR -     Bayer DCA Hb A1c Waived  Essential hypertension -     CBC with Differential/Platelet -     CMP14+EGFR  Mixed hyperlipidemia -     CBC with Differential/Platelet -     CMP14+EGFR -     Lipid panel  Iron deficiency anemia, unspecified iron deficiency anemia type -     CBC with Differential/Platelet -     CMP14+EGFR  Severe uncontrolled hypertension -     CBC with Differential/Platelet -     CMP14+EGFR  Other orders -     olmesartan (BENICAR) 40 MG tablet; TAKE ONE TABLET EACH DAY -     Nebivolol HCl 20 MG TABS; TAKE 1 TABLET EVERY DAY -     metFORMIN (GLUCOPHAGE-XR) 500 MG 24 hr tablet; Take 1 tablet (500 mg total) by mouth daily. -     isosorbide dinitrate (ISORDIL) 10 MG tablet; Take 1 tablet (10 mg total) by  mouth 3 (three) times daily. -     hydrALAZINE (APRESOLINE) 100 MG tablet; Take 1 tablet (100 mg total) by mouth 3 (three) times daily. -     amLODipine (NORVASC) 10 MG tablet; Take 1 tablet (10 mg total) by mouth every evening.      I have discontinued Maryann Alar. Musson's rivaroxaban. I have also changed her olmesartan, metFORMIN, isosorbide dinitrate, hydrALAZINE, and amLODipine. Additionally, I am having her maintain her acetaminophen, ferrous sulfate, B-D SINGLE USE SWABS REGULAR, True Metrix Air Glucose Meter, True Metrix Blood Glucose Test, True Metrix Level 1, TRUEplus Lancets 33G, fexofenadine, HYDROcodone-acetaminophen, traMADol, methocarbamol, atorvastatin, diltiazem, and Nebivolol HCl.  Meds ordered this encounter  Medications  . olmesartan (BENICAR) 40 MG tablet    Sig: TAKE ONE TABLET EACH DAY    Dispense:  90 tablet    Refill:  1  . Nebivolol HCl 20 MG TABS    Sig: TAKE 1 TABLET EVERY DAY    Dispense:  90 tablet    Refill:  1  . metFORMIN (GLUCOPHAGE-XR) 500 MG  24 hr tablet    Sig: Take 1 tablet (500 mg total) by mouth daily.    Dispense:  90 tablet    Refill:  1  . isosorbide dinitrate (ISORDIL) 10 MG tablet    Sig: Take 1 tablet (10 mg total) by mouth 3 (three) times daily.    Dispense:  270 tablet    Refill:  1  . hydrALAZINE (APRESOLINE) 100 MG tablet    Sig: Take 1 tablet (100 mg total) by mouth 3 (three) times daily.    Dispense:  270 tablet    Refill:  1  . amLODipine (NORVASC) 10 MG tablet    Sig: Take 1 tablet (10 mg total) by mouth every evening.    Dispense:  90 tablet    Refill:  1     Follow-up: Return in about 3 months (around 04/07/2021), or if symptoms worsen or fail to improve.  Claretta Fraise, M.D.

## 2021-01-09 LAB — LIPID PANEL
Chol/HDL Ratio: 2.5 ratio (ref 0.0–4.4)
Cholesterol, Total: 148 mg/dL (ref 100–199)
HDL: 59 mg/dL (ref >39)
LDL Chol Calc (NIH): 72 mg/dL (ref 0–99)
Triglycerides: 93 mg/dL (ref 0–149)
VLDL Cholesterol Cal: 17 mg/dL (ref 5–40)

## 2021-01-09 LAB — CMP14+EGFR
ALT: 8 IU/L (ref 0–32)
AST: 13 IU/L (ref 0–40)
Albumin/Globulin Ratio: 1.3 (ref 1.2–2.2)
Albumin: 4.2 g/dL (ref 3.7–4.7)
Alkaline Phosphatase: 157 IU/L — ABNORMAL HIGH (ref 44–121)
BUN/Creatinine Ratio: 18 (ref 12–28)
BUN: 23 mg/dL (ref 8–27)
Bilirubin Total: 0.5 mg/dL (ref 0.0–1.2)
CO2: 21 mmol/L (ref 20–29)
Calcium: 9.6 mg/dL (ref 8.7–10.3)
Chloride: 104 mmol/L (ref 96–106)
Creatinine, Ser: 1.29 mg/dL — ABNORMAL HIGH (ref 0.57–1.00)
GFR calc Af Amer: 46 mL/min/{1.73_m2} — ABNORMAL LOW (ref >59)
GFR calc non Af Amer: 40 mL/min/{1.73_m2} — ABNORMAL LOW (ref >59)
Globulin, Total: 3.2 g/dL (ref 1.5–4.5)
Glucose: 118 mg/dL — ABNORMAL HIGH (ref 65–99)
Potassium: 4.3 mmol/L (ref 3.5–5.2)
Sodium: 139 mmol/L (ref 134–144)
Total Protein: 7.4 g/dL (ref 6.0–8.5)

## 2021-01-09 LAB — CBC WITH DIFFERENTIAL/PLATELET
Basophils Absolute: 0 10*3/uL (ref 0.0–0.2)
Basos: 1 %
EOS (ABSOLUTE): 0.4 10*3/uL (ref 0.0–0.4)
Eos: 5 %
Hematocrit: 34.3 % (ref 34.0–46.6)
Hemoglobin: 10.9 g/dL — ABNORMAL LOW (ref 11.1–15.9)
Immature Grans (Abs): 0 10*3/uL (ref 0.0–0.1)
Immature Granulocytes: 1 %
Lymphocytes Absolute: 1.5 10*3/uL (ref 0.7–3.1)
Lymphs: 21 %
MCH: 24.6 pg — ABNORMAL LOW (ref 26.6–33.0)
MCHC: 31.8 g/dL (ref 31.5–35.7)
MCV: 77 fL — ABNORMAL LOW (ref 79–97)
Monocytes Absolute: 0.6 10*3/uL (ref 0.1–0.9)
Monocytes: 8 %
Neutrophils Absolute: 4.6 10*3/uL (ref 1.4–7.0)
Neutrophils: 64 %
Platelets: 365 10*3/uL (ref 150–450)
RBC: 4.43 x10E6/uL (ref 3.77–5.28)
RDW: 15.5 % — ABNORMAL HIGH (ref 11.7–15.4)
WBC: 7.1 10*3/uL (ref 3.4–10.8)

## 2021-01-14 ENCOUNTER — Other Ambulatory Visit: Payer: Self-pay | Admitting: Family Medicine

## 2021-01-28 DIAGNOSIS — L989 Disorder of the skin and subcutaneous tissue, unspecified: Secondary | ICD-10-CM | POA: Diagnosis not present

## 2021-01-28 DIAGNOSIS — M549 Dorsalgia, unspecified: Secondary | ICD-10-CM | POA: Diagnosis not present

## 2021-01-28 DIAGNOSIS — R32 Unspecified urinary incontinence: Secondary | ICD-10-CM | POA: Diagnosis not present

## 2021-01-28 DIAGNOSIS — M009 Pyogenic arthritis, unspecified: Secondary | ICD-10-CM | POA: Diagnosis not present

## 2021-01-28 DIAGNOSIS — M869 Osteomyelitis, unspecified: Secondary | ICD-10-CM | POA: Diagnosis not present

## 2021-01-28 DIAGNOSIS — Z4789 Encounter for other orthopedic aftercare: Secondary | ICD-10-CM | POA: Diagnosis not present

## 2021-02-01 ENCOUNTER — Ambulatory Visit: Payer: Medicare HMO | Admitting: Infectious Disease

## 2021-02-20 ENCOUNTER — Ambulatory Visit: Payer: Medicare HMO | Admitting: Infectious Disease

## 2021-02-20 ENCOUNTER — Other Ambulatory Visit: Payer: Self-pay

## 2021-02-20 VITALS — BP 190/91 | HR 72 | Temp 97.8°F

## 2021-02-20 DIAGNOSIS — A498 Other bacterial infections of unspecified site: Secondary | ICD-10-CM | POA: Diagnosis not present

## 2021-02-20 DIAGNOSIS — M009 Pyogenic arthritis, unspecified: Secondary | ICD-10-CM | POA: Diagnosis not present

## 2021-02-20 DIAGNOSIS — M76891 Other specified enthesopathies of right lower limb, excluding foot: Secondary | ICD-10-CM | POA: Diagnosis not present

## 2021-02-20 DIAGNOSIS — M86661 Other chronic osteomyelitis, right tibia and fibula: Secondary | ICD-10-CM

## 2021-02-20 NOTE — Progress Notes (Signed)
Subjective:  Chief complaint: knee pain change in color of her skin   l Beth [name] ID: Beth Beth [name], female    DOB: 1946/10/08, 75 y.o.   MRN: [id number]  HPI  Beth Beth [name] is a 75  y.o. 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  She apparently originally had right knee total arthroplasty in July 2015.  In Beth interim she had an I&D in October 2015 with poly-exchange followed by antibiotics.  She then had in 2016 a right knee resection arthroplasty with placement of antibiotic spacers followed by antibiotics and followed by reimplantation of new prosthetic knee.  Unfortunately she again had failure and of Beth attempts to eradicate infection in his knee and her prosthetic knee has been removed and antibiotic spacer placed  Is my understanding that no organism had been isolated in Beth past.   She subsequently grew a pansensitive pseudomonal species.  She was discharged on cefepime but then admitted with renal failure and changed to oral ciprofloxacin.  .  Knee pain qwas better now versus when she was in Beth hospital.  She was due to complete Beth ciprofloxacin on 17 December 2018    In Beth interim she was seen by me and we rechecked inflammatory markers which were still quite high sed rate still in Beth 70s and CRP had gone up into Beth 20s.  Therefore I had continued her on oral ciprofloxacin in [facility] nursing facility.  When she was discharged some skilled nurse facility on 20 January they unfortunately did not send her home with oral antibiotics.  She then resumed Beth oral l ciprofloxacin.  However in Beth interim she has developed some severe right-sided groin pain   We obtained an MRI of Beth hip in August 2020 which did not show osteomyelitis, or septic hep but did show severe osteoarthritic changes of Beth sacroiliac joints bilaterally and also question of some hamstring tendinosis.  Beth Beth [name] saw Beth Beth [name] in clinic reviewed films and had  some concern whether or not Beth tendinosis seen on imaging could be related to chronic fluoroquinolone usage.  Beth Beth Beth [name] pain in her knee had improved as mentioned inflammatory markers had trended down.  Beth [name] was taken off ciprofloxacin to see how she would do both in terms of her groin and hip pain and also her knee pain.  Since then her hip pain  improved dramatically suggesting Beth possibility that fluoroquinolones could have been in play in terms of causing a tendinopathy.   Her knee pain had remained stable off of antibiotics  Since August of 2020.  Several visits aggo was encouraged by how she was doing but when I checked her inflammatory markers they had become more elevated.  She had been seen by Beth Beth [name] and recounts him removing fluid from her knee and sending this for analysis and culture.  However when I went into "care everywhere portal Beth notes indicated that fluid cannot be withdrawn.  Beth Beth [name] also mentioned doing an MRI of Beth knee prior to considering reimplanting a new knee.    Did obtain MRI of Beth knee on December 28, 2019 and this was read as showing:   Antibiotic laden cement with some postsurgical changes in Beth distal femur and proximal tibia there was some irregular enhancement on Beth margins of Beth tracks that were felt by radiology possibly concerning for osteomyelitis versus residual postsurgical changes.  I  personally reviewed these films  I alerted Dr.  Alusio re this MRI and he also reviewed himself.  He felt Beth changes were more consistent with abrasive changes that can happen in Beth context of chronic antibiotic cement being in place he was less concerned by Beth radiographic appearance for possible osteomyelitis.  We both continued to be concerned about her elevated inflammatory markers.  She does have a history of gout though it has not flared all in recent times she is really not on any medicines for gout at present.  S 4 months after her  MRI.  Knee pain was stable largely occurs at night or if she shifts around and bears weight on it.  Inflammatory markers were persistently abnormal.  Obtained another MRI of Beth knee in late July 2021 without contrast I believe she could not tolerate Beth procedure very well and this showed: T2 hyperintensity within Beth marrow cavity of Beth distal femur and proximal tibia remains concerning for chronic osteomyelitis post removal of total knee arthroplasty. No progressive bone destruction was seen.  Continued have pain in her knee more medially and was tender when I palpated that aspect of her knee joint.  Dr Beth Beth [name] took her to Beth OR in August 28, 2020 and performed I and D , removal of antibiotic spacer, with  Debridement of Beth femoral canal as well as Beth tibia, placement of new antibiotic spacer with 2 steinman pins in them that effectively have left Beth [name] with leg with knee unbent giving it similar appearance to a fused knee  She was seen by my partner Dr. Johnnye [name] and she did not want to be on IV antibiotics> cultures from femur, joint and tibia were unrevealing . Dr. Johnnye [name] placed her on levaquin with plan to treat for protracted time period and for he to followup in [facility] in 2-3 weeks post dc.  Instead she never made it to [facility] until today. Additionally she stopped her levaquin--likely in October.  She was initially saying that Beth knee does not hurt but her daughter who was with her pointed out that she does complain about knee and that she also complains about Beth color.        Past Medical History:  Diagnosis Date  . Anemia   . Arthritis    Knee both knees  . Blood transfusion without reported diagnosis 2012   anemia;pt denies transfusion stated was only on iron tablet  . Cataract    left  . CKD (chronic kidney disease), stage III ([place])   . CVA (cerebral infarction)    2006  . Diabetes mellitus without complication ([place])   . Family history of anesthesia  complication    sister very slow to awaken after anesthesia;severe vomiting   . Gout    left elbow  . Groin pain 06/21/2019  . Hamstring tendinitis 08/22/2019  . Heart murmur   . 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 08-09-14 Right eye  . History of gout 01/04/2019  . Hyperlipidemia   . Hypertension   . Nocturia    3-4 times per night  . Osteoarthritis of both sacroiliac joints ([name]) 08/22/2019  . Osteomyelitis of right tibia ([place]) 07/23/2020  . Other acute osteomyelitis, right femur ([place]) 07/23/2020  . 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 Stroke [facility]) 2006   x 1 no deficits noted     Past Surgical History:  Procedure Laterality Date  . ABDOMINAL HYSTERECTOMY  1983  . CARPAL TUNNEL RELEASE Right 1983  . colonscopy  June 21, 2014  . EXCISIONAL TOTAL  KNEE ARTHROPLASTY WITH ANTIBIOTIC SPACERS Right 02/16/2015   Procedure: RIGHT KNEE RESECTION ARTHROPLASTY WITH ANTIBIOTIC SPACERS;  Surgeon: [name], MD;  Location: [facility];  Service: Orthopedics;  Laterality: Right;  . EXCISIONAL TOTAL KNEE ARTHROPLASTY WITH ANTIBIOTIC SPACERS Right 11/03/2018   Procedure: Right knee resection arthroplasty; antibiotic spacer;  Surgeon: [name], MD;  Location: [facility];  Service: Orthopedics;  Laterality: Right;  Adductor Block  . I & D KNEE WITH POLY EXCHANGE Right 10/02/2014   Procedure: IRRIGATION AND DEBRIDEMENT RIGHT KNEE WITH POLY EXCHANGE;  Surgeon: Gearlean Alf, MD;  Location: [facility];  Service: Orthopedics;  Laterality: Right;  . I & D KNEE WITH POLY EXCHANGE Right 08/27/2020   Procedure: IRRIGATION AND DEBRIDEMENT; SPACER EXCHANGE RIGHT KNEE with multiple specimens;  Surgeon: [name], MD;  Location: [facility];  Service: Orthopedics;  Laterality: Right;  73min  . INCISION AND DRAINAGE OF WOUND Right 01/14/2017   Procedure: IRRIGATION AND DEBRIDEMENT WOUND;  Surgeon: [name], MD;  Location: [facility];  Service: Orthopedics;  Laterality: Right;  requests 14mins  . JOINT  REPLACEMENT  06/2014   right knee  . nasal cauterization  2012  . PATELLAR TENDON REPAIR Right 08/11/2014   Procedure: RIGHT PATELLA TENDON REPAIR;  Surgeon: Gearlean Alf, MD;  Location: [facility];  Service: Orthopedics;  Laterality: Right;  . REIMPLANTATION OF TOTAL KNEE Right 05/23/2015   Procedure: RIGHT KNEE ARTHROPLASTY REIMPLANTATION;  Surgeon: [name], MD;  Location: [facility];  Service: Orthopedics;  Laterality: Right;  . TOTAL KNEE ARTHROPLASTY Right 07/03/2014   Procedure: RIGHT TOTAL KNEE ARTHROPLASTY;  Surgeon: Gearlean Alf, MD;  Location: [facility];  Service: Orthopedics;  Laterality: Right;  . TUBAL LIGATION      Family History  Problem Relation Age of Onset  . Ovarian cancer Mother   . Cancer Mother   . Diabetes Son   . Diabetes Son   . Peripheral vascular disease Father        with amputation of both legs  . Hypertension Father   . Heart disease Brother   . Kidney disease Daughter   . Colon cancer Neg Hx   . Esophageal cancer Neg Hx   . Stomach cancer Neg Hx   . Rectal cancer Neg Hx       Social History   Socioeconomic History  . Marital status: Widowed    Spouse name: Not on file  . Number of children: 9  . Years of education: 8th  . Highest education level: 8th grade  Occupational History    Employer: RETIRED  Tobacco Use  . Smoking status: Never Smoker  . Smokeless tobacco: Never Used  Vaping Use  . Vaping Use: Never used  Substance and Sexual Activity  . Alcohol use: No  . Drug use: No  . Sexual activity: Not Currently  Other Topics Concern  . Not on file  Social History Narrative   She has 6 living adult children and 3 who have passed away and many grandchildren and great grandchildren. Beth [name] is retired. 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    Social Determinants of Health   Financial Resource Strain: Low Risk   . Difficulty of Paying Living Expenses: Not hard at all  Food Insecurity: No Food Insecurity  . Worried  About Charity fundraiser in Beth Last Year: Never true  . Ran Out of Food in Beth Last Year: Never true  Transportation Needs: No Transportation Needs  .  Lack of Transportation (Medical): No  . Lack of Transportation (Non-Medical): No  Physical Activity: Insufficiently Active  . Days of Exercise per Week: 7 days  . Minutes of Exercise per Session: 20 min  Stress: Not on file  Social Connections: Moderately Isolated  . Frequency of Communication with Friends and Family: More than three times a week  . Frequency of Social Gatherings with Friends and Family: More than three times a week  . Attends Religious Services: More than 4 times per year  . Active Member of Clubs or Organizations: No  . Attends Archivist Meetings: Never  . Marital Status: Divorced    Allergies  Allergen Reactions  . Aleve [Naproxen Sodium] Other (See Comments)    Heart races  . Asa [Aspirin] Other (See Comments)    Nose bleeding  . Ciprofloxacin Other (See Comments)    Possible hamstring tendinopathy  . Clonidine Derivatives Other (See Comments)    Dizziness and weakness  . Shellfish Allergy Nausea And Vomiting     Current Outpatient Medications:  .  acetaminophen (TYLENOL) 500 MG tablet, Take 2 tablets (1,000 mg total) by mouth every 6 (six) hours as needed for mild pain., Disp: 90 tablet, Rfl: 1 .  amLODipine (NORVASC) 10 MG tablet, Take 1 tablet (10 mg total) by mouth every evening., Disp: 90 tablet, Rfl: 1 .  atorvastatin (LIPITOR) 20 MG tablet, TAKE 1 TABLET DAILY IN Beth EVENING, Disp: 90 tablet, Rfl: 1 .  Blood Glucose Calibration (TRUE METRIX LEVEL 1) Low SOLN, Use with glucose monitor Dx E11.9, Disp: 3 each, Rfl: 0 .  Blood Glucose Monitoring Suppl (TRUE METRIX AIR GLUCOSE METER) w/Device KIT, 1 Device by Does not apply route 4 (four) times daily. Dx E11.9, Disp: 1 kit, Rfl: 0 .  diltiazem (CARDIZEM CD) 240 MG 24 hr capsule, TAKE 1 CAPSULE EVERY DAY, Disp: 90 capsule, Rfl: 0 .  ferrous  sulfate 325 (65 FE) MG tablet, Take 325 mg by mouth daily., Disp: , Rfl:  .  glucose blood (TRUE METRIX BLOOD GLUCOSE TEST) test strip, check glucose up to four times daily Dx E11.9, Disp: 400 strip, Rfl: 3 .  hydrALAZINE (APRESOLINE) 100 MG tablet, Take 1 tablet (100 mg total) by mouth 3 (three) times daily., Disp: 270 tablet, Rfl: 1 .  HYDROcodone-acetaminophen (NORCO/VICODIN) 5-325 MG tablet, Take 1-2 tablets by mouth every 6 (six) hours as needed for severe pain., Disp: 42 tablet, Rfl: 0 .  isosorbide dinitrate (ISORDIL) 10 MG tablet, Take 1 tablet (10 mg total) by mouth 3 (three) times daily., Disp: 270 tablet, Rfl: 1 .  metFORMIN (GLUCOPHAGE-XR) 500 MG 24 hr tablet, Take 1 tablet (500 mg total) by mouth daily., Disp: 90 tablet, Rfl: 1 .  methocarbamol (ROBAXIN) 500 MG tablet, Take 1 tablet (500 mg total) by mouth every 6 (six) hours as needed for muscle spasms., Disp: 40 tablet, Rfl: 0 .  Nebivolol HCl 20 MG TABS, TAKE 1 TABLET EVERY DAY, Disp: 90 tablet, Rfl: 1 .  olmesartan (BENICAR) 40 MG tablet, TAKE ONE TABLET EACH DAY, Disp: 90 tablet, Rfl: 1 .  TRUEplus Lancets 33G MISC, Test QID Dx E11.9, Disp: 400 each, Rfl: 3 .  Alcohol Swabs (B-D SINGLE USE SWABS REGULAR) PADS, Use to check glucose up to four times daily Dx E11.9 (Beth [name] not taking: Reported on 02/20/2021), Disp: 400 each, Rfl: 3 .  fexofenadine (ALLEGRA) 180 MG tablet, Take 1 tablet (180 mg total) by mouth daily. For allergy symptoms (Beth [name] not taking: Reported  on 02/20/2021), Disp: 30 tablet, Rfl: 11 .  traMADol (ULTRAM) 50 MG tablet, Take 1-2 tablets (50-100 mg total) by mouth every 6 (six) hours as needed for moderate pain. (Beth [name] not taking: Reported on 02/20/2021), Disp: 40 tablet, Rfl: 0 No current facility-administered medications for this visit.  Facility-Administered Medications Ordered in Other Visits:  .  tranexamic acid (CYKLOKAPRON) 2,000 mg in sodium chloride 0.9 % 50 mL Topical Application, 6,811 mg, Topical, Once,  Aluisio, Frank, MD   As per HPI otherwise 12 point ROS is negative    Objective:   Physical Exam Constitutional:      General: She is not in acute distress.    Appearance: She is not diaphoretic.  HENT:     Head: Normocephalic and atraumatic.     Right Ear: External ear normal.     Left Ear: External ear normal.     Nose: Nose normal.  Eyes:     General: No scleral icterus.    Extraocular Movements: Extraocular movements intact.     Conjunctiva/sclera: Conjunctivae normal.  Cardiovascular:     Rate and Rhythm: Normal rate and regular rhythm.  Pulmonary:     Effort: Pulmonary effort is normal. No respiratory distress.  Abdominal:     General: There is no distension.    Lymphadenopathy:     Cervical: No cervical adenopathy.  Skin:    General: Skin is warm and dry.     Coloration: Skin is not pale.     Findings: No erythema or rash.  Neurological:     General: No focal deficit present.     Mental Status: She is alert and oriented to person, place, and time.     Coordination: Coordination normal.  Psychiatric:        Attention and Perception: Attention normal.        Mood and Affect: Mood normal. Mood is not anxious.        Behavior: Behavior normal.        Thought Content: Thought content normal.        Judgment: Judgment normal.   Right knee 07/23/2020:     Knee today 02/20/2021:       Assessment & Plan:    Right prosthetic joint infection status post multiple surgeries now status post explantation of prosthesis implantation of antibiotics beads and spacer  Who has had evidence of osteeomyelitis on imaging and underwent I and D with debridement of femoral canal and tibiaand new antibiotic spacer but this time with steinman apacers to serve functionally as something similar to an IM rod  I am worried about her having been off antibiotics for so long which would have been a gross undertreatment for a PJI that has recurred such as hers along with possible  osteomyelitis  I will check ESR, CRP, BMP, CBC w diff  I will also reach out to Beth Beth [name]   If labs are not reassuring I will start levaquin vs cipro  I spent greater than 40 minutes with Beth Beth [name] including greater than 50% of time in face to face counsel of Beth Beth [name] and her daugter, in review of Beth records and in coordination of her care.

## 2021-02-21 LAB — BASIC METABOLIC PANEL WITH GFR
BUN/Creatinine Ratio: 22 (calc) (ref 6–22)
BUN: 25 mg/dL (ref 7–25)
CO2: 24 mmol/L (ref 20–32)
Calcium: 9.9 mg/dL (ref 8.6–10.4)
Chloride: 104 mmol/L (ref 98–110)
Creat: 1.12 mg/dL — ABNORMAL HIGH (ref 0.60–0.93)
GFR, Est African American: 55 mL/min/{1.73_m2} — ABNORMAL LOW (ref >=60)
GFR, Est Non African American: 47 mL/min/{1.73_m2} — ABNORMAL LOW (ref >=60)
Glucose, Bld: 136 mg/dL — ABNORMAL HIGH (ref 65–99)
Potassium: 4.4 mmol/L (ref 3.5–5.3)
Sodium: 139 mmol/L (ref 135–146)

## 2021-02-21 LAB — CBC WITH DIFFERENTIAL/PLATELET
Absolute Monocytes: 644 cells/uL (ref 200–950)
Basophils Absolute: 22 cells/uL (ref 0–200)
Basophils Relative: 0.3 %
Eosinophils Absolute: 237 cells/uL (ref 15–500)
Eosinophils Relative: 3.2 %
HCT: 36.8 % (ref 35.0–45.0)
Hemoglobin: 11.2 g/dL — ABNORMAL LOW (ref 11.7–15.5)
Lymphs Abs: 1450 cells/uL (ref 850–3900)
MCH: 24.3 pg — ABNORMAL LOW (ref 27.0–33.0)
MCHC: 30.4 g/dL — ABNORMAL LOW (ref 32.0–36.0)
MCV: 79.8 fL — ABNORMAL LOW (ref 80.0–100.0)
MPV: 9.5 fL (ref 7.5–12.5)
Monocytes Relative: 8.7 %
Neutro Abs: 5047 cells/uL (ref 1500–7800)
Neutrophils Relative %: 68.2 %
Platelets: 349 10*3/uL (ref 140–400)
RBC: 4.61 10*6/uL (ref 3.80–5.10)
RDW: 15.5 % — ABNORMAL HIGH (ref 11.0–15.0)
Total Lymphocyte: 19.6 %
WBC: 7.4 10*3/uL (ref 3.8–10.8)

## 2021-02-21 LAB — C-REACTIVE PROTEIN: CRP: 7 mg/L (ref <8.0)

## 2021-02-21 LAB — SEDIMENTATION RATE: Sed Rate: 53 mm/h — ABNORMAL HIGH (ref 0–30)

## 2021-02-25 DIAGNOSIS — Z4789 Encounter for other orthopedic aftercare: Secondary | ICD-10-CM | POA: Diagnosis not present

## 2021-02-25 DIAGNOSIS — M549 Dorsalgia, unspecified: Secondary | ICD-10-CM | POA: Diagnosis not present

## 2021-02-25 DIAGNOSIS — L989 Disorder of the skin and subcutaneous tissue, unspecified: Secondary | ICD-10-CM | POA: Diagnosis not present

## 2021-02-25 DIAGNOSIS — M009 Pyogenic arthritis, unspecified: Secondary | ICD-10-CM | POA: Diagnosis not present

## 2021-02-25 DIAGNOSIS — M869 Osteomyelitis, unspecified: Secondary | ICD-10-CM | POA: Diagnosis not present

## 2021-02-25 DIAGNOSIS — R32 Unspecified urinary incontinence: Secondary | ICD-10-CM | POA: Diagnosis not present

## 2021-03-15 ENCOUNTER — Other Ambulatory Visit: Payer: Self-pay | Admitting: Family Medicine

## 2021-03-28 DIAGNOSIS — E1142 Type 2 diabetes mellitus with diabetic polyneuropathy: Secondary | ICD-10-CM | POA: Diagnosis not present

## 2021-03-28 DIAGNOSIS — R32 Unspecified urinary incontinence: Secondary | ICD-10-CM | POA: Diagnosis not present

## 2021-03-28 DIAGNOSIS — M549 Dorsalgia, unspecified: Secondary | ICD-10-CM | POA: Diagnosis not present

## 2021-03-28 DIAGNOSIS — M79676 Pain in unspecified toe(s): Secondary | ICD-10-CM | POA: Diagnosis not present

## 2021-03-28 DIAGNOSIS — L989 Disorder of the skin and subcutaneous tissue, unspecified: Secondary | ICD-10-CM | POA: Diagnosis not present

## 2021-03-28 DIAGNOSIS — M009 Pyogenic arthritis, unspecified: Secondary | ICD-10-CM | POA: Diagnosis not present

## 2021-03-28 DIAGNOSIS — M869 Osteomyelitis, unspecified: Secondary | ICD-10-CM | POA: Diagnosis not present

## 2021-03-28 DIAGNOSIS — B351 Tinea unguium: Secondary | ICD-10-CM | POA: Diagnosis not present

## 2021-03-28 DIAGNOSIS — L84 Corns and callosities: Secondary | ICD-10-CM | POA: Diagnosis not present

## 2021-03-28 DIAGNOSIS — Z4789 Encounter for other orthopedic aftercare: Secondary | ICD-10-CM | POA: Diagnosis not present

## 2021-04-05 ENCOUNTER — Ambulatory Visit: Payer: Medicare HMO | Admitting: Infectious Disease

## 2021-04-05 ENCOUNTER — Other Ambulatory Visit: Payer: Self-pay

## 2021-04-05 VITALS — BP 190/80 | HR 61 | Resp 16 | Ht 66.0 in | Wt 213.0 lb

## 2021-04-05 DIAGNOSIS — M009 Pyogenic arthritis, unspecified: Secondary | ICD-10-CM

## 2021-04-05 DIAGNOSIS — A498 Other bacterial infections of unspecified site: Secondary | ICD-10-CM

## 2021-04-05 DIAGNOSIS — M86661 Other chronic osteomyelitis, right tibia and fibula: Secondary | ICD-10-CM

## 2021-04-05 DIAGNOSIS — T8453XD Infection and inflammatory reaction due to internal right knee prosthesis, subsequent encounter: Secondary | ICD-10-CM

## 2021-04-05 DIAGNOSIS — I10 Essential (primary) hypertension: Secondary | ICD-10-CM

## 2021-04-05 NOTE — Progress Notes (Signed)
Subjective:  Chief complaint: Is complaining of pain in her lower leg region of tibia   l Patient ID: Beth [name], female    DOB: [birth year], 75 y.o.   MRN: [id number]  HPI  Beth [name] is a 75  y.o. 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  She apparently originally had right knee total arthroplasty in July 2015.  In the interim she had an I&D in October 2015 with poly-exchange followed by antibiotics.  She then had in 2016 a right knee resection arthroplasty with placement of antibiotic spacers followed by antibiotics and followed by reimplantation of new prosthetic knee.  Unfortunately she again had failure and of the attempts to eradicate infection in his knee and her prosthetic knee has been removed and antibiotic spacer placed  Is my understanding that no organism had been isolated in the past.   She subsequently grew a pansensitive pseudomonal species.  She was discharged on cefepime but then admitted with renal failure and changed to oral ciprofloxacin.  She was due to complete the ciprofloxacin on 17 December 2018    In the interim she was seen by me and we rechecked inflammatory markers which were still quite high sed rate still in the 70s and CRP had gone up into the 20s.  Therefore I had continued her on oral ciprofloxacin in the nursing facility.  When she was discharged some skilled nurse facility on 20 January they unfortunately did not send her home with oral antibiotics.  She then resumed the oral l ciprofloxacin.  However in the interim she has developed some severe right-sided groin pain   We obtained an MRI of the hip in August 2020 which did not show osteomyelitis, or septic hep but did show severe osteoarthritic changes of the sacroiliac joints bilaterally and also question of some hamstring tendinosis.  Beth [name] saw the patient in clinic reviewed films and had some concern whether or not the tendinosis seen on  imaging could be related to chronic fluoroquinolone usage.  The patient's pain in her knee had improved as mentioned inflammatory markers had trended down.  Patient was taken off ciprofloxacin to see how she would do both in terms of her groin and hip pain and also her knee pain.  Since then her hip pain  improved dramatically suggesting the possibility that fluoroquinolones could have been in play in terms of causing a tendinopathy.  Her knee pain had remained stable off of antibiotics  Since August of 2020.  Several visits aggo was encouraged by how she was doing but when I checked her inflammatory markers they had become more elevated.  She had been seen by Dr. Maureen Ralphs and recounts him removing fluid from her knee and sending this for analysis and culture.  However when I went into "[facility] everywhere portal the notes indicated that fluid cannot be withdrawn.  Dr. Maureen Ralphs also mentioned doing an MRI of the knee prior to considering reimplanting a new knee.    Did obtain MRI of the knee on December 28, 2019 and this was read as showing:   Antibiotic laden cement with some postsurgical changes in the distal femur and proximal tibia there was some irregular enhancement on the margins of the tracks that were felt by radiology possibly concerning for osteomyelitis versus residual postsurgical changes.  I  personally reviewed these films  I alerted Dr. Maureen Ralphs re this MRI and he also reviewed himself.  He felt the  changes were more consistent with abrasive changes that can happen in the context of chronic antibiotic cement being in place he was less concerned by the radiographic appearance for possible osteomyelitis.  We both continued to be concerned about her elevated inflammatory markers.  She does have a history of gout though it has not flared all in recent times she is really not on any medicines for gout at present.  S 4 months after her MRI.  Knee pain was stable largely occurs at night or  if she shifts around and bears weight on it.  Inflammatory markers were persistently abnormal.  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 No progressive bone destruction was seen.  Continued have pain in her knee more medially and was tender when I palpated that aspect of her knee joint.  Dr Maureen Ralphs took her to the OR in August 28, 2020 and performed I and D , removal of antibiotic spacer, with  Debridement of the femoral canal as well as the tibia, placement of new antibiotic spacer with 2 steinman pins in them that effectively have left patient with leg with knee unbent giving it similar appearance to a fused knee  She was seen by my partner Dr. Johnnye Sima and she did not want to be on IV antibiotics> cultures from femur, joint and tibia were unrevealing . Dr. Johnnye Sima placed her on levaquin with plan to treat for protracted time period and for he to followup in [facility] in 2-3 weeks post dc.  Instead she never made it to [facility] until today. Additionally she stopped her levaquin--likely in October.  Her last visit she was initially saying that the knee does not hurt but her daughter who was with her pointed out that she does complain about knee and that she also complains about the color.  Her inflammatory markers were still elevated.  I did talk to Dr. Maureen Ralphs after that visit and again today after her visit.  He was confident the degree of debridement he achieved that infected bone was removed surgically though she di dot have the desired antibiotic course that we had recommended at that time in the fall.  She is now today complaining more pain now in her area of the tibia which is tender to palpation.  We will recheck labs today  but have strongly recommend that she also make an appoint with Dr. Maureen Ralphs who also himself told me his office would reach out to the patient.        Past Medical History:  Diagnosis Date  . Anemia   . Arthritis    Knee both knees  . Blood transfusion without reported diagnosis 2012   anemia;pt denies transfusion stated was only on iron tablet  . Cataract    left  . CKD (chronic kidney disease), stage III ([place])   . CVA (cerebral infarction)    2006  . Diabetes mellitus without complication ([place])   . Family history of anesthesia complication    sister very slow to awaken after anesthesia;severe vomiting   . Gout    left elbow  . Groin pain 06/21/2019  . Hamstring tendinitis 08/22/2019  . Heart murmur   . 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 08-09-14 Right eye  . History  of gout 01/04/2019  . Hyperlipidemia   . Hypertension   . Nocturia    3-4 times per night  . Osteoarthritis of both sacroiliac joints ([place]) 08/22/2019  . Osteomyelitis of right tibia ([place]) 07/23/2020  . Other acute osteomyelitis, right femur ([place]) 07/23/2020  . 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 Stroke [facility]) 2006   x 1 no deficits noted     Past Surgical History:  Procedure Laterality Date  . ABDOMINAL HYSTERECTOMY  1983  . CARPAL TUNNEL RELEASE Right 1983  . colonscopy  June 21, 2014  . EXCISIONAL TOTAL KNEE ARTHROPLASTY WITH ANTIBIOTIC SPACERS Right 02/16/2015   Procedure: RIGHT KNEE RESECTION ARTHROPLASTY WITH ANTIBIOTIC SPACERS;  Surgeon: [name], MD;  Location: [facility];  Service: Orthopedics;  Laterality: Right;  . EXCISIONAL TOTAL KNEE ARTHROPLASTY WITH ANTIBIOTIC SPACERS Right 11/03/2018   Procedure: Right knee resection arthroplasty; antibiotic spacer;  Surgeon: [name], MD;  Location: [facility];  Service: Orthopedics;  Laterality: Right;  Adductor Block  . I & D KNEE WITH POLY EXCHANGE Right 10/02/2014   Procedure: IRRIGATION AND DEBRIDEMENT RIGHT KNEE WITH POLY EXCHANGE;   Surgeon: Gearlean Alf, MD;  Location: [facility];  Service: Orthopedics;  Laterality: Right;  . I & D KNEE WITH POLY EXCHANGE Right 08/27/2020   Procedure: IRRIGATION AND DEBRIDEMENT; SPACER EXCHANGE RIGHT KNEE with multiple specimens;  Surgeon: [name], MD;  Location: [facility];  Service: Orthopedics;  Laterality: Right;  25mn  . INCISION AND DRAINAGE OF WOUND Right 01/14/2017   Procedure: IRRIGATION AND DEBRIDEMENT WOUND;  Surgeon: FGaynelle Arabian MD;  Location: [facility];  Service: Orthopedics;  Laterality: Right;  requests 468ms  . JOINT REPLACEMENT  06/2014   right knee  . nasal cauterization  2012  . PATELLAR TENDON REPAIR Right 08/11/2014   Procedure: RIGHT PATELLA TENDON REPAIR;  Surgeon: FrGearlean AlfMD;  Location: [facility];  Service: Orthopedics;  Laterality: Right;  . REIMPLANTATION OF TOTAL KNEE Right 05/23/2015   Procedure: RIGHT KNEE ARTHROPLASTY REIMPLANTATION;  Surgeon: FrGaynelle ArabianMD;  Location: [facility];  Service: Orthopedics;  Laterality: Right;  . TOTAL KNEE ARTHROPLASTY Right 07/03/2014   Procedure: RIGHT TOTAL KNEE ARTHROPLASTY;  Surgeon: FrGearlean AlfMD;  Location: [facility];  Service: Orthopedics;  Laterality: Right;  . TUBAL LIGATION      Family History  Problem Relation Age of Onset  . Ovarian cancer Mother   . Cancer Mother   . Diabetes Son   . Diabetes Son   . Peripheral vascular disease Father        with amputation of both legs  . Hypertension Father   . Heart disease Brother   . Kidney disease Daughter   . Colon cancer Neg Hx   . Esophageal cancer Neg Hx   . Stomach cancer Neg Hx   . Rectal cancer Neg Hx       Social History   Socioeconomic History  . Marital status: Widowed    Spouse name: Not on file  . Number of children: 9  . Years of education: 8th  . Highest education level: 8th grade  Occupational History    Employer: RETIRED  Tobacco Use  . Smoking status: Never Smoker  . Smokeless tobacco: Never Used  Vaping Use  . Vaping Use:  Never used  Substance and Sexual Activity  . Alcohol use: No  . Drug use: No  . Sexual activity: Not Currently  Other Topics Concern  . Not on file  Social History  Narrative   She has 6 living adult children and 3 who have passed away and many grandchildren and great grandchildren. Patient is retired. 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    Social Determinants of Health   Financial Resource Strain: Low Risk   . Difficulty of Paying Living Expenses: Not hard at all  Food Insecurity: No Food Insecurity  . Worried About Charity fundraiser in the Last Year: Never true  . Ran Out of Food in the Last Year: Never true  Transportation Needs: No Transportation Needs  . Lack of Transportation (Medical): No  . Lack of Transportation (Non-Medical): No  Physical Activity: Insufficiently Active  . Days of Exercise per Week: 7 days  . Minutes of Exercise per Session: 20 min  Stress: Not on file  Social Connections: Moderately Isolated  . Frequency of Communication with Friends and Family: More than three times a week  . Frequency of Social Gatherings with Friends and Family: More than three times a week  . Attends Religious Services: More than 4 times per year  . Active Member of Clubs or Organizations: No  . Attends Archivist Meetings: Never  . Marital Status: Divorced    Allergies  Allergen Reactions  . Aleve [Naproxen Sodium] Other (See Comments)    Heart races  . Asa [Aspirin] Other (See Comments)    Nose bleeding  . Ciprofloxacin Other (See Comments)    Possible hamstring tendinopathy  . Clonidine Derivatives Other (See Comments)    Dizziness and weakness  . Shellfish Allergy Nausea And Vomiting     Current Outpatient Medications:  .  acetaminophen (TYLENOL) 500 MG tablet, Take 2 tablets (1,000 mg total) by mouth every 6 (six) hours as needed for mild pain., Disp: 90 tablet, Rfl: 1 .  amLODipine (NORVASC) 10 MG tablet, Take 1 tablet (10 mg  total) by mouth every evening., Disp: 90 tablet, Rfl: 1 .  atorvastatin (LIPITOR) 20 MG tablet, TAKE 1 TABLET DAILY IN THE EVENING, Disp: 90 tablet, Rfl: 1 .  Blood Glucose Calibration (TRUE METRIX LEVEL 1) Low SOLN, Use with glucose monitor Dx E11.9, Disp: 3 each, Rfl: 0 .  Blood Glucose Monitoring Suppl (TRUE METRIX AIR GLUCOSE METER) w/Device KIT, 1 Device by Does not apply route 4 (four) times daily. Dx E11.9, Disp: 1 kit, Rfl: 0 .  diltiazem (CARDIZEM CD) 240 MG 24 hr capsule, TAKE 1 CAPSULE EVERY DAY, Disp: 90 capsule, Rfl: 0 .  ferrous sulfate 325 (65 FE) MG tablet, Take 325 mg by mouth daily., Disp: , Rfl:  .  glucose blood (TRUE METRIX BLOOD GLUCOSE TEST) test strip, check glucose up to four times daily Dx E11.9, Disp: 400 strip, Rfl: 3 .  hydrALAZINE (APRESOLINE) 100 MG tablet, Take 1 tablet (100 mg total) by mouth 3 (three) times daily., Disp: 270 tablet, Rfl: 1 .  HYDROcodone-acetaminophen (NORCO/VICODIN) 5-325 MG tablet, Take 1-2 tablets by mouth every 6 (six) hours as needed for severe pain., Disp: 42 tablet, Rfl: 0 .  isosorbide dinitrate (ISORDIL) 10 MG tablet, Take 1 tablet (10 mg total) by mouth 3 (three) times daily., Disp: 270 tablet, Rfl: 1 .  metFORMIN (GLUCOPHAGE-XR) 500 MG 24 hr tablet, TAKE 1 TABLET EVERY DAY, Disp: 90 tablet, Rfl: 0 .  methocarbamol (ROBAXIN) 500 MG tablet, Take 1 tablet (500 mg total) by mouth every 6 (six) hours as needed for muscle spasms., Disp: 40 tablet, Rfl: 0 .  Nebivolol HCl 20 MG TABS, TAKE  1 TABLET EVERY DAY, Disp: 90 tablet, Rfl: 1 .  olmesartan (BENICAR) 40 MG tablet, TAKE ONE TABLET EACH DAY, Disp: 90 tablet, Rfl: 1 .  traMADol (ULTRAM) 50 MG tablet, Take 1-2 tablets (50-100 mg total) by mouth every 6 (six) hours as needed for moderate pain., Disp: 40 tablet, Rfl: 0 .  TRUEplus Lancets 33G MISC, Test QID Dx E11.9, Disp: 400 each, Rfl: 3 .  MODERNA COVID-19 VACCINE 100 MCG/0.5ML injection, , Disp: , Rfl:  No current facility-administered  medications for this visit.  Facility-Administered Medications Ordered in Other Visits:  .  tranexamic acid (CYKLOKAPRON) 2,000 mg in sodium chloride 0.9 % 50 mL Topical Application, 1,610 mg, Topical, Once, Aluisio, Frank, MD   As per HPI otherwise 12 point ROS is negative    Objective:   Physical Exam Constitutional:      General: She is not in acute distress.    Appearance: She is not diaphoretic.  HENT:     Head: Normocephalic and atraumatic.     Right Ear: External ear normal.     Left Ear: External ear normal.     Nose: Nose normal.  Eyes:     General: No scleral icterus.    Extraocular Movements: Extraocular movements intact.     Conjunctiva/sclera: Conjunctivae normal.  Cardiovascular:     Rate and Rhythm: Normal rate and regular rhythm.  Pulmonary:     Effort: Pulmonary effort is normal. No respiratory distress.  Abdominal:     General: There is no distension.    Lymphadenopathy:     Cervical: No cervical adenopathy.  Skin:    General: Skin is warm and dry.     Coloration: Skin is not pale.     Findings: No erythema or rash.  Neurological:     General: No focal deficit present.     Mental Status: She is alert and oriented to person, place, and time.     Coordination: Coordination normal.  Psychiatric:        Attention and Perception: Attention normal.        Mood and Affect: Mood normal. Mood is not anxious.        Behavior: Behavior normal.        Thought Content: Thought content normal.        Judgment: Judgment normal.   Right knee 07/23/2020:     Knee  02/20/2021:    He has some tenderness in along the tibia below her wound.    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  Sed rate was 51 in August prior to her surgery in September\  CRP in August was 3.7 also prior to her surgery.  Really would like her to see Dr. Maureen Ralphs BEFORE I go on to order MRI's etc though given her inconsistent showing up to visits I may end up getting one if she  does not see him and MRI tibia-fibula and femur on the right side.  He feels best surgical option for the patient would be a fusion which she and away functionally has apparently with the type of cement that she currently has and with the rod in the middle of it.  I spent greater than 30 minutes with the patient including greater than 50% of time in face to face counsel of the patient and in coordination of her care including reviewing all of her laboratory data radiographic data and in review with her orthopedic surgeon Dr. Maureen Ralphs.

## 2021-04-06 LAB — BASIC METABOLIC PANEL WITH GFR
BUN/Creatinine Ratio: 27 (calc) — ABNORMAL HIGH (ref 6–22)
BUN: 29 mg/dL — ABNORMAL HIGH (ref 7–25)
CO2: 24 mmol/L (ref 20–32)
Calcium: 9.6 mg/dL (ref 8.6–10.4)
Chloride: 104 mmol/L (ref 98–110)
Creat: 1.08 mg/dL — ABNORMAL HIGH (ref 0.60–0.93)
GFR, Est African American: 57 mL/min/{1.73_m2} — ABNORMAL LOW (ref >=60)
GFR, Est Non African American: 49 mL/min/{1.73_m2} — ABNORMAL LOW (ref >=60)
Glucose, Bld: 110 mg/dL — ABNORMAL HIGH (ref 65–99)
Potassium: 4.2 mmol/L (ref 3.5–5.3)
Sodium: 139 mmol/L (ref 135–146)

## 2021-04-06 LAB — CBC WITH DIFFERENTIAL/PLATELET
Absolute Monocytes: 529 cells/uL (ref 200–950)
Basophils Absolute: 27 cells/uL (ref 0–200)
Basophils Relative: 0.4 %
Eosinophils Absolute: 161 cells/uL (ref 15–500)
Eosinophils Relative: 2.4 %
HCT: 39.3 % (ref 35.0–45.0)
Hemoglobin: 12 g/dL (ref 11.7–15.5)
Lymphs Abs: 1233 cells/uL (ref 850–3900)
MCH: 24.4 pg — ABNORMAL LOW (ref 27.0–33.0)
MCHC: 30.5 g/dL — ABNORMAL LOW (ref 32.0–36.0)
MCV: 79.9 fL — ABNORMAL LOW (ref 80.0–100.0)
MPV: 10 fL (ref 7.5–12.5)
Monocytes Relative: 7.9 %
Neutro Abs: 4750 cells/uL (ref 1500–7800)
Neutrophils Relative %: 70.9 %
Platelets: 359 10*3/uL (ref 140–400)
RBC: 4.92 10*6/uL (ref 3.80–5.10)
RDW: 15.3 % — ABNORMAL HIGH (ref 11.0–15.0)
Total Lymphocyte: 18.4 %
WBC: 6.7 10*3/uL (ref 3.8–10.8)

## 2021-04-06 LAB — SEDIMENTATION RATE: Sed Rate: 72 mm/h — ABNORMAL HIGH (ref 0–30)

## 2021-04-06 LAB — C-REACTIVE PROTEIN: CRP: 13.5 mg/L — ABNORMAL HIGH (ref <8.0)

## 2021-04-22 ENCOUNTER — Other Ambulatory Visit: Payer: Self-pay | Admitting: Family Medicine

## 2021-04-27 DIAGNOSIS — M869 Osteomyelitis, unspecified: Secondary | ICD-10-CM | POA: Diagnosis not present

## 2021-04-27 DIAGNOSIS — L989 Disorder of the skin and subcutaneous tissue, unspecified: Secondary | ICD-10-CM | POA: Diagnosis not present

## 2021-04-27 DIAGNOSIS — R32 Unspecified urinary incontinence: Secondary | ICD-10-CM | POA: Diagnosis not present

## 2021-04-27 DIAGNOSIS — M009 Pyogenic arthritis, unspecified: Secondary | ICD-10-CM | POA: Diagnosis not present

## 2021-04-27 DIAGNOSIS — Z4789 Encounter for other orthopedic aftercare: Secondary | ICD-10-CM | POA: Diagnosis not present

## 2021-04-27 DIAGNOSIS — M549 Dorsalgia, unspecified: Secondary | ICD-10-CM | POA: Diagnosis not present

## 2021-05-16 DIAGNOSIS — Z96651 Presence of right artificial knee joint: Secondary | ICD-10-CM | POA: Diagnosis not present

## 2021-05-20 ENCOUNTER — Other Ambulatory Visit: Payer: Self-pay | Admitting: Family Medicine

## 2021-05-23 ENCOUNTER — Ambulatory Visit: Payer: Medicare HMO | Admitting: Family Medicine

## 2021-05-28 DIAGNOSIS — R32 Unspecified urinary incontinence: Secondary | ICD-10-CM | POA: Diagnosis not present

## 2021-05-28 DIAGNOSIS — Z4789 Encounter for other orthopedic aftercare: Secondary | ICD-10-CM | POA: Diagnosis not present

## 2021-05-28 DIAGNOSIS — L989 Disorder of the skin and subcutaneous tissue, unspecified: Secondary | ICD-10-CM | POA: Diagnosis not present

## 2021-05-28 DIAGNOSIS — M009 Pyogenic arthritis, unspecified: Secondary | ICD-10-CM | POA: Diagnosis not present

## 2021-05-28 DIAGNOSIS — M549 Dorsalgia, unspecified: Secondary | ICD-10-CM | POA: Diagnosis not present

## 2021-05-28 DIAGNOSIS — M869 Osteomyelitis, unspecified: Secondary | ICD-10-CM | POA: Diagnosis not present

## 2021-06-06 ENCOUNTER — Other Ambulatory Visit: Payer: Self-pay

## 2021-06-06 ENCOUNTER — Encounter: Payer: Self-pay | Admitting: Family Medicine

## 2021-06-06 ENCOUNTER — Ambulatory Visit (INDEPENDENT_AMBULATORY_CARE_PROVIDER_SITE_OTHER): Payer: Medicare HMO | Admitting: Family Medicine

## 2021-06-06 VITALS — BP 149/83 | HR 90 | Temp 97.7°F

## 2021-06-06 DIAGNOSIS — E119 Type 2 diabetes mellitus without complications: Secondary | ICD-10-CM | POA: Diagnosis not present

## 2021-06-06 DIAGNOSIS — R011 Cardiac murmur, unspecified: Secondary | ICD-10-CM

## 2021-06-06 DIAGNOSIS — D509 Iron deficiency anemia, unspecified: Secondary | ICD-10-CM

## 2021-06-06 DIAGNOSIS — I10 Essential (primary) hypertension: Secondary | ICD-10-CM | POA: Diagnosis not present

## 2021-06-06 DIAGNOSIS — E782 Mixed hyperlipidemia: Secondary | ICD-10-CM | POA: Diagnosis not present

## 2021-06-06 LAB — BAYER DCA HB A1C WAIVED: HB A1C (BAYER DCA - WAIVED): 5.8 % (ref <7.0)

## 2021-06-06 MED ORDER — DILTIAZEM HCL ER COATED BEADS 240 MG PO CP24: 240.0000 mg | ORAL_CAPSULE | Freq: Every day | ORAL | 3 refills | Status: DC

## 2021-06-06 MED ORDER — ISOSORBIDE DINITRATE 10 MG PO TABS: 10.0000 mg | ORAL_TABLET | Freq: Three times a day (TID) | ORAL | 3 refills | Status: DC

## 2021-06-06 MED ORDER — PREGABALIN 50 MG PO CAPS: ORAL_CAPSULE | ORAL | 1 refills | Status: DC

## 2021-06-06 MED ORDER — NEBIVOLOL HCL 20 MG PO TABS: ORAL_TABLET | ORAL | 3 refills | Status: DC

## 2021-06-06 MED ORDER — HYDRALAZINE HCL 100 MG PO TABS: 100.0000 mg | ORAL_TABLET | Freq: Three times a day (TID) | ORAL | 3 refills | Status: DC

## 2021-06-06 MED ORDER — ATORVASTATIN CALCIUM 20 MG PO TABS: 20.0000 mg | ORAL_TABLET | Freq: Every evening | ORAL | 3 refills | Status: DC

## 2021-06-06 MED ORDER — AMLODIPINE BESYLATE 10 MG PO TABS: 10.0000 mg | ORAL_TABLET | Freq: Every evening | ORAL | 3 refills | Status: DC

## 2021-06-06 MED ORDER — METFORMIN HCL ER 500 MG PO TB24: 500.0000 mg | ORAL_TABLET | Freq: Every day | ORAL | 3 refills | Status: DC

## 2021-06-06 MED ORDER — OLMESARTAN MEDOXOMIL 40 MG PO TABS: ORAL_TABLET | ORAL | 3 refills | Status: DC

## 2021-06-06 NOTE — Progress Notes (Signed)
Subjective:  Patient ID: Beth [name],  female    DOB: 1946/01/02  Age: 75 y.o.    CC: Medical Management of Chronic Issues   HPI Beth [name] presents for  follow-up of hypertension. Patient has no history of headache chest pain or shortness of breath or recent cough. Patient also denies symptoms of TIA such as numbness weakness lateralizing. Patient denies side effects from medication. States taking it regularly.BP up due to pain in right leg.   Patient also  in for follow-up of elevated cholesterol. Doing well without complaints on current medication. Denies side effects  including myalgia and arthralgia and nausea. Also in today for liver function testing. Currently no chest pain, shortness of breath or other cardiovascular related symptoms noted.  Follow-up of diabetes. Patient does check blood sugar at home. Readings run between 120 and 150 Patient denies symptoms such as excessive hunger or urinary frequency, excessive hunger, nausea No significant hypoglycemic spells noted. Medications reviewed. Pt reports taking them regularly. Pt. denies complication/adverse reaction today.    History Beth [name] has a past medical history of Anemia, Arthritis, Blood transfusion without reported diagnosis (2012), Cataract, CKD (chronic kidney disease), stage III ([place]), CVA (cerebral infarction), Diabetes mellitus without complication ([place]), Family history of anesthesia complication, Gout, Groin pain (06/21/2019), Hamstring tendinitis (08/22/2019), Heart murmur, Herpes infection (08-09-14), History of gout (01/04/2019), Hyperlipidemia, Hypertension, Nocturia, Osteoarthritis of both sacroiliac joints ([place]) (08/22/2019), Osteomyelitis of right tibia ([place]) (07/23/2020), Other acute osteomyelitis, right femur ([place]) (07/23/2020), Pseudomonas aeruginosa infection (12/15/2018), and Stroke ([place]) (2006).   She has a past surgical history that includes Carpal tunnel release (Right, 1983); colonscopy (June 21, 2014); nasal  cauterization (2012); Total knee arthroplasty (Right, 07/03/2014); Patellar tendon repair (Right, 08/11/2014); Joint replacement (06/2014); I & D knee with poly exchange (Right, 10/02/2014); Excisional total knee arthroplasty with antibiotic spacers (Right, 02/16/2015); Reimplantation of total knee (Right, 05/23/2015); Abdominal hysterectomy (1983); Tubal ligation; Incision and drainage of wound (Right, 01/14/2017); Excisional total knee arthroplasty with antibiotic spacers (Right, 11/03/2018); and I & D knee with poly exchange (Right, 08/27/2020).   Her family history includes Cancer in her mother; Diabetes in her son and son; Heart disease in her brother; Hypertension in her father; Kidney disease in her daughter; Ovarian cancer in her mother; Peripheral vascular disease in her father.She reports that she has never smoked. She has never used smokeless tobacco. She reports that she does not drink alcohol and does not use drugs.  Current Outpatient Medications on File Prior to Visit  Medication Sig Dispense Refill   acetaminophen (TYLENOL) 500 MG tablet Take 2 tablets (1,000 mg total) by mouth every 6 (six) hours as needed for mild pain. 90 tablet 1   Blood Glucose Calibration (TRUE METRIX LEVEL 1) Low SOLN Use with glucose monitor Dx E11.9 3 each 0   Blood Glucose Monitoring Suppl (TRUE METRIX AIR GLUCOSE METER) w/Device KIT 1 Device by Does not apply route 4 (four) times daily. Dx E11.9 1 kit 0   ferrous sulfate 325 (65 FE) MG tablet Take 325 mg by mouth daily.     glucose blood (TRUE METRIX BLOOD GLUCOSE TEST) test strip check glucose up to four times daily Dx E11.9 400 strip 3   methocarbamol (ROBAXIN) 500 MG tablet Take 1 tablet (500 mg total) by mouth every 6 (six) hours as needed for muscle spasms. 40 tablet 0   traMADol (ULTRAM) 50 MG tablet Take 1-2 tablets (50-100 mg total) by mouth every 6 (six) hours as needed  for moderate pain. 40 tablet 0   TRUEplus Lancets 33G MISC Test QID Dx E11.9 400 each 3    Current Facility-Administered Medications on File Prior to Visit  Medication Dose Route Frequency Provider Last Rate Last Admin   tranexamic acid (CYKLOKAPRON) 2,000 mg in sodium chloride 0.9 % 50 mL Topical Application  7,564 mg Topical Once [name], MD        ROS Review of Systems  Constitutional: Negative.   HENT: Negative.    Eyes:  Negative for visual disturbance.  Respiratory:  Negative for shortness of breath.   Cardiovascular:  Negative for chest pain.  Gastrointestinal:  Negative for abdominal pain.  Musculoskeletal:  Negative for arthralgias.   Objective:  BP (!) 149/83   Pulse 90   Temp 97.7 F (36.5 C)   SpO2 97%   BP Readings from Last 3 Encounters:  06/06/21 (!) 149/83  04/05/21 (!) 190/80  02/20/21 (!) 190/91    Wt Readings from Last 3 Encounters:  04/05/21 213 lb (96.6 kg)  08/28/20 213 lb 13.5 oz (97 kg)  08/21/20 214 lb (97.1 kg)     Physical Exam Constitutional:      General: She is not in acute distress.    Appearance: She is well-developed.  Cardiovascular:     Rate and Rhythm: Normal rate and regular rhythm.  Pulmonary:     Breath sounds: Normal breath sounds.  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 Walks stiff legged. Has to use a rope to hold her foot up in neutral due to foot drop.  Skin:    General: Skin is warm and dry.  Neurological:     Mental Status: She is alert and oriented to person, place, and time.    Diabetic Foot Exam - Simple   No data filed       Assessment & Plan:   Beth [name] was seen today for medical management of chronic issues.  Diagnoses and all orders for this visit:  Controlled type 2 diabetes mellitus without complication, without long-term current use of insulin (HCC) -     Bayer DCA Hb A1c Waived -     CBC with Differential/Platelet  Essential hypertension -     CMP14+EGFR  Mixed hyperlipidemia -     Lipid panel  Iron deficiency anemia,  unspecified iron deficiency anemia type -     CBC with Differential/Platelet  Murmur, cardiac -     Ambulatory referral to Cardiology  Other orders -     amLODipine (NORVASC) 10 MG tablet; Take 1 tablet (10 mg total) by mouth every evening. -     atorvastatin (LIPITOR) 20 MG tablet; Take 1 tablet (20 mg total) by mouth every evening. -     diltiazem (CARDIZEM CD) 240 MG 24 hr capsule; Take 1 capsule (240 mg total) by mouth daily. -     hydrALAZINE (APRESOLINE) 100 MG tablet; Take 1 tablet (100 mg total) by mouth 3 (three) times daily. -     isosorbide dinitrate (ISORDIL) 10 MG tablet; Take 1 tablet (10 mg total) by mouth 3 (three) times daily. -     metFORMIN (GLUCOPHAGE-XR) 500 MG 24 hr tablet; Take 1 tablet (500 mg total) by mouth daily. -     Nebivolol HCl 20 MG TABS; TAKE 1 TABLET EVERY DAY -     olmesartan (BENICAR) 40 MG tablet; TAKE ONE (1) TABLET EACH DAY -  pregabalin (LYRICA) 50 MG capsule; 1 qhs X7 days , then 2 qhs X 7d, then 3 qhs X 7d, then 4 qhs  I have discontinued Beth [name] HYDROcodone-acetaminophen and Moderna COVID-19 Vaccine. I have also changed her atorvastatin, diltiazem, and metFORMIN. Additionally, I am having her start on pregabalin. Lastly, I am having her maintain her acetaminophen, ferrous sulfate, True Metrix Air Glucose Meter, True Metrix Level 1, TRUEplus Lancets 33G, traMADol, methocarbamol, True Metrix Blood Glucose Test, amLODipine, hydrALAZINE, isosorbide dinitrate, Nebivolol HCl, and olmesartan.  Meds ordered this encounter  Medications   amLODipine (NORVASC) 10 MG tablet    Sig: Take 1 tablet (10 mg total) by mouth every evening.    Dispense:  90 tablet    Refill:  3   atorvastatin (LIPITOR) 20 MG tablet    Sig: Take 1 tablet (20 mg total) by mouth every evening.    Dispense:  90 tablet    Refill:  3   diltiazem (CARDIZEM CD) 240 MG 24 hr capsule    Sig: Take 1 capsule (240 mg total) by mouth daily.    Dispense:  90 capsule    Refill:  3    hydrALAZINE (APRESOLINE) 100 MG tablet    Sig: Take 1 tablet (100 mg total) by mouth 3 (three) times daily.    Dispense:  270 tablet    Refill:  3   isosorbide dinitrate (ISORDIL) 10 MG tablet    Sig: Take 1 tablet (10 mg total) by mouth 3 (three) times daily.    Dispense:  270 tablet    Refill:  3   metFORMIN (GLUCOPHAGE-XR) 500 MG 24 hr tablet    Sig: Take 1 tablet (500 mg total) by mouth daily.    Dispense:  90 tablet    Refill:  3   Nebivolol HCl 20 MG TABS    Sig: TAKE 1 TABLET EVERY DAY    Dispense:  90 tablet    Refill:  3   olmesartan (BENICAR) 40 MG tablet    Sig: TAKE ONE (1) TABLET EACH DAY    Dispense:  90 tablet    Refill:  3   pregabalin (LYRICA) 50 MG capsule    Sig: 1 qhs X7 days , then 2 qhs X 7d, then 3 qhs X 7d, then 4 qhs    Dispense:  120 capsule    Refill:  1     Follow-up: Return in about 3 months (around 09/06/2021).  Claretta Fraise, M.D.

## 2021-06-07 ENCOUNTER — Encounter: Payer: Self-pay | Admitting: Family Medicine

## 2021-06-07 LAB — CMP14+EGFR
ALT: 3 IU/L (ref 0–32)
AST: 12 IU/L (ref 0–40)
Albumin/Globulin Ratio: 1.2 (ref 1.2–2.2)
Albumin: 4 g/dL (ref 3.7–4.7)
Alkaline Phosphatase: 161 IU/L — ABNORMAL HIGH (ref 44–121)
BUN/Creatinine Ratio: 15 (ref 12–28)
BUN: 24 mg/dL (ref 8–27)
Bilirubin Total: 0.5 mg/dL (ref 0.0–1.2)
CO2: 21 mmol/L (ref 20–29)
Calcium: 9.1 mg/dL (ref 8.7–10.3)
Chloride: 100 mmol/L (ref 96–106)
Creatinine, Ser: 1.55 mg/dL — ABNORMAL HIGH (ref 0.57–1.00)
Globulin, Total: 3.3 g/dL (ref 1.5–4.5)
Glucose: 121 mg/dL — ABNORMAL HIGH (ref 65–99)
Potassium: 4.5 mmol/L (ref 3.5–5.2)
Sodium: 137 mmol/L (ref 134–144)
Total Protein: 7.3 g/dL (ref 6.0–8.5)
eGFR: 34 mL/min/{1.73_m2} — ABNORMAL LOW (ref >59)

## 2021-06-07 LAB — LIPID PANEL
Chol/HDL Ratio: 3 ratio (ref 0.0–4.4)
Cholesterol, Total: 178 mg/dL (ref 100–199)
HDL: 59 mg/dL (ref >39)
LDL Chol Calc (NIH): 99 mg/dL (ref 0–99)
Triglycerides: 111 mg/dL (ref 0–149)
VLDL Cholesterol Cal: 20 mg/dL (ref 5–40)

## 2021-06-07 LAB — CBC WITH DIFFERENTIAL/PLATELET
Basophils Absolute: 0.1 10*3/uL (ref 0.0–0.2)
Basos: 1 %
EOS (ABSOLUTE): 0.2 10*3/uL (ref 0.0–0.4)
Eos: 3 %
Hematocrit: 35.8 % (ref 34.0–46.6)
Hemoglobin: 11.2 g/dL (ref 11.1–15.9)
Immature Grans (Abs): 0 10*3/uL (ref 0.0–0.1)
Immature Granulocytes: 0 %
Lymphocytes Absolute: 1.5 10*3/uL (ref 0.7–3.1)
Lymphs: 23 %
MCH: 24.7 pg — ABNORMAL LOW (ref 26.6–33.0)
MCHC: 31.3 g/dL — ABNORMAL LOW (ref 31.5–35.7)
MCV: 79 fL (ref 79–97)
Monocytes Absolute: 0.6 10*3/uL (ref 0.1–0.9)
Monocytes: 9 %
Neutrophils Absolute: 4.2 10*3/uL (ref 1.4–7.0)
Neutrophils: 64 %
Platelets: 381 10*3/uL (ref 150–450)
RBC: 4.54 x10E6/uL (ref 3.77–5.28)
RDW: 15 % (ref 11.7–15.4)
WBC: 6.5 10*3/uL (ref 3.4–10.8)

## 2021-06-21 DIAGNOSIS — Z471 Aftercare following joint replacement surgery: Secondary | ICD-10-CM | POA: Diagnosis not present

## 2021-06-21 DIAGNOSIS — Z96651 Presence of right artificial knee joint: Secondary | ICD-10-CM | POA: Diagnosis not present

## 2021-06-27 DIAGNOSIS — M549 Dorsalgia, unspecified: Secondary | ICD-10-CM | POA: Diagnosis not present

## 2021-06-27 DIAGNOSIS — M009 Pyogenic arthritis, unspecified: Secondary | ICD-10-CM | POA: Diagnosis not present

## 2021-06-27 DIAGNOSIS — L989 Disorder of the skin and subcutaneous tissue, unspecified: Secondary | ICD-10-CM | POA: Diagnosis not present

## 2021-06-27 DIAGNOSIS — R32 Unspecified urinary incontinence: Secondary | ICD-10-CM | POA: Diagnosis not present

## 2021-06-27 DIAGNOSIS — Z4789 Encounter for other orthopedic aftercare: Secondary | ICD-10-CM | POA: Diagnosis not present

## 2021-06-27 DIAGNOSIS — M869 Osteomyelitis, unspecified: Secondary | ICD-10-CM | POA: Diagnosis not present

## 2021-07-18 ENCOUNTER — Ambulatory Visit: Payer: Medicare HMO | Admitting: Infectious Disease

## 2021-07-22 ENCOUNTER — Other Ambulatory Visit: Payer: Self-pay

## 2021-07-22 ENCOUNTER — Ambulatory Visit: Payer: Medicare HMO | Admitting: Infectious Disease

## 2021-07-22 VITALS — BP 195/97 | HR 67 | Temp 97.3°F | Resp 16 | Ht 66.0 in | Wt 213.0 lb

## 2021-07-22 DIAGNOSIS — I10 Essential (primary) hypertension: Secondary | ICD-10-CM

## 2021-07-22 DIAGNOSIS — M76891 Other specified enthesopathies of right lower limb, excluding foot: Secondary | ICD-10-CM

## 2021-07-22 DIAGNOSIS — M1A9XX0 Chronic gout, unspecified, without tophus (tophi): Secondary | ICD-10-CM | POA: Diagnosis not present

## 2021-07-22 DIAGNOSIS — M86661 Other chronic osteomyelitis, right tibia and fibula: Secondary | ICD-10-CM | POA: Diagnosis not present

## 2021-07-22 DIAGNOSIS — T8453XD Infection and inflammatory reaction due to internal right knee prosthesis, subsequent encounter: Secondary | ICD-10-CM

## 2021-07-22 DIAGNOSIS — M009 Pyogenic arthritis, unspecified: Secondary | ICD-10-CM

## 2021-07-22 NOTE — Progress Notes (Signed)
Subjective:   Chief complaint: Continued pain along her tibia   l Patient ID: Beth [name], female    DOB: 1945-12-23, 75 y.o.   MRN: [id number]  HPI  Beth [name] is a 75  y.o. 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  She apparently originally had right knee total arthroplasty in July 2015.  In the interim she had an I&D in October 2015 with poly-exchange followed by antibiotics.  She then had in 2016 a right knee resection arthroplasty with placement of antibiotic spacers followed by antibiotics and followed by reimplantation of new prosthetic knee.  Unfortunately she again had failure and of the attempts to eradicate infection in his knee and her prosthetic knee has been removed and antibiotic spacer placed   Is my understanding that no organism had been isolated in the past.    She subsequently grew a pansensitive pseudomonal species.  She was discharged on cefepime but then admitted with renal failure and changed to oral ciprofloxacin.  She was due to complete the ciprofloxacin on 17 December 2018    In the interim she was seen by me and we rechecked inflammatory markers which were still quite high sed rate still in the 70s and CRP had gone up into the 20s.  Therefore I had continued her on oral ciprofloxacin in the nursing facility.  When she was discharged some skilled nurse facility on 28 December 2019 they unfortunately did not send her home with oral antibiotics.  She then resumed the oral l ciprofloxacin.  However in the interim she had developed some severe right-sided groin pain   We obtained an MRI of the hip in August 2020 which did not show osteomyelitis, or septic hep but did show severe osteoarthritic changes of the sacroiliac joints bilaterally and also question of some hamstring tendinosis.  [name] saw the patient in clinic reviewed films and had some concern whether or not the tendinosis seen on imaging could be  related to chronic fluoroquinolone usage.  The patient's pain in her knee had improved as mentioned inflammatory markers had trended down.  Patient was taken off ciprofloxacin to see how she would do both in terms of her groin and hip pain and also her knee pain.  Since then her hip pain  improved dramatically suggesting the possibility that fluoroquinolones could have been in play in terms of causing a tendinopathy.  Her knee pain had remained stable off of antibiotics  Since August of 2020.  Several visits aggo was encouraged by how she was doing but when I checked her inflammatory markers they had become more elevated.  She had been seen by Dr. Maureen Ralphs and recounts him removing fluid from her knee and sending this for analysis and culture.  However when I went into "[facility] portal the notes indicated that fluid cannot be withdrawn.  Dr. Maureen Ralphs also mentioned doing an MRI of the knee prior to considering reimplanting a new knee.    Did obtain MRI of the knee on December 28, 2019 and this was read as showing:   Antibiotic laden cement with some postsurgical changes in the distal femur and proximal tibia there was some irregular enhancement on the margins of the tracks that were felt by radiology possibly concerning for osteomyelitis versus residual postsurgical changes.  I  personally reviewed these films  I alerted Dr. Maureen Ralphs re this MRI and he also reviewed himself.  He felt the changes were  more consistent with abrasive changes that can happen in the context of chronic antibiotic cement being in place he was less concerned by the radiographic appearance for possible osteomyelitis.  We both continued to be concerned about her elevated inflammatory markers.  She does have a history of gout though it has not flared all in recent times she is really not on any medicines for gout at present.   4 months after her MRI.  Knee pain was stable largely occurs at night or if she shifts  around and bears weight on it.  Inflammatory markers were persistently abnormal.  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 No progressive bone destruction was seen.   Dr Maureen Ralphs took her to the OR in August 28, 2020 and performed I and D , removal of antibiotic spacer, with  Debridement of the femoral canal as well as the tibia, placement of new antibiotic spacer with 2 steinman pins in them that effectively have left patient with leg with knee unbent giving it similar appearance to a fused knee  She was seen by my partner Dr. Johnnye Sima and she did not want to be on IV antibiotics> cultures from femur, joint and tibia were unrevealing . Dr. Johnnye Sima placed her on levaquin with plan to treat for protracted time period and for he to followup in [facility] in 2-3 weeks post dc.  Instead she never made it to [facility] until I saw her on  March 116th, 2022. Additionally she stopped her levaquin--likely in October.   Her inflammatory markers were still elevated.  I did talk to Dr. Maureen Ralphs there will times recently including the most recent visits.  He was confident the degree of debridement he achieved that infected bone was removed surgically though she did ave the desired antibiotic course that we had recommended at that time in the fall.  Was at last visit   complaining more pain now in her area of the tibia which is tender to palpation.  Recheck labs at that visit and marked inflammatory markers remained persistently elevated.  She has been seen at [facility] in Dr Alusio's office.  They obtain plain films which showed that the knee had settled into a varus position with shifting of the distal pins.  X-rays reviewed by Dr. Maureen Ralphs as  well.  Vehicle options were reviewed with the patient but she still is very much against having any further surgical interventions.  Continues to have pain at the site.  Getting to \visits is a fair only arduous task for her and her daughter.  We agreed to make the next visit with me a virtual 1 using video feed in 6 months time.       Past Medical History:  Diagnosis Date   Anemia    Arthritis    Knee both knees   Blood transfusion without reported diagnosis 2012   anemia;pt denies transfusion stated was only on iron tablet   Cataract    left   CKD (chronic kidney disease), stage III (HCC)    CVA (cerebral infarction)    2006   Diabetes mellitus without complication ([place])    Family history of anesthesia complication    sister very slow to awaken after anesthesia;severe vomiting    Gout  left elbow   Groin pain 06/21/2019   Hamstring tendinitis 08/22/2019   Heart murmur    Herpes infection 08-09-14   Saw doctor Wed. 08-09-14 Right eye   History of gout 01/04/2019   Hyperlipidemia    Hypertension    Nocturia    3-4 times per night   Osteoarthritis of both sacroiliac joints ([place]) 08/22/2019   Osteomyelitis of right tibia ([place]) 07/23/2020   Other acute osteomyelitis, right femur ([place]) 07/23/2020   Pseudomonas aeruginosa infection 12/15/2018   Stroke ([place]) 2006   x 1 no deficits noted     Past Surgical History:  Procedure Laterality Date   [place] Right 1983   colonscopy  June 21, 2014   EXCISIONAL TOTAL KNEE ARTHROPLASTY WITH ANTIBIOTIC SPACERS Right 02/16/2015   Procedure: RIGHT KNEE RESECTION ARTHROPLASTY WITH ANTIBIOTIC SPACERS;  Surgeon: [name], MD;  Location: [facility];  Service: Orthopedics;  Laterality: Right;   EXCISIONAL TOTAL KNEE ARTHROPLASTY WITH ANTIBIOTIC SPACERS Right 11/03/2018   Procedure: Right knee resection arthroplasty; antibiotic spacer;  Surgeon: [name], MD;  Location: [facility];  Service:  Orthopedics;  Laterality: Right;  Adductor Block   I & D KNEE WITH POLY EXCHANGE Right 10/02/2014   Procedure: IRRIGATION AND DEBRIDEMENT RIGHT KNEE WITH POLY EXCHANGE;  Surgeon: Gearlean Alf, MD;  Location: [facility];  Service: Orthopedics;  Laterality: Right;   I & D KNEE WITH POLY EXCHANGE Right 08/27/2020   Procedure: IRRIGATION AND DEBRIDEMENT; SPACER EXCHANGE RIGHT KNEE with multiple specimens;  Surgeon: [name], MD;  Location: [facility];  Service: Orthopedics;  Laterality: Right;  57mn   INCISION AND DRAINAGE OF WOUND Right 01/14/2017   Procedure: IRRIGATION AND DEBRIDEMENT WOUND;  Surgeon: FGaynelle Arabian MD;  Location: [facility];  Service: Orthopedics;  Laterality: Right;  requests 456ms   JOINT REPLACEMENT  06/2014   right knee   nasal cauterization  2012   PATELLAR TENDON REPAIR Right 08/11/2014   Procedure: RIGHT PATELLA TENDON REPAIR;  Surgeon: FrGearlean AlfMD;  Location: [facility];  Service: Orthopedics;  Laterality: Right;   REIMPLANTATION OF TOTAL KNEE Right 05/23/2015   Procedure: RIGHT KNEE ARTHROPLASTY REIMPLANTATION;  Surgeon: FrGaynelle ArabianMD;  Location: [facility];  Service: Orthopedics;  Laterality: Right;   TOTAL KNEE ARTHROPLASTY Right 07/03/2014   Procedure: RIGHT TOTAL KNEE ARTHROPLASTY;  Surgeon: FrGearlean AlfMD;  Location: [facility];  Service: Orthopedics;  Laterality: Right;   TUBAL LIGATION      Family History  Problem Relation Age of Onset   Ovarian cancer Mother    Cancer Mother    Diabetes Son    Diabetes Son    Peripheral vascular disease Father        with amputation of both legs   Hypertension Father    Heart disease Brother    Kidney disease Daughter    Colon cancer Neg Hx    Esophageal cancer Neg Hx    Stomach cancer Neg Hx    Rectal cancer Neg Hx       Social History   Socioeconomic History   Marital status: Widowed    Spouse name: Not on file   Number of children: 9   Years of education: 8th   Highest education level: 8th grade   Occupational History    Employer: RETIRED  Tobacco Use   Smoking status: Never   Smokeless tobacco: Never  Vaping Use   Vaping Use: Never used  Substance  and Sexual Activity   Alcohol use: No   Drug use: No   Sexual activity: Not Currently  Other Topics Concern   Not on file  Social History Narrative   She has 6 living adult children and 3 who have passed away and many grandchildren and great grandchildren. Patient is retired. 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    Social Determinants of Health   Financial Resource Strain: Low Risk    Difficulty of Paying Living Expenses: Not hard at all  Food Insecurity: No Food Insecurity   Worried About Charity fundraiser in the Last Year: Never true   [place] in the Last Year: Never true  Transportation Needs: No Transportation Needs   Lack of Transportation (Medical): No   Lack of Transportation (Non-Medical): No  Physical Activity: Insufficiently Active   Days of Exercise per Week: 7 days   Minutes of Exercise per Session: 20 min  Stress: Not on file  Social Connections: Moderately Isolated   Frequency of Communication with Friends and Family: More than three times a week   Frequency of Social Gatherings with Friends and Family: More than three times a week   Attends Religious Services: More than 4 times per year   Active Member of [employer] or Organizations: No   Attends Archivist Meetings: Never   Marital Status: Divorced    Allergies  Allergen Reactions   Aleve [Naproxen Sodium] Other (See Comments)    Heart races   Asa [Aspirin] Other (See Comments)    Nose bleeding   Ciprofloxacin Other (See Comments)    Possible hamstring tendinopathy   Clonidine Derivatives Other (See Comments)    Dizziness and weakness   Shellfish Allergy Nausea And Vomiting     Current Outpatient Medications:    acetaminophen (TYLENOL) 500 MG tablet, Take 2 tablets (1,000 mg total) by mouth every 6 (six)  hours as needed for mild pain., Disp: 90 tablet, Rfl: 1   amLODipine (NORVASC) 10 MG tablet, Take 1 tablet (10 mg total) by mouth every evening., Disp: 90 tablet, Rfl: 3   atorvastatin (LIPITOR) 20 MG tablet, Take 1 tablet (20 mg total) by mouth every evening., Disp: 90 tablet, Rfl: 3   Blood Glucose Calibration (TRUE METRIX LEVEL 1) Low SOLN, Use with glucose monitor Dx E11.9, Disp: 3 each, Rfl: 0   Blood Glucose Monitoring Suppl (TRUE METRIX AIR GLUCOSE METER) w/Device KIT, 1 Device by Does not apply route 4 (four) times daily. Dx E11.9, Disp: 1 kit, Rfl: 0   diltiazem (CARDIZEM CD) 240 MG 24 hr capsule, Take 1 capsule (240 mg total) by mouth daily., Disp: 90 capsule, Rfl: 3   ferrous sulfate 325 (65 FE) MG tablet, Take 325 mg by mouth daily., Disp: , Rfl:    glucose blood (TRUE METRIX BLOOD GLUCOSE TEST) test strip, check glucose up to four times daily Dx E11.9, Disp: 400 strip, Rfl: 3   hydrALAZINE (APRESOLINE) 100 MG tablet, Take 1 tablet (100 mg total) by mouth 3 (three) times daily., Disp: 270 tablet, Rfl: 3   isosorbide dinitrate (ISORDIL) 10 MG tablet, Take 1 tablet (10 mg total) by mouth 3 (three) times daily., Disp: 270 tablet, Rfl: 3   metFORMIN (GLUCOPHAGE-XR) 500 MG 24 hr tablet, Take 1 tablet (500 mg total) by mouth daily., Disp: 90 tablet, Rfl: 3   methocarbamol (ROBAXIN) 500 MG tablet, Take 1 tablet (500 mg total) by mouth every 6 (six) hours as needed  for muscle spasms., Disp: 40 tablet, Rfl: 0   Nebivolol HCl 20 MG TABS, TAKE 1 TABLET EVERY DAY, Disp: 90 tablet, Rfl: 3   olmesartan (BENICAR) 40 MG tablet, TAKE ONE (1) TABLET EACH DAY, Disp: 90 tablet, Rfl: 3   pregabalin (LYRICA) 50 MG capsule, 1 qhs X7 days , then 2 qhs X 7d, then 3 qhs X 7d, then 4 qhs, Disp: 120 capsule, Rfl: 1   traMADol (ULTRAM) 50 MG tablet, Take 1-2 tablets (50-100 mg total) by mouth every 6 (six) hours as needed for moderate pain., Disp: 40 tablet, Rfl: 0   TRUEplus Lancets 33G MISC, Test QID Dx E11.9,  Disp: 400 each, Rfl: 3 No current facility-administered medications for this visit.  Facility-Administered Medications Ordered in Other Visits:    tranexamic acid (CYKLOKAPRON) 2,000 mg in sodium chloride 0.9 % 50 mL Topical Application, 7,903 mg, Topical, Once, Aluisio, Frank, MD   As in HPI otherwise 12 point view of systems negative    Objective:    Physical exam:  General alert and oriented x3 no acute distress  HEENT normocephalic atraumatic  Cardiovascular regular rate and rhythm  Pulmonary no wheezes respiratory distress  GI soft nondistended  Right knee 07/23/2020:     Knee  02/20/2021:   July 22, 2021: Continues to have some tenderness along the tibia   Assessment & Plan:   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  She asked if these labs could be done at home and I was not sure who build to do that without having a home health company regularly involved in her care.  I did suggest that she could have these labs added to labs with her primary care physician.  Last 3 ESR and CRP reviewed along with trender. Dr Alusio's note from [facility] read  Counselled her and her daughter about the potential that she still might be harboring osteomyelitis and that this infection could potentially declare itself and cause sepsis though I am hoping this will not happen.    Continue to follow-up with Dr. Maureen Ralphs regularly   Gout: Certainly could confound the crepitation of inflammatory markers does not have any recent  flares  Hypertension: needs better control even if part ofthis is white coat HTN  Vitals:   07/22/21 1128  BP: (!) 195/97  Pulse: 67  Resp: 16  Temp: (!) 97.3 F (36.3 C)  SpO2: 96%    Tendinitis: At 1 point thought due to fluoroquinolone. Not active.

## 2021-07-22 NOTE — Patient Instructions (Signed)
Video visit in 6 months

## 2021-07-23 LAB — C-REACTIVE PROTEIN: CRP: 9.3 mg/L — ABNORMAL HIGH (ref <8.0)

## 2021-07-23 LAB — BASIC METABOLIC PANEL WITH GFR
BUN/Creatinine Ratio: 20 (calc) (ref 6–22)
BUN: 25 mg/dL (ref 7–25)
CO2: 26 mmol/L (ref 20–32)
Calcium: 9.4 mg/dL (ref 8.6–10.4)
Chloride: 103 mmol/L (ref 98–110)
Creat: 1.27 mg/dL — ABNORMAL HIGH (ref 0.60–1.00)
Glucose, Bld: 125 mg/dL — ABNORMAL HIGH (ref 65–99)
Potassium: 4.1 mmol/L (ref 3.5–5.3)
Sodium: 139 mmol/L (ref 135–146)
eGFR: 44 mL/min/{1.73_m2} — ABNORMAL LOW (ref >=60)

## 2021-07-23 LAB — SEDIMENTATION RATE: Sed Rate: 55 mm/h — ABNORMAL HIGH (ref 0–30)

## 2021-07-24 ENCOUNTER — Ambulatory Visit: Payer: Medicare HMO | Admitting: Family Medicine

## 2021-07-28 DIAGNOSIS — R32 Unspecified urinary incontinence: Secondary | ICD-10-CM | POA: Diagnosis not present

## 2021-07-28 DIAGNOSIS — M869 Osteomyelitis, unspecified: Secondary | ICD-10-CM | POA: Diagnosis not present

## 2021-07-28 DIAGNOSIS — L989 Disorder of the skin and subcutaneous tissue, unspecified: Secondary | ICD-10-CM | POA: Diagnosis not present

## 2021-07-28 DIAGNOSIS — M009 Pyogenic arthritis, unspecified: Secondary | ICD-10-CM | POA: Diagnosis not present

## 2021-07-28 DIAGNOSIS — M549 Dorsalgia, unspecified: Secondary | ICD-10-CM | POA: Diagnosis not present

## 2021-07-28 DIAGNOSIS — Z4789 Encounter for other orthopedic aftercare: Secondary | ICD-10-CM | POA: Diagnosis not present

## 2021-08-28 DIAGNOSIS — M549 Dorsalgia, unspecified: Secondary | ICD-10-CM | POA: Diagnosis not present

## 2021-08-28 DIAGNOSIS — M869 Osteomyelitis, unspecified: Secondary | ICD-10-CM | POA: Diagnosis not present

## 2021-08-28 DIAGNOSIS — L989 Disorder of the skin and subcutaneous tissue, unspecified: Secondary | ICD-10-CM | POA: Diagnosis not present

## 2021-08-28 DIAGNOSIS — Z4789 Encounter for other orthopedic aftercare: Secondary | ICD-10-CM | POA: Diagnosis not present

## 2021-08-28 DIAGNOSIS — R32 Unspecified urinary incontinence: Secondary | ICD-10-CM | POA: Diagnosis not present

## 2021-08-28 DIAGNOSIS — M009 Pyogenic arthritis, unspecified: Secondary | ICD-10-CM | POA: Diagnosis not present

## 2021-08-29 DIAGNOSIS — M79676 Pain in unspecified toe(s): Secondary | ICD-10-CM | POA: Diagnosis not present

## 2021-08-29 DIAGNOSIS — E1142 Type 2 diabetes mellitus with diabetic polyneuropathy: Secondary | ICD-10-CM | POA: Diagnosis not present

## 2021-08-29 DIAGNOSIS — L84 Corns and callosities: Secondary | ICD-10-CM | POA: Diagnosis not present

## 2021-08-29 DIAGNOSIS — B351 Tinea unguium: Secondary | ICD-10-CM | POA: Diagnosis not present

## 2021-09-24 ENCOUNTER — Encounter: Payer: Self-pay | Admitting: Cardiology

## 2021-09-24 DIAGNOSIS — R011 Cardiac murmur, unspecified: Secondary | ICD-10-CM | POA: Insufficient documentation

## 2021-09-24 NOTE — Progress Notes (Signed)
Cardiology Office Note   Date:  09/25/2021   ID:  Beth, [name] December 28, 1945, MRN [id number]  PCP:  Claretta Fraise, MD  Cardiologist:   None Referring:  Claretta Fraise, MD  Chief Complaint  Patient presents with   Heart Murmur       History of Present Illness: Beth [name] is a 75 y.o. female who is referred by Claretta Fraise, MD for evaluation of a heart murmur.  She has no past cardiac history.  She has had 7 knee surgeries so she gets around very slowly.  She can get up out of the bed around with a walker at home.  With this level of activity she does not have any shortness of breath, chest discomfort, neck or arm discomfort.  At rest she does not have any PND or orthopnea.  She does not report palpitations and has had no presyncope or syncope.  She has had chronic lower extremity swelling particularly in the right leg which has had the surgeries.  She has not had any weight gain or change in this edema.  She was noted recently to have a heart murmur and she said she has been told in the past about this.   Past Medical History:  Diagnosis Date   Anemia    Arthritis    Knee both knees   Blood transfusion without reported diagnosis 2012   anemia;pt denies transfusion stated was only on iron tablet   Cataract    left   CKD (chronic kidney disease), stage III ([place])    Diabetes mellitus without complication ([place])    Family history of anesthesia complication    sister very slow to awaken after anesthesia;severe vomiting    Gout    left elbow   Herpes infection 08/09/2014   Saw doctor Wed. 08-09-14 Right eye   Hyperlipidemia    Hypertension    Nocturia    3-4 times per night   Osteoarthritis of both sacroiliac joints ([place]) 08/22/2019   Osteomyelitis of right tibia ([place]) 07/23/2020   Other acute osteomyelitis, right femur ([place]) 07/23/2020   Pseudomonas aeruginosa infection 12/15/2018   Stroke ([place]) 2006   x 1 no deficits noted     Past Surgical History:  Procedure  Laterality Date   [place] Right 1983   colonscopy  June 21, 2014   EXCISIONAL TOTAL KNEE ARTHROPLASTY WITH ANTIBIOTIC SPACERS Right 02/16/2015   Procedure: RIGHT KNEE RESECTION ARTHROPLASTY WITH ANTIBIOTIC SPACERS;  Surgeon: [name], MD;  Location: [facility];  Service: Orthopedics;  Laterality: Right;   EXCISIONAL TOTAL KNEE ARTHROPLASTY WITH ANTIBIOTIC SPACERS Right 11/03/2018   Procedure: Right knee resection arthroplasty; antibiotic spacer;  Surgeon: [name], MD;  Location: [facility];  Service: Orthopedics;  Laterality: Right;  Adductor Block   I & D KNEE WITH POLY EXCHANGE Right 10/02/2014   Procedure: IRRIGATION AND DEBRIDEMENT RIGHT KNEE WITH POLY EXCHANGE;  Surgeon: Gearlean Alf, MD;  Location: [facility];  Service: Orthopedics;  Laterality: Right;   I & D KNEE WITH POLY EXCHANGE Right 08/27/2020   Procedure: IRRIGATION AND DEBRIDEMENT; SPACER EXCHANGE RIGHT KNEE with multiple specimens;  Surgeon: [name], MD;  Location: [facility];  Service: Orthopedics;  Laterality: Right;  12mn   INCISION AND DRAINAGE OF WOUND Right 01/14/2017   Procedure: IRRIGATION AND DEBRIDEMENT WOUND;  Surgeon: FGaynelle Arabian MD;  Location: [facility];  Service: Orthopedics;  Laterality: Right;  requests 419ms   JOINT  REPLACEMENT  06/2014   right knee   nasal cauterization  2012   PATELLAR TENDON REPAIR Right 08/11/2014   Procedure: RIGHT PATELLA TENDON REPAIR;  Surgeon: Gearlean Alf, MD;  Location: [facility];  Service: Orthopedics;  Laterality: Right;   REIMPLANTATION OF TOTAL KNEE Right 05/23/2015   Procedure: RIGHT KNEE ARTHROPLASTY REIMPLANTATION;  Surgeon: [name], MD;  Location: [facility];  Service: Orthopedics;  Laterality: Right;   TOTAL KNEE ARTHROPLASTY Right 07/03/2014   Procedure: RIGHT TOTAL KNEE ARTHROPLASTY;  Surgeon: Gearlean Alf, MD;  Location: [facility];  Service: Orthopedics;  Laterality: Right;   TUBAL LIGATION       Current Outpatient  Medications  Medication Sig Dispense Refill   atorvastatin (LIPITOR) 20 MG tablet Take 1 tablet (20 mg total) by mouth every evening. 90 tablet 3   diltiazem (CARDIZEM CD) 240 MG 24 hr capsule Take 1 capsule (240 mg total) by mouth daily. 90 capsule 3   ferrous sulfate 325 (65 FE) MG tablet Take 325 mg by mouth daily.     hydrALAZINE (APRESOLINE) 100 MG tablet Take 1 tablet (100 mg total) by mouth 3 (three) times daily. 270 tablet 3   isosorbide dinitrate (ISORDIL) 10 MG tablet Take 1 tablet (10 mg total) by mouth 3 (three) times daily. 270 tablet 3   metFORMIN (GLUCOPHAGE-XR) 500 MG 24 hr tablet Take 1 tablet (500 mg total) by mouth daily. 90 tablet 3   Nebivolol HCl 20 MG TABS TAKE 1 TABLET EVERY DAY 90 tablet 3   Omega-3 Fatty Acids (FISH OIL) 1000 MG CAPS Take 1,000 mg by mouth. Take one tablet by mouth 2 to 3 times weekly     traMADol (ULTRAM) 50 MG tablet Take 1-2 tablets (50-100 mg total) by mouth every 6 (six) hours as needed for moderate pain. 40 tablet 0   acetaminophen (TYLENOL) 500 MG tablet Take 2 tablets (1,000 mg total) by mouth every 6 (six) hours as needed for mild pain. (Patient not taking: Reported on 09/25/2021) 90 tablet 1   amLODipine (NORVASC) 10 MG tablet Take 1 tablet (10 mg total) by mouth every evening. (Patient not taking: Reported on 09/25/2021) 90 tablet 3   Blood Glucose Calibration (TRUE METRIX LEVEL 1) Low SOLN Use with glucose monitor Dx E11.9 3 each 0   Blood Glucose Monitoring Suppl (TRUE METRIX AIR GLUCOSE METER) w/Device KIT 1 Device by Does not apply route 4 (four) times daily. Dx E11.9 1 kit 0   glucose blood (TRUE METRIX BLOOD GLUCOSE TEST) test strip check glucose up to four times daily Dx E11.9 400 strip 3   methocarbamol (ROBAXIN) 500 MG tablet Take 1 tablet (500 mg total) by mouth every 6 (six) hours as needed for muscle spasms. (Patient not taking: Reported on 09/25/2021) 40 tablet 0   olmesartan (BENICAR) 40 MG tablet TAKE ONE (1) TABLET EACH DAY  (Patient not taking: Reported on 09/25/2021) 90 tablet 3   pregabalin (LYRICA) 50 MG capsule 1 qhs X7 days , then 2 qhs X 7d, then 3 qhs X 7d, then 4 qhs (Patient not taking: Reported on 09/25/2021) 120 capsule 1   TRUEplus Lancets 33G MISC Test QID Dx E11.9 400 each 3   No current facility-administered medications for this visit.   Facility-Administered Medications Ordered in Other Visits  Medication Dose Route Frequency Provider Last Rate Last Admin   tranexamic acid (CYKLOKAPRON) 2,000 mg in sodium chloride 0.9 % 50 mL Topical Application  8,676 mg Topical Once [name],  MD        Allergies:   Aleve [naproxen sodium], Asa [aspirin], Ciprofloxacin, Clonidine derivatives, and Shellfish allergy    Social History:  The patient  reports that she has never smoked. She has never used smokeless tobacco. She reports that she does not drink alcohol and does not use drugs.   Family History:  The patient's family history includes Cancer in her mother; Diabetes in her son and son; Heart disease (age of onset: 64) in her daughter; Heart disease (age of onset: 46) in her brother; Hypertension in her father; Kidney disease in her daughter; Ovarian cancer in her mother; Peripheral vascular disease in her father.    ROS:  Please see the history of present illness.   Otherwise, review of systems are positive for none.   All other systems are reviewed and negative.    PHYSICAL EXAM: VS:  BP 120/70   Pulse (!) 55   Ht 5' 6"  (1.676 m)   Wt 233 lb (105.7 kg)   BMI 37.61 kg/m  , BMI Body mass index is 37.61 kg/m. GENERAL:  Well appearing GEN:  No distress NECK:  No jugular venous distention at 90 degrees, waveform within normal limits, carotid upstroke brisk and symmetric, no bruits, no thyromegaly LYMPHATICS:  No cervical adenopathy LUNGS:  Clear to auscultation bilaterally BACK:  No CVA tenderness CHEST:  Unremarkable HEART:  S1 and S2 within normal limits, no S3, no S4, no clicks, no rubs, 2  out of 6 brief apical systolic murmur radiating slightly up the aortic outflow tract, no diastolic murmurs ABD:  Positive bowel sounds normal in frequency in pitch, no bruits, no rebound, no guarding, unable to assess midline mass or bruit with the patient seated. EXT:  2 plus pulses throughout, moderate right greater than left edema, no cyanosis no clubbing SKIN:  No rashes no nodules NEURO:  Cranial nerves II through XII grossly intact, motor grossly intact throughout PSYCH:  Cognitively intact, oriented to person place and time    EKG:  EKG is ordered today. The ekg ordered today demonstrates sinus bradycardia, rate 55, first-degree AV block, poor anterior R wave progression, left axis deviation, no acute ST-T wave changes.   Recent Labs: 06/06/2021: ALT 3; Hemoglobin 11.2; Platelets 381 07/22/2021: BUN 25; Creat 1.27; Potassium 4.1; Sodium 139    Lipid Panel    Component Value Date/Time   CHOL 178 06/06/2021 1505   CHOL 159 06/21/2013 1507   TRIG 111 06/06/2021 1505   TRIG 91 09/22/2013 1528   TRIG 83 06/21/2013 1507   HDL 59 06/06/2021 1505   HDL 56 09/22/2013 1528   HDL 51 06/21/2013 1507   CHOLHDL 3.0 06/06/2021 1505   CHOLHDL 2.3 02/29/2016 0224   VLDL 7 02/29/2016 0224   LDLCALC 99 06/06/2021 1505   LDLCALC 103 (H) 09/22/2013 1528   LDLCALC 91 06/21/2013 1507      Wt Readings from Last 3 Encounters:  09/25/21 233 lb (105.7 kg)  07/22/21 213 lb (96.6 kg)  04/05/21 213 lb (96.6 kg)      Other studies Reviewed: Additional studies/ records that were reviewed today include: Labs. Review of the above records demonstrates:  Please see elsewhere in the note.     ASSESSMENT AND PLAN:  Murmur: I think this probably represents aortic sclerosis or perhaps mild stenosis and I will follow-up with an echocardiogram and then we will decide on the frequency of return visits or further imaging.  Hypertension: Blood pressures well controlled.  No change in  therapy.  Hyperlipidemia: LDL was 99 with an HDL of 59.  No change in therapy.  Current medicines are reviewed at length with the patient today.  The patient does not have concerns regarding medicines.  The following changes have been made:  no change  Labs/ tests ordered today include:   Orders Placed This Encounter  Procedures   EKG 12-Lead   ECHOCARDIOGRAM COMPLETE      Disposition:   FU with me based on the results of follow-up.   Signed, Minus Breeding, MD  09/25/2021 3:05 PM    [facility]

## 2021-09-25 ENCOUNTER — Other Ambulatory Visit: Payer: Self-pay

## 2021-09-25 ENCOUNTER — Ambulatory Visit (INDEPENDENT_AMBULATORY_CARE_PROVIDER_SITE_OTHER): Payer: Medicare HMO | Admitting: Cardiology

## 2021-09-25 ENCOUNTER — Encounter: Payer: Self-pay | Admitting: Cardiology

## 2021-09-25 VITALS — BP 120/70 | HR 55 | Ht 66.0 in | Wt 233.0 lb

## 2021-09-25 DIAGNOSIS — I10 Essential (primary) hypertension: Secondary | ICD-10-CM | POA: Diagnosis not present

## 2021-09-25 DIAGNOSIS — R011 Cardiac murmur, unspecified: Secondary | ICD-10-CM

## 2021-09-25 DIAGNOSIS — E785 Hyperlipidemia, unspecified: Secondary | ICD-10-CM | POA: Diagnosis not present

## 2021-09-25 NOTE — Patient Instructions (Signed)
Medication Instructions:  The current medical regimen is effective;  continue present plan and medications.  *If you need a refill on your cardiac medications before your next appointment, please call your pharmacy*  Testing/Procedures: Your physician has requested that you have an echocardiogram. Echocardiography is a painless test that uses sound waves to create images of your heart. It provides your doctor with information about the size and shape of your heart and how well your heart's chambers and valves are working. This procedure takes approximately one hour. There are no restrictions for this procedure. This testing will be completed at [facility] and you will be contacted to be scheduled.  Follow-Up: At [facility], you and your health needs are our priority.  As part of our continuing mission to provide you with exceptional heart care, we have created designated Provider Care Teams.  These Care Teams include your primary Cardiologist (physician) and Advanced Practice Providers (APPs -  Physician Assistants and Nurse Practitioners) who all work together to provide you with the care you need, when you need it.  We recommend signing up for the patient portal called "MyChart".  Sign up information is provided on this After Visit Summary.  MyChart is used to connect with patients for Virtual Visits (Telemedicine).  Patients are able to view lab/test results, encounter notes, upcoming appointments, etc.  Non-urgent messages can be sent to your provider as well.   To learn more about what you can do with MyChart, go to [email or web address].    Your next appointment:   Follow up will be based on the results of the above testing.  Thank you for choosing [facility]!!

## 2021-10-21 ENCOUNTER — Ambulatory Visit (HOSPITAL_COMMUNITY): Payer: Medicare HMO

## 2021-11-06 ENCOUNTER — Telehealth: Payer: Self-pay | Admitting: Cardiology

## 2021-11-06 NOTE — Telephone Encounter (Signed)
A user error has taken place: encounter opened in error, closed for administrative reasons.

## 2021-11-27 ENCOUNTER — Other Ambulatory Visit: Payer: Self-pay | Admitting: Family Medicine

## 2021-11-27 NOTE — Telephone Encounter (Signed)
Stacks. NTBS for 6 mos ckup. Mail order not sent

## 2021-11-28 MED ORDER — METFORMIN HCL ER 500 MG PO TB24: 500.0000 mg | ORAL_TABLET | Freq: Every day | ORAL | 0 refills | Status: DC

## 2021-11-28 MED ORDER — DILTIAZEM HCL ER COATED BEADS 240 MG PO CP24
240.0000 mg | ORAL_CAPSULE | Freq: Every day | ORAL | 0 refills | Status: DC
Start: 2021-11-28 — End: 2021-12-12

## 2021-11-28 NOTE — Telephone Encounter (Signed)
Pt scheduled apt 12/12/2021 [facility]

## 2021-11-28 NOTE — Addendum Note (Signed)
Addended by: Antonietta Barcelona D on: 11/28/2021 09:25 AM   Modules accepted: Orders

## 2021-11-29 ENCOUNTER — Ambulatory Visit (HOSPITAL_COMMUNITY): Payer: Medicare HMO

## 2021-12-11 ENCOUNTER — Ambulatory Visit (INDEPENDENT_AMBULATORY_CARE_PROVIDER_SITE_OTHER): Payer: Medicare HMO

## 2021-12-11 VITALS — Ht 66.0 in | Wt 230.0 lb

## 2021-12-11 DIAGNOSIS — Z748 Other problems related to care provider dependency: Secondary | ICD-10-CM

## 2021-12-11 DIAGNOSIS — Z0000 Encounter for general adult medical examination without abnormal findings: Secondary | ICD-10-CM | POA: Diagnosis not present

## 2021-12-11 NOTE — Patient Instructions (Signed)
Beth [name] , Thank you for taking time to come for your Medicare Wellness Visit. I appreciate your ongoing commitment to your health goals. Please review the following plan we discussed and let me know if I can assist you in the future.   Screening recommendations/referrals: Colonoscopy: done 06/21/2014 - no repeat Mammogram: Done 09/25/2017 - declined repeat Bone Density: Done 10/10/2015 - Repeat in 5 years -declined Recommended yearly ophthalmology/optometry visit for glaucoma screening and checkup Recommended yearly dental visit for hygiene and checkup  Vaccinations: Influenza vaccine: Done 10/30/2021 -Repeat annually Pneumococcal vaccine: Due Tdap vaccine: Done 11/03/2006 - Repeat in 10 years *due Shingles vaccine: Due   Covid-19:Done 01/12/2020, 02/13/2020, & 08/08/2021  Advanced directives: Advance directive discussed with you today. I have provided a copy for you to complete at home and have notarized. Once this is complete please bring a copy in to our office so we can scan it into your chart.   Conditions/risks identified: Aim for 30 minutes of exercise each day, drink 6-8 glasses of water and eat lots of fruits and vegetables.   Next appointment: Follow up in one year for your annual wellness visit    Preventive Care 65 Years and Older, Female Preventive care refers to lifestyle choices and visits with your health care provider that can promote health and wellness. What does preventive care include? A yearly physical exam. This is also called an annual well check. Dental exams once or twice a year. Routine eye exams. Ask your health care provider how often you should have your eyes checked. Personal lifestyle choices, including: Daily care of your teeth and gums. Regular physical activity. Eating a healthy diet. Avoiding tobacco and drug use. Limiting alcohol use. Practicing safe sex. Taking low-dose aspirin every day. Taking vitamin and mineral supplements as recommended by  your health care provider. What happens during an annual well check? The services and screenings done by your health care provider during your annual well check will depend on your age, overall health, lifestyle risk factors, and family history of disease. Counseling  Your health care provider may ask you questions about your: Alcohol use. Tobacco use. Drug use. Emotional well-being. Home and relationship well-being. Sexual activity. Eating habits. History of falls. Memory and ability to understand (cognition). Work and work Statistician. Reproductive health. Screening  You may have the following tests or measurements: Height, weight, and BMI. Blood pressure. Lipid and cholesterol levels. These may be checked every 5 years, or more frequently if you are over 78 years old. Skin check. Lung cancer screening. You may have this screening every year starting at age 78 if you have a 30-pack-year history of smoking and currently smoke or have quit within the past 15 years. Fecal occult blood test (FOBT) of the stool. You may have this test every year starting at age 78. Flexible sigmoidoscopy or colonoscopy. You may have a sigmoidoscopy every 5 years or a colonoscopy every 10 years starting at age 78. Hepatitis C blood test. Hepatitis B blood test. Sexually transmitted disease (STD) testing. Diabetes screening. This is done by checking your blood sugar (glucose) after you have not eaten for a while (fasting). You may have this done every 1-3 years. Bone density scan. This is done to screen for osteoporosis. You may have this done starting at age 52. Mammogram. This may be done every 1-2 years. Talk to your health care provider about how often you should have regular mammograms. Talk with your health care provider about your test results, treatment  options, and if necessary, the need for more tests. Vaccines  Your health care provider may recommend certain vaccines, such as: Influenza  vaccine. This is recommended every year. Tetanus, diphtheria, and acellular pertussis (Tdap, Td) vaccine. You may need a Td booster every 10 years. Zoster vaccine. You may need this after age 48. Pneumococcal 13-valent conjugate (PCV13) vaccine. One dose is recommended after age 65. Pneumococcal polysaccharide (PPSV23) vaccine. One dose is recommended after age 65. Talk to your health care provider about which screenings and vaccines you need and how often you need them. This information is not intended to replace advice given to you by your health care provider. Make sure you discuss any questions you have with your health care provider. Document Released: 12/21/2015 Document Revised: 08/13/2016 Document Reviewed: 09/25/2015 Elsevier Interactive Patient Education  2017 [place] Prevention in the Home Falls can cause injuries. They can happen to people of all ages. There are many things you can do to make your home safe and to help prevent falls. What can I do on the outside of my home? Regularly fix the edges of walkways and driveways and fix any cracks. Remove anything that might make you trip as you walk through a door, such as a raised step or threshold. Trim any bushes or trees on the path to your home. Use bright outdoor lighting. Clear any walking paths of anything that might make someone trip, such as rocks or tools. Regularly check to see if handrails are loose or broken. Make sure that both sides of any steps have handrails. Any raised decks and porches should have guardrails on the edges. Have any leaves, snow, or ice cleared regularly. Use sand or salt on walking paths during winter. Clean up any spills in your garage right away. This includes oil or grease spills. What can I do in the bathroom? Use night lights. Install grab bars by the toilet and in the tub and shower. Do not use towel bars as grab bars. Use non-skid mats or decals in the tub or shower. If you  need to sit down in the shower, use a plastic, non-slip stool. Keep the floor dry. Clean up any water that spills on the floor as soon as it happens. Remove soap buildup in the tub or shower regularly. Attach bath mats securely with double-sided non-slip rug tape. Do not have throw rugs and other things on the floor that can make you trip. What can I do in the bedroom? Use night lights. Make sure that you have a light by your bed that is easy to reach. Do not use any sheets or blankets that are too big for your bed. They should not hang down onto the floor. Have a firm chair that has side arms. You can use this for support while you get dressed. Do not have throw rugs and other things on the floor that can make you trip. What can I do in the kitchen? Clean up any spills right away. Avoid walking on wet floors. Keep items that you use a lot in easy-to-reach places. If you need to reach something above you, use a strong step stool that has a grab bar. Keep electrical cords out of the way. Do not use floor polish or wax that makes floors slippery. If you must use wax, use non-skid floor wax. Do not have throw rugs and other things on the floor that can make you trip. What can I do with my stairs? Do not  leave any items on the stairs. Make sure that there are handrails on both sides of the stairs and use them. Fix handrails that are broken or loose. Make sure that handrails are as long as the stairways. Check any carpeting to make sure that it is firmly attached to the stairs. Fix any carpet that is loose or worn. Avoid having throw rugs at the top or bottom of the stairs. If you do have throw rugs, attach them to the floor with carpet tape. Make sure that you have a light switch at the top of the stairs and the bottom of the stairs. If you do not have them, ask someone to add them for you. What else can I do to help prevent falls? Wear shoes that: Do not have high heels. Have rubber  bottoms. Are comfortable and fit you well. Are closed at the toe. Do not wear sandals. If you use a stepladder: Make sure that it is fully opened. Do not climb a closed stepladder. Make sure that both sides of the stepladder are locked into place. Ask someone to hold it for you, if possible. Clearly mark and make sure that you can see: Any grab bars or handrails. First and last steps. Where the edge of each step is. Use tools that help you move around (mobility aids) if they are needed. These include: Canes. Walkers. Scooters. Crutches. Turn on the lights when you go into a dark area. Replace any light bulbs as soon as they burn out. Set up your furniture so you have a clear path. Avoid moving your furniture around. If any of your floors are uneven, fix them. If there are any pets around you, be aware of where they are. Review your medicines with your doctor. Some medicines can make you feel dizzy. This can increase your chance of falling. Ask your doctor what other things that you can do to help prevent falls. This information is not intended to replace advice given to you by your health care provider. Make sure you discuss any questions you have with your health care provider. Document Released: 09/20/2009 Document Revised: 05/01/2016 Document Reviewed: 12/29/2014 Elsevier Interactive Patient Education  2017 [employer].

## 2021-12-11 NOTE — Progress Notes (Signed)
Subjective:   ALITHIA ZAVALETA is a 76 y.o. female who presents for Medicare Annual (Subsequent) preventive examination.  Virtual Visit via Telephone Note  I connected with  [name] on 12/11/21 at  2:00 PM EST by telephone and verified that I am speaking with the correct person using two identifiers.  Location: Patient: Home Provider: [facility] Persons participating in the virtual visit: patient/Nurse Health Advisor   I discussed the limitations, risks, security and privacy concerns of performing an evaluation and management service by telephone and the availability of in person appointments. The patient expressed understanding and agreed to proceed.  Interactive audio and video telecommunications were attempted between this nurse and patient, however failed, due to patient having technical difficulties OR patient did not have access to video capability.  We continued and completed visit with audio only.  Some vital signs may be absent or patient reported.   Malloree Raboin E Anayah Arvanitis, LPN   Review of Systems     Cardiac Risk Factors include: advanced age (>74mn, >>56women);obesity (BMI >30kg/m2);sedentary lifestyle;diabetes mellitus;dyslipidemia;hypertension     Objective:    Today's Vitals   12/11/21 1411  Weight: 230 lb (104.3 kg)  Height: 5' 6"  (1.676 m)   Body mass index is 37.12 kg/m.  Advanced Directives 12/11/2021 08/27/2020 08/21/2020 04/02/2020 12/11/2019 12/03/2018 12/03/2018  Does Patient Have a Medical Advance Directive? No Yes Yes No No Yes Yes  Type of Advance Directive - Healthcare Power of ALa Barge Does patient want to make changes to medical advance directive? - No - Patient declined - - - No - Patient declined No - Patient declined  [place] of [place]? - - - - - No - copy requested No - copy requested  Would patient like information on creating a medical  advance directive? No - Patient declined - - No - Patient declined No - Patient declined - -  Pre-existing out of facility DNR order (yellow form or pink MOST form) - - - - - - -    Current Medications (verified) Outpatient Encounter Medications as of 12/11/2021  Medication Sig   amLODipine (NORVASC) 10 MG tablet Take 1 tablet (10 mg total) by mouth every evening.   atorvastatin (LIPITOR) 20 MG tablet Take 1 tablet (20 mg total) by mouth every evening.   Blood Glucose Calibration (TRUE METRIX LEVEL 1) Low SOLN Use with glucose monitor Dx E11.9   Blood Glucose Monitoring Suppl (TRUE METRIX AIR GLUCOSE METER) w/Device KIT 1 Device by Does not apply route 4 (four) times daily. Dx E11.9   diltiazem (CARDIZEM CD) 240 MG 24 hr capsule Take 1 capsule (240 mg total) by mouth daily.   glucose blood (TRUE METRIX BLOOD GLUCOSE TEST) test strip check glucose up to four times daily Dx E11.9   hydrALAZINE (APRESOLINE) 100 MG tablet Take 1 tablet (100 mg total) by mouth 3 (three) times daily.   levofloxacin (LEVAQUIN) 500 MG tablet Take 500 mg by mouth daily.   metFORMIN (GLUCOPHAGE-XR) 500 MG 24 hr tablet Take 1 tablet (500 mg total) by mouth daily.   Nebivolol HCl 20 MG TABS TAKE 1 TABLET EVERY DAY   olmesartan (BENICAR) 40 MG tablet TAKE ONE (1) TABLET EACH DAY   Omega-3 Fatty Acids (FISH OIL) 1000 MG CAPS Take 1,000 mg by mouth. Take one tablet by mouth 2 to 3 times weekly   TRUEplus Lancets 33G MISC  Test QID Dx E11.9   acetaminophen (TYLENOL) 500 MG tablet Take 2 tablets (1,000 mg total) by mouth every 6 (six) hours as needed for mild pain. (Patient not taking: Reported on 09/25/2021)   ferrous sulfate 325 (65 FE) MG tablet Take 325 mg by mouth daily. (Patient not taking: Reported on 12/11/2021)   isosorbide dinitrate (ISORDIL) 10 MG tablet Take 1 tablet (10 mg total) by mouth 3 (three) times daily. (Patient not taking: Reported on 12/11/2021)   ketoconazole (NIZORAL) 2 % cream SMARTSIG:1 Application Topical  1 to 2 Times Daily (Patient not taking: Reported on 12/11/2021)   methocarbamol (ROBAXIN) 500 MG tablet Take 1 tablet (500 mg total) by mouth every 6 (six) hours as needed for muscle spasms. (Patient not taking: Reported on 12/11/2021)   pregabalin (LYRICA) 50 MG capsule 1 qhs X7 days , then 2 qhs X 7d, then 3 qhs X 7d, then 4 qhs (Patient not taking: Reported on 09/25/2021)   traMADol (ULTRAM) 50 MG tablet Take 1-2 tablets (50-100 mg total) by mouth every 6 (six) hours as needed for moderate pain. (Patient not taking: Reported on 12/11/2021)   Facility-Administered Encounter Medications as of 12/11/2021  Medication   tranexamic acid (CYKLOKAPRON) 2,000 mg in sodium chloride 0.9 % 50 mL Topical Application    Allergies (verified) Aleve [naproxen sodium], Asa [aspirin], Ciprofloxacin, Clonidine derivatives, and Shellfish allergy   History: Past Medical History:  Diagnosis Date   Anemia    Arthritis    Knee both knees   Blood transfusion without reported diagnosis 2012   anemia;pt denies transfusion stated was only on iron tablet   Cataract    left   CKD (chronic kidney disease), stage III ([place])    Diabetes mellitus without complication ([place])    Family history of anesthesia complication    sister very slow to awaken after anesthesia;severe vomiting    Gout    left elbow   Herpes infection 08/09/2014   Saw doctor Wed. 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   Other acute osteomyelitis, right femur ([place]) 07/23/2020   Pseudomonas aeruginosa infection 12/15/2018   Stroke ([place]) 2006   x 1 no deficits noted    Past Surgical History:  Procedure Laterality Date   [place] Right 1983   colonscopy  June 21, 2014   EXCISIONAL TOTAL KNEE ARTHROPLASTY WITH ANTIBIOTIC SPACERS Right 02/16/2015    Procedure: RIGHT KNEE RESECTION ARTHROPLASTY WITH ANTIBIOTIC SPACERS;  Surgeon: [name], MD;  Location: [facility];  Service: Orthopedics;  Laterality: Right;   EXCISIONAL TOTAL KNEE ARTHROPLASTY WITH ANTIBIOTIC SPACERS Right 11/03/2018   Procedure: Right knee resection arthroplasty; antibiotic spacer;  Surgeon: [name], MD;  Location: [facility];  Service: Orthopedics;  Laterality: Right;  Adductor Block   I & D KNEE WITH POLY EXCHANGE Right 10/02/2014   Procedure: IRRIGATION AND DEBRIDEMENT RIGHT KNEE WITH POLY EXCHANGE;  Surgeon: Gearlean Alf, MD;  Location: [facility];  Service: Orthopedics;  Laterality: Right;   I & D KNEE WITH POLY EXCHANGE Right 08/27/2020   Procedure: IRRIGATION AND DEBRIDEMENT; SPACER EXCHANGE RIGHT KNEE with multiple specimens;  Surgeon: [name], MD;  Location: [facility];  Service: Orthopedics;  Laterality: Right;  72mn   INCISION AND DRAINAGE OF WOUND Right 01/14/2017   Procedure:  IRRIGATION AND DEBRIDEMENT WOUND;  Surgeon: [name], MD;  Location: [facility];  Service: Orthopedics;  Laterality: Right;  requests 37mns   JOINT REPLACEMENT  06/2014   right knee   nasal cauterization  2012   PATELLAR TENDON REPAIR Right 08/11/2014   Procedure: RIGHT PATELLA TENDON REPAIR;  Surgeon: FGearlean Alf MD;  [facility]: [facility];  Service: Orthopedics;  Laterality: Right;   REIMPLANTATION OF TOTAL KNEE Right 05/23/2015   Procedure: RIGHT KNEE ARTHROPLASTY REIMPLANTATION;  Surgeon: FGaynelle Arabian MD;  [facility];  Service: Orthopedics;  Laterality: Right;   TOTAL KNEE ARTHROPLASTY Right 07/03/2014   Procedure: RIGHT TOTAL KNEE ARTHROPLASTY;  Surgeon: FGearlean Alf MD;  [facility]: [facility];  Service: Orthopedics;  Laterality: Right;   TUBAL LIGATION     Family History  Problem Relation Age of Onset   Ovarian cancer Mother    Cancer Mother    Peripheral vascular disease Father        with amputation of both legs   Hypertension Father    Heart disease Brother 534   Kidney disease Daughter    Heart disease Daughter 431  Diabetes Son    Diabetes Son    Colon cancer Neg Hx    Esophageal cancer Neg Hx    Stomach cancer Neg Hx    Rectal cancer Neg Hx    Social History   Socioeconomic History   Marital status: Widowed    Spouse name: Not on file   Number of children: 9   Years of education: 8th   Highest education level: 8th grade  Occupational History    Employer: RETIRED  Tobacco Use   Smoking status: Never   Smokeless tobacco: Never  Vaping Use   Vaping Use: Never used  Substance and Sexual Activity   Alcohol use: No   Drug use: No   Sexual activity: Not Currently  Other Topics Concern   Not on file  Social History Narrative   She has 6 living adult children and 3 who have passed away and many grandchildren and great grandchildren. Patient is retired. She worked part time cEducation administratorhouses and a dTheatre manager    [place]   Social Determinants of Health   Financial Resource Strain: Not on fComcastInsecurity: Not on file  Transportation Needs: Not on file  Physical Activity: Insufficiently Active   Days of Exercise per Week: 7 days   Minutes of Exercise per Session: 20 min  Stress: No Stress Concern Present   Feeling of Stress : Only a little  Social Connections: Moderately Isolated   Frequency of Communication with Friends and Family: More than three times a week   Frequency of Social Gatherings with Friends and Family: More than three times a week   Attends Religious Services: 1 to 4 times per year   Active Member of [employer] Organizations: No   Attends CMusic therapist Never   Marital Status: Divorced    Tobacco Counseling Counseling given: Not Answered   Clinical Intake:  Pre-visit preparation completed: No  Pain : No/denies pain     BMI - recorded: 37.12 Nutritional Status: BMI > 30  Obese Nutritional Risks: None Diabetes: No  How often do  you need to have someone help you when you read instructions, pamphlets, or other written materials from your doctor or pharmacy?: 1 - Never  Diabetic? Nutrition Risk Assessment:  Has the patient  had any N/V/D within the last 2 months?  No  Does the patient have any non-healing wounds?  Yes  Has the patient had any unintentional weight loss or weight gain?  No   Diabetes:  Is the patient diabetic?  Yes  If diabetic, was a CBG obtained today?  No  Did the patient bring in their glucometer from home?  No  How often do you monitor your CBG's? Once per day.   Financial Strains and Diabetes Management:  Are you having any financial strains with the device, your supplies or your medication? No .  Does the patient want to be seen by Chronic Care Management for management of their diabetes?  No  Would the patient like to be referred to a Nutritionist or for Diabetic Management?  No   Diabetic Exams:  Diabetic Eye Exam: Completed 2019.   Diabetic Foot Exam: Completed 03/29/2020. Pt has been advised about the importance in completing this exam. Pt is scheduled for diabetic foot exam on 12/12/2021.    Interpreter Needed?: No  Information entered by :: Elexis Pollak, LPN   Activities of Daily Living In your present state of health, do you have any difficulty performing the following activities: 12/11/2021  Hearing? N  Vision? N  Difficulty concentrating or making decisions? N  Walking or climbing stairs? Y  Dressing or bathing? Y  Doing errands, shopping? Y  Preparing Food and eating ? Y  Using the Toilet? N  In the past six months, have you accidently leaked urine? N  Do you have problems with loss of bowel control? N  Managing your Medications? N  Managing your Finances? Y  Housekeeping or managing your Housekeeping? Y  Some recent data might be hidden    Patient Care Team: Claretta Fraise, MD as PCP - General (Family Medicine) Claretta Fraise, MD (Family Medicine) [name],  MD as Consulting Physician (Orthopedic Surgery) Ilean China, RN as Case Manager  Indicate any recent Medical Services you may have received from other than [facility] providers in the past year (date may be approximate).     Assessment:   This is a routine wellness examination for Gordon.  Hearing/Vision screen Hearing Screening - Comments:: Denies hearing difficulties  Vision Screening - Comments:: Wears rx glasses - up to date with annual [facility] exams with [facility]  Dietary issues and exercise activities discussed: Current Exercise Habits: Home exercise routine, Type of exercise: stretching, Time (Minutes): 20, Frequency (Times/Week): 7, Weekly Exercise (Minutes/Week): 140, Intensity: Mild, Exercise limited by: orthopedic condition(s);neurologic condition(s)   Goals Addressed             This Visit's Progress    AWV   On track    04/02/2020 AWV Goal: Fall Prevention  Over the next year, patient will decrease their risk for falls by: Using assistive devices, such as a cane or walker, as needed Identifying fall risks within their home and correcting them by: Removing throw rugs Adding handrails to stairs or ramps Removing clutter and keeping a clear pathway throughout the home Increasing light, especially at night Adding shower handles/bars Raising toilet seat Identifying potential personal risk factors for falls: Medication side effects Incontinence/urgency Vestibular dysfunction Hearing loss Musculoskeletal disorders Neurological disorders Orthostatic hypotension  04/02/2020 AWV Goal: Diabetes Management  Patient will maintain an A1C level below 8.0 Patient will not develop any diabetic foot complications Patient will not experience any hypoglycemic episodes over the next 3 months Patient will notify our office of any CBG readings  outside of the provider recommended range by calling [phone] Patient will adhere to provider recommendations for diabetes  management  Patient Self Management Activities take all medications as prescribed and report any negative side effects monitor and record blood sugar readings as directed adhere to a low carbohydrate diet that incorporates lean proteins, vegetables, whole grains, low glycemic fruits check feet daily noting any sores, cracks, injuries, or callous formations see PCP or podiatrist if she notices any changes in her legs, feet, or toenails Patient will visit PCP and have an A1C level checked every 3 to 6 months as directed  have a yearly eye exam to monitor for vascular changes associated with diabetes and will request that the report be sent to her pcp.  consult with her PCP regarding any changes in her health or new or worsening symptoms        Depression Screen PHQ 2/9 Scores 12/11/2021 06/06/2021 06/06/2021 01/08/2021 08/16/2020 07/23/2020 04/02/2020  PHQ - 2 Score 1 1 0 0 0 0 0  PHQ- 9 Score - 2 - - - - -    Fall Risk Fall Risk  12/11/2021 07/22/2021 06/06/2021 01/08/2021 01/08/2021  Falls in the past year? 0 0 0 0 0  Comment - - - - -  Number falls in past yr: 0 0 - - -  Comment - - - - -  Injury with Fall? 0 0 - - -  Risk Factor Category  - - - - -  Risk for fall due to : Impaired mobility;Orthopedic patient - - - No Fall Risks  Follow up Education provided;Falls prevention discussed - - - Falls evaluation completed    FALL RISK PREVENTION PERTAINING TO THE HOME:  Any stairs in or around the home? No  If so, are there any without handrails? No  Home free of loose throw rugs in walkways, pet beds, electrical cords, etc? Yes  Adequate lighting in your home to reduce risk of falls? Yes   ASSISTIVE DEVICES UTILIZED TO PREVENT FALLS:  Life alert? Yes  Use of a cane, walker or w/c? Yes  Grab bars in the bathroom? Yes  Shower chair or bench in shower? Yes  Elevated toilet seat or a handicapped toilet? Yes   TIMED UP AND GO:  Was the test performed? No . Telephonic visit  Cognitive  Function: Normal cognitive status assessed by direct observation by this Nurse Health Advisor. No abnormalities found.    MMSE - Mini Mental State Exam 12/30/2017 12/16/2016 08/10/2015  Orientation to time 5 5 5   Orientation to Place 5 5 5   Registration 3 3 3   Attention/ Calculation 5 5 5   Recall 3 3 3   Language- name 2 objects 2 2 2   Language- repeat 1 1 1   Language- follow 3 step command 3 3 3   Language- read & follow direction 1 1 1   Write a sentence 1 1 1   Copy design 1 0 1  Total score 30 29 30      6CIT Screen 04/02/2020  What Year? 0 points  What month? 0 points  What time? 0 points  Count back from 20 0 points  Months in reverse 2 points  Repeat phrase 0 points  Total Score 2    Immunizations Immunization History  Administered Date(s) Administered   Fluad Quad(high Dose 65+) 10/30/2020   Influenza, High Dose Seasonal PF 08/27/2016, 09/28/2018, 09/29/2019   Influenza,inj,Quad PF,6+ Mos 11/19/2015   Influenza-Unspecified 10/23/2021   Moderna Sars-Covid-2 Vaccination 01/12/2020, 02/13/2020   Tdap  11/03/2006    TDAP status: Due, Education has been provided regarding the importance of this vaccine. Advised may receive this vaccine at local pharmacy or Health Dept. Aware to provide a copy of the vaccination record if obtained from local pharmacy or Health Dept. Verbalized acceptance and understanding.  Flu Vaccine status: Up to date  Pneumococcal vaccine status: Due, Education has been provided regarding the importance of this vaccine. Advised may receive this vaccine at local pharmacy or Health Dept. Aware to provide a copy of the vaccination record if obtained from local pharmacy or Health Dept. Verbalized acceptance and understanding.  Covid-19 vaccine status: Completed vaccines  Qualifies for Shingles Vaccine? Yes   Zostavax completed No   Shingrix Completed?: No.    Education has been provided regarding the importance of this vaccine. Patient has been advised to call  insurance company to determine out of pocket expense if they have not yet received this vaccine. Advised may also receive vaccine at local pharmacy or Health Dept. Verbalized acceptance and understanding.  Screening Tests Health Maintenance  Topic Date Due   Pneumonia Vaccine 75+ Years old (1 - PCV) Never done   Zoster Vaccines- Shingrix (1 of 2) Never done   TETANUS/TDAP  11/03/2016   OPHTHALMOLOGY EXAM  04/16/2019   COVID-19 Vaccine (3 - Booster for Moderna series) 04/09/2020   DEXA SCAN  10/09/2020   FOOT EXAM  03/29/2021   HEMOGLOBIN A1C  12/06/2021   INFLUENZA VACCINE  Completed   Hepatitis C Screening  Completed   HPV VACCINES  Aged Out   COLONOSCOPY (Pts 45-62yr Insurance coverage will need to be confirmed)  Discontinued    Health Maintenance  Health Maintenance Due  Topic Date Due   Pneumonia Vaccine 78 Years old (1 - PCV) Never done   Zoster Vaccines- Shingrix (1 of 2) Never done   TETANUS/TDAP  11/03/2016   OPHTHALMOLOGY EXAM  04/16/2019   COVID-19 Vaccine (3 - Booster for Moderna series) 04/09/2020   DEXA SCAN  10/09/2020   FOOT EXAM  03/29/2021   HEMOGLOBIN A1C  12/06/2021    Colorectal cancer screening: No longer required.   Mammogram status: No longer required due to declined.  Bone Density status: Completed 10/10/2015. Results reflect: Bone density results: NORMAL. Repeat every 5 years. Unable to get on table - declind  Lung Cancer Screening: (Low Dose CT Chest recommended if Age 78-80years, 30 pack-year currently smoking OR have quit w/in 15years.) does not qualify.   Additional Screening:  Hepatitis C Screening: does qualify; Completed 11/06/2018  Vision Screening: Recommended annual ophthalmology exams for early detection of glaucoma and other disorders of the eye. Is the patient up to date with their annual eye exam?  No  Who is the provider or what is the name of the office in which the patient attends annual eye exams? [place]  not established with a provider, would they like to be referred to a provider to establish care? No .   Dental Screening: Recommended annual dental exams for proper oral hygiene  Community Resource Referral / Chronic Care Management: CRR required this visit?  Yes   CCM required this visit?  No      Plan:     I have personally reviewed and noted the following in the patients chart:   Medical and social history Use of alcohol, tobacco or illicit drugs  Current medications and supplements including opioid prescriptions.  Functional ability and status Nutritional status Physical activity Advanced directives  List of other physicians Hospitalizations, surgeries, and ER visits in previous 12 months Vitals Screenings to include cognitive, depression, and falls Referrals and appointments  In addition, I have reviewed and discussed with patient certain preventive protocols, quality metrics, and best practice recommendations. A written personalized care plan for preventive services as well as general preventive health recommendations were provided to patient.     Sandrea Hammond, LPN   5/0/0370   Nurse Notes: CRR for transportation assistance

## 2021-12-12 ENCOUNTER — Encounter: Payer: Self-pay | Admitting: Family Medicine

## 2021-12-12 ENCOUNTER — Ambulatory Visit (INDEPENDENT_AMBULATORY_CARE_PROVIDER_SITE_OTHER): Payer: Medicare HMO | Admitting: Family Medicine

## 2021-12-12 VITALS — BP 122/57 | HR 54 | Temp 98.2°F | Ht 66.0 in

## 2021-12-12 DIAGNOSIS — I10 Essential (primary) hypertension: Secondary | ICD-10-CM

## 2021-12-12 DIAGNOSIS — E782 Mixed hyperlipidemia: Secondary | ICD-10-CM | POA: Diagnosis not present

## 2021-12-12 DIAGNOSIS — Z23 Encounter for immunization: Secondary | ICD-10-CM

## 2021-12-12 DIAGNOSIS — E119 Type 2 diabetes mellitus without complications: Secondary | ICD-10-CM

## 2021-12-12 LAB — BAYER DCA HB A1C WAIVED: HB A1C (BAYER DCA - WAIVED): 5.3 % (ref 4.8–5.6)

## 2021-12-12 MED ORDER — DILTIAZEM HCL ER COATED BEADS 240 MG PO CP24: 240.0000 mg | ORAL_CAPSULE | Freq: Every day | ORAL | 3 refills | Status: DC

## 2021-12-12 MED ORDER — AMLODIPINE BESYLATE 10 MG PO TABS: 10.0000 mg | ORAL_TABLET | Freq: Every day | ORAL | 3 refills | Status: DC

## 2021-12-12 MED ORDER — METFORMIN HCL ER 500 MG PO TB24: 500.0000 mg | ORAL_TABLET | Freq: Every day | ORAL | 3 refills | Status: DC

## 2021-12-13 LAB — CBC WITH DIFFERENTIAL/PLATELET
Basophils Absolute: 0.1 10*3/uL (ref 0.0–0.2)
Basos: 1 %
EOS (ABSOLUTE): 0.5 10*3/uL — ABNORMAL HIGH (ref 0.0–0.4)
Eos: 7 %
Hematocrit: 36.7 % (ref 34.0–46.6)
Hemoglobin: 11.6 g/dL (ref 11.1–15.9)
Immature Grans (Abs): 0 10*3/uL (ref 0.0–0.1)
Immature Granulocytes: 0 %
Lymphocytes Absolute: 1.7 10*3/uL (ref 0.7–3.1)
Lymphs: 24 %
MCH: 25.1 pg — ABNORMAL LOW (ref 26.6–33.0)
MCHC: 31.6 g/dL (ref 31.5–35.7)
MCV: 79 fL (ref 79–97)
Monocytes Absolute: 0.8 10*3/uL (ref 0.1–0.9)
Monocytes: 11 %
Neutrophils Absolute: 4 10*3/uL (ref 1.4–7.0)
Neutrophils: 57 %
Platelets: 334 10*3/uL (ref 150–450)
RBC: 4.62 x10E6/uL (ref 3.77–5.28)
RDW: 15.2 % (ref 11.7–15.4)
WBC: 7 10*3/uL (ref 3.4–10.8)

## 2021-12-13 LAB — LIPID PANEL
Chol/HDL Ratio: 2.9 ratio (ref 0.0–4.4)
Cholesterol, Total: 173 mg/dL (ref 100–199)
HDL: 60 mg/dL (ref >39)
LDL Chol Calc (NIH): 95 mg/dL (ref 0–99)
Triglycerides: 100 mg/dL (ref 0–149)
VLDL Cholesterol Cal: 18 mg/dL (ref 5–40)

## 2021-12-13 LAB — CMP14+EGFR
ALT: 13 IU/L (ref 0–32)
AST: 27 IU/L (ref 0–40)
Albumin/Globulin Ratio: 1.3 (ref 1.2–2.2)
Albumin: 4.3 g/dL (ref 3.7–4.7)
Alkaline Phosphatase: 149 IU/L — ABNORMAL HIGH (ref 44–121)
BUN/Creatinine Ratio: 24 (ref 12–28)
BUN: 37 mg/dL — ABNORMAL HIGH (ref 8–27)
Bilirubin Total: 0.5 mg/dL (ref 0.0–1.2)
CO2: 20 mmol/L (ref 20–29)
Calcium: 9.7 mg/dL (ref 8.7–10.3)
Chloride: 102 mmol/L (ref 96–106)
Creatinine, Ser: 1.52 mg/dL — ABNORMAL HIGH (ref 0.57–1.00)
Globulin, Total: 3.2 g/dL (ref 1.5–4.5)
Glucose: 74 mg/dL (ref 70–99)
Potassium: 4.5 mmol/L (ref 3.5–5.2)
Sodium: 136 mmol/L (ref 134–144)
Total Protein: 7.5 g/dL (ref 6.0–8.5)
eGFR: 35 mL/min/{1.73_m2} — ABNORMAL LOW (ref >59)

## 2021-12-15 ENCOUNTER — Encounter: Payer: Self-pay | Admitting: Family Medicine

## 2021-12-15 NOTE — Progress Notes (Signed)
Subjective:  Patient ID: Beth [name],  female    DOB: 1946/09/05  Age: 76 y.o.    CC: Diabetes and Medical Management of Chronic Issues   HPI Beth [name] presents for  follow-up of hypertension. Patient has no history of headache chest pain or shortness of breath or recent cough. Patient also denies symptoms of TIA such as numbness weakness lateralizing. Patient denies side effects from medication. States taking it regularly.  Patient also  in for follow-up of elevated cholesterol. Doing well without complaints on current medication. Denies side effects  including myalgia and arthralgia and nausea. Also in today for liver function testing. Currently no chest pain, shortness of breath or other cardiovascular related symptoms noted.  Follow-up of diabetes. Patient does not check blood sugar at home. Patient denies symptoms such as excessive hunger or urinary frequency, excessive hunger, nausea No significant hypoglycemic spells noted. Medications reviewed. Pt reports taking them regularly. Pt. denies complication/adverse reaction today.    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   She has a past surgical history that includes Carpal tunnel release (Right, 1983); colonscopy (June 21, 2014); nasal cauterization (2012); Total knee arthroplasty (Right, 07/03/2014); Patellar tendon repair (Right, 08/11/2014); Joint replacement (06/2014); I & D knee with poly exchange  (Right, 10/02/2014); Excisional total knee arthroplasty with antibiotic spacers (Right, 02/16/2015); Reimplantation of total knee (Right, 05/23/2015); Abdominal hysterectomy (1983); Tubal ligation; Incision and drainage of wound (Right, 01/14/2017); Excisional total knee arthroplasty with antibiotic spacers (Right, 11/03/2018); and I & D knee with poly exchange (Right, 08/27/2020).   Her family history includes Cancer in her mother; Diabetes in her son and son; Heart disease (age of onset: 67) in her daughter; Heart disease (age of onset: 23) in her brother; Hypertension in her father; Kidney disease in her daughter; Ovarian cancer in her mother; Peripheral vascular disease in her father.She reports that she has never smoked. She has never used smokeless tobacco. She reports that she does not drink alcohol and does not use drugs.  Current Outpatient Medications on File Prior to Visit  Medication Sig Dispense Refill   acetaminophen (TYLENOL) 500 MG tablet Take 2 tablets (1,000 mg total) by mouth every 6 (six) hours as needed for mild pain. 90 tablet 1   atorvastatin (LIPITOR) 20 MG tablet Take 1 tablet (20 mg total) by mouth every evening. 90 tablet 3   Blood Glucose Calibration (TRUE METRIX LEVEL 1) Low SOLN Use with glucose monitor Dx E11.9 3 each 0   Blood Glucose Monitoring Suppl (TRUE METRIX AIR GLUCOSE METER) w/Device KIT 1 Device by Does not apply route 4 (four) times daily. Dx E11.9 1 kit 0   ferrous sulfate 325 (65 FE) MG tablet Take 325 mg by mouth daily.     glucose blood (TRUE METRIX BLOOD GLUCOSE TEST) test strip check glucose up to four times daily Dx E11.9 400 strip 3   hydrALAZINE (APRESOLINE) 100 MG tablet Take 1 tablet (100 mg total) by mouth 3 (three) times daily. 270 tablet 3   Nebivolol HCl 20 MG TABS TAKE 1 TABLET EVERY DAY 90 tablet 3   olmesartan (BENICAR)  40 MG tablet TAKE ONE (1) TABLET EACH DAY 90 tablet 3   Omega-3 Fatty Acids (FISH OIL) 1000 MG CAPS Take 1,000 mg by mouth. Take  one tablet by mouth 2 to 3 times weekly     TRUEplus Lancets 33G MISC Test QID Dx E11.9 400 each 3   isosorbide dinitrate (ISORDIL) 10 MG tablet Take 1 tablet (10 mg total) by mouth 3 (three) times daily. (Patient not taking: Reported on 12/11/2021) 270 tablet 3   traMADol (ULTRAM) 50 MG tablet Take 1-2 tablets (50-100 mg total) by mouth every 6 (six) hours as needed for moderate pain. (Patient not taking: Reported on 12/11/2021) 40 tablet 0   Current Facility-Administered Medications on File Prior to Visit  Medication Dose Route Frequency Provider Last Rate Last Admin   tranexamic acid (CYKLOKAPRON) 2,000 mg in sodium chloride 0.9 % 50 mL Topical Application  5,409 mg Topical Once [name], MD        ROS Review of Systems  Constitutional: Negative.   HENT: Negative.    Eyes:  Negative for visual disturbance.  Respiratory:  Negative for shortness of breath.   Cardiovascular:  Negative for chest pain.  Gastrointestinal:  Negative for abdominal pain.  Musculoskeletal:  Negative for arthralgias.   Objective:  BP (!) 122/57    Pulse (!) 54    Temp 98.2 F (36.8 C) (Temporal)    Ht 5' 6"  (1.676 m)    BMI 37.12 kg/m   BP Readings from Last 3 Encounters:  12/12/21 (!) 122/57  09/25/21 120/70  07/22/21 (!) 195/97    Wt Readings from Last 3 Encounters:  12/11/21 230 lb (104.3 kg)  09/25/21 233 lb (105.7 kg)  07/22/21 213 lb (96.6 kg)     Physical Exam Constitutional:      General: She is not in acute distress.    Appearance: She is well-developed.  HENT:     Head: Normocephalic and atraumatic.  Eyes:     Conjunctiva/sclera: Conjunctivae normal.     Pupils: Pupils are equal, round, and reactive to light.  Neck:     Thyroid: No thyromegaly.  Cardiovascular:     Rate and Rhythm: Normal rate and regular rhythm.     Heart sounds: Normal heart sounds. No murmur heard. Pulmonary:     Effort: Pulmonary effort is normal. No respiratory distress.     Breath sounds: Normal breath  sounds. No wheezing or rales.  Abdominal:     General: Bowel sounds are normal. There is no distension.     Palpations: Abdomen is soft.     Tenderness: There is no abdominal tenderness.  Musculoskeletal:        General: Normal range of motion.     Cervical back: Normal range of motion and neck supple.  Lymphadenopathy:     Cervical: No cervical adenopathy.  Skin:    General: Skin is warm and dry.  Neurological:     Mental Status: She is alert and oriented to person, place, and time.  Psychiatric:        Behavior: Behavior normal.        Thought Content: Thought content normal.        Judgment: Judgment normal.    Diabetic Foot Exam - Simple   No data filed       Assessment & Plan:   Willodene was seen today for diabetes and medical management of chronic issues.  Diagnoses and all orders for this visit:  Controlled type 2 diabetes mellitus  without complication, without long-term current use of insulin (HCC) -     CBC with Differential/Platelet -     CMP14+EGFR -     Lipid panel -     Bayer DCA Hb A1c Waived  Essential hypertension -     CBC with Differential/Platelet -     CMP14+EGFR  Mixed hyperlipidemia -     CBC with Differential/Platelet -     CMP14+EGFR -     Lipid panel  Need for vaccination -     Pneumococcal conjugate vaccine 20-valent (Prevnar 20)  Other orders -     amLODipine (NORVASC) 10 MG tablet; Take 1 tablet (10 mg total) by mouth daily. -     diltiazem (CARDIZEM CD) 240 MG 24 hr capsule; Take 1 capsule (240 mg total) by mouth daily. -     metFORMIN (GLUCOPHAGE-XR) 500 MG 24 hr tablet; Take 1 tablet (500 mg total) by mouth daily.   I have discontinued Beth [name]'s methocarbamol, amLODipine, pregabalin, ketoconazole, and levofloxacin. I have also changed her amLODipine. Additionally, I am having her maintain her acetaminophen, ferrous sulfate, True Metrix Air Glucose Meter, True Metrix Level 1, TRUEplus Lancets 33G, traMADol, True Metrix Blood  Glucose Test, atorvastatin, hydrALAZINE, isosorbide dinitrate, Nebivolol HCl, olmesartan, Fish Oil, diltiazem, and metFORMIN.  Meds ordered this encounter  Medications   amLODipine (NORVASC) 10 MG tablet    Sig: Take 1 tablet (10 mg total) by mouth daily.    Dispense:  90 tablet    Refill:  3   diltiazem (CARDIZEM CD) 240 MG 24 hr capsule    Sig: Take 1 capsule (240 mg total) by mouth daily.    Dispense:  90 capsule    Refill:  3   metFORMIN (GLUCOPHAGE-XR) 500 MG 24 hr tablet    Sig: Take 1 tablet (500 mg total) by mouth daily.    Dispense:  90 tablet    Refill:  3     Follow-up: Return in about 3 months (around 03/12/2022).  Claretta Fraise, M.D.

## 2021-12-15 NOTE — Progress Notes (Signed)
Hello [place],  Your lab result is normal and/or stable.Some minor variations that are not significant are commonly marked abnormal, but do not represent any medical problem for you.  Best regards, Marlise Fahr, M.D.

## 2021-12-16 DIAGNOSIS — H25813 Combined forms of age-related cataract, bilateral: Secondary | ICD-10-CM | POA: Diagnosis not present

## 2021-12-16 DIAGNOSIS — H5200 Hypermetropia, unspecified eye: Secondary | ICD-10-CM | POA: Diagnosis not present

## 2021-12-16 DIAGNOSIS — Z0100 Encounter for examination of eyes and vision without abnormal findings: Secondary | ICD-10-CM | POA: Diagnosis not present

## 2021-12-16 DIAGNOSIS — H35363 Drusen (degenerative) of macula, bilateral: Secondary | ICD-10-CM | POA: Diagnosis not present

## 2021-12-16 DIAGNOSIS — E7800 Pure hypercholesterolemia, unspecified: Secondary | ICD-10-CM | POA: Diagnosis not present

## 2021-12-16 LAB — HM DIABETES EYE EXAM

## 2021-12-23 DIAGNOSIS — M5442 Lumbago with sciatica, left side: Secondary | ICD-10-CM | POA: Diagnosis not present

## 2022-01-01 ENCOUNTER — Telehealth: Payer: Self-pay | Admitting: *Deleted

## 2022-01-01 NOTE — Telephone Encounter (Signed)
° °  Telephone encounter was:  Successful.  01/01/2022 Name: Beth [name] MRN: [id number] DOB: [birth year]  [name] is a 76 y.o. year old female who is a primary care patient of Stacks, Cletus Gash, MD . The community resource team was consulted for assistance with Transportation Needs   Care guide performed the following interventions: Patient provided with information about care guide support team and interviewed to confirm resource needs.called [place] with the patient  she has 24 one way rides and if she asks for assistance when scheduling the rides they will push her down the ramp to the vehicle and assist her as needed in her transportation, patient had no other concerns and was provided careguides contact information if  any other needs arise.  Follow Up Plan:  No further follow up planned at this time. The patient has been provided with needed resources. [place], Care Management  [phone] [address] , [place] [address] Email : [email or web address]

## 2022-01-20 ENCOUNTER — Other Ambulatory Visit: Payer: Self-pay

## 2022-01-20 ENCOUNTER — Telehealth (INDEPENDENT_AMBULATORY_CARE_PROVIDER_SITE_OTHER): Payer: Medicare HMO | Admitting: Infectious Disease

## 2022-01-20 ENCOUNTER — Telehealth: Payer: Self-pay | Admitting: *Deleted

## 2022-01-20 DIAGNOSIS — T8453XD Infection and inflammatory reaction due to internal right knee prosthesis, subsequent encounter: Secondary | ICD-10-CM | POA: Diagnosis not present

## 2022-01-20 DIAGNOSIS — A498 Other bacterial infections of unspecified site: Secondary | ICD-10-CM

## 2022-01-20 DIAGNOSIS — M009 Pyogenic arthritis, unspecified: Secondary | ICD-10-CM | POA: Diagnosis not present

## 2022-01-20 DIAGNOSIS — I10 Essential (primary) hypertension: Secondary | ICD-10-CM

## 2022-01-20 NOTE — Telephone Encounter (Signed)
° °  Telephone encounter was:  Successful.  01/20/2022 Name: SHAMELL HITTLE MRN: [id number] DOB: 1946-01-28  CORLEEN OTWELL is a 76 y.o. year old female who is a primary care patient of Stacks, Cletus Gash, MD . The community resource team was consulted for assistance with Transportation Needs Patient called as she had trouble booking transportation so we called [employer] together and corrected her phone number so she would not have issues with arranging her transportation in the future   Care guide performed the following interventions: Patient provided with information about care guide support team and interviewed to confirm resource needs Follow up call placed to community resources to determine status of patients referral.  Follow Up Plan:  No further follow up planned at this time. The patient has been provided with needed resources.  [facility], Care Management  [phone] [address] , [place] [address] Email : [email or web address]

## 2022-01-20 NOTE — Progress Notes (Signed)
Virtual Visit via Telephone Note  I connected with Beth [name] on 01/20/22 at  1:45 PM EST by telephone and verified that I am speaking with the correct person using two identifiers.  Location: Patient: Home Provider: [facility]   I discussed the limitations, risks, security and privacy concerns of performing an evaluation and management service by telephone and the availability of in person appointments. I also discussed with the patient that there may be a patient responsible charge related to this service. The patient expressed understanding and agreed to proceed.   History of Present Illness:   76  y.o. 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  She apparently originally had right knee total arthroplasty in July 2015.  In the interim she had an I&D in October 2015 with poly-exchange followed by antibiotics.  She then had in 2016 a right knee resection arthroplasty with placement of antibiotic spacers followed by antibiotics and followed by reimplantation of new prosthetic knee.  Unfortunately she again had failure and of the attempts to eradicate infection in his knee and her prosthetic knee has been removed and antibiotic spacer placed   Is my understanding that no organism had been isolated in the past.    She subsequently grew a pansensitive pseudomonal species.  She was discharged on cefepime but then admitted with renal failure and changed to oral ciprofloxacin.   She was due to complete the ciprofloxacin on 17 December 2018    In the interim she was seen by me and we rechecked inflammatory markers which were still quite high sed rate still in the 70s and CRP had gone up into the 20s.   Therefore I had continued her on oral ciprofloxacin in the nursing facility.   When she was discharged some skilled nurse facility on 28 December 2019 they unfortunately did not send her home with oral antibiotics.   She then resumed the oral l ciprofloxacin.   However in the interim she had developed some severe right-sided groin pain    We obtained an MRI of the hip in August 2020 which did not show osteomyelitis, or septic hep but did show severe osteoarthritic changes of the sacroiliac joints bilaterally and also question of some hamstring tendinosis.  Beth [name] saw the patient in clinic reviewed films and had some concern whether or not the tendinosis seen on imaging could be related to chronic fluoroquinolone usage.  The patient's pain in her knee had improved as mentioned inflammatory markers had trended down.  Patient was taken off ciprofloxacin to see how she would do both in terms of her groin and hip pain and also her knee pain.   Since then her hip pain  improved dramatically suggesting the possibility that fluoroquinolones could have been in play in terms of causing a tendinopathy.   Her knee pain had remained stable off of antibiotics   Since August of 2020.   Several visits aggo was encouraged by how she was doing but when I checked her inflammatory markers they had become more elevated.   She had been seen by Dr. Maureen Ralphs and recounts him removing fluid from her knee and sending this for analysis and culture.  However when I went into "care everywhere portal the notes indicated that fluid cannot be withdrawn.   Dr. Maureen Ralphs also mentioned doing an MRI of the knee prior to considering reimplanting a new knee.     Did obtain MRI of the knee on December 28, 2019 and this was read as showing:     Antibiotic laden cement with some postsurgical changes in the distal femur and proximal tibia there was some irregular enhancement on the margins of the tracks that were felt by radiology possibly concerning for osteomyelitis versus residual postsurgical changes.   I  personally reviewed these films   I alerted Dr. Maureen Ralphs re this MRI and he also reviewed himself.  He felt the changes were more consistent with abrasive changes that can happen in  the context of chronic antibiotic cement being in place he was less concerned by the radiographic appearance for possible osteomyelitis.   We both continued to be concerned about her elevated inflammatory markers.   She does have a history of gout though it has not flared all in recent times she is really not on any medicines for gout at present.    4 months after her MRI.  Knee pain was stable largely occurs at night or if she shifts around and bears weight on it.   Inflammatory markers were persistently abnormal.   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 No progressive bone destruction was seen.     Dr Maureen Ralphs took her to the OR in August 28, 2020 and performed I and D , removal of antibiotic spacer, with  Debridement of the femoral canal as well as the tibia, placement of new antibiotic spacer with 2 steinman pins in them that effectively have left patient with leg with knee unbent giving it similar appearance to a fused knee   She was seen by my partner Dr. Johnnye Sima and she did not want to be on IV antibiotics> cultures from femur, joint and tibia were unrevealing . Dr. Johnnye Sima placed her on levaquin with plan to treat for protracted time period and for he to followup in [facility] in 2-3 weeks post dc.   Instead she never made it to [facility] until I saw her on  March 116th, 2022. Additionally she stopped her levaquin--likely in October.     Her inflammatory markers were still elevated.   I did talk to Dr. Maureen Ralphs there will times recently including the most recent visits.  He was confident the degree of debridement he achieved that infected bone was removed surgically though she did ave the desired antibiotic course that we had  recommended at that time in the fall.   Was at last visit   complaining more pain now in her area of the tibia which is tender to palpation.   Recheck labs at that visit and marked inflammatory markers remained persistently elevated.  She has been seen at [facility] in Dr Alusio's office.   They obtain plain films which showed that the knee had settled into a varus position with shifting of the distal pins.  X-rays reviewed by Dr. Maureen Ralphs as well.  Vehicle options were reviewed with the patient but she still is very much against having any further surgical interventions.    Because last time was difficult for her to get to appointment we did agree to make today's appointment virtual.  Her inflammatory markers came down a little bit though they are still elevated.  She continues to have fairly stable amount of pain in her right knee.  She is without fevers chills malaise nausea vomiting weight loss night sweats or other symptoms to suggest any systemic infection.    HPI as above otherwise 12 point view system   Past Medical History:  Diagnosis Date   Anemia    Arthritis    Knee both knees   Blood transfusion without reported diagnosis 2012   anemia;pt denies transfusion stated was only on iron tablet   Cataract    left   CKD (chronic kidney disease), stage III ([place])    Diabetes mellitus without complication ([place])    Family history of anesthesia complication    sister very slow to awaken after anesthesia;severe vomiting    Gout    left elbow   Herpes infection 08/09/2014   Saw doctor Wed. 08-09-14 Right eye   Hyperlipidemia    Hypertension    Nocturia    3-4 times per night   Osteoarthritis of both sacroiliac joints ([place]) 08/22/2019   Osteomyelitis of right tibia ([place]) 07/23/2020   Other acute osteomyelitis, right femur ([place]) 07/23/2020   Pseudomonas aeruginosa infection 12/15/2018   Stroke ([place]) 2006   x 1 no deficits noted     Past Surgical History:  Procedure  Laterality Date   [place] Right 1983   colonscopy  June 21, 2014   EXCISIONAL TOTAL KNEE ARTHROPLASTY WITH ANTIBIOTIC SPACERS Right 02/16/2015   Procedure: RIGHT KNEE RESECTION ARTHROPLASTY WITH ANTIBIOTIC SPACERS;  Surgeon: [name], MD;  Location: [facility];  Service: Orthopedics;  Laterality: Right;   EXCISIONAL TOTAL KNEE ARTHROPLASTY WITH ANTIBIOTIC SPACERS Right 11/03/2018   Procedure: Right knee resection arthroplasty; antibiotic spacer;  Surgeon: [name], MD;  Location: [facility];  Service: Orthopedics;  Laterality: Right;  Adductor Block   I & D KNEE WITH POLY EXCHANGE Right 10/02/2014   Procedure: IRRIGATION AND DEBRIDEMENT RIGHT KNEE WITH POLY EXCHANGE;  Surgeon: Gearlean Alf, MD;  Location: [facility];  Service: Orthopedics;  Laterality: Right;   I & D KNEE WITH POLY EXCHANGE Right 08/27/2020   Procedure: IRRIGATION AND DEBRIDEMENT; SPACER EXCHANGE RIGHT KNEE with multiple specimens;  Surgeon: [name], MD;  Location: [facility];  Service: Orthopedics;  Laterality: Right;  26mn   INCISION AND DRAINAGE OF WOUND Right 01/14/2017   Procedure: IRRIGATION AND DEBRIDEMENT WOUND;  Surgeon: FGaynelle Arabian MD;  Location: [facility];  Service: Orthopedics;  Laterality: Right;  requests 462ms   JOINT REPLACEMENT  06/2014   right knee   nasal cauterization  2012   PATELLAR TENDON REPAIR Right 08/11/2014   Procedure: RIGHT PATELLA TENDON REPAIR;  Surgeon: FrGearlean AlfMD;  Location: [facility];  Service: Orthopedics;  Laterality: Right;   REIMPLANTATION OF TOTAL KNEE Right 05/23/2015   Procedure: RIGHT KNEE ARTHROPLASTY REIMPLANTATION;  Surgeon: FrGaynelle ArabianMD;  [facility];  Service: Orthopedics;  Laterality: Right;   TOTAL KNEE ARTHROPLASTY Right 07/03/2014   Procedure: RIGHT TOTAL KNEE ARTHROPLASTY;  Surgeon: FrGearlean AlfMD;  Location: [facility];  Service: Orthopedics;  Laterality: Right;   TUBAL LIGATION      Family History  Problem  Relation Age of Onset   Ovarian cancer Mother    Cancer Mother    Peripheral vascular disease Father        with amputation of both legs   Hypertension Father    Heart disease Brother [id number] Kidney disease Daughter    Heart disease Daughter 4544 Diabetes Son  Diabetes Son    Colon cancer Neg Hx    Esophageal cancer Neg Hx    Stomach cancer Neg Hx    Rectal cancer Neg Hx       Social History   Socioeconomic History   Marital status: Widowed    Spouse name: Not on file   Number of children: 9   Years of education: 8th   Highest education level: 8th grade  Occupational History    Employer: RETIRED  Tobacco Use   Smoking status: Never   Smokeless tobacco: Never  Vaping Use   Vaping Use: Never used  Substance and Sexual Activity   Alcohol use: No   Drug use: No   Sexual activity: Not Currently  Other Topics Concern   Not on file  Social History Narrative   She has 6 living adult children and 3 who have passed away and many grandchildren and great grandchildren. Patient is retired. 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    One child in [place], others in [place], [place], [place], and [place]   Social Determinants of Health   Financial Resource Strain: Not on [employer] Insecurity: Not on file  Transportation Needs: No Transportation Needs   Lack of Transportation (Medical): No   Lack of Transportation (Non-Medical): No  Physical Activity: Insufficiently Active   Days of Exercise per Week: 7 days   Minutes of Exercise per Session: 20 min  Stress: No Stress Concern Present   Feeling of Stress : Only a little  Social Connections: Moderately Isolated   Frequency of Communication with Friends and Family: More than three times a week   Frequency of Social Gatherings with Friends and Family: More than three times a week   Attends Religious Services: 1 to 4 times per year   Active Member of [employer] or Organizations: No   Attends Archivist Meetings:  Never   Marital Status: Divorced    Allergies  Allergen Reactions   Aleve [Naproxen Sodium] Other (See Comments)    Heart races   Asa [Aspirin] Other (See Comments)    Nose bleeding   Ciprofloxacin Other (See Comments)    Possible hamstring tendinopathy   Clonidine Derivatives Other (See Comments)    Dizziness and weakness   Shellfish Allergy Nausea And Vomiting     Current Outpatient Medications:    acetaminophen (TYLENOL) 500 MG tablet, Take 2 tablets (1,000 mg total) by mouth every 6 (six) hours as needed for mild pain., Disp: 90 tablet, Rfl: 1   amLODipine (NORVASC) 10 MG tablet, Take 1 tablet (10 mg total) by mouth daily., Disp: 90 tablet, Rfl: 3   atorvastatin (LIPITOR) 20 MG tablet, Take 1 tablet (20 mg total) by mouth every evening., Disp: 90 tablet, Rfl: 3   Blood Glucose Calibration (TRUE METRIX LEVEL 1) Low SOLN, Use with glucose monitor Dx E11.9, Disp: 3 each, Rfl: 0   Blood Glucose Monitoring Suppl (TRUE METRIX AIR GLUCOSE METER) w/Device KIT, 1 Device by Does not apply route 4 (four) times daily. Dx E11.9, Disp: 1 kit, Rfl: 0   diltiazem (CARDIZEM CD) 240 MG 24 hr capsule, Take 1 capsule (240 mg total) by mouth daily., Disp: 90 capsule, Rfl: 3   ferrous sulfate 325 (65 FE) MG tablet, Take 325 mg by mouth daily., Disp: , Rfl:    glucose blood (TRUE METRIX BLOOD GLUCOSE TEST) test strip, check glucose up to four times daily Dx E11.9, Disp: 400 strip, Rfl: 3  hydrALAZINE (APRESOLINE) 100 MG tablet, Take 1 tablet (100 mg total) by mouth 3 (three) times daily., Disp: 270 tablet, Rfl: 3   Nebivolol HCl 20 MG TABS, TAKE 1 TABLET EVERY DAY, Disp: 90 tablet, Rfl: 3   olmesartan (BENICAR) 40 MG tablet, TAKE ONE (1) TABLET EACH DAY, Disp: 90 tablet, Rfl: 3   Omega-3 Fatty Acids (FISH OIL) 1000 MG CAPS, Take 1,000 mg by mouth. Take one tablet by mouth 2 to 3 times weekly, Disp: , Rfl:    TRUEplus Lancets 33G MISC, Test QID Dx E11.9, Disp: 400 each, Rfl: 3   isosorbide dinitrate  (ISORDIL) 10 MG tablet, Take 1 tablet (10 mg total) by mouth 3 (three) times daily. (Patient not taking: Reported on 12/11/2021), Disp: 270 tablet, Rfl: 3   metFORMIN (GLUCOPHAGE-XR) 500 MG 24 hr tablet, Take 1 tablet (500 mg total) by mouth daily. (Patient not taking: Reported on 01/20/2022), Disp: 90 tablet, Rfl: 3   traMADol (ULTRAM) 50 MG tablet, Take 1-2 tablets (50-100 mg total) by mouth every 6 (six) hours as needed for moderate pain. (Patient not taking: Reported on 01/20/2022), Disp: 40 tablet, Rfl: 0 No current facility-administered medications for this visit.  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  She is remained off antibiotics for at least 8 months now without worsening of her pain or inflammatories markers do remain slightly elevated when checked last but clinically she seems stable.  She does have comorbid gout which is not acting at this point in time.  I will have her follow-up in a few months time with [place] to see how she is doing she does not have follow-up with orthopedic surgery at this point in time.    Follow Up Instructions:    I discussed the assessment and treatment plan with the patient. The patient was provided an opportunity to ask questions and all were answered. The patient agreed with the plan and  demonstrated an understanding of the instructions.   The patient was advised to call back or seek an in-person evaluation if the symptoms worsen or if the condition fails to improve as anticipated.  I provided 22  minutes of non-face-to-face time during this encounter.   Alcide Evener, MD

## 2022-01-27 ENCOUNTER — Other Ambulatory Visit: Payer: Self-pay | Admitting: Family Medicine

## 2022-01-28 DIAGNOSIS — B351 Tinea unguium: Secondary | ICD-10-CM | POA: Diagnosis not present

## 2022-01-28 DIAGNOSIS — L84 Corns and callosities: Secondary | ICD-10-CM | POA: Diagnosis not present

## 2022-01-28 DIAGNOSIS — E1142 Type 2 diabetes mellitus with diabetic polyneuropathy: Secondary | ICD-10-CM | POA: Diagnosis not present

## 2022-01-28 DIAGNOSIS — R6889 Other general symptoms and signs: Secondary | ICD-10-CM | POA: Diagnosis not present

## 2022-01-28 DIAGNOSIS — M79676 Pain in unspecified toe(s): Secondary | ICD-10-CM | POA: Diagnosis not present

## 2022-01-30 ENCOUNTER — Telehealth: Payer: Self-pay | Admitting: *Deleted

## 2022-01-30 NOTE — Telephone Encounter (Signed)
° °  Telephone encounter was:  Successful.  01/30/2022 Name: Beth [name] MRN: [id number] DOB: 04-Jun-1946  Beth [name] is a 76 y.o. year old female who is a primary care patient of Stacks, Cletus Gash, MD . The community resource team was consulted for assistance with Transportation Needs   Care guide performed the following interventions: Patient was scheduled for pickup for transportation , they would not take her down the ramp to the [place] and can not take you to the office . We had talked with customer service, .  Follow Up Plan:  Client will call me back when she needs transportation   [facility] , [place], Care Management  [phone] [address] , [place] [address] Email : [email or web address]

## 2022-02-17 ENCOUNTER — Other Ambulatory Visit: Payer: Self-pay | Admitting: Family Medicine

## 2022-04-25 ENCOUNTER — Other Ambulatory Visit: Payer: Self-pay | Admitting: Family Medicine

## 2022-04-30 ENCOUNTER — Other Ambulatory Visit: Payer: Self-pay | Admitting: Family Medicine

## 2022-05-12 ENCOUNTER — Ambulatory Visit: Payer: Medicare HMO | Admitting: Infectious Disease

## 2022-05-22 DIAGNOSIS — L84 Corns and callosities: Secondary | ICD-10-CM | POA: Diagnosis not present

## 2022-05-22 DIAGNOSIS — M79676 Pain in unspecified toe(s): Secondary | ICD-10-CM | POA: Diagnosis not present

## 2022-05-22 DIAGNOSIS — E1142 Type 2 diabetes mellitus with diabetic polyneuropathy: Secondary | ICD-10-CM | POA: Diagnosis not present

## 2022-05-22 DIAGNOSIS — B351 Tinea unguium: Secondary | ICD-10-CM | POA: Diagnosis not present

## 2022-06-02 ENCOUNTER — Other Ambulatory Visit: Payer: Self-pay | Admitting: Family Medicine

## 2022-06-17 ENCOUNTER — Ambulatory Visit (INDEPENDENT_AMBULATORY_CARE_PROVIDER_SITE_OTHER): Payer: Medicare HMO | Admitting: Family Medicine

## 2022-06-17 ENCOUNTER — Encounter: Payer: Self-pay | Admitting: Family Medicine

## 2022-06-17 VITALS — BP 126/69 | HR 91 | Temp 97.6°F

## 2022-06-17 DIAGNOSIS — I10 Essential (primary) hypertension: Secondary | ICD-10-CM

## 2022-06-17 DIAGNOSIS — E782 Mixed hyperlipidemia: Secondary | ICD-10-CM

## 2022-06-17 DIAGNOSIS — E119 Type 2 diabetes mellitus without complications: Secondary | ICD-10-CM

## 2022-06-17 MED ORDER — ISOSORBIDE DINITRATE 10 MG PO TABS: 10.0000 mg | ORAL_TABLET | Freq: Three times a day (TID) | ORAL | 3 refills | Status: DC

## 2022-06-17 MED ORDER — NEBIVOLOL HCL 20 MG PO TABS: ORAL_TABLET | ORAL | 3 refills | Status: DC

## 2022-06-17 MED ORDER — ATORVASTATIN CALCIUM 20 MG PO TABS: 20.0000 mg | ORAL_TABLET | Freq: Every evening | ORAL | 3 refills | Status: DC

## 2022-06-17 NOTE — Progress Notes (Signed)
Subjective:  Patient ID: Beth [name], female    DOB: 1946-09-11  Age: 76 y.o. MRN: [id number]  CC: Medical Management of Chronic Issues   HPI Beth [name] presents forFollow-up of diabetes. Patient checks blood sugar at home.   100-130s fasting and not checking postprandial Patient denies symptoms such as polyuria, polydipsia, excessive hunger, nausea No significant hypoglycemic spells noted. Medications reviewed. Pt reports taking them regularly without complication/adverse reaction being reported today.     in for follow-up of elevated cholesterol. Doing well without complaints on current medication. Denies side effects of statin including myalgia and arthralgia and nausea. Currently no chest pain, shortness of breath or other cardiovascular related symptoms noted.   presents for  follow-up of hypertension. Patient has no history of headache chest pain or shortness of breath or recent cough. Patient also denies symptoms of TIA such as focal numbness or weakness. Patient denies side effects from medication. States taking it regularly.  Stressed from son being in hospital with pneumonia in [place].     History [name] has a past medical history of Anemia, Arthritis, Blood transfusion without reported diagnosis (2012), Cataract, CKD (chronic kidney disease), stage III ([place]), Diabetes mellitus without complication ([place]), Family history of anesthesia complication, Gout, Herpes infection (08/09/2014), Hyperlipidemia, Hypertension, Nocturia, Osteoarthritis of both sacroiliac joints ([place]) (08/22/2019), Osteomyelitis of right tibia ([place]) (07/23/2020), Other acute osteomyelitis, right femur ([place]) (07/23/2020), Pseudomonas aeruginosa infection (12/15/2018), and Stroke ([place]) (2006).   She has a past surgical history that includes Carpal tunnel release (Right, 1983); colonscopy (June 21, 2014); nasal cauterization (2012); Total knee arthroplasty (Right, 07/03/2014); Patellar tendon repair (Right,  08/11/2014); Joint replacement (06/2014); I & D knee with poly exchange (Right, 10/02/2014); Excisional total knee arthroplasty with antibiotic spacers (Right, 02/16/2015); Reimplantation of total knee (Right, 05/23/2015); Abdominal hysterectomy (1983); Tubal ligation; Incision and drainage of wound (Right, 01/14/2017); Excisional total knee arthroplasty with antibiotic spacers (Right, 11/03/2018); and I & D knee with poly exchange (Right, 08/27/2020).   Her family history includes Cancer in her mother; Diabetes in her son and son; Heart disease (age of onset: 48) in her daughter; Heart disease (age of onset: 36) in her brother; Hypertension in her father; Kidney disease in her daughter; Ovarian cancer in her mother; Peripheral vascular disease in her father.She reports that she has never smoked. She has never used smokeless tobacco. She reports that she does not drink alcohol and does not use drugs.  Current Outpatient Medications on File Prior to Visit  Medication Sig Dispense Refill   acetaminophen (TYLENOL) 500 MG tablet Take 2 tablets (1,000 mg total) by mouth every 6 (six) hours as needed for mild pain. 90 tablet 1   amLODipine (NORVASC) 10 MG tablet Take 1 tablet (10 mg total) by mouth daily. 90 tablet 3   Blood Glucose Calibration (TRUE METRIX LEVEL 1) Low SOLN Use with glucose monitor Dx E11.9 3 each 0   Blood Glucose Monitoring Suppl (TRUE METRIX AIR GLUCOSE METER) w/Device KIT 1 Device by Does not apply route 4 (four) times daily. Dx E11.9 1 kit 0   diltiazem (CARDIZEM CD) 240 MG 24 hr capsule TAKE 1 CAPSULE EVERY DAY 90 capsule 1   ferrous sulfate 325 (65 FE) MG tablet Take 325 mg by mouth daily.     glucose blood (TRUE METRIX BLOOD GLUCOSE TEST) test strip check glucose up to four times daily Dx E11.9 400 strip 3   hydrALAZINE (APRESOLINE) 100 MG tablet Take 1 tablet (100 mg total) by  mouth 3 (three) times daily. 270 tablet 3   olmesartan (BENICAR) 40 MG tablet TAKE ONE (1) TABLET EACH DAY 90  tablet 0   Omega-3 Fatty Acids (FISH OIL) 1000 MG CAPS Take 1,000 mg by mouth. Take one tablet by mouth 2 to 3 times weekly     TRUEplus Lancets 33G MISC Test QID Dx E11.9 400 each 3   metFORMIN (GLUCOPHAGE-XR) 500 MG 24 hr tablet Take 1 tablet (500 mg total) by mouth daily. (Patient not taking: Reported on 01/20/2022) 90 tablet 3   Current Facility-Administered Medications on File Prior to Visit  Medication Dose Route Frequency Provider Last Rate Last Admin   tranexamic acid (CYKLOKAPRON) 2,000 mg in sodium chloride 0.9 % 50 mL Topical Application  9,528 mg Topical Once [name], MD        ROS Review of Systems  Constitutional: Negative.   HENT: Negative.    Eyes:  Negative for visual disturbance.  Respiratory:  Negative for shortness of breath.   Cardiovascular:  Negative for chest pain.  Gastrointestinal:  Negative for abdominal pain.  Musculoskeletal:  Negative for arthralgias.    Objective:  BP 126/69   Pulse 91   Temp 97.6 F (36.4 C)   SpO2 96%   BP Readings from Last 3 Encounters:  06/17/22 126/69  12/12/21 (!) 122/57  09/25/21 120/70    Wt Readings from Last 3 Encounters:  12/11/21 230 lb (104.3 kg)  09/25/21 233 lb (105.7 kg)  07/22/21 213 lb (96.6 kg)     Physical Exam Constitutional:      General: She is not in acute distress.    Appearance: She is well-developed.  Cardiovascular:     Rate and Rhythm: Normal rate and regular rhythm.  Pulmonary:     Breath sounds: Normal breath sounds.  Musculoskeletal:        General: Deformity (right knee joint surgically absent) present.     Comments: WC bound  Skin:    General: Skin is warm and dry.  Neurological:     Mental Status: She is alert and oriented to person, place, and time.       Assessment & Plan:   Brookelyn was seen today for medical management of chronic issues.  Diagnoses and all orders for this visit:  Controlled type 2 diabetes mellitus without complication, without long-term  current use of insulin (HCC) -     Bayer DCA Hb A1c Waived  Essential hypertension -     CBC with Differential/Platelet -     CMP14+EGFR  Mixed hyperlipidemia -     Lipid panel  Other orders -     atorvastatin (LIPITOR) 20 MG tablet; Take 1 tablet (20 mg total) by mouth every evening. -     Nebivolol HCl 20 MG TABS; TAKE ONE (1) TABLET EACH DAY -     isosorbide dinitrate (ISORDIL) 10 MG tablet; Take 1 tablet (10 mg total) by mouth 3 (three) times daily.      I have discontinued Maryann Alar. Breithaupt's traMADol. I have also changed her atorvastatin. Additionally, I am having her maintain her acetaminophen, ferrous sulfate, True Metrix Air Glucose Meter, True Metrix Level 1, TRUEplus Lancets 33G, True Metrix Blood Glucose Test, hydrALAZINE, Fish Oil, amLODipine, metFORMIN, diltiazem, olmesartan, Nebivolol HCl, and isosorbide dinitrate.  Meds ordered this encounter  Medications   atorvastatin (LIPITOR) 20 MG tablet    Sig: Take 1 tablet (20 mg total) by mouth every evening.    Dispense:  90 tablet  Refill:  3   Nebivolol HCl 20 MG TABS    Sig: TAKE ONE (1) TABLET EACH DAY    Dispense:  90 tablet    Refill:  3   isosorbide dinitrate (ISORDIL) 10 MG tablet    Sig: Take 1 tablet (10 mg total) by mouth 3 (three) times daily.    Dispense:  270 tablet    Refill:  3     Follow-up: Return in about 6 months (around 12/18/2022).  Claretta Fraise, M.D.

## 2022-06-18 LAB — CBC WITH DIFFERENTIAL/PLATELET
Basophils Absolute: 0 10*3/uL (ref 0.0–0.2)
Basos: 1 %
EOS (ABSOLUTE): 0.3 10*3/uL (ref 0.0–0.4)
Eos: 4 %
Hematocrit: 36.5 % (ref 34.0–46.6)
Hemoglobin: 11.2 g/dL (ref 11.1–15.9)
Immature Grans (Abs): 0 10*3/uL (ref 0.0–0.1)
Immature Granulocytes: 0 %
Lymphocytes Absolute: 1.7 10*3/uL (ref 0.7–3.1)
Lymphs: 28 %
MCH: 24.8 pg — ABNORMAL LOW (ref 26.6–33.0)
MCHC: 30.7 g/dL — ABNORMAL LOW (ref 31.5–35.7)
MCV: 81 fL (ref 79–97)
Monocytes Absolute: 0.6 10*3/uL (ref 0.1–0.9)
Monocytes: 10 %
Neutrophils Absolute: 3.5 10*3/uL (ref 1.4–7.0)
Neutrophils: 57 %
Platelets: 326 10*3/uL (ref 150–450)
RBC: 4.51 x10E6/uL (ref 3.77–5.28)
RDW: 14.4 % (ref 11.7–15.4)
WBC: 6.1 10*3/uL (ref 3.4–10.8)

## 2022-06-18 LAB — CMP14+EGFR
ALT: 9 IU/L (ref 0–32)
AST: 16 IU/L (ref 0–40)
Albumin/Globulin Ratio: 1.4 (ref 1.2–2.2)
Albumin: 4.2 g/dL (ref 3.8–4.8)
Alkaline Phosphatase: 130 IU/L — ABNORMAL HIGH (ref 44–121)
BUN/Creatinine Ratio: 18 (ref 12–28)
BUN: 32 mg/dL — ABNORMAL HIGH (ref 8–27)
Bilirubin Total: 0.6 mg/dL (ref 0.0–1.2)
CO2: 21 mmol/L (ref 20–29)
Calcium: 9.2 mg/dL (ref 8.7–10.3)
Chloride: 101 mmol/L (ref 96–106)
Creatinine, Ser: 1.75 mg/dL — ABNORMAL HIGH (ref 0.57–1.00)
Globulin, Total: 3 g/dL (ref 1.5–4.5)
Glucose: 94 mg/dL (ref 70–99)
Potassium: 4.6 mmol/L (ref 3.5–5.2)
Sodium: 136 mmol/L (ref 134–144)
Total Protein: 7.2 g/dL (ref 6.0–8.5)
eGFR: 29 mL/min/{1.73_m2} — ABNORMAL LOW (ref >59)

## 2022-06-18 LAB — LIPID PANEL
Chol/HDL Ratio: 2.5 ratio (ref 0.0–4.4)
Cholesterol, Total: 150 mg/dL (ref 100–199)
HDL: 61 mg/dL (ref >39)
LDL Chol Calc (NIH): 75 mg/dL (ref 0–99)
Triglycerides: 72 mg/dL (ref 0–149)
VLDL Cholesterol Cal: 14 mg/dL (ref 5–40)

## 2022-06-18 LAB — BAYER DCA HB A1C WAIVED: HB A1C (BAYER DCA - WAIVED): 5.5 % (ref 4.8–5.6)

## 2022-06-30 ENCOUNTER — Ambulatory Visit: Payer: Self-pay | Admitting: *Deleted

## 2022-06-30 DIAGNOSIS — E119 Type 2 diabetes mellitus without complications: Secondary | ICD-10-CM

## 2022-06-30 NOTE — Chronic Care Management (AMB) (Signed)
  Chronic Care Management   Note  06/30/2022 Name: Beth [name] MRN: [id number] DOB: Nov 26, 1946   Patient has either met RN Care Management goals, is stable from [facility] Management perspective, or has not recently engaged with the RN Care Manager. I am removing RN Care Manager from Care Team and closing [facility]. If patient is currently engaged with another CCM team member I will forward this encounter to inform them of my case closure. Patient may be eligible for re-engagement with RN Care Manager in the future if necessary and can discuss this with their PCP.  Chong Sicilian, BSN, RN-BC Embedded Chronic Care Manager [facility] / [place] Management Direct Dial: [phone]

## 2022-06-30 NOTE — Patient Instructions (Signed)
Beth [name]  I have previously worked with you through the Chronic Care Management Program at [facility]. Due to program changes I am removing myself from your care team because you've either met our goals, your conditions are stable and no longer require care management, or we haven't engaged within the past 6 months. If you are currently active with another CCM Team Member, you will remain active with them unless they reach out to you with additional information. If you feel that you need RN Care Management services in the future, please talk with your primary care provider to discuss re-engagement with the RN Care Manager that will be assigned to [facility]. This does not affect your status as a patient at [facility].   Thank you for allowing me to participate in your your healthcare journey.  Chong Sicilian, BSN, RN-BC Embedded Chronic Care Manager [facility] / [place] Management Direct Dial: [phone]

## 2022-07-16 ENCOUNTER — Other Ambulatory Visit: Payer: Self-pay | Admitting: Family Medicine

## 2022-08-07 ENCOUNTER — Encounter: Payer: Self-pay | Admitting: Family

## 2022-08-07 ENCOUNTER — Ambulatory Visit: Payer: Medicare HMO | Admitting: Family

## 2022-08-07 ENCOUNTER — Other Ambulatory Visit: Payer: Self-pay

## 2022-08-07 ENCOUNTER — Ambulatory Visit: Payer: Medicare HMO | Admitting: Infectious Diseases

## 2022-08-07 VITALS — BP 134/41 | HR 58 | Temp 97.5°F

## 2022-08-07 DIAGNOSIS — T8453XS Infection and inflammatory reaction due to internal right knee prosthesis, sequela: Secondary | ICD-10-CM

## 2022-08-07 DIAGNOSIS — T8453XD Infection and inflammatory reaction due to internal right knee prosthesis, subsequent encounter: Secondary | ICD-10-CM | POA: Diagnosis not present

## 2022-08-07 NOTE — Assessment & Plan Note (Signed)
Beth [name] continues to do well with current antibiotic spacer in her right knee and is against any future surgeries. She has been off antibiotics for over 1 year now and has not had a recurrence of infection. Discussed that the spacer is not necessarily a long term solution and reviewed the signs/symptoms to be aware of should infection return. At this point no antibiotics appear to be indicated and follow up with ID can be as needed.

## 2022-08-07 NOTE — Patient Instructions (Signed)
Nice to see you.  No additional treatment or follow up needed  If you have any signs/symptoms of infection please follow up with orthopedics.   Follow up with ID as needed.   Have a great day and stay safe!

## 2022-08-07 NOTE — Progress Notes (Signed)
Subjective:    Patient ID: Beth [name], female    DOB: 1945/12/24, 76 y.o.   MRN: [id number]  Chief Complaint  Patient presents with   Follow-up    HPI:  Beth [name] is a 76 y.o. 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 Tommy Medal for telehealth visit presents today for follow up. Her daughter is present with her for today's visit.   Beth [name] has been off antibiotics for over 1 year now and has been doing well with the current antibiotic spacer in place. Last saw Dr. Ricki Rodriguez over a year ago and the recommendation at that time was for above knee amputation which she refused. Does have swelling in her bilateral legs and occasional right knee pain. Ambulating well with a walker at home. No systemic symptoms of fevers, chills or sweats.    Allergies  Allergen Reactions   Aleve [Naproxen Sodium] Other (See Comments)    Heart races   Asa [Aspirin] Other (See Comments)    Nose bleeding   Ciprofloxacin Other (See Comments)    Possible hamstring tendinopathy   Clonidine Derivatives Other (See Comments)    Dizziness and weakness   Shellfish Allergy Nausea And Vomiting      Outpatient Medications Prior to Visit  Medication Sig Dispense Refill   acetaminophen (TYLENOL) 500 MG tablet Take 2 tablets (1,000 mg total) by mouth every 6 (six) hours as needed for mild pain. 90 tablet 1   amLODipine (NORVASC) 10 MG tablet Take 1 tablet (10 mg total) by mouth daily. 90 tablet 3   atorvastatin (LIPITOR) 20 MG tablet Take 1 tablet (20 mg total) by mouth every evening. 90 tablet 3   Blood Glucose Calibration (TRUE METRIX LEVEL 1) Low SOLN Use with glucose monitor Dx E11.9 3 each 0   Blood Glucose Monitoring Suppl (TRUE METRIX AIR GLUCOSE METER) w/Device KIT 1 Device by Does not apply route 4 (four) times daily. Dx E11.9 1 kit 0   diltiazem (CARDIZEM CD) 240 MG 24 hr capsule TAKE 1 CAPSULE EVERY DAY 90 capsule 1   ferrous  sulfate 325 (65 FE) MG tablet Take 325 mg by mouth daily.     glucose blood (TRUE METRIX BLOOD GLUCOSE TEST) test strip check glucose up to four times daily Dx E11.9 400 strip 3   hydrALAZINE (APRESOLINE) 100 MG tablet Take 1 tablet (100 mg total) by mouth 3 (three) times daily. 270 tablet 3   isosorbide dinitrate (ISORDIL) 10 MG tablet Take 1 tablet (10 mg total) by mouth 3 (three) times daily. 270 tablet 3   Nebivolol HCl 20 MG TABS TAKE ONE (1) TABLET EACH DAY 90 tablet 3   olmesartan (BENICAR) 40 MG tablet TAKE ONE (1) TABLET EACH DAY 90 tablet 1   Omega-3 Fatty Acids (FISH OIL) 1000 MG CAPS Take 1,000 mg by mouth. Take one tablet by mouth 2 to 3 times weekly     TRUEplus Lancets 33G MISC Test QID Dx E11.9 400 each 3   metFORMIN (GLUCOPHAGE-XR) 500 MG 24 hr tablet Take 1 tablet (500 mg total) by mouth daily. (Patient not taking: Reported on 01/20/2022) 90 tablet 3   Facility-Administered Medications Prior to Visit  Medication Dose Route Frequency Provider Last Rate Last Admin   tranexamic acid (CYKLOKAPRON) 2,000 mg in sodium chloride 0.9 % 50 mL Topical Application  9,326 mg Topical Once [name], MD  Past Medical History:  Diagnosis Date   Anemia    Arthritis    Knee both knees   Blood transfusion without reported diagnosis 2012   anemia;pt denies transfusion stated was only on iron tablet   Cataract    left   CKD (chronic kidney disease), stage III ([place])    Diabetes mellitus without complication ([place])    Family history of anesthesia complication    sister very slow to awaken after anesthesia;severe vomiting    Gout    left elbow   Herpes infection 08/09/2014   Saw doctor Wed. 08-09-14 Right eye   Hyperlipidemia    Hypertension    Nocturia    3-4 times per night   Osteoarthritis of both sacroiliac joints ([place]) 08/22/2019   Osteomyelitis of right tibia ([place]) 07/23/2020   Other acute osteomyelitis, right femur ([place]) 07/23/2020   Pseudomonas aeruginosa infection  12/15/2018   Stroke ([place]) 2006   x 1 no deficits noted      Past Surgical History:  Procedure Laterality Date   [place] Right 1983   colonscopy  June 21, 2014   EXCISIONAL TOTAL KNEE ARTHROPLASTY WITH ANTIBIOTIC SPACERS Right 02/16/2015   Procedure: RIGHT KNEE RESECTION ARTHROPLASTY WITH ANTIBIOTIC SPACERS;  Surgeon: [name], MD;  Location: [facility];  Service: Orthopedics;  Laterality: Right;   EXCISIONAL TOTAL KNEE ARTHROPLASTY WITH ANTIBIOTIC SPACERS Right 11/03/2018   Procedure: Right knee resection arthroplasty; antibiotic spacer;  Surgeon: [name], MD;  Location: [facility];  Service: Orthopedics;  Laterality: Right;  Adductor Block   I & D KNEE WITH POLY EXCHANGE Right 10/02/2014   Procedure: IRRIGATION AND DEBRIDEMENT RIGHT KNEE WITH POLY EXCHANGE;  Surgeon: Gearlean Alf, MD;  Location: [facility];  Service: Orthopedics;  Laterality: Right;   I & D KNEE WITH POLY EXCHANGE Right 08/27/2020   Procedure: IRRIGATION AND DEBRIDEMENT; SPACER EXCHANGE RIGHT KNEE with multiple specimens;  Surgeon: [name], MD;  Location: [facility];  Service: Orthopedics;  Laterality: Right;  23mn   INCISION AND DRAINAGE OF WOUND Right 01/14/2017   Procedure: IRRIGATION AND DEBRIDEMENT WOUND;  Surgeon: FGaynelle Arabian MD;  Location: [facility];  Service: Orthopedics;  Laterality: Right;  requests 462ms   JOINT REPLACEMENT  06/2014   right knee   nasal cauterization  2012   PATELLAR TENDON REPAIR Right 08/11/2014   Procedure: RIGHT PATELLA TENDON REPAIR;  Surgeon: FrGearlean AlfMD;  Location: [facility];  Service: Orthopedics;  Laterality: Right;   REIMPLANTATION OF TOTAL KNEE Right 05/23/2015   Procedure: RIGHT KNEE ARTHROPLASTY REIMPLANTATION;  Surgeon: FrGaynelle ArabianMD;  Location: [facility];  Service: Orthopedics;  Laterality: Right;   TOTAL KNEE ARTHROPLASTY Right 07/03/2014   Procedure: RIGHT TOTAL KNEE ARTHROPLASTY;  Surgeon: FrGearlean AlfMD;  Location: [facility];  Service: Orthopedics;  Laterality: Right;   TUBAL LIGATION         Review of Systems  Constitutional:  Negative for chills, diaphoresis, fatigue and fever.  Respiratory:  Negative for cough, chest tightness, shortness of breath and wheezing.   Cardiovascular:  Positive for leg swelling. Negative for chest pain.  Gastrointestinal:  Negative for abdominal pain, diarrhea, nausea and vomiting.      Objective:    BP (!) 134/41   Pulse (!) 58   Temp (!) 97.5 F (36.4 C) (Oral)   SpO2 95%  Nursing note and vital signs reviewed.  Physical Exam Constitutional:      General:  She is not in acute distress.    Appearance: She is well-developed.     Comments: Seated in the wheelchair; pleasant.   Cardiovascular:     Rate and Rhythm: Normal rate and regular rhythm.     Heart sounds: Normal heart sounds.  Pulmonary:     Effort: Pulmonary effort is normal.     Breath sounds: Normal breath sounds.  Skin:    General: Skin is warm and dry.  Neurological:     Mental Status: She is alert and oriented to person, place, and time.  Psychiatric:        Mood and Affect: Mood normal.         06/17/2022    4:06 PM 06/17/2022    4:00 PM 01/20/2022    1:32 PM 12/12/2021    3:28 PM 12/11/2021    2:20 PM  Depression screen PHQ 2/9  Decreased Interest 0 0 0 0 1  Down, Depressed, Hopeless 0 0 0 0 0  PHQ - 2 Score 0 0 0 0 1  Altered sleeping 2   0   Tired, decreased energy 0   0   Change in appetite 0   0   Feeling bad or failure about yourself  0   0   Trouble concentrating 0   0   Moving slowly or fidgety/restless 0   0   Suicidal thoughts 0   0   PHQ-9 Score 2   0   Difficult doing work/chores Somewhat difficult   Not difficult at all        Assessment & Plan:    Patient Active Problem List   Diagnosis Date Noted   Murmur 09/24/2021   Septic joint of right knee joint ([place]) 08/28/2020   Septic arthritis of knee, right ([place]) 08/27/2020   Osteomyelitis of right tibia ([place])  07/23/2020   Other acute osteomyelitis, right femur ([place]) 07/23/2020   Hamstring tendinitis of right thigh 08/22/2019   Osteoarthritis of both sacroiliac joints ([place]) 08/22/2019   Right groin pain 06/21/2019   History of gout 01/04/2019   Pseudomonas aeruginosa infection 12/15/2018   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 She has been off antibiotics for over 1 year now and has not had a recurrence of infection. Discussed that the spacer is not necessarily a long term solution and reviewed the signs/symptoms to be aware of should infection return. At this point no antibiotics appear to be indicated and follow up with ID can be as needed.         I am having Beth [name] maintain her acetaminophen, ferrous sulfate, True Metrix Air Glucose Meter, True Metrix Level 1, TRUEplus Lancets 33G, True Metrix Blood Glucose Test, hydrALAZINE, Fish Oil, amLODipine, metFORMIN, diltiazem, atorvastatin, Nebivolol HCl, isosorbide dinitrate, and olmesartan.   Follow-up: Return if symptoms worsen or fail to improve.   Terri Piedra, MSN, FNP-C Nurse Practitioner [facility] for Infectious Disease [facility]  [place] number: [phone]

## 2022-08-29 ENCOUNTER — Other Ambulatory Visit: Payer: Self-pay | Admitting: Family Medicine

## 2022-09-16 DIAGNOSIS — L84 Corns and callosities: Secondary | ICD-10-CM | POA: Diagnosis not present

## 2022-09-16 DIAGNOSIS — B351 Tinea unguium: Secondary | ICD-10-CM | POA: Diagnosis not present

## 2022-09-16 DIAGNOSIS — M79676 Pain in unspecified toe(s): Secondary | ICD-10-CM | POA: Diagnosis not present

## 2022-09-16 DIAGNOSIS — E1142 Type 2 diabetes mellitus with diabetic polyneuropathy: Secondary | ICD-10-CM | POA: Diagnosis not present

## 2022-12-15 ENCOUNTER — Ambulatory Visit (INDEPENDENT_AMBULATORY_CARE_PROVIDER_SITE_OTHER): Payer: Medicare HMO

## 2022-12-15 VITALS — Ht 66.0 in | Wt 220.0 lb

## 2022-12-15 DIAGNOSIS — Z0000 Encounter for general adult medical examination without abnormal findings: Secondary | ICD-10-CM | POA: Diagnosis not present

## 2022-12-15 DIAGNOSIS — Z780 Asymptomatic menopausal state: Secondary | ICD-10-CM

## 2022-12-15 NOTE — Progress Notes (Signed)
Subjective:   Beth [name] is a 77 y.o. female who presents for Medicare Annual (Subsequent) preventive examination. I connected with  [name] on 12/15/22 by a audio enabled telemedicine application and verified that I am speaking with the correct person using two identifiers.  Patient Location: Home  Provider Location: Home Office  I discussed the limitations of evaluation and management by telemedicine. The patient expressed understanding and agreed to proceed.  Review of Systems     Cardiac Risk Factors include: advanced age (>11mn, >>33women);hypertension;dyslipidemia     Objective:    Today's Vitals   12/15/22 1445  Weight: 220 lb (99.8 kg)  Height: '5\' 6"'$  (1.676 m)   Body mass index is 35.51 kg/m.     12/15/2022    2:48 PM 12/11/2021    2:27 PM 08/27/2020    7:00 PM 08/21/2020    2:54 PM 04/02/2020   11:41 AM 12/11/2019   10:49 PM 12/03/2018   11:20 PM  Advanced Directives  Does Patient Have a Medical Advance Directive? No No Yes Yes No No Yes  Type of APersonnel officerof AFederal-MogulPower of Attorney  Does patient want to make changes to medical advance directive?   No - Patient declined    No - Patient declined  Copy of HNardinin Chart?       No - copy requested  Would patient like information on creating a medical advance directive? No - Patient declined No - Patient declined   No - Patient declined No - Patient declined     Current Medications (verified) Outpatient Encounter Medications as of 12/15/2022  Medication Sig   acetaminophen (TYLENOL) 500 MG tablet Take 2 tablets (1,000 mg total) by mouth every 6 (six) hours as needed for mild pain.   amLODipine (NORVASC) 10 MG tablet Take 1 tablet (10 mg total) by mouth daily.   atorvastatin (LIPITOR) 20 MG tablet Take 1 tablet (20 mg total) by mouth every evening.   Blood Glucose Calibration (TRUE METRIX LEVEL 1) Low SOLN Use with  glucose monitor Dx E11.9   Blood Glucose Monitoring Suppl (TRUE METRIX AIR GLUCOSE METER) w/Device KIT 1 Device by Does not apply route 4 (four) times daily. Dx E11.9   diltiazem (CARDIZEM CD) 240 MG 24 hr capsule TAKE 1 CAPSULE EVERY DAY   ferrous sulfate 325 (65 FE) MG tablet Take 325 mg by mouth daily.   glucose blood (TRUE METRIX BLOOD GLUCOSE TEST) test strip check glucose up to four times daily Dx E11.9   hydrALAZINE (APRESOLINE) 100 MG tablet TAKE ONE (1) TABLET THREE (3) TIMES EACH DAY   isosorbide dinitrate (ISORDIL) 10 MG tablet Take 1 tablet (10 mg total) by mouth 3 (three) times daily.   Nebivolol HCl 20 MG TABS TAKE ONE (1) TABLET EACH DAY   olmesartan (BENICAR) 40 MG tablet TAKE ONE (1) TABLET EACH DAY   Omega-3 Fatty Acids (FISH OIL) 1000 MG CAPS Take 1,000 mg by mouth. Take one tablet by mouth 2 to 3 times weekly   TRUEplus Lancets 33G MISC Test QID Dx E11.9   metFORMIN (GLUCOPHAGE-XR) 500 MG 24 hr tablet Take 1 tablet (500 mg total) by mouth daily. (Patient not taking: Reported on 01/20/2022)   Facility-Administered Encounter Medications as of 12/15/2022  Medication   tranexamic acid (CYKLOKAPRON) 2,000 mg in sodium chloride 0.9 % 50 mL Topical Application    Allergies (verified)  Aleve [naproxen sodium], Asa [aspirin], Ciprofloxacin, Clonidine derivatives, and Shellfish allergy   History: Past Medical History:  Diagnosis Date   Anemia    Arthritis    Knee both knees   Blood transfusion without reported diagnosis 2012   anemia;pt denies transfusion stated was only on iron tablet   Cataract    left   CKD (chronic kidney disease), stage III ([place])    Diabetes mellitus without complication ([place])    Family history of anesthesia complication    sister very slow to awaken after anesthesia;severe vomiting    Gout    left elbow   Herpes infection 08/09/2014   Saw doctor Wed. 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   Other acute osteomyelitis, right femur ([place]) 07/23/2020   Pseudomonas aeruginosa infection 12/15/2018   Stroke ([place]) 2006   x 1 no deficits noted    Past Surgical History:  Procedure Laterality Date   [place] Right 1983   colonscopy  June 21, 2014   EXCISIONAL TOTAL KNEE ARTHROPLASTY WITH ANTIBIOTIC SPACERS Right 02/16/2015   Procedure: RIGHT KNEE RESECTION ARTHROPLASTY WITH ANTIBIOTIC SPACERS;  Surgeon: [name], MD;  Location: [facility];  Service: Orthopedics;  Laterality: Right;   EXCISIONAL TOTAL KNEE ARTHROPLASTY WITH ANTIBIOTIC SPACERS Right 11/03/2018   Procedure: Right knee resection arthroplasty; antibiotic spacer;  Surgeon: [name], MD;  Location: [facility];  Service: Orthopedics;  Laterality: Right;  Adductor Block   I & D KNEE WITH POLY EXCHANGE Right 10/02/2014   Procedure: IRRIGATION AND DEBRIDEMENT RIGHT KNEE WITH POLY EXCHANGE;  Surgeon: Gearlean Alf, MD;  Location: [facility];  Service: Orthopedics;  Laterality: Right;   I & D KNEE WITH POLY EXCHANGE Right 08/27/2020   Procedure: IRRIGATION AND DEBRIDEMENT; SPACER EXCHANGE RIGHT KNEE with multiple specimens;  Surgeon: [name], MD;  Location: [facility];  Service: Orthopedics;  Laterality: Right;  39mn   INCISION AND DRAINAGE OF WOUND Right 01/14/2017   Procedure: IRRIGATION AND DEBRIDEMENT WOUND;  Surgeon: FGaynelle Arabian MD;  Location: [facility];  Service: Orthopedics;  Laterality: Right;  requests 485ms   JOINT REPLACEMENT  06/2014   right knee   nasal cauterization  2012   PATELLAR TENDON REPAIR Right 08/11/2014   Procedure: RIGHT PATELLA TENDON REPAIR;  Surgeon: FrGearlean AlfMD;  Location: [facility];  Service: Orthopedics;  Laterality: Right;   REIMPLANTATION OF TOTAL KNEE Right 05/23/2015   Procedure: RIGHT KNEE ARTHROPLASTY REIMPLANTATION;  Surgeon: FrGaynelle Arabian MD;  Location: [facility];  Service: Orthopedics;  Laterality: Right;   TOTAL KNEE ARTHROPLASTY Right 07/03/2014   Procedure: RIGHT TOTAL KNEE ARTHROPLASTY;  Surgeon: FrGearlean AlfMD;  Location: [facility];  Service: Orthopedics;  Laterality: Right;   TUBAL LIGATION     Family History  Problem Relation Age of Onset   Ovarian cancer Mother    Cancer Mother    Peripheral vascular disease Father        with amputation of both legs   Hypertension Father    Heart disease Brother [id number] Kidney disease Daughter    Heart disease Daughter [id number] Diabetes Son    Diabetes Son    Colon cancer Neg Hx    Esophageal cancer Neg Hx    Stomach cancer  Neg Hx    Rectal cancer Neg Hx    Social History   Socioeconomic History   Marital status: Widowed    Spouse name: Not on file   Number of children: 9   Years of education: 8th   Highest education level: 8th grade  Occupational History    Employer: RETIRED  Tobacco Use   Smoking status: Never   Smokeless tobacco: Never  Vaping Use   Vaping Use: Never used  Substance and Sexual Activity   Alcohol use: No   Drug use: No   Sexual activity: Not Currently  Other Topics Concern   Not on file  Social History Narrative   She has 6 living adult children and 3 who have passed away and many grandchildren and great grandchildren. Patient is retired. 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    One child in [place], others in [place], [place], [place], and [place]   Social Determinants of Health   Financial Resource Strain: Low Risk  (12/15/2022)   Overall Financial Resource Strain (CARDIA)    Difficulty of Paying Living Expenses: Not hard at all  Food Insecurity: No Food Insecurity (12/15/2022)   Hunger Vital Sign    Worried About Running Out of Food in the Last Year: Never true    Ran Out of Food in the Last Year: Never true  Transportation Needs: No Transportation Needs (01/01/2022)   PRAPARE - Hydrologist (Medical): No     Lack of Transportation (Non-Medical): No  Physical Activity: Insufficiently Active (12/15/2022)   Exercise Vital Sign    Days of Exercise per Week: 3 days    Minutes of Exercise per Session: 30 min  Stress: No Stress Concern Present (12/15/2022)   [place]    Feeling of Stress : Not at all  Social Connections: Moderately Integrated (12/15/2022)   Social Connection and Isolation Panel [NHANES]    Frequency of Communication with Friends and Family: More than three times a week    Frequency of Social Gatherings with Friends and Family: More than three times a week    Attends Religious Services: More than 4 times per year    Active Member of [employer] or Organizations: Yes    Attends Music therapist: More than 4 times per year    Marital Status: Never married    Tobacco Counseling Counseling given: Not Answered   Clinical Intake:  Pre-visit preparation completed: Yes  Pain : No/denies pain     Nutritional Risks: None Diabetes: No  How often do you need to have someone help you when you read instructions, pamphlets, or other written materials from your doctor or pharmacy?: 1 - Never  Diabetic?no   Interpreter Needed?: No  Information entered by :: Jadene Pierini, LPN   Activities of Daily Living    12/15/2022    2:49 PM  In your present state of health, do you have any difficulty performing the following activities:  Hearing? 0  Vision? 0  Difficulty concentrating or making decisions? 0  Walking or climbing stairs? 0  Dressing or bathing? 0  Doing errands, shopping? 0  Preparing Food and eating ? N  Using the Toilet? N  In the past six months, have you accidently leaked urine? N  Do you have problems with loss of bowel control? N  Managing your Medications? N  Managing your Finances? N  Housekeeping or managing your Housekeeping? N  Patient Care Team: Claretta Fraise, MD as PCP - General  (Family Medicine) Claretta Fraise, MD (Family Medicine) [name], MD as Consulting Physician (Orthopedic Surgery)  Indicate any recent Medical Services you may have received from other than [facility] providers in the past year (date may be approximate).     Assessment:   This is a routine wellness examination for [place].  Hearing/Vision screen Vision Screening - Comments:: Wears rx glasses - up to date with routine eye exams with  Dr.johnson   Dietary issues and exercise activities discussed: Current Exercise Habits: Home exercise routine, Type of exercise: strength training/weights, Time (Minutes): 30, Frequency (Times/Week): 3, Weekly Exercise (Minutes/Week): 90, Intensity: Mild, Exercise limited by: orthopedic condition(s)   Goals Addressed             This Visit's Progress    Exercise 3x per week (30 min per time)         Depression Screen    12/15/2022    2:48 PM 06/17/2022    4:06 PM 06/17/2022    4:00 PM 01/20/2022    1:32 PM 12/12/2021    3:28 PM 12/11/2021    2:20 PM 06/06/2021    2:54 PM  PHQ 2/9 Scores  PHQ - 2 Score 0 0 0 0 0 1 1  PHQ- 9 Score  2   0  2    Fall Risk    12/15/2022    2:47 PM 06/17/2022    4:06 PM 06/17/2022    4:00 PM 01/20/2022    1:31 PM 12/12/2021    3:28 PM  Fall Risk   Falls in the past year? 1 1 0 0 0  Number falls in past yr: 1 0  0   Injury with Fall? 1 0  0   Risk for fall due to : History of fall(s);Impaired balance/gait;Orthopedic patient History of fall(s);Impaired balance/gait;Impaired mobility     Follow up Education provided;Falls prevention discussed Falls evaluation completed       FALL RISK PREVENTION PERTAINING TO THE HOME:  Any stairs in or around the home? No  If so, are there any without handrails? No  Home free of loose throw rugs in walkways, pet beds, electrical cords, etc? Yes  Adequate lighting in your home to reduce risk of falls? Yes   ASSISTIVE DEVICES UTILIZED TO PREVENT FALLS:  Life alert? No  Use of a cane,  walker or w/c? Yes  Grab bars in the bathroom? Yes  Shower chair or bench in shower? Yes  Elevated toilet seat or a handicapped toilet? Yes       12/30/2017    2:54 PM 12/16/2016    4:07 PM 08/10/2015    3:58 PM  MMSE - Mini Mental State Exam  Orientation to time '5 5 5  '$ Orientation to Place '5 5 5  '$ Registration '3 3 3  '$ Attention/ Calculation '5 5 5  '$ Recall '3 3 3  '$ Language- name 2 objects '2 2 2  '$ Language- repeat '1 1 1  '$ Language- follow 3 step command '3 3 3  '$ Language- read & follow direction '1 1 1  '$ Write a sentence '1 1 1  '$ Copy design 1 0 1  Total score '30 29 30        '$ 12/15/2022    2:49 PM 04/02/2020   11:45 AM  6CIT Screen  What Year? 0 points 0 points  What month? 0 points 0 points  What time? 0 points 0 points  Count back from 20 0  points 0 points  Months in reverse 0 points 2 points  Repeat phrase 0 points 0 points  Total Score 0 points 2 points    Immunizations Immunization History  Administered Date(s) Administered   Fluad Quad(high Dose 65+) 10/30/2020   Influenza, High Dose Seasonal PF 08/27/2016, 09/28/2018, 09/29/2019   Influenza,inj,Quad PF,6+ Mos 11/19/2015   Influenza-Unspecified 10/23/2021   Moderna Sars-Covid-2 Vaccination 01/12/2020, 02/13/2020   PNEUMOCOCCAL CONJUGATE-20 12/12/2021   Tdap 11/03/2006    TDAP status: Due, Education has been provided regarding the importance of this vaccine. Advised may receive this vaccine at local pharmacy or Health Dept. Aware to provide a copy of the vaccination record if obtained from local pharmacy or Health Dept. Verbalized acceptance and understanding.  Flu Vaccine status: Due, Education has been provided regarding the importance of this vaccine. Advised may receive this vaccine at local pharmacy or Health Dept. Aware to provide a copy of the vaccination record if obtained from local pharmacy or Health Dept. Verbalized acceptance and understanding.  Pneumococcal vaccine status: Up to date  Covid-19 vaccine status:  Completed vaccines  Qualifies for Shingles Vaccine? Yes   Zostavax completed No   Shingrix Completed?: No.    Education has been provided regarding the importance of this vaccine. Patient has been advised to call insurance company to determine out of pocket expense if they have not yet received this vaccine. Advised may also receive vaccine at local pharmacy or Health Dept. Verbalized acceptance and understanding.  Screening Tests Health Maintenance  Topic Date Due   DTaP/Tdap/Td (2 - Td or Tdap) 11/03/2016   Diabetic kidney evaluation - Urine ACR  09/29/2019   DEXA SCAN  10/09/2020   INFLUENZA VACCINE  07/08/2022   COVID-19 Vaccine (3 - 2023-24 season) 08/08/2022   FOOT EXAM  12/12/2022   Zoster Vaccines- Shingrix (1 of 2) 03/13/2023 (Originally 12/24/1993)   OPHTHALMOLOGY EXAM  12/16/2022   HEMOGLOBIN A1C  12/18/2022   Diabetic kidney evaluation - eGFR measurement  06/18/2023   Medicare Annual Wellness (AWV)  12/16/2023   Pneumonia Vaccine 27+ Years old  Completed   Hepatitis C Screening  Completed   HPV VACCINES  Aged Out   COLONOSCOPY (Pts 45-33yr Insurance coverage will need to be confirmed)  Discontinued    Health Maintenance  Health Maintenance Due  Topic Date Due   DTaP/Tdap/Td (2 - Td or Tdap) 11/03/2016   Diabetic kidney evaluation - Urine ACR  09/29/2019   DEXA SCAN  10/09/2020   INFLUENZA VACCINE  07/08/2022   COVID-19 Vaccine (3 - 2023-24 season) 08/08/2022   FOOT EXAM  12/12/2022    Colorectal cancer screening: No longer required.   Mammogram status: No longer required due to age.  Bone Density status: Ordered 010/07/2023. Pt provided with contact info and advised to call to schedule appt.  Lung Cancer Screening: (Low Dose CT Chest recommended if Age 79-80years, 30 pack-year currently smoking OR have quit w/in 15years.) does not qualify.   Lung Cancer Screening Referral: n/a  Additional Screening:  Hepatitis C Screening: does not qualify;  Vision  Screening: Recommended annual ophthalmology exams for early detection of glaucoma and other disorders of the eye. Is the patient up to date with their annual eye exam?  Yes  Who is the provider or what is the name of the office in which the patient attends annual eye exams? Dr.Johnson  If pt is not established with a provider, would they like to be referred to a provider to establish care? No .  Dental Screening: Recommended annual dental exams for proper oral hygiene  Community Resource Referral / Chronic Care Management: CRR required this visit?  No   CCM required this visit?  No      Plan:     I have personally reviewed and noted the following in the patient's chart:   Medical and social history Use of alcohol, tobacco or illicit drugs  Current medications and supplements including opioid prescriptions. Patient is not currently taking opioid prescriptions. Functional ability and status Nutritional status Physical activity Advanced directives List of other physicians Hospitalizations, surgeries, and ER visits in previous 12 months Vitals Screenings to include cognitive, depression, and falls Referrals and appointments  In addition, I have reviewed and discussed with patient certain preventive protocols, quality metrics, and best practice recommendations. A written personalized care plan for preventive services as well as general preventive health recommendations were provided to patient.     Daphane Shepherd, LPN   05/16/6167   Nurse Notes: Due Flu /TDAP Vaccine

## 2022-12-15 NOTE — Patient Instructions (Signed)
Beth [name] , Thank you for taking time to come for your Medicare Wellness Visit. I appreciate your ongoing commitment to your health goals. Please review the following plan we discussed and let me know if I can assist you in the future.   These are the goals we discussed:  Goals      AWV     04/02/2020 AWV Goal: Fall Prevention  Over the next year, patient will decrease their risk for falls by: Using assistive devices, such as a cane or walker, as needed Identifying fall risks within their home and correcting them by: Removing throw rugs Adding handrails to stairs or ramps Removing clutter and keeping a clear pathway throughout the home Increasing light, especially at night Adding shower handles/bars Raising toilet seat Identifying potential personal risk factors for falls: Medication side effects Incontinence/urgency Vestibular dysfunction Hearing loss Musculoskeletal disorders Neurological disorders Orthostatic hypotension  04/02/2020 AWV Goal: Diabetes Management  Patient will maintain an A1C level below 8.0 Patient will not develop any diabetic foot complications Patient will not experience any hypoglycemic episodes over the next 3 months Patient will notify our office of any CBG readings outside of the provider recommended range by calling [phone] Patient will adhere to provider recommendations for diabetes management  Patient Self Management Activities take all medications as prescribed and report any negative side effects monitor and record blood sugar readings as directed adhere to a low carbohydrate diet that incorporates lean proteins, vegetables, whole grains, low glycemic fruits check feet daily noting any sores, cracks, injuries, or callous formations see PCP or podiatrist if she notices any changes in her legs, feet, or toenails Patient will visit PCP and have an A1C level checked every 3 to 6 months as directed  have a yearly eye exam to monitor for vascular  changes associated with diabetes and will request that the report be sent to her pcp.  consult with her PCP regarding any changes in her health or new or worsening symptoms      Exercise 3x per week (30 min per time)        This is a list of the screening recommended for you and due dates:  Health Maintenance  Topic Date Due   DTaP/Tdap/Td vaccine (2 - Td or Tdap) 11/03/2016   Yearly kidney health urinalysis for diabetes  09/29/2019   DEXA scan (bone density measurement)  10/09/2020   Flu Shot  07/08/2022   COVID-19 Vaccine (3 - 2023-24 season) 08/08/2022   Complete foot exam   12/12/2022   Zoster (Shingles) Vaccine (1 of 2) 03/13/2023*   Eye exam for diabetics  12/16/2022   Hemoglobin A1C  12/18/2022   Yearly kidney function blood test for diabetes  06/18/2023   Medicare Annual Wellness Visit  12/16/2023   Pneumonia Vaccine  Completed   Hepatitis C Screening: USPSTF Recommendation to screen - Ages 18-79 yo.  Completed   HPV Vaccine  Aged Out   Colon Cancer Screening  Discontinued  *Topic was postponed. The date shown is not the original due date.    Advanced directives: Advance directive discussed with you today. I have provided a copy for you to complete at home and have notarized. Once this is complete please bring a copy in to our office so we can scan it into your chart.   Conditions/risks identified: Aim for 30 minutes of exercise or brisk walking, 6-8 glasses of water, and 5 servings of fruits and vegetables each day.   Next appointment: Follow up in  one year for your annual wellness visit    Preventive Care 65 Years and Older, Female Preventive care refers to lifestyle choices and visits with your health care provider that can promote health and wellness. What does preventive care include? A yearly physical exam. This is also called an annual well check. Dental exams once or twice a year. Routine eye exams. Ask your health care provider how often you should have your  eyes checked. Personal lifestyle choices, including: Daily care of your teeth and gums. Regular physical activity. Eating a healthy diet. Avoiding tobacco and drug use. Limiting alcohol use. Practicing safe sex. Taking low-dose aspirin every day. Taking vitamin and mineral supplements as recommended by your health care provider. What happens during an annual well check? The services and screenings done by your health care provider during your annual well check will depend on your age, overall health, lifestyle risk factors, and family history of disease. Counseling  Your health care provider may ask you questions about your: Alcohol use. Tobacco use. Drug use. Emotional well-being. Home and relationship well-being. Sexual activity. Eating habits. History of falls. Memory and ability to understand (cognition). Work and work Statistician. Reproductive health. Screening  You may have the following tests or measurements: Height, weight, and BMI. Blood pressure. Lipid and cholesterol levels. These may be checked every 5 years, or more frequently if you are over 31 years old. Skin check. Lung cancer screening. You may have this screening every year starting at age 1 if you have a 30-pack-year history of smoking and currently smoke or have quit within the past 15 years. Fecal occult blood test (FOBT) of the stool. You may have this test every year starting at age 47. Flexible sigmoidoscopy or colonoscopy. You may have a sigmoidoscopy every 5 years or a colonoscopy every 10 years starting at age 19. Hepatitis C blood test. Hepatitis B blood test. Sexually transmitted disease (STD) testing. Diabetes screening. This is done by checking your blood sugar (glucose) after you have not eaten for a while (fasting). You may have this done every 1-3 years. Bone density scan. This is done to screen for osteoporosis. You may have this done starting at age 61. Mammogram. This may be done every 1-2  years. Talk to your health care provider about how often you should have regular mammograms. Talk with your health care provider about your test results, treatment options, and if necessary, the need for more tests. Vaccines  Your health care provider may recommend certain vaccines, such as: Influenza vaccine. This is recommended every year. Tetanus, diphtheria, and acellular pertussis (Tdap, Td) vaccine. You may need a Td booster every 10 years. Zoster vaccine. You may need this after age 36. Pneumococcal 13-valent conjugate (PCV13) vaccine. One dose is recommended after age 42. Pneumococcal polysaccharide (PPSV23) vaccine. One dose is recommended after age 64. Talk to your health care provider about which screenings and vaccines you need and how often you need them. This information is not intended to replace advice given to you by your health care provider. Make sure you discuss any questions you have with your health care provider. Document Released: 12/21/2015 Document Revised: 08/13/2016 Document Reviewed: 09/25/2015 Elsevier Interactive Patient Education  2017 [employer] Prevention in the Home Falls can cause injuries. They can happen to people of all ages. There are many things you can do to make your home safe and to help prevent falls. What can I do on the outside of my home? Regularly fix the  edges of walkways and driveways and fix any cracks. Remove anything that might make you trip as you walk through a door, such as a raised step or threshold. Trim any bushes or trees on the path to your home. Use bright outdoor lighting. Clear any walking paths of anything that might make someone trip, such as rocks or tools. Regularly check to see if handrails are loose or broken. Make sure that both sides of any steps have handrails. Any raised decks and porches should have guardrails on the edges. Have any leaves, snow, or ice cleared regularly. Use sand or salt on walking paths  during winter. Clean up any spills in your garage right away. This includes oil or grease spills. What can I do in the bathroom? Use night lights. Install grab bars by the toilet and in the tub and shower. Do not use towel bars as grab bars. Use non-skid mats or decals in the tub or shower. If you need to sit down in the shower, use a plastic, non-slip stool. Keep the floor dry. Clean up any water that spills on the floor as soon as it happens. Remove soap buildup in the tub or shower regularly. Attach bath mats securely with double-sided non-slip rug tape. Do not have throw rugs and other things on the floor that can make you trip. What can I do in the bedroom? Use night lights. Make sure that you have a light by your bed that is easy to reach. Do not use any sheets or blankets that are too big for your bed. They should not hang down onto the floor. Have a firm chair that has side arms. You can use this for support while you get dressed. Do not have throw rugs and other things on the floor that can make you trip. What can I do in the kitchen? Clean up any spills right away. Avoid walking on wet floors. Keep items that you use a lot in easy-to-reach places. If you need to reach something above you, use a strong step stool that has a grab bar. Keep electrical cords out of the way. Do not use floor polish or wax that makes floors slippery. If you must use wax, use non-skid floor wax. Do not have throw rugs and other things on the floor that can make you trip. What can I do with my stairs? Do not leave any items on the stairs. Make sure that there are handrails on both sides of the stairs and use them. Fix handrails that are broken or loose. Make sure that handrails are as long as the stairways. Check any carpeting to make sure that it is firmly attached to the stairs. Fix any carpet that is loose or worn. Avoid having throw rugs at the top or bottom of the stairs. If you do have throw  rugs, attach them to the floor with carpet tape. Make sure that you have a light switch at the top of the stairs and the bottom of the stairs. If you do not have them, ask someone to add them for you. What else can I do to help prevent falls? Wear shoes that: Do not have high heels. Have rubber bottoms. Are comfortable and fit you well. Are closed at the toe. Do not wear sandals. If you use a stepladder: Make sure that it is fully opened. Do not climb a closed stepladder. Make sure that both sides of the stepladder are locked into place. Ask someone to hold it for  you, if possible. Clearly mark and make sure that you can see: Any grab bars or handrails. First and last steps. Where the edge of each step is. Use tools that help you move around (mobility aids) if they are needed. These include: Canes. Walkers. Scooters. Crutches. Turn on the lights when you go into a dark area. Replace any light bulbs as soon as they burn out. Set up your furniture so you have a clear path. Avoid moving your furniture around. If any of your floors are uneven, fix them. If there are any pets around you, be aware of where they are. Review your medicines with your doctor. Some medicines can make you feel dizzy. This can increase your chance of falling. Ask your doctor what other things that you can do to help prevent falls. This information is not intended to replace advice given to you by your health care provider. Make sure you discuss any questions you have with your health care provider. Document Released: 09/20/2009 Document Revised: 05/01/2016 Document Reviewed: 12/29/2014 Elsevier Interactive Patient Education  2017 [employer].

## 2022-12-18 ENCOUNTER — Ambulatory Visit: Payer: Medicare HMO

## 2022-12-18 ENCOUNTER — Encounter: Payer: Self-pay | Admitting: Family Medicine

## 2022-12-18 ENCOUNTER — Ambulatory Visit (INDEPENDENT_AMBULATORY_CARE_PROVIDER_SITE_OTHER): Payer: Medicare HMO | Admitting: Family Medicine

## 2022-12-18 VITALS — BP 122/70 | HR 54 | Temp 97.5°F | Ht 66.0 in | Wt 220.0 lb

## 2022-12-18 DIAGNOSIS — I1A0 Resistant hypertension: Secondary | ICD-10-CM | POA: Diagnosis not present

## 2022-12-18 DIAGNOSIS — E782 Mixed hyperlipidemia: Secondary | ICD-10-CM

## 2022-12-18 DIAGNOSIS — E119 Type 2 diabetes mellitus without complications: Secondary | ICD-10-CM

## 2022-12-18 DIAGNOSIS — M10022 Idiopathic gout, left elbow: Secondary | ICD-10-CM | POA: Diagnosis not present

## 2022-12-18 DIAGNOSIS — Z23 Encounter for immunization: Secondary | ICD-10-CM

## 2022-12-18 DIAGNOSIS — I10 Essential (primary) hypertension: Secondary | ICD-10-CM | POA: Diagnosis not present

## 2022-12-18 LAB — BAYER DCA HB A1C WAIVED: HB A1C (BAYER DCA - WAIVED): 5.7 % — ABNORMAL HIGH (ref 4.8–5.6)

## 2022-12-18 MED ORDER — DILTIAZEM HCL ER COATED BEADS 240 MG PO CP24: 240.0000 mg | ORAL_CAPSULE | Freq: Every day | ORAL | 3 refills | Status: DC

## 2022-12-18 MED ORDER — OLMESARTAN MEDOXOMIL 40 MG PO TABS: ORAL_TABLET | ORAL | 3 refills | Status: DC

## 2022-12-18 MED ORDER — COLCHICINE 0.6 MG PO TABS: ORAL_TABLET | ORAL | 2 refills | Status: DC

## 2022-12-18 MED ORDER — AMLODIPINE BESYLATE 10 MG PO TABS: 10.0000 mg | ORAL_TABLET | Freq: Every day | ORAL | 3 refills | Status: DC

## 2022-12-18 MED ORDER — METFORMIN HCL ER 500 MG PO TB24: 500.0000 mg | ORAL_TABLET | Freq: Every day | ORAL | 3 refills | Status: DC

## 2022-12-18 MED ORDER — HYDRALAZINE HCL 100 MG PO TABS
ORAL_TABLET | ORAL | 0 refills | Status: DC
Start: 2022-12-18 — End: 2023-02-19

## 2022-12-18 NOTE — Progress Notes (Signed)
Subjective:  Patient ID: Beth [name], female    DOB: 1946-04-07  Age: 77 y.o. MRN: [id number]  CC: Medical Management of Chronic Issues   HPI Beth [name] presents for  follow-up of hypertension. Patient has no history of headache chest pain or shortness of breath or recent cough. Patient also denies symptoms of TIA such as focal numbness or weakness. Patient denies side effects from medication. States taking them regularly.Multiple meds in use due to resistance to standard treatment.  presents forFollow-up of diabetes. Patient checks blood sugar at home.   120 and 140 fasting in log she brings in. Patient denies symptoms such as polyuria, polydipsia, excessive hunger, nausea No significant hypoglycemic spells noted. Medications reviewed. Pt reports taking them regularly without complication/adverse reaction being reported today.    in for follow-up of elevated cholesterol. Doing well without complaints on current medication. Denies side effects of statin including myalgia and arthralgia and nausea. Currently no chest pain, shortness of breath or other cardiovascular related symptoms noted.  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   She has a past surgical history that includes Carpal tunnel release (Right, 1983); colonscopy (June 21, 2014);  nasal cauterization (2012); Total knee arthroplasty (Right, 07/03/2014); Patellar tendon repair (Right, 08/11/2014); Joint replacement (06/2014); I & D knee with poly exchange (Right, 10/02/2014); Excisional total knee arthroplasty with antibiotic spacers (Right, 02/16/2015); Reimplantation of total knee (Right, 05/23/2015); Abdominal hysterectomy (1983); Tubal ligation; Incision and drainage of wound (Right, 01/14/2017); Excisional total knee arthroplasty with antibiotic spacers (Right, 11/03/2018); and I & D knee with poly exchange (Right, 08/27/2020).   Her family history includes Cancer in her mother; Diabetes in her son and son; Heart disease (age of onset: 52) in her daughter; Heart disease (age of onset: 98) in her brother; Hypertension in her father; Kidney disease in her daughter; Ovarian cancer in her mother; Peripheral vascular disease in her father.She reports that she has never smoked. She has never used smokeless tobacco. She reports that she does not drink alcohol and does not use drugs.  Current Outpatient Medications on File Prior to Visit  Medication Sig Dispense Refill   acetaminophen (TYLENOL) 500 MG tablet Take 2 tablets (1,000 mg total) by mouth every 6 (six) hours as needed for mild pain. 90 tablet 1   atorvastatin (LIPITOR) 20 MG tablet Take 1 tablet (20 mg total) by mouth every evening. 90 tablet 3   Blood Glucose Calibration (TRUE METRIX LEVEL 1) Low SOLN Use with glucose monitor Dx E11.9 3 each 0   Blood Glucose Monitoring Suppl (TRUE METRIX AIR GLUCOSE METER) w/Device KIT 1 Device by Does not apply route 4 (four) times daily. Dx E11.9 1 kit 0   ferrous sulfate 325 (65 FE) MG tablet Take 325 mg by mouth daily.     glucose blood (TRUE METRIX BLOOD GLUCOSE TEST) test strip check glucose up to four times daily Dx E11.9 400 strip 3   isosorbide dinitrate (ISORDIL) 10 MG tablet Take  1 tablet (10 mg total) by mouth 3 (three) times daily. 270 tablet 3   Nebivolol HCl 20 MG TABS TAKE ONE (1)  TABLET EACH DAY 90 tablet 3   Omega-3 Fatty Acids (FISH OIL) 1000 MG CAPS Take 1,000 mg by mouth. Take one tablet by mouth 2 to 3 times weekly     TRUEplus Lancets 33G MISC Test QID Dx E11.9 400 each 3   Current Facility-Administered Medications on File Prior to Visit  Medication Dose Route Frequency Provider Last Rate Last Admin   tranexamic acid (CYKLOKAPRON) 2,000 mg in sodium chloride 0.9 % 50 mL Topical Application  0,762 mg Topical Once [name], MD        ROS Review of Systems  Constitutional: Negative.   HENT: Negative.    Eyes:  Negative for visual disturbance.  Respiratory:  Negative for shortness of breath.   Cardiovascular:  Negative for chest pain.  Gastrointestinal:  Negative for abdominal pain.  Musculoskeletal:  Positive for arthralgias (gout left elbow for two weeks.).    Objective:  BP 122/70   Pulse (!) 54   Temp (!) 97.5 F (36.4 C)   Ht '5\' 6"'$  (1.676 m)   Wt 220 lb (99.8 kg) Comment: patient reported  SpO2 95%   BMI 35.51 kg/m   BP Readings from Last 3 Encounters:  12/18/22 122/70  08/07/22 (!) 134/41  06/17/22 126/69    Wt Readings from Last 3 Encounters:  12/18/22 220 lb (99.8 kg)  12/15/22 220 lb (99.8 kg)  12/11/21 230 lb (104.3 kg)     Physical Exam Constitutional:      General: She is not in acute distress.    Appearance: She is well-developed.  Cardiovascular:     Rate and Rhythm: Normal rate and regular rhythm.  Pulmonary:     Breath sounds: Normal breath sounds.  Musculoskeletal:        General: Normal range of motion.  Skin:    General: Skin is warm and dry.  Neurological:     Mental Status: She is alert and oriented to person, place, and time.       Assessment & Plan:   Beth [name] was seen today for medical management of chronic issues.  Diagnoses and all orders for this visit:  Controlled type 2 diabetes mellitus without complication, without long-term current use of insulin (HCC) -     Bayer DCA Hb A1c  Waived  Mixed hyperlipidemia -     Lipid panel  Resistant hypertension -     CBC with Differential/Platelet -     CMP14+EGFR  Acute idiopathic gout of left elbow -     Uric acid  Need for immunization against influenza -     Flu Vaccine QUAD High Dose(Fluad)  Other orders -     amLODipine (NORVASC) 10 MG tablet; Take 1 tablet (10 mg total) by mouth daily. -     diltiazem (CARDIZEM CD) 240 MG 24 hr capsule; Take 1 capsule (240 mg total) by mouth daily. -     hydrALAZINE (APRESOLINE) 100 MG tablet; TAKE ONE (1) TABLET THREE (3) TIMES EACH DAY -     metFORMIN (GLUCOPHAGE-XR) 500 MG 24 hr tablet; Take 1 tablet (500 mg total) by mouth daily. -     olmesartan (BENICAR) 40 MG tablet; TAKE ONE (1) TABLET EACH DAY -     colchicine 0.6 MG tablet; Take twice daily for gout attack. (may take every two hours up to 6 doses at acute onset)  Allergies as of 12/18/2022       Reactions   Aleve [naproxen Sodium] Other (See Comments)   Heart races   Asa [aspirin] Other (See Comments)   Nose bleeding   Ciprofloxacin Other (See Comments)   Possible hamstring tendinopathy   Clonidine Derivatives Other (See Comments)   Dizziness and weakness   Shellfish Allergy Nausea And Vomiting        Medication List        Accurate as of December 18, 2022  4:32 PM. If you have any questions, ask your nurse or doctor.          acetaminophen 500 MG tablet Commonly known as: TYLENOL Take 2 tablets (1,000 mg total) by mouth every 6 (six) hours as needed for mild pain.   amLODipine 10 MG tablet Commonly known as: NORVASC Take 1 tablet (10 mg total) by mouth daily.   atorvastatin 20 MG tablet Commonly known as: LIPITOR Take 1 tablet (20 mg total) by mouth every evening.   colchicine 0.6 MG tablet Take twice daily for gout attack. (may take every two hours up to 6 doses at acute onset) Started by: Claretta Fraise, MD   diltiazem 240 MG 24 hr capsule Commonly known as: CARDIZEM CD Take 1 capsule  (240 mg total) by mouth daily.   ferrous sulfate 325 (65 FE) MG tablet Take 325 mg by mouth daily.   Fish Oil 1000 MG Caps Take 1,000 mg by mouth. Take one tablet by mouth 2 to 3 times weekly   hydrALAZINE 100 MG tablet Commonly known as: APRESOLINE TAKE ONE (1) TABLET THREE (3) TIMES EACH DAY   isosorbide dinitrate 10 MG tablet Commonly known as: ISORDIL Take 1 tablet (10 mg total) by mouth 3 (three) times daily.   metFORMIN 500 MG 24 hr tablet Commonly known as: GLUCOPHAGE-XR Take 1 tablet (500 mg total) by mouth daily.   Nebivolol HCl 20 MG Tabs TAKE ONE (1) TABLET EACH DAY   olmesartan 40 MG tablet Commonly known as: BENICAR TAKE ONE (1) TABLET EACH DAY   True Metrix Air Glucose Meter w/Device Kit 1 Device by Does not apply route 4 (four) times daily. Dx E11.9   True Metrix Blood Glucose Test test strip Generic drug: glucose blood check glucose up to four times daily Dx E11.9   True Metrix Level 1 Low Soln Use with glucose monitor Dx E11.9   TRUEplus Lancets 33G Misc Test QID Dx E11.9        Meds ordered this encounter  Medications   amLODipine (NORVASC) 10 MG tablet    Sig: Take 1 tablet (10 mg total) by mouth daily.    Dispense:  90 tablet    Refill:  3   diltiazem (CARDIZEM CD) 240 MG 24 hr capsule    Sig: Take 1 capsule (240 mg total) by mouth daily.    Dispense:  90 capsule    Refill:  3   hydrALAZINE (APRESOLINE) 100 MG tablet    Sig: TAKE ONE (1) TABLET THREE (3) TIMES EACH DAY    Dispense:  270 tablet    Refill:  0   metFORMIN (GLUCOPHAGE-XR) 500 MG 24 hr tablet    Sig: Take 1 tablet (500 mg total) by mouth daily.    Dispense:  90 tablet    Refill:  3   olmesartan (BENICAR) 40 MG tablet    Sig: TAKE ONE (1) TABLET EACH DAY    Dispense:  90 tablet    Refill:  3   colchicine 0.6 MG tablet    Sig: Take twice daily for gout attack. (may take every two hours up to 6 doses at acute onset)    Dispense:  60 tablet    Refill:  2    Declined  gout prevention to be added at follow up due to number of meds she already has to take.  Follow-up: Return in about 3 months (around 03/19/2023).  Claretta Fraise, M.D.

## 2022-12-19 LAB — CMP14+EGFR
ALT: 8 IU/L (ref 0–32)
AST: 14 IU/L (ref 0–40)
Albumin/Globulin Ratio: 1.4 (ref 1.2–2.2)
Albumin: 4.1 g/dL (ref 3.8–4.8)
Alkaline Phosphatase: 123 IU/L — ABNORMAL HIGH (ref 44–121)
BUN/Creatinine Ratio: 23 (ref 12–28)
BUN: 30 mg/dL — ABNORMAL HIGH (ref 8–27)
Bilirubin Total: 0.7 mg/dL (ref 0.0–1.2)
CO2: 20 mmol/L (ref 20–29)
Calcium: 9.5 mg/dL (ref 8.7–10.3)
Chloride: 103 mmol/L (ref 96–106)
Creatinine, Ser: 1.32 mg/dL — ABNORMAL HIGH (ref 0.57–1.00)
Globulin, Total: 3 g/dL (ref 1.5–4.5)
Glucose: 108 mg/dL — ABNORMAL HIGH (ref 70–99)
Potassium: 5 mmol/L (ref 3.5–5.2)
Sodium: 138 mmol/L (ref 134–144)
Total Protein: 7.1 g/dL (ref 6.0–8.5)
eGFR: 41 mL/min/{1.73_m2} — ABNORMAL LOW (ref >59)

## 2022-12-19 LAB — CBC WITH DIFFERENTIAL/PLATELET
Basophils Absolute: 0.1 10*3/uL (ref 0.0–0.2)
Basos: 1 %
EOS (ABSOLUTE): 0.3 10*3/uL (ref 0.0–0.4)
Eos: 4 %
Hematocrit: 34.8 % (ref 34.0–46.6)
Hemoglobin: 11.2 g/dL (ref 11.1–15.9)
Immature Grans (Abs): 0 10*3/uL (ref 0.0–0.1)
Immature Granulocytes: 0 %
Lymphocytes Absolute: 1.3 10*3/uL (ref 0.7–3.1)
Lymphs: 23 %
MCH: 26.1 pg — ABNORMAL LOW (ref 26.6–33.0)
MCHC: 32.2 g/dL (ref 31.5–35.7)
MCV: 81 fL (ref 79–97)
Monocytes Absolute: 0.6 10*3/uL (ref 0.1–0.9)
Monocytes: 11 %
Neutrophils Absolute: 3.5 10*3/uL (ref 1.4–7.0)
Neutrophils: 61 %
Platelets: 335 10*3/uL (ref 150–450)
RBC: 4.29 x10E6/uL (ref 3.77–5.28)
RDW: 14.6 % (ref 11.7–15.4)
WBC: 5.6 10*3/uL (ref 3.4–10.8)

## 2022-12-19 LAB — LIPID PANEL
Chol/HDL Ratio: 2.7 ratio (ref 0.0–4.4)
Cholesterol, Total: 152 mg/dL (ref 100–199)
HDL: 56 mg/dL (ref >39)
LDL Chol Calc (NIH): 81 mg/dL (ref 0–99)
Triglycerides: 75 mg/dL (ref 0–149)
VLDL Cholesterol Cal: 15 mg/dL (ref 5–40)

## 2022-12-20 LAB — URIC ACID: Uric Acid: 6.3 mg/dL (ref 3.1–7.9)

## 2022-12-20 LAB — SPECIMEN STATUS REPORT

## 2022-12-22 NOTE — Progress Notes (Signed)
Hello Smith,  Your lab result is normal and/or stable.Some minor variations that are not significant are commonly marked abnormal, but do not represent any medical problem for you.  Best regards, Peyson Delao, M.D.

## 2023-01-13 DIAGNOSIS — M79676 Pain in unspecified toe(s): Secondary | ICD-10-CM | POA: Diagnosis not present

## 2023-01-13 DIAGNOSIS — L84 Corns and callosities: Secondary | ICD-10-CM | POA: Diagnosis not present

## 2023-01-13 DIAGNOSIS — E1142 Type 2 diabetes mellitus with diabetic polyneuropathy: Secondary | ICD-10-CM | POA: Diagnosis not present

## 2023-01-13 DIAGNOSIS — B351 Tinea unguium: Secondary | ICD-10-CM | POA: Diagnosis not present

## 2023-02-19 ENCOUNTER — Other Ambulatory Visit: Payer: Self-pay | Admitting: Family Medicine

## 2023-04-17 DIAGNOSIS — J209 Acute bronchitis, unspecified: Secondary | ICD-10-CM | POA: Diagnosis not present

## 2023-04-17 DIAGNOSIS — R059 Cough, unspecified: Secondary | ICD-10-CM | POA: Diagnosis not present

## 2023-04-17 DIAGNOSIS — Z20822 Contact with and (suspected) exposure to covid-19: Secondary | ICD-10-CM | POA: Diagnosis not present

## 2023-05-07 ENCOUNTER — Telehealth: Payer: Self-pay | Admitting: Family Medicine

## 2023-05-07 NOTE — Telephone Encounter (Signed)
Patient scheduled for Monday 6/3 with DOD.

## 2023-05-07 NOTE — Telephone Encounter (Signed)
Unfortunately, this would have to be an in office visit. There is no way to examine the knee or fit a brace without the patient present.

## 2023-05-11 ENCOUNTER — Encounter: Payer: Self-pay | Admitting: Family Medicine

## 2023-05-11 ENCOUNTER — Ambulatory Visit (INDEPENDENT_AMBULATORY_CARE_PROVIDER_SITE_OTHER): Payer: Medicare HMO | Admitting: Family Medicine

## 2023-05-11 VITALS — BP 137/63 | HR 59 | Ht 66.0 in

## 2023-05-11 DIAGNOSIS — M1711 Unilateral primary osteoarthritis, right knee: Secondary | ICD-10-CM

## 2023-05-11 NOTE — Progress Notes (Signed)
BP 137/63   Pulse (!) 59   Ht 5\' 6"  (1.676 m)   SpO2 93%   BMI 35.51 kg/m    Subjective:   Patient ID: Beth [name], female    DOB: [birth year], 77 y.o.   MRN: [id number]  HPI: Beth [name] is a 77 y.o. female presenting on 05/11/2023 for Brace for right knee   HPI Right knee pain Patient is coming in today for right knee pain.  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  Relevant past medical, surgical, family and social history reviewed and updated as indicated. Interim medical history since our last visit reviewed. Allergies and medications reviewed and updated.  Review of Systems  Constitutional:  Negative for chills and fever.  Eyes:  Negative for visual disturbance.  Respiratory:  Negative for chest tightness and shortness of breath.   Cardiovascular:  Negative for chest pain and leg swelling.  Musculoskeletal:  Positive for arthralgias and myalgias. Negative for back pain and gait problem.  Skin:  Negative for rash.  Neurological:  Negative for light-headedness and headaches.  Psychiatric/Behavioral:  Negative for agitation and behavioral problems.   All other systems reviewed and are negative.   Per HPI unless specifically indicated above   Allergies as of 05/11/2023       Reactions   Aleve [naproxen Sodium] Other (See Comments)   Heart races   Asa [aspirin] Other (See Comments)   Nose bleeding   Ciprofloxacin Other (See Comments)   Possible hamstring tendinopathy   Clonidine Derivatives Other (See Comments)   Dizziness and weakness   Shellfish Allergy Nausea And Vomiting        Medication List        Accurate as of May 11, 2023  3:29 PM. If you have any questions, ask your nurse or doctor.          acetaminophen  500 MG tablet Commonly known as: TYLENOL Take 2 tablets (1,000 mg total) by mouth every 6 (six) hours as needed for mild pain.   amLODipine 10 MG tablet Commonly known as: NORVASC Take 1 tablet (10 mg total) by mouth daily.   atorvastatin 20 MG tablet Commonly known as: LIPITOR Take 1 tablet (20 mg total) by mouth every evening.   colchicine 0.6 MG tablet Take twice daily for gout attack. (may take every two hours up to 6 doses at acute onset)   diltiazem 240 MG 24 hr capsule Commonly known as: CARDIZEM CD Take 1 capsule (240 mg total) by mouth daily.   ferrous sulfate 325 (65 FE) MG tablet Take 325 mg by mouth daily.   Fish Oil 1000 MG Caps Take 1,000 mg by mouth. Take one tablet by mouth 2 to 3 times weekly   hydrALAZINE 100 MG tablet Commonly known as: APRESOLINE Take 1 tablet (100 mg total) by mouth 3 (three) times daily. (NEEDS TO BE SEEN BEFORE NEXT REFILL)   isosorbide dinitrate 10 MG tablet Commonly known as: ISORDIL Take 1 tablet (10 mg total) by mouth 3 (three) times daily.   metFORMIN 500 MG 24 hr tablet Commonly known as: GLUCOPHAGE-XR Take 1 tablet (500 mg total) by mouth daily.   Nebivolol HCl 20 MG Tabs TAKE ONE (1)  TABLET EACH DAY   olmesartan 40 MG tablet Commonly known as: BENICAR TAKE ONE (1) TABLET EACH DAY   True Metrix Air Glucose Meter w/Device Kit 1 Device by Does not apply route 4 (four) times daily. Dx E11.9   True Metrix Blood Glucose Test test strip Generic drug: glucose blood check glucose up to four times daily Dx E11.9   True Metrix Level 1 Low Soln Use with glucose monitor Dx E11.9   TRUEplus Lancets 33G Misc Test QID Dx E11.9               Durable Medical Equipment  (From admission, onward)           Start     Ordered   05/11/23 0000  For home use only DME Other see comment       Comments: Right knee brace, sized to fit Diagnosis arthritis of the right knee  Question:  Length of Need  Answer:  Lifetime    05/11/23 1525             Objective:   BP 137/63   Pulse (!) 59   Ht 5\' 6"  (1.676 m)   SpO2 93%   BMI 35.51 kg/m   Wt Readings from Last 3 Encounters:  12/18/22 220 lb (99.8 kg)  12/15/22 220 lb (99.8 kg)  12/11/21 230 lb (104.3 kg)    Physical Exam Vitals and nursing note reviewed.  Constitutional:      Appearance: Normal appearance.  Musculoskeletal:     Right knee: Swelling present. Decreased range of motion. Tenderness present over the medial joint line and lateral joint line.  Neurological:     Mental Status: She is alert.       Assessment & Plan:   Problem List Items Addressed This Visit   None Visit Diagnoses     Arthritis of right knee    -  Primary   Relevant Orders   For home use only DME Other see comment       Sent an order for knee brace Follow up plan: Return if symptoms worsen or fail to improve.  Counseling provided for all of the vaccine components Orders Placed This Encounter  Procedures   For home use only DME Other see comment    Arville Care, MD [facility] 05/11/2023, 3:29 PM

## 2023-05-12 DIAGNOSIS — M79676 Pain in unspecified toe(s): Secondary | ICD-10-CM | POA: Diagnosis not present

## 2023-05-12 DIAGNOSIS — B351 Tinea unguium: Secondary | ICD-10-CM | POA: Diagnosis not present

## 2023-05-12 DIAGNOSIS — L84 Corns and callosities: Secondary | ICD-10-CM | POA: Diagnosis not present

## 2023-05-12 DIAGNOSIS — E1142 Type 2 diabetes mellitus with diabetic polyneuropathy: Secondary | ICD-10-CM | POA: Diagnosis not present

## 2023-07-08 DIAGNOSIS — R7989 Other specified abnormal findings of blood chemistry: Secondary | ICD-10-CM | POA: Diagnosis not present

## 2023-07-08 DIAGNOSIS — R2242 Localized swelling, mass and lump, left lower limb: Secondary | ICD-10-CM | POA: Diagnosis not present

## 2023-07-08 DIAGNOSIS — I440 Atrioventricular block, first degree: Secondary | ICD-10-CM | POA: Diagnosis not present

## 2023-07-08 DIAGNOSIS — R001 Bradycardia, unspecified: Secondary | ICD-10-CM | POA: Diagnosis not present

## 2023-07-08 DIAGNOSIS — I110 Hypertensive heart disease with heart failure: Secondary | ICD-10-CM | POA: Diagnosis not present

## 2023-07-08 DIAGNOSIS — R0989 Other specified symptoms and signs involving the circulatory and respiratory systems: Secondary | ICD-10-CM | POA: Diagnosis not present

## 2023-07-08 DIAGNOSIS — I340 Nonrheumatic mitral (valve) insufficiency: Secondary | ICD-10-CM | POA: Diagnosis not present

## 2023-07-08 DIAGNOSIS — E119 Type 2 diabetes mellitus without complications: Secondary | ICD-10-CM | POA: Diagnosis not present

## 2023-07-08 DIAGNOSIS — I501 Left ventricular failure: Secondary | ICD-10-CM | POA: Diagnosis not present

## 2023-07-08 DIAGNOSIS — I5033 Acute on chronic diastolic (congestive) heart failure: Secondary | ICD-10-CM | POA: Diagnosis not present

## 2023-07-08 DIAGNOSIS — E669 Obesity, unspecified: Secondary | ICD-10-CM | POA: Diagnosis not present

## 2023-07-08 DIAGNOSIS — I517 Cardiomegaly: Secondary | ICD-10-CM | POA: Diagnosis not present

## 2023-07-08 DIAGNOSIS — Z743 Need for continuous supervision: Secondary | ICD-10-CM | POA: Diagnosis not present

## 2023-07-08 DIAGNOSIS — E875 Hyperkalemia: Secondary | ICD-10-CM | POA: Diagnosis not present

## 2023-07-08 DIAGNOSIS — Z6841 Body Mass Index (BMI) 40.0 and over, adult: Secondary | ICD-10-CM | POA: Diagnosis not present

## 2023-07-08 DIAGNOSIS — I499 Cardiac arrhythmia, unspecified: Secondary | ICD-10-CM | POA: Diagnosis not present

## 2023-07-08 DIAGNOSIS — I878 Other specified disorders of veins: Secondary | ICD-10-CM | POA: Diagnosis not present

## 2023-07-08 DIAGNOSIS — E785 Hyperlipidemia, unspecified: Secondary | ICD-10-CM | POA: Diagnosis not present

## 2023-07-08 DIAGNOSIS — R600 Localized edema: Secondary | ICD-10-CM | POA: Diagnosis not present

## 2023-07-08 DIAGNOSIS — R2243 Localized swelling, mass and lump, lower limb, bilateral: Secondary | ICD-10-CM | POA: Diagnosis not present

## 2023-07-08 DIAGNOSIS — I872 Venous insufficiency (chronic) (peripheral): Secondary | ICD-10-CM | POA: Diagnosis not present

## 2023-07-08 DIAGNOSIS — R0902 Hypoxemia: Secondary | ICD-10-CM | POA: Diagnosis not present

## 2023-07-08 DIAGNOSIS — M79605 Pain in left leg: Secondary | ICD-10-CM | POA: Diagnosis not present

## 2023-07-08 DIAGNOSIS — Z20822 Contact with and (suspected) exposure to covid-19: Secondary | ICD-10-CM | POA: Diagnosis not present

## 2023-07-08 DIAGNOSIS — I5030 Unspecified diastolic (congestive) heart failure: Secondary | ICD-10-CM | POA: Diagnosis not present

## 2023-07-08 DIAGNOSIS — I4430 Unspecified atrioventricular block: Secondary | ICD-10-CM | POA: Diagnosis not present

## 2023-07-08 DIAGNOSIS — R5383 Other fatigue: Secondary | ICD-10-CM | POA: Diagnosis not present

## 2023-07-08 DIAGNOSIS — R0603 Acute respiratory distress: Secondary | ICD-10-CM | POA: Diagnosis not present

## 2023-07-08 DIAGNOSIS — I10 Essential (primary) hypertension: Secondary | ICD-10-CM | POA: Diagnosis not present

## 2023-07-08 DIAGNOSIS — D509 Iron deficiency anemia, unspecified: Secondary | ICD-10-CM | POA: Diagnosis not present

## 2023-07-08 DIAGNOSIS — R54 Age-related physical debility: Secondary | ICD-10-CM | POA: Diagnosis not present

## 2023-07-08 DIAGNOSIS — M7989 Other specified soft tissue disorders: Secondary | ICD-10-CM | POA: Diagnosis not present

## 2023-07-08 DIAGNOSIS — I2589 Other forms of chronic ischemic heart disease: Secondary | ICD-10-CM | POA: Diagnosis not present

## 2023-07-08 DIAGNOSIS — R0602 Shortness of breath: Secondary | ICD-10-CM | POA: Diagnosis not present

## 2023-07-08 DIAGNOSIS — I081 Rheumatic disorders of both mitral and tricuspid valves: Secondary | ICD-10-CM | POA: Diagnosis not present

## 2023-07-08 DIAGNOSIS — I509 Heart failure, unspecified: Secondary | ICD-10-CM | POA: Diagnosis not present

## 2023-07-08 DIAGNOSIS — N179 Acute kidney failure, unspecified: Secondary | ICD-10-CM | POA: Diagnosis not present

## 2023-07-08 DIAGNOSIS — I2489 Other forms of acute ischemic heart disease: Secondary | ICD-10-CM | POA: Diagnosis not present

## 2023-07-12 DIAGNOSIS — I6523 Occlusion and stenosis of bilateral carotid arteries: Secondary | ICD-10-CM | POA: Diagnosis not present

## 2023-07-12 DIAGNOSIS — Z7984 Long term (current) use of oral hypoglycemic drugs: Secondary | ICD-10-CM | POA: Diagnosis not present

## 2023-07-12 DIAGNOSIS — Z8673 Personal history of transient ischemic attack (TIA), and cerebral infarction without residual deficits: Secondary | ICD-10-CM | POA: Diagnosis not present

## 2023-07-12 DIAGNOSIS — G4489 Other headache syndrome: Secondary | ICD-10-CM | POA: Diagnosis not present

## 2023-07-12 DIAGNOSIS — E119 Type 2 diabetes mellitus without complications: Secondary | ICD-10-CM | POA: Diagnosis not present

## 2023-07-12 DIAGNOSIS — G9389 Other specified disorders of brain: Secondary | ICD-10-CM | POA: Diagnosis not present

## 2023-07-12 DIAGNOSIS — I6782 Cerebral ischemia: Secondary | ICD-10-CM | POA: Diagnosis not present

## 2023-07-12 DIAGNOSIS — R6889 Other general symptoms and signs: Secondary | ICD-10-CM | POA: Diagnosis not present

## 2023-07-12 DIAGNOSIS — J019 Acute sinusitis, unspecified: Secondary | ICD-10-CM | POA: Diagnosis not present

## 2023-07-12 DIAGNOSIS — Z886 Allergy status to analgesic agent status: Secondary | ICD-10-CM | POA: Diagnosis not present

## 2023-07-12 DIAGNOSIS — Z79899 Other long term (current) drug therapy: Secondary | ICD-10-CM | POA: Diagnosis not present

## 2023-07-12 DIAGNOSIS — B9689 Other specified bacterial agents as the cause of diseases classified elsewhere: Secondary | ICD-10-CM | POA: Diagnosis not present

## 2023-07-12 DIAGNOSIS — R519 Headache, unspecified: Secondary | ICD-10-CM | POA: Diagnosis not present

## 2023-07-12 DIAGNOSIS — I10 Essential (primary) hypertension: Secondary | ICD-10-CM | POA: Diagnosis not present

## 2023-07-12 DIAGNOSIS — Z743 Need for continuous supervision: Secondary | ICD-10-CM | POA: Diagnosis not present

## 2023-07-12 DIAGNOSIS — Z7982 Long term (current) use of aspirin: Secondary | ICD-10-CM | POA: Diagnosis not present

## 2023-07-12 DIAGNOSIS — J018 Other acute sinusitis: Secondary | ICD-10-CM | POA: Diagnosis not present

## 2023-07-13 ENCOUNTER — Telehealth: Payer: Self-pay

## 2023-07-13 DIAGNOSIS — M1711 Unilateral primary osteoarthritis, right knee: Secondary | ICD-10-CM | POA: Diagnosis not present

## 2023-07-13 DIAGNOSIS — I6523 Occlusion and stenosis of bilateral carotid arteries: Secondary | ICD-10-CM | POA: Diagnosis not present

## 2023-07-13 DIAGNOSIS — I6782 Cerebral ischemia: Secondary | ICD-10-CM | POA: Diagnosis not present

## 2023-07-13 DIAGNOSIS — R519 Headache, unspecified: Secondary | ICD-10-CM | POA: Diagnosis not present

## 2023-07-13 DIAGNOSIS — G9389 Other specified disorders of brain: Secondary | ICD-10-CM | POA: Diagnosis not present

## 2023-07-13 NOTE — Transitions of Care (Post Inpatient/ED Visit) (Signed)
   07/13/2023  Name: Beth [name] MRN: [id number] DOB: 1946-02-16  Today's TOC FU Call Status: Today's TOC FU Call Status:: Unsuccessful Call (1st Attempt) Unsuccessful Call (1st Attempt) Date: 07/13/23  Attempted to reach the patient regarding the most recent Inpatient/ED visit.  Follow Up Plan: Additional outreach attempts will be made to reach the patient to complete the Transitions of Care (Post Inpatient/ED visit) call.   Jodelle Gross, RN, BSN, CCM Care Management Coordinator [place] Phone: [phone]/Fax: [phone]

## 2023-07-14 ENCOUNTER — Telehealth: Payer: Self-pay

## 2023-07-14 DIAGNOSIS — R32 Unspecified urinary incontinence: Secondary | ICD-10-CM | POA: Diagnosis not present

## 2023-07-14 DIAGNOSIS — M47816 Spondylosis without myelopathy or radiculopathy, lumbar region: Secondary | ICD-10-CM | POA: Diagnosis not present

## 2023-07-14 DIAGNOSIS — N179 Acute kidney failure, unspecified: Secondary | ICD-10-CM | POA: Diagnosis not present

## 2023-07-14 DIAGNOSIS — I110 Hypertensive heart disease with heart failure: Secondary | ICD-10-CM | POA: Diagnosis not present

## 2023-07-14 DIAGNOSIS — E119 Type 2 diabetes mellitus without complications: Secondary | ICD-10-CM | POA: Diagnosis not present

## 2023-07-14 DIAGNOSIS — M48061 Spinal stenosis, lumbar region without neurogenic claudication: Secondary | ICD-10-CM | POA: Diagnosis not present

## 2023-07-14 DIAGNOSIS — I5033 Acute on chronic diastolic (congestive) heart failure: Secondary | ICD-10-CM | POA: Diagnosis not present

## 2023-07-14 DIAGNOSIS — J449 Chronic obstructive pulmonary disease, unspecified: Secondary | ICD-10-CM | POA: Diagnosis not present

## 2023-07-14 NOTE — Transitions of Care (Post Inpatient/ED Visit) (Signed)
07/14/2023  Name: Beth [name] MRN: [id number] DOB: May 22, 1946  Today's TOC FU Call Status: Today's TOC FU Call Status:: Successful TOC FU Call Completed TOC FU Call Complete Date: 07/14/23  Transition Care Management Follow-up Telephone Call Date of Discharge: 07/10/23 Discharge Facility: Other ([facility]) Name of Other ([facility]) Discharge Facility: [facility] Type of Discharge: Inpatient Admission Primary Inpatient Discharge Diagnosis:: Acute on Chronic Diastolic Heart Failure How have you been since you were released from the hospital?: Better Any questions or concerns?: No  Items Reviewed: Did you receive and understand the discharge instructions provided?: Yes Medications obtained,verified, and reconciled?: Yes (Medications Reviewed) Any new allergies since your discharge?: No Dietary orders reviewed?: Yes Type of Diet Ordered:: Low sodium, heart healthy Do you have support at home?: Yes People in Home: child(ren), adult Name of Support/Comfort Primary Source: Mardene Celeste  Medications Reviewed Today: Medications Reviewed Today     Reviewed by Jodelle Gross, RN (Case Manager) on 07/14/23 at 1136  Med List Status: <None>   Medication Order Taking? Sig Documenting Provider Last Dose Status Informant  acetaminophen (TYLENOL) 500 MG tablet [id number] Yes Take 2 tablets (1,000 mg total) by mouth every 6 (six) hours as needed for mild pain. [name], MD Taking Active Self  amLODipine (NORVASC) 10 MG tablet [id number] Yes Take 1 tablet (10 mg total) by mouth daily. [name], MD Taking Active   atorvastatin (LIPITOR) 20 MG tablet [id number] Yes Take 1 tablet (20 mg total) by mouth every evening. [name], MD Taking Active   Blood Glucose Calibration (TRUE METRIX LEVEL 1) Low SOLN [id number] Yes Use with glucose monitor Dx E11.9 [name], MD Taking Active Self  Blood Glucose Monitoring Suppl (TRUE METRIX AIR GLUCOSE METER) w/Device KIT [id number] Yes 1 Device by  Does not apply route 4 (four) times daily. Dx E11.9 [name], MD Taking Active Self  cephALEXin (KEFLEX) 500 MG capsule [id number] Yes Take 500 mg by mouth 4 (four) times daily. [provider] Taking Active   colchicine 0.6 MG tablet [id number] Yes Take twice daily for gout attack. (may take every two hours up to 6 doses at acute onset) [name], MD Taking Active   diltiazem (CARDIZEM CD) 240 MG 24 hr capsule [id number] Yes Take 1 capsule (240 mg total) by mouth daily. [name], MD Taking Active   empagliflozin (JARDIANCE) 10 MG TABS tablet [id number] Yes Take 10 mg by mouth daily. [provider] Taking Active   ferrous sulfate 325 (65 FE) MG tablet [id number] Yes Take 325 mg by mouth daily. [provider] Taking Active   furosemide (LASIX) 20 MG tablet [id number] Yes Take 20 mg by mouth daily. [provider] Taking Active   glucose blood (TRUE METRIX BLOOD GLUCOSE TEST) test strip [id number] Yes check glucose up to four times daily Dx E11.9 [name], MD Taking Active   hydrALAZINE (APRESOLINE) 100 MG tablet [id number] Yes Take 1 tablet (100 mg total) by mouth 3 (three) times daily. (NEEDS TO BE SEEN BEFORE NEXT REFILL) [name], MD Taking Active   isosorbide dinitrate (ISORDIL) 10 MG tablet [id number] Yes Take 1 tablet (10 mg total) by mouth 3 (three) times daily. [name], MD Taking Active   metFORMIN (GLUCOPHAGE-XR) 500 MG 24 hr tablet [id number] Yes Take 1 tablet (500 mg total) by mouth daily. [name], MD Taking Active   methylPREDNISolone (MEDROL DOSEPAK) 4 MG TBPK tablet [id number] Yes Take 4 mg by mouth See admin instructions. [provider] Taking Active  Nebivolol HCl 20 MG TABS [id number] Yes TAKE ONE (1) TABLET EACH Sena Hitch, Broadus John, MD Taking Active   olmesartan (BENICAR) 40 MG tablet [id number] Yes TAKE ONE (1) TABLET EACH Lowella Fairy, MD Taking Active   Omega-3 Fatty Acids (FISH OIL) 1000 MG CAPS  [id number] Yes Take 1,000 mg by mouth. Take one tablet by mouth 2 to 3 times weekly [provider] Taking Active   TRUEplus Lancets 33G MISC [id number] Yes Test QID Dx E11.9 [name], MD Taking Active Self            Home Care and Equipment/Supplies: Were Home Health Services Ordered?: No Any new equipment or medical supplies ordered?: No  Functional Questionnaire: Do you need assistance with bathing/showering or dressing?: No Do you need assistance with meal preparation?: No Do you need assistance with eating?: No Do you have difficulty maintaining continence: No Do you need assistance with getting out of bed/getting out of a chair/moving?: No Do you have difficulty managing or taking your medications?: No  Follow up appointments reviewed: PCP Follow-up appointment confirmed?: Yes Date of PCP follow-up appointment?: 07/23/23 Follow-up Provider: Dr. Darlyn Read Specialist [facility] Follow-up appointment confirmed?: NA Do you need transportation to your follow-up appointment?: No Do you understand care options if your condition(s) worsen?: Yes-patient verbalized understanding  SDOH Interventions Today    Flowsheet Row Most Recent Value  SDOH Interventions   Food Insecurity Interventions Intervention Not Indicated  Housing Interventions Intervention Not Indicated  Transportation Interventions Intervention Not Indicated      TOC Interventions Today    Flowsheet Row Most Recent Value  TOC Interventions   TOC Interventions Discussed/Reviewed TOC Interventions Discussed, TOC Interventions Reviewed, Arranged PCP follow up less than 12 days/Care Guide scheduled       Jodelle Gross, RN, BSN, CCM Care Management Coordinator [place] Phone: [phone]/Fax: [phone]

## 2023-07-15 DIAGNOSIS — N179 Acute kidney failure, unspecified: Secondary | ICD-10-CM | POA: Diagnosis not present

## 2023-07-15 DIAGNOSIS — I110 Hypertensive heart disease with heart failure: Secondary | ICD-10-CM | POA: Diagnosis not present

## 2023-07-15 DIAGNOSIS — M48061 Spinal stenosis, lumbar region without neurogenic claudication: Secondary | ICD-10-CM | POA: Diagnosis not present

## 2023-07-15 DIAGNOSIS — J449 Chronic obstructive pulmonary disease, unspecified: Secondary | ICD-10-CM | POA: Diagnosis not present

## 2023-07-15 DIAGNOSIS — R32 Unspecified urinary incontinence: Secondary | ICD-10-CM | POA: Diagnosis not present

## 2023-07-15 DIAGNOSIS — M47816 Spondylosis without myelopathy or radiculopathy, lumbar region: Secondary | ICD-10-CM | POA: Diagnosis not present

## 2023-07-15 DIAGNOSIS — E119 Type 2 diabetes mellitus without complications: Secondary | ICD-10-CM | POA: Diagnosis not present

## 2023-07-15 DIAGNOSIS — I5033 Acute on chronic diastolic (congestive) heart failure: Secondary | ICD-10-CM | POA: Diagnosis not present

## 2023-07-17 ENCOUNTER — Telehealth: Payer: Self-pay | Admitting: Family Medicine

## 2023-07-17 NOTE — Telephone Encounter (Signed)
Patient would benefit from having a hospital bed at home because she is having a hard time getting in and out of the one she currently has because it is an older one.

## 2023-07-20 ENCOUNTER — Other Ambulatory Visit: Payer: Self-pay | Admitting: Family Medicine

## 2023-07-20 NOTE — Telephone Encounter (Signed)
Patient has appointment this week, can discuss at that time

## 2023-07-20 NOTE — Telephone Encounter (Signed)
Requires office visit

## 2023-07-21 DIAGNOSIS — E119 Type 2 diabetes mellitus without complications: Secondary | ICD-10-CM | POA: Diagnosis not present

## 2023-07-21 DIAGNOSIS — I110 Hypertensive heart disease with heart failure: Secondary | ICD-10-CM | POA: Diagnosis not present

## 2023-07-21 DIAGNOSIS — N179 Acute kidney failure, unspecified: Secondary | ICD-10-CM | POA: Diagnosis not present

## 2023-07-21 DIAGNOSIS — J449 Chronic obstructive pulmonary disease, unspecified: Secondary | ICD-10-CM | POA: Diagnosis not present

## 2023-07-21 DIAGNOSIS — M48061 Spinal stenosis, lumbar region without neurogenic claudication: Secondary | ICD-10-CM | POA: Diagnosis not present

## 2023-07-21 DIAGNOSIS — M47816 Spondylosis without myelopathy or radiculopathy, lumbar region: Secondary | ICD-10-CM | POA: Diagnosis not present

## 2023-07-21 DIAGNOSIS — I5033 Acute on chronic diastolic (congestive) heart failure: Secondary | ICD-10-CM | POA: Diagnosis not present

## 2023-07-21 DIAGNOSIS — R32 Unspecified urinary incontinence: Secondary | ICD-10-CM | POA: Diagnosis not present

## 2023-07-23 ENCOUNTER — Encounter: Payer: Self-pay | Admitting: Family Medicine

## 2023-07-23 ENCOUNTER — Ambulatory Visit (INDEPENDENT_AMBULATORY_CARE_PROVIDER_SITE_OTHER): Payer: Medicare HMO | Admitting: Family Medicine

## 2023-07-23 VITALS — BP 101/53 | HR 47 | Temp 97.7°F | Ht 66.0 in

## 2023-07-23 DIAGNOSIS — I5032 Chronic diastolic (congestive) heart failure: Secondary | ICD-10-CM

## 2023-07-23 DIAGNOSIS — N184 Chronic kidney disease, stage 4 (severe): Secondary | ICD-10-CM

## 2023-07-23 DIAGNOSIS — E1122 Type 2 diabetes mellitus with diabetic chronic kidney disease: Secondary | ICD-10-CM

## 2023-07-23 LAB — BAYER DCA HB A1C WAIVED: HB A1C (BAYER DCA - WAIVED): 5.9 % — ABNORMAL HIGH (ref 4.8–5.6)

## 2023-07-23 NOTE — Progress Notes (Signed)
Subjective:  Patient ID: Beth [name],  female    DOB: 13-Sep-1946  Age: 77 y.o.    CC: Hospitalization Follow-up   HPI Beth [name] presents for  follow-up of hypertension. Patient has no history of headache chest pain or shortness of breath or recent cough. Patient also denies symptoms of TIA such as numbness weakness lateralizing. Patient denies side effects from medication. States taking it regularly.  Patient also  in for follow-up of elevated cholesterol. Doing well without complaints on current medication. Denies side effects  including myalgia and arthralgia and nausea. Also in today for liver function testing. Currently no chest pain, shortness of breath or other cardiovascular related symptoms noted.  Follow-up of diabetes. Patient does check blood sugar at home. Patient denies symptoms such as excessive hunger or urinary frequency, excessive hunger, nausea No significant hypoglycemic spells noted. Medications reviewed. Pt reports taking them regularly. Pt. denies complication/adverse reaction today.    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   She has a past surgical history that includes Carpal tunnel release (Right, 1983); colonscopy (June 21, 2014); nasal cauterization (2012); Total knee arthroplasty (Right, 07/03/2014); Patellar tendon repair (Right, 08/11/2014); Joint replacement (06/2014); I & D knee with poly exchange (Right, 10/02/2014); Excisional  total knee arthroplasty with antibiotic spacers (Right, 02/16/2015); Reimplantation of total knee (Right, 05/23/2015); Abdominal hysterectomy (1983); Tubal ligation; Incision and drainage of wound (Right, 01/14/2017); Excisional total knee arthroplasty with antibiotic spacers (Right, 11/03/2018); and I & D knee with poly exchange (Right, 08/27/2020).   Her family history includes Cancer in her mother; Diabetes in her son and son; Heart disease (age of onset: 37) in her daughter; Heart disease (age of onset: 67) in her brother; Hypertension in her father; Kidney disease in her daughter; Ovarian cancer in her mother; Peripheral vascular disease in her father.She reports that she has never smoked. She has never used smokeless tobacco. She reports that she does not drink alcohol and does not use drugs.  Current Outpatient Medications on File Prior to Visit  Medication Sig Dispense Refill   acetaminophen (TYLENOL) 500 MG tablet Take 2 tablets (1,000 mg total) by mouth every 6 (six) hours as needed for mild pain. 90 tablet 1   atorvastatin (LIPITOR) 20 MG tablet Take 1 tablet (20 mg total) by mouth every evening. 90 tablet 3   colchicine 0.6 MG tablet Take twice daily for gout attack. (may take every two hours up to 6 doses at acute onset) 60 tablet 2   diltiazem (CARDIZEM CD) 240 MG 24 hr capsule Take 1 capsule (240 mg total) by mouth daily. 90 capsule 3   empagliflozin (JARDIANCE) 10 MG TABS tablet Take 10 mg by mouth daily.     ferrous sulfate 325 (65 FE) MG tablet Take 325 mg by mouth daily.     furosemide (LASIX) 20 MG tablet Take 20 mg by mouth daily.     hydrALAZINE (APRESOLINE) 25 MG tablet Take 25 mg by mouth 3 (three) times daily.     isosorbide dinitrate (ISORDIL) 10 MG tablet Take 1 tablet (10 mg total) by mouth 3 (three) times daily. 270  tablet 3   Nebivolol HCl 20 MG TABS TAKE ONE (1) TABLET EACH DAY 90 tablet 3   olmesartan (BENICAR) 40 MG tablet TAKE ONE (1) TABLET EACH DAY 90 tablet 3   No  current facility-administered medications on file prior to visit.    ROS Review of Systems  Constitutional: Negative.   HENT: Negative.    Eyes:  Negative for visual disturbance.  Respiratory:  Negative for shortness of breath.   Cardiovascular:  Negative for chest pain.  Gastrointestinal:  Negative for abdominal pain.  Musculoskeletal:  Negative for arthralgias.    Objective:  BP (!) 101/53   Pulse (!) 47   Temp 97.7 F (36.5 C)   Ht 5\' 6"  (1.676 m)   SpO2 96%   BMI 35.51 kg/m   BP Readings from Last 3 Encounters:  07/23/23 (!) 101/53  05/11/23 137/63  12/18/22 122/70    Wt Readings from Last 3 Encounters:  12/18/22 220 lb (99.8 kg)  12/15/22 220 lb (99.8 kg)  12/11/21 230 lb (104.3 kg)     Physical Exam Constitutional:      General: She is not in acute distress.    Appearance: She is well-developed.  Cardiovascular:     Rate and Rhythm: Normal rate and regular rhythm.  Pulmonary:     Breath sounds: Normal breath sounds.  Musculoskeletal:        General: Normal range of motion.  Skin:    General: Skin is warm and dry.  Neurological:     Mental Status: She is alert and oriented to person, place, and time.     Diabetic Foot Exam - Simple   No data filed     Lab Results  Component Value Date   HGBA1C 5.9 (H) 07/23/2023   HGBA1C 5.7 (H) 12/18/2022   HGBA1C 5.5 06/17/2022    Assessment & Plan:   Beth [name] was seen today for hospitalization follow-up.  Diagnoses and all orders for this visit:  Chronic diastolic congestive heart failure (HCC) -     Ambulatory referral to Cardiology -     Brain natriuretic peptide -     CBC with Differential/Platelet -     CMP14+EGFR -     Bayer DCA Hb A1c Waived  Controlled type 2 diabetes mellitus with stage 4 chronic kidney disease, without long-term current use of insulin (HCC) -     Ambulatory referral to Cardiology -     Brain natriuretic peptide -     CBC with Differential/Platelet -     CMP14+EGFR -      Bayer DCA Hb A1c Waived   I have discontinued Lewis Moccasin. Sapien's True Metrix Air Glucose Meter, True Metrix Level 1, TRUEplus Lancets 33G, True Metrix Blood Glucose Test, Fish Oil, amLODipine, metFORMIN, cephALEXin, and methylPREDNISolone. I am also having her maintain her acetaminophen, ferrous sulfate, atorvastatin, Nebivolol HCl, isosorbide dinitrate, diltiazem, olmesartan, colchicine, empagliflozin, furosemide, and hydrALAZINE. We will stop administering (tranexamic acid (CYKLOKAPRON) 2,000 mg in sodium chloride 0.9 % 50 mL Topical Application).  No orders of the defined types were placed in this encounter.    Follow-up: Return in about 2 weeks (around 08/06/2023) for CHF.  [name], M.D.

## 2023-07-24 ENCOUNTER — Telehealth: Payer: Self-pay | Admitting: Family Medicine

## 2023-07-24 DIAGNOSIS — M47816 Spondylosis without myelopathy or radiculopathy, lumbar region: Secondary | ICD-10-CM | POA: Diagnosis not present

## 2023-07-24 DIAGNOSIS — I5033 Acute on chronic diastolic (congestive) heart failure: Secondary | ICD-10-CM | POA: Diagnosis not present

## 2023-07-24 DIAGNOSIS — I110 Hypertensive heart disease with heart failure: Secondary | ICD-10-CM | POA: Diagnosis not present

## 2023-07-24 DIAGNOSIS — E119 Type 2 diabetes mellitus without complications: Secondary | ICD-10-CM | POA: Diagnosis not present

## 2023-07-24 DIAGNOSIS — M48061 Spinal stenosis, lumbar region without neurogenic claudication: Secondary | ICD-10-CM | POA: Diagnosis not present

## 2023-07-24 DIAGNOSIS — N179 Acute kidney failure, unspecified: Secondary | ICD-10-CM | POA: Diagnosis not present

## 2023-07-24 DIAGNOSIS — R32 Unspecified urinary incontinence: Secondary | ICD-10-CM | POA: Diagnosis not present

## 2023-07-24 DIAGNOSIS — J449 Chronic obstructive pulmonary disease, unspecified: Secondary | ICD-10-CM | POA: Diagnosis not present

## 2023-07-24 LAB — CBC WITH DIFFERENTIAL/PLATELET
Basophils Absolute: 0 10*3/uL (ref 0.0–0.2)
Basos: 1 %
EOS (ABSOLUTE): 0.2 10*3/uL (ref 0.0–0.4)
Eos: 2 %
Hematocrit: 33.8 % — ABNORMAL LOW (ref 34.0–46.6)
Hemoglobin: 10.9 g/dL — ABNORMAL LOW (ref 11.1–15.9)
Immature Grans (Abs): 0.1 10*3/uL (ref 0.0–0.1)
Immature Granulocytes: 1 %
Lymphocytes Absolute: 1.6 10*3/uL (ref 0.7–3.1)
Lymphs: 24 %
MCH: 26 pg — ABNORMAL LOW (ref 26.6–33.0)
MCHC: 32.2 g/dL (ref 31.5–35.7)
MCV: 81 fL (ref 79–97)
Monocytes Absolute: 0.7 10*3/uL (ref 0.1–0.9)
Monocytes: 11 %
Neutrophils Absolute: 4.1 10*3/uL (ref 1.4–7.0)
Neutrophils: 61 %
Platelets: 395 10*3/uL (ref 150–450)
RBC: 4.19 x10E6/uL (ref 3.77–5.28)
RDW: 14.1 % (ref 11.7–15.4)
WBC: 6.6 10*3/uL (ref 3.4–10.8)

## 2023-07-24 LAB — CMP14+EGFR
ALT: 10 IU/L (ref 0–32)
AST: 13 IU/L (ref 0–40)
Albumin: 3.9 g/dL (ref 3.8–4.8)
Alkaline Phosphatase: 135 IU/L — ABNORMAL HIGH (ref 44–121)
BUN/Creatinine Ratio: 21 (ref 12–28)
BUN: 38 mg/dL — ABNORMAL HIGH (ref 8–27)
Bilirubin Total: 0.5 mg/dL (ref 0.0–1.2)
CO2: 20 mmol/L (ref 20–29)
Calcium: 8.9 mg/dL (ref 8.7–10.3)
Chloride: 100 mmol/L (ref 96–106)
Creatinine, Ser: 1.81 mg/dL — ABNORMAL HIGH (ref 0.57–1.00)
Globulin, Total: 2.8 g/dL (ref 1.5–4.5)
Glucose: 99 mg/dL (ref 70–99)
Potassium: 4.9 mmol/L (ref 3.5–5.2)
Sodium: 136 mmol/L (ref 134–144)
Total Protein: 6.7 g/dL (ref 6.0–8.5)
eGFR: 28 mL/min/{1.73_m2} — ABNORMAL LOW (ref >59)

## 2023-07-24 LAB — BRAIN NATRIURETIC PEPTIDE: BNP: 154.4 pg/mL — ABNORMAL HIGH (ref 0.0–100.0)

## 2023-07-24 NOTE — Telephone Encounter (Signed)
Physical therapy calling to let us know they will be starting once a week for 6 weeks

## 2023-07-26 ENCOUNTER — Encounter: Payer: Self-pay | Admitting: Family Medicine

## 2023-07-27 ENCOUNTER — Other Ambulatory Visit: Payer: Self-pay | Admitting: Family Medicine

## 2023-07-27 ENCOUNTER — Telehealth: Payer: Self-pay | Admitting: Family Medicine

## 2023-07-27 ENCOUNTER — Other Ambulatory Visit: Payer: Self-pay | Admitting: *Deleted

## 2023-07-27 MED ORDER — HYDRALAZINE HCL 25 MG PO TABS: 25.0000 mg | ORAL_TABLET | Freq: Three times a day (TID) | ORAL | 2 refills | Status: DC

## 2023-07-27 MED ORDER — FUROSEMIDE 20 MG PO TABS: 20.0000 mg | ORAL_TABLET | Freq: Every day | ORAL | 5 refills | Status: DC

## 2023-07-27 NOTE — Telephone Encounter (Signed)
  Prescription Request  07/27/2023  What is the name of the medication or equipment? FUROSEMIDE and HYDRALAZINE  Have you contacted your pharmacy to request a refill? YES  Which pharmacy would you like this sent [facility] [place]  Pt called requesting that Dr Darlyn Read go ahead and send in her furosemide and hydralazine Rx's. Explained to pt that it looks like Dr Darlyn Read discontinued the Hydralazine Rx. Pt says no he didn't and that Dr Darlyn Read had advised her to take a different dose of it but can't remember what dose he wants her to take. Please advise.

## 2023-07-27 NOTE — Telephone Encounter (Signed)
Pt aware and verbalized understanding.  

## 2023-07-27 NOTE — Telephone Encounter (Signed)
Please let the patient know that I sent their prescription to their pharmacy. Thanks, WS 

## 2023-07-28 DIAGNOSIS — N179 Acute kidney failure, unspecified: Secondary | ICD-10-CM | POA: Diagnosis not present

## 2023-07-28 DIAGNOSIS — J449 Chronic obstructive pulmonary disease, unspecified: Secondary | ICD-10-CM | POA: Diagnosis not present

## 2023-07-28 DIAGNOSIS — M48061 Spinal stenosis, lumbar region without neurogenic claudication: Secondary | ICD-10-CM | POA: Diagnosis not present

## 2023-07-28 DIAGNOSIS — I5033 Acute on chronic diastolic (congestive) heart failure: Secondary | ICD-10-CM | POA: Diagnosis not present

## 2023-07-28 DIAGNOSIS — M47816 Spondylosis without myelopathy or radiculopathy, lumbar region: Secondary | ICD-10-CM | POA: Diagnosis not present

## 2023-07-28 DIAGNOSIS — R32 Unspecified urinary incontinence: Secondary | ICD-10-CM | POA: Diagnosis not present

## 2023-07-28 DIAGNOSIS — I110 Hypertensive heart disease with heart failure: Secondary | ICD-10-CM | POA: Diagnosis not present

## 2023-07-28 DIAGNOSIS — E119 Type 2 diabetes mellitus without complications: Secondary | ICD-10-CM | POA: Diagnosis not present

## 2023-07-29 DIAGNOSIS — M48061 Spinal stenosis, lumbar region without neurogenic claudication: Secondary | ICD-10-CM | POA: Diagnosis not present

## 2023-07-29 DIAGNOSIS — R32 Unspecified urinary incontinence: Secondary | ICD-10-CM | POA: Diagnosis not present

## 2023-07-29 DIAGNOSIS — J449 Chronic obstructive pulmonary disease, unspecified: Secondary | ICD-10-CM | POA: Diagnosis not present

## 2023-07-29 DIAGNOSIS — N179 Acute kidney failure, unspecified: Secondary | ICD-10-CM | POA: Diagnosis not present

## 2023-07-29 DIAGNOSIS — E119 Type 2 diabetes mellitus without complications: Secondary | ICD-10-CM | POA: Diagnosis not present

## 2023-07-29 DIAGNOSIS — M47816 Spondylosis without myelopathy or radiculopathy, lumbar region: Secondary | ICD-10-CM | POA: Diagnosis not present

## 2023-07-29 DIAGNOSIS — I5033 Acute on chronic diastolic (congestive) heart failure: Secondary | ICD-10-CM | POA: Diagnosis not present

## 2023-07-29 DIAGNOSIS — I110 Hypertensive heart disease with heart failure: Secondary | ICD-10-CM | POA: Diagnosis not present

## 2023-08-04 DIAGNOSIS — M47816 Spondylosis without myelopathy or radiculopathy, lumbar region: Secondary | ICD-10-CM | POA: Diagnosis not present

## 2023-08-04 DIAGNOSIS — R32 Unspecified urinary incontinence: Secondary | ICD-10-CM | POA: Diagnosis not present

## 2023-08-04 DIAGNOSIS — N179 Acute kidney failure, unspecified: Secondary | ICD-10-CM | POA: Diagnosis not present

## 2023-08-04 DIAGNOSIS — I5033 Acute on chronic diastolic (congestive) heart failure: Secondary | ICD-10-CM | POA: Diagnosis not present

## 2023-08-04 DIAGNOSIS — J449 Chronic obstructive pulmonary disease, unspecified: Secondary | ICD-10-CM | POA: Diagnosis not present

## 2023-08-04 DIAGNOSIS — E119 Type 2 diabetes mellitus without complications: Secondary | ICD-10-CM | POA: Diagnosis not present

## 2023-08-04 DIAGNOSIS — I110 Hypertensive heart disease with heart failure: Secondary | ICD-10-CM | POA: Diagnosis not present

## 2023-08-04 DIAGNOSIS — M48061 Spinal stenosis, lumbar region without neurogenic claudication: Secondary | ICD-10-CM | POA: Diagnosis not present

## 2023-08-05 ENCOUNTER — Ambulatory Visit (INDEPENDENT_AMBULATORY_CARE_PROVIDER_SITE_OTHER): Payer: Medicare HMO

## 2023-08-05 DIAGNOSIS — R52 Pain, unspecified: Secondary | ICD-10-CM | POA: Diagnosis not present

## 2023-08-05 DIAGNOSIS — E785 Hyperlipidemia, unspecified: Secondary | ICD-10-CM

## 2023-08-05 DIAGNOSIS — I110 Hypertensive heart disease with heart failure: Secondary | ICD-10-CM | POA: Diagnosis not present

## 2023-08-05 DIAGNOSIS — E119 Type 2 diabetes mellitus without complications: Secondary | ICD-10-CM

## 2023-08-05 DIAGNOSIS — I5033 Acute on chronic diastolic (congestive) heart failure: Secondary | ICD-10-CM

## 2023-08-05 DIAGNOSIS — M47816 Spondylosis without myelopathy or radiculopathy, lumbar region: Secondary | ICD-10-CM

## 2023-08-05 DIAGNOSIS — N179 Acute kidney failure, unspecified: Secondary | ICD-10-CM

## 2023-08-05 DIAGNOSIS — J449 Chronic obstructive pulmonary disease, unspecified: Secondary | ICD-10-CM

## 2023-08-05 DIAGNOSIS — R32 Unspecified urinary incontinence: Secondary | ICD-10-CM | POA: Diagnosis not present

## 2023-08-05 DIAGNOSIS — M48061 Spinal stenosis, lumbar region without neurogenic claudication: Secondary | ICD-10-CM

## 2023-08-06 ENCOUNTER — Telehealth: Payer: Self-pay | Admitting: Family Medicine

## 2023-08-06 DIAGNOSIS — I5032 Chronic diastolic (congestive) heart failure: Secondary | ICD-10-CM

## 2023-08-06 NOTE — Telephone Encounter (Signed)
Give her his number and she will ned to call him

## 2023-08-06 NOTE — Telephone Encounter (Signed)
Pt called stating that she needs to see the heart specialist. Explained to pt that Dr Darlyn Read has already referred her to see Dr Antoine Poche and his office has tried contacting patient numerous times with no response so her referral was closed.  Pt says she has been having issues with her home phone so she is only using her cell phone. Explained to pt that we only have her cell phone # listed so they wouldn't have called her house #. Pt voiced understanding and said she didn't know the reason of why she hasn't heard from anyone then. Confirmed that her cell phone # is [phone].  Pt needs new referral for Dr Antoine Poche to be placed.

## 2023-08-07 NOTE — Telephone Encounter (Signed)
New referral has been placed because old referrals have been closed

## 2023-08-11 DIAGNOSIS — I5033 Acute on chronic diastolic (congestive) heart failure: Secondary | ICD-10-CM | POA: Diagnosis not present

## 2023-08-11 DIAGNOSIS — M48061 Spinal stenosis, lumbar region without neurogenic claudication: Secondary | ICD-10-CM | POA: Diagnosis not present

## 2023-08-11 DIAGNOSIS — I110 Hypertensive heart disease with heart failure: Secondary | ICD-10-CM | POA: Diagnosis not present

## 2023-08-11 DIAGNOSIS — N179 Acute kidney failure, unspecified: Secondary | ICD-10-CM | POA: Diagnosis not present

## 2023-08-11 DIAGNOSIS — E119 Type 2 diabetes mellitus without complications: Secondary | ICD-10-CM | POA: Diagnosis not present

## 2023-08-11 DIAGNOSIS — R32 Unspecified urinary incontinence: Secondary | ICD-10-CM | POA: Diagnosis not present

## 2023-08-11 DIAGNOSIS — M47816 Spondylosis without myelopathy or radiculopathy, lumbar region: Secondary | ICD-10-CM | POA: Diagnosis not present

## 2023-08-11 DIAGNOSIS — J449 Chronic obstructive pulmonary disease, unspecified: Secondary | ICD-10-CM | POA: Diagnosis not present

## 2023-08-12 DIAGNOSIS — I110 Hypertensive heart disease with heart failure: Secondary | ICD-10-CM | POA: Diagnosis not present

## 2023-08-12 DIAGNOSIS — R32 Unspecified urinary incontinence: Secondary | ICD-10-CM | POA: Diagnosis not present

## 2023-08-12 DIAGNOSIS — N179 Acute kidney failure, unspecified: Secondary | ICD-10-CM | POA: Diagnosis not present

## 2023-08-12 DIAGNOSIS — E119 Type 2 diabetes mellitus without complications: Secondary | ICD-10-CM | POA: Diagnosis not present

## 2023-08-12 DIAGNOSIS — J449 Chronic obstructive pulmonary disease, unspecified: Secondary | ICD-10-CM | POA: Diagnosis not present

## 2023-08-12 DIAGNOSIS — I5033 Acute on chronic diastolic (congestive) heart failure: Secondary | ICD-10-CM | POA: Diagnosis not present

## 2023-08-12 DIAGNOSIS — M48061 Spinal stenosis, lumbar region without neurogenic claudication: Secondary | ICD-10-CM | POA: Diagnosis not present

## 2023-08-12 DIAGNOSIS — M47816 Spondylosis without myelopathy or radiculopathy, lumbar region: Secondary | ICD-10-CM | POA: Diagnosis not present

## 2023-08-13 DIAGNOSIS — R32 Unspecified urinary incontinence: Secondary | ICD-10-CM | POA: Diagnosis not present

## 2023-08-13 DIAGNOSIS — I110 Hypertensive heart disease with heart failure: Secondary | ICD-10-CM | POA: Diagnosis not present

## 2023-08-13 DIAGNOSIS — J449 Chronic obstructive pulmonary disease, unspecified: Secondary | ICD-10-CM | POA: Diagnosis not present

## 2023-08-13 DIAGNOSIS — M47816 Spondylosis without myelopathy or radiculopathy, lumbar region: Secondary | ICD-10-CM | POA: Diagnosis not present

## 2023-08-13 DIAGNOSIS — E119 Type 2 diabetes mellitus without complications: Secondary | ICD-10-CM | POA: Diagnosis not present

## 2023-08-13 DIAGNOSIS — M48061 Spinal stenosis, lumbar region without neurogenic claudication: Secondary | ICD-10-CM | POA: Diagnosis not present

## 2023-08-13 DIAGNOSIS — I5033 Acute on chronic diastolic (congestive) heart failure: Secondary | ICD-10-CM | POA: Diagnosis not present

## 2023-08-13 DIAGNOSIS — N179 Acute kidney failure, unspecified: Secondary | ICD-10-CM | POA: Diagnosis not present

## 2023-08-18 ENCOUNTER — Ambulatory Visit: Payer: Medicare HMO | Admitting: Family Medicine

## 2023-08-18 DIAGNOSIS — E1142 Type 2 diabetes mellitus with diabetic polyneuropathy: Secondary | ICD-10-CM | POA: Diagnosis not present

## 2023-08-18 DIAGNOSIS — B351 Tinea unguium: Secondary | ICD-10-CM | POA: Diagnosis not present

## 2023-08-18 DIAGNOSIS — M79676 Pain in unspecified toe(s): Secondary | ICD-10-CM | POA: Diagnosis not present

## 2023-08-18 DIAGNOSIS — L84 Corns and callosities: Secondary | ICD-10-CM | POA: Diagnosis not present

## 2023-08-19 DIAGNOSIS — M47816 Spondylosis without myelopathy or radiculopathy, lumbar region: Secondary | ICD-10-CM | POA: Diagnosis not present

## 2023-08-19 DIAGNOSIS — J449 Chronic obstructive pulmonary disease, unspecified: Secondary | ICD-10-CM | POA: Diagnosis not present

## 2023-08-19 DIAGNOSIS — I5033 Acute on chronic diastolic (congestive) heart failure: Secondary | ICD-10-CM | POA: Diagnosis not present

## 2023-08-19 DIAGNOSIS — I110 Hypertensive heart disease with heart failure: Secondary | ICD-10-CM | POA: Diagnosis not present

## 2023-08-19 DIAGNOSIS — R32 Unspecified urinary incontinence: Secondary | ICD-10-CM | POA: Diagnosis not present

## 2023-08-19 DIAGNOSIS — M48061 Spinal stenosis, lumbar region without neurogenic claudication: Secondary | ICD-10-CM | POA: Diagnosis not present

## 2023-08-19 DIAGNOSIS — E119 Type 2 diabetes mellitus without complications: Secondary | ICD-10-CM | POA: Diagnosis not present

## 2023-08-19 DIAGNOSIS — N179 Acute kidney failure, unspecified: Secondary | ICD-10-CM | POA: Diagnosis not present

## 2023-08-20 ENCOUNTER — Ambulatory Visit (INDEPENDENT_AMBULATORY_CARE_PROVIDER_SITE_OTHER): Payer: Medicare HMO | Admitting: Family Medicine

## 2023-08-20 ENCOUNTER — Encounter: Payer: Self-pay | Admitting: Family Medicine

## 2023-08-20 ENCOUNTER — Ambulatory Visit: Payer: Medicare HMO

## 2023-08-20 VITALS — BP 130/79 | HR 79 | Temp 97.8°F | Ht 66.0 in

## 2023-08-20 DIAGNOSIS — D509 Iron deficiency anemia, unspecified: Secondary | ICD-10-CM | POA: Diagnosis not present

## 2023-08-20 DIAGNOSIS — Z7984 Long term (current) use of oral hypoglycemic drugs: Secondary | ICD-10-CM | POA: Diagnosis not present

## 2023-08-20 DIAGNOSIS — I5042 Chronic combined systolic (congestive) and diastolic (congestive) heart failure: Secondary | ICD-10-CM | POA: Diagnosis not present

## 2023-08-20 DIAGNOSIS — Z23 Encounter for immunization: Secondary | ICD-10-CM

## 2023-08-20 DIAGNOSIS — Z780 Asymptomatic menopausal state: Secondary | ICD-10-CM

## 2023-08-20 DIAGNOSIS — I10 Essential (primary) hypertension: Secondary | ICD-10-CM | POA: Diagnosis not present

## 2023-08-20 DIAGNOSIS — I509 Heart failure, unspecified: Secondary | ICD-10-CM | POA: Insufficient documentation

## 2023-08-20 DIAGNOSIS — E1122 Type 2 diabetes mellitus with diabetic chronic kidney disease: Secondary | ICD-10-CM | POA: Diagnosis not present

## 2023-08-20 NOTE — Progress Notes (Signed)
2`  Subjective:  Patient ID: Beth [name], female    DOB: 1946-08-10  Age: 77 y.o. MRN: [id number]  CC: Follow-up   HPI Beth [name] presents for  recheck of ChF, diabetes. Now breathing much better. Not swollen. No chest pain.      08/20/2023    3:09 PM 07/23/2023    3:16 PM 07/23/2023    3:05 PM  Depression screen PHQ 2/9  Decreased Interest 0 0 0  Down, Depressed, Hopeless 0 0 0  PHQ - 2 Score 0 0 0    History [name] has a past medical history of Anemia, Arthritis, Blood transfusion without reported diagnosis (2012), Cataract, CKD (chronic kidney disease), stage III (HCC), Diabetes mellitus without complication (HCC), Family history of anesthesia complication, Gout, Herpes infection (08/09/2014), Hyperlipidemia, Hypertension, Nocturia, Osteoarthritis of both sacroiliac joints (HCC) (08/22/2019), Osteomyelitis of right tibia (HCC) (07/23/2020), Other acute osteomyelitis, right femur (HCC) (07/23/2020), Pseudomonas aeruginosa infection (12/15/2018), and Stroke (HCC) (2006).   She has a past surgical history that includes Carpal tunnel release (Right, 1983); colonscopy (June 21, 2014); nasal cauterization (2012); Total knee arthroplasty (Right, 07/03/2014); Patellar tendon repair (Right, 08/11/2014); Joint replacement (06/2014); I & D knee with poly exchange (Right, 10/02/2014); Excisional total knee arthroplasty with antibiotic spacers (Right, 02/16/2015); Reimplantation of total knee (Right, 05/23/2015); Abdominal hysterectomy (1983); Tubal ligation; Incision and drainage of wound (Right, 01/14/2017); Excisional total knee arthroplasty with antibiotic spacers (Right, 11/03/2018); and I & D knee with poly exchange (Right, 08/27/2020).   Her family history includes Cancer in her mother; Diabetes in her son and son; Heart disease (age of onset: 86) in her daughter; Heart disease (age of onset: 57) in her brother; Hypertension in her father; Kidney disease in her daughter; Ovarian cancer in her mother;  Peripheral vascular disease in her father.She reports that she has never smoked. She has never used smokeless tobacco. She reports that she does not drink alcohol and does not use drugs.    ROS Review of Systems  Constitutional: Negative.   HENT: Negative.    Eyes:  Negative for visual disturbance.  Respiratory:  Negative for shortness of breath.   Cardiovascular:  Negative for chest pain.  Gastrointestinal:  Negative for abdominal pain.    Objective:  BP 130/79   Pulse 79   Temp 97.8 F (36.6 C)   Ht 5\' 6"  (1.676 m)   SpO2 93%   BMI 35.51 kg/m   BP Readings from Last 3 Encounters:  08/20/23 130/79  07/23/23 (!) 101/53  05/11/23 137/63    Wt Readings from Last 3 Encounters:  12/18/22 220 lb (99.8 kg)  12/15/22 220 lb (99.8 kg)  12/11/21 230 lb (104.3 kg)     Physical Exam Constitutional:      General: She is not in acute distress.    Appearance: She is well-developed.  Cardiovascular:     Rate and Rhythm: Normal rate and regular rhythm.  Pulmonary:     Breath sounds: Normal breath sounds.  Musculoskeletal:     Comments: In wheelchair due to loss of right knee joint from sepsis (has a rod in place  Skin:    General: Skin is warm and dry.  Neurological:     Mental Status: She is alert and oriented to person, place, and time.       Assessment & Plan:   [name] was seen today for follow-up.  Diagnoses and all orders for this visit:  Controlled type 2 diabetes mellitus with chronic kidney disease,  without long-term current use of insulin, unspecified CKD stage (HCC) -     CBC with Differential/Platelet -     CMP14+EGFR  Essential hypertension -     CBC with Differential/Platelet -     CMP14+EGFR  Chronic combined systolic and diastolic congestive heart failure (HCC) -     CBC with Differential/Platelet -     CMP14+EGFR -     Brain natriuretic peptide       I am having [name] maintain her acetaminophen, ferrous sulfate, atorvastatin,  Nebivolol HCl, isosorbide dinitrate, diltiazem, olmesartan, colchicine, furosemide, hydrALAZINE, and empagliflozin.  Allergies as of 08/20/2023       Reactions   Aleve [naproxen Sodium] Other (See Comments)   Heart races   Asa [aspirin] Other (See Comments)   Nose bleeding   Ciprofloxacin Other (See Comments)   Possible hamstring tendinopathy   Clonidine Derivatives Other (See Comments)   Dizziness and weakness   Shellfish Allergy Nausea And Vomiting        Medication List        Accurate as of August 20, 2023  3:39 PM. If you have any questions, ask your nurse or doctor.          acetaminophen 500 MG tablet Commonly known as: TYLENOL Take 2 tablets (1,000 mg total) by mouth every 6 (six) hours as needed for mild pain.   atorvastatin 20 MG tablet Commonly known as: LIPITOR Take 1 tablet (20 mg total) by mouth every evening.   colchicine 0.6 MG tablet Take twice daily for gout attack. (may take every two hours up to 6 doses at acute onset)   diltiazem 240 MG 24 hr capsule Commonly known as: CARDIZEM CD Take 1 capsule (240 mg total) by mouth daily.   ferrous sulfate 325 (65 FE) MG tablet Take 325 mg by mouth daily.   furosemide 20 MG tablet Commonly known as: LASIX Take 1 tablet (20 mg total) by mouth daily.   hydrALAZINE 25 MG tablet Commonly known as: APRESOLINE Take 1 tablet (25 mg total) by mouth 3 (three) times daily.   isosorbide dinitrate 10 MG tablet Commonly known as: ISORDIL Take 1 tablet (10 mg total) by mouth 3 (three) times daily.   Jardiance 10 MG Tabs tablet Generic drug: empagliflozin Take 10 mg by mouth.   Nebivolol HCl 20 MG Tabs TAKE ONE (1) TABLET EACH DAY   olmesartan 40 MG tablet Commonly known as: BENICAR TAKE ONE (1) TABLET EACH DAY         Follow-up: Return in about 2 months (around 10/20/2023).  [name], M.D.

## 2023-08-21 LAB — CMP14+EGFR
ALT: 8 IU/L (ref 0–32)
AST: 15 IU/L (ref 0–40)
Albumin: 4.2 g/dL (ref 3.8–4.8)
Alkaline Phosphatase: 147 IU/L — ABNORMAL HIGH (ref 44–121)
BUN/Creatinine Ratio: 20 (ref 12–28)
BUN: 32 mg/dL — ABNORMAL HIGH (ref 8–27)
Bilirubin Total: 0.5 mg/dL (ref 0.0–1.2)
CO2: 22 mmol/L (ref 20–29)
Calcium: 9.3 mg/dL (ref 8.7–10.3)
Chloride: 101 mmol/L (ref 96–106)
Creatinine, Ser: 1.59 mg/dL — ABNORMAL HIGH (ref 0.57–1.00)
Globulin, Total: 2.9 g/dL (ref 1.5–4.5)
Glucose: 108 mg/dL — ABNORMAL HIGH (ref 70–99)
Potassium: 4.8 mmol/L (ref 3.5–5.2)
Sodium: 137 mmol/L (ref 134–144)
Total Protein: 7.1 g/dL (ref 6.0–8.5)
eGFR: 33 mL/min/{1.73_m2} — ABNORMAL LOW (ref >59)

## 2023-08-21 LAB — CBC WITH DIFFERENTIAL/PLATELET
Basophils Absolute: 0 10*3/uL (ref 0.0–0.2)
Basos: 1 %
EOS (ABSOLUTE): 0.2 10*3/uL (ref 0.0–0.4)
Eos: 3 %
Hematocrit: 37.8 % (ref 34.0–46.6)
Hemoglobin: 11.4 g/dL (ref 11.1–15.9)
Immature Grans (Abs): 0 10*3/uL (ref 0.0–0.1)
Immature Granulocytes: 0 %
Lymphocytes Absolute: 1.1 10*3/uL (ref 0.7–3.1)
Lymphs: 20 %
MCH: 25.6 pg — ABNORMAL LOW (ref 26.6–33.0)
MCHC: 30.2 g/dL — ABNORMAL LOW (ref 31.5–35.7)
MCV: 85 fL (ref 79–97)
Monocytes Absolute: 0.6 10*3/uL (ref 0.1–0.9)
Monocytes: 12 %
Neutrophils Absolute: 3.3 10*3/uL (ref 1.4–7.0)
Neutrophils: 64 %
Platelets: 320 10*3/uL (ref 150–450)
RBC: 4.46 x10E6/uL (ref 3.77–5.28)
RDW: 14.6 % (ref 11.7–15.4)
WBC: 5.2 10*3/uL (ref 3.4–10.8)

## 2023-08-21 LAB — BRAIN NATRIURETIC PEPTIDE: BNP: 63.3 pg/mL (ref 0.0–100.0)

## 2023-08-24 ENCOUNTER — Encounter: Payer: Self-pay | Admitting: Family Medicine

## 2023-08-24 DIAGNOSIS — N179 Acute kidney failure, unspecified: Secondary | ICD-10-CM | POA: Diagnosis not present

## 2023-08-24 DIAGNOSIS — I110 Hypertensive heart disease with heart failure: Secondary | ICD-10-CM | POA: Diagnosis not present

## 2023-08-24 DIAGNOSIS — R32 Unspecified urinary incontinence: Secondary | ICD-10-CM | POA: Diagnosis not present

## 2023-08-24 DIAGNOSIS — M47816 Spondylosis without myelopathy or radiculopathy, lumbar region: Secondary | ICD-10-CM | POA: Diagnosis not present

## 2023-08-24 DIAGNOSIS — I5033 Acute on chronic diastolic (congestive) heart failure: Secondary | ICD-10-CM | POA: Diagnosis not present

## 2023-08-24 DIAGNOSIS — J449 Chronic obstructive pulmonary disease, unspecified: Secondary | ICD-10-CM | POA: Diagnosis not present

## 2023-08-24 DIAGNOSIS — E119 Type 2 diabetes mellitus without complications: Secondary | ICD-10-CM | POA: Diagnosis not present

## 2023-08-24 DIAGNOSIS — M48061 Spinal stenosis, lumbar region without neurogenic claudication: Secondary | ICD-10-CM | POA: Diagnosis not present

## 2023-08-25 LAB — IRON AND TIBC
Iron Saturation: 12 % — ABNORMAL LOW (ref 15–55)
Iron: 28 ug/dL (ref 27–139)
Total Iron Binding Capacity: 232 ug/dL — ABNORMAL LOW (ref 250–450)
UIBC: 204 ug/dL (ref 118–369)

## 2023-08-25 LAB — SPECIMEN STATUS REPORT

## 2023-08-25 LAB — FERRITIN: Ferritin: 103 ng/mL (ref 15–150)

## 2023-08-26 ENCOUNTER — Other Ambulatory Visit: Payer: Self-pay | Admitting: Family Medicine

## 2023-09-22 ENCOUNTER — Encounter: Payer: Self-pay | Admitting: Family Medicine

## 2023-09-29 ENCOUNTER — Other Ambulatory Visit: Payer: Self-pay | Admitting: Family Medicine

## 2023-09-29 ENCOUNTER — Other Ambulatory Visit: Payer: Self-pay | Admitting: *Deleted

## 2023-09-29 ENCOUNTER — Telehealth: Payer: Self-pay | Admitting: Family Medicine

## 2023-09-29 MED ORDER — EMPAGLIFLOZIN 10 MG PO TABS: 10.0000 mg | ORAL_TABLET | Freq: Every day | ORAL | 3 refills | Status: DC

## 2023-09-29 NOTE — Telephone Encounter (Signed)
Patient is calling to see if she needs to continue taking her Jardiance.  If so, she needs to have a refill called in to [facility].

## 2023-09-29 NOTE — Telephone Encounter (Signed)
Refill sent to pharmacy.   

## 2023-10-05 ENCOUNTER — Other Ambulatory Visit: Payer: Self-pay | Admitting: Family Medicine

## 2023-10-11 NOTE — Progress Notes (Unsigned)
Cardiology Office Note:   Date:  10/14/2023  ID:  Beth [name], DOB [birth year], MRN [id number] PCP: [name], MD  [facility] Providers Cardiologist:  None {  History of Present Illness:   Beth [name] is a 77 y.o. female  who was referred by [name], MD for evaluation of a heart murmur.  She has no past cardiac history.  She has had 7 knee surgeries so she gets around very slowly.   I last saw her in 2022.    She was at [facility] in August apparently with respiratory failure.  I cannot see all of the notes but I can see that she had an echocardiogram which demonstrated EF to be about 50 to 55%.  She had mild MR.  Looks like amlodipine was discontinued during that visit.  She was started on Jardiance and furosemide.  I think she had acute on chronic diastolic heart failure and was hospitalized for couple of days.  Since going home she had no acute symptoms.  She weighs herself some days and I think her weights been stable.  He had no new lower extremity swelling.  She does not drink excess fluid.  She does not really try excess salt but a lot of her food already has salt in it.  She is not describing palpitations, presyncope or syncope.  She is not describing PND or orthopnea.  She gets around very slowly with her walker with her knee problem.  She will get dyspneic walking short distance but this is not new.  ROS: As stated in the HPI and negative for all other systems.  Studies Reviewed:    EKG:   EKG Interpretation Date/Time:  Wednesday October 14 2023 14:16:15 EST Ventricular Rate:  51 PR Interval:  306 QRS Duration:  106 QT Interval:  474 QTC Calculation: 436 R Axis:   -41  Text Interpretation: Sinus bradycardia with sinus arrhythmia with 1st degree A-V block Left axis deviation Moderate voltage criteria for LVH, may be normal variant ( R in aVL , Cornell product ) Anterior infarct , age undetermined When compared with ECG of 11-Dec-2019 19:55, No significant  change since last tracing Confirmed by Rollene Rotunda ([id number]) on 10/14/2023 2:56:46 PM    Risk Assessment/Calculations:              Physical Exam:   VS:  BP 100/70   Pulse (!) 52   Ht 5\' 5"  (1.651 m)   Wt 228 lb (103.4 kg)   BMI 37.94 kg/m    Wt Readings from Last 3 Encounters:  10/14/23 228 lb (103.4 kg)  12/18/22 220 lb (99.8 kg)  12/15/22 220 lb (99.8 kg)     GEN: Well nourished, well developed in no acute distress NECK: No JVD; No carotid bruits CARDIAC: RRR, distant heart sounds and I do not appreciate a murmur, rubs or gallops  RESPIRATORY:  Clear to auscultation without rales, wheezing or rhonchi  ABDOMEN: Soft, non-tender, non-distended EXTREMITIES: Mild bilateral lower extremity edema; No deformity   ASSESSMENT AND PLAN:   Murmur: While I do not appreciate a murmur at this time she has had some mild MR in the past but had recent echo as above.  No change in therapy.    Hypertension:   The blood pressure is running low but she does not have any symptoms related to this.  For now would continue the meds as listed but she is going to keep a blood pressure diary and if  her heart rate is running lower blood pressure running low I might have to back off medications.    Hyperlipidemia: LDL was 81 with an HDL of 56.  No change in therapy.  CKD IIIB:  Creatinine in the hospital was 2.0.  I am going to repeat a basic metabolic profile today.  Acute on chronic diastolic heart failure: I did review hospital records [facility] she has a low normal EF.  We talked about salt and fluid restriction.  For now can continue her on the meds as listed but will need to keep close follow-up of her renal function.  She is going to do daily weights.       Follow up with me in March 2025  Signed, Rollene Rotunda, MD

## 2023-10-14 ENCOUNTER — Encounter: Payer: Self-pay | Admitting: Cardiology

## 2023-10-14 ENCOUNTER — Other Ambulatory Visit: Payer: Self-pay | Admitting: *Deleted

## 2023-10-14 ENCOUNTER — Ambulatory Visit: Payer: Medicare HMO | Admitting: Cardiology

## 2023-10-14 VITALS — BP 100/70 | HR 52 | Ht 65.0 in | Wt 228.0 lb

## 2023-10-14 DIAGNOSIS — R011 Cardiac murmur, unspecified: Secondary | ICD-10-CM

## 2023-10-14 DIAGNOSIS — I10 Essential (primary) hypertension: Secondary | ICD-10-CM

## 2023-10-14 DIAGNOSIS — E785 Hyperlipidemia, unspecified: Secondary | ICD-10-CM | POA: Diagnosis not present

## 2023-10-14 DIAGNOSIS — Z79899 Other long term (current) drug therapy: Secondary | ICD-10-CM | POA: Diagnosis not present

## 2023-10-14 NOTE — Patient Instructions (Signed)
Medication Instructions:  The current medical regimen is effective;  continue present plan and medications.  *If you need a refill on your cardiac medications before your next appointment, please call your pharmacy*   Lab Work: Please have blood work today (CBC,BMP)  If you have labs (blood work) drawn today and your tests are completely normal, you will receive your results only by: MyChart Message (if you have MyChart) OR A paper copy in the mail If you have any lab test that is abnormal or we need to change your treatment, we will call you to review the results.  Follow-Up: At [facility], you and your health needs are our priority.  As part of our continuing mission to provide you with exceptional heart care, we have created designated Provider Care Teams.  These Care Teams include your primary Cardiologist (physician) and Advanced Practice Providers (APPs -  Physician Assistants and Nurse Practitioners) who all work together to provide you with the care you need, when you need it.  We recommend signing up for the patient portal called "MyChart".  Sign up information is provided on this After Visit Summary.  MyChart is used to connect with patients for Virtual Visits (Telemedicine).  Patients are able to view lab/test results, encounter notes, upcoming appointments, etc.  Non-urgent messages can be sent to your provider as well.   To learn more about what you can do with MyChart, go to [email or web address].    Your next appointment:   4 month(s)  Provider:   Rollene Rotunda, MD

## 2023-10-21 ENCOUNTER — Ambulatory Visit (INDEPENDENT_AMBULATORY_CARE_PROVIDER_SITE_OTHER): Payer: Medicare HMO | Admitting: Family Medicine

## 2023-10-21 ENCOUNTER — Other Ambulatory Visit: Payer: Medicare HMO

## 2023-10-21 ENCOUNTER — Encounter: Payer: Self-pay | Admitting: Family Medicine

## 2023-10-21 VITALS — BP 144/73 | HR 57 | Temp 97.8°F

## 2023-10-21 DIAGNOSIS — E782 Mixed hyperlipidemia: Secondary | ICD-10-CM | POA: Diagnosis not present

## 2023-10-21 DIAGNOSIS — E1122 Type 2 diabetes mellitus with diabetic chronic kidney disease: Secondary | ICD-10-CM

## 2023-10-21 DIAGNOSIS — N184 Chronic kidney disease, stage 4 (severe): Secondary | ICD-10-CM

## 2023-10-21 DIAGNOSIS — I10 Essential (primary) hypertension: Secondary | ICD-10-CM | POA: Diagnosis not present

## 2023-10-21 LAB — BAYER DCA HB A1C WAIVED: HB A1C (BAYER DCA - WAIVED): 5.7 % — ABNORMAL HIGH (ref 4.8–5.6)

## 2023-10-21 NOTE — Progress Notes (Signed)
Subjective:  Patient ID: Beth [name], female    DOB: [birth year]  Age: 77 y.o. MRN: [id number]  CC: Follow-up   HPI Beth [name] presents for  presents for  follow-up of hypertension. Patient has no history of headache chest pain or shortness of breath or recent cough. Patient also denies symptoms of TIA such as focal numbness or weakness. Patient denies side effects from medication. States taking it regularly.  presents forFollow-up of diabetes. Patient denies symptoms such as polyuria, polydipsia, excessive hunger, nausea No significant hypoglycemic spells noted. Medications reviewed. Pt reports taking them regularly without complication/adverse reaction being reported today.   Lab Results  Component Value Date   HGBA1C 5.7 (H) 10/21/2023   HGBA1C 5.9 (H) 07/23/2023   HGBA1C 5.7 (H) 12/18/2022    in for follow-up of elevated cholesterol. Doing well without complaints on current medication. Denies side effects of statin including myalgia and arthralgia and nausea. Currently no chest pain, shortness of breath or other cardiovascular related symptoms noted.       08/20/2023    3:09 PM 07/23/2023    3:16 PM 07/23/2023    3:05 PM  Depression screen PHQ 2/9  Decreased Interest 0 0 0  Down, Depressed, Hopeless 0 0 0  PHQ - 2 Score 0 0 0    History Simone has a past medical history of Anemia, Arthritis, Blood transfusion without reported diagnosis (2012), Cataract, CKD (chronic kidney disease), stage III (HCC), Diabetes mellitus without complication (HCC), Family history of anesthesia complication, Gout, Herpes infection (08/09/2014), Hyperlipidemia, Hypertension, Nocturia, Osteoarthritis of both sacroiliac joints (HCC) (08/22/2019), Osteomyelitis of right tibia (HCC) (07/23/2020), Other acute osteomyelitis, right femur (HCC) (07/23/2020), Pseudomonas aeruginosa infection (12/15/2018), and Stroke (HCC) (2006).   She has a past surgical history that includes Carpal tunnel release (Right,  1983); colonscopy (June 21, 2014); nasal cauterization (2012); Total knee arthroplasty (Right, 07/03/2014); Patellar tendon repair (Right, 08/11/2014); Joint replacement (06/2014); I & D knee with poly exchange (Right, 10/02/2014); Excisional total knee arthroplasty with antibiotic spacers (Right, 02/16/2015); Reimplantation of total knee (Right, 05/23/2015); Abdominal hysterectomy (1983); Tubal ligation; Incision and drainage of wound (Right, 01/14/2017); Excisional total knee arthroplasty with antibiotic spacers (Right, 11/03/2018); and I & D knee with poly exchange (Right, 08/27/2020).   Her family history includes Cancer in her mother; Diabetes in her son and son; Heart disease (age of onset: 58) in her daughter; Heart disease (age of onset: 61) in her brother; Hypertension in her father; Kidney disease in her daughter; Ovarian cancer in her mother; Peripheral vascular disease in her father.She reports that she has never smoked. She has never used smokeless tobacco. She reports that she does not drink alcohol and does not use drugs.    ROS Review of Systems  Constitutional: Negative.   HENT: Negative.    Eyes:  Negative for visual disturbance.  Respiratory:  Negative for shortness of breath.   Cardiovascular:  Negative for chest pain.  Gastrointestinal:  Negative for abdominal pain.  Musculoskeletal:  Positive for arthralgias.    Objective:  BP (!) 144/73   Pulse (!) 57   Temp 97.8 F (36.6 C)   SpO2 96%   BP Readings from Last 3 Encounters:  10/21/23 (!) 144/73  10/14/23 100/70  08/20/23 130/79    Wt Readings from Last 3 Encounters:  10/14/23 228 lb (103.4 kg)  12/18/22 220 lb (99.8 kg)  12/15/22 220 lb (99.8 kg)     Physical Exam Constitutional:      General:  She is not in acute distress.    Appearance: She is well-developed.  Cardiovascular:     Rate and Rhythm: Normal rate and regular rhythm.  Pulmonary:     Breath sounds: Normal breath sounds.  Musculoskeletal:         General: Normal range of motion.  Skin:    General: Skin is warm and dry.  Neurological:     Mental Status: She is alert and oriented to person, place, and time.       Assessment & Plan:   [name] was seen today for follow-up.  Diagnoses and all orders for this visit:  Controlled type 2 diabetes mellitus with stage 4 chronic kidney disease, without long-term current use of insulin (HCC) -     Bayer DCA Hb A1c Waived  Essential hypertension -     CBC with Differential/Platelet -     CMP14+EGFR  Mixed hyperlipidemia -     Lipid panel       I have discontinued Lewis Moccasin. Arvidson's furosemide and hydrALAZINE. I am also having her maintain her acetaminophen, ferrous sulfate, diltiazem, olmesartan, colchicine, Nebivolol HCl, atorvastatin, empagliflozin, and isosorbide dinitrate.  Allergies as of 10/21/2023       Reactions   Aleve [naproxen Sodium] Other (See Comments)   Heart races   Asa [aspirin] Other (See Comments)   Nose bleeding   Ciprofloxacin Other (See Comments)   Possible hamstring tendinopathy   Clonidine Derivatives Other (See Comments)   Dizziness and weakness   Shellfish Allergy Nausea And Vomiting        Medication List        Accurate as of October 21, 2023 11:59 PM. If you have any questions, ask your nurse or doctor.          STOP taking these medications    furosemide 20 MG tablet Commonly known as: LASIX Stopped by: Uchechi Denison   hydrALAZINE 25 MG tablet Commonly known as: APRESOLINE Stopped by: Zaul Hubers       TAKE these medications    acetaminophen 500 MG tablet Commonly known as: TYLENOL Take 2 tablets (1,000 mg total) by mouth every 6 (six) hours as needed for mild pain.   atorvastatin 20 MG tablet Commonly known as: LIPITOR TAKE 1 TABLET BY MOUTH EVERY EVENING   colchicine 0.6 MG tablet Take twice daily for gout attack. (may take every two hours up to 6 doses at acute onset)   diltiazem 240 MG 24 hr  capsule Commonly known as: CARDIZEM CD Take 1 capsule (240 mg total) by mouth daily.   empagliflozin 10 MG Tabs tablet Commonly known as: Jardiance Take 1 tablet (10 mg total) by mouth daily.   ferrous sulfate 325 (65 FE) MG tablet Take 325 mg by mouth daily.   isosorbide dinitrate 10 MG tablet Commonly known as: ISORDIL TAKE ONE (1) TABLET BY MOUTH 3 TIMES DAILY   Nebivolol HCl 20 MG Tabs TAKE ONE (1) TABLET EACH DAY   olmesartan 40 MG tablet Commonly known as: BENICAR TAKE ONE (1) TABLET EACH DAY         Follow-up: Return in about 3 months (around 01/21/2024).  [name], M.D.

## 2023-10-22 LAB — CMP14+EGFR
ALT: 7 [IU]/L (ref 0–32)
AST: 16 [IU]/L (ref 0–40)
Albumin: 4.1 g/dL (ref 3.8–4.8)
Alkaline Phosphatase: 145 IU/L — ABNORMAL HIGH (ref 44–121)
BUN/Creatinine Ratio: 22 (ref 12–28)
BUN: 38 mg/dL — ABNORMAL HIGH (ref 8–27)
Bilirubin Total: 0.7 mg/dL (ref 0.0–1.2)
CO2: 20 mmol/L (ref 20–29)
Calcium: 9.1 mg/dL (ref 8.7–10.3)
Chloride: 105 mmol/L (ref 96–106)
Creatinine, Ser: 1.74 mg/dL — ABNORMAL HIGH (ref 0.57–1.00)
Globulin, Total: 3.3 g/dL (ref 1.5–4.5)
Glucose: 96 mg/dL (ref 70–99)
Potassium: 4.2 mmol/L (ref 3.5–5.2)
Sodium: 141 mmol/L (ref 134–144)
Total Protein: 7.4 g/dL (ref 6.0–8.5)
eGFR: 29 mL/min/{1.73_m2} — ABNORMAL LOW (ref >59)

## 2023-10-22 LAB — LIPID PANEL
Chol/HDL Ratio: 3 ratio (ref 0.0–4.4)
Cholesterol, Total: 165 mg/dL (ref 100–199)
HDL: 55 mg/dL (ref >39)
LDL Chol Calc (NIH): 94 mg/dL (ref 0–99)
Triglycerides: 86 mg/dL (ref 0–149)
VLDL Cholesterol Cal: 16 mg/dL (ref 5–40)

## 2023-10-22 LAB — CBC WITH DIFFERENTIAL/PLATELET
Basophils Absolute: 0 10*3/uL (ref 0.0–0.2)
Basos: 1 %
EOS (ABSOLUTE): 0.2 10*3/uL (ref 0.0–0.4)
Eos: 3 %
Hematocrit: 40.9 % (ref 34.0–46.6)
Hemoglobin: 12.3 g/dL (ref 11.1–15.9)
Immature Grans (Abs): 0 10*3/uL (ref 0.0–0.1)
Immature Granulocytes: 0 %
Lymphocytes Absolute: 1.4 10*3/uL (ref 0.7–3.1)
Lymphs: 25 %
MCH: 25 pg — ABNORMAL LOW (ref 26.6–33.0)
MCHC: 30.1 g/dL — ABNORMAL LOW (ref 31.5–35.7)
MCV: 83 fL (ref 79–97)
Monocytes Absolute: 0.5 10*3/uL (ref 0.1–0.9)
Monocytes: 9 %
Neutrophils Absolute: 3.5 10*3/uL (ref 1.4–7.0)
Neutrophils: 62 %
Platelets: 294 10*3/uL (ref 150–450)
RBC: 4.92 x10E6/uL (ref 3.77–5.28)
RDW: 14.3 % (ref 11.7–15.4)
WBC: 5.6 10*3/uL (ref 3.4–10.8)

## 2023-10-23 NOTE — Progress Notes (Signed)
Hello Caelyn,  Your lab result is normal and/or stable.Some minor variations that are not significant are commonly marked abnormal, but do not represent any medical problem for you.  Best regards, Ozias Dicenzo, M.D.

## 2023-11-12 ENCOUNTER — Other Ambulatory Visit: Payer: Self-pay | Admitting: Family Medicine

## 2023-11-17 ENCOUNTER — Telehealth: Payer: Self-pay | Admitting: Family Medicine

## 2023-11-17 DIAGNOSIS — H5203 Hypermetropia, bilateral: Secondary | ICD-10-CM | POA: Diagnosis not present

## 2023-11-17 DIAGNOSIS — H35363 Drusen (degenerative) of macula, bilateral: Secondary | ICD-10-CM | POA: Diagnosis not present

## 2023-11-17 DIAGNOSIS — H25813 Combined forms of age-related cataract, bilateral: Secondary | ICD-10-CM | POA: Diagnosis not present

## 2023-11-17 DIAGNOSIS — H52223 Regular astigmatism, bilateral: Secondary | ICD-10-CM | POA: Diagnosis not present

## 2023-11-17 DIAGNOSIS — H524 Presbyopia: Secondary | ICD-10-CM | POA: Diagnosis not present

## 2023-11-17 DIAGNOSIS — Z0279 Encounter for issue of other medical certificate: Secondary | ICD-10-CM

## 2023-11-17 NOTE — Telephone Encounter (Signed)
Please call patient once Handicap form is completed so that she can come by and pick up. Fee has been paid. Form is in [employer].

## 2023-11-18 NOTE — Telephone Encounter (Signed)
[facility] handicap form ready

## 2023-11-30 ENCOUNTER — Other Ambulatory Visit: Payer: Self-pay | Admitting: Family Medicine

## 2023-12-21 ENCOUNTER — Other Ambulatory Visit: Payer: Self-pay | Admitting: Family Medicine

## 2023-12-30 ENCOUNTER — Telehealth: Payer: Self-pay

## 2023-12-30 ENCOUNTER — Ambulatory Visit: Payer: Medicare HMO

## 2023-12-30 VITALS — Ht 65.0 in | Wt 228.0 lb

## 2023-12-30 DIAGNOSIS — Z780 Asymptomatic menopausal state: Secondary | ICD-10-CM

## 2023-12-30 DIAGNOSIS — Z0000 Encounter for general adult medical examination without abnormal findings: Secondary | ICD-10-CM | POA: Diagnosis not present

## 2023-12-30 NOTE — Progress Notes (Signed)
Subjective:   Beth [name] is a 78 y.o. female who presents for Medicare Annual (Subsequent) preventive examination.  Visit Complete: Virtual I connected with  Landry Dyke on 12/30/23 by a audio enabled telemedicine application and verified that I am speaking with the correct person using two identifiers.  Patient Location: Home  Provider Location: Home Office  This patient declined Interactive audio and video telecommunications. Therefore the visit was completed with audio only.  I discussed the limitations of evaluation and management by telemedicine. The patient expressed understanding and agreed to proceed.  Vital Signs: Because this visit was a virtual/telehealth visit, some criteria may be missing or patient reported. Any vitals not documented were not able to be obtained and vitals that have been documented are patient reported.  Cardiac Risk Factors include: advanced age (>40men, >27 women);dyslipidemia;hypertension;sedentary lifestyle;diabetes mellitus     Objective:    Today's Vitals   12/30/23 1211  Weight: 228 lb (103.4 kg)  Height: 5\' 5"  (1.651 m)   Body mass index is 37.94 kg/m.     12/30/2023    3:11 PM 12/15/2022    2:48 PM 12/11/2021    2:27 PM 08/27/2020    7:00 PM 08/21/2020    2:54 PM 04/02/2020   11:41 AM 12/11/2019   10:49 PM  Advanced Directives  Does Patient Have a Medical Advance Directive? No No No Yes Yes No No  Type of Advance Directive    Healthcare Power of [employer] Power of Attorney    Does patient want to make changes to medical advance directive?    No - Patient declined     Would patient like information on creating a medical advance directive? Yes (MAU/Ambulatory/Procedural Areas - Information given) No - Patient declined No - Patient declined   No - Patient declined No - Patient declined    Current Medications (verified) Outpatient Encounter Medications as of 12/30/2023  Medication Sig   acetaminophen (TYLENOL) 500 MG tablet Take  2 tablets (1,000 mg total) by mouth every 6 (six) hours as needed for mild pain.   atorvastatin (LIPITOR) 20 MG tablet TAKE 1 TABLET BY MOUTH EVERY EVENING   colchicine 0.6 MG tablet Take twice daily for gout attack. (may take every two hours up to 6 doses at acute onset)   diltiazem (CARDIZEM CD) 240 MG 24 hr capsule TAKE ONE CAPSULE BY MOUTH DAILY   empagliflozin (JARDIANCE) 10 MG TABS tablet Take 1 tablet (10 mg total) by mouth daily.   ferrous sulfate 325 (65 FE) MG tablet Take 325 mg by mouth daily.   isosorbide dinitrate (ISORDIL) 10 MG tablet TAKE ONE (1) TABLET BY MOUTH 3 TIMES DAILY   Nebivolol HCl 20 MG TABS TAKE ONE (1) TABLET EACH DAY   olmesartan (BENICAR) 40 MG tablet TAKE ONE (1) TABLET EACH DAY   No facility-administered encounter medications on file as of 12/30/2023.    Allergies (verified) Aleve [naproxen sodium], Asa [aspirin], Ciprofloxacin, Clonidine derivatives, and Shellfish allergy   History: Past Medical History:  Diagnosis Date   Anemia    Arthritis    Knee both knees   Blood transfusion without reported diagnosis 2012   anemia;pt denies transfusion stated was only on iron tablet   Cataract    left   CKD (chronic kidney disease), stage III (HCC)    Diabetes mellitus without complication (HCC)    Family history of anesthesia complication    sister very slow to awaken after anesthesia;severe vomiting    Gout  left elbow   Herpes infection 08/09/2014   Saw doctor Wed. 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   Other acute osteomyelitis, right femur (HCC) 07/23/2020   Pseudomonas aeruginosa infection 12/15/2018   Stroke (HCC) 2006   x 1 no deficits noted    Past Surgical History:  Procedure Laterality Date   ABDOMINAL HYSTERECTOMY  1983   CARPAL TUNNEL RELEASE Right 1983   colonscopy  June 21, 2014   EXCISIONAL TOTAL KNEE ARTHROPLASTY WITH ANTIBIOTIC SPACERS Right 02/16/2015   Procedure: RIGHT KNEE RESECTION ARTHROPLASTY WITH ANTIBIOTIC SPACERS;  Surgeon: Ollen Gross, MD;  Location: [facility];  Service: Orthopedics;  Laterality: Right;   EXCISIONAL TOTAL KNEE ARTHROPLASTY WITH ANTIBIOTIC SPACERS Right 11/03/2018   Procedure: Right knee resection arthroplasty; antibiotic spacer;  Surgeon: Ollen Gross, MD;  Location: [facility];  Service: Orthopedics;  Laterality: Right;  Adductor Block   I & D KNEE WITH POLY EXCHANGE Right 10/02/2014   Procedure: IRRIGATION AND DEBRIDEMENT RIGHT KNEE WITH POLY EXCHANGE;  Surgeon: Loanne Drilling, MD;  Location: [facility];  Service: Orthopedics;  Laterality: Right;   I & D KNEE WITH POLY EXCHANGE Right 08/27/2020   Procedure: IRRIGATION AND DEBRIDEMENT; SPACER EXCHANGE RIGHT KNEE with multiple specimens;  Surgeon: Ollen Gross, MD;  Location: [facility];  Service: Orthopedics;  Laterality: Right;    INCISION AND DRAINAGE OF WOUND Right 01/14/2017   Procedure: IRRIGATION AND DEBRIDEMENT WOUND;  Surgeon: Ollen Gross, MD;  Location: [facility];  Service: Orthopedics;  Laterality: Right;  requests   JOINT REPLACEMENT  06/2014   right knee   nasal cauterization  2012   PATELLAR TENDON REPAIR Right 08/11/2014   Procedure: RIGHT PATELLA TENDON REPAIR;  Surgeon: Loanne Drilling, MD;  Location: [facility];  Service: Orthopedics;  Laterality: Right;   REIMPLANTATION OF TOTAL KNEE Right 05/23/2015   Procedure: RIGHT KNEE ARTHROPLASTY REIMPLANTATION;  Surgeon: Ollen Gross, MD;  Location: [facility];  Service: Orthopedics;  Laterality: Right;   TOTAL KNEE ARTHROPLASTY Right 07/03/2014   Procedure: RIGHT TOTAL KNEE ARTHROPLASTY;  Surgeon: Loanne Drilling, MD;  Location: [facility];  Service: Orthopedics;  Laterality: Right;   TUBAL LIGATION     Family History  Problem Relation Age of Onset   Ovarian cancer Mother    Cancer Mother    Peripheral vascular disease Father         with amputation of both legs   Hypertension Father    Heart disease Brother 15   Kidney disease Daughter    Heart disease Daughter 27   Diabetes Son    Diabetes Son    Colon cancer Neg Hx    Esophageal cancer Neg Hx    Stomach cancer Neg Hx    Rectal cancer Neg Hx    Social History   Socioeconomic History   Marital status: Widowed    Spouse name: Not on file   Number of children: 9   Years of education: 8th   Highest education level: 8th grade  Occupational History    Employer: RETIRED  Tobacco Use   Smoking status: Never   Smokeless tobacco: Never  Vaping Use   Vaping status: Never Used  Substance and Sexual Activity   Alcohol use: No   Drug use: No   Sexual activity: Not Currently  Other Topics Concern   Not on file  Social History Narrative   She has 6 living adult children and 3 who have passed away and many grandchildren and great grandchildren. Patient is retired. She worked part time Education officer, environmental houses and a Research officer, trade union.    One child in [place], others in [place], [place], [place], and [place]   Social Drivers of Health   Financial Resource Strain: Low Risk  (12/30/2023)   Overall Financial Resource Strain (CARDIA)    Difficulty of Paying Living Expenses: Not hard at all  Food Insecurity: No Food Insecurity (12/30/2023)   Hunger Vital Sign    Worried About Running Out of Food in the Last Year: Never true    Ran Out of Food in the Last Year: Never true  Transportation Needs: No Transportation Needs (12/30/2023)   PRAPARE - Administrator, Civil Service (Medical): No    Lack of Transportation (Non-Medical): No  Physical Activity: Inactive (12/30/2023)   Exercise Vital Sign    Days of Exercise per Week: 0 days    Minutes of Exercise per Session: 0 min  Stress: No Stress Concern Present (12/30/2023)   [employer] of Occupational Health - Occupational Stress Questionnaire    Feeling of Stress : Not at all  Social Connections: Moderately Integrated  (12/30/2023)   Social Connection and Isolation Panel [NHANES]    Frequency of Communication with Friends and Family: More than three times a week    Frequency of Social Gatherings with Friends and Family: Three times a week    Attends Religious Services: More than 4 times per year    Active Member of Clubs or Organizations: Yes    Attends Engineer, structural: More than 4 times per year    Marital Status: Never married    Tobacco Counseling Counseling given: Not Answered   Clinical Intake:  Pre-visit preparation completed: Yes  Pain : No/denies pain     Diabetes: No  How often do you need to have someone help you when you read instructions, pamphlets, or other written materials from your doctor or pharmacy?: 1 - Never  Interpreter Needed?: No  Information entered by :: Kandis Fantasia LPN   Activities of Daily Living    12/30/2023    3:10 PM  In your present state of health, do you have any difficulty performing the following activities:  Hearing? 0  Vision? 0  Difficulty concentrating or making decisions? 0  Walking or climbing stairs? 1  Dressing or bathing? 1  Doing errands, shopping? 1  Preparing Food and eating ? N  Using the Toilet? N  In the past six months, have you accidently leaked urine? N  Do you have problems with loss of bowel control? N  Managing your Medications? N  Managing your Finances? N  Housekeeping or managing your Housekeeping? Y    Patient Care Team: [name], MD as PCP - General (Family Medicine) [name], MD (Family Medicine) Ollen Gross, MD as Consulting Physician (Orthopedic Surgery) [facility]  Indicate any recent Medical Services you may have received from other than [facility] providers in the past year (date may be approximate).     Assessment:   This is a routine wellness examination for [place].  Hearing/Vision screen Hearing Screening - Comments:: Denies hearing difficulties    Vision Screening - Comments:: Wears rx glasses - up to date with routine eye exams with [facility]     Goals Addressed  This Visit's Progress    COMPLETED: AWV       04/02/2020 AWV Goal: Fall Prevention  Over the next year, patient will decrease their risk for falls by: Using assistive devices, such as a cane or walker, as needed Identifying fall risks within their home and correcting them by: Removing throw rugs Adding handrails to stairs or ramps Removing clutter and keeping a clear pathway throughout the home Increasing light, especially at night Adding shower handles/bars Raising toilet seat Identifying potential personal risk factors for falls: Medication side effects Incontinence/urgency Vestibular dysfunction Hearing loss Musculoskeletal disorders Neurological disorders Orthostatic hypotension  04/02/2020 AWV Goal: Diabetes Management  Patient will maintain an A1C level below 8.0 Patient will not develop any diabetic foot complications Patient will not experience any hypoglycemic episodes over the next 3 months Patient will notify our office of any CBG readings outside of the provider recommended range by calling [phone] Patient will adhere to provider recommendations for diabetes management  Patient Self Management Activities take all medications as prescribed and report any negative side effects monitor and record blood sugar readings as directed adhere to a low carbohydrate diet that incorporates lean proteins, vegetables, whole grains, low glycemic fruits check feet daily noting any sores, cracks, injuries, or callous formations see PCP or podiatrist if she notices any changes in her legs, feet, or toenails Patient will visit PCP and have an A1C level checked every 3 to 6 months as directed  have a yearly eye exam to monitor for vascular changes associated with diabetes and will request that the report be sent to her pcp.  consult  with her PCP regarding any changes in her health or new or worsening symptoms       Depression Screen    12/30/2023    3:09 PM 08/20/2023    3:09 PM 07/23/2023    3:16 PM 07/23/2023    3:05 PM 05/11/2023    3:14 PM 12/18/2022    3:12 PM 12/18/2022    2:52 PM  PHQ 2/9 Scores  PHQ - 2 Score 0 0 0 0 0 0 0  PHQ- 9 Score     1      Fall Risk    12/30/2023    3:10 PM 08/20/2023    3:09 PM 07/23/2023    3:16 PM 07/23/2023    3:04 PM 05/11/2023    3:14 PM  Fall Risk   Falls in the past year? 0 1 1 0 1  Number falls in past yr: 0 1 1  0  Injury with Fall? 0 0 0  0  Risk for fall due to : Impaired balance/gait;Impaired mobility History of fall(s);Impaired balance/gait;Impaired mobility History of fall(s);Impaired balance/gait;Impaired mobility  Impaired balance/gait;Impaired mobility;Orthopedic patient  Follow up Falls prevention discussed;Education provided;Falls evaluation completed Falls evaluation completed Falls evaluation completed  Falls evaluation completed    MEDICARE RISK AT HOME: Medicare Risk at Home Any stairs in or around the home?: No If so, are there any without handrails?: No Home free of loose throw rugs in walkways, pet beds, electrical cords, etc?: Yes Adequate lighting in your home to reduce risk of falls?: Yes Life alert?: No Use of a cane, walker or w/c?: Yes Grab bars in the bathroom?: Yes Shower chair or bench in shower?: Yes Elevated toilet seat or a handicapped toilet?: Yes  TIMED UP AND GO:  Was the test performed?  No    Cognitive Function:    12/30/2017    2:54 PM 12/16/2016  4:07 PM 08/10/2015    3:58 PM  MMSE - Mini Mental State Exam  Orientation to time 5 5 5   Orientation to Place 5 5 5   Registration 3 3 3   Attention/ Calculation 5 5 5   Recall 3 3 3   Language- name 2 objects 2 2 2   Language- repeat 1 1 1   Language- follow 3 step command 3 3 3   Language- read & follow direction 1 1 1   Write a sentence 1 1 1   Copy design 1 0 1  Total score 30 29  30         12/30/2023    3:10 PM 12/15/2022    2:49 PM 04/02/2020   11:45 AM  6CIT Screen  What Year? 0 points 0 points 0 points  What month? 0 points 0 points 0 points  What time? 0 points 0 points 0 points  Count back from 20 0 points 0 points 0 points  Months in reverse 0 points 0 points 2 points  Repeat phrase 0 points 0 points 0 points  Total Score 0 points 0 points 2 points    Immunizations Immunization History  Administered Date(s) Administered   Fluad Quad(high Dose 65+) 10/30/2020, 12/18/2022   Fluad Trivalent(High Dose 65+) 08/20/2023   Influenza, High Dose Seasonal PF 08/27/2016, 09/28/2018, 09/29/2019   Influenza,inj,Quad PF,6+ Mos 11/19/2015   Influenza-Unspecified 10/23/2021   Moderna Sars-Covid-2 Vaccination 01/12/2020, 02/13/2020   PNEUMOCOCCAL CONJUGATE-20 12/12/2021   Tdap 11/03/2006    TDAP status: Due, Education has been provided regarding the importance of this vaccine. Advised may receive this vaccine at local pharmacy or Health Dept. Aware to provide a copy of the vaccination record if obtained from local pharmacy or Health Dept. Verbalized acceptance and understanding.  Flu Vaccine status: Up to date  Pneumococcal vaccine status: Up to date  Covid-19 vaccine status: Information provided on how to obtain vaccines.   Qualifies for Shingles Vaccine? Yes   Zostavax completed No   Shingrix Completed?: No.    Education has been provided regarding the importance of this vaccine. Patient has been advised to call insurance company to determine out of pocket expense if they have not yet received this vaccine. Advised may also receive vaccine at local pharmacy or Health Dept. Verbalized acceptance and understanding.  Screening Tests Health Maintenance  Topic Date Due   Zoster Vaccines- Shingrix (1 of 2) Never done   DTaP/Tdap/Td (2 - Td or Tdap) 11/03/2016   Diabetic kidney evaluation - Urine ACR  09/29/2019   COVID-19 Vaccine (3 - Moderna risk series)  03/12/2020   DEXA SCAN  10/09/2020   FOOT EXAM  12/12/2022   OPHTHALMOLOGY EXAM  12/16/2022   HEMOGLOBIN A1C  04/19/2024   Diabetic kidney evaluation - eGFR measurement  10/20/2024   Medicare Annual Wellness (AWV)  12/29/2024   Pneumonia Vaccine 66+ Years old  Completed   INFLUENZA VACCINE  Completed   HPV VACCINES  Aged Out   Colonoscopy  Discontinued   Hepatitis C Screening  Discontinued    Health Maintenance  Health Maintenance Due  Topic Date Due   Zoster Vaccines- Shingrix (1 of 2) Never done   DTaP/Tdap/Td (2 - Td or Tdap) 11/03/2016   Diabetic kidney evaluation - Urine ACR  09/29/2019   COVID-19 Vaccine (3 - Moderna risk series) 03/12/2020   DEXA SCAN  10/09/2020   FOOT EXAM  12/12/2022   OPHTHALMOLOGY EXAM  12/16/2022    Colorectal cancer screening: No longer required.   Mammogram status: No  longer required due to age and preference .  Bone Density status: Ordered today. Pt provided with contact info and advised to call to schedule appt.  Lung Cancer Screening: (Low Dose CT Chest recommended if Age 46-80 years, 20 pack-year currently smoking OR have quit w/in 15years.) does not qualify.   Lung Cancer Screening Referral: n/a  Additional Screening:  Hepatitis C Screening: does qualify; Completed 11/06/18  Vision Screening: Recommended annual ophthalmology exams for early detection of glaucoma and other disorders of the eye. Is the patient up to date with their annual eye exam?  Yes  Who is the provider or what is the name of the office in which the patient attends annual eye exams? [facility]  If pt is not established with a provider, would they like to be referred to a provider to establish care? No .   Dental Screening: Recommended annual dental exams for proper oral hygiene  Diabetic Foot Exam: Diabetic Foot Exam: Overdue, Pt has been advised about the importance in completing this exam. Pt is scheduled for diabetic foot exam on at next office  visit.  Community Resource Referral / Chronic Care Management: CRR required this visit?  No   CCM required this visit?  No     Plan:     I have personally reviewed and noted the following in the patient's chart:   Medical and social history Use of alcohol, tobacco or illicit drugs  Current medications and supplements including opioid prescriptions. Patient is not currently taking opioid prescriptions. Functional ability and status Nutritional status Physical activity Advanced directives List of other physicians Hospitalizations, surgeries, and ER visits in previous 12 months Vitals Screenings to include cognitive, depression, and falls Referrals and appointments  In addition, I have reviewed and discussed with patient certain preventive protocols, quality metrics, and best practice recommendations. A written personalized care plan for preventive services as well as general preventive health recommendations were provided to patient.     Kandis Fantasia [place], [place]   08/05/5620   After Visit Summary: (MyChart) Due to this being a telephonic visit, the after visit summary with patients personalized plan was offered to patient via MyChart   Nurse Notes: Patient is in need of new hospital bed due to current one needing major repairs.

## 2023-12-30 NOTE — Telephone Encounter (Signed)
Patient seen for AWV and is asking for order to be sent for her to receive a new hospital bed if possible; or to start the process for her to get a new one.  She has had her current hospital bed for over 5 years and states that it will not go up and down any longer.

## 2023-12-30 NOTE — Patient Instructions (Signed)
Beth [name] , Thank you for taking time to come for your Medicare Wellness Visit. I appreciate your ongoing commitment to your health goals. Please review the following plan we discussed and let me know if I can assist you in the future.   Referrals/Orders/Follow-Ups/Clinician Recommendations: Aim for 30 minutes of exercise or brisk walking, 6-8 glasses of water, and 5 servings of fruits and vegetables each day.  This is a list of the screening recommended for you and due dates:  Health Maintenance  Topic Date Due   Zoster (Shingles) Vaccine (1 of 2) Never done   DTaP/Tdap/Td vaccine (2 - Td or Tdap) 11/03/2016   Yearly kidney health urinalysis for diabetes  09/29/2019   COVID-19 Vaccine (3 - Moderna risk series) 03/12/2020   DEXA scan (bone density measurement)  10/09/2020   Complete foot exam   12/12/2022   Eye exam for diabetics  12/16/2022   Hemoglobin A1C  04/19/2024   Yearly kidney function blood test for diabetes  10/20/2024   Medicare Annual Wellness Visit  12/29/2024   Pneumonia Vaccine  Completed   Flu Shot  Completed   HPV Vaccine  Aged Out   Colon Cancer Screening  Discontinued   Hepatitis C Screening  Discontinued    Advanced directives: (ACP Link)Information on Advanced Care Planning can be found at [facility] Advance Health Care Directives Advance Health Care Directives ([email or web address]   Next Medicare Annual Wellness Visit scheduled for next year: Yes

## 2023-12-31 ENCOUNTER — Other Ambulatory Visit: Payer: Self-pay | Admitting: Family Medicine

## 2023-12-31 ENCOUNTER — Encounter: Payer: Self-pay | Admitting: Family Medicine

## 2023-12-31 DIAGNOSIS — M461 Sacroiliitis, not elsewhere classified: Secondary | ICD-10-CM

## 2023-12-31 DIAGNOSIS — Z89529 Acquired absence of unspecified knee: Secondary | ICD-10-CM | POA: Insufficient documentation

## 2023-12-31 DIAGNOSIS — I5042 Chronic combined systolic (congestive) and diastolic (congestive) heart failure: Secondary | ICD-10-CM

## 2023-12-31 NOTE — Telephone Encounter (Signed)
Order faxed for pt to [facility] pharmacy. Awaiting confirmation. LS

## 2023-12-31 NOTE — Telephone Encounter (Signed)
Order printed

## 2024-01-05 ENCOUNTER — Telehealth: Payer: Self-pay | Admitting: Family Medicine

## 2024-01-05 NOTE — Telephone Encounter (Unsigned)
Copied from CRM [id number]. Topic: General - Other >> Jan 05, 2024 12:46 PM Clayton Bibles wrote: Reason for CRM: [facility] and [employer] is requesting an order for a hospital bed for Dor[place]. Please send order to [facility] # [phone] - They did not have [facility]'s fax.

## 2024-01-06 NOTE — Telephone Encounter (Signed)
Order for hospital bed needs documentation for need, appt was made w/ PCP for 01/20/24

## 2024-01-20 ENCOUNTER — Ambulatory Visit: Payer: Medicare HMO | Admitting: Family Medicine

## 2024-01-20 ENCOUNTER — Telehealth (INDEPENDENT_AMBULATORY_CARE_PROVIDER_SITE_OTHER): Payer: Medicare HMO | Admitting: Family Medicine

## 2024-01-20 DIAGNOSIS — I5042 Chronic combined systolic (congestive) and diastolic (congestive) heart failure: Secondary | ICD-10-CM | POA: Diagnosis not present

## 2024-01-20 DIAGNOSIS — Z89529 Acquired absence of unspecified knee: Secondary | ICD-10-CM | POA: Diagnosis not present

## 2024-01-20 NOTE — Progress Notes (Signed)
Subjective:    Patient ID: Beth [name], female    DOB: 05-23-1946, 78 y.o.   MRN: [id number]   HPI: Beth [name] is a 78 y.o. female presenting for need for hospital bed. Current one is broken in the middle and causes risk of sliding off of the bed. The support underneath is broken so the bed sags to the left creating risk of falling out. Had a septic prosthetic right knee  joint that was removed several years ago. Has to have special positioning in bed. Lays on her back. Needs head to be elevated to avoid dyspnea. She is treated for heart failure. Working with [employer].to get the bed replaced. She has been using the current bed for 8 years.      12/30/2023    3:09 PM 08/20/2023    3:09 PM 07/23/2023    3:16 PM 07/23/2023    3:05 PM 05/11/2023    3:14 PM  Depression screen PHQ 2/9  Decreased Interest 0 0 0 0 0  Down, Depressed, Hopeless 0 0 0 0 0  PHQ - 2 Score 0 0 0 0 0  Altered sleeping     1  Tired, decreased energy     0  Change in appetite     0  Feeling bad or failure about yourself      0  Trouble concentrating     0  Moving slowly or fidgety/restless     0  Suicidal thoughts     0  PHQ-9 Score     1  Difficult doing work/chores     Not difficult at all     Relevant past medical, surgical, family and social history reviewed and updated as indicated.  Interim medical history since our last visit reviewed. Allergies and medications reviewed and updated.  ROS:  Review of Systems  Constitutional: Negative.   HENT: Negative.    Eyes:  Negative for visual disturbance.  Respiratory:  Negative for shortness of breath.   Cardiovascular:  Negative for chest pain.  Gastrointestinal:  Negative for abdominal pain.  Musculoskeletal:  Negative for arthralgias.     Social History   Tobacco Use  Smoking Status Never  Smokeless Tobacco Never       Objective:     Wt Readings from Last 3 Encounters:  12/30/23 228 lb (103.4 kg)  10/14/23 228 lb (103.4 kg)  12/18/22 220 lb  (99.8 kg)     Exam deferred. Video visit performed.   Assessment & Plan:   1. 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 Chronic combined systolic and diastolic congestive heart failure (HCC)     No orders of the defined types were placed in this encounter.   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 The patient was identified with two identifiers. Pt.expressed understanding and agreed to proceed. Pt. Is at home. Dr. Darlyn Read is in his office.  Follow Up Instructions:   I discussed the assessment and treatment plan with the patient. The patient was provided an opportunity to ask questions and all were answered. The patient agreed with the plan and demonstrated an understanding of the instructions.   The patient was advised to call back or seek an in-person evaluation if the symptoms worsen or if the condition fails to improve as anticipated.   Total minutes contact time: 16   Follow up plan: Return in about 3 months (around 04/18/2024).  [name], MD [facility]

## 2024-01-24 ENCOUNTER — Encounter: Payer: Self-pay | Admitting: Family Medicine

## 2024-02-10 ENCOUNTER — Encounter: Payer: Self-pay | Admitting: Hematology and Oncology

## 2024-02-12 ENCOUNTER — Other Ambulatory Visit: Payer: Self-pay | Admitting: Family Medicine

## 2024-02-12 NOTE — Telephone Encounter (Signed)
 Appt scheduled for 03/03/2024

## 2024-02-12 NOTE — Telephone Encounter (Signed)
 Stacks NTBS in May for 6 mos FU RF sent to pharmacy

## 2024-02-14 NOTE — Progress Notes (Unsigned)
  Cardiology Office Note:   Date:  02/14/2024  ID:  Beth [name], DOB 1946/03/16, MRN [id number] PCP: [name], MD  [facility] Providers Cardiologist:  None {  History of Present Illness:   Beth [name] is a 78 y.o. female who was referred by [name], MD for evaluation of a heart murmur.  She has no past cardiac history.  She has had 7 knee surgeries so she gets around very slowly.   I last saw her in 2022.     In 2024 she was at [facility] in August apparently with respiratory failure.  She had an echocardiogram which demonstrated EF to be about 50 to 55%.  She had mild MR.  Looks like amlodipine was discontinued during that visit.  She was started on Jardiance and furosemide.  She presents for follow up.  ***      ***I think she had acute on chronic diastolic heart failure and was hospitalized for couple of days.  Since going home she had no acute symptoms.  She weighs herself some days and I think her weights been stable.  He had no new lower extremity swelling.  She does not drink excess fluid.  She does not really try excess salt but a lot of her food already has salt in it.  She is not describing palpitations, presyncope or syncope.  She is not describing PND or orthopnea.  She gets around very slowly with her walker with her knee problem.  She will get dyspneic walking short distance but this is not new.    ROS: ***  Studies Reviewed:    EKG:       ***  Risk Assessment/Calculations:   {Does this patient have ATRIAL FIBRILLATION?[phone]} No BP recorded.  {Refresh Note OR Click here to enter BP  :1}***        Physical Exam:   VS:  There were no vitals taken for this visit.   Wt Readings from Last 3 Encounters:  12/30/23 228 lb (103.4 kg)  10/14/23 228 lb (103.4 kg)  12/18/22 220 lb (99.8 kg)     GEN: Well nourished, well developed in no acute distress NECK: No JVD; No carotid bruits CARDIAC: ***RR, *** murmurs, rubs, gallops RESPIRATORY:   Clear to auscultation without rales, wheezing or rhonchi  ABDOMEN: Soft, non-tender, non-distended EXTREMITIES:  No edema; No deformity   ASSESSMENT AND PLAN:   Murmur: ***  While I do not appreciate a murmur at this time she has had some mild MR in the past but had recent echo as above.  No change in therapy.    Hypertension:   *** The blood pressure is running low but she does not have any symptoms related to this.  For now would continue the meds as listed but she is going to keep a blood pressure diary and if her heart rate is running lower blood pressure running low I might have to back off medications.    Hyperlipidemia: LDL was *** 81 with an HDL of 56.  No change in therapy.   CKD IIIB:  ***  Creatinine in the hospital was 2.0.  I am going to repeat a basic metabolic profile today.       Follow up ***  Signed, Rollene Rotunda, MD

## 2024-02-17 ENCOUNTER — Ambulatory Visit: Payer: Medicare HMO | Admitting: Cardiology

## 2024-02-17 ENCOUNTER — Encounter: Payer: Self-pay | Admitting: Cardiology

## 2024-02-17 ENCOUNTER — Encounter: Payer: Self-pay | Admitting: Hematology and Oncology

## 2024-02-17 VITALS — BP 140/72 | HR 80 | Ht 65.0 in

## 2024-02-17 DIAGNOSIS — N1832 Chronic kidney disease, stage 3b: Secondary | ICD-10-CM | POA: Diagnosis not present

## 2024-02-17 DIAGNOSIS — E785 Hyperlipidemia, unspecified: Secondary | ICD-10-CM

## 2024-02-17 DIAGNOSIS — R011 Cardiac murmur, unspecified: Secondary | ICD-10-CM | POA: Diagnosis not present

## 2024-02-17 DIAGNOSIS — I10 Essential (primary) hypertension: Secondary | ICD-10-CM | POA: Diagnosis not present

## 2024-02-17 NOTE — Patient Instructions (Signed)

## 2024-02-18 ENCOUNTER — Encounter: Payer: Self-pay | Admitting: Hematology and Oncology

## 2024-03-03 ENCOUNTER — Ambulatory Visit: Admitting: Family Medicine

## 2024-03-03 ENCOUNTER — Ambulatory Visit (INDEPENDENT_AMBULATORY_CARE_PROVIDER_SITE_OTHER): Payer: Self-pay

## 2024-03-03 ENCOUNTER — Ambulatory Visit (INDEPENDENT_AMBULATORY_CARE_PROVIDER_SITE_OTHER): Payer: Self-pay | Admitting: Family Medicine

## 2024-03-03 ENCOUNTER — Encounter: Payer: Self-pay | Admitting: Family Medicine

## 2024-03-03 VITALS — BP 151/84 | HR 61 | Temp 97.4°F | Ht 65.0 in

## 2024-03-03 DIAGNOSIS — E1122 Type 2 diabetes mellitus with diabetic chronic kidney disease: Secondary | ICD-10-CM | POA: Diagnosis not present

## 2024-03-03 DIAGNOSIS — M1A9XX0 Chronic gout, unspecified, without tophus (tophi): Secondary | ICD-10-CM

## 2024-03-03 DIAGNOSIS — N184 Chronic kidney disease, stage 4 (severe): Secondary | ICD-10-CM

## 2024-03-03 DIAGNOSIS — I5042 Chronic combined systolic (congestive) and diastolic (congestive) heart failure: Secondary | ICD-10-CM

## 2024-03-03 DIAGNOSIS — I10 Essential (primary) hypertension: Secondary | ICD-10-CM

## 2024-03-03 DIAGNOSIS — E782 Mixed hyperlipidemia: Secondary | ICD-10-CM | POA: Diagnosis not present

## 2024-03-03 DIAGNOSIS — Z780 Asymptomatic menopausal state: Secondary | ICD-10-CM | POA: Diagnosis not present

## 2024-03-03 LAB — BAYER DCA HB A1C WAIVED: HB A1C (BAYER DCA - WAIVED): 5.8 % — ABNORMAL HIGH (ref 4.8–5.6)

## 2024-03-03 MED ORDER — COLCHICINE 0.6 MG PO TABS: ORAL_TABLET | ORAL | 2 refills | Status: DC

## 2024-03-03 MED ORDER — NEBIVOLOL HCL 20 MG PO TABS: ORAL_TABLET | ORAL | 1 refills | Status: DC

## 2024-03-03 NOTE — Progress Notes (Signed)
Subjective:  Patient ID: Beth [name], female    DOB: 1946-07-27  Age: 78 y.o. MRN: [id number]  CC: Medication Refill and Back Pain (Lower left side pain for about month or more. Might be from sitting but concerned about kidney. )   HPI Beth [name] presents for lower left sided back pain. Points to flank area, but also at spinalis musculature at upper lumbar area at its lateral aspect.    presents for  follow-up of hypertension. Patient has no history of headache chest pain or shortness of breath or recent cough. Patient also denies symptoms of TIA such as focal numbness or weakness. Patient denies side effects from medication. States taking it regularly.  in for follow-up of elevated cholesterol. Doing well without complaints on current medication. Denies side effects of statin including myalgia and arthralgia and nausea. Currently no chest pain, shortness of breath or other cardiovascular related symptoms noted. presents forFollow-up of diabetes. Patient denies symptoms such as polyuria, polydipsia, excessive hunger, nausea No significant hypoglycemic spells noted. Medications reviewed. Pt reports taking them regularly without complication/adverse reaction being reported today.  Lab Results  Component Value Date   HGBA1C 5.8 (H) 03/03/2024   HGBA1C 5.7 (H) 10/21/2023   HGBA1C 5.9 (H) 07/23/2023         03/03/2024    3:55 PM 12/30/2023    3:09 PM 08/20/2023    3:09 PM  Depression screen PHQ 2/9  Decreased Interest 0 0 0  Down, Depressed, Hopeless 0 0 0  PHQ - 2 Score 0 0 0  Altered sleeping 1    Tired, decreased energy 0    Change in appetite 0    Feeling bad or failure about yourself  0    Trouble concentrating 0    Moving slowly or fidgety/restless 0    Suicidal thoughts 0    PHQ-9 Score 1    Difficult doing work/chores Not difficult at all      History Beth [name] has a past medical history of Anemia, Arthritis, Blood transfusion without reported diagnosis (2012), Cataract,  CKD (chronic kidney disease), stage III (HCC), Diabetes mellitus without complication (HCC), Family history of anesthesia complication, Gout, Herpes infection (08/09/2014), Hyperlipidemia, Hypertension, 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   She has a past surgical history that includes Carpal tunnel release (Right, 1983); colonscopy (June 21, 2014); nasal cauterization (2012); Total knee arthroplasty (Right, 07/03/2014); Patellar tendon repair (Right, 08/11/2014); Joint replacement (06/2014); I & D knee with poly exchange (Right, 10/02/2014); Excisional total knee arthroplasty with antibiotic spacers (Right, 02/16/2015); Reimplantation of total knee (Right, 05/23/2015); Abdominal hysterectomy (1983); Tubal ligation; Incision and drainage of wound (Right, 01/14/2017); Excisional total knee arthroplasty with antibiotic spacers (Right, 11/03/2018); and I & D knee with poly exchange (Right, 08/27/2020).   Her family history includes Cancer in her mother; Diabetes in her son and son; Heart disease (age of onset: 81) in her daughter; Heart disease (age of onset: 37) in her brother; Hypertension in her father; Kidney disease in her daughter; Ovarian cancer in her mother; Peripheral vascular disease in her father.She reports that she has never smoked. She has never used smokeless tobacco. She reports that she does not drink alcohol and does not use drugs.    ROS  Review of Systems  Constitutional: Negative.   HENT: Negative.    Eyes:  Negative for visual disturbance.  Respiratory:  Negative for shortness of breath.   Cardiovascular:  Negative for  chest pain.  Gastrointestinal:  Negative for abdominal pain.  Musculoskeletal:  Negative for arthralgias.    Objective:  BP (!) 151/84   Pulse 61   Temp (!) 97.4 F (36.3 C)   Ht 5\' 5"  (1.651 m)   BMI 37.94 kg/m   BP Readings from Last 3 Encounters:  03/03/24 (!) 151/84  02/17/24 (!) 140/72  10/21/23 (!) 144/73    Wt Readings from Last 3 Encounters:  12/30/23 228 lb (103.4 kg)  10/14/23 228 lb (103.4 kg)  12/18/22 220 lb (99.8 kg)     Physical Exam Constitutional:      General: She is not in acute distress.    Appearance: She is well-developed.  Cardiovascular:     Rate and Rhythm: Normal rate and regular rhythm.  Pulmonary:     Breath sounds: Normal breath sounds.  Musculoskeletal:        General: Tenderness (gout at toes) and deformity (right knee joint missing) present. Normal range of motion.  Skin:    General: Skin is warm and dry.  Neurological:     Mental Status: She is alert and oriented to person, place, and time.      Assessment & Plan:  Chronic combined systolic and diastolic congestive heart failure (HCC) -     CBC with Differential/Platelet -     CMP14+EGFR  Controlled type 2 diabetes mellitus with stage 4 chronic kidney disease, without long-term current use of insulin (HCC) -     Bayer DCA Hb A1c Waived  Mixed hyperlipidemia -     Lipid panel  Essential hypertension -     CBC with Differential/Platelet -     CMP14+EGFR  Chronic gout without tophus, unspecified cause, unspecified site  Other orders -     Colchicine; Take twice daily for gout attack. (may take every two hours up to 6 doses at acute onset)  Dispense: 60 tablet; Refill: 2 -     Nebivolol HCl; TAKE ONE (1) TABLET EACH DAY  Dispense: 90 tablet; Refill: 1     Follow-up: Return in about 6 months (around 08/29/2024).  [name], M.D.

## 2024-03-04 DIAGNOSIS — M85851 Other specified disorders of bone density and structure, right thigh: Secondary | ICD-10-CM | POA: Diagnosis not present

## 2024-03-04 DIAGNOSIS — M85852 Other specified disorders of bone density and structure, left thigh: Secondary | ICD-10-CM | POA: Diagnosis not present

## 2024-03-04 DIAGNOSIS — Z780 Asymptomatic menopausal state: Secondary | ICD-10-CM | POA: Diagnosis not present

## 2024-03-04 LAB — CBC WITH DIFFERENTIAL/PLATELET
Basophils Absolute: 0 10*3/uL (ref 0.0–0.2)
Basos: 1 %
EOS (ABSOLUTE): 0.1 10*3/uL (ref 0.0–0.4)
Eos: 3 %
Hematocrit: 38.4 % (ref 34.0–46.6)
Hemoglobin: 12.1 g/dL (ref 11.1–15.9)
Immature Grans (Abs): 0 10*3/uL (ref 0.0–0.1)
Immature Granulocytes: 0 %
Lymphocytes Absolute: 1.3 10*3/uL (ref 0.7–3.1)
Lymphs: 25 %
MCH: 25.9 pg — ABNORMAL LOW (ref 26.6–33.0)
MCHC: 31.5 g/dL (ref 31.5–35.7)
MCV: 82 fL (ref 79–97)
Monocytes Absolute: 0.5 10*3/uL (ref 0.1–0.9)
Monocytes: 10 %
Neutrophils Absolute: 3.2 10*3/uL (ref 1.4–7.0)
Neutrophils: 61 %
Platelets: 290 10*3/uL (ref 150–450)
RBC: 4.68 x10E6/uL (ref 3.77–5.28)
RDW: 13.7 % (ref 11.7–15.4)
WBC: 5.3 10*3/uL (ref 3.4–10.8)

## 2024-03-04 LAB — CMP14+EGFR
ALT: 7 IU/L (ref 0–32)
AST: 13 IU/L (ref 0–40)
Albumin: 4.1 g/dL (ref 3.8–4.8)
Alkaline Phosphatase: 149 IU/L — ABNORMAL HIGH (ref 44–121)
BUN/Creatinine Ratio: 20 (ref 12–28)
BUN: 30 mg/dL — ABNORMAL HIGH (ref 8–27)
Bilirubin Total: 0.7 mg/dL (ref 0.0–1.2)
CO2: 18 mmol/L — ABNORMAL LOW (ref 20–29)
Calcium: 9 mg/dL (ref 8.7–10.3)
Chloride: 102 mmol/L (ref 96–106)
Creatinine, Ser: 1.48 mg/dL — ABNORMAL HIGH (ref 0.57–1.00)
Globulin, Total: 3.3 g/dL (ref 1.5–4.5)
Glucose: 97 mg/dL (ref 70–99)
Potassium: 4.2 mmol/L (ref 3.5–5.2)
Sodium: 138 mmol/L (ref 134–144)
Total Protein: 7.4 g/dL (ref 6.0–8.5)
eGFR: 36 mL/min/{1.73_m2} — ABNORMAL LOW (ref >59)

## 2024-03-04 LAB — LIPID PANEL
Chol/HDL Ratio: 2.7 ratio (ref 0.0–4.4)
Cholesterol, Total: 152 mg/dL (ref 100–199)
HDL: 56 mg/dL (ref >39)
LDL Chol Calc (NIH): 83 mg/dL (ref 0–99)
Triglycerides: 63 mg/dL (ref 0–149)
VLDL Cholesterol Cal: 13 mg/dL (ref 5–40)

## 2024-03-07 ENCOUNTER — Encounter: Payer: Self-pay | Admitting: Family Medicine

## 2024-03-07 NOTE — Progress Notes (Signed)
Hello Caelyn,  Your lab result is normal and/or stable.Some minor variations that are not significant are commonly marked abnormal, but do not represent any medical problem for you.  Best regards, Ozias Dicenzo, M.D.

## 2024-03-07 NOTE — Progress Notes (Signed)
DEXA shows osteopenia. I recommend weekly fosamax. ?Nurse, if pt. Is agreeable, send in Fosamax 70 mg weekly, #13. ? ?Thanks, ?WS ?

## 2024-03-08 ENCOUNTER — Other Ambulatory Visit: Payer: Self-pay

## 2024-03-08 MED ORDER — ALENDRONATE SODIUM 70 MG PO TABS: 70.0000 mg | ORAL_TABLET | ORAL | 0 refills | Status: DC

## 2024-03-14 DIAGNOSIS — Z89529 Acquired absence of unspecified knee: Secondary | ICD-10-CM | POA: Diagnosis not present

## 2024-03-14 DIAGNOSIS — I509 Heart failure, unspecified: Secondary | ICD-10-CM | POA: Diagnosis not present

## 2024-03-15 ENCOUNTER — Other Ambulatory Visit: Payer: Self-pay | Admitting: Family Medicine

## 2024-04-05 DIAGNOSIS — E1142 Type 2 diabetes mellitus with diabetic polyneuropathy: Secondary | ICD-10-CM | POA: Diagnosis not present

## 2024-04-05 DIAGNOSIS — L84 Corns and callosities: Secondary | ICD-10-CM | POA: Diagnosis not present

## 2024-04-05 DIAGNOSIS — M79674 Pain in right toe(s): Secondary | ICD-10-CM | POA: Diagnosis not present

## 2024-04-05 DIAGNOSIS — M79675 Pain in left toe(s): Secondary | ICD-10-CM | POA: Diagnosis not present

## 2024-04-05 DIAGNOSIS — B351 Tinea unguium: Secondary | ICD-10-CM | POA: Diagnosis not present

## 2024-04-13 DIAGNOSIS — I509 Heart failure, unspecified: Secondary | ICD-10-CM | POA: Diagnosis not present

## 2024-04-13 DIAGNOSIS — Z89529 Acquired absence of unspecified knee: Secondary | ICD-10-CM | POA: Diagnosis not present

## 2024-05-14 DIAGNOSIS — Z89529 Acquired absence of unspecified knee: Secondary | ICD-10-CM | POA: Diagnosis not present

## 2024-05-14 DIAGNOSIS — I509 Heart failure, unspecified: Secondary | ICD-10-CM | POA: Diagnosis not present

## 2024-05-15 ENCOUNTER — Other Ambulatory Visit: Payer: Self-pay | Admitting: Family Medicine

## 2024-06-13 DIAGNOSIS — Z89529 Acquired absence of unspecified knee: Secondary | ICD-10-CM | POA: Diagnosis not present

## 2024-06-13 DIAGNOSIS — I509 Heart failure, unspecified: Secondary | ICD-10-CM | POA: Diagnosis not present

## 2024-07-14 DIAGNOSIS — I509 Heart failure, unspecified: Secondary | ICD-10-CM | POA: Diagnosis not present

## 2024-07-14 DIAGNOSIS — Z89529 Acquired absence of unspecified knee: Secondary | ICD-10-CM | POA: Diagnosis not present

## 2024-07-18 ENCOUNTER — Other Ambulatory Visit: Payer: Self-pay | Admitting: Family Medicine

## 2024-07-19 ENCOUNTER — Ambulatory Visit: Payer: Self-pay

## 2024-07-19 ENCOUNTER — Encounter: Payer: Self-pay | Admitting: Hematology and Oncology

## 2024-07-19 DIAGNOSIS — M79675 Pain in left toe(s): Secondary | ICD-10-CM | POA: Diagnosis not present

## 2024-07-19 DIAGNOSIS — E1142 Type 2 diabetes mellitus with diabetic polyneuropathy: Secondary | ICD-10-CM | POA: Diagnosis not present

## 2024-07-19 DIAGNOSIS — B351 Tinea unguium: Secondary | ICD-10-CM | POA: Diagnosis not present

## 2024-07-19 DIAGNOSIS — L84 Corns and callosities: Secondary | ICD-10-CM | POA: Diagnosis not present

## 2024-07-19 DIAGNOSIS — M79674 Pain in right toe(s): Secondary | ICD-10-CM | POA: Diagnosis not present

## 2024-07-19 NOTE — Telephone Encounter (Signed)
 FYI Only or Action Required?: FYI only for provider.  Patient was last seen in primary care on 03/03/2024 by Zollie Lowers, MD.  Called Nurse Triage reporting Nasal Congestion.  Symptoms began x 2 days.  Interventions attempted: OTC medications: Tylenol .  Symptoms are: unchanged.  Triage Disposition: See Physician Within 24 Hours  Patient/caregiver understands and will follow disposition?: Yes  **Appt. Scheduled for 8/13**        Copied from CRM #[id number]. Topic: Clinical - Red Word Triage >> Jul 19, 2024  2:06 PM Mia F wrote: Red Word that prompted transfer to Nurse Triage: Sinus pressure. Has been going on for 2 days. Pain is down her ears into her throat. Also has a sore throat. No fever. Feeling winded at times. Also feeling nauseous and having trouble sleeping. Reason for Disposition  Earache  Answer Assessment - Initial Assessment Questions 1. LOCATION: Where does it hurt?      Left side, down to eye, nasal area  2. ONSET: When did the sinus pain start?  (e.g., hours, days)      X 2 days  3. SEVERITY: How bad is the pain?   (Scale 0-10; or none, mild, moderate or severe)     Mild   4. RECURRENT SYMPTOM: Have you ever had sinus problems before? If Yes, ask: When was the last time? and What happened that time?      No  5. NASAL CONGESTION: Is the nose blocked? If Yes, ask: Can you open it or must you breathe through your mouth?      Must breathe out of mouth intermittently   6. NASAL DISCHARGE: Do you have discharge from your nose? If so ask, What color?     No  7. FEVER: Do you have a fever? If Yes, ask: What is it, how was it measured, and when did it start?      No  8. OTHER SYMPTOMS: Do you have any other symptoms? (e.g., sore throat, cough, earache, difficulty breathing)  Sore throat, earache left side.  For home care she has taken Tylenol .  Protocols used: Sinus Pain or Congestion-A-AH

## 2024-07-19 NOTE — Telephone Encounter (Signed)
 Apt scheduled.

## 2024-07-20 ENCOUNTER — Encounter: Payer: Self-pay | Admitting: Family Medicine

## 2024-07-20 ENCOUNTER — Ambulatory Visit (INDEPENDENT_AMBULATORY_CARE_PROVIDER_SITE_OTHER): Admitting: Family Medicine

## 2024-07-20 VITALS — BP 218/120 | HR 72 | Temp 97.9°F | Ht 65.0 in

## 2024-07-20 DIAGNOSIS — R0603 Acute respiratory distress: Secondary | ICD-10-CM | POA: Diagnosis not present

## 2024-07-20 DIAGNOSIS — I10 Essential (primary) hypertension: Secondary | ICD-10-CM

## 2024-07-20 DIAGNOSIS — E119 Type 2 diabetes mellitus without complications: Secondary | ICD-10-CM | POA: Diagnosis not present

## 2024-07-20 DIAGNOSIS — J019 Acute sinusitis, unspecified: Secondary | ICD-10-CM | POA: Diagnosis not present

## 2024-07-20 DIAGNOSIS — Z888 Allergy status to other drugs, medicaments and biological substances status: Secondary | ICD-10-CM | POA: Diagnosis not present

## 2024-07-20 DIAGNOSIS — R112 Nausea with vomiting, unspecified: Secondary | ICD-10-CM

## 2024-07-20 DIAGNOSIS — Z886 Allergy status to analgesic agent status: Secondary | ICD-10-CM | POA: Diagnosis not present

## 2024-07-20 DIAGNOSIS — J0100 Acute maxillary sinusitis, unspecified: Secondary | ICD-10-CM | POA: Diagnosis not present

## 2024-07-20 DIAGNOSIS — Z7982 Long term (current) use of aspirin: Secondary | ICD-10-CM | POA: Diagnosis not present

## 2024-07-20 DIAGNOSIS — Z7984 Long term (current) use of oral hypoglycemic drugs: Secondary | ICD-10-CM | POA: Diagnosis not present

## 2024-07-20 DIAGNOSIS — Z91013 Allergy to seafood: Secondary | ICD-10-CM | POA: Diagnosis not present

## 2024-07-20 DIAGNOSIS — Z881 Allergy status to other antibiotic agents status: Secondary | ICD-10-CM | POA: Diagnosis not present

## 2024-07-20 DIAGNOSIS — Z79899 Other long term (current) drug therapy: Secondary | ICD-10-CM | POA: Diagnosis not present

## 2024-07-20 DIAGNOSIS — J3489 Other specified disorders of nose and nasal sinuses: Secondary | ICD-10-CM | POA: Diagnosis not present

## 2024-07-20 MED ORDER — BETAMETHASONE SOD PHOS & ACET 6 (3-3) MG/ML IJ SUSP
6.0000 mg | Freq: Once | INTRAMUSCULAR | Status: AC
  Administered 2024-07-20 (×2): 6 mg via INTRAMUSCULAR

## 2024-07-20 NOTE — Progress Notes (Signed)
 Subjective:  Patient ID: Beth [name], female    DOB: 1946/11/20  Age: 78 y.o. MRN: [id number]  CC: Emesis, Dizziness, Sinus Problem, and Ear Pain   HPI  Discussed the use of AI scribe software for clinical note transcription with the patient, who gave verbal consent to proceed.  History of Present Illness   Beth [name] is an 78 year old female with hypertension who presents with elevated blood pressure and symptoms of a sinus infection.  She has been experiencing symptoms for a couple of days, including pain on the left side of her head, throat, and neck, along with dizziness that occurs while walking, necessitating frequent stops. She also has severe pain in her left ear and nasal congestion with a runny nose, but no productive cough.  She has been nauseous and vomited after taking her blood pressure medication, which may have affected its absorption. She has been taking her prescribed medications, including hydralazine  three times a day. However, due to vomiting, there is concern that the medication may not have been absorbed properly.  She has been using a nasal spray, Sinex, for her sinus symptoms, but has not been on it for long. She has also performed nasal rinses, which have helped clear some phlegm. She experiences tenderness over her sinuses, particularly in her forehead, with pain radiating into her cheeks and neck.  No chest pain or abdominal pain despite nausea.             07/20/2024    4:22 PM 03/03/2024    3:55 PM 12/30/2023    3:09 PM  Depression screen PHQ 2/9  Decreased Interest 2 0 0  Down, Depressed, Hopeless 0 0 0  PHQ - 2 Score 2 0 0  Altered sleeping 2 1   Tired, decreased energy 0 0   Change in appetite 2 0   Feeling bad or failure about yourself  0 0   Trouble concentrating 0 0   Moving slowly or fidgety/restless 3 0   Suicidal thoughts 0 0   PHQ-9 Score 9 1   Difficult doing work/chores Extremely dIfficult Not difficult at all      History Beth [name] has a past medical history of Anemia, Arthritis, Blood transfusion without reported diagnosis (2012), Cataract, CKD (chronic kidney disease), stage III (HCC), Diabetes mellitus without complication (HCC), Family history of anesthesia complication, Gout, Herpes infection (08/09/2014), Hyperlipidemia, Hypertension, 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   She has a past surgical history that includes Carpal tunnel release (Right, 1983); colonscopy (June 21, 2014); nasal cauterization (2012); Total knee arthroplasty (Right, 07/03/2014); Patellar tendon repair (Right, 08/11/2014); Joint replacement (06/2014); I & D knee with poly exchange (Right, 10/02/2014); Excisional total knee arthroplasty with antibiotic spacers (Right, 02/16/2015); Reimplantation of total knee (Right, 05/23/2015); Abdominal hysterectomy (1983); Tubal ligation; Incision and drainage of wound (Right, 01/14/2017); Excisional total knee arthroplasty with antibiotic spacers (Right, 11/03/2018); and I & D knee with poly exchange (Right, 08/27/2020).   Her family history includes Cancer in her mother; Diabetes in her son and son; Heart disease (age of onset: 39) in her daughter; Heart disease (age of onset: 77) in her brother; Hypertension in her father; Kidney disease in her daughter; Ovarian cancer in her mother; Peripheral vascular  disease in her father.She reports that she has never smoked. She has never used smokeless tobacco. She reports that she does not drink alcohol and does not use drugs.    ROS Review of Systems  Constitutional:  Negative for activity  change, appetite change, chills and fever.  HENT:  Positive for congestion, postnasal drip, rhinorrhea and sinus pressure. Negative for ear discharge, ear pain, hearing loss, nosebleeds, sneezing and trouble swallowing.   Respiratory:  Negative for chest tightness and shortness of breath.   Cardiovascular:  Negative for chest pain and palpitations.  Skin:  Negative for rash.  Neurological:  Positive for headaches (frontal).    Objective:  BP (!) 218/120   Pulse 72   Temp 97.9 F (36.6 C)   Ht 5' 5 (1.651 m)   SpO2 96%   BMI 37.94 kg/m   BP Readings from Last 3 Encounters:  07/20/24 (!) 218/120  03/03/24 (!) 151/84  02/17/24 (!) 140/72    Wt Readings from Last 3 Encounters:  12/30/23 228 lb (103.4 kg)  10/14/23 228 lb (103.4 kg)  12/18/22 220 lb (99.8 kg)     Physical Exam  Physical Exam   HEENT: Pupils equal, round. Extraocular movements intact. Throat with drainage, likely from sinus infection. Nasal passages with swelling. Sinus tenderness present. Ear inflammation present. CHEST: Lungs clear to auscultation. CARDIOVASCULAR: Irregular rhythm, murmur present. ABDOMEN: Abdomen soft, non-tender.      Assessment & Plan:  Acute sinusitis, recurrence not specified, unspecified location -     Betamethasone  Sod Phos & Acet  Accelerated hypertension  Nausea and vomiting, unspecified vomiting type    Assessment and Plan    Acute sinusitis with associated nausea, vomiting, and dizziness Acute sinusitis with left-sided head, throat, and neck pain, dizziness, and nausea. Sinex may elevate blood pressure. - Administer cortisone shot for sinusitis. - Advise against decongestants due to hypertension. - Continue saline nasal rinse.  Hypertension Hypertension with dangerously high blood pressure, possibly exacerbated by acute illness and Sinex use. Vomiting may have affected medication absorption. - Administer 0.1 mg clonidine  tablet. - Recheck blood pressure after 10  minutes. - Advise repeating blood pressure medication if vomiting occurs within an hour of ingestion. - Ensure adherence to hydralazine  regimen (three times daily). - Monitor blood pressure at home.  Cardiac murmur Chronic cardiac murmur with irregular heart rhythm and occasional skipped beats.          Follow-up: Pt. Sent to E.D. declines ambulance, Daughter will take her.  Butler Der, M.D.

## 2024-07-22 ENCOUNTER — Ambulatory Visit: Payer: Self-pay

## 2024-07-22 ENCOUNTER — Telehealth (INDEPENDENT_AMBULATORY_CARE_PROVIDER_SITE_OTHER): Admitting: Family Medicine

## 2024-07-22 NOTE — Telephone Encounter (Signed)
 FYI Only or Action Required?: FYI only for provider.  Patient was last seen in primary care on 07/22/2024 by Dettinger, Fonda LABOR, MD.  Called Nurse Triage reporting Ear Pain.  Symptoms began several days ago.  Interventions attempted: OTC medications: Tylenol , warm washcloth, nasal saline flush.  Symptoms are: gradually worsening.  Triage Disposition: See Physician Within 24 Hours  Patient/caregiver understands and will follow disposition?: Yes   Copied from CRM #[id number]. Topic: Clinical - Red Word Triage >> Jul 22, 2024  4:37 PM Jasmin G wrote: Red Word that prompted transfer to Nurse Triage: Pt had to go to the emergency room today to experiencing really high blood pressure at 231, she's also dealing with an ear infection with fluid in both ears. Pt states she was at [facility]. Reason for Disposition  Earache  (Exceptions: Brief ear pain of lasting less than 60 minutes, or earache occurring during air travel.)  Answer Assessment - Initial Assessment Questions Pt states she has ear pain and ear feels full and clogged. Patient states provider saw some wax in her ear on 07/20/2024. Pt took Tylenol  for symptoms with no relief, cold washcloth and used nasal saline flush.  Patient denied neck pain/ stiffness and denies ear drainage. Pt advised to go to nearest urgent care this evening due to inability to make an appointment on Monday in office. Patient states her daughter will take her to the nearest urgent care.   1. LOCATION: Which ear is involved?     L ear  2. ONSET: When did the ear pain start?      07/20/24 3. SEVERITY: How bad is the pain?  (Scale 1-10; mild, moderate or severe)     8-9/10 4. URI SYMPTOMS: Do you have a runny nose or cough?     No 5. FEVER: Do you have a fever? If Yes, ask: What is your temperature, how was it measured, and when did it start?     No 6. CAUSE: Have you been swimming recently?, How often do you use Q-TIPS?, Have you had any  recent air travel or scuba diving?     No 7. OTHER SYMPTOMS: Do you have any other symptoms? (e.g., decreased hearing, dizziness, headache, stiff neck, vomiting)     Hard to swallow due to  L side throat pain  Protocols used: Earache-A-AH

## 2024-07-22 NOTE — Telephone Encounter (Signed)
 Appt made for after hours 07-22-2024

## 2024-07-22 NOTE — Telephone Encounter (Signed)
 FYI Only or Action Required?: Action required by provider: clinical question for provider.  Patient was last seen in primary care on 07/20/2024 by Zollie Lowers, MD.  Called Nurse Triage reporting Otalgia.  Symptoms began 07/20/2024.  Interventions attempted: OTC medications: tylenol  and Rest, hydration, or home remedies.  Symptoms are: unchanged.  Triage Disposition: See HCP Within 4 Hours (Or PCP Triage)  Patient/caregiver understands and will follow disposition?: No, wishes to speak with PCP  Copied from CRM #[id number]. Topic: Clinical - Red Word Triage >> Jul 22, 2024  3:18 PM Nathanel BROCKS wrote: Red Word that prompted transfer to Nurse Triage:   Ear infection and it is very painful (fluid on ear). Needs medicine. Reason for Disposition  [1] SEVERE pain (e.g., excruciating) and [2] not improved 2 hours after pain medicine (e.g., acetaminophen  or ibuprofen)  Answer Assessment - Initial Assessment Questions 1. LOCATION: Which ear is involved?     Left ear is quite painful but patient believes she has fluid in both ears.  2. ONSET: When did the ear pain start?      8/13-patient was in the office and then had to go to the ED due to increased blood pressure readings.  3. SEVERITY: How bad is the pain?  (Scale 1-10; mild, moderate or severe)     severe 4. URI SYMPTOMS: Do you have a runny nose or cough?     no 5. FEVER: Do you have a fever? If Yes, ask: What is your temperature, how was it measured, and when did it start?     no 6. CAUSE: Have you been swimming recently?, How often do you use Q-TIPS?, Have you had any recent air travel or scuba diving?     no 7. OTHER SYMPTOMS: Do you have any other symptoms? (e.g., decreased hearing, dizziness, headache, stiff neck, vomiting)     Did vomit but no longer.   Patient is asking for medication for a probable ear infection. Patient feels that the pain to her ear has gotten worse. Patient is wanting her provider to send  medication in. Explained to patient that there is no guarantee that the provider will be able to address her issues today. Recommended patient be evaluated at Urgent Care. Patient states she doesn't have any transportation today. Recommended that if patient doesn't hear anything from the office today that urgent care tomorrow would be her best option.  Protocols used: Rilla

## 2024-07-22 NOTE — Progress Notes (Signed)
 Attempted to 3 times and attempted to connect on the video, no answer Beth Levins, MD [facility] 07/22/2024, 4:56 PM

## 2024-07-24 DIAGNOSIS — J029 Acute pharyngitis, unspecified: Secondary | ICD-10-CM | POA: Diagnosis not present

## 2024-07-24 DIAGNOSIS — J3489 Other specified disorders of nose and nasal sinuses: Secondary | ICD-10-CM | POA: Diagnosis not present

## 2024-07-24 DIAGNOSIS — N182 Chronic kidney disease, stage 2 (mild): Secondary | ICD-10-CM | POA: Diagnosis not present

## 2024-07-24 DIAGNOSIS — Z79899 Other long term (current) drug therapy: Secondary | ICD-10-CM | POA: Diagnosis not present

## 2024-07-24 DIAGNOSIS — R918 Other nonspecific abnormal finding of lung field: Secondary | ICD-10-CM | POA: Diagnosis not present

## 2024-07-24 DIAGNOSIS — J329 Chronic sinusitis, unspecified: Secondary | ICD-10-CM | POA: Diagnosis not present

## 2024-07-24 DIAGNOSIS — M47812 Spondylosis without myelopathy or radiculopathy, cervical region: Secondary | ICD-10-CM | POA: Diagnosis not present

## 2024-07-24 DIAGNOSIS — Z881 Allergy status to other antibiotic agents status: Secondary | ICD-10-CM | POA: Diagnosis not present

## 2024-07-24 DIAGNOSIS — M542 Cervicalgia: Secondary | ICD-10-CM | POA: Diagnosis not present

## 2024-07-24 DIAGNOSIS — Z882 Allergy status to sulfonamides status: Secondary | ICD-10-CM | POA: Diagnosis not present

## 2024-07-24 DIAGNOSIS — E1122 Type 2 diabetes mellitus with diabetic chronic kidney disease: Secondary | ICD-10-CM | POA: Diagnosis not present

## 2024-07-24 DIAGNOSIS — I10 Essential (primary) hypertension: Secondary | ICD-10-CM | POA: Diagnosis not present

## 2024-07-24 DIAGNOSIS — H9202 Otalgia, left ear: Secondary | ICD-10-CM | POA: Diagnosis not present

## 2024-07-24 DIAGNOSIS — Z7982 Long term (current) use of aspirin: Secondary | ICD-10-CM | POA: Diagnosis not present

## 2024-07-24 DIAGNOSIS — I129 Hypertensive chronic kidney disease with stage 1 through stage 4 chronic kidney disease, or unspecified chronic kidney disease: Secondary | ICD-10-CM | POA: Diagnosis not present

## 2024-07-24 DIAGNOSIS — N189 Chronic kidney disease, unspecified: Secondary | ICD-10-CM | POA: Diagnosis not present

## 2024-07-24 DIAGNOSIS — Z7984 Long term (current) use of oral hypoglycemic drugs: Secondary | ICD-10-CM | POA: Diagnosis not present

## 2024-07-25 ENCOUNTER — Telehealth: Payer: Self-pay | Admitting: Family Medicine

## 2024-07-25 ENCOUNTER — Encounter: Payer: Self-pay | Admitting: Nurse Practitioner

## 2024-07-25 ENCOUNTER — Ambulatory Visit (INDEPENDENT_AMBULATORY_CARE_PROVIDER_SITE_OTHER): Admitting: Nurse Practitioner

## 2024-07-25 VITALS — BP 131/67 | HR 60 | Temp 97.1°F | Ht 65.0 in | Wt 231.0 lb

## 2024-07-25 DIAGNOSIS — M542 Cervicalgia: Secondary | ICD-10-CM | POA: Diagnosis not present

## 2024-07-25 DIAGNOSIS — N182 Chronic kidney disease, stage 2 (mild): Secondary | ICD-10-CM | POA: Insufficient documentation

## 2024-07-25 DIAGNOSIS — N184 Chronic kidney disease, stage 4 (severe): Secondary | ICD-10-CM | POA: Insufficient documentation

## 2024-07-25 DIAGNOSIS — Z09 Encounter for follow-up examination after completed treatment for conditions other than malignant neoplasm: Secondary | ICD-10-CM

## 2024-07-25 DIAGNOSIS — R112 Nausea with vomiting, unspecified: Secondary | ICD-10-CM | POA: Diagnosis not present

## 2024-07-25 DIAGNOSIS — H66002 Acute suppurative otitis media without spontaneous rupture of ear drum, left ear: Secondary | ICD-10-CM | POA: Diagnosis not present

## 2024-07-25 DIAGNOSIS — I10 Essential (primary) hypertension: Secondary | ICD-10-CM

## 2024-07-25 MED ORDER — NEOMYCIN-POLYMYXIN-HC 3.5-10000-1 OT SOLN
3.0000 [drp] | Freq: Three times a day (TID) | OTIC | 0 refills | Status: AC
Start: 2024-07-25 — End: 2024-07-28

## 2024-07-25 MED ORDER — ONDANSETRON HCL 4 MG PO TABS: 4.0000 mg | ORAL_TABLET | Freq: Three times a day (TID) | ORAL | 0 refills | Status: DC | PRN

## 2024-07-25 NOTE — Telephone Encounter (Signed)
 Noted

## 2024-07-25 NOTE — Progress Notes (Unsigned)
 Established Patient Office Visit  Subjective  Patient ID: Beth [name], female    DOB: 09/20/1946  Age: 78 y.o. MRN: [id number]  Chief Complaint  Patient presents with   Follow-up    ED FU, HTN, ear pain, ST, stuff nose, SOB, went Fri & Sun    HPI Beth [name] is an 78 year old female presenting with her daughter on July 25, 2024, for a hospital discharge follow-up. She was seen in the ED on 07/24/2024 for neck pain. At discharge, her antihypertensive regimen was adjusted: Hydralazine  increased from 50 mg TID to 100 mg TID, and Losartan was added.  The patient currently reports nasal congestion and ear pain, without associated fever, chills, chest pain, shortness of breath, palpitations, or other systemic symptoms. She denies recent trauma, dizziness, or weakness. Her daughter expresses concern regarding the recent increase in blood pressure medications in light of the patient's reduced renal function (eGFR 17). Labs from the ED on 07/24/2024 included a BMP, confirming decreased renal function.  The patient's pain is localized to the neck and ears, and she reports no radiation or associated neurological symptoms. She is otherwise alert, oriented, and able to perform activities of daily living with assistance from her daughter.  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 12/31/2023   CHF (congestive heart failure) (HCC) 08/20/2023   Murmur 09/24/2021   Hamstring tendinitis of right thigh 08/22/2019   Osteoarthritis of both sacroiliac joints (HCC) 08/22/2019   Diabetes type 2, controlled (HCC)  03/07/2016   Gout    Essential hypertension    Enuresis 11/19/2015   HLD (hyperlipidemia) 01/04/2014   Anemia, iron  deficiency 12/30/2011   Constipation 04/04/2009   TUBULOVILLOUS ADENOMA, COLON 04/03/2009   Past Medical History:  Diagnosis Date   Anemia    Arthritis    Knee both knees   Blood transfusion without reported diagnosis 2012   anemia;pt denies transfusion stated was only on iron  tablet   Cataract    left   CKD (chronic kidney disease), stage III (HCC)    Diabetes mellitus without complication (HCC)    Family history of anesthesia complication    sister very slow to awaken after anesthesia;severe vomiting    Gout    left elbow   Herpes infection 08/09/2014   Saw doctor Wed. 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   Other acute osteomyelitis, right femur (HCC) 07/23/2020   Pseudomonas aeruginosa infection 12/15/2018   Pseudomonas aeruginosa infection 12/15/2018   Septic arthritis of knee, right (HCC) 08/27/2020   Stroke (HCC) 2006   x 1 no deficits noted    Past Surgical History:  Procedure Laterality Date   ABDOMINAL HYSTERECTOMY  1983   CARPAL TUNNEL RELEASE Right 1983   colonscopy  June 21, 2014   EXCISIONAL TOTAL KNEE ARTHROPLASTY WITH ANTIBIOTIC SPACERS Right 02/16/2015   Procedure: RIGHT KNEE RESECTION ARTHROPLASTY WITH ANTIBIOTIC SPACERS;  Surgeon: Dempsey Moan, MD;  Location: [facility];  Service: Orthopedics;  Laterality: Right;   EXCISIONAL TOTAL KNEE ARTHROPLASTY WITH ANTIBIOTIC SPACERS Right 11/03/2018   Procedure: Right knee resection arthroplasty; antibiotic spacer;  Surgeon: Moan Dempsey, MD;  Location: [facility];  Service: Orthopedics;  Laterality: Right;  Adductor Block   I & D KNEE WITH POLY EXCHANGE Right 10/02/2014   Procedure:  IRRIGATION AND DEBRIDEMENT RIGHT KNEE WITH POLY EXCHANGE;  Surgeon: Dempsey Moan GAILS, MD;  Location: [facility];  Service: Orthopedics;  Laterality: Right;   I & D KNEE WITH POLY EXCHANGE Right 08/27/2020   Procedure: IRRIGATION AND DEBRIDEMENT; SPACER EXCHANGE RIGHT KNEE with multiple specimens;  Surgeon: Moan Dempsey, MD;  Location: [facility];  Service: Orthopedics;  Laterality: Right;    INCISION AND DRAINAGE OF WOUND Right 01/14/2017   Procedure: IRRIGATION AND DEBRIDEMENT WOUND;  Surgeon: Dempsey Moan, MD;  Location: [facility];  Service: Orthopedics;  Laterality: Right;  requests   JOINT REPLACEMENT  06/2014   right knee   nasal cauterization  2012   PATELLAR TENDON REPAIR Right 08/11/2014   Procedure: RIGHT PATELLA TENDON REPAIR;  Surgeon: Dempsey Moan GAILS, MD;  Location: [facility];  Service: Orthopedics;  Laterality: Right;   REIMPLANTATION OF TOTAL KNEE Right 05/23/2015   Procedure: RIGHT KNEE ARTHROPLASTY REIMPLANTATION;  Surgeon: Dempsey Moan, MD;  Location: [facility];  Service: Orthopedics;  Laterality: Right;   TOTAL KNEE ARTHROPLASTY Right 07/03/2014   Procedure: RIGHT TOTAL KNEE ARTHROPLASTY;  Surgeon: Dempsey Moan GAILS, MD;  Location: [facility];  Service: Orthopedics;  Laterality: Right;   TUBAL LIGATION     Social History   Tobacco Use   Smoking status: Never   Smokeless tobacco: Never  Vaping Use   Vaping status: Never Used  Substance Use Topics   Alcohol use: No   Drug use: No   Social History   Socioeconomic History   Marital status: Widowed    Spouse name: Not on file   Number of children: 9   Years of education: 8th   Highest education level: 8th grade  Occupational History    Employer: RETIRED  Tobacco Use   Smoking status: Never   Smokeless tobacco: Never  Vaping Use   Vaping status: Never Used  Substance and Sexual Activity   Alcohol use: No   Drug use: No   Sexual activity: Not Currently  Other Topics Concern   Not on file  Social History Narrative   She  has 6 living adult children and 3 who have passed away and many grandchildren and great grandchildren. Patient is retired. She worked part time Education officer, environmental houses and a Research officer, trade union.    One child in [place], others in [place], [place], [place], and [place]   Social Drivers of Health   Financial Resource Strain: Low Risk  (07/20/2024)   Received from [facility]   Overall Financial Resource Strain (CARDIA)    How hard is it for you to pay for the very basics like food, housing, medical care, and heating?: Not hard at all  Food Insecurity: No Food Insecurity (07/20/2024)   Received from [facility]   Hunger Vital Sign    Within the past 12 months, you worried that your food would run out before you got the money to buy more.: Never true    Within the past 12 months, the food you bought just didn't last and you didn't have money to get more.: Never true  Transportation Needs: Unmet  Transportation Needs (07/20/2024)   Received from [facility] - Transportation    Lack of Transportation (Medical): Yes    Lack of Transportation (Non-Medical): Yes  Physical Activity: Inactive (07/20/2024)   Received from [facility]   Exercise Vital Sign    On average, how many days per week do you engage in moderate to strenuous exercise (like a brisk walk)?: 0 days    On average, how many minutes do you engage in exercise at this level?: 0 min  Stress: No Stress Concern Present (07/20/2024)   Received from [facility] - Occupational Stress Questionnaire    Do you feel stress - tense, restless, nervous, or anxious, or unable to sleep at night because your mind is troubled all the time - these days?: Not at all  Social Connections: Socially Isolated (07/20/2024)   Received from [facility]   Social Connection and Isolation Panel    In a typical week, how many times do you talk on the phone with family, friends, or neighbors?: More than three times a  week    How often do you get together with friends or relatives?: Once a week    How often do you attend church or religious services?: Never    Do you belong to any clubs or organizations such as church groups, unions, fraternal or athletic groups, or school groups?: No    How often do you attend meetings of the clubs or organizations you belong to?: Never    Are you married, widowed, divorced, separated, never married, or living with a partner?: Never married  Intimate Partner Violence: Not At Risk (07/20/2024)   Received from [facility]   Humiliation, Afraid, Rape, and Kick questionnaire    Within the last year, have you been afraid of your partner or ex-partner?: No    Within the last year, have you been humiliated or emotionally abused in other ways by your partner or ex-partner?: No    Within the last year, have you been kicked, hit, slapped, or otherwise physically hurt by your partner or ex-partner?: No    Within the last year, have you been raped or forced to have any kind of sexual activity by your partner or ex-partner?: No   Family Status  Relation Name Status   Mother  Deceased at age 55   Father  Deceased at age 80   Sister  Alive   Sister  Alive   Sister  Alive   Brother Consulting civil engineer Alive   Brother  Alive   Daughter  Alive   Daughter  Deceased   Son [place] Deceased   Son [place] Alive   Son [place]son[place] Deceased   Son Gene Roca Alive   Son  Alive   Son  Alive   Son  Alive   Neg Hx  (Not Specified)  No partnership data on file   Family History  Problem Relation Age of Onset   Ovarian cancer Mother    Cancer Mother    Peripheral vascular disease Father        with amputation of both legs   Hypertension Father    Heart disease Brother 30   Kidney disease Daughter    Heart disease Daughter 84   Diabetes Son    Diabetes Son    Colon cancer Neg Hx    Esophageal cancer Neg Hx    Stomach cancer Neg Hx  Rectal cancer Neg Hx    Allergies  Allergen Reactions    Cefdinir Swelling and Rash   Aleve [Naproxen Sodium] Other (See Comments)    Heart races   Asa [Aspirin ] Other (See Comments)    Nose bleeding   Ciprofloxacin  Other (See Comments)    Possible hamstring tendinopathy   Clonidine  Derivatives Other (See Comments)    Dizziness and weakness   Shellfish Allergy Nausea And Vomiting      Review of Systems  Constitutional:  Negative for chills and fever.  HENT:  Positive for congestion, ear pain and sinus pain.   Respiratory:  Negative for cough and wheezing.   Cardiovascular:  Negative for chest pain and leg swelling.  Gastrointestinal:  Positive for nausea and vomiting.  Neurological:  Negative for dizziness and headaches.   Negative unless indicated in HPI   Objective:     BP 131/67   Pulse 60   Temp (!) 97.1 F (36.2 C) (Oral)   Ht 5' 5 (1.651 m)   Wt 231 lb (104.8 kg)   SpO2 92%   BMI 38.44 kg/m  BP Readings from Last 3 Encounters:  07/25/24 131/67  07/20/24 (!) 218/120  03/03/24 (!) 151/84   Wt Readings from Last 3 Encounters:  07/25/24 231 lb (104.8 kg)  12/30/23 228 lb (103.4 kg)  10/14/23 228 lb (103.4 kg)      Physical Exam Vitals and nursing note reviewed.  Constitutional:      Appearance: She is obese.  HENT:     Head: Normocephalic and atraumatic.     Right Ear: Hearing, tympanic membrane, ear canal and external ear normal.     Left Ear: Swelling and tenderness present. Tympanic membrane is bulging.     Nose: Congestion present.  Eyes:     General:        Left eye: No discharge.     Extraocular Movements: Extraocular movements intact.     Conjunctiva/sclera: Conjunctivae normal.     Pupils: Pupils are equal, round, and reactive to light.  Cardiovascular:     Heart sounds: Normal heart sounds.  Pulmonary:     Effort: Pulmonary effort is normal.     Breath sounds: Normal breath sounds.  Abdominal:     General: Bowel sounds are normal.     Palpations: Abdomen is soft.  Musculoskeletal:         General: Normal range of motion.     Right lower leg: No edema.     Left lower leg: No edema.  Skin:    General: Skin is warm and dry.     Findings: No rash.  Neurological:     Mental Status: She is alert and oriented to person, place, and time.     Comments: Use a wheelchair for ambulation  Psychiatric:        Mood and Affect: Mood normal.        Behavior: Behavior normal.        Thought Content: Thought content normal.        Judgment: Judgment normal.      No results found for any visits on 07/25/24.  Last CBC Lab Results  Component Value Date   WBC 5.3 03/03/2024   HGB 12.1 03/03/2024   HCT 38.4 03/03/2024   MCV 82 03/03/2024   MCH 25.9 (L) 03/03/2024   RDW 13.7 03/03/2024   PLT 290 03/03/2024   Last metabolic panel Lab Results  Component Value Date   GLUCOSE 97 03/03/2024  NA 138 03/03/2024   K 4.2 03/03/2024   CL 102 03/03/2024   CO2 18 (L) 03/03/2024   BUN 30 (H) 03/03/2024   CREATININE 1.48 (H) 03/03/2024   EGFR 36 (L) 03/03/2024   CALCIUM  9.0 03/03/2024   PROT 7.4 03/03/2024   ALBUMIN 4.1 03/03/2024   LABGLOB 3.3 03/03/2024   AGRATIO 1.4 12/18/2022   BILITOT 0.7 03/03/2024   ALKPHOS 149 (H) 03/03/2024   AST 13 03/03/2024   ALT 7 03/03/2024   ANIONGAP 8 08/29/2020   Last lipids Lab Results  Component Value Date   CHOL 152 03/03/2024   HDL 56 03/03/2024   LDLCALC 83 03/03/2024   TRIG 63 03/03/2024   CHOLHDL 2.7 03/03/2024   Last hemoglobin A1c Lab Results  Component Value Date   HGBA1C 5.8 (H) 03/03/2024   Last thyroid  functions Lab Results  Component Value Date   TSH 0.881   methodology is 3rd generation TSH 06/07/2009   T4TOTAL 8.9 06/07/2009        Assessment & Plan:  Neck pain, musculoskeletal  Uncontrolled hypertension -     Ambulatory referral to Nephrology  Hospital discharge follow-up  Stage 4 chronic kidney disease (HCC) -     Ambulatory referral to Nephrology  Acute suppurative otitis media of left ear without  spontaneous rupture of tympanic membrane, recurrence not specified  Nausea and vomiting, unspecified vomiting type -     Ondansetron  HCl; Take 1 tablet (4 mg total) by mouth every 8 (eight) hours as needed for nausea or vomiting.  Dispense: 20 tablet; Refill: 0  Other orders -     Neomycin -Polymyxin-HC; Place 3 drops into the left ear 3 (three) times daily for 3 days.  Dispense: 10 mL; Refill: 0 -     Azelastine  HCl; Place 1 spray into both nostrils 2 (two) times daily. Use in each nostril as directed  Dispense: 30 mL; Refill: 12   Beth [name] is an 78 year old African-American female seen today for hospital discharge follow-up, no acute distress Hypertension: Continue hydralazine  100 mg 3 times daily, and losartan  CKD stage IV: Referral to nephrology Otitis media: Neomycin  3 times daily left ear Congestion: Astelin  3 times daily Goal BP:  For patients younger than 60: Goal BP < 140/90. For patients 60 and older: Goal BP < 150/90. For patients with diabetes: Goal BP < 140/90.  Take your medications faithfully as prescribed. Maintain a healthy weight. Get at least 150 minutes of aerobic exercise per week. Minimize salt intake, less than 2000 mg per day. Minimize alcohol intake.  DASH Eating Plan DASH stands for Dietary Approaches to Stop Hypertension. The DASH eating plan is a healthy eating plan that has been shown to reduce high blood pressure (hypertension). Additional health benefits may include reducing the risk of type 2 diabetes mellitus, heart disease, and stroke. The DASH eating plan may also help with weight loss.  WHAT DO I NEED TO KNOW ABOUT THE DASH EATING PLAN? For the DASH eating plan, you will follow these general guidelines: Choose foods with a percent daily value for sodium of less than 5% (as listed on the food label). Use salt-free seasonings or herbs instead of table salt or sea salt. Check with your health care provider or pharmacist before using salt  substitutes. Eat lower-sodium products, often labeled as lower sodium or no salt added. Eat fresh foods. Eat more vegetables, fruits, and low-fat dairy products. Choose whole grains. Look for the word whole as the first word in the ingredient  list. Choose fish and skinless chicken or [place] more often than red meat. Limit fish, poultry, and meat to 6 oz (170 g) each day. Limit sweets, desserts, sugars, and sugary drinks. Choose heart-healthy fats. Limit cheese to 1 oz (28 g) per day. Eat more home-cooked food and less restaurant, buffet, and fast food. Limit fried foods. Cook foods using methods other than frying. Limit canned vegetables. If you do use them, rinse them well to decrease the sodium. When eating at a restaurant, ask that your food be prepared with less salt, or no salt if possible.  WHAT FOODS CAN I EAT? Seek help from a dietitian for individual calorie needs.  Grains Whole grain or whole wheat bread. Brown rice. Whole grain or whole wheat pasta. Quinoa, bulgur, and whole grain cereals. Low-sodium cereals. Corn or whole wheat flour tortillas. Whole grain cornbread. Whole grain crackers. Low-sodium crackers.  Vegetables Fresh or frozen vegetables (raw, steamed, roasted, or grilled). Low-sodium or reduced-sodium tomato and vegetable juices. Low-sodium or reduced-sodium tomato sauce and paste. Low-sodium or reduced-sodium canned vegetables.   Fruits All fresh, canned (in natural juice), or frozen fruits.  Meat and Other Protein Products Ground beef (85% or leaner), grass-fed beef, or beef trimmed of fat. Skinless chicken or [place]. Ground chicken or [place]. Pork trimmed of fat. All fish and seafood. Eggs. Dried beans, peas, or lentils. Unsalted nuts and seeds. Unsalted canned beans.  Dairy Low-fat dairy products, such as skim or 1% milk, 2% or reduced-fat cheeses, low-fat ricotta or cottage cheese, or plain low-fat yogurt. Low-sodium or reduced-sodium cheeses.  Fats  and Oils Tub margarines without trans fats. Light or reduced-fat mayonnaise and salad dressings (reduced sodium). Avocado. Safflower, olive, or canola oils. Natural peanut or almond butter.  Other Unsalted popcorn and pretzels. The items listed above may not be a complete list of recommended foods or beverages. Contact your dietitian for more options.  WHAT FOODS ARE NOT RECOMMENDED?  Grains White bread. White pasta. White rice. Refined cornbread. Bagels and croissants. Crackers that contain trans fat.  Vegetables Creamed or fried vegetables. Vegetables in a cheese sauce. Regular canned vegetables. Regular canned tomato sauce and paste. Regular tomato and vegetable juices.  Fruits Dried fruits. Canned fruit in light or heavy syrup. Fruit juice.  Meat and Other Protein Products Fatty cuts of meat. Ribs, chicken wings, bacon, sausage, bologna, salami, chitterlings, fatback, hot dogs, bratwurst, and packaged luncheon meats. Salted nuts and seeds. Canned beans with salt.  Dairy Whole or 2% milk, cream, half-and-half, and cream cheese. Whole-fat or sweetened yogurt. Full-fat cheeses or blue cheese. Nondairy creamers and whipped toppings. Processed cheese, cheese spreads, or cheese curds.  Condiments Onion and garlic salt, seasoned salt, table salt, and sea salt. Canned and packaged gravies. Worcestershire sauce. Tartar sauce. Barbecue sauce. Teriyaki sauce. Soy sauce, including reduced sodium. Steak sauce. Fish sauce. Oyster sauce. Cocktail sauce. Horseradish. Ketchup and mustard. Meat flavorings and tenderizers. Bouillon cubes. Hot sauce. Tabasco sauce. Marinades. Taco seasonings. Relishes.  Fats and Oils Butter, stick margarine, lard, shortening, ghee, and bacon fat. Coconut, palm kernel, or palm oils. Regular salad dressings.  Other Pickles and olives. Salted popcorn and pretzels.  The items listed above may not be a complete list of foods and beverages to avoid. Contact your dietitian  for more information.  WHERE CAN I FIND MORE INFORMATION? National Heart, Lung, and Blood Institute: [employer] Document Released: 11/13/2011 Document Revised: 04/10/2014 Document Reviewed: 09/28/2013 [facility] Patient Information 2015 [place], [place]. This information is not intended to  replace advice given to you by your health care provider. Make sure you discuss any questions you have with your health care provider.  I think that you would greatly benefit from seeing a nutritionist.  If you are interested,      The above assessment and management plan was discussed with the patient. The patient verbalized understanding of and has agreed to the management plan. Patient is aware to call the clinic if they develop any new symptoms or if symptoms persist or worsen. Patient is aware when to return to the clinic for a follow-up visit. Patient educated on when it is appropriate to go to the emergency department.  Return for follow-up as already scheduled with PCP.    Niki Payment [place] Thompson, DNP [facility] [address] [place], [place] [address] [phone]    Note: This document was prepared by [employer] voice dictation technology and any errors that results from this process are unintentional.

## 2024-07-25 NOTE — Telephone Encounter (Signed)
 okay

## 2024-07-25 NOTE — Telephone Encounter (Signed)
 Patient wants to start seeing Beth [name].

## 2024-07-26 MED ORDER — AZELASTINE HCL 0.1 % NA SOLN: 1.0000 | Freq: Two times a day (BID) | NASAL | 12 refills | Status: DC

## 2024-07-27 ENCOUNTER — Other Ambulatory Visit: Payer: Self-pay | Admitting: Nurse Practitioner

## 2024-07-27 DIAGNOSIS — H66002 Acute suppurative otitis media without spontaneous rupture of ear drum, left ear: Secondary | ICD-10-CM

## 2024-07-27 MED ORDER — AZITHROMYCIN 250 MG PO TABS: ORAL_TABLET | ORAL | 0 refills | Status: DC

## 2024-07-27 NOTE — Progress Notes (Signed)
 Beth [name]'s daughter called stating that otic antibiotic is not improving her mother's condition. offered p.o. antibiotics she agree azithromycin  [phone] sent to her pharmacy

## 2024-07-28 NOTE — Telephone Encounter (Signed)
 I called pt to let her know that she will have to continue to see Dr Zollie here at [facility].

## 2024-07-30 DIAGNOSIS — R42 Dizziness and giddiness: Secondary | ICD-10-CM | POA: Diagnosis not present

## 2024-07-30 DIAGNOSIS — K838 Other specified diseases of biliary tract: Secondary | ICD-10-CM | POA: Diagnosis not present

## 2024-07-30 DIAGNOSIS — I10 Essential (primary) hypertension: Secondary | ICD-10-CM | POA: Diagnosis not present

## 2024-07-30 DIAGNOSIS — R531 Weakness: Secondary | ICD-10-CM | POA: Diagnosis not present

## 2024-07-30 DIAGNOSIS — E860 Dehydration: Secondary | ICD-10-CM | POA: Diagnosis not present

## 2024-07-30 DIAGNOSIS — K8020 Calculus of gallbladder without cholecystitis without obstruction: Secondary | ICD-10-CM | POA: Diagnosis not present

## 2024-07-30 DIAGNOSIS — Z20822 Contact with and (suspected) exposure to covid-19: Secondary | ICD-10-CM | POA: Diagnosis not present

## 2024-07-30 DIAGNOSIS — Z1152 Encounter for screening for COVID-19: Secondary | ICD-10-CM | POA: Diagnosis not present

## 2024-07-30 DIAGNOSIS — N179 Acute kidney failure, unspecified: Secondary | ICD-10-CM | POA: Diagnosis not present

## 2024-07-30 DIAGNOSIS — G9389 Other specified disorders of brain: Secondary | ICD-10-CM | POA: Diagnosis not present

## 2024-07-30 DIAGNOSIS — R7989 Other specified abnormal findings of blood chemistry: Secondary | ICD-10-CM | POA: Diagnosis not present

## 2024-07-30 DIAGNOSIS — J189 Pneumonia, unspecified organism: Secondary | ICD-10-CM | POA: Diagnosis not present

## 2024-07-30 DIAGNOSIS — A419 Sepsis, unspecified organism: Secondary | ICD-10-CM | POA: Diagnosis not present

## 2024-07-30 DIAGNOSIS — E119 Type 2 diabetes mellitus without complications: Secondary | ICD-10-CM | POA: Diagnosis not present

## 2024-07-30 DIAGNOSIS — H6090 Unspecified otitis externa, unspecified ear: Secondary | ICD-10-CM | POA: Diagnosis not present

## 2024-07-30 DIAGNOSIS — Z8679 Personal history of other diseases of the circulatory system: Secondary | ICD-10-CM | POA: Diagnosis not present

## 2024-07-30 NOTE — Progress Notes (Signed)
   Was asked by EDP to admit patient.  On my arrival to the patient's room via telemedicine, I discussed admission with daughter [name] who requests transfer to [facility] where patient can see nephrology and cardiology.  Relayed info to Dr. Dallara and cancelled admission orders    [facility], DO Hospitalist, [facility] 07/30/24, 11:35 PM   I was outside of the hospital.  I spent 20 minutes on the video with the patient for a consultation. Total time was 75 minutes and over 50% of the service was counseling and/or coordination of care within the patient unit. Physical Exam performed with the assistance of bedside nursing staff.

## 2024-07-30 NOTE — ED Provider Notes (Addendum)
 Emergency Department Provider Note    ED Clinical Impression   Final diagnoses:  Weakness  History of CHF (congestive heart failure)  Elevated troponin  Otitis externa, unspecified chronicity, unspecified laterality, unspecified type  Acute kidney injury (Primary)    ED Assessment/Plan    History   Chief Complaint  Patient presents with  . Flu Like Symptoms   HPI  2220 hours. Patient's 78 year old female complex medical history including diabetes hypertension previous knee surgery previous stroke she had generalized malaise elevated blood pressures been sick for about 2 weeks previous ER visits x 2 by exam she [facility] Psorin triage cardiopulmonary general neuroexam negative abdomen benign nonspecific symptomatology will cover a broad spectrum of problems seems like she is unlikely to have an acute stroke I think consider possibly subacute cardiac sepsis dehydration metabolic process possible admission  Past Medical History[1]  Past Surgical History[2]  Family History[3]  Social History[4]  Review of Systems  Respiratory:  Negative for chest tightness and shortness of breath.   Neurological:  Positive for weakness.  All other systems reviewed and are negative.   Physical Exam   BP 182/83   Pulse 52   Temp 36.5 C (97.7 F) (Oral)   Resp 16   Ht 157.5 cm (5' 2)   Wt (!) 104.8 kg (231 lb)   LMP  (LMP Unknown)   SpO2 100%   BMI 42.25 kg/m   Physical Exam  Vital signs have been reviewed. Patient is well-appearing w/o respiratory distress shock or major trauma. HEENT is atraumatic.  Neck shows unimpaired range of motion. Chest No increased work of breathing or audible wheezing. Cardiac Good perfusion throughout. Abdomen is not distended. Extremities are without significant trauma. Skin no visualized rash. Neuro exam is grossly non-focal. Psych exam shows normal mood and behavior.  ED Course     Triage old records reviewed complex medical  regimen including meds for diabetes and hypertension  Medical Decision Making Non-STEMI, weakness, dehydration, pneumonia, sepsis  Amount and/or Complexity of Data Reviewed Independent Historian: caregiver External Data Reviewed: labs, radiology, ECG and notes. Labs: ordered. Decision-making details documented in ED Course. Radiology: ordered and independent interpretation performed. Decision-making details documented in ED Course. ECG/medicine tests: ordered and independent interpretation performed. Decision-making details documented in ED Course. Discussion of management or test interpretation with external provider(s): See dictated H&P  Risk Prescription drug management. Decision regarding hospitalization. Diagnosis or treatment significantly limited by social determinants of health. Risk Details: See dictated H&P    I have personal reviewed results of diagnostic testing including laboratory testing, diagnostic imaging and consultation.  I have also discussed the results with patient and available and family at bedside    Most recent echocardiogram August 2024 showed ejection fraction of greater than 55%  Patient was seen previously for neck pain she has been in the doctor's office for a variety of things recently she has been seen by family practice for systolic and diastolic congestive heart failure.  I do not see a lot of recent ER visits   Patient has had 2 recent ER visits on 13th and 17 for hypertension with poor control and musculoskeletal pain.  2320 hrs. Further diagnostic testing reviewed patient has a chronically elevated troponin although today's troponin is up slightly we will go ahead and continue to trend this I see no recent echocardiogram or stress testing we will go ahead and treat her as a non-STEMI I will discuss case with on-call cardiology and plan hospitalist admission She also  does have worsening CKD reviewed the case with on-call hospitalist  Critical  care:  35 min  EKG shows a sinus rhythm of 56 no acute ST-T wave changes normal intervals  1215 hrs. Case reviewed in detail with patient and family as well as with admitting hospitalist service family is requesting transfer to [facility] for further management discussed various issues with patient and family regarding possible delay in admission  0012 hrs. Case reviewed with the admitting hospitalist at [facility] they have agreed to accept the patient in transfer awaiting bed availability and transportation    Dallara, Norleen Agent, MD 07/30/24 2226    Dallara, John James, MD 07/30/24 2252    Dallara, John James, MD 07/30/24 [id number]    Dallara, John James, MD 07/30/24 7676    Dallara, John James, MD 07/30/24 2327    Dallara, John James, MD 07/30/24 2332    Dallara, John James, MD 07/31/24 0014       [1] Past Medical History: Diagnosis Date  . Back pain   . Diabetes mellitus       . Hypertension   . Stroke        1 stroke in 2006  [2] Past Surgical History: Procedure Laterality Date  . HYSTERECTOMY    . KNEE SURGERY     2 knee replacements  [3] Family History Problem Relation Age of Onset  . Cancer Mother   . Diabetes Father   [4] Social History Socioeconomic History  . Marital status: Single    Spouse name: None  . Number of children: None  . Years of education: None  . Highest education level: None  Tobacco Use  . Smoking status: Never  . Smokeless tobacco: Never  Vaping Use  . Vaping status: Never Used  Substance and Sexual Activity  . Alcohol use: No  . Drug use: No  . Sexual activity: Not Currently   Social Drivers of Health   Financial Resource Strain: Low Risk  (07/20/2024)   Overall Financial Resource Strain (CARDIA)   . Difficulty of Paying Living Expenses: Not hard at all  Food Insecurity: No Food Insecurity (07/20/2024)   Hunger Vital Sign   . Worried About Programme researcher, broadcasting/film/video in the Last Year: Never true   . Ran Out of Food  in the Last Year: Never true  Transportation Needs: Unmet Transportation Needs (07/20/2024)   PRAPARE - Transportation   . Lack of Transportation (Medical): Yes   . Lack of Transportation (Non-Medical): Yes  Physical Activity: Inactive (07/20/2024)   Exercise Vital Sign   . Days of Exercise per Week: 0 days   . Minutes of Exercise per Session: 0 min  Stress: No Stress Concern Present (07/20/2024)   [employer] of Occupational Health - Occupational Stress Questionnaire   . Feeling of Stress: Not at all  Social Connections: Socially Isolated (07/20/2024)   Social Connection and Isolation Panel   . Frequency of Communication with Friends and Family: More than three times a week   . Frequency of Social Gatherings with Friends and Family: Once a week   . Attends Religious Services: Never   . Active Member of Clubs or Organizations: No   . Attends Banker Meetings: Never   . Marital Status: Never married  Housing: Low Risk  (07/20/2024)   Housing   . Within the past 12 months, have you ever stayed: outside, in a car, in a tent, in an overnight shelter, or temporarily in someone else's home (  i.e. couch-surfing)?: No   . Are you worried about losing your housing?: No   Dallara, Norleen Agent, MD 07/31/24 [phone]

## 2024-07-31 ENCOUNTER — Encounter (HOSPITAL_COMMUNITY): Payer: Self-pay

## 2024-07-31 DIAGNOSIS — K8020 Calculus of gallbladder without cholecystitis without obstruction: Secondary | ICD-10-CM | POA: Diagnosis not present

## 2024-07-31 DIAGNOSIS — K838 Other specified diseases of biliary tract: Secondary | ICD-10-CM | POA: Diagnosis not present

## 2024-07-31 NOTE — ED Provider Notes (Signed)
 ED Progress Note   Patient was seen and examined today during my daily  rounds.  Patient remains medically stable for transfer. Patient is still waiting for a bed at outside hospital. Patient's care discussed with nursing staff.  Events of the past 24 hours include: Patient presented overnight.  Patient is awaiting transfer to [facility].  Patient is doing well.  Has no complaints this morning.  Physical examination:  BP 188/79   Pulse 53   Temp 36.4 C (97.5 F) (Oral)   Resp 15   Ht 157.5 cm (5' 2)   Wt (!) 104.8 kg (231 lb)   LMP  (LMP Unknown)   SpO2 97%   BMI 42.25 kg/m   General: No cardiopulmonary distress. Respiratory: Positive for auscultation bilaterally. Cardiovascular: Regular rate and rhythm.  Medical Decision Making:  Assessment and Plan:  Briefly, Beth [name] is a 78 y.o. female  who presents with AKI, elevated troponin.  Patient is stable for transfer.  Will restart patient's home medications while awaiting transfer.   Diagnosis:  1. Acute kidney injury   2. Weakness   3. History of CHF (congestive heart failure)   4. Elevated troponin   5. Otitis externa, unspecified chronicity, unspecified laterality, unspecified type    Condition: Stable Disposition: Transfer  This chart has been completed using Engineer, civil (consulting) software, and while attempts have been made to ensure accuracy, certain words and phrases may not be transcribed as intended.

## 2024-07-31 NOTE — ED Notes (Signed)
 Nursing staff provided incontinence care to patient. Clean sheets, draw sheets, and incontinence pads placed underneath patient. Patient cleaned with perineal wash. Purewick placed on patient to assist with incontinence. Pullup with pad placed on patient to assist with incontinence. Patient repositioned in bed for comfort. Warm blankets applied. Patient denies any other needs at this time. Trash can emptied. Bed in lowest position, call light within reach, and side rails engaged for safety.

## 2024-07-31 NOTE — ED Provider Notes (Signed)
 1800 hrs. Assumed care at shift change briefly patient is a 78 year old with worsening chronic kidney disease dehydration elevated troponin and history of congestive heart failure.  No other change in status awaiting transfer to [facility] we will recheck labs in the morning

## 2024-08-01 ENCOUNTER — Inpatient Hospital Stay (HOSPITAL_COMMUNITY)

## 2024-08-01 ENCOUNTER — Inpatient Hospital Stay (HOSPITAL_COMMUNITY)
Admission: EM | Admit: 2024-08-01 | Discharge: 2024-08-07 | DRG: 280 | Disposition: A | Discharge status: Skilled Nursing Facility | Source: Other Acute Inpatient Hospital | Attending: Internal Medicine | Admitting: Internal Medicine

## 2024-08-01 DIAGNOSIS — Z79899 Other long term (current) drug therapy: Secondary | ICD-10-CM

## 2024-08-01 DIAGNOSIS — R001 Bradycardia, unspecified: Secondary | ICD-10-CM | POA: Diagnosis not present

## 2024-08-01 DIAGNOSIS — I160 Hypertensive urgency: Secondary | ICD-10-CM | POA: Diagnosis present

## 2024-08-01 DIAGNOSIS — I5030 Unspecified diastolic (congestive) heart failure: Secondary | ICD-10-CM | POA: Diagnosis not present

## 2024-08-01 DIAGNOSIS — E876 Hypokalemia: Secondary | ICD-10-CM | POA: Diagnosis present

## 2024-08-01 DIAGNOSIS — E669 Obesity, unspecified: Secondary | ICD-10-CM | POA: Insufficient documentation

## 2024-08-01 DIAGNOSIS — N1832 Chronic kidney disease, stage 3b: Secondary | ICD-10-CM | POA: Diagnosis present

## 2024-08-01 DIAGNOSIS — I340 Nonrheumatic mitral (valve) insufficiency: Secondary | ICD-10-CM | POA: Diagnosis present

## 2024-08-01 DIAGNOSIS — I130 Hypertensive heart and chronic kidney disease with heart failure and stage 1 through stage 4 chronic kidney disease, or unspecified chronic kidney disease: Secondary | ICD-10-CM | POA: Diagnosis present

## 2024-08-01 DIAGNOSIS — E119 Type 2 diabetes mellitus without complications: Secondary | ICD-10-CM

## 2024-08-01 DIAGNOSIS — R2689 Other abnormalities of gait and mobility: Secondary | ICD-10-CM | POA: Diagnosis not present

## 2024-08-01 DIAGNOSIS — Z881 Allergy status to other antibiotic agents status: Secondary | ICD-10-CM

## 2024-08-01 DIAGNOSIS — Z8673 Personal history of transient ischemic attack (TIA), and cerebral infarction without residual deficits: Secondary | ICD-10-CM

## 2024-08-01 DIAGNOSIS — E872 Acidosis, unspecified: Secondary | ICD-10-CM | POA: Diagnosis present

## 2024-08-01 DIAGNOSIS — J029 Acute pharyngitis, unspecified: Secondary | ICD-10-CM | POA: Diagnosis not present

## 2024-08-01 DIAGNOSIS — I5033 Acute on chronic diastolic (congestive) heart failure: Secondary | ICD-10-CM

## 2024-08-01 DIAGNOSIS — Z888 Allergy status to other drugs, medicaments and biological substances status: Secondary | ICD-10-CM

## 2024-08-01 DIAGNOSIS — Z6837 Body mass index (BMI) 37.0-37.9, adult: Secondary | ICD-10-CM

## 2024-08-01 DIAGNOSIS — E66812 Obesity, class 2: Secondary | ICD-10-CM | POA: Diagnosis not present

## 2024-08-01 DIAGNOSIS — I959 Hypotension, unspecified: Secondary | ICD-10-CM | POA: Diagnosis not present

## 2024-08-01 DIAGNOSIS — R262 Difficulty in walking, not elsewhere classified: Secondary | ICD-10-CM | POA: Diagnosis present

## 2024-08-01 DIAGNOSIS — Z1152 Encounter for screening for COVID-19: Secondary | ICD-10-CM | POA: Diagnosis not present

## 2024-08-01 DIAGNOSIS — I214 Non-ST elevation (NSTEMI) myocardial infarction: Principal | ICD-10-CM | POA: Diagnosis present

## 2024-08-01 DIAGNOSIS — N179 Acute kidney failure, unspecified: Secondary | ICD-10-CM | POA: Diagnosis present

## 2024-08-01 DIAGNOSIS — I700 Atherosclerosis of aorta: Secondary | ICD-10-CM | POA: Diagnosis not present

## 2024-08-01 DIAGNOSIS — H608X2 Other otitis externa, left ear: Secondary | ICD-10-CM | POA: Diagnosis not present

## 2024-08-01 DIAGNOSIS — I6782 Cerebral ischemia: Secondary | ICD-10-CM | POA: Diagnosis not present

## 2024-08-01 DIAGNOSIS — R7989 Other specified abnormal findings of blood chemistry: Secondary | ICD-10-CM

## 2024-08-01 DIAGNOSIS — R112 Nausea with vomiting, unspecified: Secondary | ICD-10-CM | POA: Diagnosis not present

## 2024-08-01 DIAGNOSIS — R5381 Other malaise: Secondary | ICD-10-CM | POA: Diagnosis not present

## 2024-08-01 DIAGNOSIS — E785 Hyperlipidemia, unspecified: Secondary | ICD-10-CM | POA: Diagnosis present

## 2024-08-01 DIAGNOSIS — N189 Chronic kidney disease, unspecified: Secondary | ICD-10-CM | POA: Diagnosis present

## 2024-08-01 DIAGNOSIS — R0989 Other specified symptoms and signs involving the circulatory and respiratory systems: Secondary | ICD-10-CM | POA: Diagnosis not present

## 2024-08-01 DIAGNOSIS — H6090 Unspecified otitis externa, unspecified ear: Secondary | ICD-10-CM | POA: Diagnosis present

## 2024-08-01 DIAGNOSIS — R627 Adult failure to thrive: Secondary | ICD-10-CM | POA: Diagnosis present

## 2024-08-01 DIAGNOSIS — Z555 Less than a high school diploma: Secondary | ICD-10-CM

## 2024-08-01 DIAGNOSIS — H6690 Otitis media, unspecified, unspecified ear: Secondary | ICD-10-CM | POA: Diagnosis not present

## 2024-08-01 DIAGNOSIS — M6281 Muscle weakness (generalized): Secondary | ICD-10-CM | POA: Diagnosis not present

## 2024-08-01 DIAGNOSIS — I161 Hypertensive emergency: Secondary | ICD-10-CM | POA: Diagnosis not present

## 2024-08-01 DIAGNOSIS — M109 Gout, unspecified: Secondary | ICD-10-CM | POA: Diagnosis present

## 2024-08-01 DIAGNOSIS — M25861 Other specified joint disorders, right knee: Secondary | ICD-10-CM | POA: Diagnosis not present

## 2024-08-01 DIAGNOSIS — R9431 Abnormal electrocardiogram [ECG] [EKG]: Secondary | ICD-10-CM | POA: Diagnosis not present

## 2024-08-01 DIAGNOSIS — Z8249 Family history of ischemic heart disease and other diseases of the circulatory system: Secondary | ICD-10-CM

## 2024-08-01 DIAGNOSIS — Z7401 Bed confinement status: Secondary | ICD-10-CM | POA: Diagnosis not present

## 2024-08-01 DIAGNOSIS — E871 Hypo-osmolality and hyponatremia: Secondary | ICD-10-CM | POA: Diagnosis present

## 2024-08-01 DIAGNOSIS — Z91013 Allergy to seafood: Secondary | ICD-10-CM

## 2024-08-01 DIAGNOSIS — Z7982 Long term (current) use of aspirin: Secondary | ICD-10-CM

## 2024-08-01 DIAGNOSIS — R451 Restlessness and agitation: Secondary | ICD-10-CM | POA: Diagnosis present

## 2024-08-01 DIAGNOSIS — G9341 Metabolic encephalopathy: Secondary | ICD-10-CM | POA: Diagnosis not present

## 2024-08-01 DIAGNOSIS — E1122 Type 2 diabetes mellitus with diabetic chronic kidney disease: Secondary | ICD-10-CM | POA: Diagnosis not present

## 2024-08-01 DIAGNOSIS — Z833 Family history of diabetes mellitus: Secondary | ICD-10-CM

## 2024-08-01 DIAGNOSIS — R2681 Unsteadiness on feet: Secondary | ICD-10-CM | POA: Diagnosis not present

## 2024-08-01 DIAGNOSIS — R1311 Dysphagia, oral phase: Secondary | ICD-10-CM | POA: Diagnosis not present

## 2024-08-01 DIAGNOSIS — Z886 Allergy status to analgesic agent status: Secondary | ICD-10-CM

## 2024-08-01 DIAGNOSIS — R0689 Other abnormalities of breathing: Secondary | ICD-10-CM | POA: Diagnosis not present

## 2024-08-01 DIAGNOSIS — H60502 Unspecified acute noninfective otitis externa, left ear: Secondary | ICD-10-CM | POA: Diagnosis present

## 2024-08-01 DIAGNOSIS — T8453XS Infection and inflammatory reaction due to internal right knee prosthesis, sequela: Secondary | ICD-10-CM

## 2024-08-01 DIAGNOSIS — N183 Chronic kidney disease, stage 3 unspecified: Secondary | ICD-10-CM | POA: Diagnosis not present

## 2024-08-01 DIAGNOSIS — R7889 Finding of other specified substances, not normally found in blood: Secondary | ICD-10-CM | POA: Diagnosis not present

## 2024-08-01 DIAGNOSIS — I517 Cardiomegaly: Secondary | ICD-10-CM | POA: Diagnosis not present

## 2024-08-01 DIAGNOSIS — R41841 Cognitive communication deficit: Secondary | ICD-10-CM | POA: Diagnosis not present

## 2024-08-01 DIAGNOSIS — R0602 Shortness of breath: Secondary | ICD-10-CM | POA: Diagnosis not present

## 2024-08-01 DIAGNOSIS — Z7984 Long term (current) use of oral hypoglycemic drugs: Secondary | ICD-10-CM

## 2024-08-01 DIAGNOSIS — Z96651 Presence of right artificial knee joint: Secondary | ICD-10-CM | POA: Diagnosis not present

## 2024-08-01 DIAGNOSIS — D649 Anemia, unspecified: Secondary | ICD-10-CM | POA: Diagnosis not present

## 2024-08-01 DIAGNOSIS — Z90710 Acquired absence of both cervix and uterus: Secondary | ICD-10-CM

## 2024-08-01 DIAGNOSIS — I440 Atrioventricular block, first degree: Secondary | ICD-10-CM | POA: Diagnosis present

## 2024-08-01 DIAGNOSIS — Z7983 Long term (current) use of bisphosphonates: Secondary | ICD-10-CM

## 2024-08-01 DIAGNOSIS — Z8041 Family history of malignant neoplasm of ovary: Secondary | ICD-10-CM

## 2024-08-01 LAB — CBC WITH DIFFERENTIAL/PLATELET
Abs Immature Granulocytes: 0.05 K/uL (ref 0.00–0.07)
Basophils Absolute: 0 K/uL (ref 0.0–0.1)
Basophils Relative: 0 %
Eosinophils Absolute: 0.1 K/uL (ref 0.0–0.5)
Eosinophils Relative: 2 %
HCT: 40.3 % (ref 36.0–46.0)
Hemoglobin: 12.8 g/dL (ref 12.0–15.0)
Immature Granulocytes: 1 %
Lymphocytes Relative: 14 %
Lymphs Abs: 1.1 K/uL (ref 0.7–4.0)
MCH: 26.3 pg (ref 26.0–34.0)
MCHC: 31.8 g/dL (ref 30.0–36.0)
MCV: 82.8 fL (ref 80.0–100.0)
Monocytes Absolute: 0.6 K/uL (ref 0.1–1.0)
Monocytes Relative: 8 %
Neutro Abs: 5.8 K/uL (ref 1.7–7.7)
Neutrophils Relative %: 75 %
Platelets: 269 K/uL (ref 150–400)
RBC: 4.87 MIL/uL (ref 3.87–5.11)
RDW: 14.9 % (ref 11.5–15.5)
WBC: 7.7 K/uL (ref 4.0–10.5)
nRBC: 0 % (ref 0.0–0.2)

## 2024-08-01 LAB — C-REACTIVE PROTEIN: CRP: <0.5 mg/dL (ref <1.0)

## 2024-08-01 LAB — BLOOD GAS, ARTERIAL
Acid-Base Excess: 2.4 mmol/L — ABNORMAL HIGH (ref 0.0–2.0)
Bicarbonate: 26.4 mmol/L (ref 20.0–28.0)
Drawn by: 244901
O2 Saturation: 99.9 %
Patient temperature: 36.5
pCO2 arterial: 37 mmHg (ref 32–48)
pH, Arterial: 7.46 — ABNORMAL HIGH (ref 7.35–7.45)
pO2, Arterial: 105 mmHg (ref 83–108)

## 2024-08-01 LAB — LIPID PANEL
Cholesterol: 183 mg/dL (ref 0–200)
HDL: 47 mg/dL (ref >40)
LDL Cholesterol: 121 mg/dL — ABNORMAL HIGH (ref 0–99)
Total CHOL/HDL Ratio: 3.9 ratio
Triglycerides: 77 mg/dL (ref <150)
VLDL: 15 mg/dL (ref 0–40)

## 2024-08-01 LAB — ECHOCARDIOGRAM COMPLETE
AR max vel: 1.77 cm2
AV Peak grad: 6.3 mmHg
Ao pk vel: 1.25 m/s
Area-P 1/2: 2.62 cm2

## 2024-08-01 LAB — COMPREHENSIVE METABOLIC PANEL WITH GFR
ALT: 11 U/L (ref 0–44)
AST: 17 U/L (ref 15–41)
Albumin: 2.9 g/dL — ABNORMAL LOW (ref 3.5–5.0)
Alkaline Phosphatase: 76 U/L (ref 38–126)
Anion gap: 15 (ref 5–15)
BUN: 53 mg/dL — ABNORMAL HIGH (ref 8–23)
CO2: 22 mmol/L (ref 22–32)
Calcium: 8.5 mg/dL — ABNORMAL LOW (ref 8.9–10.3)
Chloride: 97 mmol/L — ABNORMAL LOW (ref 98–111)
Creatinine, Ser: 2.66 mg/dL — ABNORMAL HIGH (ref 0.44–1.00)
GFR, Estimated: 18 mL/min — ABNORMAL LOW (ref >60)
Glucose, Bld: 149 mg/dL — ABNORMAL HIGH (ref 70–99)
Potassium: 3.2 mmol/L — ABNORMAL LOW (ref 3.5–5.1)
Sodium: 134 mmol/L — ABNORMAL LOW (ref 135–145)
Total Bilirubin: 1.3 mg/dL — ABNORMAL HIGH (ref 0.0–1.2)
Total Protein: 6.3 g/dL — ABNORMAL LOW (ref 6.5–8.1)

## 2024-08-01 LAB — GLUCOSE, CAPILLARY
Glucose-Capillary: 157 mg/dL — ABNORMAL HIGH (ref 70–99)
Glucose-Capillary: 157 mg/dL — ABNORMAL HIGH (ref 70–99)
Glucose-Capillary: 154 mg/dL — ABNORMAL HIGH (ref 70–99)
Glucose-Capillary: 157 mg/dL — ABNORMAL HIGH (ref 70–99)

## 2024-08-01 LAB — D-DIMER, QUANTITATIVE: D-Dimer, Quant: 2.94 ug{FEU}/mL — ABNORMAL HIGH (ref 0.00–0.50)

## 2024-08-01 LAB — HEMOGLOBIN A1C
Hgb A1c MFr Bld: 6.5 % — ABNORMAL HIGH (ref 4.8–5.6)
Mean Plasma Glucose: 139.85 mg/dL

## 2024-08-01 LAB — CREATININE, URINE, RANDOM: Creatinine, Urine: 49 mg/dL

## 2024-08-01 LAB — RESP PANEL BY RT-PCR (RSV, FLU A&B, COVID)  RVPGX2
Influenza A by PCR: NEGATIVE
Influenza B by PCR: NEGATIVE
Resp Syncytial Virus by PCR: NEGATIVE
SARS Coronavirus 2 by RT PCR: NEGATIVE

## 2024-08-01 LAB — BRAIN NATRIURETIC PEPTIDE: B Natriuretic Peptide: 162.7 pg/mL — ABNORMAL HIGH (ref 0.0–100.0)

## 2024-08-01 LAB — TROPONIN I (HIGH SENSITIVITY)
Troponin I (High Sensitivity): 2146 ng/L (ref <18)
Troponin I (High Sensitivity): 2286 ng/L (ref <18)
Troponin I (High Sensitivity): 2066 ng/L (ref <18)
Troponin I (High Sensitivity): 2037 ng/L (ref <18)

## 2024-08-01 LAB — TSH: TSH: 0.472 u[IU]/mL (ref 0.350–4.500)

## 2024-08-01 LAB — SODIUM, URINE, RANDOM: Sodium, Ur: 87 mmol/L

## 2024-08-01 LAB — MAGNESIUM: Magnesium: 2.3 mg/dL (ref 1.7–2.4)

## 2024-08-01 MED ORDER — ATORVASTATIN CALCIUM 10 MG PO TABS: 20.0000 mg | ORAL_TABLET | Freq: Every evening | ORAL | Status: DC

## 2024-08-01 MED ORDER — ASPIRIN 81 MG PO TBEC
81.0000 mg | DELAYED_RELEASE_TABLET | Freq: Every day | ORAL | Status: DC
  Administered 2024-08-02 – 2024-08-07 (×6): 81 mg via ORAL
  Filled 2024-08-01 (×7): qty 1

## 2024-08-01 MED ORDER — DEXTROSE 50 % IV SOLN: 1.0000 | INTRAVENOUS | Status: DC | PRN

## 2024-08-01 MED ORDER — NITROGLYCERIN IN D5W 200-5 MCG/ML-% IV SOLN: 0.0000 ug/min | INTRAVENOUS | Status: DC

## 2024-08-01 MED ORDER — ALBUTEROL SULFATE (2.5 MG/3ML) 0.083% IN NEBU: 2.5000 mg | INHALATION_SOLUTION | Freq: Four times a day (QID) | RESPIRATORY_TRACT | Status: DC | PRN

## 2024-08-01 MED ORDER — SODIUM CHLORIDE 0.9% FLUSH
3.0000 mL | Freq: Two times a day (BID) | INTRAVENOUS | Status: DC
  Administered 2024-08-01 – 2024-08-07 (×13): 3 mL via INTRAVENOUS

## 2024-08-01 MED ORDER — ACETAMINOPHEN 325 MG PO TABS
650.0000 mg | ORAL_TABLET | Freq: Four times a day (QID) | ORAL | Status: DC | PRN
  Administered 2024-08-03 – 2024-08-06 (×7): 650 mg via ORAL
  Filled 2024-08-01 (×7): qty 2

## 2024-08-01 MED ORDER — ATORVASTATIN CALCIUM 80 MG PO TABS
80.0000 mg | ORAL_TABLET | Freq: Every evening | ORAL | Status: DC
  Administered 2024-08-02 – 2024-08-06 (×5): 80 mg via ORAL
  Filled 2024-08-01 (×7): qty 1

## 2024-08-01 MED ORDER — HYDRALAZINE HCL 50 MG PO TABS
100.0000 mg | ORAL_TABLET | Freq: Three times a day (TID) | ORAL | Status: DC
  Administered 2024-08-01 – 2024-08-07 (×17): 100 mg via ORAL
  Filled 2024-08-01 (×19): qty 2

## 2024-08-01 MED ORDER — FUROSEMIDE 10 MG/ML IJ SOLN
40.0000 mg | Freq: Every day | INTRAMUSCULAR | Status: DC
  Administered 2024-08-01 – 2024-08-02 (×2): 40 mg via INTRAVENOUS
  Filled 2024-08-01 (×2): qty 4

## 2024-08-01 MED ORDER — NYSTATIN 100000 UNIT/ML MT SUSP
5.0000 mL | Freq: Four times a day (QID) | OROMUCOSAL | Status: DC
  Administered 2024-08-01 – 2024-08-07 (×19): 500000 [IU] via ORAL
  Filled 2024-08-01 (×22): qty 5

## 2024-08-01 MED ORDER — SODIUM CHLORIDE 0.9 % IV SOLN: INTRAVENOUS | Status: DC

## 2024-08-01 MED ORDER — POTASSIUM CHLORIDE 10 MEQ/100ML IV SOLN
10.0000 meq | INTRAVENOUS | Status: AC
  Administered 2024-08-01 (×3): 10 meq via INTRAVENOUS
  Filled 2024-08-01 (×3): qty 100

## 2024-08-01 MED ORDER — SODIUM CHLORIDE 0.9 % IV SOLN
INTRAVENOUS | Status: AC | PRN
  Administered 2024-08-01: 10 mL via INTRAVENOUS

## 2024-08-01 MED ORDER — PIPERACILLIN-TAZOBACTAM 3.375 G IVPB
3.3750 g | Freq: Three times a day (TID) | INTRAVENOUS | Status: DC
  Administered 2024-08-01 – 2024-08-05 (×14): 3.375 g via INTRAVENOUS
  Filled 2024-08-01 (×29): qty 50

## 2024-08-01 MED ORDER — POTASSIUM CHLORIDE CRYS ER 20 MEQ PO TBCR
40.0000 meq | EXTENDED_RELEASE_TABLET | ORAL | Status: AC
  Administered 2024-08-01: 40 meq via ORAL
  Filled 2024-08-01: qty 2

## 2024-08-01 MED ORDER — HEPARIN BOLUS VIA INFUSION
2000.0000 [IU] | Freq: Once | INTRAVENOUS | Status: AC
  Administered 2024-08-01: 2000 [IU] via INTRAVENOUS
  Filled 2024-08-01: qty 2000

## 2024-08-01 MED ORDER — ACETAMINOPHEN 650 MG RE SUPP
650.0000 mg | Freq: Four times a day (QID) | RECTAL | Status: DC | PRN
Start: 2024-08-01 — End: 2024-08-07

## 2024-08-01 MED ORDER — HYDRALAZINE HCL 20 MG/ML IJ SOLN
10.0000 mg | INTRAMUSCULAR | Status: DC | PRN
  Administered 2024-08-01 – 2024-08-03 (×2): 10 mg via INTRAVENOUS
  Filled 2024-08-01 (×3): qty 1

## 2024-08-01 MED ORDER — HEPARIN (PORCINE) 25000 UT/250ML-% IV SOLN
1100.0000 [IU]/h | INTRAVENOUS | Status: DC
  Administered 2024-08-01: 1100 [IU]/h via INTRAVENOUS
  Filled 2024-08-01: qty 250

## 2024-08-01 MED ORDER — CIPROFLOXACIN-DEXAMETHASONE 0.3-0.1 % OT SUSP
4.0000 [drp] | Freq: Two times a day (BID) | OTIC | Status: DC
  Administered 2024-08-01 – 2024-08-07 (×13): 4 [drp] via OTIC
  Filled 2024-08-01: qty 7.5

## 2024-08-01 MED ORDER — HEPARIN SODIUM (PORCINE) 5000 UNIT/ML IJ SOLN
5000.0000 [IU] | Freq: Three times a day (TID) | INTRAMUSCULAR | Status: DC
  Administered 2024-08-01: 5000 [IU] via SUBCUTANEOUS
  Filled 2024-08-01 (×2): qty 1

## 2024-08-01 MED ORDER — TRIMETHOBENZAMIDE HCL 100 MG/ML IM SOLN
200.0000 mg | Freq: Four times a day (QID) | INTRAMUSCULAR | Status: DC | PRN
  Administered 2024-08-01 – 2024-08-06 (×3): 200 mg via INTRAMUSCULAR
  Filled 2024-08-01 (×4): qty 2

## 2024-08-01 MED ORDER — DILTIAZEM HCL ER COATED BEADS 240 MG PO CP24
240.0000 mg | ORAL_CAPSULE | Freq: Every evening | ORAL | Status: DC
  Administered 2024-08-01 – 2024-08-06 (×6): 240 mg via ORAL
  Filled 2024-08-01 (×6): qty 1

## 2024-08-01 MED ORDER — ISOSORBIDE DINITRATE 10 MG PO TABS
10.0000 mg | ORAL_TABLET | Freq: Three times a day (TID) | ORAL | Status: DC
  Administered 2024-08-01 (×2): 10 mg via ORAL
  Filled 2024-08-01 (×2): qty 1

## 2024-08-01 NOTE — Significant Event (Signed)
 Patient admitted at 0300 transfer from [facility], somelent, [facility] left ear greater with Drainage.  no temp recording, 02 sats 98 on RA RR 18-20 HR 42, Blp 176/99. Nitro paste patch on Right arm. Removed and cleaned. EKG completed, MD made aware. Warm blankets applied.

## 2024-08-01 NOTE — H&P (Addendum)
 History and Physical    Patient: Beth [name] FMW:[id number] DOB: 07/06/1946 DOA: 08/01/2024 DOS: the patient was seen and examined on 08/01/2024 PCP: Zollie Lowers, MD  Patient coming from: Transferred from [facility]  Chief Complaint: Maxcine  HPI: Beth [name] is a 78 y.o. female with medical history significant of hypertension, hyperlipidemia, diastolic CHF, CVA, diabetes mellitus type 2, and chronic kidney disease stage IIIb presents with elevated blood pressure, earache, throat ache, headache, nausea, and weakness for further evaluation. She is accompanied by her daughter..  Noted to have had 2 ER visits with uncontrolled blood pressure and musculoskeletal pain on 8/13 and 8/17.    She has been experiencing earache, throat ache, headache, nausea, and overall weakness for the past two weeks. Despite three ER visits and two doctor visits in the last ten days, her symptoms have persisted. She has been using eardrops for an ear infection diagnosed last week, but they have not been effective. Her ear was recently cleaned, which alleviated the pain temporarily. She has developed white plaques in her throat, which appeared two to three days ago after an ER visit.   She has extremely high blood pressure and is on multiple medications for hypertension, including hydralazine  100 mg three times daily, nebivolol  20 mg once daily, olmesartan  40 mg once daily, isosorbide  10 mg three times daily, furosemide  20 mg, and diltiazem  240 mg in the evening.  Her daughter notes that despite taking these medications she has not had adequate blood pressure control.  She has a history of diabetes, but her daughter states that her blood sugar levels were never in the diabetic range, leading to discontinuation of diabetes medication. She has not been eating due to a very sore throat, although she feels hungry.  She has been experiencing nausea and vomiting, for which she was given Zofran . Her daughter inquired if  the ear infection could have spread to her brain, causing these symptoms.  Patient does report having substernal chest discomfort when taking a deep breath.  Patient does complain of having sore throat and mouth, including her tongue.  She uses a walker to move around her home and a wheelchair for outings. She has had several right knee surgeries, which make walking difficult. Her daughter mentioned that she had a cement block in her knee.  At the outside facility labs from 8/23 noted WBC 9.1, hemoglobin 13.5, sodium 133, potassium 3.3, chloride 95, BUN 65, creatinine 3.31, proBNP 1649,  high-sensitivity troponin 374-> 358-> 350,  and delta at  hsTrop 16->8.  CT scan of the head showed no acute intercranial abnormality with opacification of the right maxillary sinus that was new.  Chest x-ray noted mild cardiomegaly with left ventricular prominence with lungs otherwise noted to be clear.  Urinalysis was significant for protein and glucose, but no signs for infection.  CT scan of the abdomen pelvis was also done that did not show any signs of hydronephrosis or ureteral calculus. Workup notable for left ear otitis externa started on clindamycin and Rocephin     Review of Systems: As mentioned in the history of present illness. All other systems reviewed and are negative. Past Medical History:  Diagnosis Date   Anemia    Arthritis    Knee both knees   Blood transfusion without reported diagnosis 2012   anemia;pt denies transfusion stated was only on iron  tablet   Cataract    left   CKD (chronic kidney disease), stage III (HCC)    Diabetes mellitus without  complication (HCC)    Family history of anesthesia complication    sister very slow to awaken after anesthesia;severe vomiting    Gout    left elbow   Herpes infection 08/09/2014   Saw doctor Wed. 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   Nocturia    3-4 times per  night   Osteoarthritis of both sacroiliac joints (HCC) 08/22/2019   Osteomyelitis of right tibia (HCC) 07/23/2020   Other acute osteomyelitis, right femur (HCC) 07/23/2020   Pseudomonas aeruginosa infection 12/15/2018   Pseudomonas aeruginosa infection 12/15/2018   Septic arthritis of knee, right (HCC) 08/27/2020   Stroke (HCC) 2006   x 1 no deficits noted    Past Surgical History:  Procedure Laterality Date   ABDOMINAL HYSTERECTOMY  1983   CARPAL TUNNEL RELEASE Right 1983   colonscopy  June 21, 2014   EXCISIONAL TOTAL KNEE ARTHROPLASTY WITH ANTIBIOTIC SPACERS Right 02/16/2015   Procedure: RIGHT KNEE RESECTION ARTHROPLASTY WITH ANTIBIOTIC SPACERS;  Surgeon: Dempsey Moan, MD;  Location: [facility];  Service: Orthopedics;  Laterality: Right;   EXCISIONAL TOTAL KNEE ARTHROPLASTY WITH ANTIBIOTIC SPACERS Right 11/03/2018   Procedure: Right knee resection arthroplasty; antibiotic spacer;  Surgeon: Moan Dempsey, MD;  Location: [facility];  Service: Orthopedics;  Laterality: Right;  Adductor Block   I & D KNEE WITH POLY EXCHANGE Right 10/02/2014   Procedure: IRRIGATION AND DEBRIDEMENT RIGHT KNEE WITH POLY EXCHANGE;  Surgeon: Dempsey Moan GAILS, MD;  Location: [facility];  Service: Orthopedics;  Laterality: Right;   I & D KNEE WITH POLY EXCHANGE Right 08/27/2020   Procedure: IRRIGATION AND DEBRIDEMENT; SPACER EXCHANGE RIGHT KNEE with multiple specimens;  Surgeon: Moan Dempsey, MD;  Location: [facility];  Service: Orthopedics;  Laterality: Right;    INCISION AND DRAINAGE OF WOUND Right 01/14/2017   Procedure: IRRIGATION AND DEBRIDEMENT WOUND;  Surgeon: Dempsey Moan, MD;  Location: [facility];  Service: Orthopedics;  Laterality: Right;  requests   JOINT REPLACEMENT  06/2014   right knee   nasal cauterization  2012   PATELLAR TENDON REPAIR Right 08/11/2014   Procedure: RIGHT PATELLA TENDON REPAIR;  Surgeon: Dempsey Moan GAILS, MD;  Location: [facility];  Service: Orthopedics;  Laterality: Right;   REIMPLANTATION OF  TOTAL KNEE Right 05/23/2015   Procedure: RIGHT KNEE ARTHROPLASTY REIMPLANTATION;  Surgeon: Dempsey Moan, MD;  Location: [facility];  Service: Orthopedics;  Laterality: Right;   TOTAL KNEE ARTHROPLASTY Right 07/03/2014   Procedure: RIGHT TOTAL KNEE ARTHROPLASTY;  Surgeon: Dempsey Moan GAILS, MD;  Location: [facility];  Service: Orthopedics;  Laterality: Right;   TUBAL LIGATION     Social History:  reports that she has never smoked. She has never used smokeless tobacco. She reports that she does not drink alcohol and does not use drugs.  Allergies  Allergen Reactions   Cefdinir Swelling and Rash   Aleve [Naproxen Sodium] Other (See Comments)    Heart races   Dorethia Harts ] Other (See Comments)    Nose bleeding   Ciprofloxacin  Other (See Comments)    Possible hamstring tendinopathy   Clonidine  Derivatives Other (See Comments)    Dizziness and weakness   Shellfish Allergy Nausea And Vomiting    Family History  Problem Relation Age of Onset   Ovarian cancer Mother    Cancer Mother    Peripheral vascular disease Father        with amputation of both legs  Hypertension Father    Heart disease Brother 67   Kidney disease Daughter    Heart disease Daughter 3   Diabetes Son    Diabetes Son    Colon cancer Neg Hx    Esophageal cancer Neg Hx    Stomach cancer Neg Hx    Rectal cancer Neg Hx     Prior to Admission medications   Medication Sig Start Date End Date Taking? Authorizing Provider  acetaminophen  (TYLENOL ) 500 MG tablet Take 2 tablets (1,000 mg total) by mouth every 6 (six) hours as needed for mild pain. 09/28/18   Zollie Lowers, MD  alendronate  (FOSAMAX ) 70 MG tablet TAKE 1 TABLET WEEKLY (TAKE WITH 8OZ OF WATER  30 MINUTES BEFORE BREAKFAST) 05/16/24   Zollie Lowers, MD  atorvastatin  (LIPITOR ) 20 MG tablet TAKE 1 TABLET BY MOUTH EVERY EVENING 05/16/24   Zollie Lowers, MD  azelastine  (ASTELIN ) 0.1 % nasal spray Place 1 spray into both nostrils 2 (two) times daily. Use in each nostril as  directed 07/26/24   Deitra Morton Sebastian Nena, NP  azithromycin  (ZITHROMAX ) 250 MG tablet Take 2 tablets on day 1, then 1 tablet daily on days 2 through 5 07/27/24 08/01/24  [facility] Sebastian Nena, NP  colchicine  0.6 MG tablet Take twice daily for gout attack. (may take every two hours up to 6 doses at acute onset) 03/03/24   Zollie Lowers, MD  diltiazem  (CARDIZEM  CD) 240 MG 24 hr capsule TAKE ONE CAPSULE BY MOUTH DAILY 05/16/24   Zollie Lowers, MD  empagliflozin  (JARDIANCE ) 10 MG TABS tablet Take 1 tablet (10 mg total) by mouth daily. Patient not taking: Reported on 07/25/2024 09/29/23   Zollie Lowers, MD  ferrous sulfate  325 (65 FE) MG tablet Take 325 mg by mouth 2 (two) times a week.    [provider]  furosemide  (LASIX ) 20 MG tablet Take 20 mg by mouth daily. 02/12/24   [provider]  hydrALAZINE  (APRESOLINE ) 25 MG tablet TAKE ONE (1) TABLET BY MOUTH 3 TIMES DAILY Patient taking differently: 100 mg 3 (three) times daily. 07/18/24   Zollie Lowers, MD  hydrochlorothiazide  (HYDRODIURIL ) 25 MG tablet Take 25 mg by mouth daily. 07/20/24 08/19/24  [provider]  isosorbide  dinitrate (ISORDIL ) 10 MG tablet TAKE ONE (1) TABLET BY MOUTH 3 TIMES DAILY 10/05/23   Zollie Lowers, MD  Nebivolol  HCl 20 MG TABS TAKE ONE (1) TABLET EACH DAY 03/03/24   Zollie Lowers, MD  olmesartan  (BENICAR ) 40 MG tablet TAKE ONE (1) TABLET EACH DAY 07/18/24   Zollie Lowers, MD  ondansetron  (ZOFRAN ) 4 MG tablet Take 1 tablet (4 mg total) by mouth every 8 (eight) hours as needed for nausea or vomiting. 07/25/24   Deitra Morton Sebastian Nena, NP    Physical Exam: Vitals:   08/01/24 0807 08/01/24 1130  BP:  (!) 166/97  Pulse: (!) 48 (!) 48  Resp: 15 12  Temp: 97.6 F (36.4 C) 97.8 F (36.6 C)  TempSrc:  Oral  SpO2: 98% 99%    Constitutional: Early female who appears chronically ill Eyes: PERRL, lids and conjunctivae normal ENMT: Mucous membranes are dry with white plaques noted of the tongue  and erythema noted of the posterior oropharynx with swelling.  Swelling of the left ear with open wounds present. Neck: normal, supple, no neck rigidity appreciated Respiratory: Creased overall aeration with some rales appreciated in right lower lung field. No accessory muscle use.  Cardiovascular: Bradycardic with positive systolic murmur present. Trace lower extremity edema present.SABRA  Abdomen: no tenderness, no masses palpated.  Bowel sounds positive.  Musculoskeletal: no clubbing / cyanosis. No joint deformity upper and lower extremities. Good ROM, no contractures. Normal muscle tone.  Skin: no rashes, lesions, ulcers. No induration Neurologic: CN 2-12 grossly intact. Sensation intact, DTR normal. Strength 5/5 in all 4.  Psychiatric: Normal judgment and insight. Alert and oriented x 3. Normal mood.   Data Reviewed:   EKG reveals sinus bradycardia at 51 bpm with first-degree heart block and QTc prolonged at 508.  Reviewed labs, imaging, pertinent records as documented.  Assessment and Plan:  Elevated troponin Acute.  Patient presented with complaints of generalized malaise and substernal chest pain..  Found to have high-sensitivity troponins at the outside facility374-> 358-> 350  and delta at  hsTrop 16->8. - Admit to a cardiac telemetry - Trend high-sensitivity troponin - Heparin  drip per pharmacy.  May warrant VQ scan to rule out the possibility of a PE - Check echocardiogram - Cardiology consulted, will follow-up for any further recommendations   Hypertensive urgency Systolic blood pressures were initially elevated into the 200s, but noted to be bradycardic with rates in the 40s to 50s..   - Held beta-blocker due to bradycardia - Continue hydralazine  and Cardizem  as tolerated - Hydralazine  IV as needed for elevated blood pressure  Acute metabolic encephalopathy Patient noted to be more lethargic and altered.  No focal deficits appreciated on physical exam. - Neurochecks -  Check TSH(0.472) - Check ABG(pH- 7.46, PCO2-37, PO2- 105) - Check CT scan of the head  Heart failure with preserved ejection fraction Acute on chronic.  Patient does not appear grossly fluid overloaded on physical exam.  Lungs are fairly clear with some mild rales noted in the right lower lung field.  ProBNP 1649 at the outside facility. Last echocardiogram noted EF to be greater than 55% with grade 1 diastolic dysfunction 07/09/2023. - Strict I&O's and daily weights - Follow-up echocardiogram - Lasix  IV as recommended by cardiology  Acute kidney injury superimposed on chronic kidney disease Patient presented to the outside facility with creatinine elevated up to 3.31 at the outside facility.  Repeat check here noted to be 2.66 with BUN 53.  Baseline creatinine previously noted to be around 1.5- 2. - Check urine creatinine and urine sodium - Initially placed on gentle IV fluids of normal saline, but were discontinued due to complaints of shortness of breath  Otitis externa Present on arrival patient had been tried on Neomycin  3 times daily without improvement in symptoms.  At the outside facility patient had been given clindamycin and Rocephin . - Ciprofloxacin  eardrops twice daily - Zosyn  IV in the acute setting as unable to give Cipro  due to prolonged QT interval  Sore throat Patient with white plaques on the tongue and erythema of the posterior oropharynx.  Question possibility of thrush. - Aspiration precautions with elevation head of the bed - Nystatin  swish and swallow ordered  Prolonged QT interval QT prolonged at 508. - Correct electrolyte abnormalities - Avoid further QT prolonging medications  Controlled diabetes mellitus type 2, without long-term use of insulin  Last available hemoglobin A1c was noted to be 5.8 in 02/2024.  Patient at one point had elevated hemoglobin A1c of 6.6 back in 2017, but since has been less. - Recheck hemoglobin A1c  Debility Right knee  injury Patient has difficulty with ambulation due to prior right prosthetic knee infection requiring resection of arthroplasty and placement of antibiotic spacer. - PT consulted to eval in a.m.  DVT prophylaxis:  Heparin  Advance Care Planning:   Code Status: Full Code   Consults: Cardiology  Family Communication: Daughter updated at bedside  Severity of Illness: The appropriate patient status for this patient is INPATIENT. Inpatient status is judged to be reasonable and necessary in order to provide the required intensity of service to ensure the patient's safety. The patient's presenting symptoms, physical exam findings, and initial radiographic and laboratory data in the context of their chronic comorbidities is felt to place them at high risk for further clinical deterioration. Furthermore, it is not anticipated that the patient will be medically stable for discharge from the hospital within 2 midnights of admission.   * I certify that at the point of admission it is my clinical judgment that the patient will require inpatient hospital care spanning beyond 2 midnights from the point of admission due to high intensity of service, high risk for further deterioration and high frequency of surveillance required.*  Author: Maximino DELENA Sharps, MD 08/01/2024 7:15 AM  For on call review [email or web address].

## 2024-08-01 NOTE — Significant Event (Addendum)
 Rapid Response Event Note   Reason for Call :  Lethargic, hypertensive, left weakness  Initial Focused Assessment:  Patient A&O x2, somnolent and awakes to voice, able to follow commands. Only c/o difficulty breathing/pain across chest with breathing. No current distress. No focal deficits seen. Skin warm and dry, some edema present. Lungs course, diminished.   167/95 (116) HR 53 RR 16 O2 100% 3L [place] CBG 157  Interventions/Plan of Care:  CXR already this afternoon ABG TRH MD to bedside Labs Hep gtt ordered May need bipap ECHO, cards consult pending  Pt vomited while in room; orally suctioned, mouth care performed, primary RN gave PRN   Event Summary:  MD Notified: FABIENE Sharps MD Call Time: 1500 Arrival Time: 1505 End Time: 1600  Tonna Chiquita POUR, RN

## 2024-08-01 NOTE — Progress Notes (Signed)
 PHARMACY - ANTICOAGULATION CONSULT NOTE  Pharmacy Consult for Heparin  Indication: CP, elevated troponin  Allergies  Allergen Reactions   Cefdinir Swelling and Rash   Aleve [Naproxen Sodium] Other (See Comments)    Heart races   Dorethia Redder ] Other (See Comments)    Nose bleeding   Ciprofloxacin  Other (See Comments)    Possible hamstring tendinopathy   Clonidine  Derivatives Other (See Comments)    Dizziness and weakness   Shellfish Allergy Nausea And Vomiting   Vital Signs: Temp: 97.7 F (36.5 C) (08/25 1233) Temp Source: Oral (08/25 1130) BP: 167/95 (08/25 1515) Pulse Rate: 53 (08/25 1515)  Labs: Recent Labs    08/01/24 0924 08/01/24 1214  HGB 12.8  --   HCT 40.3  --   PLT 269  --   CREATININE 2.66*  --   TROPONINIHS  --  2,146*    Estimated Creatinine Clearance: 20.3 mL/min (A) (by C-G formula based on SCr of 2.66 mg/dL (H)).   Medical History: Past Medical History:  Diagnosis Date   Anemia    Arthritis    Knee both knees   Blood transfusion without reported diagnosis 2012   anemia;pt denies transfusion stated was only on iron  tablet   Cataract    left   CKD (chronic kidney disease), stage III (HCC)    Diabetes mellitus without complication (HCC)    Family history of anesthesia complication    sister very slow to awaken after anesthesia;severe vomiting    Gout    left elbow   Herpes infection 08/09/2014   Saw doctor Wed. 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   Other acute osteomyelitis, right femur (HCC) 07/23/2020   Pseudomonas aeruginosa infection 12/15/2018   Pseudomonas aeruginosa infection 12/15/2018   Septic arthritis of knee, right (HCC) 08/27/2020   Stroke (HCC) 2006   x 1 no deficits noted     Assessment: 78 year old  female to begin heparin  for chest pain/elevated troponin Scr 2.66  Goal of Therapy:  Heparin  level 0.3-0.7 units/ml Monitor platelets by anticoagulation protocol: Yes   Plan:  Heparin  2000 units iv bolus x 1 Heparin  drip at 1100 units / hr Heparin  level in 8 hours Daily heparin  level, CBC  Thank you. Olam Monte, PharmD  08/01/2024,3:32 PM

## 2024-08-01 NOTE — Significant Event (Signed)
 Pt feels hot and SOB HR 40s -50s 02 sats 98% Temp 97.7 cut off [place]ugger

## 2024-08-01 NOTE — Consult Note (Addendum)
 Cardiology Consultation   Patient ID: Beth [name] MRN: [id number]; DOB: 02/28/1946  Admit date: 08/01/2024 Date of Consult: 08/01/2024  PCP:  Zollie Lowers, MD   [facility] Providers Cardiologist: Dr Lavona    Patient Profile: Beth [name] is a 78 y.o. female with a hx of mitral regurgitation, hypertension, CKD stage IIIb, chronic diastolic heart failure, first-degree AV block, dyslipidemia, type 2 diabetes, chronic lower extremity edema, CVA, who is being seen 08/01/2024 for the evaluation of non-STEMI at the request of Dr. Claudene.  History of Present Illness: Beth [name] with above PMH who was transferred from [facility] for further evaluation of numerous complaints.  Per chart review, patient went to [facility] ER 07/30/2024 evening for generalized malaise, feeling sick for 2 weeks, headache, throat pain, nausea with emesis, weakness, elevated blood pressure, ear pain with drainage.  Reportedly she was somnolent at ER, bradycardic with heart rate 40s, required Bair hugger for hypothermia.   Upon encounter today, she is actively having non-bloody emesis. She recurrently c/o SOB at rest, felt she can't breath. RRT has been called by the nurse. She was hypertensive 200s systolic, received hydralazine  IV and it brought SBP down to 160s for 30 minutes and now back to 190s systolic. Beth [name] is at bedside, knows the patient well, states patient had stroke in the past with left sided deficit that has recovered. She did not have MI in the past. She is on multiple antihypertensives and takes all her medications. Beth [name] states her BP is always elevated. She is urinating well without issue. She has been c/o SOB for 2-3 weeks, not chest pain, but started to mention some right sided chest pain today upon arrival to [facility]. She had multiple episode of nausea and emesis so far. History is limited from the patient as she is actively vomiting and distressed with SOB.   At [facility],  diagnostic from 8/23 revealing creatinine 3.31, BUN 65.  proBNP was 1649.  High sensitive troponin [id number].  Imaging during CT head, x-ray, CT abdomen and pelvis did not reveal any acute finding.  She was started on antibiotic for left otitis externa.  She is now transferred here for AKI on CKD.  Cardiology is consulted for elevated troponin.   Labs here showed hyponatremia 134, potassium 3.2, chloride 97, glucose 149, BUN 53, creatinine 2.66, total bilirubin 1.3, GFR 18.  CRP <0.5.  CBC diff unremarkable.  Hemoglobin A1c 6.5%.  TSH WNL, respiratory viral panel negative.  Blood culture x 2 sent.  High sensitive troponin 2146 x 1.  Chest x-ray repeated here today per radiology report revealed hypoventilation and no acute process.  ABG pending.  Echocardiogram and CT head pending.  She is maintained heparin  drip.  Cardiology is consulted for further evaluation of non-STEMI.  Historically, she was referred to Dr. Lavona initially for cardiac murmur.  Echo from 02/25/2016 with LVEF 50 to 55%, mild LVH, mild MR, moderate TR, PA peak pressure 39 mmHg.  She was concerned for acute onset of heart failure with preserved EF 07/2023 during hospitalization at [facility] health care.  Echocardiogram 07/09/2023 at [facility] health care showed LVEF >55%, grade I DD, mild MR, mod LAE, normal RV.  She required IV Lasix  for diuresis and discharged on p.o. Lasix  20 mg daily and started on Jardiance  10 mg daily.  She did have elevated troponin in 200s at the time which was felt to demand ischemia.  She was last seen by Dr. Lavona on 02/17/2024 in the office,  was doing fairly well, reported mobility issue due to frequent knee surgeries in the past, no cardiac symptoms suggest angina or CHF exacerbation.  She does have chronic bilateral lower extremity edema.  No cardiac murmur was appreciated on exam.  She was maintained on medical therapy with Lipitor  20 mg daily, diltiazem  CD2 140 mg daily, Jardiance  10 mg daily, Lasix  20 mg daily,  hydralazine  25 mg 3 times daily, Imdur  10 mg 3 times daily, nebivolol  20 mg daily, and olmesartan  40 mg daily.     Past Medical History:  Diagnosis Date   Anemia    Arthritis    Knee both knees   Blood transfusion without reported diagnosis 2012   anemia;pt denies transfusion stated was only on iron  tablet   Cataract    left   CKD (chronic kidney disease), stage III (HCC)    Diabetes mellitus without complication (HCC)    Family history of anesthesia complication    sister very slow to awaken after anesthesia;severe vomiting    Gout    left elbow   Herpes infection 08/09/2014   Saw doctor Wed. 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   Other acute osteomyelitis, right femur (HCC) 07/23/2020   Pseudomonas aeruginosa infection 12/15/2018   Pseudomonas aeruginosa infection 12/15/2018   Septic arthritis of knee, right (HCC) 08/27/2020   Stroke (HCC) 2006   x 1 no deficits noted     Past Surgical History:  Procedure Laterality Date   ABDOMINAL HYSTERECTOMY  1983   CARPAL TUNNEL RELEASE Right 1983   colonscopy  June 21, 2014   EXCISIONAL TOTAL KNEE ARTHROPLASTY WITH ANTIBIOTIC SPACERS Right 02/16/2015   Procedure: RIGHT KNEE RESECTION ARTHROPLASTY WITH ANTIBIOTIC SPACERS;  Surgeon: Dempsey Moan, MD;  Location: [facility];  Service: Orthopedics;  Laterality: Right;   EXCISIONAL TOTAL KNEE ARTHROPLASTY WITH ANTIBIOTIC SPACERS Right 11/03/2018   Procedure: Right knee resection arthroplasty; antibiotic spacer;  Surgeon: Moan Dempsey, MD;  Location: [facility];  Service: Orthopedics;  Laterality: Right;  Adductor Block   I & D KNEE WITH POLY EXCHANGE Right 10/02/2014   Procedure: IRRIGATION AND DEBRIDEMENT RIGHT KNEE WITH POLY EXCHANGE;  Surgeon: Dempsey Moan GAILS, MD;   Location: [facility];  Service: Orthopedics;  Laterality: Right;   I & D KNEE WITH POLY EXCHANGE Right 08/27/2020   Procedure: IRRIGATION AND DEBRIDEMENT; SPACER EXCHANGE RIGHT KNEE with multiple specimens;  Surgeon: Moan Dempsey, MD;  Location: [facility];  Service: Orthopedics;  Laterality: Right;    INCISION AND DRAINAGE OF WOUND Right 01/14/2017   Procedure: IRRIGATION AND DEBRIDEMENT WOUND;  Surgeon: Dempsey Moan, MD;  Location: [facility];  Service: Orthopedics;  Laterality: Right;  requests   JOINT REPLACEMENT  06/2014   right knee   nasal cauterization  2012   PATELLAR TENDON REPAIR Right 08/11/2014   Procedure: RIGHT PATELLA TENDON REPAIR;  Surgeon: Dempsey Moan GAILS, MD;  Location: [facility];  Service: Orthopedics;  Laterality: Right;   REIMPLANTATION OF TOTAL KNEE Right 05/23/2015   Procedure: RIGHT KNEE ARTHROPLASTY REIMPLANTATION;  Surgeon: Dempsey Moan, MD;  Location: [facility];  Service: Orthopedics;  Laterality: Right;   TOTAL KNEE ARTHROPLASTY Right 07/03/2014   Procedure: RIGHT TOTAL KNEE ARTHROPLASTY;  Surgeon: Dempsey Moan GAILS, MD;  Location: [facility];  Service: Orthopedics;  Laterality: Right;   TUBAL LIGATION       Home Medications:  Prior to Admission medications   Medication Sig Start Date End Date Taking? Authorizing Provider  acetaminophen  (TYLENOL ) 500 MG tablet Take 2 tablets (1,000 mg total) by mouth every 6 (six) hours as needed for mild pain. 09/28/18  Yes Stacks, Butler, MD  atorvastatin  (LIPITOR ) 20 MG tablet TAKE 1 TABLET BY MOUTH EVERY EVENING 05/16/24  Yes Stacks, Butler, MD  azelastine  (ASTELIN ) 0.1 % nasal spray Place 1 spray into both nostrils 2 (two) times daily. Use in each nostril as directed 07/26/24  Yes St Morton Hummer, Nena, NP  azithromycin  (ZITHROMAX ) 250 MG tablet Take 2 tablets on day 1, then 1 tablet daily on days 2 through 5 07/27/24 08/01/24 Yes St Morton Hummer, Nena, NP  colchicine  0.6 MG tablet Take twice daily for gout attack. (may take every two  hours up to 6 doses at acute onset) 03/03/24  Yes Stacks, Butler, MD  diltiazem  (CARDIZEM  CD) 240 MG 24 hr capsule TAKE ONE CAPSULE BY MOUTH DAILY 05/16/24  Yes Zollie Butler, MD  furosemide  (LASIX ) 20 MG tablet Take 20 mg by mouth daily. 02/12/24  Yes [provider]  hydrALAZINE  (APRESOLINE ) 25 MG tablet TAKE ONE (1) TABLET BY MOUTH 3 TIMES DAILY Patient taking differently: Take 100 mg by mouth 3 (three) times daily. 07/18/24  Yes Stacks, Butler, MD  hydrochlorothiazide  (HYDRODIURIL ) 25 MG tablet Take 25 mg by mouth daily. 07/20/24 08/19/24 Yes [provider]  isosorbide  dinitrate (ISORDIL ) 10 MG tablet TAKE ONE (1) TABLET BY MOUTH 3 TIMES DAILY 10/05/23  Yes Zollie Butler, MD  Nebivolol  HCl 20 MG TABS TAKE ONE (1) TABLET EACH DAY 03/03/24  Yes Stacks, Butler, MD  NEOMYCIN -POLYMYXIN-HYDROCORTISONE (CORTISPORIN) 1 % SOLN OTIC solution Place 3 drops into the left ear every 4 (four) hours.   Yes [provider]  olmesartan  (BENICAR ) 40 MG tablet TAKE ONE (1) TABLET EACH DAY Patient taking differently: Take 40 mg by mouth daily. 07/18/24  Yes Zollie Butler, MD  ondansetron  (ZOFRAN ) 4 MG tablet Take 1 tablet (4 mg total) by mouth every 8 (eight) hours as needed for nausea or vomiting. 07/25/24  Yes St Morton Hummer, Nena, NP  alendronate  (FOSAMAX ) 70 MG tablet TAKE 1 TABLET WEEKLY (TAKE WITH 8OZ OF WATER  30 MINUTES BEFORE BREAKFAST) Patient not taking: Reported on 08/01/2024 05/16/24   Zollie Butler, MD  empagliflozin  (JARDIANCE ) 10 MG TABS tablet Take 1 tablet (10 mg total) by mouth daily. Patient not taking: Reported on 07/25/2024 09/29/23   Zollie Butler, MD  ferrous sulfate  325 (65 FE) MG tablet Take 325 mg by mouth 2 (two) times a week. Patient not taking: Reported on 08/01/2024    [provider]    Scheduled Meds:  atorvastatin   20 mg Oral QPM   ciprofloxacin -dexamethasone   4 drop Left EAR BID   diltiazem   240 mg Oral QPM   furosemide   40 mg Intravenous Daily    hydrALAZINE   100 mg Oral TID   nystatin   5 mL Oral QID   sodium chloride  flush  3 mL Intravenous Q12H   Continuous Infusions:  sodium chloride  10 mL/hr at 08/01/24 1609   heparin  1,100 Units/hr (08/01/24 1616)   nitroGLYCERIN      piperacillin -tazobactam 3.375 g (08/01/24 1016)   potassium chloride  10 mEq (08/01/24 1559)   PRN Meds: sodium chloride , acetaminophen  **OR** acetaminophen , albuterol , dextrose , hydrALAZINE , trimethobenzamide   Allergies:    Allergies  Allergen Reactions  Cefdinir Swelling and Rash   Aleve [Naproxen Sodium] Other (See Comments)    Heart races   Dorethia [Aspirin ] Other (See Comments)    Nose bleeding   Ciprofloxacin  Other (See Comments)    Possible hamstring tendinopathy   Clonidine  Derivatives Other (See Comments)    Dizziness and weakness   Shellfish Allergy Nausea And Vomiting    Social History:   Social History   Socioeconomic History   Marital status: Widowed    Spouse name: Not on file   Number of children: 9   Years of education: 8th   Highest education level: 8th grade  Occupational History    Employer: RETIRED  Tobacco Use   Smoking status: Never   Smokeless tobacco: Never  Vaping Use   Vaping status: Never Used  Substance and Sexual Activity   Alcohol use: No   Drug use: No   Sexual activity: Not Currently  Other Topics Concern   Not on file  Social History Narrative   She has 6 living adult children and 3 who have passed away and many grandchildren and great grandchildren. Patient is retired. She worked part time Education officer, environmental houses and a Research officer, trade union.    One child in [place], others in [place], [place], [place], and [place]   Social Drivers of Health   Financial Resource Strain: Low Risk  (07/20/2024)   Received from [facility]   Overall Financial Resource Strain (CARDIA)    How hard is it for you to pay for the very basics like food, housing, medical care, and heating?: Not hard at all  Food Insecurity: No Food Insecurity  (08/01/2024)   Hunger Vital Sign    Worried About Running Out of Food in the Last Year: Never true    Ran Out of Food in the Last Year: Never true  Transportation Needs: No Transportation Needs (08/01/2024)   PRAPARE - Administrator, Civil Service (Medical): No    Lack of Transportation (Non-Medical): No  Recent Concern: Transportation Needs - Unmet Transportation Needs (07/20/2024)   Received from [facility] - Transportation    Lack of Transportation (Medical): Yes    Lack of Transportation (Non-Medical): Yes  Physical Activity: Inactive (07/20/2024)   Received from [facility]   Exercise Vital Sign    On average, how many days per week do you engage in moderate to strenuous exercise (like a brisk walk)?: 0 days    On average, how many minutes do you engage in exercise at this level?: 0 min  Stress: No Stress Concern Present (07/20/2024)   Received from [facility] - Occupational Stress Questionnaire    Do you feel stress - tense, restless, nervous, or anxious, or unable to sleep at night because your mind is troubled all the time - these days?: Not at all  Social Connections: Moderately Integrated (08/01/2024)   Social Connection and Isolation Panel    Frequency of Communication with Friends and Family: More than three times a week    Frequency of Social Gatherings with Friends and Family: Three times a week    Attends Religious Services: More than 4 times per year    Active Member of Clubs or Organizations: Yes    Attends Banker Meetings: More than 4 times per year    Marital Status: Never married  Recent Concern: Social Connections - Socially Isolated (07/20/2024)   Received from [facility]  Social Connection and Isolation Panel    In a typical week, how many times do you talk on the phone with family, friends, or neighbors?: More than three times a week    How often do you get together  with friends or relatives?: Once a week    How often do you attend church or religious services?: Never    Do you belong to any clubs or organizations such as church groups, unions, fraternal or athletic groups, or school groups?: No    How often do you attend meetings of the clubs or organizations you belong to?: Never    Are you married, widowed, divorced, separated, never married, or living with a partner?: Never married  Intimate Partner Violence: Not At Risk (08/01/2024)   Humiliation, Afraid, Rape, and Kick questionnaire    Fear of Current or Ex-Partner: No    Emotionally Abused: No    Physically Abused: No    Sexually Abused: No    Family History:    Family History  Problem Relation Age of Onset   Ovarian cancer Mother    Cancer Mother    Peripheral vascular disease Father        with amputation of both legs   Hypertension Father    Heart disease Brother 16   Kidney disease Beth [name]    Heart disease Beth [name] 60   Diabetes Son    Diabetes Son    Colon cancer Neg Hx    Esophageal cancer Neg Hx    Stomach cancer Neg Hx    Rectal cancer Neg Hx      ROS:  Unable to complete ROS as patient is in distress, complaining chest pain, shortness of breath, left ear pain, weakness, fatigue, headache, sore throat, hypertension  Physical Exam/Data: Vitals:   08/01/24 1400 08/01/24 1510 08/01/24 1515 08/01/24 1600  BP:  (!) 173/95 (!) 167/95 (!) 181/96  Pulse: (!) 51 (!) 52 (!) 53 (!) 58  Resp: 12 15 16 15   Temp:      TempSrc:      SpO2: 99% 100% 100% 100%    Intake/Output Summary (Last 24 hours) at 08/01/2024 1625 Last data filed at 08/01/2024 1118 Gross per 24 hour  Intake 120 ml  Output --  Net 120 ml      07/25/2024    2:36 PM 03/03/2024    3:37 PM 12/30/2023   12:11 PM  Last 3 Weights  Weight (lbs) 231 lb -- 228 lb  Weight (kg) 104.781 kg -- 103.42 kg     There is no height or weight on file to calculate BMI.   Vitals:  Vitals:   08/01/24 1515 08/01/24 1600   BP: (!) 167/95 (!) 181/96  Pulse: (!) 53 (!) 58  Resp: 16 15  Temp:    SpO2: 100% 100%   General Appearance: In no acute distress, sitting in bed, actively vomiting HEENT: Normocephalic, atraumatic.  Eyes remains closed while talking  neck: No significant JVD Cardiovascular: Regular rate and rhythm, normal S1-S2, no significant murmur noted Respiratory: Subjective dyspnea at rest, lung sounds with bilatera crackles and rhonchi, on 3 L nasal cannula oxygen, pulse ox of 100% Gastrointestinal: Bowel sounds positive, abdomen soft Extremities: Able to move all extremities in bed without difficulty, bilateral lower extremity chronic edema with skin change noted  Musculoskeletal: Normal muscle bulk and tone Skin: Intact, warm, dry.  Neurologic: Alert, oriented to person and place, limited ability to elaborate history due to dyspnea and active vomiting, no focal  neurodeficit at this time, possible mild confusion psychiatric: Calm     EKG:  The EKG was personally reviewed and demonstrates:    EKG today showed sinus bradycardia 51 bpm with sinus arrhythmia, PACs, first-degree AV block, manual QT 479 msec via Bazett   Telemetry:  Telemetry was personally reviewed and demonstrates:    Sinus bradycardia 40-50s, PACs, PVCs, first-degree AV block, no pause >2.17s   Relevant CV Studies:  Echocardiogram is pending today   Echo 07/09/23:    1. The left ventricle is normal in size with mildly to moderately increased  wall thickness.    2. The left ventricular systolic function is normal, LVEF is visually  estimated at > 55%.    3. There is grade I diastolic dysfunction (impaired relaxation).    4. There is mild mitral valve regurgitation.    5. The left atrium is moderately dilated in size.    6. The right ventricle is normal in size, with normal systolic function.    7. The right atrium is mildly dilated in size.    Echo 02/29/16:  - Left ventricle: abnormal septal motion. The cavity  size was    mildly dilated. Wall thickness was increased in a pattern of mild    LVH. Systolic function was normal. The estimated ejection    fraction was in the range of 50% to 55%.  - Mitral valve: There was mild regurgitation.  - Left atrium: The atrium was mildly dilated.  - Tricuspid valve: There was moderate regurgitation.  - Pulmonary arteries: PA peak pressure: 39 mm Hg (S).  - Impressions: Asked to read by primary service in Dr harwani&'s que    from yesterday.     Laboratory Data: High Sensitivity Troponin:   Recent Labs  Lab 08/01/24 1214  TROPONINIHS 2,146*     Chemistry Recent Labs  Lab 08/01/24 0924  NA 134*  K 3.2*  CL 97*  CO2 22  GLUCOSE 149*  BUN 53*  CREATININE 2.66*  CALCIUM  8.5*  MG 2.3  GFRNONAA 18*  ANIONGAP 15    Recent Labs  Lab 08/01/24 0924  PROT 6.3*  ALBUMIN 2.9*  AST 17  ALT 11  ALKPHOS 76  BILITOT 1.3*   Lipids No results for input(s): CHOL, TRIG, HDL, LABVLDL, LDLCALC, CHOLHDL in the last 168 hours.  Hematology Recent Labs  Lab 08/01/24 0924  WBC 7.7  RBC 4.87  HGB 12.8  HCT 40.3  MCV 82.8  MCH 26.3  MCHC 31.8  RDW 14.9  PLT 269   Thyroid   Recent Labs  Lab 08/01/24 0924  TSH 0.472    BNPNo results for input(s): BNP, PROBNP in the last 168 hours.  DDimer No results for input(s): DDIMER in the last 168 hours.  Radiology/Studies:  DG CHEST PORT 1 VIEW Result Date: 08/01/2024 CLINICAL DATA:  Shortness of breath EXAM: PORTABLE CHEST 1 VIEW COMPARISON:  03/31/2014 FINDINGS: Low lung volumes accentuate cardiomediastinal silhouette and pulmonary vascularity. No focal consolidation, pleural effusion, or pneumothorax. No displaced rib fractures. IMPRESSION: Hypoventilation.  No acute cardiopulmonary process. Electronically Signed   By: Norman Gatlin M.D.   On: 08/01/2024 14:26     Assessment and Plan:  Non-STEMI Hypertensive emergency Acute on chronic diastolic heart failure - presented to [facility] with numerous complaints for c/o generalized malaise, feeling sick for 2 weeks, headache, throat pain, nausea with emesis, weakness, elevated blood pressure, ear pain with drainage; now c/o chest pain started today and SOB for 2-3 weeks  -  Remote stress Myoview 02/01/2010 which was negative for ischemia - Last echo from [facility] 07/09/2023 showed LVEF > 55%, grade 1 DD, mild MR, moderate LAE, mild RAE, normal RV - Elevated troponin 300s at [facility], high sensitive troponin 2146 x1 here, will trend, workup also revealed AKI on CKD stage IIIb with Cr 2.66 today , hypokalemia, hypoalbuminemia, otitis externa - Echocardiogram is pending today, will check Lipid panel, Hgb A1C 6.5% - Etiology unclear, possible demand ischemia from hypertensive emergency+ AKI versus type I NSTEMI - Will initiate nitroglycerin  gtt for uncontrolled hypertension, start IV Lasix  40mg  daily,  hold PTA Imdur  10mg  TID for now, hold PTA hydrochlorothiazide , olmesartan  and jardiance  due to AKI, hold PTA nebivolol  due to bradycardia, continue hydralazine  100 mg TID and diltiazem  CD 240 mg daily - Chest x-ray repeated today concerning for pulmonary edema, will initiate IV Lasix  40mg  daily (PTA Lasix  20 mg daily) - If renal function has reasonable recover and blood pressure controlled, may consider cardiac catheterization for ischemic workup this admission  - Okay to continue heparin  drip, add nitroglycerin  drip as above, start aspirin  81 mg daily, increase Lipitor  to 80 mg daily, no BB given bradycardia so far - Consider ICU transfer, deferred to primary team  Mild mitral regurgitation -Repeat echo is pending  AKI on CKD IIIb Acute metabolic encephalopathy Acute otitis externa, left Hypokalemia Type 2 diabetes -Per primary team   Risk Assessment/Risk Scores:  TIMI Risk Score for Unstable Angina or Non-ST Elevation MI:   The patient's TIMI risk score is 3, which indicates a 13% risk of all cause mortality, new or recurrent  myocardial infarction or need for urgent revascularization in the next 14 days.  New [place]  Heart Association (NYHA) Functional Class NYHA Class IV       For questions or updates, please contact [facility] Please consult [email or web address] for contact info under    Signed, Xika Zhao, NP  08/01/2024 4:25 PM  ATTENDING ATTESTATION:  After conducting a review of all available clinical information with the care team, interviewing the patient, and performing a physical exam, I agree with the findings and plan described in this note.   GEN: Sleeping HEENT:  MMM, no JVD, no scleral icterus Cardiac: RRR, 2 out of 6 systolic murmur  respiratory: Clear to auscultation bilaterally. GI: Soft, nontender, non-distended  MS: Trace edema; No deformity.  Warm and well-perfused Neuro:  Nonfocal  Vasc:  +2 radial pulses  The patient is a 78 year old female with a history of chronic diastolic heart failure, hypertension, CKD stage IIIb, type 2 diabetes, chronic lower extremity edema, who is very sedentary who is admitted with failure to thrive with generalized malaise and feeling ill for the last 2 weeks.  Her evaluation at [facility] demonstrated an elevated proBNP and mildly elevated troponins in the 300s.  Her course has been complicated by development of acute kidney injury.  Earlier today she became agitated and nauseous.  Her blood pressure was in the 200s.  She received IV hydralazine  and nausea medications.  Currently she is sleeping and her blood pressure is at a normal range.  I spoke with the patient's Beth [name] at bedside.  Again she tells me she is quite sedentary due to multiple surgeries on her right knee.  Her echocardiogram demonstrates preserved LV function with grade 1 diastolic dysfunction with no significant valve abnormalities.  For now I think the best strategy is to continue with aggressive blood pressure and volume control.  We will monitor her creatinine.  She may  require coronary angiography in the future.  I did also broach the subject of medical therapy only without coronary angiography given her sedentary status and comorbidities.  The patient's Beth [name] was open to this possibility.  We will reassess during her hospitalization about whether an invasive assessment or medical therapy should be pursued.  For now continue IV Lasix  as informed by the patient's creatinine and aggressive blood pressure control.  Will defer IV nitroglycerin  drip at this point in time as patient's blood pressure is well-controlled.  Continue hydralazine  100 mg 3 times daily, Cardizem  240 mg daily  Lurena Red, MD Pager [phone]

## 2024-08-01 NOTE — Progress Notes (Signed)
 Echocardiogram 2D Echocardiogram has been performed.  Beth [name] 08/01/2024, 5:12 PM

## 2024-08-02 ENCOUNTER — Encounter (HOSPITAL_COMMUNITY): Payer: Self-pay | Admitting: Internal Medicine

## 2024-08-02 ENCOUNTER — Other Ambulatory Visit (HOSPITAL_COMMUNITY): Payer: Self-pay

## 2024-08-02 ENCOUNTER — Other Ambulatory Visit: Payer: Self-pay

## 2024-08-02 ENCOUNTER — Encounter: Payer: Self-pay | Admitting: Hematology and Oncology

## 2024-08-02 DIAGNOSIS — I161 Hypertensive emergency: Secondary | ICD-10-CM | POA: Diagnosis not present

## 2024-08-02 DIAGNOSIS — I160 Hypertensive urgency: Secondary | ICD-10-CM | POA: Diagnosis not present

## 2024-08-02 DIAGNOSIS — I214 Non-ST elevation (NSTEMI) myocardial infarction: Secondary | ICD-10-CM | POA: Diagnosis not present

## 2024-08-02 DIAGNOSIS — N179 Acute kidney failure, unspecified: Secondary | ICD-10-CM | POA: Diagnosis not present

## 2024-08-02 DIAGNOSIS — G9341 Metabolic encephalopathy: Secondary | ICD-10-CM | POA: Diagnosis not present

## 2024-08-02 DIAGNOSIS — R7989 Other specified abnormal findings of blood chemistry: Secondary | ICD-10-CM | POA: Diagnosis not present

## 2024-08-02 LAB — CBC
HCT: 43.8 % (ref 36.0–46.0)
Hemoglobin: 14 g/dL (ref 12.0–15.0)
MCH: 26.5 pg (ref 26.0–34.0)
MCHC: 32 g/dL (ref 30.0–36.0)
MCV: 83 fL (ref 80.0–100.0)
Platelets: 318 K/uL (ref 150–400)
RBC: 5.28 MIL/uL — ABNORMAL HIGH (ref 3.87–5.11)
RDW: 15.1 % (ref 11.5–15.5)
WBC: 10.4 K/uL (ref 4.0–10.5)
nRBC: 0 % (ref 0.0–0.2)

## 2024-08-02 LAB — RESPIRATORY PANEL BY PCR

## 2024-08-02 LAB — BASIC METABOLIC PANEL WITH GFR
Anion gap: 18 — ABNORMAL HIGH (ref 5–15)
BUN: 48 mg/dL — ABNORMAL HIGH (ref 8–23)
CO2: 22 mmol/L (ref 22–32)
Calcium: 8.9 mg/dL (ref 8.9–10.3)
Chloride: 96 mmol/L — ABNORMAL LOW (ref 98–111)
Creatinine, Ser: 2.76 mg/dL — ABNORMAL HIGH (ref 0.44–1.00)
GFR, Estimated: 17 mL/min — ABNORMAL LOW (ref >60)
Glucose, Bld: 144 mg/dL — ABNORMAL HIGH (ref 70–99)
Potassium: 3.3 mmol/L — ABNORMAL LOW (ref 3.5–5.1)
Sodium: 136 mmol/L (ref 135–145)

## 2024-08-02 LAB — UREA NITROGEN, URINE: Urea Nitrogen, Ur: 378 mg/dL

## 2024-08-02 LAB — HEPARIN LEVEL (UNFRACTIONATED)
Heparin Unfractionated: >1.1 [IU]/mL — ABNORMAL HIGH (ref 0.30–0.70)
Heparin Unfractionated: >1.1 [IU]/mL — ABNORMAL HIGH (ref 0.30–0.70)

## 2024-08-02 LAB — MAGNESIUM: Magnesium: 2.4 mg/dL (ref 1.7–2.4)

## 2024-08-02 LAB — GLUCOSE, CAPILLARY
Glucose-Capillary: 143 mg/dL — ABNORMAL HIGH (ref 70–99)
Glucose-Capillary: 153 mg/dL — ABNORMAL HIGH (ref 70–99)
Glucose-Capillary: 142 mg/dL — ABNORMAL HIGH (ref 70–99)
Glucose-Capillary: 144 mg/dL — ABNORMAL HIGH (ref 70–99)

## 2024-08-02 MED ORDER — SODIUM CHLORIDE 0.9 % IV SOLN: INTRAVENOUS | Status: AC

## 2024-08-02 MED ORDER — CLOPIDOGREL BISULFATE 75 MG PO TABS: 75.0000 mg | ORAL_TABLET | Freq: Every day | ORAL | Status: DC

## 2024-08-02 MED ORDER — HEPARIN SODIUM (PORCINE) 5000 UNIT/ML IJ SOLN
5000.0000 [IU] | Freq: Three times a day (TID) | INTRAMUSCULAR | Status: DC
  Administered 2024-08-02 – 2024-08-07 (×15): 5000 [IU] via SUBCUTANEOUS
  Filled 2024-08-02 (×15): qty 1

## 2024-08-02 MED ORDER — HEPARIN (PORCINE) 25000 UT/250ML-% IV SOLN
950.0000 [IU]/h | INTRAVENOUS | Status: DC
  Administered 2024-08-02: 950 [IU]/h via INTRAVENOUS

## 2024-08-02 NOTE — Plan of Care (Signed)

## 2024-08-02 NOTE — Progress Notes (Addendum)
 Progress Note  Patient Name: Beth [name] Date of Encounter: 08/02/2024 [facility] Cardiologist: None   Interval Summary    Laying in bed, complaints of pain but unable to say where. Granddaughter at the bedside.   Vital Signs Vitals:   08/02/24 0700 08/02/24 0900 08/02/24 0957 08/02/24 1002  BP: (!) 172/73 (!) 150/88  (!) 167/87  Pulse: (!) 52 (!) 55 (!) 56 (!) 56  Resp: 19     Temp: 98.6 F (37 C)     TempSrc: Axillary     SpO2: 100%  98%   Weight:      Height:        Intake/Output Summary (Last 24 hours) at 08/02/2024 1055 Last data filed at 08/02/2024 0653 Gross per 24 hour  Intake 798.02 ml  Output 1500 ml  Net -701.98 ml      08/02/2024    4:24 AM 07/25/2024    2:36 PM 03/03/2024    3:37 PM  Last 3 Weights  Weight (lbs) 216 lb 14.9 oz 231 lb --  Weight (kg) 98.4 kg 104.781 kg --      Telemetry/ECG   Sinus Bradycardia - Personally Reviewed  Physical Exam  GEN: No acute distress.  On RA Neck: No JVD Cardiac: RRR, no murmurs, rubs, or gallops.  Respiratory: Diminished throughout GI: Soft, nontender, non-distended  MS: No edema  Assessment & Plan   78 y.o. female with a hx of mitral regurgitation, hypertension, CKD stage IIIb, chronic diastolic heart failure, first-degree AV block, dyslipidemia, type 2 diabetes, chronic lower extremity edema, CVA, who was seen 08/01/2024 for the evaluation of non-STEMI at the request of Dr. Claudene.   NSTEMI -- presented with generalized fatigue, headache, throat pain and nausea and emesis for 2 weeks. -- Found to have elevated troponins in the 300s at [facility] with repeat high-sensitivity troponin here 2146>>2286>>2066>>2037 -- She denies any chest pain, just reports generalized pain this morning. -- unclear etiology though could be demand ischemia in the setting of hypertensive emergency and AKI --Echo 8/25 with LVEF of 60 to 65%, grade 1 diastolic dysfunction, normal RV, no significant valvular disease -- Given her  comorbidities, renal function and age would favor treating her medically without invasive angiography for the time being -- will stop IV heparin , continue ASA,   Hypertensive emergency -- blood pressures elevated this morning, though also complaining of generalized pain -- continue Diltiazem  240mg  daily, hydralazine  100mg  TID. Home hydrochlorothiazide /ARB held with renal dysfunction  -- given IV lasix  40mg  daily on admission, 1.5L UOP overnight. Will hold additional lasix  for now with rising Cr.  Per Primary AKI on CKD IIIb Acute metabolic encephalopathy Acute otitis externa, left Hypokalemia Type 2 diabetes  For questions or updates, please contact  [facility] Please consult [email or web address] for contact info under       Signed, Manuelita Rummer, NP    ATTENDING ATTESTATION:  After conducting a review of all available clinical information with the care team, interviewing the patient, and performing a physical exam, I agree with the findings and plan described in this note.   GEN: Somnolent HEENT:  MMM, no JVD, no scleral icterus Cardiac: RRR, no murmurs, rubs, or gallops.  Respiratory: Clear to auscultation bilaterally. GI: Soft, nontender, non-distended  MS: No edema; No deformity. Neuro:  Nonfocal  Vasc:  +2 radial pulses  Patient relatively unchanged versus yesterday.  She is drowsy today and having unspecified pain.  Her blood pressure is somewhat elevated as well.  Creatinine is  mildly elevated so we will hold further Lasix .  She seems euvolemic on exam.  I did discuss with her family potentially treating her with aspirin  and Plavix  for medical treatment of her acute coronary syndrome.  She falls not infrequently.  For this reason we will treat her with aspirin  81 mg only.  Given her multiple comorbidities and  very low functional capacity, medical therapy for her acute coronary syndrome will be pursued; we will defer any invasive procedures.  Her echocardiogram  demonstrates preserved LV function with diastolic dysfunction.    Lurena Red, MD Pager [phone]

## 2024-08-02 NOTE — Progress Notes (Signed)
 PHARMACY - ANTICOAGULATION CONSULT NOTE  Pharmacy Consult for Heparin  Indication: CP, elevated troponin  Allergies  Allergen Reactions   Cefdinir Swelling and Rash   Aleve [Naproxen Sodium] Other (See Comments)    Heart races   Dorethia Morale ] Other (See Comments)    Nose bleeding   Ciprofloxacin  Other (See Comments)    Possible hamstring tendinopathy   Clonidine  Derivatives Other (See Comments)    Dizziness and weakness   Shellfish Allergy Nausea And Vomiting   Vital Signs: Temp: 98.6 F (37 C) (08/26 0700) Temp Source: Axillary (08/26 0700) BP: 172/73 (08/26 0700) Pulse Rate: 52 (08/26 0700) Heparin  dosing weight = 80 kg  Labs: Recent Labs    08/01/24 0924 08/01/24 1214 08/01/24 1606 08/01/24 1839 08/01/24 2225 08/02/24 0057 08/02/24 0817  HGB 12.8  --   --   --   --  14.0  --   HCT 40.3  --   --   --   --  43.8  --   PLT 269  --   --   --   --  318  --   HEPARINUNFRC  --   --   --   --   --  >1.10* >1.10*  CREATININE 2.66*  --   --   --   --  2.76*  --   TROPONINIHS  --    < > 2,286* 2,066* 2,037*  --   --    < > = values in this interval not displayed.    Estimated Creatinine Clearance: 18.9 mL/min (A) (by C-G formula based on SCr of 2.76 mg/dL (H)).   Medical History: Past Medical History:  Diagnosis Date   Anemia    Arthritis    Knee both knees   Blood transfusion without reported diagnosis 2012   anemia;pt denies transfusion stated was only on iron  tablet   Cataract    left   CKD (chronic kidney disease), stage III (HCC)    Diabetes mellitus without complication (HCC)    Family history of anesthesia complication    sister very slow to awaken after anesthesia;severe vomiting    Gout    left elbow   Herpes infection 08/09/2014   Saw doctor Wed. 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    Initial heparin  level > 1.1 on 1100 units/hr.  Confirmed level drawn from opposite arm.  CBC stable.  No s/sx bleeding per RN.  Goal of Therapy:  Heparin  level 0.3-0.7 units/ml Monitor platelets by anticoagulation protocol: Yes   Plan:  HOLD heparin  infusion x1h Restart heparin  IV 950 units/hr @1030  8h heparin  level  Maurilio Fila, PharmD Clinical Pharmacist 08/02/2024  9:00 AM

## 2024-08-02 NOTE — Progress Notes (Signed)
 Progress Note   Patient: Beth [name] FMW:[id number] DOB: [birth year] DOA: 08/01/2024     1 DOS: the patient was seen and examined on 08/02/2024   Brief hospital course: Beth [name] is a 78 y.o. female with medical history significant of hypertension, hyperlipidemia, diastolic CHF, CVA, diabetes mellitus type 2, and chronic kidney disease stage IIIb presents with elevated blood pressure, earache, throat ache, headache, nausea, and weakness for further evaluation.  Outside facility labs from 8/23 noted WBC 9.1, hemoglobin 13.5, sodium 133, potassium 3.3, chloride 95, BUN 65, creatinine 3.31, proBNP 1649,  high-sensitivity troponin 374-> 358-> 350,  and delta at  hsTrop 16->8.  CT scan of the head showed no acute intercranial abnormality with opacification of the right maxillary sinus that was new.  Chest x-ray noted mild cardiomegaly with left ventricular prominence with lungs otherwise noted to be clear.  Urinalysis was significant for protein and glucose, but no signs for infection.  CT scan of the abdomen pelvis was also done that did not show any signs of hydronephrosis or ureteral calculus. Workup notable for left ear otitis externa started on clindamycin and Rocephin  admitted to TRH service with cardiology evaluation for elevated troponin.  Assessment and Plan: Elevated troponin Acute.  Patient presented with complaints of generalized malaise and substernal chest pain..  Found to have high-sensitivity troponins at the outside facility374-> 358-> 350  and delta at  hsTrop 16->8. High-sensitivity troponin elevated> 2300 Cardiology evaluation appreciated. No further cardiac intervention due to her kidney dysfunction, poor functional level of activity. Heparin  drip stopped. Medical therapy with Aspirin  + plavix  started. Echocardiogram pending.   Hypertensive urgency Held beta-blocker due to bradycardia Continue hydralazine  and Cardizem  as tolerated Hydralazine  IV as needed for elevated blood  pressure   Acute metabolic encephalopathy Patient noted to be more lethargic and altered.  No focal deficits appreciated on physical exam. Continue Neurochecks. CT scan of the head unremarkable.   Heart failure with preserved ejection fraction Cardiology advised to stop diuresis as she is euvolemic. Strict I&O's and daily weights Follow-up echocardiogram   Acute kidney injury superimposed on chronic kidney disease Patient presented to the outside facility with creatinine elevated up to 3.31 at the outside facility.  Repeat check here noted to be 2.66 with BUN 53.  Baseline creatinine previously noted to be around 1.5- 2. Very gentle IV fluids for now. Monitor daily renal function.   Otitis externa At the outside facility patient had been given clindamycin and Rocephin . Ciprofloxacin  eardrops twice daily Zosyn  IV in the acute setting as unable to give Cipro  due to prolonged QT interval   Sore throat  Aspiration precautions with elevation head of the bed Nystatin  swish and swallow ordered. RVP 20 path ordered.   Prolonged QT interval QT prolonged at 508. Correct electrolyte abnormalities Avoid further QT prolonging medications   Controlled diabetes mellitus type 2, without long-term use of insulin  Hemoglobin A1c 6.5 Accuchecks, sliding scale per protocol   Debility Right knee injury Patient has difficulty with ambulation due to prior right prosthetic knee infection requiring resection of arthroplasty and placement of antibiotic spacer. PT/ OT on board.     Out of bed to chair. Incentive spirometry. Nursing supportive care. Fall, aspiration precautions. Diet:  Diet Orders (From admission, onward)     Start     Ordered   08/01/24 0727  Diet heart healthy/carb modified Room service appropriate? Yes; Fluid consistency: Thin  Diet effective now       Question Answer Comment  Diet-HS  Snack? Nothing   Room service appropriate? Yes   Fluid consistency: Thin       08/01/24 0728           DVT prophylaxis: heparin  injection 5,000 Units Start: 08/02/24 1400  Level of care: Telemetry Cardiac   Code Status: Full Code  Subjective: Patient is seen and examined today morning. She is closing eyes but able to answer me. Does have sore throat, not eating well. She is weak, did not get out of bed.  Physical Exam: Vitals:   08/02/24 1400 08/02/24 1500 08/02/24 1600 08/02/24 1611  BP: (!) 166/71 (!) 150/85 (!) 161/70 (!) 163/78  Pulse:  (!) 56  60  Resp:    16  Temp:    99.1 F (37.3 C)  TempSrc:    Axillary  SpO2:    97%  Weight:      Height:        General - Elderly African American female, distress due to pain HEENT - PERRLA, EOMI, atraumatic head, non tender sinuses. Lung - Clear, basal rales, rhonchi, no wheezes. Heart - S1, S2 heard, no murmurs, rubs, trace pedal edema. Abdomen - Soft, non tender, bowel sounds good Neuro - lethargic, able to follow simple command, non focal exam. Skin - Warm and dry.  Data Reviewed:      Latest Ref Rng & Units 08/02/2024   12:57 AM 08/01/2024    9:24 AM 03/03/2024    3:39 PM  CBC  WBC 4.0 - 10.5 K/uL 10.4  7.7  5.3   Hemoglobin 12.0 - 15.0 g/dL 85.9  87.1  87.8   Hematocrit 36.0 - 46.0 % 43.8  40.3  38.4   Platelets 150 - 400 K/uL 318  269  290       Latest Ref Rng & Units 08/02/2024   12:57 AM 08/01/2024    9:24 AM 03/03/2024    3:39 PM  BMP  Glucose 70 - 99 mg/dL 855  850  97   BUN 8 - 23 mg/dL 48  53  30   Creatinine 0.44 - 1.00 mg/dL 7.23  7.33  8.51   BUN/Creat Ratio 12 - 28   20   Sodium 135 - 145 mmol/L 136  134  138   Potassium 3.5 - 5.1 mmol/L 3.3  3.2  4.2   Chloride 98 - 111 mmol/L 96  97  102   CO2 22 - 32 mmol/L 22  22  18    Calcium  8.9 - 10.3 mg/dL 8.9  8.5  9.0    CT HEAD WO CONTRAST ( ) Result Date: 08/01/2024 CLINICAL DATA:  Initial evaluation for acute mental status change. EXAM: CT HEAD WITHOUT CONTRAST TECHNIQUE: Contiguous axial images were obtained from the base of  the skull through the vertex without intravenous contrast. RADIATION DOSE REDUCTION: This exam was performed according to the departmental dose-optimization program which includes automated exposure control, adjustment of the mA and/or kV according to patient size and/or use of iterative reconstruction technique. COMPARISON:  CT from 12/12/2019 FINDINGS: Brain: Generalized age-related cerebral atrophy. Patchy hypodensity involving the supratentorial cerebral white matter, consistent with chronic small vessel ischemic disease, moderate in nature. Scattered remote lacunar infarcts present about the bilateral basal ganglia, thalami, and pons. No acute intracranial hemorrhage. No acute large vessel territory infarct. No mass lesion, midline shift or mass effect. No hydrocephalus or extra-axial fluid collection. Vascular: No abnormal hyperdense vessel. Calcified atherosclerosis present at skull base. Skull: Scalp soft tissues within normal limits.  Calvarium  intact. Sinuses/Orbits: Globes and orbital soft tissues within normal limits. Changes of chronic right maxillary sinusitis noted. Trace left mastoid effusion noted, of doubtful significance. Other: None. IMPRESSION: 1. No acute intracranial abnormality. 2. Generalized age-related cerebral atrophy with moderate chronic small vessel ischemic disease, with multiple remote lacunar infarcts about the bilateral basal ganglia, thalami, and pons. Electronically Signed   By: Morene Hoard M.D.   On: 08/01/2024 19:03   ECHOCARDIOGRAM COMPLETE Result Date: 08/01/2024    ECHOCARDIOGRAM REPORT   Patient Name:   Beth [name] Date of Exam: 08/01/2024 Medical Rec #:  [id number]    Height:       65.0 in Accession #:    [id number]   Weight:       231.0 lb Date of Birth:  1946/01/10    BSA:          2.103 m Patient Age:    78 years     BP:           181/96 mmHg Patient Gender: F            HR:           57 bpm. Exam Location:  Inpatient Procedure: 2D Echo, Cardiac Doppler and  Color Doppler (Both Spectral and Color            Flow Doppler were utilized during procedure). Indications:    Elevated Troponin  History:        Patient has prior history of Echocardiogram examinations, most                 recent 03/01/2016. CHF, CKD, stage 4; Risk Factors:Hypertension,                 Diabetes and Dyslipidemia.  Sonographer:    Thea Norlander RCS Referring Phys: MAXIMINO DELENA SHARPS  Sonographer Comments: Technically difficult study due to poor echo windows, suboptimal parasternal window, suboptimal apical window and suboptimal subcostal window. IMPRESSIONS  1. Left ventricular ejection fraction, by estimation, is 60 to 65%. The left ventricle has normal function. The left ventricle has no regional wall motion abnormalities. Left ventricular diastolic parameters are consistent with Grade I diastolic dysfunction (impaired relaxation).  2. Right ventricular systolic function is normal. The right ventricular size is normal.  3. The mitral valve is normal in structure. No evidence of mitral valve regurgitation. No evidence of mitral stenosis.  4. The aortic valve is tricuspid. Aortic valve regurgitation is not visualized. Aortic valve sclerosis/calcification is present, without any evidence of aortic stenosis. Aortic valve Vmax measures 1.25 m/s.  5. The inferior vena cava is normal in size with greater than 50% respiratory variability, suggesting right atrial pressure of 3 mmHg. FINDINGS  Left Ventricle: Left ventricular ejection fraction, by estimation, is 60 to 65%. The left ventricle has normal function. The left ventricle has no regional wall motion abnormalities. The left ventricular internal cavity size was normal in size. There is  no left ventricular hypertrophy. Left ventricular diastolic parameters are consistent with Grade I diastolic dysfunction (impaired relaxation). Normal left ventricular filling pressure. Right Ventricle: The right ventricular size is normal. No increase in right  ventricular wall thickness. Right ventricular systolic function is normal. Left Atrium: Left atrial size was normal in size. Right Atrium: Right atrial size was normal in size. Pericardium: There is no evidence of pericardial effusion. Mitral Valve: The mitral valve is normal in structure. No evidence of mitral valve regurgitation. No evidence of mitral valve stenosis. Tricuspid Valve: The  tricuspid valve is normal in structure. Tricuspid valve regurgitation is trivial. No evidence of tricuspid stenosis. Aortic Valve: The aortic valve is tricuspid. Aortic valve regurgitation is not visualized. Aortic valve sclerosis/calcification is present, without any evidence of aortic stenosis. Aortic valve peak gradient measures 6.2 mmHg. Pulmonic Valve: The pulmonic valve was normal in structure. Pulmonic valve regurgitation is not visualized. No evidence of pulmonic stenosis. Aorta: The aortic root is normal in size and structure. Venous: The inferior vena cava is normal in size with greater than 50% respiratory variability, suggesting right atrial pressure of 3 mmHg. IAS/Shunts: No atrial level shunt detected by color flow Doppler.  LEFT VENTRICLE PLAX 2D LVOT diam:     1.90 cm   Diastology LV SV:         50        LV e' medial:    4.35 cm/s LV SV Index:   24        LV E/e' medial:  13.2 LVOT Area:     2.84 cm  LV e' lateral:   6.53 cm/s                          LV E/e' lateral: 8.8  RIGHT VENTRICLE             IVC RV S prime:     21.90 cm/s  IVC diam: 1.70 cm TAPSE (M-mode): 1.7 cm LEFT ATRIUM           Index        RIGHT ATRIUM           Index LA Vol (A2C): 61.5 ml 29.24 ml/m  RA Area:     12.70 cm                                    RA Volume:   20.10 ml  9.56 ml/m  AORTIC VALVE AV Area (Vmax): 1.77 cm AV Vmax:        125.00 cm/s AV Peak Grad:   6.2 mmHg LVOT Vmax:      78.10 cm/s LVOT Vmean:     49.800 cm/s LVOT VTI:       0.178 m  AORTA Ao Root diam: 2.90 cm Ao Asc diam:  3.50 cm MITRAL VALVE MV Area (PHT): 2.62 cm     SHUNTS MV Decel Time: 289 msec    Systemic VTI:  0.18 m MV E velocity: 57.60 cm/s  Systemic Diam: 1.90 cm MV A velocity: 83.30 cm/s MV E/A ratio:  0.69 Wilbert Bihari MD Electronically signed by Wilbert Bihari MD Signature Date/Time: 08/01/2024/5:27:37 PM    Final    DG CHEST PORT 1 VIEW Result Date: 08/01/2024 CLINICAL DATA:  Shortness of breath EXAM: PORTABLE CHEST 1 VIEW COMPARISON:  03/31/2014 FINDINGS: Low lung volumes accentuate cardiomediastinal silhouette and pulmonary vascularity. No focal consolidation, pleural effusion, or pneumothorax. No displaced rib fractures. IMPRESSION: Hypoventilation.  No acute cardiopulmonary process. Electronically Signed   By: Norman Gatlin M.D.   On: 08/01/2024 14:26    Family Communication: Discussed with patient's daughters at bedside, understand and agree. All questions answered.  Disposition: Status is: Inpatient Remains inpatient appropriate because: mental status poor, AKI, elevated trop  Planned Discharge Destination: Home with Home Health     Time spent: 52 minutes  Author: Concepcion Riser, MD 08/02/2024 6:21 PM Secure chat 7am to 7pm For on call  review [email or web address].

## 2024-08-02 NOTE — Evaluation (Signed)
 Clinical/Bedside Swallow Evaluation Patient Details  Name: Beth [name] MRN: [id number] Date of Birth: 1946/02/16  Today's Date: 08/02/2024 Time: SLP Start Time (ACUTE ONLY): 1340 SLP Stop Time (ACUTE ONLY): 1348 SLP Time Calculation (min) (ACUTE ONLY): 8 min  Past Medical History:  Past Medical History:  Diagnosis Date   Anemia    Arthritis    Knee both knees   Blood transfusion without reported diagnosis 2012   anemia;pt denies transfusion stated was only on iron  tablet   Cataract    left   CKD (chronic kidney disease), stage III (HCC)    Diabetes mellitus without complication (HCC)    Family history of anesthesia complication    sister very slow to awaken after anesthesia;severe vomiting    Gout    left elbow   Herpes infection 08/09/2014   Saw doctor Wed. 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   Other acute osteomyelitis, right femur (HCC) 07/23/2020   Pseudomonas aeruginosa infection 12/15/2018   Pseudomonas aeruginosa infection 12/15/2018   Septic arthritis of knee, right (HCC) 08/27/2020   Stroke (HCC) 2006   x 1 no deficits noted    Past Surgical History:  Past Surgical History:  Procedure Laterality Date   ABDOMINAL HYSTERECTOMY  1983   CARPAL TUNNEL RELEASE Right 1983   colonscopy  June 21, 2014   EXCISIONAL TOTAL KNEE ARTHROPLASTY WITH ANTIBIOTIC SPACERS Right 02/16/2015   Procedure: RIGHT KNEE RESECTION ARTHROPLASTY WITH ANTIBIOTIC SPACERS;  Surgeon: Dempsey Moan, MD;  Location: [facility];  Service: Orthopedics;  Laterality: Right;   EXCISIONAL TOTAL KNEE ARTHROPLASTY WITH ANTIBIOTIC SPACERS Right 11/03/2018   Procedure: Right knee resection arthroplasty; antibiotic spacer;  Surgeon: Moan Dempsey, MD;  Location: [facility];  Service: Orthopedics;   Laterality: Right;  Adductor Block   I & D KNEE WITH POLY EXCHANGE Right 10/02/2014   Procedure: IRRIGATION AND DEBRIDEMENT RIGHT KNEE WITH POLY EXCHANGE;  Surgeon: Dempsey Moan GAILS, MD;  Location: [facility];  Service: Orthopedics;  Laterality: Right;   I & D KNEE WITH POLY EXCHANGE Right 08/27/2020   Procedure: IRRIGATION AND DEBRIDEMENT; SPACER EXCHANGE RIGHT KNEE with multiple specimens;  Surgeon: Moan Dempsey, MD;  Location: [facility];  Service: Orthopedics;  Laterality: Right;    INCISION AND DRAINAGE OF WOUND Right 01/14/2017   Procedure: IRRIGATION AND DEBRIDEMENT WOUND;  Surgeon: Dempsey Moan, MD;  Location: [facility];  Service: Orthopedics;  Laterality: Right;  requests   JOINT REPLACEMENT  06/2014   right knee   nasal cauterization  2012   PATELLAR TENDON REPAIR Right 08/11/2014   Procedure: RIGHT PATELLA TENDON REPAIR;  Surgeon: Dempsey Moan GAILS, MD;  Location: [facility];  Service: Orthopedics;  Laterality: Right;   REIMPLANTATION OF TOTAL KNEE Right 05/23/2015   Procedure: RIGHT KNEE ARTHROPLASTY REIMPLANTATION;  Surgeon: Dempsey Moan, MD;  Location: [facility];  Service: Orthopedics;  Laterality: Right;   TOTAL KNEE ARTHROPLASTY Right 07/03/2014   Procedure: RIGHT TOTAL KNEE ARTHROPLASTY;  Surgeon: Dempsey Moan GAILS, MD;  Location: [facility];  Service: Orthopedics;  Laterality: Right;   TUBAL LIGATION     HPI:  78 yo female adm 08/01/24 with HA, HTN and NSTEMI. Presents with N/V, lethargy. Granddaughter reports sore throat due to repetitive vomiting prior to admission, but  no coughing with PO intake. PMhx: HTN, HLD, dCHF, CVA, T2DM, CKD, Rt TKA with infection and replacement    Assessment / Plan / Recommendation  Clinical Impression  Beth [name] was sleepy, kept eyes closed during brief assessment, answered basic questions but was unable to fully participate in assessment beyond ice chips and water .  She retained both in her mouth with eventual swallowing and no s/s of aspiration.  Needed constant  coaxing to wake up and participate.  Recommend continuing to send regular trays/thin liquids; allow her to eat if she is awake and shows some motivation to do so.  SLP will f/u next date to reassess when more awake. Granddtr agrees with plan. SLP Visit Diagnosis: Dysphagia, unspecified (R13.10)    Aspiration Risk   (unknown)    Diet Recommendation   Thin;Age appropriate regular  Medication Administration: Crushed with puree    Other  Recommendations Oral Care Recommendations: Oral care BID             Frequency and Duration min 2x/week  1 week       Prognosis Prognosis for improved oropharyngeal function: Good      Swallow Study   General HPI: 78 yo female adm 08/01/24 with HA, HTN and NSTEMI. Presents with N/V, lethargy. Granddaughter reports sore throat due to repetitive vomiting prior to admission, but no coughing with PO intake. PMhx: HTN, HLD, dCHF, CVA, T2DM, CKD, Rt TKA with infection and replacement Type of Study: Bedside Swallow Evaluation Previous Swallow Assessment: no Diet Prior to this Study: Regular;Thin liquids (Level 0) Temperature Spikes Noted: No Respiratory Status: Room air History of Recent Intubation: No Behavior/Cognition: Lethargic/Drowsy Oral Cavity Assessment: Dried secretions Oral Care Completed by SLP: Recent completion by staff Oral Cavity - Dentition: Missing dentition Vision:  (kept eyes closed) Self-Feeding Abilities: Total assist Patient Positioning: Upright in bed Baseline Vocal Quality: Normal Volitional Cough: Cognitively unable to elicit Volitional Swallow: Able to elicit    Oral/Motor/Sensory Function Overall Oral Motor/Sensory Function: Within functional limits   Ice Chips Ice chips: Impaired Oral Phase Functional Implications: Oral holding   Thin Liquid Thin Liquid: Impaired Presentation: Spoon Oral Phase Functional Implications: Oral holding    Nectar Thick Nectar Thick Liquid: Not tested   Honey Thick Honey Thick Liquid: Not  tested   Puree Puree: Not tested   Solid     Solid: Not tested      [name] 08/02/2024,1:58 PM Kerstie Agent L. Vona, MA CCC/SLP Clinical Specialist - Acute Care SLP Acute Rehabilitation Services Office number [phone]

## 2024-08-02 NOTE — TOC Benefit Eligibility Note (Signed)
 Pharmacy Patient Advocate Encounter  Insurance verification completed.    The patient is insured through [employer]/ [employer]. Patient has Medicare and is not eligible for a copay card, but may be able to apply for patient assistance or Medicare RX Payment Plan (Patient Must reach out to their plan, if eligible for payment plan), if available.    Ran test claim for Entresto 24-26mg  and the current 30 day co-pay is $0.   This test claim was processed through [employer]- copay amounts may vary at other pharmacies due to [employer], or as the patient moves through the different stages of their insurance plan.

## 2024-08-02 NOTE — Evaluation (Signed)
 Physical Therapy Evaluation Patient Details Name: Beth [name] MRN: [id number] DOB: 1946/10/11 Today's Date: 08/02/2024  History of Present Illness  78 yo female adm 08/01/24 with HA, HTN and NSTEMI. PMhx: HTN, HLD, dCHF, CVA, T2DM, CKD, Rt TKA with infection and replacement  Clinical Impression  Pt lethargic throughout session, maintaining eyes closed despite max stimulation (verbal, cold wash cloth, moving limbs, repositioning bed)  and able to state name, birthdate and respond with yes/no but would not open eyes or actively move limbs on command or toward EOB. She did pull legs back into bed and son present to provide home setup and PLOF. Very limited mobility assessment due to cognition and pt will benefit from continued acute therapy attempts to maximize mobility, safety and function. Son reports pt desire to return home but would have to be mod I level and son open to post acute rehab pending pt progression. Patient will benefit from continued inpatient follow up therapy, <3 hours/day       If plan is discharge home, recommend the following: A lot of help with walking and/or transfers;A lot of help with bathing/dressing/bathroom;Assistance with feeding;Assist for transportation;Direct supervision/assist for financial management;Assistance with cooking/housework;Direct supervision/assist for medications management;Supervision due to cognitive status   Can travel by private vehicle   No    Equipment Recommendations Other (comment) (TBD with progression)  Recommendations for Other Services  OT consult    Functional Status Assessment Patient has had a recent decline in their functional status and/or demonstrates limited ability to make significant improvements in function in a reasonable and predictable amount of time     Precautions / Restrictions Precautions Precautions: Fall;Other (comment) Recall of Precautions/Restrictions: Impaired Precaution/Restrictions Comments: Rt knee spacer       Mobility  Bed Mobility Overal bed mobility: Needs Assistance             General bed mobility comments: attempted to initiate supine to sit with max assist to initiate movement of LB toward EOB. Pt independently pulling LLE back to surface but not engaging core or UB to assist to pivot to EOB or move on command. Will require +2 assist to mobilize at this time due to AMS, maintaining eyes closed throughout despite verbalization and max stimulation for arousal    Transfers                        Ambulation/Gait                  Stairs            Wheelchair Mobility     Tilt Bed    Modified Rankin (Stroke Patients Only)       Balance                                             Pertinent Vitals/Pain Pain Assessment Pain Assessment: No/denies pain    Home Living Family/patient expects to be discharged to:: Private residence Living Arrangements: Alone Available Help at Discharge: Family;Personal care attendant;Available PRN/intermittently Type of Home: House Home Access: Ramped entrance       Home Layout: One level Home Equipment: Agricultural consultant (2 wheels);Cane - single point;Wheelchair - manual;BSC/3in1      Prior Function Prior Level of Function : Needs assist             Mobility Comments: walks  with RW in home grossly 84' distance, doesn't drive ADLs Comments: family checks in and has PCA MWF for 4 hours for IADLs, daughter assists with bathing     Extremity/Trunk Assessment   Upper Extremity Assessment Upper Extremity Assessment: Generalized weakness;Difficult to assess due to impaired cognition    Lower Extremity Assessment Lower Extremity Assessment: Generalized weakness;Difficult to assess due to impaired cognition       Communication   Communication Communication: No apparent difficulties    Cognition Arousal: Lethargic Behavior During Therapy: Flat affect   PT - Cognitive  impairments: Difficult to assess Difficult to assess due to: Level of arousal                       Following commands: Impaired       Cueing Cueing Techniques: Verbal cues, Tactile cues     General Comments      Exercises     Assessment/Plan    PT Assessment Patient needs continued PT services  PT Problem List Decreased strength;Decreased activity tolerance;Decreased balance;Decreased range of motion;Decreased cognition;Decreased coordination;Decreased safety awareness;Decreased mobility;Obesity       PT Treatment Interventions Gait training;DME instruction;Balance training;Neuromuscular re-education;Cognitive remediation;Therapeutic activities;Functional mobility training;Therapeutic exercise;Patient/family education    PT Goals (Current goals can be found in the Care Plan section)  Acute Rehab PT Goals Patient Stated Goal: return home PT Goal Formulation: With family Time For Goal Achievement: 08/16/24 Potential to Achieve Goals: Fair    Frequency Min 2X/week     Co-evaluation               AM-PAC PT 6 Clicks Mobility  Outcome Measure Help needed turning from your back to your side while in a flat bed without using bedrails?: A Lot Help needed moving from lying on your back to sitting on the side of a flat bed without using bedrails?: Total Help needed moving to and from a bed to a chair (including a wheelchair)?: Total Help needed standing up from a chair using your arms (e.g., wheelchair or bedside chair)?: Total Help needed to walk in hospital room?: Total Help needed climbing 3-5 steps with a railing? : Total 6 Click Score: 7    End of Session   Activity Tolerance: Patient limited by lethargy Patient left: in bed;with bed alarm set;with call bell/phone within reach;with family/visitor present Nurse Communication: Mobility status;Need for lift equipment PT Visit Diagnosis: Other abnormalities of gait and mobility (R26.89);Difficulty in  walking, not elsewhere classified (R26.2);Muscle weakness (generalized) (M62.81)    Time: 9098-9082 PT Time Calculation (min) (ACUTE ONLY): 16 min   Charges:   PT Evaluation $PT Eval Moderate Complexity: 1 Mod   PT General Charges $$ ACUTE PT VISIT: 1 Visit         Lenoard SQUIBB, PT Acute Rehabilitation Services Office: [phone]   Laney Louderback B Hazim Treadway 08/02/2024, 10:03 AM

## 2024-08-03 DIAGNOSIS — N1832 Chronic kidney disease, stage 3b: Secondary | ICD-10-CM

## 2024-08-03 DIAGNOSIS — E669 Obesity, unspecified: Secondary | ICD-10-CM | POA: Insufficient documentation

## 2024-08-03 DIAGNOSIS — N179 Acute kidney failure, unspecified: Secondary | ICD-10-CM | POA: Diagnosis not present

## 2024-08-03 DIAGNOSIS — R7989 Other specified abnormal findings of blood chemistry: Secondary | ICD-10-CM | POA: Diagnosis not present

## 2024-08-03 DIAGNOSIS — I160 Hypertensive urgency: Secondary | ICD-10-CM | POA: Diagnosis not present

## 2024-08-03 DIAGNOSIS — E876 Hypokalemia: Secondary | ICD-10-CM

## 2024-08-03 DIAGNOSIS — I161 Hypertensive emergency: Secondary | ICD-10-CM | POA: Diagnosis not present

## 2024-08-03 DIAGNOSIS — I214 Non-ST elevation (NSTEMI) myocardial infarction: Secondary | ICD-10-CM | POA: Diagnosis not present

## 2024-08-03 DIAGNOSIS — G9341 Metabolic encephalopathy: Secondary | ICD-10-CM | POA: Diagnosis not present

## 2024-08-03 LAB — CBC
HCT: 41.1 % (ref 36.0–46.0)
Hemoglobin: 13.1 g/dL (ref 12.0–15.0)
MCH: 26.3 pg (ref 26.0–34.0)
MCHC: 31.9 g/dL (ref 30.0–36.0)
MCV: 82.5 fL (ref 80.0–100.0)
Platelets: 303 K/uL (ref 150–400)
RBC: 4.98 MIL/uL (ref 3.87–5.11)
RDW: 15.6 % — ABNORMAL HIGH (ref 11.5–15.5)
WBC: 9.3 K/uL (ref 4.0–10.5)
nRBC: 0 % (ref 0.0–0.2)

## 2024-08-03 LAB — BASIC METABOLIC PANEL WITH GFR
Anion gap: 18 — ABNORMAL HIGH (ref 5–15)
BUN: 53 mg/dL — ABNORMAL HIGH (ref 8–23)
CO2: 21 mmol/L — ABNORMAL LOW (ref 22–32)
Calcium: 8.7 mg/dL — ABNORMAL LOW (ref 8.9–10.3)
Chloride: 102 mmol/L (ref 98–111)
Creatinine, Ser: 3.45 mg/dL — ABNORMAL HIGH (ref 0.44–1.00)
GFR, Estimated: 13 mL/min — ABNORMAL LOW (ref >60)
Glucose, Bld: 159 mg/dL — ABNORMAL HIGH (ref 70–99)
Potassium: 3.2 mmol/L — ABNORMAL LOW (ref 3.5–5.1)
Sodium: 141 mmol/L (ref 135–145)

## 2024-08-03 LAB — GLUCOSE, CAPILLARY
Glucose-Capillary: 154 mg/dL — ABNORMAL HIGH (ref 70–99)
Glucose-Capillary: 181 mg/dL — ABNORMAL HIGH (ref 70–99)
Glucose-Capillary: 252 mg/dL — ABNORMAL HIGH (ref 70–99)
Glucose-Capillary: 205 mg/dL — ABNORMAL HIGH (ref 70–99)

## 2024-08-03 MED ORDER — POTASSIUM CHLORIDE CRYS ER 20 MEQ PO TBCR
20.0000 meq | EXTENDED_RELEASE_TABLET | Freq: Two times a day (BID) | ORAL | Status: DC
  Administered 2024-08-03 (×2): 20 meq via ORAL
  Filled 2024-08-03 (×2): qty 1

## 2024-08-03 MED ORDER — MORPHINE SULFATE (PF) 2 MG/ML IV SOLN: 2.0000 mg | INTRAVENOUS | Status: DC | PRN

## 2024-08-03 MED ORDER — NEBIVOLOL HCL 20 MG PO TABS: 20.0000 mg | ORAL_TABLET | Freq: Every day | ORAL | Status: DC

## 2024-08-03 MED ORDER — ISOSORBIDE MONONITRATE ER 30 MG PO TB24
30.0000 mg | ORAL_TABLET | Freq: Every day | ORAL | Status: DC
  Administered 2024-08-03: 30 mg via ORAL
  Filled 2024-08-03: qty 1

## 2024-08-03 MED ORDER — HYDROMORPHONE HCL 1 MG/ML IJ SOLN
0.5000 mg | INTRAMUSCULAR | Status: DC | PRN
  Administered 2024-08-06: 0.5 mg via INTRAVENOUS
  Filled 2024-08-03 (×2): qty 1

## 2024-08-03 MED ORDER — DIPHENHYDRAMINE HCL 25 MG PO CAPS: 25.0000 mg | ORAL_CAPSULE | Freq: Three times a day (TID) | ORAL | Status: DC | PRN

## 2024-08-03 NOTE — Consult Note (Signed)
 Nephrology Consult   Requesting provider: Darci Pore, MD Service requesting consult: Hospitalist Reason for consult: AKI   Assessment/Recommendations: Beth [name] is a/an 78 y.o. female with a past medical history notable for AKI    Non-Oliguric/Anuric AKI on CKD (worsening): Likely secondary to hypotension/relative hypotension. Had Vomiting PTA, but on MIVF.  Renal function acutely worsened over the last day or so.  This fits with the hypotensive event that occurred 2 days ago.  Assuming this is the etiology to her AKI, at this point we will continue with supportive care. Regarding gentle IVF, hopefully can soon transition to p.o. intake.  She is now eating food. NSTEMI also contributing.   -Chart reviewed: (medications acceptable, does not appear to have been exposed to nephrotoxins with imaging.  Had episodes of significant hypotension).   -Continue to monitor daily Cr, Dose meds for GFR<15 -Monitor Daily I/Os, Daily weight  -May obtain urine sample for sediment analysis if AKI fails to improve with efforts stated above -Maintain MAP>65 for optimal renal perfusion.  -Agree with holding ACE-I, avoid further nephrotoxins including NSAIDS, Morphine .  Unless absolutely necessary, avoid CT with contrast and/or MRI with gadolinium.    -Currently no indication for HD  Volume Status: Appears euvolemic on exam. Based on our examination and review of available imaging, our recommendation is supportive care.  Hypokalemia: Defer to primary    Recommendations conveyed to primary service.    [name] 08/03/2024 10:58 AM   _____________________________________________________________________________________   History of Present Illness: Beth [name] is a/an 78 y.o. female with a past medical history of HTN diastolic HF, CVA, DM2, CKD 3 who presents to [facility] as transfer with malaise.  Recently seen multiple times in the ED for earache, throat  ache, headache, nausea, weakness.  Noted to have very high blood pressures at home multiple antihypertensives.  Was experiencing some nausea and vomiting.  BL Cr around 1.4-2.1 OSH Labs, 07/30/24: Cr 3.31. UA: SG 1.008, protein 70, glucose 1000, no ketones, no blood, 1 RBC.  Labs: Cr 2.66 (8/25)->2.76->3.45 (8/27) CT A/P wo contrast, 07/31/24: Bilateral nephrolithiasis. No ureteral calculus or hydronephrosis.   Initial blood pressure here 166/97.  Very variable.  Lowest reading 120/60.  Highest 188/92.  Received IV Lasix  40, 8/25, 26 On NS MIVF 75ml/h.   Nonoliguric  Seen in bed.  Daughter and granddaughter at bedside.  Most history from them.  She does have longstanding high blood pressure; recently > 200s/100s.  Has had head/sinus issues for the past few weeks, requiring repeat ED evaluations.  Vomiting for the past 2 weeks.  She has never seen a nephrologist, but recently was referred to one.  Daughter saw blood pressure 80s over 60s on 2 days ago.  She is having ongoing ear pain, otherwise feeling better.  No ongoing vomiting.    Medications:  Current Facility-Administered Medications  Medication Dose Route Frequency Provider Last Rate Last Admin   0.9 %  sodium chloride  infusion   Intravenous Continuous Darci Pore, MD 75 mL/hr at 08/03/24 0521 New Bag at 08/03/24 0521   acetaminophen  (TYLENOL ) tablet 650 mg  650 mg Oral Q6H PRN Smith, Rondell A, MD   650 mg at 08/03/24 0522   Or   acetaminophen  (TYLENOL ) suppository 650 mg  650 mg Rectal Q6H PRN Smith, Rondell A, MD       albuterol  (PROVENTIL ) (2.5 MG/3ML) 0.083% nebulizer solution 2.5 mg  2.5 mg Nebulization Q6H PRN Claudene Maximino LABOR, MD  aspirin  EC tablet 81 mg  81 mg Oral Daily Zhao, Xika, NP   81 mg at 08/03/24 0815   atorvastatin  (LIPITOR ) tablet 80 mg  80 mg Oral QPM Zhao, Xika, NP   80 mg at 08/02/24 1834   ciprofloxacin -dexamethasone  (CIPRODEX ) 0.3-0.1 % OTIC (EAR) suspension 4 drop  4 drop Left EAR BID Claudene Reeves A, MD   4 drop at 08/03/24 9175   dextrose  50 % solution 50 mL  1 ampule Intravenous PRN Claudene Reeves LABOR, MD       diltiazem  (CARDIZEM  CD) 24 hr capsule 240 mg  240 mg Oral QPM Claudene, Rondell A, MD   240 mg at 08/02/24 1834   heparin  injection 5,000 Units  5,000 Units Subcutaneous Q8H Henry Shaver B, NP   5,000 Units at 08/03/24 9473   hydrALAZINE  (APRESOLINE ) injection 10 mg  10 mg Intravenous Q4H PRN Claudene Reeves LABOR, MD   10 mg at 08/03/24 0531   hydrALAZINE  (APRESOLINE ) tablet 100 mg  100 mg Oral TID Claudene Reeves LABOR, MD   100 mg at 08/03/24 9183   HYDROmorphone  (DILAUDID ) injection 0.5 mg  0.5 mg Intravenous Q3H PRN Darci Pore, MD       isosorbide  mononitrate (IMDUR ) 24 hr tablet 30 mg  30 mg Oral Daily Henry Shaver B, NP       nystatin  (MYCOSTATIN ) 100000 UNIT/ML suspension 500,000 Units  5 mL Oral QID Smith, Rondell A, MD   500,000 Units at 08/01/24 2134   piperacillin -tazobactam (ZOSYN ) IVPB 3.375 g  3.375 g Intravenous Q8H Smith, Rondell A, MD 12.5 mL/hr at 08/03/24 0522 3.375 g at 08/03/24 0522   sodium chloride  flush (NS) 0.9 % injection 3 mL  3 mL Intravenous Q12H Claudene Reeves A, MD   3 mL at 08/03/24 0827   trimethobenzamide  (TIGAN ) injection 200 mg  200 mg Intramuscular Q6H PRN Claudene Reeves LABOR, MD   200 mg at 08/01/24 1549     ALLERGIES Cefdinir, Aleve [naproxen sodium], Asa [aspirin ], Ciprofloxacin , Clonidine  derivatives, and Shellfish allergy  MEDICAL HISTORY Past Medical History:  Diagnosis Date   Anemia    Arthritis    Knee both knees   Blood transfusion without reported diagnosis 2012   anemia;pt denies transfusion stated was only on iron  tablet   Cataract    left   CKD (chronic kidney disease), stage III (HCC)    Diabetes mellitus without complication (HCC)    Family history of anesthesia complication    sister very slow to awaken after anesthesia;severe vomiting    Gout    left elbow   Herpes infection 08/09/2014   Saw doctor Wed.  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   Other acute osteomyelitis, right femur (HCC) 07/23/2020   Pseudomonas aeruginosa infection 12/15/2018   Pseudomonas aeruginosa infection 12/15/2018   Septic arthritis of knee, right (HCC) 08/27/2020   Stroke (HCC) 2006   x 1 no deficits noted      SOCIAL HISTORY Social History   Socioeconomic History   Marital status: Widowed    Spouse name: Not on file   Number of children: 9   Years of education: 8th   Highest education level: 8th grade  Occupational History    Employer: RETIRED  Tobacco Use  Smoking status: Never   Smokeless tobacco: Never  Vaping Use   Vaping status: Never Used  Substance and Sexual Activity   Alcohol use: No   Drug use: No   Sexual activity: Not Currently  Other Topics Concern   Not on file  Social History Narrative   She has 6 living adult children and 3 who have passed away and many grandchildren and great grandchildren. Patient is retired. She worked part time Education officer, environmental houses and a Research officer, trade union.    One child in [place], others in [place], [place], [place], and [place]   Social Drivers of Health   Financial Resource Strain: Low Risk  (07/20/2024)   Received from [facility]   Overall Financial Resource Strain (CARDIA)    How hard is it for you to pay for the very basics like food, housing, medical care, and heating?: Not hard at all  Food Insecurity: No Food Insecurity (08/01/2024)   Hunger Vital Sign    Worried About Running Out of Food in the Last Year: Never true    Ran Out of Food in the Last Year: Never true  Transportation Needs: No Transportation Needs (08/01/2024)   PRAPARE - Administrator, Civil Service (Medical): No    Lack of Transportation (Non-Medical): No  Recent  Concern: Transportation Needs - Unmet Transportation Needs (07/20/2024)   Received from [facility] - Transportation    Lack of Transportation (Medical): Yes    Lack of Transportation (Non-Medical): Yes  Physical Activity: Inactive (07/20/2024)   Received from [facility]   Exercise Vital Sign    On average, how many days per week do you engage in moderate to strenuous exercise (like a brisk walk)?: 0 days    On average, how many minutes do you engage in exercise at this level?: 0 min  Stress: No Stress Concern Present (07/20/2024)   Received from [facility] - Occupational Stress Questionnaire    Do you feel stress - tense, restless, nervous, or anxious, or unable to sleep at night because your mind is troubled all the time - these days?: Not at all  Social Connections: Moderately Integrated (08/01/2024)   Social Connection and Isolation Panel    Frequency of Communication with Friends and Family: More than three times a week    Frequency of Social Gatherings with Friends and Family: Three times a week    Attends Religious Services: More than 4 times per year    Active Member of Clubs or Organizations: Yes    Attends Banker Meetings: More than 4 times per year    Marital Status: Never married  Recent Concern: Social Connections - Socially Isolated (07/20/2024)   Received from [facility]   Social Connection and Isolation Panel    In a typical week, how many times do you talk on the phone with family, friends, or neighbors?: More than three times a week    How often do you get together with friends or relatives?: Once a week    How often do you attend church or religious services?: Never    Do you belong to any clubs or organizations such as church groups, unions, fraternal or athletic groups, or school groups?: No    How often do you attend meetings of the clubs or organizations you belong to?: Never    Are  you married, widowed, divorced, separated, never married, or living with  a partner?: Never married  Intimate Partner Violence: Not At Risk (08/01/2024)   Humiliation, Afraid, Rape, and Kick questionnaire    Fear of Current or Ex-Partner: No    Emotionally Abused: No    Physically Abused: No    Sexually Abused: No     FAMILY HISTORY Family History  Problem Relation Age of Onset   Ovarian cancer Mother    Cancer Mother    Peripheral vascular disease Father        with amputation of both legs   Hypertension Father    Heart disease Brother 72   Kidney disease Daughter    Heart disease Daughter 46   Diabetes Son    Diabetes Son    Colon cancer Neg Hx    Esophageal cancer Neg Hx    Stomach cancer Neg Hx    Rectal cancer Neg Hx      Review of Systems: 12 systems reviewed Otherwise as per HPI, all other systems reviewed and negative  Physical Exam: Vitals:   08/03/24 0729 08/03/24 0816  BP: (!) 172/73 (!) 165/80  Pulse: 60   Resp: 17   Temp: 98.8 F (37.1 C)   SpO2: 97%    No intake/output data recorded.  Intake/Output Summary (Last 24 hours) at 08/03/2024 1058 Last data filed at 08/03/2024 0407 Gross per 24 hour  Intake 1121.45 ml  Output --  Net 1121.45 ml   General: Elderly-appearing, no acute distress CV: RRR, no peripheral edema Lungs: BLBS, normal work of breathing Abd: soft, non-tender, non-distended Psych: alert, engaged, appropriate mood and affect Musculoskeletal: no obvious deformities Neuro: normal speech, no gross focal deficits   Test Results Reviewed Lab Results  Component Value Date   NA 141 08/03/2024   K 3.2 (L) 08/03/2024   CL 102 08/03/2024   CO2 21 (L) 08/03/2024   BUN 53 (H) 08/03/2024   CREATININE 3.45 (H) 08/03/2024   CALCIUM  8.7 (L) 08/03/2024   ALBUMIN 2.9 (L) 08/01/2024     I have reviewed all relevant outside healthcare records related to the patient's kidney injury.

## 2024-08-03 NOTE — Progress Notes (Addendum)
 Progress Note  Patient Name: Beth [name] Date of Encounter: 08/03/2024 [facility] Cardiologist: None   Interval Summary    Sitting up in the bed, eating and drinking with assistance of family. More alert and interactive today  Vital Signs Vitals:   08/03/24 0500 08/03/24 0600 08/03/24 0729 08/03/24 0816  BP: (!) 188/92 (!) 160/63 (!) 172/73 (!) 165/80  Pulse:  63 60   Resp: 19 20 17    Temp:   98.8 F (37.1 C)   TempSrc:   Axillary   SpO2:  96% 97%   Weight:      Height:        Intake/Output Summary (Last 24 hours) at 08/03/2024 0942 Last data filed at 08/03/2024 0407 Gross per 24 hour  Intake 1121.45 ml  Output --  Net 1121.45 ml      08/03/2024    4:04 AM 08/02/2024    4:24 AM 07/25/2024    2:36 PM  Last 3 Weights  Weight (lbs) 224 lb 3.3 oz 216 lb 14.9 oz 231 lb  Weight (kg) 101.7 kg 98.4 kg 104.781 kg      Telemetry/ECG   Sinus Bradycardia - Personally Reviewed  Physical Exam  GEN: No acute distress.   Neck: No JVD Cardiac: RRR, no murmurs, rubs, or gallops.  Respiratory: Clear to auscultation bilaterally. GI: Soft, nontender, non-distended  MS: No edema  Assessment & Plan   78 y.o. female with a hx of mitral regurgitation, hypertension, CKD stage IIIb, chronic diastolic heart failure, first-degree AV block, dyslipidemia, type 2 diabetes, chronic lower extremity edema, CVA, who was seen 08/01/2024 for the evaluation of non-STEMI at the request of Dr. Claudene.    NSTEMI -- presented with generalized fatigue, headache, throat pain and nausea and emesis for 2 weeks. -- Found to have elevated troponins in the 300s at [facility] with repeat high-sensitivity troponin here 2146>>2286>>2066>>2037 -- She denies any chest pain, just reports generalized pain this morning. -- unclear etiology though could be demand ischemia in the setting of hypertensive emergency and AKI --Echo 8/25 with LVEF of 60 to 65%, grade 1 diastolic dysfunction, normal RV, no significant  valvular disease -- Given her comorbidities, renal function and age would favor treating her medically without invasive angiography for the time being -- initially treated with IV heparin  (stopped 8/26), continue ASA alone with hx of falls    Hypertensive emergency -- blood pressures elevated this morning, though also complaining of generalized pain -- continue Diltiazem  240mg  daily (unable to further titrate with bradycardia), hydralazine  100mg  TID. Home hydrochlorothiazide /ARB held with renal dysfunction  -- given IV lasix  40mg  daily on admission, stopped yesterday -- add Imdur  30mg  daily (on Isrodil PTA)   AKI on CKD IIIb -- 2.66>>2.76>>3.45 -- initially treated with IV lasix , suspect she is dry today -- receiving IVFs  -- follow BMET  Per Primary Acute metabolic encephalopathy Acute otitis externa, left Hypokalemia Type 2 diabetes  For questions or updates, please contact [facility] Please consult [email or web address] for contact info under       Signed, Manuelita Rummer, NP   ATTENDING ATTESTATION:  After conducting a review of all available clinical information with the care team, interviewing the patient, and performing a physical exam, I agree with the findings and plan described in this note.   GEN: No acute distress, AO x 3 HEENT:  MMM, no JVD, no scleral icterus Cardiac: RRR, no murmurs, rubs, or gallops.  Respiratory: Clear to auscultation bilaterally. GI: Soft, nontender, non-distended  MS: No edema; No deformity. Neuro:  Nonfocal  Vasc:  +2 radial pulses  Patient much more alert today.  She denies any chest pain or shortness of breath currently.  Her course today has been complicated by the development of worsening kidney function with a creatinine of around 3.4.  She is now receiving IV fluids.  Nephrology will be consulted.  Would continue medical therapy for acute coronary syndrome with aspirin  and aggressive blood pressure control.  I spoke with patient  and family at bedside and they agree with our plan to defer invasive assessment.  I also spoke with hospital medicine attending Dr. Darci about the patient and her current issues.  Cardiology will sign off.  Call with questions.  Lurena Red, MD Pager [phone]

## 2024-08-03 NOTE — Progress Notes (Addendum)
 Progress Note   Patient: Beth [name] FMW:[id number] DOB: 1946-10-07 DOA: 08/01/2024     2 DOS: the patient was seen and examined on 08/03/2024   Brief hospital course: NEVIN KOZUCH is a 78 y.o. female with medical history significant of hypertension, hyperlipidemia, diastolic CHF, CVA, diabetes mellitus type 2, and chronic kidney disease stage IIIb presents with elevated blood pressure, earache, throat ache, headache, nausea, and weakness for further evaluation.  Outside facility labs from 8/23 noted WBC 9.1, hemoglobin 13.5, sodium 133, potassium 3.3, chloride 95, BUN 65, creatinine 3.31, proBNP 1649,  high-sensitivity troponin 374-> 358-> 350,  and delta at  hsTrop 16->8.  CT scan of the head showed no acute intercranial abnormality with opacification of the right maxillary sinus that was new.  Chest x-ray noted mild cardiomegaly with left ventricular prominence with lungs otherwise noted to be clear.  Urinalysis was significant for protein and glucose, but no signs for infection.  CT scan of the abdomen pelvis was also done that did not show any signs of hydronephrosis or ureteral calculus. Workup notable for left ear otitis externa started on clindamycin and Rocephin  admitted to TRH service with cardiology evaluation for elevated troponin.  Assessment and Plan: Elevated troponin Acute.  Patient presented with complaints of generalized malaise and substernal chest pain.. Found to have high-sensitivity troponins at the outside facility374-> 358-> 350  and delta at  hsTrop 16->8. High-sensitivity troponin elevated> 2300 Echocardiogram 8/25 LVEF of 60 to 65%, grade 1 diastolic dysfunction, normal RV, no significant valvular disease. Cardiology advised no further cardiac intervention due to her age, kidney dysfunction, poor functional level of activity. Heparin  drip stopped. Medical therapy with Aspirin  + statin.   Hypertensive urgency Held beta-blocker due to bradycardia Continue hydralazine  and  Cardizem . Started imdur  per cardiology. Hold ARB/ hydrochlorothiazide  due to kidney dysfunction. Hydralazine  IV as needed for elevated blood pressure   Acute metabolic encephalopathy Patient presented more lethargic and altered.  No focal deficits appreciated on physical exam. CT scan of the head unremarkable. Today she is more alert, able to answer me. Continue Neurochecks.  Heart failure with preserved ejection fraction Hold diuretic as she is not hypervolemic, and with her kidney dysfunction. Strict I&O's and daily weights Follow-up echocardiogram   Acute kidney injury superimposed on chronic kidney disease IIIb Patient presented to the outside facility with creatinine elevated up to 3.31 at the outside facility.  Repeat check here noted to be 2.66 with BUN 53.  Baseline creatinine previously noted to be around 1.5- 2. Very gentle IV fluids for now. Monitor daily renal function. Nephrology evaluation called, follow recommendations.   Otitis externa At the outside facility patient had been given clindamycin and Rocephin . Ciprofloxacin  eardrops twice daily Zosyn  IV in the acute setting as unable to give Cipro  due to prolonged QT interval   Sore throat Aspiration precautions with elevation head of the bed Nystatin  swish and swallow ordered. SLP advised regular diet.   Prolonged QT interval QT prolonged at 508. Correct electrolyte abnormalities Avoid further QT prolonging medications.  Hypokalemia- Oral replacement ordered.   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 PT/ OT on board, advised SNF placement.  Obesity Class II- Diet, exercise and weight reductions.     Out of bed to chair. Incentive  spirometry.  Nursing supportive care. Fall, aspiration precautions. Diet:  Diet Orders (From admission, onward)     Start     Ordered   08/01/24 0727  Diet heart healthy/carb modified Room service appropriate? Yes; Fluid consistency: Thin  Diet effective now       Question Answer Comment  Diet-HS Snack? Nothing   Room service appropriate? Yes   Fluid consistency: Thin      08/01/24 0728           DVT prophylaxis: heparin  injection 5,000 Units Start: 08/02/24 1400  Level of care: Telemetry Cardiac   Code Status: Full Code  Subjective: Patient is seen and examined today morning. She is more alert today, able to answer mel. She is weak, advised to work with PT, encouraged incentive spirometry. Has left ear itching.  Physical Exam: Vitals:   08/03/24 0729 08/03/24 0816 08/03/24 1102 08/03/24 1600  BP: (!) 172/73 (!) 165/80 (!) 162/84 (!) 147/79  Pulse: 60  70 63  Resp: 17  20 19   Temp: 98.8 F (37.1 C)  98.9 F (37.2 C) 97.9 F (36.6 C)  TempSrc: Axillary  Axillary Oral  SpO2: 97%  92% 93%  Weight:      Height:        General - Elderly African American female, no acute distress. HEENT - PERRLA, EOMI, atraumatic head, non tender sinuses. Lung - Clear, basal rales, rhonchi, no wheezes. Heart - S1, S2 heard, no murmurs, rubs, trace pedal edema. Abdomen - Soft, non tender, bowel sounds good Neuro - alert, awake and oriented, non focal exam. Skin - Warm and dry. Left ear dressing noted.  Data Reviewed:      Latest Ref Rng & Units 08/03/2024    2:41 AM 08/02/2024   12:57 AM 08/01/2024    9:24 AM  CBC  WBC 4.0 - 10.5 K/uL 9.3  10.4  7.7   Hemoglobin 12.0 - 15.0 g/dL 86.8  85.9  87.1   Hematocrit 36.0 - 46.0 % 41.1  43.8  40.3   Platelets 150 - 400 K/uL 303  318  269       Latest Ref Rng & Units 08/03/2024    2:41 AM 08/02/2024   12:57 AM 08/01/2024    9:24 AM  BMP  Glucose 70 - 99 mg/dL 840  855  850   BUN 8 - 23 mg/dL 53  48  53   Creatinine 0.44 - 1.00 mg/dL  6.54  7.23  7.33   Sodium 135 - 145 mmol/L 141  136  134   Potassium 3.5 - 5.1 mmol/L 3.2  3.3  3.2   Chloride 98 - 111 mmol/L 102  96  97   CO2 22 - 32 mmol/L 21  22  22    Calcium  8.9 - 10.3 mg/dL 8.7  8.9  8.5    CT HEAD WO CONTRAST ( ) Result Date: 08/01/2024 CLINICAL DATA:  Initial evaluation for acute mental status change. EXAM: CT HEAD WITHOUT CONTRAST TECHNIQUE: Contiguous axial images were obtained from the base of the skull through the vertex without intravenous contrast. RADIATION DOSE REDUCTION: This exam was performed according to the departmental dose-optimization program which includes automated exposure control, adjustment of the mA and/or kV according to patient size and/or use of iterative reconstruction technique. COMPARISON:  CT from 12/12/2019 FINDINGS: Brain: Generalized age-related cerebral atrophy. Patchy hypodensity involving the supratentorial cerebral white matter, consistent with chronic small vessel ischemic disease, moderate in nature. Scattered remote lacunar infarcts present about the bilateral basal  ganglia, thalami, and pons. No acute intracranial hemorrhage. No acute large vessel territory infarct. No mass lesion, midline shift or mass effect. No hydrocephalus or extra-axial fluid collection. Vascular: No abnormal hyperdense vessel. Calcified atherosclerosis present at skull base. Skull: Scalp soft tissues within normal limits.  Calvarium intact. Sinuses/Orbits: Globes and orbital soft tissues within normal limits. Changes of chronic right maxillary sinusitis noted. Trace left mastoid effusion noted, of doubtful significance. Other: None. IMPRESSION: 1. No acute intracranial abnormality. 2. Generalized age-related cerebral atrophy with moderate chronic small vessel ischemic disease, with multiple remote lacunar infarcts about the bilateral basal ganglia, thalami, and pons. Electronically Signed   By: Morene Hoard M.D.   On: 08/01/2024 19:03    Family  Communication: Discussed with patient's daughters at bedside, understand and agree. All questions answered.  Disposition: Status is: Inpatient Remains inpatient appropriate because: , AKI, elevated trop, debility.  Planned Discharge Destination: Home with Home Health     Time spent: 52 minutes  Author: Concepcion Riser, MD 08/03/2024 5:49 PM Secure chat 7am to 7pm For on call review [email or web address].

## 2024-08-03 NOTE — Plan of Care (Signed)

## 2024-08-03 NOTE — Evaluation (Signed)
 Occupational Therapy Evaluation Patient Details Name: Beth [name] MRN: [id number] DOB: 1946-05-30 Today's Date: 08/03/2024   History of Present Illness   78 yo female adm 08/01/24 with HA, HTN and NSTEMI. PMhx: HTN, HLD, dCHF, CVA, T2DM, CKD, Rt TKA with infection and replacement     Clinical Impressions Pt admitted based on above, and was seen based on problem list below. PTA pt was requiring min to mod assist with ADLs and IADLs. Today pt is requiring mod to total assist for bed level ADLs. Bed mobility was  max assist for supine<>sit. Pt with decreased arousal level, but improvement from previous day per son. Pt with significant BUE weakness and sensation limiting ability to perform even basic ADLs or sit EOB. Recommendation of <3 hours of skilled rehab daily. OT will continue to follow acutely to maximize functional independence.        If plan is discharge home, recommend the following:   Two people to help with walking and/or transfers;Two people to help with bathing/dressing/bathroom;Assistance with feeding     Functional Status Assessment   Patient has had a recent decline in their functional status and demonstrates the ability to make significant improvements in function in a reasonable and predictable amount of time.     Equipment Recommendations   Other (comment) (Defer to next venue)      Precautions/Restrictions   Precautions Precautions: Fall;Other (comment) Recall of Precautions/Restrictions: Impaired Precaution/Restrictions Comments: Rt knee spacer Restrictions Weight Bearing Restrictions Per Provider Order: No     Mobility Bed Mobility Overal bed mobility: Needs Assistance Bed Mobility: Supine to Sit, Sit to Supine     Supine to sit: Max assist Sit to supine: Max assist   General bed mobility comments: Max assist for BLEs and trunk supine <>sit pt able to participate with trunk with HH assist, +2 to scoot up in bed    Transfers Overall  transfer level: Needs assistance       General transfer comment: Not safe to attempt      Balance Overall balance assessment: Needs assistance Sitting-balance support: Bilateral upper extremity supported, Feet supported Sitting balance-[name]: Poor Sitting balance - Comments: min to CGA to maintain EOB           ADL either performed or assessed with clinical judgement   ADL Overall ADL's : Needs assistance/impaired Eating/Feeding: Set up;Sitting   Grooming: Wash/dry face;Moderate assistance;Bed level Grooming Details (indicate cue type and reason): Mod assist hand over hand Upper Body Bathing: Moderate assistance;Maximal assistance;Bed level   Lower Body Bathing: Total assistance;Bed level   Upper Body Dressing : Moderate assistance;Bed level   Lower Body Dressing: Total assistance;Bed level       Toileting- Clothing Manipulation and Hygiene: Total assistance;Bed level         General ADL Comments: Pt with significantly decreased weakness, inability to lift dominant RUE against gravity to assist with ADLs, CGA to min assist just to maintain EOB     Vision Baseline Vision/History: 0 No visual deficits Vision Assessment?: No apparent visual deficits            Pertinent Vitals/Pain Pain Assessment Pain Assessment: Faces Faces Pain Scale: Hurts even more Pain Location: Back Pain Descriptors / Indicators: Grimacing, Moaning Pain Intervention(s): Repositioned     Extremity/Trunk Assessment Upper Extremity Assessment Upper Extremity Assessment: Generalized weakness;RUE deficits/detail;LUE deficits/detail RUE Deficits / Details: Significant Bil weakness R>L PROM WFL, unable to lift arm against gravity, decreased sensation RUE Sensation: decreased light touch;decreased proprioception RUE Coordination:  decreased fine motor;decreased gross motor LUE Deficits / Details: Able to raise arm against gravity 3-/5, decreased sensation LUE Sensation: decreased light  touch;decreased proprioception LUE Coordination: decreased fine motor;decreased gross motor   Lower Extremity Assessment Lower Extremity Assessment: Defer to PT evaluation   Cervical / Trunk Assessment Cervical / Trunk Assessment: Kyphotic   Communication Communication Communication: No apparent difficulties   Cognition Arousal: Lethargic Behavior During Therapy: Flat affect Cognition: Cognition impaired, Difficult to assess Difficult to assess due to: Level of arousal   Awareness: Online awareness impaired, Intellectual awareness impaired Memory impairment (select all impairments): Short-term memory, Working memory Attention impairment (select first level of impairment): Focused attention Executive functioning impairment (select all impairments): Initiation, Organization, Sequencing, Reasoning, Problem solving OT - Cognition Comments: Pt with decreased arousal, inconsistently following simple commands, aware of weakness but overall limited insight into deficits and situation       Following commands: Impaired Following commands impaired: Follows one step commands inconsistently     Cueing  General Comments   Cueing Techniques: Verbal cues;Tactile cues  RN notified of pt's need fo feeder, son present and helpful           Home Living Family/patient expects to be discharged to:: Private residence Living Arrangements: Alone Available Help at Discharge: Family;Personal care attendant;Available PRN/intermittently Type of Home: House Home Access: Ramped entrance     Home Layout: One level     Bathroom Shower/Tub: Producer, television/film/video: Standard     Home Equipment: Agricultural consultant (2 wheels);Cane - single point;Wheelchair - manual;BSC/3in1          Prior Functioning/Environment Prior Level of Function : Needs assist       Mobility Comments: walks with RW in home grossly 50' distance, doesn't drive ADLs Comments: family checks in and has PCA MWF  for 4 hours for IADLs, daughter assists with bathing    OT Problem List: Decreased strength;Decreased range of motion;Decreased activity tolerance;Impaired balance (sitting and/or standing);Decreased cognition;Decreased safety awareness;Decreased knowledge of use of DME or AE;Cardiopulmonary status limiting activity   OT Treatment/Interventions: Self-care/ADL training;Therapeutic exercise;Energy conservation;DME and/or AE instruction;Therapeutic activities;Balance training;Patient/family education      OT Goals(Current goals can be found in the care plan section)   Acute Rehab OT Goals Patient Stated Goal: None stated OT Goal Formulation: With patient/family Time For Goal Achievement: 08/17/24 Potential to Achieve Goals: Good   OT Frequency:  Min 2X/week       AM-PAC OT 6 Clicks Daily Activity     Outcome Measure Help from another person eating meals?: A Lot Help from another person taking care of personal grooming?: A Lot Help from another person toileting, which includes using toliet, bedpan, or urinal?: Total Help from another person bathing (including washing, rinsing, drying)?: Total Help from another person to put on and taking off regular upper body clothing?: A Lot Help from another person to put on and taking off regular lower body clothing?: Total 6 Click Score: 9   End of Session Equipment Utilized During Treatment: Gait belt Nurse Communication: Mobility status  Activity Tolerance: Patient limited by lethargy;Patient limited by pain Patient left: in bed;with call bell/phone within reach;with bed alarm set;with family/visitor present  OT Visit Diagnosis: Unsteadiness on feet (R26.81);Other abnormalities of gait and mobility (R26.89);Muscle weakness (generalized) (M62.81)                Time: 9166-9092 OT Time Calculation (min): 34 min Charges:  OT General Charges $OT Visit: 1 Visit OT Evaluation $  OT Eval Moderate Complexity: 1 Mod OT Treatments $Self  Care/Home Management : 8-22 mins  Adrianne BROCKS, OT  Acute Rehabilitation Services Office [phone] Secure chat preferred   Adrianne GORMAN Savers 08/03/2024, 9:41 AM

## 2024-08-03 NOTE — NC FL2 (Signed)
 [place]  MEDICAID FL2 LEVEL OF CARE FORM     IDENTIFICATION  Patient Name: Beth [name] Birthdate: 07-29-1946 Sex: female Admission Date (Current Location): 08/01/2024  [facility] and [place] Number:  Producer, television/film/video and Address:  [facility], [address], [place], [place] [address]      Provider Number: [id number]  Attending Physician Name and Address:  Darci Pore, MD  Relative Name and Phone Number:  Lenon Craze (Daughter)  [phone]    Current Level of Care: Hospital Recommended Level of Care: Skilled Nursing Facility Prior Approval Number:    Date Approved/Denied:   PASRR Number: [id number]  Discharge Plan: SNF    Current Diagnoses: Patient Active Problem List   Diagnosis Date Noted   Acute kidney injury superimposed on chronic kidney disease (HCC) 08/01/2024   Prolonged QT interval 08/01/2024   Elevated troponin 08/01/2024   Hypertensive urgency 08/01/2024   Acute metabolic encephalopathy 08/01/2024   Otitis externa 08/01/2024   Heart failure with preserved ejection fraction (HCC) 08/01/2024   Sore throat 08/01/2024   Debility 08/01/2024   Stage 4 chronic kidney disease (HCC) 07/25/2024   Hospital discharge follow-up 07/25/2024   Stage 2 chronic kidney disease 07/25/2024   Uncontrolled hypertension 07/25/2024   Neck pain, musculoskeletal 07/25/2024   Acute suppurative otitis media of left ear without spontaneous rupture of tympanic membrane 07/25/2024   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 12/31/2023   CHF (congestive heart failure) (HCC) 08/20/2023   Murmur 09/24/2021   Hamstring tendinitis of right thigh 08/22/2019   Osteoarthritis of both sacroiliac joints (HCC) 08/22/2019   Diabetes type 2, controlled (HCC) 03/07/2016   Gout    Essential hypertension    Enuresis 11/19/2015   HLD (hyperlipidemia) 01/04/2014   Anemia, iron  deficiency 12/30/2011   Constipation 04/04/2009    TUBULOVILLOUS ADENOMA, COLON 04/03/2009    Orientation RESPIRATION BLADDER Height & Weight     Self  Normal Incontinent, External catheter Weight: 224 lb 3.3 oz (101.7 kg) Height:  5' 5 (165.1 cm)  BEHAVIORAL SYMPTOMS/MOOD NEUROLOGICAL BOWEL NUTRITION STATUS      Continent Diet (see discharge summary)  AMBULATORY STATUS COMMUNICATION OF NEEDS Skin   Extensive Assist Verbally Normal                       Personal Care Assistance Level of Assistance  Bathing, Feeding, Dressing Bathing Assistance: Maximum assistance Feeding assistance: Limited assistance Dressing Assistance: Maximum assistance     Functional Limitations Info  Sight, Hearing, Speech Sight Info: Adequate Hearing Info: Impaired (L ear drainage) Speech Info: Adequate    SPECIAL CARE FACTORS FREQUENCY  PT (By licensed PT), OT (By licensed OT), Speech therapy     PT Frequency: 5x week OT Frequency: 5x week     Speech Therapy Frequency: 3x week      Contractures Contractures Info: Not present    Additional Factors Info  Code Status, Allergies, Isolation Precautions Code Status Info: Full Allergies Info: Cefdinir, Aleve (Naproxen Sodium), Asa (Aspirin ), Ciprofloxacin , Clonidine  Derivatives, Shellfish Allergy     Isolation Precautions Info: Droplet pre     Current Medications (08/03/2024):  This is the current hospital active medication list Current Facility-Administered Medications  Medication Dose Route Frequency Provider Last Rate Last Admin   0.9 %  sodium chloride  infusion   Intravenous Continuous Darci Pore, MD 75 mL/hr at 08/03/24 0521 New Bag at 08/03/24 0521   acetaminophen  (TYLENOL ) tablet 650 mg  650 mg Oral Q6H PRN Smith, Rondell A, MD   650 mg at 08/03/24 0522   Or   acetaminophen  (TYLENOL ) suppository 650 mg  650 mg Rectal Q6H PRN Smith, Rondell A, MD       albuterol  (PROVENTIL ) (2.5 MG/3ML) 0.083% nebulizer solution 2.5 mg  2.5 mg Nebulization Q6H PRN Smith, Rondell A, MD        aspirin  EC tablet 81 mg  81 mg Oral Daily Zhao, Xika, NP   81 mg at 08/03/24 0815   atorvastatin  (LIPITOR ) tablet 80 mg  80 mg Oral QPM Zhao, Xika, NP   80 mg at 08/02/24 1834   ciprofloxacin -dexamethasone  (CIPRODEX ) 0.3-0.1 % OTIC (EAR) suspension 4 drop  4 drop Left EAR BID Claudene Reeves A, MD   4 drop at 08/03/24 0824   dextrose  50 % solution 50 mL  1 ampule Intravenous PRN Claudene Reeves LABOR, MD       diltiazem  (CARDIZEM  CD) 24 hr capsule 240 mg  240 mg Oral QPM Smith, Rondell A, MD   240 mg at 08/02/24 1834   heparin  injection 5,000 Units  5,000 Units Subcutaneous Q8H Henry Shaver B, NP   5,000 Units at 08/03/24 1336   hydrALAZINE  (APRESOLINE ) injection 10 mg  10 mg Intravenous Q4H PRN Smith, Rondell A, MD   10 mg at 08/03/24 0531   hydrALAZINE  (APRESOLINE ) tablet 100 mg  100 mg Oral TID Smith, Rondell A, MD   100 mg at 08/03/24 0816   HYDROmorphone  (DILAUDID ) injection 0.5 mg  0.5 mg Intravenous Q3H PRN Darci Pore, MD       isosorbide  mononitrate (IMDUR ) 24 hr tablet 30 mg  30 mg Oral Daily Henry Shaver B, NP   30 mg at 08/03/24 1130   nystatin  (MYCOSTATIN ) 100000 UNIT/ML suspension 500,000 Units  5 mL Oral QID Smith, Rondell A, MD   500,000 Units at 08/03/24 1334   piperacillin -tazobactam (ZOSYN ) IVPB 3.375 g  3.375 g Intravenous Q8H Smith, Rondell A, MD 12.5 mL/hr at 08/03/24 1338 3.375 g at 08/03/24 1338   sodium chloride  flush (NS) 0.9 % injection 3 mL  3 mL Intravenous Q12H Smith, Rondell A, MD   3 mL at 08/03/24 0827   trimethobenzamide  (TIGAN ) injection 200 mg  200 mg Intramuscular Q6H PRN Smith, Rondell A, MD   200 mg at 08/01/24 1549     Discharge Medications: Please see discharge summary for a list of discharge medications.  Relevant Imaging Results:  Relevant Lab Results:   Additional Information SSN: [id number]  Jared Whorley C Addysyn Fern, LCSWA

## 2024-08-03 NOTE — Progress Notes (Signed)
 Speech Language Pathology Treatment: Dysphagia  Patient Details Name: Beth [name] MRN: [id number] DOB: Dec 03, 1946 Today's Date: 08/03/2024 Time: 9055-9044 SLP Time Calculation (min) (ACUTE ONLY): 11 min  Assessment / Plan / Recommendation Clinical Impression  Pt still a bit drowsy but able to open her eyes and engage with clinician today. Consumed oatmeal, peaches, and water  from tray with no indication of a dysphagia. Oral manipulation/mastication WFL; swallow appears to be brisk with no s/s of aspiration; voice clear, no cough. Continue current diet with support for self-feeding as needed.   No dysphagia. SLP will sign off. D/W family at bedside.   HPI HPI: 78 yo female adm 08/01/24 with HA, HTN and NSTEMI. Presents with N/V, lethargy. Granddaughter reports sore throat due to repetitive vomiting prior to admission, but no coughing with PO intake. PMhx: HTN, HLD, dCHF, CVA, T2DM, CKD, Rt TKA with infection and replacement      SLP Plan  All goals met          Recommendations  Diet recommendations: Regular;Thin liquid Liquids provided via: Straw;Cup Medication Administration: Whole meds with liquid Supervision: Patient able to self feed;Staff to assist with self feeding Postural Changes and/or Swallow Maneuvers: Seated upright 90 degrees                  Oral care BID     Dysphagia, unspecified (R13.10)     All goals met   Beth Mcneely L. Vona, MA CCC/SLP Clinical Specialist - Acute Care SLP Acute Rehabilitation Services Office number [phone]   Beth [name]  08/03/2024, 9:55 AM

## 2024-08-03 NOTE — TOC Initial Note (Addendum)
 Transition of Care [facility]) - Initial/Assessment Note    Patient Details  Name: Beth [name] MRN: [id number] Date of Birth: 08-22-1946  Transition of Care [facility]) CM/SW Contact:    [name], LCSWA Phone Number: 08/03/2024, 1:22 PM  Clinical Narrative:    Patient oriented x1 at this time. CSW spoke with patients daughter, [name], about PT recs for SNF. [name] is agreeable and would like CSW to rend referral to [facility]. [name] gave CSW permission to send referral to other SNFs to maximize options. CSW explained insurance coverage of about 14-20 days at 100%. [name] verbally confirmed her understanding. [name] provided CSW with her email to send SNF options to (email: [email or web address])   3:41 PM CSW emailed bed offers w/ medicare.gov ratings to [place].   CSW will continue to follow.    Expected Discharge Plan: Skilled Nursing Facility Barriers to Discharge: Continued Medical Work up, SNF Pending bed offer, Insurance Authorization   Patient Goals and CMS Choice Patient states their goals for this hospitalization and ongoing recovery are:: To go to rehab          Expected Discharge Plan and Services In-house Referral: Clinical Social Work     Living arrangements for the past 2 months: Single Family Home                                      Prior Living Arrangements/Services Living arrangements for the past 2 months: Single Family Home Lives with:: Self Patient language and need for interpreter reviewed:: No Do you feel safe going back to the place where you live?: Yes      Need for Family Participation in Patient Care: Yes (Comment) Care giver support system in place?: Yes (comment)   Criminal Activity/Legal Involvement Pertinent to Current Situation/Hospitalization: No - Comment as needed  Activities of Daily Living   ADL Screening (condition at time of admission) Independently performs ADLs?: No Does the patient have a NEW difficulty  with bathing/dressing/toileting/self-feeding that is expected to last >3 days?: No Does the patient have a NEW difficulty with getting in/out of bed, walking, or climbing stairs that is expected to last >3 days?: No Does the patient have a NEW difficulty with communication that is expected to last >3 days?: No Is the patient deaf or have difficulty hearing?: No Does the patient have difficulty seeing, even when wearing glasses/contacts?: No Does the patient have difficulty concentrating, remembering, or making decisions?: No  Permission Sought/Granted Permission sought to share information with : Family Supports Permission granted to share information with : No (Paitent oriented x1, family contact in chart)  Share Information with NAME: Anderson,Joanna  Permission granted to share info w AGENCY: SNF  Permission granted to share info w Relationship: daughter  Permission granted to share info w Contact Information: [phone]  Emotional Assessment Appearance:: Appears stated age Attitude/Demeanor/Rapport: Lethargic Affect (typically observed): Stable Orientation: : Oriented to Self Alcohol / Substance Use: Not Applicable Psych Involvement: No (comment)  Admission diagnosis:  Elevated troponin [R79.89] Acute kidney injury superimposed on chronic kidney disease (HCC) [N17.9, N18.9] Patient Active Problem List   Diagnosis Date Noted   Acute kidney injury superimposed on chronic kidney disease (HCC) 08/01/2024   Prolonged QT interval 08/01/2024   Elevated troponin 08/01/2024   Hypertensive urgency 08/01/2024   Acute metabolic encephalopathy 08/01/2024   Otitis externa 08/01/2024   Heart failure with preserved ejection fraction (HCC)  08/01/2024   Sore throat 08/01/2024   Debility 08/01/2024   Stage 4 chronic kidney disease (HCC) 07/25/2024   Hospital discharge follow-up 07/25/2024   Stage 2 chronic kidney disease 07/25/2024   Uncontrolled hypertension 07/25/2024   Neck pain,  musculoskeletal 07/25/2024   Acute suppurative otitis media of left ear without spontaneous rupture of tympanic membrane 07/25/2024   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 [place] [address] Phone: [phone] Fax: [phone]  [facility] [address] [place], [place] - [address] [place] [address] Phone: [phone] Fax: [phone]  [facility] - [place], [place] - [address] [place] [address] Phone: [phone] Fax: [phone]     Social Drivers of Health (SDOH) Social History: SDOH Screenings   Food Insecurity: No Food Insecurity (08/01/2024)  Housing: Low Risk  (08/01/2024)  Transportation Needs: No Transportation Needs (08/01/2024)  Recent Concern: Transportation Needs - Unmet Transportation Needs (07/20/2024)   Received from [facility]  Utilities: Not At Risk (08/01/2024)  Alcohol Screen: Low Risk  (12/30/2023)  Depression (PHQ2-9): Medium Risk (07/20/2024)  Financial Resource Strain: Low Risk  (07/20/2024)   Received from [facility]  Physical Activity: Inactive (07/20/2024)   Received from [facility]  Social Connections: Moderately Integrated (08/01/2024)  Recent Concern: Social Connections - Socially Isolated (07/20/2024)   Received from [facility]  Stress: No Stress Concern Present (07/20/2024)    Received from [facility]  Tobacco Use: Low Risk  (08/02/2024)  Health Literacy: Low Risk  (07/20/2024)   Received from [facility]   SDOH Interventions:     Readmission Risk Interventions     No data to display

## 2024-08-04 DIAGNOSIS — R7989 Other specified abnormal findings of blood chemistry: Secondary | ICD-10-CM | POA: Diagnosis not present

## 2024-08-04 LAB — CBC
HCT: 38.9 % (ref 36.0–46.0)
Hemoglobin: 12.3 g/dL (ref 12.0–15.0)
MCH: 26.2 pg (ref 26.0–34.0)
MCHC: 31.6 g/dL (ref 30.0–36.0)
MCV: 82.8 fL (ref 80.0–100.0)
Platelets: 291 K/uL (ref 150–400)
RBC: 4.7 MIL/uL (ref 3.87–5.11)
RDW: 15.5 % (ref 11.5–15.5)
WBC: 10.6 K/uL — ABNORMAL HIGH (ref 4.0–10.5)
nRBC: 0 % (ref 0.0–0.2)

## 2024-08-04 LAB — GLUCOSE, CAPILLARY
Glucose-Capillary: 154 mg/dL — ABNORMAL HIGH (ref 70–99)
Glucose-Capillary: 220 mg/dL — ABNORMAL HIGH (ref 70–99)
Glucose-Capillary: 245 mg/dL — ABNORMAL HIGH (ref 70–99)
Glucose-Capillary: 178 mg/dL — ABNORMAL HIGH (ref 70–99)

## 2024-08-04 LAB — BASIC METABOLIC PANEL WITH GFR
Anion gap: 11 (ref 5–15)
BUN: 54 mg/dL — ABNORMAL HIGH (ref 8–23)
CO2: 22 mmol/L (ref 22–32)
Calcium: 8.5 mg/dL — ABNORMAL LOW (ref 8.9–10.3)
Chloride: 105 mmol/L (ref 98–111)
Creatinine, Ser: 3.22 mg/dL — ABNORMAL HIGH (ref 0.44–1.00)
GFR, Estimated: 14 mL/min — ABNORMAL LOW (ref >60)
Glucose, Bld: 166 mg/dL — ABNORMAL HIGH (ref 70–99)
Potassium: 3.4 mmol/L — ABNORMAL LOW (ref 3.5–5.1)
Sodium: 138 mmol/L (ref 135–145)

## 2024-08-04 MED ORDER — ISOSORBIDE MONONITRATE ER 60 MG PO TB24
60.0000 mg | ORAL_TABLET | Freq: Every day | ORAL | Status: DC
  Administered 2024-08-04 – 2024-08-07 (×4): 60 mg via ORAL
  Filled 2024-08-04 (×4): qty 1

## 2024-08-04 MED ORDER — ORAL CARE MOUTH RINSE: 15.0000 mL | OROMUCOSAL | Status: DC | PRN

## 2024-08-04 MED ORDER — POTASSIUM CHLORIDE CRYS ER 20 MEQ PO TBCR
40.0000 meq | EXTENDED_RELEASE_TABLET | Freq: Two times a day (BID) | ORAL | Status: AC
  Administered 2024-08-04 (×2): 40 meq via ORAL
  Filled 2024-08-04 (×2): qty 2

## 2024-08-04 NOTE — Progress Notes (Signed)
 Progress Note Patient: Beth [name] FMW:[id number] DOB: Dec 01, 1946 DOA: 08/01/2024     3 DOS: the patient was seen and examined on 08/04/2024   Brief hospital course: Beth [name] is a 78 y.o. female with medical history significant of hypertension, hyperlipidemia, diastolic CHF, CVA, diabetes mellitus type 2, and chronic kidney disease stage IIIb presents with elevated blood pressure, earache, throat ache, headache, nausea, and weakness for further evaluation.  Outside facility labs from 8/23 noted WBC 9.1, hemoglobin 13.5, sodium 133, potassium 3.3, chloride 95, BUN 65, creatinine 3.31, proBNP 1649,  high-sensitivity troponin 374-> 358-> 350,  and delta at  hsTrop 16->8.  CT scan of the head showed no acute intercranial abnormality with opacification of the right maxillary sinus that was new.  Chest x-ray noted mild cardiomegaly with left ventricular prominence with lungs otherwise noted to be clear.  Urinalysis was significant for protein and glucose, but no signs for infection.  CT scan of the abdomen pelvis was also done that did not show any signs of hydronephrosis or ureteral calculus. Workup notable for left ear otitis externa started on clindamycin and Rocephin  admitted to TRH service with cardiology evaluation for elevated troponin.  Assessment and Plan: Elevated troponin Acute.  Patient presented with complaints of generalized malaise and substernal chest pain.. Found to have high-sensitivity troponins at the outside facility374-> 358-> 350  and delta at  hsTrop 16->8. High-sensitivity troponin elevated> 2300 Echocardiogram 8/25 LVEF of 60 to 65%, grade 1 diastolic dysfunction, normal RV, no significant valvular disease. Cardiology advised no further cardiac intervention due to her age, kidney dysfunction, poor functional level of activity. Heparin  drip stopped. Medical therapy with Aspirin  + plavix  started.   Hypertensive urgency Held beta-blocker due to bradycardia Continue hydralazine   and Cardizem . Started imdur  per cardiology. Hold ARB/ hydrochlorothiazide  due to kidney dysfunction. Increased imdur  today for better control.    Acute metabolic encephalopathy Patient presented more lethargic and altered.  No focal deficits appreciated on physical exam. CT scan of the head unremarkable. Today she is more alert, able to answer me. Continue Neurochecks.  Heart failure with preserved ejection fraction Hold diuretic as she is not hypervolemic, and with her kidney dysfunction. Strict I&O's and daily weights Echo: Left ventricular ejection fraction, by estimation, is 60 to 65%. The  left ventricle has normal function.    Acute kidney injury superimposed on chronic kidney disease IIIb- Cr 3.22 today which is mildly improved from yesterday. Baseline creatinine previously noted to be around 1.5- 2. Monitor daily renal function. Nephrology evaluation- no further recommendations for changes   Otitis externa At the outside facility patient had been given clindamycin and Rocephin . Ciprofloxacin  eardrops twice daily Zosyn  IV in the acute setting as unable to give Cipro  due to prolonged QT interval   Sore throat Aspiration precautions with elevation head of the bed Nystatin  swish and swallow ordered. SLP advised regular diet.   Prolonged QT interval QT prolonged at 508. Correct electrolyte abnormalities Avoid further QT prolonging medications.  Hypokalemia- K+ 3.4 Oral replacement ordered.   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 PT/ OT on board, advised SNF placement.  Obesity Class II- Diet, exercise and weight reductions.     Out of bed to chair. Incentive spirometry. Nursing supportive  care. Fall, aspiration precautions.  Diet:  Diet Orders (From admission, onward)     Start     Ordered   08/01/24 0727  Diet heart healthy/carb modified Room service appropriate? Yes; Fluid consistency: Thin  Diet effective now       Question Answer Comment  Diet-HS Snack? Nothing   Room service appropriate? Yes   Fluid consistency: Thin      08/01/24 0728           DVT prophylaxis: heparin  injection 5,000 Units Start: 08/02/24 1400  Level of care: Telemetry Cardiac   Code Status: Full Code  Subjective: Patient reports doing well today. Her son is at bedside and they are evaluating a SNF today to decide where to go.   Physical Exam: Vitals:   08/04/24 0013 08/04/24 0500 08/04/24 0606 08/04/24 0700  BP: 137/67  (!) 152/72 (!) 148/89  Pulse: (!) 54  (!) 56 (!) 52  Resp: 15  20 14   Temp: 98.2 F (36.8 C)  98.6 F (37 C) 98.3 F (36.8 C)  TempSrc: Oral  Oral Oral  SpO2: 97%  95% 98%  Weight:  101.1 kg    Height:       General - Elderly female, no acute distress. Lung - Clear, basal rales, rhonchi, no wheezes. Heart - S1, S2 heard, no murmurs, rubs, trace pedal edema. Neuro - alert, awake and oriented, non focal exam. Skin - Warm and dry. Left ear dressing noted.  Data Reviewed:    Latest Ref Rng & Units 08/04/2024    2:29 AM 08/03/2024    2:41 AM 08/02/2024   12:57 AM  CBC  WBC 4.0 - 10.5 K/uL 10.6  9.3  10.4   Hemoglobin 12.0 - 15.0 g/dL 87.6  86.8  85.9   Hematocrit 36.0 - 46.0 % 38.9  41.1  43.8   Platelets 150 - 400 K/uL 291  303  318       Latest Ref Rng & Units 08/04/2024    2:29 AM 08/03/2024    2:41 AM 08/02/2024   12:57 AM  BMP  Glucose 70 - 99 mg/dL 833  840  855   BUN 8 - 23 mg/dL 54  53  48   Creatinine 0.44 - 1.00 mg/dL 6.77  6.54  7.23   Sodium 135 - 145 mmol/L 138  141  136   Potassium 3.5 - 5.1 mmol/L 3.4  3.2  3.3   Chloride 98 - 111 mmol/L 105  102  96   CO2 22 - 32 mmol/L 22  21  22    Calcium  8.9 - 10.3 mg/dL 8.5  8.7  8.9    No  results found.   Family Communication: son at bedside   Disposition: Status is: Inpatient Remains inpatient appropriate because: , AKI, elevated trop, debility.  Planned Discharge Destination: Home with Home Health     Time spent: 55 minutes  Author: [name], MD 08/04/2024 8:41 AM Secure chat 7am to 7pm For on call review [email or web address].

## 2024-08-04 NOTE — Progress Notes (Signed)
 Nephrology Follow-Up Consult note   Assessment/Recommendations: Beth [name] is a/an 78 y.o. female with a past medical history significant for HTN diastolic HF, CVA, DM2, CKD 3 who presents to [facility] as transfer with malaise.     Non-Oliguric AKI on CKD(improved): Likely secondary to hypotension/relative hypotension  Renal function somewhat improved today. -Chart reviewed: (medications acceptable, does not appear to have been exposed to nephrotoxins with imaging. Had episodes of significant hypotension).   -Continue to monitor daily Cr, Dose meds for GFR -Monitor Daily I/Os, Daily weight  -Maintain MAP>65 for optimal renal perfusion.  -Avoid nephrotoxic medications including NSAIDs -Use synthetic opioids (Fentanyl /Dilaudid ) if needed -Currently no indication for HD  Volume Status: Appears euvolemic on exam. Based on our examination and review of available imaging, our recommendation is as above.  Hypokalemia: Improved.  Defer to primary.   Recommendations conveyed to primary service.    [name] Bridgitte [name] 08/04/2024 7:22 AM  ___________________________________________________________  CC: Malaise  Interval History/Subjective: No major events reported overnight.  Seen in bed.  Children at bedside.  Having some chronic leg and ear pain.  States she is urinating well.  More conversational and interactive today.  P.o. intake increasing.  Medications:  Current Facility-Administered Medications  Medication Dose Route Frequency Provider Last Rate Last Admin   acetaminophen  (TYLENOL ) tablet 650 mg  650 mg Oral Q6H PRN Smith, Rondell A, MD   650 mg at 08/04/24 9771   Or   acetaminophen  (TYLENOL ) suppository 650 mg  650 mg Rectal Q6H PRN Smith, Rondell A, MD       albuterol  (PROVENTIL ) (2.5 MG/3ML) 0.083% nebulizer solution 2.5 mg  2.5 mg Nebulization Q6H PRN Smith, Rondell A, MD       aspirin  EC tablet 81 mg  81 mg Oral Daily Zhao, Xika, NP   81 mg at 08/03/24  0815   atorvastatin  (LIPITOR ) tablet 80 mg  80 mg Oral QPM Zhao, Xika, NP   80 mg at 08/03/24 1718   ciprofloxacin -dexamethasone  (CIPRODEX ) 0.3-0.1 % OTIC (EAR) suspension 4 drop  4 drop Left EAR BID Claudene Reeves A, MD   4 drop at 08/03/24 2128   dextrose  50 % solution 50 mL  1 ampule Intravenous PRN Claudene Reeves A, MD       diltiazem  (CARDIZEM  CD) 24 hr capsule 240 mg  240 mg Oral QPM Smith, Rondell A, MD   240 mg at 08/03/24 1718   diphenhydrAMINE  (BENADRYL ) capsule 25 mg  25 mg Oral Q8H PRN Darci Pore, MD       heparin  injection 5,000 Units  5,000 Units Subcutaneous Q8H Henry Shaver B, NP   5,000 Units at 08/04/24 0600   hydrALAZINE  (APRESOLINE ) injection 10 mg  10 mg Intravenous Q4H PRN Smith, Rondell A, MD   10 mg at 08/03/24 0531   hydrALAZINE  (APRESOLINE ) tablet 100 mg  100 mg Oral TID Smith, Rondell A, MD   100 mg at 08/03/24 2123   HYDROmorphone  (DILAUDID ) injection 0.5 mg  0.5 mg Intravenous Q3H PRN Darci Pore, MD       isosorbide  mononitrate (IMDUR ) 24 hr tablet 30 mg  30 mg Oral Daily Henry Shaver B, NP   30 mg at 08/03/24 1130   nystatin  (MYCOSTATIN ) 100000 UNIT/ML suspension 500,000 Units  5 mL Oral QID Smith, Rondell A, MD   500,000 Units at 08/03/24 2125   Oral care mouth rinse  15 mL Mouth Rinse PRN Sreeram, Narendranath, MD       piperacillin -tazobactam (ZOSYN )  IVPB 3.375 g  3.375 g Intravenous Q8H Smith, Rondell A, MD 12.5 mL/hr at 08/04/24 0602 3.375 g at 08/04/24 0602   potassium chloride  SA (KLOR-CON  M) CR tablet 20 mEq  20 mEq Oral BID Darci Pore, MD   20 mEq at 08/03/24 2122   sodium chloride  flush (NS) 0.9 % injection 3 mL  3 mL Intravenous Q12H Smith, Rondell A, MD   3 mL at 08/03/24 2127   trimethobenzamide  (TIGAN ) injection 200 mg  200 mg Intramuscular Q6H PRN Claudene Reeves A, MD   200 mg at 08/01/24 1549      Review of Systems: 10 systems reviewed and negative except per interval history/subjective  Physical  Exam: Vitals:   08/04/24 0013 08/04/24 0606  BP: 137/67 (!) 152/72  Pulse: (!) 54 (!) 56  Resp: 15 20  Temp: 98.2 F (36.8 C) 98.6 F (37 C)  SpO2: 97% 95%   No intake/output data recorded.  Intake/Output Summary (Last 24 hours) at 08/04/2024 9277 Last data filed at 08/04/2024 0555 Gross per 24 hour  Intake 1172.72 ml  Output --  Net 1172.72 ml   General: Elderly-appearing, no acute distress CV: RRR, no peripheral edema Lungs: BLBS, normal work of breathing Abd: soft, non-tender, non-distended Psych: alert, engaged, appropriate mood and affect Musculoskeletal: no obvious deformities Neuro: normal speech, no gross focal deficits    Test Results I personally reviewed new and old clinical labs and radiology tests Lab Results  Component Value Date   NA 138 08/04/2024   K 3.4 (L) 08/04/2024   CL 105 08/04/2024   CO2 22 08/04/2024   BUN 54 (H) 08/04/2024   CREATININE 3.22 (H) 08/04/2024   CALCIUM  8.5 (L) 08/04/2024   ALBUMIN 2.9 (L) 08/01/2024    CBC Recent Labs  Lab 08/01/24 0924 08/02/24 0057 08/03/24 0241 08/04/24 0229  WBC 7.7 10.4 9.3 10.6*  NEUTROABS 5.8  --   --   --   HGB 12.8 14.0 13.1 12.3  HCT 40.3 43.8 41.1 38.9  MCV 82.8 83.0 82.5 82.8  PLT 269 318 303 291

## 2024-08-04 NOTE — Progress Notes (Signed)
 Physical Therapy Treatment Patient Details Name: Beth [name] MRN: [id number] DOB: 30-Mar-1946 Today's Date: 08/04/2024   History of Present Illness 78 yo female adm 08/01/24 with HA, HTN and NSTEMI. PMhx: HTN, HLD, dCHF, CVA, T2DM, CKD, Rt TKA with infection and replacement    PT Comments  Pt alert, eyes open and answering questions throughout session with some delay and errors with daughter present to correct answers. Pt able to assist with getting to EOB and standing but not strong enough to stand or squat pivot to chair. Will plan to attempt lateral transfer next session. Encouraged continued assist of staff for bed level mobility and to complete UB and LB HEP in bed with rolled washcloths provided for hand squeezes. Will continue to follow. Patient will benefit from continued inpatient follow up therapy, <3 hours/day     If plan is discharge home, recommend the following: A lot of help with walking and/or transfers;A lot of help with bathing/dressing/bathroom;Assistance with feeding;Assist for transportation;Direct supervision/assist for financial management;Assistance with cooking/housework;Direct supervision/assist for medications management;Supervision due to cognitive status   Can travel by private vehicle     No  Equipment Recommendations  Wheelchair (measurements PT);Wheelchair cushion (measurements PT)    Recommendations for Other Services       Precautions / Restrictions Precautions Precautions: Fall;Other (comment) Recall of Precautions/Restrictions: Impaired Precaution/Restrictions Comments: Rt knee spacer     Mobility  Bed Mobility Overal bed mobility: Needs Assistance Bed Mobility: Supine to Sit, Sit to Supine     Supine to sit: Mod assist, +2 for safety/equipment, HOB elevated, Used rails Sit to supine: Max assist   General bed mobility comments: mod +2 assist with use of pad and physical assist to pivot LB to EOB with hand held assist to elevate trunk from  surface. REturn to bed with assist to lift legs to surface, max +2 to slide up in bed    Transfers Overall transfer level: Needs assistance   Transfers: Sit to/from Stand Sit to Stand: Mod assist, +2 physical assistance           General transfer comment: mod +2 to stand from EOB with left knee blocked and physical assist of belt and pad to stand. pt able to clear hips but could not fully extend trunk or hips into standing x 2 trials. EOB min assist with pad to scoot toward [facility]    Ambulation/Gait               General Gait Details: unable   Stairs             Wheelchair Mobility     Tilt Bed    Modified Rankin (Stroke Patients Only)       Balance Overall balance assessment: Needs assistance Sitting-balance support: Bilateral upper extremity supported, Feet supported Sitting balance-[name]: Fair     Standing balance support: Bilateral upper extremity supported, Reliant on assistive device for balance Standing balance-[name]: Zero Standing balance comment: RW and physical assist in standing                            Communication Communication Communication: No apparent difficulties  Cognition Arousal: Alert Behavior During Therapy: Flat affect   PT - Cognitive impairments: Orientation, Awareness, Memory, Problem solving, Safety/Judgement                         Following commands: Impaired Following commands impaired: Follows one step  commands inconsistently, Follows one step commands with increased time    Cueing Cueing Techniques: Verbal cues, Gestural cues, Tactile cues  Exercises      General Comments        Pertinent Vitals/Pain Pain Assessment Pain Assessment: Faces Faces Pain Scale: Hurts little more Pain Location: back and legs with movement Pain Descriptors / Indicators: Grimacing, Aching, Sore Pain Intervention(s): Limited activity within patient's tolerance, Monitored during session,  Repositioned    Home Living                          Prior Function            PT Goals (current goals can now be found in the care plan section) Progress towards PT goals: Progressing toward goals    Frequency    Min 2X/week      PT Plan      Co-evaluation              AM-PAC PT 6 Clicks Mobility   Outcome Measure  Help needed turning from your back to your side while in a flat bed without using bedrails?: A Lot Help needed moving from lying on your back to sitting on the side of a flat bed without using bedrails?: Total Help needed moving to and from a bed to a chair (including a wheelchair)?: Total Help needed standing up from a chair using your arms (e.g., wheelchair or bedside chair)?: Total Help needed to walk in hospital room?: Total Help needed climbing 3-5 steps with a railing? : Total 6 Click Score: 7    End of Session Equipment Utilized During Treatment: Gait belt Activity Tolerance: Patient tolerated treatment well Patient left: in bed;with bed alarm set;with call bell/phone within reach;with family/visitor present Nurse Communication: Mobility status;Need for lift equipment PT Visit Diagnosis: Other abnormalities of gait and mobility (R26.89);Difficulty in walking, not elsewhere classified (R26.2);Muscle weakness (generalized) (M62.81)     Time: 9244-9174 PT Time Calculation (min) (ACUTE ONLY): 30 min  Charges:    $Therapeutic Activity: 23-37 mins PT General Charges $$ ACUTE PT VISIT: 1 Visit                     Lenoard SQUIBB, PT Acute Rehabilitation Services Office: [phone]    Lenoard NOVAK Ilani Otterson 08/04/2024, 10:15 AM

## 2024-08-04 NOTE — Inpatient Diabetes Management (Signed)
 Inpatient Diabetes Program Recommendations  AACE/ADA: New Consensus Statement on Inpatient Glycemic Control (2015)  Target Ranges:  Prepandial:   less than 140 mg/dL      Peak postprandial:   less than 180 mg/dL (1-2 hours)      Critically ill patients:  140 - 180 mg/dL    Latest Reference Range & Units 08/03/24 06:22 08/03/24 11:00 08/03/24 16:09 08/03/24 20:57  Glucose-Capillary 70 - 99 mg/dL 845 (H) 818 (H) 747 (H) 205 (H)  (H): Data is abnormally high    History: Type 2 Diabetes  Home DM Meds: Jardiance  10 mg daily (NOT taking)  Current Orders: Checking CBGs ac/hs    MD- Note CBGs >200 yest afternoon  No Novolog  SSI currently ordered  Please consider adding Novolog  0-6 units TID AC     --Will follow patient during hospitalization--  Adina Rudolpho Arrow RN, MSN, CDCES Diabetes Coordinator Inpatient Glycemic Control Team Team Pager: [phone] (8a-5p)

## 2024-08-04 NOTE — TOC Progression Note (Addendum)
 Transition of Care [facility]) - Progression Note    Patient Details  Name: Beth [name] MRN: [id number] Date of Birth: Jul 31, 1946  [facility] CM/SW Contact  [name], [place] Phone Number: 08/04/2024, 11:06 AM  Clinical Narrative:   CSW spoke with patients daughter, [name], about SNF bed choice. [name] inquired about [facility], CSW informed her that [facility] has denied at this time. [name] stated patient has previously been to [facility] and would like patient to go to [facility] at this time. [name] inquired about other [facility] bed offers. CSW stated [name] and [facility] are the only current bed offers for [place]. [name] stated she will visit [name]. CSW to start auth for [facility] rehab as that is family's first choice.   11:29 AM CSW spoke with Jori with [name] and started insurance authorization for SNF and PTAR.   12:43 PM [name] called CSW to inform family may want [name] instead. [name] stated family will ask patient and get back to CSW.   4:10 PM [name] called CSW and stated they would like patient to go to [facility] at discharge. CSW left voicemail for Jori with [name] to switch insurance auth for [employer] to [place]. CSW updated [name] with familys choice.   CSW will continue to follow.    Expected Discharge Plan: Skilled Nursing Facility Barriers to Discharge: Continued Medical Work up, SNF Pending bed offer, English as a second language teacher               Expected Discharge Plan and Services In-house Referral: Clinical Social Work     Living arrangements for the past 2 months: Single Family Home                                       Social Drivers of Health (SDOH) Interventions SDOH Screenings   Food Insecurity: No Food Insecurity (08/01/2024)  Housing: Low Risk  (08/01/2024)  Transportation Needs: No Transportation Needs (08/01/2024)  Recent Concern: Transportation Needs - Unmet Transportation Needs (07/20/2024)   Received from [facility]  Utilities: Not At Risk  (08/01/2024)  Alcohol Screen: Low Risk  (12/30/2023)  Depression (PHQ2-9): Medium Risk (07/20/2024)  Financial Resource Strain: Low Risk  (07/20/2024)   Received from [facility]  Physical Activity: Inactive (07/20/2024)   Received from [facility]  Social Connections: Moderately Integrated (08/01/2024)  Recent Concern: Social Connections - Socially Isolated (07/20/2024)   Received from [facility]  Stress: No Stress Concern Present (07/20/2024)   Received from [facility]  Tobacco Use: Low Risk  (08/02/2024)  Health Literacy: Low Risk  (07/20/2024)   Received from [facility]    Readmission Risk Interventions     No data to display

## 2024-08-04 NOTE — Plan of Care (Signed)

## 2024-08-05 DIAGNOSIS — R7989 Other specified abnormal findings of blood chemistry: Secondary | ICD-10-CM | POA: Diagnosis not present

## 2024-08-05 LAB — CBC
HCT: 35.7 % — ABNORMAL LOW (ref 36.0–46.0)
Hemoglobin: 11.2 g/dL — ABNORMAL LOW (ref 12.0–15.0)
MCH: 26.2 pg (ref 26.0–34.0)
MCHC: 31.4 g/dL (ref 30.0–36.0)
MCV: 83.4 fL (ref 80.0–100.0)
Platelets: 259 K/uL (ref 150–400)
RBC: 4.28 MIL/uL (ref 3.87–5.11)
RDW: 15.8 % — ABNORMAL HIGH (ref 11.5–15.5)
WBC: 9.8 K/uL (ref 4.0–10.5)
nRBC: 0 % (ref 0.0–0.2)

## 2024-08-05 LAB — BASIC METABOLIC PANEL WITH GFR
Anion gap: 13 (ref 5–15)
BUN: 56 mg/dL — ABNORMAL HIGH (ref 8–23)
CO2: 20 mmol/L — ABNORMAL LOW (ref 22–32)
Calcium: 8.4 mg/dL — ABNORMAL LOW (ref 8.9–10.3)
Chloride: 105 mmol/L (ref 98–111)
Creatinine, Ser: 3.5 mg/dL — ABNORMAL HIGH (ref 0.44–1.00)
GFR, Estimated: 13 mL/min — ABNORMAL LOW (ref >60)
Glucose, Bld: 166 mg/dL — ABNORMAL HIGH (ref 70–99)
Potassium: 4.2 mmol/L (ref 3.5–5.1)
Sodium: 138 mmol/L (ref 135–145)

## 2024-08-05 LAB — GLUCOSE, CAPILLARY
Glucose-Capillary: 143 mg/dL — ABNORMAL HIGH (ref 70–99)
Glucose-Capillary: 330 mg/dL — ABNORMAL HIGH (ref 70–99)
Glucose-Capillary: 201 mg/dL — ABNORMAL HIGH (ref 70–99)
Glucose-Capillary: 198 mg/dL — ABNORMAL HIGH (ref 70–99)

## 2024-08-05 MED ORDER — PIPERACILLIN-TAZOBACTAM IN DEX 2-0.25 GM/50ML IV SOLN
2.2500 g | Freq: Four times a day (QID) | INTRAVENOUS | Status: DC
  Administered 2024-08-05 – 2024-08-06 (×2): 2.25 g via INTRAVENOUS
  Filled 2024-08-05 (×4): qty 50

## 2024-08-05 NOTE — Inpatient Diabetes Management (Signed)
 Inpatient Diabetes Program Recommendations  AACE/ADA: New Consensus Statement on Inpatient Glycemic Control (2015)  Target Ranges:  Prepandial:   less than 140 mg/dL      Peak postprandial:   less than 180 mg/dL (1-2 hours)      Critically ill patients:  140 - 180 mg/dL     Latest Reference Range & Units 08/04/24 05:42 08/04/24 11:18 08/04/24 15:56 08/04/24 20:55 08/05/24 06:00 08/05/24 11:27  Glucose-Capillary 70 - 99 mg/dL 845 (H) 779 (H) 754 (H) 178 (H) 143 (H) 330 (H)   History: Type 2 Diabetes  Home DM Meds: Jardiance  10 mg daily (NOT taking)  Current Orders: Checking CBGs ac/hs   MD- Note CBGs >300  No Novolog  SSI currently ordered  Please consider adding Novolog  0-6 units TID AC   --Will follow patient during hospitalization--  Clotilda Bull RN, MSN, BC-ADM Inpatient Diabetes Coordinator Team Pager [phone] (8a-5p)\

## 2024-08-05 NOTE — Progress Notes (Signed)
 Nephrology Follow-Up Consult note   Assessment/Recommendations: Beth [name] is a/an 78 y.o. female with a past medical history significant for HTN diastolic HF, CVA, DM2, CKD 3 who presents to [facility] as transfer with malaise.    Non-Oliguric AKI on CKD (unchanged): Likely secondary to hypotension/relative hypotension  Renal function slightly worsened today, however relatively unchanged. Would continue with supportive care Consider MIVF, if decreased p.o. intake.  -Chart reviewed: (medications acceptable, does not appear to have been exposed to nephrotoxins with imaging. Had episodes of significant hypotension).   -Continue to monitor daily Cr, Dose meds for GFR -Monitor Daily I/Os, Daily weight  -Maintain MAP>65 for optimal renal perfusion.  -Avoid nephrotoxic medications including NSAIDs -Use synthetic opioids (Fentanyl /Dilaudid ) if needed -Currently no indication for HD  Volume Status: Appears euvolemic on exam. Based on our examination and review of available imaging, our recommendation is as above.  Hypokalemia: Resolved.  Defer to primary.   Recommendations conveyed to primary service.    [name] Everardo Voris  [facility] 08/05/2024 7:10 AM  ___________________________________________________________  CC: Malaise  Interval History/Subjective: No major events reported overnight.  Son at bedside.  He stated overnight.  States she is eating okay.  Seems somewhat less interactive versus yesterday.  Son was in agreement not pursuing hemodialysis, if renal function worsens in the future.  Medications:  Current Facility-Administered Medications  Medication Dose Route Frequency Provider Last Rate Last Admin   acetaminophen  (TYLENOL ) tablet 650 mg  650 mg Oral Q6H PRN Smith, Rondell A, MD   650 mg at 08/05/24 9487   Or   acetaminophen  (TYLENOL ) suppository 650 mg  650 mg Rectal Q6H PRN Smith, Rondell A, MD       albuterol  (PROVENTIL ) (2.5 MG/3ML) 0.083% nebulizer  solution 2.5 mg  2.5 mg Nebulization Q6H PRN Smith, Rondell A, MD       aspirin  EC tablet 81 mg  81 mg Oral Daily Zhao, Xika, NP   81 mg at 08/04/24 0910   atorvastatin  (LIPITOR ) tablet 80 mg  80 mg Oral QPM Zhao, Xika, NP   80 mg at 08/04/24 1736   ciprofloxacin -dexamethasone  (CIPRODEX ) 0.3-0.1 % OTIC (EAR) suspension 4 drop  4 drop Left EAR BID Claudene Reeves A, MD   4 drop at 08/04/24 2125   dextrose  50 % solution 50 mL  1 ampule Intravenous PRN Claudene Reeves A, MD       diltiazem  (CARDIZEM  CD) 24 hr capsule 240 mg  240 mg Oral QPM Smith, Rondell A, MD   240 mg at 08/04/24 1736   heparin  injection 5,000 Units  5,000 Units Subcutaneous Q8H Henry Shaver B, NP   5,000 Units at 08/05/24 9485   hydrALAZINE  (APRESOLINE ) tablet 100 mg  100 mg Oral TID Smith, Rondell A, MD   100 mg at 08/04/24 2121   HYDROmorphone  (DILAUDID ) injection 0.5 mg  0.5 mg Intravenous Q3H PRN Darci Pore, MD       isosorbide  mononitrate (IMDUR ) 24 hr tablet 60 mg  60 mg Oral Daily Lenon Marien CROME, MD   60 mg at 08/04/24 0910   nystatin  (MYCOSTATIN ) 100000 UNIT/ML suspension 500,000 Units  5 mL Oral QID Smith, Rondell A, MD   500,000 Units at 08/04/24 2127   Oral care mouth rinse  15 mL Mouth Rinse PRN Darci Pore, MD       piperacillin -tazobactam (ZOSYN ) IVPB 3.375 g  3.375 g Intravenous Q8H Smith, Rondell A, MD 12.5 mL/hr at 08/05/24 0549 Infusion Verify at 08/05/24 0549   sodium  chloride flush (NS) 0.9 % injection 3 mL  3 mL Intravenous Q12H Smith, Rondell A, MD   3 mL at 08/04/24 2125   trimethobenzamide  (TIGAN ) injection 200 mg  200 mg Intramuscular Q6H PRN Claudene Reeves A, MD   200 mg at 08/01/24 1549      Review of Systems: 10 systems reviewed and negative except per interval history/subjective  Physical Exam: Vitals:   08/04/24 2355 08/05/24 0459  BP: 134/68 (!) 171/89  Pulse: (!) 51 (!) 59  Resp: 20 17  Temp: 98.7 F (37.1 C) 98.9 F (37.2 C)  SpO2: 93% 98%   No intake/output  data recorded.  Intake/Output Summary (Last 24 hours) at 08/05/2024 0710 Last data filed at 08/05/2024 0549 Gross per 24 hour  Intake 319.94 ml  Output 950 ml  Net -630.06 ml   General: Elderly-appearing, no acute distress CV: RRR, no peripheral edema Lungs: BLBS, normal work of breathing Abd: soft, non-tender, non-distended Psych: alert, engaged, appropriate mood and affect Musculoskeletal: no obvious deformities Neuro: normal speech, no gross focal deficits    Test Results I personally reviewed new and old clinical labs and radiology tests Lab Results  Component Value Date   NA 138 08/05/2024   K 4.2 08/05/2024   CL 105 08/05/2024   CO2 20 (L) 08/05/2024   BUN 56 (H) 08/05/2024   CREATININE 3.50 (H) 08/05/2024   CALCIUM  8.4 (L) 08/05/2024   ALBUMIN 2.9 (L) 08/01/2024    CBC Recent Labs  Lab 08/01/24 0924 08/02/24 0057 08/03/24 0241 08/04/24 0229 08/05/24 0217  WBC 7.7   < > 9.3 10.6* 9.8  NEUTROABS 5.8  --   --   --   --   HGB 12.8   < > 13.1 12.3 11.2*  HCT 40.3   < > 41.1 38.9 35.7*  MCV 82.8   < > 82.5 82.8 83.4  PLT 269   < > 303 291 259   < > = values in this interval not displayed.

## 2024-08-05 NOTE — Progress Notes (Signed)
 Physical Therapy Treatment Patient Details Name: Beth [name] MRN: [id number] DOB: [birth year] Today's Date: 08/05/2024   History of Present Illness 78 yo female adm 08/01/24 with HA, HTN and NSTEMI. PMhx: HTN, HLD, dCHF, CVA, T2DM, CKD, Rt TKA with infection and replacement    PT Comments  Pt pleasant and oriented to self, son and that she is in the hospital. Pt initially stating [place] as location but able to recall [place] end of session. Pt with continued need for mod-max +2 assist with all transfers and movement. Pt able to perform lateral scoot to chair today but incontinent stool prompted return to bed. REcommend lift for OOB. Will continue to follow. Patient will benefit from continued inpatient follow up therapy, <3 hours/day  147/87 sitting HR 58-62     If plan is discharge home, recommend the following: A lot of help with walking and/or transfers;A lot of help with bathing/dressing/bathroom;Assistance with feeding;Assist for transportation;Direct supervision/assist for financial management;Assistance with cooking/housework;Direct supervision/assist for medications management;Supervision due to cognitive status   Can travel by private vehicle     No  Equipment Recommendations  Wheelchair cushion (measurements PT);Hoyer lift;Wheelchair (measurements PT)    Recommendations for Other Services       Precautions / Restrictions Precautions Precautions: Fall;Other (comment) Recall of Precautions/Restrictions: Impaired Precaution/Restrictions Comments: Rt knee spacer     Mobility  Bed Mobility Overal bed mobility: Needs Assistance Bed Mobility: Rolling, Supine to Sit Rolling: Mod assist, Used rails   Supine to sit: Mod assist, +2 for safety/equipment, HOB elevated, Used rails Sit to supine: Max assist, +2 for physical assistance   General bed mobility comments: mod +2 assist with use of pad and physical assist to pivot LB to EOB with assist to elevate trunk from surface. REturn to  bed with assist to lift legs to surface max assist, max +2 to slide up in bed    Transfers Overall transfer level: Needs assistance   Transfers: Sit to/from Stand, Bed to chair/wheelchair/BSC Sit to Stand: Max assist, +2 physical assistance, From elevated surface          Lateral/Scoot Transfers: Mod assist, Max assist, +2 physical assistance General transfer comment: max +2 to stand from EOB with left knee blocked and physical assist of belt and pad to stand x 3 attempts from bed and 1 attempt from recliner. pt able to clear hips but could not fully extend trunk or hips into standing with right lean. mod +2 lateral scoot bed to recliner toward left, pt with stool incontinence in chair and max +2 to lateral scoot back to bed toward RT- max cues for sequene, trunk and UE position during lateral scoot    Ambulation/Gait               General Gait Details: unable   Stairs             Wheelchair Mobility     Tilt Bed    Modified Rankin (Stroke Patients Only)       Balance Overall balance assessment: Needs assistance Sitting-balance support: Bilateral upper extremity supported, Feet supported Sitting balance-[name]: Poor Sitting balance - Comments: CGA to min assist with tendency for posterior bias   Standing balance support: Bilateral upper extremity supported, Reliant on assistive device for balance Standing balance-[name]: Zero Standing balance comment: +2 physical assist                            Communication Communication  Communication: No apparent difficulties  Cognition Arousal: Alert Behavior During Therapy: Flat affect   PT - Cognitive impairments: Orientation, Awareness, Memory, Problem solving, Safety/Judgement   Orientation impairments: Time, Place                     Following commands: Impaired Following commands impaired: Follows one step commands with increased time    Cueing Cueing Techniques: Verbal  cues, Gestural cues, Tactile cues  Exercises      General Comments        Pertinent Vitals/Pain Pain Assessment Pain Assessment: Faces Faces Pain Scale: Hurts little more Pain Location: back and legs with movement Pain Descriptors / Indicators: Grimacing, Aching, Sore Pain Intervention(s): Limited activity within patient's tolerance, Monitored during session, Repositioned    Home Living                          Prior Function            PT Goals (current goals can now be found in the care plan section) Progress towards PT goals: Progressing toward goals    Frequency    Min 2X/week      PT Plan      Co-evaluation PT/OT/SLP Co-Evaluation/Treatment: Yes Reason for Co-Treatment: Complexity of the patient's impairments (multi-system involvement);For patient/therapist safety PT goals addressed during session: Mobility/safety with mobility        AM-PAC PT 6 Clicks Mobility   Outcome Measure  Help needed turning from your back to your side while in a flat bed without using bedrails?: A Lot Help needed moving from lying on your back to sitting on the side of a flat bed without using bedrails?: Total Help needed moving to and from a bed to a chair (including a wheelchair)?: Total Help needed standing up from a chair using your arms (e.g., wheelchair or bedside chair)?: Total Help needed to walk in hospital room?: Total Help needed climbing 3-5 steps with a railing? : Total 6 Click Score: 7    End of Session Equipment Utilized During Treatment: Gait belt Activity Tolerance: Patient tolerated treatment well Patient left: in bed;with bed alarm set;with call bell/phone within reach;with family/visitor present Nurse Communication: Mobility status;Need for lift equipment PT Visit Diagnosis: Other abnormalities of gait and mobility (R26.89);Difficulty in walking, not elsewhere classified (R26.2);Muscle weakness (generalized) (M62.81)     Time: 9045-8972 PT  Time Calculation (min) (ACUTE ONLY): 33 min  Charges:    $Therapeutic Activity: 8-22 mins PT General Charges $$ ACUTE PT VISIT: 1 Visit                     Lenoard SQUIBB, PT Acute Rehabilitation Services Office: [phone]    Porscha Axley B Elroy Schembri 08/05/2024, 10:50 AM

## 2024-08-05 NOTE — Progress Notes (Signed)
 Occupational Therapy Treatment Patient Details Name: Beth [name] MRN: [id number] DOB: 09/10/1946 Today's Date: 08/05/2024   History of present illness 78 yo female adm 08/01/24 with HA, HTN and NSTEMI. PMhx: HTN, HLD, dCHF, CVA, T2DM, CKD, Rt TKA with infection and replacement   OT comments  Pt progressing well towards goals. Improved arousal level and overall cog, but still presents with confusion regarding orientation. Improved BUE AROM, but still with significantly decreased strength. Progressed to complete lateral scoots with mod to max +2 assist depending on fatigue levels. Pt continues to be limited by cog, weakness, and decreased activity tolerance. Continue to recommend <3 hours of skilled rehab daily to optimize independence levels. Will continue to follow acutely.      If plan is discharge home, recommend the following:  Two people to help with walking and/or transfers;Two people to help with bathing/dressing/bathroom;Assistance with feeding   Equipment Recommendations  Other (comment) (Defer to next venue)    Recommendations for Other Services      Precautions / Restrictions Precautions Precautions: Fall;Other (comment) Recall of Precautions/Restrictions: Impaired Precaution/Restrictions Comments: Rt knee spacer Restrictions Weight Bearing Restrictions Per Provider Order: No       Mobility Bed Mobility Overal bed mobility: Needs Assistance Bed Mobility: Rolling, Supine to Sit Rolling: Mod assist, Used rails   Supine to sit: Mod assist, +2 for safety/equipment, HOB elevated, Used rails Sit to supine: Max assist, +2 for physical assistance   General bed mobility comments: Mod +2 for LB to EOB with cueing for sequencing, pt needing assist initally to maintain EOB posterior lean d/t pain    Transfers Overall transfer level: Needs assistance Equipment used: 2 person hand held assist Transfers: Sit to/from Stand, Bed to chair/wheelchair/BSC Sit to Stand: Max assist,  +2 physical assistance, From elevated surface          Lateral/Scoot Transfers: Mod assist, Max assist, +2 physical assistance General transfer comment: max +2 to stand from EOB with left knee blocked and physical assist of belt and pad to stand x 3 attempts from bed and 1 attempt from recliner. pt able to clear hips but could not fully extend hips. mod +2 lateral scoot bed to recliner toward left, pt with stool incontinence in chair and max +2 to lateral scoot back to bed toward RT     Balance Overall balance assessment: Needs assistance Sitting-balance support: Bilateral upper extremity supported, Feet supported Sitting balance-[name]: Poor Sitting balance - Comments: CGA to min assist occasional posterior lean   Standing balance support: Bilateral upper extremity supported, Reliant on assistive device for balance Standing balance-[name]: Zero Standing balance comment: +2 physical assist       ADL either performed or assessed with clinical judgement   ADL Overall ADL's : Needs assistance/impaired Eating/Feeding: Minimal assistance;Sitting               Upper Body Dressing : Minimal assistance;Sitting       Toilet Transfer: Moderate assistance;Maximal assistance;+2 for safety/equipment;+2 for physical assistance;Requires drop arm;Requires wide/bariatric Toilet Transfer Details (indicate cue type and reason): lateral scoots simulated in room Toileting- Clothing Manipulation and Hygiene: Total assistance;+2 for physical assistance;+2 for safety/equipment;Bed level Toileting - Clothing Manipulation Details (indicate cue type and reason): Pt incontinent of BM     Functional mobility during ADLs: Moderate assistance;Maximal assistance;+2 for safety/equipment;+2 for physical assistance General ADL Comments: Improved BUEs for UB ADLs, still limited d/t decreased activity tolerance, pain, and weakness    Extremity/Trunk Assessment Upper Extremity Assessment Upper  Extremity Assessment: Generalized weakness;RUE deficits/detail;LUE deficits/detail RUE Deficits / Details: ROM WFL 3+/5 shoulder MMT RUE Sensation: decreased light touch RUE Coordination: decreased fine motor LUE Deficits / Details: ROM WFL 3+/5 shoulder MMT LUE Sensation: decreased light touch LUE Coordination: decreased fine motor   Lower Extremity Assessment Lower Extremity Assessment: Defer to PT evaluation        Vision   Vision Assessment?: No apparent visual deficits         Communication Communication Communication: No apparent difficulties   Cognition Arousal: Alert Behavior During Therapy: Flat affect Cognition: Cognition impaired   Orientation impairments: Time, Place   Memory impairment (select all impairments): Short-term memory, Working memory Attention impairment (select first level of impairment): Sustained attention Executive functioning impairment (select all impairments): Problem solving, Reasoning, Sequencing OT - Cognition Comments: Pt able to state she is in hospital, unable to state which hospital. Improved cog, still delayed processing and sequencing       Following commands: Impaired Following commands impaired: Follows one step commands with increased time      Cueing   Cueing Techniques: Verbal cues, Gestural cues, Tactile cues        General Comments VSS on RA    Pertinent Vitals/ Pain       Pain Assessment Pain Assessment: Faces Faces Pain Scale: Hurts little more Pain Location: back and legs with movement Pain Descriptors / Indicators: Grimacing, Aching, Sore Pain Intervention(s): Limited activity within patient's tolerance   Frequency  Min 2X/week        Progress Toward Goals  OT Goals(current goals can now be found in the care plan section)  Progress towards OT goals: Progressing toward goals  Acute Rehab OT Goals Patient Stated Goal: To rest OT Goal Formulation: With patient/family Time For Goal Achievement:  08/17/24 Potential to Achieve Goals: Good ADL Goals Pt Will Perform Eating: with set-up;sitting Pt Will Perform Grooming: with set-up;sitting Pt Will Perform Upper Body Bathing: with set-up;sitting Pt Will Perform Upper Body Dressing: with set-up;sitting  Plan      Co-evaluation    PT/OT/SLP Co-Evaluation/Treatment: Yes Reason for Co-Treatment: Complexity of the patient's impairments (multi-system involvement);For patient/therapist safety PT goals addressed during session: Mobility/safety with mobility OT goals addressed during session: ADL's and self-care      AM-PAC OT 6 Clicks Daily Activity     Outcome Measure   Help from another person eating meals?: A Little Help from another person taking care of personal grooming?: A Little Help from another person toileting, which includes using toliet, bedpan, or urinal?: Total Help from another person bathing (including washing, rinsing, drying)?: A Lot Help from another person to put on and taking off regular upper body clothing?: A Little Help from another person to put on and taking off regular lower body clothing?: Total 6 Click Score: 13    End of Session Equipment Utilized During Treatment: Gait belt  OT Visit Diagnosis: Unsteadiness on feet (R26.81);Other abnormalities of gait and mobility (R26.89);Muscle weakness (generalized) (M62.81)   Activity Tolerance Patient limited by pain   Patient Left in bed;with call bell/phone within reach;with bed alarm set;with family/visitor present   Nurse Communication Mobility status        Time: 9046-8974 OT Time Calculation (min): 32 min  Charges: OT General Charges $OT Visit: 1 Visit OT Treatments $Self Care/Home Management : 8-22 mins  Adrianne BROCKS, OT  Acute Rehabilitation Services Office [phone] Secure chat preferred   Adrianne GORMAN Savers 08/05/2024, 11:47 AM

## 2024-08-05 NOTE — Progress Notes (Signed)
 PHARMACY NOTE:  ANTIMICROBIAL RENAL DOSAGE ADJUSTMENT  Current antimicrobial regimen includes a mismatch between antimicrobial dosage and estimated renal function.  As per policy approved by the Pharmacy & Therapeutics and Medical Executive Committees, the antimicrobial dosage will be adjusted accordingly.  Current antimicrobial dosage:  Zosyn  3.375gm IV q8h (4hr extended infusions)  Indication: Otitis externa  Renal Function:  Estimated Creatinine Clearance: 15.1 mL/min (A) (by C-G formula based on SCr of 3.5 mg/dL (H)). []      On intermittent HD, scheduled: []      On CRRT    Antimicrobial dosage has been changed to:  2.25g IV q8h  Additional comments:   Thank you for allowing pharmacy to be a part of this patient's care.  Rocky Slade, PharmD, BCPS 08/05/2024 2:54 PM

## 2024-08-05 NOTE — Plan of Care (Signed)

## 2024-08-05 NOTE — TOC Progression Note (Addendum)
 Transition of Care [facility]) - Progression Note    Patient Details  Name: Beth [name] MRN: [id number] Date of Birth: 1946/04/29  [facility] CM/SW Contact  [name], [place] Phone Number: 08/05/2024, 10:39 AM  Clinical Narrative:   Per [name] with [facility] admissions, [name] is rescinding bed offer for SNF at this time.   CSW left VM for [name].   11:13 AM [name] called CSW back. CSW informed [name] that [name] rescinded bed offer for SNF as they are unable to offer patient a LTC bed after STR. [name] expressed her frustration with the situation and stated she does not want her mother going to any other SNF. CSW inquired if [name] would like for patient to go to [name] as that was family's second choice. [name] stated no, they would rather take patient home and they do not want to choose another facility.   [name] inquired about patient only going to [name] for STR. CSW explained that facility rescinded bed offer for STR but CSW will reach back out to ask if they are willing to reassess. CSW inquired if after STR will family take patient home, [name] stated if they need to take patient home they will. CSW asked [name] to reassess patient referral for STR as family stated they will take her home after. [name] stated she will inquire with other staff member and re-review.   Plan: Awaiting final determination from [name] on if patient can go to facility for short term rehab.   12:36 PM Per [name], referral approved for short term rehab. CSW informed [name] that facility can only accept patient for short term rehab pending authorization. Insurance authorization still pending.   Plan: [name] pending insurance authorization approval.   12:40 PM CSW left voicemail for [name] with [name] to confirm facility was switched fr auth.   1:52 PM CSW called [name] with [name] and informed her that family wants [name] at this time. [name] stated she will update facility for pending auth.   CSW will  continue to follow.    Expected Discharge Plan: Skilled Nursing Facility Barriers to Discharge: Continued Medical Work up, SNF Pending bed offer, English as a second language teacher               Expected Discharge Plan and Services In-house Referral: Clinical Social Work     Living arrangements for the past 2 months: Single Family Home                                       Social Drivers of Health (SDOH) Interventions SDOH Screenings   Food Insecurity: No Food Insecurity (08/01/2024)  Housing: Low Risk  (08/01/2024)  Transportation Needs: No Transportation Needs (08/01/2024)  Recent Concern: Transportation Needs - Unmet Transportation Needs (07/20/2024)   Received from [facility]  Utilities: Not At Risk (08/01/2024)  Alcohol Screen: Low Risk  (12/30/2023)  Depression (PHQ2-9): Medium Risk (07/20/2024)  Financial Resource Strain: Low Risk  (07/20/2024)   Received from [facility]  Physical Activity: Inactive (07/20/2024)   Received from [facility]  Social Connections: Moderately Integrated (08/01/2024)  Recent Concern: Social Connections - Socially Isolated (07/20/2024)   Received from [facility]  Stress: No Stress Concern Present (07/20/2024)   Received from [facility]  Tobacco Use: Low Risk  (08/02/2024)  Health Literacy: Low Risk  (07/20/2024)   Received from [facility]  Readmission Risk Interventions     No data to display

## 2024-08-05 NOTE — Progress Notes (Signed)
 Progress Note Patient: Beth [name] FMW:[id number] DOB: [birth year] DOA: 08/01/2024     4 DOS: the patient was seen and examined on 08/05/2024   Brief hospital course: Beth [name] is a 78 y.o. female with medical history significant of hypertension, hyperlipidemia, diastolic CHF, CVA, diabetes mellitus type 2, and chronic kidney disease stage IIIb presents with elevated blood pressure, earache, throat ache, headache, nausea, and weakness for further evaluation.  Outside facility labs from 8/23 noted WBC 9.1, hemoglobin 13.5, sodium 133, potassium 3.3, chloride 95, BUN 65, creatinine 3.31, proBNP 1649,  high-sensitivity troponin 374-> 358-> 350,  and delta at  hsTrop 16->8.  CT scan of the head showed no acute intercranial abnormality with opacification of the right maxillary sinus that was new.  Chest x-ray noted mild cardiomegaly with left ventricular prominence with lungs otherwise noted to be clear.  Urinalysis was significant for protein and glucose, but no signs for infection.  CT scan of the abdomen pelvis was also done that did not show any signs of hydronephrosis or ureteral calculus. Workup notable for left ear otitis externa started on clindamycin and Rocephin  admitted to TRH service with cardiology evaluation for elevated troponin. --------------------------------------------------------------------------------------------------------------  Subjective:  The patient was seen and examined this morning.  Hemodynamically stable, drowsy, lethargic but arousable. Denies any chest pain or shortness of breath, complain of generalized weakness - Improved ear pain, hemodynamically stable  Son present at bedside  ------------------------------------------------------------------------------------------------------------------   Assessment and Plan:  Elevated troponin Denies any chest pain, hemodynamically stable  POA:  generalized malaise and substernal chest pain..  Found to have  high-sensitivity troponins at the outside facility374-> 358-> 350  and delta at  hsTrop 16->8. High-sensitivity troponin elevated> 2300 Echocardiogram 8/25 LVEF of 60 to 65%, grade 1 diastolic dysfunction, normal RV, no significant valvular disease. Cardiology advised no further cardiac intervention due to her age, kidney dysfunction, poor functional level of activity. Heparin  drip stopped. Medical therapy with Aspirin  + plavix  started.   Hypertensive urgency Blood pressure stabilized  -Continue hydralazine , Cardizem , Imdur  - Held beta-blocker due to bradycardia  Hold ARB/ hydrochlorothiazide  due to kidney dysfunction. Increased imdur  8/28   Acute metabolic encephalopathy -Mentation improving -Today lethargic but arousable, - No focal neurological findings - CT scan of the head unremarkable.  Continue Neurochecks.  Heart failure with preserved ejection fraction - Monitoring I's and O's, daily weight  Intake/Output Summary (Last 24 hours) at 08/05/2024 1340 Last data filed at 08/05/2024 0549 Gross per 24 hour  Intake 199.94 ml  Output 500 ml  Net -300.06 ml     Hold diuretic as she is not hypervolemic, and with her kidney dysfunction. Elevated creatinine  Echo: Left ventricular ejection fraction, by estimation, is 60 to 65%. The  left ventricle has normal function.    Acute kidney injury superimposed on chronic kidney disease IIIb - Worsening kidney function - Nephrologist consulted-appreciate close follow-up and recommendations -GFR 13, 14, 13, BUN 53, 54, 56 Lab Results  Component Value Date   CREATININE 3.50 (H) 08/05/2024   CREATININE 3.22 (H) 08/04/2024   CREATININE 3.45 (H) 08/03/2024   -Renal ultrasound    Otitis externa Much improved, afebrile, improved WBC At the outside facility patient had been given clindamycin and Rocephin . Ciprofloxacin  eardrops twice daily Zosyn  IV in the acute setting as unable to give Cipro  due to prolonged QT interval   Sore  throat Aspiration precautions with elevation head of the bed Nystatin  swish and swallow ordered. SLP advised regular diet.   Prolonged  QT interval QT prolonged at 508. Correct electrolyte abnormalities Avoid further QT prolonging medications.  Hypokalemia - monitoring  Oral replacement ordered.   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 PT/ OT on board, advised SNF placement.  Obesity Class II- Body mass index is 36.94 kg/m. Diet, exercise and weight reductions.     Out of bed to chair. Incentive spirometry. Nursing supportive care. Fall, aspiration precautions. Diet:  Diet Orders (From admission, onward)     Start     Ordered   08/01/24 0727  Diet heart healthy/carb modified Room service appropriate? Yes; Fluid consistency: Thin  Diet effective now       Question Answer Comment  Diet-HS Snack? Nothing   Room service appropriate? Yes   Fluid consistency: Thin      08/01/24 0728           DVT prophylaxis: heparin  injection 5,000 Units Start: 08/02/24 1400  Level of care: Telemetry Cardiac   Code Status: Full Code    Physical Exam: Vitals:   08/05/24 0750 08/05/24 0752 08/05/24 1045 08/05/24 1128  BP: (!) 172/93 (!) 177/94 (!) 147/87 132/73  Pulse: (!) 57 (!) 56 61 (!) 57  Resp: 20 16  14   Temp: 98.2 F (36.8 C) 98.8 F (37.1 C)  98.1 F (36.7 C)  TempSrc:  Axillary  Axillary  SpO2: 96% 97%  92%  Weight:      Height:         General:  Lethargic but arousable - AAO x 3,  cooperative, no distress;   HEENT:  Normocephalic, PERRL, otherwise with in Normal limits   Neuro:  CNII-XII intact. , normal motor and sensation, reflexes intact   Lungs:   Clear to auscultation BL,  Respirations unlabored,  No wheezes / crackles  Cardio:    S1/S2, RRR, No murmure, No Rubs or Gallops   Abdomen:  Soft, non-tender, bowel sounds active all four quadrants, no guarding or peritoneal signs.  Muscular  skeletal:  Limited exam -global generalized weaknesses - in bed, able to move all 4 extremities,   2+ pulses,  symmetric, No pitting edema  Skin:  Dry, warm to touch, negative for any Rashes,  Wounds: Please see nursing documentation          Data Reviewed:    Latest Ref Rng & Units 08/05/2024    2:17 AM 08/04/2024    2:29 AM 08/03/2024    2:41 AM  CBC  WBC 4.0 - 10.5 K/uL 9.8  10.6  9.3   Hemoglobin 12.0 - 15.0 g/dL 88.7  87.6  86.8   Hematocrit 36.0 - 46.0 % 35.7  38.9  41.1   Platelets 150 - 400 K/uL 259  291  303       Latest Ref Rng & Units 08/05/2024    2:17 AM 08/04/2024    2:29 AM 08/03/2024    2:41 AM  BMP  Glucose 70 - 99 mg/dL 833  833  840   BUN 8 - 23 mg/dL 56  54  53   Creatinine 0.44 - 1.00 mg/dL 6.49  6.77  6.54   Sodium 135 - 145 mmol/L 138  138  141   Potassium 3.5 - 5.1 mmol/L 4.2  3.4  3.2   Chloride 98 -  111 mmol/L 105  105  102   CO2 22 - 32 mmol/L 20  22  21    Calcium  8.9 - 10.3 mg/dL 8.4  8.5  8.7    No results found.   Family Communication: son at bedside   Disposition: Status is: Inpatient Remains inpatient appropriate because: , AKI, elevated trop, debility.  Planned Discharge Destination: SNF in next 24-48 hours  Discussed with TOC, family--requesting SNF at [facility] If not approved will take the patient home with home health      Time spent: 55 minutes  Author:  SIGNED: Adriana DELENA Grams, MD, FHM. FAAFP [facility],  Pager (please use [email or web address] to page/text)  Please use Epic Secure Chat for non-urgent communication (7AM-7PM) If 7PM-7AM, please contact night-coverage [email or web address],  08/05/2024, 1:36 PM

## 2024-08-06 ENCOUNTER — Inpatient Hospital Stay (HOSPITAL_COMMUNITY)

## 2024-08-06 DIAGNOSIS — R0989 Other specified symptoms and signs involving the circulatory and respiratory systems: Secondary | ICD-10-CM | POA: Diagnosis not present

## 2024-08-06 DIAGNOSIS — I700 Atherosclerosis of aorta: Secondary | ICD-10-CM | POA: Diagnosis not present

## 2024-08-06 DIAGNOSIS — I517 Cardiomegaly: Secondary | ICD-10-CM | POA: Diagnosis not present

## 2024-08-06 DIAGNOSIS — G9341 Metabolic encephalopathy: Secondary | ICD-10-CM | POA: Diagnosis not present

## 2024-08-06 DIAGNOSIS — R7989 Other specified abnormal findings of blood chemistry: Secondary | ICD-10-CM | POA: Diagnosis not present

## 2024-08-06 DIAGNOSIS — R9431 Abnormal electrocardiogram [ECG] [EKG]: Secondary | ICD-10-CM | POA: Diagnosis not present

## 2024-08-06 DIAGNOSIS — I160 Hypertensive urgency: Secondary | ICD-10-CM | POA: Diagnosis not present

## 2024-08-06 LAB — CBC
HCT: 34.7 % — ABNORMAL LOW (ref 36.0–46.0)
Hemoglobin: 10.8 g/dL — ABNORMAL LOW (ref 12.0–15.0)
MCH: 26.2 pg (ref 26.0–34.0)
MCHC: 31.1 g/dL (ref 30.0–36.0)
MCV: 84.2 fL (ref 80.0–100.0)
Platelets: 250 K/uL (ref 150–400)
RBC: 4.12 MIL/uL (ref 3.87–5.11)
RDW: 15.9 % — ABNORMAL HIGH (ref 11.5–15.5)
WBC: 10.1 K/uL (ref 4.0–10.5)
nRBC: 0 % (ref 0.0–0.2)

## 2024-08-06 LAB — GLUCOSE, CAPILLARY
Glucose-Capillary: 163 mg/dL — ABNORMAL HIGH (ref 70–99)
Glucose-Capillary: 187 mg/dL — ABNORMAL HIGH (ref 70–99)
Glucose-Capillary: 131 mg/dL — ABNORMAL HIGH (ref 70–99)
Glucose-Capillary: 202 mg/dL — ABNORMAL HIGH (ref 70–99)

## 2024-08-06 LAB — BASIC METABOLIC PANEL WITH GFR
Anion gap: 14 (ref 5–15)
BUN: 56 mg/dL — ABNORMAL HIGH (ref 8–23)
CO2: 18 mmol/L — ABNORMAL LOW (ref 22–32)
Calcium: 8.2 mg/dL — ABNORMAL LOW (ref 8.9–10.3)
Chloride: 104 mmol/L (ref 98–111)
Creatinine, Ser: 3.26 mg/dL — ABNORMAL HIGH (ref 0.44–1.00)
GFR, Estimated: 14 mL/min — ABNORMAL LOW (ref >60)
Glucose, Bld: 154 mg/dL — ABNORMAL HIGH (ref 70–99)
Potassium: 3.9 mmol/L (ref 3.5–5.1)
Sodium: 136 mmol/L (ref 135–145)

## 2024-08-06 LAB — CULTURE, BLOOD (ROUTINE X 2)
Culture: NO GROWTH
Culture: NO GROWTH
Special Requests: ADEQUATE

## 2024-08-06 MED ORDER — AMOXICILLIN-POT CLAVULANATE 500-125 MG PO TABS
1.0000 | ORAL_TABLET | Freq: Two times a day (BID) | ORAL | Status: DC
  Administered 2024-08-06 – 2024-08-07 (×3): 1 via ORAL
  Filled 2024-08-06 (×4): qty 1

## 2024-08-06 MED ORDER — INSULIN ASPART 100 UNIT/ML IJ SOLN
0.0000 [IU] | Freq: Three times a day (TID) | INTRAMUSCULAR | Status: DC
  Administered 2024-08-06: 2 [IU] via SUBCUTANEOUS
  Administered 2024-08-06: 3 [IU] via SUBCUTANEOUS
  Administered 2024-08-07: 5 [IU] via SUBCUTANEOUS

## 2024-08-06 MED ORDER — OXYCODONE HCL 5 MG PO TABS
5.0000 mg | ORAL_TABLET | ORAL | Status: DC | PRN
  Administered 2024-08-06: 5 mg via ORAL
  Filled 2024-08-06: qty 1

## 2024-08-06 MED ORDER — INSULIN ASPART 100 UNIT/ML IJ SOLN
0.0000 [IU] | Freq: Every day | INTRAMUSCULAR | Status: DC
  Administered 2024-08-06: 2 [IU] via SUBCUTANEOUS

## 2024-08-06 MED ORDER — HYDROMORPHONE HCL 1 MG/ML IJ SOLN: 0.5000 mg | INTRAMUSCULAR | Status: DC | PRN

## 2024-08-06 MED ORDER — TECHNETIUM TO 99M ALBUMIN AGGREGATED
4.3600 | Freq: Once | INTRAVENOUS | Status: AC | PRN
  Administered 2024-08-06: 4.36 via INTRAVENOUS

## 2024-08-06 NOTE — Progress Notes (Signed)
 Nephrology Follow-Up Consult note   Assessment/Recommendations: Beth [name] is a/an 78 y.o. female with a past medical history significant for HTN diastolic HF, CVA, DM2, CKD 3 who presents to [facility] as transfer with malaise.    Non-Oliguric AKI on CKD (improving): Likely secondary to hypotension/relative hypotension  Renal function slightly worsened today, however relatively unchanged. Would continue with supportive care Consider MIVF, if decreased p.o. intake.  -Chart reviewed: (medications acceptable, does not appear to have been exposed to nephrotoxins with imaging. Had episodes of significant hypotension).   -Continue to monitor daily Cr, Dose meds for GFR -Monitor Daily I/Os, Daily weight  -Maintain MAP>65 for optimal renal perfusion.  -Avoid nephrotoxic medications including NSAIDs -Use synthetic opioids (Fentanyl /Dilaudid ) if needed -Currently no indication for HD  Volume Status: Appears euvolemic on exam. Based on our examination and review of available imaging, our recommendation is as above.  Hypokalemia: Resolved.  Defer to primary.  Metabolic acidosis: Mild.  Monitor.   Recommendations conveyed to primary service.    [name] Shlomo Seres [facility] 08/06/2024 11:51 AM  ___________________________________________________________  CC: Malaise  Interval History/Subjective: No major events reported overnight.  Seen in bed.  No family at bedside.  States she is doing well.  Medications:  Current Facility-Administered Medications  Medication Dose Route Frequency Provider Last Rate Last Admin   acetaminophen  (TYLENOL ) tablet 650 mg  650 mg Oral Q6H PRN Smith, Rondell A, MD   650 mg at 08/06/24 9265   Or   acetaminophen  (TYLENOL ) suppository 650 mg  650 mg Rectal Q6H PRN Smith, Rondell A, MD       albuterol  (PROVENTIL ) (2.5 MG/3ML) 0.083% nebulizer solution 2.5 mg  2.5 mg Nebulization Q6H PRN Smith, Rondell A, MD       amoxicillin -clavulanate (AUGMENTIN )  500-125 MG per tablet 1 tablet  1 tablet Oral BID Sreeram, Narendranath, MD       aspirin  EC tablet 81 mg  81 mg Oral Daily Zhao, Xika, NP   81 mg at 08/06/24 9062   atorvastatin  (LIPITOR ) tablet 80 mg  80 mg Oral QPM Zhao, Xika, NP   80 mg at 08/05/24 1651   ciprofloxacin -dexamethasone  (CIPRODEX ) 0.3-0.1 % OTIC (EAR) suspension 4 drop  4 drop Left EAR BID Claudene Reeves A, MD   4 drop at 08/06/24 0816   dextrose  50 % solution 50 mL  1 ampule Intravenous PRN Claudene Reeves A, MD       diltiazem  (CARDIZEM  CD) 24 hr capsule 240 mg  240 mg Oral QPM Smith, Rondell A, MD   240 mg at 08/05/24 1652   heparin  injection 5,000 Units  5,000 Units Subcutaneous Q8H Henry Shaver B, NP   5,000 Units at 08/06/24 9444   hydrALAZINE  (APRESOLINE ) tablet 100 mg  100 mg Oral TID Smith, Rondell A, MD   100 mg at 08/06/24 9063   HYDROmorphone  (DILAUDID ) injection 0.5 mg  0.5 mg Intravenous Q4H PRN Darci Pore, MD       insulin  aspart (novoLOG ) injection 0-15 Units  0-15 Units Subcutaneous TID WC Sreeram, Narendranath, MD       insulin  aspart (novoLOG ) injection 0-5 Units  0-5 Units Subcutaneous QHS Sreeram, Narendranath, MD       isosorbide  mononitrate (IMDUR ) 24 hr tablet 60 mg  60 mg Oral Daily Lenon Pons L, MD   60 mg at 08/06/24 9062   nystatin  (MYCOSTATIN ) 100000 UNIT/ML suspension 500,000 Units  5 mL Oral QID Smith, Rondell A, MD   500,000 Units at 08/06/24 [phone]  Oral care mouth rinse  15 mL Mouth Rinse PRN Darci Pore, MD       oxyCODONE  (Oxy IR/ROXICODONE ) immediate release tablet 5 mg  5 mg Oral Q4H PRN Darci Pore, MD       sodium chloride  flush (NS) 0.9 % injection 3 mL  3 mL Intravenous Q12H Smith, Rondell A, MD   3 mL at 08/06/24 0937   trimethobenzamide  (TIGAN ) injection 200 mg  200 mg Intramuscular Q6H PRN Claudene Reeves A, MD   200 mg at 08/05/24 2324      Review of Systems: 10 systems reviewed and negative except per interval history/subjective  Physical  Exam: Vitals:   08/06/24 0752 08/06/24 1101  BP: (!) 167/87 137/70  Pulse: 60 65  Resp: 18 16  Temp: 97.7 F (36.5 C) 97.8 F (36.6 C)  SpO2: 98% 97%   No intake/output data recorded.  Intake/Output Summary (Last 24 hours) at 08/06/2024 1151 Last data filed at 08/05/2024 2318 Gross per 24 hour  Intake 354 ml  Output 300 ml  Net 54 ml   General: Elderly-appearing, no acute distress CV: RRR, no peripheral edema Lungs: BLBS, normal work of breathing Abd: soft, non-tender, non-distended Psych: alert, engaged, appropriate mood and affect Musculoskeletal: no obvious deformities Neuro: normal speech, no gross focal deficits    Test Results I personally reviewed new and old clinical labs and radiology tests Lab Results  Component Value Date   NA 136 08/06/2024   K 3.9 08/06/2024   CL 104 08/06/2024   CO2 18 (L) 08/06/2024   BUN 56 (H) 08/06/2024   CREATININE 3.26 (H) 08/06/2024   CALCIUM  8.2 (L) 08/06/2024   ALBUMIN  2.9 (L) 08/01/2024    CBC Recent Labs  Lab 08/01/24 0924 08/02/24 0057 08/04/24 0229 08/05/24 0217 08/06/24 0244  WBC 7.7   < > 10.6* 9.8 10.1  NEUTROABS 5.8  --   --   --   --   HGB 12.8   < > 12.3 11.2* 10.8*  HCT 40.3   < > 38.9 35.7* 34.7*  MCV 82.8   < > 82.8 83.4 84.2  PLT 269   < > 291 259 250   < > = values in this interval not displayed.

## 2024-08-06 NOTE — Progress Notes (Signed)
 Progress Note   Patient: Beth [name] FMW:[id number] DOB: 03-24-1946 DOA: 08/01/2024     5 DOS: the patient was seen and examined on 08/06/2024   Brief hospital course: Beth [name] is a 78 y.o. female with medical history significant of hypertension, hyperlipidemia, diastolic CHF, CVA, diabetes mellitus type 2, and chronic kidney disease stage IIIb presents with elevated blood pressure, earache, throat ache, headache, nausea, and weakness for further evaluation.  Outside facility labs from 8/23 noted WBC 9.1, hemoglobin 13.5, sodium 133, potassium 3.3, chloride 95, BUN 65, creatinine 3.31, proBNP 1649,  high-sensitivity troponin 374-> 358-> 350,  and delta at  hsTrop 16->8.  CT scan of the head showed no acute intercranial abnormality with opacification of the right maxillary sinus that was new.  Chest x-ray noted mild cardiomegaly with left ventricular prominence with lungs otherwise noted to be clear.  Urinalysis was significant for protein and glucose, but no signs for infection.  CT scan of the abdomen pelvis was also done that did not show any signs of hydronephrosis or ureteral calculus. Workup notable for left ear otitis externa started on clindamycin and Rocephin  admitted to TRH service with cardiology evaluation for elevated troponin.  Assessment and Plan: Elevated troponin Presented with complaints of generalized malaise and substernal chest pain.. Found to have high-sensitivity troponins at the outside facility374-> 358-> 350  and delta at  hsTrop 16->8. High-sensitivity troponin elevated> 2300 Echocardiogram 8/25 LVEF of 60 to 65%, grade 1 diastolic dysfunction, normal RV, no significant valvular disease. Cardiology advised no further cardiac intervention due to her age, kidney dysfunction, poor functional level of activity. Heparin  drip stopped. Continue medical therapy with Aspirin  + statin.   Hypertensive urgency Held beta-blocker due to bradycardia Continue hydralazine  and  Cardizem . imdur  per cardiology. Hold ARB/ hydrochlorothiazide  due to kidney dysfunction. Hydralazine  IV as needed for elevated blood pressure   Acute metabolic encephalopathy Patient presented more lethargic and altered.  No focal deficits appreciated on physical exam. CT scan of the head unremarkable. Her mentation back to baseline. She is weak, need SNF placement.  Heart failure with preserved ejection fraction Hold diuretic as she is not hypervolemic, and with her kidney dysfunction. Strict I&O's and daily weights Follow-up echocardiogram   Acute kidney injury superimposed on chronic kidney disease IIIb Patient presented to the outside facility with creatinine elevated up to 3.31 at the outside facility.  Repeat check here noted to be 2.66 with BUN 53.  Baseline creatinine previously noted to be around 1.5- 2. Very gentle IV fluids for now. Monitor daily renal function. Nephrology evaluation called, follow recommendations.  Elevated d-dimer- Right calf pain- Check right leg DVT Study. V/Q scan rule out PE ordered.   Otitis externa At the outside facility patient had been given clindamycin and Rocephin . Ciprofloxacin  eardrops twice daily Zosyn  IV changed to Augmentin  to finish 7 days. Aspiration precautions with elevation head of the bed. Nystatin  swish and swallow ordered. SLP advised regular diet.   Prolonged QT interval QT prolonged at 508. Repeat EKG for repeat QT check. Avoid further QT prolonging medications.  Hypokalemia- Improved with replacements.   Controlled diabetes mellitus type 2, without long-term use of insulin  Hemoglobin A1c 6.5 Accuchecks, sliding scale per protocol. Hypoglycemia protocol.   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 PT/ OT advised SNF placement. Per TOC she has bed available.  Obesity Class  II- Diet, exercise and weight  reduction.     Out of bed to chair. Incentive spirometry. Nursing supportive care. Fall, aspiration precautions. Diet:  Diet Orders (From admission, onward)     Start     Ordered   08/01/24 0727  Diet heart healthy/carb modified Room service appropriate? Yes; Fluid consistency: Thin  Diet effective now       Question Answer Comment  Diet-HS Snack? Nothing   Room service appropriate? Yes   Fluid consistency: Thin      08/01/24 0728           DVT prophylaxis: heparin  injection 5,000 Units Start: 08/02/24 1400  Level of care: Telemetry Cardiac   Code Status: Full Code  Subjective: Patient is seen and examined today morning. She does have ear pain. Eating well. Did get out of bed with lot of help. Admits right calf pain   Physical Exam: Vitals:   08/06/24 0443 08/06/24 0752 08/06/24 1101 08/06/24 1200  BP: (!) 155/84 (!) 167/87 137/70 (!) 140/75  Pulse: (!) 59 60 65 65  Resp: 19 18 16  (!) 25  Temp: 98.1 F (36.7 C) 97.7 F (36.5 C) 97.8 F (36.6 C)   TempSrc: Oral Oral Oral   SpO2: 98% 98% 97% 96%  Weight: 101 kg     Height:        General - Elderly African American female, no acute distress. HEENT - PERRLA, EOMI, atraumatic head, non tender sinuses. Lung - Clear, basal rales, rhonchi, no wheezes. Heart - S1, S2 heard, no murmurs, rubs, trace pedal edema. Abdomen - Soft, non tender, bowel sounds good Neuro - alert, awake and oriented, non focal exam. Skin - Warm and dry. Left ear dressing noted.  Data Reviewed:      Latest Ref Rng & Units 08/06/2024    2:44 AM 08/05/2024    2:17 AM 08/04/2024    2:29 AM  CBC  WBC 4.0 - 10.5 K/uL 10.1  9.8  10.6   Hemoglobin 12.0 - 15.0 g/dL 89.1  88.7  87.6   Hematocrit 36.0 - 46.0 % 34.7  35.7  38.9   Platelets 150 - 400 K/uL 250  259  291       Latest Ref Rng & Units 08/06/2024    2:44 AM 08/05/2024    2:17 AM 08/04/2024    2:29 AM  BMP  Glucose 70 - 99 mg/dL 845  833  833   BUN 8 -  23 mg/dL 56  56  54   Creatinine 0.44 - 1.00 mg/dL 6.73  6.49  6.77   Sodium 135 - 145 mmol/L 136  138  138   Potassium 3.5 - 5.1 mmol/L 3.9  4.2  3.4   Chloride 98 - 111 mmol/L 104  105  105   CO2 22 - 32 mmol/L 18  20  22    Calcium  8.9 - 10.3 mg/dL 8.2  8.4  8.5    No results found.   Family Communication: Discussed with patient's daughters at bedside, understand and agree. All questions answered.  Disposition: Status is: Inpatient Remains inpatient appropriate because: , AKI, elevated trop, debility.  Planned Discharge Destination: Home with Home Health     Time spent: 45 minutes  Author: Concepcion Riser, MD 08/06/2024 1:58 PM Secure chat 7am to 7pm For on call review [email or web address].

## 2024-08-06 NOTE — TOC Progression Note (Addendum)
 Transition of Care [facility]) - Initial/Assessment Note    Patient Details  Name: Beth [name] MRN: [id number] Date of Birth: 08-17-1946  Transition of Care [facility]) CM/SW Contact:    Britt JULIANNA Bennetts, LCSW Phone Number: 08/06/2024, 9:08 AM  Clinical Narrative:                 CSW notified that insurance authorization has been approved for SNF gelene number- [id number], approved for 5 business days) and ambulance transfer gelene [phone]).    CSW contacted [facility] and inquired as to whether they can accept the pt today and is awaiting a response.    9:30-  [name] confirmed that they can accept the pt today.  MD notified.  10:31- Facility informed CSW that a resident of the facility has tested positive for COVID.  Facility contacted pt.'s daughter and reports that the family is still in agreement with pt discharging to facility when medically ready.  14:18- Per MD, possible d/c tomorrow.  Facility notified.   TOC will continue to follow.   Expected Discharge Plan: Skilled Nursing Facility Barriers to Discharge: Continued Medical Work up, SNF Pending bed offer, Insurance Authorization   Patient Goals and CMS Choice Patient states their goals for this hospitalization and ongoing recovery are:: To go to rehab          Expected Discharge Plan and Services In-house Referral: Clinical Social Work     Living arrangements for the past 2 months: Single Family Home                                      Prior Living Arrangements/Services Living arrangements for the past 2 months: Single Family Home Lives with:: Self Patient language and need for interpreter reviewed:: No Do you feel safe going back to the place where you live?: Yes      Need for Family Participation in Patient Care: Yes (Comment) Care giver support system in place?: Yes (comment)   Criminal Activity/Legal Involvement Pertinent to Current Situation/Hospitalization: No - Comment as needed  Activities of  Daily Living   ADL Screening (condition at time of admission) Independently performs ADLs?: No Does the patient have a NEW difficulty with bathing/dressing/toileting/self-feeding that is expected to last >3 days?: No Does the patient have a NEW difficulty with getting in/out of bed, walking, or climbing stairs that is expected to last >3 days?: No Does the patient have a NEW difficulty with communication that is expected to last >3 days?: No Is the patient deaf or have difficulty hearing?: No Does the patient have difficulty seeing, even when wearing glasses/contacts?: No Does the patient have difficulty concentrating, remembering, or making decisions?: No  Permission Sought/Granted Permission sought to share information with : Family Supports Permission granted to share information with : No (Paitent oriented x1, family contact in chart)  Share Information with NAME: Anderson,Joanna  Permission granted to share info w AGENCY: SNF  Permission granted to share info w Relationship: daughter  Permission granted to share info w Contact Information: [phone]  Emotional Assessment Appearance:: Appears stated age Attitude/Demeanor/Rapport: Lethargic Affect (typically observed): Stable Orientation: : Oriented to Self Alcohol / Substance Use: Not Applicable Psych Involvement: No (comment)  Admission diagnosis:  Elevated troponin [R79.89] Acute kidney injury superimposed on chronic kidney disease (HCC) [N17.9, N18.9] Patient Active Problem List   Diagnosis Date Noted   Obesity (BMI 30-39.9) 08/03/2024   Acute kidney  injury superimposed on chronic kidney disease (HCC) 08/01/2024   Prolonged QT interval 08/01/2024   Elevated troponin 08/01/2024   Hypertensive urgency 08/01/2024   Acute metabolic encephalopathy 08/01/2024   Otitis externa 08/01/2024   Heart failure with preserved ejection fraction (HCC) 08/01/2024   Sore throat 08/01/2024   Debility 08/01/2024   Stage 4 chronic kidney  disease (HCC) 07/25/2024   Hospital discharge follow-up 07/25/2024   Stage 2 chronic kidney disease 07/25/2024   Uncontrolled hypertension 07/25/2024   Neck pain, musculoskeletal 07/25/2024   Acute suppurative otitis media of left ear without spontaneous rupture of tympanic membrane 07/25/2024   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 12/31/2023   CHF (congestive heart failure) (HCC) 08/20/2023   Murmur 09/24/2021   Hamstring tendinitis of right thigh 08/22/2019   Osteoarthritis of both sacroiliac joints (HCC) 08/22/2019   Diabetes type 2, controlled (HCC) 03/07/2016   Gout    Essential hypertension    Enuresis 11/19/2015   HLD (hyperlipidemia) 01/04/2014   Hypokalemia 01/04/2014   Anemia, iron  deficiency 12/30/2011   Constipation 04/04/2009   TUBULOVILLOUS ADENOMA, COLON 04/03/2009   PCP:  Zollie Lowers, MD Pharmacy:   [facility] [place], [place] - [address] [place] [address] Phone: [phone] Fax: [phone]  [facility] [address], [place] - [address] [place] [address] Phone: [phone] Fax: [phone]  [facility] - [place], [place] - [address] [place] [address] Phone: [phone] Fax: [phone]     Social Drivers of Health (SDOH) Social History: SDOH Screenings   Food Insecurity: No Food Insecurity (08/01/2024)  Housing: Low Risk  (08/01/2024)  Transportation Needs: No Transportation Needs (08/01/2024)  Recent Concern: Transportation Needs - Unmet Transportation Needs (07/20/2024)   Received from [facility]  Utilities: Not At Risk (08/01/2024)  Alcohol Screen: Low Risk  (12/30/2023)  Depression (PHQ2-9): Medium Risk (07/20/2024)  Financial Resource Strain: Low Risk  (07/20/2024)   Received from [facility]  Physical Activity: Inactive (07/20/2024)   Received from [facility]  Social Connections:  Moderately Integrated (08/01/2024)  Recent Concern: Social Connections - Socially Isolated (07/20/2024)   Received from [facility]  Stress: No Stress Concern Present (07/20/2024)   Received from [facility]  Tobacco Use: Low Risk  (08/02/2024)  Health Literacy: Low Risk  (07/20/2024)   Received from [facility]   SDOH Interventions:     Readmission Risk Interventions     No data to display

## 2024-08-06 NOTE — Plan of Care (Signed)

## 2024-08-07 DIAGNOSIS — R1311 Dysphagia, oral phase: Secondary | ICD-10-CM | POA: Diagnosis not present

## 2024-08-07 DIAGNOSIS — Z888 Allergy status to other drugs, medicaments and biological substances status: Secondary | ICD-10-CM | POA: Diagnosis not present

## 2024-08-07 DIAGNOSIS — I129 Hypertensive chronic kidney disease with stage 1 through stage 4 chronic kidney disease, or unspecified chronic kidney disease: Secondary | ICD-10-CM | POA: Diagnosis not present

## 2024-08-07 DIAGNOSIS — R531 Weakness: Secondary | ICD-10-CM | POA: Diagnosis not present

## 2024-08-07 DIAGNOSIS — Z7401 Bed confinement status: Secondary | ICD-10-CM | POA: Diagnosis not present

## 2024-08-07 DIAGNOSIS — Z886 Allergy status to analgesic agent status: Secondary | ICD-10-CM | POA: Diagnosis not present

## 2024-08-07 DIAGNOSIS — Z89529 Acquired absence of unspecified knee: Secondary | ICD-10-CM | POA: Diagnosis not present

## 2024-08-07 DIAGNOSIS — M6281 Muscle weakness (generalized): Secondary | ICD-10-CM | POA: Diagnosis not present

## 2024-08-07 DIAGNOSIS — Z7189 Other specified counseling: Secondary | ICD-10-CM | POA: Diagnosis not present

## 2024-08-07 DIAGNOSIS — I110 Hypertensive heart disease with heart failure: Secondary | ICD-10-CM | POA: Diagnosis not present

## 2024-08-07 DIAGNOSIS — H608X2 Other otitis externa, left ear: Secondary | ICD-10-CM | POA: Diagnosis not present

## 2024-08-07 DIAGNOSIS — R4781 Slurred speech: Secondary | ICD-10-CM | POA: Diagnosis not present

## 2024-08-07 DIAGNOSIS — I10 Essential (primary) hypertension: Secondary | ICD-10-CM | POA: Diagnosis not present

## 2024-08-07 DIAGNOSIS — Z7901 Long term (current) use of anticoagulants: Secondary | ICD-10-CM | POA: Diagnosis not present

## 2024-08-07 DIAGNOSIS — Z96651 Presence of right artificial knee joint: Secondary | ICD-10-CM | POA: Diagnosis present

## 2024-08-07 DIAGNOSIS — G9341 Metabolic encephalopathy: Secondary | ICD-10-CM | POA: Diagnosis not present

## 2024-08-07 DIAGNOSIS — I160 Hypertensive urgency: Secondary | ICD-10-CM | POA: Diagnosis present

## 2024-08-07 DIAGNOSIS — H60502 Unspecified acute noninfective otitis externa, left ear: Secondary | ICD-10-CM | POA: Diagnosis present

## 2024-08-07 DIAGNOSIS — H6092 Unspecified otitis externa, left ear: Secondary | ICD-10-CM | POA: Diagnosis not present

## 2024-08-07 DIAGNOSIS — Z7983 Long term (current) use of bisphosphonates: Secondary | ICD-10-CM | POA: Diagnosis not present

## 2024-08-07 DIAGNOSIS — I6782 Cerebral ischemia: Secondary | ICD-10-CM | POA: Diagnosis not present

## 2024-08-07 DIAGNOSIS — Z8249 Family history of ischemic heart disease and other diseases of the circulatory system: Secondary | ICD-10-CM | POA: Diagnosis not present

## 2024-08-07 DIAGNOSIS — I509 Heart failure, unspecified: Secondary | ICD-10-CM | POA: Diagnosis not present

## 2024-08-07 DIAGNOSIS — R41841 Cognitive communication deficit: Secondary | ICD-10-CM | POA: Diagnosis not present

## 2024-08-07 DIAGNOSIS — A419 Sepsis, unspecified organism: Secondary | ICD-10-CM | POA: Diagnosis not present

## 2024-08-07 DIAGNOSIS — R2681 Unsteadiness on feet: Secondary | ICD-10-CM | POA: Diagnosis not present

## 2024-08-07 DIAGNOSIS — N184 Chronic kidney disease, stage 4 (severe): Secondary | ICD-10-CM | POA: Diagnosis present

## 2024-08-07 DIAGNOSIS — G510 Bell's palsy: Secondary | ICD-10-CM | POA: Diagnosis present

## 2024-08-07 DIAGNOSIS — Z881 Allergy status to other antibiotic agents status: Secondary | ICD-10-CM | POA: Diagnosis not present

## 2024-08-07 DIAGNOSIS — E669 Obesity, unspecified: Secondary | ICD-10-CM | POA: Diagnosis not present

## 2024-08-07 DIAGNOSIS — H538 Other visual disturbances: Secondary | ICD-10-CM | POA: Diagnosis present

## 2024-08-07 DIAGNOSIS — H6690 Otitis media, unspecified, unspecified ear: Secondary | ICD-10-CM | POA: Diagnosis not present

## 2024-08-07 DIAGNOSIS — R29810 Facial weakness: Secondary | ICD-10-CM | POA: Diagnosis not present

## 2024-08-07 DIAGNOSIS — Z91013 Allergy to seafood: Secondary | ICD-10-CM | POA: Diagnosis not present

## 2024-08-07 DIAGNOSIS — R7889 Finding of other specified substances, not normally found in blood: Secondary | ICD-10-CM | POA: Diagnosis not present

## 2024-08-07 DIAGNOSIS — R7989 Other specified abnormal findings of blood chemistry: Secondary | ICD-10-CM | POA: Diagnosis not present

## 2024-08-07 DIAGNOSIS — R059 Cough, unspecified: Secondary | ICD-10-CM | POA: Diagnosis not present

## 2024-08-07 DIAGNOSIS — Z833 Family history of diabetes mellitus: Secondary | ICD-10-CM | POA: Diagnosis not present

## 2024-08-07 DIAGNOSIS — M25861 Other specified joint disorders, right knee: Secondary | ICD-10-CM | POA: Diagnosis not present

## 2024-08-07 DIAGNOSIS — M109 Gout, unspecified: Secondary | ICD-10-CM | POA: Diagnosis present

## 2024-08-07 DIAGNOSIS — E785 Hyperlipidemia, unspecified: Secondary | ICD-10-CM | POA: Diagnosis present

## 2024-08-07 DIAGNOSIS — J320 Chronic maxillary sinusitis: Secondary | ICD-10-CM | POA: Diagnosis not present

## 2024-08-07 DIAGNOSIS — R5381 Other malaise: Secondary | ICD-10-CM | POA: Diagnosis present

## 2024-08-07 DIAGNOSIS — B028 Zoster with other complications: Secondary | ICD-10-CM | POA: Diagnosis present

## 2024-08-07 DIAGNOSIS — R509 Fever, unspecified: Secondary | ICD-10-CM | POA: Diagnosis not present

## 2024-08-07 DIAGNOSIS — Z7984 Long term (current) use of oral hypoglycemic drugs: Secondary | ICD-10-CM | POA: Diagnosis not present

## 2024-08-07 DIAGNOSIS — I5032 Chronic diastolic (congestive) heart failure: Secondary | ICD-10-CM | POA: Diagnosis present

## 2024-08-07 DIAGNOSIS — H6090 Unspecified otitis externa, unspecified ear: Secondary | ICD-10-CM | POA: Diagnosis not present

## 2024-08-07 DIAGNOSIS — H672 Otitis media in diseases classified elsewhere, left ear: Secondary | ICD-10-CM | POA: Diagnosis present

## 2024-08-07 DIAGNOSIS — I5033 Acute on chronic diastolic (congestive) heart failure: Secondary | ICD-10-CM | POA: Diagnosis not present

## 2024-08-07 DIAGNOSIS — H6692 Otitis media, unspecified, left ear: Secondary | ICD-10-CM | POA: Diagnosis not present

## 2024-08-07 DIAGNOSIS — Z86711 Personal history of pulmonary embolism: Secondary | ICD-10-CM | POA: Diagnosis not present

## 2024-08-07 DIAGNOSIS — Z8673 Personal history of transient ischemic attack (TIA), and cerebral infarction without residual deficits: Secondary | ICD-10-CM | POA: Diagnosis not present

## 2024-08-07 DIAGNOSIS — I5030 Unspecified diastolic (congestive) heart failure: Secondary | ICD-10-CM | POA: Diagnosis not present

## 2024-08-07 DIAGNOSIS — E1122 Type 2 diabetes mellitus with diabetic chronic kidney disease: Secondary | ICD-10-CM | POA: Diagnosis present

## 2024-08-07 DIAGNOSIS — Z1152 Encounter for screening for COVID-19: Secondary | ICD-10-CM | POA: Diagnosis not present

## 2024-08-07 DIAGNOSIS — I130 Hypertensive heart and chronic kidney disease with heart failure and stage 1 through stage 4 chronic kidney disease, or unspecified chronic kidney disease: Secondary | ICD-10-CM | POA: Diagnosis present

## 2024-08-07 DIAGNOSIS — R2689 Other abnormalities of gait and mobility: Secondary | ICD-10-CM | POA: Diagnosis not present

## 2024-08-07 LAB — CBC
HCT: 35.8 % — ABNORMAL LOW (ref 36.0–46.0)
Hemoglobin: 11.1 g/dL — ABNORMAL LOW (ref 12.0–15.0)
MCH: 26.4 pg (ref 26.0–34.0)
MCHC: 31 g/dL (ref 30.0–36.0)
MCV: 85.2 fL (ref 80.0–100.0)
Platelets: 275 K/uL (ref 150–400)
RBC: 4.2 MIL/uL (ref 3.87–5.11)
RDW: 15.8 % — ABNORMAL HIGH (ref 11.5–15.5)
WBC: 8.7 K/uL (ref 4.0–10.5)
nRBC: 0 % (ref 0.0–0.2)

## 2024-08-07 LAB — GLUCOSE, CAPILLARY
Glucose-Capillary: 109 mg/dL — ABNORMAL HIGH (ref 70–99)
Glucose-Capillary: 135 mg/dL — ABNORMAL HIGH (ref 70–99)
Glucose-Capillary: 203 mg/dL — ABNORMAL HIGH (ref 70–99)

## 2024-08-07 LAB — BASIC METABOLIC PANEL WITH GFR
Anion gap: 10 (ref 5–15)
BUN: 51 mg/dL — ABNORMAL HIGH (ref 8–23)
CO2: 20 mmol/L — ABNORMAL LOW (ref 22–32)
Calcium: 8.4 mg/dL — ABNORMAL LOW (ref 8.9–10.3)
Chloride: 106 mmol/L (ref 98–111)
Creatinine, Ser: 2.99 mg/dL — ABNORMAL HIGH (ref 0.44–1.00)
GFR, Estimated: 15 mL/min — ABNORMAL LOW (ref >60)
Glucose, Bld: 140 mg/dL — ABNORMAL HIGH (ref 70–99)
Potassium: 3.8 mmol/L (ref 3.5–5.1)
Sodium: 136 mmol/L (ref 135–145)

## 2024-08-07 MED ORDER — ISOSORBIDE MONONITRATE ER 60 MG PO TB24: 60.0000 mg | ORAL_TABLET | Freq: Every day | ORAL | 1 refills | Status: DC

## 2024-08-07 MED ORDER — APIXABAN 5 MG PO TABS
10.0000 mg | ORAL_TABLET | Freq: Two times a day (BID) | ORAL | Status: DC
  Administered 2024-08-07: 10 mg via ORAL
  Filled 2024-08-07: qty 2

## 2024-08-07 MED ORDER — APIXABAN 5 MG PO TABS: ORAL_TABLET | ORAL | 2 refills | Status: DC

## 2024-08-07 MED ORDER — APIXABAN 5 MG PO TABS: 5.0000 mg | ORAL_TABLET | Freq: Two times a day (BID) | ORAL | Status: DC

## 2024-08-07 MED ORDER — AMOXICILLIN-POT CLAVULANATE 500-125 MG PO TABS: 1.0000 | ORAL_TABLET | Freq: Two times a day (BID) | ORAL | 0 refills | Status: AC

## 2024-08-07 MED ORDER — CIPROFLOXACIN-DEXAMETHASONE 0.3-0.1 % OT SUSP
4.0000 [drp] | Freq: Two times a day (BID) | OTIC | 0 refills | Status: DC
Start: 2024-08-07 — End: 2024-09-03

## 2024-08-07 MED ORDER — ATORVASTATIN CALCIUM 20 MG PO TABS: 40.0000 mg | ORAL_TABLET | Freq: Every evening | ORAL | 1 refills | Status: DC

## 2024-08-07 MED ORDER — ASPIRIN 81 MG PO TBEC: 81.0000 mg | DELAYED_RELEASE_TABLET | Freq: Every day | ORAL | 1 refills | Status: DC

## 2024-08-07 NOTE — Discharge Summary (Signed)
 Physician Discharge Summary   Patient: Beth [name] MRN: [id number] DOB: 1946/01/05  Admit date:     08/01/2024  Discharge date: 08/07/24  Discharge Physician: Concepcion Riser   PCP: Zollie Lowers, MD   Recommendations at discharge:   PCP follow up in 1 week. Repeat labs in 1 week to resume her lasix , ARB. Lower extremity doppler study as outpatient. ENT follow up for otitis externa. Nephrology follow up as scheduled.  Discharge Diagnoses: Principal Problem:   Elevated troponin Active Problems:   Hypertensive urgency   Acute metabolic encephalopathy   Heart failure with preserved ejection fraction (HCC)   Acute kidney injury superimposed on chronic kidney disease (HCC)   Otitis externa   Sore throat   Prolonged QT interval   Diabetes type 2, controlled (HCC)   Debility   Hypokalemia   Obesity (BMI 30-39.9)  Resolved Problems:   * No resolved hospital problems. *  Hospital Course: Beth [name] is a 78 y.o. female with medical history significant of hypertension, hyperlipidemia, diastolic CHF, CVA, diabetes mellitus type 2, and chronic kidney disease stage IIIb presents with elevated blood pressure, earache, throat ache, headache, nausea, and weakness for further evaluation.  Outside facility labs from 8/23 noted WBC 9.1, hemoglobin 13.5, sodium 133, potassium 3.3, chloride 95, BUN 65, creatinine 3.31, proBNP 1649,  high-sensitivity troponin 374-> 358-> 350,  and delta at  hsTrop 16->8.  CT scan of the head showed no acute intercranial abnormality with opacification of the right maxillary sinus that was new.  Chest x-ray noted mild cardiomegaly with left ventricular prominence with lungs otherwise noted to be clear.  Urinalysis was significant for protein and glucose, but no signs for infection.  CT scan of the abdomen pelvis was also done that did not show any signs of hydronephrosis or ureteral calculus. Workup notable for left ear otitis externa started on clindamycin and  Rocephin  admitted to [facility] service with cardiology evaluation for elevated troponin.   Assessment and Plan: Elevated troponin Presented with complaints of generalized malaise and substernal chest pain.. Found to have high-sensitivity troponins at the outside facility374-> 358-> 350  and delta at  hsTrop 16->8. High-sensitivity troponin elevated> 2300 Echocardiogram 8/25 LVEF of 60 to 65%, grade 1 diastolic dysfunction, normal RV, no significant valvular disease. Cardiology advised no further cardiac intervention due to her age, kidney dysfunction, poor functional level of activity. Heparin  drip stopped. Continue medical therapy with Aspirin  + statin.   Hypertensive urgency Held beta-blocker due to bradycardia Continue hydralazine  and Cardizem . imdur  per cardiology. Hold ARB/ hydrochlorothiazide  due to kidney dysfunction.   Acute metabolic encephalopathy Patient presented more lethargic and altered.  No focal deficits appreciated on physical exam. CT scan of the head unremarkable. Her mentation back to baseline. She is weak, need SNF placement.   Heart failure with preserved ejection fraction Diuretic held as she is not hypervolemic, and with her kidney dysfunction. Strict I&O's and daily weights   Acute kidney injury superimposed on chronic kidney disease IIIb Patient presented to the outside facility with creatinine elevated up to 3.31 at the outside facility.  Repeat check here noted to be 2.66 with BUN 53.  Baseline creatinine previously noted to be around 1.5- 2. Nephrology evaluated her advised to hold nephrotoxic drugs. Monitor renal function as outpatient. Renal function improving. She will need to follow nephrology as outpatient.   Elevated d-dimer High probable PE- V/Q scan showed perfusion defect, started Eliquis  therapy. She will need outpatient doppler study R/O DVT.   Otitis  externa At the outside facility patient had been given clindamycin and Rocephin . Ciprofloxacin   eardrops twice daily Zosyn  IV changed to Augmentin  to finish 7 days. ENT follow up as outpatient.   Prolonged QT interval QT prolonged at 508. Avoid any QT prolonging medications.   Hypokalemia- Improved with replacements.   Controlled diabetes mellitus type 2, without long-term use of insulin  Hemoglobin A1c 6.5 Resumed Jardiance  therapy. Hypoglycemia protocol.   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 PT/ OT advised SNF placement.   Obesity Class II- Diet, exercise and weight reduction.        Consultants: Nephrology, Cardiology. Procedures performed: none  Disposition: Skilled nursing facility Diet recommendation:  Discharge Diet Orders (From admission, onward)     Start     Ordered   08/07/24 0000  Diet - low sodium heart healthy        08/07/24 1236   08/07/24 0000  Diet Carb Modified        08/07/24 1236           Cardiac and Carb modified diet DISCHARGE MEDICATION: Allergies as of 08/07/2024       Reactions   Cefdinir Swelling, Rash   Aleve [naproxen Sodium] Other (See Comments)   Heart races   Dorethia Larry ] Other (See Comments)   Nose bleeding   Ciprofloxacin  Other (See Comments)   Possible hamstring tendinopathy   Clonidine  Derivatives Other (See Comments)   Dizziness and weakness   Shellfish Allergy Nausea And Vomiting        Medication List     PAUSE taking these medications    furosemide  20 MG tablet Wait to take this until: August 15, 2024 Commonly known as: LASIX  Take 20 mg by mouth daily.       STOP taking these medications    azithromycin  250 MG tablet Commonly known as: ZITHROMAX    hydrochlorothiazide  25 MG tablet Commonly known as: HYDRODIURIL    isosorbide  dinitrate 10 MG tablet Commonly known as: ISORDIL    Nebivolol  HCl 20 MG Tabs   olmesartan  40 MG tablet Commonly  known as: BENICAR        TAKE these medications    acetaminophen  500 MG tablet Commonly known as: TYLENOL  Take 2 tablets (1,000 mg total) by mouth every 6 (six) hours as needed for mild pain.   alendronate  70 MG tablet Commonly known as: FOSAMAX  TAKE 1 TABLET WEEKLY (TAKE WITH 8OZ OF WATER  30 MINUTES BEFORE BREAKFAST)   amoxicillin -clavulanate 500-125 MG tablet Commonly known as: AUGMENTIN  Take 1 tablet by mouth 2 (two) times daily for 2 days.   apixaban  5 MG Tabs tablet Commonly known as: ELIQUIS  Take 2 tablets (10 mg total) by mouth 2 (two) times daily for 7 days, THEN 1 tablet (5 mg total) 2 (two) times daily. Start taking on: August 07, 2024   aspirin  EC 81 MG tablet Take 1 tablet (81 mg total) by mouth daily. Swallow whole. Start taking on: August 08, 2024   atorvastatin  20 MG tablet Commonly known as: LIPITOR  Take 2 tablets (40 mg total) by mouth every evening. What changed: how much to take   azelastine  0.1 % nasal spray Commonly known as: ASTELIN  Place 1 spray into both nostrils 2 (two) times daily. Use in each nostril as directed   ciprofloxacin -dexamethasone  OTIC suspension Commonly known as: CIPRODEX  Place 4 drops into the left ear 2 (two) times daily.   colchicine  0.6  MG tablet Take twice daily for gout attack. (may take every two hours up to 6 doses at acute onset)   diltiazem  240 MG 24 hr capsule Commonly known as: CARDIZEM  CD TAKE ONE CAPSULE BY MOUTH DAILY   empagliflozin  10 MG Tabs tablet Commonly known as: Jardiance  Take 1 tablet (10 mg total) by mouth daily.   ferrous sulfate  325 (65 FE) MG tablet Take 325 mg by mouth 2 (two) times a week.   hydrALAZINE  25 MG tablet Commonly known as: APRESOLINE  TAKE ONE (1) TABLET BY MOUTH 3 TIMES DAILY What changed: See the new instructions.   isosorbide  mononitrate 60 MG 24 hr tablet Commonly known as: IMDUR  Take 1 tablet (60 mg total) by mouth daily. Start taking on: August 08, 2024    NEOMYCIN -POLYMYXIN-HYDROCORTISONE 1 % Soln OTIC solution Commonly known as: CORTISPORIN Place 3 drops into the left ear every 4 (four) hours.   ondansetron  4 MG tablet Commonly known as: Zofran  Take 1 tablet (4 mg total) by mouth every 8 (eight) hours as needed for nausea or vomiting.        Discharge Exam: Filed Weights   08/05/24 0459 08/06/24 0443 08/07/24 0358  Weight: 100.7 kg 101 kg 100.4 kg      08/07/2024   11:55 AM 08/07/2024   10:00 AM 08/07/2024    8:13 AM  Vitals with BMI  Systolic 141 150 825  Diastolic 74 71 87  Pulse 70  67    General - Elderly African American female, no acute distress. HEENT - PERRLA, EOMI, atraumatic head, non tender sinuses. Lung - Clear, basal rales, rhonchi, no wheezes. Heart - S1, S2 heard, no murmurs, rubs, trace pedal edema. Abdomen - Soft, non tender, bowel sounds good Neuro - alert, awake and oriented, non focal exam. Skin - Warm and dry. Left ear dressing noted.  Condition at discharge: stable  The results of significant diagnostics from this hospitalization (including imaging, microbiology, ancillary and laboratory) are listed below for reference.   Imaging Studies: NM Pulmonary Perfusion Result Date: 08/06/2024 EXAM: NM Lung Perfusion Scan. CLINICAL HISTORY: Pulmonary embolism (PE) suspected, high prob. TECHNIQUE: Radiolabeled 4.36 mCi Tc49m MAA (technetium albumin  aggregated injection solution) was administered intravenously via the right forearm. Planar images of the lungs were obtained in multiple projections. RADIOPHARMACEUTICAL: 4.36 millicurie technetium to 49m albumin  aggregated (MAA). COMPARISON: 08/06/2024 chest radiograph. FINDINGS: PERFUSION: Small vaguely wedge-shaped perfusion defect in the anterior right mid lung without radiographic correlate. Otherwise, no perfusion defects. IMPRESSION: 1. Small vaguely wedge-shaped perfusion defect in the anterior right mid lung without radiographic correlate; segmental pulmonary  embolism cannot be excluded. Otherwise no perfusion defects. Electronically signed by: Selinda Blue MD 08/06/2024 04:47 PM EDT RP Workstation: [id number]   DG CHEST PORT 1 VIEW Result Date: 08/06/2024 EXAM: 1 VIEW XRAY OF THE CHEST 08/06/2024 03:07:00 PM COMPARISON: 08/01/2024 CLINICAL HISTORY: Abnormal perfusion scan of lung FINDINGS: LUNGS AND PLEURA: No overt pulmonary edema. Low lung volumes. No focal pulmonary opacity. No pleural effusion. No pneumothorax. HEART AND MEDIASTINUM: Cardiomegaly. Aortic atherosclerosis. BONES AND SOFT TISSUES: No acute osseous abnormality. IMPRESSION: 1. No acute findings. Low lung volumes. 2. Cardiomegaly without overt edema. Electronically signed by: Selinda Blue MD 08/06/2024 03:45 PM EDT RP Workstation: [id number]   CT HEAD WO CONTRAST ( ) Result Date: 08/01/2024 CLINICAL DATA:  Initial evaluation for acute mental status change. EXAM: CT HEAD WITHOUT CONTRAST TECHNIQUE: Contiguous axial images were obtained from the base of the skull through the vertex without intravenous contrast. RADIATION DOSE REDUCTION:  This exam was performed according to the departmental dose-optimization program which includes automated exposure control, adjustment of the mA and/or kV according to patient size and/or use of iterative reconstruction technique. COMPARISON:  CT from 12/12/2019 FINDINGS: Brain: Generalized age-related cerebral atrophy. Patchy hypodensity involving the supratentorial cerebral white matter, consistent with chronic small vessel ischemic disease, moderate in nature. Scattered remote lacunar infarcts present about the bilateral basal ganglia, thalami, and pons. No acute intracranial hemorrhage. No acute large vessel territory infarct. No mass lesion, midline shift or mass effect. No hydrocephalus or extra-axial fluid collection. Vascular: No abnormal hyperdense vessel. Calcified atherosclerosis present at skull base. Skull: Scalp soft tissues within normal limits.  Calvarium  intact. Sinuses/Orbits: Globes and orbital soft tissues within normal limits. Changes of chronic right maxillary sinusitis noted. Trace left mastoid effusion noted, of doubtful significance. Other: None. IMPRESSION: 1. No acute intracranial abnormality. 2. Generalized age-related cerebral atrophy with moderate chronic small vessel ischemic disease, with multiple remote lacunar infarcts about the bilateral basal ganglia, thalami, and pons. Electronically Signed   By: Morene Hoard M.D.   On: 08/01/2024 19:03   ECHOCARDIOGRAM COMPLETE Result Date: 08/01/2024    ECHOCARDIOGRAM REPORT   Patient Name:   Beth [name] Date of Exam: 08/01/2024 Medical Rec #:  [id number]    Height:       65.0 in Accession #:    [id number]   Weight:       231.0 lb Date of Birth:  1946-05-21    BSA:          2.103 m Patient Age:    78 years     BP:           181/96 mmHg Patient Gender: F            HR:           57 bpm. Exam Location:  Inpatient Procedure: 2D Echo, Cardiac Doppler and Color Doppler (Both Spectral and Color            Flow Doppler were utilized during procedure). Indications:    Elevated Troponin  History:        Patient has prior history of Echocardiogram examinations, most                 recent 03/01/2016. CHF, CKD, stage 4; Risk Factors:Hypertension,                 Diabetes and Dyslipidemia.  Sonographer:    Thea Norlander RCS Referring Phys: MAXIMINO DELENA SHARPS  Sonographer Comments: Technically difficult study due to poor echo windows, suboptimal parasternal window, suboptimal apical window and suboptimal subcostal window. IMPRESSIONS  1. Left ventricular ejection fraction, by estimation, is 60 to 65%. The left ventricle has normal function. The left ventricle has no regional wall motion abnormalities. Left ventricular diastolic parameters are consistent with Grade I diastolic dysfunction (impaired relaxation).  2. Right ventricular systolic function is normal. The right ventricular size is normal.  3. The mitral  valve is normal in structure. No evidence of mitral valve regurgitation. No evidence of mitral stenosis.  4. The aortic valve is tricuspid. Aortic valve regurgitation is not visualized. Aortic valve sclerosis/calcification is present, without any evidence of aortic stenosis. Aortic valve Vmax measures 1.25 m/s.  5. The inferior vena cava is normal in size with greater than 50% respiratory variability, suggesting right atrial pressure of 3 mmHg. FINDINGS  Left Ventricle: Left ventricular ejection fraction, by estimation, is 60 to 65%. The left ventricle has  normal function. The left ventricle has no regional wall motion abnormalities. The left ventricular internal cavity size was normal in size. There is  no left ventricular hypertrophy. Left ventricular diastolic parameters are consistent with Grade I diastolic dysfunction (impaired relaxation). Normal left ventricular filling pressure. Right Ventricle: The right ventricular size is normal. No increase in right ventricular wall thickness. Right ventricular systolic function is normal. Left Atrium: Left atrial size was normal in size. Right Atrium: Right atrial size was normal in size. Pericardium: There is no evidence of pericardial effusion. Mitral Valve: The mitral valve is normal in structure. No evidence of mitral valve regurgitation. No evidence of mitral valve stenosis. Tricuspid Valve: The tricuspid valve is normal in structure. Tricuspid valve regurgitation is trivial. No evidence of tricuspid stenosis. Aortic Valve: The aortic valve is tricuspid. Aortic valve regurgitation is not visualized. Aortic valve sclerosis/calcification is present, without any evidence of aortic stenosis. Aortic valve peak gradient measures 6.2 mmHg. Pulmonic Valve: The pulmonic valve was normal in structure. Pulmonic valve regurgitation is not visualized. No evidence of pulmonic stenosis. Aorta: The aortic root is normal in size and structure. Venous: The inferior vena cava is  normal in size with greater than 50% respiratory variability, suggesting right atrial pressure of 3 mmHg. IAS/Shunts: No atrial level shunt detected by color flow Doppler.  LEFT VENTRICLE PLAX 2D LVOT diam:     1.90 cm   Diastology LV SV:         50        LV e' medial:    4.35 cm/s LV SV Index:   24        LV E/e' medial:  13.2 LVOT Area:     2.84 cm  LV e' lateral:   6.53 cm/s                          LV E/e' lateral: 8.8  RIGHT VENTRICLE             IVC RV S prime:     21.90 cm/s  IVC diam: 1.70 cm TAPSE (M-mode): 1.7 cm LEFT ATRIUM           Index        RIGHT ATRIUM           Index LA Vol (A2C): 61.5 ml 29.24 ml/m  RA Area:     12.70 cm                                    RA Volume:   20.10 ml  9.56 ml/m  AORTIC VALVE AV Area (Vmax): 1.77 cm AV Vmax:        125.00 cm/s AV Peak Grad:   6.2 mmHg LVOT Vmax:      78.10 cm/s LVOT Vmean:     49.800 cm/s LVOT VTI:       0.178 m  AORTA Ao Root diam: 2.90 cm Ao Asc diam:  3.50 cm MITRAL VALVE MV Area (PHT): 2.62 cm    SHUNTS MV Decel Time: 289 msec    Systemic VTI:  0.18 m MV E velocity: 57.60 cm/s  Systemic Diam: 1.90 cm MV A velocity: 83.30 cm/s MV E/A ratio:  0.69 Wilbert Bihari MD Electronically signed by Wilbert Bihari MD Signature Date/Time: 08/01/2024/5:27:37 PM    Final    DG CHEST PORT 1 VIEW Result Date: 08/01/2024 CLINICAL DATA:  Shortness of breath EXAM: PORTABLE CHEST 1 VIEW COMPARISON:  03/31/2014 FINDINGS: Low lung volumes accentuate cardiomediastinal silhouette and pulmonary vascularity. No focal consolidation, pleural effusion, or pneumothorax. No displaced rib fractures. IMPRESSION: Hypoventilation.  No acute cardiopulmonary process. Electronically Signed   By: Norman Gatlin M.D.   On: 08/01/2024 14:26    Microbiology: Results for orders placed or performed during the hospital encounter of 08/01/24  Resp panel by RT-PCR (RSV, Flu A&B, Covid) Anterior Nasal Swab     Status: None   Collection Time: 08/01/24  7:28 AM   Specimen: Anterior Nasal  Swab  Result Value Ref Range Status   SARS Coronavirus 2 by RT PCR NEGATIVE NEGATIVE Final   Influenza A by PCR NEGATIVE NEGATIVE Final   Influenza B by PCR NEGATIVE NEGATIVE Final    Comment: (NOTE) The Xpert Xpress SARS-CoV-2/FLU/RSV plus assay is intended as an aid in the diagnosis of influenza from Nasopharyngeal swab specimens and should not be used as a sole basis for treatment. Nasal washings and aspirates are unacceptable for Xpert Xpress SARS-CoV-2/FLU/RSV testing.  Fact Sheet for Patients: BloggerCourse.com  Fact Sheet for Healthcare Providers: SeriousBroker.it  This test is not yet approved or cleared by the [place]  FDA and has been authorized for detection and/or diagnosis of SARS-CoV-2 by FDA under an Emergency Use Authorization (EUA). This EUA will remain in effect (meaning this test can be used) for the duration of the COVID-19 declaration under Section 564(b)(1) of the Act, 21 U.S.C. section 360bbb-3(b)(1), unless the authorization is terminated or revoked.     Resp Syncytial Virus by PCR NEGATIVE NEGATIVE Final    Comment: (NOTE) Fact Sheet for Patients: BloggerCourse.com  Fact Sheet for Healthcare Providers: SeriousBroker.it  This test is not yet approved or cleared by the [place]  FDA and has been authorized for detection and/or diagnosis of SARS-CoV-2 by FDA under an Emergency Use Authorization (EUA). This EUA will remain in effect (meaning this test can be used) for the duration of the COVID-19 declaration under Section 564(b)(1) of the Act, 21 U.S.C. section 360bbb-3(b)(1), unless the authorization is terminated or revoked.  Performed at [facility], [address], [place], [place] [address]   Culture, blood (Routine X 2) w Reflex to ID Panel     Status: None   Collection Time: 08/01/24 12:11 PM   Specimen: BLOOD LEFT HAND  Result  Value Ref Range Status   Specimen Description BLOOD LEFT HAND  Final   Special Requests   Final    BOTTLES DRAWN AEROBIC ONLY Blood Culture adequate volume   Culture   Final    NO GROWTH 5 DAYS Performed at [facility], [address], [place], [place] [address]    Report Status 08/06/2024 FINAL  Final  Culture, blood (Routine X 2) w Reflex to ID Panel     Status: None   Collection Time: 08/01/24 12:14 PM   Specimen: BLOOD RIGHT HAND  Result Value Ref Range Status   Specimen Description BLOOD RIGHT HAND  Final   Special Requests   Final    BOTTLES DRAWN AEROBIC ONLY Blood Culture results may not be optimal due to an inadequate volume of blood received in culture bottles   Culture   Final    NO GROWTH 5 DAYS Performed at [facility], [address] [place], [place] [address]    Report Status 08/06/2024 FINAL  Final  Respiratory (~20 pathogens) panel by PCR     Status:  None   Collection Time: 08/02/24  6:25 PM   Specimen: Nasopharyngeal Swab; Respiratory  Result Value Ref Range Status   Adenovirus NOT DETECTED NOT DETECTED Final   Coronavirus 229E NOT DETECTED NOT DETECTED Final    Comment: (NOTE) The Coronavirus on the Respiratory Panel, DOES NOT test for the novel  Coronavirus (2019 nCoV)    Coronavirus HKU1 NOT DETECTED NOT DETECTED Final   Coronavirus NL63 NOT DETECTED NOT DETECTED Final   Coronavirus OC43 NOT DETECTED NOT DETECTED Final   Metapneumovirus NOT DETECTED NOT DETECTED Final   Rhinovirus / Enterovirus NOT DETECTED NOT DETECTED Final   Influenza A NOT DETECTED NOT DETECTED Final   Influenza B NOT DETECTED NOT DETECTED Final   Parainfluenza Virus 1 NOT DETECTED NOT DETECTED Final   Parainfluenza Virus 2 NOT DETECTED NOT DETECTED Final   Parainfluenza Virus 3 NOT DETECTED NOT DETECTED Final   Parainfluenza Virus 4 NOT DETECTED NOT DETECTED Final   Respiratory Syncytial Virus NOT DETECTED NOT DETECTED Final   Bordetella pertussis NOT DETECTED NOT  DETECTED Final   Bordetella Parapertussis NOT DETECTED NOT DETECTED Final   Chlamydophila pneumoniae NOT DETECTED NOT DETECTED Final   Mycoplasma pneumoniae NOT DETECTED NOT DETECTED Final    Comment: Performed at [facility], [address], [place], [place] [address]    Labs: CBC: Recent Labs  Lab 08/01/24 [phone] 08/02/24 0057 08/03/24 0241 08/04/24 0229 08/05/24 0217 08/06/24 0244 08/07/24 0228  WBC 7.7   < > 9.3 10.6* 9.8 10.1 8.7  NEUTROABS 5.8  --   --   --   --   --   --   HGB 12.8   < > 13.1 12.3 11.2* 10.8* 11.1*  HCT 40.3   < > 41.1 38.9 35.7* 34.7* 35.8*  MCV 82.8   < > 82.5 82.8 83.4 84.2 85.2  PLT 269   < > 303 291 259 250 275   < > = values in this interval not displayed.   Basic Metabolic Panel: Recent Labs  Lab 08/01/24 0924 08/02/24 0056 08/02/24 0057 08/03/24 0241 08/04/24 0229 08/05/24 0217 08/06/24 0244 08/07/24 0228  NA 134*  --    < > 141 138 138 136 136  K 3.2*  --    < > 3.2* 3.4* 4.2 3.9 3.8  CL 97*  --    < > 102 105 105 104 106  CO2 22  --    < > 21* 22 20* 18* 20*  GLUCOSE 149*  --    < > 159* 166* 166* 154* 140*  BUN 53*  --    < > 53* 54* 56* 56* 51*  CREATININE 2.66*  --    < > 3.45* 3.22* 3.50* 3.26* 2.99*  CALCIUM  8.5*  --    < > 8.7* 8.5* 8.4* 8.2* 8.4*  MG 2.3 2.4  --   --   --   --   --   --    < > = values in this interval not displayed.   Liver Function Tests: Recent Labs  Lab 08/01/24 0924  AST 17  ALT 11  ALKPHOS 76  BILITOT 1.3*  PROT 6.3*  ALBUMIN  2.9*   CBG: Recent Labs  Lab 08/06/24 1604 08/06/24 2114 08/07/24 0653 08/07/24 0816 08/07/24 1157  GLUCAP 131* 202* 109* 135* 203*    Discharge time spent: 38 minutes.  Signed: Concepcion Riser, MD [facility] 08/07/2024

## 2024-08-07 NOTE — Progress Notes (Incomplete)
 Progress Note   Patient: Beth [name] FMW:[id number] DOB: 07/30/1946 DOA: 08/01/2024     6 DOS: the patient was seen and examined on 08/07/2024   Brief hospital course: Beth [name] is a 78 y.o. female with medical history significant of hypertension, hyperlipidemia, diastolic CHF, CVA, diabetes mellitus type 2, and chronic kidney disease stage IIIb presents with elevated blood pressure, earache, throat ache, headache, nausea, and weakness for further evaluation.  Outside facility labs from 8/23 noted WBC 9.1, hemoglobin 13.5, sodium 133, potassium 3.3, chloride 95, BUN 65, creatinine 3.31, proBNP 1649,  high-sensitivity troponin 374-> 358-> 350,  and delta at  hsTrop 16->8.  CT scan of the head showed no acute intercranial abnormality with opacification of the right maxillary sinus that was new.  Chest x-ray noted mild cardiomegaly with left ventricular prominence with lungs otherwise noted to be clear.  Urinalysis was significant for protein and glucose, but no signs for infection.  CT scan of the abdomen pelvis was also done that did not show any signs of hydronephrosis or ureteral calculus. Workup notable for left ear otitis externa started on clindamycin and Rocephin  admitted to TRH service with cardiology evaluation for elevated troponin.  Assessment and Plan: Elevated troponin Presented with complaints of generalized malaise and substernal chest pain.. Found to have high-sensitivity troponins at the outside facility374-> 358-> 350  and delta at  hsTrop 16->8. High-sensitivity troponin elevated> 2300 Echocardiogram 8/25 LVEF of 60 to 65%, grade 1 diastolic dysfunction, normal RV, no significant valvular disease. Cardiology advised no further cardiac intervention due to her age, kidney dysfunction, poor functional level of activity. Heparin  drip stopped. Continue medical therapy with Aspirin  + statin.   Hypertensive urgency Held beta-blocker due to bradycardia Continue hydralazine  and  Cardizem . imdur  per cardiology. Hold ARB/ hydrochlorothiazide  due to kidney dysfunction. Hydralazine  IV as needed for elevated blood pressure   Acute metabolic encephalopathy Patient presented more lethargic and altered.  No focal deficits appreciated on physical exam. CT scan of the head unremarkable. Her mentation back to baseline. She is weak, need SNF placement.  Heart failure with preserved ejection fraction Hold diuretic as she is not hypervolemic, and with her kidney dysfunction. Strict I&O's and daily weights Follow-up echocardiogram   Acute kidney injury superimposed on chronic kidney disease IIIb Patient presented to the outside facility with creatinine elevated up to 3.31 at the outside facility.  Repeat check here noted to be 2.66 with BUN 53.  Baseline creatinine previously noted to be around 1.5- 2. Very gentle IV fluids for now. Monitor daily renal function. Nephrology evaluation called, follow recommendations.  Elevated d-dimer- Right calf pain- Check right leg DVT Study. V/Q scan rule out PE ordered.   Otitis externa At the outside facility patient had been given clindamycin and Rocephin . Ciprofloxacin  eardrops twice daily Zosyn  IV changed to Augmentin  to finish 7 days. Aspiration precautions with elevation head of the bed. Nystatin  swish and swallow ordered. SLP advised regular diet.   Prolonged QT interval QT prolonged at 508. Repeat EKG for repeat QT check. Avoid further QT prolonging medications.  Hypokalemia- Improved with replacements.   Controlled diabetes mellitus type 2, without long-term use of insulin  Hemoglobin A1c 6.5 Accuchecks, sliding scale per protocol. Hypoglycemia protocol.   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 PT/ OT advised SNF placement. Per TOC she has bed available.  Obesity Class  II- Diet, exercise and weight  reduction.  {Tip this will not be part of the note when signed Body mass index is 36.83 kg/m. , ,  (Optional):26781}   Out of bed to chair. Incentive spirometry. Nursing supportive care. Fall, aspiration precautions. Diet:  Diet Orders (From admission, onward)     Start     Ordered   08/01/24 0727  Diet heart healthy/carb modified Room service appropriate? Yes; Fluid consistency: Thin  Diet effective now       Question Answer Comment  Diet-HS Snack? Nothing   Room service appropriate? Yes   Fluid consistency: Thin      08/01/24 0728           DVT prophylaxis:  apixaban  (ELIQUIS ) tablet 10 mg  apixaban  (ELIQUIS ) tablet 5 mg  Level of care: Telemetry Cardiac   Code Status: Full Code  Subjective: Patient is seen and examined today morning. She does have ear pain. Eating well. Did get out of bed with lot of help. Admits right calf pain   Physical Exam: Vitals:   08/07/24 0813 08/07/24 1000 08/07/24 1155 08/07/24 1200  BP: (!) 174/87 (!) 150/71 (!) 141/74 (!) 146/74  Pulse: 67  70 71  Resp: 20  18 (!) 23  Temp: 98.3 F (36.8 C)  98 F (36.7 C)   TempSrc: Oral  Oral   SpO2: 96%  95% 94%  Weight:      Height:        General - Elderly African American female, no acute distress. HEENT - PERRLA, EOMI, atraumatic head, non tender sinuses. Lung - Clear, basal rales, rhonchi, no wheezes. Heart - S1, S2 heard, no murmurs, rubs, trace pedal edema. Abdomen - Soft, non tender, bowel sounds good Neuro - alert, awake and oriented, non focal exam. Skin - Warm and dry. Left ear dressing noted.  Data Reviewed:      Latest Ref Rng & Units 08/07/2024    2:28 AM 08/06/2024    2:44 AM 08/05/2024    2:17 AM  CBC  WBC 4.0 - 10.5 K/uL 8.7  10.1  9.8   Hemoglobin 12.0 - 15.0 g/dL 88.8  89.1  88.7   Hematocrit 36.0 - 46.0 % 35.8  34.7  35.7   Platelets 150 - 400 K/uL 275  250  259       Latest Ref Rng & Units 08/07/2024    2:28 AM 08/06/2024     2:44 AM 08/05/2024    2:17 AM  BMP  Glucose 70 - 99 mg/dL 859  845  833   BUN 8 - 23 mg/dL 51  56  56   Creatinine 0.44 - 1.00 mg/dL 7.00  6.73  6.49   Sodium 135 - 145 mmol/L 136  136  138   Potassium 3.5 - 5.1 mmol/L 3.8  3.9  4.2   Chloride 98 - 111 mmol/L 106  104  105   CO2 22 - 32 mmol/L 20  18  20    Calcium  8.9 - 10.3 mg/dL 8.4  8.2  8.4    NM Pulmonary Perfusion Result Date: 08/06/2024 EXAM: NM Lung Perfusion Scan. CLINICAL HISTORY: Pulmonary embolism (PE) suspected, high prob. TECHNIQUE: Radiolabeled 4.36 mCi Tc47m MAA (technetium albumin  aggregated injection solution) was administered intravenously via the right forearm. Planar images of the lungs were obtained in multiple projections. RADIOPHARMACEUTICAL: 4.36 millicurie technetium to 18m albumin  aggregated (MAA). COMPARISON: 08/06/2024 chest radiograph. FINDINGS: PERFUSION: Small vaguely wedge-shaped perfusion defect in the anterior right mid lung without radiographic correlate. Otherwise, no perfusion  defects. IMPRESSION: 1. Small vaguely wedge-shaped perfusion defect in the anterior right mid lung without radiographic correlate; segmental pulmonary embolism cannot be excluded. Otherwise no perfusion defects. Electronically signed by: Selinda Blue MD 08/06/2024 04:47 PM EDT RP Workstation: [id number]   DG CHEST PORT 1 VIEW Result Date: 08/06/2024 EXAM: 1 VIEW XRAY OF THE CHEST 08/06/2024 03:07:00 PM COMPARISON: 08/01/2024 CLINICAL HISTORY: Abnormal perfusion scan of lung FINDINGS: LUNGS AND PLEURA: No overt pulmonary edema. Low lung volumes. No focal pulmonary opacity. No pleural effusion. No pneumothorax. HEART AND MEDIASTINUM: Cardiomegaly. Aortic atherosclerosis. BONES AND SOFT TISSUES: No acute osseous abnormality. IMPRESSION: 1. No acute findings. Low lung volumes. 2. Cardiomegaly without overt edema. Electronically signed by: Selinda Blue MD 08/06/2024 03:45 PM EDT RP Workstation: [id number]     Family Communication: Discussed with  patient's daughters at bedside, understand and agree. All questions answered.  Disposition: Status is: Inpatient Remains inpatient appropriate because: , AKI, elevated trop, debility.  Planned Discharge Destination: Home with Home Health  {Tip this will not be part of the note when signed  DVT Prophylaxis  .Apixaban  (eliquis ) tablet 10 mg  Apixaban  (eliquis ) tablet 5 mg ,  (Optional):26781}   Time spent: 45 minutes  Author: Concepcion Riser, MD 08/07/2024 2:55 PM Secure chat 7am to 7pm For on call review [email or web address].

## 2024-08-07 NOTE — TOC Transition Note (Signed)
 Transition of Care [facility]) - Discharge Note   Patient Details  Name: Beth [name] MRN: [id number] Date of Birth: 07/28/1946  Transition of Care [facility]) CM/SW Contact:  Olam FORBES Ally, LCSW Phone Number: 08/07/2024, 3:20 PM   Clinical Narrative:     Patient will DC to: [name] Anticipated DC date:?08/07/2024 Family notified: [name] Transport by: [facility]   Per MD patient ready for DC to [facility]. RN, patient, patient's family, and facility notified of DC. Discharge Summary sent to facility. RN given number for report [phone] room 908p.DC packet on chart. Ambulance transport requested for patient.   CSW signing off.   Olam Ally, [place] [phone]     Barriers to Discharge: Continued Medical Work up, SNF Pending bed offer, Insurance Authorization   Patient Goals and CMS Choice Patient states their goals for this hospitalization and ongoing recovery are:: To go to rehab          Discharge Placement              Patient [facility] bed at[facility] Patient to be transferred to facility by: [facility]   Patient and family notified of of transfer: 08/07/24  Discharge Plan and Services Additional resources added to the After Visit Summary for   In-house Referral: Clinical Social Work                                   Social Drivers of Health (SDOH) Interventions SDOH Screenings   Food Insecurity: No Food Insecurity (08/01/2024)  Housing: Low Risk  (08/01/2024)  Transportation Needs: No Transportation Needs (08/01/2024)  Recent Concern: Transportation Needs - Unmet Transportation Needs (07/20/2024)   Received from [facility]  Utilities: Not At Risk (08/01/2024)  Alcohol Screen: Low Risk  (12/30/2023)  Depression (PHQ2-9): Medium Risk (07/20/2024)  Financial Resource Strain: Low Risk  (07/20/2024)   Received from [facility]  Physical Activity: Inactive (07/20/2024)   Received from [facility]  Social Connections: Moderately Integrated  (08/01/2024)  Recent Concern: Social Connections - Socially Isolated (07/20/2024)   Received [facility]  Stress: No Stress Concern Present (07/20/2024)   Received from [facility]  Tobacco Use: Low Risk  (08/02/2024)  Health Literacy: Low Risk  (07/20/2024)   Received from [facility]     Readmission Risk Interventions     No data to display

## 2024-08-07 NOTE — Plan of Care (Signed)
  Problem: Activity: Goal: Risk for activity intolerance will decrease Outcome: Not Progressing  Unable to stand for longer than few seconds due to right knee pain. Unable to transfer to chair due to weakness and knee pain.

## 2024-08-07 NOTE — Progress Notes (Signed)
 Nephrology Follow-Up Consult note   Assessment/Recommendations: Beth [name] is a/an 78 y.o. female with a past medical history significant for HTN diastolic HF, CVA, DM2, CKD 3 who presents to [facility] as transfer with malaise.    Non-Oliguric AKI on CKD (improving): Likely secondary to hypotension/relative hypotension  Renal function slightly improved today, however relatively unchanged. Would continue with supportive care Consider MIVF, if decreased p.o. intake.  -Chart reviewed: (medications acceptable, does not appear to have been exposed to nephrotoxins with imaging. Had episodes of significant hypotension).   -Continue to monitor daily Cr, Dose meds for GFR -Monitor Daily I/Os, Daily weight  -Maintain MAP>65 for optimal renal perfusion.  -Avoid nephrotoxic medications including NSAIDs -Use synthetic opioids (Fentanyl /Dilaudid ) if needed -Currently no indication for HD  Volume Status: Appears euvolemic on exam. Based on our examination and review of available imaging, our recommendation is as above.  Hypokalemia: Resolved.  Defer to primary.  Metabolic acidosis: Mild.  Monitor.   Recommendations conveyed to primary service.    [name] Ahad Colarusso [facility] 08/07/2024 8:32 AM  ___________________________________________________________  CC: Malaise  Interval History/Subjective: No major events reported overnight.  Seen in bed.  Doing well.  Pending discharge  Medications:  Current Facility-Administered Medications  Medication Dose Route Frequency Provider Last Rate Last Admin   acetaminophen  (TYLENOL ) tablet 650 mg  650 mg Oral Q6H PRN Smith, Rondell A, MD   650 mg at 08/06/24 9265   Or   acetaminophen  (TYLENOL ) suppository 650 mg  650 mg Rectal Q6H PRN Smith, Rondell A, MD       albuterol  (PROVENTIL ) (2.5 MG/3ML) 0.083% nebulizer solution 2.5 mg  2.5 mg Nebulization Q6H PRN Smith, Rondell A, MD       amoxicillin -clavulanate (AUGMENTIN ) 500-125 MG per  tablet 1 tablet  1 tablet Oral BID Sreeram, Narendranath, MD   1 tablet at 08/06/24 2204   apixaban  (ELIQUIS ) tablet 10 mg  10 mg Oral BID Gretel Prentice BIRCH, RPH       Followed by   NOREEN ON 08/14/2024] apixaban  (ELIQUIS ) tablet 5 mg  5 mg Oral BID Gretel Prentice BIRCH, [facility]       aspirin  EC tablet 81 mg  81 mg Oral Daily Zhao, Xika, NP   81 mg at 08/06/24 9062   atorvastatin  (LIPITOR ) tablet 80 mg  80 mg Oral QPM Zhao, Xika, NP   80 mg at 08/06/24 1718   ciprofloxacin -dexamethasone  (CIPRODEX ) 0.3-0.1 % OTIC (EAR) suspension 4 drop  4 drop Left EAR BID Claudene Reeves A, MD   4 drop at 08/06/24 2204   dextrose  50 % solution 50 mL  1 ampule Intravenous PRN Claudene Reeves LABOR, MD       diltiazem  (CARDIZEM  CD) 24 hr capsule 240 mg  240 mg Oral QPM Smith, Rondell A, MD   240 mg at 08/06/24 1718   hydrALAZINE  (APRESOLINE ) tablet 100 mg  100 mg Oral TID Smith, Rondell A, MD   100 mg at 08/06/24 2204   HYDROmorphone  (DILAUDID ) injection 0.5 mg  0.5 mg Intravenous Q4H PRN Darci Pore, MD       insulin  aspart (novoLOG ) injection 0-15 Units  0-15 Units Subcutaneous TID WC Sreeram, Narendranath, MD   2 Units at 08/06/24 1717   insulin  aspart (novoLOG ) injection 0-5 Units  0-5 Units Subcutaneous QHS Sreeram, Narendranath, MD   2 Units at 08/06/24 2203   isosorbide  mononitrate (IMDUR ) 24 hr tablet 60 mg  60 mg Oral Daily Lenon Marien CROME, MD   60  mg at 08/06/24 9062   nystatin  (MYCOSTATIN ) 100000 UNIT/ML suspension 500,000 Units  5 mL Oral QID Smith, Rondell A, MD   500,000 Units at 08/06/24 2203   Oral care mouth rinse  15 mL Mouth Rinse PRN Darci Pore, MD       oxyCODONE  (Oxy IR/ROXICODONE ) immediate release tablet 5 mg  5 mg Oral Q4H PRN Darci Pore, MD   5 mg at 08/06/24 1225   sodium chloride  flush (NS) 0.9 % injection 3 mL  3 mL Intravenous Q12H Smith, Rondell A, MD   3 mL at 08/06/24 2222   trimethobenzamide  (TIGAN ) injection 200 mg  200 mg Intramuscular Q6H PRN Claudene Reeves A, MD    200 mg at 08/06/24 1649      Review of Systems: 10 systems reviewed and negative except per interval history/subjective  Physical Exam: Vitals:   08/07/24 0358 08/07/24 0813  BP: (!) 158/71 (!) 174/87  Pulse: 60 67  Resp: (!) 21 20  Temp: 98.3 F (36.8 C) 98.3 F (36.8 C)  SpO2: 96% 96%   No intake/output data recorded.  Intake/Output Summary (Last 24 hours) at 08/07/2024 9167 Last data filed at 08/06/2024 1822 Gross per 24 hour  Intake 220 ml  Output 250 ml  Net -30 ml   General: Elderly-appearing, no acute distress CV: RRR, no peripheral edema Lungs: BLBS, normal work of breathing Abd: soft, non-tender, non-distended Psych: alert, engaged, appropriate mood and affect Musculoskeletal: no obvious deformities Neuro: normal speech, no gross focal deficits    Test Results I personally reviewed new and old clinical labs and radiology tests Lab Results  Component Value Date   NA 136 08/07/2024   K 3.8 08/07/2024   CL 106 08/07/2024   CO2 20 (L) 08/07/2024   BUN 51 (H) 08/07/2024   CREATININE 2.99 (H) 08/07/2024   CALCIUM  8.4 (L) 08/07/2024   ALBUMIN  2.9 (L) 08/01/2024    CBC Recent Labs  Lab 08/01/24 0924 08/02/24 0057 08/05/24 0217 08/06/24 0244 08/07/24 0228  WBC 7.7   < > 9.8 10.1 8.7  NEUTROABS 5.8  --   --   --   --   HGB 12.8   < > 11.2* 10.8* 11.1*  HCT 40.3   < > 35.7* 34.7* 35.8*  MCV 82.8   < > 83.4 84.2 85.2  PLT 269   < > 259 250 275   < > = values in this interval not displayed.

## 2024-08-07 NOTE — Progress Notes (Signed)
 PHARMACY - ANTICOAGULATION CONSULT NOTE  Pharmacy Consult for apixaban  Indication: possible PE  Allergies  Allergen Reactions   Cefdinir Swelling and Rash   Aleve [Naproxen Sodium] Other (See Comments)    Heart races   Dorethia Larsen ] Other (See Comments)    Nose bleeding   Ciprofloxacin  Other (See Comments)    Possible hamstring tendinopathy   Clonidine  Derivatives Other (See Comments)    Dizziness and weakness   Shellfish Allergy Nausea And Vomiting    Patient Measurements: Height: 5' 5 (165.1 cm) Weight: 100.4 kg (221 lb 5.5 oz) IBW/kg (Calculated) : 57  Vital Signs: Temp: 98.3 F (36.8 C) (08/31 0358) Temp Source: Oral (08/31 0358) BP: 158/71 (08/31 0358) Pulse Rate: 60 (08/31 0358)  Labs: Recent Labs    08/05/24 0217 08/06/24 0244 08/07/24 0228  HGB 11.2* 10.8* 11.1*  HCT 35.7* 34.7* 35.8*  PLT 259 250 275  CREATININE 3.50* 3.26* 2.99*    Estimated Creatinine Clearance: 17.6 mL/min (A) (by C-G formula based on SCr of 2.99 mg/dL (H)).   Medical History: Past Medical History:  Diagnosis Date   Anemia    Arthritis    Knee both knees   Blood transfusion without reported diagnosis 2012   anemia;pt denies transfusion stated was only on iron  tablet   Cataract    left   CKD (chronic kidney disease), stage III (HCC)    Diabetes mellitus without complication (HCC)    Family history of anesthesia complication    sister very slow to awaken after anesthesia;severe vomiting    Gout    left elbow   Herpes infection 08/09/2014   Saw doctor Wed. 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 90 tablet 1 Past Week   atorvastatin  (LIPITOR ) 20 MG tablet TAKE 1 TABLET BY MOUTH EVERY EVENING 90 tablet 1 07/30/2024   azelastine  (ASTELIN ) 0.1 % nasal spray Place 1 spray into both nostrils 2 (two) times daily. Use in each nostril as directed 30 mL 12 Past Week   [EXPIRED] azithromycin  (ZITHROMAX ) 250 MG tablet Take 2 tablets on day 1, then 1 tablet daily on days 2 through 5 6 tablet 0 Past Week   colchicine  0.6 MG tablet Take twice daily for gout attack. (may take every two hours up to 6 doses at acute onset) 60 tablet 2 Unknown   diltiazem  (CARDIZEM  CD) 240 MG 24 hr capsule TAKE ONE CAPSULE BY MOUTH DAILY 90 capsule 1 07/30/2024   furosemide  (LASIX ) 20 MG tablet Take 20 mg by mouth daily.   Past Week   hydrALAZINE  (APRESOLINE ) 25 MG tablet TAKE ONE (1) TABLET BY MOUTH 3 TIMES DAILY (Patient taking differently: Take 100 mg by mouth 3 (three) times daily.) 90 tablet 0 Past Week   hydrochlorothiazide  (HYDRODIURIL ) 25 MG tablet Take  25 mg by mouth daily.   Past Week   isosorbide  dinitrate (ISORDIL ) 10 MG tablet TAKE ONE (1) TABLET BY MOUTH 3 TIMES DAILY 270 tablet 0 Past Week   Nebivolol  HCl 20 MG TABS TAKE ONE (1) TABLET EACH DAY 90 tablet 1 Past Week   NEOMYCIN -POLYMYXIN-HYDROCORTISONE (CORTISPORIN) 1 % SOLN OTIC solution Place 3 drops into the left ear every 4 (four) hours.   Past Week   olmesartan  (BENICAR ) 40 MG tablet TAKE ONE (1) TABLET EACH DAY (Patient taking differently: Take 40 mg by mouth daily.) 90 tablet 0 Past Week   ondansetron  (ZOFRAN ) 4 MG tablet Take 1 tablet (4 mg total) by mouth every 8 (eight) hours as needed for nausea or vomiting. 20 tablet 0 Past Week   alendronate   (FOSAMAX ) 70 MG tablet TAKE 1 TABLET WEEKLY (TAKE WITH 8OZ OF WATER  30 MINUTES BEFORE BREAKFAST) (Patient not taking: Reported on 08/01/2024) 12 tablet 1 Not Taking   empagliflozin  (JARDIANCE ) 10 MG TABS tablet Take 1 tablet (10 mg total) by mouth daily. (Patient not taking: Reported on 07/25/2024) 90 tablet 3 Not Taking   ferrous sulfate  325 (65 FE) MG tablet Take 325 mg by mouth 2 (two) times a week. (Patient not taking: Reported on 08/01/2024)   Not Taking   Scheduled:   amoxicillin -clavulanate  1 tablet Oral BID   apixaban   10 mg Oral BID   Followed by   NOREEN ON 08/14/2024] apixaban   5 mg Oral BID   aspirin  EC  81 mg Oral Daily   atorvastatin   80 mg Oral QPM   ciprofloxacin -dexamethasone   4 drop Left EAR BID   diltiazem   240 mg Oral QPM   hydrALAZINE   100 mg Oral TID   insulin  aspart  0-15 Units Subcutaneous TID WC   insulin  aspart  0-5 Units Subcutaneous QHS   isosorbide  mononitrate  60 mg Oral Daily   nystatin   5 mL Oral QID   sodium chloride  flush  3 mL Intravenous Q12H    Assessment: 78 yo female with AKI on CKD. D-dimer= 2.94 and VQ could not exclude PE and venous doppler ordered.  Pharmacy consulted to dose apixaban  for PE  -Hg= 11.1, plt= 275  Goal of Therapy:  Monitor platelets by anticoagulation protocol: Yes   Plan:  -apixaban  10mg  po bid for 7 days followed by 5mg  po bid  Prentice Poisson, PharmD Clinical Pharmacist **Pharmacist phone directory can now be found on [email or web address] ([email or web address]).  Listed under [facility].

## 2024-08-07 NOTE — TOC Progression Note (Addendum)
 Transition of Care [facility]) - Progression Note    Patient Details  Name: Beth [name] MRN: [id number] Date of Birth: 07/24/1946  Transition [facility]) CM/SW Contact  Olam FORBES Ally, LCSW Phone Number: 08/07/2024, 1:05 PM  Clinical Narrative:     CSW called admission staff to alert them of discharge. CSW had to leave voice mail and awaiting a call back.  3:00 pm- CSW heard back from facility and they stated that they can accept patient back today.  TOC team will continue to assist with discharge planning needs.    Expected Discharge Plan: Skilled Nursing Facility Barriers to Discharge: Continued Medical Work up, SNF Pending bed offer, Insurance Authorization               Expected Discharge Plan and Services In-house Referral: Clinical Social Work     Living arrangements for the past 2 months: Single Family Home Expected Discharge Date: 08/07/24                                     Social Drivers of Health (SDOH) Interventions SDOH Screenings   Food Insecurity: No Food Insecurity (08/01/2024)  Housing: Low Risk  (08/01/2024)  Transportation Needs: No Transportation Needs (08/01/2024)  Recent Concern: Transportation Needs - Unmet Transportation Needs (07/20/2024)   Received from [facility]  Utilities: Not At Risk (08/01/2024)  Alcohol Screen: Low Risk  (12/30/2023)  Depression (PHQ2-9): Medium Risk (07/20/2024)  Financial Resource Strain: Low Risk  (07/20/2024)   Received from [facility]  Physical Activity: Inactive (07/20/2024)   Received from [facility]  Social Connections: Moderately Integrated (08/01/2024)  Recent Concern: Social Connections - Socially Isolated (07/20/2024)   Received from [facility]  Stress: No Stress Concern Present (07/20/2024)   Received from [facility]  Tobacco Use: Low Risk  (08/02/2024)  Health Literacy: Low Risk  (07/20/2024)   Received from [facility]    Readmission Risk Interventions     No data to  display

## 2024-08-07 NOTE — Discharge Instructions (Signed)
 Information on my medicine - ELIQUIS  (apixaban )  Why was Eliquis  prescribed for you? Eliquis  was prescribed to treat blood clots that may have been found in the veins of your legs (deep vein thrombosis) or in your lungs (pulmonary embolism) and to reduce the risk of them occurring again.  What do You need to know about Eliquis  ? The starting dose is 10 mg (two 5 mg tablets) taken TWICE daily for the FIRST SEVEN (7) DAYS, then on 08/14/2024 the dose is reduced to ONE 5 mg tablet taken TWICE daily.  Eliquis  may be taken with or without food.   Try to take the dose about the same time in the morning and in the evening. If you have difficulty swallowing the tablet whole please discuss with your pharmacist how to take the medication safely.  Take Eliquis  exactly as prescribed and DO NOT stop taking Eliquis  without talking to the doctor who prescribed the medication.  Stopping may increase your risk of developing a new blood clot.  Refill your prescription before you run out.  After discharge, you should have regular check-up appointments with your healthcare provider that is prescribing your Eliquis .    What do you do if you miss a dose? If a dose of ELIQUIS  is not taken at the scheduled time, take it as soon as possible on the same day and twice-daily administration should be resumed. The dose should not be doubled to make up for a missed dose.  Important Safety Information A possible side effect of Eliquis  is bleeding. You should call your healthcare provider right away if you experience any of the following: Bleeding from an injury or your nose that does not stop. Unusual colored urine (red or dark brown) or unusual colored stools (red or black). Unusual bruising for unknown reasons. A serious fall or if you hit your head (even if there is no bleeding).  Some medicines may interact with Eliquis  and might increase your risk of bleeding or clotting while on Eliquis . To help avoid this,  consult your healthcare provider or pharmacist prior to using any new prescription or non-prescription medications, including herbals, vitamins, non-steroidal anti-inflammatory drugs (NSAIDs) and supplements.  This website has more information on Eliquis  (apixaban ): [email or web address] .[email or web address] dena

## 2024-08-09 DIAGNOSIS — I5030 Unspecified diastolic (congestive) heart failure: Secondary | ICD-10-CM | POA: Diagnosis not present

## 2024-08-09 DIAGNOSIS — R7989 Other specified abnormal findings of blood chemistry: Secondary | ICD-10-CM | POA: Diagnosis not present

## 2024-08-09 DIAGNOSIS — E669 Obesity, unspecified: Secondary | ICD-10-CM | POA: Diagnosis not present

## 2024-08-09 DIAGNOSIS — I110 Hypertensive heart disease with heart failure: Secondary | ICD-10-CM | POA: Diagnosis not present

## 2024-08-10 ENCOUNTER — Other Ambulatory Visit (HOSPITAL_COMMUNITY): Payer: Self-pay

## 2024-08-12 DIAGNOSIS — I129 Hypertensive chronic kidney disease with stage 1 through stage 4 chronic kidney disease, or unspecified chronic kidney disease: Secondary | ICD-10-CM | POA: Diagnosis not present

## 2024-08-12 DIAGNOSIS — N184 Chronic kidney disease, stage 4 (severe): Secondary | ICD-10-CM | POA: Diagnosis not present

## 2024-08-12 DIAGNOSIS — E1122 Type 2 diabetes mellitus with diabetic chronic kidney disease: Secondary | ICD-10-CM | POA: Diagnosis not present

## 2024-08-12 DIAGNOSIS — Z7189 Other specified counseling: Secondary | ICD-10-CM | POA: Diagnosis not present

## 2024-08-14 ENCOUNTER — Emergency Department (HOSPITAL_COMMUNITY)

## 2024-08-14 ENCOUNTER — Encounter (HOSPITAL_COMMUNITY): Payer: Self-pay

## 2024-08-14 ENCOUNTER — Inpatient Hospital Stay (HOSPITAL_COMMUNITY)
Admission: EM | Admit: 2024-08-14 | Discharge: 2024-08-19 | DRG: 854 | Disposition: A | Discharge status: Skilled Nursing Facility | Source: Skilled Nursing Facility | Attending: Internal Medicine | Admitting: Internal Medicine

## 2024-08-14 ENCOUNTER — Other Ambulatory Visit: Payer: Self-pay

## 2024-08-14 DIAGNOSIS — Z886 Allergy status to analgesic agent status: Secondary | ICD-10-CM

## 2024-08-14 DIAGNOSIS — Z833 Family history of diabetes mellitus: Secondary | ICD-10-CM

## 2024-08-14 DIAGNOSIS — Z90710 Acquired absence of both cervix and uterus: Secondary | ICD-10-CM

## 2024-08-14 DIAGNOSIS — Z86711 Personal history of pulmonary embolism: Secondary | ICD-10-CM

## 2024-08-14 DIAGNOSIS — H6090 Unspecified otitis externa, unspecified ear: Secondary | ICD-10-CM | POA: Diagnosis present

## 2024-08-14 DIAGNOSIS — B028 Zoster with other complications: Principal | ICD-10-CM | POA: Diagnosis present

## 2024-08-14 DIAGNOSIS — Z7901 Long term (current) use of anticoagulants: Secondary | ICD-10-CM

## 2024-08-14 DIAGNOSIS — Z8673 Personal history of transient ischemic attack (TIA), and cerebral infarction without residual deficits: Secondary | ICD-10-CM

## 2024-08-14 DIAGNOSIS — Z1152 Encounter for screening for COVID-19: Secondary | ICD-10-CM

## 2024-08-14 DIAGNOSIS — I160 Hypertensive urgency: Secondary | ICD-10-CM | POA: Diagnosis present

## 2024-08-14 DIAGNOSIS — R29810 Facial weakness: Secondary | ICD-10-CM | POA: Diagnosis not present

## 2024-08-14 DIAGNOSIS — I10 Essential (primary) hypertension: Secondary | ICD-10-CM | POA: Diagnosis not present

## 2024-08-14 DIAGNOSIS — H6092 Unspecified otitis externa, left ear: Secondary | ICD-10-CM | POA: Diagnosis not present

## 2024-08-14 DIAGNOSIS — Z91013 Allergy to seafood: Secondary | ICD-10-CM

## 2024-08-14 DIAGNOSIS — Z881 Allergy status to other antibiotic agents status: Secondary | ICD-10-CM

## 2024-08-14 DIAGNOSIS — Z888 Allergy status to other drugs, medicaments and biological substances status: Secondary | ICD-10-CM

## 2024-08-14 DIAGNOSIS — G510 Bell's palsy: Principal | ICD-10-CM | POA: Diagnosis present

## 2024-08-14 DIAGNOSIS — H60502 Unspecified acute noninfective otitis externa, left ear: Principal | ICD-10-CM

## 2024-08-14 DIAGNOSIS — Z7983 Long term (current) use of bisphosphonates: Secondary | ICD-10-CM

## 2024-08-14 DIAGNOSIS — H538 Other visual disturbances: Secondary | ICD-10-CM | POA: Diagnosis present

## 2024-08-14 DIAGNOSIS — I130 Hypertensive heart and chronic kidney disease with heart failure and stage 1 through stage 4 chronic kidney disease, or unspecified chronic kidney disease: Secondary | ICD-10-CM | POA: Diagnosis present

## 2024-08-14 DIAGNOSIS — R059 Cough, unspecified: Secondary | ICD-10-CM | POA: Diagnosis not present

## 2024-08-14 DIAGNOSIS — I5030 Unspecified diastolic (congestive) heart failure: Secondary | ICD-10-CM | POA: Diagnosis present

## 2024-08-14 DIAGNOSIS — E119 Type 2 diabetes mellitus without complications: Secondary | ICD-10-CM

## 2024-08-14 DIAGNOSIS — I5032 Chronic diastolic (congestive) heart failure: Secondary | ICD-10-CM | POA: Diagnosis present

## 2024-08-14 DIAGNOSIS — A419 Sepsis, unspecified organism: Secondary | ICD-10-CM | POA: Diagnosis not present

## 2024-08-14 DIAGNOSIS — M109 Gout, unspecified: Secondary | ICD-10-CM | POA: Diagnosis present

## 2024-08-14 DIAGNOSIS — I509 Heart failure, unspecified: Secondary | ICD-10-CM | POA: Diagnosis not present

## 2024-08-14 DIAGNOSIS — R5381 Other malaise: Secondary | ICD-10-CM | POA: Diagnosis present

## 2024-08-14 DIAGNOSIS — H672 Otitis media in diseases classified elsewhere, left ear: Secondary | ICD-10-CM | POA: Diagnosis present

## 2024-08-14 DIAGNOSIS — N184 Chronic kidney disease, stage 4 (severe): Secondary | ICD-10-CM | POA: Diagnosis present

## 2024-08-14 DIAGNOSIS — Z96651 Presence of right artificial knee joint: Secondary | ICD-10-CM | POA: Diagnosis present

## 2024-08-14 DIAGNOSIS — Z8249 Family history of ischemic heart disease and other diseases of the circulatory system: Secondary | ICD-10-CM

## 2024-08-14 DIAGNOSIS — I6782 Cerebral ischemia: Secondary | ICD-10-CM | POA: Diagnosis not present

## 2024-08-14 DIAGNOSIS — Z79899 Other long term (current) drug therapy: Secondary | ICD-10-CM

## 2024-08-14 DIAGNOSIS — E785 Hyperlipidemia, unspecified: Secondary | ICD-10-CM | POA: Diagnosis present

## 2024-08-14 DIAGNOSIS — E1122 Type 2 diabetes mellitus with diabetic chronic kidney disease: Secondary | ICD-10-CM | POA: Diagnosis present

## 2024-08-14 DIAGNOSIS — Z89529 Acquired absence of unspecified knee: Secondary | ICD-10-CM | POA: Diagnosis not present

## 2024-08-14 DIAGNOSIS — Z7982 Long term (current) use of aspirin: Secondary | ICD-10-CM

## 2024-08-14 DIAGNOSIS — Z7984 Long term (current) use of oral hypoglycemic drugs: Secondary | ICD-10-CM

## 2024-08-14 LAB — CBC WITH DIFFERENTIAL/PLATELET
Abs Immature Granulocytes: 0.03 K/uL (ref 0.00–0.07)
Basophils Absolute: 0.1 K/uL (ref 0.0–0.1)
Basophils Relative: 1 %
Eosinophils Absolute: 0.3 K/uL (ref 0.0–0.5)
Eosinophils Relative: 5 %
HCT: 38.6 % (ref 36.0–46.0)
Hemoglobin: 11.8 g/dL — ABNORMAL LOW (ref 12.0–15.0)
Immature Granulocytes: 0 %
Lymphocytes Relative: 20 %
Lymphs Abs: 1.4 K/uL (ref 0.7–4.0)
MCH: 26.5 pg (ref 26.0–34.0)
MCHC: 30.6 g/dL (ref 30.0–36.0)
MCV: 86.7 fL (ref 80.0–100.0)
Monocytes Absolute: 0.5 K/uL (ref 0.1–1.0)
Monocytes Relative: 8 %
Neutro Abs: 4.4 K/uL (ref 1.7–7.7)
Neutrophils Relative %: 66 %
Platelets: 348 K/uL (ref 150–400)
RBC: 4.45 MIL/uL (ref 3.87–5.11)
RDW: 15.9 % — ABNORMAL HIGH (ref 11.5–15.5)
WBC: 6.7 K/uL (ref 4.0–10.5)
nRBC: 0 % (ref 0.0–0.2)

## 2024-08-14 LAB — I-STAT CHEM 8, ED
BUN: 46 mg/dL — ABNORMAL HIGH (ref 8–23)
Calcium, Ion: 1.12 mmol/L — ABNORMAL LOW (ref 1.15–1.40)
Chloride: 111 mmol/L (ref 98–111)
Creatinine, Ser: 2.2 mg/dL — ABNORMAL HIGH (ref 0.44–1.00)
Glucose, Bld: 174 mg/dL — ABNORMAL HIGH (ref 70–99)
HCT: 38 % (ref 36.0–46.0)
Hemoglobin: 12.9 g/dL (ref 12.0–15.0)
Potassium: 4.5 mmol/L (ref 3.5–5.1)
Sodium: 141 mmol/L (ref 135–145)
TCO2: 22 mmol/L (ref 22–32)

## 2024-08-14 LAB — COMPREHENSIVE METABOLIC PANEL WITH GFR
ALT: 14 U/L (ref 0–44)
AST: 16 U/L (ref 15–41)
Albumin: 2.7 g/dL — ABNORMAL LOW (ref 3.5–5.0)
Alkaline Phosphatase: 87 U/L (ref 38–126)
Anion gap: 13 (ref 5–15)
BUN: 32 mg/dL — ABNORMAL HIGH (ref 8–23)
CO2: 17 mmol/L — ABNORMAL LOW (ref 22–32)
Calcium: 8.7 mg/dL — ABNORMAL LOW (ref 8.9–10.3)
Chloride: 108 mmol/L (ref 98–111)
Creatinine, Ser: 2.03 mg/dL — ABNORMAL HIGH (ref 0.44–1.00)
GFR, Estimated: 24 mL/min — ABNORMAL LOW (ref >60)
Glucose, Bld: 175 mg/dL — ABNORMAL HIGH (ref 70–99)
Potassium: 3.8 mmol/L (ref 3.5–5.1)
Sodium: 138 mmol/L (ref 135–145)
Total Bilirubin: 0.9 mg/dL (ref 0.0–1.2)
Total Protein: 6.7 g/dL (ref 6.5–8.1)

## 2024-08-14 LAB — RESP PANEL BY RT-PCR (RSV, FLU A&B, COVID)  RVPGX2
Influenza A by PCR: NEGATIVE
Influenza B by PCR: NEGATIVE
Resp Syncytial Virus by PCR: NEGATIVE
SARS Coronavirus 2 by RT PCR: NEGATIVE

## 2024-08-14 LAB — I-STAT CG4 LACTIC ACID, ED: Lactic Acid, Venous: 1.4 mmol/L (ref 0.5–1.9)

## 2024-08-14 MED ORDER — HYDRALAZINE HCL 25 MG PO TABS
100.0000 mg | ORAL_TABLET | Freq: Once | ORAL | Status: AC
  Administered 2024-08-14: 100 mg via ORAL
  Filled 2024-08-14: qty 4

## 2024-08-14 MED ORDER — FLUORESCEIN SODIUM 1 MG OP STRP
1.0000 | ORAL_STRIP | Freq: Once | OPHTHALMIC | Status: AC
  Administered 2024-08-14: 1 via OPHTHALMIC
  Filled 2024-08-14: qty 1

## 2024-08-14 MED ORDER — SODIUM CHLORIDE 0.9 % IV SOLN
2.0000 g | Freq: Once | INTRAVENOUS | Status: AC
  Administered 2024-08-14: 2 g via INTRAVENOUS
  Filled 2024-08-14: qty 12.5

## 2024-08-14 MED ORDER — GUAIFENESIN-CODEINE 100-10 MG/5ML PO SOLN
5.0000 mL | Freq: Once | ORAL | Status: AC
  Administered 2024-08-14: 5 mL via ORAL
  Filled 2024-08-14: qty 5

## 2024-08-14 MED ORDER — GADOBUTROL 1 MMOL/ML IV SOLN
10.0000 mL | Freq: Once | INTRAVENOUS | Status: AC | PRN
  Administered 2024-08-14: 10 mL via INTRAVENOUS

## 2024-08-14 MED ORDER — VANCOMYCIN HCL 2000 MG/400ML IV SOLN
2000.0000 mg | Freq: Once | INTRAVENOUS | Status: AC
  Administered 2024-08-14: 2000 mg via INTRAVENOUS
  Filled 2024-08-14: qty 400

## 2024-08-14 NOTE — ED Notes (Signed)
 Dr.Paterson made aware of pt elevated bp

## 2024-08-14 NOTE — ED Triage Notes (Signed)
 Pt bib ems from [facility] with c/o intermittent facial droop, left sided blurried vision, and cough. Pt stated that she has had an ongoing ear infection for approx 3 weeks. Pt stated she has still been experiencing pain. Denies headache and weakness.

## 2024-08-14 NOTE — Progress Notes (Addendum)
 ED Pharmacy Antibiotic Sign Off An antibiotic consult was received from an ED provider for vancomycin  and cefepime  per pharmacy dosing for wound infection. A chart review was completed to assess appropriateness.   The following one time order(s) were placed:  Cefepime  2G IV x1 Vancomycin  2G IV x1  Further antibiotic and/or antibiotic pharmacy consults should be ordered by the admitting provider if indicated.   Thank you for allowing pharmacy to be a part of this patient's care.   Koren LITTIE Or, [facility]  Clinical Pharmacist 08/14/24 8:56 PM

## 2024-08-14 NOTE — ED Provider Notes (Signed)
 [place] EMERGENCY DEPARTMENT AT [facility] Provider Note   CSN: [id number] Arrival date & time: 08/14/24  2018     Patient presents with: Facial Droop   Beth [name] is a 78 y.o. female.  {Add pertinent medical, surgical, social history, OB history to HPI:6263} 78 year old female with a history of osteomyelitis, septic arthritis, stroke, CKD, and CHF who presents to the emergency department with left ear pain and facial paralysis.  Patient has been treated for otitis externa of the left ear over the past 4 weeks.  Over the past week also noticed that she was having a difficult time moving her left face.  Dauther says it looks much weaker today. Has noticed some vision changes out of her left eye.  Also is having some headaches on the left side.  No fevers or chills.  Has been treated topically with antibiotics in her ear.  No difficulty speaking.  No weakness or numbness of her arms or legs       Prior to Admission medications   Medication Sig Start Date End Date Taking? Authorizing Provider  acetaminophen  (TYLENOL ) 500 MG tablet Take 2 tablets (1,000 mg total) by mouth every 6 (six) hours as needed for mild pain. 09/28/18   Zollie Lowers, MD  alendronate  (FOSAMAX ) 70 MG tablet TAKE 1 TABLET WEEKLY (TAKE WITH 8OZ OF WATER  30 MINUTES BEFORE BREAKFAST) Patient not taking: Reported on 08/01/2024 05/16/24   Zollie Lowers, MD  apixaban  (ELIQUIS ) 5 MG TABS tablet Take 2 tablets (10 mg total) by mouth 2 (two) times daily for 7 days, THEN 1 tablet (5 mg total) 2 (two) times daily. 08/07/24 09/13/24  Darci Pore, MD  aspirin  EC 81 MG tablet Take 1 tablet (81 mg total) by mouth daily. Swallow whole. 08/08/24   Darci Pore, MD  atorvastatin  (LIPITOR ) 20 MG tablet Take 2 tablets (40 mg total) by mouth every evening. 08/07/24   Darci Pore, MD  azelastine  (ASTELIN ) 0.1 % nasal spray Place 1 spray into both nostrils 2 (two) times daily. Use in each nostril as  directed 07/26/24   Deitra Morton Sebastian Nena, NP  ciprofloxacin -dexamethasone  (CIPRODEX ) OTIC suspension Place 4 drops into the left ear 2 (two) times daily. 08/07/24   Darci Pore, MD  colchicine  0.6 MG tablet Take twice daily for gout attack. (may take every two hours up to 6 doses at acute onset) 03/03/24   Zollie Lowers, MD  diltiazem  (CARDIZEM  CD) 240 MG 24 hr capsule TAKE ONE CAPSULE BY MOUTH DAILY 05/16/24   Zollie Lowers, MD  empagliflozin  (JARDIANCE ) 10 MG TABS tablet Take 1 tablet (10 mg total) by mouth daily. Patient not taking: Reported on 07/25/2024 09/29/23   Zollie Lowers, MD  ferrous sulfate  325 (65 FE) MG tablet Take 325 mg by mouth 2 (two) times a week. Patient not taking: Reported on 08/01/2024    [provider]  furosemide  (LASIX ) 20 MG tablet Take 20 mg by mouth daily. 02/12/24   [provider]  hydrALAZINE  (APRESOLINE ) 25 MG tablet TAKE ONE (1) TABLET BY MOUTH 3 TIMES DAILY Patient taking differently: Take 100 mg by mouth 3 (three) times daily. 07/18/24   Zollie Lowers, MD  isosorbide  mononitrate (IMDUR ) 60 MG 24 hr tablet Take 1 tablet (60 mg total) by mouth daily. 08/08/24   Darci Pore, MD  NEOMYCIN -POLYMYXIN-HYDROCORTISONE  (CORTISPORIN) 1 % SOLN OTIC solution Place 3 drops into the left ear every 4 (four) hours.    [provider]  ondansetron  (ZOFRAN ) 4 MG  tablet Take 1 tablet (4 mg total) by mouth every 8 (eight) hours as needed for nausea or vomiting. 07/25/24   [facility] Sebastian Pool, NP    Allergies: Cefdinir, Aleve [naproxen sodium], Asa [aspirin ], Ciprofloxacin , Clonidine  derivatives, and Shellfish allergy    Review of Systems  Updated Vital Signs BP (!) 192/93   Pulse 67   Temp (!) 97.4 F (36.3 C)   Resp 20   Ht 5' 5 (1.651 m)   Wt 100.4 kg   SpO2 99%   BMI 36.83 kg/m   Physical Exam Vitals and nursing note reviewed.  Constitutional:      General: She is not in acute distress.    Appearance: She is  well-developed.  HENT:     Head: Atraumatic.     Comments: Left-sided facial paralysis that involves the forehead    Right Ear: External ear normal.     Ears:     Comments: Left external ear with granulation tissue that is seen in the ear canal as well.  TM appears inflamed and opacified on the left side.  No mastoid tenderness to palpation on the left.    Nose: Nose normal.  Eyes:     Extraocular Movements: Extraocular movements intact.     Right eye: Normal extraocular motion and no nystagmus.     Left eye: Normal extraocular motion and no nystagmus.     Conjunctiva/sclera: Conjunctivae normal.     Left eye: Left conjunctiva is not injected. Chemosis present.     Pupils: Pupils are equal, round, and reactive to light.     Comments: Fluorescein  without any focal uptake to the left cornea.  No dendrites or pseudo dendrites noted.  Cardiovascular:     Rate and Rhythm: Normal rate and regular rhythm.     Heart sounds: No murmur heard. Pulmonary:     Effort: Pulmonary effort is normal. No respiratory distress.     Breath sounds: Normal breath sounds.  Musculoskeletal:     Cervical back: Normal range of motion and neck supple.     Right lower leg: Edema present.     Left lower leg: Edema present.  Skin:    General: Skin is warm and dry.  Neurological:     Mental Status: She is alert.     Comments: MENTAL STATUS: AAOx3 CRANIAL NERVES: II: Pupils equal and reactive 3 mm BL, no RAPD, no VF deficits. III, IV, VI: EOM intact, no gaze preference or deviation, no nystagmus. V: normal sensation to light touch in V1, V2, and V3 segments bilaterally VII: Left-sided facial weakness involves the forehead VIII: Diminished hearing out of left ear IX, X: normal palatal elevation, no uvular deviation XI: 5/5 head turn and 5/5 shoulder shrug bilaterally XII: midline tongue protrusion MOTOR: 5/5 strength in bilateral upper extremities.  5/5 strength in left lower extremity.  2/5 strength in right  lower extremity which is at baseline since her knee was replaced SENSORY: Normal sensation to light touch in all extremities  Psychiatric:        Mood and Affect: Mood normal.     (all labs ordered are listed, but only abnormal results are displayed) Labs Reviewed  CBC WITH DIFFERENTIAL/PLATELET - Abnormal; Notable for the following components:      Result Value   Hemoglobin 11.8 (*)    RDW 15.9 (*)    All other components within normal limits  I-STAT CHEM 8, ED - Abnormal; Notable for the following components:   BUN 46 (*)  Creatinine, Ser 2.20 (*)    Glucose, Bld 174 (*)    Calcium , Ion 1.12 (*)    All other components within normal limits  CULTURE, BLOOD (ROUTINE X 2)  CULTURE, BLOOD (ROUTINE X 2)  RESP PANEL BY RT-PCR (RSV, FLU A&B, COVID)  RVPGX2  COMPREHENSIVE METABOLIC PANEL WITH GFR  I-STAT CG4 LACTIC ACID, ED    EKG: None  Radiology: DG Chest Port 1 View Result Date: 08/14/2024 CLINICAL DATA:  Sepsis, cough EXAM: PORTABLE CHEST 1 VIEW COMPARISON:  08/06/2024 FINDINGS: Single frontal view of the chest demonstrates stable enlargement of the cardiac silhouette. No acute airspace disease, effusion, or pneumothorax. No acute bony abnormalities. IMPRESSION: 1. Stable enlarged cardiac silhouette.  No acute airspace disease. Electronically Signed   By: Ozell Daring M.D.   On: 08/14/2024 21:13    {Document cardiac monitor, telemetry assessment procedure when appropriate:32947} Procedures   Medications Ordered in the ED  ceFEPIme  (MAXIPIME ) 2 g in sodium chloride  0.9 % 100 mL IVPB (has no administration in time range)  vancomycin  (VANCOREADY) IVPB 2000 mg/400 mL (has no administration in time range)    Clinical Course as of 08/14/24 2341  Sun Aug 14, 2024  2249 Dw Dr Roark from ENT consulted.  Recommends admission with IV antibiotics and they will see the patient tomorrow. [RP]    Clinical Course User Index [RP] Yolande Lamar BROCKS, MD   {Click here for ABCD2, HEART  and other calculators REFRESH Note before signing:1}                              Medical Decision Making Amount and/or Complexity of Data Reviewed Labs: ordered. Radiology: ordered.  Risk OTC drugs. Prescription drug management. Decision regarding hospitalization.   ***  {Document critical care time when appropriate  Document review of labs and clinical decision tools ie CHADS2VASC2, etc  Document your independent review of radiology images and any outside records  Document your discussion with family members, caretakers and with consultants  Document social determinants of health affecting pt's care  Document your decision making why or why not admission, treatments were needed:32947:::1}   Final diagnoses:  None    ED Discharge Orders     None

## 2024-08-15 ENCOUNTER — Inpatient Hospital Stay (HOSPITAL_COMMUNITY)

## 2024-08-15 ENCOUNTER — Encounter (HOSPITAL_COMMUNITY)
Admission: EM | Disposition: A | Discharge status: Skilled Nursing Facility | Payer: Self-pay | Source: Skilled Nursing Facility | Attending: Internal Medicine

## 2024-08-15 ENCOUNTER — Inpatient Hospital Stay (HOSPITAL_COMMUNITY): Admitting: Anesthesiology

## 2024-08-15 ENCOUNTER — Encounter (HOSPITAL_COMMUNITY): Payer: Self-pay | Admitting: Internal Medicine

## 2024-08-15 DIAGNOSIS — R2689 Other abnormalities of gait and mobility: Secondary | ICD-10-CM | POA: Diagnosis not present

## 2024-08-15 DIAGNOSIS — I2510 Atherosclerotic heart disease of native coronary artery without angina pectoris: Secondary | ICD-10-CM | POA: Diagnosis not present

## 2024-08-15 DIAGNOSIS — M6281 Muscle weakness (generalized): Secondary | ICD-10-CM | POA: Diagnosis not present

## 2024-08-15 DIAGNOSIS — H6692 Otitis media, unspecified, left ear: Secondary | ICD-10-CM | POA: Diagnosis not present

## 2024-08-15 DIAGNOSIS — I160 Hypertensive urgency: Secondary | ICD-10-CM | POA: Diagnosis not present

## 2024-08-15 DIAGNOSIS — I509 Heart failure, unspecified: Secondary | ICD-10-CM | POA: Diagnosis not present

## 2024-08-15 DIAGNOSIS — M109 Gout, unspecified: Secondary | ICD-10-CM | POA: Diagnosis not present

## 2024-08-15 DIAGNOSIS — I5033 Acute on chronic diastolic (congestive) heart failure: Secondary | ICD-10-CM

## 2024-08-15 DIAGNOSIS — N184 Chronic kidney disease, stage 4 (severe): Secondary | ICD-10-CM

## 2024-08-15 DIAGNOSIS — H672 Otitis media in diseases classified elsewhere, left ear: Secondary | ICD-10-CM | POA: Diagnosis not present

## 2024-08-15 DIAGNOSIS — E785 Hyperlipidemia, unspecified: Secondary | ICD-10-CM | POA: Diagnosis not present

## 2024-08-15 DIAGNOSIS — I130 Hypertensive heart and chronic kidney disease with heart failure and stage 1 through stage 4 chronic kidney disease, or unspecified chronic kidney disease: Secondary | ICD-10-CM | POA: Diagnosis not present

## 2024-08-15 DIAGNOSIS — Z86711 Personal history of pulmonary embolism: Secondary | ICD-10-CM | POA: Diagnosis present

## 2024-08-15 DIAGNOSIS — E1122 Type 2 diabetes mellitus with diabetic chronic kidney disease: Secondary | ICD-10-CM | POA: Diagnosis not present

## 2024-08-15 DIAGNOSIS — I5032 Chronic diastolic (congestive) heart failure: Secondary | ICD-10-CM | POA: Diagnosis not present

## 2024-08-15 DIAGNOSIS — R41841 Cognitive communication deficit: Secondary | ICD-10-CM | POA: Diagnosis not present

## 2024-08-15 DIAGNOSIS — R29810 Facial weakness: Secondary | ICD-10-CM | POA: Diagnosis not present

## 2024-08-15 DIAGNOSIS — Z881 Allergy status to other antibiotic agents status: Secondary | ICD-10-CM | POA: Diagnosis not present

## 2024-08-15 DIAGNOSIS — Z886 Allergy status to analgesic agent status: Secondary | ICD-10-CM | POA: Diagnosis not present

## 2024-08-15 DIAGNOSIS — Z8673 Personal history of transient ischemic attack (TIA), and cerebral infarction without residual deficits: Secondary | ICD-10-CM | POA: Diagnosis not present

## 2024-08-15 DIAGNOSIS — H60502 Unspecified acute noninfective otitis externa, left ear: Secondary | ICD-10-CM | POA: Diagnosis present

## 2024-08-15 DIAGNOSIS — Z91013 Allergy to seafood: Secondary | ICD-10-CM | POA: Diagnosis not present

## 2024-08-15 DIAGNOSIS — Z7401 Bed confinement status: Secondary | ICD-10-CM | POA: Diagnosis not present

## 2024-08-15 DIAGNOSIS — Z7984 Long term (current) use of oral hypoglycemic drugs: Secondary | ICD-10-CM | POA: Diagnosis not present

## 2024-08-15 DIAGNOSIS — Z7983 Long term (current) use of bisphosphonates: Secondary | ICD-10-CM | POA: Diagnosis not present

## 2024-08-15 DIAGNOSIS — G510 Bell's palsy: Principal | ICD-10-CM

## 2024-08-15 DIAGNOSIS — Z96651 Presence of right artificial knee joint: Secondary | ICD-10-CM | POA: Diagnosis not present

## 2024-08-15 DIAGNOSIS — Z8249 Family history of ischemic heart disease and other diseases of the circulatory system: Secondary | ICD-10-CM | POA: Diagnosis not present

## 2024-08-15 DIAGNOSIS — I5030 Unspecified diastolic (congestive) heart failure: Secondary | ICD-10-CM | POA: Diagnosis not present

## 2024-08-15 DIAGNOSIS — B028 Zoster with other complications: Secondary | ICD-10-CM | POA: Diagnosis not present

## 2024-08-15 DIAGNOSIS — R5381 Other malaise: Secondary | ICD-10-CM | POA: Diagnosis not present

## 2024-08-15 DIAGNOSIS — Z1152 Encounter for screening for COVID-19: Secondary | ICD-10-CM | POA: Diagnosis not present

## 2024-08-15 DIAGNOSIS — Z7901 Long term (current) use of anticoagulants: Secondary | ICD-10-CM | POA: Diagnosis not present

## 2024-08-15 DIAGNOSIS — R1311 Dysphagia, oral phase: Secondary | ICD-10-CM | POA: Diagnosis not present

## 2024-08-15 DIAGNOSIS — Z833 Family history of diabetes mellitus: Secondary | ICD-10-CM | POA: Diagnosis not present

## 2024-08-15 DIAGNOSIS — H538 Other visual disturbances: Secondary | ICD-10-CM | POA: Diagnosis not present

## 2024-08-15 DIAGNOSIS — Z888 Allergy status to other drugs, medicaments and biological substances status: Secondary | ICD-10-CM | POA: Diagnosis not present

## 2024-08-15 HISTORY — PX: MYRINGOTOMY WITH TUBE PLACEMENT: SHX5663

## 2024-08-15 LAB — GLUCOSE, CAPILLARY
Glucose-Capillary: 108 mg/dL — ABNORMAL HIGH (ref 70–99)
Glucose-Capillary: 119 mg/dL — ABNORMAL HIGH (ref 70–99)

## 2024-08-15 LAB — MRSA NEXT GEN BY PCR, NASAL: MRSA by PCR Next Gen: NOT DETECTED

## 2024-08-15 LAB — C-REACTIVE PROTEIN: CRP: 0.8 mg/dL (ref <1.0)

## 2024-08-15 SURGERY — MYRINGOTOMY WITH TUBE PLACEMENT
Anesthesia: General | Site: Ear | Laterality: Left

## 2024-08-15 MED ORDER — PROPOFOL 10 MG/ML IV BOLUS
INTRAVENOUS | Status: DC | PRN
  Administered 2024-08-15: 100 mg via INTRAVENOUS

## 2024-08-15 MED ORDER — CHLORHEXIDINE GLUCONATE 0.12 % MT SOLN: 15.0000 mL | Freq: Once | OROMUCOSAL | Status: AC

## 2024-08-15 MED ORDER — GUAIFENESIN 100 MG/5ML PO LIQD: 200.0000 mg | ORAL | Status: DC | PRN

## 2024-08-15 MED ORDER — CHLORHEXIDINE GLUCONATE 0.12 % MT SOLN
OROMUCOSAL | Status: AC
  Administered 2024-08-15: 15 mL via OROMUCOSAL
  Filled 2024-08-15: qty 15

## 2024-08-15 MED ORDER — EMPAGLIFLOZIN 10 MG PO TABS
10.0000 mg | ORAL_TABLET | Freq: Every day | ORAL | Status: DC
  Administered 2024-08-15 – 2024-08-19 (×5): 10 mg via ORAL
  Filled 2024-08-15 (×5): qty 1

## 2024-08-15 MED ORDER — DILTIAZEM HCL ER COATED BEADS 120 MG PO CP24
240.0000 mg | ORAL_CAPSULE | Freq: Every day | ORAL | Status: DC
  Administered 2024-08-15 – 2024-08-19 (×5): 240 mg via ORAL
  Filled 2024-08-15: qty 2
  Filled 2024-08-15: qty 1
  Filled 2024-08-15 (×4): qty 2

## 2024-08-15 MED ORDER — SODIUM CHLORIDE 0.9 % IV SOLN
3.0000 g | Freq: Two times a day (BID) | INTRAVENOUS | Status: DC
  Administered 2024-08-15 – 2024-08-18 (×8): 3 g via INTRAVENOUS
  Filled 2024-08-15 (×8): qty 8

## 2024-08-15 MED ORDER — ACETAMINOPHEN 650 MG RE SUPP: 650.0000 mg | Freq: Four times a day (QID) | RECTAL | Status: DC | PRN

## 2024-08-15 MED ORDER — PHENYLEPHRINE 80 MCG/ML (10ML) SYRINGE FOR IV PUSH (FOR BLOOD PRESSURE SUPPORT)
PREFILLED_SYRINGE | INTRAVENOUS | Status: DC | PRN
  Administered 2024-08-15: 160 ug via INTRAVENOUS
  Administered 2024-08-15: 120 ug via INTRAVENOUS

## 2024-08-15 MED ORDER — BENZONATATE 100 MG PO CAPS
200.0000 mg | ORAL_CAPSULE | Freq: Three times a day (TID) | ORAL | Status: DC | PRN
  Administered 2024-08-16 – 2024-08-18 (×3): 200 mg via ORAL
  Filled 2024-08-15 (×3): qty 2

## 2024-08-15 MED ORDER — LIDOCAINE 2% (20 MG/ML) 5 ML SYRINGE
INTRAMUSCULAR | Status: AC
  Filled 2024-08-15: qty 5

## 2024-08-15 MED ORDER — FENTANYL CITRATE PF 50 MCG/ML IJ SOSY
25.0000 ug | PREFILLED_SYRINGE | Freq: Once | INTRAMUSCULAR | Status: AC
  Administered 2024-08-15: 25 ug via INTRAVENOUS
  Filled 2024-08-15: qty 1

## 2024-08-15 MED ORDER — ACETAMINOPHEN 325 MG PO TABS: 650.0000 mg | ORAL_TABLET | Freq: Four times a day (QID) | ORAL | Status: DC | PRN

## 2024-08-15 MED ORDER — OXYCODONE HCL 5 MG/5ML PO SOLN: 5.0000 mg | Freq: Once | ORAL | Status: DC | PRN

## 2024-08-15 MED ORDER — ISOSORBIDE MONONITRATE ER 60 MG PO TB24
60.0000 mg | ORAL_TABLET | Freq: Every day | ORAL | Status: DC
  Administered 2024-08-15 – 2024-08-19 (×5): 60 mg via ORAL
  Filled 2024-08-15 (×3): qty 1
  Filled 2024-08-15: qty 2
  Filled 2024-08-15: qty 1

## 2024-08-15 MED ORDER — VANCOMYCIN HCL IN DEXTROSE 1-5 GM/200ML-% IV SOLN: 1000.0000 mg | INTRAVENOUS | Status: DC

## 2024-08-15 MED ORDER — ONDANSETRON HCL 4 MG/2ML IJ SOLN
INTRAMUSCULAR | Status: DC | PRN
  Administered 2024-08-15: 4 mg via INTRAVENOUS

## 2024-08-15 MED ORDER — FENTANYL CITRATE PF 50 MCG/ML IJ SOSY
25.0000 ug | PREFILLED_SYRINGE | INTRAMUSCULAR | Status: DC | PRN
  Administered 2024-08-15 – 2024-08-17 (×3): 25 ug via INTRAVENOUS
  Filled 2024-08-15 (×3): qty 1

## 2024-08-15 MED ORDER — SODIUM CHLORIDE 0.9 % IV SOLN: 2.0000 g | INTRAVENOUS | Status: DC

## 2024-08-15 MED ORDER — ORAL CARE MOUTH RINSE: 15.0000 mL | Freq: Once | OROMUCOSAL | Status: AC

## 2024-08-15 MED ORDER — CIPROFLOXACIN-DEXAMETHASONE 0.3-0.1 % OT SUSP
OTIC | Status: AC
  Filled 2024-08-15: qty 7.5

## 2024-08-15 MED ORDER — DEXAMETHASONE SODIUM PHOSPHATE 10 MG/ML IJ SOLN
INTRAMUSCULAR | Status: AC
  Filled 2024-08-15: qty 1

## 2024-08-15 MED ORDER — OXYCODONE HCL 5 MG PO TABS: 5.0000 mg | ORAL_TABLET | Freq: Once | ORAL | Status: DC | PRN

## 2024-08-15 MED ORDER — ATORVASTATIN CALCIUM 40 MG PO TABS
40.0000 mg | ORAL_TABLET | Freq: Every day | ORAL | Status: DC
  Administered 2024-08-15 – 2024-08-18 (×4): 40 mg via ORAL
  Filled 2024-08-15 (×4): qty 1

## 2024-08-15 MED ORDER — ONDANSETRON HCL 4 MG/2ML IJ SOLN
INTRAMUSCULAR | Status: AC
  Filled 2024-08-15: qty 4

## 2024-08-15 MED ORDER — ALBUTEROL SULFATE (2.5 MG/3ML) 0.083% IN NEBU: 2.5000 mg | INHALATION_SOLUTION | Freq: Four times a day (QID) | RESPIRATORY_TRACT | Status: DC | PRN

## 2024-08-15 MED ORDER — ROCURONIUM BROMIDE 10 MG/ML (PF) SYRINGE
PREFILLED_SYRINGE | INTRAVENOUS | Status: AC
  Filled 2024-08-15: qty 10

## 2024-08-15 MED ORDER — LIDOCAINE 2% (20 MG/ML) 5 ML SYRINGE
INTRAMUSCULAR | Status: DC | PRN
  Administered 2024-08-15: 100 mg via INTRAVENOUS

## 2024-08-15 MED ORDER — FENTANYL CITRATE (PF) 100 MCG/2ML IJ SOLN: 25.0000 ug | INTRAMUSCULAR | Status: DC | PRN

## 2024-08-15 MED ORDER — PHENYLEPHRINE 80 MCG/ML (10ML) SYRINGE FOR IV PUSH (FOR BLOOD PRESSURE SUPPORT)
PREFILLED_SYRINGE | INTRAVENOUS | Status: AC
  Filled 2024-08-15: qty 20

## 2024-08-15 MED ORDER — FENTANYL CITRATE (PF) 250 MCG/5ML IJ SOLN
INTRAMUSCULAR | Status: AC
  Filled 2024-08-15: qty 5

## 2024-08-15 MED ORDER — SODIUM CHLORIDE 0.9 % IV SOLN: INTRAVENOUS | Status: DC

## 2024-08-15 MED ORDER — ASPIRIN 81 MG PO TBEC
81.0000 mg | DELAYED_RELEASE_TABLET | Freq: Every day | ORAL | Status: DC
  Administered 2024-08-15 – 2024-08-19 (×5): 81 mg via ORAL
  Filled 2024-08-15 (×5): qty 1

## 2024-08-15 MED ORDER — HYDROCODONE-ACETAMINOPHEN 5-325 MG PO TABS
1.0000 | ORAL_TABLET | Freq: Four times a day (QID) | ORAL | Status: DC | PRN
  Administered 2024-08-15 – 2024-08-17 (×5): 1 via ORAL
  Filled 2024-08-15 (×5): qty 1

## 2024-08-15 MED ORDER — FENTANYL CITRATE (PF) 250 MCG/5ML IJ SOLN
INTRAMUSCULAR | Status: DC | PRN
  Administered 2024-08-15: 50 ug via INTRAVENOUS

## 2024-08-15 MED ORDER — METOPROLOL TARTRATE 5 MG/5ML IV SOLN
INTRAVENOUS | Status: DC | PRN
  Administered 2024-08-15: 5 mg via INTRAVENOUS

## 2024-08-15 MED ORDER — ACETAMINOPHEN 10 MG/ML IV SOLN: 1000.0000 mg | Freq: Once | INTRAVENOUS | Status: DC | PRN

## 2024-08-15 MED ORDER — APIXABAN 5 MG PO TABS
5.0000 mg | ORAL_TABLET | Freq: Two times a day (BID) | ORAL | Status: DC
  Administered 2024-08-15 – 2024-08-19 (×9): 5 mg via ORAL
  Filled 2024-08-15 (×9): qty 1

## 2024-08-15 MED ORDER — INSULIN ASPART 100 UNIT/ML IJ SOLN: 0.0000 [IU] | INTRAMUSCULAR | Status: DC | PRN

## 2024-08-15 MED ORDER — HYDRALAZINE HCL 50 MG PO TABS
100.0000 mg | ORAL_TABLET | Freq: Three times a day (TID) | ORAL | Status: DC
  Administered 2024-08-15 – 2024-08-19 (×13): 100 mg via ORAL
  Filled 2024-08-15 (×2): qty 2
  Filled 2024-08-15: qty 4
  Filled 2024-08-15 (×10): qty 2

## 2024-08-15 MED ORDER — HYDRALAZINE HCL 20 MG/ML IJ SOLN: 10.0000 mg | INTRAMUSCULAR | Status: DC | PRN

## 2024-08-15 MED ORDER — METOPROLOL TARTRATE 5 MG/5ML IV SOLN
INTRAVENOUS | Status: AC
  Filled 2024-08-15: qty 5

## 2024-08-15 SURGICAL SUPPLY — 16 items
BAG COUNTER SPONGE SURGICOUNT (BAG) ×2 IMPLANT
BLADE MYRINGOTOMY 6 SPEAR HDL (BLADE) ×2 IMPLANT
CANISTER SUCTION 3000ML PPV (SUCTIONS) ×2 IMPLANT
COVER MAYO STAND STRL (DRAPES) ×2 IMPLANT
DRAPE HALF SHEET 40X57 (DRAPES) ×2 IMPLANT
GLOVE ECLIPSE 7.5 STRL STRAW (GLOVE) ×2 IMPLANT
KIT BASIN OR (CUSTOM PROCEDURE TRAY) ×2 IMPLANT
KIT TURNOVER KIT B (KITS) ×2 IMPLANT
NS IRRIG 1000ML POUR BTL (IV SOLUTION) ×2 IMPLANT
PAD ARMBOARD POSITIONER FOAM (MISCELLANEOUS) ×2 IMPLANT
POSITIONER HEAD DONUT 9IN (MISCELLANEOUS) ×2 IMPLANT
TOWEL GREEN STERILE FF (TOWEL DISPOSABLE) ×2 IMPLANT
TRAP SPECIMEN MUCUS 40CC (MISCELLANEOUS) IMPLANT
TUBE CONNECTING 12X1/4 (SUCTIONS) ×2 IMPLANT
TUBE EAR SHEEHY BUTTON 1.27 (OTOLOGIC RELATED) ×4 IMPLANT
TUBING EXTENTION W/L.L. (IV SETS) ×2 IMPLANT

## 2024-08-15 NOTE — Consult Note (Addendum)
 Reason for Consult:left ear infection Referring Physician: dr [name] is an 78 y.o. female.  HPI: hx of 4 weeks of the left ear. She has not had issues prior. She has decreased hearing on the left. 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 She has continued to left ear pain throughout. She had N/V few weeks ago. She has had left facial nerve weakness strating on Saturday. She has not had drainage. She has been on 3 antibiotics outpatient and ciprodex . She had MRI that shows left mastoid effusion.   Past Medical History:  Diagnosis Date   Anemia    Arthritis    Knee both knees   Blood transfusion without reported diagnosis 2012   anemia;pt denies transfusion stated was only on iron  tablet   Cataract    left   CKD (chronic kidney disease), stage III (HCC)    Diabetes mellitus without complication (HCC)    Family history of anesthesia complication    sister very slow to awaken after anesthesia;severe vomiting    Gout    left elbow   Herpes infection 08/09/2014   Saw doctor Wed. 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   Other acute osteomyelitis, right femur (HCC) 07/23/2020   Pseudomonas aeruginosa infection 12/15/2018   Pseudomonas aeruginosa infection 12/15/2018   Septic arthritis of knee, right (HCC) 08/27/2020   Stroke (HCC) 2006   x 1 no deficits noted     Past Surgical History:  Procedure Laterality Date   ABDOMINAL HYSTERECTOMY  1983   CARPAL TUNNEL RELEASE Right 1983   colonscopy  June 21, 2014   EXCISIONAL TOTAL KNEE ARTHROPLASTY WITH ANTIBIOTIC SPACERS Right 02/16/2015   Procedure: RIGHT KNEE RESECTION ARTHROPLASTY WITH ANTIBIOTIC SPACERS;  Surgeon: Dempsey Moan, MD;  Location: [facility];  Service: Orthopedics;  Laterality: Right;   EXCISIONAL  TOTAL KNEE ARTHROPLASTY WITH ANTIBIOTIC SPACERS Right 11/03/2018   Procedure: Right knee resection arthroplasty; antibiotic spacer;  Surgeon: Moan Dempsey, MD;  Location: [facility];  Service: Orthopedics;  Laterality: Right;  Adductor Block   I & D KNEE WITH POLY EXCHANGE Right 10/02/2014   Procedure: IRRIGATION AND DEBRIDEMENT RIGHT KNEE WITH POLY EXCHANGE;  Surgeon: Dempsey Moan GAILS, MD;  Location: [facility];  Service: Orthopedics;  Laterality: Right;   I & D KNEE WITH POLY EXCHANGE Right 08/27/2020   Procedure: IRRIGATION AND DEBRIDEMENT; SPACER EXCHANGE RIGHT KNEE with multiple specimens;  Surgeon: Moan Dempsey, MD;  Location: [facility];  Service: Orthopedics;  Laterality: Right;    INCISION AND DRAINAGE OF WOUND Right 01/14/2017   Procedure: IRRIGATION AND DEBRIDEMENT WOUND;  Surgeon: Dempsey Moan, MD;  Location: [facility];  Service: Orthopedics;  Laterality: Right;  requests   JOINT REPLACEMENT  06/2014   right knee   nasal cauterization  2012   PATELLAR TENDON REPAIR Right 08/11/2014   Procedure: RIGHT PATELLA TENDON REPAIR;  Surgeon: Dempsey Moan GAILS, MD;  Location: [facility];  Service: Orthopedics;  Laterality: Right;   REIMPLANTATION OF TOTAL KNEE Right 05/23/2015   Procedure: RIGHT KNEE ARTHROPLASTY REIMPLANTATION;  Surgeon: Dempsey Moan, MD;  Location: [facility];  Service: Orthopedics;  Laterality: Right;   TOTAL KNEE ARTHROPLASTY Right 07/03/2014   Procedure: RIGHT TOTAL KNEE ARTHROPLASTY;  Surgeon:  Dempsey Melodi GAILS, MD;  Location: [facility];  Service: Orthopedics;  Laterality: Right;   TUBAL LIGATION      Family History  Problem Relation Age of Onset   Ovarian cancer Mother    Cancer Mother    Peripheral vascular disease Father        with amputation of both legs   Hypertension Father    Heart disease Brother 53   Kidney disease Daughter    Heart disease Daughter 26   Diabetes Son    Diabetes Son    Colon cancer Neg Hx    Esophageal cancer Neg Hx    Stomach cancer Neg Hx     Rectal cancer Neg Hx     Social History:  reports that she has never smoked. She has never used smokeless tobacco. She reports that she does not drink alcohol and does not use drugs.  Allergies:  Allergies  Allergen Reactions   Cefdinir Swelling and Rash    Tolerated cephalosporins many times in the past   Aleve [Naproxen Sodium] Other (See Comments)    Heart races   Dorethia Guys ] Other (See Comments)    Nose bleeding   Ciprofloxacin  Other (See Comments)    Possible hamstring tendinopathy   Clonidine  Derivatives Other (See Comments)    Dizziness and weakness   Shellfish Allergy Nausea And Vomiting    Medications: I have reviewed the patient's current medications.  Results for orders placed or performed during the hospital encounter of 08/14/24 (from the past 48 hours)  Blood Culture (routine x 2)     Status: None (Preliminary result)   Collection Time: 08/14/24  8:43 PM   Specimen: BLOOD  Result Value Ref Range   Specimen Description BLOOD LEFT ANTECUBITAL    Special Requests      BOTTLES DRAWN AEROBIC AND ANAEROBIC Blood Culture adequate volume   Culture      NO GROWTH < 12 HOURS Performed at [facility], [address], [place], [place] [address]    Report Status PENDING   Resp panel by RT-PCR (RSV, Flu A&B, Covid) Anterior Nasal Swab     Status: None   Collection Time: 08/14/24  8:43 PM   Specimen: Anterior Nasal Swab  Result Value Ref Range   SARS Coronavirus 2 by RT PCR NEGATIVE NEGATIVE   Influenza A by PCR NEGATIVE NEGATIVE   Influenza B by PCR NEGATIVE NEGATIVE    Comment: (NOTE) The Xpert Xpress SARS-CoV-2/FLU/RSV plus assay is intended as an aid in the diagnosis of influenza from Nasopharyngeal swab specimens and should not be used as a sole basis for treatment. Nasal washings and aspirates are unacceptable for Xpert Xpress SARS-CoV-2/FLU/RSV testing.  Fact Sheet for Patients: BloggerCourse.com  Fact Sheet for Healthcare  Providers: SeriousBroker.it  This test is not yet approved or cleared by the [place]  FDA and has been authorized for detection and/or diagnosis of SARS-CoV-2 by FDA under an Emergency Use Authorization (EUA). This EUA will remain in effect (meaning this test can be used) for the duration of the COVID-19 declaration under Section 564(b)(1) of the Act, 21 U.S.C. section 360bbb-3(b)(1), unless the authorization is terminated or revoked.     Resp Syncytial Virus by PCR NEGATIVE NEGATIVE    Comment: (NOTE) Fact Sheet for Patients: BloggerCourse.com  Fact Sheet for Healthcare Providers: SeriousBroker.it  This test is not yet approved or cleared by the [place]  FDA and has been authorized for detection and/or diagnosis of SARS-CoV-2 by FDA under an  Emergency Use Authorization (EUA). This EUA will remain in effect (meaning this test can be used) for the duration of the COVID-19 declaration under Section 564(b)(1) of the Act, 21 U.S.C. section 360bbb-3(b)(1), unless the authorization is terminated or revoked.  Performed at [facility], [address] [place], [place] [address]   Comprehensive metabolic panel     Status: Abnormal   Collection Time: 08/14/24  9:00 PM  Result Value Ref Range   Sodium 138 135 - 145 mmol/L   Potassium 3.8 3.5 - 5.1 mmol/L   Chloride 108 98 - 111 mmol/L   CO2 17 (L) 22 - 32 mmol/L   Glucose, Bld 175 (H) 70 - 99 mg/dL    Comment: Glucose reference range applies only to samples taken after fasting for at least 8 hours.   BUN 32 (H) 8 - 23 mg/dL   Creatinine, Ser 7.96 (H) 0.44 - 1.00 mg/dL   Calcium  8.7 (L) 8.9 - 10.3 mg/dL   Total Protein 6.7 6.5 - 8.1 g/dL   Albumin  2.7 (L) 3.5 - 5.0 g/dL   AST 16 15 - 41 U/L   ALT 14 0 - 44 U/L   Alkaline Phosphatase 87 38 - 126 U/L   Total Bilirubin 0.9 0.0 - 1.2 mg/dL   GFR, Estimated 24 (L) >60 mL/min    Comment:  (NOTE) Calculated using the CKD-EPI Creatinine Equation (2021)    Anion gap 13 5 - 15    Comment: Performed at [facility], [address] [place], [place] [address]  CBC with Differential     Status: Abnormal   Collection Time: 08/14/24  9:00 PM  Result Value Ref Range   WBC 6.7 4.0 - 10.5 K/uL   RBC 4.45 3.87 - 5.11 MIL/uL   Hemoglobin 11.8 (L) 12.0 - 15.0 g/dL   HCT 61.3 63.9 - 53.9 %   MCV 86.7 80.0 - 100.0 fL   MCH 26.5 26.0 - 34.0 pg   MCHC 30.6 30.0 - 36.0 g/dL   RDW 84.0 (H) 88.4 - 84.4 %   Platelets 348 150 - 400 K/uL   nRBC 0.0 0.0 - 0.2 %   Neutrophils Relative % 66 %   Neutro Abs 4.4 1.7 - 7.7 K/uL   Lymphocytes Relative 20 %   Lymphs Abs 1.4 0.7 - 4.0 K/uL   Monocytes Relative 8 %   Monocytes Absolute 0.5 0.1 - 1.0 K/uL   Eosinophils Relative 5 %   Eosinophils Absolute 0.3 0.0 - 0.5 K/uL   Basophils Relative 1 %   Basophils Absolute 0.1 0.0 - 0.1 K/uL   Immature Granulocytes 0 %   Abs Immature Granulocytes 0.03 0.00 - 0.07 K/uL    Comment: Performed at [facility], [address], [place], [place] [address]  I-Stat Lactic Acid, ED     Status: None   Collection Time: 08/14/24  9:06 PM  Result Value Ref Range   Lactic Acid, Venous 1.4 0.5 - 1.9 mmol/L  I-stat chem 8, ED (not at [facility], [facility] or [facility])     Status: Abnormal   Collection Time: 08/14/24  9:06 PM  Result Value Ref Range   Sodium 141 135 - 145 mmol/L   Potassium 4.5 3.5 - 5.1 mmol/L   Chloride 111 98 - 111 mmol/L   BUN 46 (H) 8 - 23 mg/dL   Creatinine, Ser 7.79 (H) 0.44 - 1.00 mg/dL   Glucose, Bld 825 (H) 70 - 99 mg/dL  Comment: Glucose reference range applies only to samples taken after fasting for at least 8 hours.   Calcium , Ion 1.12 (L) 1.15 - 1.40 mmol/L   TCO2 22 22 - 32 mmol/L   Hemoglobin 12.9 12.0 - 15.0 g/dL   HCT 61.9 63.9 - 53.9 %  Blood Culture (routine x 2)     Status: None (Preliminary result)   Collection Time: 08/14/24 10:30 PM   Specimen: BLOOD  Result Value Ref  Range   Specimen Description BLOOD BLOOD LEFT HAND    Special Requests      BOTTLES DRAWN AEROBIC ONLY Blood Culture results may not be optimal due to an inadequate volume of blood received in culture bottles   Culture      NO GROWTH < 12 HOURS Performed at [facility], [address] [place], [place] [address]    Report Status PENDING     MR Brain W and Wo Contrast Result Date: 08/14/2024 CLINICAL DATA:  L facial droop EXAM: MRI HEAD WITHOUT AND WITH CONTRAST TECHNIQUE: Multiplanar, multiecho pulse sequences of the brain and surrounding structures were obtained without and with intravenous contrast. CONTRAST:  10mL GADAVIST  GADOBUTROL  1 MMOL/ML IV SOLN COMPARISON:  CT head 08/01/2024. FINDINGS: Brain: No acute infarction, acute hemorrhage, hydrocephalus, extra-axial collection or mass lesion. Numerous punctate foci of susceptibility artifact predominantly in the cerebellum, thalami and basal ganglia and to lesser extent scattered throughout the supratentorial brain, compatible with chronic microhemorrhages. Moderate T2/FLAIR hyperintensities the white matter are compatible with chronic microvascular ischemic disease. No abnormal enhancement. Vascular: Normal flow voids. Skull and upper cervical spine: Normal marrow signal. Sinuses/Orbits: Opacified right maxillary sinus. Other: Left mastoid effusion. IMPRESSION: 1. No evidence of acute intracranial abnormality. 2. Moderate chronic microvascular ischemic disease. 3. Numerous chronic microhemorrhages, likely due to chronic hypertension. 4. Left mastoid effusion. 5. Opacified right maxillary sinus. Electronically Signed   By: Gilmore GORMAN Molt M.D.   On: 08/14/2024 22:16   DG Chest Port 1 View Result Date: 08/14/2024 CLINICAL DATA:  Sepsis, cough EXAM: PORTABLE CHEST 1 VIEW COMPARISON:  08/06/2024 FINDINGS: Single frontal view of the chest demonstrates stable enlargement of the cardiac silhouette. No acute airspace disease, effusion, or  pneumothorax. No acute bony abnormalities. IMPRESSION: 1. Stable enlarged cardiac silhouette.  No acute airspace disease. Electronically Signed   By: Ozell Daring M.D.   On: 08/14/2024 21:13    ROS Blood pressure (!) 171/101, pulse 66, temperature 98.6 F (37 C), temperature source Oral, resp. rate (!) 23, height 5' 5 (1.651 m), weight 100.4 kg, SpO2 99%. Physical Exam HENT:     Head: Normocephalic.     Right Ear: Tympanic membrane, ear canal and external ear normal.     Ears:     Comments: Left ear had some cerumen blocking the canal tht was suctioned out. The TM is thickened and definite appearance of effusion. There is no granulation tissue and the canal is not significantly swollen. The left VII nerve is weak in all branches but not fully paralyzed. Eye is without dryness or irritation Neurological:     Mental Status: She is alert.       Assessment/Plan: Left OM/facial nerve paresis- she needs an immediate tube given the 4 weeks of ear pain, effusion, and new facial nerve involvement. We discussed LMT in OR. Risks, benefits and options. All questions answered and consent obtained. Scheduled for noon. If medically ok, she should be on 40 mg of prednisone  per day and Unasyn .   Beth  [name] 08/15/2024, 9:57 AM

## 2024-08-15 NOTE — Anesthesia Procedure Notes (Signed)
 Procedure Name: MAC Date/Time: 08/15/2024 12:08 PM  Performed by: Boyce Shilling, CRNAPre-anesthesia Checklist: Patient identified, Emergency Drugs available, Suction available, Timeout performed and Patient being monitored Patient Re-evaluated:Patient Re-evaluated prior to induction Oxygen Delivery Method: Circle system utilized Preoxygenation: Pre-oxygenation with 100% oxygen Induction Type: IV induction Ventilation: Mask ventilation without difficulty, Mask ventilation throughout procedure and Oral airway inserted - appropriate to patient size Dental Injury: Teeth and Oropharynx as per pre-operative assessment

## 2024-08-15 NOTE — Progress Notes (Signed)
 Patient arrives to 5W11 at this time from PACU

## 2024-08-15 NOTE — Transfer of Care (Signed)
 Immediate Anesthesia Transfer of Care Note  Patient: Beth [name]  Procedure(s) Performed: MYRINGOTOMY WITH LEFT EAR TUBE PLACEMENT (Left: Ear)  Patient Location: PACU  Anesthesia Type:General  Level of Consciousness: awake and drowsy  Airway & Oxygen Therapy: Patient Spontanous Breathing and Patient connected to face mask oxygen  Post-op Assessment: Report given to RN and Post -op Vital signs reviewed and stable  Post vital signs: Reviewed and stable  Last Vitals:  Vitals Value Taken Time  BP 133/77 08/15/24 12:22  Temp 36.9 C 08/15/24 12:22  Pulse 66 08/15/24 12:25  Resp 26 08/15/24 12:25  SpO2 99 % 08/15/24 12:25  Vitals shown include unfiled device data.  Last Pain:  Vitals:   08/15/24 1129  TempSrc:   PainSc: 7          Complications: No notable events documented.

## 2024-08-15 NOTE — Op Note (Signed)
 Preop/postop diagnosis: Left otitis media Procedure: Left myringotomy and tube with culture Anesthesia: Mask ventilation Estimated blood loss less than 5 cc Indications: 78 year old with a 1 week history of left ear pain and problems with hearing loss.  She had nausea and vomiting at 1 point during the course.  She has a MRI scan that shows a effusion in the mastoid and now with left facial paresis.  She was informed the risks and benefits of the procedure and options were discussed all questions were answered and consent was obtained. Procedure: Patient was taken the op room placed in supine position after general mask ventilation anesthesia was placed in the right gaze position cerumen was cleaned from the external auditory canal under otomicroscope direction.  The tympanic membrane is thickened and a myringotomy made in the anterior-inferior quadrant.  The effusion that was present was cultured with a trap.  There was no purulence and not much effusion.  There was no granulation tissue in the canal.  The canal did look slightly erythematous.  Ciprodex  was instilled.  The debris and crusting in the concha was removed with the cerumen spoon which did have mild ulceration underneath the crusting.  She was then awakened brought to recovery in stable condition counts correct

## 2024-08-15 NOTE — Progress Notes (Signed)
 BLE venous exam is completed. Bronda Alfred, RVT

## 2024-08-15 NOTE — H&P (Signed)
 History and Physical    Patient: Beth [name] FMW:[id number] DOB: [birth year] DOA: 08/14/2024 DOS: the patient was seen and examined on 08/15/2024 PCP: Zollie Lowers, MD  Patient coming from: [facility] rehabilitation via EMS  Chief Complaint:  Chief Complaint  Patient presents with   Facial Droop   HPI: Beth [name] is a 78 y.o. female with medical history significant of hypertension, hyperlipidemia, diastolic CHF, CVA, diabetes mellitus type 2, and chronic kidney disease presents with left ear pain and facial drooping.  She has persistent left ear pain that has not improved despite treatment with IV antibiotics during her recent hospital stay. The pain is severe and ongoing since her last visit. She has been receiving Tylenol  for pain management at the rehab center, which provides some relief. Last night, she received an unspecified pain medication that was effective. She has been on ciprofloxacin  and steroids for her ear, along with five different ear drops over the past four weeks. No headaches, but the pain is localized to the left side of her face and ear.  She reports facial drooping on the left side, which began two days ago. Her children have noticed a difference in her facial appearance, and she experiences blurred vision on the left side. No difficulty with speech is noted.  She has a history of a knee spacer and reports weakness on the backside. No chest pain, fevers, or chills are reported. She denies any blood in her stools, stomach pain, or vomiting, but does experience nausea. Her last bowel movement was the day before yesterday, and she is still passing gas.   In the ED patient was noted to be afebrile with blood pressures elevated up to 199/98, and all other vital signs relatively maintained.  Labs noted WBC 6.7, hemoglobin 11.8, BUN 32, creatinine 2.03, albumin  2.7, and lactic acid 1.4.  Chest x-ray noted stable enlarged cardiac silhouette with no acute abnormality.  MRI of the  brain was obtained and negative for any acute intercranial abnormality of the left mastoid effusion and opacified right maxillary sinus..  Influenza, COVID-19, and RSV screening were negative.  Blood cultures were obtained.  ENT Dr. Roark was consulted and we will see the patient.  Patient was given fentanyl  25 mcg IV, 100% with codeine , hydralazine  100 mg p.o., vancomycin , and cefepime . Review of Systems: As mentioned in the history of present illness. All other systems reviewed and are negative. Past Medical History:  Diagnosis Date   Anemia    Arthritis    Knee both knees   Blood transfusion without reported diagnosis 2012   anemia;pt denies transfusion stated was only on iron  tablet   Cataract    left   CKD (chronic kidney disease), stage III (HCC)    Diabetes mellitus without complication (HCC)    Family history of anesthesia complication    sister very slow to awaken after anesthesia;severe vomiting    Gout    left elbow   Herpes infection 08/09/2014   Saw doctor Wed. 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   Other acute osteomyelitis, right femur (HCC) 07/23/2020   Pseudomonas aeruginosa infection 12/15/2018   Pseudomonas aeruginosa infection 12/15/2018   Septic arthritis of knee, right (HCC) 08/27/2020   Stroke (HCC) 2006   x  1 no deficits noted    Past Surgical History:  Procedure Laterality Date   ABDOMINAL HYSTERECTOMY  1983   CARPAL TUNNEL RELEASE Right 1983   colonscopy  June 21, 2014   EXCISIONAL TOTAL KNEE ARTHROPLASTY WITH ANTIBIOTIC SPACERS Right 02/16/2015   Procedure: RIGHT KNEE RESECTION ARTHROPLASTY WITH ANTIBIOTIC SPACERS;  Surgeon: Dempsey Moan, MD;  Location: [facility];  Service: Orthopedics;  Laterality: Right;   EXCISIONAL TOTAL KNEE  ARTHROPLASTY WITH ANTIBIOTIC SPACERS Right 11/03/2018   Procedure: Right knee resection arthroplasty; antibiotic spacer;  Surgeon: Moan Dempsey, MD;  Location: [facility];  Service: Orthopedics;  Laterality: Right;  Adductor Block   I & D KNEE WITH POLY EXCHANGE Right 10/02/2014   Procedure: IRRIGATION AND DEBRIDEMENT RIGHT KNEE WITH POLY EXCHANGE;  Surgeon: Dempsey Moan GAILS, MD;  Location: [facility];  Service: Orthopedics;  Laterality: Right;   I & D KNEE WITH POLY EXCHANGE Right 08/27/2020   Procedure: IRRIGATION AND DEBRIDEMENT; SPACER EXCHANGE RIGHT KNEE with multiple specimens;  Surgeon: Moan Dempsey, MD;  Location: [facility];  Service: Orthopedics;  Laterality: Right;    INCISION AND DRAINAGE OF WOUND Right 01/14/2017   Procedure: IRRIGATION AND DEBRIDEMENT WOUND;  Surgeon: Dempsey Moan, MD;  Location: [facility];  Service: Orthopedics;  Laterality: Right;  requests   JOINT REPLACEMENT  06/2014   right knee   nasal cauterization  2012   PATELLAR TENDON REPAIR Right 08/11/2014   Procedure: RIGHT PATELLA TENDON REPAIR;  Surgeon: Dempsey Moan GAILS, MD;  Location: [facility];  Service: Orthopedics;  Laterality: Right;   REIMPLANTATION OF TOTAL KNEE Right 05/23/2015   Procedure: RIGHT KNEE ARTHROPLASTY REIMPLANTATION;  Surgeon: Dempsey Moan, MD;  Location: [facility];  Service: Orthopedics;  Laterality: Right;   TOTAL KNEE ARTHROPLASTY Right 07/03/2014   Procedure: RIGHT TOTAL KNEE ARTHROPLASTY;  Surgeon: Dempsey Moan GAILS, MD;  Location: [facility];  Service: Orthopedics;  Laterality: Right;   TUBAL LIGATION     Social History:  reports that she has never smoked. She has never used smokeless tobacco. She reports that she does not drink alcohol and does not use drugs.  Allergies  Allergen Reactions   Cefdinir Swelling and Rash    Tolerated cephalosporins many times in the past   Aleve [Naproxen Sodium] Other (See Comments)    Heart races   Dorethia Marts ] Other (See Comments)    Nose bleeding    Ciprofloxacin  Other (See Comments)    Possible hamstring tendinopathy   Clonidine  Derivatives Other (See Comments)    Dizziness and weakness   Shellfish Allergy Nausea And Vomiting    Family History  Problem Relation Age of Onset   Ovarian cancer Mother    Cancer Mother    Peripheral vascular disease Father        with amputation of both legs   Hypertension Father    Heart disease Brother 66   Kidney disease Daughter    Heart disease Daughter 56   Diabetes Son    Diabetes Son    Colon cancer Neg Hx    Esophageal cancer Neg Hx    Stomach cancer Neg Hx    Rectal cancer Neg Hx     Prior to Admission medications   Medication Sig Start Date End Date Taking? Authorizing Provider  alendronate  (FOSAMAX ) 70 MG tablet TAKE 1 TABLET WEEKLY (TAKE WITH 8OZ OF WATER  30 MINUTES BEFORE BREAKFAST) Patient taking differently: Take 70 mg by mouth once a week. 05/16/24  Yes Zollie Lowers, MD  aspirin   EC 81 MG tablet Take 1 tablet (81 mg total) by mouth daily. Swallow whole. 08/08/24  Yes Darci Pore, MD  atorvastatin  (LIPITOR ) 40 MG tablet Take 40 mg by mouth at bedtime. 08/08/24  Yes [provider]  azelastine  (ASTELIN ) 0.1 % nasal spray Place 1 spray into both nostrils 2 (two) times daily. Use in each nostril as directed 07/26/24  Yes [facility] Hummer, Nena, NP  ciprofloxacin -dexamethasone  (CIPRODEX ) OTIC suspension Place 4 drops into the left ear 2 (two) times daily. 08/07/24  Yes Darci Pore, MD  colchicine  0.6 MG tablet Take twice daily for gout attack. (may take every two hours up to 6 doses at acute onset) Patient taking differently: Take 0.6 mg by mouth daily as needed (Gout attacks). 03/03/24  Yes Zollie Lowers, MD  diltiazem  (CARDIZEM  CD) 240 MG 24 hr capsule TAKE ONE CAPSULE BY MOUTH DAILY Patient taking differently: Take 240 mg by mouth daily. 05/16/24  Yes Zollie Lowers, MD  empagliflozin  (JARDIANCE ) 10 MG TABS tablet Take 1 tablet (10 mg total) by mouth  daily. Patient taking differently: Take 10 mg by mouth daily. 09/29/23  Yes Zollie Lowers, MD  ferrous sulfate  325 (65 FE) MG tablet Take 325 mg by mouth 2 (two) times a week. Take one tablet by mouth on Tue & Thurs.   Yes [provider]  guaifenesin  (ROBITUSSIN) 100 MG/5ML syrup Take 200 mg by mouth as needed for cough.  08/18/24 Yes [provider]  hydrALAZINE  (APRESOLINE ) 25 MG tablet TAKE ONE (1) TABLET BY MOUTH 3 TIMES DAILY 07/18/24  Yes Zollie Lowers, MD  isosorbide  mononitrate (IMDUR ) 60 MG 24 hr tablet Take 1 tablet (60 mg total) by mouth daily. Patient taking differently: Take 60 mg by mouth daily. 08/08/24  Yes Darci Pore, MD  ondansetron  (ZOFRAN -ODT) 4 MG disintegrating tablet Take 4 mg by mouth every 8 (eight) hours as needed for vomiting or nausea. 08/08/24  Yes [provider]  apixaban  (ELIQUIS ) 5 MG TABS tablet Take 2 tablets (10 mg total) by mouth 2 (two) times daily for 7 days, THEN 1 tablet (5 mg total) 2 (two) times daily. Patient not taking: Reported on 08/15/2024 08/07/24 09/13/24  Darci Pore, MD    Physical Exam: Vitals:   08/15/24 0345 08/15/24 0430 08/15/24 0500 08/15/24 0600  BP: (!) 169/93 (!) 157/92 (!) 154/99 (!) 156/88  Pulse: 76   71  Resp: 18   19  Temp:    98.6 F (37 C)  TempSrc:    Oral  SpO2: 100%   100%  Weight:      Height:        Constitutional: Elderly female who appears to be in some significant discomfort Eyes: PERRL,  ENMT: Mucous membranes are moist.  Left facial droop present. Neck: normal, supple  Respiratory: clear to auscultation bilaterally. Normal respiratory effort. No accessory muscle use.  Cardiovascular: Regular rate and rhythm, no murmurs / rubs / gallops.  +1 edema of the bilateral lower extremities.  Abdomen: no tenderness, no masses palpated.   Bowel sounds positive.  Musculoskeletal: no clubbing / cyanosis.  Deformity of the right knee from prior surgical procedure. Skin: no  rashes, lesions, ulcers. No induration Neurologic: CN 2-12 grossly intact.   Generalized weakness, but able to move all extremities. Psychiatric: Normal judgment and insight. Alert and oriented x 3. Normal mood.   Data Reviewed:  EKG reveals sinus rhythm at 68 bpm with first-degree heart block and QTc 454.  Reviewed labs, imaging, and prior records  as noted.  Assessment and Plan:  Left facial paralysis Left otitis externa Patient presented with intermittent paralysis of the left side of her face.  MRI of the brain did not note any acute intracranial abnormality. Patient has had infection of her left ear for which she has been on topical antibiotics for approximately 4 weeks without improvement.  Blood cultures were obtained.  Patient was started on antibiotics of vancomycin  and cefepime . - Admit to a telemetry bed - Check CRP - N.p.o. for plans for taking to the OR - Prednisone  40 mg daily - ENT consulted,  will follow-up for further recommendations  Hypertensive urgency On admission blood pressures elevated up to 199/98. - Continue hydralazine , Cardizem , and isosorbide  mononitrate  History of pulmonary embolism Last hospitalization patient was found to have elevated D-dimer.  VQ scan on 8/30 which noted small vague which state perfusion deficit in the anterior right mid lung for which a segmental pulmonary embolism could not be excluded.  At that time she was started on Eliquis .  It was recommended that she have a Doppler ultrasound of her lower extremities as well in the outpatient setting. - Continue Eliquis  - Check Doppler ultrasound of lower extremities  Heart failure with preserved EF Echocardiogram from 8/25 noted EF to be 60 to 65% with grade 1 diastolic dysfunction, normal RV, and no significant valvular disease present. - Strict I&O's and daily weights   Controlled diabetes mellitus type 2, without long-term use of insulin  Last hemoglobin A1c noted to be 6.5 on  8/25. -Heart healthy and carb modified diet - Continue Jardiance   Chronic kidney disease stage IV Creatinine noted to be 2.03 with BUN 32 which appears improved from prior hospitalization her creatinine was elevated up to 3.5.   - Continue to monitor kidney function  Debility Right knee injury Patient has difficulty with ambulation due to prior right prosthetic knee infection requiring resection of arthroplasty and placement of antibiotic spacer.  - Continue PT/OT  DVT prophylaxis: Eliquis  Advance Care Planning:   Code Status: Full Code    Consults: ENT  Family Communication: Daughter updated at bedside  Severity of Illness: The appropriate patient status for this patient is INPATIENT. Inpatient status is judged to be reasonable and necessary in order to provide the required intensity of service to ensure the patient's safety. The patient's presenting symptoms, physical exam findings, and initial radiographic and laboratory data in the context of their chronic comorbidities is felt to place them at high risk for further clinical deterioration. Furthermore, it is not anticipated that the patient will be medically stable for discharge from the hospital within 2 midnights of admission.   * I certify that at the point of admission it is my clinical judgment that the patient will require inpatient hospital care spanning beyond 2 midnights from the point of admission due to high intensity of service, high risk for further deterioration and high frequency of surveillance required.*  Author: Maximino DELENA Sharps, MD 08/15/2024 8:04 AM  For on call review [email or web address].

## 2024-08-15 NOTE — Progress Notes (Signed)
 Pharmacy Antibiotic Note  Beth [name] is a 78 y.o. female admitted on 08/14/2024 with Left Otitis externawithout improvement after 4 weeks of topical antibiotics.  Pharmacy has been consulted for vancomycin  / cefepime  dosing.  Noted in hospitalist's note that ENT has been consulted.   100.4 kg, 5'5, Scr 2.20 (baseline ~ 1.5), est CrCl = 24, vd 0.5.   Plan: Vancomycin  1000 mg q48h, AUC of 507. Adjust based on Scr, consider random dosing based on levels if Scr worsens.  Cefepime  2 g q24h  Follow up cultures and consult recommendations   Height: 5' 5 (165.1 cm) Weight: 100.4 kg (221 lb 5.5 oz) IBW/kg (Calculated) : 57  Temp (24hrs), Avg:98.1 F (36.7 C), Min:97.4 F (36.3 C), Max:98.6 F (37 C)  Recent Labs  Lab 08/14/24 2100 08/14/24 2106  WBC 6.7  --   CREATININE 2.03* 2.20*  LATICACIDVEN  --  1.4    Estimated Creatinine Clearance: 24 mL/min (A) (by C-G formula based on SCr of 2.2 mg/dL (H)).    Allergies  Allergen Reactions   Cefdinir Swelling and Rash    Tolerated cephalosporins many times in the past   Aleve [Naproxen Sodium] Other (See Comments)    Heart races   Dorethia Rise ] Other (See Comments)    Nose bleeding   Ciprofloxacin  Other (See Comments)    Possible hamstring tendinopathy   Clonidine  Derivatives Other (See Comments)    Dizziness and weakness   Shellfish Allergy Nausea And Vomiting    Antimicrobials this admission: Vancomcyin 9/7 >> c Cefepime  9/7 >> c   Microbiology results: 9/7 BCx: pending  Thank you for allowing pharmacy to be a part of this patient's care.  Rankin Sams 08/15/2024 9:03 AM

## 2024-08-15 NOTE — Plan of Care (Signed)
   Problem: Education: Goal: Knowledge of General Education information will improve Description Including pain rating scale, medication(s)/side effects and non-pharmacologic comfort measures Outcome: Progressing

## 2024-08-15 NOTE — ED Notes (Signed)
 IV laying in bed when going to hook pts antibiotics up. Tegaderm and tape dressing still in place, with IV laying in bed. Short stay at bed at this time, nurse notified of IV situation and pt sent up with IV anitbiotics

## 2024-08-15 NOTE — ED Notes (Signed)
 Pt cleaned and changed out of clothes. Pt resting with family at bedside at this time

## 2024-08-15 NOTE — Anesthesia Preprocedure Evaluation (Addendum)
 Anesthesia Evaluation  Patient identified by MRN, date of birth, ID band Patient awake    Reviewed: Allergy & Precautions, NPO status , Patient's Chart, lab work & pertinent test results, reviewed documented beta blocker date and time   History of Anesthesia Complications (+) Family history of anesthesia reaction  Airway Mallampati: III  TM Distance: >3 FB     Dental no notable dental hx. (+) Missing, Poor Dentition,    Pulmonary PE (segmental)   breath sounds clear to auscultation       Cardiovascular hypertension, + CAD, + Past MI and +CHF  + Valvular Problems/Murmurs  Rhythm:Regular Rate:Normal  Recent NSTEMI in the setting of severe hypertension. Angiography deferred per cardiology in favor of medical management.   1. Left ventricular ejection fraction, by estimation, is 60 to 65%. The  left ventricle has normal function. The left ventricle has no regional  wall motion abnormalities. Left ventricular diastolic parameters are  consistent with Grade I diastolic  dysfunction (impaired relaxation).   2. Right ventricular systolic function is normal. The right ventricular  size is normal.   3. The mitral valve is normal in structure. No evidence of mitral valve  regurgitation. No evidence of mitral stenosis.   4. The aortic valve is tricuspid. Aortic valve regurgitation is not  visualized. Aortic valve sclerosis/calcification is present, without any  evidence of aortic stenosis. Aortic valve Vmax measures 1.25 m/s.   5. The inferior vena cava is normal in size with greater than 50%  respiratory variability, suggesting right atrial pressure of 3 mmHg.      Neuro/Psych  Neuromuscular disease CVA    GI/Hepatic   Endo/Other  diabetes    Renal/GU ARF and CRFRenal disease     Musculoskeletal  (+) Arthritis ,    Abdominal   Peds  Hematology  (+) Blood dyscrasia, anemia   Anesthesia Other Findings    Reproductive/Obstetrics                              Anesthesia Physical Anesthesia Plan  ASA: 3  Anesthesia Plan: General   Post-op Pain Management:    Induction: Intravenous  PONV Risk Score and Plan: 2 and Ondansetron  and Dexamethasone   Airway Management Planned: LMA  Additional Equipment:   Intra-op Plan:   Post-operative Plan: Extubation in OR  Informed Consent: I have reviewed the patients History and Physical, chart, labs and discussed the procedure including the risks, benefits and alternatives for the proposed anesthesia with the patient or authorized representative who has indicated his/her understanding and acceptance.     Dental advisory given  Plan Discussed with: CRNA  Anesthesia Plan Comments:         Anesthesia Quick Evaluation

## 2024-08-15 NOTE — ED Notes (Signed)
  at bedside

## 2024-08-16 ENCOUNTER — Encounter (HOSPITAL_COMMUNITY): Payer: Self-pay | Admitting: Otolaryngology

## 2024-08-16 DIAGNOSIS — G510 Bell's palsy: Secondary | ICD-10-CM | POA: Diagnosis not present

## 2024-08-16 LAB — CBC WITH DIFFERENTIAL/PLATELET
Abs Immature Granulocytes: 0.03 K/uL (ref 0.00–0.07)
Basophils Absolute: 0 K/uL (ref 0.0–0.1)
Basophils Relative: 1 %
Eosinophils Absolute: 0.2 K/uL (ref 0.0–0.5)
Eosinophils Relative: 4 %
HCT: 34.1 % — ABNORMAL LOW (ref 36.0–46.0)
Hemoglobin: 10.5 g/dL — ABNORMAL LOW (ref 12.0–15.0)
Immature Granulocytes: 1 %
Lymphocytes Relative: 11 %
Lymphs Abs: 0.6 K/uL — ABNORMAL LOW (ref 0.7–4.0)
MCH: 26.3 pg (ref 26.0–34.0)
MCHC: 30.8 g/dL (ref 30.0–36.0)
MCV: 85.5 fL (ref 80.0–100.0)
Monocytes Absolute: 0.5 K/uL (ref 0.1–1.0)
Monocytes Relative: 9 %
Neutro Abs: 4.2 K/uL (ref 1.7–7.7)
Neutrophils Relative %: 74 %
Platelets: 339 K/uL (ref 150–400)
RBC: 3.99 MIL/uL (ref 3.87–5.11)
RDW: 16.1 % — ABNORMAL HIGH (ref 11.5–15.5)
WBC: 5.6 K/uL (ref 4.0–10.5)
nRBC: 0 % (ref 0.0–0.2)

## 2024-08-16 LAB — BASIC METABOLIC PANEL WITH GFR
Anion gap: 14 (ref 5–15)
BUN: 28 mg/dL — ABNORMAL HIGH (ref 8–23)
CO2: 18 mmol/L — ABNORMAL LOW (ref 22–32)
Calcium: 8.7 mg/dL — ABNORMAL LOW (ref 8.9–10.3)
Chloride: 108 mmol/L (ref 98–111)
Creatinine, Ser: 1.95 mg/dL — ABNORMAL HIGH (ref 0.44–1.00)
GFR, Estimated: 26 mL/min — ABNORMAL LOW (ref >60)
Glucose, Bld: 120 mg/dL — ABNORMAL HIGH (ref 70–99)
Potassium: 4.1 mmol/L (ref 3.5–5.1)
Sodium: 140 mmol/L (ref 135–145)

## 2024-08-16 LAB — MAGNESIUM: Magnesium: 1.9 mg/dL (ref 1.7–2.4)

## 2024-08-16 LAB — PROCALCITONIN: Procalcitonin: <0.1 ng/mL

## 2024-08-16 LAB — C-REACTIVE PROTEIN: CRP: 2.8 mg/dL — ABNORMAL HIGH (ref <1.0)

## 2024-08-16 NOTE — Evaluation (Signed)
 Physical Therapy Evaluation Patient Details Name: Beth [name] MRN: [id number] DOB: [birth year] Today's Date: 08/16/2024  History of Present Illness  Pt is a 78 y.o. F who presents with left ear pain and facial drooping. Negative MRI brain. Seen by ENT, underwent left myringotomy and tube placement on 08/15/2024, and on antibiotics. PMhx: HTN, HLD, dCHF, CVA, T2DM, CKD, Rt TKA with infection and replacement, PE.  Clinical Impression  Pt admitted with above. Pt previously at rehab at [facility] and working with PT on standing; still requires assist for ADL's. Donned R knee brace prior to maneuvering to edge of bed. Pt requiring minA + 2 for stand pivot transfers using RW. Pt has made significant progress since her last admission and will benefit from continued post acute rehabilitation to address strengthening, balance, ROM, and transfer/gait training.        If plan is discharge home, recommend the following: A lot of help with walking and/or transfers;A lot of help with bathing/dressing/bathroom;Assistance with feeding;Assist for transportation;Direct supervision/assist for financial management;Assistance with cooking/housework;Direct supervision/assist for medications management;Supervision due to cognitive status   Can travel by private vehicle   No    Equipment Recommendations Other (comment) (defer)  Recommendations for Other Services       Functional Status Assessment Patient has had a recent decline in their functional status and/or demonstrates limited ability to make significant improvements in function in a reasonable and predictable amount of time     Precautions / Restrictions Precautions Precautions: Fall Required Braces or Orthoses: Other Brace Other Brace: R knee brace for mobility Restrictions Weight Bearing Restrictions Per Provider Order: No      Mobility  Bed Mobility Overal bed mobility: Needs Assistance Bed Mobility: Supine to Sit     Supine to sit: Contact  guard     General bed mobility comments: Pt able to maneuver to left side of bed with increased time, uses arm to progress RLE off edge of bed    Transfers Overall transfer level: Needs assistance Equipment used: Rolling walker (2 wheels) Transfers: Sit to/from Stand, Bed to chair/wheelchair/BSC Sit to Stand: Min assist, +2 physical assistance Stand pivot transfers: Min assist, +2 physical assistance         General transfer comment: MinA + 2 to power up from bed (pt pulling up on walker), R foot block provided, pivoting over towards left with increased time and difficulty achieving adequate foot clearance    Ambulation/Gait                  Stairs            Wheelchair Mobility     Tilt Bed    Modified Rankin (Stroke Patients Only)       Balance Overall balance assessment: Needs assistance Sitting-balance support: Feet supported Sitting balance-[name]: Fair     Standing balance support: Bilateral upper extremity supported Standing balance-[name]: Poor                               Pertinent Vitals/Pain Pain Assessment Pain Assessment: Faces Faces Pain Scale: Hurts a little bit Pain Location: L ear Pain Descriptors / Indicators: Discomfort, Other (Comment) (fullness) Pain Intervention(s): Monitored during session    Home Living Family/patient expects to be discharged to:: Skilled nursing facility                   Additional Comments: [name]    Prior  Function Prior Level of Function : Needs assist             Mobility Comments: Working on standing with PT ADLs Comments: Assist for ADL's from staff     Extremity/Trunk Assessment   Upper Extremity Assessment Upper Extremity Assessment: Defer to OT evaluation    Lower Extremity Assessment Lower Extremity Assessment: Generalized weakness    Cervical / Trunk Assessment Cervical / Trunk Assessment: Kyphotic  Communication    Communication Communication: No apparent difficulties    Cognition Arousal: Alert Behavior During Therapy: WFL for tasks assessed/performed   PT - Cognitive impairments: No apparent impairments                         Following commands: Intact       Cueing Cueing Techniques: Verbal cues     General Comments      Exercises     Assessment/Plan    PT Assessment Patient needs continued PT services  PT Problem List Decreased strength;Decreased activity tolerance;Decreased balance;Decreased range of motion;Decreased cognition;Decreased coordination;Decreased safety awareness;Decreased mobility;Obesity       PT Treatment Interventions Gait training;DME instruction;Balance training;Neuromuscular re-education;Cognitive remediation;Therapeutic activities;Functional mobility training;Therapeutic exercise;Patient/family education    PT Goals (Current goals can be found in the Care Plan section)  Acute Rehab PT Goals Patient Stated Goal: get stronger PT Goal Formulation: With patient/family Time For Goal Achievement: 08/30/24 Potential to Achieve Goals: Fair    Frequency Min 2X/week     Co-evaluation               AM-PAC PT 6 Clicks Mobility  Outcome Measure Help needed turning from your back to your side while in a flat bed without using bedrails?: A Little Help needed moving from lying on your back to sitting on the side of a flat bed without using bedrails?: A Little Help needed moving to and from a bed to a chair (including a wheelchair)?: A Little Help needed standing up from a chair using your arms (e.g., wheelchair or bedside chair)?: A Little Help needed to walk in hospital room?: Total Help needed climbing 3-5 steps with a railing? : Total 6 Click Score: 14    End of Session Equipment Utilized During Treatment: Gait belt Activity Tolerance: Patient tolerated treatment well Patient left: in chair;with call bell/phone within reach;with chair alarm  set Nurse Communication: Mobility status PT Visit Diagnosis: Other abnormalities of gait and mobility (R26.89);Difficulty in walking, not elsewhere classified (R26.2);Muscle weakness (generalized) (M62.81)    Time: 8972-8942 PT Time Calculation (min) (ACUTE ONLY): 30 min   Charges:   PT Evaluation $PT Eval Moderate Complexity: 1 Mod PT Treatments $Therapeutic Activity: 8-22 mins PT General Charges $$ ACUTE PT VISIT: 1 Visit         Aleck Daring, PT, DPT Acute Rehabilitation Services Office [phone]   Aleck ONEIDA Daring 08/16/2024, 12:52 PM

## 2024-08-16 NOTE — Plan of Care (Signed)

## 2024-08-16 NOTE — NC FL2 (Signed)
 [place]  MEDICAID FL2 LEVEL OF CARE FORM     IDENTIFICATION  Patient Name: Beth [name] Birthdate: 1946-10-24 Sex: female Admission Date (Current Location): 08/14/2024  [facility] and [place] Number:  Producer, television/film/video and Address:  [facility], 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 08/15/2024   History of pulmonary embolism 08/15/2024   Obesity (BMI 30-39.9) 08/03/2024   Acute kidney injury superimposed on chronic kidney disease (HCC) 08/01/2024   Prolonged QT interval 08/01/2024   Elevated troponin 08/01/2024   Hypertensive urgency 08/01/2024   Acute metabolic encephalopathy 08/01/2024   Otitis externa 08/01/2024   Heart failure with preserved ejection fraction (HCC) 08/01/2024   Sore throat 08/01/2024   Debility 08/01/2024   CKD (chronic kidney disease), stage IV (HCC) 07/25/2024   Hospital discharge follow-up 07/25/2024   Stage 2 chronic kidney disease 07/25/2024   Uncontrolled hypertension 07/25/2024   Neck pain, musculoskeletal 07/25/2024   Acute suppurative otitis media of left ear without spontaneous rupture of tympanic membrane 07/25/2024   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 12/31/2023   CHF (congestive heart failure) (HCC) 08/20/2023   Murmur 09/24/2021   Hamstring tendinitis of right thigh 08/22/2019   Osteoarthritis of both sacroiliac joints (HCC) 08/22/2019   Diabetes type 2, controlled (HCC) 03/07/2016   Gout    Essential hypertension    Enuresis 11/19/2015   HLD  (hyperlipidemia) 01/04/2014   Hypokalemia 01/04/2014   Anemia, iron  deficiency 12/30/2011   Constipation 04/04/2009   TUBULOVILLOUS ADENOMA, COLON 04/03/2009    Orientation RESPIRATION BLADDER Height & Weight     Self, Time, Situation, Place  Normal Incontinent Weight: 219 lb (99.3 kg) Height:  5' 5 (165.1 cm)  BEHAVIORAL SYMPTOMS/MOOD NEUROLOGICAL BOWEL NUTRITION STATUS      Incontinent Diet (See dc summary)  AMBULATORY STATUS COMMUNICATION OF NEEDS Skin   Extensive Assist Verbally Normal                       Personal Care Assistance Level of Assistance  Bathing, Feeding, Dressing Bathing Assistance: Maximum assistance Feeding assistance: Limited assistance Dressing Assistance: Maximum assistance     Functional Limitations Info  Sight, Hearing Sight Info: Impaired Hearing Info: Impaired      SPECIAL CARE FACTORS FREQUENCY  PT (By licensed PT), OT (By licensed OT)     PT Frequency: 5x/week OT Frequency: 5x/week            Contractures Contractures Info: Not present    Additional Factors Info  Code Status, Allergies Code Status Info: Full Allergies Info: Cefdinir, Aleve (Naproxen Sodium), Asa (Aspirin ), Ciprofloxacin , Clonidine  Derivatives, Shellfish Allergy           Current Medications (08/16/2024):  This is the current hospital active medication list Current Facility-Administered Medications  Medication Dose Route Frequency Provider Last Rate Last Admin   acetaminophen  (TYLENOL ) tablet 650 mg  650 mg Oral Q6H PRN Roark Rush, MD       Or   acetaminophen  (  TYLENOL ) suppository 650 mg  650 mg Rectal Q6H PRN Roark Rush, MD       albuterol  (PROVENTIL ) (2.5 MG/3ML) 0.083% nebulizer solution 2.5 mg  2.5 mg Nebulization Q6H PRN Roark Rush, MD       Ampicillin -Sulbactam (UNASYN ) 3 g in sodium chloride  0.9 % 100 mL IVPB  3 g Intravenous Q12H Roark Rush, MD 200 mL/hr at 08/16/24 1021 3 g at 08/16/24 1021   apixaban  (ELIQUIS ) tablet 5 mg  5 mg Oral BID  Roark Rush, MD   5 mg at 08/16/24 1017   aspirin  EC tablet 81 mg  81 mg Oral Daily Roark Rush, MD   81 mg at 08/16/24 1016   atorvastatin  (LIPITOR ) tablet 40 mg  40 mg Oral QHS Roark Rush, MD   40 mg at 08/15/24 2130   benzonatate  (TESSALON ) capsule 200 mg  200 mg Oral TID PRN Roark Rush, MD       diltiazem  (CARDIZEM  CD) 24 hr capsule 240 mg  240 mg Oral Daily Roark Rush, MD   240 mg at 08/16/24 1016   empagliflozin  (JARDIANCE ) tablet 10 mg  10 mg Oral Daily Roark Rush, MD   10 mg at 08/16/24 1017   fentaNYL  (SUBLIMAZE ) injection 25 mcg  25 mcg Intravenous Q2H PRN Roark Rush, MD   25 mcg at 08/15/24 1712   hydrALAZINE  (APRESOLINE ) injection 10 mg  10 mg Intravenous Q4H PRN Roark Rush, MD       hydrALAZINE  (APRESOLINE ) tablet 100 mg  100 mg Oral TID Roark Rush, MD   100 mg at 08/16/24 1017   HYDROcodone -acetaminophen  (NORCO/VICODIN) 5-325 MG per tablet 1 tablet  1 tablet Oral Q6H PRN Roark Rush, MD   1 tablet at 08/16/24 1153   isosorbide  mononitrate (IMDUR ) 24 hr tablet 60 mg  60 mg Oral Daily Roark Rush, MD   60 mg at 08/16/24 1016     Discharge Medications: Please see discharge summary for a list of discharge medications.  Relevant Imaging Results:  Relevant Lab Results:   Additional Information SSN: [id number]  Jaykob Minichiello S Lonette Stevison, LCSW

## 2024-08-16 NOTE — Evaluation (Signed)
 Occupational Therapy Evaluation Patient Details Name: Beth [name] MRN: [id number] DOB: 1946/05/12 Today's Date: 08/16/2024   History of Present Illness   Pt is a 78 y.o. F who presents with left ear pain and facial drooping. Negative MRI brain. Seen by ENT, underwent left myringotomy and tube placement on 08/15/2024, and on antibiotics. PMhx: HTN, HLD, dCHF, CVA, T2DM, CKD, Rt TKA with infection and replacement, PE.     Clinical Impressions Pt c/o generalized pain and feeling unwell. Pt from [facility], plan is to return, PLOF needing significant help with standing, transfers, dressing/bathing. Pt currently close to recent baseline, min A x2 for stand pivot to recliner with difficulty lifting RLE to take step or scooting it over making transfers difficult. Total for LB ADLs but Pt reports is able to use sock aide and reacher for don/doff socks. Pt max A for sit to supine, difficulty lifting BLEs onto bed and scooting back. Pt not able to laterally scoot on EOB without max/total assist. Pt would benefit from continued acute OT to maximize functional strength, DC plan to return to postacute rehab <3hrs/day.      If plan is discharge home, recommend the following:   Two people to help with walking and/or transfers;Two people to help with bathing/dressing/bathroom;Assist for transportation;Help with stairs or ramp for entrance     Functional Status Assessment   Patient has had a recent decline in their functional status and demonstrates the ability to make significant improvements in function in a reasonable and predictable amount of time.     Equipment Recommendations   Other (comment) (defer)     Recommendations for Other Services         Precautions/Restrictions   Precautions Precautions: Fall Required Braces or Orthoses: Other Brace Other Brace: R knee brace for mobility Restrictions Weight Bearing Restrictions Per Provider Order: No     Mobility Bed  Mobility Overal bed mobility: Needs Assistance Bed Mobility: Sit to Supine       Sit to supine: Max assist, +2 for physical assistance   General bed mobility comments: max A x2 for sit to supine    Transfers Overall transfer level: Needs assistance Equipment used: Rolling walker (2 wheels) Transfers: Sit to/from Stand, Bed to chair/wheelchair/BSC Sit to Stand: Min assist, +2 physical assistance Stand pivot transfers: Min assist, +2 physical assistance         General transfer comment: min A x2 for STS and stand pivot, difficulty lifting RLE or scooting it along floor to pivot, does not control sitting to lower surfaces well      Balance Overall balance assessment: Needs assistance Sitting-balance support: No upper extremity supported, Feet supported Sitting balance-[name]: Fair     Standing balance support: Bilateral upper extremity supported, During functional activity, Reliant on assistive device for balance Standing balance-[name]: Poor Standing balance comment: reliant on RW for support                           ADL either performed or assessed with clinical judgement   ADL Overall ADL's : Needs assistance/impaired Eating/Feeding: Set up;Sitting   Grooming: Set up;Sitting   Upper Body Bathing: Moderate assistance;Sitting   Lower Body Bathing: Total assistance;Sitting/lateral leans   Upper Body Dressing : Minimal assistance;Sitting   Lower Body Dressing: Total assistance;Sitting/lateral leans   Toilet Transfer: Minimal assistance;Moderate assistance;+2 for physical assistance;+2 for safety/equipment;BSC/3in1;Rolling walker (2 wheels)   Toileting- Clothing Manipulation and Hygiene: Total assistance  General ADL Comments: Pt requires mod/total for UB/LB ADLs, poor standing tolerance, difficulty lifting RLE during stand pivot trasnfers, not able to control sitting to lower surfaces     Vision Baseline Vision/History: 0 No visual  deficits Ability to See in Adequate Light: 0 Adequate Patient Visual Report: Blurring of vision;Other (comment) (Pt reports L eye blurry vision)       Perception         Praxis         Pertinent Vitals/Pain Pain Assessment Pain Assessment: No/denies pain Faces Pain Scale: Hurts a little bit Pain Location: L ear Pain Descriptors / Indicators: Aching, Discomfort Pain Intervention(s): Monitored during session     Extremity/Trunk Assessment Upper Extremity Assessment Upper Extremity Assessment: Generalized weakness;RUE deficits/detail;LUE deficits/detail RUE Deficits / Details: generalized weakness, decreased shoulder ROM LUE Deficits / Details: generalized weakness, decreased shoulder ROM   Lower Extremity Assessment Lower Extremity Assessment: Generalized weakness   Cervical / Trunk Assessment Cervical / Trunk Assessment: Kyphotic   Communication Communication Communication: No apparent difficulties   Cognition Arousal: Alert Behavior During Therapy: WFL for tasks assessed/performed Cognition: No apparent impairments             OT - Cognition Comments: A/O x4, fully participates                 Following commands: Intact       Cueing  General Comments   Cueing Techniques: Verbal cues      Exercises     Shoulder Instructions      Home Living Family/patient expects to be discharged to:: Skilled nursing facility                                 Additional Comments: [name]      Prior Functioning/Environment Prior Level of Function : Needs assist             Mobility Comments: Working on standing with PT ADLs Comments: Assist for ADL's from staff    OT Problem List: Decreased strength;Decreased range of motion;Decreased activity tolerance;Impaired balance (sitting and/or standing);Cardiopulmonary status limiting activity;Pain   OT Treatment/Interventions: Self-care/ADL training;Therapeutic exercise;Energy  conservation;DME and/or AE instruction;Therapeutic activities;Balance training;Patient/family education      OT Goals(Current goals can be found in the care plan section)   Acute Rehab OT Goals Patient Stated Goal: to improve strength OT Goal Formulation: With patient Time For Goal Achievement: 08/30/24 Potential to Achieve Goals: Good   OT Frequency:  Min 2X/week    Co-evaluation              AM-PAC OT 6 Clicks Daily Activity     Outcome Measure Help from another person eating meals?: None Help from another person taking care of personal grooming?: A Little Help from another person toileting, which includes using toliet, bedpan, or urinal?: Total Help from another person bathing (including washing, rinsing, drying)?: A Lot Help from another person to put on and taking off regular upper body clothing?: A Little Help from another person to put on and taking off regular lower body clothing?: Total 6 Click Score: 14   End of Session Equipment Utilized During Treatment: Gait belt;Rolling walker (2 wheels) Nurse Communication: Mobility status  Activity Tolerance: Patient tolerated treatment well Patient left: in bed;with call bell/phone within reach  OT Visit Diagnosis: Unsteadiness on feet (R26.81);Other abnormalities of gait and mobility (R26.89);Muscle weakness (generalized) (M62.81);Pain  Time: 8547-8482 OT Time Calculation (min): 25 min Charges:  OT General Charges $OT Visit: 1 Visit OT Evaluation $OT Eval Moderate Complexity: 1 Mod OT Treatments $Self Care/Home Management : 8-22 mins  [address] OTR/L   Elouise JONELLE Bott 08/16/2024, 3:24 PM

## 2024-08-16 NOTE — Progress Notes (Signed)
 Patient ID: Beth [name], female   DOB: 12/24/1945, 78 y.o.   MRN: [id number]  She is doing better. She has no pain for the first time in weeks. No drainage from the ear.   VII nerve still weak on the left.    She should get some antibiotic cream to the concha of the left ear where all the crusting was. I am at a loss to specifically say why the facial nerve paresis given no pus in the middle ear and no obvious canal issue or parotid mass. It may be herpes zoster given the ulceration on the concha that is healing. It is most likely to late to treat with valtrex  but check with infectious disease. Continue the steroids for the facial nerve. And the ciprodex  in the left ear.

## 2024-08-16 NOTE — TOC Initial Note (Signed)
 Transition of Care [facility]) - Initial/Assessment Note    Patient Details  Name: Beth [name] MRN: [id number] Date of Birth: 30-Jul-1946  Transition of Care [facility]) CM/SW Contact:    Inocente GORMAN Kindle, LCSW Phone Number: 08/16/2024, 2:20 PM  Clinical Narrative:                 CSW met with patient and her daughter at bedside with patient's verbal consent. They reported agreement with returning to [place] at discharge. CSW explained insurance pre-approval process for SNF and ambulance. They reported understanding. CSW will continue to follow.   Expected Discharge Plan: Skilled Nursing Facility Barriers to Discharge: Continued Medical Work up, English as a second language teacher   Patient Goals and CMS Choice Patient states their goals for this hospitalization and ongoing recovery are:: Return to rehab CMS Medicare.gov Compare Post Acute Care list provided to:: Patient Choice offered to / list presented to : Patient [employer] ownership interest in [facility].provided to:: Patient    Expected Discharge Plan and Services In-house Referral: Clinical Social Work   Post Acute Care Choice: Skilled Nursing Facility Living arrangements for the past 2 months: Single Family Home                                      Prior Living Arrangements/Services Living arrangements for the past 2 months: Single Family Home Lives with:: Self Patient language and need for interpreter reviewed:: Yes Do you feel safe going back to the place where you live?: Yes      Need for Family Participation in Patient Care: Yes (Comment) Care giver support system in place?: Yes (comment) Current home services: DME Criminal Activity/Legal Involvement Pertinent to Current Situation/Hospitalization: No - Comment as needed  Activities of Daily Living   ADL Screening (condition at time of admission) Independently performs ADLs?: No Does the patient have a NEW difficulty with bathing/dressing/toileting/self-feeding that  is expected to last >3 days?: No Does the patient have a NEW difficulty with getting in/out of bed, walking, or climbing stairs that is expected to last >3 days?: No Does the patient have a NEW difficulty with communication that is expected to last >3 days?: No Is the patient deaf or have difficulty hearing?: No Does the patient have difficulty seeing, even when wearing glasses/contacts?: No Does the patient have difficulty concentrating, remembering, or making decisions?: No  Permission Sought/Granted Permission sought to share information with : Facility Medical sales representative Permission granted to share information with : Yes, Verbal Permission Granted  Share Information with NAME: Lenon Craze -Daughter [phone]  Permission granted to share info w AGENCY: [name]        Emotional Assessment Appearance:: Appears stated age Attitude/Demeanor/Rapport: Engaged Affect (typically observed): Accepting, Pleasant, Appropriate Orientation: : Oriented to Self, Oriented to Place, Oriented to  Time, Oriented to Situation Alcohol / Substance Use: Not Applicable Psych Involvement: No (comment)  Admission diagnosis:  Otitis externa [H60.90] Patient Active Problem List   Diagnosis Date Noted   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 08/15/2024   History of pulmonary embolism 08/15/2024   Obesity (BMI 30-39.9) 08/03/2024   Acute kidney injury superimposed on chronic kidney disease (HCC) 08/01/2024   Prolonged QT interval 08/01/2024   Elevated troponin 08/01/2024   Hypertensive urgency 08/01/2024   Acute metabolic encephalopathy 08/01/2024   Otitis externa 08/01/2024   Heart failure with preserved ejection fraction (HCC) 08/01/2024   Sore throat 08/01/2024   Debility  08/01/2024   CKD (chronic kidney disease), stage IV (HCC) 07/25/2024   Hospital discharge follow-up 07/25/2024   Stage 2 chronic kidney disease 07/25/2024   Uncontrolled hypertension 07/25/2024   Neck pain, musculoskeletal  07/25/2024   Acute suppurative otitis media of left ear without spontaneous rupture of tympanic membrane 07/25/2024   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 [place] [address] Phone: [phone] Fax: [phone]  [facility] [address] [place] [id number] [place] [address] Phone: [phone] Fax: [phone]  [facility] - [place], [place] - [address] [place] [address] Phone: [phone] Fax: [phone]  [facility] - [place], [place] - [address] [place] [address] [place] [address] [place] [address] Phone: [phone] Fax: [phone]     Social Drivers of Health (SDOH) Social History: SDOH Screenings   Food Insecurity: No Food Insecurity (08/15/2024)  Housing: Low Risk  (08/15/2024)  Transportation Needs: No Transportation Needs (08/15/2024)  Recent Concern: Transportation Needs - Unmet Transportation Needs (07/20/2024)   Received from [facility]  Utilities: Not At Risk (08/15/2024)  Alcohol Screen: Low Risk  (12/30/2023)  Depression (PHQ2-9): Medium Risk (07/20/2024)  Financial Resource Strain: Low Risk  (07/20/2024)   Received from [facility]  Physical Activity: Inactive (07/20/2024)   Received from [facility]  Social Connections: Moderately Integrated (08/15/2024)  Recent Concern:  Social Connections - Socially Isolated (07/20/2024)   Received from [facility]  Stress: No Stress Concern Present (07/20/2024)   Received from [facility]  Tobacco Use: Low Risk  (08/15/2024)  Health Literacy: Low Risk  (07/20/2024)   Received from [facility]   SDOH Interventions:     Readmission Risk Interventions     No data to display

## 2024-08-16 NOTE — Anesthesia Postprocedure Evaluation (Signed)
 Anesthesia Post Note  Patient: Beth [name]  Procedure(s) Performed: MYRINGOTOMY WITH LEFT EAR TUBE PLACEMENT (Left: Ear)     Patient location during evaluation: PACU Anesthesia Type: General Level of consciousness: awake and alert Pain management: pain level controlled Vital Signs Assessment: post-procedure vital signs reviewed and stable Respiratory status: spontaneous breathing, nonlabored ventilation, respiratory function stable and patient connected to nasal cannula oxygen Cardiovascular status: blood pressure returned to baseline and stable Postop Assessment: no apparent nausea or vomiting Anesthetic complications: no   No notable events documented.  Last Vitals:  Vitals:   08/16/24 0400 08/16/24 0832  BP: (!) 164/92 (!) 154/81  Pulse: 89 87  Resp: 17 12  Temp: 36.9 C 36.7 C  SpO2: 94% 94%    Last Pain:  Vitals:   08/16/24 0832  TempSrc: Oral  PainSc:                  [name]

## 2024-08-16 NOTE — Progress Notes (Signed)
 PROGRESS NOTE                                                                                                                                                                                                             Patient Demographics:    Beth [name], is a 78 y.o. female, DOB - 08/19/1946, FMW:[id number]  Outpatient Primary MD for the patient is Zollie Lowers, MD    LOS - 1  Admit date - 08/14/2024    Chief Complaint  Patient presents with   Facial Droop       Brief Narrative (HPI from H&P)    78 y.o. female with medical history significant of hypertension, hyperlipidemia, diastolic CHF, CVA, diabetes mellitus type 2, and chronic kidney disease presents with left ear pain and facial drooping.   She has persistent left ear pain that has not improved despite treatment with IV antibiotics during her recent hospital stay. The pain is severe and ongoing since her last visit. She has been receiving Tylenol  for pain management at the rehab center, which provides some relief. Last night, she received an unspecified pain medication that was effective. She has been on ciprofloxacin  and steroids for her ear, along with five different ear drops over the past four weeks. No headaches, but the pain is localized to the left side of her face and ear.   She reports facial drooping on the left side, which began two days ago. Her children have noticed a difference in her facial appearance, and she experiences blurred vision on the left side. No difficulty with speech is noted.  He was diagnosed with left otitis media, MRI of the brain was acute ENT was consulted and she was admitted.   Subjective:    Beth [name] today has, No headache, No chest pain, No abdominal pain - No Nausea, No new weakness tingling or numbness, no shortness of breath, left ear discomfort much improved.   Assessment  & Plan :   Facial droop with left otitis media with  some compression on left facial nerve with facial nerve weakness.  Negative MRI brain, seen by ENT, underwent left myringotomy and tube placement by Dr. Roark on 08/15/2024, on antibiotics and improving.  Continue to monitor.   Hypertensive urgency On admission blood pressures elevated up to 199/98. - Continue hydralazine , Cardizem , and isosorbide  mononitrate  History of pulmonary embolism Continue Eliquis  no acute issues, lower extremity venous duplex nonacute.   Heart failure with preserved EF Echocardiogram from 8/25 noted EF to be 60 to 65% with grade 1 diastolic dysfunction, normal RV, and no significant valvular disease present. - Strict I&O's and daily weights    Chronic kidney disease stage IV Creatinine noted to be 2.03 with BUN 32 which appears improved from prior hospitalization her creatinine was elevated up to 3.5.   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  - Continue PT/OT, lower extremity venous duplex nonacute.    Controlled diabetes mellitus type 2, without long-term use of insulin  Last hemoglobin A1c noted to be 6.5 on 8/25. -Heart healthy and carb modified diet - Continue Jardiance     Lab Results  Component Value Date   HGBA1C 6.5 (H) 08/01/2024   CBG (last 3)  Recent Labs    08/15/24 1109 08/15/24 1224  GLUCAP 108* 119*          Condition -  Guarded  Family Communication  :  None  Code Status :  Full  Consults  :  ENT  PUD Prophylaxis :    Procedures  :     Left ear surgery by Dr. Roark on 08/15/2024.    Preop/postop diagnosis: Left otitis media Procedure: Left myringotomy and tube with culture  Venous ultrasound.  No DVT.    CT head. 1. No evidence of acute intracranial abnormality. 2. Moderate chronic microvascular ischemic disease. 3. Numerous chronic  microhemorrhages, likely due to chronic hypertension. 4. Left mastoid effusion. 5. Opacified right maxillary sinus.       Disposition Plan  :    Status is: Inpatient   DVT Prophylaxis  :     apixaban  (ELIQUIS ) tablet 5 mg     Lab Results  Component Value Date   PLT 339 08/16/2024    Diet :  Diet Order             Diet heart healthy/carb modified Room service appropriate? Yes; Fluid consistency: Thin  Diet effective now                    Inpatient Medications  Scheduled Meds:  apixaban   5 mg Oral BID   aspirin  EC  81 mg Oral Daily   atorvastatin   40 mg Oral QHS   diltiazem   240 mg Oral Daily   empagliflozin   10 mg Oral Daily   hydrALAZINE   100 mg Oral TID   isosorbide  mononitrate  60 mg Oral Daily   Continuous Infusions:  ampicillin -sulbactam (UNASYN ) IV Stopped (08/16/24 0013)   PRN Meds:.acetaminophen  **OR** acetaminophen , albuterol , benzonatate , fentaNYL  (SUBLIMAZE ) injection, hydrALAZINE , HYDROcodone -acetaminophen   Antibiotics  :    Anti-infectives (From admission, onward)    Start     Dose/Rate Route Frequency Ordered Stop   08/16/24 2300  vancomycin  (VANCOCIN ) IVPB 1000 mg/200 mL premix  Status:  Discontinued        1,000 mg 200 mL/hr over 60 Minutes Intravenous Every 48 hours 08/15/24 0917 08/15/24 1025   08/15/24 2300  ceFEPIme  (MAXIPIME ) 2 g in sodium chloride  0.9 % 100 mL IVPB  Status:  Discontinued        2 g 200 mL/hr over 30 Minutes Intravenous Every 24 hours 08/15/24 0917 08/15/24 1025   08/15/24 1100  Ampicillin -Sulbactam (UNASYN ) 3 g  in sodium chloride  0.9 % 100 mL IVPB        3 g 200 mL/hr over 30 Minutes Intravenous Every 12 hours 08/15/24 1030     08/14/24 2130  vancomycin  (VANCOREADY) IVPB 2000 mg/400 mL        2,000 mg 200 mL/hr over 120 Minutes Intravenous  Once 08/14/24 2118 08/15/24 0147   08/14/24 2100  ceFEPIme  (MAXIPIME ) 2 g in sodium chloride  0.9 % 100 mL IVPB        2 g 200 mL/hr over 30 Minutes Intravenous  Once  08/14/24 2055 08/14/24 2334         Objective:   Vitals:   08/15/24 2000 08/16/24 0000 08/16/24 0400 08/16/24 0832  BP:  (!) 143/89 (!) 164/92 (!) 154/81  Pulse: 75 90 89 87  Resp: 18 20 17 12   Temp: 98.1 F (36.7 C) 98.1 F (36.7 C) 98.4 F (36.9 C) 98.1 F (36.7 C)  TempSrc: Oral Oral Oral Oral  SpO2: 93% 92% 94% 94%  Weight:      Height:        Wt Readings from Last 3 Encounters:  08/15/24 99.3 kg  08/07/24 100.4 kg  07/25/24 104.8 kg     Intake/Output Summary (Last 24 hours) at 08/16/2024 0855 Last data filed at 08/16/2024 0310 Gross per 24 hour  Intake 540 ml  Output 0 ml  Net 540 ml     Physical Exam  Awake Alert, No new F.N deficits, Normal affect Scissors.AT,PERRAL, L earlobe otitis externa improving, Supple Neck, No JVD,   Symmetrical Chest wall movement, Good air movement bilaterally, CTAB RRR,No Gallops,Rubs or new Murmurs,  +ve B.Sounds, Abd Soft, No tenderness,   No Cyanosis, Clubbing or edema       Data Review:    Recent Labs  Lab 08/14/24 2100 08/14/24 2106 08/16/24 0539  WBC 6.7  --  5.6  HGB 11.8* 12.9 10.5*  HCT 38.6 38.0 34.1*  PLT 348  --  339  MCV 86.7  --  85.5  MCH 26.5  --  26.3  MCHC 30.6  --  30.8  RDW 15.9*  --  16.1*  LYMPHSABS 1.4  --  0.6*  MONOABS 0.5  --  0.5  EOSABS 0.3  --  0.2  BASOSABS 0.1  --  0.0    Recent Labs  Lab 08/14/24 2100 08/14/24 2106 08/16/24 0539  NA 138 141 140  K 3.8 4.5 4.1  CL 108 111 108  CO2 17*  --  18*  ANIONGAP 13  --  14  GLUCOSE 175* 174* 120*  BUN 32* 46* 28*  CREATININE 2.03* 2.20* 1.95*  AST 16  --   --   ALT 14  --   --   ALKPHOS 87  --   --   BILITOT 0.9  --   --   ALBUMIN  2.7*  --   --   CRP 0.8  --  2.8*  PROCALCITON  --   --  <0.10  LATICACIDVEN  --  1.4  --   MG  --   --  1.9  CALCIUM  8.7*  --  8.7*      Recent Labs  Lab 08/14/24 2100 08/14/24 2106 08/16/24 0539  CRP 0.8  --  2.8*  PROCALCITON  --   --  <0.10  LATICACIDVEN  --  1.4  --   MG  --   --   1.9  CALCIUM  8.7*  --  8.7*  Lab Results  Component Value Date   CHOL 183 08/01/2024   HDL 47 08/01/2024   LDLCALC 121 (H) 08/01/2024   TRIG 77 08/01/2024   CHOLHDL 3.9 08/01/2024    Lab Results  Component Value Date   HGBA1C 6.5 (H) 08/01/2024    Micro Results Recent Results (from the past 240 hours)  Blood Culture (routine x 2)     Status: None (Preliminary result)   Collection Time: 08/14/24  8:43 PM   Specimen: BLOOD  Result Value Ref Range Status   Specimen Description BLOOD LEFT ANTECUBITAL  Final   Special Requests   Final    BOTTLES DRAWN AEROBIC AND ANAEROBIC Blood Culture adequate volume   Culture   Final    NO GROWTH < 12 HOURS Performed at [facility], [address], [place], [place] [address]    Report Status PENDING  Incomplete  Resp panel by RT-PCR (RSV, Flu A&B, Covid) Anterior Nasal Swab     Status: None   Collection Time: 08/14/24  8:43 PM   Specimen: Anterior Nasal Swab  Result Value Ref Range Status   SARS Coronavirus 2 by RT PCR NEGATIVE NEGATIVE Final   Influenza A by PCR NEGATIVE NEGATIVE Final   Influenza B by PCR NEGATIVE NEGATIVE Final    Comment: (NOTE) The Xpert Xpress SARS-CoV-2/FLU/RSV plus assay is intended as an aid in the diagnosis of influenza from Nasopharyngeal swab specimens and should not be used as a sole basis for treatment. Nasal washings and aspirates are unacceptable for Xpert Xpress SARS-CoV-2/FLU/RSV testing.  Fact Sheet for Patients: BloggerCourse.com  Fact Sheet for Healthcare Providers: SeriousBroker.it  This test is not yet approved or cleared by the [place]  FDA and has been authorized for detection and/or diagnosis of SARS-CoV-2 by FDA under an Emergency Use Authorization (EUA). This EUA will remain in effect (meaning this test can be used) for the duration of the COVID-19 declaration under Section 564(b)(1) of the Act, 21 U.S.C. section  360bbb-3(b)(1), unless the authorization is terminated or revoked.     Resp Syncytial Virus by PCR NEGATIVE NEGATIVE Final    Comment: (NOTE) Fact Sheet for Patients: BloggerCourse.com  Fact Sheet for Healthcare Providers: SeriousBroker.it  This test is not yet approved or cleared by the [place]  FDA and has been authorized for detection and/or diagnosis of SARS-CoV-2 by FDA under an Emergency Use Authorization (EUA). This EUA will remain in effect (meaning this test can be used) for the duration of the COVID-19 declaration under Section 564(b)(1) of the Act, 21 U.S.C. section 360bbb-3(b)(1), unless the authorization is terminated or revoked.  Performed at [facility], [address], [place], [place] [address]   Blood Culture (routine x 2)     Status: None (Preliminary result)   Collection Time: 08/14/24 10:30 PM   Specimen: BLOOD  Result Value Ref Range Status   Specimen Description BLOOD BLOOD LEFT HAND  Final   Special Requests   Final    BOTTLES DRAWN AEROBIC ONLY Blood Culture results may not be optimal due to an inadequate volume of blood received in culture bottles   Culture   Final    NO GROWTH < 12 HOURS Performed at [facility], [address] [place], [place] [address]    Report Status PENDING  Incomplete  Aerobic/Anaerobic Culture w Gram Stain (surgical/deep wound)     Status: None (Preliminary result)   Collection Time: 08/15/24 12:01 PM   Specimen: Ear, Left; Abscess  Result Value Ref Range Status   Specimen Description ABSCESS  Final   Special Requests EAR,LEFT  Final   Gram Stain   Final    NO WBC SEEN NO ORGANISMS SEEN Performed at [facility], [address], [place], [place] [address]    Culture PENDING  Incomplete   Report Status PENDING  Incomplete  MRSA Next Gen by PCR, Nasal     Status: None   Collection Time: 08/15/24  3:15 PM   Specimen: Nasal Mucosa; Nasal Swab   Result Value Ref Range Status   MRSA by PCR Next Gen NOT DETECTED NOT DETECTED Final    Comment: (NOTE) The GeneXpert MRSA Assay (FDA approved for NASAL specimens only), is one component of a comprehensive MRSA colonization surveillance program. It is not intended to diagnose MRSA infection nor to guide or monitor treatment for MRSA infections. Test performance is not FDA approved in patients less than 46 years old. Performed at [facility], [address] [place], [place] [address]     Radiology Report VAS US  LOWER EXTREMITY VENOUS (DVT) Result Date: 08/15/2024  Lower Venous DVT Study Patient Name:  MEYA CLUTTER  Date of Exam:   08/15/2024 Medical Rec #: [id number]     Accession #:    [id number] Date of Birth: Mar 21, 1946     Patient Gender: F Patient Age:   85 years Exam Location:  [facility] Procedure:      VAS US  LOWER EXTREMITY VENOUS (DVT) Referring Phys: MAXIMINO SHARPS --------------------------------------------------------------------------------  Indications: Pulmonary embolism.  Limitations: Body habitus and pt unable to tolerate compression. Performing Technologist: Elmarie Lindau, RVT  Examination Guidelines: A complete evaluation includes B-mode imaging, spectral Doppler, color Doppler, and power Doppler as needed of all accessible portions of each vessel. Bilateral testing is considered an integral part of a complete examination. Limited examinations for reoccurring indications may be performed as noted. The reflux portion of the exam is performed with the patient in reverse Trendelenburg.  +---------+---------------+---------+-----------+----------+-------------------+ RIGHT    CompressibilityPhasicitySpontaneityPropertiesThrombus Aging      +---------+---------------+---------+-----------+----------+-------------------+ CFV      Full           Yes      Yes                                       +---------+---------------+---------+-----------+----------+-------------------+ SFJ      Full                                                             +---------+---------------+---------+-----------+----------+-------------------+ FV Prox  Full                                                             +---------+---------------+---------+-----------+----------+-------------------+ FV Mid  full color flow     +---------+---------------+---------+-----------+----------+-------------------+ FV Distal                                             Not well visualized +---------+---------------+---------+-----------+----------+-------------------+ PFV      Full                                                             +---------+---------------+---------+-----------+----------+-------------------+ POP                                                   Not well visualized +---------+---------------+---------+-----------+----------+-------------------+ PTV      Full                                                             +---------+---------------+---------+-----------+----------+-------------------+ PERO     Full                                                             +---------+---------------+---------+-----------+----------+-------------------+   +---------+---------------+---------+-----------+----------+---------------+ LEFT     CompressibilityPhasicitySpontaneityPropertiesThrombus Aging  +---------+---------------+---------+-----------+----------+---------------+ CFV      Full           Yes      Yes                                  +---------+---------------+---------+-----------+----------+---------------+ SFJ      Full                                                         +---------+---------------+---------+-----------+----------+---------------+ FV Prox  Full                                                          +---------+---------------+---------+-----------+----------+---------------+ FV Mid                                                full color flow +---------+---------------+---------+-----------+----------+---------------+ FV Distal  full color flow +---------+---------------+---------+-----------+----------+---------------+ PFV      Full                                                         +---------+---------------+---------+-----------+----------+---------------+ POP      Full           Yes      Yes                                  +---------+---------------+---------+-----------+----------+---------------+ PTV      Full                                                         +---------+---------------+---------+-----------+----------+---------------+ PERO     Full                                                         +---------+---------------+---------+-----------+----------+---------------+     Summary: RIGHT: - There is no evidence of deep vein thrombosis in the lower extremity. However, portions of this examination were limited- see technologist comments above.  LEFT: - There is no evidence of deep vein thrombosis in the lower extremity.  - No cystic structure found in the popliteal fossa.  *See table(s) above for measurements and observations.    Preliminary    MR Brain W and Wo Contrast Result Date: 08/14/2024 CLINICAL DATA:  L facial droop EXAM: MRI HEAD WITHOUT AND WITH CONTRAST TECHNIQUE: Multiplanar, multiecho pulse sequences of the brain and surrounding structures were obtained without and with intravenous contrast. CONTRAST:  10mL GADAVIST  GADOBUTROL  1 MMOL/ML IV SOLN COMPARISON:  CT head 08/01/2024. FINDINGS: Brain: No acute infarction, acute hemorrhage, hydrocephalus, extra-axial collection or mass lesion. Numerous punctate foci of susceptibility artifact  predominantly in the cerebellum, thalami and basal ganglia and to lesser extent scattered throughout the supratentorial brain, compatible with chronic microhemorrhages. Moderate T2/FLAIR hyperintensities the white matter are compatible with chronic microvascular ischemic disease. No abnormal enhancement. Vascular: Normal flow voids. Skull and upper cervical spine: Normal marrow signal. Sinuses/Orbits: Opacified right maxillary sinus. Other: Left mastoid effusion. IMPRESSION: 1. No evidence of acute intracranial abnormality. 2. Moderate chronic microvascular ischemic disease. 3. Numerous chronic microhemorrhages, likely due to chronic hypertension. 4. Left mastoid effusion. 5. Opacified right maxillary sinus. Electronically Signed   By: Gilmore GORMAN Molt M.D.   On: 08/14/2024 22:16   DG Chest Port 1 View Result Date: 08/14/2024 CLINICAL DATA:  Sepsis, cough EXAM: PORTABLE CHEST 1 VIEW COMPARISON:  08/06/2024 FINDINGS: Single frontal view of the chest demonstrates stable enlargement of the cardiac silhouette. No acute airspace disease, effusion, or pneumothorax. No acute bony abnormalities. IMPRESSION: 1. Stable enlarged cardiac silhouette.  No acute airspace disease. Electronically Signed   By: Ozell Daring M.D.   On: 08/14/2024 21:13     Signature  -   Lavada Stank M.D on 08/16/2024 at 8:55 AM   -  To page go to [email or web address]

## 2024-08-17 ENCOUNTER — Inpatient Hospital Stay (HOSPITAL_COMMUNITY)

## 2024-08-17 DIAGNOSIS — G510 Bell's palsy: Secondary | ICD-10-CM | POA: Diagnosis not present

## 2024-08-17 LAB — BASIC METABOLIC PANEL WITH GFR
Anion gap: 12 (ref 5–15)
BUN: 26 mg/dL — ABNORMAL HIGH (ref 8–23)
CO2: 20 mmol/L — ABNORMAL LOW (ref 22–32)
Calcium: 8.3 mg/dL — ABNORMAL LOW (ref 8.9–10.3)
Chloride: 108 mmol/L (ref 98–111)
Creatinine, Ser: 1.93 mg/dL — ABNORMAL HIGH (ref 0.44–1.00)
GFR, Estimated: 26 mL/min — ABNORMAL LOW (ref >60)
Glucose, Bld: 128 mg/dL — ABNORMAL HIGH (ref 70–99)
Potassium: 4 mmol/L (ref 3.5–5.1)
Sodium: 140 mmol/L (ref 135–145)

## 2024-08-17 LAB — CBC WITH DIFFERENTIAL/PLATELET
Abs Immature Granulocytes: 0.02 K/uL (ref 0.00–0.07)
Basophils Absolute: 0.1 K/uL (ref 0.0–0.1)
Basophils Relative: 1 %
Eosinophils Absolute: 0.2 K/uL (ref 0.0–0.5)
Eosinophils Relative: 3 %
HCT: 32.8 % — ABNORMAL LOW (ref 36.0–46.0)
Hemoglobin: 10.2 g/dL — ABNORMAL LOW (ref 12.0–15.0)
Immature Granulocytes: 0 %
Lymphocytes Relative: 16 %
Lymphs Abs: 0.9 K/uL (ref 0.7–4.0)
MCH: 26.4 pg (ref 26.0–34.0)
MCHC: 31.1 g/dL (ref 30.0–36.0)
MCV: 84.8 fL (ref 80.0–100.0)
Monocytes Absolute: 0.7 K/uL (ref 0.1–1.0)
Monocytes Relative: 12 %
Neutro Abs: 4 K/uL (ref 1.7–7.7)
Neutrophils Relative %: 68 %
Platelets: 313 K/uL (ref 150–400)
RBC: 3.87 MIL/uL (ref 3.87–5.11)
RDW: 16.3 % — ABNORMAL HIGH (ref 11.5–15.5)
WBC: 5.9 K/uL (ref 4.0–10.5)
nRBC: 0 % (ref 0.0–0.2)

## 2024-08-17 LAB — MAGNESIUM: Magnesium: 1.9 mg/dL (ref 1.7–2.4)

## 2024-08-17 LAB — C-REACTIVE PROTEIN: CRP: 7.4 mg/dL — ABNORMAL HIGH (ref <1.0)

## 2024-08-17 LAB — PROCALCITONIN: Procalcitonin: <0.1 ng/mL

## 2024-08-17 NOTE — Plan of Care (Signed)
   Problem: Education: Goal: Knowledge of General Education information will improve Description: Including pain rating scale, medication(s)/side effects and non-pharmacologic comfort measures Outcome: Progressing   Problem: Clinical Measurements: Goal: Ability to maintain clinical measurements within normal limits will improve Outcome: Progressing

## 2024-08-17 NOTE — Plan of Care (Signed)

## 2024-08-17 NOTE — Progress Notes (Signed)
 PROGRESS NOTE                                                                                                                                                                                                             Patient Demographics:    Beth [name], is a 78 y.o. female, DOB - 22-Sep-1946, FMW:[id number]  Outpatient Primary MD for the patient is Zollie Lowers, MD    LOS - 2  Admit date - 08/14/2024    Chief Complaint  Patient presents with   Facial Droop       Brief Narrative (HPI from H&P)    78 y.o. female with medical history significant of hypertension, hyperlipidemia, diastolic CHF, CVA, diabetes mellitus type 2, and chronic kidney disease presents with left ear pain and facial drooping.   She has persistent left ear pain that has not improved despite treatment with IV antibiotics during her recent hospital stay. The pain is severe and ongoing since her last visit. She has been receiving Tylenol  for pain management at the rehab center, which provides some relief. Last night, she received an unspecified pain medication that was effective. She has been on ciprofloxacin  and steroids for her ear, along with five different ear drops over the past four weeks. No headaches, but the pain is localized to the left side of her face and ear.   She reports facial drooping on the left side, which began two days ago. Her children have noticed a difference in her facial appearance, and she experiences blurred vision on the left side. No difficulty with speech is noted.  He was diagnosed with left otitis media, MRI of the brain was acute ENT was consulted and she was admitted.   Subjective:   Patient in bed, appears comfortable, denies any headache, +ve L ear pain, no fever, no chest pain or pressure, no shortness of breath , no abdominal pain. No new focal weakness.    Assessment  & Plan :   Facial droop with left otitis media with  some compression on left facial nerve with facial nerve weakness.  Negative MRI brain, seen by ENT, underwent left myringotomy and tube placement by Dr. Roark on 08/15/2024, on antibiotics and improving.  Continue to monitor.  She is having slightly more pain on 08/17/2024, case discussed with Dr. Roark who suggested that there is  nothing else to be done except CT scan of the temporal bone with contrast, unfortunately due to renal insufficiency it will be done noncontrast.  Will order.   Hypertensive urgency On admission blood pressures elevated up to 199/98. - Continue hydralazine , Cardizem , and isosorbide  mononitrate   History of pulmonary embolism Continue Eliquis  no acute issues, lower extremity venous duplex nonacute.   Heart failure with preserved EF Echocardiogram from 8/25 noted EF to be 60 to 65% with grade 1 diastolic dysfunction, normal RV, and no significant valvular disease present. - Strict I&O's and daily weights    Chronic kidney disease stage IV Creatinine noted to be 2.03 with BUN 32 which appears improved from prior hospitalization her creatinine was elevated up to 3.5.   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  - Continue PT/OT, lower extremity venous duplex nonacute.    Controlled diabetes mellitus type 2, without long-term use of insulin  Last hemoglobin A1c noted to be 6.5 on 8/25. -Heart healthy and carb modified diet - Continue Jardiance     Lab Results  Component Value Date   HGBA1C 6.5 (H) 08/01/2024   CBG (last 3)  Recent Labs    08/15/24 1109 08/15/24 1224  GLUCAP 108* 119*          Condition -  Guarded  Family Communication  :  None  Code Status :  Full  Consults  :  ENT, discussed with neurologist Dr. Glenwood he is well on 08/17/2024  PUD Prophylaxis :     Procedures  :     Left ear surgery by Dr. Roark on 08/15/2024.    Preop/postop diagnosis: Left otitis media Procedure: Left myringotomy and tube with culture  Venous ultrasound.  No DVT.    CT head. 1. No evidence of acute intracranial abnormality. 2. Moderate chronic microvascular ischemic disease. 3. Numerous chronic microhemorrhages, likely due to chronic hypertension. 4. Left mastoid effusion. 5. Opacified right maxillary sinus.       Disposition Plan  :    Status is: Inpatient   DVT Prophylaxis  :     apixaban  (ELIQUIS ) tablet 5 mg     Lab Results  Component Value Date   PLT 313 08/17/2024    Diet :  Diet Order             Diet heart healthy/carb modified Room service appropriate? Yes; Fluid consistency: Thin  Diet effective now                    Inpatient Medications  Scheduled Meds:  apixaban   5 mg Oral BID   aspirin  EC  81 mg Oral Daily   atorvastatin   40 mg Oral QHS   diltiazem   240 mg Oral Daily   empagliflozin   10 mg Oral Daily   hydrALAZINE   100 mg Oral TID   isosorbide  mononitrate  60 mg Oral Daily   Continuous Infusions:  ampicillin -sulbactam (UNASYN ) IV 3 g (08/16/24 2357)   PRN Meds:.acetaminophen  **OR** acetaminophen , albuterol , benzonatate , fentaNYL  (SUBLIMAZE ) injection, hydrALAZINE , HYDROcodone -acetaminophen      Objective:   Vitals:   08/16/24 2007 08/17/24 0004 08/17/24 0800 08/17/24 0924  BP: (!) 178/87 (!) 153/81 (!) 148/98 (!) 148/98  Pulse: 73 82 76   Resp: 19 (!) 21 (!) 23   Temp: 98.6 F (37 C) 98.1 F (36.7 C) 99.3 F (37.4  C)   TempSrc: Oral Oral Oral   SpO2: 96% 96% 93%   Weight:      Height:        Wt Readings from Last 3 Encounters:  08/15/24 99.3 kg  08/07/24 100.4 kg  07/25/24 104.8 kg     Intake/Output Summary (Last 24 hours) at 08/17/2024 0939 Last data filed at 08/17/2024 0003 Gross per 24 hour  Intake 240 ml  Output 850 ml  Net -610 ml     Physical Exam  Awake Alert, No new F.N deficits,  left facial droop, left forehead and eyelid weakness as well, Cross.AT,PERRAL, L earlobe otitis externa improving, Supple Neck, No JVD,   Symmetrical Chest wall movement, Good air movement bilaterally, CTAB RRR,No Gallops,Rubs or new Murmurs,  +ve B.Sounds, Abd Soft, No tenderness,   No Cyanosis, Clubbing or edema       Data Review:    Recent Labs  Lab 08/14/24 2100 08/14/24 2106 08/16/24 0539 08/17/24 0403  WBC 6.7  --  5.6 5.9  HGB 11.8* 12.9 10.5* 10.2*  HCT 38.6 38.0 34.1* 32.8*  PLT 348  --  339 313  MCV 86.7  --  85.5 84.8  MCH 26.5  --  26.3 26.4  MCHC 30.6  --  30.8 31.1  RDW 15.9*  --  16.1* 16.3*  LYMPHSABS 1.4  --  0.6* 0.9  MONOABS 0.5  --  0.5 0.7  EOSABS 0.3  --  0.2 0.2  BASOSABS 0.1  --  0.0 0.1    Recent Labs  Lab 08/14/24 2100 08/14/24 2106 08/16/24 0539 08/17/24 0403  NA 138 141 140 140  K 3.8 4.5 4.1 4.0  CL 108 111 108 108  CO2 17*  --  18* 20*  ANIONGAP 13  --  14 12  GLUCOSE 175* 174* 120* 128*  BUN 32* 46* 28* 26*  CREATININE 2.03* 2.20* 1.95* 1.93*  AST 16  --   --   --   ALT 14  --   --   --   ALKPHOS 87  --   --   --   BILITOT 0.9  --   --   --   ALBUMIN  2.7*  --   --   --   CRP 0.8  --  2.8* 7.4*  PROCALCITON  --   --  <0.10 <0.10  LATICACIDVEN  --  1.4  --   --   MG  --   --  1.9 1.9  CALCIUM  8.7*  --  8.7* 8.3*      Recent Labs  Lab 08/14/24 2100 08/14/24 2106 08/16/24 0539 08/17/24 0403  CRP 0.8  --  2.8* 7.4*  PROCALCITON  --   --  <0.10 <0.10  LATICACIDVEN  --  1.4  --   --   MG  --   --  1.9 1.9  CALCIUM  8.7*  --  8.7* 8.3*    Lab Results  Component Value Date   CHOL 183 08/01/2024   HDL 47 08/01/2024   LDLCALC 121 (H) 08/01/2024   TRIG 77 08/01/2024   CHOLHDL 3.9 08/01/2024    Lab Results  Component Value Date   HGBA1C 6.5 (H) 08/01/2024    Micro Results Recent Results (from the past 240 hours)  Blood Culture (routine x 2)     Status: None (Preliminary result)   Collection Time: 08/14/24  8:43 PM    Specimen: BLOOD  Result Value Ref Range Status   Specimen Description BLOOD  LEFT ANTECUBITAL  Final   Special Requests   Final    BOTTLES DRAWN AEROBIC AND ANAEROBIC Blood Culture adequate volume   Culture   Final    NO GROWTH 3 DAYS Performed at [facility], [address], [place], [place] [address]    Report Status PENDING  Incomplete  Resp panel by RT-PCR (RSV, Flu A&B, Covid) Anterior Nasal Swab     Status: None   Collection Time: 08/14/24  8:43 PM   Specimen: Anterior Nasal Swab  Result Value Ref Range Status   SARS Coronavirus 2 by RT PCR NEGATIVE NEGATIVE Final   Influenza A by PCR NEGATIVE NEGATIVE Final   Influenza B by PCR NEGATIVE NEGATIVE Final    Comment: (NOTE) The Xpert Xpress SARS-CoV-2/FLU/RSV plus assay is intended as an aid in the diagnosis of influenza from Nasopharyngeal swab specimens and should not be used as a sole basis for treatment. Nasal washings and aspirates are unacceptable for Xpert Xpress SARS-CoV-2/FLU/RSV testing.  Fact Sheet for Patients: BloggerCourse.com  Fact Sheet for Healthcare Providers: SeriousBroker.it  This test is not yet approved or cleared by the [place]  FDA and has been authorized for detection and/or diagnosis of SARS-CoV-2 by FDA under an Emergency Use Authorization (EUA). This EUA will remain in effect (meaning this test can be used) for the duration of the COVID-19 declaration under Section 564(b)(1) of the Act, 21 U.S.C. section 360bbb-3(b)(1), unless the authorization is terminated or revoked.     Resp Syncytial Virus by PCR NEGATIVE NEGATIVE Final    Comment: (NOTE) Fact Sheet for Patients: BloggerCourse.com  Fact Sheet for Healthcare Providers: SeriousBroker.it  This test is not yet approved or cleared by the [place]  FDA and has been authorized for detection and/or diagnosis of SARS-CoV-2  by FDA under an Emergency Use Authorization (EUA). This EUA will remain in effect (meaning this test can be used) for the duration of the COVID-19 declaration under Section 564(b)(1) of the Act, 21 U.S.C. section 360bbb-3(b)(1), unless the authorization is terminated or revoked.  Performed at [facility], [address], [place], [place] [address]   Blood Culture (routine x 2)     Status: None (Preliminary result)   Collection Time: 08/14/24 10:30 PM   Specimen: BLOOD  Result Value Ref Range Status   Specimen Description BLOOD BLOOD LEFT HAND  Final   Special Requests   Final    BOTTLES DRAWN AEROBIC ONLY Blood Culture results may not be optimal due to an inadequate volume of blood received in culture bottles   Culture   Final    NO GROWTH 3 DAYS Performed at [facility], [address] [place], [place] [address]    Report Status PENDING  Incomplete  Aerobic/Anaerobic Culture w Gram Stain (surgical/deep wound)     Status: None (Preliminary result)   Collection Time: 08/15/24 12:01 PM   Specimen: Ear, Left; Abscess  Result Value Ref Range Status   Specimen Description ABSCESS  Final   Special Requests EAR,LEFT  Final   Gram Stain NO WBC SEEN NO ORGANISMS SEEN   Final   Culture   Final    NO GROWTH < 24 HOURS Performed at [facility], [address], [place], [place] [address]    Report Status PENDING  Incomplete  MRSA Next Gen by PCR, Nasal     Status: None   Collection Time: 08/15/24  3:15 PM   Specimen: Nasal Mucosa; Nasal Swab  Result Value  Ref Range Status   MRSA by PCR Next Gen NOT DETECTED NOT DETECTED Final    Comment: (NOTE) The GeneXpert MRSA Assay (FDA approved for NASAL specimens only), is one component of a comprehensive MRSA colonization surveillance program. It is not intended to diagnose MRSA infection nor to guide or monitor treatment for MRSA infections. Test performance is not FDA approved in patients less than 3  years old. Performed at [facility], [address], [place], [place] [address]     Radiology Report VAS US  LOWER EXTREMITY VENOUS (DVT) Result Date: 08/16/2024  Lower Venous DVT Study Patient Name:  URVI IMES  Date of Exam:   08/15/2024 Medical Rec #: [id number]     Accession #:    [id number] Date of Birth: 09-20-1946     Patient Gender: F Patient Age:   25 years Exam Location:  [facility] Procedure:      VAS US  LOWER EXTREMITY VENOUS (DVT) Referring Phys: MAXIMINO SHARPS --------------------------------------------------------------------------------  Indications: Pulmonary embolism.  Limitations: Body habitus and pt unable to tolerate compression. Performing Technologist: Elmarie Lindau, RVT  Examination Guidelines: A complete evaluation includes B-mode imaging, spectral Doppler, color Doppler, and power Doppler as needed of all accessible portions of each vessel. Bilateral testing is considered an integral part of a complete examination. Limited examinations for reoccurring indications may be performed as noted. The reflux portion of the exam is performed with the patient in reverse Trendelenburg.  +---------+---------------+---------+-----------+----------+-------------------+ RIGHT    CompressibilityPhasicitySpontaneityPropertiesThrombus Aging      +---------+---------------+---------+-----------+----------+-------------------+ CFV      Full           Yes      Yes                                      +---------+---------------+---------+-----------+----------+-------------------+ SFJ      Full                                                             +---------+---------------+---------+-----------+----------+-------------------+ FV Prox  Full                                                             +---------+---------------+---------+-----------+----------+-------------------+ FV Mid                                                full color flow      +---------+---------------+---------+-----------+----------+-------------------+ FV Distal                                             Not well visualized +---------+---------------+---------+-----------+----------+-------------------+ PFV      Full                                                             +---------+---------------+---------+-----------+----------+-------------------+  POP                                                   Not well visualized +---------+---------------+---------+-----------+----------+-------------------+ PTV      Full                                                             +---------+---------------+---------+-----------+----------+-------------------+ PERO     Full                                                             +---------+---------------+---------+-----------+----------+-------------------+   +---------+---------------+---------+-----------+----------+---------------+ LEFT     CompressibilityPhasicitySpontaneityPropertiesThrombus Aging  +---------+---------------+---------+-----------+----------+---------------+ CFV      Full           Yes      Yes                                  +---------+---------------+---------+-----------+----------+---------------+ SFJ      Full                                                         +---------+---------------+---------+-----------+----------+---------------+ FV Prox  Full                                                         +---------+---------------+---------+-----------+----------+---------------+ FV Mid                                                full color flow +---------+---------------+---------+-----------+----------+---------------+ FV Distal                                             full color flow +---------+---------------+---------+-----------+----------+---------------+ PFV      Full                                                          +---------+---------------+---------+-----------+----------+---------------+ POP      Full           Yes      Yes                                  +---------+---------------+---------+-----------+----------+---------------+  PTV      Full                                                         +---------+---------------+---------+-----------+----------+---------------+ PERO     Full                                                         +---------+---------------+---------+-----------+----------+---------------+     Summary: RIGHT: - There is no evidence of deep vein thrombosis in the lower extremity. However, portions of this examination were limited- see technologist comments above.  LEFT: - There is no evidence of deep vein thrombosis in the lower extremity.  - No cystic structure found in the popliteal fossa.  *See table(s) above for measurements and observations. Electronically signed by Penne Colorado MD on 08/16/2024 at 3:35:39 PM.    Final      Signature  -   Lavada Stank M.D on 08/17/2024 at 9:39 AM   -  To page go to [email or web address]

## 2024-08-18 DIAGNOSIS — G510 Bell's palsy: Secondary | ICD-10-CM | POA: Diagnosis not present

## 2024-08-18 LAB — BASIC METABOLIC PANEL WITH GFR
Anion gap: 13 (ref 5–15)
BUN: 26 mg/dL — ABNORMAL HIGH (ref 8–23)
CO2: 18 mmol/L — ABNORMAL LOW (ref 22–32)
Calcium: 8 mg/dL — ABNORMAL LOW (ref 8.9–10.3)
Chloride: 108 mmol/L (ref 98–111)
Creatinine, Ser: 2.01 mg/dL — ABNORMAL HIGH (ref 0.44–1.00)
GFR, Estimated: 25 mL/min — ABNORMAL LOW (ref >60)
Glucose, Bld: 114 mg/dL — ABNORMAL HIGH (ref 70–99)
Potassium: 4.1 mmol/L (ref 3.5–5.1)
Sodium: 139 mmol/L (ref 135–145)

## 2024-08-18 LAB — CBC WITH DIFFERENTIAL/PLATELET
Abs Immature Granulocytes: 0.02 K/uL (ref 0.00–0.07)
Basophils Absolute: 0 K/uL (ref 0.0–0.1)
Basophils Relative: 1 %
Eosinophils Absolute: 0.2 K/uL (ref 0.0–0.5)
Eosinophils Relative: 3 %
HCT: 30.6 % — ABNORMAL LOW (ref 36.0–46.0)
Hemoglobin: 9.6 g/dL — ABNORMAL LOW (ref 12.0–15.0)
Immature Granulocytes: 0 %
Lymphocytes Relative: 19 %
Lymphs Abs: 1.1 K/uL (ref 0.7–4.0)
MCH: 27 pg (ref 26.0–34.0)
MCHC: 31.4 g/dL (ref 30.0–36.0)
MCV: 86 fL (ref 80.0–100.0)
Monocytes Absolute: 0.9 K/uL (ref 0.1–1.0)
Monocytes Relative: 15 %
Neutro Abs: 3.8 K/uL (ref 1.7–7.7)
Neutrophils Relative %: 62 %
Platelets: 297 K/uL (ref 150–400)
RBC: 3.56 MIL/uL — ABNORMAL LOW (ref 3.87–5.11)
RDW: 16.4 % — ABNORMAL HIGH (ref 11.5–15.5)
WBC: 6.1 K/uL (ref 4.0–10.5)
nRBC: 0 % (ref 0.0–0.2)

## 2024-08-18 LAB — PROCALCITONIN: Procalcitonin: <0.1 ng/mL

## 2024-08-18 LAB — C-REACTIVE PROTEIN: CRP: 12.4 mg/dL — ABNORMAL HIGH (ref <1.0)

## 2024-08-18 LAB — MAGNESIUM: Magnesium: 1.9 mg/dL (ref 1.7–2.4)

## 2024-08-18 MED ORDER — HYDROCORTISONE 1 % EX CREA
TOPICAL_CREAM | Freq: Two times a day (BID) | CUTANEOUS | Status: DC
  Filled 2024-08-18: qty 28

## 2024-08-18 MED ORDER — HYDROCOD POLI-CHLORPHE POLI ER 10-8 MG/5ML PO SUER
5.0000 mL | Freq: Two times a day (BID) | ORAL | Status: DC | PRN
Start: 2024-08-18 — End: 2024-08-19
  Administered 2024-08-18: 5 mL via ORAL
  Filled 2024-08-18: qty 5

## 2024-08-18 MED ORDER — VALACYCLOVIR HCL 500 MG PO TABS
500.0000 mg | ORAL_TABLET | Freq: Two times a day (BID) | ORAL | Status: DC
  Administered 2024-08-18 – 2024-08-19 (×2): 500 mg via ORAL
  Filled 2024-08-18 (×3): qty 1

## 2024-08-18 MED ORDER — CIPROFLOXACIN-DEXAMETHASONE 0.3-0.1 % OT SUSP
4.0000 [drp] | Freq: Two times a day (BID) | OTIC | Status: DC
  Administered 2024-08-18 – 2024-08-19 (×3): 4 [drp] via OTIC
  Filled 2024-08-18: qty 7.5

## 2024-08-18 MED ORDER — VALGANCICLOVIR HCL 450 MG PO TABS
450.0000 mg | ORAL_TABLET | Freq: Every day | ORAL | Status: DC
  Administered 2024-08-18: 450 mg via ORAL
  Filled 2024-08-18 (×2): qty 1

## 2024-08-18 NOTE — Plan of Care (Signed)

## 2024-08-18 NOTE — Progress Notes (Signed)
 Physical Therapy Treatment Patient Details Name: Beth [name] MRN: [id number] DOB: Apr 10, 1946 Today's Date: 08/18/2024   History of Present Illness Pt is a 78 y.o. F who presents with left ear pain and facial drooping. Negative MRI brain. Seen by ENT, underwent left myringotomy and tube placement on 08/15/2024, and on antibiotics. PMhx: HTN, HLD, dCHF, CVA, T2DM, CKD, Rt TKA with infection and replacement, PE.    PT Comments  Pt tolerated treatment well today. Pt today was able to transfer from bed to chair via stand pivot however attempted to sit prematurely requiring +2 Mod A to correct. No change in DC/DME recs at this time. PT will continue to follow.    If plan is discharge home, recommend the following: A lot of help with walking and/or transfers;A lot of help with bathing/dressing/bathroom;Assistance with feeding;Assist for transportation;Direct supervision/assist for financial management;Assistance with cooking/housework;Direct supervision/assist for medications management;Supervision due to cognitive status   Can travel by private vehicle     No  Equipment Recommendations  Other (comment)    Recommendations for Other Services       Precautions / Restrictions Precautions Precautions: Fall Recall of Precautions/Restrictions: Impaired Precaution/Restrictions Comments: Rt knee spacer Required Braces or Orthoses: Other Brace Other Brace: R knee brace for mobility Restrictions Weight Bearing Restrictions Per Provider Order: No     Mobility  Bed Mobility Overal bed mobility: Needs Assistance Bed Mobility: Supine to Sit     Supine to sit: Mod assist, +2 for physical assistance, +2 for safety/equipment     General bed mobility comments: Assistance with RLE management and trunk elevation.    Transfers Overall transfer level: Needs assistance Equipment used: Rolling walker (2 wheels) Transfers: Sit to/from Stand, Bed to chair/wheelchair/BSC Sit to Stand: Min assist, +2  physical assistance Stand pivot transfers: +2 physical assistance, Mod assist, +2 safety/equipment         General transfer comment: min A x2 for STS and stand pivot, difficulty lifting RLE or scooting it along floor to pivot, does not control sitting to lower surfaces well. Pt noted to sit prematurely requiring +2 Mod A to assist into chair.    Ambulation/Gait               General Gait Details: unable   Stairs             Wheelchair Mobility     Tilt Bed    Modified Rankin (Stroke Patients Only)       Balance Overall balance assessment: Needs assistance Sitting-balance support: No upper extremity supported, Feet supported Sitting balance-[name]: Fair Sitting balance - Comments: CGA to min assist occasional posterior lean   Standing balance support: Bilateral upper extremity supported, During functional activity, Reliant on assistive device for balance Standing balance-[name]: Poor Standing balance comment: reliant on RW for support                            Communication Communication Communication: No apparent difficulties  Cognition Arousal: Alert Behavior During Therapy: WFL for tasks assessed/performed   PT - Cognitive impairments: No apparent impairments                         Following commands: Intact Following commands impaired: Follows one step commands with increased time    Cueing Cueing Techniques: Verbal cues  Exercises      General Comments General comments (skin integrity, edema, etc.): VSS  Pertinent Vitals/Pain Pain Assessment Pain Assessment: Faces Faces Pain Scale: Hurts a little bit Pain Location: R Knee with movement Pain Descriptors / Indicators: Aching, Discomfort Pain Intervention(s): Monitored during session    Home Living                          Prior Function            PT Goals (current goals can now be found in the care plan section) Progress towards PT  goals: Progressing toward goals    Frequency    Min 2X/week      PT Plan      Co-evaluation              AM-PAC PT 6 Clicks Mobility   Outcome Measure  Help needed turning from your back to your side while in a flat bed without using bedrails?: A Little Help needed moving from lying on your back to sitting on the side of a flat bed without using bedrails?: A Little Help needed moving to and from a bed to a chair (including a wheelchair)?: A Little Help needed standing up from a chair using your arms (e.g., wheelchair or bedside chair)?: A Little Help needed to walk in hospital room?: Total Help needed climbing 3-5 steps with a railing? : Total 6 Click Score: 14    End of Session Equipment Utilized During Treatment: Gait belt Activity Tolerance: Patient tolerated treatment well Patient left: in chair;with call bell/phone within reach;with chair alarm set Nurse Communication: Need for lift equipment;Mobility status (Use of stedy to return to bed) PT Visit Diagnosis: Other abnormalities of gait and mobility (R26.89);Difficulty in walking, not elsewhere classified (R26.2);Muscle weakness (generalized) (M62.81)     Time: 8657-8647 PT Time Calculation (min) (ACUTE ONLY): 10 min  Charges:    $Therapeutic Activity: 8-22 mins PT General Charges $$ ACUTE PT VISIT: 1 Visit                     Beth [name], PT, DPT Acute Rehab Services [phone]    Beth [name] 08/18/2024, 3:34 PM

## 2024-08-18 NOTE — Progress Notes (Signed)
 PROGRESS NOTE                                                                                                                                                                                                             Patient Demographics:    Beth [name], is a 78 y.o. female, DOB - [birth year], FMW:[id number]  Outpatient Primary MD for the patient is Zollie Lowers, MD    LOS - 3  Admit date - 08/14/2024    Chief Complaint  Patient presents with   Facial Droop       Brief Narrative (HPI from H&P)    78 y.o. female with medical history significant of hypertension, hyperlipidemia, diastolic CHF, CVA, diabetes mellitus type 2, and chronic kidney disease presents with left ear pain and facial drooping.   She has persistent left ear pain that has not improved despite treatment with IV antibiotics during her recent hospital stay. The pain is severe and ongoing since her last visit. She has been receiving Tylenol  for pain management at the rehab center, which provides some relief. Last night, she received an unspecified pain medication that was effective. She has been on ciprofloxacin  and steroids for her ear, along with five different ear drops over the past four weeks. No headaches, but the pain is localized to the left side of her face and ear.   She reports facial drooping on the left side, which began two days ago. Her children have noticed a difference in her facial appearance, and she experiences blurred vision on the left side. No difficulty with speech is noted.  He was diagnosed with left otitis media, MRI of the brain was acute ENT was consulted and she was admitted.   Subjective:   Patient in bed, appears comfortable, denies any headache, no fever, no chest pain or pressure, no shortness of breath , no abdominal pain. No focal weakness.  Left-sided facial discomfort and weakness improved.  Left ear ache better.   Assessment   & Plan :   Facial droop with left otitis media with some compression on left facial nerve with facial nerve weakness, recently had left ear herpes breakout as well few weeks.  Negative MRI brain, seen by ENT, underwent left myringotomy and tube placement by Dr. Roark on 08/15/2024, on antibiotics and improving.  Continue to monitor.  She is  having slightly more pain on 08/17/2024, case discussed with Dr. Roark loading her CT of the temporal bone on 08/18/2024.  Continue antibiotics finish course, add valacyclovir  PO, steroid ear cream, outpatient ENT follow-up, clinically continues to improve.   Hypertensive urgency On admission blood pressures elevated up to 199/98. - Continue hydralazine , Cardizem , and isosorbide  mononitrate   History of pulmonary embolism Continue Eliquis  no acute issues, lower extremity venous duplex nonacute.   Heart failure with preserved EF Echocardiogram from 8/25 noted EF to be 60 to 65% with grade 1 diastolic dysfunction, normal RV, and no significant valvular disease present. - Strict I&O's and daily weights    Chronic kidney disease stage IV Creatinine noted to be 2.03 with BUN 32 which appears improved from prior hospitalization her creatinine was elevated up to 3.5.   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  - Continue PT/OT, lower extremity venous duplex nonacute.   Controlled diabetes mellitus type 2, without long-term use of insulin  Last hemoglobin A1c noted to be 6.5 on 8/25. -Heart healthy and carb modified diet - Continue Jardiance     Lab Results  Component Value Date   HGBA1C 6.5 (H) 08/01/2024   CBG (last 3)  Recent Labs    08/15/24 1109 08/15/24 1224  GLUCAP 108* 119*          Condition -  Guarded  Family Communication  :  None  Code Status :   Full  Consults  :  ENT, discussed with neurologist Dr. Glenwood he is well on 08/17/2024  PUD Prophylaxis :    Procedures  :     Left ear surgery by Dr. Roark on 08/15/2024.    Preop/postop diagnosis: Left otitis media Procedure: Left myringotomy and tube with culture  Venous ultrasound.  No DVT.    CT head. 1. No evidence of acute intracranial abnormality. 2. Moderate chronic microvascular ischemic disease. 3. Numerous chronic microhemorrhages, likely due to chronic hypertension. 4. Left mastoid effusion. 5. Opacified right maxillary sinus.       Disposition Plan  :    Status is: Inpatient   DVT Prophylaxis  :     apixaban  (ELIQUIS ) tablet 5 mg     Lab Results  Component Value Date   PLT 297 08/18/2024    Diet :  Diet Order             Diet heart healthy/carb modified Room service appropriate? Yes; Fluid consistency: Thin  Diet effective now                    Inpatient Medications  Scheduled Meds:  apixaban   5 mg Oral BID   aspirin  EC  81 mg Oral Daily   atorvastatin   40 mg Oral QHS   ciprofloxacin -dexamethasone   4 drop Left EAR BID   diltiazem   240 mg Oral Daily   empagliflozin   10 mg Oral Daily   hydrALAZINE   100 mg Oral TID   hydrocortisone  cream   Topical BID   isosorbide  mononitrate  60 mg Oral Daily   valGANciclovir   450 mg Oral Daily   Continuous Infusions:  ampicillin -sulbactam (UNASYN ) IV Stopped (08/18/24 0806)   PRN Meds:.acetaminophen  **OR** acetaminophen , albuterol , benzonatate , fentaNYL  (SUBLIMAZE ) injection, hydrALAZINE , HYDROcodone -acetaminophen      Objective:   Vitals:   08/17/24 1646 08/17/24 2000 08/18/24 0400 08/18/24 0817  BP: ROLLEN)  158/74 (!) 118/91 139/74 137/76  Pulse:  85 76 86  Resp:   (!) 22 (!) 22  Temp:  98.7 F (37.1 C) 98.3 F (36.8 C) 99 F (37.2 C)  TempSrc:  Oral Oral Oral  SpO2:  96% 93%   Weight:      Height:        Wt Readings from Last 3 Encounters:  08/15/24 99.3 kg  08/07/24 100.4 kg  07/25/24  104.8 kg     Intake/Output Summary (Last 24 hours) at 08/18/2024 1008 Last data filed at 08/17/2024 1957 Gross per 24 hour  Intake 240 ml  Output --  Net 240 ml     Physical Exam  Awake Alert, No new F.N deficits, left facial droop, left forehead and eyelid weakness as well, [place].AT,PERRAL, L earlobe otitis externa improving, Supple Neck, No JVD,   Symmetrical Chest wall movement, Good air movement bilaterally, CTAB RRR,No Gallops,Rubs or new Murmurs,  +ve B.Sounds, Abd Soft, No tenderness,   No Cyanosis, Clubbing or edema       Data Review:    Recent Labs  Lab 08/14/24 2100 08/14/24 2106 08/16/24 0539 08/17/24 0403 08/18/24 0234  WBC 6.7  --  5.6 5.9 6.1  HGB 11.8* 12.9 10.5* 10.2* 9.6*  HCT 38.6 38.0 34.1* 32.8* 30.6*  PLT 348  --  339 313 297  MCV 86.7  --  85.5 84.8 86.0  MCH 26.5  --  26.3 26.4 27.0  MCHC 30.6  --  30.8 31.1 31.4  RDW 15.9*  --  16.1* 16.3* 16.4*  LYMPHSABS 1.4  --  0.6* 0.9 1.1  MONOABS 0.5  --  0.5 0.7 0.9  EOSABS 0.3  --  0.2 0.2 0.2  BASOSABS 0.1  --  0.0 0.1 0.0    Recent Labs  Lab 08/14/24 2100 08/14/24 2106 08/16/24 0539 08/17/24 0403 08/18/24 0234  NA 138 141 140 140 139  K 3.8 4.5 4.1 4.0 4.1  CL 108 111 108 108 108  CO2 17*  --  18* 20* 18*  ANIONGAP 13  --  14 12 13   GLUCOSE 175* 174* 120* 128* 114*  BUN 32* 46* 28* 26* 26*  CREATININE 2.03* 2.20* 1.95* 1.93* 2.01*  AST 16  --   --   --   --   ALT 14  --   --   --   --   ALKPHOS 87  --   --   --   --   BILITOT 0.9  --   --   --   --   ALBUMIN  2.7*  --   --   --   --   CRP 0.8  --  2.8* 7.4* 12.4*  PROCALCITON  --   --  <0.10 <0.10 <0.10  LATICACIDVEN  --  1.4  --   --   --   MG  --   --  1.9 1.9 1.9  CALCIUM  8.7*  --  8.7* 8.3* 8.0*      Recent Labs  Lab 08/14/24 2100 08/14/24 2106 08/16/24 0539 08/17/24 0403 08/18/24 0234  CRP 0.8  --  2.8* 7.4* 12.4*  PROCALCITON  --   --  <0.10 <0.10 <0.10  LATICACIDVEN  --  1.4  --   --   --   MG  --   --  1.9 1.9  1.9  CALCIUM  8.7*  --  8.7* 8.3* 8.0*    Lab Results  Component Value Date   CHOL 183  08/01/2024   HDL 47 08/01/2024   LDLCALC 121 (H) 08/01/2024   TRIG 77 08/01/2024   CHOLHDL 3.9 08/01/2024    Lab Results  Component Value Date   HGBA1C 6.5 (H) 08/01/2024    Micro Results Recent Results (from the past 240 hours)  Blood Culture (routine x 2)     Status: None (Preliminary result)   Collection Time: 08/14/24  8:43 PM   Specimen: BLOOD  Result Value Ref Range Status   Specimen Description BLOOD LEFT ANTECUBITAL  Final   Special Requests   Final    BOTTLES DRAWN AEROBIC AND ANAEROBIC Blood Culture adequate volume   Culture   Final    NO GROWTH 4 DAYS Performed at [facility], [address], [place], [place] [address]    Report Status PENDING  Incomplete  Resp panel by RT-PCR (RSV, Flu A&B, Covid) Anterior Nasal Swab     Status: None   Collection Time: 08/14/24  8:43 PM   Specimen: Anterior Nasal Swab  Result Value Ref Range Status   SARS Coronavirus 2 by RT PCR NEGATIVE NEGATIVE Final   Influenza A by PCR NEGATIVE NEGATIVE Final   Influenza B by PCR NEGATIVE NEGATIVE Final    Comment: (NOTE) The Xpert Xpress SARS-CoV-2/FLU/RSV plus assay is intended as an aid in the diagnosis of influenza from Nasopharyngeal swab specimens and should not be used as a sole basis for treatment. Nasal washings and aspirates are unacceptable for Xpert Xpress SARS-CoV-2/FLU/RSV testing.  Fact Sheet for Patients: BloggerCourse.com  Fact Sheet for Healthcare Providers: SeriousBroker.it  This test is not yet approved or cleared by the [place]  FDA and has been authorized for detection and/or diagnosis of SARS-CoV-2 by FDA under an Emergency Use Authorization (EUA). This EUA will remain in effect (meaning this test can be used) for the duration of the COVID-19 declaration under Section 564(b)(1) of the Act, 21 U.S.C. section  360bbb-3(b)(1), unless the authorization is terminated or revoked.     Resp Syncytial Virus by PCR NEGATIVE NEGATIVE Final    Comment: (NOTE) Fact Sheet for Patients: BloggerCourse.com  Fact Sheet for Healthcare Providers: SeriousBroker.it  This test is not yet approved or cleared by the [place]  FDA and has been authorized for detection and/or diagnosis of SARS-CoV-2 by FDA under an Emergency Use Authorization (EUA). This EUA will remain in effect (meaning this test can be used) for the duration of the COVID-19 declaration under Section 564(b)(1) of the Act, 21 U.S.C. section 360bbb-3(b)(1), unless the authorization is terminated or revoked.  Performed at [facility], [address] [place], [place] [address]   Blood Culture (routine x 2)     Status: None (Preliminary result)   Collection Time: 08/14/24 10:30 PM   Specimen: BLOOD  Result Value Ref Range Status   Specimen Description BLOOD BLOOD LEFT HAND  Final   Special Requests   Final    BOTTLES DRAWN AEROBIC ONLY Blood Culture results may not be optimal due to an inadequate volume of blood received in culture bottles   Culture   Final    NO GROWTH 4 DAYS Performed at [facility], [address] [place], [place] [address]    Report Status PENDING  Incomplete  Aerobic/Anaerobic Culture w Gram Stain (surgical/deep wound)     Status: None (Preliminary result)   Collection Time: 08/15/24 12:01 PM   Specimen: Ear, Left; Abscess  Result Value Ref Range Status   Specimen Description ABSCESS  Final  Special Requests EAR,LEFT  Final   Gram Stain NO WBC SEEN NO ORGANISMS SEEN   Final   Culture   Final    NO GROWTH 2 DAYS NO ANAEROBES ISOLATED; CULTURE IN PROGRESS FOR 5 DAYS Performed at [facility], [address], [place], [place] [address]    Report Status PENDING  Incomplete  MRSA Next Gen by PCR, Nasal     Status: None   Collection Time:  08/15/24  3:15 PM   Specimen: Nasal Mucosa; Nasal Swab  Result Value Ref Range Status   MRSA by PCR Next Gen NOT DETECTED NOT DETECTED Final    Comment: (NOTE) The GeneXpert MRSA Assay (FDA approved for NASAL specimens only), is one component of a comprehensive MRSA colonization surveillance program. It is not intended to diagnose MRSA infection nor to guide or monitor treatment for MRSA infections. Test performance is not FDA approved in patients less than 18 years old. Performed at [facility], [address], [place], [place] [address]     Radiology Report CT TEMPORAL BONES WO CONTRAST Result Date: 08/17/2024 CLINICAL DATA:  78 year old female with left facial droop, left mastoid effusion on recent brain MRI without and with contrast. EXAM: CT TEMPORAL BONES WITHOUT CONTRAST TECHNIQUE: Axial and coronal plane CT imaging of the petrous temporal bones was performed with thin-collimation image reconstruction. No intravenous contrast was administered. Multiplanar CT image reconstructions were also generated. RADIATION DOSE REDUCTION: This exam was performed according to the departmental dose-optimization program which includes automated exposure control, adjustment of the mA and/or kV according to patient size and/or use of iterative reconstruction technique. COMPARISON:  Brain MRI 08/14/2024. FINDINGS: RIGHT TEMPORAL BONE Right external auditory canal is patent. Right tympanic membrane appears normal. Right tympanic cavity is clear. Right scutum and ossicles appear intact and aligned. Right mastoid antrum is clear. Right mastoid air cells well aerated. Right otic capsule bone mineralization appears normal. Right internal auditory canal, cochlea, vestibule, vestibular aqueduct, semicircular canals, and course of the right 7th nerve appear normal. LEFT TEMPORAL BONE Subtotal opacification of the left external auditory canal (series 10, image 115 and series 6, image 60. Subtotal opacification of  the right tympanic cavity. Left tympanic membrane obscured. Left mastoid antrum opacified. Mostly opacified left mastoid air cells, including air cells with fluid levels (series 6, image 40). No bony EAC erosion identified. Left scutum and ossicles appear to remain intact and aligned. No mastoid coalescence identified. Left IAC, cochlea, vestibule, vestibular aqueduct, and left semicircular canals appear normal. Left stylomastoid foramen appears normal by CT. Vascular: Calcified atherosclerosis at the skull base. Limited intracranial: Grossly stable compared to head CT last month. Visible orbits/paranasal sinuses: Orbits not included. Right maxillary sinus mucoperiosteal thickening again noted. Trace layering fluid in the left sphenoid sinus. Soft tissues: Visible noncontrast deep soft tissue spaces of the face appear negative. IMPRESSION: 1. Subtotal opacification of the left external auditory canal, left tympanic cavity, and left mastoid. No bone erosion is associated. Although nonspecific the constellation favors Infectious combined External Otitis, Otitis Media with reactive mastoid effusion. An EAC tumor (such as squamous cell carcinoma) could not be excluded. 2. Normal right temporal bone. 3. Chronic right maxillary sinusitis. Trace layering paranasal sinus fluid elsewhere. Electronically Signed   By: VEAR Hurst M.D.   On: 08/17/2024 12:36     Signature  -   Lavada Stank M.D on 08/18/2024 at 10:08 AM   -  To page go to [email or web address]

## 2024-08-18 NOTE — TOC Progression Note (Signed)
 Transition of Care [facility]) - Progression Note    Patient Details  Name: Beth [name] MRN: [id number] Date of Birth: 1946/06/07  Transition of Care [facility]) CM/SW Contact  Inocente GORMAN Kindle, LCSW Phone Number: 08/18/2024, 1:21 PM  Clinical Narrative:    CSW updated [name] on patient's potential discharge tomorrow. CSW initiated insurance authorization process with [place] at [facility] for SNF and PTAR.    Expected Discharge Plan: Skilled Nursing Facility Barriers to Discharge: Continued Medical Work up, English as a second language teacher               Expected Discharge Plan and Services In-house Referral: Clinical Social Work   Post Acute Care Choice: Skilled Nursing Facility Living arrangements for the past 2 months: Single Family Home                                       Social Drivers of Health (SDOH) Interventions SDOH Screenings   Food Insecurity: No Food Insecurity (08/15/2024)  Housing: Low Risk  (08/15/2024)  Transportation Needs: No Transportation Needs (08/15/2024)  Recent Concern: Transportation Needs - Unmet Transportation Needs (07/20/2024)   Received [name]  Utilities: Not At Risk (08/15/2024)  Alcohol Screen: Low Risk  (12/30/2023)  Depression (PHQ2-9): Medium Risk (07/20/2024)  Financial Resource Strain: Low Risk  (07/20/2024)   Received from [facility]  Physical Activity: Inactive (07/20/2024)   Received from [facility]  Social Connections: Moderately Integrated (08/15/2024)  Recent Concern: Social Connections - Socially Isolated (07/20/2024)   Received from [facility]  Stress: No Stress Concern Present (07/20/2024)   Received from [facility]  Tobacco Use: Low Risk  (08/15/2024)  Health Literacy: Low Risk  (07/20/2024)   Received from [facility]    Readmission Risk Interventions     No data to display

## 2024-08-18 NOTE — Plan of Care (Signed)
 Patient reported that she continues to cough without any improvement with multiple trial of Tessalon , Robitussin, Mucinex , codeine  with Mucinex  without any luck. -Will give a trial of  Tussionex twice daily as needed.

## 2024-08-19 DIAGNOSIS — K920 Hematemesis: Secondary | ICD-10-CM | POA: Diagnosis present

## 2024-08-19 DIAGNOSIS — H60502 Unspecified acute noninfective otitis externa, left ear: Secondary | ICD-10-CM | POA: Diagnosis not present

## 2024-08-19 DIAGNOSIS — Y831 Surgical operation with implant of artificial internal device as the cause of abnormal reaction of the patient, or of later complication, without mention of misadventure at the time of the procedure: Secondary | ICD-10-CM | POA: Diagnosis present

## 2024-08-19 DIAGNOSIS — E871 Hypo-osmolality and hyponatremia: Secondary | ICD-10-CM | POA: Diagnosis present

## 2024-08-19 DIAGNOSIS — L539 Erythematous condition, unspecified: Secondary | ICD-10-CM | POA: Diagnosis not present

## 2024-08-19 DIAGNOSIS — S82201A Unspecified fracture of shaft of right tibia, initial encounter for closed fracture: Secondary | ICD-10-CM | POA: Diagnosis not present

## 2024-08-19 DIAGNOSIS — S72401A Unspecified fracture of lower end of right femur, initial encounter for closed fracture: Secondary | ICD-10-CM | POA: Diagnosis not present

## 2024-08-19 DIAGNOSIS — N184 Chronic kidney disease, stage 4 (severe): Secondary | ICD-10-CM | POA: Diagnosis present

## 2024-08-19 DIAGNOSIS — K2971 Gastritis, unspecified, with bleeding: Secondary | ICD-10-CM | POA: Diagnosis present

## 2024-08-19 DIAGNOSIS — M79602 Pain in left arm: Secondary | ICD-10-CM | POA: Diagnosis not present

## 2024-08-19 DIAGNOSIS — E039 Hypothyroidism, unspecified: Secondary | ICD-10-CM | POA: Diagnosis present

## 2024-08-19 DIAGNOSIS — H66002 Acute suppurative otitis media without spontaneous rupture of ear drum, left ear: Secondary | ICD-10-CM | POA: Diagnosis present

## 2024-08-19 DIAGNOSIS — S82001A Unspecified fracture of right patella, initial encounter for closed fracture: Secondary | ICD-10-CM | POA: Diagnosis not present

## 2024-08-19 DIAGNOSIS — Z0289 Encounter for other administrative examinations: Secondary | ICD-10-CM | POA: Diagnosis not present

## 2024-08-19 DIAGNOSIS — E872 Acidosis, unspecified: Secondary | ICD-10-CM | POA: Diagnosis present

## 2024-08-19 DIAGNOSIS — R29810 Facial weakness: Secondary | ICD-10-CM | POA: Diagnosis not present

## 2024-08-19 DIAGNOSIS — R918 Other nonspecific abnormal finding of lung field: Secondary | ICD-10-CM | POA: Diagnosis not present

## 2024-08-19 DIAGNOSIS — I7090 Unspecified atherosclerosis: Secondary | ICD-10-CM | POA: Diagnosis not present

## 2024-08-19 DIAGNOSIS — K2950 Unspecified chronic gastritis without bleeding: Secondary | ICD-10-CM | POA: Diagnosis not present

## 2024-08-19 DIAGNOSIS — Z87898 Personal history of other specified conditions: Secondary | ICD-10-CM | POA: Diagnosis not present

## 2024-08-19 DIAGNOSIS — R9431 Abnormal electrocardiogram [ECG] [EKG]: Secondary | ICD-10-CM | POA: Diagnosis not present

## 2024-08-19 DIAGNOSIS — E8809 Other disorders of plasma-protein metabolism, not elsewhere classified: Secondary | ICD-10-CM | POA: Diagnosis present

## 2024-08-19 DIAGNOSIS — Z794 Long term (current) use of insulin: Secondary | ICD-10-CM | POA: Diagnosis not present

## 2024-08-19 DIAGNOSIS — M1712 Unilateral primary osteoarthritis, left knee: Secondary | ICD-10-CM | POA: Diagnosis not present

## 2024-08-19 DIAGNOSIS — R1013 Epigastric pain: Secondary | ICD-10-CM | POA: Diagnosis not present

## 2024-08-19 DIAGNOSIS — Z1152 Encounter for screening for COVID-19: Secondary | ICD-10-CM | POA: Diagnosis not present

## 2024-08-19 DIAGNOSIS — Z96641 Presence of right artificial hip joint: Secondary | ICD-10-CM | POA: Diagnosis not present

## 2024-08-19 DIAGNOSIS — R1084 Generalized abdominal pain: Secondary | ICD-10-CM | POA: Diagnosis not present

## 2024-08-19 DIAGNOSIS — I130 Hypertensive heart and chronic kidney disease with heart failure and stage 1 through stage 4 chronic kidney disease, or unspecified chronic kidney disease: Secondary | ICD-10-CM | POA: Diagnosis present

## 2024-08-19 DIAGNOSIS — Z96651 Presence of right artificial knee joint: Secondary | ICD-10-CM | POA: Diagnosis not present

## 2024-08-19 DIAGNOSIS — Z7901 Long term (current) use of anticoagulants: Secondary | ICD-10-CM | POA: Diagnosis not present

## 2024-08-19 DIAGNOSIS — Z86711 Personal history of pulmonary embolism: Secondary | ICD-10-CM | POA: Diagnosis not present

## 2024-08-19 DIAGNOSIS — E1122 Type 2 diabetes mellitus with diabetic chronic kidney disease: Secondary | ICD-10-CM | POA: Diagnosis present

## 2024-08-19 DIAGNOSIS — I69398 Other sequelae of cerebral infarction: Secondary | ICD-10-CM | POA: Diagnosis not present

## 2024-08-19 DIAGNOSIS — Z515 Encounter for palliative care: Secondary | ICD-10-CM | POA: Diagnosis not present

## 2024-08-19 DIAGNOSIS — N179 Acute kidney failure, unspecified: Secondary | ICD-10-CM | POA: Diagnosis present

## 2024-08-19 DIAGNOSIS — R1111 Vomiting without nausea: Secondary | ICD-10-CM | POA: Diagnosis not present

## 2024-08-19 DIAGNOSIS — L03114 Cellulitis of left upper limb: Secondary | ICD-10-CM | POA: Diagnosis present

## 2024-08-19 DIAGNOSIS — D631 Anemia in chronic kidney disease: Secondary | ICD-10-CM | POA: Diagnosis present

## 2024-08-19 DIAGNOSIS — M6281 Muscle weakness (generalized): Secondary | ICD-10-CM | POA: Diagnosis not present

## 2024-08-19 DIAGNOSIS — S82101A Unspecified fracture of upper end of right tibia, initial encounter for closed fracture: Secondary | ICD-10-CM | POA: Diagnosis not present

## 2024-08-19 DIAGNOSIS — R41841 Cognitive communication deficit: Secondary | ICD-10-CM | POA: Diagnosis not present

## 2024-08-19 DIAGNOSIS — I6523 Occlusion and stenosis of bilateral carotid arteries: Secondary | ICD-10-CM | POA: Diagnosis not present

## 2024-08-19 DIAGNOSIS — G510 Bell's palsy: Secondary | ICD-10-CM | POA: Diagnosis not present

## 2024-08-19 DIAGNOSIS — M00822 Arthritis due to other bacteria, left elbow: Secondary | ICD-10-CM | POA: Diagnosis not present

## 2024-08-19 DIAGNOSIS — D509 Iron deficiency anemia, unspecified: Secondary | ICD-10-CM | POA: Diagnosis present

## 2024-08-19 DIAGNOSIS — E785 Hyperlipidemia, unspecified: Secondary | ICD-10-CM | POA: Diagnosis present

## 2024-08-19 DIAGNOSIS — K449 Diaphragmatic hernia without obstruction or gangrene: Secondary | ICD-10-CM | POA: Diagnosis not present

## 2024-08-19 DIAGNOSIS — R2689 Other abnormalities of gait and mobility: Secondary | ICD-10-CM | POA: Diagnosis not present

## 2024-08-19 DIAGNOSIS — I700 Atherosclerosis of aorta: Secondary | ICD-10-CM | POA: Diagnosis not present

## 2024-08-19 DIAGNOSIS — T182XXA Foreign body in stomach, initial encounter: Secondary | ICD-10-CM | POA: Diagnosis not present

## 2024-08-19 DIAGNOSIS — R1311 Dysphagia, oral phase: Secondary | ICD-10-CM | POA: Diagnosis not present

## 2024-08-19 DIAGNOSIS — M25562 Pain in left knee: Secondary | ICD-10-CM | POA: Diagnosis not present

## 2024-08-19 DIAGNOSIS — I5032 Chronic diastolic (congestive) heart failure: Secondary | ICD-10-CM | POA: Diagnosis present

## 2024-08-19 DIAGNOSIS — M009 Pyogenic arthritis, unspecified: Secondary | ICD-10-CM | POA: Diagnosis not present

## 2024-08-19 DIAGNOSIS — J9601 Acute respiratory failure with hypoxia: Secondary | ICD-10-CM | POA: Diagnosis not present

## 2024-08-19 DIAGNOSIS — H9202 Otalgia, left ear: Secondary | ICD-10-CM | POA: Diagnosis not present

## 2024-08-19 DIAGNOSIS — R600 Localized edema: Secondary | ICD-10-CM | POA: Diagnosis not present

## 2024-08-19 DIAGNOSIS — I5030 Unspecified diastolic (congestive) heart failure: Secondary | ICD-10-CM | POA: Diagnosis not present

## 2024-08-19 DIAGNOSIS — T8453XA Infection and inflammatory reaction due to internal right knee prosthesis, initial encounter: Secondary | ICD-10-CM | POA: Diagnosis present

## 2024-08-19 DIAGNOSIS — K5730 Diverticulosis of large intestine without perforation or abscess without bleeding: Secondary | ICD-10-CM | POA: Diagnosis not present

## 2024-08-19 DIAGNOSIS — M19022 Primary osteoarthritis, left elbow: Secondary | ICD-10-CM | POA: Diagnosis not present

## 2024-08-19 DIAGNOSIS — Z7401 Bed confinement status: Secondary | ICD-10-CM | POA: Diagnosis not present

## 2024-08-19 DIAGNOSIS — M25561 Pain in right knee: Secondary | ICD-10-CM | POA: Diagnosis not present

## 2024-08-19 DIAGNOSIS — Z66 Do not resuscitate: Secondary | ICD-10-CM | POA: Diagnosis present

## 2024-08-19 DIAGNOSIS — I517 Cardiomegaly: Secondary | ICD-10-CM | POA: Diagnosis not present

## 2024-08-19 DIAGNOSIS — M25422 Effusion, left elbow: Secondary | ICD-10-CM | POA: Diagnosis not present

## 2024-08-19 DIAGNOSIS — Z7189 Other specified counseling: Secondary | ICD-10-CM | POA: Diagnosis not present

## 2024-08-19 DIAGNOSIS — E669 Obesity, unspecified: Secondary | ICD-10-CM | POA: Diagnosis present

## 2024-08-19 DIAGNOSIS — R0600 Dyspnea, unspecified: Secondary | ICD-10-CM | POA: Diagnosis not present

## 2024-08-19 DIAGNOSIS — K922 Gastrointestinal hemorrhage, unspecified: Secondary | ICD-10-CM | POA: Diagnosis not present

## 2024-08-19 DIAGNOSIS — I2489 Other forms of acute ischemic heart disease: Secondary | ICD-10-CM | POA: Diagnosis present

## 2024-08-19 DIAGNOSIS — I10 Essential (primary) hypertension: Secondary | ICD-10-CM | POA: Diagnosis not present

## 2024-08-19 LAB — CBC WITH DIFFERENTIAL/PLATELET
Abs Immature Granulocytes: 0.03 K/uL (ref 0.00–0.07)
Basophils Absolute: 0 K/uL (ref 0.0–0.1)
Basophils Relative: 1 %
Eosinophils Absolute: 0.2 K/uL (ref 0.0–0.5)
Eosinophils Relative: 3 %
HCT: 28.7 % — ABNORMAL LOW (ref 36.0–46.0)
Hemoglobin: 9.1 g/dL — ABNORMAL LOW (ref 12.0–15.0)
Immature Granulocytes: 1 %
Lymphocytes Relative: 20 %
Lymphs Abs: 1.2 K/uL (ref 0.7–4.0)
MCH: 26.7 pg (ref 26.0–34.0)
MCHC: 31.7 g/dL (ref 30.0–36.0)
MCV: 84.2 fL (ref 80.0–100.0)
Monocytes Absolute: 0.9 K/uL (ref 0.1–1.0)
Monocytes Relative: 16 %
Neutro Abs: 3.5 K/uL (ref 1.7–7.7)
Neutrophils Relative %: 59 %
Platelets: 304 K/uL (ref 150–400)
RBC: 3.41 MIL/uL — ABNORMAL LOW (ref 3.87–5.11)
RDW: 16.1 % — ABNORMAL HIGH (ref 11.5–15.5)
WBC: 5.8 K/uL (ref 4.0–10.5)
nRBC: 0 % (ref 0.0–0.2)

## 2024-08-19 LAB — CULTURE, BLOOD (ROUTINE X 2)
Culture: NO GROWTH
Culture: NO GROWTH
Special Requests: ADEQUATE

## 2024-08-19 LAB — BASIC METABOLIC PANEL WITH GFR
Anion gap: 12 (ref 5–15)
BUN: 32 mg/dL — ABNORMAL HIGH (ref 8–23)
CO2: 19 mmol/L — ABNORMAL LOW (ref 22–32)
Calcium: 7.8 mg/dL — ABNORMAL LOW (ref 8.9–10.3)
Chloride: 105 mmol/L (ref 98–111)
Creatinine, Ser: 2.13 mg/dL — ABNORMAL HIGH (ref 0.44–1.00)
GFR, Estimated: 23 mL/min — ABNORMAL LOW (ref >60)
Glucose, Bld: 126 mg/dL — ABNORMAL HIGH (ref 70–99)
Potassium: 3.8 mmol/L (ref 3.5–5.1)
Sodium: 136 mmol/L (ref 135–145)

## 2024-08-19 LAB — PROCALCITONIN: Procalcitonin: 0.13 ng/mL

## 2024-08-19 LAB — C-REACTIVE PROTEIN: CRP: 13.5 mg/dL — ABNORMAL HIGH (ref <1.0)

## 2024-08-19 LAB — MAGNESIUM: Magnesium: 1.9 mg/dL (ref 1.7–2.4)

## 2024-08-19 MED ORDER — HYDROCODONE-ACETAMINOPHEN 5-325 MG PO TABS: 1.0000 | ORAL_TABLET | Freq: Four times a day (QID) | ORAL | 0 refills | Status: DC | PRN

## 2024-08-19 MED ORDER — HYDROCORTISONE 1 % EX CREA: TOPICAL_CREAM | Freq: Two times a day (BID) | CUTANEOUS | 0 refills | Status: DC

## 2024-08-19 MED ORDER — AMOXICILLIN-POT CLAVULANATE 875-125 MG PO TABS
1.0000 | ORAL_TABLET | Freq: Two times a day (BID) | ORAL | Status: DC
Start: 2024-08-19 — End: 2024-09-03

## 2024-08-19 MED ORDER — VALACYCLOVIR HCL 500 MG PO TABS: 500.0000 mg | ORAL_TABLET | Freq: Two times a day (BID) | ORAL | Status: DC

## 2024-08-19 NOTE — Care Management Important Message (Signed)
 Important Message  Patient Details  Name: Beth [name] MRN: [id number] Date of Birth: September 24, 1946   Important Message Given:  Yes - Medicare IM     Claretta Deed 08/19/2024, 8:10 AM

## 2024-08-19 NOTE — TOC Transition Note (Addendum)
 Transition of Care [facility]) - Discharge Note   Patient Details  Name: Beth [name] MRN: [id number] Date of Birth: 1946/11/03  Transition of Care [facility]) CM/SW Contact:  [name], LCSW Phone Number: 08/19/2024, 10:57 AM   Clinical Narrative:     [name] SNF and Ambulance auth approved.  DWQ#[id number] Ambulance#[id number]  Per MD patient ready for DC to [facility]. RN, patient, and facility notified of DC. Discharge Summary and FL2 sent to facility. RN to call report prior to discharge [phone]). DC packet on chart. Ambulance transport requested for patient.   CSW will sign off for now as social work intervention is no longer needed. Please consult us  again if new needs arise.   Final next level of care: Skilled Nursing Facility Barriers to Discharge: No Barriers Identified   Patient Goals and CMS Choice Patient states their goals for this hospitalization and ongoing recovery are:: Return to rehab CMS Medicare.gov Compare Post Acute Care list provided to:: Patient Choice offered to / list presented to : Patient [name] ownership interest in [facility].provided to:: Patient    Discharge Placement              Patient [facility] bed at: [name] Patient to be transferred to facility by: ptar Name of family member notified: Pt states she will notify family Patient and family notified of of transfer: 08/19/24  Discharge Plan and Services Additional resources added to the After Visit Summary for   In-house Referral: Clinical Social Work   Post Acute Care Choice: Skilled Nursing Facility                               Social Drivers of Health (SDOH) Interventions SDOH Screenings   Food Insecurity: No Food Insecurity (08/15/2024)  Housing: Low Risk  (08/15/2024)  Transportation Needs: No Transportation Needs (08/15/2024)  Recent Concern: Transportation Needs - Unmet Transportation Needs (07/20/2024)   Received from [facility]  Utilities: Not At Risk  (08/15/2024)  Alcohol Screen: Low Risk  (12/30/2023)  Depression (PHQ2-9): Medium Risk (07/20/2024)  Financial Resource Strain: Low Risk  (07/20/2024)   Received from [facility]  Physical Activity: Inactive (07/20/2024)   Received from [facility]  Social Connections: Moderately Integrated (08/15/2024)  Recent Concern: Social Connections - Socially Isolated (07/20/2024)   Received from [facility]  Stress: No Stress Concern Present (07/20/2024)   Received from [facility]  Tobacco Use: Low Risk  (08/15/2024)  Health Literacy: Low Risk  (07/20/2024)   Received from [facility]     Readmission Risk Interventions     No data to display

## 2024-08-19 NOTE — TOC Transition Note (Signed)
 Transition of Care [facility]) - Discharge Note   Patient Details  Name: Beth [name] MRN: [id number] Date of Birth: 1946-05-20  Transition of Care [facility]) CM/SW Contact:  [name], LCSW Phone Number: 08/19/2024, 9:11 AM   Clinical Narrative:     SNF auth still pending at this time.     Barriers to Discharge: Continued Medical Work up, English as a second language teacher   Patient Goals and CMS Choice Patient states their goals for this hospitalization and ongoing recovery are:: Return to rehab CMS Medicare.gov Compare Post Acute Care list provided to:: Patient Choice offered to / list presented to : Patient [place] ownership interest in [facility].provided to:: Patient              Discharge Plan and Services Additional resources added to the After Visit Summary for   In-house Referral: Clinical Social Work   Post Acute Care Choice: Skilled Nursing Facility                               Social Drivers of Health (SDOH) Interventions SDOH Screenings   Food Insecurity: No Food Insecurity (08/15/2024)  Housing: Low Risk  (08/15/2024)  Transportation Needs: No Transportation Needs (08/15/2024)  Recent Concern: Transportation Needs - Unmet Transportation Needs (07/20/2024)   Received from [facility]  Utilities: Not At Risk (08/15/2024)  Alcohol Screen: Low Risk  (12/30/2023)  Depression (PHQ2-9): Medium Risk (07/20/2024)  Financial Resource Strain: Low Risk  (07/20/2024)   Received from [facility]  Physical Activity: Inactive (07/20/2024)   Received from [facility]  Social Connections: Moderately Integrated (08/15/2024)  Recent Concern: Social Connections - Socially Isolated (07/20/2024)   Received from [facility]  Stress: No Stress Concern Present (07/20/2024)   Received from [facility]  Tobacco Use: Low Risk  (08/15/2024)  Health Literacy: Low Risk  (07/20/2024)   Received from [facility]     Readmission Risk Interventions     No data  to display

## 2024-08-19 NOTE — Discharge Instructions (Signed)
 Follow with Primary MD Zollie Lowers, MD in 7 days, follow-up with the recommended ENT physician coming Monday or Tuesday.  Get CBC, CMP, Magnesium -  checked next visit with your primary MD or SNF MD    Activity: As tolerated with Full fall precautions use walker/cane & assistance as needed  Disposition SNF  Diet: Heart Healthy low carbohydrate diet.  Check CBGs q. ACHS.  Special Instructions: If you have smoked or chewed Tobacco  in the last 2 yrs please stop smoking, stop any regular Alcohol  and or any Recreational drug use.  On your next visit with your primary care physician please Get Medicines reviewed and adjusted.  Please request your Prim.MD to go over all Hospital Tests and Procedure/Radiological results at the follow up, please get all Hospital records sent to your Prim MD by signing hospital release before you go home.  If you experience worsening of your admission symptoms, develop shortness of breath, life threatening emergency, suicidal or homicidal thoughts you must seek medical attention immediately by calling 911 or calling your MD immediately  if symptoms less severe.  You Must read complete instructions/literature along with all the possible adverse reactions/side effects for all the Medicines you take and that have been prescribed to you. Take any new Medicines after you have completely understood and accpet all the possible adverse reactions/side effects.   Do not drive when taking Pain medications.  Do not take more than prescribed Pain, Sleep and Anxiety Medications  Wear Seat belts while driving.

## 2024-08-19 NOTE — Discharge Summary (Signed)
 Beth [name] FMW:[id number] DOB: [birth year] DOA: 08/14/2024  PCP: Zollie Lowers, MD  Admit date: 08/14/2024  Discharge date: 08/19/2024  Admitted From: SNF   Disposition:  SNF   Recommendations for Outpatient Follow-up:   Follow up with PCP in 1-2 weeks  PCP Please obtain BMP/CBC, 2 view CXR in 1week,  (see Discharge instructions)   PCP Please follow up on the following pending results:    Home Health: None   Equipment/Devices: None  Consultations: ENT Discharge Condition: Stable    CODE STATUS: Full    Diet Recommendation: Heart Healthy Low Carb    Chief Complaint  Patient presents with   Facial Droop     Brief history of present illness from the day of admission and additional interim summary     78 y.o. female with medical history significant of hypertension, hyperlipidemia, diastolic CHF, CVA, diabetes mellitus type 2, and chronic kidney disease presents with left ear pain and facial drooping.   She has persistent left ear pain that has not improved despite treatment with IV antibiotics during her recent hospital stay. The pain is severe and ongoing since her last visit. She has been receiving Tylenol  for pain management at the rehab center, which provides some relief. Last night, she received an unspecified pain medication that was effective. She has been on ciprofloxacin  and steroids for her ear, along with five different ear drops over the past four weeks. No headaches, but the pain is localized to the left side of her face and ear.   She reports facial drooping on the left side, which began two days ago. Her children have noticed a difference in her facial appearance, and she experiences blurred vision on the left side. No difficulty with speech is noted.  He was diagnosed with left otitis media, MRI of  the brain was acute ENT was consulted and she was admitted.                                                                 Hospital Course   Facial droop with left otitis externa along with otitis media with some compression on left facial nerve with facial nerve weakness, recently had left ear herpes breakout as well few weeks.    Negative MRI brain, seen by ENT, underwent left myringotomy and tube placement by Dr. Roark on 08/15/2024, on antibiotics and improving.  Continue to monitor.  She is having slightly more pain on 08/17/2024, case discussed with Dr. Roark including her CT of the temporal bone on 08/18/2024.  Continue antibiotics finish course, add valacyclovir  PO, steroid ear cream, outpatient ENT follow-up, clinically continues to improve.  Will give her 7 more days of oral Augmentin  and valacyclovir  starting 08/19/2024 Continue ear ointments for another 10 days as below.     Hypertensive  urgency Due to ear discomfort stable now.   History of pulmonary embolism Continue Eliquis  no acute issues, lower extremity venous duplex nonacute.   Heart failure with preserved EF Echocardiogram from 8/25 noted EF to be 60 to 65% with grade 1 diastolic dysfunction, normal RV, and no significant valvular disease present. - Remains compensated   Chronic kidney disease stage IV Creatinine close to 2.5, close to baseline.   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  - Continue PT/OT, lower extremity venous duplex nonacute.   Controlled diabetes mellitus type 2, without long-term use of insulin  Last hemoglobin A1c noted to be 6.5 on 8/25. Low carbohydrate diet with outpatient home regimen to continue.    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  Get CBC, CMP, Magnesium -  checked next visit with your primary MD or SNF MD    Activity: As tolerated with Full fall precautions use walker/cane & assistance as needed  Disposition SNF  Diet: Heart Healthy low carbohydrate diet.  Check CBGs q. ACHS.  Special Instructions: If you have smoked or chewed Tobacco  in the last 2 yrs please stop smoking, stop any regular Alcohol  and or any Recreational drug use.  On your next visit with your primary care physician please Get Medicines reviewed and adjusted.  Please request your Prim.MD to go over all Hospital Tests and Procedure/Radiological results at the follow up, please get all Hospital records sent to your Prim MD by signing hospital release before you go home.  If you experience worsening of your admission symptoms, develop shortness of breath, life threatening emergency, suicidal or homicidal thoughts you must seek medical attention immediately by calling 911 or calling your MD immediately  if symptoms less severe.  You Must read complete instructions/literature along with all the possible adverse reactions/side effects for all the Medicines you take and that have been prescribed to you. Take any new Medicines after you have completely understood and accpet all the possible adverse reactions/side effects.   Do not drive when taking Pain medications.  Do not take more than prescribed Pain, Sleep and Anxiety Medications  Wear Seat belts while driving.   Increase activity slowly    Complete by: As directed        Discharge Medications   Allergies as of 08/19/2024       Reactions   Cefdinir Swelling, Rash   Tolerated cephalosporins many times in the past   Aleve [naproxen Sodium] Other (See Comments)   Heart races   Dorethia Carne ] Other (See Comments)   Nose bleeding   Ciprofloxacin  Other (See Comments)   Possible hamstring tendinopathy   Clonidine  Derivatives Other (See Comments)   Dizziness and weakness   Shellfish Allergy Nausea And Vomiting  Medication List     STOP taking these medications    guaifenesin  100 MG/5ML syrup Commonly known as: ROBITUSSIN       TAKE these medications    alendronate  70 MG tablet Commonly known as: FOSAMAX  TAKE 1 TABLET WEEKLY (TAKE WITH 8OZ OF WATER  30 MINUTES BEFORE BREAKFAST) What changed: See the new instructions.   amoxicillin -clavulanate 875-125 MG tablet Commonly known as: AUGMENTIN  Take 1 tablet by mouth 2 (two) times daily.   apixaban  5 MG Tabs tablet Commonly known as: ELIQUIS  Take 2 tablets (10 mg total) by mouth 2 (two) times daily for 7 days, THEN 1 tablet (5 mg total) 2 (two) times daily. Start taking on: August 07, 2024   aspirin  EC 81 MG tablet Take 1 tablet (81 mg total) by mouth daily. Swallow whole.   atorvastatin  40 MG tablet Commonly known as: LIPITOR  Take 40 mg by mouth at bedtime.   azelastine  0.1 % nasal spray Commonly known as: ASTELIN  Place 1 spray into both nostrils 2 (two) times daily. Use in each nostril as directed   ciprofloxacin -dexamethasone  OTIC suspension Commonly known as: CIPRODEX  Place 4 drops into the left ear 2 (two) times daily.   colchicine  0.6 MG tablet Take twice daily for gout attack. (may take every two hours up to 6 doses at acute onset) What changed:  how much to take how to take this when to take this reasons to take this additional instructions   diltiazem  240 MG 24 hr capsule Commonly known as: CARDIZEM  CD TAKE ONE CAPSULE BY  MOUTH DAILY   empagliflozin  10 MG Tabs tablet Commonly known as: Jardiance  Take 1 tablet (10 mg total) by mouth daily.   ferrous sulfate  325 (65 FE) MG tablet Take 325 mg by mouth 2 (two) times a week. Take one tablet by mouth on Tue & Thurs.   hydrALAZINE  25 MG tablet Commonly known as: APRESOLINE  TAKE ONE (1) TABLET BY MOUTH 3 TIMES DAILY   HYDROcodone -acetaminophen  5-325 MG tablet Commonly known as: NORCO/VICODIN Take 1 tablet by mouth every 6 (six) hours as needed for moderate pain (pain score 4-6).   hydrocortisone  cream 1 % Apply topically 2 (two) times daily. To left ear   isosorbide  mononitrate 60 MG 24 hr tablet Commonly known as: IMDUR  Take 1 tablet (60 mg total) by mouth daily.   ondansetron  4 MG disintegrating tablet Commonly known as: ZOFRAN -ODT Take 4 mg by mouth every 8 (eight) hours as needed for vomiting or nausea.   valACYclovir  500 MG tablet Commonly known as: VALTREX  Take 1 tablet (500 mg total) by mouth 2 (two) times daily.         Contact information for follow-up providers     Zollie Lowers, MD. Schedule an appointment as soon as possible for a visit in 1 week(s).   Specialty: Family Medicine Contact information: [address] [place] [address] [phone]         Roark Rush, MD. Schedule an appointment as soon as possible for a visit in 3 day(s).   Specialty: Otolaryngology Contact information: [address], [address] [place] [address] [phone]              Contact information for after-discharge care     Destination     [facility] [place] .   Service: Skilled Nursing Contact information: [address] [place]  [phone]  Major procedures and Radiology Reports - PLEASE review detailed and final reports thoroughly  -     CT TEMPORAL BONES WO CONTRAST Result Date: 08/17/2024 CLINICAL DATA:  78 year old female with left  facial droop, left mastoid effusion on recent brain MRI without and with contrast. EXAM: CT TEMPORAL BONES WITHOUT CONTRAST TECHNIQUE: Axial and coronal plane CT imaging of the petrous temporal bones was performed with thin-collimation image reconstruction. No intravenous contrast was administered. Multiplanar CT image reconstructions were also generated. RADIATION DOSE REDUCTION: This exam was performed according to the departmental dose-optimization program which includes automated exposure control, adjustment of the mA and/or kV according to patient size and/or use of iterative reconstruction technique. COMPARISON:  Brain MRI 08/14/2024. FINDINGS: RIGHT TEMPORAL BONE Right external auditory canal is patent. Right tympanic membrane appears normal. Right tympanic cavity is clear. Right scutum and ossicles appear intact and aligned. Right mastoid antrum is clear. Right mastoid air cells well aerated. Right otic capsule bone mineralization appears normal. Right internal auditory canal, cochlea, vestibule, vestibular aqueduct, semicircular canals, and course of the right 7th nerve appear normal. LEFT TEMPORAL BONE Subtotal opacification of the left external auditory canal (series 10, image 115 and series 6, image 60. Subtotal opacification of the right tympanic cavity. Left tympanic membrane obscured. Left mastoid antrum opacified. Mostly opacified left mastoid air cells, including air cells with fluid levels (series 6, image 40). No bony EAC erosion identified. Left scutum and ossicles appear to remain intact and aligned. No mastoid coalescence identified. Left IAC, cochlea, vestibule, vestibular aqueduct, and left semicircular canals appear normal. Left stylomastoid foramen appears normal by CT. Vascular: Calcified atherosclerosis at the skull base. Limited intracranial: Grossly stable compared to head CT last month. Visible orbits/paranasal sinuses: Orbits not included. Right maxillary sinus mucoperiosteal  thickening again noted. Trace layering fluid in the left sphenoid sinus. Soft tissues: Visible noncontrast deep soft tissue spaces of the face appear negative. IMPRESSION: 1. Subtotal opacification of the left external auditory canal, left tympanic cavity, and left mastoid. No bone erosion is associated. Although nonspecific the constellation favors Infectious combined External Otitis, Otitis Media with reactive mastoid effusion. An EAC tumor (such as squamous cell carcinoma) could not be excluded. 2. Normal right temporal bone. 3. Chronic right maxillary sinusitis. Trace layering paranasal sinus fluid elsewhere. Electronically Signed   By: VEAR Hurst M.D.   On: 08/17/2024 12:36   VAS US  LOWER EXTREMITY VENOUS (DVT) Result Date: 08/16/2024  Lower Venous DVT Study Patient Name:  SOLINA HERON  Date of Exam:   08/15/2024 Medical Rec #: [id number]     Accession #:    [id number] Date of Birth: 04-07-1946     Patient Gender: F Patient Age:   89 years Exam Location:  [facility] Procedure:      VAS US  LOWER EXTREMITY VENOUS (DVT) Referring Phys: MAXIMINO SHARPS --------------------------------------------------------------------------------  Indications: Pulmonary embolism.  Limitations: Body habitus and pt unable to tolerate compression. Performing Technologist: Elmarie Lindau, RVT  Examination Guidelines: A complete evaluation includes B-mode imaging, spectral Doppler, color Doppler, and power Doppler as needed of all accessible portions of each vessel. Bilateral testing is considered an integral part of a complete examination. Limited examinations for reoccurring indications may be performed as noted. The reflux portion of the exam is performed with the patient in reverse Trendelenburg.  +---------+---------------+---------+-----------+----------+-------------------+ RIGHT    CompressibilityPhasicitySpontaneityPropertiesThrombus Aging       +---------+---------------+---------+-----------+----------+-------------------+ CFV      Full  Yes      Yes                                      +---------+---------------+---------+-----------+----------+-------------------+ SFJ      Full                                                             +---------+---------------+---------+-----------+----------+-------------------+ FV Prox  Full                                                             +---------+---------------+---------+-----------+----------+-------------------+ FV Mid                                                full color flow     +---------+---------------+---------+-----------+----------+-------------------+ FV Distal                                             Not well visualized +---------+---------------+---------+-----------+----------+-------------------+ PFV      Full                                                             +---------+---------------+---------+-----------+----------+-------------------+ POP                                                   Not well visualized +---------+---------------+---------+-----------+----------+-------------------+ PTV      Full                                                             +---------+---------------+---------+-----------+----------+-------------------+ PERO     Full                                                             +---------+---------------+---------+-----------+----------+-------------------+   +---------+---------------+---------+-----------+----------+---------------+ LEFT     CompressibilityPhasicitySpontaneityPropertiesThrombus Aging  +---------+---------------+---------+-----------+----------+---------------+ CFV      Full           Yes      Yes                                  +---------+---------------+---------+-----------+----------+---------------+  SFJ      Full                                                          +---------+---------------+---------+-----------+----------+---------------+ FV Prox  Full                                                         +---------+---------------+---------+-----------+----------+---------------+ FV Mid                                                full color flow +---------+---------------+---------+-----------+----------+---------------+ FV Distal                                             full color flow +---------+---------------+---------+-----------+----------+---------------+ PFV      Full                                                         +---------+---------------+---------+-----------+----------+---------------+ POP      Full           Yes      Yes                                  +---------+---------------+---------+-----------+----------+---------------+ PTV      Full                                                         +---------+---------------+---------+-----------+----------+---------------+ PERO     Full                                                         +---------+---------------+---------+-----------+----------+---------------+     Summary: RIGHT: - There is no evidence of deep vein thrombosis in the lower extremity. However, portions of this examination were limited- see technologist comments above.  LEFT: - There is no evidence of deep vein thrombosis in the lower extremity.  - No cystic structure found in the popliteal fossa.  *See table(s) above for measurements and observations. Electronically signed by Penne Colorado MD on 08/16/2024 at 3:35:39 PM.    Final    MR Brain W and Wo Contrast Result Date: 08/14/2024 CLINICAL DATA:  L facial droop EXAM: MRI HEAD WITHOUT AND WITH CONTRAST TECHNIQUE: Multiplanar, multiecho pulse sequences of the brain and surrounding structures were obtained without and with intravenous contrast. CONTRAST:  10mL GADAVIST   GADOBUTROL  1 MMOL/ML IV SOLN COMPARISON:  CT head 08/01/2024. FINDINGS: Brain: No acute infarction, acute hemorrhage, hydrocephalus, extra-axial collection or mass lesion. Numerous punctate foci of susceptibility artifact predominantly in the cerebellum, thalami and basal ganglia and to lesser extent scattered throughout the supratentorial brain, compatible with chronic microhemorrhages. Moderate T2/FLAIR hyperintensities the white matter are compatible with chronic microvascular ischemic disease. No abnormal enhancement. Vascular: Normal flow voids. Skull and upper cervical spine: Normal marrow signal. Sinuses/Orbits: Opacified right maxillary sinus. Other: Left mastoid effusion. IMPRESSION: 1. No evidence of acute intracranial abnormality. 2. Moderate chronic microvascular ischemic disease. 3. Numerous chronic microhemorrhages, likely due to chronic hypertension. 4. Left mastoid effusion. 5. Opacified right maxillary sinus. Electronically Signed   By: Gilmore GORMAN Molt M.D.   On: 08/14/2024 22:16   DG Chest Port 1 View Result Date: 08/14/2024 CLINICAL DATA:  Sepsis, cough EXAM: PORTABLE CHEST 1 VIEW COMPARISON:  08/06/2024 FINDINGS: Single frontal view of the chest demonstrates stable enlargement of the cardiac silhouette. No acute airspace disease, effusion, or pneumothorax. No acute bony abnormalities. IMPRESSION: 1. Stable enlarged cardiac silhouette.  No acute airspace disease. Electronically Signed   By: Ozell Daring M.D.   On: 08/14/2024 21:13   NM Pulmonary Perfusion Result Date: 08/06/2024 EXAM: NM Lung Perfusion Scan. CLINICAL HISTORY: Pulmonary embolism (PE) suspected, high prob. TECHNIQUE: Radiolabeled 4.36 mCi Tc71m MAA (technetium albumin  aggregated injection solution) was administered intravenously via the right forearm. Planar images of the lungs were obtained in multiple projections. RADIOPHARMACEUTICAL: 4.36 millicurie technetium to 71m albumin  aggregated (MAA). COMPARISON: 08/06/2024 chest  radiograph. FINDINGS: PERFUSION: Small vaguely wedge-shaped perfusion defect in the anterior right mid lung without radiographic correlate. Otherwise, no perfusion defects. IMPRESSION: 1. Small vaguely wedge-shaped perfusion defect in the anterior right mid lung without radiographic correlate; segmental pulmonary embolism cannot be excluded. Otherwise no perfusion defects. Electronically signed by: Selinda Blue MD 08/06/2024 04:47 PM EDT RP Workstation: [id number]   DG CHEST PORT 1 VIEW Result Date: 08/06/2024 EXAM: 1 VIEW XRAY OF THE CHEST 08/06/2024 03:07:00 PM COMPARISON: 08/01/2024 CLINICAL HISTORY: Abnormal perfusion scan of lung FINDINGS: LUNGS AND PLEURA: No overt pulmonary edema. Low lung volumes. No focal pulmonary opacity. No pleural effusion. No pneumothorax. HEART AND MEDIASTINUM: Cardiomegaly. Aortic atherosclerosis. BONES AND SOFT TISSUES: No acute osseous abnormality. IMPRESSION: 1. No acute findings. Low lung volumes. 2. Cardiomegaly without overt edema. Electronically signed by: Selinda Blue MD 08/06/2024 03:45 PM EDT RP Workstation: [id number]   CT HEAD WO CONTRAST ( ) Result Date: 08/01/2024 CLINICAL DATA:  Initial evaluation for acute mental status change. EXAM: CT HEAD WITHOUT CONTRAST TECHNIQUE: Contiguous axial images were obtained from the base of the skull through the vertex without intravenous contrast. RADIATION DOSE REDUCTION: This exam was performed according to the departmental dose-optimization program which includes automated exposure control, adjustment of the mA and/or kV according to patient size and/or use of iterative reconstruction technique. COMPARISON:  CT from 12/12/2019 FINDINGS: Brain: Generalized age-related cerebral atrophy. Patchy hypodensity involving the supratentorial cerebral white matter, consistent with chronic small vessel ischemic disease, moderate in nature. Scattered remote lacunar infarcts present about the bilateral basal ganglia, thalami, and pons. No  acute intracranial hemorrhage. No acute large vessel territory infarct. No mass lesion, midline shift or mass effect. No hydrocephalus or extra-axial fluid collection. Vascular: No abnormal hyperdense vessel. Calcified atherosclerosis present at skull base. Skull: Scalp soft tissues within normal limits.  Calvarium intact. Sinuses/Orbits: Globes and orbital soft tissues within normal limits. Changes of chronic right maxillary sinusitis  noted. Trace left mastoid effusion noted, of doubtful significance. Other: None. IMPRESSION: 1. No acute intracranial abnormality. 2. Generalized age-related cerebral atrophy with moderate chronic small vessel ischemic disease, with multiple remote lacunar infarcts about the bilateral basal ganglia, thalami, and pons. Electronically Signed   By: Morene Hoard M.D.   On: 08/01/2024 19:03   ECHOCARDIOGRAM COMPLETE Result Date: 08/01/2024    ECHOCARDIOGRAM REPORT   Patient Name:   BRECKLYN GALVIS Date of Exam: 08/01/2024 Medical Rec #:  [id number]    Height:       65.0 in Accession #:    [id number]   Weight:       231.0 lb Date of Birth:  03-27-1946    BSA:          2.103 m Patient Age:    78 years     BP:           181/96 mmHg Patient Gender: F            HR:           57 bpm. Exam Location:  Inpatient Procedure: 2D Echo, Cardiac Doppler and Color Doppler (Both Spectral and Color            Flow Doppler were utilized during procedure). Indications:    Elevated Troponin  History:        Patient has prior history of Echocardiogram examinations, most                 recent 03/01/2016. CHF, CKD, stage 4; Risk Factors:Hypertension,                 Diabetes and Dyslipidemia.  Sonographer:    Thea Norlander RCS Referring Phys: MAXIMINO DELENA SHARPS  Sonographer Comments: Technically difficult study due to poor echo windows, suboptimal parasternal window, suboptimal apical window and suboptimal subcostal window. IMPRESSIONS  1. Left ventricular ejection fraction, by estimation, is 60 to 65%.  The left ventricle has normal function. The left ventricle has no regional wall motion abnormalities. Left ventricular diastolic parameters are consistent with Grade I diastolic dysfunction (impaired relaxation).  2. Right ventricular systolic function is normal. The right ventricular size is normal.  3. The mitral valve is normal in structure. No evidence of mitral valve regurgitation. No evidence of mitral stenosis.  4. The aortic valve is tricuspid. Aortic valve regurgitation is not visualized. Aortic valve sclerosis/calcification is present, without any evidence of aortic stenosis. Aortic valve Vmax measures 1.25 m/s.  5. The inferior vena cava is normal in size with greater than 50% respiratory variability, suggesting right atrial pressure of 3 mmHg. FINDINGS  Left Ventricle: Left ventricular ejection fraction, by estimation, is 60 to 65%. The left ventricle has normal function. The left ventricle has no regional wall motion abnormalities. The left ventricular internal cavity size was normal in size. There is  no left ventricular hypertrophy. Left ventricular diastolic parameters are consistent with Grade I diastolic dysfunction (impaired relaxation). Normal left ventricular filling pressure. Right Ventricle: The right ventricular size is normal. No increase in right ventricular wall thickness. Right ventricular systolic function is normal. Left Atrium: Left atrial size was normal in size. Right Atrium: Right atrial size was normal in size. Pericardium: There is no evidence of pericardial effusion. Mitral Valve: The mitral valve is normal in structure. No evidence of mitral valve regurgitation. No evidence of mitral valve stenosis. Tricuspid Valve: The tricuspid valve is normal in structure. Tricuspid valve regurgitation is trivial. No evidence of tricuspid stenosis. Aortic  Valve: The aortic valve is tricuspid. Aortic valve regurgitation is not visualized. Aortic valve sclerosis/calcification is present,  without any evidence of aortic stenosis. Aortic valve peak gradient measures 6.2 mmHg. Pulmonic Valve: The pulmonic valve was normal in structure. Pulmonic valve regurgitation is not visualized. No evidence of pulmonic stenosis. Aorta: The aortic root is normal in size and structure. Venous: The inferior vena cava is normal in size with greater than 50% respiratory variability, suggesting right atrial pressure of 3 mmHg. IAS/Shunts: No atrial level shunt detected by color flow Doppler.  LEFT VENTRICLE PLAX 2D LVOT diam:     1.90 cm   Diastology LV SV:         50        LV e' medial:    4.35 cm/s LV SV Index:   24        LV E/e' medial:  13.2 LVOT Area:     2.84 cm  LV e' lateral:   6.53 cm/s                          LV E/e' lateral: 8.8  RIGHT VENTRICLE             IVC RV S prime:     21.90 cm/s  IVC diam: 1.70 cm TAPSE (M-mode): 1.7 cm LEFT ATRIUM           Index        RIGHT ATRIUM           Index LA Vol (A2C): 61.5 ml 29.24 ml/m  RA Area:     12.70 cm                                    RA Volume:   20.10 ml  9.56 ml/m  AORTIC VALVE AV Area (Vmax): 1.77 cm AV Vmax:        125.00 cm/s AV Peak Grad:   6.2 mmHg LVOT Vmax:      78.10 cm/s LVOT Vmean:     49.800 cm/s LVOT VTI:       0.178 m  AORTA Ao Root diam: 2.90 cm Ao Asc diam:  3.50 cm MITRAL VALVE MV Area (PHT): 2.62 cm    SHUNTS MV Decel Time: 289 msec    Systemic VTI:  0.18 m MV E velocity: 57.60 cm/s  Systemic Diam: 1.90 cm MV A velocity: 83.30 cm/s MV E/A ratio:  0.69 Wilbert Bihari MD Electronically signed by Wilbert Bihari MD Signature Date/Time: 08/01/2024/5:27:37 PM    Final    DG CHEST PORT 1 VIEW Result Date: 08/01/2024 CLINICAL DATA:  Shortness of breath EXAM: PORTABLE CHEST 1 VIEW COMPARISON:  03/31/2014 FINDINGS: Low lung volumes accentuate cardiomediastinal silhouette and pulmonary vascularity. No focal consolidation, pleural effusion, or pneumothorax. No displaced rib fractures. IMPRESSION: Hypoventilation.  No acute cardiopulmonary process.  Electronically Signed   By: Norman Gatlin M.D.   On: 08/01/2024 14:26    Micro Results    Recent Results (from the past 240 hours)  Blood Culture (routine x 2)     Status: None (Preliminary result)   Collection Time: 08/14/24  8:43 PM   Specimen: BLOOD  Result Value Ref Range Status   Specimen Description BLOOD LEFT ANTECUBITAL  Final   Special Requests   Final    BOTTLES DRAWN AEROBIC AND ANAEROBIC Blood Culture adequate volume   Culture  Final    NO GROWTH 4 DAYS Performed at [facility], [address], [place], [place] [address]    Report Status PENDING  Incomplete  Resp panel by RT-PCR (RSV, Flu A&B, Covid) Anterior Nasal Swab     Status: None   Collection Time: 08/14/24  8:43 PM   Specimen: Anterior Nasal Swab  Result Value Ref Range Status   SARS Coronavirus 2 by RT PCR NEGATIVE NEGATIVE Final   Influenza A by PCR NEGATIVE NEGATIVE Final   Influenza B by PCR NEGATIVE NEGATIVE Final    Comment: (NOTE) The Xpert Xpress SARS-CoV-2/FLU/RSV plus assay is intended as an aid in the diagnosis of influenza from Nasopharyngeal swab specimens and should not be used as a sole basis for treatment. Nasal washings and aspirates are unacceptable for Xpert Xpress SARS-CoV-2/FLU/RSV testing.  Fact Sheet for Patients: BloggerCourse.com  Fact Sheet for Healthcare Providers: SeriousBroker.it  This test is not yet approved or cleared by the [place]  FDA and has been authorized for detection and/or diagnosis of SARS-CoV-2 by FDA under an Emergency Use Authorization (EUA). This EUA will remain in effect (meaning this test can be used) for the duration of the COVID-19 declaration under Section 564(b)(1) of the Act, 21 U.S.C. section 360bbb-3(b)(1), unless the authorization is terminated or revoked.     Resp Syncytial Virus by PCR NEGATIVE NEGATIVE Final    Comment: (NOTE) Fact Sheet for  Patients: BloggerCourse.com  Fact Sheet for Healthcare Providers: SeriousBroker.it  This test is not yet approved or cleared by the [place]  FDA and has been authorized for detection and/or diagnosis of SARS-CoV-2 by FDA under an Emergency Use Authorization (EUA). This EUA will remain in effect (meaning this test can be used) for the duration of the COVID-19 declaration under Section 564(b)(1) of the Act, 21 U.S.C. section 360bbb-3(b)(1), unless the authorization is terminated or revoked.  Performed at [facility], [address], [place], [place] [address]   Blood Culture (routine x 2)     Status: None (Preliminary result)   Collection Time: 08/14/24 10:30 PM   Specimen: BLOOD  Result Value Ref Range Status   Specimen Description BLOOD BLOOD LEFT HAND  Final   Special Requests   Final    BOTTLES DRAWN AEROBIC ONLY Blood Culture results may not be optimal due to an inadequate volume of blood received in culture bottles   Culture   Final    NO GROWTH 4 DAYS Performed at [facility], [address], [place], [place] [address]    Report Status PENDING  Incomplete  Aerobic/Anaerobic Culture w Gram Stain (surgical/deep wound)     Status: None (Preliminary result)   Collection Time: 08/15/24 12:01 PM   Specimen: Ear, Left; Abscess  Result Value Ref Range Status   Specimen Description ABSCESS  Final   Special Requests EAR,LEFT  Final   Gram Stain NO WBC SEEN NO ORGANISMS SEEN   Final   Culture   Final    NO GROWTH 3 DAYS NO ANAEROBES ISOLATED; CULTURE IN PROGRESS FOR 5 DAYS Performed at [facility], [address] [place], [place] [address]    Report Status PENDING  Incomplete  MRSA Next Gen by PCR, Nasal     Status: None   Collection Time: 08/15/24  3:15 PM   Specimen: Nasal Mucosa; Nasal Swab  Result Value Ref Range Status   MRSA by PCR Next Gen NOT DETECTED NOT DETECTED Final    Comment:  (  NOTE) The GeneXpert MRSA Assay (FDA approved for NASAL specimens only), is one component of a comprehensive MRSA colonization surveillance program. It is not intended to diagnose MRSA infection nor to guide or monitor treatment for MRSA infections. Test performance is not FDA approved in patients less than 51 years old. Performed at [facility], [address], [place], [place] [address]     Today   Subjective    Beth [name] today has no headache,no chest abdominal pain,no new weakness tingling or numbness, feels much better wants to go home today.    Objective   Blood pressure 119/67, pulse 69, temperature 98.4 F (36.9 C), temperature source Oral, resp. rate (!) 25, height 5' 5 (1.651 m), weight 99.3 kg, SpO2 94%.  No intake or output data in the 24 hours ending 08/19/24 0749  Exam  Awake Alert, No new F.N deficits,    Pillager.AT, left ear canal has dry crusted lesions, no warmth Supple Neck,   Symmetrical Chest wall movement, Good air movement bilaterally, CTAB RRR,No Gallops,   +ve B.Sounds, Abd Soft, Non tender,  No Cyanosis, Clubbing or edema    Data Review   Recent Labs  Lab 08/14/24 2100 08/14/24 2106 08/16/24 0539 08/17/24 0403 08/18/24 0234 08/19/24 0320  WBC 6.7  --  5.6 5.9 6.1 5.8  HGB 11.8* 12.9 10.5* 10.2* 9.6* 9.1*  HCT 38.6 38.0 34.1* 32.8* 30.6* 28.7*  PLT 348  --  339 313 297 304  MCV 86.7  --  85.5 84.8 86.0 84.2  MCH 26.5  --  26.3 26.4 27.0 26.7  MCHC 30.6  --  30.8 31.1 31.4 31.7  RDW 15.9*  --  16.1* 16.3* 16.4* 16.1*  LYMPHSABS 1.4  --  0.6* 0.9 1.1 1.2  MONOABS 0.5  --  0.5 0.7 0.9 0.9  EOSABS 0.3  --  0.2 0.2 0.2 0.2  BASOSABS 0.1  --  0.0 0.1 0.0 0.0    Recent Labs  Lab 08/14/24 2100 08/14/24 2106 08/16/24 0539 08/17/24 0403 08/18/24 0234 08/19/24 0320  NA 138 141 140 140 139 136  K 3.8 4.5 4.1 4.0 4.1 3.8  CL 108 111 108 108 108 105  CO2 17*  --  18* 20* 18* 19*  ANIONGAP 13  --  14 12 13 12   GLUCOSE 175* 174*  120* 128* 114* 126*  BUN 32* 46* 28* 26* 26* 32*  CREATININE 2.03* 2.20* 1.95* 1.93* 2.01* 2.13*  AST 16  --   --   --   --   --   ALT 14  --   --   --   --   --   ALKPHOS 87  --   --   --   --   --   BILITOT 0.9  --   --   --   --   --   ALBUMIN  2.7*  --   --   --   --   --   CRP 0.8  --  2.8* 7.4* 12.4* 13.5*  PROCALCITON  --   --  <0.10 <0.10 <0.10 0.13  LATICACIDVEN  --  1.4  --   --   --   --   MG  --   --  1.9 1.9 1.9 1.9  CALCIUM  8.7*  --  8.7* 8.3* 8.0* 7.8*    Total Time in preparing paper work, data evaluation and todays exam - 35 minutes  Signature  -    Siya Flurry M.D  on 08/19/2024 at 7:49 AM   -  To page go to [email or web address]

## 2024-08-19 NOTE — Care Management Obs Status (Signed)
 MEDICARE OBSERVATION STATUS NOTIFICATION   Patient Details  Name: Beth [name] MRN: [id number] Date of Birth: 15-Mar-1946   Medicare Observation Status Notification Given:       Claretta Deed 08/19/2024, 8:10 AM

## 2024-08-19 NOTE — Plan of Care (Signed)

## 2024-08-20 LAB — AEROBIC/ANAEROBIC CULTURE W GRAM STAIN (SURGICAL/DEEP WOUND)
Culture: NO GROWTH
Gram Stain: NONE SEEN

## 2024-08-21 ENCOUNTER — Encounter (HOSPITAL_COMMUNITY): Payer: Self-pay

## 2024-08-21 ENCOUNTER — Emergency Department (HOSPITAL_COMMUNITY)

## 2024-08-21 ENCOUNTER — Inpatient Hospital Stay (HOSPITAL_COMMUNITY)

## 2024-08-21 ENCOUNTER — Other Ambulatory Visit: Payer: Self-pay

## 2024-08-21 ENCOUNTER — Inpatient Hospital Stay (HOSPITAL_COMMUNITY)
Admission: EM | Admit: 2024-08-21 | Discharge: 2024-09-07 | DRG: 377 | Disposition: E | Source: Skilled Nursing Facility | Attending: Internal Medicine | Admitting: Internal Medicine

## 2024-08-21 DIAGNOSIS — E785 Hyperlipidemia, unspecified: Secondary | ICD-10-CM | POA: Diagnosis present

## 2024-08-21 DIAGNOSIS — M19022 Primary osteoarthritis, left elbow: Secondary | ICD-10-CM | POA: Diagnosis not present

## 2024-08-21 DIAGNOSIS — H6090 Unspecified otitis externa, unspecified ear: Secondary | ICD-10-CM | POA: Diagnosis present

## 2024-08-21 DIAGNOSIS — I5032 Chronic diastolic (congestive) heart failure: Secondary | ICD-10-CM | POA: Diagnosis present

## 2024-08-21 DIAGNOSIS — Z833 Family history of diabetes mellitus: Secondary | ICD-10-CM

## 2024-08-21 DIAGNOSIS — I130 Hypertensive heart and chronic kidney disease with heart failure and stage 1 through stage 4 chronic kidney disease, or unspecified chronic kidney disease: Secondary | ICD-10-CM | POA: Diagnosis present

## 2024-08-21 DIAGNOSIS — Z794 Long term (current) use of insulin: Secondary | ICD-10-CM

## 2024-08-21 DIAGNOSIS — K922 Gastrointestinal hemorrhage, unspecified: Secondary | ICD-10-CM

## 2024-08-21 DIAGNOSIS — N179 Acute kidney failure, unspecified: Secondary | ICD-10-CM | POA: Diagnosis present

## 2024-08-21 DIAGNOSIS — D631 Anemia in chronic kidney disease: Secondary | ICD-10-CM | POA: Diagnosis not present

## 2024-08-21 DIAGNOSIS — E039 Hypothyroidism, unspecified: Secondary | ICD-10-CM | POA: Diagnosis present

## 2024-08-21 DIAGNOSIS — H9202 Otalgia, left ear: Secondary | ICD-10-CM | POA: Diagnosis not present

## 2024-08-21 DIAGNOSIS — R29810 Facial weakness: Secondary | ICD-10-CM | POA: Diagnosis present

## 2024-08-21 DIAGNOSIS — M79602 Pain in left arm: Secondary | ICD-10-CM

## 2024-08-21 DIAGNOSIS — Z79899 Other long term (current) drug therapy: Secondary | ICD-10-CM

## 2024-08-21 DIAGNOSIS — R9431 Abnormal electrocardiogram [ECG] [EKG]: Secondary | ICD-10-CM | POA: Diagnosis not present

## 2024-08-21 DIAGNOSIS — Z7982 Long term (current) use of aspirin: Secondary | ICD-10-CM

## 2024-08-21 DIAGNOSIS — I2489 Other forms of acute ischemic heart disease: Secondary | ICD-10-CM | POA: Diagnosis present

## 2024-08-21 DIAGNOSIS — E872 Acidosis, unspecified: Secondary | ICD-10-CM | POA: Diagnosis present

## 2024-08-21 DIAGNOSIS — N184 Chronic kidney disease, stage 4 (severe): Secondary | ICD-10-CM | POA: Diagnosis present

## 2024-08-21 DIAGNOSIS — I6523 Occlusion and stenosis of bilateral carotid arteries: Secondary | ICD-10-CM | POA: Diagnosis not present

## 2024-08-21 DIAGNOSIS — R1111 Vomiting without nausea: Secondary | ICD-10-CM | POA: Diagnosis not present

## 2024-08-21 DIAGNOSIS — Z515 Encounter for palliative care: Secondary | ICD-10-CM

## 2024-08-21 DIAGNOSIS — K449 Diaphragmatic hernia without obstruction or gangrene: Secondary | ICD-10-CM | POA: Diagnosis not present

## 2024-08-21 DIAGNOSIS — Y831 Surgical operation with implant of artificial internal device as the cause of abnormal reaction of the patient, or of later complication, without mention of misadventure at the time of the procedure: Secondary | ICD-10-CM | POA: Diagnosis present

## 2024-08-21 DIAGNOSIS — R110 Nausea: Secondary | ICD-10-CM | POA: Diagnosis present

## 2024-08-21 DIAGNOSIS — H60502 Unspecified acute noninfective otitis externa, left ear: Secondary | ICD-10-CM | POA: Diagnosis not present

## 2024-08-21 DIAGNOSIS — K5730 Diverticulosis of large intestine without perforation or abscess without bleeding: Secondary | ICD-10-CM | POA: Diagnosis not present

## 2024-08-21 DIAGNOSIS — I69398 Other sequelae of cerebral infarction: Secondary | ICD-10-CM | POA: Diagnosis not present

## 2024-08-21 DIAGNOSIS — G548 Other nerve root and plexus disorders: Secondary | ICD-10-CM | POA: Diagnosis present

## 2024-08-21 DIAGNOSIS — Z7189 Other specified counseling: Secondary | ICD-10-CM | POA: Diagnosis not present

## 2024-08-21 DIAGNOSIS — Z888 Allergy status to other drugs, medicaments and biological substances status: Secondary | ICD-10-CM

## 2024-08-21 DIAGNOSIS — I7090 Unspecified atherosclerosis: Secondary | ICD-10-CM | POA: Diagnosis not present

## 2024-08-21 DIAGNOSIS — H6092 Unspecified otitis externa, left ear: Secondary | ICD-10-CM | POA: Diagnosis present

## 2024-08-21 DIAGNOSIS — E782 Mixed hyperlipidemia: Secondary | ICD-10-CM

## 2024-08-21 DIAGNOSIS — I10 Essential (primary) hypertension: Secondary | ICD-10-CM | POA: Diagnosis present

## 2024-08-21 DIAGNOSIS — R918 Other nonspecific abnormal finding of lung field: Secondary | ICD-10-CM | POA: Diagnosis not present

## 2024-08-21 DIAGNOSIS — I447 Left bundle-branch block, unspecified: Secondary | ICD-10-CM | POA: Diagnosis present

## 2024-08-21 DIAGNOSIS — K2971 Gastritis, unspecified, with bleeding: Principal | ICD-10-CM | POA: Diagnosis present

## 2024-08-21 DIAGNOSIS — I2510 Atherosclerotic heart disease of native coronary artery without angina pectoris: Secondary | ICD-10-CM | POA: Diagnosis not present

## 2024-08-21 DIAGNOSIS — T182XXA Foreign body in stomach, initial encounter: Secondary | ICD-10-CM | POA: Diagnosis not present

## 2024-08-21 DIAGNOSIS — E1122 Type 2 diabetes mellitus with diabetic chronic kidney disease: Secondary | ICD-10-CM | POA: Diagnosis not present

## 2024-08-21 DIAGNOSIS — I5030 Unspecified diastolic (congestive) heart failure: Secondary | ICD-10-CM | POA: Diagnosis present

## 2024-08-21 DIAGNOSIS — E8809 Other disorders of plasma-protein metabolism, not elsewhere classified: Secondary | ICD-10-CM | POA: Diagnosis present

## 2024-08-21 DIAGNOSIS — M00822 Arthritis due to other bacteria, left elbow: Secondary | ICD-10-CM | POA: Diagnosis not present

## 2024-08-21 DIAGNOSIS — Z86711 Personal history of pulmonary embolism: Secondary | ICD-10-CM | POA: Diagnosis present

## 2024-08-21 DIAGNOSIS — E119 Type 2 diabetes mellitus without complications: Secondary | ICD-10-CM

## 2024-08-21 DIAGNOSIS — I517 Cardiomegaly: Secondary | ICD-10-CM | POA: Diagnosis not present

## 2024-08-21 DIAGNOSIS — Z555 Less than a high school diploma: Secondary | ICD-10-CM

## 2024-08-21 DIAGNOSIS — R54 Age-related physical debility: Secondary | ICD-10-CM | POA: Diagnosis present

## 2024-08-21 DIAGNOSIS — Z8249 Family history of ischemic heart disease and other diseases of the circulatory system: Secondary | ICD-10-CM

## 2024-08-21 DIAGNOSIS — D649 Anemia, unspecified: Secondary | ICD-10-CM | POA: Diagnosis not present

## 2024-08-21 DIAGNOSIS — Z5982 Transportation insecurity: Secondary | ICD-10-CM

## 2024-08-21 DIAGNOSIS — N189 Chronic kidney disease, unspecified: Secondary | ICD-10-CM

## 2024-08-21 DIAGNOSIS — T8453XA Infection and inflammatory reaction due to internal right knee prosthesis, initial encounter: Secondary | ICD-10-CM | POA: Diagnosis present

## 2024-08-21 DIAGNOSIS — T8453XS Infection and inflammatory reaction due to internal right knee prosthesis, sequela: Secondary | ICD-10-CM

## 2024-08-21 DIAGNOSIS — G510 Bell's palsy: Secondary | ICD-10-CM

## 2024-08-21 DIAGNOSIS — Z0289 Encounter for other administrative examinations: Secondary | ICD-10-CM | POA: Diagnosis not present

## 2024-08-21 DIAGNOSIS — Z1152 Encounter for screening for COVID-19: Secondary | ICD-10-CM

## 2024-08-21 DIAGNOSIS — Z881 Allergy status to other antibiotic agents status: Secondary | ICD-10-CM

## 2024-08-21 DIAGNOSIS — Z91013 Allergy to seafood: Secondary | ICD-10-CM

## 2024-08-21 DIAGNOSIS — L539 Erythematous condition, unspecified: Secondary | ICD-10-CM | POA: Diagnosis not present

## 2024-08-21 DIAGNOSIS — M25562 Pain in left knee: Secondary | ICD-10-CM | POA: Diagnosis not present

## 2024-08-21 DIAGNOSIS — Z7901 Long term (current) use of anticoagulants: Secondary | ICD-10-CM

## 2024-08-21 DIAGNOSIS — Z66 Do not resuscitate: Secondary | ICD-10-CM | POA: Diagnosis present

## 2024-08-21 DIAGNOSIS — K920 Hematemesis: Secondary | ICD-10-CM | POA: Diagnosis present

## 2024-08-21 DIAGNOSIS — S82001A Unspecified fracture of right patella, initial encounter for closed fracture: Secondary | ICD-10-CM | POA: Diagnosis not present

## 2024-08-21 DIAGNOSIS — R0600 Dyspnea, unspecified: Secondary | ICD-10-CM | POA: Diagnosis not present

## 2024-08-21 DIAGNOSIS — Z96641 Presence of right artificial hip joint: Secondary | ICD-10-CM | POA: Diagnosis not present

## 2024-08-21 DIAGNOSIS — D509 Iron deficiency anemia, unspecified: Secondary | ICD-10-CM | POA: Diagnosis present

## 2024-08-21 DIAGNOSIS — I129 Hypertensive chronic kidney disease with stage 1 through stage 4 chronic kidney disease, or unspecified chronic kidney disease: Secondary | ICD-10-CM | POA: Diagnosis not present

## 2024-08-21 DIAGNOSIS — M25561 Pain in right knee: Secondary | ICD-10-CM

## 2024-08-21 DIAGNOSIS — Z90710 Acquired absence of both cervix and uterus: Secondary | ICD-10-CM

## 2024-08-21 DIAGNOSIS — M109 Gout, unspecified: Secondary | ICD-10-CM | POA: Diagnosis present

## 2024-08-21 DIAGNOSIS — I700 Atherosclerosis of aorta: Secondary | ICD-10-CM | POA: Diagnosis not present

## 2024-08-21 DIAGNOSIS — M1712 Unilateral primary osteoarthritis, left knee: Secondary | ICD-10-CM | POA: Diagnosis not present

## 2024-08-21 DIAGNOSIS — Z8041 Family history of malignant neoplasm of ovary: Secondary | ICD-10-CM

## 2024-08-21 DIAGNOSIS — E669 Obesity, unspecified: Secondary | ICD-10-CM | POA: Diagnosis not present

## 2024-08-21 DIAGNOSIS — Z96651 Presence of right artificial knee joint: Secondary | ICD-10-CM | POA: Diagnosis not present

## 2024-08-21 DIAGNOSIS — H66002 Acute suppurative otitis media without spontaneous rupture of ear drum, left ear: Secondary | ICD-10-CM | POA: Diagnosis present

## 2024-08-21 DIAGNOSIS — R7989 Other specified abnormal findings of blood chemistry: Secondary | ICD-10-CM | POA: Diagnosis present

## 2024-08-21 DIAGNOSIS — M25422 Effusion, left elbow: Secondary | ICD-10-CM | POA: Diagnosis not present

## 2024-08-21 DIAGNOSIS — L03114 Cellulitis of left upper limb: Secondary | ICD-10-CM | POA: Diagnosis not present

## 2024-08-21 DIAGNOSIS — K2950 Unspecified chronic gastritis without bleeding: Secondary | ICD-10-CM | POA: Diagnosis not present

## 2024-08-21 DIAGNOSIS — E871 Hypo-osmolality and hyponatremia: Secondary | ICD-10-CM | POA: Diagnosis present

## 2024-08-21 DIAGNOSIS — Z7983 Long term (current) use of bisphosphonates: Secondary | ICD-10-CM

## 2024-08-21 DIAGNOSIS — R1013 Epigastric pain: Secondary | ICD-10-CM | POA: Diagnosis not present

## 2024-08-21 DIAGNOSIS — Z87891 Personal history of nicotine dependence: Secondary | ICD-10-CM

## 2024-08-21 DIAGNOSIS — S72401A Unspecified fracture of lower end of right femur, initial encounter for closed fracture: Secondary | ICD-10-CM | POA: Diagnosis not present

## 2024-08-21 DIAGNOSIS — Z6836 Body mass index (BMI) 36.0-36.9, adult: Secondary | ICD-10-CM

## 2024-08-21 DIAGNOSIS — R600 Localized edema: Secondary | ICD-10-CM | POA: Diagnosis not present

## 2024-08-21 DIAGNOSIS — Z7984 Long term (current) use of oral hypoglycemic drugs: Secondary | ICD-10-CM

## 2024-08-21 DIAGNOSIS — J9601 Acute respiratory failure with hypoxia: Secondary | ICD-10-CM | POA: Diagnosis not present

## 2024-08-21 DIAGNOSIS — M009 Pyogenic arthritis, unspecified: Secondary | ICD-10-CM | POA: Diagnosis not present

## 2024-08-21 DIAGNOSIS — G8929 Other chronic pain: Secondary | ICD-10-CM | POA: Diagnosis present

## 2024-08-21 DIAGNOSIS — R1084 Generalized abdominal pain: Secondary | ICD-10-CM | POA: Diagnosis not present

## 2024-08-21 DIAGNOSIS — Z87898 Personal history of other specified conditions: Secondary | ICD-10-CM | POA: Diagnosis not present

## 2024-08-21 DIAGNOSIS — S82201A Unspecified fracture of shaft of right tibia, initial encounter for closed fracture: Secondary | ICD-10-CM | POA: Diagnosis not present

## 2024-08-21 DIAGNOSIS — Z886 Allergy status to analgesic agent status: Secondary | ICD-10-CM

## 2024-08-21 DIAGNOSIS — S82101A Unspecified fracture of upper end of right tibia, initial encounter for closed fracture: Secondary | ICD-10-CM | POA: Diagnosis not present

## 2024-08-21 DIAGNOSIS — Z86718 Personal history of other venous thrombosis and embolism: Secondary | ICD-10-CM

## 2024-08-21 LAB — CBG MONITORING, ED: Glucose-Capillary: 119 mg/dL — ABNORMAL HIGH (ref 70–99)

## 2024-08-21 LAB — CBC WITH DIFFERENTIAL/PLATELET
Abs Immature Granulocytes: 0.03 K/uL (ref 0.00–0.07)
Basophils Absolute: 0 K/uL (ref 0.0–0.1)
Basophils Relative: 1 %
Eosinophils Absolute: 0.1 K/uL (ref 0.0–0.5)
Eosinophils Relative: 1 %
HCT: 32.6 % — ABNORMAL LOW (ref 36.0–46.0)
Hemoglobin: 10.2 g/dL — ABNORMAL LOW (ref 12.0–15.0)
Immature Granulocytes: 0 %
Lymphocytes Relative: 19 %
Lymphs Abs: 1.5 K/uL (ref 0.7–4.0)
MCH: 26.8 pg (ref 26.0–34.0)
MCHC: 31.3 g/dL (ref 30.0–36.0)
MCV: 85.8 fL (ref 80.0–100.0)
Monocytes Absolute: 1 K/uL (ref 0.1–1.0)
Monocytes Relative: 13 %
Neutro Abs: 5.4 K/uL (ref 1.7–7.7)
Neutrophils Relative %: 66 %
Platelets: 398 K/uL (ref 150–400)
RBC: 3.8 MIL/uL — ABNORMAL LOW (ref 3.87–5.11)
RDW: 16 % — ABNORMAL HIGH (ref 11.5–15.5)
WBC: 8.1 K/uL (ref 4.0–10.5)
nRBC: 0 % (ref 0.0–0.2)

## 2024-08-21 LAB — RESP PANEL BY RT-PCR (RSV, FLU A&B, COVID)  RVPGX2
Influenza A by PCR: NEGATIVE
Influenza B by PCR: NEGATIVE
Resp Syncytial Virus by PCR: NEGATIVE
SARS Coronavirus 2 by RT PCR: NEGATIVE

## 2024-08-21 LAB — COMPREHENSIVE METABOLIC PANEL WITH GFR
ALT: 16 U/L (ref 0–44)
AST: 21 U/L (ref 15–41)
Albumin: 2.2 g/dL — ABNORMAL LOW (ref 3.5–5.0)
Alkaline Phosphatase: 84 U/L (ref 38–126)
Anion gap: 13 (ref 5–15)
BUN: 29 mg/dL — ABNORMAL HIGH (ref 8–23)
CO2: 20 mmol/L — ABNORMAL LOW (ref 22–32)
Calcium: 8.3 mg/dL — ABNORMAL LOW (ref 8.9–10.3)
Chloride: 104 mmol/L (ref 98–111)
Creatinine, Ser: 2.13 mg/dL — ABNORMAL HIGH (ref 0.44–1.00)
GFR, Estimated: 23 mL/min — ABNORMAL LOW (ref >60)
Glucose, Bld: 132 mg/dL — ABNORMAL HIGH (ref 70–99)
Potassium: 3.8 mmol/L (ref 3.5–5.1)
Sodium: 137 mmol/L (ref 135–145)
Total Bilirubin: 1 mg/dL (ref 0.0–1.2)
Total Protein: 6.9 g/dL (ref 6.5–8.1)

## 2024-08-21 LAB — TYPE AND SCREEN
ABO/RH(D): O POS
Antibody Screen: NEGATIVE

## 2024-08-21 LAB — PROTIME-INR
INR: 1.9 — ABNORMAL HIGH (ref 0.8–1.2)
Prothrombin Time: 22.3 s — ABNORMAL HIGH (ref 11.4–15.2)

## 2024-08-21 LAB — LIPASE, BLOOD: Lipase: 42 U/L (ref 11–51)

## 2024-08-21 MED ORDER — SODIUM CHLORIDE 0.9 % IV SOLN: INTRAVENOUS | Status: AC

## 2024-08-21 MED ORDER — CIPROFLOXACIN-DEXAMETHASONE 0.3-0.1 % OT SUSP
4.0000 [drp] | Freq: Two times a day (BID) | OTIC | Status: DC
  Administered 2024-08-21 – 2024-09-01 (×21): 4 [drp] via OTIC
  Filled 2024-08-21 (×3): qty 7.5

## 2024-08-21 MED ORDER — HYDROCODONE-ACETAMINOPHEN 5-325 MG PO TABS
1.0000 | ORAL_TABLET | ORAL | Status: DC | PRN
  Administered 2024-08-22 – 2024-08-23 (×3): 2 via ORAL
  Administered 2024-08-25 – 2024-08-29 (×3): 1 via ORAL
  Administered 2024-08-30 – 2024-09-02 (×8): 2 via ORAL
  Filled 2024-08-21 (×6): qty 2
  Filled 2024-08-21: qty 1
  Filled 2024-08-21: qty 2
  Filled 2024-08-21: qty 1
  Filled 2024-08-21 (×5): qty 2
  Filled 2024-08-21: qty 1

## 2024-08-21 MED ORDER — CEFTRIAXONE SODIUM 1 G IJ SOLR
1.0000 g | Freq: Once | INTRAMUSCULAR | Status: AC
  Administered 2024-08-21: 1 g via INTRAVENOUS
  Filled 2024-08-21: qty 10

## 2024-08-21 MED ORDER — MORPHINE SULFATE (PF) 4 MG/ML IV SOLN
4.0000 mg | Freq: Once | INTRAVENOUS | Status: AC
  Administered 2024-08-21: 4 mg via INTRAVENOUS
  Filled 2024-08-21: qty 1

## 2024-08-21 MED ORDER — HYDRALAZINE HCL 20 MG/ML IJ SOLN
10.0000 mg | INTRAMUSCULAR | Status: DC | PRN
  Administered 2024-08-21 – 2024-08-24 (×3): 10 mg via INTRAVENOUS
  Filled 2024-08-21 (×2): qty 1

## 2024-08-21 MED ORDER — DILTIAZEM HCL ER COATED BEADS 240 MG PO CP24
240.0000 mg | ORAL_CAPSULE | Freq: Every day | ORAL | Status: DC
Start: 2024-08-22 — End: 2024-09-02
  Administered 2024-08-23 – 2024-09-01 (×10): 240 mg via ORAL
  Filled 2024-08-21 (×11): qty 1

## 2024-08-21 MED ORDER — PANTOPRAZOLE SODIUM 40 MG IV SOLR
40.0000 mg | Freq: Two times a day (BID) | INTRAVENOUS | Status: DC
  Administered 2024-08-21: 40 mg via INTRAVENOUS
  Filled 2024-08-21: qty 10

## 2024-08-21 MED ORDER — VANCOMYCIN HCL 1500 MG/300ML IV SOLN
1500.0000 mg | Freq: Once | INTRAVENOUS | Status: AC
Start: 2024-08-21 — End: 2024-08-21
  Administered 2024-08-21: 1500 mg via INTRAVENOUS
  Filled 2024-08-21: qty 300

## 2024-08-21 MED ORDER — FENTANYL CITRATE PF 50 MCG/ML IJ SOSY
12.5000 ug | PREFILLED_SYRINGE | INTRAMUSCULAR | Status: DC | PRN
  Administered 2024-08-28 – 2024-09-02 (×6): 50 ug via INTRAVENOUS
  Filled 2024-08-21 (×6): qty 1

## 2024-08-21 MED ORDER — INSULIN ASPART 100 UNIT/ML IJ SOLN
0.0000 [IU] | INTRAMUSCULAR | Status: DC
  Administered 2024-08-22 (×2): 1 [IU] via SUBCUTANEOUS
  Administered 2024-08-23: 2 [IU] via SUBCUTANEOUS
  Administered 2024-08-23 – 2024-08-24 (×4): 1 [IU] via SUBCUTANEOUS
  Administered 2024-08-24: 2 [IU] via SUBCUTANEOUS
  Administered 2024-08-25 – 2024-08-26 (×2): 1 [IU] via SUBCUTANEOUS
  Administered 2024-08-26: 3 [IU] via SUBCUTANEOUS
  Administered 2024-08-27 – 2024-08-28 (×3): 1 [IU] via SUBCUTANEOUS
  Administered 2024-08-28: 2 [IU] via SUBCUTANEOUS
  Administered 2024-08-28: 1 [IU] via SUBCUTANEOUS
  Administered 2024-08-29 – 2024-08-30 (×2): 2 [IU] via SUBCUTANEOUS
  Administered 2024-08-30: 3 [IU] via SUBCUTANEOUS
  Administered 2024-08-30 – 2024-08-31 (×4): 2 [IU] via SUBCUTANEOUS

## 2024-08-21 MED ORDER — ACETAMINOPHEN 650 MG RE SUPP: 650.0000 mg | Freq: Four times a day (QID) | RECTAL | Status: DC | PRN

## 2024-08-21 MED ORDER — FAMOTIDINE IN NACL 20-0.9 MG/50ML-% IV SOLN
20.0000 mg | Freq: Once | INTRAVENOUS | Status: AC
  Administered 2024-08-21: 20 mg via INTRAVENOUS
  Filled 2024-08-21: qty 50

## 2024-08-21 MED ORDER — SODIUM CHLORIDE 0.9 % IV BOLUS
1000.0000 mL | Freq: Once | INTRAVENOUS | Status: AC
  Administered 2024-08-21: 1000 mL via INTRAVENOUS

## 2024-08-21 MED ORDER — ACETAMINOPHEN 325 MG PO TABS
650.0000 mg | ORAL_TABLET | Freq: Four times a day (QID) | ORAL | Status: DC | PRN
  Administered 2024-08-24 – 2024-08-28 (×3): 650 mg via ORAL
  Filled 2024-08-21 (×3): qty 2

## 2024-08-21 MED ORDER — ATORVASTATIN CALCIUM 40 MG PO TABS
40.0000 mg | ORAL_TABLET | Freq: Every day | ORAL | Status: DC
Start: 2024-08-21 — End: 2024-09-02
  Administered 2024-08-21 – 2024-09-01 (×12): 40 mg via ORAL
  Filled 2024-08-21 (×12): qty 1

## 2024-08-21 MED ORDER — ONDANSETRON HCL 4 MG/2ML IJ SOLN
4.0000 mg | Freq: Once | INTRAMUSCULAR | Status: AC
  Administered 2024-08-21: 4 mg via INTRAVENOUS
  Filled 2024-08-21: qty 2

## 2024-08-21 MED ORDER — SODIUM CHLORIDE 0.9 % IV SOLN
50.0000 ug/h | INTRAVENOUS | Status: DC
  Administered 2024-08-21 – 2024-08-22 (×2): 50 ug/h via INTRAVENOUS
  Filled 2024-08-21 (×3): qty 1

## 2024-08-21 MED ORDER — HYDRALAZINE HCL 20 MG/ML IJ SOLN
INTRAMUSCULAR | Status: AC
  Administered 2024-08-22: 10 mg via INTRAVENOUS
  Filled 2024-08-21: qty 1

## 2024-08-21 MED ORDER — SODIUM CHLORIDE 0.9 % IV SOLN
2.0000 g | INTRAVENOUS | Status: DC
  Administered 2024-08-22 – 2024-08-28 (×7): 2 g via INTRAVENOUS
  Filled 2024-08-21 (×7): qty 12.5

## 2024-08-21 MED ORDER — VANCOMYCIN HCL IN DEXTROSE 1-5 GM/200ML-% IV SOLN
1000.0000 mg | INTRAVENOUS | Status: DC
  Administered 2024-08-23 – 2024-08-27 (×3): 1000 mg via INTRAVENOUS
  Filled 2024-08-21 (×5): qty 200

## 2024-08-21 MED ORDER — OXYCODONE HCL 5 MG PO TABS
5.0000 mg | ORAL_TABLET | Freq: Once | ORAL | Status: AC
  Administered 2024-08-21: 5 mg via ORAL
  Filled 2024-08-21: qty 1

## 2024-08-21 MED ORDER — SODIUM CHLORIDE 0.9 % IV SOLN
2.0000 g | Freq: Once | INTRAVENOUS | Status: AC
  Administered 2024-08-21: 2 g via INTRAVENOUS
  Filled 2024-08-21: qty 12.5

## 2024-08-21 MED ORDER — HYDROMORPHONE HCL 1 MG/ML IJ SOLN
0.5000 mg | Freq: Once | INTRAMUSCULAR | Status: AC
  Administered 2024-08-21: 0.5 mg via INTRAVENOUS
  Filled 2024-08-21: qty 1

## 2024-08-21 MED ORDER — VALACYCLOVIR HCL 500 MG PO TABS
500.0000 mg | ORAL_TABLET | Freq: Two times a day (BID) | ORAL | Status: DC
  Administered 2024-08-21 – 2024-08-24 (×6): 500 mg via ORAL
  Filled 2024-08-21 (×9): qty 1

## 2024-08-21 MED ORDER — PANTOPRAZOLE SODIUM 40 MG IV SOLR
80.0000 mg | Freq: Once | INTRAVENOUS | Status: AC
  Administered 2024-08-21: 80 mg via INTRAVENOUS
  Filled 2024-08-21: qty 20

## 2024-08-21 MED ORDER — OCTREOTIDE LOAD VIA INFUSION
50.0000 ug | Freq: Once | INTRAVENOUS | Status: AC
  Administered 2024-08-21: 50 ug via INTRAVENOUS
  Filled 2024-08-21: qty 25

## 2024-08-21 NOTE — Assessment & Plan Note (Signed)
 -  Chronic unchanged.

## 2024-08-21 NOTE — Assessment & Plan Note (Signed)
-   Glasgow Blatchford score BUN >18.2  Hg  <62F   >1 Justifies admission and aggressive management      Modifying risk factors include:     NSAIDS use  anticoagulation,         -    AIMS 65 = Alb <3,  INR >1.5        -   Admit to progressive given above    -  ER  Provider spoke to gastroenterology ( [facility]) they will see patient in a.m. appreciate their consult   - serial CBC.    - Monitor for any recurrence,  evidence of hemodynamic instability or significant blood loss  - Transfuse as needed for hemoglobin below 7 or evidence of life-threatening bleeding  - Establish at least 2 PIV and fluid resuscitate patient is a difficult stick discussed case with IR will attempt to leave triple-lumen tomorrow after procedure  - clear liquids for tonight keep nothing by mouth post midnight,   -  administer Protonix    twice a day     - ER provider Administered octreotide

## 2024-08-21 NOTE — Assessment & Plan Note (Signed)
 Continue ear drops

## 2024-08-21 NOTE — Assessment & Plan Note (Signed)
Continue Lipitor 40 mg a day

## 2024-08-21 NOTE — ED Notes (Signed)
IV team nurses at bedside.

## 2024-08-21 NOTE — Assessment & Plan Note (Signed)
-  will monitor on tele avoid QT prolonging medications, rehydrate correct electrolytes ? ?

## 2024-08-21 NOTE — Assessment & Plan Note (Signed)
 Chronic stable diastolic CHF Avoid fluid overload

## 2024-08-21 NOTE — Assessment & Plan Note (Signed)
-  chronic avoid nephrotoxic medications such as NSAIDs, Vanco Zosyn combo,  avoid hypotension, continue to follow renal function

## 2024-08-21 NOTE — Subjective & Objective (Signed)
 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 Initial blood pressure 182/110 Noted epigastric abd pain  with n/v  Hg 10.2 INR 1.9 Pt has been recently admitted level hospitalized  There was some suspicion wants   of left arm cellulitis she was covered with rocephin   While in ER pt noted intermittent LBBB on telemetry Er discussed with cardiology who felt it was rate dependent block

## 2024-08-21 NOTE — Progress Notes (Signed)
 VASCULAR LAB    Left upper extremity venous duplex has been performed.  See CV proc for preliminary results.   Tuesday Terlecki, RVT 08/21/2024, 4:58 PM

## 2024-08-21 NOTE — Consult Note (Addendum)
 Consultation  Referring Provider: ER MD/Gray Primary Care Physician:  Zollie Lowers, MD Primary Gastroenterologist:  Dr. Victorine  Reason for Consultation: Hematemesis  HPI: Beth [name] is a 78 y.o. female with history of chronic kidney disease stage III, hypertension, hyperlipidemia, CVA, history of PE for which she is on chronic Eliquis  who was just discharged from the hospital on 08/19/2024.  She was here for 5 days with primary complaint of left ear pain and family was concerned about facial droop on the left.  She was diagnosed with left otitis externa and otitis media with some compression of the left facial nerve, she had also recently had a left ear herpetic breakout.  She required myringotomy per ENT.  She was discharged on Augmentin  and valacyclovir .. Patient was brought back to the emergency room this afternoon after she had vomited several times at her nursing facility.  This was described as dark red blood.  When she arrived to the emergency room she had dried blood on her face.  Patient says she vomited about 4 or 5 times.  She has not had any melena or hematochezia.  She has no complaints of abdominal pain but says she has been very nauseated and that had started when she was in the hospital last time.  This is the first time she vomited. Last dose of Eliquis  earlier today She did not syncopized. On arrival to the ER actually hypertensive with blood pressure 182/110  Labs in the ER-WBC 8.1/hemoglobin 10.2/hematocrit 32.6/MCV 85/platelets 398 Pro time 22.3/INR 1.9 Lipase within normal limits Sodium 137/potassium 3.8/BUN 29/creatinine 2.13 LFTs normal Respiratory panel negative Troponin pending  Patient's hemoglobin on discharge from hospital on 08/19/2024 was 9.1  Patient has been having fairly long runs of what appears to be V. tach, has maintained pulse and blood pressure and per ER MD has a bundle branch block which is giving appearance of V. tach.  She is being  monitored  She had films of her right knee as she had expressed that her knee was hurting when they were moving her around, she has a brace on her knee that she wears chronically.  She has a fractured intramedullary fixation rod traversing the knee joint with posterior displacement of the distal fracture component, lucency along the cement bone interface cannot exclude osteomyelitis  Patient's son who is present in the room says that he was with her earlier this morning and she was feeling fine, he had come to visit for the weekend and was leaving to go back home to [place] when they were called about the hematemesis.  He relates that she has had 7 surgeries on that knee in the past and has voiced that she will not have any more surgeries on that knee  She has not had any prior issues with GI bleeding, no prior EGDs.  She did have previous prior colonoscopies, last in 2015 with finding of diverticulosis, no recurrent colon polyps.  She has also been on Fosamax .    Past Medical History:  Diagnosis Date   Anemia    Arthritis    Knee both knees   Blood transfusion without reported diagnosis 2012   anemia;pt denies transfusion stated was only on iron  tablet   Cataract    left   CKD (chronic kidney disease), stage III (HCC)    Diabetes mellitus without complication (HCC)    Family history of anesthesia complication    sister very slow to awaken after anesthesia;severe vomiting    Gout  left elbow   Herpes infection 08/09/2014   Saw doctor Wed. 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   Other acute osteomyelitis, right femur (HCC) 07/23/2020   Pseudomonas aeruginosa infection 12/15/2018   Pseudomonas aeruginosa infection 12/15/2018   Septic arthritis of knee, right  (HCC) 08/27/2020   Stroke (HCC) 2006   x 1 no deficits noted     Past Surgical History:  Procedure Laterality Date   ABDOMINAL HYSTERECTOMY  1983   CARPAL TUNNEL RELEASE Right 1983   colonscopy  June 21, 2014   EXCISIONAL TOTAL KNEE ARTHROPLASTY WITH ANTIBIOTIC SPACERS Right 02/16/2015   Procedure: RIGHT KNEE RESECTION ARTHROPLASTY WITH ANTIBIOTIC SPACERS;  Surgeon: Dempsey Moan, MD;  Location: [facility];  Service: Orthopedics;  Laterality: Right;   EXCISIONAL TOTAL KNEE ARTHROPLASTY WITH ANTIBIOTIC SPACERS Right 11/03/2018   Procedure: Right knee resection arthroplasty; antibiotic spacer;  Surgeon: Moan Dempsey, MD;  Location: [facility];  Service: Orthopedics;  Laterality: Right;  Adductor Block   I & D KNEE WITH POLY EXCHANGE Right 10/02/2014   Procedure: IRRIGATION AND DEBRIDEMENT RIGHT KNEE WITH POLY EXCHANGE;  Surgeon: Dempsey Moan GAILS, MD;  Location: [facility];  Service: Orthopedics;  Laterality: Right;   I & D KNEE WITH POLY EXCHANGE Right 08/27/2020   Procedure: IRRIGATION AND DEBRIDEMENT; SPACER EXCHANGE RIGHT KNEE with multiple specimens;  Surgeon: Moan Dempsey, MD;  Location: [facility];  Service: Orthopedics;  Laterality: Right;    INCISION AND DRAINAGE OF WOUND Right 01/14/2017   Procedure: IRRIGATION AND DEBRIDEMENT WOUND;  Surgeon: Dempsey Moan, MD;  Location: [facility];  Service: Orthopedics;  Laterality: Right;  requests   JOINT REPLACEMENT  06/2014   right knee   MYRINGOTOMY WITH TUBE PLACEMENT Left 08/15/2024   Procedure: MYRINGOTOMY WITH LEFT EAR TUBE PLACEMENT;  Surgeon: Roark Rush, MD;  Location[facility];  Service: ENT;  Laterality: Left;   nasal cauterization  2012   PATELLAR TENDON REPAIR Right 08/11/2014   Procedure: RIGHT PATELLA TENDON REPAIR;  Surgeon: Dempsey Moan GAILS, MD;  Location: [facility];  Service: Orthopedics;  Laterality: Right;   REIMPLANTATION OF TOTAL KNEE Right 05/23/2015   Procedure: RIGHT KNEE ARTHROPLASTY REIMPLANTATION;  Surgeon: Dempsey Moan, MD;   Location: [facility];  Service: Orthopedics;  Laterality: Right;   TOTAL KNEE ARTHROPLASTY Right 07/03/2014   Procedure: RIGHT TOTAL KNEE ARTHROPLASTY;  Surgeon: Dempsey Moan GAILS, MD;  Location: [facility];  Service: Orthopedics;  Laterality: Right;   TUBAL LIGATION      Prior to Admission medications   Medication Sig Start Date End Date Taking? Authorizing Provider  alendronate  (FOSAMAX ) 70 MG tablet TAKE 1 TABLET WEEKLY (TAKE WITH 8OZ OF WATER  30 MINUTES BEFORE BREAKFAST) Patient taking differently: Take 70 mg by mouth every Tuesday. 05/16/24  Yes Zollie Lowers, MD  amoxicillin -clavulanate (AUGMENTIN ) 875-125 MG tablet Take 1 tablet by mouth 2 (two) times daily. 08/19/24  Yes Dennise Lavada POUR, MD  apixaban  (ELIQUIS ) 5 MG TABS tablet Take 2 tablets (10 mg total) by mouth 2 (two) times daily for 7 days, THEN 1 tablet (5 mg total) 2 (two) times daily. Patient taking differently: Take 1 tablet (5mg ) by mouth twice daily. 08/07/24 09/13/24 Yes Darci Pore, MD  aspirin  EC 81 MG tablet Take 1 tablet (81  mg total) by mouth daily. Swallow whole. 08/08/24  Yes Darci Pore, MD  atorvastatin  (LIPITOR ) 40 MG tablet Take 40 mg by mouth at bedtime. 08/08/24  Yes [provider]  azelastine  (ASTELIN ) 0.1 % nasal spray Place 1 spray into both nostrils 2 (two) times daily. Use in each nostril as directed 07/26/24  Yes [facility] Morton Hummer, Nena, NP  ciprofloxacin -dexamethasone  (CIPRODEX ) OTIC suspension Place 4 drops into the left ear 2 (two) times daily. 08/07/24  Yes Darci Pore, MD  colchicine  0.6 MG tablet Take twice daily for gout attack. (may take every two hours up to 6 doses at acute onset) Patient taking differently: Take 0.6 mg by mouth 2 (two) times daily. 03/03/24  Yes Zollie Lowers, MD  diltiazem  (CARDIZEM  CD) 240 MG 24 hr capsule TAKE ONE CAPSULE BY MOUTH DAILY 05/16/24  Yes Zollie Lowers, MD  empagliflozin  (JARDIANCE ) 10 MG TABS tablet Take 1 tablet (10 mg total) by mouth daily.  09/29/23  Yes Zollie Lowers, MD  ferrous sulfate  325 (65 FE) MG tablet Take 325 mg by mouth 2 (two) times a week. Take one tablet by mouth on Tue & Thurs.   Yes [provider]  hydrALAZINE  (APRESOLINE ) 25 MG tablet TAKE ONE (1) TABLET BY MOUTH 3 TIMES DAILY 07/18/24  Yes Zollie Lowers, MD  HYDROcodone -acetaminophen  (NORCO/VICODIN) 5-325 MG tablet Take 1 tablet by mouth every 6 (six) hours as needed for moderate pain (pain score 4-6). 08/19/24  Yes Singh, Prashant K, MD  hydrocortisone  cream 1 % Apply topically 2 (two) times daily. To left ear 08/19/24  Yes Dennise Lavada POUR, MD  isosorbide  mononitrate (IMDUR ) 60 MG 24 hr tablet Take 1 tablet (60 mg total) by mouth daily. 08/08/24  Yes Darci Pore, MD  ondansetron  (ZOFRAN -ODT) 4 MG disintegrating tablet Take 4 mg by mouth every 8 (eight) hours as needed for vomiting or nausea. 08/08/24  Yes [provider]  valACYclovir  (VALTREX ) 500 MG tablet Take 1 tablet (500 mg total) by mouth 2 (two) times daily. Patient not taking: Reported on 08/21/2024 08/19/24   Singh, Prashant K, MD    Current Facility-Administered Medications  Medication Dose Route Frequency Provider Last Rate Last Admin   octreotide  (SANDOSTATIN ) 500 mcg in sodium chloride  0.9 % 250 mL (2 mcg/mL) infusion  50 mcg/hr Intravenous Continuous Elnor Savant A, DO 25 mL/hr at 08/21/24 1620 50 mcg/hr at 08/21/24 1620   pantoprazole  (PROTONIX ) injection 40 mg  40 mg Intravenous Q12H Esterwood, Amy S, PA-C       Current Outpatient Medications  Medication Sig Dispense Refill   alendronate  (FOSAMAX ) 70 MG tablet TAKE 1 TABLET WEEKLY (TAKE WITH 8OZ OF WATER  30 MINUTES BEFORE BREAKFAST) (Patient taking differently: Take 70 mg by mouth every Tuesday.) 12 tablet 1   amoxicillin -clavulanate (AUGMENTIN ) 875-125 MG tablet Take 1 tablet by mouth 2 (two) times daily.     apixaban  (ELIQUIS ) 5 MG TABS tablet Take 2 tablets (10 mg total) by mouth 2 (two) times daily for 7 days, THEN 1  tablet (5 mg total) 2 (two) times daily. (Patient taking differently: Take 1 tablet (5mg ) by mouth twice daily.) 60 tablet 2   aspirin  EC 81 MG tablet Take 1 tablet (81 mg total) by mouth daily. Swallow whole. 30 tablet 1   atorvastatin  (LIPITOR ) 40 MG tablet Take 40 mg by mouth at bedtime.     azelastine  (ASTELIN ) 0.1 % nasal spray Place 1 spray into both nostrils 2 (two) times daily. Use in each nostril as directed 30  mL 12   ciprofloxacin -dexamethasone  (CIPRODEX ) OTIC suspension Place 4 drops into the left ear 2 (two) times daily. 7.5 mL 0   colchicine  0.6 MG tablet Take twice daily for gout attack. (may take every two hours up to 6 doses at acute onset) (Patient taking differently: Take 0.6 mg by mouth 2 (two) times daily.) 60 tablet 2   diltiazem  (CARDIZEM  CD) 240 MG 24 hr capsule TAKE ONE CAPSULE BY MOUTH DAILY 90 capsule 1   empagliflozin  (JARDIANCE ) 10 MG TABS tablet Take 1 tablet (10 mg total) by mouth daily. 90 tablet 3   ferrous sulfate  325 (65 FE) MG tablet Take 325 mg by mouth 2 (two) times a week. Take one tablet by mouth on Tue & Thurs.     hydrALAZINE  (APRESOLINE ) 25 MG tablet TAKE ONE (1) TABLET BY MOUTH 3 TIMES DAILY 90 tablet 0   HYDROcodone -acetaminophen  (NORCO/VICODIN) 5-325 MG tablet Take 1 tablet by mouth every 6 (six) hours as needed for moderate pain (pain score 4-6). 5 tablet 0   hydrocortisone  cream 1 % Apply topically 2 (two) times daily. To left ear 30 g 0   isosorbide  mononitrate (IMDUR ) 60 MG 24 hr tablet Take 1 tablet (60 mg total) by mouth daily. 30 tablet 1   ondansetron  (ZOFRAN -ODT) 4 MG disintegrating tablet Take 4 mg by mouth every 8 (eight) hours as needed for vomiting or nausea.     valACYclovir  (VALTREX ) 500 MG tablet Take 1 tablet (500 mg total) by mouth 2 (two) times daily. (Patient not taking: Reported on 08/21/2024)      Allergies as of 08/21/2024 - Review Complete 08/21/2024  Allergen Reaction Noted   Cefdinir Swelling and Rash 07/25/2024   Aleve  [naproxen sodium] Other (See Comments) 02/06/2015   Dorethia Numbers ] Other (See Comments) 02/28/2016   Ciprofloxacin  Other (See Comments) 08/22/2019   Clonidine  derivatives Other (See Comments) 09/22/2013   Shellfish allergy Nausea And Vomiting 06/16/2014    Family History  Problem Relation Age of Onset   Ovarian cancer Mother    Cancer Mother    Peripheral vascular disease Father        with amputation of both legs   Hypertension Father    Heart disease Brother 79   Kidney disease Daughter    Heart disease Daughter 80   Diabetes Son    Diabetes Son    Colon cancer Neg Hx    Esophageal cancer Neg Hx    Stomach cancer Neg Hx    Rectal cancer Neg Hx     Social History   Socioeconomic History   Marital status: Widowed    Spouse name: Not on file   Number of children: 9   Years of education: 8th   Highest education level: 8th grade  Occupational History    Employer: RETIRED  Tobacco Use   Smoking status: Never   Smokeless tobacco: Never  Vaping Use   Vaping status: Never Used  Substance and Sexual Activity   Alcohol use: No   Drug use: No   Sexual activity: Not Currently  Other Topics Concern   Not on file  Social History Narrative   She has 6 living adult children and 3 who have passed away and many grandchildren and great grandchildren. Patient is retired. She worked part time Education officer, environmental houses and a Research officer, trade union.    One child in [place], others in [place], [place], [place], and [place]   Social Drivers of Health   Financial Resource Strain: Low Risk  (07/20/2024)  Received from [facility]   Overall Financial Resource Strain (CARDIA)    How hard is it for you to pay for the very basics like food, housing, medical care, and heating?: Not hard at all  Food Insecurity: No Food Insecurity (08/15/2024)   Hunger Vital Sign    Worried About Running Out of Food in the Last Year: Never true    Ran Out of Food in the Last Year: Never true  Transportation Needs: No Transportation  Needs (08/15/2024)   PRAPARE - Administrator, Civil Service (Medical): No    Lack of Transportation (Non-Medical): No  Recent Concern: Transportation Needs - Unmet Transportation Needs (07/20/2024)   Received from [facility] - Transportation    Lack of Transportation (Medical): Yes    Lack of Transportation (Non-Medical): Yes  Physical Activity: Inactive (07/20/2024)   Received from [facility]   Exercise Vital Sign    On average, how many days per week do you engage in moderate to strenuous exercise (like a brisk walk)?: 0 days    On average, how many minutes do you engage in exercise at this level?: 0 min  Stress: No Stress Concern Present (07/20/2024)   Received from [facility] - Occupational Stress Questionnaire    Do you feel stress - tense, restless, nervous, or anxious, or unable to sleep at night because your mind is troubled all the time - these days?: Not at all  Social Connections: Moderately Integrated (08/15/2024)   Social Connection and Isolation Panel    Frequency of Communication with Friends and Family: More than three times a week    Frequency of Social Gatherings with Friends and Family: Three times a week    Attends Religious Services: More than 4 times per year    Active Member of Clubs or Organizations: Yes    Attends Banker Meetings: More than 4 times per year    Marital Status: Never married  Recent Concern: Social Connections - Socially Isolated (07/20/2024)   Received from [facility]   Social Connection and Isolation Panel    In a typical week, how many times do you talk on the phone with family, friends, or neighbors?: More than three times a week    How often do you get together with friends or relatives?: Once a week    How often do you attend church or religious services?: Never    Do you belong to any clubs or organizations such as church groups, unions, fraternal  or athletic groups, or school groups?: No    How often do you attend meetings of the clubs or organizations you belong to?: Never    Are you married, widowed, divorced, separated, never married, or living with a partner?: Never married  Intimate Partner Violence: Not At Risk (08/15/2024)   Humiliation, Afraid, Rape, and Kick questionnaire    Fear of Current or Ex-Partner: No    Emotionally Abused: No    Physically Abused: No    Sexually Abused: No    Review of Systems: Pertinent positive and negative review of systems were noted in the above HPI section.  All other review of systems was otherwise negative.   Physical Exam: Vital signs in last 24 hours: Temp:  [98.6 F (37 C)-99.7 F (37.6 C)] 99.7 F (37.6 C) (09/14 1417) Pulse Rate:  [80-88] 85 (09/14 1645) Resp:  [7-30] 17 (09/14 1645) BP: (  154-169)/(77-109) 154/109 (09/14 1645) SpO2:  [73 %-100 %] 100 % (09/14 1645) FiO2 (%):  [100 %] 100 % (09/14 1625)   General:   Alert,  Well-developed, obese elderly African-American female , acutely ill-appearing a bit lethargic but responds appropriately on questioning Head:  Normocephalic and atraumatic. Eyes:  Sclera clear, no icterus.   Conjunctiva pink. Ears:  Normal auditory acuity. Nose:  No deformity, discharge,  or lesions. Mouth:  No deformity or lesions.  Small amount of dried blood around mouth Neck:  Supple; no masses or thyromegaly. Lungs:  Clear throughout to auscultation.   No wheezes, crackles, or rhonchi.  Heart:  Regular rate and rhythm; no murmurs, clicks, rubs,  or gallops. Abdomen:  Soft,nontender, BS active,nonpalp mass or hsm.   Rectal:  Deferred  Msk:  Symmetrical without gross deformities. . Pulses:  Normal pulses noted. Extremities: Left upper extremity swollen, right lower extremity braced Neurologic:  Alert and  oriented x4;  grossly normal neurologically, a bit sleepy Skin:  Intact without significant lesions or rashes.. Psych:  Alert and cooperative.  Normal mood and affect.  Intake/Output from previous day: No intake/output data recorded. Intake/Output this shift: No intake/output data recorded.  Lab Results: Recent Labs    08/19/24 0320 08/21/24 1438  WBC 5.8 8.1  HGB 9.1* 10.2*  HCT 28.7* 32.6*  PLT 304 398   BMET Recent Labs    08/19/24 0320 08/21/24 1438  NA 136 137  K 3.8 3.8  CL 105 104  CO2 19* 20*  GLUCOSE 126* 132*  BUN 32* 29*  CREATININE 2.13* 2.13*  CALCIUM  7.8* 8.3*   LFT Recent Labs    08/21/24 1438  PROT 6.9  ALBUMIN  2.2*  AST 21  ALT 16  ALKPHOS 84  BILITOT 1.0   PT/INR Recent Labs    08/21/24 1438  LABPROT 22.3*  INR 1.9*   Hepatitis Panel No results for input(s): HEPBSAG, HCVAB, HEPAIGM, HEPBIGM in the last 72 hours.   IMPRESSION:  #108 78 year old African-American female brought to the ER from rehab facility after onset of hematemesis earlier this afternoon.  She reportedly had 4-5 episodes prior to arrival to the ER, she has not had any further vomiting here, reported to have had dark red blood.  On Eliquis  Hypertensive on arrival  Patient has been on antibiotics and antiviral, as well as Fosamax . Reports that she had been nauseated over the past week but had not vomited Has not noticed any melena or hematochezia  Hemoglobin here stable from very recent discharge 2 days ago  Etiology of her hematemesis not clear at this time, in setting of Eliquis . Rule out possible Mallory-Weiss tear, ulcerative esophagitis, peptic ulcer disease  #2 coagulopathy secondary to Eliquis  #3 anemia chronic currently stable  #4 patient just discharged from the hospital 08/21/2024 after admission with left facial droop and left ear pain.  Diagnosed with left otitis media, required myringotomy, felt to have nerve compression causing the left facial droop also had had an episode of very recent shingles involving the left ear.  #5 history of PE #6 heart failure with preserved EF #7 chronic  kidney disease stage IV #8 debility in part secondary to chronic right knee injury/pain-multiple prior right knee surgeries prior infection of the prosthetic knee  #9 diabetes mellitus  #10 history of prolonged QT #11 left bundle branch block with current runs in the ER of what appears to be V. tach but has been hemodynamically stable  Plan; n.p.o. for now except sips IV  PPI twice daily Repeat hemoglobin and then serial hemoglobins every 6 hours, transfuse as indicated for hemoglobin less than 7.5.  Discussed with patient, accepting of transfusions. Hold Eliquis  N.p.o. after midnight, patient has been scheduled for EGD with Dr. Stacia tomorrow morning 08/22/2024.  Discussed the procedure in detail with the patient, including indications risks and benefits, her son was also present at bedside and they are agreeable to proceed.  There has been discussion regarding orthopedics seeing the patient for abnormal knee imaging with displacement and possible transfer to [facility] for surgery. GI bleeding needs to be evaluated and sorted out prior to any knee surgery-she is also on Eliquis , and apparently has declined any further surgeries on her knee in the past.  GI will follow with you  Hold Fosamax       Amy Esterwood PA-C 08/21/2024, 5:09 PM   Attending physician's note  I personally saw the patient and performed a substantive portion of the medical decision making process for this encounter (including a complete performance of the key components : MDM, Hx and Exam), in conjunction with the APP.  I agree with the APP's note, impression, and  the management plan for the number and complexity of problems addressed at the encounter for the patient and take responsibility for that plan with its inherent risk of complications, morbidity, or mortality with additional input as follows.    78 year old female with CKD, history of PE, CVA on chronic Eliquis  recently discharged from hospital on  Augmentin  and valacyclovir  after treated for left ear pain, ?  Facial droop on the left concerning for Bell's palsy, she was discharged on Augmentin  and valacyclovir   She presented to ER with several episodes of dark blood emesis and hematemesis  Denies any abdominal pain  On exam abdomen soft no tenderness or distention  Hemoglobin stable, no hemodynamic instability Last dose of Eliquis  was earlier this morning.  Patient is also having pain in her right knee, she had multiple surgeries to the knee  Continue to monitor hemoglobin and transfuse if below 7 PPI IV twice daily Plan for urgent EGD tomorrow a.m. if has significant decline in hemoglobin if not we will wait for Eliquis  washout and tentatively plan EGD on Tuesday N.p.o. after midnight  Erosive esophagitis, erosive gastritis, gastroduodenal ulcer, Mallory-Weiss tear in the differential Avoid NSAIDs Hold Eliquis   GI will continue to follow along    High complex medical decision making (this includes chart review, review of results, face-to-face time used for counseling as well as treatment plan and follow-up. The patient was provided an opportunity to ask questions and all were answered. The patient agreed with the plan and demonstrated an understanding of the instructions.  LOIS Wilkie Mcgee , MD [phone]

## 2024-08-21 NOTE — ED Triage Notes (Signed)
 Patient BIB [facility] from [facility] for hematemesis that may have started around 1030. Patient has dried blood on her face and dried blood in an emesis basin. Patient is A&Ox4, VSS, on eliquis  and took her morning dose.  BP 182/110 HR 94 94% RA CBG 146 RR 18

## 2024-08-21 NOTE — Assessment & Plan Note (Signed)
 Transfuse as needed for hemoglobin below 7 or rapidly dropping

## 2024-08-21 NOTE — H&P (Signed)
 Beth [name] FMW:[id number] DOB: 1946/03/10 DOA: 08/21/2024     PCP: Zollie Lowers, MD     Patient arrived to ER on 08/21/24 at 1207 Referred by Attending Silvester Ales, MD   Patient coming from:     From facility   [name]    Chief Complaint:   Chief Complaint  Patient presents with   Hematemesis    HPI: Beth [name] is a 78 y.o. female with medical history significant of recent PE on Eliquis  CKD stage IV, DM2, HLD, HTN, history of stroke, diastolic CHF, obesity, anemia, recent admission, gout, history of infected total right knee replacement, otitis externa    Presented with  hematemesis that has started at 1030 AM  Pt came in from [facility] in rehab with hematemesis started around 10:30 PM Patient is on Eliquis  history of PE took her morning dose. Initial blood pressure 182/110 Noted epigastric abd pain  with n/v  Hg 10.2 INR 1.9 Pt has been recently admitted level hospitalized  There was some suspicion wants   of left arm cellulitis she was covered with rocephin   While in ER pt noted intermittent LBBB on telemetry Er discussed with cardiology who felt it was rate dependent block      Denies significant ETOH intake   Does not smoke      Regarding pertinent Chronic problems:    Hyperlipidemia -  on statins Lipitor  (atorvastatin )  Lipid Panel     Component Value Date/Time   CHOL 183 08/01/2024 1839   CHOL 152 03/03/2024 1539   CHOL 159 06/21/2013 1507   TRIG 77 08/01/2024 1839   TRIG 91 09/22/2013 1528   TRIG 83 06/21/2013 1507   HDL 47 08/01/2024 1839   HDL 56 03/03/2024 1539   HDL 56 09/22/2013 1528   HDL 51 06/21/2013 1507   CHOLHDL 3.9 08/01/2024 1839   VLDL 15 08/01/2024 1839   LDLCALC 121 (H) 08/01/2024 1839   LDLCALC 83 03/03/2024 1539   LDLCALC 103 (H) 09/22/2013 1528   LDLCALC 91 06/21/2013 1507   LABVLDL 13 03/03/2024 1539     HTN on Cardizem , hydralazine , Imdur    chronic CHF diastolic  - last echo  Recent Results (from the  past 56199 hours)  ECHOCARDIOGRAM COMPLETE   Collection Time: 08/01/24  5:07 PM  Result Value   BP 181/96   AR max vel 1.77   AV Peak grad 6.3   Ao pk vel 1.25   Area-P 1/2 2.62   Est EF 60 - 65%   Narrative      ECHOCARDIOGRAM REPORT      1. Left ventricular ejection fraction, by estimation, is 60 to 65%. The left ventricle has normal function. The left ventricle has no regional wall motion abnormalities. Left ventricular diastolic parameters are consistent with Grade I diastolic  dysfunction (impaired relaxation).  2. Right ventricular systolic function is normal. The right ventricular size is normal.  3. The mitral valve is normal in structure. No evidence of mitral valve regurgitation. No evidence of mitral stenosis.  4. The aortic valve is tricuspid. Aortic valve regurgitation is not visualized. Aortic valve sclerosis/calcification is present, without any evidence of aortic stenosis. Aortic valve Vmax measures 1.25 m/s.  5. The inferior vena cava is normal in size with greater than 50% respiratory variability, suggesting right atrial pressure of 3 mmHg.     *Note: Due to a large number of results and/or encounters for the requested time period, some results have not  been displayed. A complete set of results can be found in Results Review.       DM 2 -  Lab Results  Component Value Date   HGBA1C 6.5 (H) 08/01/2024  Jardiance      obesity-   BMI Readings from Last 1 Encounters:  08/15/24 36.44 kg/m       Hx of CVA -  with residual deficits on Aspirin  81 mg,       Hx of DVT/PE on - anticoagulation with   Eliquis ,      CKD stage IV baseline Cr 2.13 Estimated Creatinine Clearance: 24.6 mL/min (A) (by C-G formula based on SCr of 2.13 mg/dL (H)).  Lab Results  Component Value Date   CREATININE 2.13 (H) 08/21/2024   CREATININE 2.13 (H) 08/19/2024   CREATININE 2.01 (H) 08/18/2024   Lab Results  Component Value Date   NA 137 08/21/2024   CL 104 08/21/2024   K 3.8  08/21/2024   CO2 20 (L) 08/21/2024   BUN 29 (H) 08/21/2024   CREATININE 2.13 (H) 08/21/2024   GFRNONAA 23 (L) 08/21/2024   CALCIUM  8.3 (L) 08/21/2024   ALBUMIN  2.2 (L) 08/21/2024   GLUCOSE 132 (H) 08/21/2024      Chronic anemia - baseline hg Hemoglobin & Hematocrit  Recent Labs    08/18/24 0234 08/19/24 0320 08/21/24 1438  HGB 9.6* 9.1* 10.2*   Iron /TIBC/Ferritin/ %Sat    Component Value Date/Time   IRON  28 08/20/2023 1557   TIBC 232 (L) 08/20/2023 1557   FERRITIN 103 08/20/2023 1557   IRONPCTSAT 12 (L) 08/20/2023 1557   IRONPCTSAT 24 10/26/2012 1014    While in ER: Clinical Course as of 08/21/24 2054  Sun Aug 21, 2024  1225 ECHO 8/25 w/ LVEF 60-65%, G1DD  [SG]  1621 Spoke w/ Dr Burnetta, will d/w Dr Hiram, recommend med admit will see in consult  [SG]  1626 Spoke Dr Burnetta, spoke w/ Dr Hiram, recommend send to [facility] and he will see tomorrow. 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 Spoke w/ cardiology who confirms this > rate dependent block  [SG]    Clinical Course User Index [SG] Elnor Jayson LABOR, DO     Lab Orders         Resp panel by RT-PCR (RSV, Flu A&B, Covid) Anterior Nasal Swab         CBC with Differential         Comprehensive metabolic panel         Lipase, blood         Urinalysis, Routine w reflex microscopic -Urine, Clean Catch         Protime-INR         CBC         Hemoglobin A1c         Vitamin B12         Folate         Iron  and TIBC         Ferritin         Reticulocytes         Magnesium         CBG monitoring, ED      CXR - Retrocardiac opacity obscuring the left hemidiaphragm, possibly representing atelectasis or  airspace disease  Right knee - Fractured intramedullary fixation rod traversing the knee  joint with posterior displacement of the distal fracture component. 2. Lucency along the cement-bone interface, cannot exclude osteomyelitis. 3. Heterotopic calcification anterior to the distal femur. 4. Severe vascular calcifications.  CT right knee -  1. Antibiotic impregnated spacer in the distal femoral and proximal tibial cavities is partially fragmented, and the traversing threaded rod is fractured resulting in a pseudoarticulation. 2. The femoral component of the spacer is separated from the native bone by lucency of variable thickness, between 2 and 6 mm. 3. The tibial component is closely applied to the anterior tibia but has up to 6 mm of lucency around its posteromedial margin. 4. The patellar is flattened with associated volume loss. 5. Capsular calcifications anteriorly in the knee joint and scattered elsewhere in the knee joint. 6. Severe regional muscular atrophy. 7. Subcutaneous edema anterior to the patella and patellar tendon region.  CTabd/pelvis -  non-acute Tiny gallstones and/or gravel. No CT evidence of acute cholecystitis. Cardiomegaly and coronary artery disease.   CT temporal bones -  1. Opacification of the left external auditory canal and middle ear cavity with extension into the epitympanum. Effusion in the left mastoid air cells without coalescence. No osseous erosion. 2. Chronically opacified right maxillary sinus and mild mucosal thickening in the anterior right ethmoid air cells.  L UE doppler - Cystic structure noted lateral arm near [facility]  measuring  2.2 X 1.69 cm, etiology unknown. Heterogenous area noted lateral proximal  forearm, etiology unknown. Further imaging may be warranted if clinically  indicated.Limited visualization secondary to limitations stated above.    Following Medications were ordered in ER: Medications  octreotide  (SANDOSTATIN ) 2 mcg/mL load via infusion 50 mcg (50 mcg Intravenous Bolus from Bag 08/21/24 1617)    And   octreotide  (SANDOSTATIN ) 500 mcg in sodium chloride  0.9 % 250 mL (2 mcg/mL) infusion (50 mcg/hr Intravenous New Bag/Given 08/21/24 1620)  pantoprazole  (PROTONIX ) injection 40 mg (has no administration in time range)  insulin  aspart (novoLOG ) injection 0-9 Units ( Subcutaneous Not Given 08/21/24 2013)  pantoprazole  (PROTONIX ) injection 80 mg (80 mg Intravenous Given 08/21/24 1432)  ondansetron  (ZOFRAN ) injection 4 mg (4 mg Intravenous Given 08/21/24 1430)  cefTRIAXone  (ROCEPHIN ) 1 g in sodium chloride  0.9 % 100 mL IVPB (0 g Intravenous Stopped 08/21/24 1622)  sodium chloride  0.9 % bolus 1,000 mL (0 mLs Intravenous Stopped 08/21/24 1755)  famotidine  (PEPCID ) IVPB 20 mg premix (0 mg Intravenous Stopped 08/21/24 1618)  morphine  (PF) 4 MG/ML injection 4 mg (4 mg Intravenous Given 08/21/24 1442)  HYDROmorphone  (DILAUDID ) injection 0.5 mg (0.5 mg Intravenous Given 08/21/24 1616)    _______________________________________________________ ER Provider Called:   [facility]  They Recommend admit to medicine   Will see in AM   protonix  BID  Orthopedics:   Dr Burnetta will d/w Dr Hiram, recommend med admit will see in consult , recommendations noted in ED course. Pt now reports that she does not want any sort of surgical intervention on her knee.    Cardiology cardiology who confirms this > rate dependent block   IR -    ED Triage Vitals  Encounter Vitals Group     BP 08/21/24 1213 (!) 160/77     Girls Systolic BP Percentile --      Girls Diastolic BP Percentile --      Boys Systolic BP Percentile --      Boys Diastolic BP Percentile --  Pulse Rate 08/21/24 1213 83     Resp 08/21/24 1213 18     Temp 08/21/24 1213 98.6 F (37 C)     Temp Source 08/21/24 1417 Axillary     SpO2 08/21/24 1213 97 %     Weight --      Height --      Head Circumference --      Peak Flow --      Pain Score 08/21/24 1429 8     Pain Loc --      Pain Education --      Exclude from Growth Chart --   UFJK(75)@      _________________________________________ Significant initial  Findings: Abnormal Labs Reviewed  CBC WITH DIFFERENTIAL/PLATELET - Abnormal; Notable for the following components:      Result Value   RBC 3.80 (*)    Hemoglobin 10.2 (*)    HCT 32.6 (*)    RDW 16.0 (*)    All other components within normal limits  COMPREHENSIVE METABOLIC PANEL WITH GFR - Abnormal; Notable for the following components:   CO2 20 (*)    Glucose, Bld 132 (*)    BUN 29 (*)    Creatinine, Ser 2.13 (*)    Calcium  8.3 (*)    Albumin  2.2 (*)    GFR, Estimated 23 (*)    All other components within normal limits  PROTIME-INR - Abnormal; Notable for the following components:   Prothrombin Time 22.3 (*)    INR 1.9 (*)    All other components within normal limits  CBG MONITORING, ED - Abnormal; Notable for the following components:   Glucose-Capillary 119 (*)    All other components within normal limits   ECG: Ordered Personally reviewed and interpreted by me showing: HR : 86 Rhythm: Sinus rhythm Probable left atrial enlargement Nonspecific IVCD with LAD Left ventricular hypertrophy Inferolateral infarct, acute QTC 523  BNP (last 3 results) Recent Labs    08/01/24 0918  BNP 162.7*     Recent Labs    08/19/24 0320  CRP 13.5*        The recent clinical data is shown below. Vitals:   08/21/24 1645 08/21/24 1900 08/21/24 2000 08/21/24 2018  BP: (!) 154/109 (!) 160/82 (!) 164/99   Pulse: 85 81 85   Resp: 17 (!) 21 14   Temp:    98.1 F (36.7 C)  TempSrc:    Temporal  SpO2: 100% 100% 94%     WBC     Component Value Date/Time   WBC 8.1 08/21/2024 1438   LYMPHSABS 1.5 08/21/2024 1438   LYMPHSABS 1.3 03/03/2024 1539   LYMPHSABS 1.6 10/26/2012 1014   MONOABS 1.0 08/21/2024 1438   MONOABS 0.5 10/26/2012 1014   EOSABS 0.1 08/21/2024 1438   EOSABS 0.1 03/03/2024 1539   BASOSABS 0.0 08/21/2024 1438   BASOSABS 0.0 03/03/2024 1539   BASOSABS 0.0 10/26/2012 1014        UA  ordered      Results for orders placed or performed during the hospital encounter of 08/21/24  Resp panel by RT-PCR (RSV, Flu A&B, Covid) Anterior Nasal Swab     Status: None   Collection Time: 08/21/24  2:38 PM   Specimen: Anterior Nasal Swab  Result Value Ref Range Status   SARS Coronavirus 2 by RT PCR NEGATIVE NEGATIVE Final   Influenza A by PCR NEGATIVE NEGATIVE Final   Influenza B by PCR NEGATIVE NEGATIVE Final  Resp Syncytial Virus by PCR NEGATIVE NEGATIVE Final          ABX started Antibiotics Given (last 72 hours)     Date/Time Action Medication Dose Rate   08/21/24 1558 New Bag/Given   cefTRIAXone  (ROCEPHIN ) 1 g in sodium chloride  0.9 % 100 mL IVPB 1 g 200 mL/hr         __________________________________________________________ Recent Labs  Lab 08/16/24 0539 08/17/24 0403 08/18/24 0234 08/19/24 0320 08/21/24 1438  NA 140 140 139 136 137  K 4.1 4.0 4.1 3.8 3.8  CO2 18* 20* 18* 19* 20*  GLUCOSE 120* 128* 114* 126* 132*  BUN 28* 26* 26* 32* 29*  CREATININE 1.95* 1.93* 2.01* 2.13* 2.13*  CALCIUM  8.7* 8.3* 8.0* 7.8* 8.3*  MG 1.9 1.9 1.9 1.9  --     Cr   stable,    Lab Results  Component Value Date   CREATININE 2.13 (H) 08/21/2024   CREATININE 2.13 (H) 08/19/2024   CREATININE 2.01 (H) 08/18/2024    Recent Labs  Lab 08/14/24 2100 08/21/24 1438  AST 16 21  ALT 14 16  ALKPHOS 87 84  BILITOT 0.9 1.0  PROT 6.7 6.9  ALBUMIN  2.7* 2.2*   Lab Results  Component Value Date   CALCIUM  8.3 (L) 08/21/2024    Plt: Lab Results  Component Value Date   PLT 398 08/21/2024       Recent Labs  Lab 08/16/24 0539 08/17/24 0403 08/18/24 0234 08/19/24 0320 08/21/24 1438  WBC 5.6 5.9 6.1 5.8 8.1  NEUTROABS 4.2 4.0 3.8 3.5 5.4  HGB 10.5* 10.2* 9.6* 9.1* 10.2*  HCT 34.1* 32.8* 30.6* 28.7* 32.6*  MCV 85.5 84.8 86.0 84.2 85.8  PLT 339 313 297 304 398    HG/HCT  Down   from baseline see below    Component Value Date/Time   HGB 10.2 (L) 08/21/2024 1438   HGB  12.1 03/03/2024 1539   HGB 13.3 10/26/2012 1014   HCT 32.6 (L) 08/21/2024 1438   HCT 38.4 03/03/2024 1539   HCT 41.0 10/26/2012 1014   MCV 85.8 08/21/2024 1438   MCV 82 03/03/2024 1539   MCV 82.1 10/26/2012 1014    Recent Labs  Lab 08/21/24 1438  LIPASE 42    _______________________________________________ Hospitalist was called for admission for upper GI bleed, 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 acute septic joint versus cellulitis of the left elbow   The following Work up has been ordered so far:  Orders Placed This Encounter  Procedures   Procedural/ Surgical Case Request: EGD (ESOPHAGOGASTRODUODENOSCOPY)   Resp panel by RT-PCR (RSV, Flu A&B, Covid) Anterior Nasal Swab   DG Chest Portable 1 View   DG Knee 2 Views Right   CT CHEST ABDOMEN PELVIS WO CONTRAST   CT Temporal Bones Wo Contrast   CT Knee Right Wo Contrast   CBC with Differential   Comprehensive metabolic panel   Lipase, blood   Urinalysis, Routine w reflex microscopic -Urine, Clean Catch   Protime-INR   CBC   Hemoglobin A1c   Vitamin B12   Folate   Iron  and TIBC   Ferritin   Reticulocytes   Magnesium   Diet NPO time specified Except for: Sips with Meds   Cardiac Monitoring - Continuous Indefinite   Apply Diabetes Mellitus Care Plan   STAT CBG when hypoglycemia is suspected. If treated, recheck every 15 minutes after each treatment until CBG >/= 70 mg/dl   Refer to Hypoglycemia Protocol Sidebar Report for  treatment of CBG < 70 mg/dl   Consult to gastroenterology   Consult to orthopedic surgery   Inpatient consult to Cardiology   Consult to hospitalist   Inpatient consult to Cardiology   Consult to interventional radiologist (physician to physician consult) [phone]   ED Pulse oximetry, continuous   CBG monitoring, ED   EKG 12-Lead   EKG 12-Lead   EKG 12-Lead   Type and screen [facility]   Insert peripheral IV   Admit to Inpatient (patient's expected length of stay will  be greater than 2 midnights or inpatient only procedure)   UE VENOUS DUPLEX (7am - 7pm)     OTHER Significant initial  Findings:  labs showing:     DM  labs:  HbA1C: Recent Labs    10/21/23 1512 03/03/24 1537 08/01/24 0924  HGBA1C 5.7* 5.8* 6.5*       CBG (last 3)  Recent Labs    08/21/24 2012  GLUCAP 119*          Cultures:    Component Value Date/Time   SDES ABSCESS 08/15/2024 1201   SPECREQUEST EAR,LEFT 08/15/2024 1201   CULT  08/15/2024 1201    No growth aerobically or anaerobically. Performed at [facility], [address] [place], [place] [address]    REPTSTATUS 08/20/2024 FINAL 08/15/2024 1201     Radiological Exams on Admission: CT Knee Right Wo Contrast Result Date: 08/21/2024 CLINICAL DATA:  Hardware failure, right knee EXAM: CT OF THE RIGHT KNEE WITHOUT CONTRAST TECHNIQUE: Multidetector CT imaging of the right knee was performed according to the standard protocol. Multiplanar CT image reconstructions were also generated. RADIATION DOSE REDUCTION: This exam was performed according to the departmental dose-optimization program which includes automated exposure control, adjustment of the mA and/or kV according to patient size and/or use of iterative reconstruction technique. 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 Two threaded rods are present, 1 of which extends from the pre tibial subcutaneous tissues below the tibial tubercle and into the tibial component of the methacrylate proximally to the level of a transverse lucent plane through the methacrylate as shown on image 71 series 7. Second rod extends from the native bone proximal tibia through the tibial methacrylate but the rod is fractured at the level of a  lucent plane between the tibial and the femoral component of the methacrylate with 8 mm of displacement as shown on image 67 series 7. The distal fragment continues through the femoral cavity component of the spacer in which it terminates near the proximal tip of the spacer. The femoral component of the spacer is separated from the native bone by lucency of variable thickness, between 2 and 6 mm. The tibial component is closely applied to the anterior tibia but has up to 6 mm of lucency around its posteromedial margin. With the fracture in the rod along the plane between the femoral and tibial component of the spacer, there is resulting pseudoarticulation with a non rigid attachment as shown on image 67 series 7. The patellar is flattened with associated volume loss. Capsular calcifications anteriorly in the knee joint and scattered elsewhere in the knee joint. Ligaments Suboptimally assessed by CT. Muscles and Tendons Severe regional muscular atrophy. Soft tissues Subcutaneous edema anterior to the patella and patellar tendon region.  Atheromatous vascular calcifications. IMPRESSION: 1. Antibiotic impregnated spacer in the distal femoral and proximal tibial cavities is partially fragmented, and the traversing threaded rod is fractured resulting in a pseudoarticulation. 2. The femoral component of the spacer is separated from the native bone by lucency of variable thickness, between 2 and 6 mm. 3. The tibial component is closely applied to the anterior tibia but has up to 6 mm of lucency around its posteromedial margin. 4. The patellar is flattened with associated volume loss. 5. Capsular calcifications anteriorly in the knee joint and scattered elsewhere in the knee joint. 6. Severe regional muscular atrophy. 7. Subcutaneous edema anterior to the patella and patellar tendon region. Electronically Signed   By: Ryan Salvage M.D.   On: 08/21/2024 18:38   UE VENOUS DUPLEX (7am - 7pm) Result Date:  08/21/2024 UPPER VENOUS STUDY  Patient Name:  PORSCHE NOGUCHI  Date of Exam:   08/21/2024 Medical Rec #: [id number]     Accession #:    [id number] Date of Birth: 08-Jan-1946     Patient Gender: F Patient Age:   84 years Exam Location:  [facility] Procedure:      VAS US  UPPER EXTREMITY VENOUS DUPLEX Referring Phys: JAYSON PEREYRA --------------------------------------------------------------------------------  Indications: Pain, and Swelling of left upper extremity Risk Factors: 08/06/2024 PE History of gout. Anticoagulation: Eliquis . Limitations: Poor ultrasound/tissue interface, musculoskeletal features and Edema. Comparison Study: No prior upper extremity venous duplex on file Performing Technologist: Alberta Lis RVS  Examination Guidelines: A complete evaluation includes B-mode imaging, spectral Doppler, color Doppler, and power Doppler as needed of all accessible portions of each vessel. Bilateral testing is considered an integral part of a complete examination. Limited examinations for reoccurring indications may be performed as noted.  Right Findings: +----------+------------+---------+-----------+----------+-------+ RIGHT     CompressiblePhasicitySpontaneousPropertiesSummary +----------+------------+---------+-----------+----------+-------+ Subclavian               Yes       No                       +----------+------------+---------+-----------+----------+-------+  Left Findings: +----------+------------+---------+-----------+----------+-------+ LEFT      CompressiblePhasicitySpontaneousPropertiesSummary +----------+------------+---------+-----------+----------+-------+ IJV           Full       Yes       No                       +----------+------------+---------+-----------+----------+-------+ Subclavian               Yes       No                       +----------+------------+---------+-----------+----------+-------+ Axillary                 Yes       No                        +----------+------------+---------+-----------+----------+-------+ Brachial                 Yes       No                       +----------+------------+---------+-----------+----------+-------+ Radial        Full                                          +----------+------------+---------+-----------+----------+-------+  Ulnar         Full                                          +----------+------------+---------+-----------+----------+-------+ Cephalic      Full                                          +----------+------------+---------+-----------+----------+-------+ Basilic                  Yes       No                       +----------+------------+---------+-----------+----------+-------+  Summary:  Right: No evidence of thrombosis in the subclavian.  Left: No obvious evidence of deep vein thrombosis in the visualized veins of the upper extremity. No evidence of superficial vein thrombosis in the visualized veins of the upper extremity. Vessels in the upper arm are tortuous. Doppler waveforms are pulsatile throughout. Cystic structure noted lateral arm near [facility] measuring 2.2 X 1.69 cm, etiology unknown. Heterogenous area noted lateral proximal forearm, etiology unknown. Further imaging may be warranted if clinically indicated.Limited visualization secondary to limitations stated above.  *See table(s) above for measurements and observations.     Preliminary    CT Temporal Bones Wo Contrast Result Date: 08/21/2024 EXAM: CT TEMPORAL BONES WITHOUT CONTRAST 08/21/2024 03:26:31 PM TECHNIQUE: CT of the temporal bones was performed without the administration of intravenous contrast. Multiplanar reformatted images are provided for review. Automated exposure control, iterative reconstruction, and/or weight based adjustment of the mA/kV was utilized to reduce the radiation dose to as low as reasonably achievable. COMPARISON: None available. CLINICAL HISTORY: Left ear pain  s/p myringotomy 5 days ago. FINDINGS: RIGHT TEMPORAL BONE: EXTERNAL AUDITORY CANAL: Clear. No bony erosion. Scutum is intact. MIDDLE EAR CAVITY: Clear. Ossicular chain is intact. MASTOID AIR CELLS: Clear. INNER EAR: The cochlea and vestibule are unremarkable. Normal mineralization of the otic capsule. Normal semicircular canals. The vestibular aqueduct is not dilated. INTERNAL AUDITORY CANAL: Unremarkable. Normal bony canal of the facial nerve. LEFT TEMPORAL BONE: EXTERNAL AUDITORY CANAL: Opacified. The tympanic membrane is not excludedly visualized. MIDDLE EAR CAVITY: Opacified. Ossicles are intact. No focal osseous erosions are present. Fluid or soft tissue extend into the epitympanum. MASTOID AIR CELLS: Effusion is present without coalescence. INNER EAR: The cochlea and vestibule are unremarkable. Normal mineralization of the otic capsule. Normal semicircular canals. The vestibular aqueduct is not dilated. INTERNAL AUDITORY CANAL: Unremarkable. Normal bony canal of the facial nerve. VASCULAR: Atherosclerotic calcifications are present in the cavernous carotid arteries bilaterally. No hyperdense vessel is present. BRAIN: Unremarkable. ORBITS: No acute abnormality. SINUSES: Right maxillary sinus is chronically opacified. Mild mucosal thickening is present within the anterior right ethmoid air cells. IMPRESSION: 1. Opacification of the left external auditory canal and middle ear cavity with extension into the epitympanum. Effusion in the left mastoid air cells without coalescence. No osseous erosion. 2. Chronically opacified right maxillary sinus and mild mucosal thickening in the anterior right ethmoid air cells. Electronically signed by: Lonni Necessary MD 08/21/2024 04:19 PM EDT RP Workstation: [id number]   CT CHEST ABDOMEN PELVIS WO CONTRAST Result Date: 08/21/2024 CLINICAL DATA:  Epigastric pain EXAM: CT CHEST, ABDOMEN AND PELVIS WITHOUT CONTRAST TECHNIQUE: Multidetector CT imaging of  the chest, abdomen  and pelvis was performed following the standard protocol without IV contrast. RADIATION DOSE REDUCTION: This exam was performed according to the departmental dose-optimization program which includes automated exposure control, adjustment of the mA and/or kV according to patient size and/or use of iterative reconstruction technique. COMPARISON:  None Available. FINDINGS: CT CHEST FINDINGS Cardiovascular: Aortic atherosclerosis. Cardiomegaly. Extensive three-vessel coronary artery calcifications and or stents. No pericardial effusion. Mediastinum/Nodes: No enlarged mediastinal, hilar, or axillary lymph nodes. Thyroid  gland, trachea, and esophagus demonstrate no significant findings. Lungs/Pleura: Dependent bibasilar scarring or atelectasis. No pleural effusion or pneumothorax. Musculoskeletal: No chest wall abnormality. No acute osseous findings. CT ABDOMEN PELVIS FINDINGS Hepatobiliary: No solid liver abnormality is seen. Tiny gallstones and/or gravel (series 3, image 66). No gallbladder wall thickening, or biliary dilatation. Pancreas: Unremarkable. No pancreatic ductal dilatation or surrounding inflammatory changes. Spleen: Normal in size without significant abnormality. Adrenals/Urinary Tract: Adrenal glands are unremarkable. Small nonobstructive bilateral renal calculi and or renal vascular calcifications. No ureteral calculi or hydronephrosis. Numerous phleboliths throughout the low pelvis, which are not favored to reflect urinary tract calculi. Bladder is unremarkable. Stomach/Bowel: Stomach is within normal limits. Appendix not clearly visualized. No evidence of bowel wall thickening, distention, or inflammatory changes. Sigmoid diverticulosis. Vascular/Lymphatic: No significant vascular findings are present. No enlarged abdominal or pelvic lymph nodes. Reproductive: No mass or other abnormality. Other: No abdominal wall hernia or abnormality. No ascites. Musculoskeletal: No acute osseous findings. IMPRESSION:  1. No acute noncontrast CT findings of the chest, abdomen, or pelvis to explain epigastric pain. 2. Tiny gallstones and/or gravel. No CT evidence of acute cholecystitis. 3. Small nonobstructive bilateral renal calculi and or renal vascular calcifications. No ureteral calculi or hydronephrosis. Numerous phleboliths throughout the pelvis which are not favored to reflect urinary tract calculi. 4. Sigmoid diverticulosis without evidence of acute diverticulitis. 5. Cardiomegaly and coronary artery disease. Aortic Atherosclerosis (ICD10-I70.0). Electronically Signed   By: Marolyn JONETTA Jaksch M.D.   On: 08/21/2024 16:03   DG Knee 2 Views Right Result Date: 08/21/2024 EXAM: 1 or 2 VIEW(S) XRAY OF THE RIGHT KNEE 08/21/2024 12:57:00 PM COMPARISON: MRI from 06/29/2020. CLINICAL HISTORY: Knee pain. Reason for exam: hematemesis, knee pain; Per triage notes: Patient BIB [facility] from [facility] for hematemesis that may have started around 1030. Patient has dried blood on her face and dried blood in an emesis basin. FINDINGS: BONES AND JOINTS: Postsurgical changes of distal femur and proximal tibia status post removal of right knee arthroplasty device and subsequent placement of cement and intramedullary fixation rods. The fixation rod traversing the knee joint is fractured with posterior displacement of the distal fracture component. Lucency along the cement bone interface is noted. Heterotopic calcification anterior to the distal femur. SOFT TISSUES: Severe vascular calcifications. IMPRESSION: 1. Fractured intramedullary fixation rod traversing the knee joint with posterior displacement of the distal fracture component. 2. Lucency along the cement-bone interface, cannot exclude osteomyelitis. 3. Heterotopic calcification anterior to the distal femur. 4. Severe vascular calcifications. Electronically signed by: Waddell Calk MD 08/21/2024 01:22 PM EDT RP Workstation: [id number]   DG Chest Portable 1 View Result Date:  08/21/2024 EXAM: 1 VIEW XRAY OF THE CHEST 08/21/2024 12:57:00 PM COMPARISON: 08/14/2024 CLINICAL HISTORY: Hematemsis. Reason for exam: hematemesis, knee pain; Per triage notes: Patient BIB [facility] from [facility] re[facility] for hematemesis that may have started around 1030. Patient has dried blood on her face and dried blood in an emesis basin. FINDINGS: LUNGS AND PLEURA: Retrocardiac opacity is identified obscuring  the left hemidiaphragm this may represent atelectasis or airspace disease. No pleural effusion. No pneumothorax. HEART AND MEDIASTINUM: Cardiomegaly. Atherosclerotic calcifications. BONES AND SOFT TISSUES: Multilevel degenerative changes of thoracic spine. No acute osseous abnormality. IMPRESSION: 1. Retrocardiac opacity obscuring the left hemidiaphragm, possibly representing atelectasis or airspace disease. 2. Cardiomegaly and atherosclerotic calcifications. Electronically signed by: Waddell Calk MD 08/21/2024 01:11 PM EDT RP Workstation: [id number]   _______________________________________________________________________________________________________ Latest  Blood pressure (!) 164/99, pulse 85, temperature 98.1 F (36.7 C), temperature source Temporal, resp. rate 14, SpO2 94%.   Vitals  labs and radiology finding personally reviewed  Review of Systems:    Pertinent positives include:  fatigue, Left arm pain and swelling  Hematemesis abdominal pain, nausea, vomiting Constitutional:  No weight loss, night sweats, Fevers, chills,  weight loss  HEENT:  No headaches, Difficulty swallowing,Tooth/dental problems,Sore throat,  No sneezing, itching, ear ache, nasal congestion, post nasal drip,  Cardio-vascular:  No chest pain, Orthopnea, PND, anasarca, dizziness, palpitations.no Bilateral lower extremity swelling  GI:  No heartburn, indigestion, , diarrhea, change in bowel habits, loss of appetite, melena, blood in stool,  Resp:  no shortness of breath at rest. No dyspnea on exertion, No  excess mucus, no productive cough, No non-productive cough, No coughing up of blood.No change in color of mucus.No wheezing. Skin:  no rash or lesions. No jaundice GU:  no dysuria, change in color of urine, no urgency or frequency. No straining to urinate.  No flank pain.  Musculoskeletal:  No joint pain or no joint swelling. No decreased range of motion. No back pain.  Psych:  No change in mood or affect. No depression or anxiety. No memory loss.  Neuro: no localizing neurological complaints, no tingling, no weakness, no double vision, no gait abnormality, no slurred speech, no confusion  All systems reviewed and apart from HOPI all are negative _______________________________________________________________________________________________ Past Medical History:   Past Medical History:  Diagnosis Date   Anemia    Arthritis    Knee both knees   Blood transfusion without reported diagnosis 2012   anemia;pt denies transfusion stated was only on iron  tablet   Cataract    left   CKD (chronic kidney disease), stage III (HCC)    Diabetes mellitus without complication (HCC)    Family history of anesthesia complication    sister very slow to awaken after anesthesia;severe vomiting    Gout    left elbow   Herpes infection 08/09/2014   Saw doctor Wed. 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   Other acute osteomyelitis, right femur (HCC) 07/23/2020   Pseudomonas aeruginosa infection 12/15/2018   Pseudomonas aeruginosa infection 12/15/2018   Septic arthritis of knee, right (HCC) 08/27/2020   Stroke (HCC) 2006   x 1 no deficits noted       Past Surgical History:  Procedure Laterality Date   ABDOMINAL HYSTERECTOMY  1983   CARPAL TUNNEL RELEASE Right 1983   colonscopy   June 21, 2014   EXCISIONAL TOTAL KNEE ARTHROPLASTY WITH ANTIBIOTIC SPACERS Right 02/16/2015   Procedure: RIGHT KNEE RESECTION ARTHROPLASTY WITH ANTIBIOTIC SPACERS;  Surgeon: Dempsey Moan, MD;  Location: [facility];  Service: Orthopedics;  Laterality: Right;   EXCISIONAL TOTAL KNEE ARTHROPLASTY WITH ANTIBIOTIC SPACERS Right 11/03/2018   Procedure: Right  knee resection arthroplasty; antibiotic spacer;  Surgeon: Melodi Lerner, MD;  Location: [facility];  Service: Orthopedics;  Laterality: Right;  Adductor Block   I & D KNEE WITH POLY EXCHANGE Right 10/02/2014   Procedure: IRRIGATION AND DEBRIDEMENT RIGHT KNEE WITH POLY EXCHANGE;  Surgeon: Lerner Melodi GAILS, MD;  Location: [facility];  Service: Orthopedics;  Laterality: Right;   I & D KNEE WITH POLY EXCHANGE Right 08/27/2020   Procedure: IRRIGATION AND DEBRIDEMENT; SPACER EXCHANGE RIGHT KNEE with multiple specimens;  Surgeon: Melodi Lerner, MD;  Location: [facility];  Service: Orthopedics;  Laterality: Right;    INCISION AND DRAINAGE OF WOUND Right 01/14/2017   Procedure: IRRIGATION AND DEBRIDEMENT WOUND;  Surgeon: Lerner Melodi, MD;  Location: [facility];  Service: Orthopedics;  Laterality: Right;  requests   JOINT REPLACEMENT  06/2014   right knee   MYRINGOTOMY WITH TUBE PLACEMENT Left 08/15/2024   Procedure: MYRINGOTOMY WITH LEFT EAR TUBE PLACEMENT;  Surgeon: Roark Rush, MD;  Location: [facility];  Service: ENT;  Laterality: Left;   nasal cauterization  2012   PATELLAR TENDON REPAIR Right 08/11/2014   Procedure: RIGHT PATELLA TENDON REPAIR;  Surgeon: Lerner Melodi GAILS, MD;  Location: [facility];  Service: Orthopedics;  Laterality: Right;   REIMPLANTATION OF TOTAL KNEE Right 05/23/2015   Procedure: RIGHT KNEE ARTHROPLASTY REIMPLANTATION;  Surgeon: Lerner Melodi, MD;  Location: [facility];  Service: Orthopedics;  Laterality: Right;   TOTAL KNEE ARTHROPLASTY Right 07/03/2014   Procedure: RIGHT TOTAL KNEE ARTHROPLASTY;  Surgeon: Lerner Melodi GAILS, MD;  Location: [facility];  Service:  Orthopedics;  Laterality: Right;   TUBAL LIGATION      Social History:  Ambulatory  walker      reports that she has never smoked. She has never used smokeless tobacco. She reports that she does not drink alcohol and does not use drugs.    Family History:  Family History  Problem Relation Age of Onset   Ovarian cancer Mother    Cancer Mother    Peripheral vascular disease Father        with amputation of both legs   Hypertension Father    Heart disease Brother 51   Kidney disease Daughter    Heart disease Daughter 5   Diabetes Son    Diabetes Son    Colon cancer Neg Hx    Esophageal cancer Neg Hx    Stomach cancer Neg Hx    Rectal cancer Neg Hx    ______________________________________________________________________________________________ Allergies: Allergies  Allergen Reactions   Cefdinir Swelling and Rash    Tolerated cephalosporins many times in the past   Aleve [Naproxen Sodium] Other (See Comments)    Heart races   Dorethia Bunk ] Other (See Comments)    Nose bleeding   Ciprofloxacin  Other (See Comments)    Possible hamstring tendinopathy   Clonidine  Derivatives Other (See Comments)    Dizziness and weakness   Shellfish Allergy Nausea And Vomiting     Prior to Admission medications   Medication Sig Start Date End Date Taking? Authorizing Provider  alendronate  (FOSAMAX ) 70 MG tablet TAKE 1 TABLET WEEKLY (TAKE WITH 8OZ OF WATER  30 MINUTES BEFORE BREAKFAST) Patient taking differently: Take 70 mg by mouth every Tuesday. 05/16/24  Yes Zollie Lowers, MD  amoxicillin -clavulanate (AUGMENTIN ) 875-125 MG tablet Take 1 tablet by mouth 2 (two) times daily. 08/19/24  Yes Dennise Lavada POUR, MD  apixaban  (ELIQUIS ) 5 MG TABS tablet Take 2 tablets (10 mg total) by mouth 2 (two) times daily  for 7 days, THEN 1 tablet (5 mg total) 2 (two) times daily. Patient taking differently: Take 1 tablet (5mg ) by mouth twice daily. 08/07/24 09/13/24 Yes Darci Pore, MD  aspirin  EC 81  MG tablet Take 1 tablet (81 mg total) by mouth daily. Swallow whole. 08/08/24  Yes Darci Pore, MD  atorvastatin  (LIPITOR ) 40 MG tablet Take 40 mg by mouth at bedtime. 08/08/24  Yes [provider]  azelastine  (ASTELIN ) 0.1 % nasal spray Place 1 spray into both nostrils 2 (two) times daily. Use in each nostril as directed 07/26/24  Yes [facility] Morton Hummer, Nena, NP  ciprofloxacin -dexamethasone  (CIPRODEX ) OTIC suspension Place 4 drops into the left ear 2 (two) times daily. 08/07/24  Yes Darci Pore, MD  colchicine  0.6 MG tablet Take twice daily for gout attack. (may take every two hours up to 6 doses at acute onset) Patient taking differently: Take 0.6 mg by mouth 2 (two) times daily. 03/03/24  Yes Stacks, Butler, MD  diltiazem  (CARDIZEM  CD) 240 MG 24 hr capsule TAKE ONE CAPSULE BY MOUTH DAILY 05/16/24  Yes Zollie Butler, MD  empagliflozin  (JARDIANCE ) 10 MG TABS tablet Take 1 tablet (10 mg total) by mouth daily. 09/29/23  Yes Zollie Butler, MD  ferrous sulfate  325 (65 FE) MG tablet Take 325 mg by mouth 2 (two) times a week. Take one tablet by mouth on Tue & Thurs.   Yes [provider]  hydrALAZINE  (APRESOLINE ) 25 MG tablet TAKE ONE (1) TABLET BY MOUTH 3 TIMES DAILY 07/18/24  Yes Zollie Butler, MD  HYDROcodone -acetaminophen  (NORCO/VICODIN) 5-325 MG tablet Take 1 tablet by mouth every 6 (six) hours as needed for moderate pain (pain score 4-6). 08/19/24  Yes Singh, Prashant K, MD  hydrocortisone  cream 1 % Apply topically 2 (two) times daily. To left ear 08/19/24  Yes Dennise Lavada POUR, MD  isosorbide  mononitrate (IMDUR ) 60 MG 24 hr tablet Take 1 tablet (60 mg total) by mouth daily. 08/08/24  Yes Darci Pore, MD  ondansetron  (ZOFRAN -ODT) 4 MG disintegrating tablet Take 4 mg by mouth every 8 (eight) hours as needed for vomiting or nausea. 08/08/24  Yes [provider]  valACYclovir  (VALTREX ) 500 MG tablet Take 1 tablet (500 mg total) by mouth 2 (two) times  daily. Patient not taking: Reported on 08/21/2024 08/19/24   Dennise Lavada POUR, MD    ___________________________________________________________________________________________________ Physical Exam:    08/21/2024    8:00 PM 08/21/2024    7:00 PM 08/21/2024    4:45 PM  Vitals with BMI  Systolic 164 160 845  Diastolic 99 82 109  Pulse 85 81 85     1. General:  in No  Acute distress   Chronically ill   -appearing 2. Psychological: Alert and   Oriented 3. Head/ENT:   Dry Mucous Membranes                          Head Non traumatic, neck supple                         Poor Dentition 4. SKIN: normal  Skin turgor,  Skin clean Dry and intact no rash Rash on the left face healing 5. Heart: Regular rate and rhythm no  Murmur, no Rub or gallop 6. Lungs:  no wheezes or crackles   7. Abdomen: Soft, epigastric tender, Non distended   obese  bowel sounds present 8. Lower extremities: no clubbing, cyanosis, trace edema 9. Neurologically  left facial droop 10. MSK: Normal range of motion    Chart has been reviewed  ______________________________________________________________________________________________  Assessment/Plan 78 y.o. 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 acute septic joint versus cellulitis of the left elbow    Present on Admission:  Upper GI bleed  Prolonged QT interval  CKD (chronic kidney disease), stage IV (HCC)  Otitis externa  History of pulmonary embolism  Heart failure with preserved ejection fraction (HCC)  Acute kidney injury superimposed on chronic kidney disease (HCC)  Acute  suppurative otitis media of left ear without spontaneous rupture of tympanic membrane  Anemia, iron  deficiency  Essential hypertension  HLD (hyperlipidemia)  Septic joint of left elbow (HCC)     Prolonged QT interval - will monitor on tele avoid QT prolonging medications, rehydrate correct electrolytes   CKD (chronic kidney disease), stage IV (HCC)  -chronic avoid nephrotoxic medications such as NSAIDs, Vanco Zosyn  combo,  avoid hypotension, continue to follow renal function   Diabetes type 2, controlled (HCC)  - Order Sensitive   SSI    -  check TSH and HgA1C  - Hold by mouth medications     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 Johann  with IR patient may need iV filter in AM will keep NPO for now    Heart failure with preserved ejection fraction (HCC) Chronic stable diastolic CHF Avoid fluid overload  Acute kidney injury superimposed on chronic kidney disease (HCC) Monitor renal function  -chronic avoid nephrotoxic medications such as NSAIDs, Vanco Zosyn  combo,  avoid hypotension, continue to follow renal function    Acute suppurative otitis media of left ear without spontaneous rupture of tympanic membrane Continue eardrops  Anemia, iron  deficiency Transfuse as needed for hemoglobin below 7 or rapidly dropping  Essential hypertension Allow permissive hypertension  HLD (hyperlipidemia) Continue Lipitor  40 mg a day  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 appreciate their consult   - serial CBC.    - Monitor for any recurrence,  evidence of hemodynamic instability or significant blood loss  - Transfuse as needed for hemoglobin below 7 or evidence of life-threatening bleeding  - Establish at least 2 PIV and fluid resuscitate patient is a difficult stick discussed case with IR will attempt to leave triple-lumen tomorrow after procedure  - clear liquids for tonight keep nothing by mouth post midnight,   -  administer Protonix    twice a day     - ER provider Administered octreotide        Septic joint of left elbow (HCC) Ultrasound showed no evidence of DVT but suspicious for cystic lesion CT scan showed Multiloculated complex high density elbow effusion that may represent hemarthrosis versus septic joint.   For tonight cover with cefepime  and vancomycin  Orthopedics already been called for right knee infection which is actually chronic patient stated that she did not want anybody to operate on her right knee anymore Now the left elbow issue is new patient does have history of gout but this is unclear if this is related or not. Obtain uric acid Orthopedics consult   Other plan as per orders.  DVT prophylaxis:  SCD         Code Status:    Code Status: Prior FULL CODE  as per patient   I had personally discussed CODE STATUS with patient and family   ACP   none   Family Communication:   Family not at  Bedside  plan of care was discussed  with  Daughter,   Diet  Diet Orders (From admission, onward)     Start     Ordered   08/21/24 2156  Diet NPO time specified Except for: Sips with Meds  Diet effective now       Question:  Except for  Answer:  Noralyn with Meds   08/21/24 2155            Disposition Plan:      Back to current facility when stable                      Following barriers for discharge:                                                         Electrolytes corrected                               Anemia corrected h/H stable                             Pain controlled with PO medications                               Afebrile, white count improving able to transition to PO antibiotics                             Will need to be able to tolerate PO                            Will likely need home health, home O2, set up                           Will need consultants to evaluate patient prior to discharge                           Work of breathing improves  Consult Orders  (From admission, onward)           Start     Ordered   08/21/24 1831  Consult to hospitalist  Pg sent by deloris  18:31  Once       Provider:  (Not yet assigned)  Question Answer Comment  Place call to: [facility]   Reason for Consult Admit      08/21/24 1830                              Transition of care consulted                     Consults called:     [facility] GI Treatment Team:  Nandigam, Kavitha V, MD  Orthopedics [facility]  Hematology Dr. Autumn  IR Dr. Johann  Cardiology     Admission status:  ED Disposition     ED Disposition  Admit   Condition  --   Comment  Hospital Area: [facility] [[id number]]  Level of Care: Progressive [102]  Admit to Progressive based on following criteria: GI, ENDOCRINE disease patients with GI bleeding, acute liver failure or pancreatitis, stable with diabetic ketoacidosis or thyrotoxicosis (hypothyroid) state.  May admit patient to [facility] or [facility] if equivalent level of care is available:: No  Covid Evaluation: Asymptomatic - no recent exposure (last 10 days) testing not required  Diagnosis: Upper GI bleed [[id number]]  Admitting Physician: Creedence Heiss [[id number]]  Attending Physician: Damontre Millea [[id number]]  Certification:: I certify this patient will need inpatient services for at least 2 midnights  Expected  Medical Readiness: 08/24/2024           inpatient     I Expect 2 midnight stay secondary to severity of patient's current illness need for inpatient interventions justified by the following:    Severe lab/radiological/exam abnormalities including:    Hematemesis with nausea septic joint  and extensive comorbidities including:  DM2   CHF CKD  Chronic anticoagulation  That are currently affecting medical management.   I expect  patient to be hospitalized for 2 midnights requiring inpatient medical care.  Patient is at high risk for adverse outcome (such as loss of life or disability) if not treated.  Indication for inpatient stay as follows:   inability to maintain oral hydration   Need for operative/procedural  intervention   Need for IV antibiotics, IV fluids,, IV pain medications,      Level of care     progressive     Ruther Ephraim 08/21/2024, 10:36 PM    [facility]     after 2 AM please page floor coverage   If 7AM-7PM, please contact the day team taking care of the patient using [email or web address]

## 2024-08-21 NOTE — ED Provider Notes (Signed)
 [place] EMERGENCY DEPARTMENT AT [facility] Provider Note  CSN: [id number] Arrival date & time: 08/21/24 1207  Chief Complaint(s) Hematemesis  HPI Beth [name] is a 78 y.o. female with past medical history as below, significant for CKD, DM, HLD, HTN, CVA, CHF, obesity who presents to the ED with complaint of hematemesis  Patient arrives from [facility] secondary hematemesis that started around 2 hours ago.  Patient reports epigastric abdominal discomfort, nausea, vomiting today.  Dried blood noted on face, mixture of bright red and dark red.  She is anticoagulated on Eliquis .  Denies history of similar symptoms in the past.  Denies any blood in her stool.  No chest pain but is having some epigastric abd pain, sharp and stabbing, cramping.  She previously followed with [facility], last visit was around 10 years ago  No fevers or chills, denies any recent falls.  Pt reports pain to her left ear, she had recent ENT surgery, she is on ciprodex   Past Medical History Past Medical History:  Diagnosis Date   Anemia    Arthritis    Knee both knees   Blood transfusion without reported diagnosis 2012   anemia;pt denies transfusion stated was only on iron  tablet   Cataract    left   CKD (chronic kidney disease), stage III (HCC)    Diabetes mellitus without complication (HCC)    Family history of anesthesia complication    sister very slow to awaken after anesthesia;severe vomiting    Gout    left elbow   Herpes infection 08/09/2014   Saw doctor Wed. 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 08/15/2024   History of pulmonary embolism 08/15/2024   Obesity (BMI 30-39.9) 08/03/2024   Acute kidney injury superimposed on chronic kidney disease (HCC) 08/01/2024   Prolonged QT interval 08/01/2024   Elevated troponin 08/01/2024   Hypertensive urgency 08/01/2024   Acute metabolic encephalopathy 08/01/2024   Otitis externa 08/01/2024   Heart failure with preserved ejection fraction (HCC) 08/01/2024   Sore throat 08/01/2024   Debility 08/01/2024   CKD (chronic kidney disease), stage IV (HCC) 07/25/2024   Hospital discharge follow-up 07/25/2024   Stage 2 chronic kidney disease 07/25/2024   Uncontrolled hypertension 07/25/2024   Neck pain, musculoskeletal 07/25/2024   Acute suppurative otitis media of left ear without spontaneous rupture of tympanic membrane 07/25/2024   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 Authorizing Provider  alendronate  (FOSAMAX ) 70 MG tablet TAKE 1 TABLET WEEKLY (TAKE WITH 8OZ OF WATER  30 MINUTES BEFORE BREAKFAST) Patient taking differently: Take 70 mg by mouth every Tuesday. 05/16/24  Yes Zollie Lowers, MD  amoxicillin -clavulanate (AUGMENTIN ) 875-125 MG tablet Take 1 tablet by  mouth 2 (two) times daily. 08/19/24  Yes Dennise Lavada POUR, MD  apixaban  (ELIQUIS ) 5 MG TABS tablet Take 2 tablets (10 mg total) by mouth 2 (two) times daily for 7 days, THEN 1 tablet (5 mg total) 2 (two) times daily. Patient taking differently: Take 1 tablet (5mg ) by mouth twice daily. 08/07/24 09/13/24 Yes Darci Pore, MD  aspirin  EC 81 MG tablet Take 1 tablet (81 mg total) by mouth daily. Swallow whole. 08/08/24  Yes Darci Pore, MD  atorvastatin  (LIPITOR ) 40 MG tablet Take 40 mg by mouth at bedtime. 08/08/24  Yes [provider]  azelastine  (ASTELIN ) 0.1 % nasal spray Place 1 spray into both nostrils 2 (two) times daily. Use in each nostril as directed 07/26/24  Yes [facility] Morton Hummer, Nena, NP  ciprofloxacin -dexamethasone  (CIPRODEX ) OTIC suspension Place 4 drops into the left ear 2 (two) times daily. 08/07/24  Yes Darci Pore, MD  colchicine  0.6 MG tablet Take twice daily for gout attack. (may take every two hours up to 6 doses at acute onset) Patient taking differently: Take 0.6 mg by mouth 2 (two) times daily. 03/03/24  Yes Stacks, Lowers, MD  diltiazem  (CARDIZEM  CD) 240 MG 24 hr capsule TAKE ONE CAPSULE BY MOUTH DAILY 05/16/24  Yes Zollie Lowers, MD  empagliflozin  (JARDIANCE ) 10 MG TABS tablet Take 1 tablet (10 mg total) by mouth daily. 09/29/23  Yes Zollie Lowers, MD  ferrous sulfate  325 (65 FE) MG tablet Take 325 mg by mouth 2 (two) times a week. Take one tablet by mouth on Tue & Thurs.   Yes [provider]  hydrALAZINE  (APRESOLINE ) 25 MG tablet TAKE ONE (1) TABLET BY MOUTH 3 TIMES DAILY 07/18/24  Yes Zollie Lowers, MD  HYDROcodone -acetaminophen  (NORCO/VICODIN) 5-325 MG tablet Take 1 tablet by mouth every 6 (six) hours as needed for moderate pain (pain score 4-6). 08/19/24  Yes Singh, Prashant K, MD  hydrocortisone  cream 1 % Apply topically 2 (two) times daily. To left ear 08/19/24  Yes Dennise Lavada POUR, MD  isosorbide  mononitrate (IMDUR ) 60 MG 24 hr  tablet Take 1 tablet (60 mg total) by mouth daily. 08/08/24  Yes Darci Pore, MD  ondansetron  (ZOFRAN -ODT) 4 MG disintegrating tablet Take 4 mg by mouth every 8 (eight) hours as needed for vomiting or nausea. 08/08/24  Yes [provider]  valACYclovir  (VALTREX ) 500 MG tablet Take 1 tablet (500 mg total) by mouth 2 (two) times daily. Patient not taking: Reported on 08/21/2024 08/19/24   Singh, Prashant K, MD  Past Surgical History Past Surgical History:  Procedure Laterality Date   ABDOMINAL HYSTERECTOMY  1983   CARPAL TUNNEL RELEASE Right 1983   colonscopy  June 21, 2014   EXCISIONAL TOTAL KNEE ARTHROPLASTY WITH ANTIBIOTIC SPACERS Right 02/16/2015   Procedure: RIGHT KNEE RESECTION ARTHROPLASTY WITH ANTIBIOTIC SPACERS;  Surgeon: Dempsey Moan, MD;  Location: [facility];  Service: Orthopedics;  Laterality: Right;   EXCISIONAL TOTAL KNEE ARTHROPLASTY WITH ANTIBIOTIC SPACERS Right 11/03/2018   Procedure: Right knee resection arthroplasty; antibiotic spacer;  Surgeon: Moan Dempsey, MD;  Location: [facility];  Service: Orthopedics;  Laterality: Right;  Adductor Block   I & D KNEE WITH POLY EXCHANGE Right 10/02/2014   Procedure: IRRIGATION AND DEBRIDEMENT RIGHT KNEE WITH POLY EXCHANGE;  Surgeon: Dempsey Moan GAILS, MD;  Location: [facility];  Service: Orthopedics;  Laterality: Right;   I & D KNEE WITH POLY EXCHANGE Right 08/27/2020   Procedure: IRRIGATION AND DEBRIDEMENT; SPACER EXCHANGE RIGHT KNEE with multiple specimens;  Surgeon: Moan Dempsey, MD;  Location: [facility];  Service: Orthopedics;  Laterality: Right;    INCISION AND DRAINAGE OF WOUND Right 01/14/2017   Procedure: IRRIGATION AND DEBRIDEMENT WOUND;  Surgeon: Dempsey Moan, MD;  Location: [facility];  Service: Orthopedics;  Laterality: Right;  requests   JOINT REPLACEMENT  06/2014   right knee   MYRINGOTOMY  WITH TUBE PLACEMENT Left 08/15/2024   Procedure: MYRINGOTOMY WITH LEFT EAR TUBE PLACEMENT;  Surgeon: Roark Rush, MD;  Location: [facility];  Service: ENT;  Laterality: Left;   nasal cauterization  2012   PATELLAR TENDON REPAIR Right 08/11/2014   Procedure: RIGHT PATELLA TENDON REPAIR;  Surgeon: Dempsey Moan GAILS, MD;  Location: [facility];  Service: Orthopedics;  Laterality: Right;   REIMPLANTATION OF TOTAL KNEE Right 05/23/2015   Procedure: RIGHT KNEE ARTHROPLASTY REIMPLANTATION;  Surgeon: Dempsey Moan, MD;  Location: [facility];  Service: Orthopedics;  Laterality: Right;   TOTAL KNEE ARTHROPLASTY Right 07/03/2014   Procedure: RIGHT TOTAL KNEE ARTHROPLASTY;  Surgeon: Dempsey Moan GAILS, MD;  Location: [facility];  Service: Orthopedics;  Laterality: Right;   TUBAL LIGATION     Family History Family History  Problem Relation Age of Onset   Ovarian cancer Mother    Cancer Mother    Peripheral vascular disease Father        with amputation of both legs   Hypertension Father    Heart disease Brother 55   Kidney disease Daughter    Heart disease Daughter 66   Diabetes Son    Diabetes Son    Colon cancer Neg Hx    Esophageal cancer Neg Hx    Stomach cancer Neg Hx    Rectal cancer Neg Hx     Social History Social History   Tobacco Use   Smoking status: Never   Smokeless tobacco: Never  Vaping Use   Vaping status: Never Used  Substance Use Topics   Alcohol use: No   Drug use: No   Allergies Cefdinir, Aleve [naproxen sodium], Asa [aspirin ], Ciprofloxacin , Clonidine  derivatives, and Shellfish allergy  Review of Systems A thorough review of systems was obtained and all systems are negative except as noted in the HPI and PMH.   Physical Exam Vital Signs  I have reviewed the triage vital signs BP (!) 154/109   Pulse 85   Temp 99.7 F (37.6 C) (Axillary)   Resp 17   SpO2 100%  Physical Exam Vitals and nursing note reviewed.  Constitutional:      General:  She is not in acute distress.     Appearance: Normal appearance. She is well-developed. She is obese. She is ill-appearing.  HENT:     Head: Normocephalic and atraumatic.     Comments: Dried blood noted on face and lips    Right Ear: External ear normal.     Ears:     Comments: Left external ear is abnormal, pos otitis externa, no evidence of malignant otitis externa     Nose: Nose normal.     Mouth/Throat:     Mouth: Mucous membranes are moist.  Eyes:     General: No scleral icterus.       Right eye: No discharge.        Left eye: No discharge.  Cardiovascular:     Rate and Rhythm: Normal rate.  Pulmonary:     Effort: Pulmonary effort is normal. No respiratory distress.     Breath sounds: No stridor.  Abdominal:     General: Abdomen is flat. There is no distension.     Palpations: Abdomen is soft.     Tenderness: There is abdominal tenderness. There is no guarding.   Musculoskeletal:        General: No deformity.     Cervical back: No rigidity.  Skin:    General: Skin is warm and dry.     Coloration: Skin is not cyanotic, jaundiced or pale.  Neurological:     Mental Status: She is alert.  Psychiatric:        Speech: Speech normal.        Behavior: Behavior normal. Behavior is cooperative.     ED Results and Treatments Labs (all labs ordered are listed, but only abnormal results are displayed) Labs Reviewed  CBC WITH DIFFERENTIAL/PLATELET - Abnormal; Notable for the following components:      Result Value   RBC 3.80 (*)    Hemoglobin 10.2 (*)    HCT 32.6 (*)    RDW 16.0 (*)    All other components within normal limits  COMPREHENSIVE METABOLIC PANEL WITH GFR - Abnormal; Notable for the following components:   CO2 20 (*)    Glucose, Bld 132 (*)    BUN 29 (*)    Creatinine, Ser 2.13 (*)    Calcium  8.3 (*)    Albumin  2.2 (*)    GFR, Estimated 23 (*)    All other components within normal limits  PROTIME-INR - Abnormal; Notable for the following components:   Prothrombin Time 22.3 (*)    INR 1.9  (*)    All other components within normal limits  RESP PANEL BY RT-PCR (RSV, FLU A&B, COVID)  RVPGX2  LIPASE, BLOOD  URINALYSIS, ROUTINE W REFLEX MICROSCOPIC  CBC  CBC  CBC  TYPE AND SCREEN  TROPONIN I (HIGH SENSITIVITY)  Radiology CT Knee Right Wo Contrast Result Date: 08/21/2024 CLINICAL DATA:  Hardware failure, right knee EXAM: CT OF THE RIGHT KNEE WITHOUT CONTRAST TECHNIQUE: Multidetector CT imaging of the right knee was performed according to the standard protocol. Multiplanar CT image reconstructions were also generated. RADIATION DOSE REDUCTION: This exam was performed according to the departmental dose-optimization program which includes automated exposure control, adjustment of the mA and/or kV according to patient size and/or use of iterative reconstruction technique. 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 Two threaded rods are present, 1 of which extends from the pre tibial subcutaneous tissues below the tibial tubercle and into the tibial component of the methacrylate proximally to the level of a transverse lucent plane through the methacrylate as shown on image 71 series 7. Second rod extends from the native bone proximal tibia through the tibial methacrylate but the rod is fractured at the level of a lucent plane between the tibial and the femoral component of the methacrylate with 8 mm of displacement as shown on image 67 series 7. The distal fragment continues through the femoral cavity component of the spacer in which it terminates near the proximal tip of the spacer. The femoral component of the spacer is separated from the  native bone by lucency of variable thickness, between 2 and 6 mm. The tibial component is closely applied to the anterior tibia but has up to 6 mm of lucency around its posteromedial margin. With the fracture in the rod along the plane between the femoral and tibial component of the spacer, there is resulting pseudoarticulation with a non rigid attachment as shown on image 67 series 7. The patellar is flattened with associated volume loss. Capsular calcifications anteriorly in the knee joint and scattered elsewhere in the knee joint. Ligaments Suboptimally assessed by CT. Muscles and Tendons Severe regional muscular atrophy. Soft tissues Subcutaneous edema anterior to the patella and patellar tendon region. Atheromatous vascular calcifications. IMPRESSION: 1. Antibiotic impregnated spacer in the distal femoral and proximal tibial cavities is partially fragmented, and the traversing threaded rod is fractured resulting in a pseudoarticulation. 2. The femoral component of the spacer is separated from the native bone by lucency of variable thickness, between 2 and 6 mm. 3. The tibial component is closely applied to the anterior tibia but has up to 6 mm of lucency around its posteromedial margin. 4. The patellar is flattened with associated volume loss. 5. Capsular calcifications anteriorly in the knee joint and scattered elsewhere in the knee joint. 6. Severe regional muscular atrophy. 7. Subcutaneous edema anterior to the patella and patellar tendon region. Electronically Signed   By: Ryan Salvage M.D.   On: 08/21/2024 18:38   UE VENOUS DUPLEX (7am - 7pm) Result Date: 08/21/2024 UPPER VENOUS STUDY  Patient Name:  Beth [name]  Date of Exam:   08/21/2024 Medical Rec #: [id number]     Accession #:    [id number] Date of Birth: [birth year]     Patient Gender: F Patient Age:   84 years Exam Location:  [facility] Procedure:      VAS US  UPPER EXTREMITY VENOUS DUPLEX Referring Phys: JAYSON PEREYRA  --------------------------------------------------------------------------------  Indications: Pain, and Swelling of left upper extremity Risk Factors: 08/06/2024 PE History of gout. Anticoagulation: Eliquis . Limitations: Poor ultrasound/tissue interface, musculoskeletal features and  Edema. Comparison Study: No prior upper extremity venous duplex on file Performing Technologist: Alberta Lis RVS  Examination Guidelines: A complete evaluation includes B-mode imaging, spectral Doppler, color Doppler, and power Doppler as needed of all accessible portions of each vessel. Bilateral testing is considered an integral part of a complete examination. Limited examinations for reoccurring indications may be performed as noted.  Right Findings: +----------+------------+---------+-----------+----------+-------+ RIGHT     CompressiblePhasicitySpontaneousPropertiesSummary +----------+------------+---------+-----------+----------+-------+ Subclavian               Yes       No                       +----------+------------+---------+-----------+----------+-------+  Left Findings: +----------+------------+---------+-----------+----------+-------+ LEFT      CompressiblePhasicitySpontaneousPropertiesSummary +----------+------------+---------+-----------+----------+-------+ IJV           Full       Yes       No                       +----------+------------+---------+-----------+----------+-------+ Subclavian               Yes       No                       +----------+------------+---------+-----------+----------+-------+ Axillary                 Yes       No                       +----------+------------+---------+-----------+----------+-------+ Brachial                 Yes       No                       +----------+------------+---------+-----------+----------+-------+ Radial        Full                                           +----------+------------+---------+-----------+----------+-------+ Ulnar         Full                                          +----------+------------+---------+-----------+----------+-------+ Cephalic      Full                                          +----------+------------+---------+-----------+----------+-------+ Basilic                  Yes       No                       +----------+------------+---------+-----------+----------+-------+  Summary:  Right: No evidence of thrombosis in the subclavian.  Left: No obvious evidence of deep vein thrombosis in the visualized veins of the upper extremity. No evidence of superficial vein thrombosis in the visualized veins of the upper extremity. Vessels in the upper arm are tortuous. Doppler waveforms are pulsatile throughout. Cystic structure noted lateral arm near [facility] measuring 2.2 X 1.69 cm, etiology unknown. Heterogenous area noted lateral proximal forearm, etiology unknown.  Further imaging may be warranted if clinically indicated.Limited visualization secondary to limitations stated above.  *See table(s) above for measurements and observations.     Preliminary    CT Temporal Bones Wo Contrast Result Date: 08/21/2024 EXAM: CT TEMPORAL BONES WITHOUT CONTRAST 08/21/2024 03:26:31 PM TECHNIQUE: CT of the temporal bones was performed without the administration of intravenous contrast. Multiplanar reformatted images are provided for review. Automated exposure control, iterative reconstruction, and/or weight based adjustment of the mA/kV was utilized to reduce the radiation dose to as low as reasonably achievable. COMPARISON: None available. CLINICAL HISTORY: Left ear pain s/p myringotomy 5 days ago. FINDINGS: RIGHT TEMPORAL BONE: EXTERNAL AUDITORY CANAL: Clear. No bony erosion. Scutum is intact. MIDDLE EAR CAVITY: Clear. Ossicular chain is intact. MASTOID AIR CELLS: Clear. INNER EAR: The cochlea and vestibule are unremarkable. Normal mineralization  of the otic capsule. Normal semicircular canals. The vestibular aqueduct is not dilated. INTERNAL AUDITORY CANAL: Unremarkable. Normal bony canal of the facial nerve. LEFT TEMPORAL BONE: EXTERNAL AUDITORY CANAL: Opacified. The tympanic membrane is not excludedly visualized. MIDDLE EAR CAVITY: Opacified. Ossicles are intact. No focal osseous erosions are present. Fluid or soft tissue extend into the epitympanum. MASTOID AIR CELLS: Effusion is present without coalescence. INNER EAR: The cochlea and vestibule are unremarkable. Normal mineralization of the otic capsule. Normal semicircular canals. The vestibular aqueduct is not dilated. INTERNAL AUDITORY CANAL: Unremarkable. Normal bony canal of the facial nerve. VASCULAR: Atherosclerotic calcifications are present in the cavernous carotid arteries bilaterally. No hyperdense vessel is present. BRAIN: Unremarkable. ORBITS: No acute abnormality. SINUSES: Right maxillary sinus is chronically opacified. Mild mucosal thickening is present within the anterior right ethmoid air cells. IMPRESSION: 1. Opacification of the left external auditory canal and middle ear cavity with extension into the epitympanum. Effusion in the left mastoid air cells without coalescence. No osseous erosion. 2. Chronically opacified right maxillary sinus and mild mucosal thickening in the anterior right ethmoid air cells. Electronically signed by: Lonni Necessary MD 08/21/2024 04:19 PM EDT RP Workstation: [id number]   CT CHEST ABDOMEN PELVIS WO CONTRAST Result Date: 08/21/2024 CLINICAL DATA:  Epigastric pain EXAM: CT CHEST, ABDOMEN AND PELVIS WITHOUT CONTRAST TECHNIQUE: Multidetector CT imaging of the chest, abdomen and pelvis was performed following the standard protocol without IV contrast. RADIATION DOSE REDUCTION: This exam was performed according to the departmental dose-optimization program which includes automated exposure control, adjustment of the mA and/or kV according to patient  size and/or use of iterative reconstruction technique. COMPARISON:  None Available. FINDINGS: CT CHEST FINDINGS Cardiovascular: Aortic atherosclerosis. Cardiomegaly. Extensive three-vessel coronary artery calcifications and or stents. No pericardial effusion. Mediastinum/Nodes: No enlarged mediastinal, hilar, or axillary lymph nodes. Thyroid  gland, trachea, and esophagus demonstrate no significant findings. Lungs/Pleura: Dependent bibasilar scarring or atelectasis. No pleural effusion or pneumothorax. Musculoskeletal: No chest wall abnormality. No acute osseous findings. CT ABDOMEN PELVIS FINDINGS Hepatobiliary: No solid liver abnormality is seen. Tiny gallstones and/or gravel (series 3, image 66). No gallbladder wall thickening, or biliary dilatation. Pancreas: Unremarkable. No pancreatic ductal dilatation or surrounding inflammatory changes. Spleen: Normal in size without significant abnormality. Adrenals/Urinary Tract: Adrenal glands are unremarkable. Small nonobstructive bilateral renal calculi and or renal vascular calcifications. No ureteral calculi or hydronephrosis. Numerous phleboliths throughout the low pelvis, which are not favored to reflect urinary tract calculi. Bladder is unremarkable. Stomach/Bowel: Stomach is within normal limits. Appendix not clearly visualized. No evidence of bowel wall thickening, distention, or inflammatory changes. Sigmoid diverticulosis. Vascular/Lymphatic: No significant vascular findings are present. No enlarged abdominal or pelvic  lymph nodes. Reproductive: No mass or other abnormality. Other: No abdominal wall hernia or abnormality. No ascites. Musculoskeletal: No acute osseous findings. IMPRESSION: 1. No acute noncontrast CT findings of the chest, abdomen, or pelvis to explain epigastric pain. 2. Tiny gallstones and/or gravel. No CT evidence of acute cholecystitis. 3. Small nonobstructive bilateral renal calculi and or renal vascular calcifications. No ureteral calculi or  hydronephrosis. Numerous phleboliths throughout the pelvis which are not favored to reflect urinary tract calculi. 4. Sigmoid diverticulosis without evidence of acute diverticulitis. 5. Cardiomegaly and coronary artery disease. Aortic Atherosclerosis (ICD10-I70.0). Electronically Signed   By: Marolyn JONETTA Jaksch M.D.   On: 08/21/2024 16:03   DG Knee 2 Views Right Result Date: 08/21/2024 EXAM: 1 or 2 VIEW(S) XRAY OF THE RIGHT KNEE 08/21/2024 12:57:00 PM COMPARISON: MRI from 06/29/2020. CLINICAL HISTORY: Knee pain. Reason for exam: hematemesis, knee pain; Per triage notes: Patient BIB [facility] from [facility] for hematemesis that may have started around 1030. Patient has dried blood on her face and dried blood in an emesis basin. FINDINGS: BONES AND JOINTS: Postsurgical changes of distal femur and proximal tibia status post removal of right knee arthroplasty device and subsequent placement of cement and intramedullary fixation rods. The fixation rod traversing the knee joint is fractured with posterior displacement of the distal fracture component. Lucency along the cement bone interface is noted. Heterotopic calcification anterior to the distal femur. SOFT TISSUES: Severe vascular calcifications. IMPRESSION: 1. Fractured intramedullary fixation rod traversing the knee joint with posterior displacement of the distal fracture component. 2. Lucency along the cement-bone interface, cannot exclude osteomyelitis. 3. Heterotopic calcification anterior to the distal femur. 4. Severe vascular calcifications. Electronically signed by: Waddell Calk MD 08/21/2024 01:22 PM EDT RP Workstation: [id number]   DG Chest Portable 1 View Result Date: 08/21/2024 EXAM: 1 VIEW XRAY OF THE CHEST 08/21/2024 12:57:00 PM COMPARISON: 08/14/2024 CLINICAL HISTORY: Hematemsis. Reason for exam: hematemesis, knee pain; Per triage notes: Patient BIB [facility] from [facility] for hematemesis that may have started around 1030.  Patient has dried blood on her face and dried blood in an emesis basin. FINDINGS: LUNGS AND PLEURA: Retrocardiac opacity is identified obscuring the left hemidiaphragm this may represent atelectasis or airspace disease. No pleural effusion. No pneumothorax. HEART AND MEDIASTINUM: Cardiomegaly. Atherosclerotic calcifications. BONES AND SOFT TISSUES: Multilevel degenerative changes of thoracic spine. No acute osseous abnormality. IMPRESSION: 1. Retrocardiac opacity obscuring the left hemidiaphragm, possibly representing atelectasis or airspace disease. 2. Cardiomegaly and atherosclerotic calcifications. Electronically signed by: Waddell Calk MD 08/21/2024 01:11 PM EDT RP Workstation: [id number]    Pertinent labs & imaging results that were available during my care of the patient were reviewed by me and considered in my medical decision making (see MDM for details).  Medications Ordered in ED Medications  octreotide  (SANDOSTATIN ) 2 mcg/mL load via infusion 50 mcg (50 mcg Intravenous Bolus from Bag 08/21/24 1617)    And  octreotide  (SANDOSTATIN ) 500 mcg in sodium chloride  0.9 % 250 mL (2 mcg/mL) infusion (50 mcg/hr Intravenous New Bag/Given 08/21/24 1620)  pantoprazole  (PROTONIX ) injection 40 mg (has no administration in time range)  pantoprazole  (PROTONIX ) injection 80 mg (80 mg Intravenous Given 08/21/24 1432)  ondansetron  (ZOFRAN ) injection 4 mg (4 mg Intravenous Given 08/21/24 1430)  cefTRIAXone  (ROCEPHIN ) 1 g in sodium chloride  0.9 % 100 mL IVPB (0 g Intravenous Stopped 08/21/24 1622)  sodium chloride  0.9 % bolus 1,000 mL (0 mLs Intravenous Stopped 08/21/24 1755)  famotidine  (PEPCID ) IVPB 20 mg premix (0  mg Intravenous Stopped 08/21/24 1618)  morphine  (PF) 4 MG/ML injection 4 mg (4 mg Intravenous Given 08/21/24 1442)  HYDROmorphone  (DILAUDID ) injection 0.5 mg (0.5 mg Intravenous Given 08/21/24 1616)                                                                                                                                      Procedures Procedures  (including critical care time)  Medical Decision Making / ED Course    Medical Decision Making:    MYLINH CRAGG is a 78 y.o. female with past medical history as below, significant for CKD, DM, HLD, HTN, CVA, CHF, obesity who presents to the ED with complaint of hematemesis. The complaint involves an extensive differential diagnosis and also carries with it a high risk of complications and morbidity.  Serious etiology was considered. Ddx includes but is not limited to: Differential diagnosis includes but is not exclusive to acute cholecystitis, intrathoracic causes for epigastric abdominal pain, gastritis, duodenitis, pancreatitis, small bowel or large bowel obstruction, abdominal aortic aneurysm, hernia, gastritis, etc.   Complete initial physical exam performed, notably the patient was in no acute distress, HDS.    Reviewed and confirmed nursing documentation for past medical history, family history, social history.  Vital signs reviewed.    Epigastric pain Hematemesis > - Hematemesis report onset around 2 hours prior to arrival, multiple episodes of bright red/dark red blood.  She is having ongoing nausea epigastric abdominal pain.  She is anticoagulated Eliquis  with last dose this morning.  Denies any rectal bleeding - Epigastrium TTP, she is HDS>> give Protonix , octreotide , prophylactic Rocephin , hold DOAC (hx PE) - saw [facility] prev - will consult [facility] today> spoke w/ PA, will come see (a [place] PA) plan for EGD tomorrow, hold doac - no further emesis while in ED  Right knee pain > - Dr Hiram I/D R knee w/ spacer exchange last 9/21, right total knee initially 06/2014 - worsening right knee pain last few days, denies recent trauma - XR with fractured intramedullary fixation rod with posterior displacement of distal fx component, ?osteo at cement-bone interface on XR; CT reviewed, does not appear to have osteo but does have abnormality  to surgical rod - initially spoke w/ Dr Burnetta, recommendations noted in ED course. Pt now reports that she does not want any sort of surgical intervention on her knee.  LUE cellulitis > -cellulitis noted at left AC fossa, no clot of duplex -rocephin  started for gi ppx, this should cover for cellulitis here as well, not septic   L ear pain s/p myringotomy > - recent myringotomy - she is on ciprodex  - worsening pain tonight > ct temporal stable - continue ciprodex    Clinical Course as of 08/21/24 1922  Sun Aug 21, 2024  1225 ECHO 8/25 w/ LVEF 60-65%, G1DD  [SG]  1621 Spoke w/ Dr Burnetta, will d/w Dr Hiram, recommend med admit will see  in consult  [SG]  1626 Spoke Dr Burnetta, spoke w/ Dr Hiram, recommend send to [facility] and he will see tomorrow. 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 Spoke w/ cardiology who confirms this > rate dependent block  [SG]    Clinical Course User Index [SG] Elnor Jayson LABOR, DO     Spoke w/ [facility] who accepts pt for admit                Additional history obtained: -Additional history obtained from family -External records from outside source obtained and reviewed including: Chart review including previous notes, labs, imaging, consultation notes including  Prior ortho notes Prior c-scopy note [facility]    Lab Tests: -I ordered, reviewed, and interpreted labs.   The pertinent results include:   Labs Reviewed  CBC WITH DIFFERENTIAL/PLATELET - Abnormal; Notable for the following components:      Result Value   RBC 3.80 (*)    Hemoglobin 10.2 (*)    HCT 32.6 (*)    RDW 16.0 (*)    All other components within normal limits  COMPREHENSIVE METABOLIC PANEL WITH GFR - Abnormal; Notable for the following  components:   CO2 20 (*)    Glucose, Bld 132 (*)    BUN 29 (*)    Creatinine, Ser 2.13 (*)    Calcium  8.3 (*)    Albumin  2.2 (*)    GFR, Estimated 23 (*)    All other components within normal limits  PROTIME-INR - Abnormal; Notable for the following components:   Prothrombin Time 22.3 (*)    INR 1.9 (*)    All other components within normal limits  RESP PANEL BY RT-PCR (RSV, FLU A&B, COVID)  [id number]  LIPASE, BLOOD  URINALYSIS, ROUTINE W REFLEX MICROSCOPIC  CBC  CBC  CBC  TYPE AND SCREEN  TROPONIN I (HIGH SENSITIVITY)    Notable for as above   EKG   EKG Interpretation Date/Time:  Sunday August 21 2024 12:14:30 EDT Ventricular Rate:  82 PR Interval:  184 QRS Duration:  88 QT Interval:  366 QTC Calculation: 428 R Axis:   -39  Text Interpretation: Sinus or ectopic atrial rhythm Ventricular premature complex Consider left atrial enlargement Abnormal R-wave progression, late transition Left ventricular hypertrophy Interpretation limited secondary to artifact no stemi Confirmed by Elnor Jayson ([id number]) on 08/21/2024 12:16:30 PM         Imaging Studies ordered: I ordered imaging studies including CT chestabdpelv, ct temporal, ct right knee, duplex LUE, knee/chest xr I independently visualized the following imaging with scope of interpretation limited to determining acute life threatening conditions related to emergency care; findings noted above I agree with the radiologist interpretation If any imaging was obtained with contrast I closely monitored patient for any possible adverse reaction a/w contrast administration in the emergency department   Medicines ordered and prescription drug management: Meds ordered this encounter  Medications   pantoprazole  (PROTONIX ) injection 80 mg   ondansetron  (ZOFRAN ) injection 4 mg   cefTRIAXone  (ROCEPHIN ) 1 g in sodium chloride  0.9 % 100 mL IVPB    Antibiotic Indication::   Intra-abdominal    Other Indication::   UGIB ppx   sodium  chloride 0.9 % bolus 1,000 mL   AND Linked Order  Group    octreotide  (SANDOSTATIN ) 2 mcg/mL load via infusion 50 mcg    octreotide  (SANDOSTATIN ) 500 mcg in sodium chloride  0.9 % 250 mL (2 mcg/mL) infusion   famotidine  (PEPCID ) IVPB 20 mg premix   morphine  (PF) 4 MG/ML injection 4 mg   HYDROmorphone  (DILAUDID ) injection 0.5 mg   pantoprazole  (PROTONIX ) injection 40 mg    -I have reviewed the patients home medicines and have made adjustments as needed   Consultations Obtained: I requested consultation with the ortho, cardiology, gi, [facility],  and discussed lab and imaging findings as well as pertinent plan - they recommend: admit   Cardiac Monitoring: The patient was maintained on a cardiac monitor.  I personally viewed and interpreted the cardiac monitored which showed an underlying rhythm of: nsr > intermittent LBBB Continuous pulse oximetry interpreted by myself, 99% on RA.    Social Determinants of Health:  Diagnosis or treatment significantly limited by social determinants of health: obesity   Reevaluation: After the interventions noted above, I reevaluated the patient and found that they have improved  Co morbidities that complicate the patient evaluation  Past Medical History:  Diagnosis Date   Anemia    Arthritis    Knee both knees   Blood transfusion without reported diagnosis 2012   anemia;pt denies transfusion stated was only on iron  tablet   Cataract    left   CKD (chronic kidney disease), stage III (HCC)    Diabetes mellitus without complication (HCC)    Family history of anesthesia complication    sister very slow to awaken after anesthesia;severe vomiting    Gout    left elbow   Herpes infection 08/09/2014   Saw doctor Wed. 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    Final Clinical Impression(s) / ED Diagnoses Final diagnoses:  Hematemesis with nausea  Acute pain of right knee  Anticoagulated  Cellulitis of left upper extremity  Chronic pain of right knee  Left bundle branch block        Elnor Jayson LABOR, DO 08/21/24 1922

## 2024-08-21 NOTE — H&P (View-Only) (Signed)
 Consultation  Referring Provider: ER MD/Gray Primary Care Physician:  Zollie Lowers, MD Primary Gastroenterologist:  Dr. Victorine  Reason for Consultation: Hematemesis  HPI: Beth [name] is a 78 y.o. female with history of chronic kidney disease stage III, hypertension, hyperlipidemia, CVA, history of PE for which she is on chronic Eliquis  who was just discharged from the hospital on 08/19/2024.  She was here for 5 days with primary complaint of left ear pain and family was concerned about facial droop on the left.  She was diagnosed with left otitis externa and otitis media with some compression of the left facial nerve, she had also recently had a left ear herpetic breakout.  She required myringotomy per ENT.  She was discharged on Augmentin  and valacyclovir .. Patient was brought back to the emergency room this afternoon after she had vomited several times at her nursing facility.  This was described as dark red blood.  When she arrived to the emergency room she had dried blood on her face.  Patient says she vomited about 4 or 5 times.  She has not had any melena or hematochezia.  She has no complaints of abdominal pain but says she has been very nauseated and that had started when she was in the hospital last time.  This is the first time she vomited. Last dose of Eliquis  earlier today She did not syncopized. On arrival to the ER actually hypertensive with blood pressure 182/110  Labs in the ER-WBC 8.1/hemoglobin 10.2/hematocrit 32.6/MCV 85/platelets 398 Pro time 22.3/INR 1.9 Lipase within normal limits Sodium 137/potassium 3.8/BUN 29/creatinine 2.13 LFTs normal Respiratory panel negative Troponin pending  Patient's hemoglobin on discharge from hospital on 08/19/2024 was 9.1  Patient has been having fairly long runs of what appears to be V. tach, has maintained pulse and blood pressure and per ER MD has a bundle branch block which is giving appearance of V. tach.  She is being  monitored  She had films of her right knee as she had expressed that her knee was hurting when they were moving her around, she has a brace on her knee that she wears chronically.  She has a fractured intramedullary fixation rod traversing the knee joint with posterior displacement of the distal fracture component, lucency along the cement bone interface cannot exclude osteomyelitis  Patient's son who is present in the room says that he was with her earlier this morning and she was feeling fine, he had come to visit for the weekend and was leaving to go back home to [place] when they were called about the hematemesis.  He relates that she has had 7 surgeries on that knee in the past and has voiced that she will not have any more surgeries on that knee  She has not had any prior issues with GI bleeding, no prior EGDs.  She did have previous prior colonoscopies, last in 2015 with finding of diverticulosis, no recurrent colon polyps.  She has also been on Fosamax .    Past Medical History:  Diagnosis Date   Anemia    Arthritis    Knee both knees   Blood transfusion without reported diagnosis 2012   anemia;pt denies transfusion stated was only on iron  tablet   Cataract    left   CKD (chronic kidney disease), stage III (HCC)    Diabetes mellitus without complication (HCC)    Family history of anesthesia complication    sister very slow to awaken after anesthesia;severe vomiting    Gout  left elbow   Herpes infection 08/09/2014   Saw doctor Wed. 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   Other acute osteomyelitis, right femur (HCC) 07/23/2020   Pseudomonas aeruginosa infection 12/15/2018   Pseudomonas aeruginosa infection 12/15/2018   Septic arthritis of knee, right  (HCC) 08/27/2020   Stroke (HCC) 2006   x 1 no deficits noted     Past Surgical History:  Procedure Laterality Date   ABDOMINAL HYSTERECTOMY  1983   CARPAL TUNNEL RELEASE Right 1983   colonscopy  June 21, 2014   EXCISIONAL TOTAL KNEE ARTHROPLASTY WITH ANTIBIOTIC SPACERS Right 02/16/2015   Procedure: RIGHT KNEE RESECTION ARTHROPLASTY WITH ANTIBIOTIC SPACERS;  Surgeon: Dempsey Moan, MD;  Location: [facility];  Service: Orthopedics;  Laterality: Right;   EXCISIONAL TOTAL KNEE ARTHROPLASTY WITH ANTIBIOTIC SPACERS Right 11/03/2018   Procedure: Right knee resection arthroplasty; antibiotic spacer;  Surgeon: Moan Dempsey, MD;  Location: [facility];  Service: Orthopedics;  Laterality: Right;  Adductor Block   I & D KNEE WITH POLY EXCHANGE Right 10/02/2014   Procedure: IRRIGATION AND DEBRIDEMENT RIGHT KNEE WITH POLY EXCHANGE;  Surgeon: Dempsey Moan GAILS, MD;  Location: [facility];  Service: Orthopedics;  Laterality: Right;   I & D KNEE WITH POLY EXCHANGE Right 08/27/2020   Procedure: IRRIGATION AND DEBRIDEMENT; SPACER EXCHANGE RIGHT KNEE with multiple specimens;  Surgeon: Moan Dempsey, MD;  Location: [facility];  Service: Orthopedics;  Laterality: Right;    INCISION AND DRAINAGE OF WOUND Right 01/14/2017   Procedure: IRRIGATION AND DEBRIDEMENT WOUND;  Surgeon: Dempsey Moan, MD;  Location: [facility];  Service: Orthopedics;  Laterality: Right;  requests   JOINT REPLACEMENT  06/2014   right knee   MYRINGOTOMY WITH TUBE PLACEMENT Left 08/15/2024   Procedure: MYRINGOTOMY WITH LEFT EAR TUBE PLACEMENT;  Surgeon: Roark Rush, MD;  Location[facility];  Service: ENT;  Laterality: Left;   nasal cauterization  2012   PATELLAR TENDON REPAIR Right 08/11/2014   Procedure: RIGHT PATELLA TENDON REPAIR;  Surgeon: Dempsey Moan GAILS, MD;  Location: [facility];  Service: Orthopedics;  Laterality: Right;   REIMPLANTATION OF TOTAL KNEE Right 05/23/2015   Procedure: RIGHT KNEE ARTHROPLASTY REIMPLANTATION;  Surgeon: Dempsey Moan, MD;   Location: [facility];  Service: Orthopedics;  Laterality: Right;   TOTAL KNEE ARTHROPLASTY Right 07/03/2014   Procedure: RIGHT TOTAL KNEE ARTHROPLASTY;  Surgeon: Dempsey Moan GAILS, MD;  Location: [facility];  Service: Orthopedics;  Laterality: Right;   TUBAL LIGATION      Prior to Admission medications   Medication Sig Start Date End Date Taking? Authorizing Provider  alendronate  (FOSAMAX ) 70 MG tablet TAKE 1 TABLET WEEKLY (TAKE WITH 8OZ OF WATER  30 MINUTES BEFORE BREAKFAST) Patient taking differently: Take 70 mg by mouth every Tuesday. 05/16/24  Yes Zollie Lowers, MD  amoxicillin -clavulanate (AUGMENTIN ) 875-125 MG tablet Take 1 tablet by mouth 2 (two) times daily. 08/19/24  Yes Dennise Lavada POUR, MD  apixaban  (ELIQUIS ) 5 MG TABS tablet Take 2 tablets (10 mg total) by mouth 2 (two) times daily for 7 days, THEN 1 tablet (5 mg total) 2 (two) times daily. Patient taking differently: Take 1 tablet (5mg ) by mouth twice daily. 08/07/24 09/13/24 Yes Darci Pore, MD  aspirin  EC 81 MG tablet Take 1 tablet (81  mg total) by mouth daily. Swallow whole. 08/08/24  Yes Darci Pore, MD  atorvastatin  (LIPITOR ) 40 MG tablet Take 40 mg by mouth at bedtime. 08/08/24  Yes [provider]  azelastine  (ASTELIN ) 0.1 % nasal spray Place 1 spray into both nostrils 2 (two) times daily. Use in each nostril as directed 07/26/24  Yes [facility] Morton Hummer, Nena, NP  ciprofloxacin -dexamethasone  (CIPRODEX ) OTIC suspension Place 4 drops into the left ear 2 (two) times daily. 08/07/24  Yes Darci Pore, MD  colchicine  0.6 MG tablet Take twice daily for gout attack. (may take every two hours up to 6 doses at acute onset) Patient taking differently: Take 0.6 mg by mouth 2 (two) times daily. 03/03/24  Yes Zollie Lowers, MD  diltiazem  (CARDIZEM  CD) 240 MG 24 hr capsule TAKE ONE CAPSULE BY MOUTH DAILY 05/16/24  Yes Zollie Lowers, MD  empagliflozin  (JARDIANCE ) 10 MG TABS tablet Take 1 tablet (10 mg total) by mouth daily.  09/29/23  Yes Zollie Lowers, MD  ferrous sulfate  325 (65 FE) MG tablet Take 325 mg by mouth 2 (two) times a week. Take one tablet by mouth on Tue & Thurs.   Yes [provider]  hydrALAZINE  (APRESOLINE ) 25 MG tablet TAKE ONE (1) TABLET BY MOUTH 3 TIMES DAILY 07/18/24  Yes Zollie Lowers, MD  HYDROcodone -acetaminophen  (NORCO/VICODIN) 5-325 MG tablet Take 1 tablet by mouth every 6 (six) hours as needed for moderate pain (pain score 4-6). 08/19/24  Yes Singh, Prashant K, MD  hydrocortisone  cream 1 % Apply topically 2 (two) times daily. To left ear 08/19/24  Yes Dennise Lavada POUR, MD  isosorbide  mononitrate (IMDUR ) 60 MG 24 hr tablet Take 1 tablet (60 mg total) by mouth daily. 08/08/24  Yes Darci Pore, MD  ondansetron  (ZOFRAN -ODT) 4 MG disintegrating tablet Take 4 mg by mouth every 8 (eight) hours as needed for vomiting or nausea. 08/08/24  Yes [provider]  valACYclovir  (VALTREX ) 500 MG tablet Take 1 tablet (500 mg total) by mouth 2 (two) times daily. Patient not taking: Reported on 08/21/2024 08/19/24   Singh, Prashant K, MD    Current Facility-Administered Medications  Medication Dose Route Frequency Provider Last Rate Last Admin   octreotide  (SANDOSTATIN ) 500 mcg in sodium chloride  0.9 % 250 mL (2 mcg/mL) infusion  50 mcg/hr Intravenous Continuous Elnor Savant A, DO 25 mL/hr at 08/21/24 1620 50 mcg/hr at 08/21/24 1620   pantoprazole  (PROTONIX ) injection 40 mg  40 mg Intravenous Q12H Esterwood, Amy S, PA-C       Current Outpatient Medications  Medication Sig Dispense Refill   alendronate  (FOSAMAX ) 70 MG tablet TAKE 1 TABLET WEEKLY (TAKE WITH 8OZ OF WATER  30 MINUTES BEFORE BREAKFAST) (Patient taking differently: Take 70 mg by mouth every Tuesday.) 12 tablet 1   amoxicillin -clavulanate (AUGMENTIN ) 875-125 MG tablet Take 1 tablet by mouth 2 (two) times daily.     apixaban  (ELIQUIS ) 5 MG TABS tablet Take 2 tablets (10 mg total) by mouth 2 (two) times daily for 7 days, THEN 1  tablet (5 mg total) 2 (two) times daily. (Patient taking differently: Take 1 tablet (5mg ) by mouth twice daily.) 60 tablet 2   aspirin  EC 81 MG tablet Take 1 tablet (81 mg total) by mouth daily. Swallow whole. 30 tablet 1   atorvastatin  (LIPITOR ) 40 MG tablet Take 40 mg by mouth at bedtime.     azelastine  (ASTELIN ) 0.1 % nasal spray Place 1 spray into both nostrils 2 (two) times daily. Use in each nostril as directed 30  mL 12   ciprofloxacin -dexamethasone  (CIPRODEX ) OTIC suspension Place 4 drops into the left ear 2 (two) times daily. 7.5 mL 0   colchicine  0.6 MG tablet Take twice daily for gout attack. (may take every two hours up to 6 doses at acute onset) (Patient taking differently: Take 0.6 mg by mouth 2 (two) times daily.) 60 tablet 2   diltiazem  (CARDIZEM  CD) 240 MG 24 hr capsule TAKE ONE CAPSULE BY MOUTH DAILY 90 capsule 1   empagliflozin  (JARDIANCE ) 10 MG TABS tablet Take 1 tablet (10 mg total) by mouth daily. 90 tablet 3   ferrous sulfate  325 (65 FE) MG tablet Take 325 mg by mouth 2 (two) times a week. Take one tablet by mouth on Tue & Thurs.     hydrALAZINE  (APRESOLINE ) 25 MG tablet TAKE ONE (1) TABLET BY MOUTH 3 TIMES DAILY 90 tablet 0   HYDROcodone -acetaminophen  (NORCO/VICODIN) 5-325 MG tablet Take 1 tablet by mouth every 6 (six) hours as needed for moderate pain (pain score 4-6). 5 tablet 0   hydrocortisone  cream 1 % Apply topically 2 (two) times daily. To left ear 30 g 0   isosorbide  mononitrate (IMDUR ) 60 MG 24 hr tablet Take 1 tablet (60 mg total) by mouth daily. 30 tablet 1   ondansetron  (ZOFRAN -ODT) 4 MG disintegrating tablet Take 4 mg by mouth every 8 (eight) hours as needed for vomiting or nausea.     valACYclovir  (VALTREX ) 500 MG tablet Take 1 tablet (500 mg total) by mouth 2 (two) times daily. (Patient not taking: Reported on 08/21/2024)      Allergies as of 08/21/2024 - Review Complete 08/21/2024  Allergen Reaction Noted   Cefdinir Swelling and Rash 07/25/2024   Aleve  [naproxen sodium] Other (See Comments) 02/06/2015   Dorethia Numbers ] Other (See Comments) 02/28/2016   Ciprofloxacin  Other (See Comments) 08/22/2019   Clonidine  derivatives Other (See Comments) 09/22/2013   Shellfish allergy Nausea And Vomiting 06/16/2014    Family History  Problem Relation Age of Onset   Ovarian cancer Mother    Cancer Mother    Peripheral vascular disease Father        with amputation of both legs   Hypertension Father    Heart disease Brother 79   Kidney disease Daughter    Heart disease Daughter 80   Diabetes Son    Diabetes Son    Colon cancer Neg Hx    Esophageal cancer Neg Hx    Stomach cancer Neg Hx    Rectal cancer Neg Hx     Social History   Socioeconomic History   Marital status: Widowed    Spouse name: Not on file   Number of children: 9   Years of education: 8th   Highest education level: 8th grade  Occupational History    Employer: RETIRED  Tobacco Use   Smoking status: Never   Smokeless tobacco: Never  Vaping Use   Vaping status: Never Used  Substance and Sexual Activity   Alcohol use: No   Drug use: No   Sexual activity: Not Currently  Other Topics Concern   Not on file  Social History Narrative   She has 6 living adult children and 3 who have passed away and many grandchildren and great grandchildren. Patient is retired. She worked part time Education officer, environmental houses and a Research officer, trade union.    One child in [place], others in [place], [place], [place], and [place]   Social Drivers of Health   Financial Resource Strain: Low Risk  (07/20/2024)  Received from [facility]   Overall Financial Resource Strain (CARDIA)    How hard is it for you to pay for the very basics like food, housing, medical care, and heating?: Not hard at all  Food Insecurity: No Food Insecurity (08/15/2024)   Hunger Vital Sign    Worried About Running Out of Food in the Last Year: Never true    Ran Out of Food in the Last Year: Never true  Transportation Needs: No Transportation  Needs (08/15/2024)   PRAPARE - Administrator, Civil Service (Medical): No    Lack of Transportation (Non-Medical): No  Recent Concern: Transportation Needs - Unmet Transportation Needs (07/20/2024)   Received from [facility] - Transportation    Lack of Transportation (Medical): Yes    Lack of Transportation (Non-Medical): Yes  Physical Activity: Inactive (07/20/2024)   Received from [facility]   Exercise Vital Sign    On average, how many days per week do you engage in moderate to strenuous exercise (like a brisk walk)?: 0 days    On average, how many minutes do you engage in exercise at this level?: 0 min  Stress: No Stress Concern Present (07/20/2024)   Received from [facility] - Occupational Stress Questionnaire    Do you feel stress - tense, restless, nervous, or anxious, or unable to sleep at night because your mind is troubled all the time - these days?: Not at all  Social Connections: Moderately Integrated (08/15/2024)   Social Connection and Isolation Panel    Frequency of Communication with Friends and Family: More than three times a week    Frequency of Social Gatherings with Friends and Family: Three times a week    Attends Religious Services: More than 4 times per year    Active Member of Clubs or Organizations: Yes    Attends Banker Meetings: More than 4 times per year    Marital Status: Never married  Recent Concern: Social Connections - Socially Isolated (07/20/2024)   Received from [facility]   Social Connection and Isolation Panel    In a typical week, how many times do you talk on the phone with family, friends, or neighbors?: More than three times a week    How often do you get together with friends or relatives?: Once a week    How often do you attend church or religious services?: Never    Do you belong to any clubs or organizations such as church groups, unions, fraternal  or athletic groups, or school groups?: No    How often do you attend meetings of the clubs or organizations you belong to?: Never    Are you married, widowed, divorced, separated, never married, or living with a partner?: Never married  Intimate Partner Violence: Not At Risk (08/15/2024)   Humiliation, Afraid, Rape, and Kick questionnaire    Fear of Current or Ex-Partner: No    Emotionally Abused: No    Physically Abused: No    Sexually Abused: No    Review of Systems: Pertinent positive and negative review of systems were noted in the above HPI section.  All other review of systems was otherwise negative.   Physical Exam: Vital signs in last 24 hours: Temp:  [98.6 F (37 C)-99.7 F (37.6 C)] 99.7 F (37.6 C) (09/14 1417) Pulse Rate:  [80-88] 85 (09/14 1645) Resp:  [7-30] 17 (09/14 1645) BP: (  154-169)/(77-109) 154/109 (09/14 1645) SpO2:  [73 %-100 %] 100 % (09/14 1645) FiO2 (%):  [100 %] 100 % (09/14 1625)   General:   Alert,  Well-developed, obese elderly African-American female , acutely ill-appearing a bit lethargic but responds appropriately on questioning Head:  Normocephalic and atraumatic. Eyes:  Sclera clear, no icterus.   Conjunctiva pink. Ears:  Normal auditory acuity. Nose:  No deformity, discharge,  or lesions. Mouth:  No deformity or lesions.  Small amount of dried blood around mouth Neck:  Supple; no masses or thyromegaly. Lungs:  Clear throughout to auscultation.   No wheezes, crackles, or rhonchi.  Heart:  Regular rate and rhythm; no murmurs, clicks, rubs,  or gallops. Abdomen:  Soft,nontender, BS active,nonpalp mass or hsm.   Rectal:  Deferred  Msk:  Symmetrical without gross deformities. . Pulses:  Normal pulses noted. Extremities: Left upper extremity swollen, right lower extremity braced Neurologic:  Alert and  oriented x4;  grossly normal neurologically, a bit sleepy Skin:  Intact without significant lesions or rashes.. Psych:  Alert and cooperative.  Normal mood and affect.  Intake/Output from previous day: No intake/output data recorded. Intake/Output this shift: No intake/output data recorded.  Lab Results: Recent Labs    08/19/24 0320 08/21/24 1438  WBC 5.8 8.1  HGB 9.1* 10.2*  HCT 28.7* 32.6*  PLT 304 398   BMET Recent Labs    08/19/24 0320 08/21/24 1438  NA 136 137  K 3.8 3.8  CL 105 104  CO2 19* 20*  GLUCOSE 126* 132*  BUN 32* 29*  CREATININE 2.13* 2.13*  CALCIUM  7.8* 8.3*   LFT Recent Labs    08/21/24 1438  PROT 6.9  ALBUMIN  2.2*  AST 21  ALT 16  ALKPHOS 84  BILITOT 1.0   PT/INR Recent Labs    08/21/24 1438  LABPROT 22.3*  INR 1.9*   Hepatitis Panel No results for input(s): HEPBSAG, HCVAB, HEPAIGM, HEPBIGM in the last 72 hours.   IMPRESSION:  #108 78 year old African-American female brought to the ER from rehab facility after onset of hematemesis earlier this afternoon.  She reportedly had 4-5 episodes prior to arrival to the ER, she has not had any further vomiting here, reported to have had dark red blood.  On Eliquis  Hypertensive on arrival  Patient has been on antibiotics and antiviral, as well as Fosamax . Reports that she had been nauseated over the past week but had not vomited Has not noticed any melena or hematochezia  Hemoglobin here stable from very recent discharge 2 days ago  Etiology of her hematemesis not clear at this time, in setting of Eliquis . Rule out possible Mallory-Weiss tear, ulcerative esophagitis, peptic ulcer disease  #2 coagulopathy secondary to Eliquis  #3 anemia chronic currently stable  #4 patient just discharged from the hospital 08/21/2024 after admission with left facial droop and left ear pain.  Diagnosed with left otitis media, required myringotomy, felt to have nerve compression causing the left facial droop also had had an episode of very recent shingles involving the left ear.  #5 history of PE #6 heart failure with preserved EF #7 chronic  kidney disease stage IV #8 debility in part secondary to chronic right knee injury/pain-multiple prior right knee surgeries prior infection of the prosthetic knee  #9 diabetes mellitus  #10 history of prolonged QT #11 left bundle branch block with current runs in the ER of what appears to be V. tach but has been hemodynamically stable  Plan; n.p.o. for now except sips IV  PPI twice daily Repeat hemoglobin and then serial hemoglobins every 6 hours, transfuse as indicated for hemoglobin less than 7.5.  Discussed with patient, accepting of transfusions. Hold Eliquis  N.p.o. after midnight, patient has been scheduled for EGD with Dr. Stacia tomorrow morning 08/22/2024.  Discussed the procedure in detail with the patient, including indications risks and benefits, her son was also present at bedside and they are agreeable to proceed.  There has been discussion regarding orthopedics seeing the patient for abnormal knee imaging with displacement and possible transfer to [facility] for surgery. GI bleeding needs to be evaluated and sorted out prior to any knee surgery-she is also on Eliquis , and apparently has declined any further surgeries on her knee in the past.  GI will follow with you  Hold Fosamax       Amy Esterwood PA-C 08/21/2024, 5:09 PM   Attending physician's note  I personally saw the patient and performed a substantive portion of the medical decision making process for this encounter (including a complete performance of the key components : MDM, Hx and Exam), in conjunction with the APP.  I agree with the APP's note, impression, and  the management plan for the number and complexity of problems addressed at the encounter for the patient and take responsibility for that plan with its inherent risk of complications, morbidity, or mortality with additional input as follows.    78 year old female with CKD, history of PE, CVA on chronic Eliquis  recently discharged from hospital on  Augmentin  and valacyclovir  after treated for left ear pain, ?  Facial droop on the left concerning for Bell's palsy, she was discharged on Augmentin  and valacyclovir   She presented to ER with several episodes of dark blood emesis and hematemesis  Denies any abdominal pain  On exam abdomen soft no tenderness or distention  Hemoglobin stable, no hemodynamic instability Last dose of Eliquis  was earlier this morning.  Patient is also having pain in her right knee, she had multiple surgeries to the knee  Continue to monitor hemoglobin and transfuse if below 7 PPI IV twice daily Plan for urgent EGD tomorrow a.m. if has significant decline in hemoglobin if not we will wait for Eliquis  washout and tentatively plan EGD on Tuesday N.p.o. after midnight  Erosive esophagitis, erosive gastritis, gastroduodenal ulcer, Mallory-Weiss tear in the differential Avoid NSAIDs Hold Eliquis   GI will continue to follow along    High complex medical decision making (this includes chart review, review of results, face-to-face time used for counseling as well as treatment plan and follow-up. The patient was provided an opportunity to ask questions and all were answered. The patient agreed with the plan and demonstrated an understanding of the instructions.  Beth [name] , MD [phone]

## 2024-08-21 NOTE — Assessment & Plan Note (Signed)
 Chronic appreciate orthopedics consult

## 2024-08-21 NOTE — ED Notes (Signed)
 Admitting MD notified reason for delay in blood specimen due to hard stick/arm swelling .

## 2024-08-21 NOTE — ED Notes (Signed)
EDP notified on patient's hypertension .  

## 2024-08-21 NOTE — ED Notes (Signed)
 Phleb stuck, got no blood

## 2024-08-21 NOTE — Assessment & Plan Note (Signed)
Monitor renal function  -chronic avoid nephrotoxic medications such as NSAIDs, Vanco Zosyn combo,  avoid hypotension, continue to follow renal function

## 2024-08-21 NOTE — Progress Notes (Signed)
 Called by ED to review Beth [name]'s telemetry. She is currently in NSR but is intermittently conducting normally with a normal QRS complex and then goes into a left bundle branch block while remaining in normal sinus rhythm. No significant fluctuations in heart rate. Sgarbossa criteria is not met.   Troponin is pending, however given her CKD, hematemesis, and anemia would expect some degree of myocardial  injury.   Beth Blood, MD MS  Cardiology [facility]

## 2024-08-21 NOTE — Assessment & Plan Note (Signed)
 Allow permissive hypertension

## 2024-08-21 NOTE — Assessment & Plan Note (Signed)
 Eliquis  on hold, given GI bleed Discussed case with Dr. Johann  with IR patient may need iV filter in AM will keep NPO for now

## 2024-08-21 NOTE — Assessment & Plan Note (Signed)
 -  Order Sensitive  SSI     -  check TSH and HgA1C  - Hold by mouth medications*

## 2024-08-21 NOTE — Assessment & Plan Note (Signed)
 Ultrasound showed no evidence of DVT but suspicious for cystic lesion CT scan showed Multiloculated complex high density elbow effusion that may represent hemarthrosis versus septic joint.   For tonight cover with cefepime  and vancomycin  Orthopedics already been called for right knee infection which is actually chronic patient stated that she did not want anybody to operate on her right knee anymore Now the left elbow issue is new patient does have history of gout but this is unclear if this is related or not. Obtain uric acid Orthopedics consult

## 2024-08-21 NOTE — ED Notes (Signed)
 RN attempted IV access x 1, second RN attempting with US . Patient left arm is compromised due to swelling at L elbow, provider aware.

## 2024-08-21 NOTE — ED Notes (Addendum)
 Unable to start 2nd peripheral IV despite multiple attempts due to minute veins/swelling .

## 2024-08-21 NOTE — ED Notes (Signed)
 Dr. Cephas advised RN to start IV antibiotics even if we have not collected blood cultured due to hard stick .

## 2024-08-21 NOTE — Assessment & Plan Note (Signed)
 Continue valacyclovir  Currently on cefepime  Vanco for cellulitis or possibly abscess of left lower extremity

## 2024-08-21 NOTE — Progress Notes (Signed)
 Pharmacy Antibiotic Note  Beth [name] is a 78 y.o. female admitted on 08/21/2024 presenting with concern for sepsis.  Pharmacy has been consulted for vancomycin  and cefepime  dosing.  Plan: Vancomycin  1500 mg IV x 1, then 1g q 48h (eAUC  500) Cefepime  2g IV q 24h Monitor renal function, Cx and clinical progression to narrow Vancomycin  levels as indicated     Temp (24hrs), Avg:98.8 F (37.1 C), Min:98.1 F (36.7 C), Max:99.7 F (37.6 C)  Recent Labs  Lab 08/16/24 0539 08/17/24 0403 08/18/24 0234 08/19/24 0320 08/21/24 1438  WBC 5.6 5.9 6.1 5.8 8.1  CREATININE 1.95* 1.93* 2.01* 2.13* 2.13*    Estimated Creatinine Clearance: 24.6 mL/min (A) (by C-G formula based on SCr of 2.13 mg/dL (H)).    Allergies  Allergen Reactions   Cefdinir Swelling and Rash    Tolerated cephalosporins many times in the past   Aleve [Naproxen Sodium] Other (See Comments)    Heart races   Dorethia Gibson ] Other (See Comments)    Nose bleeding   Ciprofloxacin  Other (See Comments)    Possible hamstring tendinopathy   Clonidine  Derivatives Other (See Comments)    Dizziness and weakness   Shellfish Allergy Nausea And Vomiting    Dorn Poot, PharmD, [facility] Clinical Pharmacist ED Pharmacist Phone # [phone] 08/21/2024 9:35 PM

## 2024-08-22 ENCOUNTER — Inpatient Hospital Stay (HOSPITAL_COMMUNITY)

## 2024-08-22 ENCOUNTER — Inpatient Hospital Stay (HOSPITAL_COMMUNITY): Admitting: Anesthesiology

## 2024-08-22 ENCOUNTER — Encounter (HOSPITAL_COMMUNITY): Admission: EM | Disposition: E | Payer: Self-pay | Source: Skilled Nursing Facility | Attending: Internal Medicine

## 2024-08-22 ENCOUNTER — Encounter (HOSPITAL_COMMUNITY): Payer: Self-pay | Admitting: Internal Medicine

## 2024-08-22 ENCOUNTER — Ambulatory Visit (INDEPENDENT_AMBULATORY_CARE_PROVIDER_SITE_OTHER): Admitting: Audiology

## 2024-08-22 DIAGNOSIS — K2950 Unspecified chronic gastritis without bleeding: Secondary | ICD-10-CM | POA: Diagnosis not present

## 2024-08-22 DIAGNOSIS — I130 Hypertensive heart and chronic kidney disease with heart failure and stage 1 through stage 4 chronic kidney disease, or unspecified chronic kidney disease: Secondary | ICD-10-CM

## 2024-08-22 DIAGNOSIS — K920 Hematemesis: Secondary | ICD-10-CM

## 2024-08-22 DIAGNOSIS — K449 Diaphragmatic hernia without obstruction or gangrene: Secondary | ICD-10-CM | POA: Diagnosis not present

## 2024-08-22 DIAGNOSIS — N184 Chronic kidney disease, stage 4 (severe): Secondary | ICD-10-CM

## 2024-08-22 DIAGNOSIS — M00822 Arthritis due to other bacteria, left elbow: Secondary | ICD-10-CM | POA: Diagnosis not present

## 2024-08-22 DIAGNOSIS — Z87898 Personal history of other specified conditions: Secondary | ICD-10-CM

## 2024-08-22 DIAGNOSIS — Z7901 Long term (current) use of anticoagulants: Secondary | ICD-10-CM

## 2024-08-22 DIAGNOSIS — T182XXA Foreign body in stomach, initial encounter: Secondary | ICD-10-CM | POA: Diagnosis not present

## 2024-08-22 DIAGNOSIS — Z86711 Personal history of pulmonary embolism: Secondary | ICD-10-CM | POA: Diagnosis not present

## 2024-08-22 DIAGNOSIS — I5030 Unspecified diastolic (congestive) heart failure: Secondary | ICD-10-CM | POA: Diagnosis not present

## 2024-08-22 HISTORY — PX: ESOPHAGOGASTRODUODENOSCOPY: SHX5428

## 2024-08-22 LAB — COMPREHENSIVE METABOLIC PANEL WITH GFR
ALT: 14 U/L (ref 0–44)
AST: 17 U/L (ref 15–41)
Albumin: 2 g/dL — ABNORMAL LOW (ref 3.5–5.0)
Alkaline Phosphatase: 80 U/L (ref 38–126)
Anion gap: 9 (ref 5–15)
BUN: 31 mg/dL — ABNORMAL HIGH (ref 8–23)
CO2: 19 mmol/L — ABNORMAL LOW (ref 22–32)
Calcium: 8.3 mg/dL — ABNORMAL LOW (ref 8.9–10.3)
Chloride: 108 mmol/L (ref 98–111)
Creatinine, Ser: 2.04 mg/dL — ABNORMAL HIGH (ref 0.44–1.00)
GFR, Estimated: 24 mL/min — ABNORMAL LOW (ref >60)
Glucose, Bld: 149 mg/dL — ABNORMAL HIGH (ref 70–99)
Potassium: 4.3 mmol/L (ref 3.5–5.1)
Sodium: 136 mmol/L (ref 135–145)
Total Bilirubin: 0.9 mg/dL (ref 0.0–1.2)
Total Protein: 6.6 g/dL (ref 6.5–8.1)

## 2024-08-22 LAB — CBC
HCT: 30.5 % — ABNORMAL LOW (ref 36.0–46.0)
HCT: 30.9 % — ABNORMAL LOW (ref 36.0–46.0)
HCT: 30.4 % — ABNORMAL LOW (ref 36.0–46.0)
Hemoglobin: 9.3 g/dL — ABNORMAL LOW (ref 12.0–15.0)
Hemoglobin: 9.2 g/dL — ABNORMAL LOW (ref 12.0–15.0)
Hemoglobin: 9.2 g/dL — ABNORMAL LOW (ref 12.0–15.0)
MCH: 26.5 pg (ref 26.0–34.0)
MCH: 26.4 pg (ref 26.0–34.0)
MCH: 26.3 pg (ref 26.0–34.0)
MCHC: 30.5 g/dL (ref 30.0–36.0)
MCHC: 29.8 g/dL — ABNORMAL LOW (ref 30.0–36.0)
MCHC: 30.3 g/dL (ref 30.0–36.0)
MCV: 86.9 fL (ref 80.0–100.0)
MCV: 88.5 fL (ref 80.0–100.0)
MCV: 86.9 fL (ref 80.0–100.0)
Platelets: 356 K/uL (ref 150–400)
Platelets: 378 K/uL (ref 150–400)
Platelets: 369 K/uL (ref 150–400)
RBC: 3.51 MIL/uL — ABNORMAL LOW (ref 3.87–5.11)
RBC: 3.49 MIL/uL — ABNORMAL LOW (ref 3.87–5.11)
RBC: 3.5 MIL/uL — ABNORMAL LOW (ref 3.87–5.11)
RDW: 16.2 % — ABNORMAL HIGH (ref 11.5–15.5)
RDW: 16.4 % — ABNORMAL HIGH (ref 11.5–15.5)
RDW: 16.5 % — ABNORMAL HIGH (ref 11.5–15.5)
WBC: 7 K/uL (ref 4.0–10.5)
WBC: 7.7 K/uL (ref 4.0–10.5)
WBC: 7.1 K/uL (ref 4.0–10.5)
nRBC: 0 % (ref 0.0–0.2)
nRBC: 0 % (ref 0.0–0.2)
nRBC: 0 % (ref 0.0–0.2)

## 2024-08-22 LAB — URINALYSIS, ROUTINE W REFLEX MICROSCOPIC
Bilirubin Urine: NEGATIVE
Glucose, UA: >=500 mg/dL — AB
Hgb urine dipstick: NEGATIVE
Ketones, ur: NEGATIVE mg/dL
Leukocytes,Ua: NEGATIVE
Nitrite: NEGATIVE
Protein, ur: 100 mg/dL — AB
Specific Gravity, Urine: 1.013 (ref 1.005–1.030)
pH: 5 (ref 5.0–8.0)

## 2024-08-22 LAB — GLUCOSE, CAPILLARY
Glucose-Capillary: 149 mg/dL — ABNORMAL HIGH (ref 70–99)
Glucose-Capillary: 154 mg/dL — ABNORMAL HIGH (ref 70–99)

## 2024-08-22 LAB — RETICULOCYTES
Immature Retic Fract: 20.1 % — ABNORMAL HIGH (ref 2.3–15.9)
RBC.: 3.51 MIL/uL — ABNORMAL LOW (ref 3.87–5.11)
Retic Count, Absolute: 44.9 K/uL (ref 19.0–186.0)
Retic Ct Pct: 1.3 % (ref 0.4–3.1)

## 2024-08-22 LAB — URIC ACID: Uric Acid, Serum: 8.1 mg/dL — ABNORMAL HIGH (ref 2.5–7.1)

## 2024-08-22 LAB — VITAMIN B12: Vitamin B-12: 445 pg/mL (ref 180–914)

## 2024-08-22 LAB — MAGNESIUM
Magnesium: 1.9 mg/dL (ref 1.7–2.4)
Magnesium: 2 mg/dL (ref 1.7–2.4)

## 2024-08-22 LAB — HEMOGLOBIN A1C
Hgb A1c MFr Bld: 6.4 % — ABNORMAL HIGH (ref 4.8–5.6)
Mean Plasma Glucose: 136.98 mg/dL

## 2024-08-22 LAB — IRON AND TIBC
Iron: 25 ug/dL — ABNORMAL LOW (ref 28–170)
Saturation Ratios: 20 % (ref 10.4–31.8)
TIBC: 125 ug/dL — ABNORMAL LOW (ref 250–450)
UIBC: 100 ug/dL

## 2024-08-22 LAB — CBG MONITORING, ED
Glucose-Capillary: 111 mg/dL — ABNORMAL HIGH (ref 70–99)
Glucose-Capillary: 114 mg/dL — ABNORMAL HIGH (ref 70–99)
Glucose-Capillary: 116 mg/dL — ABNORMAL HIGH (ref 70–99)
Glucose-Capillary: 137 mg/dL — ABNORMAL HIGH (ref 70–99)

## 2024-08-22 LAB — TROPONIN I (HIGH SENSITIVITY)
Troponin I (High Sensitivity): 2377 ng/L (ref <18)
Troponin I (High Sensitivity): 2738 ng/L (ref <18)

## 2024-08-22 LAB — PHOSPHORUS: Phosphorus: 4.3 mg/dL (ref 2.5–4.6)

## 2024-08-22 LAB — FOLATE: Folate: >20 ng/mL (ref >5.9)

## 2024-08-22 LAB — FERRITIN: Ferritin: 321 ng/mL — ABNORMAL HIGH (ref 11–307)

## 2024-08-22 SURGERY — EGD (ESOPHAGOGASTRODUODENOSCOPY)
Anesthesia: Monitor Anesthesia Care

## 2024-08-22 MED ORDER — PANTOPRAZOLE SODIUM 40 MG PO TBEC
40.0000 mg | DELAYED_RELEASE_TABLET | Freq: Every day | ORAL | Status: DC
  Administered 2024-08-23 – 2024-09-02 (×11): 40 mg via ORAL
  Filled 2024-08-22 (×12): qty 1

## 2024-08-22 MED ORDER — IPRATROPIUM-ALBUTEROL 0.5-2.5 (3) MG/3ML IN SOLN: 3.0000 mL | RESPIRATORY_TRACT | Status: DC | PRN

## 2024-08-22 MED ORDER — LABETALOL HCL 5 MG/ML IV SOLN
5.0000 mg | INTRAVENOUS | Status: DC | PRN
  Administered 2024-08-22: 5 mg via INTRAVENOUS
  Filled 2024-08-22: qty 4

## 2024-08-22 MED ORDER — OXYCODONE HCL 5 MG PO TABS: 5.0000 mg | ORAL_TABLET | Freq: Once | ORAL | Status: DC | PRN

## 2024-08-22 MED ORDER — FENTANYL CITRATE (PF) 100 MCG/2ML IJ SOLN: 25.0000 ug | INTRAMUSCULAR | Status: DC | PRN

## 2024-08-22 MED ORDER — ALLOPURINOL 100 MG PO TABS: 50.0000 mg | ORAL_TABLET | Freq: Every day | ORAL | Status: DC

## 2024-08-22 MED ORDER — PROPOFOL 1000 MG/100ML IV EMUL
INTRAVENOUS | Status: AC
  Filled 2024-08-22: qty 300

## 2024-08-22 MED ORDER — FAMOTIDINE 20 MG PO TABS
20.0000 mg | ORAL_TABLET | Freq: Every day | ORAL | Status: DC
  Administered 2024-08-22 – 2024-08-29 (×8): 20 mg via ORAL
  Filled 2024-08-22 (×9): qty 1

## 2024-08-22 MED ORDER — PHENYLEPHRINE 80 MCG/ML (10ML) SYRINGE FOR IV PUSH (FOR BLOOD PRESSURE SUPPORT)
PREFILLED_SYRINGE | INTRAVENOUS | Status: DC | PRN
  Administered 2024-08-22: 120 ug via INTRAVENOUS
  Administered 2024-08-22: 160 ug via INTRAVENOUS

## 2024-08-22 MED ORDER — APIXABAN 5 MG PO TABS
5.0000 mg | ORAL_TABLET | Freq: Two times a day (BID) | ORAL | Status: DC
  Administered 2024-08-22 – 2024-09-01 (×21): 5 mg via ORAL
  Filled 2024-08-22 (×22): qty 1

## 2024-08-22 MED ORDER — SODIUM CHLORIDE 0.9 % IV SOLN: INTRAVENOUS | Status: DC | PRN

## 2024-08-22 MED ORDER — PROPOFOL 500 MG/50ML IV EMUL
INTRAVENOUS | Status: DC | PRN
  Administered 2024-08-22: 30 mg via INTRAVENOUS
  Administered 2024-08-22: 175 ug/kg/min via INTRAVENOUS
  Administered 2024-08-22: 30 mg via INTRAVENOUS

## 2024-08-22 MED ORDER — APIXABAN 5 MG PO TABS: 5.0000 mg | ORAL_TABLET | Freq: Two times a day (BID) | ORAL | Status: DC

## 2024-08-22 MED ORDER — OXYCODONE HCL 5 MG/5ML PO SOLN: 5.0000 mg | Freq: Once | ORAL | Status: DC | PRN

## 2024-08-22 NOTE — ED Notes (Signed)
 Pt soiled, cleaned and changed into new brief. Urine collected by in and out cath.

## 2024-08-22 NOTE — ED Notes (Signed)
 Pt transported to EGD.  Will return to ED. MRI postponed until after EGD.

## 2024-08-22 NOTE — Progress Notes (Signed)
 Orthopedic Tech Progress Note Patient Details:  Beth [name] [birth year] [id number]  Ortho Devices Type of Ortho Device: Ace wrap, Arm sling, Cotton web roll, Long arm splint Ortho Device/Splint Location: LUE Ortho Device/Splint Interventions: Ordered, Application, Adjustment   Post Interventions Patient Tolerated: Well Instructions Provided: Care of device  [name] Pac 08/22/2024, 2:14 PM

## 2024-08-22 NOTE — Plan of Care (Addendum)
 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  Currently not having any active GI bleed and CBC from last night hemoglobin 10.2 pending morning CBC.  Gastroenterology already evaluated patient yesterday evening and plan for EGD today morning.  Currently on IV Protonix  and octreotide  infusion.  Received a page from patient RN that troponin is 2377.  Per chart review patient baseline troponin around 2000-2300.SABRA  History of CKD stage V.  Chronically elevated troponin in the setting of poor renal clearance from advanced CKD patient is not being dialysis yet as well as this time troponin is elevated secondary to demand ischemia.  Patient does not have any chest pain or shortness of breath. EKG obtained during admission which showed normal sinus rhythm heart rate 82.  There is no ST or T wave abnormality.  Patient has been scheduled for EGD today morning.  Eliquis  currently on hold. After EGD procedure need to decide with gastroenterology regarding timing for reinitiation of oral Eliquis .  Charleston Vierling, MD [facility] 08/22/2024, 6:03 AM

## 2024-08-22 NOTE — ED Notes (Signed)
I tried to get patient blood I had no success. The Nurse was informed.

## 2024-08-22 NOTE — ED Notes (Signed)
Admitting MD paged by secretary for RN to update on elevated Troponin result.

## 2024-08-22 NOTE — Anesthesia Preprocedure Evaluation (Addendum)
 Anesthesia Evaluation  Patient identified by MRN, date of birth, ID band Patient awake  General Assessment Comment:  Last vomiting was yesterday. Denies any nausea or vomiting today.  Reviewed: Allergy & Precautions, H&P , NPO status , Patient's Chart, lab work & pertinent test results, reviewed documented beta blocker date and time   History of Anesthesia Complications (+) Family history of anesthesia reactionNegative for: history of anesthetic complications  Airway Mallampati: II  TM Distance: >3 FB Neck ROM: full    Dental no notable dental hx. (+) Poor Dentition,    Pulmonary neg pulmonary ROS, neg sleep apnea, neg COPD, Patient abstained from smoking.Not current smoker   breath sounds clear to auscultation       Cardiovascular Exercise Tolerance: Good METShypertension, Pt. on medications + CAD and +CHF  (-) Past MI (-) dysrhythmias + Valvular Problems/Murmurs  Rhythm:regular Rate:Normal  Recent NSTEMI in the setting of severe hypertension. Angiography deferred per cardiology in favor of medical management.  Troponins still elevated. Asymptomatic currently.  1. Left ventricular ejection fraction, by estimation, is 60 to 65%. The  left ventricle has normal function. The left ventricle has no regional  wall motion abnormalities. Left ventricular diastolic parameters are  consistent with Grade I diastolic  dysfunction (impaired relaxation).   2. Right ventricular systolic function is normal. The right ventricular  size is normal.   3. The mitral valve is normal in structure. No evidence of mitral valve  regurgitation. No evidence of mitral stenosis.   4. The aortic valve is tricuspid. Aortic valve regurgitation is not  visualized. Aortic valve sclerosis/calcification is present, without any  evidence of aortic stenosis. Aortic valve Vmax measures 1.25 m/s.   5. The inferior vena cava is normal in size with greater than 50%   respiratory variability, suggesting right atrial pressure of 3 mmHg.      Neuro/Psych  Neuromuscular disease CVA, No Residual Symptoms  negative psych ROS   GI/Hepatic ,neg GERD  ,,(+)     (-) substance abuse    Endo/Other  diabetes, Type 2    Renal/GU Renal InsufficiencyRenal disease     Musculoskeletal  (+) Arthritis ,    Abdominal   Peds  Hematology  (+) Blood dyscrasia, anemia   Anesthesia Other Findings Past Medical History: No date: Anemia No date: Arthritis     Comment:  Knee both knees 2012: Blood transfusion without reported diagnosis     Comment:  anemia;pt denies transfusion stated was only on iron                tablet No date: Cataract     Comment:  left No date: CKD (chronic kidney disease), stage III (HCC) No date: Diabetes mellitus without complication (HCC) No date: Family history of anesthesia complication     Comment:  sister very slow to awaken after anesthesia;severe               vomiting  No date: Gout     Comment:  left elbow 08/09/2014: Herpes infection     Comment:  Saw doctor Wed. 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) 07/23/2020: Osteomyelitis of right tibia (HCC) 07/23/2020: Other acute osteomyelitis, right femur (HCC) 12/15/2018: Pseudomonas aeruginosa infection 12/15/2018: Pseudomonas aeruginosa infection 08/27/2020: Septic arthritis of knee, right (HCC) 2006: Stroke (HCC)     Comment:  x 1 no deficits noted   Reproductive/Obstetrics                              Anesthesia Physical Anesthesia Plan  ASA: 4  Anesthesia Plan: MAC   Post-op Pain Management: Minimal or no pain anticipated   Induction: Intravenous  PONV Risk Score and Plan: 2 and Propofol  infusion and Treatment may vary due to age or  medical condition  Airway Management Planned: Nasal Cannula  Additional Equipment: None  Intra-op Plan:   Post-operative Plan: Extubation in OR  Informed Consent: I have reviewed the patients History and Physical, chart, labs and discussed the procedure including the risks, benefits and alternatives for the proposed anesthesia with the patient or authorized representative who has indicated his/her understanding and acceptance.     Dental advisory given  Plan Discussed with: CRNA, Anesthesiologist and Surgeon  Anesthesia Plan Comments: (Discussed risks of anesthesia with patient, including possibility of difficulty with spontaneous ventilation under anesthesia necessitating airway intervention, aspiration requiring airway intervention, PONV, and rare risks such as cardiac or respiratory or neurological events, and allergic reactions. Discussed the role of CRNA in patient's perioperative care. Patient understands.)         Anesthesia Quick Evaluation

## 2024-08-22 NOTE — ED Notes (Signed)
 Unable to collect blood specimens despite multiple attempts by phlebotomist/RN .

## 2024-08-22 NOTE — ED Notes (Signed)
 Dr. Lee notified on patient's elevated Troponin result .

## 2024-08-22 NOTE — Consult Note (Signed)
 History: Beth [name] is a very pleasant 78 year old woman who presents now for the third time with hemoptysis and right knee pain.  Orthopedic consultation was requested for evaluation of chronic right knee osteo as well as questionable left elbow infection versus gout.  Patient has been followed by Dr. Hiram at [facility] and so I was consulted to evaluate the patient.  Past Medical History:  Diagnosis Date   Anemia    Arthritis    Knee both knees   Blood transfusion without reported diagnosis 2012   anemia;pt denies transfusion stated was only on iron  tablet   Cataract    left   CKD (chronic kidney disease), stage III (HCC)    Diabetes mellitus without complication (HCC)    Family history of anesthesia complication    sister very slow to awaken after anesthesia;severe vomiting    Gout    left elbow   Herpes infection 08/09/2014   Saw doctor Wed. 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   Other acute osteomyelitis, right femur (HCC) 07/23/2020   Pseudomonas aeruginosa infection 12/15/2018   Pseudomonas aeruginosa infection 12/15/2018   Septic arthritis of knee, right (HCC) 08/27/2020   Stroke (HCC) 2006   x 1 no deficits noted     Allergies  Allergen Reactions   Cefdinir Swelling and Rash    Tolerated cephalosporins many times in the past   Aleve [Naproxen Sodium] Other (See Comments)    Heart races   Beth [name] ] Other (See Comments)    Nose bleeding   Ciprofloxacin  Other (See Comments)    Possible hamstring tendinopathy   Clonidine  Derivatives Other (See Comments)    Dizziness and weakness   Shellfish Allergy Nausea And Vomiting    No current facility-administered medications on file prior to encounter.   Current Outpatient Medications on  File Prior to Encounter  Medication Sig Dispense Refill   alendronate  (FOSAMAX ) 70 MG tablet TAKE 1 TABLET WEEKLY (TAKE WITH 8OZ OF WATER  30 MINUTES BEFORE BREAKFAST) (Patient taking differently: Take 70 mg by mouth every Tuesday.) 12 tablet 1   amoxicillin -clavulanate (AUGMENTIN ) 875-125 MG tablet Take 1 tablet by mouth 2 (two) times daily.     apixaban  (ELIQUIS ) 5 MG TABS tablet Take 2 tablets (10 mg total) by mouth 2 (two) times daily for 7 days, THEN 1 tablet (5 mg total) 2 (two) times daily. (Patient taking differently: Take 1 tablet (5mg ) by mouth twice daily.) 60 tablet 2   aspirin  EC 81 MG tablet Take 1 tablet (81 mg total) by mouth daily. Swallow whole. 30 tablet 1   atorvastatin  (LIPITOR ) 40 MG tablet Take 40 mg by mouth at bedtime.     azelastine  (ASTELIN ) 0.1 % nasal spray Place 1 spray into both nostrils 2 (two) times daily. Use in each nostril as directed 30 mL 12   ciprofloxacin -dexamethasone  (CIPRODEX ) OTIC suspension Place 4 drops into the left ear 2 (two) times daily. 7.5 mL 0   colchicine  0.6 MG tablet Take twice daily for gout attack. (may take every two hours up to 6 doses at acute onset) (Patient taking differently: Take 0.6 mg by mouth 2 (two) times daily.) 60 tablet 2   diltiazem  (CARDIZEM  CD) 240 MG 24 hr capsule TAKE ONE CAPSULE  BY MOUTH DAILY 90 capsule 1   empagliflozin  (JARDIANCE ) 10 MG TABS tablet Take 1 tablet (10 mg total) by mouth daily. 90 tablet 3   ferrous sulfate  325 (65 FE) MG tablet Take 325 mg by mouth 2 (two) times a week. Take one tablet by mouth on Tue & Thurs.     hydrALAZINE  (APRESOLINE ) 25 MG tablet TAKE ONE (1) TABLET BY MOUTH 3 TIMES DAILY 90 tablet 0   HYDROcodone -acetaminophen  (NORCO/VICODIN) 5-325 MG tablet Take 1 tablet by mouth every 6 (six) hours as needed for moderate pain (pain score 4-6). 5 tablet 0   hydrocortisone  cream 1 % Apply topically 2 (two) times daily. To left ear 30 g 0   isosorbide  mononitrate (IMDUR ) 60 MG 24 hr tablet Take 1  tablet (60 mg total) by mouth daily. 30 tablet 1   ondansetron  (ZOFRAN -ODT) 4 MG disintegrating tablet Take 4 mg by mouth every 8 (eight) hours as needed for vomiting or nausea.     valACYclovir  (VALTREX ) 500 MG tablet Take 1 tablet (500 mg total) by mouth 2 (two) times daily. (Patient not taking: Reported on 08/21/2024)      Physical Exam: Vitals:   08/22/24 0545 08/22/24 0615  BP: (!) 168/72 (!) 165/65  Pulse: 84 86  Resp: (!) 25 (!) 25  Temp:    SpO2: 96% 95%   Patient is currently in bed with multiple complaints.  She is alert and oriented x 3.   No shortness of breath or chest pain. Abdomen is soft and nontender.  No rebound tenderness. Upper extremity: Generalized pain and malaise with range of motion of the shoulder elbow and wrist bilaterally.  Increased pain with palpation of the left elbow and positive erythema. No abrasion or contusion seen.   Lower extremity: Patient has pain with range of motion of the hip, knee, ankle bilaterally.  Well-healed surgical scar right knee status post total knee replacement.  Positive venous congestion/stasis skin changes. EHL/tibialis anterior/gastrocnemius is intact bilaterally.  Sensation light touch is intact bilaterally.  Negative Babinski test. Patient is unable to stand or ambulate.   Image: CT ELBOW LEFT WO CONTRAST Result Date: 08/21/2024 CLINICAL DATA:  Soft tissue infection suspected, elbow, no prior imaging us  showing cystic mass, exam showing redness and swelling on the medial aspect above the elbow EXAM: CT OF THE UPPER LEFT EXTREMITY WITHOUT CONTRAST TECHNIQUE: Multidetector CT imaging of the upper left extremity was performed according to the standard protocol. RADIATION DOSE REDUCTION: This exam was performed according to the departmental dose-optimization program which includes automated exposure control, adjustment of the mA and/or kV according to patient size and/or use of iterative reconstruction technique. COMPARISON:  None  Available. FINDINGS: Bones/Joint/Cartilage No cortical erosion or destruction. No evidence of fracture or dislocation. Multiloculated complex high-density elbow joint effusion that may represent blood products versus infection. Severe degenerative changes of the elbow. Ligaments Suboptimally assessed by CT. Muscles and Tendons Grossly unremarkable. Soft tissues Dorsal elbow subcutaneus soft tissue edema. No subcutaneus soft tissue emphysema. No organized fluid collection. IMPRESSION: 1. Multiloculated complex high density elbow effusion that may represent hemarthrosis versus septic joint. 2. Severe degenerative changes of the elbow. 3.  No acute displaced fracture or dislocation. 4.  No radiographic findings to suggest osteomyelitis. Electronically Signed   By: Morgane  Naveau M.D.   On: 08/21/2024 21:45   CT Knee Right Wo Contrast Result Date: 08/21/2024 CLINICAL DATA:  Hardware failure, right knee EXAM: CT OF THE RIGHT KNEE WITHOUT CONTRAST TECHNIQUE: Multidetector CT imaging  of the right knee was performed according to the standard protocol. Multiplanar CT image reconstructions were also generated. RADIATION DOSE REDUCTION: This exam was performed according to the departmental dose-optimization program which includes automated exposure control, adjustment of the mA and/or kV according to patient size and/or use of iterative reconstruction technique. 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 Two threaded rods are present, 1 of which extends from the pre tibial subcutaneous tissues below the tibial tubercle and into the tibial component of the methacrylate proximally to the level of a transverse lucent plane through the methacrylate as shown on image 71 series 7. Second  rod extends from the native bone proximal tibia through the tibial methacrylate but the rod is fractured at the level of a lucent plane between the tibial and the femoral component of the methacrylate with 8 mm of displacement as shown on image 67 series 7. The distal fragment continues through the femoral cavity component of the spacer in which it terminates near the proximal tip of the spacer. The femoral component of the spacer is separated from the native bone by lucency of variable thickness, between 2 and 6 mm. The tibial component is closely applied to the anterior tibia but has up to 6 mm of lucency around its posteromedial margin. With the fracture in the rod along the plane between the femoral and tibial component of the spacer, there is resulting pseudoarticulation with a non rigid attachment as shown on image 67 series 7. The patellar is flattened with associated volume loss. Capsular calcifications anteriorly in the knee joint and scattered elsewhere in the knee joint. Ligaments Suboptimally assessed by CT. Muscles and Tendons Severe regional muscular atrophy. Soft tissues Subcutaneous edema anterior to the patella and patellar tendon region. Atheromatous vascular calcifications. IMPRESSION: 1. Antibiotic impregnated spacer in the distal femoral and proximal tibial cavities is partially fragmented, and the traversing threaded rod is fractured resulting in a pseudoarticulation. 2. The femoral component of the spacer is separated from the native bone by lucency of variable thickness, between 2 and 6 mm. 3. The tibial component is closely applied to the anterior tibia but has up to 6 mm of lucency around its posteromedial margin. 4. The patellar is flattened with associated volume loss. 5. Capsular calcifications anteriorly in the knee joint and scattered elsewhere in the knee joint. 6. Severe regional muscular atrophy. 7. Subcutaneous edema anterior to the patella and patellar tendon region.  Electronically Signed   By: Ryan Salvage M.D.   On: 08/21/2024 18:38   UE VENOUS DUPLEX (7am - 7pm) Result Date: 08/21/2024 UPPER VENOUS STUDY  Patient Name:  Beth [name]  Date of Exam:   08/21/2024 Medical Rec #: [id number]     Accession #:    [id number] Date of Birth: 12-May-1946     Patient Gender: F Patient Age:   41 years Exam Location:  [facility] Procedure:      VAS US  UPPER EXTREMITY VENOUS DUPLEX Referring Phys: JAYSON PEREYRA --------------------------------------------------------------------------------  Indications: Pain, and Swelling of left upper extremity Risk Factors: 08/06/2024 PE History of gout. Anticoagulation: Eliquis . Limitations: Poor ultrasound/tissue interface, musculoskeletal features and Edema. Comparison Study: No prior upper extremity venous duplex on file Performing Technologist: Alberta Lis RVS  Examination Guidelines: A complete evaluation includes B-mode imaging, spectral Doppler, color  Doppler, and power Doppler as needed of all accessible portions of each vessel. Bilateral testing is considered an integral part of a complete examination. Limited examinations for reoccurring indications may be performed as noted.  Right Findings: +----------+------------+---------+-----------+----------+-------+ RIGHT     CompressiblePhasicitySpontaneousPropertiesSummary +----------+------------+---------+-----------+----------+-------+ Subclavian               Yes       No                       +----------+------------+---------+-----------+----------+-------+  Left Findings: +----------+------------+---------+-----------+----------+-------+ LEFT      CompressiblePhasicitySpontaneousPropertiesSummary +----------+------------+---------+-----------+----------+-------+ IJV           Full       Yes       No                       +----------+------------+---------+-----------+----------+-------+ Subclavian               Yes       No                        +----------+------------+---------+-----------+----------+-------+ Axillary                 Yes       No                       +----------+------------+---------+-----------+----------+-------+ Brachial                 Yes       No                       +----------+------------+---------+-----------+----------+-------+ Radial        Full                                          +----------+------------+---------+-----------+----------+-------+ Ulnar         Full                                          +----------+------------+---------+-----------+----------+-------+ Cephalic      Full                                          +----------+------------+---------+-----------+----------+-------+ Basilic                  Yes       No                       +----------+------------+---------+-----------+----------+-------+  Summary:  Right: No evidence of thrombosis in the subclavian.  Left: No obvious evidence of deep vein thrombosis in the visualized veins of the upper extremity. No evidence of superficial vein thrombosis in the visualized veins of the upper extremity. Vessels in the upper arm are tortuous. Doppler waveforms are pulsatile throughout. Cystic structure noted lateral arm near [facility] measuring 2.2 X 1.69 cm, etiology unknown. Heterogenous area noted lateral proximal forearm, etiology unknown. Further imaging may be warranted if clinically indicated.Limited visualization secondary to limitations stated above.  *See table(s) above for measurements and observations.     Preliminary  CT Temporal Bones Wo Contrast Result Date: 08/21/2024 EXAM: CT TEMPORAL BONES WITHOUT CONTRAST 08/21/2024 03:26:31 PM TECHNIQUE: CT of the temporal bones was performed without the administration of intravenous contrast. Multiplanar reformatted images are provided for review. Automated exposure control, iterative reconstruction, and/or weight based adjustment of the mA/kV was utilized to  reduce the radiation dose to as low as reasonably achievable. COMPARISON: None available. CLINICAL HISTORY: Left ear pain s/p myringotomy 5 days ago. FINDINGS: RIGHT TEMPORAL BONE: EXTERNAL AUDITORY CANAL: Clear. No bony erosion. Scutum is intact. MIDDLE EAR CAVITY: Clear. Ossicular chain is intact. MASTOID AIR CELLS: Clear. INNER EAR: The cochlea and vestibule are unremarkable. Normal mineralization of the otic capsule. Normal semicircular canals. The vestibular aqueduct is not dilated. INTERNAL AUDITORY CANAL: Unremarkable. Normal bony canal of the facial nerve. LEFT TEMPORAL BONE: EXTERNAL AUDITORY CANAL: Opacified. The tympanic membrane is not excludedly visualized. MIDDLE EAR CAVITY: Opacified. Ossicles are intact. No focal osseous erosions are present. Fluid or soft tissue extend into the epitympanum. MASTOID AIR CELLS: Effusion is present without coalescence. INNER EAR: The cochlea and vestibule are unremarkable. Normal mineralization of the otic capsule. Normal semicircular canals. The vestibular aqueduct is not dilated. INTERNAL AUDITORY CANAL: Unremarkable. Normal bony canal of the facial nerve. VASCULAR: Atherosclerotic calcifications are present in the cavernous carotid arteries bilaterally. No hyperdense vessel is present. BRAIN: Unremarkable. ORBITS: No acute abnormality. SINUSES: Right maxillary sinus is chronically opacified. Mild mucosal thickening is present within the anterior right ethmoid air cells. IMPRESSION: 1. Opacification of the left external auditory canal and middle ear cavity with extension into the epitympanum. Effusion in the left mastoid air cells without coalescence. No osseous erosion. 2. Chronically opacified right maxillary sinus and mild mucosal thickening in the anterior right ethmoid air cells. Electronically signed by: Lonni Necessary MD 08/21/2024 04:19 PM EDT RP Workstation: [id number]   CT CHEST ABDOMEN PELVIS WO CONTRAST Result Date: 08/21/2024 CLINICAL DATA:   Epigastric pain EXAM: CT CHEST, ABDOMEN AND PELVIS WITHOUT CONTRAST TECHNIQUE: Multidetector CT imaging of the chest, abdomen and pelvis was performed following the standard protocol without IV contrast. RADIATION DOSE REDUCTION: This exam was performed according to the departmental dose-optimization program which includes automated exposure control, adjustment of the mA and/or kV according to patient size and/or use of iterative reconstruction technique. COMPARISON:  None Available. FINDINGS: CT CHEST FINDINGS Cardiovascular: Aortic atherosclerosis. Cardiomegaly. Extensive three-vessel coronary artery calcifications and or stents. No pericardial effusion. Mediastinum/Nodes: No enlarged mediastinal, hilar, or axillary lymph nodes. Thyroid  gland, trachea, and esophagus demonstrate no significant findings. Lungs/Pleura: Dependent bibasilar scarring or atelectasis. No pleural effusion or pneumothorax. Musculoskeletal: No chest wall abnormality. No acute osseous findings. CT ABDOMEN PELVIS FINDINGS Hepatobiliary: No solid liver abnormality is seen. Tiny gallstones and/or gravel (series 3, image 66). No gallbladder wall thickening, or biliary dilatation. Pancreas: Unremarkable. No pancreatic ductal dilatation or surrounding inflammatory changes. Spleen: Normal in size without significant abnormality. Adrenals/Urinary Tract: Adrenal glands are unremarkable. Small nonobstructive bilateral renal calculi and or renal vascular calcifications. No ureteral calculi or hydronephrosis. Numerous phleboliths throughout the low pelvis, which are not favored to reflect urinary tract calculi. Bladder is unremarkable. Stomach/Bowel: Stomach is within normal limits. Appendix not clearly visualized. No evidence of bowel wall thickening, distention, or inflammatory changes. Sigmoid diverticulosis. Vascular/Lymphatic: No significant vascular findings are present. No enlarged abdominal or pelvic lymph nodes. Reproductive: No mass or other  abnormality. Other: No abdominal wall hernia or abnormality. No ascites. Musculoskeletal: No acute osseous findings. IMPRESSION: 1. No acute noncontrast CT findings  of the chest, abdomen, or pelvis to explain epigastric pain. 2. Tiny gallstones and/or gravel. No CT evidence of acute cholecystitis. 3. Small nonobstructive bilateral renal calculi and or renal vascular calcifications. No ureteral calculi or hydronephrosis. Numerous phleboliths throughout the pelvis which are not favored to reflect urinary tract calculi. 4. Sigmoid diverticulosis without evidence of acute diverticulitis. 5. Cardiomegaly and coronary artery disease. Aortic Atherosclerosis (ICD10-I70.0). Electronically Signed   By: Marolyn JONETTA Jaksch M.D.   On: 08/21/2024 16:03   DG Knee 2 Views Right Result Date: 08/21/2024 EXAM: 1 or 2 VIEW(S) XRAY OF THE RIGHT KNEE 08/21/2024 12:57:00 PM COMPARISON: MRI from 06/29/2020. CLINICAL HISTORY: Knee pain. Reason for exam: hematemesis, knee pain; Per triage notes: Patient BIB [facility] from [facility] for hematemesis that may have started around 1030. Patient has dried blood on her face and dried blood in an emesis basin. FINDINGS: BONES AND JOINTS: Postsurgical changes of distal femur and proximal tibia status post removal of right knee arthroplasty device and subsequent placement of cement and intramedullary fixation rods. The fixation rod traversing the knee joint is fractured with posterior displacement of the distal fracture component. Lucency along the cement bone interface is noted. Heterotopic calcification anterior to the distal femur. SOFT TISSUES: Severe vascular calcifications. IMPRESSION: 1. Fractured intramedullary fixation rod traversing the knee joint with posterior displacement of the distal fracture component. 2. Lucency along the cement-bone interface, cannot exclude osteomyelitis. 3. Heterotopic calcification anterior to the distal femur. 4. Severe vascular calcifications.  Electronically signed by: Waddell Calk MD 08/21/2024 01:22 PM EDT RP Workstation: [id number]   DG Chest Portable 1 View Result Date: 08/21/2024 EXAM: 1 VIEW XRAY OF THE CHEST 08/21/2024 12:57:00 PM COMPARISON: 08/14/2024 CLINICAL HISTORY: Hematemsis. Reason for exam: hematemesis, knee pain; Per triage notes: Patient BIB [facility] from [facility] for hematemesis that may have started around 1030. Patient has dried blood on her face and dried blood in an emesis basin. FINDINGS: LUNGS AND PLEURA: Retrocardiac opacity is identified obscuring the left hemidiaphragm this may represent atelectasis or airspace disease. No pleural effusion. No pneumothorax. HEART AND MEDIASTINUM: Cardiomegaly. Atherosclerotic calcifications. BONES AND SOFT TISSUES: Multilevel degenerative changes of thoracic spine. No acute osseous abnormality. IMPRESSION: 1. Retrocardiac opacity obscuring the left hemidiaphragm, possibly representing atelectasis or airspace disease. 2. Cardiomegaly and atherosclerotic calcifications. Electronically signed by: Waddell Calk MD 08/21/2024 01:11 PM EDT RP Workstation: [id number]   CT TEMPORAL BONES WO CONTRAST Result Date: 08/17/2024 CLINICAL DATA:  78 year old female with left facial droop, left mastoid effusion on recent brain MRI without and with contrast. EXAM: CT TEMPORAL BONES WITHOUT CONTRAST TECHNIQUE: Axial and coronal plane CT imaging of the petrous temporal bones was performed with thin-collimation image reconstruction. No intravenous contrast was administered. Multiplanar CT image reconstructions were also generated. RADIATION DOSE REDUCTION: This exam was performed according to the departmental dose-optimization program which includes automated exposure control, adjustment of the mA and/or kV according to patient size and/or use of iterative reconstruction technique. COMPARISON:  Brain MRI 08/14/2024. FINDINGS: RIGHT TEMPORAL BONE Right external auditory canal is patent. Right  tympanic membrane appears normal. Right tympanic cavity is clear. Right scutum and ossicles appear intact and aligned. Right mastoid antrum is clear. Right mastoid air cells well aerated. Right otic capsule bone mineralization appears normal. Right internal auditory canal, cochlea, vestibule, vestibular aqueduct, semicircular canals, and course of the right 7th nerve appear normal. LEFT TEMPORAL BONE Subtotal opacification of the left external auditory canal (series 10, image 115 and series 6,  image 60. Subtotal opacification of the right tympanic cavity. Left tympanic membrane obscured. Left mastoid antrum opacified. Mostly opacified left mastoid air cells, including air cells with fluid levels (series 6, image 40). No bony EAC erosion identified. Left scutum and ossicles appear to remain intact and aligned. No mastoid coalescence identified. Left IAC, cochlea, vestibule, vestibular aqueduct, and left semicircular canals appear normal. Left stylomastoid foramen appears normal by CT. Vascular: Calcified atherosclerosis at the skull base. Limited intracranial: Grossly stable compared to head CT last month. Visible orbits/paranasal sinuses: Orbits not included. Right maxillary sinus mucoperiosteal thickening again noted. Trace layering fluid in the left sphenoid sinus. Soft tissues: Visible noncontrast deep soft tissue spaces of the face appear negative. IMPRESSION: 1. Subtotal opacification of the left external auditory canal, left tympanic cavity, and left mastoid. No bone erosion is associated. Although nonspecific the constellation favors Infectious combined External Otitis, Otitis Media with reactive mastoid effusion. An EAC tumor (such as squamous cell carcinoma) could not be excluded. 2. Normal right temporal bone. 3. Chronic right maxillary sinusitis. Trace layering paranasal sinus fluid elsewhere. Electronically Signed   By: VEAR Hurst M.D.   On: 08/17/2024 12:36   VAS US  LOWER EXTREMITY VENOUS (DVT) Result  Date: 08/16/2024  Lower Venous DVT Study Patient Name:  Beth [name]  Date of Exam:   08/15/2024 Medical Rec #: [id number]     Accession #:    [id number] Date of Birth: October 17, 1946     Patient Gender: F Patient Age:   83 years Exam Location:  [facility] Procedure:      VAS US  LOWER EXTREMITY VENOUS (DVT) Referring Phys: MAXIMINO SHARPS --------------------------------------------------------------------------------  Indications: Pulmonary embolism.  Limitations: Body habitus and pt unable to tolerate compression. Performing Technologist: Elmarie Lindau, RVT  Examination Guidelines: A complete evaluation includes B-mode imaging, spectral Doppler, color Doppler, and power Doppler as needed of all accessible portions of each vessel. Bilateral testing is considered an integral part of a complete examination. Limited examinations for reoccurring indications may be performed as noted. The reflux portion of the exam is performed with the patient in reverse Trendelenburg.  +---------+---------------+---------+-----------+----------+-------------------+ RIGHT    CompressibilityPhasicitySpontaneityPropertiesThrombus Aging      +---------+---------------+---------+-----------+----------+-------------------+ CFV      Full           Yes      Yes                                      +---------+---------------+---------+-----------+----------+-------------------+ SFJ      Full                                                             +---------+---------------+---------+-----------+----------+-------------------+ FV Prox  Full                                                             +---------+---------------+---------+-----------+----------+-------------------+ FV Mid  full color flow     +---------+---------------+---------+-----------+----------+-------------------+ FV Distal                                             Not well  visualized +---------+---------------+---------+-----------+----------+-------------------+ PFV      Full                                                             +---------+---------------+---------+-----------+----------+-------------------+ POP                                                   Not well visualized +---------+---------------+---------+-----------+----------+-------------------+ PTV      Full                                                             +---------+---------------+---------+-----------+----------+-------------------+ PERO     Full                                                             +---------+---------------+---------+-----------+----------+-------------------+   +---------+---------------+---------+-----------+----------+---------------+ LEFT     CompressibilityPhasicitySpontaneityPropertiesThrombus Aging  +---------+---------------+---------+-----------+----------+---------------+ CFV      Full           Yes      Yes                                  +---------+---------------+---------+-----------+----------+---------------+ SFJ      Full                                                         +---------+---------------+---------+-----------+----------+---------------+ FV Prox  Full                                                         +---------+---------------+---------+-----------+----------+---------------+ FV Mid                                                full color flow +---------+---------------+---------+-----------+----------+---------------+ FV Distal  full color flow +---------+---------------+---------+-----------+----------+---------------+ PFV      Full                                                         +---------+---------------+---------+-----------+----------+---------------+ POP      Full           Yes      Yes                                   +---------+---------------+---------+-----------+----------+---------------+ PTV      Full                                                         +---------+---------------+---------+-----------+----------+---------------+ PERO     Full                                                         +---------+---------------+---------+-----------+----------+---------------+     Summary: RIGHT: - There is no evidence of deep vein thrombosis in the lower extremity. However, portions of this examination were limited- see technologist comments above.  LEFT: - There is no evidence of deep vein thrombosis in the lower extremity.  - No cystic structure found in the popliteal fossa.  *See table(s) above for measurements and observations. Electronically signed by Penne Colorado MD on 08/16/2024 at 3:35:39 PM.    Final    MR Brain W and Wo Contrast Result Date: 08/14/2024 CLINICAL DATA:  L facial droop EXAM: MRI HEAD WITHOUT AND WITH CONTRAST TECHNIQUE: Multiplanar, multiecho pulse sequences of the brain and surrounding structures were obtained without and with intravenous contrast. CONTRAST:  10mL GADAVIST  GADOBUTROL  1 MMOL/ML IV SOLN COMPARISON:  CT head 08/01/2024. FINDINGS: Brain: No acute infarction, acute hemorrhage, hydrocephalus, extra-axial collection or mass lesion. Numerous punctate foci of susceptibility artifact predominantly in the cerebellum, thalami and basal ganglia and to lesser extent scattered throughout the supratentorial brain, compatible with chronic microhemorrhages. Moderate T2/FLAIR hyperintensities the white matter are compatible with chronic microvascular ischemic disease. No abnormal enhancement. Vascular: Normal flow voids. Skull and upper cervical spine: Normal marrow signal. Sinuses/Orbits: Opacified right maxillary sinus. Other: Left mastoid effusion. IMPRESSION: 1. No evidence of acute intracranial abnormality. 2. Moderate chronic microvascular ischemic disease.  3. Numerous chronic microhemorrhages, likely due to chronic hypertension. 4. Left mastoid effusion. 5. Opacified right maxillary sinus. Electronically Signed   By: Gilmore GORMAN Molt M.D.   On: 08/14/2024 22:16   DG Chest Port 1 View Result Date: 08/14/2024 CLINICAL DATA:  Sepsis, cough EXAM: PORTABLE CHEST 1 VIEW COMPARISON:  08/06/2024 FINDINGS: Single frontal view of the chest demonstrates stable enlargement of the cardiac silhouette. No acute airspace disease, effusion, or pneumothorax. No acute bony abnormalities. IMPRESSION: 1. Stable enlarged cardiac silhouette.  No acute airspace disease. Electronically Signed   By: Ozell Daring M.D.   On: 08/14/2024 21:13   NM Pulmonary Perfusion Result Date: 08/06/2024 EXAM: NM Lung Perfusion Scan. CLINICAL  HISTORY: Pulmonary embolism (PE) suspected, high prob. TECHNIQUE: Radiolabeled 4.36 mCi Tc1m MAA (technetium albumin  aggregated injection solution) was administered intravenously via the right forearm. Planar images of the lungs were obtained in multiple projections. RADIOPHARMACEUTICAL: 4.36 millicurie technetium to 1m albumin  aggregated (MAA). COMPARISON: 08/06/2024 chest radiograph. FINDINGS: PERFUSION: Small vaguely wedge-shaped perfusion defect in the anterior right mid lung without radiographic correlate. Otherwise, no perfusion defects. IMPRESSION: 1. Small vaguely wedge-shaped perfusion defect in the anterior right mid lung without radiographic correlate; segmental pulmonary embolism cannot be excluded. Otherwise no perfusion defects. Electronically signed by: Selinda Blue MD 08/06/2024 04:47 PM EDT RP Workstation: [id number]   DG CHEST PORT 1 VIEW Result Date: 08/06/2024 EXAM: 1 VIEW XRAY OF THE CHEST 08/06/2024 03:07:00 PM COMPARISON: 08/01/2024 CLINICAL HISTORY: Abnormal perfusion scan of lung FINDINGS: LUNGS AND PLEURA: No overt pulmonary edema. Low lung volumes. No focal pulmonary opacity. No pleural effusion. No pneumothorax. HEART AND  MEDIASTINUM: Cardiomegaly. Aortic atherosclerosis. BONES AND SOFT TISSUES: No acute osseous abnormality. IMPRESSION: 1. No acute findings. Low lung volumes. 2. Cardiomegaly without overt edema. Electronically signed by: Selinda Blue MD 08/06/2024 03:45 PM EDT RP Workstation: [id number]   CT HEAD WO CONTRAST ( ) Result Date: 08/01/2024 CLINICAL DATA:  Initial evaluation for acute mental status change. EXAM: CT HEAD WITHOUT CONTRAST TECHNIQUE: Contiguous axial images were obtained from the base of the skull through the vertex without intravenous contrast. RADIATION DOSE REDUCTION: This exam was performed according to the departmental dose-optimization program which includes automated exposure control, adjustment of the mA and/or kV according to patient size and/or use of iterative reconstruction technique. COMPARISON:  CT from 12/12/2019 FINDINGS: Brain: Generalized age-related cerebral atrophy. Patchy hypodensity involving the supratentorial cerebral white matter, consistent with chronic small vessel ischemic disease, moderate in nature. Scattered remote lacunar infarcts present about the bilateral basal ganglia, thalami, and pons. No acute intracranial hemorrhage. No acute large vessel territory infarct. No mass lesion, midline shift or mass effect. No hydrocephalus or extra-axial fluid collection. Vascular: No abnormal hyperdense vessel. Calcified atherosclerosis present at skull base. Skull: Scalp soft tissues within normal limits.  Calvarium intact. Sinuses/Orbits: Globes and orbital soft tissues within normal limits. Changes of chronic right maxillary sinusitis noted. Trace left mastoid effusion noted, of doubtful significance. Other: None. IMPRESSION: 1. No acute intracranial abnormality. 2. Generalized age-related cerebral atrophy with moderate chronic small vessel ischemic disease, with multiple remote lacunar infarcts about the bilateral basal ganglia, thalami, and pons. Electronically Signed   By:  Morene Hoard M.D.   On: 08/01/2024 19:03   ECHOCARDIOGRAM COMPLETE Result Date: 08/01/2024    ECHOCARDIOGRAM REPORT   Patient Name:   Beth [name] Date of Exam: 08/01/2024 Medical Rec #:  [id number]    Height:       65.0 in Accession #:    [id number]   Weight:       231.0 lb Date of Birth:  [birth year]    BSA:          2.103 m Patient Age:    78 years     BP:           181/96 mmHg Patient Gender: F            HR:           57 bpm. Exam Location:  Inpatient Procedure: 2D Echo, Cardiac Doppler and Color Doppler (Both Spectral and Color            Flow Doppler were utilized during procedure). Indications:  Elevated Troponin  History:        Patient has prior history of Echocardiogram examinations, most                 recent 03/01/2016. CHF, CKD, stage 4; Risk Factors:Hypertension,                 Diabetes and Dyslipidemia.  Sonographer:    Thea Norlander RCS Referring Phys: MAXIMINO DELENA SHARPS  Sonographer Comments: Technically difficult study due to poor echo windows, suboptimal parasternal window, suboptimal apical window and suboptimal subcostal window. IMPRESSIONS  1. Left ventricular ejection fraction, by estimation, is 60 to 65%. The left ventricle has normal function. The left ventricle has no regional wall motion abnormalities. Left ventricular diastolic parameters are consistent with Grade I diastolic dysfunction (impaired relaxation).  2. Right ventricular systolic function is normal. The right ventricular size is normal.  3. The mitral valve is normal in structure. No evidence of mitral valve regurgitation. No evidence of mitral stenosis.  4. The aortic valve is tricuspid. Aortic valve regurgitation is not visualized. Aortic valve sclerosis/calcification is present, without any evidence of aortic stenosis. Aortic valve Vmax measures 1.25 m/s.  5. The inferior vena cava is normal in size with greater than 50% respiratory variability, suggesting right atrial pressure of 3 mmHg. FINDINGS  Left  Ventricle: Left ventricular ejection fraction, by estimation, is 60 to 65%. The left ventricle has normal function. The left ventricle has no regional wall motion abnormalities. The left ventricular internal cavity size was normal in size. There is  no left ventricular hypertrophy. Left ventricular diastolic parameters are consistent with Grade I diastolic dysfunction (impaired relaxation). Normal left ventricular filling pressure. Right Ventricle: The right ventricular size is normal. No increase in right ventricular wall thickness. Right ventricular systolic function is normal. Left Atrium: Left atrial size was normal in size. Right Atrium: Right atrial size was normal in size. Pericardium: There is no evidence of pericardial effusion. Mitral Valve: The mitral valve is normal in structure. No evidence of mitral valve regurgitation. No evidence of mitral valve stenosis. Tricuspid Valve: The tricuspid valve is normal in structure. Tricuspid valve regurgitation is trivial. No evidence of tricuspid stenosis. Aortic Valve: The aortic valve is tricuspid. Aortic valve regurgitation is not visualized. Aortic valve sclerosis/calcification is present, without any evidence of aortic stenosis. Aortic valve peak gradient measures 6.2 mmHg. Pulmonic Valve: The pulmonic valve was normal in structure. Pulmonic valve regurgitation is not visualized. No evidence of pulmonic stenosis. Aorta: The aortic root is normal in size and structure. Venous: The inferior vena cava is normal in size with greater than 50% respiratory variability, suggesting right atrial pressure of 3 mmHg. IAS/Shunts: No atrial level shunt detected by color flow Doppler.  LEFT VENTRICLE PLAX 2D LVOT diam:     1.90 cm   Diastology LV SV:         50        LV e' medial:    4.35 cm/s LV SV Index:   24        LV E/e' medial:  13.2 LVOT Area:     2.84 cm  LV e' lateral:   6.53 cm/s                          LV E/e' lateral: 8.8  RIGHT VENTRICLE             IVC RV S  prime:     21.90 cm/s  IVC diam: 1.70 cm TAPSE (M-mode): 1.7 cm LEFT ATRIUM           Index        RIGHT ATRIUM           Index LA Vol (A2C): 61.5 ml 29.24 ml/m  RA Area:     12.70 cm                                    RA Volume:   20.10 ml  9.56 ml/m  AORTIC VALVE AV Area (Vmax): 1.77 cm AV Vmax:        125.00 cm/s AV Peak Grad:   6.2 mmHg LVOT Vmax:      78.10 cm/s LVOT Vmean:     49.800 cm/s LVOT VTI:       0.178 m  AORTA Ao Root diam: 2.90 cm Ao Asc diam:  3.50 cm MITRAL VALVE MV Area (PHT): 2.62 cm    SHUNTS MV Decel Time: 289 msec    Systemic VTI:  0.18 m MV E velocity: 57.60 cm/s  Systemic Diam: 1.90 cm MV A velocity: 83.30 cm/s MV E/A ratio:  0.69 Wilbert Bihari MD Electronically signed by Wilbert Bihari MD Signature Date/Time: 08/01/2024/5:27:37 PM    Final    DG CHEST PORT 1 VIEW Result Date: 08/01/2024 CLINICAL DATA:  Shortness of breath EXAM: PORTABLE CHEST 1 VIEW COMPARISON:  03/31/2014 FINDINGS: Low lung volumes accentuate cardiomediastinal silhouette and pulmonary vascularity. No focal consolidation, pleural effusion, or pneumothorax. No displaced rib fractures. IMPRESSION: Hypoventilation.  No acute cardiopulmonary process. Electronically Signed   By: Norman Gatlin M.D.   On: 08/01/2024 14:26    A/P: Makiah is a very pleasant 78 year old woman with multiple medical issues.  Patient was recently admitted with a questionable MI and inner ear infection requiring tubes to be placed and antibiotic therapy.  She was readmitted for a questionable stroke.  She returns now with chronic  pain right knee and more acute left knee pain as well as left elbow pain.  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  She also has a history of gout and it is felt as though the elbow swelling and pain is due to her gout.  She is admitted now with a possible GI bleed with hemoptysis.  According to the patient and her daughter she is essentially a household  ambulator using a walker.  Overall mobility has decreased significantly over the last several months due to these multiple admissions to the hospital.  1.  I have notified Dr. Melodi her original treating orthopedic surgeon.  He will take over care and manage her orthopedic issues.  The patient has indicated that she absolutely does not want any surgical intervention yeah well I indicated that she does not want to cut on anymore.  Given new complaints of left knee pain we will also obtain new imaging of that side. 2.  There is a question of a fluid collection on her CT scan of the elbow so I recommended an MRI to better evaluate for an abscess.  If there is a collection that can be aspirated recommend that interventional radiology aspirate to determine if this is gout, pseudogout, or infection.  In the meantime I recommend a posterior upper extremity splint to aid in pain control. 3.  With respect to antibiotics I will defer this to Dr. Hiram  and the ID team. 4.  Further recommendations pending Dr. Hiram evaluate patient

## 2024-08-22 NOTE — Transfer of Care (Signed)
 Immediate Anesthesia Transfer of Care Note  Patient: Beth [name]  Procedure(s) Performed: EGD (ESOPHAGOGASTRODUODENOSCOPY)  Patient Location: PACU  Anesthesia Type:MAC  Level of Consciousness: drowsy and patient cooperative  Airway & Oxygen Therapy: Patient Spontanous Breathing  Post-op Assessment: Report given to RN  Post vital signs: Reviewed and stable  Last Vitals:  Vitals Value Taken Time  BP 117/56 08/22/24 10:16  Temp    Pulse 85 08/22/24 10:18  Resp 29 08/22/24 10:18  SpO2 96 % 08/22/24 10:18  Vitals shown include unfiled device data.  Last Pain:  Vitals:   08/22/24 0839  TempSrc: Temporal  PainSc: 8          Complications: No notable events documented.

## 2024-08-22 NOTE — Progress Notes (Addendum)
 PHARMACY - ANTICOAGULATION CONSULT NOTE  Pharmacy Consult for Apixaban  Indication: pulmonary embolus  Allergies  Allergen Reactions   Cefdinir Swelling and Rash    Tolerated cephalosporins many times in the past   Aleve [Naproxen Sodium] Other (See Comments)    Heart races   Dorethia Nimrod ] Other (See Comments)    Nose bleeding   Ciprofloxacin  Other (See Comments)    Possible hamstring tendinopathy   Clonidine  Derivatives Other (See Comments)    Dizziness and weakness   Shellfish Allergy Nausea And Vomiting    Patient Measurements: Height: 5' 5 (165.1 cm) Weight: 99.3 kg (218 lb 14.7 oz) IBW/kg (Calculated) : 57 HEPARIN  DW (KG): 79.7  Vital Signs: Temp: 97.9 F (36.6 C) (09/15 1300) Temp Source: Oral (09/15 1300) BP: 170/93 (09/15 1300) Pulse Rate: 83 (09/15 1300)  Labs: Recent Labs    08/21/24 1438 08/22/24 0435 08/22/24 0530  HGB 10.2*  --  9.3*  HCT 32.6*  --  30.5*  PLT 398  --  356  LABPROT 22.3*  --   --   INR 1.9*  --   --   CREATININE 2.13*  --   --   TROPONINIHS  --  2,377*  --     Estimated Creatinine Clearance: 24.6 mL/min (A) (by C-G formula based on SCr of 2.13 mg/dL (H)).   Medical History: Past Medical History:  Diagnosis Date   Anemia    Arthritis    Knee both knees   Blood transfusion without reported diagnosis 2012   anemia;pt denies transfusion stated was only on iron  tablet   Cataract    left   CKD (chronic kidney disease), stage III (HCC)    Diabetes mellitus without complication (HCC)    Family history of anesthesia complication    sister very slow to awaken after anesthesia;severe vomiting    Gout    left elbow   Herpes infection 08/09/2014   Saw doctor Wed. 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 Pt has recent hx of PE. GI consulted and no obvious source of bleeding identified. Will continue PTA Eliquis  regimen.  Hgb 9.3, PLT WNL  Plan:  Resume apixaban  5 mg BID. Clarified start time with Dr. Caleen and will resume tonight at 2200 Continue to monitor signs and symptoms of bleeding and CBC  Prentice DOROTHA Favors, PharmD PGY1 Health-System Pharmacy Administration and Leadership Resident [facility]  08/22/2024 1:26 PM

## 2024-08-22 NOTE — ED Notes (Signed)
 Urinary incontinence care provided , repositioned and adult brief applied .

## 2024-08-22 NOTE — Op Note (Signed)
 [facility] Patient Name: Beth [name] Procedure Date : 08/22/2024 MRN: [id number] Attending MD: Glendia BRAVO. Stacia , MD, [id number] Date of Birth: Apr 30, 1946 CSN: [id number] Age: 78 Admit Type: Outpatient Procedure:                Upper GI endoscopy Indications:              Hematemesis Providers:                Glendia E. Stacia, MD, Hoy Penner, RN, Felice Sar, Technician Referring MD:              Medicines:                Monitored Anesthesia Care Complications:            No immediate complications. Estimated Blood Loss:     Estimated blood loss was minimal. Procedure:                Pre-Anesthesia Assessment:                           - Prior to the procedure, a History and Physical                            was performed, and patient medications and                            allergies were reviewed. The patient's tolerance of                            previous anesthesia was also reviewed. The risks                            and benefits of the procedure and the sedation                            options and risks were discussed with the patient.                            All questions were answered, and informed consent                            was obtained. Prior Anticoagulants: The patient has                            taken Eliquis  (apixaban ), last dose was 1 day prior                            to procedure. ASA Grade Assessment: III - A patient                            with severe systemic disease. After reviewing the  risks and benefits, the patient was deemed in                            satisfactory condition to undergo the procedure.                           After obtaining informed consent, the endoscope was                            passed under direct vision. Throughout the                            procedure, the patient's blood pressure, pulse, and                             oxygen saturations were monitored continuously. The                            [id number]-[id number] ([id number]) Olympus endoscope was introduced                            through the mouth, and advanced to the second part                            of duodenum. The upper GI endoscopy was                            accomplished without difficulty. The patient                            tolerated the procedure well. Scope In: Scope Out: Findings:      The examined portions of the nasopharynx, oropharynx and larynx were       normal.      The examined esophagus was normal.      A 4 cm hiatal hernia was present.      Diffuse mild inflammation characterized by granularity was found in the       entire examined stomach. Biopsies were taken with a cold forceps for       Helicobacter pylori testing. Estimated blood loss was minimal.      Pill remnants and a large amount of thick whitish residue vs mucus was       found in the gastric body.      The exam of the stomach was otherwise normal.      The examined duodenum was normal. Impression:               - The examined portions of the nasopharynx,                            oropharynx and larynx were normal.                           - Normal esophagus.                           - 4 cm  hiatal hernia.                           - Gastritis. Biopsied.                           - Pill remnant and excessive thick whitish residue                            vs mucus was found in the stomach.                           - Normal examined duodenum.                           - No bleeding sources identified. Suspect patient's                            hematemesis was secondary to either nasopharyngeal                            source or very small, self limited Mallory Weiss                            tear. Moderate Sedation:      N/A Recommendation:           - Return patient to hospital ward for ongoing care.                           - Resume previous diet.                            - Continue present medications.                           - Resume Eliquis  (apixaban ) at prior dose today.                           - Await pathology results.                           - Ok to stop octreotide                            - Reduce Protonix  to PO once daily.                           - GI will sign off at this time. Please reconsult                            with additional questions/concerns Procedure Code(s):        --- Professional ---                           [phone], Esophagogastroduodenoscopy, flexible,  transoral; with biopsy, single or multiple Diagnosis Code(s):        --- Professional ---                           K44.9, Diaphragmatic hernia without obstruction or                            gangrene                           K29.70, Gastritis, unspecified, without bleeding                           T18.2XXA, Foreign body in stomach, initial encounter                           K92.0, Hematemesis CPT copyright 2022 [employer]. All rights reserved. The codes documented in this report are preliminary and upon coder review may  be revised to meet current compliance requirements. Kayelynn Abdou E. Stacia, MD 08/22/2024 10:11:42 AM This report has been signed electronically. Number of Addenda: 0

## 2024-08-22 NOTE — Hospital Course (Addendum)
 78 year old with history of recent PE on Eliquis , CKD stage IV, DM 2, HLD, HTN, CVA, diastolic CHF, obesity, gout, infected total right knee replacement, otitis media presented with hematemesis from [facility]. There is also suspicion for left arm cellulitis/septic arthritis therefore orthopedic consulted. Left elbow aspirated by IR on 9/16.

## 2024-08-22 NOTE — Progress Notes (Addendum)
 Pts daughter is at bedside and asking about pts Colchicine  which she was taking PTA and states Uric Acid elevated. Provider on call notified via secure chat. No new orders received at this time.

## 2024-08-22 NOTE — Consult Note (Addendum)
 [facility]  [phone] [phone]    ADDENDUM:  Patient was personally and independently interviewed, examined and relevant elements of the history of present illness were reviewed in details and an assessment and plan was created. All elements of the patient's history of present illness, assessment and plan were discussed in detail with Olam JINNY Brunner, NP. The above documentation reflects our combined findings assessment and plan.   Briefly, 78 year old lady with history of questionable small PE on VQ scan in August 2025, admitted from SNF with complaints of hematemesis.  Given concern for GI bleeding, we were consulted regarding anticoagulation management.  She does have chronic anemia, likely anemia of chronic disease from renal dysfunction.  Iron  studies suggest mixed picture.  B12 and folate within normal limits.  Upper GI evaluation with endoscopy earlier today showed no obvious signs of bleeding.  GI has cleared her to resume Eliquis .  Okay to resume Eliquis  at renal dosing and monitor her closely.  No additional recommendations from hematology standpoint at this time.  Please call with any questions.  HEMATOLOGY CONSULTATION  PURPOSE OF CONSULTATION/CHIEF COMPLAINT:  PE on VQ scan GI bleed  Referring MD:  Dr. Caleen   HPI: Beth [name] is an 78 year old female patient who came to the hospital on 08/21/2024 from SNF with complaints of hematemesis.  Workup had previously been done which showed questionable PE.  Also showed GI bleeding.  Therefore hematology consult has been requested. Patient is seen today in the ED awake alert and oriented.  She reports that she has left ear pain for which she kept telling the doctor.  Reports that she fell out of her lift chair and her grandson and her daughter called EMS.  States she is not sure what else is going on.  Noted left upper extremity with brace intact. Patient appears to be a poor historian  regarding her medical history. Medical history significant for hypertension, hyperlipidemia, CHF, CVA, anemia, CKD, and diabetes. Surgical history includes hysterectomy, carpal tunnels surgery, and more recently in September 2025 she had myringotomy with left ear tube placement. Social history includes tobacco use, states she quit smoking cigarettes when she was young.  Denies drug use.  Denies alcohol use.  Worked in the [employer] denies occupational hazardous material exposure.   ASSESSMENT AND PLAN:  GI bleed Hematemesis - Patient admitted with complaints of hematemesis - Status post endoscopy 08/22/2024 with no obvious source of bleeding identified. - GI following - Hematology/Dr. Christia Domke following and will make further evaluation and treatment recommendations.  Anemia -Hemoglobin 9.2 - Likely multifactorial, secondary to renal dysfunction and multiple comorbidities - Baseline hemoglobin appears to be in the 9-10 range - Iron  studies done with adequate ferritin 321, sat 20% - No transfusional intervention required at this time - Continue to monitor CBC with differential  PE - VQ scan done 08/06/2024 showed small vaguely wedge-shaped perfusion anterior right midlung, PE could not be excluded. - Patient was started on Eliquis , continue per order as endoscopy revealed no source of bleeding - Monitor closely for bleeding  CKD - Elevated creatinine and BUN - Avoid nephrotoxic agents - Monitor CMP  History of CVA Hypertension Hyperlipidemia Diabetes - Continue antihypertensives - On insulin  sliding scale and Jardiance  - Continue statins and aspirin  - Primary team managing    Past Medical History:  Diagnosis Date   Anemia    Arthritis    Knee both knees   Blood transfusion without reported diagnosis 2012  anemia;pt denies transfusion stated was only on iron  tablet   Cataract    left   CKD (chronic kidney disease), stage III (HCC)    Diabetes mellitus without  complication (HCC)    Family history of anesthesia complication    sister very slow to awaken after anesthesia;severe vomiting    Gout    left elbow   Herpes infection 08/09/2014   Saw doctor Wed. 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   Other acute osteomyelitis, right femur (HCC) 07/23/2020   Pseudomonas aeruginosa infection 12/15/2018   Pseudomonas aeruginosa infection 12/15/2018   Septic arthritis of knee, right (HCC) 08/27/2020   Stroke (HCC) 2006   x 1 no deficits noted   :  Past Surgical History:  Procedure Laterality Date   ABDOMINAL HYSTERECTOMY  1983   CARPAL TUNNEL RELEASE Right 1983   colonscopy  June 21, 2014   EXCISIONAL TOTAL KNEE ARTHROPLASTY WITH ANTIBIOTIC SPACERS Right 02/16/2015   Procedure: RIGHT KNEE RESECTION ARTHROPLASTY WITH ANTIBIOTIC SPACERS;  Surgeon: Dempsey Moan, MD;  Location: [facility];  Service: Orthopedics;  Laterality: Right;   EXCISIONAL TOTAL KNEE ARTHROPLASTY WITH ANTIBIOTIC SPACERS Right 11/03/2018   Procedure: Right knee resection arthroplasty; antibiotic spacer;  Surgeon: Moan Dempsey, MD;  Location: [facility];  Service: Orthopedics;  Laterality: Right;  Adductor Block   I & D KNEE WITH POLY EXCHANGE Right 10/02/2014   Procedure: IRRIGATION AND DEBRIDEMENT RIGHT KNEE WITH POLY EXCHANGE;  Surgeon: Dempsey Moan GAILS, MD;  Location: [facility];  Service: Orthopedics;  Laterality: Right;   I & D KNEE WITH POLY EXCHANGE Right 08/27/2020   Procedure: IRRIGATION AND DEBRIDEMENT; SPACER EXCHANGE RIGHT KNEE with multiple specimens;  Surgeon: Moan Dempsey, MD;  Location: [facility];  Service: Orthopedics;  Laterality: Right;    INCISION AND DRAINAGE OF WOUND Right 01/14/2017   Procedure: IRRIGATION AND DEBRIDEMENT WOUND;  Surgeon: Dempsey Moan,  MD;  Location: [facility];  Service: Orthopedics;  Laterality: Right;  requests   JOINT REPLACEMENT  06/2014   right knee   MYRINGOTOMY WITH TUBE PLACEMENT Left 08/15/2024   Procedure: MYRINGOTOMY WITH LEFT EAR TUBE PLACEMENT;  Surgeon: Roark Rush, MD;  Location: [facility];  Service: ENT;  Laterality: Left;   nasal cauterization  2012   PATELLAR TENDON REPAIR Right 08/11/2014   Procedure: RIGHT PATELLA TENDON REPAIR;  Surgeon: Dempsey Moan GAILS, MD;  Location: [facility];  Service: Orthopedics;  Laterality: Right;   REIMPLANTATION OF TOTAL KNEE Right 05/23/2015   Procedure: RIGHT KNEE ARTHROPLASTY REIMPLANTATION;  Surgeon: Dempsey Moan, MD;  Location: [facility];  Service: Orthopedics;  Laterality: Right;   TOTAL KNEE ARTHROPLASTY Right 07/03/2014   Procedure: RIGHT TOTAL KNEE ARTHROPLASTY;  Surgeon: Dempsey Moan GAILS, MD;  Location: [facility];  Service: Orthopedics;  Laterality: Right;   TUBAL LIGATION    :  Allergies  Allergen Reactions   Cefdinir Swelling and Rash    Tolerated cephalosporins many times in the past   Aleve [Naproxen Sodium] Other (See Comments)    Heart races   Dorethia Phoenix ] Other (See Comments)    Nose bleeding   Ciprofloxacin  Other (See Comments)    Possible hamstring tendinopathy   Clonidine  Derivatives Other (See Comments)    Dizziness and weakness  Shellfish Allergy Nausea And Vomiting  :   Family History  Problem Relation Age of Onset   Ovarian cancer Mother    Cancer Mother    Peripheral vascular disease Father        with amputation of both legs   Hypertension Father    Heart disease Brother 26   Kidney disease Daughter    Heart disease Daughter 60   Diabetes Son    Diabetes Son    Colon cancer Neg Hx    Esophageal cancer Neg Hx    Stomach cancer Neg Hx    Rectal cancer Neg Hx   :   Social History   Socioeconomic History   Marital status: Widowed    Spouse name: Not on file   Number of children: 9   Years of education: 8th   Highest education level:  8th grade  Occupational History    Employer: RETIRED  Tobacco Use   Smoking status: Never   Smokeless tobacco: Never  Vaping Use   Vaping status: Never Used  Substance and Sexual Activity   Alcohol use: No   Drug use: No   Sexual activity: Not Currently  Other Topics Concern   Not on file  Social History Narrative   She has 6 living adult children and 3 who have passed away and many grandchildren and great grandchildren. Patient is retired. She worked part time Education officer, environmental houses and a Research officer, trade union.    One child in [place], others in [place], [place], [place], and [place]   Social Drivers of Health   Financial Resource Strain: Low Risk  (07/20/2024)   Received from [facility]   Overall Financial Resource Strain (CARDIA)    How hard is it for you to pay for the very basics like food, housing, medical [name], and heating?: Not hard at all  Food Insecurity: No Food Insecurity (08/15/2024)   Hunger Vital Sign    Worried About Running Out of Food in the Last Year: Never true    Ran Out of Food in the Last Year: Never true  Transportation Needs: No Transportation Needs (08/15/2024)   PRAPARE - Administrator, Civil Service (Medical): No    Lack of Transportation (Non-Medical): No  Recent Concern: Transportation Needs - Unmet Transportation Needs (07/20/2024)   Received from [facility] - Transportation    Lack of Transportation (Medical): Yes    Lack of Transportation (Non-Medical): Yes  Physical Activity: Inactive (07/20/2024)   Received from [facility]   Exercise Vital Sign    On average, how many days per week do you engage in moderate to strenuous exercise (like a brisk walk)?: 0 days    On average, how many minutes do you engage in exercise at this level?: 0 min  Stress: No Stress Concern Present (07/20/2024)   Received from [facility] - Occupational Stress Questionnaire    Do you feel stress - tense, restless,  nervous, or anxious, or unable to sleep at night because your mind is troubled all the time - these days?: Not at all  Social Connections: Moderately Integrated (08/15/2024)   Social Connection and Isolation Panel    Frequency of Communication with Friends and Family: More than three times a week    Frequency of Social Gatherings with Friends and Family: Three times a week    Attends Religious Services: More than 4 times per year    Active  Member of Clubs or Organizations: Yes    Attends Engineer, structural: More than 4 times per year    Marital Status: Never married  Recent Concern: Social Connections - Socially Isolated (07/20/2024)   Received from [facility]   Social Connection and Isolation Panel    In a typical week, how many times do you talk on the phone with family, friends, or neighbors?: More than three times a week    How often do you get together with friends or relatives?: Once a week    How often do you attend church or religious services?: Never    Do you belong to any clubs or organizations such as church groups, unions, fraternal or athletic groups, or school groups?: No    How often do you attend meetings of the clubs or organizations you belong to?: Never    Are you married, widowed, divorced, separated, never married, or living with a partner?: Never married  Intimate Partner Violence: Not At Risk (08/15/2024)   Humiliation, Afraid, Rape, and Kick questionnaire    Fear of Current or Ex-Partner: No    Emotionally Abused: No    Physically Abused: No    Sexually Abused: No  :   CURRENT MEDS: Current Facility-Administered Medications  Medication Dose Route Frequency Provider Last Rate Last Admin   acetaminophen  (TYLENOL ) tablet 650 mg  650 mg Oral Q6H PRN Doutova, Anastassia, MD       Or   acetaminophen  (TYLENOL ) suppository 650 mg  650 mg Rectal Q6H PRN Doutova, Anastassia, MD       atorvastatin  (LIPITOR ) tablet 40 mg  40 mg Oral QHS Doutova, Anastassia, MD    40 mg at 08/21/24 2208   ceFEPIme  (MAXIPIME ) 2 g in sodium chloride  0.9 % 100 mL IVPB  2 g Intravenous Q24H Gaines Carrier, RPH       ciprofloxacin -dexamethasone  (CIPRODEX ) 0.3-0.1 % OTIC (EAR) suspension 4 drop  4 drop Left EAR BID Doutova, Anastassia, MD   4 drop at 08/21/24 2209   diltiazem  (CARDIZEM  CD) 24 hr capsule 240 mg  240 mg Oral Daily Doutova, Anastassia, MD       famotidine  (PEPCID ) tablet 20 mg  20 mg Oral Daily Stacia Glendia BRAVO, MD       fentaNYL  (SUBLIMAZE ) injection 12.5-50 mcg  12.5-50 mcg Intravenous Q2H PRN Doutova, Anastassia, MD       hydrALAZINE  (APRESOLINE ) injection 10 mg  10 mg Intravenous Q4H PRN Doutova, Anastassia, MD   10 mg at 08/22/24 0526   HYDROcodone -acetaminophen  (NORCO/VICODIN) 5-325 MG per tablet 1-2 tablet  1-2 tablet Oral Q4H PRN Doutova, Anastassia, MD       insulin  aspart (novoLOG ) injection 0-9 Units  0-9 Units Subcutaneous Q4H Doutova, Anastassia, MD       ipratropium-albuterol  (DUONEB) 0.5-2.5 (3) MG/3ML nebulizer solution 3 mL  3 mL Nebulization Q4H PRN Amin, Ankit C, MD       labetalol  (NORMODYNE ) injection 5 mg  5 mg Intravenous Q2H PRN Doutova, Anastassia, MD   5 mg at 08/22/24 0055   pantoprazole  (PROTONIX ) injection 40 mg  40 mg Intravenous Q12H Doutova, Anastassia, MD   40 mg at 08/21/24 2108   valACYclovir  (VALTREX ) tablet 500 mg  500 mg Oral BID Doutova, Anastassia, MD   500 mg at 08/21/24 2212   [START ON 08/23/2024] vancomycin  (VANCOCIN ) IVPB 1000 mg/200 mL premix  1,000 mg Intravenous Q48H Oriet, Jonathan, [facility]       Current Outpatient Medications  Medication Sig Dispense  Refill   alendronate  (FOSAMAX ) 70 MG tablet TAKE 1 TABLET WEEKLY (TAKE WITH 8OZ OF WATER  30 MINUTES BEFORE BREAKFAST) (Patient taking differently: Take 70 mg by mouth every Tuesday.) 12 tablet 1   amoxicillin -clavulanate (AUGMENTIN ) 875-125 MG tablet Take 1 tablet by mouth 2 (two) times daily.     apixaban  (ELIQUIS ) 5 MG TABS tablet Take 2 tablets (10 mg total) by mouth  2 (two) times daily for 7 days, THEN 1 tablet (5 mg total) 2 (two) times daily. (Patient taking differently: Take 1 tablet (5mg ) by mouth twice daily.) 60 tablet 2   aspirin  EC 81 MG tablet Take 1 tablet (81 mg total) by mouth daily. Swallow whole. 30 tablet 1   atorvastatin  (LIPITOR ) 40 MG tablet Take 40 mg by mouth at bedtime.     azelastine  (ASTELIN ) 0.1 % nasal spray Place 1 spray into both nostrils 2 (two) times daily. Use in each nostril as directed 30 mL 12   ciprofloxacin -dexamethasone  (CIPRODEX ) OTIC suspension Place 4 drops into the left ear 2 (two) times daily. 7.5 mL 0   colchicine  0.6 MG tablet Take twice daily for gout attack. (may take every two hours up to 6 doses at acute onset) (Patient taking differently: Take 0.6 mg by mouth 2 (two) times daily.) 60 tablet 2   diltiazem  (CARDIZEM  CD) 240 MG 24 hr capsule TAKE ONE CAPSULE BY MOUTH DAILY 90 capsule 1   empagliflozin  (JARDIANCE ) 10 MG TABS tablet Take 1 tablet (10 mg total) by mouth daily. 90 tablet 3   ferrous sulfate  325 (65 FE) MG tablet Take 325 mg by mouth 2 (two) times a week. Take one tablet by mouth on Tue & Thurs.     hydrALAZINE  (APRESOLINE ) 25 MG tablet TAKE ONE (1) TABLET BY MOUTH 3 TIMES DAILY 90 tablet 0   HYDROcodone -acetaminophen  (NORCO/VICODIN) 5-325 MG tablet Take 1 tablet by mouth every 6 (six) hours as needed for moderate pain (pain score 4-6). 5 tablet 0   hydrocortisone  cream 1 % Apply topically 2 (two) times daily. To left ear 30 g 0   isosorbide  mononitrate (IMDUR ) 60 MG 24 hr tablet Take 1 tablet (60 mg total) by mouth daily. 30 tablet 1   ondansetron  (ZOFRAN -ODT) 4 MG disintegrating tablet Take 4 mg by mouth every 8 (eight) hours as needed for vomiting or nausea.     valACYclovir  (VALTREX ) 500 MG tablet Take 1 tablet (500 mg total) by mouth 2 (two) times daily. (Patient not taking: Reported on 08/21/2024)      PHYSICAL EXAMINATION: ECOG PERFORMANCE STATUS: 3 - Symptomatic, >50% confined to bed  Vitals:    08/22/24 1020 08/22/24 1030  BP: 109/83 (!) 122/90  Pulse: 88 84  Resp: (!) 30 (!) 25  Temp:    SpO2: 94% 93%   Filed Weights   08/22/24 0839  Weight: 218 lb 14.7 oz (99.3 kg)    GENERAL: alert, no distress and comfortable +ill-appearing SKIN: skin color, texture, turgor are normal, no rashes or significant lesions EYES: normal, conjunctiva are pink and non-injected, sclera clear OROPHARYNX: no exudate, no erythema and lips, buccal mucosa, and tongue normal  NECK: supple, thyroid  normal size, non-tender, without nodularity LYMPH: no palpable lymphadenopathy in the cervical, axillary or inguinal LUNGS: clear to auscultation and percussion with normal breathing effort HEART: regular rate & rhythm and no murmurs and no lower extremity edema ABDOMEN: abdomen soft, non-tender and normal bowel sounds MUSCULOSKELETAL: + RUE with immobilizer/sling intact PSYCH: alert & oriented x 3 with fluent  speech NEURO: no focal motor/sensory deficits   LABS: Lab Results  Component Value Date   WBC 7.0 08/22/2024   HGB 9.3 (L) 08/22/2024   HCT 30.5 (L) 08/22/2024   MCV 86.9 08/22/2024   PLT 356 08/22/2024    Lab Results  Component Value Date   WBC 7.0 08/22/2024   HGB 9.3 (L) 08/22/2024   HCT 30.5 (L) 08/22/2024   PLT 356 08/22/2024   GLUCOSE 132 (H) 08/21/2024   CHOL 183 08/01/2024   TRIG 77 08/01/2024   HDL 47 08/01/2024   LDLCALC 121 (H) 08/01/2024   ALT 16 08/21/2024   AST 21 08/21/2024   NA 137 08/21/2024   K 3.8 08/21/2024   CL 104 08/21/2024   CREATININE 2.13 (H) 08/21/2024   BUN 29 (H) 08/21/2024   CO2 20 (L) 08/21/2024   INR 1.9 (H) 08/21/2024   HGBA1C 6.4 (H) 08/22/2024    UE VENOUS DUPLEX (7am - 7pm) Result Date: 08/22/2024 UPPER VENOUS STUDY  Patient Name:  LEOTHA WESTERMEYER  Date of Exam:   08/21/2024 Medical Rec #: [id number]     Accession #:    [id number] Date of Birth: May 07, 1946     Patient Gender: F Patient Age:   48 years Exam Location:  [facility]  Procedure:      VAS US  UPPER EXTREMITY VENOUS DUPLEX Referring Phys: JAYSON PEREYRA --------------------------------------------------------------------------------  Indications: Pain, and Swelling of left upper extremity Risk Factors: 08/06/2024 PE History of gout. Anticoagulation: Eliquis . Limitations: Poor ultrasound/tissue interface, musculoskeletal features and Edema. Comparison Study: No prior upper extremity venous duplex on file Performing Technologist: Alberta Lis RVS  Examination Guidelines: A complete evaluation includes B-mode imaging, spectral Doppler, color Doppler, and power Doppler as needed of all accessible portions of each vessel. Bilateral testing is considered an integral part of a complete examination. Limited examinations for reoccurring indications may be performed as noted.  Right Findings: +----------+------------+---------+-----------+----------+-------+ RIGHT     CompressiblePhasicitySpontaneousPropertiesSummary +----------+------------+---------+-----------+----------+-------+ Subclavian               Yes       No                       +----------+------------+---------+-----------+----------+-------+  Left Findings: +----------+------------+---------+-----------+----------+-------+ LEFT      CompressiblePhasicitySpontaneousPropertiesSummary +----------+------------+---------+-----------+----------+-------+ IJV           Full       Yes       No                       +----------+------------+---------+-----------+----------+-------+ Subclavian               Yes       No                       +----------+------------+---------+-----------+----------+-------+ Axillary                 Yes       No                       +----------+------------+---------+-----------+----------+-------+ Brachial                 Yes       No                       +----------+------------+---------+-----------+----------+-------+ Radial        Full                                           +----------+------------+---------+-----------+----------+-------+  Ulnar         Full                                          +----------+------------+---------+-----------+----------+-------+ Cephalic      Full                                          +----------+------------+---------+-----------+----------+-------+ Basilic                  Yes       No                       +----------+------------+---------+-----------+----------+-------+  Summary:  Right: No evidence of thrombosis in the subclavian.  Left: No obvious evidence of deep vein thrombosis in the visualized veins of the upper extremity. No evidence of superficial vein thrombosis in the visualized veins of the upper extremity. Vessels in the upper arm are tortuous. Doppler waveforms are pulsatile throughout. Cystic structure noted lateral arm near [facility] measuring 2.2 X 1.69 cm, etiology unknown. Heterogenous area noted lateral proximal forearm, etiology unknown. Further imaging may be warranted if clinically indicated.Limited visualization secondary to limitations stated above.  *See table(s) above for measurements and observations.  Diagnosing physician: Debby Robertson Electronically signed by Debby Robertson on 08/22/2024 at 11:35:28 AM.    Final    DG Knee 1-2 Views Left Result Date: 08/22/2024 CLINICAL DATA:  855384 Pain [id number]. EXAM: LEFT KNEE - 1-2 VIEW COMPARISON:  None Available. FINDINGS: No acute fracture or dislocation. No aggressive osseous lesion. There are degenerative changes of the knee joint in the form of moderately reduced tibiofemoral compartment joint space, along with tibial spiking and tricompartmental osteophytosis. No knee effusion or focal soft tissue swelling. No radiopaque foreign bodies. IMPRESSION: No acute osseous abnormality of the left knee joint. 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 Electronically Signed   By: Ree Molt M.D.   On:  08/22/2024 11:07   CT ELBOW LEFT WO CONTRAST Result Date: 08/21/2024 CLINICAL DATA:  Soft tissue infection suspected, elbow, no prior imaging us  showing cystic mass, exam showing redness and swelling on the medial aspect above the elbow EXAM: CT OF THE UPPER LEFT EXTREMITY WITHOUT CONTRAST TECHNIQUE: Multidetector CT imaging of the upper left extremity was performed according to the standard protocol. RADIATION DOSE REDUCTION: This exam was performed according to the departmental dose-optimization program which includes automated exposure control, adjustment of the mA and/or kV according to patient size and/or use of iterative reconstruction technique. COMPARISON:  None Available. FINDINGS: Bones/Joint/Cartilage No cortical erosion or destruction. No evidence of fracture or dislocation. Multiloculated complex high-density elbow joint effusion that may represent blood products versus infection. Severe degenerative changes of the elbow. Ligaments Suboptimally assessed by CT. Muscles and Tendons Grossly unremarkable. Soft tissues Dorsal elbow subcutaneus soft tissue edema. No subcutaneus soft tissue emphysema. No organized fluid collection. IMPRESSION: 1. Multiloculated complex high density elbow effusion that may represent hemarthrosis versus septic joint. 2. Severe degenerative changes of the elbow. 3.  No acute displaced fracture or dislocation. 4.  No radiographic findings to suggest osteomyelitis. Electronically Signed   By: Morgane  Naveau M.D.   On: 08/21/2024 21:45   CT Knee Right Wo Contrast Result Date: 08/21/2024 CLINICAL DATA:  Hardware failure, right  knee EXAM: CT OF THE RIGHT KNEE WITHOUT CONTRAST TECHNIQUE: Multidetector CT imaging of the right knee was performed according to the standard protocol. Multiplanar CT image reconstructions were also generated. RADIATION DOSE REDUCTION: This exam was performed according to the departmental dose-optimization program which includes automated exposure  control, adjustment of the mA and/or kV according to patient size and/or use of iterative reconstruction technique. 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 Two threaded rods are present, 1 of which extends from the pre tibial subcutaneous tissues below the tibial tubercle and into the tibial component of the methacrylate proximally to the level of a transverse lucent plane through the methacrylate as shown on image 71 series 7. Second rod extends from the native bone proximal tibia through the tibial methacrylate but the rod is fractured at the level of a lucent plane between the tibial and the femoral component of the methacrylate with 8 mm of displacement as shown on image 67 series 7. The distal fragment continues through the femoral cavity component of the spacer in which it terminates near the proximal tip of the spacer. The femoral component of the spacer is separated from the native bone by lucency of variable thickness, between 2 and 6 mm. The tibial component is closely applied to the anterior tibia but has up to 6 mm of lucency around its posteromedial margin. With the fracture in the rod along the plane between the femoral and tibial component of the spacer, there is resulting pseudoarticulation with a non rigid attachment as shown on image 67 series 7. The patellar is flattened with associated volume loss. Capsular calcifications anteriorly in the knee joint and scattered elsewhere in the knee joint. Ligaments Suboptimally assessed by CT. Muscles and Tendons Severe regional muscular atrophy. Soft tissues Subcutaneous edema anterior to the patella and patellar tendon region. Atheromatous vascular calcifications. IMPRESSION: 1. Antibiotic impregnated spacer in the  distal femoral and proximal tibial cavities is partially fragmented, and the traversing threaded rod is fractured resulting in a pseudoarticulation. 2. The femoral component of the spacer is separated from the native bone by lucency of variable thickness, between 2 and 6 mm. 3. The tibial component is closely applied to the anterior tibia but has up to 6 mm of lucency around its posteromedial margin. 4. The patellar is flattened with associated volume loss. 5. Capsular calcifications anteriorly in the knee joint and scattered elsewhere in the knee joint. 6. Severe regional muscular atrophy. 7. Subcutaneous edema anterior to the patella and patellar tendon region. Electronically Signed   By: Ryan Salvage M.D.   On: 08/21/2024 18:38   CT Temporal Bones Wo Contrast Result Date: 08/21/2024 EXAM: CT TEMPORAL BONES WITHOUT CONTRAST 08/21/2024 03:26:31 PM TECHNIQUE: CT of the temporal bones was performed without the administration of intravenous contrast. Multiplanar reformatted images are provided for review. Automated exposure control, iterative reconstruction, and/or weight based adjustment of the mA/kV was utilized to reduce the radiation dose to as low as reasonably achievable. COMPARISON: None available. CLINICAL HISTORY: Left ear pain s/p myringotomy 5 days ago. FINDINGS: RIGHT TEMPORAL BONE: EXTERNAL AUDITORY CANAL: Clear. No bony erosion. Scutum is intact. MIDDLE EAR CAVITY: Clear. Ossicular chain is intact. MASTOID AIR CELLS: Clear. INNER EAR: The cochlea and vestibule are unremarkable. Normal mineralization of the otic capsule. Normal semicircular canals. The  vestibular aqueduct is not dilated. INTERNAL AUDITORY CANAL: Unremarkable. Normal bony canal of the facial nerve. LEFT TEMPORAL BONE: EXTERNAL AUDITORY CANAL: Opacified. The tympanic membrane is not excludedly visualized. MIDDLE EAR CAVITY: Opacified. Ossicles are intact. No focal osseous erosions are present. Fluid or soft tissue extend into the  epitympanum. MASTOID AIR CELLS: Effusion is present without coalescence. INNER EAR: The cochlea and vestibule are unremarkable. Normal mineralization of the otic capsule. Normal semicircular canals. The vestibular aqueduct is not dilated. INTERNAL AUDITORY CANAL: Unremarkable. Normal bony canal of the facial nerve. VASCULAR: Atherosclerotic calcifications are present in the cavernous carotid arteries bilaterally. No hyperdense vessel is present. BRAIN: Unremarkable. ORBITS: No acute abnormality. SINUSES: Right maxillary sinus is chronically opacified. Mild mucosal thickening is present within the anterior right ethmoid air cells. IMPRESSION: 1. Opacification of the left external auditory canal and middle ear cavity with extension into the epitympanum. Effusion in the left mastoid air cells without coalescence. No osseous erosion. 2. Chronically opacified right maxillary sinus and mild mucosal thickening in the anterior right ethmoid air cells. Electronically signed by: Lonni Necessary MD 08/21/2024 04:19 PM EDT RP Workstation: [id number]   CT CHEST ABDOMEN PELVIS WO CONTRAST Result Date: 08/21/2024 CLINICAL DATA:  Epigastric pain EXAM: CT CHEST, ABDOMEN AND PELVIS WITHOUT CONTRAST TECHNIQUE: Multidetector CT imaging of the chest, abdomen and pelvis was performed following the standard protocol without IV contrast. RADIATION DOSE REDUCTION: This exam was performed according to the departmental dose-optimization program which includes automated exposure control, adjustment of the mA and/or kV according to patient size and/or use of iterative reconstruction technique. COMPARISON:  None Available. FINDINGS: CT CHEST FINDINGS Cardiovascular: Aortic atherosclerosis. Cardiomegaly. Extensive three-vessel coronary artery calcifications and or stents. No pericardial effusion. Mediastinum/Nodes: No enlarged mediastinal, hilar, or axillary lymph nodes. Thyroid  gland, trachea, and esophagus demonstrate no significant  findings. Lungs/Pleura: Dependent bibasilar scarring or atelectasis. No pleural effusion or pneumothorax. Musculoskeletal: No chest wall abnormality. No acute osseous findings. CT ABDOMEN PELVIS FINDINGS Hepatobiliary: No solid liver abnormality is seen. Tiny gallstones and/or gravel (series 3, image 66). No gallbladder wall thickening, or biliary dilatation. Pancreas: Unremarkable. No pancreatic ductal dilatation or surrounding inflammatory changes. Spleen: Normal in size without significant abnormality. Adrenals/Urinary Tract: Adrenal glands are unremarkable. Small nonobstructive bilateral renal calculi and or renal vascular calcifications. No ureteral calculi or hydronephrosis. Numerous phleboliths throughout the low pelvis, which are not favored to reflect urinary tract calculi. Bladder is unremarkable. Stomach/Bowel: Stomach is within normal limits. Appendix not clearly visualized. No evidence of bowel wall thickening, distention, or inflammatory changes. Sigmoid diverticulosis. Vascular/Lymphatic: No significant vascular findings are present. No enlarged abdominal or pelvic lymph nodes. Reproductive: No mass or other abnormality. Other: No abdominal wall hernia or abnormality. No ascites. Musculoskeletal: No acute osseous findings. IMPRESSION: 1. No acute noncontrast CT findings of the chest, abdomen, or pelvis to explain epigastric pain. 2. Tiny gallstones and/or gravel. No CT evidence of acute cholecystitis. 3. Small nonobstructive bilateral renal calculi and or renal vascular calcifications. No ureteral calculi or hydronephrosis. Numerous phleboliths throughout the pelvis which are not favored to reflect urinary tract calculi. 4. Sigmoid diverticulosis without evidence of acute diverticulitis. 5. Cardiomegaly and coronary artery disease. Aortic Atherosclerosis (ICD10-I70.0). Electronically Signed   By: Marolyn JONETTA Jaksch M.D.   On: 08/21/2024 16:03   DG Knee 2 Views Right Result Date: 08/21/2024 EXAM: 1 or 2  VIEW(S) XRAY OF THE RIGHT KNEE 08/21/2024 12:57:00 PM COMPARISON: MRI from 06/29/2020. CLINICAL HISTORY: Knee pain. Reason for exam: hematemesis, knee pain; Per  triage notes: Patient BIB [facility] from [facility] for hematemesis that may have started around 1030. Patient has dried blood on her face and dried blood in an emesis basin. FINDINGS: BONES AND JOINTS: Postsurgical changes of distal femur and proximal tibia status post removal of right knee arthroplasty device and subsequent placement of cement and intramedullary fixation rods. The fixation rod traversing the knee joint is fractured with posterior displacement of the distal fracture component. Lucency along the cement bone interface is noted. Heterotopic calcification anterior to the distal femur. SOFT TISSUES: Severe vascular calcifications. IMPRESSION: 1. Fractured intramedullary fixation rod traversing the knee joint with posterior displacement of the distal fracture component. 2. Lucency along the cement-bone interface, cannot exclude osteomyelitis. 3. Heterotopic calcification anterior to the distal femur. 4. Severe vascular calcifications. Electronically signed by: Waddell Calk MD 08/21/2024 01:22 PM EDT RP Workstation: [id number]   DG Chest Portable 1 View Result Date: 08/21/2024 EXAM: 1 VIEW XRAY OF THE CHEST 08/21/2024 12:57:00 PM COMPARISON: 08/14/2024 CLINICAL HISTORY: Hematemsis. Reason for exam: hematemesis, knee pain; Per triage notes: Patient BIB [facility] from [facility] for hematemesis that may have started around 1030. Patient has dried blood on her face and dried blood in an emesis basin. FINDINGS: LUNGS AND PLEURA: Retrocardiac opacity is identified obscuring the left hemidiaphragm this may represent atelectasis or airspace disease. No pleural effusion. No pneumothorax. HEART AND MEDIASTINUM: Cardiomegaly. Atherosclerotic calcifications. BONES AND SOFT TISSUES: Multilevel degenerative changes of thoracic spine. No  acute osseous abnormality. IMPRESSION: 1. Retrocardiac opacity obscuring the left hemidiaphragm, possibly representing atelectasis or airspace disease. 2. Cardiomegaly and atherosclerotic calcifications. Electronically signed by: Waddell Calk MD 08/21/2024 01:11 PM EDT RP Workstation: [id number]   CT TEMPORAL BONES WO CONTRAST Result Date: 08/17/2024 CLINICAL DATA:  78 year old female with left facial droop, left mastoid effusion on recent brain MRI without and with contrast. EXAM: CT TEMPORAL BONES WITHOUT CONTRAST TECHNIQUE: Axial and coronal plane CT imaging of the petrous temporal bones was performed with thin-collimation image reconstruction. No intravenous contrast was administered. Multiplanar CT image reconstructions were also generated. RADIATION DOSE REDUCTION: This exam was performed according to the departmental dose-optimization program which includes automated exposure control, adjustment of the mA and/or kV according to patient size and/or use of iterative reconstruction technique. COMPARISON:  Brain MRI 08/14/2024. FINDINGS: RIGHT TEMPORAL BONE Right external auditory canal is patent. Right tympanic membrane appears normal. Right tympanic cavity is clear. Right scutum and ossicles appear intact and aligned. Right mastoid antrum is clear. Right mastoid air cells well aerated. Right otic capsule bone mineralization appears normal. Right internal auditory canal, cochlea, vestibule, vestibular aqueduct, semicircular canals, and course of the right 7th nerve appear normal. LEFT TEMPORAL BONE Subtotal opacification of the left external auditory canal (series 10, image 115 and series 6, image 60. Subtotal opacification of the right tympanic cavity. Left tympanic membrane obscured. Left mastoid antrum opacified. Mostly opacified left mastoid air cells, including air cells with fluid levels (series 6, image 40). No bony EAC erosion identified. Left scutum and ossicles appear to remain intact and aligned.  No mastoid coalescence identified. Left IAC, cochlea, vestibule, vestibular aqueduct, and left semicircular canals appear normal. Left stylomastoid foramen appears normal by CT. Vascular: Calcified atherosclerosis at the skull base. Limited intracranial: Grossly stable compared to head CT last month. Visible orbits/paranasal sinuses: Orbits not included. Right maxillary sinus mucoperiosteal thickening again noted. Trace layering fluid in the left sphenoid sinus. Soft tissues: Visible noncontrast deep soft tissue spaces of the face  appear negative. IMPRESSION: 1. Subtotal opacification of the left external auditory canal, left tympanic cavity, and left mastoid. No bone erosion is associated. Although nonspecific the constellation favors Infectious combined External Otitis, Otitis Media with reactive mastoid effusion. An EAC tumor (such as squamous cell carcinoma) could not be excluded. 2. Normal right temporal bone. 3. Chronic right maxillary sinusitis. Trace layering paranasal sinus fluid elsewhere. Electronically Signed   By: VEAR Hurst M.D.   On: 08/17/2024 12:36   VAS US  LOWER EXTREMITY VENOUS (DVT) Result Date: 08/16/2024  Lower Venous DVT Study Patient Name:  JACQULENE HUNTLEY  Date of Exam:   08/15/2024 Medical Rec #: [id number]     Accession #:    [id number] Date of Birth: 10-16-1946     Patient Gender: F Patient Age:   40 years Exam Location:  [facility] Procedure:      VAS US  LOWER EXTREMITY VENOUS (DVT) Referring Phys: MAXIMINO SHARPS --------------------------------------------------------------------------------  Indications: Pulmonary embolism.  Limitations: Body habitus and pt unable to tolerate compression. Performing Technologist: Elmarie Lindau, RVT  Examination Guidelines: A complete evaluation includes B-mode imaging, spectral Doppler, color Doppler, and power Doppler as needed of all accessible portions of each vessel. Bilateral testing is considered an integral part of a complete examination.  Limited examinations for reoccurring indications may be performed as noted. The reflux portion of the exam is performed with the patient in reverse Trendelenburg.  +---------+---------------+---------+-----------+----------+-------------------+ RIGHT    CompressibilityPhasicitySpontaneityPropertiesThrombus Aging      +---------+---------------+---------+-----------+----------+-------------------+ CFV      Full           Yes      Yes                                      +---------+---------------+---------+-----------+----------+-------------------+ SFJ      Full                                                             +---------+---------------+---------+-----------+----------+-------------------+ FV Prox  Full                                                             +---------+---------------+---------+-----------+----------+-------------------+ FV Mid                                                full color flow     +---------+---------------+---------+-----------+----------+-------------------+ FV Distal                                             Not well visualized +---------+---------------+---------+-----------+----------+-------------------+ PFV      Full                                                             +---------+---------------+---------+-----------+----------+-------------------+  POP                                                   Not well visualized +---------+---------------+---------+-----------+----------+-------------------+ PTV      Full                                                             +---------+---------------+---------+-----------+----------+-------------------+ PERO     Full                                                             +---------+---------------+---------+-----------+----------+-------------------+   +---------+---------------+---------+-----------+----------+---------------+ LEFT      CompressibilityPhasicitySpontaneityPropertiesThrombus Aging  +---------+---------------+---------+-----------+----------+---------------+ CFV      Full           Yes      Yes                                  +---------+---------------+---------+-----------+----------+---------------+ SFJ      Full                                                         +---------+---------------+---------+-----------+----------+---------------+ FV Prox  Full                                                         +---------+---------------+---------+-----------+----------+---------------+ FV Mid                                                full color flow +---------+---------------+---------+-----------+----------+---------------+ FV Distal                                             full color flow +---------+---------------+---------+-----------+----------+---------------+ PFV      Full                                                         +---------+---------------+---------+-----------+----------+---------------+ POP      Full           Yes      Yes                                  +---------+---------------+---------+-----------+----------+---------------+  PTV      Full                                                         +---------+---------------+---------+-----------+----------+---------------+ PERO     Full                                                         +---------+---------------+---------+-----------+----------+---------------+     Summary: RIGHT: - There is no evidence of deep vein thrombosis in the lower extremity. However, portions of this examination were limited- see technologist comments above.  LEFT: - There is no evidence of deep vein thrombosis in the lower extremity.  - No cystic structure found in the popliteal fossa.  *See table(s) above for measurements and observations. Electronically signed by Penne [place] MD on 08/16/2024  at 3:35:39 PM.    Final    MR Brain W and Wo Contrast Result Date: 08/14/2024 CLINICAL DATA:  L facial droop EXAM: MRI HEAD WITHOUT AND WITH CONTRAST TECHNIQUE: Multiplanar, multiecho pulse sequences of the brain and surrounding structures were obtained without and with intravenous contrast. CONTRAST:  10mL GADAVIST  GADOBUTROL  1 MMOL/ML IV SOLN COMPARISON:  CT head 08/01/2024. FINDINGS: Brain: No acute infarction, acute hemorrhage, hydrocephalus, extra-axial collection or mass lesion. Numerous punctate foci of susceptibility artifact predominantly in the cerebellum, thalami and basal ganglia and to lesser extent scattered throughout the supratentorial brain, compatible with chronic microhemorrhages. Moderate T2/FLAIR hyperintensities the white matter are compatible with chronic microvascular ischemic disease. No abnormal enhancement. Vascular: Normal flow voids. Skull and upper cervical spine: Normal marrow signal. Sinuses/Orbits: Opacified right maxillary sinus. Other: Left mastoid effusion. IMPRESSION: 1. No evidence of acute intracranial abnormality. 2. Moderate chronic microvascular ischemic disease. 3. Numerous chronic microhemorrhages, likely due to chronic hypertension. 4. Left mastoid effusion. 5. Opacified right maxillary sinus. Electronically Signed   By: Gilmore GORMAN Molt M.D.   On: 08/14/2024 22:16   DG Chest Port 1 View Result Date: 08/14/2024 CLINICAL DATA:  Sepsis, cough EXAM: PORTABLE CHEST 1 VIEW COMPARISON:  08/06/2024 FINDINGS: Single frontal view of the chest demonstrates stable enlargement of the cardiac silhouette. No acute airspace disease, effusion, or pneumothorax. No acute bony abnormalities. IMPRESSION: 1. Stable enlarged cardiac silhouette.  No acute airspace disease. Electronically Signed   By: Ozell Daring M.D.   On: 08/14/2024 21:13   NM Pulmonary Perfusion Result Date: 08/06/2024 EXAM: NM Lung Perfusion Scan. CLINICAL HISTORY: Pulmonary embolism (PE) suspected, high prob.  TECHNIQUE: Radiolabeled 4.36 mCi Tc88m MAA (technetium albumin  aggregated injection solution) was administered intravenously via the right forearm. Planar images of the lungs were obtained in multiple projections. RADIOPHARMACEUTICAL: 4.36 millicurie technetium to 59m albumin  aggregated (MAA). COMPARISON: 08/06/2024 chest radiograph. FINDINGS: PERFUSION: Small vaguely wedge-shaped perfusion defect in the anterior right mid lung without radiographic correlate. Otherwise, no perfusion defects. IMPRESSION: 1. Small vaguely wedge-shaped perfusion defect in the anterior right mid lung without radiographic correlate; segmental pulmonary embolism cannot be excluded. Otherwise no perfusion defects. Electronically signed by: Selinda Blue MD 08/06/2024 04:47 PM EDT RP Workstation: [id number]   DG CHEST PORT 1 VIEW Result Date: 08/06/2024 EXAM: 1 VIEW XRAY OF THE CHEST 08/06/2024 03:07:00  PM COMPARISON: 08/01/2024 CLINICAL HISTORY: Abnormal perfusion scan of lung FINDINGS: LUNGS AND PLEURA: No overt pulmonary edema. Low lung volumes. No focal pulmonary opacity. No pleural effusion. No pneumothorax. HEART AND MEDIASTINUM: Cardiomegaly. Aortic atherosclerosis. BONES AND SOFT TISSUES: No acute osseous abnormality. IMPRESSION: 1. No acute findings. Low lung volumes. 2. Cardiomegaly without overt edema. Electronically signed by: Selinda Blue MD 08/06/2024 03:45 PM EDT RP Workstation: [id number]   CT HEAD WO CONTRAST ( ) Result Date: 08/01/2024 CLINICAL DATA:  Initial evaluation for acute mental status change. EXAM: CT HEAD WITHOUT CONTRAST TECHNIQUE: Contiguous axial images were obtained from the base of the skull through the vertex without intravenous contrast. RADIATION DOSE REDUCTION: This exam was performed according to the departmental dose-optimization program which includes automated exposure control, adjustment of the mA and/or kV according to patient size and/or use of iterative reconstruction technique. COMPARISON:   CT from 12/12/2019 FINDINGS: Brain: Generalized age-related cerebral atrophy. Patchy hypodensity involving the supratentorial cerebral white matter, consistent with chronic small vessel ischemic disease, moderate in nature. Scattered remote lacunar infarcts present about the bilateral basal ganglia, thalami, and pons. No acute intracranial hemorrhage. No acute large vessel territory infarct. No mass lesion, midline shift or mass effect. No hydrocephalus or extra-axial fluid collection. Vascular: No abnormal hyperdense vessel. Calcified atherosclerosis present at skull base. Skull: Scalp soft tissues within normal limits.  Calvarium intact. Sinuses/Orbits: Globes and orbital soft tissues within normal limits. Changes of chronic right maxillary sinusitis noted. Trace left mastoid effusion noted, of doubtful significance. Other: None. IMPRESSION: 1. No acute intracranial abnormality. 2. Generalized age-related cerebral atrophy with moderate chronic small vessel ischemic disease, with multiple remote lacunar infarcts about the bilateral basal ganglia, thalami, and pons. Electronically Signed   By: Morene Hoard M.D.   On: 08/01/2024 19:03   ECHOCARDIOGRAM COMPLETE Result Date: 08/01/2024    ECHOCARDIOGRAM REPORT   Patient Name:   NITHYA MERIWEATHER Date of Exam: 08/01/2024 Medical Rec #:  [id number]    Height:       65.0 in Accession #:    [id number]   Weight:       231.0 lb Date of Birth:  Jan 28, 1946    BSA:          2.103 m Patient Age:    78 years     BP:           181/96 mmHg Patient Gender: F            HR:           57 bpm. Exam Location:  Inpatient Procedure: 2D Echo, Cardiac Doppler and Color Doppler (Both Spectral and Color            Flow Doppler were utilized during procedure). Indications:    Elevated Troponin  History:        Patient has prior history of Echocardiogram examinations, most                 recent 03/01/2016. CHF, CKD, stage 4; Risk Factors:Hypertension,                 Diabetes and  Dyslipidemia.  Sonographer:    Thea Norlander RCS Referring Phys: MAXIMINO DELENA SHARPS  Sonographer Comments: Technically difficult study due to poor echo windows, suboptimal parasternal window, suboptimal apical window and suboptimal subcostal window. IMPRESSIONS  1. Left ventricular ejection fraction, by estimation, is 60 to 65%. The left ventricle has normal function. The left ventricle has no regional wall motion abnormalities. Left ventricular  diastolic parameters are consistent with Grade I diastolic dysfunction (impaired relaxation).  2. Right ventricular systolic function is normal. The right ventricular size is normal.  3. The mitral valve is normal in structure. No evidence of mitral valve regurgitation. No evidence of mitral stenosis.  4. The aortic valve is tricuspid. Aortic valve regurgitation is not visualized. Aortic valve sclerosis/calcification is present, without any evidence of aortic stenosis. Aortic valve Vmax measures 1.25 m/s.  5. The inferior vena cava is normal in size with greater than 50% respiratory variability, suggesting right atrial pressure of 3 mmHg. FINDINGS  Left Ventricle: Left ventricular ejection fraction, by estimation, is 60 to 65%. The left ventricle has normal function. The left ventricle has no regional wall motion abnormalities. The left ventricular internal cavity size was normal in size. There is  no left ventricular hypertrophy. Left ventricular diastolic parameters are consistent with Grade I diastolic dysfunction (impaired relaxation). Normal left ventricular filling pressure. Right Ventricle: The right ventricular size is normal. No increase in right ventricular wall thickness. Right ventricular systolic function is normal. Left Atrium: Left atrial size was normal in size. Right Atrium: Right atrial size was normal in size. Pericardium: There is no evidence of pericardial effusion. Mitral Valve: The mitral valve is normal in structure. No evidence of mitral valve  regurgitation. No evidence of mitral valve stenosis. Tricuspid Valve: The tricuspid valve is normal in structure. Tricuspid valve regurgitation is trivial. No evidence of tricuspid stenosis. Aortic Valve: The aortic valve is tricuspid. Aortic valve regurgitation is not visualized. Aortic valve sclerosis/calcification is present, without any evidence of aortic stenosis. Aortic valve peak gradient measures 6.2 mmHg. Pulmonic Valve: The pulmonic valve was normal in structure. Pulmonic valve regurgitation is not visualized. No evidence of pulmonic stenosis. Aorta: The aortic root is normal in size and structure. Venous: The inferior vena cava is normal in size with greater than 50% respiratory variability, suggesting right atrial pressure of 3 mmHg. IAS/Shunts: No atrial level shunt detected by color flow Doppler.  LEFT VENTRICLE PLAX 2D LVOT diam:     1.90 cm   Diastology LV SV:         50        LV e' medial:    4.35 cm/s LV SV Index:   24        LV E/e' medial:  13.2 LVOT Area:     2.84 cm  LV e' lateral:   6.53 cm/s                          LV E/e' lateral: 8.8  RIGHT VENTRICLE             IVC RV S prime:     21.90 cm/s  IVC diam: 1.70 cm TAPSE (M-mode): 1.7 cm LEFT ATRIUM           Index        RIGHT ATRIUM           Index LA Vol (A2C): 61.5 ml 29.24 ml/m  RA Area:     12.70 cm                                    RA Volume:   20.10 ml  9.56 ml/m  AORTIC VALVE AV Area (Vmax): 1.77 cm AV Vmax:        125.00 cm/s AV Peak Grad:   6.2  mmHg LVOT Vmax:      78.10 cm/s LVOT Vmean:     49.800 cm/s LVOT VTI:       0.178 m  AORTA Ao Root diam: 2.90 cm Ao Asc diam:  3.50 cm MITRAL VALVE MV Area (PHT): 2.62 cm    SHUNTS MV Decel Time: 289 msec    Systemic VTI:  0.18 m MV E velocity: 57.60 cm/s  Systemic Diam: 1.90 cm MV A velocity: 83.30 cm/s MV E/A ratio:  0.69 Wilbert Bihari MD Electronically signed by Wilbert Bihari MD Signature Date/Time: 08/01/2024/5:27:37 PM    Final    DG CHEST PORT 1 VIEW Result Date:  08/01/2024 CLINICAL DATA:  Shortness of breath EXAM: PORTABLE CHEST 1 VIEW COMPARISON:  03/31/2014 FINDINGS: Low lung volumes accentuate cardiomediastinal silhouette and pulmonary vascularity. No focal consolidation, pleural effusion, or pneumothorax. No displaced rib fractures. IMPRESSION: Hypoventilation.  No acute cardiopulmonary process. Electronically Signed   By: Norman Gatlin M.D.   On: 08/01/2024 14:26     The total time spent in the appointment was 40 minutes encounter with patients including review of chart and various tests results, discussions about plan of [name] and coordination of [name] plan   All questions were answered. The patient knows to call the clinic with any problems, questions or concerns. No barriers to learning was detected.  Thank you for the courtesy of this consultation, Olam JINNY Brunner, NP  9/15/202512:28 PM

## 2024-08-22 NOTE — ED Notes (Signed)
 Admitting MD notfied on persistent hypertension .

## 2024-08-22 NOTE — Anesthesia Postprocedure Evaluation (Signed)
 Anesthesia Post Note  Patient: Beth [name]  Procedure(s) Performed: EGD (ESOPHAGOGASTRODUODENOSCOPY)     Patient location during evaluation: Endoscopy Anesthesia Type: MAC Level of consciousness: awake and alert Pain management: pain level controlled Vital Signs Assessment: post-procedure vital signs reviewed and stable Respiratory status: spontaneous breathing, nonlabored ventilation, respiratory function stable and patient connected to nasal cannula oxygen Cardiovascular status: blood pressure returned to baseline and stable Postop Assessment: no apparent nausea or vomiting Anesthetic complications: no   No notable events documented.  Last Vitals:  Vitals:   08/22/24 1020 08/22/24 1030  BP: 109/83 (!) 122/90  Pulse: 88 84  Resp: (!) 30 (!) 25  Temp:    SpO2: 94% 93%    Last Pain:  Vitals:   08/22/24 1030  TempSrc:   PainSc: 0-No pain                 Rome Ade

## 2024-08-22 NOTE — Progress Notes (Signed)
 PROGRESS NOTE    Beth [name]  FMW:[id number] DOB: 20-Nov-1946 DOA: 08/21/2024 PCP: Zollie Lowers, MD    Brief Narrative:  78 year old with history of recent PE on Eliquis , CKD stage IV, DM 2, HLD, HTN, CVA, diastolic CHF, obesity, gout, infected total right knee replacement, otitis media presented with hematemesis from [facility].  There is also suspicion for left arm cellulitis/septic arthritis therefore orthopedic consulted.   Assessment & Plan:  Hematemesis -Patient seen by OB GI, endoscopy performed on 9/15 showing normal esophagus, 4 cm hiatal hernia, some gastritis.  No obvious source of bleeding identified therefore okay to resume Eliquis  and p.o. intake.  Left elbow cellulitis Acute left knee pain and chronic right knee pain -Adamantly refusing any surgical intervention.  Seen by orthopedic.  MRI has been ordered for better evaluation.  If there is any fluid collection, will consult IR for aspiration.  In the meantime right upper extremity splint has been ordered. -Empiric vancomycin /cefepime   Elevated troponin - This is chronic.  Patient is not in any chest pain.  Discussed with cardiology, recommending conservative management and monitoring for now. Dr Burton cleared for Endo eval by GI.  They will be available for official consult if necessary.  DVT/PE - Cleared by GI, will plan to resume Eliquis   Facial droop with left otitis media/externa Facial nerve root compression and weakness - Had recent herpes breakout, seen by ENT underwent left myringotomy with tube placement on 9/8.  Current recommendations are to complete p.o. valacyclovir , steroid cream and outpatient ENT follow-up.  Holding Augmentin  as patient is already on Vanc/cefepime . Valacyclovir  EOT 9/17 Ear ointment EOT 9/20  Essential hypertension - On Cardizem , hydralazine , Imdur   Chronic diastolic CHF, EF 34% - Recent echocardiogram shows preserved EF with grade 1 DD  Diabetes mellitus type 2 -  Jardiance  - Sliding scale and Accu-Chek.  A1c 6.4  History of CVA with residual deficit - On aspirin , statin and Eliquis    CKD stage IV - Creatinine around baseline of 2.13   DVT prophylaxis: SCD    Code Status: Full Code Family Communication:   Status is: Inpatient Remains inpatient appropriate because: Continue hospital stay for further management and evaluation of left elbow cellulitis/pain   PT Follow up Recs:   Subjective:  Seen at bedside after endoscopy.  Still drowsy but wakes up to have basic conversation. Daugther is at the bedside.   Examination:  General exam: Appears calm and comfortable  Respiratory system: Clear to auscultation. Respiratory effort normal. Cardiovascular system: S1 & S2 heard, RRR. No JVD, murmurs, rubs, gallops or clicks. No pedal edema. Gastrointestinal system: Abdomen is nondistended, soft and nontender. No organomegaly or masses felt. Normal bowel sounds heard. Central nervous system: Alert and oriented. No focal neurological deficits. Extremities: Symmetric 5 x 5 power. Skin: No rashes, lesions or ulcers Psychiatry: Judgement and insight appear normal. Mood & affect appropriate.                Diet Orders (From admission, onward)     Start     Ordered   08/22/24 1020  Diet Carb Modified Fluid consistency: Thin; Room service appropriate? Yes  Diet effective now       Question Answer Comment  Diet-HS Snack? Nothing   Calorie Level Medium 1600-2000   Fluid consistency: Thin   Room service appropriate? Yes      08/22/24 1019            Objective: Vitals:   08/22/24 0839 08/22/24 1017 08/22/24 1020  08/22/24 1030  BP: (!) 178/58 (!) 117/56 109/83 (!) 122/90  Pulse: 85 86 88 84  Resp: (!) 22 (!) 23 (!) 30 (!) 25  Temp: 97.9 F (36.6 C) 97.7 F (36.5 C)    TempSrc: Temporal Temporal    SpO2: 93% 96% 94% 93%  Weight: 99.3 kg     Height: 5' 5 (1.651 m)       Intake/Output Summary (Last 24 hours) at 08/22/2024  1251 Last data filed at 08/22/2024 1018 Gross per 24 hour  Intake 1119.98 ml  Output --  Net 1119.98 ml   Filed Weights   08/22/24 0839  Weight: 99.3 kg    Scheduled Meds:  atorvastatin   40 mg Oral QHS   ciprofloxacin -dexamethasone   4 drop Left EAR BID   diltiazem   240 mg Oral Daily   famotidine   20 mg Oral Daily   insulin  aspart  0-9 Units Subcutaneous Q4H   [START ON 08/23/2024] pantoprazole   40 mg Oral QAC breakfast   valACYclovir   500 mg Oral BID   Continuous Infusions:  ceFEPime  (MAXIPIME ) IV     [START ON 08/23/2024] vancomycin       Nutritional status     Body mass index is 36.43 kg/m.  Data Reviewed:   CBC: Recent Labs  Lab 08/16/24 0539 08/17/24 0403 08/18/24 0234 08/19/24 0320 08/21/24 1438 08/22/24 0530  WBC 5.6 5.9 6.1 5.8 8.1 7.0  NEUTROABS 4.2 4.0 3.8 3.5 5.4  --   HGB 10.5* 10.2* 9.6* 9.1* 10.2* 9.3*  HCT 34.1* 32.8* 30.6* 28.7* 32.6* 30.5*  MCV 85.5 84.8 86.0 84.2 85.8 86.9  PLT 339 313 297 304 398 356   Basic Metabolic Panel: Recent Labs  Lab 08/16/24 0539 08/17/24 0403 08/18/24 0234 08/19/24 0320 08/21/24 1438 08/22/24 0435  NA 140 140 139 136 137  --   K 4.1 4.0 4.1 3.8 3.8  --   CL 108 108 108 105 104  --   CO2 18* 20* 18* 19* 20*  --   GLUCOSE 120* 128* 114* 126* 132*  --   BUN 28* 26* 26* 32* 29*  --   CREATININE 1.95* 1.93* 2.01* 2.13* 2.13*  --   CALCIUM  8.7* 8.3* 8.0* 7.8* 8.3*  --   MG 1.9 1.9 1.9 1.9  --  1.9   GFR: Estimated Creatinine Clearance: 24.6 mL/min (A) (by C-G formula based on SCr of 2.13 mg/dL (H)). Liver Function Tests: Recent Labs  Lab 08/21/24 1438  AST 21  ALT 16  ALKPHOS 84  BILITOT 1.0  PROT 6.9  ALBUMIN  2.2*   Recent Labs  Lab 08/21/24 1438  LIPASE 42   No results for input(s): AMMONIA in the last 168 hours. Coagulation Profile: Recent Labs  Lab 08/21/24 1438  INR 1.9*   Cardiac Enzymes: No results for input(s): CKTOTAL, CKMB, CKMBINDEX, TROPONINI in the last 168  hours. BNP (last 3 results) No results for input(s): PROBNP in the last 8760 hours. HbA1C: Recent Labs    08/22/24 0530  HGBA1C 6.4*   CBG: Recent Labs  Lab 08/21/24 2012 08/22/24 0003 08/22/24 0354 08/22/24 0751 08/22/24 1203  GLUCAP 119* 111* 116* 114* 137*   Lipid Profile: No results for input(s): CHOL, HDL, LDLCALC, TRIG, CHOLHDL, LDLDIRECT in the last 72 hours. Thyroid  Function Tests: No results for input(s): TSH, T4TOTAL, FREET4, T3FREE, THYROIDAB in the last 72 hours. Anemia Panel: Recent Labs    08/22/24 0435 08/22/24 0530  VITAMINB12 445  --   FOLATE >20.0  --  FERRITIN 321*  --   TIBC 125*  --   IRON  25*  --   RETICCTPCT  --  1.3   Sepsis Labs: Recent Labs  Lab 08/16/24 0539 08/17/24 0403 08/18/24 0234 08/19/24 0320  PROCALCITON <0.10 <0.10 <0.10 0.13    Recent Results (from the past 240 hours)  Blood Culture (routine x 2)     Status: None   Collection Time: 08/14/24  8:43 PM   Specimen: BLOOD  Result Value Ref Range Status   Specimen Description BLOOD LEFT ANTECUBITAL  Final   Special Requests   Final    BOTTLES DRAWN AEROBIC AND ANAEROBIC Blood Culture adequate volume   Culture   Final    NO GROWTH 5 DAYS Performed at [facility], [address], [place], [place] [address]    Report Status 08/19/2024 FINAL  Final  Resp panel by RT-PCR (RSV, Flu A&B, Covid) Anterior Nasal Swab     Status: None   Collection Time: 08/14/24  8:43 PM   Specimen: Anterior Nasal Swab  Result Value Ref Range Status   SARS Coronavirus 2 by RT PCR NEGATIVE NEGATIVE Final   Influenza A by PCR NEGATIVE NEGATIVE Final   Influenza B by PCR NEGATIVE NEGATIVE Final    Comment: (NOTE) The Xpert Xpress SARS-CoV-2/FLU/RSV plus assay is intended as an aid in the diagnosis of influenza from Nasopharyngeal swab specimens and should not be used as a sole basis for treatment. Nasal washings and aspirates are unacceptable for Xpert Xpress  SARS-CoV-2/FLU/RSV testing.  Fact Sheet for Patients: BloggerCourse.com  Fact Sheet for Healthcare Providers: SeriousBroker.it  This test is not yet approved or cleared by the [place]  FDA and has been authorized for detection and/or diagnosis of SARS-CoV-2 by FDA under an Emergency Use Authorization (EUA). This EUA will remain in effect (meaning this test can be used) for the duration of the COVID-19 declaration under Section 564(b)(1) of the Act, 21 U.S.C. section 360bbb-3(b)(1), unless the authorization is terminated or revoked.     Resp Syncytial Virus by PCR NEGATIVE NEGATIVE Final    Comment: (NOTE) Fact Sheet for Patients: BloggerCourse.com  Fact Sheet for Healthcare Providers: SeriousBroker.it  This test is not yet approved or cleared by the [place]  FDA and has been authorized for detection and/or diagnosis of SARS-CoV-2 by FDA under an Emergency Use Authorization (EUA). This EUA will remain in effect (meaning this test can be used) for the duration of the COVID-19 declaration under Section 564(b)(1) of the Act, 21 U.S.C. section 360bbb-3(b)(1), unless the authorization is terminated or revoked.  Performed at [facility], [address], [place], [place] [address]   Blood Culture (routine x 2)     Status: None   Collection Time: 08/14/24 10:30 PM   Specimen: BLOOD  Result Value Ref Range Status   Specimen Description BLOOD BLOOD LEFT HAND  Final   Special Requests   Final    BOTTLES DRAWN AEROBIC ONLY Blood Culture results may not be optimal due to an inadequate volume of blood received in culture bottles   Culture   Final    NO GROWTH 5 DAYS Performed at [facility], [address], [place], [place] [address]    Report Status 08/19/2024 FINAL  Final  Aerobic/Anaerobic Culture w Gram Stain (surgical/deep wound)     Status: None    Collection Time: 08/15/24 12:01 PM   Specimen: Ear, Left; Abscess  Result Value Ref Range Status   Specimen Description  ABSCESS  Final   Special Requests EAR,LEFT  Final   Gram Stain NO WBC SEEN NO ORGANISMS SEEN   Final   Culture   Final    No growth aerobically or anaerobically. Performed at [facility], [address] [place], [place] [address]    Report Status 08/20/2024 FINAL  Final  MRSA Next Gen by PCR, Nasal     Status: None   Collection Time: 08/15/24  3:15 PM   Specimen: Nasal Mucosa; Nasal Swab  Result Value Ref Range Status   MRSA by PCR Next Gen NOT DETECTED NOT DETECTED Final    Comment: (NOTE) The GeneXpert MRSA Assay (FDA approved for NASAL specimens only), is one component of a comprehensive MRSA colonization surveillance program. It is not intended to diagnose MRSA infection nor to guide or monitor treatment for MRSA infections. Test performance is not FDA approved in patients less than 40 years old. Performed at [facility], [address] [place], [place] [address]   Resp panel by RT-PCR (RSV, Flu A&B, Covid) Anterior Nasal Swab     Status: None   Collection Time: 08/21/24  2:38 PM   Specimen: Anterior Nasal Swab  Result Value Ref Range Status   SARS Coronavirus 2 by RT PCR NEGATIVE NEGATIVE Final   Influenza A by PCR NEGATIVE NEGATIVE Final   Influenza B by PCR NEGATIVE NEGATIVE Final    Comment: (NOTE) The Xpert Xpress SARS-CoV-2/FLU/RSV plus assay is intended as an aid in the diagnosis of influenza from Nasopharyngeal swab specimens and should not be used as a sole basis for treatment. Nasal washings and aspirates are unacceptable for Xpert Xpress SARS-CoV-2/FLU/RSV testing.  Fact Sheet for Patients: BloggerCourse.com  Fact Sheet for Healthcare Providers: SeriousBroker.it  This test is not yet approved or cleared by the [place]  FDA and has been authorized for detection and/or  diagnosis of SARS-CoV-2 by FDA under an Emergency Use Authorization (EUA). This EUA will remain in effect (meaning this test can be used) for the duration of the COVID-19 declaration under Section 564(b)(1) of the Act, 21 U.S.C. section 360bbb-3(b)(1), unless the authorization is terminated or revoked.     Resp Syncytial Virus by PCR NEGATIVE NEGATIVE Final    Comment: (NOTE) Fact Sheet for Patients: BloggerCourse.com  Fact Sheet for Healthcare Providers: SeriousBroker.it  This test is not yet approved or cleared by the [place]  FDA and has been authorized for detection and/or diagnosis of SARS-CoV-2 by FDA under an Emergency Use Authorization (EUA). This EUA will remain in effect (meaning this test can be used) for the duration of the COVID-19 declaration under Section 564(b)(1) of the Act, 21 U.S.C. section 360bbb-3(b)(1), unless the authorization is terminated or revoked.  Performed at [facility], [address], [place], [place] [address]   Culture, blood (Routine X 2) w Reflex to ID Panel     Status: None (Preliminary result)   Collection Time: 08/22/24  4:36 AM   Specimen: BLOOD  Result Value Ref Range Status   Specimen Description BLOOD SITE NOT SPECIFIED  Final   Special Requests   Final    BOTTLES DRAWN AEROBIC AND ANAEROBIC Blood Culture adequate volume   Culture   Final    NO GROWTH < 12 HOURS Performed at [facility], [address], [place], [place] [address]    Report Status PENDING  Incomplete         Radiology Studies: UE VENOUS DUPLEX (7am - 7pm) Result  Date: 08/22/2024 UPPER VENOUS STUDY  Patient Name:  RHETT NAJERA  Date of Exam:   08/21/2024 Medical Rec #: [id number]     Accession #:    [id number] Date of Birth: [birth year]     Patient Gender: F Patient Age:   45 years Exam Location:  [facility] Procedure:      VAS US  UPPER EXTREMITY VENOUS DUPLEX Referring Phys: JAYSON PEREYRA  --------------------------------------------------------------------------------  Indications: Pain, and Swelling of left upper extremity Risk Factors: 08/06/2024 PE History of gout. Anticoagulation: Eliquis . Limitations: Poor ultrasound/tissue interface, musculoskeletal features and Edema. Comparison Study: No prior upper extremity venous duplex on file Performing Technologist: Alberta Lis RVS  Examination Guidelines: A complete evaluation includes B-mode imaging, spectral Doppler, color Doppler, and power Doppler as needed of all accessible portions of each vessel. Bilateral testing is considered an integral part of a complete examination. Limited examinations for reoccurring indications may be performed as noted.  Right Findings: +----------+------------+---------+-----------+----------+-------+ RIGHT     CompressiblePhasicitySpontaneousPropertiesSummary +----------+------------+---------+-----------+----------+-------+ Subclavian               Yes       No                       +----------+------------+---------+-----------+----------+-------+  Left Findings: +----------+------------+---------+-----------+----------+-------+ LEFT      CompressiblePhasicitySpontaneousPropertiesSummary +----------+------------+---------+-----------+----------+-------+ IJV           Full       Yes       No                       +----------+------------+---------+-----------+----------+-------+ Subclavian               Yes       No                       +----------+------------+---------+-----------+----------+-------+ Axillary                 Yes       No                       +----------+------------+---------+-----------+----------+-------+ Brachial                 Yes       No                       +----------+------------+---------+-----------+----------+-------+ Radial        Full                                           +----------+------------+---------+-----------+----------+-------+ Ulnar         Full                                          +----------+------------+---------+-----------+----------+-------+ Cephalic      Full                                          +----------+------------+---------+-----------+----------+-------+ Basilic                  Yes       No                       +----------+------------+---------+-----------+----------+-------+  Summary:  Right: No evidence of thrombosis in the subclavian.  Left: No obvious evidence of deep vein thrombosis in the visualized veins of the upper extremity. No evidence of superficial vein thrombosis in the visualized veins of the upper extremity. Vessels in the upper arm are tortuous. Doppler waveforms are pulsatile throughout. Cystic structure noted lateral arm near [facility] measuring 2.2 X 1.69 cm, etiology unknown. Heterogenous area noted lateral proximal forearm, etiology unknown. Further imaging may be warranted if clinically indicated.Limited visualization secondary to limitations stated above.  *See table(s) above for measurements and observations.  Diagnosing physician: Debby Robertson Electronically signed by Debby Robertson on 08/22/2024 at 11:35:28 AM.    Final    DG Knee 1-2 Views Left Result Date: 08/22/2024 CLINICAL DATA:  855384 Pain [id number]. EXAM: LEFT KNEE - 1-2 VIEW COMPARISON:  None Available. FINDINGS: No acute fracture or dislocation. No aggressive osseous lesion. There are degenerative changes of the knee joint in the form of moderately reduced tibiofemoral compartment joint space, along with tibial spiking and tricompartmental osteophytosis. No knee effusion or focal soft tissue swelling. No radiopaque foreign bodies. IMPRESSION: No acute osseous abnormality of the left knee joint. 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 Electronically Signed   By: Ree Molt M.D.   On: 08/22/2024 11:07   CT ELBOW LEFT WO  CONTRAST Result Date: 08/21/2024 CLINICAL DATA:  Soft tissue infection suspected, elbow, no prior imaging us  showing cystic mass, exam showing redness and swelling on the medial aspect above the elbow EXAM: CT OF THE UPPER LEFT EXTREMITY WITHOUT CONTRAST TECHNIQUE: Multidetector CT imaging of the upper left extremity was performed according to the standard protocol. RADIATION DOSE REDUCTION: This exam was performed according to the departmental dose-optimization program which includes automated exposure control, adjustment of the mA and/or kV according to patient size and/or use of iterative reconstruction technique. COMPARISON:  None Available. FINDINGS: Bones/Joint/Cartilage No cortical erosion or destruction. No evidence of fracture or dislocation. Multiloculated complex high-density elbow joint effusion that may represent blood products versus infection. Severe degenerative changes of the elbow. Ligaments Suboptimally assessed by CT. Muscles and Tendons Grossly unremarkable. Soft tissues Dorsal elbow subcutaneus soft tissue edema. No subcutaneus soft tissue emphysema. No organized fluid collection. IMPRESSION: 1. Multiloculated complex high density elbow effusion that may represent hemarthrosis versus septic joint. 2. Severe degenerative changes of the elbow. 3.  No acute displaced fracture or dislocation. 4.  No radiographic findings to suggest osteomyelitis. Electronically Signed   By: Morgane  Naveau M.D.   On: 08/21/2024 21:45   CT Knee Right Wo Contrast Result Date: 08/21/2024 CLINICAL DATA:  Hardware failure, right knee EXAM: CT OF THE RIGHT KNEE WITHOUT CONTRAST TECHNIQUE: Multidetector CT imaging of the right knee was performed according to the standard protocol. Multiplanar CT image reconstructions were also generated. RADIATION DOSE REDUCTION: This exam was performed according to the departmental dose-optimization program which includes automated exposure control, adjustment of the mA and/or kV  according to patient size and/or use of iterative reconstruction technique. 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 Two threaded rods are present, 1 of which extends from the pre tibial subcutaneous tissues below the tibial tubercle and into the tibial component of the methacrylate proximally to the level of a transverse lucent plane through the methacrylate as shown on image 71  series 7. Second rod extends from the native bone proximal tibia through the tibial methacrylate but the rod is fractured at the level of a lucent plane between the tibial and the femoral component of the methacrylate with 8 mm of displacement as shown on image 67 series 7. The distal fragment continues through the femoral cavity component of the spacer in which it terminates near the proximal tip of the spacer. The femoral component of the spacer is separated from the native bone by lucency of variable thickness, between 2 and 6 mm. The tibial component is closely applied to the anterior tibia but has up to 6 mm of lucency around its posteromedial margin. With the fracture in the rod along the plane between the femoral and tibial component of the spacer, there is resulting pseudoarticulation with a non rigid attachment as shown on image 67 series 7. The patellar is flattened with associated volume loss. Capsular calcifications anteriorly in the knee joint and scattered elsewhere in the knee joint. Ligaments Suboptimally assessed by CT. Muscles and Tendons Severe regional muscular atrophy. Soft tissues Subcutaneous edema anterior to the patella and patellar tendon region. Atheromatous vascular calcifications. IMPRESSION: 1. Antibiotic impregnated spacer in the distal femoral and proximal tibial  cavities is partially fragmented, and the traversing threaded rod is fractured resulting in a pseudoarticulation. 2. The femoral component of the spacer is separated from the native bone by lucency of variable thickness, between 2 and 6 mm. 3. The tibial component is closely applied to the anterior tibia but has up to 6 mm of lucency around its posteromedial margin. 4. The patellar is flattened with associated volume loss. 5. Capsular calcifications anteriorly in the knee joint and scattered elsewhere in the knee joint. 6. Severe regional muscular atrophy. 7. Subcutaneous edema anterior to the patella and patellar tendon region. Electronically Signed   By: Ryan Salvage M.D.   On: 08/21/2024 18:38   CT Temporal Bones Wo Contrast Result Date: 08/21/2024 EXAM: CT TEMPORAL BONES WITHOUT CONTRAST 08/21/2024 03:26:31 PM TECHNIQUE: CT of the temporal bones was performed without the administration of intravenous contrast. Multiplanar reformatted images are provided for review. Automated exposure control, iterative reconstruction, and/or weight based adjustment of the mA/kV was utilized to reduce the radiation dose to as low as reasonably achievable. COMPARISON: None available. CLINICAL HISTORY: Left ear pain s/p myringotomy 5 days ago. FINDINGS: RIGHT TEMPORAL BONE: EXTERNAL AUDITORY CANAL: Clear. No bony erosion. Scutum is intact. MIDDLE EAR CAVITY: Clear. Ossicular chain is intact. MASTOID AIR CELLS: Clear. INNER EAR: The cochlea and vestibule are unremarkable. Normal mineralization of the otic capsule. Normal semicircular canals. The vestibular aqueduct is not dilated. INTERNAL AUDITORY CANAL: Unremarkable. Normal bony canal of the facial nerve. LEFT TEMPORAL BONE: EXTERNAL AUDITORY CANAL: Opacified. The tympanic membrane is not excludedly visualized. MIDDLE EAR CAVITY: Opacified. Ossicles are intact. No focal osseous erosions are present. Fluid or soft tissue extend into the epitympanum. MASTOID AIR CELLS:  Effusion is present without coalescence. INNER EAR: The cochlea and vestibule are unremarkable. Normal mineralization of the otic capsule. Normal semicircular canals. The vestibular aqueduct is not dilated. INTERNAL AUDITORY CANAL: Unremarkable. Normal bony canal of the facial nerve. VASCULAR: Atherosclerotic calcifications are present in the cavernous carotid arteries bilaterally. No hyperdense vessel is present. BRAIN: Unremarkable. ORBITS: No acute abnormality. SINUSES: Right maxillary sinus is chronically opacified. Mild mucosal thickening is present within the anterior right ethmoid air cells. IMPRESSION: 1. Opacification of the left external auditory canal and middle ear cavity with extension into  the epitympanum. Effusion in the left mastoid air cells without coalescence. No osseous erosion. 2. Chronically opacified right maxillary sinus and mild mucosal thickening in the anterior right ethmoid air cells. Electronically signed by: Lonni Necessary MD 08/21/2024 04:19 PM EDT RP Workstation: [id number]   CT CHEST ABDOMEN PELVIS WO CONTRAST Result Date: 08/21/2024 CLINICAL DATA:  Epigastric pain EXAM: CT CHEST, ABDOMEN AND PELVIS WITHOUT CONTRAST TECHNIQUE: Multidetector CT imaging of the chest, abdomen and pelvis was performed following the standard protocol without IV contrast. RADIATION DOSE REDUCTION: This exam was performed according to the departmental dose-optimization program which includes automated exposure control, adjustment of the mA and/or kV according to patient size and/or use of iterative reconstruction technique. COMPARISON:  None Available. FINDINGS: CT CHEST FINDINGS Cardiovascular: Aortic atherosclerosis. Cardiomegaly. Extensive three-vessel coronary artery calcifications and or stents. No pericardial effusion. Mediastinum/Nodes: No enlarged mediastinal, hilar, or axillary lymph nodes. Thyroid  gland, trachea, and esophagus demonstrate no significant findings. Lungs/Pleura: Dependent  bibasilar scarring or atelectasis. No pleural effusion or pneumothorax. Musculoskeletal: No chest wall abnormality. No acute osseous findings. CT ABDOMEN PELVIS FINDINGS Hepatobiliary: No solid liver abnormality is seen. Tiny gallstones and/or gravel (series 3, image 66). No gallbladder wall thickening, or biliary dilatation. Pancreas: Unremarkable. No pancreatic ductal dilatation or surrounding inflammatory changes. Spleen: Normal in size without significant abnormality. Adrenals/Urinary Tract: Adrenal glands are unremarkable. Small nonobstructive bilateral renal calculi and or renal vascular calcifications. No ureteral calculi or hydronephrosis. Numerous phleboliths throughout the low pelvis, which are not favored to reflect urinary tract calculi. Bladder is unremarkable. Stomach/Bowel: Stomach is within normal limits. Appendix not clearly visualized. No evidence of bowel wall thickening, distention, or inflammatory changes. Sigmoid diverticulosis. Vascular/Lymphatic: No significant vascular findings are present. No enlarged abdominal or pelvic lymph nodes. Reproductive: No mass or other abnormality. Other: No abdominal wall hernia or abnormality. No ascites. Musculoskeletal: No acute osseous findings. IMPRESSION: 1. No acute noncontrast CT findings of the chest, abdomen, or pelvis to explain epigastric pain. 2. Tiny gallstones and/or gravel. No CT evidence of acute cholecystitis. 3. Small nonobstructive bilateral renal calculi and or renal vascular calcifications. No ureteral calculi or hydronephrosis. Numerous phleboliths throughout the pelvis which are not favored to reflect urinary tract calculi. 4. Sigmoid diverticulosis without evidence of acute diverticulitis. 5. Cardiomegaly and coronary artery disease. Aortic Atherosclerosis (ICD10-I70.0). Electronically Signed   By: Marolyn JONETTA Jaksch M.D.   On: 08/21/2024 16:03   DG Knee 2 Views Right Result Date: 08/21/2024 EXAM: 1 or 2 VIEW(S) XRAY OF THE RIGHT KNEE  08/21/2024 12:57:00 PM COMPARISON: MRI from 06/29/2020. CLINICAL HISTORY: Knee pain. Reason for exam: hematemesis, knee pain; Per triage notes: Patient BIB [facility] from [facility] for hematemesis that may have started around 1030. Patient has dried blood on her face and dried blood in an emesis basin. FINDINGS: BONES AND JOINTS: Postsurgical changes of distal femur and proximal tibia status post removal of right knee arthroplasty device and subsequent placement of cement and intramedullary fixation rods. The fixation rod traversing the knee joint is fractured with posterior displacement of the distal fracture component. Lucency along the cement bone interface is noted. Heterotopic calcification anterior to the distal femur. SOFT TISSUES: Severe vascular calcifications. IMPRESSION: 1. Fractured intramedullary fixation rod traversing the knee joint with posterior displacement of the distal fracture component. 2. Lucency along the cement-bone interface, cannot exclude osteomyelitis. 3. Heterotopic calcification anterior to the distal femur. 4. Severe vascular calcifications. Electronically signed by: Waddell Calk MD 08/21/2024 01:22 PM EDT RP Workstation: [id number]  DG Chest Portable 1 View Result Date: 08/21/2024 EXAM: 1 VIEW XRAY OF THE CHEST 08/21/2024 12:57:00 PM COMPARISON: 08/14/2024 CLINICAL HISTORY: Hematemsis. Reason for exam: hematemesis, knee pain; Per triage notes: Patient BIB [facility] from [facility] rehab for hematemesis that may have started around 1030. Patient has dried blood on her face and dried blood in an emesis basin. FINDINGS: LUNGS AND PLEURA: Retrocardiac opacity is identified obscuring the left hemidiaphragm this may represent atelectasis or airspace disease. No pleural effusion. No pneumothorax. HEART AND MEDIASTINUM: Cardiomegaly. Atherosclerotic calcifications. BONES AND SOFT TISSUES: Multilevel degenerative changes of thoracic spine. No acute osseous abnormality.  IMPRESSION: 1. Retrocardiac opacity obscuring the left hemidiaphragm, possibly representing atelectasis or airspace disease. 2. Cardiomegaly and atherosclerotic calcifications. Electronically signed by: Waddell Calk MD 08/21/2024 01:11 PM EDT RP Workstation: [id number]           LOS: 1 day   Time spent= 35 mins    Burgess JAYSON Dare, MD [facility]  If 7PM-7AM, please contact night-coverage  08/22/2024, 12:51 PM

## 2024-08-22 NOTE — Interval H&P Note (Signed)
 History and Physical Interval Note:  08/22/2024 9:32 AM  Beth [name]  has presented today for surgery, with the diagnosis of Hematemesis.  The various methods of treatment have been discussed with the patient and family. After consideration of risks, benefits and other options for treatment, the patient has consented to  Procedure(s): EGD (ESOPHAGOGASTRODUODENOSCOPY) (N/A) as a surgical intervention.  The patient's history has been reviewed, patient examined, no change in status, stable for surgery.  I have reviewed the patient's chart and labs.  Questions were answered to the patient's satisfaction.     [name]

## 2024-08-23 ENCOUNTER — Encounter (HOSPITAL_COMMUNITY): Payer: Self-pay | Admitting: Gastroenterology

## 2024-08-23 ENCOUNTER — Inpatient Hospital Stay (HOSPITAL_COMMUNITY)

## 2024-08-23 DIAGNOSIS — M00822 Arthritis due to other bacteria, left elbow: Secondary | ICD-10-CM | POA: Diagnosis not present

## 2024-08-23 DIAGNOSIS — K920 Hematemesis: Secondary | ICD-10-CM | POA: Diagnosis not present

## 2024-08-23 DIAGNOSIS — Z87898 Personal history of other specified conditions: Secondary | ICD-10-CM | POA: Diagnosis not present

## 2024-08-23 LAB — SYNOVIAL CELL COUNT + DIFF, W/ CRYSTALS
Crystals, Fluid: NONE SEEN
Eosinophils-Synovial: 1 % (ref 0–1)
Lymphocytes-Synovial Fld: 1 % (ref 0–20)
Monocyte-Macrophage-Synovial Fluid: 2 % — ABNORMAL LOW (ref 50–90)
Neutrophil, Synovial: 95 % — ABNORMAL HIGH (ref 0–25)
Other Cells-SYN: 1
WBC, Synovial: UNDETERMINED /mm3 (ref 0–200)

## 2024-08-23 LAB — BASIC METABOLIC PANEL WITH GFR
Anion gap: 10 (ref 5–15)
BUN: 29 mg/dL — ABNORMAL HIGH (ref 8–23)
CO2: 19 mmol/L — ABNORMAL LOW (ref 22–32)
Calcium: 8.4 mg/dL — ABNORMAL LOW (ref 8.9–10.3)
Chloride: 107 mmol/L (ref 98–111)
Creatinine, Ser: 1.95 mg/dL — ABNORMAL HIGH (ref 0.44–1.00)
GFR, Estimated: 26 mL/min — ABNORMAL LOW (ref >60)
Glucose, Bld: 136 mg/dL — ABNORMAL HIGH (ref 70–99)
Potassium: 4.4 mmol/L (ref 3.5–5.1)
Sodium: 136 mmol/L (ref 135–145)

## 2024-08-23 LAB — GLUCOSE, CAPILLARY
Glucose-Capillary: 132 mg/dL — ABNORMAL HIGH (ref 70–99)
Glucose-Capillary: 132 mg/dL — ABNORMAL HIGH (ref 70–99)
Glucose-Capillary: 99 mg/dL (ref 70–99)
Glucose-Capillary: 146 mg/dL — ABNORMAL HIGH (ref 70–99)
Glucose-Capillary: 189 mg/dL — ABNORMAL HIGH (ref 70–99)

## 2024-08-23 LAB — CBC
HCT: 30.3 % — ABNORMAL LOW (ref 36.0–46.0)
Hemoglobin: 9.3 g/dL — ABNORMAL LOW (ref 12.0–15.0)
MCH: 26.5 pg (ref 26.0–34.0)
MCHC: 30.7 g/dL (ref 30.0–36.0)
MCV: 86.3 fL (ref 80.0–100.0)
Platelets: 380 K/uL (ref 150–400)
RBC: 3.51 MIL/uL — ABNORMAL LOW (ref 3.87–5.11)
RDW: 16.3 % — ABNORMAL HIGH (ref 11.5–15.5)
WBC: 5.5 K/uL (ref 4.0–10.5)
nRBC: 0 % (ref 0.0–0.2)

## 2024-08-23 LAB — MAGNESIUM: Magnesium: 2.2 mg/dL (ref 1.7–2.4)

## 2024-08-23 MED ORDER — ASPIRIN 81 MG PO TBEC
81.0000 mg | DELAYED_RELEASE_TABLET | Freq: Every day | ORAL | Status: DC
Start: 2024-08-23 — End: 2024-08-26
  Administered 2024-08-23 – 2024-08-26 (×4): 81 mg via ORAL
  Filled 2024-08-23 (×5): qty 1

## 2024-08-23 MED ORDER — AZELASTINE HCL 0.1 % NA SOLN
1.0000 | Freq: Two times a day (BID) | NASAL | Status: DC
  Administered 2024-08-23 – 2024-08-31 (×14): 1 via NASAL
  Filled 2024-08-23: qty 30

## 2024-08-23 MED ORDER — ISOSORBIDE MONONITRATE ER 60 MG PO TB24
60.0000 mg | ORAL_TABLET | Freq: Every day | ORAL | Status: DC
Start: 2024-08-23 — End: 2024-09-02
  Administered 2024-08-23 – 2024-09-01 (×10): 60 mg via ORAL
  Filled 2024-08-23 (×11): qty 1

## 2024-08-23 MED ORDER — IOHEXOL 180 MG/ML  SOLN
10.0000 mL | Freq: Once | INTRAMUSCULAR | Status: AC | PRN
  Administered 2024-08-23: 10 mL via INTRA_ARTICULAR

## 2024-08-23 MED ORDER — METRONIDAZOLE 500 MG PO TABS
500.0000 mg | ORAL_TABLET | Freq: Two times a day (BID) | ORAL | Status: DC
  Administered 2024-08-23 – 2024-08-29 (×13): 500 mg via ORAL
  Filled 2024-08-23 (×14): qty 1

## 2024-08-23 MED ORDER — HYDRALAZINE HCL 25 MG PO TABS
25.0000 mg | ORAL_TABLET | Freq: Three times a day (TID) | ORAL | Status: DC
Start: 2024-08-23 — End: 2024-08-26
  Administered 2024-08-23 – 2024-08-26 (×8): 25 mg via ORAL
  Filled 2024-08-23 (×9): qty 1

## 2024-08-23 MED ORDER — FERROUS SULFATE 325 (65 FE) MG PO TABS
325.0000 mg | ORAL_TABLET | ORAL | Status: DC
  Administered 2024-08-25 – 2024-09-01 (×3): 325 mg via ORAL
  Filled 2024-08-23 (×4): qty 1

## 2024-08-23 NOTE — Progress Notes (Signed)
 [facility]  Date of Admission:  08/21/2024   Total days of inpatient antibiotics 1  Principal Problem:   Hematemesis with nausea Active Problems:   Anemia, iron  deficiency   HLD (hyperlipidemia)   Essential hypertension   Diabetes type 2, controlled (HCC)   Infection of total right knee replacement (HCC)   CKD (chronic kidney disease), stage IV (HCC)   Acute suppurative otitis media of left ear without spontaneous rupture of tympanic membrane   Acute kidney injury superimposed on chronic kidney disease (HCC)   Prolonged QT interval   Elevated troponin   Otitis externa   Heart failure with preserved ejection fraction (HCC)   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   History of pulmonary embolism   Septic joint of left elbow (HCC)   Anticoagulated          Assessment: 78 year old female with a complicated right knee prosthetic joint infection requiring removal of prosthesis x 2 followed by spacer placement and treatment with cefepime  and suppressed with prolonged Cipro  back in 2023, PE, hypertension, hyperlipidemia,'s CKD presents with vomiting, possible hematemesis found to have #Left elbow septic arthritis #Recent hospitalization for left otitis externa/otitis media compression of left facial nerve status post myringotomy and tube placement on 08/15/2024 with ENT  discharged Augmentin , Valtrex  x 2 days from 9/12 -Patient states her elbow started hurting about 2 to 3 weeks ago, Denies any trauma.  Orthopedic consulted recommending getting MRI.  MRI showed joint effusion and synovial thickening. - Ortho noted left knee x-ray consistent with osteoarthritis.  Recommended conservative measures as patient does not want to consider surgical management would be total knee replacement.  Per left elbow, IR going forward with aspiration.  If it is infection, discussed possible surgical debridement.  Patient noted she did not want it at that time. - Discussed with  patient today that if aspiration shows infection, antibiotics alone will unlikely be curative. #Hematemesis - Endoscopy on 9/15 showed normal esophagus, hiatal hernia, gastritis.  No obvious bleeding identified. Recommendations:  -Continue vancomycin , cefepime  and  metro  - Continue Valtrex  - Follow blood cultures - Follow aspirate studies and cultures - Standard precautions - Communicated with primary   Microbiology:   Antibiotics: Unasyn  9/8 - 9/11 Valtrex  Cefepime  9/14-present Vancomycin  9/7, 9/14   Cultures: Blood 9/11 no growth 9/14   SUBJECTIVE: In bed. Interval: Afebrile overnight.  Review of Systems: Review of Systems  All other systems reviewed and are negative.    Scheduled Meds:  apixaban   5 mg Oral BID   atorvastatin   40 mg Oral QHS   ciprofloxacin -dexamethasone   4 drop Left EAR BID   diltiazem   240 mg Oral Daily   famotidine   20 mg Oral Daily   insulin  aspart  0-9 Units Subcutaneous Q4H   metroNIDAZOLE   500 mg Oral Q12H   pantoprazole   40 mg Oral QAC breakfast   valACYclovir   500 mg Oral BID   Continuous Infusions:  ceFEPime  (MAXIPIME ) IV Stopped (08/22/24 2200)   vancomycin      PRN Meds:.acetaminophen  **OR** acetaminophen , fentaNYL  (SUBLIMAZE ) injection, hydrALAZINE , HYDROcodone -acetaminophen , ipratropium-albuterol , labetalol  Allergies  Allergen Reactions   Cefdinir Swelling and Rash    Tolerated cephalosporins many times in the past   Aleve [Naproxen Sodium] Other (See Comments)    Heart races   Dorethia Jensen ] Other (See Comments)    Nose bleeding   Ciprofloxacin  Other (See Comments)    Possible hamstring tendinopathy   Clonidine  Derivatives Other (See  Comments)    Dizziness and weakness   Shellfish Allergy Nausea And Vomiting    OBJECTIVE: Vitals:   08/23/24 1114 08/23/24 1115 08/23/24 1155 08/23/24 1238  BP: (!) 193/111 (!) 193/111 (!) 144/129 (!) 172/87  Pulse:   78 73  Resp:   20   Temp:   97.9 F (36.6 C)   TempSrc:   Oral    SpO2:   98% 92%  Weight:      Height:       Body mass index is 36.43 kg/m.  Physical Exam Constitutional:      Appearance: Normal appearance.  HENT:     Head: Normocephalic and atraumatic.     Right Ear: Tympanic membrane normal.     Left Ear: Tympanic membrane normal.     Nose: Nose normal.     Mouth/Throat:     Mouth: Mucous membranes are moist.  Eyes:     Extraocular Movements: Extraocular movements intact.     Conjunctiva/sclera: Conjunctivae normal.     Pupils: Pupils are equal, round, and reactive to light.  Cardiovascular:     Rate and Rhythm: Normal rate and regular rhythm.     Heart sounds: No murmur heard.    No friction rub. No gallop.  Pulmonary:     Effort: Pulmonary effort is normal.     Breath sounds: Normal breath sounds.  Abdominal:     General: Abdomen is flat.     Palpations: Abdomen is soft.  Musculoskeletal:     Comments: Left arm in sling.  Skin:    General: Skin is warm and dry.  Neurological:     General: No focal deficit present.     Mental Status: She is alert and oriented to person, place, and time.  Psychiatric:        Mood and Affect: Mood normal.       Lab Results Lab Results  Component Value Date   WBC 5.5 08/23/2024   HGB 9.3 (L) 08/23/2024   HCT 30.3 (L) 08/23/2024   MCV 86.3 08/23/2024   PLT 380 08/23/2024    Lab Results  Component Value Date   CREATININE 1.95 (H) 08/23/2024   BUN 29 (H) 08/23/2024   NA 136 08/23/2024   K 4.4 08/23/2024   CL 107 08/23/2024   CO2 19 (L) 08/23/2024    Lab Results  Component Value Date   ALT 14 08/22/2024   AST 17 08/22/2024   ALKPHOS 80 08/22/2024   BILITOT 0.9 08/22/2024        Loney Stank, MD [facility] 08/23/2024, 1:22 PM

## 2024-08-23 NOTE — Consult Note (Signed)
 [facility]    Date of Admission:  08/21/2024   Total days of inpatient antibiotics 1        Reason for Consult: Left elbow infection    Principal Problem:   Hematemesis with nausea Active Problems:   Anemia, iron  deficiency   HLD (hyperlipidemia)   Essential hypertension   Diabetes type 2, controlled (HCC)   Infection of total right knee replacement (HCC)   CKD (chronic kidney disease), stage IV (HCC)   Acute suppurative otitis media of left ear without spontaneous rupture of tympanic membrane   Acute kidney injury superimposed on chronic kidney disease (HCC)   Prolonged QT interval   Elevated troponin   Otitis externa   Heart failure with preserved ejection fraction (HCC)   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   History of pulmonary embolism   Septic joint of left elbow (HCC)   Anticoagulated   Assessment: 78 year old female with a complicated right knee prosthetic joint infection requiring removal of prosthesis x 2 followed by spacer placement and treatment with cefepime  and suppressed with prolonged Cipro  back in 2023, PE, hypertension, hyperlipidemia,'s CKD presents with vomiting, possible hematemesis found to have #Left elbow septic arthritis #Recent hospitalization for left otitis externa/otitis media compression of left facial nerve status post myringotomy and tube placement on 08/15/2024 with ENT  discharged Augmentin , Valtrex  x 2 days from 9/12 -Patient states her elbow started hurting about 2 to 3 weeks ago, Denies any trauma.  Orthopedic consulted recommending getting MRI.  MRI showed joint effusion and synovial thickening. #Hematemesis - Endoscopy on 9/15 showed normal esophagus, hiatal hernia, gastritis.  No obvious bleeding identified. Recommendations:  -Continue vancomycin  and cefepime . Add metro tomorrow - Continue Valtrex  - Follow blood cultures - Follow surgical plan - Standard precautions  Evaluation of this patient requires  complex antimicrobial therapy evaluation and counseling + isolation needs for disease transmission risk assessment and mitigation   Microbiology:   Antibiotics: Unasyn  9/8 - 9/11 Valtrex  Cefepime  9/14-present Vancomycin  9/7, 9/14  Cultures: Blood 9/11 no growth 9/14 Urine  Other   HPI: Beth [name] is a 78 y.o. female with history of CKD stage III, hypertension, hyperlipidemia, CVA, PE, Right knee TKA in July 2015 followed by I&D and poly exchange, right knee resection in 2016 followed by spacer placement and reimplantation of new prosthesis complicated by failure.  Static infection requiring removal of prosthesis and spacer placement she has been on prolonged oral Cipro  back in 2023, recent hospitalization for left ear pain treated with Unasyn , recent antibiotic breakout required myringotomy with ENT discharged on Augmentin  and Valtrex  presents with vomiting, possible hematemesis on arrival patient afebrile, noted left elbow pain MRI of left elbow showed moderate-sized joint effusion and synovial thickening, considered joint aspiration.  Started on broad-spectrum antibiotics.  ID engaged.   Review of Systems: Review of Systems  All other systems reviewed and are negative.   Past Medical History:  Diagnosis Date   Anemia    Arthritis    Knee both knees   Blood transfusion without reported diagnosis 2012   anemia;pt denies transfusion stated was only on iron  tablet   Cataract    left   CKD (chronic kidney disease), stage III (HCC)    Diabetes mellitus without complication (HCC)    Family history of anesthesia complication    sister very slow to awaken after anesthesia;severe vomiting    Gout    left elbow   Herpes infection  08/09/2014   Saw doctor Wed. 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   Other acute osteomyelitis, right femur (HCC) 07/23/2020   Pseudomonas aeruginosa infection 12/15/2018   Pseudomonas aeruginosa infection 12/15/2018   Septic arthritis of knee, right (HCC) 08/27/2020   Stroke (HCC) 2006   x 1 no deficits noted     Social History   Tobacco Use   Smoking status: Never   Smokeless tobacco: Never  Vaping Use   Vaping status: Never Used  Substance Use Topics   Alcohol use: No   Drug use: No    Family History  Problem Relation Age of Onset   Ovarian cancer Mother    Cancer Mother    Peripheral vascular disease Father        with amputation of both legs   Hypertension Father    Heart disease Brother 23   Kidney disease Daughter    Heart disease Daughter 107   Diabetes Son    Diabetes Son    Colon cancer Neg Hx    Esophageal cancer Neg Hx    Stomach cancer Neg Hx    Rectal cancer Neg Hx    Scheduled Meds:  apixaban   5 mg Oral BID   atorvastatin   40 mg Oral QHS   ciprofloxacin -dexamethasone   4 drop Left EAR BID   diltiazem   240 mg Oral Daily   famotidine   20 mg Oral Daily   insulin  aspart  0-9 Units Subcutaneous Q4H   pantoprazole   40 mg Oral QAC breakfast   valACYclovir   500 mg Oral BID   Continuous Infusions:  ceFEPime  (MAXIPIME ) IV 2 g (08/22/24 2126)   vancomycin      PRN Meds:.acetaminophen  **OR** acetaminophen , fentaNYL  (SUBLIMAZE ) injection, hydrALAZINE , HYDROcodone -acetaminophen , ipratropium-albuterol , labetalol  Allergies  Allergen Reactions   Cefdinir Swelling and Rash    Tolerated cephalosporins many times in the past   Aleve [Naproxen Sodium] Other (See Comments)    Heart races   Dorethia Stai ] Other (See Comments)    Nose bleeding   Ciprofloxacin  Other (See Comments)    Possible hamstring tendinopathy   Clonidine  Derivatives Other (See Comments)    Dizziness and weakness   Shellfish Allergy Nausea And Vomiting    OBJECTIVE: Blood pressure (!) 163/83, pulse (P) 75,  temperature 97.8 F (36.6 C), temperature source Oral, resp. rate 19, height 5' 5 (1.651 m), weight 99.3 kg, SpO2 95%.  Physical Exam Constitutional:      Appearance: Normal appearance.  HENT:     Head: Normocephalic and atraumatic.     Right Ear: Tympanic membrane normal.     Left Ear: Tympanic membrane normal.     Nose: Nose normal.     Mouth/Throat:     Mouth: Mucous membranes are moist.  Eyes:     Extraocular Movements: Extraocular movements intact.     Conjunctiva/sclera: Conjunctivae normal.     Pupils: Pupils are equal, round, and reactive to light.  Cardiovascular:     Rate and Rhythm: Normal rate and regular rhythm.     Heart sounds: No murmur heard.    No friction rub. No gallop.  Pulmonary:     Effort: Pulmonary effort is normal.     Breath sounds: Normal breath sounds.  Abdominal:  General: Abdomen is flat.     Palpations: Abdomen is soft.  Musculoskeletal:     Comments: Left arm bandaged  Skin:    General: Skin is warm and dry.  Neurological:     General: No focal deficit present.     Mental Status: She is alert and oriented to person, place, and time.  Psychiatric:        Mood and Affect: Mood normal.     Lab Results Lab Results  Component Value Date   WBC 7.1 08/22/2024   HGB 9.2 (L) 08/22/2024   HCT 30.4 (L) 08/22/2024   MCV 86.9 08/22/2024   PLT 369 08/22/2024    Lab Results  Component Value Date   CREATININE 2.04 (H) 08/22/2024   BUN 31 (H) 08/22/2024   NA 136 08/22/2024   K 4.3 08/22/2024   CL 108 08/22/2024   CO2 19 (L) 08/22/2024    Lab Results  Component Value Date   ALT 14 08/22/2024   AST 17 08/22/2024   ALKPHOS 80 08/22/2024   BILITOT 0.9 08/22/2024       Beth Stank, MD [facility] 08/23/2024, 6:13 AM

## 2024-08-23 NOTE — Progress Notes (Signed)
    Subjective: 1 Day Post-Op Procedure(s) (LRB): EGD (ESOPHAGOGASTRODUODENOSCOPY) (N/A) Patient reports pain as 5 on 0-10 scale.   Denies CP or SOB.  Voiding without difficulty. Positive flatus. Objective: Vital signs in last 24 hours: Temp:  [97.7 F (36.5 C)-98.2 F (36.8 C)] 97.8 F (36.6 C) (09/16 0435) Pulse Rate:  [75-88] (P) 75 (09/15 2032) Resp:  [16-30] 19 (09/16 0033) BP: (109-178)/(56-93) 163/83 (09/16 0033) SpO2:  [84 %-96 %] 95 % (09/15 1807) Weight:  [99.3 kg] 99.3 kg (09/15 0839)  Intake/Output from previous day: 09/15 0701 - 09/16 0700 In: 1953.1 [P.O.:240; I.V.:1613.1; IV Piggyback:100] Out: 700 [Urine:700] Intake/Output this shift: No intake/output data recorded.  Labs: Recent Labs    08/21/24 1438 08/22/24 0530 08/22/24 1225 08/22/24 1946 08/23/24 0607  HGB 10.2* 9.3* 9.2* 9.2* 9.3*   Recent Labs    08/22/24 1946 08/23/24 0607  WBC 7.1 5.5  RBC 3.50* 3.51*  HCT 30.4* 30.3*  PLT 369 380   Recent Labs    08/22/24 1946 08/23/24 0607  NA 136 136  K 4.3 4.4  CL 108 107  CO2 19* 19*  BUN 31* 29*  CREATININE 2.04* 1.95*  GLUCOSE 149* 136*  CALCIUM  8.3* 8.4*   Recent Labs    08/21/24 1438  INR 1.9*    Physical Exam: Neurologically intact ABD soft Intact pulses distally Body mass index is 36.43 kg/m.  MRI of the left elbow: IMPRESSION: 1. Incomplete examination with only motion-degraded axial images obtained. 2. Moderate-sized joint effusion with synovial thickening, nonspecific. Septic arthritis not excluded. Consider joint aspiration. 3. Underlying advanced degenerative changes of the elbow. No definite acute osseous findings. 4. Soft tissue swelling around the elbow without evidence of focal fluid collection or foreign body.  X-rays of the left knee:IMPRESSION: No acute osseous abnormality of the left knee joint. 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  Assessment/Plan: 1 Day Post-Op  Procedure(s) (LRB): EGD (ESOPHAGOGASTRODUODENOSCOPY) (N/A) Patient has chronic right septic knee.  Spoke with Dr. Alusio and the patient would require aggressive surgical management possibly even amputation.  Reegan indicated that she is adamant about not having any additional surgical management of her knee.  Will defer ongoing care to medical team.  Patient may benefit from outpatient pain medical management. Left knee x-rays are consistent with osteoarthritis.  No evidence of acute process.  Recommend conservative care as patient does not want to consider surgical management which would be a total knee replacement. Left elbow.  Spoke with representative from interventional radiology they will move forward with a aspiration of the fluid collection seen on the MRI.  If this is infection I did speak with the patient about possible surgical debridement and she indicated she does not want that.  If it is infection then she would require conservative treatment which would be IV antibiotics.  More than likely this could also be gout and if so then I would defer management to the medical team.  The splint is for comfort only and could be removed as needed. At this point I am there is nothing further from an orthopedic standpoint that I could provide.  Will sign off.  If any questions or issues arise please not hesitate to contact me.  Donaciano JONETTA Sprang  [facility] [phone] 08/23/2024, 8:01 AM

## 2024-08-23 NOTE — Plan of Care (Signed)
  Problem: Education: Goal: Ability to describe self-care measures that may prevent or decrease complications (Diabetes Survival Skills Education) will improve Outcome: Progressing   Problem: Coping: Goal: Ability to adjust to condition or change in health will improve Outcome: Progressing   Problem: Metabolic: Goal: Ability to maintain appropriate glucose levels will improve Outcome: Progressing   Problem: Nutritional: Goal: Maintenance of adequate nutrition will improve Outcome: Progressing   Problem: Skin Integrity: Goal: Risk for impaired skin integrity will decrease Outcome: Progressing   Problem: Tissue Perfusion: Goal: Adequacy of tissue perfusion will improve Outcome: Progressing   Problem: Skin Integrity: Goal: Skin integrity will improve Outcome: Progressing   Problem: Education: Goal: Knowledge of General Education information will improve Description: Including pain rating scale, medication(s)/side effects and non-pharmacologic comfort measures Outcome: Progressing   Problem: Clinical Measurements: Goal: Ability to maintain clinical measurements within normal limits will improve Outcome: Progressing   Problem: Coping: Goal: Level of anxiety will decrease Outcome: Progressing   Problem: Pain Managment: Goal: General experience of comfort will improve and/or be controlled Outcome: Progressing

## 2024-08-23 NOTE — Significant Event (Signed)
 Rapid Response Event Note   Reason for Call :  Blurry vision  Initial Focused Assessment:  Patient is alert and oriented.  She is generally weak.  She states that she has had dysarthria since her stroke.  She states that her left facial droop is because of her ear infection.   When asked specifically about her vision she has full visual fields she states that the clock on the wall is blurry but she doesn't have her glasses. When asked to look at her phone she states that it looks similar to how it usually looks.   She is currently taking eliquis   BP 193/111  HR 80s   Interventions:  10 mg hydralazine  given for htn  Plan of Care:    Event Summary:   MD Notified: Dr Caleen notified by RN Call Time: 1100  Elvin [place], RN

## 2024-08-23 NOTE — Progress Notes (Signed)
 PROGRESS NOTE    Beth [name]  FMW:[id number] DOB: 05/14/1946 DOA: 08/21/2024 PCP: Zollie Lowers, MD    Brief Narrative:  78 year old with history of recent PE on Eliquis , CKD stage IV, DM 2, HLD, HTN, CVA, diastolic CHF, obesity, gout, infected total right knee replacement, otitis media presented with hematemesis from [name].  There is also suspicion for left arm cellulitis/septic arthritis therefore orthopedic consulted.   Assessment & Plan:  Hematemesis, stable -Patient seen by OB GI, endoscopy performed on 9/15 showing normal esophagus, 4 cm hiatal hernia, some gastritis.  No obvious source of bleeding identified therefore okay to resume Eliquis  and p.o. intake.  Left elbow cellulitis Acute left knee pain and chronic right knee pain -Adamantly refusing any surgical intervention as right knee would require amputation.  Seen by orthopedic.   -MRI suggestive of left elbow fluid collection, IR to aspirate this. -Seen by ID, continue antibiotic  Elevated troponin - This is chronic.  Patient is not in any chest pain.  Discussed with cardiology, recommending conservative management and monitoring for now. Dr Burton cleared for Endo eval by GI.  They will be available for official consult if necessary.  DVT/PE - Cleared by GI to resume Eliquis   Facial droop with left otitis media/externa Facial nerve root compression and weakness - Had recent herpes breakout, seen by ENT underwent left myringotomy with tube placement on 9/8.  Current recommendations are to complete p.o. valacyclovir , steroid cream and outpatient ENT follow-up.  Holding Augmentin  as patient is already on Vanc/cefepime . Valacyclovir  EOT 9/17 Ear ointment EOT 9/20  Essential hypertension - On Cardizem , hydralazine , Imdur   Chronic diastolic CHF, EF 34% - Recent echocardiogram shows preserved EF with grade 1 DD  Diabetes mellitus type 2 - Jardiance  - Sliding scale and Accu-Chek.  A1c 6.4  History of CVA  with residual deficit - On aspirin , statin and Eliquis    CKD stage IV - Creatinine around baseline of 2.13   DVT prophylaxis: SCD    Code Status: Full Code Family Communication:   Status is: Inpatient Remains inpatient appropriate because: Continue hospital stay for further management and evaluation of left elbow cellulitis/pain   PT Follow up Recs:   Subjective: Seen at bedside had elevated blood pressure this morning with some blurry vision but reports no different than usual.  Adamantly did not planning any surgical procedures.  Hesitantly agreeable to left elbow drainage  Examination:  General exam: Appears calm and comfortable  Respiratory system: Clear to auscultation. Respiratory effort normal. Cardiovascular system: S1 & S2 heard, RRR. No JVD, murmurs, rubs, gallops or clicks. No pedal edema. Gastrointestinal system: Abdomen is nondistended, soft and nontender. No organomegaly or masses felt. Normal bowel sounds heard. Central nervous system: Alert and oriented. No focal neurological deficits. Extremities: Symmetric 5 x 5 power. Skin: No rashes, lesions or ulcers, left elbow dressing in place Psychiatry: Judgement and insight appear normal. Mood & affect appropriate.                Diet Orders (From admission, onward)     Start     Ordered   08/22/24 1020  Diet Carb Modified Fluid consistency: Thin; Room service appropriate? Yes  Diet effective now       Question Answer Comment  Diet-HS Snack? Nothing   Calorie Level Medium 1600-2000   Fluid consistency: Thin   Room service appropriate? Yes      08/22/24 1019            Objective: Vitals:  08/23/24 1114 08/23/24 1115 08/23/24 1155 08/23/24 1238  BP: (!) 193/111 (!) 193/111 (!) 144/129 (!) 172/87  Pulse:   78 73  Resp:   20   Temp:   97.9 F (36.6 C)   TempSrc:   Oral   SpO2:   98% 92%  Weight:      Height:        Intake/Output Summary (Last 24 hours) at 08/23/2024 1338 Last data  filed at 08/23/2024 1156 Gross per 24 hour  Intake 340 ml  Output 1150 ml  Net -810 ml   Filed Weights   08/22/24 0839  Weight: 99.3 kg    Scheduled Meds:  apixaban   5 mg Oral BID   aspirin  EC  81 mg Oral Daily   atorvastatin   40 mg Oral QHS   azelastine   1 spray Each Nare BID   ciprofloxacin -dexamethasone   4 drop Left EAR BID   diltiazem   240 mg Oral Daily   famotidine   20 mg Oral Daily   [START ON 08/25/2024] ferrous sulfate   325 mg Oral Once per day on Monday Thursday   hydrALAZINE   25 mg Oral Q8H   insulin  aspart  0-9 Units Subcutaneous Q4H   isosorbide  mononitrate  60 mg Oral Daily   metroNIDAZOLE   500 mg Oral Q12H   pantoprazole   40 mg Oral QAC breakfast   valACYclovir   500 mg Oral BID   Continuous Infusions:  ceFEPime  (MAXIPIME ) IV Stopped (08/22/24 2200)   vancomycin       Nutritional status     Body mass index is 36.43 kg/m.  Data Reviewed:   CBC: Recent Labs  Lab 08/17/24 0403 08/18/24 0234 08/19/24 0320 08/21/24 1438 08/22/24 0530 08/22/24 1225 08/22/24 1946 08/23/24 0607  WBC 5.9 6.1 5.8 8.1 7.0 7.7 7.1 5.5  NEUTROABS 4.0 3.8 3.5 5.4  --   --   --   --   HGB 10.2* 9.6* 9.1* 10.2* 9.3* 9.2* 9.2* 9.3*  HCT 32.8* 30.6* 28.7* 32.6* 30.5* 30.9* 30.4* 30.3*  MCV 84.8 86.0 84.2 85.8 86.9 88.5 86.9 86.3  PLT 313 297 304 398 356 378 369 380   Basic Metabolic Panel: Recent Labs  Lab 08/18/24 0234 08/19/24 0320 08/21/24 1438 08/22/24 0435 08/22/24 1203 08/22/24 1946 08/23/24 0607  NA 139 136 137  --   --  136 136  K 4.1 3.8 3.8  --   --  4.3 4.4  CL 108 105 104  --   --  108 107  CO2 18* 19* 20*  --   --  19* 19*  GLUCOSE 114* 126* 132*  --   --  149* 136*  BUN 26* 32* 29*  --   --  31* 29*  CREATININE 2.01* 2.13* 2.13*  --   --  2.04* 1.95*  CALCIUM  8.0* 7.8* 8.3*  --   --  8.3* 8.4*  MG 1.9 1.9  --  1.9 2.0  --  2.2  PHOS  --   --   --   --  4.3  --   --    GFR: Estimated Creatinine Clearance: 26.8 mL/min (A) (by C-G formula based on SCr  of 1.95 mg/dL (H)). Liver Function Tests: Recent Labs  Lab 08/21/24 1438 08/22/24 1946  AST 21 17  ALT 16 14  ALKPHOS 84 80  BILITOT 1.0 0.9  PROT 6.9 6.6  ALBUMIN  2.2* 2.0*   Recent Labs  Lab 08/21/24 1438  LIPASE 42   No results for input(s): AMMONIA in  the last 168 hours. Coagulation Profile: Recent Labs  Lab 08/21/24 1438  INR 1.9*   Cardiac Enzymes: No results for input(s): CKTOTAL, CKMB, CKMBINDEX, TROPONINI in the last 168 hours. BNP (last 3 results) No results for input(s): PROBNP in the last 8760 hours. HbA1C: Recent Labs    08/22/24 0530  HGBA1C 6.4*   CBG: Recent Labs  Lab 08/22/24 1619 08/22/24 2032 08/23/24 0443 08/23/24 0816 08/23/24 1153  GLUCAP 149* 154* 132* 132* 99   Lipid Profile: No results for input(s): CHOL, HDL, LDLCALC, TRIG, CHOLHDL, LDLDIRECT in the last 72 hours. Thyroid  Function Tests: No results for input(s): TSH, T4TOTAL, FREET4, T3FREE, THYROIDAB in the last 72 hours. Anemia Panel: Recent Labs    08/22/24 0435 08/22/24 0530  VITAMINB12 445  --   FOLATE >20.0  --   FERRITIN 321*  --   TIBC 125*  --   IRON  25*  --   RETICCTPCT  --  1.3   Sepsis Labs: Recent Labs  Lab 08/17/24 0403 08/18/24 0234 08/19/24 0320  PROCALCITON <0.10 <0.10 0.13    Recent Results (from the past 240 hours)  Blood Culture (routine x 2)     Status: None   Collection Time: 08/14/24  8:43 PM   Specimen: BLOOD  Result Value Ref Range Status   Specimen Description BLOOD LEFT ANTECUBITAL  Final   Special Requests   Final    BOTTLES DRAWN AEROBIC AND ANAEROBIC Blood Culture adequate volume   Culture   Final    NO GROWTH 5 DAYS Performed at [facility], [address] [place], [place] [address]    Report Status 08/19/2024 FINAL  Final  Resp panel by RT-PCR (RSV, Flu A&B, Covid) Anterior Nasal Swab     Status: None   Collection Time: 08/14/24  8:43 PM   Specimen: Anterior Nasal Swab  Result Value  Ref Range Status   SARS Coronavirus 2 by RT PCR NEGATIVE NEGATIVE Final   Influenza A by PCR NEGATIVE NEGATIVE Final   Influenza B by PCR NEGATIVE NEGATIVE Final    Comment: (NOTE) The Xpert Xpress SARS-CoV-2/FLU/RSV plus assay is intended as an aid in the diagnosis of influenza from Nasopharyngeal swab specimens and should not be used as a sole basis for treatment. Nasal washings and aspirates are unacceptable for Xpert Xpress SARS-CoV-2/FLU/RSV testing.  Fact Sheet for Patients: BloggerCourse.com  Fact Sheet for Healthcare Providers: SeriousBroker.it  This test is not yet approved or cleared by the [place]  FDA and has been authorized for detection and/or diagnosis of SARS-CoV-2 by FDA under an Emergency Use Authorization (EUA). This EUA will remain in effect (meaning this test can be used) for the duration of the COVID-19 declaration under Section 564(b)(1) of the Act, 21 U.S.C. section 360bbb-3(b)(1), unless the authorization is terminated or revoked.     Resp Syncytial Virus by PCR NEGATIVE NEGATIVE Final    Comment: (NOTE) Fact Sheet for Patients: BloggerCourse.com  Fact Sheet for Healthcare Providers: SeriousBroker.it  This test is not yet approved or cleared by the [place]  FDA and has been authorized for detection and/or diagnosis of SARS-CoV-2 by FDA under an Emergency Use Authorization (EUA). This EUA will remain in effect (meaning this test can be used) for the duration of the COVID-19 declaration under Section 564(b)(1) of the Act, 21 U.S.C. section 360bbb-3(b)(1), unless the authorization is terminated or revoked.  Performed at [facility], [address], [place], [place] [address]   Blood Culture (routine x 2)  Status: None   Collection Time: 08/14/24 10:30 PM   Specimen: BLOOD  Result Value Ref Range Status   Specimen Description BLOOD  BLOOD LEFT HAND  Final   Special Requests   Final    BOTTLES DRAWN AEROBIC ONLY Blood Culture results may not be optimal due to an inadequate volume of blood received in culture bottles   Culture   Final    NO GROWTH 5 DAYS Performed at [facility], [address], [place], [place] [address]    Report Status 08/19/2024 FINAL  Final  Aerobic/Anaerobic Culture w Gram Stain (surgical/deep wound)     Status: None   Collection Time: 08/15/24 12:01 PM   Specimen: Ear, Left; Abscess  Result Value Ref Range Status   Specimen Description ABSCESS  Final   Special Requests EAR,LEFT  Final   Gram Stain NO WBC SEEN NO ORGANISMS SEEN   Final   Culture   Final    No growth aerobically or anaerobically. Performed at [facility], [address] [place], [place] [address]    Report Status 08/20/2024 FINAL  Final  MRSA Next Gen by PCR, Nasal     Status: None   Collection Time: 08/15/24  3:15 PM   Specimen: Nasal Mucosa; Nasal Swab  Result Value Ref Range Status   MRSA by PCR Next Gen NOT DETECTED NOT DETECTED Final    Comment: (NOTE) The GeneXpert MRSA Assay (FDA approved for NASAL specimens only), is one component of a comprehensive MRSA colonization surveillance program. It is not intended to diagnose MRSA infection nor to guide or monitor treatment for MRSA infections. Test performance is not FDA approved in patients less than 64 years old. Performed at [facility], [address] [place], [place] [address]   Resp panel by RT-PCR (RSV, Flu A&B, Covid) Anterior Nasal Swab     Status: None   Collection Time: 08/21/24  2:38 PM   Specimen: Anterior Nasal Swab  Result Value Ref Range Status   SARS Coronavirus 2 by RT PCR NEGATIVE NEGATIVE Final   Influenza A by PCR NEGATIVE NEGATIVE Final   Influenza B by PCR NEGATIVE NEGATIVE Final    Comment: (NOTE) The Xpert Xpress SARS-CoV-2/FLU/RSV plus assay is intended as an aid in the diagnosis of influenza from Nasopharyngeal  swab specimens and should not be used as a sole basis for treatment. Nasal washings and aspirates are unacceptable for Xpert Xpress SARS-CoV-2/FLU/RSV testing.  Fact Sheet for Patients: BloggerCourse.com  Fact Sheet for Healthcare Providers: SeriousBroker.it  This test is not yet approved or cleared by the [place]  FDA and has been authorized for detection and/or diagnosis of SARS-CoV-2 by FDA under an Emergency Use Authorization (EUA). This EUA will remain in effect (meaning this test can be used) for the duration of the COVID-19 declaration under Section 564(b)(1) of the Act, 21 U.S.C. section 360bbb-3(b)(1), unless the authorization is terminated or revoked.     Resp Syncytial Virus by PCR NEGATIVE NEGATIVE Final    Comment: (NOTE) Fact Sheet for Patients: BloggerCourse.com  Fact Sheet for Healthcare Providers: SeriousBroker.it  This test is not yet approved or cleared by the [place]  FDA and has been authorized for detection and/or diagnosis of SARS-CoV-2 by FDA under an Emergency Use Authorization (EUA). This EUA will remain in effect (meaning this test can be used) for the duration of the COVID-19 declaration under Section 564(b)(1) of the Act, 21 U.S.C. section 360bbb-3(b)(1), unless the authorization is terminated or  revoked.  Performed at [facility], [address], [place], [place] [address]   Culture, blood (Routine X 2) w Reflex to ID Panel     Status: None (Preliminary result)   Collection Time: 08/22/24  4:36 AM   Specimen: BLOOD  Result Value Ref Range Status   Specimen Description BLOOD SITE NOT SPECIFIED  Final   Special Requests   Final    BOTTLES DRAWN AEROBIC AND ANAEROBIC Blood Culture adequate volume   Culture   Final    NO GROWTH 1 DAY Performed at [facility], [address] [place], [place] [address]    Report Status  PENDING  Incomplete         Radiology Studies: MR ELBOW LEFT WO CONTRAST Result Date: 08/22/2024 CLINICAL DATA:  Left elbow swelling and erythema. Septic arthritis suspected. EXAM: MRI OF THE LEFT ELBOW WITHOUT CONTRAST-limited TECHNIQUE: Multiplanar, multisequence MR imaging of the elbow was attempted. Patient was unable to tolerate the examination. Only axial images were obtained, and these are motion degraded. No intravenous contrast was administered. COMPARISON:  CT 08/21/2024 FINDINGS: The study is quite limited by its incomplete nature and motion. There is moderate-sized joint effusion as seen on recent CT. This appears complex with synovial thickening. Underlying advanced ulnar humeral and radiocapitellar degenerative changes with joint space narrowing and osteophytes, also better seen on CT. No definite acute osseous findings are seen. There is soft tissue swelling around the elbow without evidence of focal fluid collection or foreign body. IMPRESSION: 1. Incomplete examination with only motion-degraded axial images obtained. 2. Moderate-sized joint effusion with synovial thickening, nonspecific. Septic arthritis not excluded. Consider joint aspiration. 3. Underlying advanced degenerative changes of the elbow. No definite acute osseous findings. 4. Soft tissue swelling around the elbow without evidence of focal fluid collection or foreign body. Electronically Signed   By: Elsie Perone M.D.   On: 08/22/2024 18:38   UE VENOUS DUPLEX (7am - 7pm) Result Date: 08/22/2024 UPPER VENOUS STUDY  Patient Name:  SHONIA SKILLING  Date of Exam:   08/21/2024 Medical Rec #: [id number]     Accession #:    [id number] Date of Birth: 03/11/1946     Patient Gender: F Patient Age:   67 years Exam Location:  [facility] Procedure:      VAS US  UPPER EXTREMITY VENOUS DUPLEX Referring Phys: JAYSON PEREYRA --------------------------------------------------------------------------------  Indications: Pain, and Swelling of  left upper extremity Risk Factors: 08/06/2024 PE History of gout. Anticoagulation: Eliquis . Limitations: Poor ultrasound/tissue interface, musculoskeletal features and Edema. Comparison Study: No prior upper extremity venous duplex on file Performing Technologist: Alberta Lis RVS  Examination Guidelines: A complete evaluation includes B-mode imaging, spectral Doppler, color Doppler, and power Doppler as needed of all accessible portions of each vessel. Bilateral testing is considered an integral part of a complete examination. Limited examinations for reoccurring indications may be performed as noted.  Right Findings: +----------+------------+---------+-----------+----------+-------+ RIGHT     CompressiblePhasicitySpontaneousPropertiesSummary +----------+------------+---------+-----------+----------+-------+ Subclavian               Yes       No                       +----------+------------+---------+-----------+----------+-------+  Left Findings: +----------+------------+---------+-----------+----------+-------+ LEFT      CompressiblePhasicitySpontaneousPropertiesSummary +----------+------------+---------+-----------+----------+-------+ IJV           Full       Yes       No                       +----------+------------+---------+-----------+----------+-------+  Subclavian               Yes       No                       +----------+------------+---------+-----------+----------+-------+ Axillary                 Yes       No                       +----------+------------+---------+-----------+----------+-------+ Brachial                 Yes       No                       +----------+------------+---------+-----------+----------+-------+ Radial        Full                                          +----------+------------+---------+-----------+----------+-------+ Ulnar         Full                                           +----------+------------+---------+-----------+----------+-------+ Cephalic      Full                                          +----------+------------+---------+-----------+----------+-------+ Basilic                  Yes       No                       +----------+------------+---------+-----------+----------+-------+  Summary:  Right: No evidence of thrombosis in the subclavian.  Left: No obvious evidence of deep vein thrombosis in the visualized veins of the upper extremity. No evidence of superficial vein thrombosis in the visualized veins of the upper extremity. Vessels in the upper arm are tortuous. Doppler waveforms are pulsatile throughout. Cystic structure noted lateral arm near [facility] measuring 2.2 X 1.69 cm, etiology unknown. Heterogenous area noted lateral proximal forearm, etiology unknown. Further imaging may be warranted if clinically indicated.Limited visualization secondary to limitations stated above.  *See table(s) above for measurements and observations.  Diagnosing physician: Debby Robertson Electronically signed by Debby Robertson on 08/22/2024 at 11:35:28 AM.    Final    DG Knee 1-2 Views Left Result Date: 08/22/2024 CLINICAL DATA:  855384 Pain [id number]. EXAM: LEFT KNEE - 1-2 VIEW COMPARISON:  None Available. FINDINGS: No acute fracture or dislocation. No aggressive osseous lesion. There are degenerative changes of the knee joint in the form of moderately reduced tibiofemoral compartment joint space, along with tibial spiking and tricompartmental osteophytosis. No knee effusion or focal soft tissue swelling. No radiopaque foreign bodies. IMPRESSION: No acute osseous abnormality of the left knee joint. 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 Electronically Signed   By: Ree Molt M.D.   On: 08/22/2024 11:07   CT ELBOW LEFT WO CONTRAST Result Date: 08/21/2024 CLINICAL DATA:  Soft tissue infection suspected, elbow, no prior imaging us  showing cystic mass, exam  showing redness and swelling on the medial  aspect above the elbow EXAM: CT OF THE UPPER LEFT EXTREMITY WITHOUT CONTRAST TECHNIQUE: Multidetector CT imaging of the upper left extremity was performed according to the standard protocol. RADIATION DOSE REDUCTION: This exam was performed according to the departmental dose-optimization program which includes automated exposure control, adjustment of the mA and/or kV according to patient size and/or use of iterative reconstruction technique. COMPARISON:  None Available. FINDINGS: Bones/Joint/Cartilage No cortical erosion or destruction. No evidence of fracture or dislocation. Multiloculated complex high-density elbow joint effusion that may represent blood products versus infection. Severe degenerative changes of the elbow. Ligaments Suboptimally assessed by CT. Muscles and Tendons Grossly unremarkable. Soft tissues Dorsal elbow subcutaneus soft tissue edema. No subcutaneus soft tissue emphysema. No organized fluid collection. IMPRESSION: 1. Multiloculated complex high density elbow effusion that may represent hemarthrosis versus septic joint. 2. Severe degenerative changes of the elbow. 3.  No acute displaced fracture or dislocation. 4.  No radiographic findings to suggest osteomyelitis. Electronically Signed   By: Morgane  Naveau M.D.   On: 08/21/2024 21:45   CT Knee Right Wo Contrast Result Date: 08/21/2024 CLINICAL DATA:  Hardware failure, right knee EXAM: CT OF THE RIGHT KNEE WITHOUT CONTRAST TECHNIQUE: Multidetector CT imaging of the right knee was performed according to the standard protocol. Multiplanar CT image reconstructions were also generated. RADIATION DOSE REDUCTION: This exam was performed according to the departmental dose-optimization program which includes automated exposure control, adjustment of the mA and/or kV according to patient size and/or use of iterative reconstruction technique. 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 Two threaded rods are present, 1 of which extends from the pre tibial subcutaneous tissues below the tibial tubercle and into the tibial component of the methacrylate proximally to the level of a transverse lucent plane through the methacrylate as shown on image 71 series 7. Second rod extends from the native bone proximal tibia through the tibial methacrylate but the rod is fractured at the level of a lucent plane between the tibial and the femoral component of the methacrylate with 8 mm of displacement as shown on image 67 series 7. The distal fragment continues through the femoral cavity component of the spacer in which it terminates near the proximal tip of the spacer. The femoral component of the spacer is separated from the native bone by lucency of variable thickness, between 2 and 6 mm. The tibial component is closely applied to the anterior tibia but has up to 6 mm of lucency around its posteromedial margin. With the fracture in the rod along the plane between the femoral and tibial component of the spacer, there is resulting pseudoarticulation with a non rigid attachment as shown on image 67 series 7. The patellar is flattened with associated volume loss. Capsular calcifications anteriorly in the knee joint and scattered elsewhere in the knee joint. Ligaments Suboptimally assessed by CT. Muscles and Tendons Severe regional muscular atrophy. Soft tissues Subcutaneous edema anterior to the patella and patellar tendon region. Atheromatous vascular calcifications. IMPRESSION: 1. Antibiotic impregnated spacer in the distal femoral and proximal tibial cavities is partially fragmented, and the traversing threaded rod is fractured resulting in a pseudoarticulation. 2. The femoral component  of the spacer is separated from the native bone by lucency of variable thickness, between 2 and 6 mm. 3. The tibial component is  closely applied to the anterior tibia but has up to 6 mm of lucency around its posteromedial margin. 4. The patellar is flattened with associated volume loss. 5. Capsular calcifications anteriorly in the knee joint and scattered elsewhere in the knee joint. 6. Severe regional muscular atrophy. 7. Subcutaneous edema anterior to the patella and patellar tendon region. Electronically Signed   By: Ryan Salvage M.D.   On: 08/21/2024 18:38   CT Temporal Bones Wo Contrast Result Date: 08/21/2024 EXAM: CT TEMPORAL BONES WITHOUT CONTRAST 08/21/2024 03:26:31 PM TECHNIQUE: CT of the temporal bones was performed without the administration of intravenous contrast. Multiplanar reformatted images are provided for review. Automated exposure control, iterative reconstruction, and/or weight based adjustment of the mA/kV was utilized to reduce the radiation dose to as low as reasonably achievable. COMPARISON: None available. CLINICAL HISTORY: Left ear pain s/p myringotomy 5 days ago. FINDINGS: RIGHT TEMPORAL BONE: EXTERNAL AUDITORY CANAL: Clear. No bony erosion. Scutum is intact. MIDDLE EAR CAVITY: Clear. Ossicular chain is intact. MASTOID AIR CELLS: Clear. INNER EAR: The cochlea and vestibule are unremarkable. Normal mineralization of the otic capsule. Normal semicircular canals. The vestibular aqueduct is not dilated. INTERNAL AUDITORY CANAL: Unremarkable. Normal bony canal of the facial nerve. LEFT TEMPORAL BONE: EXTERNAL AUDITORY CANAL: Opacified. The tympanic membrane is not excludedly visualized. MIDDLE EAR CAVITY: Opacified. Ossicles are intact. No focal osseous erosions are present. Fluid or soft tissue extend into the epitympanum. MASTOID AIR CELLS: Effusion is present without coalescence. INNER EAR: The cochlea and vestibule are unremarkable. Normal mineralization of the otic capsule.  Normal semicircular canals. The vestibular aqueduct is not dilated. INTERNAL AUDITORY CANAL: Unremarkable. Normal bony canal of the facial nerve. VASCULAR: Atherosclerotic calcifications are present in the cavernous carotid arteries bilaterally. No hyperdense vessel is present. BRAIN: Unremarkable. ORBITS: No acute abnormality. SINUSES: Right maxillary sinus is chronically opacified. Mild mucosal thickening is present within the anterior right ethmoid air cells. IMPRESSION: 1. Opacification of the left external auditory canal and middle ear cavity with extension into the epitympanum. Effusion in the left mastoid air cells without coalescence. No osseous erosion. 2. Chronically opacified right maxillary sinus and mild mucosal thickening in the anterior right ethmoid air cells. Electronically signed by: Lonni Necessary MD 08/21/2024 04:19 PM EDT RP Workstation: [id number]   CT CHEST ABDOMEN PELVIS WO CONTRAST Result Date: 08/21/2024 CLINICAL DATA:  Epigastric pain EXAM: CT CHEST, ABDOMEN AND PELVIS WITHOUT CONTRAST TECHNIQUE: Multidetector CT imaging of the chest, abdomen and pelvis was performed following the standard protocol without IV contrast. RADIATION DOSE REDUCTION: This exam was performed according to the departmental dose-optimization program which includes automated exposure control, adjustment of the mA and/or kV according to patient size and/or use of iterative reconstruction technique. COMPARISON:  None Available. FINDINGS: CT CHEST FINDINGS Cardiovascular: Aortic atherosclerosis. Cardiomegaly. Extensive three-vessel coronary artery calcifications and or stents. No pericardial effusion. Mediastinum/Nodes: No enlarged mediastinal, hilar, or axillary lymph nodes. Thyroid  gland, trachea, and esophagus demonstrate no significant findings. Lungs/Pleura: Dependent bibasilar scarring or atelectasis. No pleural effusion or pneumothorax. Musculoskeletal: No chest wall abnormality. No acute osseous  findings. CT ABDOMEN PELVIS FINDINGS Hepatobiliary: No solid liver abnormality is seen. Tiny gallstones and/or gravel (series 3, image 66). No gallbladder wall thickening, or biliary dilatation. Pancreas: Unremarkable. No pancreatic ductal dilatation or surrounding inflammatory changes. Spleen: Normal in size without significant abnormality. Adrenals/Urinary Tract: Adrenal glands are unremarkable. Small nonobstructive bilateral renal calculi and or renal vascular calcifications. No ureteral calculi or hydronephrosis. Numerous phleboliths throughout the low pelvis, which are  not favored to reflect urinary tract calculi. Bladder is unremarkable. Stomach/Bowel: Stomach is within normal limits. Appendix not clearly visualized. No evidence of bowel wall thickening, distention, or inflammatory changes. Sigmoid diverticulosis. Vascular/Lymphatic: No significant vascular findings are present. No enlarged abdominal or pelvic lymph nodes. Reproductive: No mass or other abnormality. Other: No abdominal wall hernia or abnormality. No ascites. Musculoskeletal: No acute osseous findings. IMPRESSION: 1. No acute noncontrast CT findings of the chest, abdomen, or pelvis to explain epigastric pain. 2. Tiny gallstones and/or gravel. No CT evidence of acute cholecystitis. 3. Small nonobstructive bilateral renal calculi and or renal vascular calcifications. No ureteral calculi or hydronephrosis. Numerous phleboliths throughout the pelvis which are not favored to reflect urinary tract calculi. 4. Sigmoid diverticulosis without evidence of acute diverticulitis. 5. Cardiomegaly and coronary artery disease. Aortic Atherosclerosis (ICD10-I70.0). Electronically Signed   By: Marolyn JONETTA Jaksch M.D.   On: 08/21/2024 16:03           LOS: 2 days   Time spent= 35 mins    Burgess JAYSON Dare, MD [facility]  If 7PM-7AM, please contact night-coverage  08/23/2024, 1:38 PM

## 2024-08-23 NOTE — Evaluation (Signed)
 Occupational Therapy Evaluation Patient Details Name: Beth [name] MRN: [id number] DOB: Sep 01, 1946 Today's Date: 08/23/2024   History of Present Illness   Pt is a 78 y.o. F who presented 08/21/24 due to  hematemesis from [place]. Pt found to have L elbow septic arthritis and endoscopy on 9/15 with no obvious bleeding identified. Plan for possible aspiration with IR of L elbow. 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 She came from [facility] where she was using a lift with nursing care and was just starting to attempt to stand with PT but needed assist with all ADLS at bed level. Pt in this session worked on hygiene/dressing at bed level with total assist for bed mobility, total assist with peri care/LE and mod assist with UE. Patient will benefit from continued inpatient follow up therapy, <3 hours/day.  At the end of session when repositioning the pt in the bed to [facility].  She reported dizziness and BP was taken on RUE at wrist and showed 185/105 (130) while also reporting blurry vision more than her normal vision without glasses with greater in L eye than the R. Nurse was made aware. Repeat BP 184/103 (124). Nursing called Rapid.      If plan is discharge home, recommend the following:   Two people to help with walking and/or transfers;Two people to help with bathing/dressing/bathroom;Assistance with cooking/housework;Assistance with feeding;Direct supervision/assist for medications management;Direct supervision/assist for financial management;Assist for transportation;Help with stairs or ramp for entrance     Functional Status Assessment   Patient has had a recent decline in their functional status and demonstrates the ability to make significant improvements in function in a reasonable and predictable  amount of time.     Equipment Recommendations   Other (comment) (TBD)     Recommendations for Other Services         Precautions/Restrictions   Precautions Precautions: Fall Recall of Precautions/Restrictions: Impaired Precaution/Restrictions Comments: Rt knee spacer Required Braces or Orthoses: Other Brace Other Brace: R knee brace but not present at time of eval Restrictions Weight Bearing Restrictions Per Provider Order: No     Mobility Bed Mobility Overal bed mobility: Needs Assistance Bed Mobility: Rolling Rolling: Total assist, +2 for physical assistance, Used rails         General bed mobility comments: pt needing totalA to roll due to inability to generate enough force or strength to assist with movement. rolled each direction x1    Transfers                   General transfer comment: unable this session due to elevated BP, pt fatigue, pain, and limited strength      Balance                                           ADL either performed or assessed with clinical judgement   ADL Overall ADL's : Needs assistance/impaired Eating/Feeding: Minimal assistance;Sitting   Grooming: Sitting;Set up;Bed level   Upper Body Bathing: Moderate assistance;Sitting;Bed level   Lower Body Bathing: Total assistance;+2 for physical assistance;+2 for safety/equipment;Bed level   Upper Body Dressing : Moderate assistance;Bed level   Lower Body Dressing: Total assistance;+2 for physical assistance;+2 for  safety/equipment;Bed level       Toileting- Clothing Manipulation and Hygiene: Total assistance;+2 for physical assistance;+2 for safety/equipment;Bed level               Vision Baseline Vision/History: 1 Wears glasses Patient Visual Report: Blurring of vision Vision Assessment?: Wears glasses for reading;Wears glasses for driving Additional Comments: Pt in session reports blurry vision is worse than then normal and worse in L  eye.     Perception         Praxis         Pertinent Vitals/Pain Pain Assessment Pain Assessment: Faces Faces Pain Scale: Hurts even more Pain Location: R knee with ROM, L elbow Pain Descriptors / Indicators: Aching, Discomfort Pain Intervention(s): Limited activity within patient's tolerance, Monitored during session, Premedicated before session     Extremity/Trunk Assessment Upper Extremity Assessment Upper Extremity Assessment: LUE deficits/detail LUE Deficits / Details: Pt in splint at time of eval and only able to move at shoulder and digits. Pt unable to make a full fist and reported some numbness in hand. LUE Sensation: decreased light touch LUE Coordination: decreased fine motor;decreased gross motor   Lower Extremity Assessment Lower Extremity Assessment: Generalized weakness;RLE deficits/detail;LLE deficits/detail RLE Deficits / Details: limited ROM at knee due to pain, tolerated minimal movement, maintains in slight knee flexion. poor quad activation and grossly 3+/4 to MMT other than at ankle, 4/5 to MMT. RLE: Unable to fully assess due to pain RLE Sensation: WNL RLE Coordination: WNL LLE Deficits / Details: pt with limited ROM to knee, but grossly 4-/5 to MMT other than ankle PF which was 4/5. Pt denies difference in sensation LLE: Unable to fully assess due to pain LLE Sensation: WNL LLE Coordination: WNL   Cervical / Trunk Assessment Cervical / Trunk Assessment: Kyphotic;Other exceptions Cervical / Trunk Exceptions: increased body habitus   Communication Communication Communication: Impaired Factors Affecting Communication: Reduced clarity of speech (started after infection of L ear)   Cognition Arousal: Alert Behavior During Therapy: WFL for tasks assessed/performed Cognition: No apparent impairments (Pt not always clear on the exact detials of information.)                               Following commands: Intact Following commands  impaired: Follows one step commands with increased time     Cueing  General Comments   Cueing Techniques: Verbal cues  BP 185/105 (130) after session, pt reporting increased blurry vision, especially in Left eye. RN alerted, and she called rapid. Repeat BP 184/103 (124)   Exercises     Shoulder Instructions      Home Living Family/patient expects to be discharged to:: Skilled nursing facility Living Arrangements: Alone Available Help at Discharge: Family;Personal care attendant;Available PRN/intermittently Type of Home: House Home Access: Ramped entrance     Home Layout: One level     Bathroom Shower/Tub: Producer, television/film/video: Standard     Home Equipment: Agricultural consultant (2 wheels);Cane - single point;Wheelchair - manual;BSC/3in1   Additional Comments: pr from SNF and will need to return      Prior Functioning/Environment Prior Level of Function : Needs assist             Mobility Comments: Working on standing with PT, reports she didn't have a brace for a little while at rehab, largely using lift to [facility] recently ADLs Comments: bedbound for ADLs, assist from staff for all care  OT Problem List: Decreased strength;Decreased range of motion;Decreased activity tolerance;Impaired balance (sitting and/or standing);Impaired vision/perception;Decreased safety awareness;Decreased knowledge of use of DME or AE;Pain;Impaired UE functional use   OT Treatment/Interventions: Self-care/ADL training;Therapeutic exercise;Energy conservation;DME and/or AE instruction;Therapeutic activities;Balance training;Patient/family education      OT Goals(Current goals can be found in the care plan section)   Acute Rehab OT Goals Patient Stated Goal: to go back to rehab OT Goal Formulation: With patient Time For Goal Achievement: 09/06/24 Potential to Achieve Goals: Fair   OT Frequency:  Min 2X/week    Co-evaluation PT/OT/SLP Co-Evaluation/Treatment: Yes Reason for  Co-Treatment: Complexity of the patient's impairments (multi-system involvement);For patient/therapist safety;To address functional/ADL transfers PT goals addressed during session: Mobility/safety with mobility;Proper use of DME;Strengthening/ROM OT goals addressed during session: ADL's and self-care      AM-PAC OT 6 Clicks Daily Activity     Outcome Measure Help from another person eating meals?: None Help from another person taking care of personal grooming?: A Little Help from another person toileting, which includes using toliet, bedpan, or urinal?: Total Help from another person bathing (including washing, rinsing, drying)?: Total Help from another person to put on and taking off regular upper body clothing?: A Lot Help from another person to put on and taking off regular lower body clothing?: Total 6 Click Score: 12   End of Session Nurse Communication: Mobility status  Activity Tolerance: Other (comment) (bp) Patient left: in bed;with call bell/phone within reach;with bed alarm set  OT Visit Diagnosis: Unsteadiness on feet (R26.81);Other abnormalities of gait and mobility (R26.89);Muscle weakness (generalized) (M62.81);Pain Pain - Right/Left: Left Pain - part of body: Shoulder                Time: 8974-8895 OT Time Calculation (min): 39 min Charges:  OT General Charges $OT Visit: 1 Visit OT Evaluation $OT Eval Moderate Complexity: 1 Mod  Juvon Teater K OTR/L  Acute Rehab Services  [phone] office number   Warrick Berber 08/23/2024, 12:14 PM

## 2024-08-23 NOTE — Evaluation (Signed)
 Physical Therapy Evaluation Patient Details Name: Beth [name] MRN: [id number] DOB: 06/19/1946 Today's Date: 08/23/2024  History of Present Illness  Pt is a 78 y.o. F who presented 08/21/24 due to  hematemesis from [place]. Pt found to have L elbow septic arthritis and endoscopy on 9/15 with no obvious bleeding identified. Plan for possible aspiration with IR of L elbow. 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   Clinical Impression  Pt in bed upon arrival of PT, agreeable to evaluation at this time. Prior to admission the pt reports she was using lift with staff at [facility], is total care for ADLs, and working with PT on standing tolerance. The pt required totalA to complete rolling in bed this session due to significant pain in BLE (RLE > LLE) and LUE with rolling and any ROM. Pt will continue to benefit from skilled PT acutely and return to post-acute rehab <3hours/day.   While repositioning pt at end of session, BP was run and showed 185/105 (130) with pt reporting increased blurry vision (worse in L eye than R eye). Pt continued to demo similar strength in extremities as earlier in session, and was able to visually track and follow all commands. RN notified, present in room assessing pt as BP re-cycled and was still 184/103 (124). HR 87bpm.       If plan is discharge home, recommend the following: A lot of help with bathing/dressing/bathroom;Assistance with feeding;Assist for transportation;Direct supervision/assist for financial management;Assistance with cooking/housework;Direct supervision/assist for medications management;Supervision due to cognitive status;Two people to help with walking and/or transfers   Can travel by private vehicle   No    Equipment Recommendations None recommended by PT  Recommendations for Other Services       Functional Status Assessment Patient has had a  recent decline in their functional status and/or demonstrates limited ability to make significant improvements in function in a reasonable and predictable amount of time     Precautions / Restrictions Precautions Precautions: Fall Recall of Precautions/Restrictions: Impaired Precaution/Restrictions Comments: Rt knee spacer Required Braces or Orthoses: Other Brace Other Brace: R knee brace but not present at time of eval Restrictions Weight Bearing Restrictions Per Provider Order: No      Mobility  Bed Mobility Overal bed mobility: Needs Assistance Bed Mobility: Rolling Rolling: Total assist, +2 for physical assistance, Used rails         General bed mobility comments: pt needing totalA to roll due to inability to generate enough force or strength to assist with movement. rolled each direction x1    Transfers                   General transfer comment: unable this session due to elevated BP, pt fatigue, pain, and limited strength    Ambulation/Gait                  Stairs            Wheelchair Mobility     Tilt Bed    Modified Rankin (Stroke Patients Only)       Balance                                             Pertinent Vitals/Pain Pain Assessment Pain Assessment: Faces Faces Pain  Scale: Hurts even more Pain Location: R knee with ROM, L elbow Pain Descriptors / Indicators: Aching, Discomfort Pain Intervention(s): Limited activity within patient's tolerance, Monitored during session, Repositioned    Home Living Family/patient expects to be discharged to:: Skilled nursing facility Living Arrangements: Alone                 Additional Comments: pr from SNF and will need to return    Prior Function Prior Level of Function : Needs assist             Mobility Comments: Working on standing with PT, reports she didn't have a brace for a little while at rehab, largely using lift to [facility] recently ADLs  Comments: bedbound for ADLs, assist from staff for all care     Extremity/Trunk Assessment   Upper Extremity Assessment Upper Extremity Assessment: Defer to OT evaluation;LUE deficits/detail    Lower Extremity Assessment Lower Extremity Assessment: Generalized weakness;RLE deficits/detail;LLE deficits/detail RLE Deficits / Details: limited ROM at knee due to pain, tolerated minimal movement, maintains in slight knee flexion. poor quad activation and grossly 3+/4 to MMT other than at ankle, 4/5 to MMT. RLE: Unable to fully assess due to pain RLE Sensation: WNL RLE Coordination: WNL LLE Deficits / Details: pt with limited ROM to knee, but grossly 4-/5 to MMT other than ankle PF which was 4/5. Pt denies difference in sensation LLE: Unable to fully assess due to pain LLE Sensation: WNL LLE Coordination: WNL    Cervical / Trunk Assessment Cervical / Trunk Assessment: Kyphotic;Other exceptions Cervical / Trunk Exceptions: increased body habitus  Communication   Communication Communication: No apparent difficulties    Cognition Arousal: Alert Behavior During Therapy: WFL for tasks assessed/performed   PT - Cognitive impairments: No apparent impairments                       PT - Cognition Comments: pt following commands in session, not formally assessed Following commands: Intact Following commands impaired: Follows one step commands with increased time     Cueing Cueing Techniques: Verbal cues     General Comments General comments (skin integrity, edema, etc.): BP 185/105 (130) after session, pt reporting increased blurry vision, especially in Left eye. RN alerted, and she called rapid. Repeat BP 184/103 (124)    Exercises General Exercises - Lower Extremity Ankle Circles/Pumps: AROM, Both, 5 reps, Supine Quad Sets: AROM, Left, 5 reps Heel Slides: AAROM, Both, 5 reps, Supine Hip ABduction/ADduction: AAROM, Both, 5 reps, Supine   Assessment/Plan    PT  Assessment Patient needs continued PT services  PT Problem List Decreased strength;Decreased activity tolerance;Decreased balance;Decreased range of motion;Decreased cognition;Decreased coordination;Decreased safety awareness;Decreased mobility;Obesity       PT Treatment Interventions Gait training;DME instruction;Balance training;Neuromuscular re-education;Cognitive remediation;Therapeutic activities;Functional mobility training;Therapeutic exercise;Patient/family education    PT Goals (Current goals can be found in the Care Plan section)  Acute Rehab PT Goals Patient Stated Goal: get stronger PT Goal Formulation: With patient Time For Goal Achievement: 09/06/24 Potential to Achieve Goals: Fair    Frequency Min 2X/week     Co-evaluation PT/OT/SLP Co-Evaluation/Treatment: Yes Reason for Co-Treatment: Complexity of the patient's impairments (multi-system involvement);For patient/therapist safety;To address functional/ADL transfers PT goals addressed during session: Mobility/safety with mobility;Proper use of DME;Strengthening/ROM         AM-PAC PT 6 Clicks Mobility  Outcome Measure Help needed turning from your back to your side while in a flat bed without using bedrails?: Total Help needed  moving from lying on your back to sitting on the side of a flat bed without using bedrails?: Total Help needed moving to and from a bed to a chair (including a wheelchair)?: Total Help needed standing up from a chair using your arms (e.g., wheelchair or bedside chair)?: Total Help needed to walk in hospital room?: Total Help needed climbing 3-5 steps with a railing? : Total 6 Click Score: 6    End of Session   Activity Tolerance: Patient limited by fatigue;Other (comment) (elevated BP) Patient left: in bed;with call bell/phone within reach;with bed alarm set;with nursing/sitter in room Nurse Communication: Mobility status;Need for lift equipment;Other (comment) (BP) PT Visit Diagnosis:  Other abnormalities of gait and mobility (R26.89);Difficulty in walking, not elsewhere classified (R26.2);Muscle weakness (generalized) (M62.81)    Time: 8974-8895 PT Time Calculation (min) (ACUTE ONLY): 39 min   Charges:   PT Evaluation $PT Eval Moderate Complexity: 1 Mod PT Treatments $Therapeutic Activity: 8-22 mins PT General Charges $$ ACUTE PT VISIT: 1 Visit         Izetta Call, PT, DPT   Acute Rehabilitation Department Office [phone] Secure Chat Communication Preferred  Izetta JULIANNA Call 08/23/2024, 11:28 AM

## 2024-08-23 NOTE — TOC Initial Note (Addendum)
 Transition of Care [facility]) - Initial/Assessment Note    Patient Details  Name: Beth [name] MRN: [id number] Date of Birth: 05-Dec-1946  Transition of Care [facility]) CM/SW Contact:    Isaiah Public, LCSWA Phone Number: 08/23/2024, 1:35 PM  Clinical Narrative:                  CSW received consult for possible SNF placement at time of discharge. CSW spoke with patient at bedside regarding PT recommendation of SNF placement at time of discharge. Patient reports PTA she comes from [facility] short term.Patient expressed understanding of PT recommendation and is agreeable to SNF placement at time of discharge. Patient would like to return to [facility] for short term rehab. CSW discussed insurance authorization process.Patient expressed being hopeful for rehab and to feel better soon. No further questions reported at this time. Star with [name] informed CSW to let her know closer to patient being medically ready to check on status of bed availability.  CSW following to start insurance authorization closer to patient being medically ready.CSW to continue to follow and assist with discharge planning needs.   Expected Discharge Plan: Skilled Nursing Facility Barriers to Discharge: Continued Medical Work up   Patient Goals and CMS Choice Patient states their goals for this hospitalization and ongoing recovery are:: to return to rehab   Choice offered to / list presented to : Patient      Expected Discharge Plan and Services                                              Prior Living Arrangements/Services     Patient language and need for interpreter reviewed:: Yes Do you feel safe going back to the place where you live?: No   SNF  Need for Family Participation in Patient Care: Yes (Comment) Care giver support system in place?: Yes (comment)   Criminal Activity/Legal Involvement Pertinent to Current Situation/Hospitalization: No - Comment as needed  Activities of Daily  Living   ADL Screening (condition at time of admission) Independently performs ADLs?: No Does the patient have a NEW difficulty with bathing/dressing/toileting/self-feeding that is expected to last >3 days?: No Does the patient have a NEW difficulty with getting in/out of bed, walking, or climbing stairs that is expected to last >3 days?: No Does the patient have a NEW difficulty with communication that is expected to last >3 days?: No Is the patient deaf or have difficulty hearing?: No Does the patient have difficulty seeing, even when wearing glasses/contacts?: No Does the patient have difficulty concentrating, remembering, or making decisions?: No  Permission Sought/Granted Permission sought to share information with : Case Manager, Magazine features editor, Family Supports Permission granted to share information with : Yes, Verbal Permission Granted  Share Information with NAME: [name]  Permission granted to share info w AGENCY: SNF  Permission granted to share info w Relationship: daughter  Permission granted to share info w Contact Information: [name] [phone]  Emotional Assessment Appearance:: Appears stated age Attitude/Demeanor/Rapport: Gracious Affect (typically observed): Calm Orientation: : Oriented to Self, Oriented to Place, Oriented to  Time, Oriented to Situation Alcohol / Substance Use: Not Applicable Psych Involvement: No (comment)  Admission diagnosis:  Left bundle branch block [I44.7] Upper GI bleed [K92.2] Anticoagulated [Z79.01] Cellulitis of left upper extremity [L03.114] Chronic pain of right knee [M25.561, G89.29] Acute pain of right  knee [M25.561] Hematemesis with nausea [K92.0] Patient Active Problem List   Diagnosis Date Noted   Anticoagulated 08/22/2024   Hematemesis with nausea 08/21/2024   Septic joint of left elbow (HCC) 08/21/2024   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 08/15/2024   History of pulmonary embolism 08/15/2024   Obesity (BMI  30-39.9) 08/03/2024   Acute kidney injury superimposed on chronic kidney disease (HCC) 08/01/2024   Prolonged QT interval 08/01/2024   Elevated troponin 08/01/2024   Hypertensive urgency 08/01/2024   Acute metabolic encephalopathy 08/01/2024   Otitis externa 08/01/2024   Heart failure with preserved ejection fraction (HCC) 08/01/2024   Sore throat 08/01/2024   Debility 08/01/2024   CKD (chronic kidney disease), stage IV (HCC) 07/25/2024   Hospital discharge follow-up 07/25/2024   Stage 2 chronic kidney disease 07/25/2024   Uncontrolled hypertension 07/25/2024   Neck pain, musculoskeletal 07/25/2024   Acute suppurative otitis media of left ear without spontaneous rupture of tympanic membrane 07/25/2024   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 12/31/2023   CHF (congestive heart failure) (HCC) 08/20/2023   Murmur 09/24/2021   Hamstring tendinitis of right thigh 08/22/2019   Osteoarthritis of both sacroiliac joints (HCC) 08/22/2019   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 [place] [address] Phone: [phone] Fax: [phone]  [facility] [address], [place] [address] [place] [address] Phone: [phone] Fax: [phone]  [facility] - [place], [place] - [address] [place] [address] Phone: [phone] Fax: [phone]  [facility] [place], [place] - [address] [place] [place] [address] Phone: [phone] Fax: [phone]     Social  Drivers of Health (SDOH) Social History: SDOH Screenings   Food Insecurity: No Food Insecurity (08/22/2024)  Housing: Low Risk  (08/22/2024)  Transportation Needs: No Transportation Needs (08/22/2024)  Recent Concern: Transportation Needs - Unmet Transportation Needs (07/20/2024)   Received from [facility]  Utilities: Not At Risk (08/22/2024)  Alcohol Screen: Low Risk  (12/30/2023)  Depression (PHQ2-9): Medium Risk (07/20/2024)  Financial Resource Strain: Low Risk  (07/20/2024)   Received from [facility]  Physical Activity: Inactive (07/20/2024)   Received from [facility]  Social Connections: Moderately Integrated (08/22/2024)  Recent Concern: Social Connections - Socially Isolated (07/20/2024)   Received from [facility]  Stress: No Stress Concern Present (07/20/2024)   Received from [facility]  Tobacco Use: Low Risk  (08/22/2024)  Health Literacy: Low Risk  (07/20/2024)   Received from [facility]   SDOH Interventions:     Readmission Risk Interventions     No data to display

## 2024-08-23 NOTE — NC FL2 (Signed)
 [place]  MEDICAID FL2 LEVEL OF CARE FORM     IDENTIFICATION  Patient Name: Beth [name] Birthdate: January 05, 1946 Sex: female Admission Date (Current Location): 08/21/2024  [facility] and [place] Number:  Producer, television/film/video and Address:  [facility], 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 08/15/2024   History of pulmonary embolism 08/15/2024   Obesity (BMI 30-39.9) 08/03/2024   Acute kidney injury superimposed on chronic kidney disease (HCC) 08/01/2024   Prolonged QT interval 08/01/2024   Elevated troponin 08/01/2024   Hypertensive urgency 08/01/2024   Acute metabolic encephalopathy 08/01/2024   Otitis externa 08/01/2024   Heart failure with preserved ejection fraction (HCC) 08/01/2024   Sore throat 08/01/2024   Debility 08/01/2024   CKD (chronic kidney disease), stage IV (HCC) 07/25/2024   Hospital discharge follow-up 07/25/2024   Stage 2 chronic kidney disease 07/25/2024   Uncontrolled hypertension 07/25/2024   Neck pain, musculoskeletal 07/25/2024   Acute suppurative otitis media of left ear without spontaneous rupture of tympanic membrane 07/25/2024   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 12/31/2023   CHF (congestive heart failure) (HCC) 08/20/2023   Murmur 09/24/2021   Hamstring tendinitis of right thigh 08/22/2019   Osteoarthritis of both  sacroiliac joints (HCC) 08/22/2019   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/06/2018   Diabetes type 2, controlled (HCC) 03/07/2016   Gout    Essential hypertension    Enuresis 11/19/2015   HLD (hyperlipidemia) 01/04/2014   Hypokalemia 01/04/2014   Anemia, iron  deficiency 12/30/2011   Constipation 04/04/2009   TUBULOVILLOUS ADENOMA, COLON 04/03/2009    Orientation RESPIRATION BLADDER Height & Weight     Self, Time, Situation, Place  Normal Incontinent Weight: 218 lb 14.7 oz (99.3 kg) Height:  5' 5 (165.1 cm)  BEHAVIORAL SYMPTOMS/MOOD NEUROLOGICAL BOWEL NUTRITION STATUS        Diet (Please see discharge summary)  AMBULATORY STATUS COMMUNICATION OF NEEDS Skin   Extensive Assist Verbally Other (Comment) (WDL,Wound/Incision LDAs)                       Personal Care Assistance Level of Assistance  Bathing, Feeding, Dressing Bathing Assistance: Maximum assistance Feeding assistance: Limited assistance Dressing Assistance: Maximum assistance     Functional Limitations Info  Hearing, Sight Sight Info: Impaired Hearing Info: Impaired Speech Info: Adequate    SPECIAL CARE FACTORS FREQUENCY  PT (By licensed PT), OT (By licensed OT)     PT Frequency: 5x min weekly OT Frequency: 5x min weekly            Contractures Contractures Info: Not present    Additional Factors Info  Code Status, Allergies, Insulin  Sliding Scale Code Status Info: FULL Allergies Info: Cefdinir,Aleve (naproxen Sodium),Asa (aspirin ),Ciprofloxacin ,Clonidine  Derivatives,Shellfish Allergy   Insulin  Sliding Scale Info: please see dc summary info       Current Medications (08/23/2024):  This is the current hospital active medication list Current Facility-Administered Medications  Medication Dose Route Frequency Provider Last Rate Last Admin   acetaminophen  (TYLENOL ) tablet 650 mg  650 mg Oral Q6H PRN Doutova, Anastassia, MD       Or   acetaminophen  (TYLENOL ) suppository 650 mg  650 mg  Rectal Q6H PRN Doutova, Anastassia, MD       apixaban  (ELIQUIS ) tablet 5 mg  5 mg Oral BID Dodson, Andrew J, RPH   5 mg at 08/23/24 0900   aspirin  EC tablet 81 mg  81 mg Oral Daily Amin, Ankit C, MD       atorvastatin  (LIPITOR ) tablet 40 mg  40 mg Oral QHS Doutova, Anastassia, MD   40 mg at 08/22/24 2109   azelastine  (ASTELIN ) 0.1 % nasal spray 1 spray  1 spray Each Nare BID Amin, Ankit C, MD       ceFEPIme  (MAXIPIME ) 2 g in sodium chloride  0.9 % 100 mL IVPB  2 g Intravenous Q24H Gaines Carrier, RPH   Stopped at 08/22/24 2200   ciprofloxacin -dexamethasone  (CIPRODEX ) 0.3-0.1 % OTIC (EAR) suspension 4 drop  4 drop Left EAR BID Doutova, Anastassia, MD   4 drop at 08/23/24 1003   diltiazem  (CARDIZEM  CD) 24 hr capsule 240 mg  240 mg Oral Daily Doutova, Anastassia, MD   240 mg at 08/23/24 0900   famotidine  (PEPCID ) tablet 20 mg  20 mg Oral Daily Cunningham, Scott E, MD   20 mg at 08/23/24 0901   fentaNYL  (SUBLIMAZE ) injection 12.5-50 mcg  12.5-50 mcg Intravenous Q2H PRN Doutova, Anastassia, MD       [START ON 08/25/2024] ferrous sulfate  tablet 325 mg  325 mg Oral Once per day on Monday Thursday Amin, Ankit C, MD       hydrALAZINE  (APRESOLINE ) injection 10 mg  10 mg Intravenous Q4H PRN Doutova, Anastassia, MD   10 mg at 08/23/24 1114   hydrALAZINE  (APRESOLINE ) tablet 25 mg  25 mg Oral Q8H Amin, Ankit C, MD       HYDROcodone -acetaminophen  (NORCO/VICODIN) 5-325 MG per tablet 1-2 tablet  1-2 tablet Oral Q4H PRN Doutova, Anastassia, MD   2 tablet at 08/22/24 2109   insulin  aspart (novoLOG ) injection 0-9 Units  0-9 Units Subcutaneous Q4H Doutova, Anastassia, MD   1 Units at 08/23/24 0900   ipratropium-albuterol  (DUONEB) 0.5-2.5 (3) MG/3ML nebulizer solution 3 mL  3 mL Nebulization Q4H PRN Amin, Ankit C, MD       isosorbide  mononitrate (IMDUR ) 24 hr tablet 60 mg  60 mg Oral Daily Amin, Ankit C, MD       labetalol  (NORMODYNE ) injection 5 mg  5 mg Intravenous Q2H PRN Doutova, Anastassia, MD   5 mg at 08/22/24 0055    metroNIDAZOLE  (FLAGYL ) tablet 500 mg  500 mg Oral Q12H Singh, Mayanka, MD   500 mg at 08/23/24 1238   pantoprazole  (PROTONIX ) EC tablet 40 mg  40 mg Oral QAC breakfast Amin, Ankit C, MD   40 mg at 08/23/24 0900   valACYclovir  (VALTREX ) tablet 500 mg  500 mg Oral BID Doutova, Anastassia, MD   500 mg at 08/23/24 0900   vancomycin  (VANCOCIN ) IVPB 1000 mg/200 mL premix  1,000 mg Intravenous Q48H Oriet, Jonathan, [facility]         Discharge Medications: Please see discharge summary for a list of discharge medications.  Relevant Imaging Results:  Relevant Lab Results:   Additional Information [id number]  Isaiah Public, LCSWA

## 2024-08-24 ENCOUNTER — Encounter (INDEPENDENT_AMBULATORY_CARE_PROVIDER_SITE_OTHER)

## 2024-08-24 DIAGNOSIS — M00822 Arthritis due to other bacteria, left elbow: Secondary | ICD-10-CM | POA: Diagnosis not present

## 2024-08-24 DIAGNOSIS — H6590 Unspecified nonsuppurative otitis media, unspecified ear: Secondary | ICD-10-CM

## 2024-08-24 DIAGNOSIS — K920 Hematemesis: Secondary | ICD-10-CM | POA: Diagnosis not present

## 2024-08-24 DIAGNOSIS — Z87898 Personal history of other specified conditions: Secondary | ICD-10-CM | POA: Diagnosis not present

## 2024-08-24 LAB — GLUCOSE, CAPILLARY
Glucose-Capillary: 143 mg/dL — ABNORMAL HIGH (ref 70–99)
Glucose-Capillary: 102 mg/dL — ABNORMAL HIGH (ref 70–99)
Glucose-Capillary: 110 mg/dL — ABNORMAL HIGH (ref 70–99)
Glucose-Capillary: 135 mg/dL — ABNORMAL HIGH (ref 70–99)
Glucose-Capillary: 98 mg/dL (ref 70–99)
Glucose-Capillary: 151 mg/dL — ABNORMAL HIGH (ref 70–99)
Glucose-Capillary: 143 mg/dL — ABNORMAL HIGH (ref 70–99)

## 2024-08-24 LAB — CBC
HCT: 29.7 % — ABNORMAL LOW (ref 36.0–46.0)
Hemoglobin: 9.2 g/dL — ABNORMAL LOW (ref 12.0–15.0)
MCH: 26.4 pg (ref 26.0–34.0)
MCHC: 31 g/dL (ref 30.0–36.0)
MCV: 85.1 fL (ref 80.0–100.0)
Platelets: 365 K/uL (ref 150–400)
RBC: 3.49 MIL/uL — ABNORMAL LOW (ref 3.87–5.11)
RDW: 16.2 % — ABNORMAL HIGH (ref 11.5–15.5)
WBC: 5.2 K/uL (ref 4.0–10.5)
nRBC: 0 % (ref 0.0–0.2)

## 2024-08-24 LAB — BASIC METABOLIC PANEL WITH GFR
Anion gap: 17 — ABNORMAL HIGH (ref 5–15)
BUN: 29 mg/dL — ABNORMAL HIGH (ref 8–23)
CO2: 16 mmol/L — ABNORMAL LOW (ref 22–32)
Calcium: 8.3 mg/dL — ABNORMAL LOW (ref 8.9–10.3)
Chloride: 104 mmol/L (ref 98–111)
Creatinine, Ser: 1.98 mg/dL — ABNORMAL HIGH (ref 0.44–1.00)
GFR, Estimated: 25 mL/min — ABNORMAL LOW (ref >60)
Glucose, Bld: 105 mg/dL — ABNORMAL HIGH (ref 70–99)
Potassium: 4 mmol/L (ref 3.5–5.1)
Sodium: 137 mmol/L (ref 135–145)

## 2024-08-24 LAB — MAGNESIUM: Magnesium: 2 mg/dL (ref 1.7–2.4)

## 2024-08-24 LAB — SURGICAL PATHOLOGY

## 2024-08-24 MED ORDER — SODIUM BICARBONATE 650 MG PO TABS
650.0000 mg | ORAL_TABLET | Freq: Two times a day (BID) | ORAL | Status: AC
  Administered 2024-08-24 – 2024-08-28 (×10): 650 mg via ORAL
  Filled 2024-08-24 (×11): qty 1

## 2024-08-24 NOTE — TOC Progression Note (Signed)
 Transition of Care [facility]) - Progression Note    Patient Details  Name: Beth [name] MRN: [id number] Date of Birth: 01/26/1946  Transition of Care [facility]) CM/SW Contact  Isaiah Public, LCSWA Phone Number: 08/24/2024, 10:23 AM  Clinical Narrative:     Patient has SN[facility] bed at [facility]. CSW following to start insurance authorization closer to patient being medically ready for dc.   Expected Discharge Plan: Skilled Nursing Facility Barriers to Discharge: Continued Medical Work up               Expected Discharge Plan and Services                                               Social Drivers of Health (SDOH) Interventions SDOH Screenings   Food Insecurity: No Food Insecurity (08/22/2024)  Housing: Low Risk  (08/22/2024)  Transportation Needs: No Transportation Needs (08/22/2024)  Recent Concern: Transportation Needs - Unmet Transportation Needs (07/20/2024)   Received from [facility]  Utilities: Not At Risk (08/22/2024)  Alcohol Screen: Low Risk  (12/30/2023)  Depression (PHQ2-9): Medium Risk (07/20/2024)  Financial Resource Strain: Low Risk  (07/20/2024)   Received [facility]  Physical Activity: Inactive (07/20/2024)   Received from [facility]  Social Connections: Moderately Integrated (08/22/2024)  Recent Concern: Social Connections - Socially Isolated (07/20/2024)   Received from [facility]  Stress: No Stress Concern Present (07/20/2024)   Received from [facility]  Tobacco Use: Low Risk  (08/22/2024)  Health Literacy: Low Risk  (07/20/2024)   Received from [facility]    Readmission Risk Interventions     No data to display

## 2024-08-24 NOTE — Progress Notes (Signed)
 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   Beth [name] is s/p left elbow aspiration with cultures showing 95% neutrophils. WBC count unable to be determined secondary to clotting of specimen and no crystals seen. Synovial culture without growth to date.  Previously against surgical debridement given previous troubles with right knee. Discussed the ideal treatment would be for surgical debridement followed by 4 weeks of antibiotics as antibiotics independently are not likely to cure this infection. Recommend further surgical evaluation to consider debridement. Continue broad spectrum coverage with vancomycin , cefepime  and metronidazole . Monitor culture for any organisms and adjust antibiotics accordingly. Remaining medical and supportive care per Internal Medicine.   PLAN:  Continue current dose of vancomycin , cefepime  and metronidazole . Monitor culture for organisms and narrow antibiotics as able.  Orthopedics evaluation to reconsider surgical debridement as antibiotics are not likely to cure independently.  Therapeutic drug monitoring of renal function and vancomycin  levels.  Standard/universal precautions. Remaining medical and supportive care per Internal Medicine.    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   History of pulmonary embolism   Septic joint of left elbow (HCC)   Anticoagulated    apixaban   5 mg Oral BID   aspirin  EC  81 mg Oral Daily   atorvastatin   40 mg Oral QHS   azelastine   1 spray Each Nare BID   ciprofloxacin -dexamethasone   4 drop Left EAR BID   diltiazem   240 mg Oral Daily   famotidine   20 mg Oral Daily   [START ON 08/25/2024] ferrous sulfate   325 mg Oral Once per day on Monday Thursday   hydrALAZINE   25 mg Oral Q8H   insulin  aspart  0-9 Units Subcutaneous Q4H   isosorbide  mononitrate  60 mg Oral Daily   metroNIDAZOLE   500 mg Oral Q12H   pantoprazole   40 mg Oral QAC breakfast   sodium bicarbonate   650 mg Oral BID   valACYclovir   500 mg Oral BID    SUBJECTIVE:  Afebrile overnight with no acute events. Tolerating antibiotics with no adverse side effects.   Allergies  Allergen Reactions   Cefdinir Swelling and Rash    Tolerated cephalosporins many times in the past   Aleve [Naproxen Sodium] Other (See Comments)    Heart races   Dorethia Laos ] Other (See Comments)    Nose bleeding   Ciprofloxacin  Other (See Comments)    Possible hamstring tendinopathy   Clonidine  Derivatives Other (See Comments)    Dizziness and weakness   Shellfish Allergy Nausea And Vomiting  Review of Systems: Review of Systems  Constitutional:  Negative for chills, fever and weight loss.  Respiratory:  Negative for cough, shortness of breath and wheezing.   Cardiovascular:  Negative for chest pain and leg swelling.  Gastrointestinal:  Negative for abdominal pain, constipation, diarrhea, nausea and vomiting.  Skin:  Negative for rash.      OBJECTIVE: Vitals:   08/24/24 0453 08/24/24 0719 08/24/24 0850 08/24/24 1115  BP: (!) 167/93 (!) 171/93 (!)  171/93 (!) 164/89  Pulse: 79 85    Resp: 15   17  Temp: 97.9 F (36.6 C) 98.8 F (37.1 C)  98.4 F (36.9 C)  TempSrc: Oral Oral  Oral  SpO2: 98% 99%    Weight:      Height:       Body mass index is 36.43 kg/m.  Physical Exam Constitutional:      General: Beth [name] is not in acute distress.    Appearance: Beth [name] is well-developed.  Cardiovascular:     Rate and Rhythm: Normal rate and regular rhythm.     Heart sounds: Normal heart sounds.  Pulmonary:     Effort: Pulmonary effort is normal.     Breath sounds: Normal breath sounds.  Skin:    General: Skin is warm and dry.  Neurological:     Mental Status: Beth [name] is alert.     Lab Results Lab Results  Component Value Date   WBC 5.2 08/24/2024   HGB 9.2 (L) 08/24/2024   HCT 29.7 (L) 08/24/2024   MCV 85.1 08/24/2024   PLT 365 08/24/2024    Lab Results  Component Value Date   CREATININE 1.98 (H) 08/24/2024   BUN 29 (H) 08/24/2024   NA 137 08/24/2024   K 4.0 08/24/2024   CL 104 08/24/2024   CO2 16 (L) 08/24/2024    Lab Results  Component Value Date   ALT 14 08/22/2024   AST 17 08/22/2024   ALKPHOS 80 08/22/2024   BILITOT 0.9 08/22/2024     Microbiology: Recent Results (from the past 240 hours)  Blood Culture (routine x 2)     Status: None   Collection Time: 08/14/24  8:43 PM   Specimen: BLOOD  Result Value Ref Range Status   Specimen Description BLOOD LEFT ANTECUBITAL  Final   Special Requests   Final    BOTTLES DRAWN AEROBIC AND ANAEROBIC Blood Culture adequate volume   Culture   Final    NO GROWTH 5 DAYS Performed at [facility], [address], [place], [place] [address]    Report Status 08/19/2024 FINAL  Final  Resp panel by RT-PCR (RSV, Flu A&B, Covid) Anterior Nasal Swab     Status: None   Collection Time: 08/14/24  8:43 PM   Specimen: Anterior Nasal Swab  Result Value Ref Range Status   SARS Coronavirus 2 by RT PCR NEGATIVE NEGATIVE Final   Influenza A by PCR NEGATIVE NEGATIVE Final   Influenza B  by PCR NEGATIVE NEGATIVE Final    Comment: (NOTE) The Xpert Xpress SARS-CoV-2/FLU/RSV plus assay is intended as an aid in the diagnosis of influenza from Nasopharyngeal swab specimens and should not be used as a sole basis for treatment. Nasal washings and aspirates are unacceptable for Xpert Xpress SARS-CoV-2/FLU/RSV testing.  Fact Sheet for Patients: BloggerCourse.com  Fact Sheet for Healthcare Providers: SeriousBroker.it  This test is not yet approved or cleared by the [place]  FDA and has been authorized for detection and/or diagnosis of SARS-CoV-2 by FDA  under an Emergency Use Authorization (EUA). This EUA will remain in effect (meaning this test can be used) for the duration of the COVID-19 declaration under Section 564(b)(1) of the Act, 21 U.S.C. section 360bbb-3(b)(1), unless the authorization is terminated or revoked.     Resp Syncytial Virus by PCR NEGATIVE NEGATIVE Final    Comment: (NOTE) Fact Sheet for Patients: BloggerCourse.com  Fact Sheet for Healthcare Providers: SeriousBroker.it  This test is not yet approved or cleared by the [place]  FDA and has been authorized for detection and/or diagnosis of SARS-CoV-2 by FDA under an Emergency Use Authorization (EUA). This EUA will remain in effect (meaning this test can be used) for the duration of the COVID-19 declaration under Section 564(b)(1) of the Act, 21 U.S.C. section 360bbb-3(b)(1), unless the authorization is terminated or revoked.  Performed at [facility], [address], [place], [place] [address]   Blood Culture (routine x 2)     Status: None   Collection Time: 08/14/24 10:30 PM   Specimen: BLOOD  Result Value Ref Range Status   Specimen Description BLOOD BLOOD LEFT HAND  Final   Special Requests   Final    BOTTLES DRAWN AEROBIC ONLY Blood Culture results may not be optimal due to an  inadequate volume of blood received in culture bottles   Culture   Final    NO GROWTH 5 DAYS Performed at [facility], [address] [place], [place] [address]    Report Status 08/19/2024 FINAL  Final  Aerobic/Anaerobic Culture w Gram Stain (surgical/deep wound)     Status: None   Collection Time: 08/15/24 12:01 PM   Specimen: Ear, Left; Abscess  Result Value Ref Range Status   Specimen Description ABSCESS  Final   Special Requests EAR,LEFT  Final   Gram Stain NO WBC SEEN NO ORGANISMS SEEN   Final   Culture   Final    No growth aerobically or anaerobically. Performed at [facility], [address] [place], [place] [address]    Report Status 08/20/2024 FINAL  Final  MRSA Next Gen by PCR, Nasal     Status: None   Collection Time: 08/15/24  3:15 PM   Specimen: Nasal Mucosa; Nasal Swab  Result Value Ref Range Status   MRSA by PCR Next Gen NOT DETECTED NOT DETECTED Final    Comment: (NOTE) The GeneXpert MRSA Assay (FDA approved for NASAL specimens only), is one component of a comprehensive MRSA colonization surveillance program. It is not intended to diagnose MRSA infection nor to guide or monitor treatment for MRSA infections. Test performance is not FDA approved in patients less than 67 years old. Performed at [facility], [address], [place], [place] [address]   Resp panel by RT-PCR (RSV, Flu A&B, Covid) Anterior Nasal Swab     Status: None   Collection Time: 08/21/24  2:38 PM   Specimen: Anterior Nasal Swab  Result Value Ref Range Status   SARS Coronavirus 2 by RT PCR NEGATIVE NEGATIVE Final   Influenza A by PCR NEGATIVE NEGATIVE Final   Influenza B by PCR NEGATIVE NEGATIVE Final    Comment: (NOTE) The Xpert Xpress SARS-CoV-2/FLU/RSV plus assay is intended as an aid in the diagnosis of influenza from Nasopharyngeal swab specimens and should not be used as a sole basis for treatment. Nasal washings and aspirates are unacceptable for Xpert Xpress  SARS-CoV-2/FLU/RSV testing.  Fact Sheet for Patients: BloggerCourse.com  Fact Sheet for Healthcare Providers: SeriousBroker.it  This test is not yet approved or cleared by the [place]  FDA and has been authorized for detection and/or diagnosis of SARS-CoV-2 by FDA under an Emergency Use Authorization (EUA). This EUA will remain in effect (meaning this test can be used) for the duration of the COVID-19 declaration under Section 564(b)(1) of the Act, 21 U.S.C. section 360bbb-3(b)(1), unless the authorization is terminated or revoked.     Resp Syncytial Virus by PCR NEGATIVE NEGATIVE Final    Comment: (NOTE) Fact Sheet for Patients: BloggerCourse.com  Fact Sheet for Healthcare Providers: SeriousBroker.it  This test is not yet approved or cleared by the [place]  FDA and has been authorized for detection and/or diagnosis of SARS-CoV-2 by FDA under an Emergency Use Authorization (EUA). This EUA will remain in effect (meaning this test can be used) for the duration of the COVID-19 declaration under Section 564(b)(1) of the Act, 21 U.S.C. section 360bbb-3(b)(1), unless the authorization is terminated or revoked.  Performed at [facility], [address] [place], [place] [address]   Culture, blood (Routine X 2) w Reflex to ID Panel     Status: None (Preliminary result)   Collection Time: 08/22/24  4:36 AM   Specimen: BLOOD  Result Value Ref Range Status   Specimen Description BLOOD SITE NOT SPECIFIED  Final   Special Requests   Final    BOTTLES DRAWN AEROBIC AND ANAEROBIC Blood Culture adequate volume   Culture   Final    NO GROWTH 2 DAYS Performed at [facility], [address], [place], [place] [address]    Report Status PENDING  Incomplete  Body fluid culture w Gram Stain     Status: None (Preliminary result)   Collection Time: 08/23/24  4:12 PM    Specimen: Joint, Left Elbow; Body Fluid  Result Value Ref Range Status   Specimen Description SYNOVIAL  Final   Special Requests LEFT ELBOW  Final   Gram Stain   Final    FEW WBC PRESENT, PREDOMINANTLY PMN NO ORGANISMS SEEN    Culture   Final    NO GROWTH < 24 HOURS Performed at [facility], [address], [place], [place] [address]    Report Status PENDING  Incomplete    I have personally spent 28 minutes involved in face-to-face and non-face-to-face activities for this patient on the day of the visit. Professional time spent includes the following activities: preparing to see the patient (review of tests), performing a medically appropriate examination, ordering medications, communicating with other health care professionals, documenting clinical information in the EMR, communicating results and counseling patient regarding medication and plan of care, and care coordination.    Greg Dynastie Knoop, NP [facility]  08/24/2024  2:00 PM

## 2024-08-24 NOTE — Progress Notes (Signed)
 PROGRESS NOTE    Beth [name]  FMW:[id number] DOB: Mar 02, 1946 DOA: 08/21/2024 PCP: Zollie Lowers, MD    Brief Narrative:  78 year old with history of recent PE on Eliquis , CKD stage IV, DM 2, HLD, HTN, CVA, diastolic CHF, obesity, gout, infected total right knee replacement, otitis media presented with hematemesis from [facility].  There is also suspicion for left arm cellulitis/septic arthritis therefore orthopedic consulted.  Left elbow aspirated by IR on 9/16.   Assessment & Plan:  Hematemesis, stable -Patient seen by OB GI, endoscopy performed on 9/15 showing normal esophagus, 4 cm hiatal hernia, some gastritis.  No obvious source of bleeding identified therefore resumed Eliquis   Left elbow cellulitis Acute left knee pain and chronic right knee pain -Adamantly refusing any surgical intervention as right knee would require amputation.  Seen by orthopedic.   -MRI suggestive of left elbow fluid collection, which was aspirated by IR on 9/16 - Antibiotics per ID  Elevated troponin - This is chronic.  Patient is not in any chest pain.  Clinically insignificant.  Discussed with cardiology, recommending conservative management and monitoring for now. Dr Burton cleared for Endo eval by GI.  They will be available for official consult if necessary.  DVT/PE - Cleared by GI to resume Eliquis   Facial droop with left otitis media/externa Facial nerve root compression and weakness - Had recent herpes breakout, seen by ENT underwent left myringotomy with tube placement on 9/8.  Current recommendations are to complete p.o. valacyclovir , steroid cream and outpatient ENT follow-up.  Holding Augmentin  as patient is already on Vanc/cefepime . Valacyclovir  EOT 9/17 Ear ointment EOT 9/20  Essential hypertension - On Cardizem , hydralazine , Imdur   Chronic diastolic CHF, EF 34% - Recent echocardiogram shows preserved EF with grade 1 DD  Diabetes mellitus type 2 - Jardiance  - Sliding scale and  Accu-Chek.  A1c 6.4  History of CVA with residual deficit - On aspirin , statin and Eliquis    CKD stage IV with slightly elevated anion gap - Creatinine around baseline of 2.13.  Will start sodium bicarb tablet   DVT prophylaxis: SCD    Code Status: Full Code Family Communication:   Status is: Inpatient Remains inpatient appropriate because: Continue hospital stay for further management and evaluation of left elbow cellulitis/pain   PT Follow up Recs:   Subjective: Seen at bedside no complaints.  Examination:  General exam: Appears calm and comfortable  Respiratory system: Clear to auscultation. Respiratory effort normal. Cardiovascular system: S1 & S2 heard, RRR. No JVD, murmurs, rubs, gallops or clicks. No pedal edema. Gastrointestinal system: Abdomen is nondistended, soft and nontender. No organomegaly or masses felt. Normal bowel sounds heard. Central nervous system: Alert and oriented. No focal neurological deficits. Extremities: Symmetric 5 x 5 power. Skin: No rashes, lesions or ulcers, left elbow dressing in place Psychiatry: Judgement and insight appear normal. Mood & affect appropriate.                Diet Orders (From admission, onward)     Start     Ordered   08/22/24 1020  Diet Carb Modified Fluid consistency: Thin; Room service appropriate? Yes  Diet effective now       Question Answer Comment  Diet-HS Snack? Nothing   Calorie Level Medium 1600-2000   Fluid consistency: Thin   Room service appropriate? Yes      08/22/24 1019            Objective: Vitals:   08/24/24 0453 08/24/24 0719 08/24/24 0850 08/24/24 1115  BP: (!) 167/93 (!) 171/93 (!) 171/93 (!) 164/89  Pulse: 79 85    Resp: 15   17  Temp: 97.9 F (36.6 C) 98.8 F (37.1 C)  98.4 F (36.9 C)  TempSrc: Oral Oral  Oral  SpO2: 98% 99%    Weight:      Height:        Intake/Output Summary (Last 24 hours) at 08/24/2024 1206 Last data filed at 08/24/2024 1147 Gross per 24 hour   Intake 240 ml  Output 1000 ml  Net -760 ml   Filed Weights   08/22/24 0839  Weight: 99.3 kg    Scheduled Meds:  apixaban   5 mg Oral BID   aspirin  EC  81 mg Oral Daily   atorvastatin   40 mg Oral QHS   azelastine   1 spray Each Nare BID   ciprofloxacin -dexamethasone   4 drop Left EAR BID   diltiazem   240 mg Oral Daily   famotidine   20 mg Oral Daily   [START ON 08/25/2024] ferrous sulfate   325 mg Oral Once per day on Monday Thursday   hydrALAZINE   25 mg Oral Q8H   insulin  aspart  0-9 Units Subcutaneous Q4H   isosorbide  mononitrate  60 mg Oral Daily   metroNIDAZOLE   500 mg Oral Q12H   pantoprazole   40 mg Oral QAC breakfast   sodium bicarbonate   650 mg Oral BID   valACYclovir   500 mg Oral BID   Continuous Infusions:  ceFEPime  (MAXIPIME ) IV 2 g (08/23/24 2104)   vancomycin  1,000 mg (08/23/24 2237)    Nutritional status     Body mass index is 36.43 kg/m.  Data Reviewed:   CBC: Recent Labs  Lab 08/18/24 0234 08/19/24 0320 08/21/24 1438 08/22/24 0530 08/22/24 1225 08/22/24 1946 08/23/24 0607 08/24/24 0528  WBC 6.1 5.8 8.1 7.0 7.7 7.1 5.5 5.2  NEUTROABS 3.8 3.5 5.4  --   --   --   --   --   HGB 9.6* 9.1* 10.2* 9.3* 9.2* 9.2* 9.3* 9.2*  HCT 30.6* 28.7* 32.6* 30.5* 30.9* 30.4* 30.3* 29.7*  MCV 86.0 84.2 85.8 86.9 88.5 86.9 86.3 85.1  PLT 297 304 398 356 378 369 380 365   Basic Metabolic Panel: Recent Labs  Lab 08/19/24 0320 08/21/24 1438 08/22/24 0435 08/22/24 1203 08/22/24 1946 08/23/24 0607 08/24/24 0528  NA 136 137  --   --  136 136 137  K 3.8 3.8  --   --  4.3 4.4 4.0  CL 105 104  --   --  108 107 104  CO2 19* 20*  --   --  19* 19* 16*  GLUCOSE 126* 132*  --   --  149* 136* 105*  BUN 32* 29*  --   --  31* 29* 29*  CREATININE 2.13* 2.13*  --   --  2.04* 1.95* 1.98*  CALCIUM  7.8* 8.3*  --   --  8.3* 8.4* 8.3*  MG 1.9  --  1.9 2.0  --  2.2 2.0  PHOS  --   --   --  4.3  --   --   --    GFR: Estimated Creatinine Clearance: 26.4 mL/min (A) (by C-G  formula based on SCr of 1.98 mg/dL (H)). Liver Function Tests: Recent Labs  Lab 08/21/24 1438 08/22/24 1946  AST 21 17  ALT 16 14  ALKPHOS 84 80  BILITOT 1.0 0.9  PROT 6.9 6.6  ALBUMIN  2.2* 2.0*   Recent Labs  Lab 08/21/24 1438  LIPASE 42   No results for input(s): AMMONIA in the last 168 hours. Coagulation Profile: Recent Labs  Lab 08/21/24 1438  INR 1.9*   Cardiac Enzymes: No results for input(s): CKTOTAL, CKMB, CKMBINDEX, TROPONINI in the last 168 hours. BNP (last 3 results) No results for input(s): PROBNP in the last 8760 hours. HbA1C: Recent Labs    08/22/24 0530  HGBA1C 6.4*   CBG: Recent Labs  Lab 08/23/24 1606 08/23/24 2016 08/23/24 2310 08/24/24 0452 08/24/24 0719  GLUCAP 146* 189* 143* 102* 110*   Lipid Profile: No results for input(s): CHOL, HDL, LDLCALC, TRIG, CHOLHDL, LDLDIRECT in the last 72 hours. Thyroid  Function Tests: No results for input(s): TSH, T4TOTAL, FREET4, T3FREE, THYROIDAB in the last 72 hours. Anemia Panel: Recent Labs    08/22/24 0435 08/22/24 0530  VITAMINB12 445  --   FOLATE >20.0  --   FERRITIN 321*  --   TIBC 125*  --   IRON  25*  --   RETICCTPCT  --  1.3   Sepsis Labs: Recent Labs  Lab 08/18/24 0234 08/19/24 0320  PROCALCITON <0.10 0.13    Recent Results (from the past 240 hours)  Blood Culture (routine x 2)     Status: None   Collection Time: 08/14/24  8:43 PM   Specimen: BLOOD  Result Value Ref Range Status   Specimen Description BLOOD LEFT ANTECUBITAL  Final   Special Requests   Final    BOTTLES DRAWN AEROBIC AND ANAEROBIC Blood Culture adequate volume   Culture   Final    NO GROWTH 5 DAYS Performed at [facility], [address], [place], [place] [address]    Report Status 08/19/2024 FINAL  Final  Resp panel by RT-PCR (RSV, Flu A&B, Covid) Anterior Nasal Swab     Status: None   Collection Time: 08/14/24  8:43 PM   Specimen: Anterior Nasal Swab  Result  Value Ref Range Status   SARS Coronavirus 2 by RT PCR NEGATIVE NEGATIVE Final   Influenza A by PCR NEGATIVE NEGATIVE Final   Influenza B by PCR NEGATIVE NEGATIVE Final    Comment: (NOTE) The Xpert Xpress SARS-CoV-2/FLU/RSV plus assay is intended as an aid in the diagnosis of influenza from Nasopharyngeal swab specimens and should not be used as a sole basis for treatment. Nasal washings and aspirates are unacceptable for Xpert Xpress SARS-CoV-2/FLU/RSV testing.  Fact Sheet for Patients: BloggerCourse.com  Fact Sheet for Healthcare Providers: SeriousBroker.it  This test is not yet approved or cleared by the [place]  FDA and has been authorized for detection and/or diagnosis of SARS-CoV-2 by FDA under an Emergency Use Authorization (EUA). This EUA will remain in effect (meaning this test can be used) for the duration of the COVID-19 declaration under Section 564(b)(1) of the Act, 21 U.S.C. section 360bbb-3(b)(1), unless the authorization is terminated or revoked.     Resp Syncytial Virus by PCR NEGATIVE NEGATIVE Final    Comment: (NOTE) Fact Sheet for Patients: BloggerCourse.com  Fact Sheet for Healthcare Providers: SeriousBroker.it  This test is not yet approved or cleared by the [place]  FDA and has been authorized for detection and/or diagnosis of SARS-CoV-2 by FDA under an Emergency Use Authorization (EUA). This EUA will remain in effect (meaning this test can be used) for the duration of the COVID-19 declaration under Section 564(b)(1) of the Act, 21 U.S.C. section 360bbb-3(b)(1), unless the authorization is terminated or revoked.  Performed at [facility], [address] [place], [place] [address]  Blood Culture (routine x 2)     Status: None   Collection Time: 08/14/24 10:30 PM   Specimen: BLOOD  Result Value Ref Range Status   Specimen Description  BLOOD BLOOD LEFT HAND  Final   Special Requests   Final    BOTTLES DRAWN AEROBIC ONLY Blood Culture results may not be optimal due to an inadequate volume of blood received in culture bottles   Culture   Final    NO GROWTH 5 DAYS Performed at [facility], [address] [place], [place] [address]    Report Status 08/19/2024 FINAL  Final  Aerobic/Anaerobic Culture w Gram Stain (surgical/deep wound)     Status: None   Collection Time: 08/15/24 12:01 PM   Specimen: Ear, Left; Abscess  Result Value Ref Range Status   Specimen Description ABSCESS  Final   Special Requests EAR,LEFT  Final   Gram Stain NO WBC SEEN NO ORGANISMS SEEN   Final   Culture   Final    No growth aerobically or anaerobically. Performed at [facility], [address] [place], [place] [address]    Report Status 08/20/2024 FINAL  Final  MRSA Next Gen by PCR, Nasal     Status: None   Collection Time: 08/15/24  3:15 PM   Specimen: Nasal Mucosa; Nasal Swab  Result Value Ref Range Status   MRSA by PCR Next Gen NOT DETECTED NOT DETECTED Final    Comment: (NOTE) The GeneXpert MRSA Assay (FDA approved for NASAL specimens only), is one component of a comprehensive MRSA colonization surveillance program. It is not intended to diagnose MRSA infection nor to guide or monitor treatment for MRSA infections. Test performance is not FDA approved in patients less than 37 years old. Performed at [facility], [address], [place], [place] [address]   Resp panel by RT-PCR (RSV, Flu A&B, Covid) Anterior Nasal Swab     Status: None   Collection Time: 08/21/24  2:38 PM   Specimen: Anterior Nasal Swab  Result Value Ref Range Status   SARS Coronavirus 2 by RT PCR NEGATIVE NEGATIVE Final   Influenza A by PCR NEGATIVE NEGATIVE Final   Influenza B by PCR NEGATIVE NEGATIVE Final    Comment: (NOTE) The Xpert Xpress SARS-CoV-2/FLU/RSV plus assay is intended as an aid in the diagnosis of influenza from  Nasopharyngeal swab specimens and should not be used as a sole basis for treatment. Nasal washings and aspirates are unacceptable for Xpert Xpress SARS-CoV-2/FLU/RSV testing.  Fact Sheet for Patients: BloggerCourse.com  Fact Sheet for Healthcare Providers: SeriousBroker.it  This test is not yet approved or cleared by the [place]  FDA and has been authorized for detection and/or diagnosis of SARS-CoV-2 by FDA under an Emergency Use Authorization (EUA). This EUA will remain in effect (meaning this test can be used) for the duration of the COVID-19 declaration under Section 564(b)(1) of the Act, 21 U.S.C. section 360bbb-3(b)(1), unless the authorization is terminated or revoked.     Resp Syncytial Virus by PCR NEGATIVE NEGATIVE Final    Comment: (NOTE) Fact Sheet for Patients: BloggerCourse.com  Fact Sheet for Healthcare Providers: SeriousBroker.it  This test is not yet approved or cleared by the [place]  FDA and has been authorized for detection and/or diagnosis of SARS-CoV-2 by FDA under an Emergency Use Authorization (EUA). This EUA will remain in effect (meaning this test can be used) for the duration of the COVID-19 declaration under Section 564(b)(1) of the Act, 21  U.S.C. section 360bbb-3(b)(1), unless the authorization is terminated or revoked.  Performed at [facility], [address], [place], [place] [address]   Culture, blood (Routine X 2) w Reflex to ID Panel     Status: None (Preliminary result)   Collection Time: 08/22/24  4:36 AM   Specimen: BLOOD  Result Value Ref Range Status   Specimen Description BLOOD SITE NOT SPECIFIED  Final   Special Requests   Final    BOTTLES DRAWN AEROBIC AND ANAEROBIC Blood Culture adequate volume   Culture   Final    NO GROWTH 2 DAYS Performed at [facility], [address] [place], [place] [address]     Report Status PENDING  Incomplete  Body fluid culture w Gram Stain     Status: None (Preliminary result)   Collection Time: 08/23/24  4:12 PM   Specimen: Joint, Left Elbow; Body Fluid  Result Value Ref Range Status   Specimen Description SYNOVIAL  Final   Special Requests LEFT ELBOW  Final   Gram Stain   Final    FEW WBC PRESENT, PREDOMINANTLY PMN NO ORGANISMS SEEN    Culture   Final    NO GROWTH < 24 HOURS Performed at [facility], [address] [place], [place] [address]    Report Status PENDING  Incomplete         Radiology Studies: DG FLUORO GUIDED NEEDLE PLC ASPIRATION/INJECTION LOC Result Date: 08/23/2024 CLINICAL DATA:  History of chronic right septic knee. History of gout. Having left elbow pain and swelling. Consulted for fluoroscopic guided left elbow aspiration. EXAM: LEFT ELBOW ASPIRATION UNDER FLUOROSCOPY TECHNIQUE: Signed informed consent was obtained from the patient after discussing all risks and benefits of the procedure. Patient was then placed in a prone position with her left arm and elbow over her head so the left elbow was isolated under fluoroscopy. An appropriate skin entrance site was determined. The site was marked, prepped with Betadine , draped in the usual sterile fashion, and infiltrated locally with Lidocaine . A 21 gauge needle was advanced from the lateral aspect of the elbow into the humeroulnar joint space under intermittent fluoroscopy. 1mL Omnipaque  180 injected easily and opacified the joint space. 3mL of blood tinged fluid was then aspirated. Needle was removed and dressing was applied. No immediate complication. FLUOROSCOPY: Radiation Exposure Index (as provided by the fluoroscopic device): 0.80 mGy Kerma FINDINGS: Opacification of the left elbow joint with contrast confirmed intra-articular position of needle. 3 mL of blood tinged fluid was aspirated, consistent with successful left elbow aspiration. IMPRESSION: Technically successful left elbow  joint aspiration. 3 mL blood tinged fluid aspirated and sent to lab. Performed by: Wyatt Pommier, PA-C Electronically Signed   By: Norleen DELENA Kil M.D.   On: 08/23/2024 16:20   MR ELBOW LEFT WO CONTRAST Result Date: 08/22/2024 CLINICAL DATA:  Left elbow swelling and erythema. Septic arthritis suspected. EXAM: MRI OF THE LEFT ELBOW WITHOUT CONTRAST-limited TECHNIQUE: Multiplanar, multisequence MR imaging of the elbow was attempted. Patient was unable to tolerate the examination. Only axial images were obtained, and these are motion degraded. No intravenous contrast was administered. COMPARISON:  CT 08/21/2024 FINDINGS: The study is quite limited by its incomplete nature and motion. There is moderate-sized joint effusion as seen on recent CT. This appears complex with synovial thickening. Underlying advanced ulnar humeral and radiocapitellar degenerative changes with joint space narrowing and osteophytes, also better seen on CT. No definite acute osseous findings are seen. There is soft  tissue swelling around the elbow without evidence of focal fluid collection or foreign body. IMPRESSION: 1. Incomplete examination with only motion-degraded axial images obtained. 2. Moderate-sized joint effusion with synovial thickening, nonspecific. Septic arthritis not excluded. Consider joint aspiration. 3. Underlying advanced degenerative changes of the elbow. No definite acute osseous findings. 4. Soft tissue swelling around the elbow without evidence of focal fluid collection or foreign body. Electronically Signed   By: Elsie Perone M.D.   On: 08/22/2024 18:38           LOS: 3 days   Time spent= 35 mins    Burgess JAYSON Dare, MD [facility]  If 7PM-7AM, please contact night-coverage  08/24/2024, 12:06 PM

## 2024-08-25 DIAGNOSIS — Z87898 Personal history of other specified conditions: Secondary | ICD-10-CM | POA: Diagnosis not present

## 2024-08-25 DIAGNOSIS — K920 Hematemesis: Secondary | ICD-10-CM | POA: Diagnosis not present

## 2024-08-25 DIAGNOSIS — M00822 Arthritis due to other bacteria, left elbow: Secondary | ICD-10-CM | POA: Diagnosis not present

## 2024-08-25 LAB — CBC
HCT: 26.5 % — ABNORMAL LOW (ref 36.0–46.0)
Hemoglobin: 8.3 g/dL — ABNORMAL LOW (ref 12.0–15.0)
MCH: 26.3 pg (ref 26.0–34.0)
MCHC: 31.3 g/dL (ref 30.0–36.0)
MCV: 83.9 fL (ref 80.0–100.0)
Platelets: 371 K/uL (ref 150–400)
RBC: 3.16 MIL/uL — ABNORMAL LOW (ref 3.87–5.11)
RDW: 16.3 % — ABNORMAL HIGH (ref 11.5–15.5)
WBC: 5.3 K/uL (ref 4.0–10.5)
nRBC: 0 % (ref 0.0–0.2)

## 2024-08-25 LAB — BASIC METABOLIC PANEL WITH GFR
Anion gap: 17 — ABNORMAL HIGH (ref 5–15)
BUN: 32 mg/dL — ABNORMAL HIGH (ref 8–23)
CO2: 16 mmol/L — ABNORMAL LOW (ref 22–32)
Calcium: 8.2 mg/dL — ABNORMAL LOW (ref 8.9–10.3)
Chloride: 106 mmol/L (ref 98–111)
Creatinine, Ser: 1.89 mg/dL — ABNORMAL HIGH (ref 0.44–1.00)
GFR, Estimated: 27 mL/min — ABNORMAL LOW (ref >60)
Glucose, Bld: 101 mg/dL — ABNORMAL HIGH (ref 70–99)
Potassium: 4.1 mmol/L (ref 3.5–5.1)
Sodium: 139 mmol/L (ref 135–145)

## 2024-08-25 LAB — GLUCOSE, CAPILLARY
Glucose-Capillary: 111 mg/dL — ABNORMAL HIGH (ref 70–99)
Glucose-Capillary: 97 mg/dL (ref 70–99)
Glucose-Capillary: 101 mg/dL — ABNORMAL HIGH (ref 70–99)
Glucose-Capillary: 109 mg/dL — ABNORMAL HIGH (ref 70–99)
Glucose-Capillary: 136 mg/dL — ABNORMAL HIGH (ref 70–99)
Glucose-Capillary: 104 mg/dL — ABNORMAL HIGH (ref 70–99)

## 2024-08-25 LAB — MAGNESIUM: Magnesium: 2 mg/dL (ref 1.7–2.4)

## 2024-08-25 NOTE — Progress Notes (Signed)
 PROGRESS NOTE    Beth [name]  FMW:[id number] DOB: January 12, 1946 DOA: 08/21/2024 PCP: Zollie Lowers, MD    Brief Narrative:  78 year old with history of recent PE on Eliquis , CKD stage IV, DM 2, HLD, HTN, CVA, diastolic CHF, obesity, gout, infected total right knee replacement, otitis media presented with hematemesis from [facility].  There is also suspicion for left arm cellulitis/septic arthritis therefore orthopedic consulted.  Left elbow aspirated by IR on 9/16.   Assessment & Plan:    Left elbow cellulitis Acute left knee pain and chronic right knee pain -Adamantly refusing any surgical intervention as right knee would require amputation.  Seen by orthopedic.   -MRI suggestive of left elbow fluid collection, which was aspirated by IR on 9/16 without evidence of any crystals.  Culture data is pending, continue broad-spectrum antibiotics.  Elevated troponin - This is chronic.  Patient is not in any chest pain.  Clinically insignificant.  Discussed with cardiology, recommending conservative management and monitoring for now. Dr Burton cleared for Endo eval by GI.  They will be available for official consult if necessary.  Hematemesis, stable -Patient seen by OB GI, endoscopy performed on 9/15 showing normal esophagus, 4 cm hiatal hernia, some gastritis.  No obvious source of bleeding identified therefore resumed Eliquis   DVT/PE, recent - Cleared by GI to resume Eliquis   Facial droop with left otitis media/externa Facial nerve root compression and weakness - Had recent herpes breakout, seen by ENT underwent left myringotomy with tube placement on 9/8.  Current recommendations are to complete p.o. valacyclovir , steroid cream and outpatient ENT follow-up.  Holding Augmentin  as patient is already on Vanc/cefepime . Valacyclovir  EOT 9/17 Ear ointment EOT 9/20  Essential hypertension - On Cardizem , hydralazine , Imdur   Chronic diastolic CHF, EF 34% - Recent echocardiogram shows  preserved EF with grade 1 DD  Diabetes mellitus type 2 - Jardiance  - Sliding scale and Accu-Chek.  A1c 6.4  History of CVA with residual deficit - On aspirin , statin and Eliquis    CKD stage IV with slightly elevated anion gap - Creatinine around baseline 2.0, improving  DVT prophylaxis: SCD    Code Status: Full Code Family Communication:   Status is: Inpatient Remains inpatient appropriate because:   PT Follow up Recs: Skilled Nursing-Short Term Rehab (<3 Hours/Day)08/23/2024 1100. TOC consulted.   Subjective: Seen at bedside.  Tells me she wants to be left alone as people keep coming in the room and disturbing her  Examination:  General exam: Appears calm and comfortable  Respiratory system: Clear to auscultation. Respiratory effort normal. Cardiovascular system: S1 & S2 heard, RRR. No JVD, murmurs, rubs, gallops or clicks. No pedal edema. Gastrointestinal system: Abdomen is nondistended, soft and nontender. No organomegaly or masses felt. Normal bowel sounds heard. Central nervous system: Alert and oriented. No focal neurological deficits. Extremities: Symmetric 5 x 5 power. Skin: No rashes, lesions or ulcers, left elbow dressing in place Psychiatry: Judgement and insight appear normal. Mood & affect appropriate.                Diet Orders (From admission, onward)     Start     Ordered   08/22/24 1020  Diet Carb Modified Fluid consistency: Thin; Room service appropriate? Yes  Diet effective now       Question Answer Comment  Diet-HS Snack? Nothing   Calorie Level Medium 1600-2000   Fluid consistency: Thin   Room service appropriate? Yes      08/22/24 1019  Objective: Vitals:   08/25/24 0335 08/25/24 0545 08/25/24 0752 08/25/24 0927  BP: (!) 140/74 (!) 153/86 (!) 171/97 (!) 171/97  Pulse: 82  80   Resp: (!) 22  17   Temp: 98.6 F (37 C)  98.8 F (37.1 C)   TempSrc: Oral  Oral   SpO2:   97%   Weight:      Height:         Intake/Output Summary (Last 24 hours) at 08/25/2024 1156 Last data filed at 08/25/2024 0800 Gross per 24 hour  Intake --  Output 700 ml  Net -700 ml   Filed Weights   08/22/24 0839  Weight: 99.3 kg    Scheduled Meds:  apixaban   5 mg Oral BID   aspirin  EC  81 mg Oral Daily   atorvastatin   40 mg Oral QHS   azelastine   1 spray Each Nare BID   ciprofloxacin -dexamethasone   4 drop Left EAR BID   diltiazem   240 mg Oral Daily   famotidine   20 mg Oral Daily   ferrous sulfate   325 mg Oral Once per day on Monday Thursday   hydrALAZINE   25 mg Oral Q8H   insulin  aspart  0-9 Units Subcutaneous Q4H   isosorbide  mononitrate  60 mg Oral Daily   metroNIDAZOLE   500 mg Oral Q12H   pantoprazole   40 mg Oral QAC breakfast   sodium bicarbonate   650 mg Oral BID   Continuous Infusions:  ceFEPime  (MAXIPIME ) IV 2 g (08/24/24 2118)   vancomycin  1,000 mg (08/23/24 2237)    Nutritional status     Body mass index is 36.43 kg/m.  Data Reviewed:   CBC: Recent Labs  Lab 08/19/24 0320 08/21/24 1438 08/22/24 0530 08/22/24 1225 08/22/24 1946 08/23/24 0607 08/24/24 0528 08/25/24 0458  WBC 5.8 8.1   < > 7.7 7.1 5.5 5.2 5.3  NEUTROABS 3.5 5.4  --   --   --   --   --   --   HGB 9.1* 10.2*   < > 9.2* 9.2* 9.3* 9.2* 8.3*  HCT 28.7* 32.6*   < > 30.9* 30.4* 30.3* 29.7* 26.5*  MCV 84.2 85.8   < > 88.5 86.9 86.3 85.1 83.9  PLT 304 398   < > 378 369 380 365 371   < > = values in this interval not displayed.   Basic Metabolic Panel: Recent Labs  Lab 08/21/24 1438 08/22/24 0435 08/22/24 1203 08/22/24 1946 08/23/24 0607 08/24/24 0528 08/25/24 0458  NA 137  --   --  136 136 137 139  K 3.8  --   --  4.3 4.4 4.0 4.1  CL 104  --   --  108 107 104 106  CO2 20*  --   --  19* 19* 16* 16*  GLUCOSE 132*  --   --  149* 136* 105* 101*  BUN 29*  --   --  31* 29* 29* 32*  CREATININE 2.13*  --   --  2.04* 1.95* 1.98* 1.89*  CALCIUM  8.3*  --   --  8.3* 8.4* 8.3* 8.2*  MG  --  1.9 2.0  --  2.2 2.0 2.0   PHOS  --   --  4.3  --   --   --   --    GFR: Estimated Creatinine Clearance: 27.7 mL/min (A) (by C-G formula based on SCr of 1.89 mg/dL (H)). Liver Function Tests: Recent Labs  Lab 08/21/24 1438 08/22/24 1946  AST 21 17  ALT 16 14  ALKPHOS 84 80  BILITOT 1.0 0.9  PROT 6.9 6.6  ALBUMIN  2.2* 2.0*   Recent Labs  Lab 08/21/24 1438  LIPASE 42   No results for input(s): AMMONIA in the last 168 hours. Coagulation Profile: Recent Labs  Lab 08/21/24 1438  INR 1.9*   Cardiac Enzymes: No results for input(s): CKTOTAL, CKMB, CKMBINDEX, TROPONINI in the last 168 hours. BNP (last 3 results) No results for input(s): PROBNP in the last 8760 hours. HbA1C: No results for input(s): HGBA1C in the last 72 hours. CBG: Recent Labs  Lab 08/24/24 1607 08/24/24 2017 08/24/24 2325 08/25/24 0334 08/25/24 0749  GLUCAP 98 151* 143* 111* 97   Lipid Profile: No results for input(s): CHOL, HDL, LDLCALC, TRIG, CHOLHDL, LDLDIRECT in the last 72 hours. Thyroid  Function Tests: No results for input(s): TSH, T4TOTAL, FREET4, T3FREE, THYROIDAB in the last 72 hours. Anemia Panel: No results for input(s): VITAMINB12, FOLATE, FERRITIN, TIBC, IRON , RETICCTPCT in the last 72 hours. Sepsis Labs: Recent Labs  Lab 08/19/24 0320  PROCALCITON 0.13    Recent Results (from the past 240 hours)  Aerobic/Anaerobic Culture w Gram Stain (surgical/deep wound)     Status: None   Collection Time: 08/15/24 12:01 PM   Specimen: Ear, Left; Abscess  Result Value Ref Range Status   Specimen Description ABSCESS  Final   Special Requests EAR,LEFT  Final   Gram Stain NO WBC SEEN NO ORGANISMS SEEN   Final   Culture   Final    No growth aerobically or anaerobically. Performed at [facility], [address], [place], [place] [address]    Report Status 08/20/2024 FINAL  Final  MRSA Next Gen by PCR, Nasal     Status: None   Collection Time: 08/15/24  3:15 PM    Specimen: Nasal Mucosa; Nasal Swab  Result Value Ref Range Status   MRSA by PCR Next Gen NOT DETECTED NOT DETECTED Final    Comment: (NOTE) The GeneXpert MRSA Assay (FDA approved for NASAL specimens only), is one component of a comprehensive MRSA colonization surveillance program. It is not intended to diagnose MRSA infection nor to guide or monitor treatment for MRSA infections. Test performance is not FDA approved in patients less than 50 years old. Performed at [facility], [address] [place], [place] [address]   Resp panel by RT-PCR (RSV, Flu A&B, Covid) Anterior Nasal Swab     Status: None   Collection Time: 08/21/24  2:38 PM   Specimen: Anterior Nasal Swab  Result Value Ref Range Status   SARS Coronavirus 2 by RT PCR NEGATIVE NEGATIVE Final   Influenza A by PCR NEGATIVE NEGATIVE Final   Influenza B by PCR NEGATIVE NEGATIVE Final    Comment: (NOTE) The Xpert Xpress SARS-CoV-2/FLU/RSV plus assay is intended as an aid in the diagnosis of influenza from Nasopharyngeal swab specimens and should not be used as a sole basis for treatment. Nasal washings and aspirates are unacceptable for Xpert Xpress SARS-CoV-2/FLU/RSV testing.  Fact Sheet for Patients: BloggerCourse.com  Fact Sheet for Healthcare Providers: SeriousBroker.it  This test is not yet approved or cleared by the [place]  FDA and has been authorized for detection and/or diagnosis of SARS-CoV-2 by FDA under an Emergency Use Authorization (EUA). This EUA will remain in effect (meaning this test can be used) for the duration of the COVID-19 declaration under Section 564(b)(1) of the Act, 21 U.S.C. section 360bbb-3(b)(1), unless the authorization is terminated or revoked.  Resp Syncytial Virus by PCR NEGATIVE NEGATIVE Final    Comment: (NOTE) Fact Sheet for Patients: BloggerCourse.com  Fact Sheet for Healthcare  Providers: SeriousBroker.it  This test is not yet approved or cleared by the [place]  FDA and has been authorized for detection and/or diagnosis of SARS-CoV-2 by FDA under an Emergency Use Authorization (EUA). This EUA will remain in effect (meaning this test can be used) for the duration of the COVID-19 declaration under Section 564(b)(1) of the Act, 21 U.S.C. section 360bbb-3(b)(1), unless the authorization is terminated or revoked.  Performed at [facility], [address] [place], [place] [address]   Culture, blood (Routine X 2) w Reflex to ID Panel     Status: None (Preliminary result)   Collection Time: 08/22/24  4:36 AM   Specimen: BLOOD  Result Value Ref Range Status   Specimen Description BLOOD SITE NOT SPECIFIED  Final   Special Requests   Final    BOTTLES DRAWN AEROBIC AND ANAEROBIC Blood Culture adequate volume   Culture   Final    NO GROWTH 3 DAYS Performed at [facility], [address], [place], [place] [address]    Report Status PENDING  Incomplete  Body fluid culture w Gram Stain     Status: None (Preliminary result)   Collection Time: 08/23/24  4:12 PM   Specimen: Joint, Left Elbow; Body Fluid  Result Value Ref Range Status   Specimen Description SYNOVIAL  Final   Special Requests LEFT ELBOW  Final   Gram Stain   Final    FEW WBC PRESENT, PREDOMINANTLY PMN NO ORGANISMS SEEN    Culture   Final    NO GROWTH 2 DAYS Performed at [facility], [address] [place], [place] [address]    Report Status PENDING  Incomplete         Radiology Studies: DG FLUORO GUIDED NEEDLE PLC ASPIRATION/INJECTION LOC Result Date: 08/23/2024 CLINICAL DATA:  History of chronic right septic knee. History of gout. Having left elbow pain and swelling. Consulted for fluoroscopic guided left elbow aspiration. EXAM: LEFT ELBOW ASPIRATION UNDER FLUOROSCOPY TECHNIQUE: Signed informed consent was obtained from the patient after  discussing all risks and benefits of the procedure. Patient was then placed in a prone position with her left arm and elbow over her head so the left elbow was isolated under fluoroscopy. An appropriate skin entrance site was determined. The site was marked, prepped with Betadine , draped in the usual sterile fashion, and infiltrated locally with Lidocaine . A 21 gauge needle was advanced from the lateral aspect of the elbow into the humeroulnar joint space under intermittent fluoroscopy. 1mL Omnipaque  180 injected easily and opacified the joint space. 3mL of blood tinged fluid was then aspirated. Needle was removed and dressing was applied. No immediate complication. FLUOROSCOPY: Radiation Exposure Index (as provided by the fluoroscopic device): 0.80 mGy Kerma FINDINGS: Opacification of the left elbow joint with contrast confirmed intra-articular position of needle. 3 mL of blood tinged fluid was aspirated, consistent with successful left elbow aspiration. IMPRESSION: Technically successful left elbow joint aspiration. 3 mL blood tinged fluid aspirated and sent to lab. Performed by: Wyatt Pommier, PA-C Electronically Signed   By: Norleen DELENA Kil M.D.   On: 08/23/2024 16:20           LOS: 4 days   Time spent= 35 mins    Burgess JAYSON Dare, MD [facility]  If 7PM-7AM, please contact night-coverage  08/25/2024, 11:56 AM

## 2024-08-25 NOTE — Progress Notes (Signed)
 A user error has taken place: encounter opened in error, closed for administrative reasons. This encounter was created in error - please disregard. Patient rescheduled appt

## 2024-08-25 NOTE — Progress Notes (Signed)
 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   Ms. Bellmore continues to have warmth and tenderness of the left elbow concerning for septic arthritis. Recommend re-evaluation by Orthopedics for consideration of debridement. Discussed plan of care to continue with current dose of vancomycin  and cefepime . Continue therapeutic monitoring of renal function and vancomycin  levels. Remaining medical and supportive care per Internal Medicine.   PLAN:  Continue current dose of vancomycin  and ceftriaxone .  Recommend re-evaluation by Orthopedics for debridement.  Therapeutic drug monitoring of renal function and vancomycin  levels.  Remaining medical and supportive care per Internal Medicine.    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   History of pulmonary embolism   Anticoagulated    apixaban   5 mg Oral BID   aspirin  EC  81 mg Oral Daily   atorvastatin   40 mg Oral QHS   azelastine   1 spray Each Nare BID   ciprofloxacin -dexamethasone   4 drop Left EAR BID    diltiazem   240 mg Oral Daily   famotidine   20 mg Oral Daily   ferrous sulfate   325 mg Oral Once per day on Monday Thursday   hydrALAZINE   25 mg Oral Q8H   insulin  aspart  0-9 Units Subcutaneous Q4H   isosorbide  mononitrate  60 mg Oral Daily   metroNIDAZOLE   500 mg Oral Q12H   pantoprazole   40 mg Oral QAC breakfast   sodium bicarbonate   650 mg Oral BID    SUBJECTIVE:  Afebrile overnight with no acute events. Wants to be left alone.   Allergies  Allergen Reactions   Cefdinir Swelling and Rash    Tolerated cephalosporins many times in the past   Aleve [Naproxen Sodium] Other (See Comments)    Heart races   Dorethia Pao ] Other (See Comments)    Nose bleeding   Ciprofloxacin  Other (See Comments)    Possible hamstring tendinopathy   Clonidine  Derivatives Other (See Comments)    Dizziness and weakness   Shellfish Allergy Nausea And Vomiting     Review of Systems: Review of Systems  Constitutional:  Negative for chills, fever and weight loss.  Respiratory:  Negative for cough, shortness of breath and wheezing.   Cardiovascular:  Negative for chest pain and leg swelling.  Gastrointestinal:  Negative for abdominal pain, constipation, diarrhea, nausea and vomiting.  Musculoskeletal:        Left elbow pain  Skin:  Negative for rash.      OBJECTIVE: Vitals:   08/25/24 9247 08/25/24 9072  08/25/24 1216 08/25/24 1437  BP: (!) 171/97 (!) 171/97 (!) 158/84 (!) 163/86  Pulse: 80  94   Resp: 17  (!) 28   Temp: 98.8 F (37.1 C)  98.7 F (37.1 C)   TempSrc: Oral  Oral   SpO2: 97%  98%   Weight:      Height:       Body mass index is 36.43 kg/m.  Physical Exam Constitutional:      General: She is not in acute distress.    Appearance: She is well-developed.  Cardiovascular:     Rate and Rhythm: Normal rate and regular rhythm.     Heart sounds: Normal heart sounds.  Pulmonary:     Effort: Pulmonary effort is normal.     Breath sounds: Normal breath sounds.   Musculoskeletal:     Comments: Left elbow with no obvious deformity or discoloration and is diffusely warm and tender.   Skin:    General: Skin is warm and dry.  Neurological:     Mental Status: She is alert and oriented to person, place, and time.     Lab Results Lab Results  Component Value Date   WBC 5.3 08/25/2024   HGB 8.3 (L) 08/25/2024   HCT 26.5 (L) 08/25/2024   MCV 83.9 08/25/2024   PLT 371 08/25/2024    Lab Results  Component Value Date   CREATININE 1.89 (H) 08/25/2024   BUN 32 (H) 08/25/2024   NA 139 08/25/2024   K 4.1 08/25/2024   CL 106 08/25/2024   CO2 16 (L) 08/25/2024    Lab Results  Component Value Date   ALT 14 08/22/2024   AST 17 08/22/2024   ALKPHOS 80 08/22/2024   BILITOT 0.9 08/22/2024     Microbiology: Recent Results (from the past 240 hours)  Resp panel by RT-PCR (RSV, Flu A&B, Covid) Anterior Nasal Swab     Status: None   Collection Time: 08/21/24  2:38 PM   Specimen: Anterior Nasal Swab  Result Value Ref Range Status   SARS Coronavirus 2 by RT PCR NEGATIVE NEGATIVE Final   Influenza A by PCR NEGATIVE NEGATIVE Final   Influenza B by PCR NEGATIVE NEGATIVE Final    Comment: (NOTE) The Xpert Xpress SARS-CoV-2/FLU/RSV plus assay is intended as an aid in the diagnosis of influenza from Nasopharyngeal swab specimens and should not be used as a sole basis for treatment. Nasal washings and aspirates are unacceptable for Xpert Xpress SARS-CoV-2/FLU/RSV testing.  Fact Sheet for Patients: BloggerCourse.com  Fact Sheet for Healthcare Providers: SeriousBroker.it  This test is not yet approved or cleared by the [place]  FDA and has been authorized for detection and/or diagnosis of SARS-CoV-2 by FDA under an Emergency Use Authorization (EUA). This EUA will remain in effect (meaning this test can be used) for the duration of the COVID-19 declaration under Section 564(b)(1) of the Act, 21  U.S.C. section 360bbb-3(b)(1), unless the authorization is terminated or revoked.     Resp Syncytial Virus by PCR NEGATIVE NEGATIVE Final    Comment: (NOTE) Fact Sheet for Patients: BloggerCourse.com  Fact Sheet for Healthcare Providers: SeriousBroker.it  This test is not yet approved or cleared by the [place]  FDA and has been authorized for detection and/or diagnosis of SARS-CoV-2 by FDA under an Emergency Use Authorization (EUA). This EUA will remain in effect (meaning this test can be used) for the duration of the COVID-19 declaration under Section 564(b)(1) of the Act, 21 U.S.C. section 360bbb-3(b)(1), unless the authorization  is terminated or revoked.  Performed at [facility], [address], [place], [place] [address]   Culture, blood (Routine X 2) w Reflex to ID Panel     Status: None (Preliminary result)   Collection Time: 08/22/24  4:36 AM   Specimen: BLOOD  Result Value Ref Range Status   Specimen Description BLOOD SITE NOT SPECIFIED  Final   Special Requests   Final    BOTTLES DRAWN AEROBIC AND ANAEROBIC Blood Culture adequate volume   Culture   Final    NO GROWTH 3 DAYS Performed at [facility], [address] [place], [place] [address]    Report Status PENDING  Incomplete  Body fluid culture w Gram Stain     Status: None (Preliminary result)   Collection Time: 08/23/24  4:12 PM   Specimen: Joint, Left Elbow; Body Fluid  Result Value Ref Range Status   Specimen Description SYNOVIAL  Final   Special Requests LEFT ELBOW  Final   Gram Stain   Final    FEW WBC PRESENT, PREDOMINANTLY PMN NO ORGANISMS SEEN    Culture   Final    NO GROWTH 2 DAYS Performed at [facility], [address] [place], [place] [address]    Report Status PENDING  Incomplete   I have personally spent 26 minutes involved in face-to-face and non-face-to-face activities for this patient on the day of the visit.  Professional time spent includes the following activities: preparing to see the patient (review of tests),  performing a medically appropriate examination, ordering medications, communicating with other health care professionals, documenting clinical information in the EMR, communicating results and counseling patient regarding medication and plan of care, and care coordination.    Greg Shloma Roggenkamp, NP [facility]  08/25/2024  3:36 PM

## 2024-08-25 NOTE — Plan of Care (Signed)
  Problem: Education: Goal: Ability to describe self-care measures that may prevent or decrease complications (Diabetes Survival Skills Education) will improve Outcome: Progressing Goal: Individualized Educational Video(s) Outcome: Progressing   Problem: Coping: Goal: Ability to adjust to condition or change in health will improve Outcome: Progressing   Problem: Fluid Volume: Goal: Ability to maintain a balanced intake and output will improve Outcome: Progressing   Problem: Health Behavior/Discharge Planning: Goal: Ability to identify and utilize available resources and services will improve Outcome: Progressing Goal: Ability to manage health-related needs will improve Outcome: Progressing   Problem: Metabolic: Goal: Ability to maintain appropriate glucose levels will improve Outcome: Progressing   Problem: Nutritional: Goal: Maintenance of adequate nutrition will improve Outcome: Progressing Goal: Progress toward achieving an optimal weight will improve Outcome: Progressing   Problem: Skin Integrity: Goal: Risk for impaired skin integrity will decrease Outcome: Progressing   Problem: Tissue Perfusion: Goal: Adequacy of tissue perfusion will improve Outcome: Progressing   Problem: Clinical Measurements: Goal: Ability to avoid or minimize complications of infection will improve Outcome: Progressing   Problem: Skin Integrity: Goal: Skin integrity will improve Outcome: Progressing   Problem: Education: Goal: Knowledge of General Education information will improve Description: Including pain rating scale, medication(s)/side effects and non-pharmacologic comfort measures Outcome: Progressing   Problem: Health Behavior/Discharge Planning: Goal: Ability to manage health-related needs will improve Outcome: Progressing   Problem: Clinical Measurements: Goal: Ability to maintain clinical measurements within normal limits will improve Outcome: Progressing Goal: Will  remain free from infection Outcome: Progressing Goal: Diagnostic test results will improve Outcome: Progressing Goal: Respiratory complications will improve Outcome: Progressing Goal: Cardiovascular complication will be avoided Outcome: Progressing   Problem: Activity: Goal: Risk for activity intolerance will decrease Outcome: Progressing   Problem: Nutrition: Goal: Adequate nutrition will be maintained Outcome: Progressing   Problem: Coping: Goal: Level of anxiety will decrease Outcome: Progressing   Problem: Elimination: Goal: Will not experience complications related to bowel motility Outcome: Progressing Goal: Will not experience complications related to urinary retention Outcome: Progressing   Problem: Pain Managment: Goal: General experience of comfort will improve and/or be controlled Outcome: Progressing   Problem: Safety: Goal: Ability to remain free from injury will improve Outcome: Progressing   Problem: Skin Integrity: Goal: Risk for impaired skin integrity will decrease Outcome: Progressing

## 2024-08-25 NOTE — Progress Notes (Signed)
 Pharmacy Antibiotic Note  Beth [name] is a 78 y.o. female with Left elbow septic arthritis. Pharmacy has been consulted for cefepime  and vancomycin  dosing.  -WBC= 5.3, afebrile -SCr 1.89 (recent baseline ~ 2.0) -cultures- ngtd  Plan: -Continue vancomycin  (estimate AUC ~ 450) -Will check vancomycin  levels  Height: 5' 5 (165.1 cm) Weight: 99.3 kg (218 lb 14.7 oz) IBW/kg (Calculated) : 57  Temp (24hrs), Avg:98.6 F (37 C), Min:98.4 F (36.9 C), Max:98.8 F (37.1 C)  Recent Labs  Lab 08/21/24 1438 08/22/24 0530 08/22/24 1225 08/22/24 1946 08/23/24 0607 08/24/24 0528 08/25/24 0458  WBC 8.1   < > 7.7 7.1 5.5 5.2 5.3  CREATININE 2.13*  --   --  2.04* 1.95* 1.98* 1.89*   < > = values in this interval not displayed.    Estimated Creatinine Clearance: 27.7 mL/min (A) (by C-G formula based on SCr of 1.89 mg/dL (H)).    Allergies  Allergen Reactions   Cefdinir Swelling and Rash    Tolerated cephalosporins many times in the past   Aleve [Naproxen Sodium] Other (See Comments)    Heart races   Dorethia Lame ] Other (See Comments)    Nose bleeding   Ciprofloxacin  Other (See Comments)    Possible hamstring tendinopathy   Clonidine  Derivatives Other (See Comments)    Dizziness and weakness   Shellfish Allergy Nausea And Vomiting    Antimicrobials this admission: 9/14 Vanc>> 9/14 Cefepime  >> 9/16 flagyl   Dose adjustments this admission:   Microbiology results: 9/15 ARk:[id number]   Thank you for allowing pharmacy to be a part of this patient's care.  Prentice Poisson, PharmD Clinical Pharmacist **Pharmacist phone directory can now be found on [email or web address] ([email or web address]).  Listed under [facility].

## 2024-08-25 NOTE — TOC Progression Note (Signed)
 Transition of Care [facility]) - Progression Note    Patient Details  Name: Beth [name] MRN: [id number] Date of Birth: 1946-03-09  Transition of Care [facility]) CM/SW Contact  Isaiah Public, LCSWA Phone Number: 08/25/2024, 11:19 AM  Clinical Narrative:     CSW following to start insurance authorization for SNF closer to patient being medically ready for dc. [name] request for CSW to follow up with facility closer to patient being medically ready on SNF bed availability.CSW will continue to follow.  Expected Discharge Plan: Skilled Nursing Facility Barriers to Discharge: Continued Medical Work up               Expected Discharge Plan and Services                                               Social Drivers of Health (SDOH) Interventions SDOH Screenings   Food Insecurity: No Food Insecurity (08/22/2024)  Housing: Low Risk  (08/22/2024)  Transportation Needs: No Transportation Needs (08/22/2024)  Recent Concern: Transportation Needs - Unmet Transportation Needs (07/20/2024)   Received from [facility]  Utilities: Not At Risk (08/22/2024)  Alcohol Screen: Low Risk  (12/30/2023)  Depression (PHQ2-9): Medium Risk (07/20/2024)  Financial Resource Strain: Low Risk  (07/20/2024)   Received from [facility]  Physical Activity: Inactive (07/20/2024)   Received from [facility]  Social Connections: Moderately Integrated (08/22/2024)  Recent Concern: Social Connections - Socially Isolated (07/20/2024)   Received from [facility]  Stress: No Stress Concern Present (07/20/2024)   Received from [facility]  Tobacco Use: Low Risk  (08/22/2024)  Health Literacy: Low Risk  (07/20/2024)   Received from [facility]    Readmission Risk Interventions     No data to display

## 2024-08-26 DIAGNOSIS — M25422 Effusion, left elbow: Secondary | ICD-10-CM | POA: Diagnosis not present

## 2024-08-26 DIAGNOSIS — K920 Hematemesis: Secondary | ICD-10-CM | POA: Diagnosis not present

## 2024-08-26 LAB — GLUCOSE, CAPILLARY
Glucose-Capillary: 102 mg/dL — ABNORMAL HIGH (ref 70–99)
Glucose-Capillary: 104 mg/dL — ABNORMAL HIGH (ref 70–99)
Glucose-Capillary: 111 mg/dL — ABNORMAL HIGH (ref 70–99)
Glucose-Capillary: 129 mg/dL — ABNORMAL HIGH (ref 70–99)
Glucose-Capillary: 138 mg/dL — ABNORMAL HIGH (ref 70–99)
Glucose-Capillary: 206 mg/dL — ABNORMAL HIGH (ref 70–99)

## 2024-08-26 LAB — BODY FLUID CULTURE W GRAM STAIN: Culture: NO GROWTH

## 2024-08-26 LAB — BASIC METABOLIC PANEL WITH GFR
Anion gap: 9 (ref 5–15)
BUN: 27 mg/dL — ABNORMAL HIGH (ref 8–23)
CO2: 18 mmol/L — ABNORMAL LOW (ref 22–32)
Calcium: 8 mg/dL — ABNORMAL LOW (ref 8.9–10.3)
Chloride: 109 mmol/L (ref 98–111)
Creatinine, Ser: 1.98 mg/dL — ABNORMAL HIGH (ref 0.44–1.00)
GFR, Estimated: 25 mL/min — ABNORMAL LOW (ref >60)
Glucose, Bld: 101 mg/dL — ABNORMAL HIGH (ref 70–99)
Potassium: 3.7 mmol/L (ref 3.5–5.1)
Sodium: 136 mmol/L (ref 135–145)

## 2024-08-26 LAB — VANCOMYCIN, PEAK
Vancomycin Pk: 18 ug/mL — ABNORMAL LOW (ref 30–40)
Vancomycin Pk: 32 ug/mL (ref 30–40)

## 2024-08-26 LAB — CBC
HCT: 28.2 % — ABNORMAL LOW (ref 36.0–46.0)
Hemoglobin: 8.9 g/dL — ABNORMAL LOW (ref 12.0–15.0)
MCH: 26.4 pg (ref 26.0–34.0)
MCHC: 31.6 g/dL (ref 30.0–36.0)
MCV: 83.7 fL (ref 80.0–100.0)
Platelets: 415 K/uL — ABNORMAL HIGH (ref 150–400)
RBC: 3.37 MIL/uL — ABNORMAL LOW (ref 3.87–5.11)
RDW: 16.2 % — ABNORMAL HIGH (ref 11.5–15.5)
WBC: 6.7 K/uL (ref 4.0–10.5)
nRBC: 0 % (ref 0.0–0.2)

## 2024-08-26 LAB — MAGNESIUM: Magnesium: 1.9 mg/dL (ref 1.7–2.4)

## 2024-08-26 MED ORDER — HYDRALAZINE HCL 50 MG PO TABS
50.0000 mg | ORAL_TABLET | Freq: Three times a day (TID) | ORAL | Status: DC
Start: 2024-08-26 — End: 2024-09-02
  Administered 2024-08-26 – 2024-08-31 (×17): 50 mg via ORAL
  Filled 2024-08-26 (×19): qty 1

## 2024-08-26 NOTE — Plan of Care (Signed)
  Problem: Education: Goal: Ability to describe self-care measures that may prevent or decrease complications (Diabetes Survival Skills Education) will improve Outcome: Progressing Goal: Individualized Educational Video(s) Outcome: Progressing   Problem: Coping: Goal: Ability to adjust to condition or change in health will improve Outcome: Progressing   Problem: Fluid Volume: Goal: Ability to maintain a balanced intake and output will improve Outcome: Progressing   Problem: Health Behavior/Discharge Planning: Goal: Ability to identify and utilize available resources and services will improve Outcome: Progressing Goal: Ability to manage health-related needs will improve Outcome: Progressing   Problem: Metabolic: Goal: Ability to maintain appropriate glucose levels will improve Outcome: Progressing   Problem: Nutritional: Goal: Maintenance of adequate nutrition will improve Outcome: Progressing Goal: Progress toward achieving an optimal weight will improve Outcome: Progressing   Problem: Skin Integrity: Goal: Risk for impaired skin integrity will decrease Outcome: Progressing   Problem: Tissue Perfusion: Goal: Adequacy of tissue perfusion will improve Outcome: Progressing   Problem: Clinical Measurements: Goal: Ability to avoid or minimize complications of infection will improve Outcome: Progressing   Problem: Skin Integrity: Goal: Skin integrity will improve Outcome: Progressing   Problem: Education: Goal: Knowledge of General Education information will improve Description: Including pain rating scale, medication(s)/side effects and non-pharmacologic comfort measures Outcome: Progressing   Problem: Health Behavior/Discharge Planning: Goal: Ability to manage health-related needs will improve Outcome: Progressing   Problem: Clinical Measurements: Goal: Ability to maintain clinical measurements within normal limits will improve Outcome: Progressing Goal: Will  remain free from infection Outcome: Progressing Goal: Diagnostic test results will improve Outcome: Progressing Goal: Respiratory complications will improve Outcome: Progressing Goal: Cardiovascular complication will be avoided Outcome: Progressing   Problem: Activity: Goal: Risk for activity intolerance will decrease Outcome: Progressing   Problem: Nutrition: Goal: Adequate nutrition will be maintained Outcome: Progressing   Problem: Coping: Goal: Level of anxiety will decrease Outcome: Progressing   Problem: Elimination: Goal: Will not experience complications related to bowel motility Outcome: Progressing Goal: Will not experience complications related to urinary retention Outcome: Progressing   Problem: Pain Managment: Goal: General experience of comfort will improve and/or be controlled Outcome: Progressing   Problem: Safety: Goal: Ability to remain free from injury will improve Outcome: Progressing   Problem: Skin Integrity: Goal: Risk for impaired skin integrity will decrease Outcome: Progressing

## 2024-08-26 NOTE — Progress Notes (Signed)
 PROGRESS NOTE Beth [name]  FMW:[id number] DOB: 10-26-1946 DOA: 08/21/2024 PCP: Beth Lowers, MD   Brief Narrative:  78 year old with history of recent PE on Eliquis , CKD stage IV, DM 2, HLD, HTN, CVA, diastolic CHF, obesity, gout, infected total right knee replacement, otitis media presented with hematemesis from [facility].  There is also suspicion for left arm cellulitis/septic arthritis therefore orthopedic consulted.  Left elbow aspirated by IR on 9/16.  Can dc back to SNF likely tomorrow.    Assessment & Plan:    Left elbow cellulitis/edema- aspiration negative for gout.  Acute left knee pain and chronic right knee pain -Adamantly refusing any surgical intervention as right knee would require amputation.  Seen by orthopedic who signed off -MRI suggestive of left elbow fluid collection, which was aspirated by IR on 9/16 without evidence of any crystals.  Culture data is pending, continue broad-spectrum antibiotics. - ID following - continue cefepime , flagyl    Elevated troponin - This is chronic.  Patient is not in any chest pain.  Clinically insignificant.  Discussed with cardiology, recommending conservative management and monitoring for now. Dr Beth [name] cleared for Endo eval by GI.  They will be available for official consult if necessary.   Hematemesis, stable -Patient seen by GI. endoscopy performed on 9/15 showing normal esophagus, 4 cm hiatal hernia, some gastritis.  No obvious source of bleeding identified therefore resumed Eliquis . Stopped aspirin    DVT/PE, recent- stable ORA and without chest pain - continue Eliquis    Facial droop with left otitis media/externa Facial nerve root compression and weakness - Had recent herpes breakout, seen by ENT underwent left myringotomy with tube placement on 9/8.  Current recommendations are to complete p.o. valacyclovir , steroid cream and outpatient ENT follow-up. - continue otic cipro  drops until 9/20   Essential hypertension - On  Cardizem , hydralazine , Imdur    Chronic diastolic CHF, EF 34% - Recent echocardiogram shows preserved EF with grade 1 DD   Diabetes mellitus type 2 - Jardiance  - Sliding scale and Accu-Chek.  A1c 6.4   History of CVA with residual deficit - statin and Eliquis     CKD stage IV with slightly elevated anion gap - Creatinine around baseline 2.0, improving   DVT prophylaxis: eliquis      Code Status: Full Code  Family Communication:  son at bedside  Status is: Inpatient Remains inpatient appropriate because: pending ID recommendations   PT Follow up Recs: Skilled Nursing-Short Term Rehab (<3 Hours/Day)08/23/2024 1100. TOC consulted.    Subjective: Seen at bedside.  Tells me she wants to be left alone as people keep coming in the room and disturbing her   Examination:   General exam: Appears calm and comfortable  Respiratory system: Clear to auscultation. Respiratory effort normal. Cardiovascular system: S1 & S2 heard, RRR. No JVD, murmurs, rubs, gallops or clicks. No pedal edema. Gastrointestinal system: Abdomen is nondistended, soft and nontender. No organomegaly or masses felt. Normal bowel sounds heard. Central nervous system: Alert and oriented. No focal neurological deficits. Extremities: Symmetric 5 x 5 power. Skin: No rashes, lesions or ulcers, left elbow dressing in place Psychiatry: Judgement and insight appear normal. Mood & affect appropriate.  Diet Orders (From admission, onward)     Start     Ordered   08/22/24 1020  Diet Carb Modified Fluid consistency: Thin; Room service appropriate? Yes  Diet effective now       Question Answer Comment  Diet-HS Snack? Nothing   Calorie Level Medium 1600-2000   Fluid consistency: Thin  Room service appropriate? Yes      08/22/24 1019            Objective: Vitals:   08/25/24 2012 08/25/24 2322 08/26/24 0400 08/26/24 0800  BP: (!) 161/81 (!) 177/94 (!) 174/87 (!) 170/100  Pulse: 79 79 82 82  Resp: 16 16 16    Temp:  98.2 F (36.8 C) 98.5 F (36.9 C) 98.9 F (37.2 C) 98.1 F (36.7 C)  TempSrc: Oral Oral Oral Oral  SpO2: 95% 94% 100% 97%  Weight:      Height:        Intake/Output Summary (Last 24 hours) at 08/26/2024 0827 Last data filed at 08/26/2024 0403 Gross per 24 hour  Intake 400 ml  Output 1200 ml  Net -800 ml   Filed Weights   08/22/24 0839  Weight: 99.3 kg    Scheduled Meds:  apixaban   5 mg Oral BID   aspirin  EC  81 mg Oral Daily   atorvastatin   40 mg Oral QHS   azelastine   1 spray Each Nare BID   ciprofloxacin -dexamethasone   4 drop Left EAR BID   diltiazem   240 mg Oral Daily   famotidine   20 mg Oral Daily   ferrous sulfate   325 mg Oral Once per day on Monday Thursday   hydrALAZINE   25 mg Oral Q8H   insulin  aspart  0-9 Units Subcutaneous Q4H   isosorbide  mononitrate  60 mg Oral Daily   metroNIDAZOLE   500 mg Oral Q12H   pantoprazole   40 mg Oral QAC breakfast   sodium bicarbonate   650 mg Oral BID   Continuous Infusions:  ceFEPime  (MAXIPIME ) IV 2 g (08/25/24 2113)   vancomycin  1,000 mg (08/25/24 2318)    Nutritional status     Body mass index is 36.43 kg/m.  Data Reviewed:   CBC: Recent Labs  Lab 08/21/24 1438 08/22/24 0530 08/22/24 1946 08/23/24 0607 08/24/24 0528 08/25/24 0458 08/26/24 0459  WBC 8.1   < > 7.1 5.5 5.2 5.3 6.7  NEUTROABS 5.4  --   --   --   --   --   --   HGB 10.2*   < > 9.2* 9.3* 9.2* 8.3* 8.9*  HCT 32.6*   < > 30.4* 30.3* 29.7* 26.5* 28.2*  MCV 85.8   < > 86.9 86.3 85.1 83.9 83.7  PLT 398   < > 369 380 365 371 415*   < > = values in this interval not displayed.   Basic Metabolic Panel: Recent Labs  Lab 08/22/24 1203 08/22/24 1946 08/23/24 0607 08/24/24 0528 08/25/24 0458 08/26/24 0459  NA  --  136 136 137 139 136  K  --  4.3 4.4 4.0 4.1 3.7  CL  --  108 107 104 106 109  CO2  --  19* 19* 16* 16* 18*  GLUCOSE  --  149* 136* 105* 101* 101*  BUN  --  31* 29* 29* 32* 27*  CREATININE  --  2.04* 1.95* 1.98* 1.89* 1.98*  CALCIUM    --  8.3* 8.4* 8.3* 8.2* 8.0*  MG 2.0  --  2.2 2.0 2.0 1.9  PHOS 4.3  --   --   --   --   --     LOS: 5 days   Time spent= 55 mins    Beth LITTIE Piety, MD [facility]  If 7PM-7AM, please contact night-coverage  08/26/2024, 8:27 AM

## 2024-08-26 NOTE — Plan of Care (Signed)
  Problem: Education: Goal: Ability to describe self-care measures that may prevent or decrease complications (Diabetes Survival Skills Education) will improve 08/26/2024 0815 by Mila Fairy SAUNDERS, RN Outcome: Progressing 08/26/2024 0815 by Mila Fairy SAUNDERS, RN Outcome: Progressing Goal: Individualized Educational Video(s) 08/26/2024 0815 by Mila Fairy SAUNDERS, RN Outcome: Progressing 08/26/2024 0815 by Mila Fairy SAUNDERS, RN Outcome: Progressing   Problem: Coping: Goal: Ability to adjust to condition or change in health will improve 08/26/2024 0815 by Mila Fairy SAUNDERS, RN Outcome: Progressing 08/26/2024 0815 by Mila Fairy SAUNDERS, RN Outcome: Progressing   Problem: Fluid Volume: Goal: Ability to maintain a balanced intake and output will improve 08/26/2024 0815 by Mila Fairy SAUNDERS, RN Outcome: Progressing 08/26/2024 0815 by Mila Fairy SAUNDERS, RN Outcome: Progressing   Problem: Health Behavior/Discharge Planning: Goal: Ability to identify and utilize available resources and services will improve 08/26/2024 0815 by Mila Fairy SAUNDERS, RN Outcome: Progressing 08/26/2024 0815 by Mila Fairy SAUNDERS, RN Outcome: Progressing Goal: Ability to manage health-related needs will improve 08/26/2024 0815 by Mila Fairy SAUNDERS, RN Outcome: Progressing 08/26/2024 0815 by Mila Fairy SAUNDERS, RN Outcome: Progressing   Problem: Metabolic: Goal: Ability to maintain appropriate glucose levels will improve 08/26/2024 0815 by Mila Fairy SAUNDERS, RN Outcome: Progressing 08/26/2024 0815 by Mila Fairy SAUNDERS, RN Outcome: Progressing   Problem: Nutritional: Goal: Maintenance of adequate nutrition will improve 08/26/2024 0815 by Mila Fairy SAUNDERS, RN Outcome: Progressing 08/26/2024 0815 by Mila Fairy SAUNDERS, RN Outcome: Progressing Goal: Progress toward achieving an optimal weight will improve 08/26/2024 0815 by Mila Fairy SAUNDERS, RN Outcome: Progressing 08/26/2024 0815 by Mila Fairy SAUNDERS, RN Outcome:  Progressing   Problem: Skin Integrity: Goal: Risk for impaired skin integrity will decrease 08/26/2024 0815 by Mila Fairy SAUNDERS, RN Outcome: Progressing 08/26/2024 0815 by Mila Fairy SAUNDERS, RN Outcome: Progressing   Problem: Tissue Perfusion: Goal: Adequacy of tissue perfusion will improve 08/26/2024 0815 by Mila Fairy SAUNDERS, RN Outcome: Progressing 08/26/2024 0815 by Mila Fairy SAUNDERS, RN Outcome: Progressing   Problem: Clinical Measurements: Goal: Ability to avoid or minimize complications of infection will improve Outcome: Progressing   Problem: Skin Integrity: Goal: Skin integrity will improve Outcome: Progressing   Problem: Education: Goal: Knowledge of General Education information will improve Description: Including pain rating scale, medication(s)/side effects and non-pharmacologic comfort measures Outcome: Progressing   Problem: Health Behavior/Discharge Planning: Goal: Ability to manage health-related needs will improve Outcome: Progressing   Problem: Clinical Measurements: Goal: Ability to maintain clinical measurements within normal limits will improve Outcome: Progressing Goal: Will remain free from infection Outcome: Progressing Goal: Diagnostic test results will improve Outcome: Progressing Goal: Respiratory complications will improve Outcome: Progressing Goal: Cardiovascular complication will be avoided Outcome: Progressing   Problem: Activity: Goal: Risk for activity intolerance will decrease Outcome: Progressing   Problem: Nutrition: Goal: Adequate nutrition will be maintained Outcome: Progressing   Problem: Coping: Goal: Level of anxiety will decrease Outcome: Progressing   Problem: Elimination: Goal: Will not experience complications related to bowel motility Outcome: Progressing Goal: Will not experience complications related to urinary retention Outcome: Progressing   Problem: Pain Managment: Goal: General experience of comfort will  improve and/or be controlled Outcome: Progressing   Problem: Safety: Goal: Ability to remain free from injury will improve Outcome: Progressing   Problem: Skin Integrity: Goal: Risk for impaired skin integrity will decrease Outcome: Progressing

## 2024-08-26 NOTE — Progress Notes (Signed)
 Physical Therapy Treatment Patient Details Name: Beth [name] MRN: [id number] DOB: 06/25/1946 Today's Date: 08/26/2024   History of Present Illness Pt is a 78 y.o. F who presented 08/21/24 due to  hematemesis from [place]. Pt found to have L elbow septic arthritis and endoscopy on 9/15 with no obvious bleeding identified. Plan for possible aspiration with IR of L elbow. 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 Pt with improved initiation and tolerance for bed mobility this date, performed with +1-2 modA (increased assist returning to supine). Pt attempted sit<>stand from elevated bed to locked bari rollator but was not able to maintain static standing due to fatigue/posterior instability and not able to weight shift in stance today. Pt agreeable to hoyer OOB to recliner and instructed on supine/seated LE exercises for strengthening as well as crunches forward in bed/chair to work on UE/core strength building. Pt eager to get OOB to chair daily while in hospital, RN assisted with hoyer transfer and instructed on safety for return to bed from chair via lift. Patient will benefit from continued inpatient follow up therapy, <3 hours/day     If plan is discharge home, recommend the following: A lot of help with bathing/dressing/bathroom;Assistance with feeding;Assist for transportation;Direct supervision/assist for financial management;Assistance with cooking/housework;Direct supervision/assist for medications management;Supervision due to cognitive status;Two people to help with walking and/or transfers   Can travel by private vehicle     No  Equipment Recommendations  None recommended by PT (currently hoyer level)    Recommendations for Other Services        Precautions / Restrictions Precautions Precautions: Fall Recall of Precautions/Restrictions: Impaired Precaution/Restrictions Comments: Rt knee spacer Required Braces or Orthoses: Other Brace Other Brace: soft R knee brace Restrictions Weight Bearing Restrictions Per Provider Order: No     Mobility  Bed Mobility Overal bed mobility: Needs Assistance Bed Mobility: Rolling, Supine to Sit, Sit to Sidelying Rolling: Used rails, Max assist   Supine to sit: Mod assist, HOB elevated, Used rails   Sit to sidelying: Mod assist, +2 for physical assistance, Used rails General bed mobility comments: increased assist rolling toward her R side/rolling far enough for placement of hoyer pad (needs assist to lift one leg over the other but good UE initiation).    Transfers Overall transfer level: Needs assistance Equipment used: Rollator (4 wheels) (locked bari [id number]) Transfers: Sit to/from Stand Sit to Stand: Max assist, Total assist, +2 physical assistance, From elevated surface           General transfer comment: elevated bed<>locked bari rollator, pt able to lift hips up first trial but not transition hands from EOB>4WW. On second trial, pt able to stand from elevated bed>rollator and transfer hands with +2 maxA but upon standing, fatigues quickly and with impulsivity to sit back down vs posterior LOB, totalA for stand>sit. Pt then agreeable to hoyer OOB to recliner chair. Transfer via Lift Equipment: Maximove  Ambulation/Gait               General Gait Details: unable   Stairs             Wheelchair Mobility     Tilt Bed    Modified Rankin (Stroke Patients Only)  Balance Overall balance assessment: Needs assistance Sitting-balance support: No upper extremity supported, Feet supported, Single extremity supported Sitting balance-[name]: Fair Sitting balance - Comments: CGA to close Supervision for static sitting EOB Postural control: Posterior  lean Standing balance support: Bilateral upper extremity supported, Reliant on assistive device for balance Standing balance-[name]: Zero Standing balance comment: +2 max/totalA unable to maintain >5 seconds                            Communication Communication Communication: No apparent difficulties  Cognition Arousal: Alert Behavior During Therapy: WFL for tasks assessed/performed   PT - Cognitive impairments: No apparent impairments, Other                       PT - Cognition Comments: Decreased insight into her deficits, with pt insisting on STS/ambulation, when unable to stand wtih maxA +2 to locked bari RW, pt states I really have to get up and motivated to progress, does agree to [employer] OOB to chair ultimately. Following commands: Intact Following commands impaired: Follows one step commands with increased time    Cueing Cueing Techniques: Verbal cues, Gestural cues  Exercises General Exercises - Lower Extremity Ankle Circles/Pumps: AROM, Both, Supine, 10 reps, Seated (x5 reps ea posture) Long Arc Quad: AROM, AAROM, Left, Right, 5 reps, Seated Hip Flexion/Marching: AROM, Left, Right, 5 reps, Seated Supine BLE AROM: hip IR x 5 reps cues for turn your toes toward the ceiling   General Comments General comments (skin integrity, edema, etc.): purewick re-donned in supine/remains in during lift OOB to chair per patient request      Pertinent Vitals/Pain Pain Assessment Pain Assessment: Faces Faces Pain Scale: Hurts even more Pain Location: R>L knee with AA/AROM, L elbow Pain Descriptors / Indicators: Aching, Discomfort, Grimacing, Guarding Pain Intervention(s): Limited activity within patient's tolerance, Monitored during session, Repositioned    Home Living                          Prior Function            PT Goals (current goals can now be found in the care plan section) Acute Rehab PT Goals Patient Stated Goal: get  stronger PT Goal Formulation: With patient Time For Goal Achievement: 09/06/24 Progress towards PT goals: Progressing toward goals    Frequency    Min 2X/week      PT Plan      Co-evaluation              AM-PAC PT 6 Clicks Mobility   Outcome Measure  Help needed turning from your back to your side while in a flat bed without using bedrails?: A Lot Help needed moving from lying on your back to sitting on the side of a flat bed without using bedrails?: A Lot (pt reports her bed at home has elevating head of bed and rails) Help needed moving to and from a bed to a chair (including a wheelchair)?: Total Help needed standing up from a chair using your arms (e.g., wheelchair or bedside chair)?: Total Help needed to walk in hospital room?: Total Help needed climbing 3-5 steps with a railing? : Total 6 Click Score: 8    End of Session Equipment Utilized During Treatment: Gait belt Activity Tolerance: Patient tolerated treatment well Patient left: in chair;with call bell/phone within reach;with chair alarm set;Other (comment) (lift pad under her hips  in chair, pillows and chair pad between her skin and pad for comfort, purewick in place) Nurse Communication: Mobility status;Need for lift equipment (board updated for pt current assist level (+2/hoyer)) PT Visit Diagnosis: Other abnormalities of gait and mobility (R26.89);Difficulty in walking, not elsewhere classified (R26.2);Muscle weakness (generalized) (M62.81)     Time: 8686-8645 PT Time Calculation (min) (ACUTE ONLY): 41 min  Charges:    $Therapeutic Exercise: 8-22 mins $Therapeutic Activity: 23-37 mins PT General Charges $$ ACUTE PT VISIT: 1 Visit                     Caddie Randle P., PTA Acute Rehabilitation Services Secure Chat Preferred 9a-5:30pm Office: [phone]    Connell HERO [facility] 08/26/2024, 2:56 PM

## 2024-08-26 NOTE — Progress Notes (Signed)
 CHART REVIEWED MRI REVIEWED PT'S NOTES REFLECT PATIENT DOES NOT WANT SURGERY TO DATE NO GROWTH FROM ASPIRATION ON IV ANTIBIOTICS IF PATIENT IMPROVES FROM IV THERAPY WOULD CONTINUE WITH CURRENT CARE. WILL EXAMINE ELBOW TOMORROW AND ASSESS NEED FOR SURGERY WHILE AN INPATIENT.

## 2024-08-26 NOTE — Progress Notes (Signed)
 Pharmacy Antibiotic Note  Beth [name] is a 78 y.o. female with Left elbow septic arthritis. Pharmacy has been consulted for cefepime  and vancomycin  dosing.  -WBC= 6.7, afebrile -SCr 1.98 (recent baseline ~ 2.0) -cultures- ngtd  -vancomycin  peak= 32, trough= 18; calculated AUC= 508  Plan: -Continue vancomycin  -Will follow renal function, cultures and clinical progress   Height: 5' 5 (165.1 cm) Weight: 99.3 kg (218 lb 14.7 oz) IBW/kg (Calculated) : 57  Temp (24hrs), Avg:98.5 F (36.9 C), Min:98.1 F (36.7 C), Max:98.9 F (37.2 C)  Recent Labs  Lab 08/22/24 1946 08/23/24 0607 08/24/24 0528 08/25/24 0458 08/25/24 2325 08/26/24 0459  WBC 7.1 5.5 5.2 5.3  --  6.7  CREATININE 2.04* 1.95* 1.98* 1.89*  --  1.98*  VANCOPEAK  --   --   --   --  18* 32    Estimated Creatinine Clearance: 26.4 mL/min (A) (by C-G formula based on SCr of 1.98 mg/dL (H)).    Allergies  Allergen Reactions   Cefdinir Swelling and Rash    Tolerated cephalosporins many times in the past   Aleve [Naproxen Sodium] Other (See Comments)    Heart races   Dorethia Hitch ] Other (See Comments)    Nose bleeding   Ciprofloxacin  Other (See Comments)    Possible hamstring tendinopathy   Clonidine  Derivatives Other (See Comments)    Dizziness and weakness   Shellfish Allergy Nausea And Vomiting    Antimicrobials this admission: 9/14 Vanc>> 9/14 Cefepime  >> 9/16 flagyl   Dose adjustments this admission:   Microbiology results: 9/15 ARk:whui   Thank you for allowing pharmacy to be a part of this patient's care.  Prentice Poisson, PharmD Clinical Pharmacist **Pharmacist phone directory can now be found on [email or web address] ([email or web address]).  Listed under [facility].

## 2024-08-26 NOTE — TOC Progression Note (Addendum)
 Transition of Care [facility]) - Progression Note    Patient Details  Name: Beth [name] MRN: [id number] Date of Birth: 22-Jul-1946  [facility]) CM/SW Contact  Isaiah Public, LCSWA Phone Number: 08/26/2024, 11:08 AM  Clinical Narrative:     CSW spoke with [name] with [name] and started insurance authorization for SNF and PTAR. Patient has SNF bed at [facility]. Star with [facility] confirmed facility can accept patient over the weekend if medically ready and insurance authorization is approved for SNF.CSW will continue to follow.  Expected Discharge Plan: Skilled Nursing Facility Barriers to Discharge: Continued Medical Work up               Expected Discharge Plan and Services                                               Social Drivers of Health (SDOH) Interventions SDOH Screenings   Food Insecurity: No Food Insecurity (08/22/2024)  Housing: Low Risk  (08/22/2024)  Transportation Needs: No Transportation Needs (08/22/2024)  Recent Concern: Transportation Needs - Unmet Transportation Needs (07/20/2024)   Received [facility]  Utilities: Not At Risk (08/22/2024)  Alcohol Screen: Low Risk  (12/30/2023)  Depression (PHQ2-9): Medium Risk (07/20/2024)  Financial Resource Strain: Low Risk  (07/20/2024)   Received from [facility]  Physical Activity: Inactive (07/20/2024)   Received from [facility]  Social Connections: Moderately Integrated (08/22/2024)  Recent Concern: Social Connections - Socially Isolated (07/20/2024)   Received from [facility]  Stress: No Stress Concern Present (07/20/2024)   Received from [facility]  Tobacco Use: Low Risk  (08/22/2024)  Health Literacy: Low Risk  (07/20/2024)   Received from [facility]    Readmission Risk Interventions     No data to display

## 2024-08-27 DIAGNOSIS — K920 Hematemesis: Secondary | ICD-10-CM | POA: Diagnosis not present

## 2024-08-27 DIAGNOSIS — M25561 Pain in right knee: Secondary | ICD-10-CM | POA: Diagnosis not present

## 2024-08-27 DIAGNOSIS — Z7901 Long term (current) use of anticoagulants: Secondary | ICD-10-CM | POA: Diagnosis not present

## 2024-08-27 LAB — BASIC METABOLIC PANEL WITH GFR
Anion gap: 12 (ref 5–15)
BUN: 26 mg/dL — ABNORMAL HIGH (ref 8–23)
CO2: 17 mmol/L — ABNORMAL LOW (ref 22–32)
Calcium: 8.2 mg/dL — ABNORMAL LOW (ref 8.9–10.3)
Chloride: 106 mmol/L (ref 98–111)
Creatinine, Ser: 1.95 mg/dL — ABNORMAL HIGH (ref 0.44–1.00)
GFR, Estimated: 26 mL/min — ABNORMAL LOW (ref >60)
Glucose, Bld: 110 mg/dL — ABNORMAL HIGH (ref 70–99)
Potassium: 3.6 mmol/L (ref 3.5–5.1)
Sodium: 135 mmol/L (ref 135–145)

## 2024-08-27 LAB — GLUCOSE, CAPILLARY
Glucose-Capillary: 112 mg/dL — ABNORMAL HIGH (ref 70–99)
Glucose-Capillary: 113 mg/dL — ABNORMAL HIGH (ref 70–99)
Glucose-Capillary: 160 mg/dL — ABNORMAL HIGH (ref 70–99)
Glucose-Capillary: 152 mg/dL — ABNORMAL HIGH (ref 70–99)
Glucose-Capillary: 160 mg/dL — ABNORMAL HIGH (ref 70–99)

## 2024-08-27 LAB — CBC
HCT: 28.9 % — ABNORMAL LOW (ref 36.0–46.0)
Hemoglobin: 9.3 g/dL — ABNORMAL LOW (ref 12.0–15.0)
MCH: 26.6 pg (ref 26.0–34.0)
MCHC: 32.2 g/dL (ref 30.0–36.0)
MCV: 82.6 fL (ref 80.0–100.0)
Platelets: 472 K/uL — ABNORMAL HIGH (ref 150–400)
RBC: 3.5 MIL/uL — ABNORMAL LOW (ref 3.87–5.11)
RDW: 16.1 % — ABNORMAL HIGH (ref 11.5–15.5)
WBC: 8.1 K/uL (ref 4.0–10.5)
nRBC: 0 % (ref 0.0–0.2)

## 2024-08-27 LAB — CULTURE, BLOOD (ROUTINE X 2)
Culture: NO GROWTH
Special Requests: ADEQUATE

## 2024-08-27 LAB — MAGNESIUM: Magnesium: 1.9 mg/dL (ref 1.7–2.4)

## 2024-08-27 MED ORDER — CARBAMIDE PEROXIDE 6.5 % OT SOLN
5.0000 [drp] | Freq: Two times a day (BID) | OTIC | Status: DC
  Administered 2024-08-27 – 2024-09-01 (×12): 5 [drp] via OTIC
  Filled 2024-08-27: qty 15

## 2024-08-27 NOTE — Progress Notes (Signed)
 PT SEEN/EXAMINED PTS STUDIES REVIEWED PT WITH ADVANCED ARTHRITIS OF ELBOW PT CLINICALLY HAS LIMITATIONS WITH FULL MOBILITY BUT A PAINLESS ARC OF MOTION CLINICALLY NO EVIDENCE OF INFECTIOUS ARTHRITIS NO SURGERY RECOMMENDED CULTURES NEGATIVE PLEASE CONTACT IF CONDITION WORSENS

## 2024-08-27 NOTE — Progress Notes (Signed)
  Progress Note   Patient: Beth [name] FMW:[id number] DOB: [birth year] DOA: 08/21/2024     6 DOS: the patient was seen and examined on 08/27/2024   Brief hospital course: 78 year old with history of recent PE on Eliquis , CKD stage IV, DM 2, HLD, HTN, CVA, diastolic CHF, obesity, gout, infected total right knee replacement, otitis media presented with hematemesis from [facility]. There is also suspicion for left arm cellulitis/septic arthritis therefore orthopedic consulted. Left elbow aspirated by IR on 9/16.    Assessment and Plan: Left elbow cellulitis/edema- aspiration negative for gout.  Acute left knee pain and chronic right knee pain -Adamantly refusing any surgical intervention as right knee would require amputation.  Seen by orthopedic who signed off -MRI suggestive of left elbow fluid collection, which was aspirated by IR on 9/16 without evidence of any crystals.  Culture data is neg thus far. Continue broad-spectrum antibiotics. - ID following. F/u on abx recommendations - continue cefepime , flagyl    Elevated troponin - This is chronic.  Patient is not in any chest pain.  Clinically insignificant.  Discussed with cardiology, recommending conservative management and monitoring for now. Dr Burton cleared for Endo eval by GI.  They will be available for official consult if necessary.   Hematemesis, stable -Patient seen by GI. endoscopy performed on 9/15 showing normal esophagus, 4 cm hiatal hernia, some gastritis.  No obvious source of bleeding identified therefore resumed Eliquis . Stopped aspirin    DVT/PE, recent- stable ORA and without chest pain - continue Eliquis    Facial droop with left otitis media/externa Facial nerve root compression and weakness - Had recent herpes breakout, seen by ENT underwent left myringotomy with tube placement on 9/8.  Current recommendations are to complete p.o. valacyclovir , steroid cream and outpatient ENT follow-up. - continue otic cipro  drops  until 9/20   Essential hypertension - On Cardizem , hydralazine , Imdur    Chronic diastolic CHF, EF 34% - Recent echocardiogram shows preserved EF with grade 1 DD   Diabetes mellitus type 2 - Jardiance  - Sliding scale and Accu-Chek.  A1c 6.4   History of CVA with residual deficit - statin and Eliquis     CKD stage IV with slightly elevated anion gap - Creatinine around baseline 2.0, improving      Subjective: Without complaints  Physical Exam: Vitals:   08/26/24 1948 08/26/24 2354 08/27/24 0404 08/27/24 0751  BP: (!) 155/90 (!) 166/74 (!) 153/90 (!) 155/99  Pulse: 78 85 81 89  Resp: 16 16 18 19   Temp: 97.8 F (36.6 C) 98.2 F (36.8 C) 98.4 F (36.9 C) 98.4 F (36.9 C)  TempSrc: Oral Oral Oral Oral  SpO2: 97% 97% 96% 99%  Weight:      Height:       General exam: Awake, laying in bed, in nad Respiratory system: Normal respiratory effort, no wheezing Cardiovascular system: regular rate, s1, s2 Gastrointestinal system: Soft, nondistended, positive BS Central nervous system: CN2-12 grossly intact, strength intact Extremities: Perfused, no clubbing Skin: Normal skin turgor, no notable skin lesions seen Psychiatry: Mood normal // no visual hallucinations   Data Reviewed:  Labs reviewed: Na 135, K 3.6, Cr 1.95, WBC 8.1, Hgb 9.3, Plts 472  Family Communication: Pt in room, family at bedside  Disposition: Status is: Inpatient Remains inpatient appropriate because: severity of illness  Planned Discharge Destination: Skilled nursing facility    Author: Garnette Pelt, MD 08/27/2024 5:31 PM  For on call review [email or web address].

## 2024-08-27 NOTE — Plan of Care (Signed)
  Problem: Education: Goal: Ability to describe self-care measures that may prevent or decrease complications (Diabetes Survival Skills Education) will improve Outcome: Progressing Goal: Individualized Educational Video(s) Outcome: Progressing   Problem: Coping: Goal: Ability to adjust to condition or change in health will improve Outcome: Progressing   Problem: Fluid Volume: Goal: Ability to maintain a balanced intake and output will improve Outcome: Progressing   Problem: Health Behavior/Discharge Planning: Goal: Ability to identify and utilize available resources and services will improve Outcome: Progressing Goal: Ability to manage health-related needs will improve Outcome: Progressing   Problem: Metabolic: Goal: Ability to maintain appropriate glucose levels will improve Outcome: Progressing   Problem: Nutritional: Goal: Maintenance of adequate nutrition will improve Outcome: Progressing Goal: Progress toward achieving an optimal weight will improve Outcome: Progressing   Problem: Skin Integrity: Goal: Risk for impaired skin integrity will decrease Outcome: Progressing   Problem: Tissue Perfusion: Goal: Adequacy of tissue perfusion will improve Outcome: Progressing   Problem: Clinical Measurements: Goal: Ability to avoid or minimize complications of infection will improve Outcome: Progressing   Problem: Skin Integrity: Goal: Skin integrity will improve Outcome: Progressing   Problem: Education: Goal: Knowledge of General Education information will improve Description: Including pain rating scale, medication(s)/side effects and non-pharmacologic comfort measures Outcome: Progressing   Problem: Health Behavior/Discharge Planning: Goal: Ability to manage health-related needs will improve Outcome: Progressing   Problem: Clinical Measurements: Goal: Ability to maintain clinical measurements within normal limits will improve Outcome: Progressing Goal: Will  remain free from infection Outcome: Progressing Goal: Diagnostic test results will improve Outcome: Progressing Goal: Respiratory complications will improve Outcome: Progressing Goal: Cardiovascular complication will be avoided Outcome: Progressing   Problem: Activity: Goal: Risk for activity intolerance will decrease Outcome: Progressing   Problem: Nutrition: Goal: Adequate nutrition will be maintained Outcome: Progressing   Problem: Coping: Goal: Level of anxiety will decrease Outcome: Progressing   Problem: Elimination: Goal: Will not experience complications related to bowel motility Outcome: Progressing Goal: Will not experience complications related to urinary retention Outcome: Progressing   Problem: Pain Managment: Goal: General experience of comfort will improve and/or be controlled Outcome: Progressing   Problem: Safety: Goal: Ability to remain free from injury will improve Outcome: Progressing   Problem: Skin Integrity: Goal: Risk for impaired skin integrity will decrease Outcome: Progressing

## 2024-08-27 NOTE — TOC Progression Note (Signed)
 Transition of Care [facility]) - Progression Note    Patient Details  Name: Beth [name] MRN: [id number] Date of Birth: 10/29/1946  Transition of Care [facility]) CM/SW Contact  Montie LOISE Louder, [place] Phone Number: 08/27/2024, 3:37 PM  Clinical Narrative:      Per chart review : pending ID recommendations for IV abx and may need surgery.   Insurance still pending TOC will continue to follow and assist with discharge planning.  Montie Louder, MSW, LCSW Clinical Social Worker    Expected Discharge Plan: Skilled Nursing Facility Barriers to Discharge: Continued Medical Work up               Expected Discharge Plan and Services                                               Social Drivers of Health (SDOH) Interventions SDOH Screenings   Food Insecurity: No Food Insecurity (08/22/2024)  Housing: Low Risk  (08/22/2024)  Transportation Needs: No Transportation Needs (08/22/2024)  Recent Concern: Transportation Needs - Unmet Transportation Needs (07/20/2024)   Received from [facility]  Utilities: Not At Risk (08/22/2024)  Alcohol Screen: Low Risk  (12/30/2023)  Depression (PHQ2-9): Medium Risk (07/20/2024)  Financial Resource Strain: Low Risk  (07/20/2024)   Received from [facility]  Physical Activity: Inactive (07/20/2024)   Received from [facility]  Social Connections: Moderately Integrated (08/22/2024)  Recent Concern: Social Connections - Socially Isolated (07/20/2024)   Received from [facility]  Stress: No Stress Concern Present (07/20/2024)   Received from [facility]  Tobacco Use: Low Risk  (08/22/2024)  Health Literacy: Low Risk  (07/20/2024)   Received from [facility]    Readmission Risk Interventions     No data to display

## 2024-08-28 ENCOUNTER — Ambulatory Visit: Payer: Self-pay | Admitting: Gastroenterology

## 2024-08-28 DIAGNOSIS — K920 Hematemesis: Secondary | ICD-10-CM | POA: Diagnosis not present

## 2024-08-28 DIAGNOSIS — M25561 Pain in right knee: Secondary | ICD-10-CM | POA: Diagnosis not present

## 2024-08-28 DIAGNOSIS — Z7901 Long term (current) use of anticoagulants: Secondary | ICD-10-CM | POA: Diagnosis not present

## 2024-08-28 LAB — BASIC METABOLIC PANEL WITH GFR
Anion gap: 11 (ref 5–15)
BUN: 27 mg/dL — ABNORMAL HIGH (ref 8–23)
CO2: 18 mmol/L — ABNORMAL LOW (ref 22–32)
Calcium: 8.1 mg/dL — ABNORMAL LOW (ref 8.9–10.3)
Chloride: 105 mmol/L (ref 98–111)
Creatinine, Ser: 2 mg/dL — ABNORMAL HIGH (ref 0.44–1.00)
GFR, Estimated: 25 mL/min — ABNORMAL LOW (ref >60)
Glucose, Bld: 128 mg/dL — ABNORMAL HIGH (ref 70–99)
Potassium: 4 mmol/L (ref 3.5–5.1)
Sodium: 134 mmol/L — ABNORMAL LOW (ref 135–145)

## 2024-08-28 LAB — GLUCOSE, CAPILLARY
Glucose-Capillary: 101 mg/dL — ABNORMAL HIGH (ref 70–99)
Glucose-Capillary: 103 mg/dL — ABNORMAL HIGH (ref 70–99)
Glucose-Capillary: 106 mg/dL — ABNORMAL HIGH (ref 70–99)
Glucose-Capillary: 122 mg/dL — ABNORMAL HIGH (ref 70–99)
Glucose-Capillary: 131 mg/dL — ABNORMAL HIGH (ref 70–99)
Glucose-Capillary: 131 mg/dL — ABNORMAL HIGH (ref 70–99)
Glucose-Capillary: 171 mg/dL — ABNORMAL HIGH (ref 70–99)

## 2024-08-28 LAB — CBC
HCT: 29.1 % — ABNORMAL LOW (ref 36.0–46.0)
Hemoglobin: 9.4 g/dL — ABNORMAL LOW (ref 12.0–15.0)
MCH: 26.4 pg (ref 26.0–34.0)
MCHC: 32.3 g/dL (ref 30.0–36.0)
MCV: 81.7 fL (ref 80.0–100.0)
Platelets: 499 K/uL — ABNORMAL HIGH (ref 150–400)
RBC: 3.56 MIL/uL — ABNORMAL LOW (ref 3.87–5.11)
RDW: 16.5 % — ABNORMAL HIGH (ref 11.5–15.5)
WBC: 8.7 K/uL (ref 4.0–10.5)
nRBC: 0 % (ref 0.0–0.2)

## 2024-08-28 LAB — MAGNESIUM: Magnesium: 2 mg/dL (ref 1.7–2.4)

## 2024-08-28 NOTE — TOC Progression Note (Signed)
 Transition of Care [facility]) - Progression Note    Patient Details  Name: Beth [name] MRN: [id number] Date of Birth: 1946-09-08  Transition of Care [facility]) CM/SW Contact  Beth [name], Beth [name] Phone Number: 08/28/2024, 4:18 PM  Clinical Narrative:     Received call form [name]- insurance has denied approval for SNF.  PTAR approved - [id number]  Denial notice provided to patient.  Expected Discharge Plan: Skilled Nursing Facility Barriers to Discharge: Continued Medical Work up               Expected Discharge Plan and Services                                               Social Drivers of Health (SDOH) Interventions SDOH Screenings   Food Insecurity: No Food Insecurity (08/22/2024)  Housing: Low Risk  (08/22/2024)  Transportation Needs: No Transportation Needs (08/22/2024)  Recent Concern: Transportation Needs - Unmet Transportation Needs (07/20/2024)   Received from [facility]  Utilities: Not At Risk (08/22/2024)  Alcohol Screen: Low Risk  (12/30/2023)  Depression (PHQ2-9): Medium Risk (07/20/2024)  Financial Resource Strain: Low Risk  (07/20/2024)   Received from [facility]  Physical Activity: Inactive (07/20/2024)   Received from [facility]  Social Connections: Moderately Integrated (08/22/2024)  Recent Concern: Social Connections - Socially Isolated (07/20/2024)   Received from [facility]  Stress: No Stress Concern Present (07/20/2024)   Received from [facility]  Tobacco Use: Low Risk  (08/22/2024)  Health Literacy: Low Risk  (07/20/2024)   Received from [facility]    Readmission Risk Interventions     No data to display

## 2024-08-28 NOTE — Plan of Care (Signed)
  Problem: Education: Goal: Ability to describe self-care measures that may prevent or decrease complications (Diabetes Survival Skills Education) will improve Outcome: Progressing Goal: Individualized Educational Video(s) Outcome: Progressing   Problem: Coping: Goal: Ability to adjust to condition or change in health will improve Outcome: Progressing   Problem: Fluid Volume: Goal: Ability to maintain a balanced intake and output will improve Outcome: Progressing   Problem: Health Behavior/Discharge Planning: Goal: Ability to identify and utilize available resources and services will improve Outcome: Progressing Goal: Ability to manage health-related needs will improve Outcome: Progressing   Problem: Metabolic: Goal: Ability to maintain appropriate glucose levels will improve Outcome: Progressing   Problem: Nutritional: Goal: Maintenance of adequate nutrition will improve Outcome: Progressing Goal: Progress toward achieving an optimal weight will improve Outcome: Progressing   Problem: Skin Integrity: Goal: Risk for impaired skin integrity will decrease Outcome: Progressing   Problem: Tissue Perfusion: Goal: Adequacy of tissue perfusion will improve Outcome: Progressing   Problem: Clinical Measurements: Goal: Ability to avoid or minimize complications of infection will improve Outcome: Progressing   Problem: Skin Integrity: Goal: Skin integrity will improve Outcome: Progressing   Problem: Education: Goal: Knowledge of General Education information will improve Description: Including pain rating scale, medication(s)/side effects and non-pharmacologic comfort measures Outcome: Progressing   Problem: Health Behavior/Discharge Planning: Goal: Ability to manage health-related needs will improve Outcome: Progressing   Problem: Clinical Measurements: Goal: Ability to maintain clinical measurements within normal limits will improve Outcome: Progressing Goal: Will  remain free from infection Outcome: Progressing Goal: Diagnostic test results will improve Outcome: Progressing Goal: Respiratory complications will improve Outcome: Progressing Goal: Cardiovascular complication will be avoided Outcome: Progressing   Problem: Activity: Goal: Risk for activity intolerance will decrease Outcome: Progressing   Problem: Nutrition: Goal: Adequate nutrition will be maintained Outcome: Progressing   Problem: Coping: Goal: Level of anxiety will decrease Outcome: Progressing   Problem: Elimination: Goal: Will not experience complications related to bowel motility Outcome: Progressing Goal: Will not experience complications related to urinary retention Outcome: Progressing   Problem: Pain Managment: Goal: General experience of comfort will improve and/or be controlled Outcome: Progressing   Problem: Safety: Goal: Ability to remain free from injury will improve Outcome: Progressing   Problem: Skin Integrity: Goal: Risk for impaired skin integrity will decrease Outcome: Progressing

## 2024-08-28 NOTE — Progress Notes (Signed)
 Progress Note   Patient: Beth [name] FMW:[id number] DOB: [birth year] DOA: 08/21/2024     7 DOS: the patient was seen and examined on 08/28/2024   Brief hospital course: 78 year old with history of recent PE on Eliquis , CKD stage IV, DM 2, HLD, HTN, CVA, diastolic CHF, obesity, gout, infected total right knee replacement, otitis media presented with hematemesis from [facility]. There is also suspicion for left arm cellulitis/septic arthritis therefore orthopedic consulted. Left elbow aspirated by IR on 9/16.    Assessment and Plan: Left elbow cellulitis/edema- aspiration negative for gout.  Acute left knee pain and chronic right knee pain -Adamantly refusing any surgical intervention as right knee would require amputation.  Seen by orthopedic who signed off -MRI suggestive of left elbow fluid collection, which was aspirated by IR on 9/16 without evidence of any crystals.  Culture data is neg thus far. Currently continued on IV abx - ID following. F/u on abx recommendations   Elevated troponin - This is chronic.  Patient is not in any chest pain.  Clinically insignificant.  Discussed with cardiology, recommending conservative management and monitoring for now. Dr Burton cleared for Endo eval by GI.  They will be available for official consult if necessary.   Hematemesis, stable -Patient seen by GI. endoscopy performed on 9/15 showing normal esophagus, 4 cm hiatal hernia, some gastritis.  No obvious source of bleeding identified therefore resumed Eliquis . Stopped aspirin    DVT/PE, recent- stable ORA and without chest pain - continue Eliquis    Facial droop with left otitis media/externa Facial nerve root compression and weakness - Had recent herpes breakout, seen by ENT underwent left myringotomy with tube placement on 9/8.  Current recommendations are to complete p.o. valacyclovir , steroid cream and outpatient ENT follow-up. - continue otic cipro  drops until 9/20   Essential  hypertension - On Cardizem , hydralazine , Imdur    Chronic diastolic CHF, EF 34% - Recent echocardiogram shows preserved EF with grade 1 DD   Diabetes mellitus type 2 - Jardiance  - Sliding scale and Accu-Chek.  A1c 6.4   History of CVA with residual deficit - statin and Eliquis   -Patient reports baseline is being able to ambulate into house with walker. See PT note, pt is now deconditioned and, not able to maintain static standing due to fatigue/posterior instability and not able to weight shift in stance     CKD stage IV with slightly elevated anion gap - Creatinine around baseline 2.0 -Cr stable      Subjective: No complaints today  Physical Exam: Vitals:   08/28/24 0632 08/28/24 0809 08/28/24 1145 08/28/24 1551  BP: 130/83 (P) 138/77 (!) 142/91 (!) 143/86  Pulse:  (P) 88 86 76  Resp:  (P) 16 (P) 17 20  Temp:  (P) 98.8 F (37.1 C) (P) 98.3 F (36.8 C) 98.8 F (37.1 C)  TempSrc:  (P) Oral (P) Oral Oral  SpO2:  98% 98% 99%  Weight:      Height:       General exam: Conversant, in no acute distress Respiratory system: normal chest rise, clear, no audible wheezing Cardiovascular system: regular rhythm, s1-s2 Gastrointestinal system: Nondistended, nontender, pos BS Central nervous system: No seizures, no tremors Extremities: No cyanosis, no joint deformities Skin: No rashes, no pallor Psychiatry: Affect normal // no auditory hallucinations   Data Reviewed:  Labs reviewed: Na 134, K 4.0, Cr 2.00, WBC 8.7, Hgb 9.4, Plts 499  Family Communication: Pt in room, family not at bedside  Disposition: Status is: Inpatient Remains  inpatient appropriate because: severity of illness  Planned Discharge Destination: Skilled nursing facility    Author: Garnette Pelt, MD 08/28/2024 4:04 PM  For on call review [email or web address].

## 2024-08-28 NOTE — Treatment Plan (Signed)
 Called NP Channing at [phone] for Peer to Peer at 12:55pm. No answer. Left message that I called.

## 2024-08-28 NOTE — Progress Notes (Signed)
 Beth [name],  The biopsies taken from your stomach were notable for mild chronic gastritis (inflammation) which is a common finding, but there was no evidence of Helicobacter pylori infection, although it is possible that the staining was falsely negative.  If you are having any bothersome abdominal pain or nausea or other GI symptoms, I would recommend we perform stool testing to see if the bacteria is detected.   Please contact our office if you are experiencing any bothersome GI symptoms that may warrant additional testing for H. Pylori.

## 2024-08-29 ENCOUNTER — Other Ambulatory Visit (HOSPITAL_COMMUNITY): Payer: Self-pay

## 2024-08-29 DIAGNOSIS — M25561 Pain in right knee: Secondary | ICD-10-CM | POA: Diagnosis not present

## 2024-08-29 DIAGNOSIS — M00822 Arthritis due to other bacteria, left elbow: Secondary | ICD-10-CM | POA: Diagnosis not present

## 2024-08-29 DIAGNOSIS — K920 Hematemesis: Secondary | ICD-10-CM | POA: Diagnosis not present

## 2024-08-29 DIAGNOSIS — Z7901 Long term (current) use of anticoagulants: Secondary | ICD-10-CM | POA: Diagnosis not present

## 2024-08-29 LAB — BASIC METABOLIC PANEL WITH GFR
Anion gap: 9 (ref 5–15)
BUN: 27 mg/dL — ABNORMAL HIGH (ref 8–23)
CO2: 18 mmol/L — ABNORMAL LOW (ref 22–32)
Calcium: 8 mg/dL — ABNORMAL LOW (ref 8.9–10.3)
Chloride: 107 mmol/L (ref 98–111)
Creatinine, Ser: 2.12 mg/dL — ABNORMAL HIGH (ref 0.44–1.00)
GFR, Estimated: 23 mL/min — ABNORMAL LOW (ref >60)
Glucose, Bld: 93 mg/dL (ref 70–99)
Potassium: 3.7 mmol/L (ref 3.5–5.1)
Sodium: 134 mmol/L — ABNORMAL LOW (ref 135–145)

## 2024-08-29 LAB — GLUCOSE, CAPILLARY
Glucose-Capillary: 140 mg/dL — ABNORMAL HIGH (ref 70–99)
Glucose-Capillary: 155 mg/dL — ABNORMAL HIGH (ref 70–99)
Glucose-Capillary: 157 mg/dL — ABNORMAL HIGH (ref 70–99)
Glucose-Capillary: 187 mg/dL — ABNORMAL HIGH (ref 70–99)
Glucose-Capillary: 98 mg/dL (ref 70–99)
Glucose-Capillary: 99 mg/dL (ref 70–99)

## 2024-08-29 LAB — CBC
HCT: 28 % — ABNORMAL LOW (ref 36.0–46.0)
Hemoglobin: 8.8 g/dL — ABNORMAL LOW (ref 12.0–15.0)
MCH: 26.1 pg (ref 26.0–34.0)
MCHC: 31.4 g/dL (ref 30.0–36.0)
MCV: 83.1 fL (ref 80.0–100.0)
Platelets: 476 K/uL — ABNORMAL HIGH (ref 150–400)
RBC: 3.37 MIL/uL — ABNORMAL LOW (ref 3.87–5.11)
RDW: 16.8 % — ABNORMAL HIGH (ref 11.5–15.5)
WBC: 7.2 K/uL (ref 4.0–10.5)
nRBC: 0.3 % — ABNORMAL HIGH (ref 0.0–0.2)

## 2024-08-29 LAB — MAGNESIUM: Magnesium: 1.9 mg/dL (ref 1.7–2.4)

## 2024-08-29 MED ORDER — AMOXICILLIN-POT CLAVULANATE 500-125 MG PO TABS
1.0000 | ORAL_TABLET | Freq: Two times a day (BID) | ORAL | Status: DC
  Administered 2024-08-29 – 2024-09-01 (×7): 1 via ORAL
  Filled 2024-08-29 (×8): qty 1

## 2024-08-29 MED ORDER — FAMOTIDINE 20 MG PO TABS
10.0000 mg | ORAL_TABLET | Freq: Every day | ORAL | Status: DC
  Administered 2024-08-30 – 2024-09-01 (×3): 10 mg via ORAL
  Filled 2024-08-29 (×3): qty 1

## 2024-08-29 MED ORDER — DOXYCYCLINE HYCLATE 100 MG PO TABS
100.0000 mg | ORAL_TABLET | Freq: Two times a day (BID) | ORAL | Status: DC
  Administered 2024-08-29 – 2024-09-01 (×7): 100 mg via ORAL
  Filled 2024-08-29 (×7): qty 1

## 2024-08-29 MED ORDER — DOXYCYCLINE HYCLATE 100 MG PO TABS
100.0000 mg | ORAL_TABLET | Freq: Two times a day (BID) | ORAL | 0 refills | Status: DC
  Filled 2024-08-29: qty 40, 20d supply, fill #0

## 2024-08-29 MED ORDER — AMOXICILLIN-POT CLAVULANATE 500-125 MG PO TABS
1.0000 | ORAL_TABLET | Freq: Two times a day (BID) | ORAL | 0 refills | Status: DC
  Filled 2024-08-29: qty 40, 20d supply, fill #0

## 2024-08-29 NOTE — Progress Notes (Signed)
 Progress Note   Patient: Beth [name] FMW:[id number] DOB: [birth year] DOA: 08/21/2024     8 DOS: the patient was seen and examined on 08/29/2024   Brief hospital course: 78 year old with history of recent PE on Eliquis , CKD stage IV, DM 2, HLD, HTN, CVA, diastolic CHF, obesity, gout, infected total right knee replacement, otitis media presented with hematemesis from [facility]. There is also suspicion for left arm cellulitis/septic arthritis therefore orthopedic consulted. Left elbow aspirated by IR on 9/16.    Assessment and Plan: Left elbow cellulitis/edema- aspiration negative for gout.  Acute left knee pain and chronic right knee pain -Adamantly refusing any surgical intervention as right knee would require amputation.  Seen by orthopedic who signed off -MRI suggestive of left elbow fluid collection, which was aspirated by IR on 9/16 without evidence of any crystals.  Culture data is neg thus far. Currently continued on IV abx - ID following. Now recs to change to doxycycline  100mg  po BID and augmentin  500/125mg  PO bid for a total of 4 weeks -OK for d/c per ID  Elevated troponin - This is chronic.  Patient is not in any chest pain.  Clinically insignificant.  Discussed with cardiology, recommending conservative management and monitoring for now. Dr Burton cleared for Endo eval by GI.  They will be available for official consult if necessary.   Hematemesis, stable -Patient seen by GI. endoscopy performed on 9/15 showing normal esophagus, 4 cm hiatal hernia, some gastritis.  No obvious source of bleeding identified therefore resumed Eliquis . Stopped aspirin    DVT/PE, recent- stable ORA and without chest pain - continue Eliquis    Facial droop with left otitis media/externa Facial nerve root compression and weakness - Had recent herpes breakout, seen by ENT underwent left myringotomy with tube placement on 9/8.  Current recommendations are to complete p.o. valacyclovir , steroid cream and  outpatient ENT follow-up. - given otic cipro  drops through 9/20   Essential hypertension - On Cardizem , hydralazine , Imdur    Chronic diastolic CHF, EF 34% - Recent echocardiogram shows preserved EF with grade 1 DD   Diabetes mellitus type 2 - Jardiance  - Sliding scale and Accu-Chek.  A1c 6.4   History of CVA with residual deficit - statin and Eliquis   -Patient reports baseline is being able to ambulate into house with walker. See PT note, pt is now deconditioned and, not able to maintain static standing due to fatigue/posterior instability and not able to weight shift in stance   - Therapy recs for SNF. For some reason, insurance has denied - Appeal is currently underway   CKD stage IV with slightly elevated anion gap - Creatinine around baseline 2.0 -Cr stable      Subjective: Without complaints at this time  Physical Exam: Vitals:   08/29/24 0646 08/29/24 0738 08/29/24 1205 08/29/24 1641  BP: 139/84 (!) 151/87 (!) 159/91 119/73  Pulse:  87 84 86  Resp:      Temp:  98.9 F (37.2 C)  98.7 F (37.1 C)  TempSrc:  Oral Oral Oral  SpO2:  97% 97% 97%  Weight:      Height:       General exam: Awake, laying in bed, in nad Respiratory system: Normal respiratory effort, no wheezing Cardiovascular system: regular rate, s1, s2 Gastrointestinal system: Soft, nondistended, positive BS Central nervous system: CN2-12 grossly intact, strength intact Extremities: Perfused, no clubbing Skin: Normal skin turgor, no notable skin lesions seen Psychiatry: Mood normal // no visual hallucinations   Data Reviewed:  Labs reviewed: Na 134, K 3.7, Cr 2.12  Family Communication: Pt in room, family not at bedside  Disposition: Status is: Inpatient Remains inpatient appropriate because: severity of illness  Planned Discharge Destination: Skilled nursing facility    Author: Garnette Pelt, MD 08/29/2024 5:17 PM  For on call review [email or web address].

## 2024-08-29 NOTE — Progress Notes (Addendum)
 Mobility Specialist Progress Note;   08/29/24 1137  Mobility  Activity Mechanically lifted from bed to chair  Level of Assistance Maximum assist, patient does 25-49%  Assistive Device MaxiMove  Activity Response Tolerated well  Mobility Referral Yes  Mobility visit 1 Mobility  Mobility Specialist Start Time (ACUTE ONLY) 1134  Mobility Specialist Stop Time (ACUTE ONLY) 1150  Mobility Specialist Time Calculation (min) (ACUTE ONLY) 16 min   Pt agreeable to mobility. Required MaxA +2 to safely transfer pt from bed to chair via [facility]. VSS throughout. C/o minor back pain throughout although received pain meds. Pt left comfortably in chair with all needs met, call bell in reach. RN notified.   Lauraine Erm Mobility Specialist Please contact via SecureChat or [employer] [phone]

## 2024-08-29 NOTE — Progress Notes (Incomplete)
 Send rx TOC

## 2024-08-29 NOTE — Plan of Care (Signed)
  Problem: Education: Goal: Ability to describe self-care measures that may prevent or decrease complications (Diabetes Survival Skills Education) will improve Outcome: Progressing Goal: Individualized Educational Video(s) Outcome: Progressing   Problem: Coping: Goal: Ability to adjust to condition or change in health will improve Outcome: Progressing   Problem: Fluid Volume: Goal: Ability to maintain a balanced intake and output will improve Outcome: Progressing   Problem: Health Behavior/Discharge Planning: Goal: Ability to identify and utilize available resources and services will improve Outcome: Progressing Goal: Ability to manage health-related needs will improve Outcome: Progressing   Problem: Metabolic: Goal: Ability to maintain appropriate glucose levels will improve Outcome: Progressing   Problem: Nutritional: Goal: Maintenance of adequate nutrition will improve Outcome: Progressing Goal: Progress toward achieving an optimal weight will improve Outcome: Progressing   Problem: Skin Integrity: Goal: Risk for impaired skin integrity will decrease Outcome: Progressing   Problem: Tissue Perfusion: Goal: Adequacy of tissue perfusion will improve Outcome: Progressing   Problem: Clinical Measurements: Goal: Ability to avoid or minimize complications of infection will improve Outcome: Progressing   Problem: Skin Integrity: Goal: Skin integrity will improve Outcome: Progressing   Problem: Education: Goal: Knowledge of General Education information will improve Description: Including pain rating scale, medication(s)/side effects and non-pharmacologic comfort measures Outcome: Progressing   Problem: Health Behavior/Discharge Planning: Goal: Ability to manage health-related needs will improve Outcome: Progressing   Problem: Clinical Measurements: Goal: Ability to maintain clinical measurements within normal limits will improve Outcome: Progressing Goal: Will  remain free from infection Outcome: Progressing Goal: Diagnostic test results will improve Outcome: Progressing Goal: Respiratory complications will improve Outcome: Progressing Goal: Cardiovascular complication will be avoided Outcome: Progressing   Problem: Activity: Goal: Risk for activity intolerance will decrease Outcome: Progressing   Problem: Nutrition: Goal: Adequate nutrition will be maintained Outcome: Progressing   Problem: Coping: Goal: Level of anxiety will decrease Outcome: Progressing   Problem: Elimination: Goal: Will not experience complications related to bowel motility Outcome: Progressing Goal: Will not experience complications related to urinary retention Outcome: Progressing   Problem: Pain Managment: Goal: General experience of comfort will improve and/or be controlled Outcome: Progressing   Problem: Safety: Goal: Ability to remain free from injury will improve Outcome: Progressing   Problem: Skin Integrity: Goal: Risk for impaired skin integrity will decrease Outcome: Progressing

## 2024-08-29 NOTE — Progress Notes (Cosign Needed Addendum)
 [facility]  Date of Admission:  08/21/2024     Reason for Follow Up: Hematemesis with nausea  Total days of antibiotics 8         ASSESSMENT:  Ms. 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   Ms. Guerrier left elbow aspiration cultures finalized without growth to date. No current surgical interventions recommended by Orthopedics. Left elbow appears improved. Tolerating antibiotics with no adverse side effects. Discussed plan of care to change antibiotics to doxycycline  100 mg PO bid and amoxicillin -clavulanate 500/125 mg PO bid for a total of 4 weeks of treatment. Ok for discharge from ID stand point and will follow up in ID clinic. Remaining medical and supportive care per Internal Medicine.   PLAN:  Change antibiotics to doxycycline  100 mg p.o. twice daily and amoxicillin -clavulanate 500/125 mg p.o. twice daily for a total of 4 weeks. Okay for discharge from ID standpoint and will arrange outpatient follow up. Remaining medical and supportive care per Internal Medicine.   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   History of pulmonary embolism   Anticoagulated    amoxicillin -clavulanate  1 tablet  Oral BID   apixaban   5 mg Oral BID   atorvastatin   40 mg Oral QHS   azelastine   1 spray Each Nare BID   carbamide peroxide  5 drop Both EARS BID   ciprofloxacin -dexamethasone   4 drop Left EAR BID   diltiazem   240 mg Oral Daily   doxycycline   100 mg Oral Q12H   [START ON 08/30/2024] famotidine   10 mg Oral Daily   ferrous sulfate   325 mg Oral Once per day on Monday Thursday   hydrALAZINE   50 mg Oral Q8H   insulin  aspart  0-9 Units Subcutaneous Q4H   isosorbide  mononitrate  60 mg Oral Daily   pantoprazole   40 mg Oral QAC breakfast    SUBJECTIVE:  Afebrile overnight with no acute events. Elbow is feeling better and able to move it more.   Allergies  Allergen Reactions   Cefdinir Swelling and Rash    Tolerated cephalosporins many times in the past   Aleve [Naproxen Sodium] Other (See Comments)    Heart races   Dorethia Battle ] Other (See Comments)    Nose bleeding   Ciprofloxacin  Other (See Comments)    Possible hamstring tendinopathy   Clonidine  Derivatives Other (See Comments)    Dizziness and weakness   Shellfish Allergy Nausea And Vomiting     Review of Systems: Review of Systems  Constitutional:  Negative for chills, fever and weight loss.  Respiratory:  Negative for cough, shortness of breath and wheezing.   Cardiovascular:  Negative for chest pain and leg swelling.  Gastrointestinal:  Negative for abdominal pain, constipation, diarrhea, nausea and vomiting.  Skin:  Negative for rash.      OBJECTIVE: Vitals:   08/29/24 0435 08/29/24 0646 08/29/24 0738 08/29/24 1205  BP: (!) 127/92 139/84 (!) 151/87 (!) 159/91  Pulse: 86  87 84  Resp: 18     Temp: 98.6 F (37 C)  98.9 F (37.2 C)   TempSrc: Axillary  Oral Oral  SpO2: 97%  97% 97%  Weight:      Height:       Body mass index is 36.43 kg/m.  Physical Exam Constitutional:      General: She is not in acute distress.    Appearance: She is well-developed.     Comments: Seated in the chair beside the bed;  pleasant.   Cardiovascular:     Rate and Rhythm: Normal rate and regular rhythm.     Heart sounds: Normal heart sounds.  Pulmonary:     Effort: Pulmonary effort is normal.     Breath sounds: Normal breath sounds.  Skin:    General: Skin is warm and dry.  Neurological:     Mental Status: She is alert and oriented to person, place, and time.  Psychiatric:        Mood and Affect: Mood normal.     Lab Results Lab Results  Component Value Date   WBC 7.2 08/29/2024   HGB 8.8 (L) 08/29/2024   HCT 28.0 (L) 08/29/2024   MCV 83.1 08/29/2024   PLT 476 (H) 08/29/2024    Lab Results  Component Value Date   CREATININE 2.12 (H) 08/29/2024   BUN 27 (H) 08/29/2024   NA 134 (L) 08/29/2024   K 3.7 08/29/2024   CL 107 08/29/2024   CO2 18 (L) 08/29/2024    Lab Results  Component Value Date   ALT 14 08/22/2024   AST 17 08/22/2024   ALKPHOS 80 08/22/2024   BILITOT 0.9 08/22/2024     Microbiology: Recent Results (from the past 240 hours)  Resp panel by RT-PCR (RSV, Flu A&B, Covid) Anterior Nasal Swab     Status: None   Collection Time: 08/21/24  2:38 PM   Specimen: Anterior Nasal Swab  Result Value Ref Range Status   SARS Coronavirus 2 by RT PCR NEGATIVE NEGATIVE Final   Influenza A by PCR NEGATIVE NEGATIVE Final   Influenza B by PCR NEGATIVE NEGATIVE Final    Comment: (NOTE) The Xpert Xpress SARS-CoV-2/FLU/RSV plus assay is intended as an aid in the diagnosis of influenza from Nasopharyngeal swab specimens and should not be used as a sole basis for treatment. Nasal washings and aspirates are unacceptable for Xpert Xpress SARS-CoV-2/FLU/RSV testing.  Fact Sheet for Patients: BloggerCourse.com  Fact Sheet for Healthcare Providers: SeriousBroker.it  This test is not yet approved or cleared by the [place]  FDA and has been authorized for detection and/or diagnosis of SARS-CoV-2 by FDA under an Emergency Use Authorization  (EUA). This EUA will remain in effect (meaning this test can be used) for the duration of the COVID-19 declaration under Section 564(b)(1) of the Act, 21 U.S.C. section 360bbb-3(b)(1), unless the authorization is terminated or revoked.     Resp Syncytial Virus by PCR NEGATIVE NEGATIVE Final    Comment: (NOTE) Fact Sheet for Patients: BloggerCourse.com  Fact Sheet for Healthcare Providers: SeriousBroker.it  This test is not yet approved or cleared by the [place]  FDA and has been authorized for detection and/or diagnosis of SARS-CoV-2 by FDA under an Emergency Use  Authorization (EUA). This EUA will remain in effect (meaning this test can be used) for the duration of the COVID-19 declaration under Section 564(b)(1) of the Act, 21 U.S.C. section 360bbb-3(b)(1), unless the authorization is terminated or revoked.  Performed at [facility], [address], [place], [place] [address]   Culture, blood (Routine X 2) w Reflex to ID Panel     Status: None   Collection Time: 08/22/24  4:36 AM   Specimen: BLOOD  Result Value Ref Range Status   Specimen Description BLOOD SITE NOT SPECIFIED  Final   Special Requests   Final    BOTTLES DRAWN AEROBIC AND ANAEROBIC Blood Culture adequate volume   Culture   Final    NO GROWTH 5 DAYS Performed at [facility], [address] [place], [place] [address]    Report Status 08/27/2024 FINAL  Final  Body fluid culture w Gram Stain     Status: None   Collection Time: 08/23/24  4:12 PM   Specimen: Joint, Left Elbow; Body Fluid  Result Value Ref Range Status   Specimen Description SYNOVIAL  Final   Special Requests LEFT ELBOW  Final   Gram Stain   Final    FEW WBC PRESENT, PREDOMINANTLY PMN NO ORGANISMS SEEN    Culture   Final    NO GROWTH 3 DAYS Performed at [facility], [address], [place], [place] [address]    Report Status 08/26/2024 FINAL  Final     Cathlyn July, NP [facility]  08/29/2024  2:24 PM

## 2024-08-29 NOTE — TOC Progression Note (Addendum)
 Transition of Care [facility]) - Progression Note    Patient Details  Name: Beth [name] MRN: [id number] Date of Birth: September 16, 1946  [facility] of [facility]) CM/SW Contact  Isaiah Public, LCSWA Phone Number: 08/29/2024, 10:57 AM  Clinical Narrative:     CSW spoke with [name] with [name] who confirmed patients insurance authorization for SNF was denied. Denial letter was delivered to patients room. CSW spoke with patients daughter who confirmed that patient wants to appeal SNF. CSW informed patients daughter that appeal information is in the denial letter from [name].Patients daughter plans on starting appeal this afternoon. Patients daughter confirmed she will follow up with CSW on any new information , and to confirm that appeal was started. Patient and patients daughter gave permission for CSW to reach out to financial counseling to screen patient for Medicaid. CSW emailed saprese with financial counseling and request for patient to be screened for Medicaid. CSW will continue to follow.  Expected Discharge Plan: Skilled Nursing Facility Barriers to Discharge: Continued Medical Work up               Expected Discharge Plan and Services                                               Social Drivers of Health (SDOH) Interventions SDOH Screenings   Food Insecurity: No Food Insecurity (08/22/2024)  Housing: Low Risk  (08/22/2024)  Transportation Needs: No Transportation Needs (08/22/2024)  Recent Concern: Transportation Needs - Unmet Transportation Needs (07/20/2024)   Received from [facility]  Utilities: Not At Risk (08/22/2024)  Alcohol Screen: Low Risk  (12/30/2023)  Depression (PHQ2-9): Medium Risk (07/20/2024)  Financial Resource Strain: Low Risk  (07/20/2024)   Received from [facility]  Physical Activity: Inactive (07/20/2024)   Received from [facility]  Social Connections: Moderately Integrated (08/22/2024)  Recent Concern: Social Connections - Socially Isolated  (07/20/2024)   Received from [facility]  Stress: No Stress Concern Present (07/20/2024)   Received from [facility]  Tobacco Use: Low Risk  (08/22/2024)  Health Literacy: Low Risk  (07/20/2024)   Received from [facility]    Readmission Risk Interventions     No data to display

## 2024-08-30 DIAGNOSIS — K920 Hematemesis: Secondary | ICD-10-CM | POA: Diagnosis not present

## 2024-08-30 DIAGNOSIS — M25561 Pain in right knee: Secondary | ICD-10-CM | POA: Diagnosis not present

## 2024-08-30 DIAGNOSIS — Z7901 Long term (current) use of anticoagulants: Secondary | ICD-10-CM | POA: Diagnosis not present

## 2024-08-30 LAB — GLUCOSE, CAPILLARY
Glucose-Capillary: 145 mg/dL — ABNORMAL HIGH (ref 70–99)
Glucose-Capillary: 164 mg/dL — ABNORMAL HIGH (ref 70–99)
Glucose-Capillary: 193 mg/dL — ABNORMAL HIGH (ref 70–99)
Glucose-Capillary: 223 mg/dL — ABNORMAL HIGH (ref 70–99)

## 2024-08-30 MED ORDER — DICLOFENAC SODIUM 1 % EX GEL
2.0000 g | Freq: Four times a day (QID) | CUTANEOUS | Status: DC | PRN
  Administered 2024-08-30 (×2): 2 g via TOPICAL
  Filled 2024-08-30: qty 100

## 2024-08-30 MED ORDER — ENSURE PLUS HIGH PROTEIN PO LIQD
237.0000 mL | Freq: Two times a day (BID) | ORAL | Status: DC
  Administered 2024-08-30 – 2024-09-01 (×5): 237 mL via ORAL

## 2024-08-30 NOTE — Plan of Care (Signed)
  Problem: Education: Goal: Ability to describe self-care measures that may prevent or decrease complications (Diabetes Survival Skills Education) will improve Outcome: Progressing Goal: Individualized Educational Video(s) Outcome: Progressing   Problem: Coping: Goal: Ability to adjust to condition or change in health will improve Outcome: Progressing   Problem: Fluid Volume: Goal: Ability to maintain a balanced intake and output will improve Outcome: Progressing   Problem: Health Behavior/Discharge Planning: Goal: Ability to identify and utilize available resources and services will improve Outcome: Progressing Goal: Ability to manage health-related needs will improve Outcome: Progressing   Problem: Metabolic: Goal: Ability to maintain appropriate glucose levels will improve Outcome: Progressing   Problem: Nutritional: Goal: Maintenance of adequate nutrition will improve Outcome: Progressing Goal: Progress toward achieving an optimal weight will improve Outcome: Progressing   Problem: Skin Integrity: Goal: Risk for impaired skin integrity will decrease Outcome: Progressing   Problem: Tissue Perfusion: Goal: Adequacy of tissue perfusion will improve Outcome: Progressing   Problem: Clinical Measurements: Goal: Ability to avoid or minimize complications of infection will improve Outcome: Progressing   Problem: Skin Integrity: Goal: Skin integrity will improve Outcome: Progressing   Problem: Education: Goal: Knowledge of General Education information will improve Description: Including pain rating scale, medication(s)/side effects and non-pharmacologic comfort measures Outcome: Progressing   Problem: Health Behavior/Discharge Planning: Goal: Ability to manage health-related needs will improve Outcome: Progressing   Problem: Clinical Measurements: Goal: Ability to maintain clinical measurements within normal limits will improve Outcome: Progressing Goal: Will  remain free from infection Outcome: Progressing Goal: Diagnostic test results will improve Outcome: Progressing Goal: Respiratory complications will improve Outcome: Progressing Goal: Cardiovascular complication will be avoided Outcome: Progressing   Problem: Activity: Goal: Risk for activity intolerance will decrease Outcome: Progressing   Problem: Nutrition: Goal: Adequate nutrition will be maintained Outcome: Progressing   Problem: Coping: Goal: Level of anxiety will decrease Outcome: Progressing   Problem: Elimination: Goal: Will not experience complications related to bowel motility Outcome: Progressing Goal: Will not experience complications related to urinary retention Outcome: Progressing   Problem: Pain Managment: Goal: General experience of comfort will improve and/or be controlled Outcome: Progressing   Problem: Safety: Goal: Ability to remain free from injury will improve Outcome: Progressing   Problem: Skin Integrity: Goal: Risk for impaired skin integrity will decrease Outcome: Progressing

## 2024-08-30 NOTE — Plan of Care (Signed)
  Problem: Education: Goal: Ability to describe self-care measures that may prevent or decrease complications (Diabetes Survival Skills Education) will improve Outcome: Progressing   Problem: Coping: Goal: Ability to adjust to condition or change in health will improve Outcome: Progressing   Problem: Fluid Volume: Goal: Ability to maintain a balanced intake and output will improve Outcome: Progressing   Problem: Health Behavior/Discharge Planning: Goal: Ability to identify and utilize available resources and services will improve Outcome: Progressing Goal: Ability to manage health-related needs will improve Outcome: Progressing   Problem: Metabolic: Goal: Ability to maintain appropriate glucose levels will improve Outcome: Progressing   Problem: Nutritional: Goal: Maintenance of adequate nutrition will improve Outcome: Progressing   Problem: Skin Integrity: Goal: Risk for impaired skin integrity will decrease Outcome: Progressing   Problem: Tissue Perfusion: Goal: Adequacy of tissue perfusion will improve Outcome: Progressing   Problem: Clinical Measurements: Goal: Ability to avoid or minimize complications of infection will improve Outcome: Progressing   Problem: Skin Integrity: Goal: Skin integrity will improve Outcome: Progressing   Problem: Education: Goal: Knowledge of General Education information will improve Description: Including pain rating scale, medication(s)/side effects and non-pharmacologic comfort measures Outcome: Progressing   Problem: Clinical Measurements: Goal: Ability to maintain clinical measurements within normal limits will improve Outcome: Progressing Goal: Respiratory complications will improve Outcome: Progressing Goal: Cardiovascular complication will be avoided Outcome: Progressing   Problem: Activity: Goal: Risk for activity intolerance will decrease Outcome: Progressing   Problem: Nutrition: Goal: Adequate nutrition will be  maintained Outcome: Progressing   Problem: Coping: Goal: Level of anxiety will decrease Outcome: Progressing   Problem: Elimination: Goal: Will not experience complications related to bowel motility Outcome: Progressing Goal: Will not experience complications related to urinary retention Outcome: Progressing   Problem: Pain Managment: Goal: General experience of comfort will improve and/or be controlled Outcome: Progressing   Problem: Safety: Goal: Ability to remain free from injury will improve Outcome: Progressing   Problem: Skin Integrity: Goal: Risk for impaired skin integrity will decrease Outcome: Progressing

## 2024-08-30 NOTE — Progress Notes (Signed)
 Physical Therapy Treatment Patient Details Name: Beth [name] MRN: [id number] DOB: 09-13-1946 Today's Date: 08/30/2024   History of Present Illness Pt is a 78 y.o. F who presented 08/21/24 due to  hematemesis from [place]. Pt found to have L elbow septic arthritis and endoscopy on 9/15 with no obvious bleeding identified. -MRI suggestive of left elbow fluid collection, which was aspirated by IR on 9/16 without evidence of any crystals, pt on broad spectrum abx; cultures negative. PMhx: HTN, HLD, dCHF, CVA, T2DM, CKD, Rt TKA with infection requiring removal of prosthesis x 2, PE, L myringotomy and tube placement (08/15/2024)    PT Comments  Pt received in supine, agreeable to therapy session, with good participation as able. Pt able to transfer to EOB with heavy +2 modA, posterior instability with static/dynamic seated tasks needing CGA to modA for seated balance. BP stable supine to/from sitting and SpO2/HR WFL on RA. Pt attempted STS but unable to deweight hips fully or transfer both hands to RW with sit<>stand trial, posterior instability and decreased anterior weight shift noted. Pt currently would need +2 physical assist and hoyer for OOB mobility, at baseline pt able to stand/ambulate short distances with +1 assist prior to recent SNF admission. Patient will benefit from continued inpatient follow up therapy, <3 hours/day, pt remains below her functional baseline.    If plan is discharge home, recommend the following: A lot of help with bathing/dressing/bathroom;Assistance with feeding;Assist for transportation;Direct supervision/assist for financial management;Assistance with cooking/housework;Direct supervision/assist for medications management;Supervision due to cognitive status;Two people to help with walking and/or transfers   Can travel by private vehicle     No  Equipment Recommendations  None recommended by PT (currently hoyer level)    Recommendations for Other Services        Precautions / Restrictions Precautions Precautions: Fall Recall of Precautions/Restrictions: Impaired Precaution/Restrictions Comments: Rt knee spacer Required Braces or Orthoses: Other Brace Other Brace: soft R knee brace Restrictions Weight Bearing Restrictions Per Provider Order: No     Mobility  Bed Mobility Overal bed mobility: Needs Assistance Bed Mobility: Rolling, Sidelying to Sit, Sit to Sidelying Rolling: Used rails, Max assist Sidelying to sit: Mod assist, +2 for physical assistance, HOB elevated, Used rails     Sit to sidelying: Mod assist, +2 for physical assistance, Used rails, HOB elevated General bed mobility comments: Rolling to L/R EOB to remove/replace bed pads. log roll to R EOB for comfort with sitting up due to pt c/o back pain. Posterior instability sitting EOB, needs constant trunk support/cues for body mechanics to prevent posterior LOB and UE support.    Transfers Overall transfer level: Needs assistance Equipment used: Rolling walker (2 wheels) Transfers: Sit to/from Stand Sit to Stand: Total assist, +2 physical assistance, From elevated surface           General transfer comment: from EOB<>RW trial x1, pt unable to lift hips fully off EOB even with +2 lift assist, gait belt and bed pad. Pt reports pain too severe in her back after trial and requests return to supine. NT present for pt/staff safety as +2 to assist. Pt too fatigued after return to supine to try hoyer OOB, agreeable to attempt next date.    Ambulation/Gait               General Gait Details: unable   Stairs             Wheelchair Mobility     Tilt Bed    Modified Rankin (Stroke  Patients Only)       Balance Overall balance assessment: Needs assistance Sitting-balance support: No upper extremity supported, Feet supported, Single extremity supported Sitting balance-[name]: Poor Sitting balance - Comments: initially modA progressing to CGA, posterior  instability Postural control: Posterior lean   Standing balance-[name]: Zero Standing balance comment: unable to lift hips fully off bed this date, pt did make good effort but pain increased and quick to fatigue today.                            Communication Communication Communication: No apparent difficulties  Cognition Arousal: Alert Behavior During Therapy: WFL for tasks assessed/performed   PT - Cognitive impairments: No apparent impairments, Memory, Sequencing, Problem solving, Safety/Judgement                       PT - Cognition Comments: Pt asking for pain meds, however per daughter who was present she had pain meds recently prior to session (both topical to her R knee and PO meds). Pt benefits from multimodal cues for safety with body mechanics getting to/from EOB. Following commands: Intact Following commands impaired: Follows one step commands with increased time    Cueing Cueing Techniques: Verbal cues, Gestural cues, Tactile cues  Exercises General Exercises - Lower Extremity Ankle Circles/Pumps: AROM, Both, 5 reps, Supine Long Arc Quad: AROM, Left, Right, Seated, 10 reps Hip ABduction/ADduction: AROM, Both, Seated, 10 reps (pillow squeezes) Hip Flexion/Marching: AROM, Left, Right, 5 reps, Seated    General Comments General comments (skin integrity, edema, etc.): BP 110/69 HR 80 supine prior to EOB; BP 125/82 sitting EOB taken in RUE (poor pleth signal so can't get HR); SpO2 98% in supine when good pleth signal achieved, HR 70's-80's bpm. NT present and changed linens while pt sitting EOB for pt energy conservation      Pertinent Vitals/Pain Pain Assessment Pain Assessment: Faces Faces Pain Scale: Hurts even more Pain Location: R>L knee with AA/AROM, back with sitting Pain Descriptors / Indicators: Aching, Discomfort, Grimacing, Guarding, Sharp Pain Intervention(s): Limited activity within patient's tolerance, Monitored during session,  Premedicated before session, Repositioned (back pain most severe sitting EOB, improves with rest to mild score)    Home Living                          Prior Function            PT Goals (current goals can now be found in the care plan section) Acute Rehab PT Goals Patient Stated Goal: get stronger PT Goal Formulation: With patient Time For Goal Achievement: 09/06/24 Progress towards PT goals: Progressing toward goals    Frequency    Min 2X/week      PT Plan      Co-evaluation              AM-PAC PT 6 Clicks Mobility   Outcome Measure  Help needed turning from your back to your side while in a flat bed without using bedrails?: A Lot Help needed moving from lying on your back to sitting on the side of a flat bed without using bedrails?: A Lot (pt reports her bed at home has elevating head of bed and rails) Help needed moving to and from a bed to a chair (including a wheelchair)?: Total Help needed standing up from a chair using your arms (e.g., wheelchair or bedside chair)?: Total Help  needed to walk in hospital room?: Total Help needed climbing 3-5 steps with a railing? : Total 6 Click Score: 8    End of Session Equipment Utilized During Treatment: Gait belt Activity Tolerance: Patient tolerated treatment well;Patient limited by pain Patient left: with call bell/phone within reach;in bed;with bed alarm set Nurse Communication: Mobility status;Need for lift equipment (needs hoyer for OOB to chair when pain allows) PT Visit Diagnosis: Other abnormalities of gait and mobility (R26.89);Difficulty in walking, not elsewhere classified (R26.2);Muscle weakness (generalized) (M62.81)     Time: 8295-8266 PT Time Calculation (min) (ACUTE ONLY): 29 min  Charges:    $Therapeutic Exercise: 8-22 mins $Therapeutic Activity: 8-22 mins PT General Charges $$ ACUTE PT VISIT: 1 Visit                     Jemari Hallum P., PTA Acute Rehabilitation Services Secure  Chat Preferred 9a-5:30pm Office: [phone]    Connell HERO [facility] 08/30/2024, 5:56 PM

## 2024-08-30 NOTE — TOC Progression Note (Signed)
 Transition of Care [facility]) - Progression Note    Patient Details  Name: Beth [name] MRN: [id number] Date of Birth: 12-22-1945  [facility]) CM/SW Contact  Isaiah Public, LCSWA Phone Number: 08/30/2024, 10:18 AM  Clinical Narrative:     CSW received a call from patients daughter [name] who informed CSW that her brother started the appeal for patient. [name] informed CSW that insurance informed her brother appeal determination can take up to 72 hours. CSW will continue to follow.   Expected Discharge Plan: Skilled Nursing Facility Barriers to Discharge: Continued Medical Work up               Expected Discharge Plan and Services                                               Social Drivers of Health (SDOH) Interventions SDOH Screenings   Food Insecurity: No Food Insecurity (08/22/2024)  Housing: Low Risk  (08/22/2024)  Transportation Needs: No Transportation Needs (08/22/2024)  Recent Concern: Transportation Needs - Unmet Transportation Needs (07/20/2024)   Received from [facility]  Utilities: Not At Risk (08/22/2024)  Alcohol Screen: Low Risk  (12/30/2023)  Depression (PHQ2-9): Medium Risk (07/20/2024)  Financial Resource Strain: Low Risk  (07/20/2024)   Received from [facility]  Physical Activity: Inactive (07/20/2024)   Received from [facility]  Social Connections: Moderately Integrated (08/22/2024)  Recent Concern: Social Connections - Socially Isolated (07/20/2024)   Received from [facility]  Stress: No Stress Concern Present (07/20/2024)   Received from [facility]  Tobacco Use: Low Risk  (08/22/2024)  Health Literacy: Low Risk  (07/20/2024)   Received from [facility]    Readmission Risk Interventions     No data to display

## 2024-08-30 NOTE — Progress Notes (Signed)
 Progress Note   Patient: Beth [name] FMW:[id number] DOB: 1946/10/28 DOA: 08/21/2024     9 DOS: the patient was seen and examined on 08/30/2024   Brief hospital course: 78 year old with history of recent PE on Eliquis , CKD stage IV, DM 2, HLD, HTN, CVA, diastolic CHF, obesity, gout, infected total right knee replacement, otitis media presented with hematemesis from [facility]. There is also suspicion for left arm cellulitis/septic arthritis therefore orthopedic consulted. Left elbow aspirated by IR on 9/16.    Assessment and Plan: Left elbow cellulitis/edema- aspiration negative for gout.  Acute left knee pain and chronic right knee pain -Adamantly refusing any surgical intervention as right knee would require amputation.  Seen by orthopedic who signed off -MRI suggestive of left elbow fluid collection, which was aspirated by IR on 9/16 without evidence of any crystals.  Culture data is neg thus far. Currently continued on IV abx - ID following. Now recs to change to doxycycline  100mg  po BID and augmentin  500/125mg  PO bid for a total of 4 weeks -OK for d/c per ID. Pending SNF. Insurance denied. Currently awaiting appeal   Elevated troponin - This is chronic.  Patient is not in any chest pain.  Clinically insignificant.  Discussed with cardiology, recommending conservative management and monitoring for now. Dr Burton cleared for Endo eval by GI.  They will be available for official consult if necessary.   Hematemesis, stable -Patient seen by GI. endoscopy performed on 9/15 showing normal esophagus, 4 cm hiatal hernia, some gastritis.  No obvious source of bleeding identified therefore resumed Eliquis . Stopped aspirin    DVT/PE, recent- stable ORA and without chest pain - continue Eliquis    Facial droop with left otitis media/externa Facial nerve root compression and weakness - Had recent herpes breakout, seen by ENT underwent left myringotomy with tube placement on 9/8.  Current  recommendations are to complete p.o. valacyclovir , steroid cream and outpatient ENT follow-up. - given otic cipro  drops through 9/20   Essential hypertension - On Cardizem , hydralazine , Imdur    Chronic diastolic CHF, EF 34% - Recent echocardiogram shows preserved EF with grade 1 DD   Diabetes mellitus type 2 - Jardiance  - Sliding scale and Accu-Chek.  A1c 6.4   History of CVA with residual deficit - statin and Eliquis   -Patient reports baseline is being able to ambulate into house with walker. See PT note, pt is now deconditioned and, not able to maintain static standing due to fatigue/posterior instability and not able to weight shift in stance   - Therapy recs for SNF.Insurance denied - Appeal is currently underway   CKD stage IV with slightly elevated anion gap - Creatinine around baseline 2.0 -Cr stable      Subjective: without complaints  Physical Exam: Vitals:   08/30/24 1120 08/30/24 1502 08/30/24 1713 08/30/24 1725  BP: (!) 121/96 (!) 100/50 110/69 125/82  Pulse:    89  Resp: 17 16 18    Temp: 98.6 F (37 C) 97.7 F (36.5 C) 97.7 F (36.5 C)   TempSrc: Oral Oral Oral   SpO2:   93% 98%  Weight:      Height:       General exam: Conversant, in no acute distress Respiratory system: normal chest rise, clear, no audible wheezing Cardiovascular system: regular rhythm, s1-s2 Gastrointestinal system: Nondistended, nontender, pos BS Central nervous system: No seizures, no tremors Extremities: No cyanosis, no joint deformities Skin: No rashes, no pallor Psychiatry: Affect normal // no auditory hallucinations   Data Reviewed:  There are no new results to review at this time.  Family Communication: Pt in room, family not at bedside  Disposition: Status is: Inpatient Remains inpatient appropriate because: severity of illness  Planned Discharge Destination: Skilled nursing facility    Author: Garnette Pelt, MD 08/30/2024 5:45 PM  For on call review  [email or web address].

## 2024-08-31 DIAGNOSIS — Z7189 Other specified counseling: Secondary | ICD-10-CM | POA: Diagnosis not present

## 2024-08-31 DIAGNOSIS — Z515 Encounter for palliative care: Secondary | ICD-10-CM | POA: Diagnosis not present

## 2024-08-31 DIAGNOSIS — K920 Hematemesis: Secondary | ICD-10-CM | POA: Diagnosis not present

## 2024-08-31 LAB — COMPREHENSIVE METABOLIC PANEL WITH GFR
ALT: 10 U/L (ref 0–44)
AST: 16 U/L (ref 15–41)
Albumin: 1.7 g/dL — ABNORMAL LOW (ref 3.5–5.0)
Alkaline Phosphatase: 80 U/L (ref 38–126)
Anion gap: 11 (ref 5–15)
BUN: 45 mg/dL — ABNORMAL HIGH (ref 8–23)
CO2: 17 mmol/L — ABNORMAL LOW (ref 22–32)
Calcium: 7.9 mg/dL — ABNORMAL LOW (ref 8.9–10.3)
Chloride: 104 mmol/L (ref 98–111)
Creatinine, Ser: 3.53 mg/dL — ABNORMAL HIGH (ref 0.44–1.00)
GFR, Estimated: 13 mL/min — ABNORMAL LOW (ref >60)
Glucose, Bld: 150 mg/dL — ABNORMAL HIGH (ref 70–99)
Potassium: 4.6 mmol/L (ref 3.5–5.1)
Sodium: 132 mmol/L — ABNORMAL LOW (ref 135–145)
Total Bilirubin: 0.7 mg/dL (ref 0.0–1.2)
Total Protein: 6.6 g/dL (ref 6.5–8.1)

## 2024-08-31 LAB — CBC
HCT: 29.2 % — ABNORMAL LOW (ref 36.0–46.0)
Hemoglobin: 9.2 g/dL — ABNORMAL LOW (ref 12.0–15.0)
MCH: 26.2 pg (ref 26.0–34.0)
MCHC: 31.5 g/dL (ref 30.0–36.0)
MCV: 83.2 fL (ref 80.0–100.0)
Platelets: 545 10*3/uL — ABNORMAL HIGH (ref 150–400)
RBC: 3.51 MIL/uL — ABNORMAL LOW (ref 3.87–5.11)
RDW: 17.5 % — ABNORMAL HIGH (ref 11.5–15.5)
WBC: 21.4 10*3/uL — ABNORMAL HIGH (ref 4.0–10.5)
nRBC: 0 % (ref 0.0–0.2)

## 2024-08-31 LAB — GLUCOSE, CAPILLARY
Glucose-Capillary: 167 mg/dL — ABNORMAL HIGH (ref 70–99)
Glucose-Capillary: 174 mg/dL — ABNORMAL HIGH (ref 70–99)
Glucose-Capillary: 173 mg/dL — ABNORMAL HIGH (ref 70–99)
Glucose-Capillary: 125 mg/dL — ABNORMAL HIGH (ref 70–99)

## 2024-08-31 MED ORDER — INSULIN ASPART 100 UNIT/ML IJ SOLN
0.0000 [IU] | Freq: Three times a day (TID) | INTRAMUSCULAR | Status: DC
  Administered 2024-09-02: 1 [IU] via SUBCUTANEOUS

## 2024-08-31 MED ORDER — SODIUM CHLORIDE 0.9 % IV SOLN: INTRAVENOUS | Status: DC

## 2024-08-31 MED ORDER — CARMEX CLASSIC LIP BALM EX OINT
1.0000 | TOPICAL_OINTMENT | CUTANEOUS | Status: DC | PRN
  Filled 2024-08-31: qty 10

## 2024-08-31 MED ORDER — ALBUMIN HUMAN 25 % IV SOLN
25.0000 g | Freq: Four times a day (QID) | INTRAVENOUS | Status: AC
  Administered 2024-08-31 – 2024-09-01 (×4): 25 g via INTRAVENOUS
  Filled 2024-08-31 (×4): qty 100

## 2024-08-31 NOTE — Progress Notes (Signed)
 Occupational Therapy Treatment Patient Details Name: Beth [name] MRN: [id number] DOB: 1946-06-02 Today's Date: 08/31/2024   History of present illness Pt is a 78 y.o. F who presented 08/21/24 due to  hematemesis from [place]. Pt found to have L elbow septic arthritis and endoscopy on 9/15 with no obvious bleeding identified. -MRI suggestive of left elbow fluid collection, which was aspirated by IR on 9/16 without evidence of any crystals, pt on broad spectrum abx; cultures negative. PMhx: HTN, HLD, dCHF, CVA, T2DM, CKD, Rt TKA with infection requiring removal of prosthesis x 2, PE, L myringotomy and tube placement (08/15/2024)   OT comments  Pt presented in bed and agreeable to attempt to get to EOB with max assist but then was unable as reported back pain and wanting to go back into supine. She then required total x2 to reposition for self feeding and required set up to min assist as dropping cup initially. Patient will benefit from continued inpatient follow up therapy, <3 hours/day.        If plan is discharge home, recommend the following:  Two people to help with walking and/or transfers;Two people to help with bathing/dressing/bathroom;Assistance with cooking/housework;Assistance with feeding;Direct supervision/assist for medications management;Direct supervision/assist for financial management;Assist for transportation;Help with stairs or ramp for entrance   Equipment Recommendations   (TBD at next venue but if to go home will require lift/hospital bed)    Recommendations for Other Services      Precautions / Restrictions Precautions Precautions: Fall Recall of Precautions/Restrictions: Impaired Precaution/Restrictions Comments: Rt knee spacer Required Braces or Orthoses: Other Brace Other Brace: soft R knee brace Restrictions Weight Bearing Restrictions Per Provider Order: No       Mobility Bed Mobility Overal bed mobility: Needs Assistance Bed Mobility: Rolling, Supine to  Sit Rolling: Max assist, Used rails   Supine to sit: Max assist, HOB elevated, Used rails     General bed mobility comments: attmepted to go to EOB but then decline due to back pain    Transfers                         Balance                                           ADL either performed or assessed with clinical judgement   ADL Overall ADL's : Needs assistance/impaired Eating/Feeding: Minimal assistance;Sitting Eating/Feeding Details (indicate cue type and reason): pt intially reporting they do not want to eat and then needed set up and hand over hand as started to drop cup Grooming: Wash/dry face;Set up;Sitting   Upper Body Bathing: Maximal assistance;Bed level   Lower Body Bathing: +2 for physical assistance;+2 for safety/equipment;Bed level;Total assistance   Upper Body Dressing : Maximal assistance;Bed level   Lower Body Dressing: Total assistance;+2 for physical assistance;+2 for safety/equipment;Bed level                      Extremity/Trunk Assessment Upper Extremity Assessment Upper Extremity Assessment: RUE deficits/detail;LUE deficits/detail RUE Deficits / Details: general weakness LUE Deficits / Details: Pt now no longer in splint and has WFL AROM but reporting pain in thumb and guarding LUE Sensation: decreased light touch LUE Coordination: decreased fine motor   Lower Extremity Assessment Lower Extremity Assessment: Defer to PT evaluation        Vision  Vision Assessment?: Wears glasses for reading;Wears glasses for driving Additional Comments: decrease in L eye vsion than R side   Perception     Praxis     Communication Communication Communication: No apparent difficulties Factors Affecting Communication: Reduced clarity of speech   Cognition Arousal: Alert Behavior During Therapy: WFL for tasks assessed/performed Cognition: Difficult to assess       Memory impairment (select all impairments):  Short-term memory, Working memory Attention impairment (select first level of impairment): Divided attention Executive functioning impairment (select all impairments): Problem solving, Reasoning, Sequencing                   Following commands: Intact Following commands impaired: Follows one step commands with increased time      Cueing   Cueing Techniques: Verbal cues, Gestural cues, Tactile cues  Exercises Exercises:  (completed gentle AROM of LUE but very weak and only ale to complete 3 reps AROM shoulder flexion before fatiguing)    Shoulder Instructions       General Comments      Pertinent Vitals/ Pain       Pain Assessment Pain Assessment: Faces Faces Pain Scale: Hurts whole lot Breathing: normal Negative Vocalization: occasional moan/groan, low speech, negative/disapproving quality Facial Expression: smiling or inexpressive Body Language: tense, distressed pacing, fidgeting Consolability: no need to console PAINAD Score: 2 Pain Location: back Pain Descriptors / Indicators: Aching, Discomfort, Grimacing, Guarding, Sharp Pain Intervention(s): Limited activity within patient's tolerance, Monitored during session, Repositioned  Home Living                                          Prior Functioning/Environment              Frequency  Min 2X/week        Progress Toward Goals  OT Goals(current goals can now be found in the care plan section)  Progress towards OT goals: Progressing toward goals  Acute Rehab OT Goals Patient Stated Goal: to go back to rehab OT Goal Formulation: With patient Time For Goal Achievement: 09/06/24 Potential to Achieve Goals: Fair ADL Goals Pt Will Perform Eating: with set-up;sitting Pt Will Perform Grooming: with modified independence;sitting Pt Will Perform Upper Body Bathing: with contact guard assist;sitting Pt Will Perform Lower Body Bathing: with max assist;bed level Pt Will Perform Upper Body  Dressing: with contact guard assist;sitting Pt Will Perform Lower Body Dressing: bed level;with max assist Pt Will Transfer to Toilet: with min assist;stand pivot transfer;bedside commode Pt Will Perform Toileting - Clothing Manipulation and hygiene: with min assist;sitting/lateral leans Additional ADL Goal #1: Pt will be able to complete bed mobility with min A to maximize functional strength, prepare for transfers and ADLs, and reduce caregiver burden  Plan      Co-evaluation                 AM-PAC OT 6 Clicks Daily Activity     Outcome Measure   Help from another person eating meals?: A Little Help from another person taking care of personal grooming?: A Little Help from another person toileting, which includes using toliet, bedpan, or urinal?: Total Help from another person bathing (including washing, rinsing, drying)?: Total Help from another person to put on and taking off regular upper body clothing?: A Lot Help from another person to put on and taking off regular lower body clothing?: Total 6 Click Score:  11    End of Session    OT Visit Diagnosis: Unsteadiness on feet (R26.81);Other abnormalities of gait and mobility (R26.89);Muscle weakness (generalized) (M62.81);Pain Pain - part of body:  (back)   Activity Tolerance Patient limited by pain   Patient Left in bed;with call bell/phone within reach;with bed alarm set   Nurse Communication Mobility status        Time: 9167-9091 OT Time Calculation (min): 36 min  Charges: OT General Charges $OT Visit: 1 Visit OT Treatments $Self Care/Home Management : 23-37 mins  Warrick POUR OTR/L  Acute Rehab Services  [phone] office number   Warrick Berber 08/31/2024, 9:20 AM

## 2024-08-31 NOTE — TOC Progression Note (Signed)
 Transition of Care [facility]) - Progression Note    Patient Details  Name: Beth [name] MRN: [id number] Date of Birth: January 19, 1946  Transition of Care [facility]) CM/SW Contact  Isaiah Public, LCSWA Phone Number: 08/31/2024, 1:50 PM  Clinical Narrative:     Patients appeal currently pending for SNF. CSW will continue to follow.  Expected Discharge Plan: Skilled Nursing Facility Barriers to Discharge: Continued Medical Work up               Expected Discharge Plan and Services                                               Social Drivers of Health (SDOH) Interventions SDOH Screenings   Food Insecurity: No Food Insecurity (08/22/2024)  Housing: Low Risk  (08/22/2024)  Transportation Needs: No Transportation Needs (08/22/2024)  Recent Concern: Transportation Needs - Unmet Transportation Needs (07/20/2024)   Received from [facility]  Utilities: Not At Risk (08/22/2024)  Alcohol Screen: Low Risk  (12/30/2023)  Depression (PHQ2-9): Medium Risk (07/20/2024)  Financial Resource Strain: Low Risk  (07/20/2024)   Received from [facility]  Physical Activity: Inactive (07/20/2024)   Received from [facility]  Social Connections: Moderately Integrated (08/22/2024)  Recent Concern: Social Connections - Socially Isolated (07/20/2024)   Received from [facility]  Stress: No Stress Concern Present (07/20/2024)   Received from [facility]  Tobacco Use: Low Risk  (08/22/2024)  Health Literacy: Low Risk  (07/20/2024)   Received from [facility]    Readmission Risk Interventions     No data to display

## 2024-08-31 NOTE — Consult Note (Signed)
 Palliative Medicine Inpatient Consult Note  Consulting Provider: Dr. Celinda  Reason for consult:   Palliative Care Consult Services Palliative Medicine Consult  Reason for Consult? Reviewed surgical intervention, she agreed to talk to palliative care   08/31/2024  HPI:  Per intake H&P --> Beth [name] is an 78 y.o. female past medical history of recent PE on Eliquis , chronic kidney see stage IV, diabetes mellitus type 2 essential hypertension CVA chronic diastolic dysfunction with a history of infected right knee replacement and placement of antibiotic spacers, recently discharged for otitis media comes in with hematemesis from skilled nursing facility.  There was a concern for septic left elbow so orthopedic surgery was consulted status post aspiration of left elbow by IR. Palliative care has been asked to support additional goals of care conversations.   Clinical Assessment/Goals of Care:  *Please note that this is a verbal dictation therefore any spelling or grammatical errors are due to the Dragon Medical One system interpretation.  I have reviewed medical records including EPIC notes, labs and imaging, received report from bedside RN, assessed the patient who is sitting up in the chair in NAD.    I met with Beth [name] to further discuss diagnosis prognosis, GOC, EOL wishes, disposition and options.   I introduced Palliative Medicine as specialized medical care for people living with serious illness. It focuses on providing relief from the symptoms and stress of a serious illness. The goal is to improve quality of life for both the patient and the family.  Medical History Review and Understanding:  A review of Beth [name]'s past medical history completed inclusive of pulmonary embolism, stage IV chronic kidney disease, stage II diabetes mellitus, hypertension, CVA, diastolic dysfunction, right knee replacement (recurrent infection requiring antibiotic spacer).  Social History:  Beth [name] is from  [place], [place] .  She is not married.  She has a children, multiple grandchildren, and multiple great-grandchildren.  Normal formally worked in textile's.  Adlyn shares when she was younger she was very active and enjoyed both playing and watching sports on television.  Finstad is a woman of faith practicing within the [facility] denomination.  Functional and Nutritional State:  Prior to the end of August Vali lived to functional life.  She was able to provide care for herself in a single-family home in [place] .  Her daughter would check in on her frequently.  Advance Directives:  A detailed discussion was had today regarding advanced directives.  Yes, a copy has been requested . Patients son, Marcey is her surrogate Management consultant.   Code Status:  Concepts specific to code status, artifical feeding and hydration, continued IV antibiotics and rehospitalization was had.  The difference between a aggressive medical intervention path  and a palliative comfort care path for this patient at this time was had.   Encouraged patient/family to consider DNR/DNI status understanding evidenced based poor outcomes in similar hospitalized patient, as the cause of arrest is likely associated with advanced chronic/terminal illness rather than an easily reversible acute cardio-pulmonary event. I explained that DNR/DNI does not change the medical plan and it only comes into effect after a person has arrested (died).  It is a protective measure to keep us  from harming the patient in their last moments of life.   Beth [name] does not believe she would want to be resuscitated though would like to take some time to consider this further.   Discussion:  A review of Beth [name]'s recent hospitalizations was completed. We reviewed that she has required  hospitalization due to her poorly controlled hypertension, a L ear infection/t herpes, and a GIB. We discussed how each hospitalization has had an impact on  her overall health.  We reviewed the chronic disease trajectory in patients who have multiple co-morbidities. I shared that often an event will occur leading to an  acute hospitalizationsuch as a fall, UTI, PNA, heart failure exacerbation, copd exacerbation, or another illness sort. We discussed that patients may have been functioning at a high plateau initially, then an acute event occurs. We discussed that after this event their function, mental, and nutritional states are compromised. Often with treatment and rehabilitation there is some regain in each individuals health, though often not to their prior baseline level. We discussed that then another event will occur causing a rehospitalization and a further decline. I shared that often this will become a pattern and each event causes greater burden to the individual, depleting them further or their function, cognition, or nutritional state.   We discussed patient R knee infection(s) the potential outcomes moving forward for better or for worse. She is aware that not getting an amputation could lead to an infection in her body and potentially result in her death. She would prefer this as opposed to losing a limb.  Lilyanah is hopeful for rehabilitation to see if she can gain strength.  Discussed the importance of continued conversation with family and their  medical providers regarding overall plan of care and treatment options, ensuring decisions are within the context of the patients values and GOCs.  _______________ Addendum:  Patient's son, Beth [name] was called and a HIPAA compliant voicemail was left.  Patient's daughter, Beth [name] is listed as an emergency contact.  I was able to call Nels and discussed with her the above conversation.  She agrees that unfortunately her mother's health has deteriorated since her recurrent rehospitalization's.  She does understand the effect that these recurrent hospitalizations have had on the  patient's physical state.  Beth does share that her mother has been adamant in regards to not getting her leg amputated.  They had sought a second opinion as well.  I discussed with Beth potential outcomes in the long-term regarding patient's recurrent exposure to antibiotics as well as her recurring right knee infection.  Patient's daughter shares she has prepared for all potential outcomes in her mother's case.  Patient's daughter shares she was speak to her brother Marcey further about her mothers wishes regarding resuscitation.  Patient's daughter notes there is no one who could care for [facility] in her home and placement is necessary.  I shared I would reach out to the case management team for further insights in regards to this matter.  I shared I would follow-up tomorrow and offer ongoing support for Tenee and her family.  Decision Maker: Marcey Cook: [phone]  SUMMARY OF RECOMMENDATIONS   Full Code --> Patient will consider this further   Reviewed chronic disease trajectory  Discussed best case and worse case scenarios  Patient clear about not wanting RLE amputation --> She is aware this could lead to her demise  Patient is open to OP Palliative support  Discussed hospice as a consideration  Ongoing PMT support  Code Status/Advance Care Planning: FULL CODE   Palliative Prophylaxis:  Aspiration, Bowel Regimen, Delirium Protocol, Frequent Pain Assessment, Oral Care, Palliative Wound Care, and Turn Reposition  Additional Recommendations (Limitations, Scope, Preferences): Continue current care  Psycho-social/Spiritual:  Desire for further Chaplaincy support: Not at this time Additional Recommendations: Review of  chronic disease burden   Prognosis: Increased 12 month mortality risk in the setting of recurrent re-hospitalizations, increased disease burden, and frailty  Discharge Planning: To be determined.   Vitals:   08/31/24 1121 08/31/24 1128  BP: (!) 111/92  (!) 111/92  Pulse: 77   Resp: 18   Temp: 98 F (36.7 C)   SpO2: 93%     Intake/Output Summary (Last 24 hours) at 08/31/2024 1226 Last data filed at 08/30/2024 2200 Gross per 24 hour  Intake 240 ml  Output --  Net 240 ml   Last Weight  Most recent update: 08/22/2024  8:41 AM    Weight  99.3 kg (218 lb 14.7 oz)            LABS: CBC:    Component Value Date/Time   WBC 21.4 (H) 08/31/2024 0528   HGB 9.2 (L) 08/31/2024 0528   HGB 12.1 03/03/2024 1539   HGB 13.3 10/26/2012 1014   HCT 29.2 (L) 08/31/2024 0528   HCT 38.4 03/03/2024 1539   HCT 41.0 10/26/2012 1014   PLT 545 (H) 08/31/2024 0528   PLT 290 03/03/2024 1539   MCV 83.2 08/31/2024 0528   MCV 82 03/03/2024 1539   MCV 82.1 10/26/2012 1014   NEUTROABS 5.4 08/21/2024 1438   NEUTROABS 3.2 03/03/2024 1539   NEUTROABS 5.0 10/26/2012 1014   LYMPHSABS 1.5 08/21/2024 1438   LYMPHSABS 1.3 03/03/2024 1539   LYMPHSABS 1.6 10/26/2012 1014   MONOABS 1.0 08/21/2024 1438   MONOABS 0.5 10/26/2012 1014   EOSABS 0.1 08/21/2024 1438   EOSABS 0.1 03/03/2024 1539   BASOSABS 0.0 08/21/2024 1438   BASOSABS 0.0 03/03/2024 1539   BASOSABS 0.0 10/26/2012 1014   Comprehensive Metabolic Panel:    Component Value Date/Time   NA 132 (L) 08/31/2024 0528   NA 138 03/03/2024 1539   NA 142 10/26/2012 1014   K 4.6 08/31/2024 0528   K 4.0 10/26/2012 1014   CL 104 08/31/2024 0528   CL 104 10/26/2012 1014   CO2 17 (L) 08/31/2024 0528   CO2 29 10/26/2012 1014   BUN 45 (H) 08/31/2024 0528   BUN 30 (H) 03/03/2024 1539   BUN 18.0 10/26/2012 1014   CREATININE 3.53 (H) 08/31/2024 0528   CREATININE 1.27 (H) 07/22/2021 1150   CREATININE 1.0 10/26/2012 1014   GLUCOSE 150 (H) 08/31/2024 0528   GLUCOSE 131 (H) 10/26/2012 1014   CALCIUM  7.9 (L) 08/31/2024 0528   CALCIUM  10.2 10/26/2012 1014   AST 16 08/31/2024 0528   ALT 10 08/31/2024 0528   ALKPHOS 80 08/31/2024 0528   BILITOT 0.7 08/31/2024 0528   BILITOT 0.7 03/03/2024 1539   PROT 6.6  08/31/2024 0528   PROT 7.4 03/03/2024 1539   ALBUMIN  1.7 (L) 08/31/2024 0528   ALBUMIN  4.1 03/03/2024 1539   Gen:  Elderly AA F chronically ill in appearance HEENT: moist mucous membranes CV: Regular rate and rhythm  PULM: On RA, breathing is even and nonlabored ABD: soft/nontender  EXT: LUE edema Neuro: Alert and oriented x3   PPS: 30%   This conversation/these recommendations were discussed with patient primary care team, Dr. Jone ______________________________________________________ Rosaline Becton [place] Palliative Medicine Team Team Cell Phone: [phone] Please utilize secure chat with additional questions, if there is no response within 30 minutes please call the above phone number  Total Time: 75 Billing based on MDM: High  Palliative Medicine Team providers are available by phone from 7am to 7pm daily and can be reached through  the team cell phone.  Should this patient require assistance outside of these hours, please call the patient's attending physician.

## 2024-08-31 NOTE — Progress Notes (Addendum)
 [facility] PROGRESS NOTE    Progress Note  Beth [name]  FMW:[id number] DOB: 20-Jan-1946 DOA: 08/21/2024 PCP: Zollie Lowers, MD     Brief Narrative:   Beth [name] is an 78 y.o. female past medical history of recent PE on Eliquis , chronic kidney see stage IV, diabetes mellitus type 2 essential hypertension CVA chronic diastolic dysfunction with a history of infected right knee replacement and placement of antibiotic spacers, recently discharged for otitis media comes in with hematemesis from skilled nursing facility.  There was a concern for septic left elbow so orthopedic surgery was consulted status post aspiration of left elbow by IR   Assessment/Plan:   Hematemesis with nausea GI was consulted endoscopy performed 08/22/2024 that showed 4 cm hiatal hernia some gastritis aspirin  was stopped she was started on Eliquis . Hemoglobin is stable.  Left elbow cellulitis/chronic left knee pain: She adamantly refused intervention of the right knee which will require amputation. Orthopedic surgery was consulted which is now signed off, as she did not want any further intervention. I had a long discussion with her PT was present. Will get Perative care involved she agrees. MRI of the left elbow show fluid collection which was aspirated on 08/23/2024 without evidence of crystals.  Culture data remain negative so far. She was continued on antibiotics. ID was consulted recommended change antibiotics to doxycycline  and Augmentin  for 4 weeks follow-up in clinic on 09/27/2023 Okay for DC per ID. Awaiting insurance authorization, currently appealing insurance denial  Acute kidney injury on chronic kidney disease stage IV With a baseline creatinine around 1.9 to 2.2, this morning is 3.5. Discontinue diclofenac . Started on IV fluids recheck basic metabolic panel in the morning. She has had multiple scans without contrast.    Elevated troponin Denies any chest pain likely demand ischemia  cardiology recommended conservative management.  DVT/PE: Without chest pain continue Eliquis .  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 Follow-up with ENT as an outpatient. Giving optic through 07/16/2024.  Essential hypertension: Continue Cardizem  hydralazine  and Imdur .  Chronic diastolic dysfunction: Repeated 2D echo showed preserved EF unchanged from previous continue current management.  Diabetes mellitus type 2: Continue Jardiance  A1c of 6.5.  History of CVA with residual deficits: On statin and Eliquis .  PT evaluated the patient recommended skilled nursing facility, insurance was denied she is currently appealing.  DVT prophylaxis: lovenox  Family Communication:none Status is: Inpatient Remains inpatient appropriate because: Clinical septic arthritis    Code Status:     Code Status Orders  (From admission, onward)           Start     Ordered   08/21/24 2117  Full code  Continuous       Question:  By:  Answer:  Consent: discussion documented in EHR   08/21/24 2119           Code Status History     Date Active Date Inactive Code Status Order ID Comments User Context   08/15/2024 0853 08/19/2024 1839 Full Code [id number]  Claudene Maximino LABOR, MD ED   08/01/2024 0728 08/07/2024 2132 Full Code [id number]  Claudene Maximino LABOR, MD Inpatient   08/27/2020 1021 08/29/2020 2008 Full Code [id number]  Melodi Lerner, MD Inpatient   12/03/2018 2300 12/09/2018 2221 Full Code [id number]  Tobie Jorie SAUNDERS, MD ED   11/03/2018 1812 11/09/2018 1842 Full Code [id number]  Aluisio, Lerner,  MD Inpatient   08/06/2018 1942 08/17/2018 1903 Full Code [id number]  Tomasita Gauze Inpatient   01/14/2017 1928 01/15/2017 1532 Full Code [id number]  Melodi Lerner, MD Inpatient   02/28/2016 1955 03/01/2016 2012 Full Code [id number]  Hilma Rankins, MD ED    05/23/2015 2231 05/26/2015 1825 Full Code [id number]  Melodi Lerner, MD Inpatient   02/16/2015 2024 02/19/2015 1935 Full Code [id number]  Melodi Lerner, MD Inpatient   10/02/2014 1745 10/05/2014 1606 Full Code [id number]  Melodi Lerner GAILS, MD Inpatient   08/11/2014 2106 08/14/2014 1626 Full Code [id number]  Melodi Lerner GAILS, MD Inpatient   07/03/2014 1554 07/05/2014 1816 Full Code [id number]  Melodi Lerner GAILS, MD Inpatient      Advance Directive Documentation    Flowsheet Row Most Recent Value  Type of Advance Directive Healthcare Power of Attorney  Pre-existing out of facility DNR order (yellow form or pink MOST form) --  MOST Form in Place? --      IV Access:   Peripheral IV   Procedures and diagnostic studies:   No results found.   Medical Consultants:   None.   Subjective:    Beth [name] no complaints.  Objective:    Vitals:   08/30/24 2356 08/31/24 0405 08/31/24 0646 08/31/24 0758  BP: 135/73 111/67 (!) 130/95 (!) 142/86  Pulse: 69 91  94  Resp: 18 18  20   Temp: (!) 97.4 F (36.3 C) 98.4 F (36.9 C)  98.1 F (36.7 C)  TempSrc: Oral Axillary  Oral  SpO2: 96% 94%  93%  Weight:      Height:       SpO2: 93 % O2 Flow Rate (L/min): 2 L/min FiO2 (%): 100 %   Intake/Output Summary (Last 24 hours) at 08/31/2024 0841 Last data filed at 08/30/2024 2200 Gross per 24 hour  Intake 358 ml  Output --  Net 358 ml   Filed Weights   08/22/24 0839  Weight: 99.3 kg    Exam: General exam: In no acute distress. Respiratory system: Good air movement and clear to auscultation. Cardiovascular system: S1 & S2 heard, RRR. No JVD. Gastrointestinal system: Abdomen is nondistended, soft and nontender.  Extremities: No pedal edema. Skin: No rashes, lesions or ulcers Psychiatry: Judgement and insight appear normal. Mood & affect appropriate.    Data Reviewed:    Labs: Basic Metabolic Panel: Recent Labs  Lab 08/25/24 0458 08/26/24 0459 08/27/24 0505 08/28/24 0330  08/29/24 0639 08/31/24 0528  NA 139 136 135 134* 134* 132*  K 4.1 3.7 3.6 4.0 3.7 4.6  CL 106 109 106 105 107 104  CO2 16* 18* 17* 18* 18* 17*  GLUCOSE 101* 101* 110* 128* 93 150*  BUN 32* 27* 26* 27* 27* 45*  CREATININE 1.89* 1.98* 1.95* 2.00* 2.12* 3.53*  CALCIUM  8.2* 8.0* 8.2* 8.1* 8.0* 7.9*  MG 2.0 1.9 1.9 2.0 1.9  --    GFR Estimated Creatinine Clearance: 14.8 mL/min (A) (by C-G formula based on SCr of 3.53 mg/dL (H)). Liver Function Tests: Recent Labs  Lab 08/31/24 0528  AST 16  ALT 10  ALKPHOS 80  BILITOT 0.7  PROT 6.6  ALBUMIN  1.7*   No results for input(s): LIPASE, AMYLASE in the last 168 hours. No results for input(s): AMMONIA in the last 168 hours. Coagulation profile No results for input(s): INR, PROTIME in the last 168 hours. COVID-19 Labs  No results for input(s): DDIMER, FERRITIN, LDH, CRP in the last 72 hours.  Lab Results  Component Value  Date   SARSCOV2NAA NEGATIVE 08/21/2024   SARSCOV2NAA NEGATIVE 08/14/2024   SARSCOV2NAA NEGATIVE 08/01/2024   SARSCOV2NAA NEGATIVE 08/23/2020    CBC: Recent Labs  Lab 08/26/24 0459 08/27/24 0505 08/28/24 0330 08/29/24 0639 08/31/24 0528  WBC 6.7 8.1 8.7 7.2 21.4*  HGB 8.9* 9.3* 9.4* 8.8* 9.2*  HCT 28.2* 28.9* 29.1* 28.0* 29.2*  MCV 83.7 82.6 81.7 83.1 83.2  PLT 415* 472* 499* 476* 545*   Cardiac Enzymes: No results for input(s): CKTOTAL, CKMB, CKMBINDEX, TROPONINI in the last 168 hours. BNP (last 3 results) No results for input(s): PROBNP in the last 8760 hours. CBG: Recent Labs  Lab 08/30/24 0909 08/30/24 1118 08/30/24 1718 08/30/24 2127 08/31/24 0801  GLUCAP 145* 164* 193* 223* 167*   D-Dimer: No results for input(s): DDIMER in the last 72 hours. Hgb A1c: No results for input(s): HGBA1C in the last 72 hours. Lipid Profile: No results for input(s): CHOL, HDL, LDLCALC, TRIG, CHOLHDL, LDLDIRECT in the last 72 hours. Thyroid  function studies: No  results for input(s): TSH, T4TOTAL, T3FREE, THYROIDAB in the last 72 hours.  Invalid input(s): FREET3 Anemia work up: No results for input(s): VITAMINB12, FOLATE, FERRITIN, TIBC, IRON , RETICCTPCT in the last 72 hours. Sepsis Labs: Recent Labs  Lab 08/27/24 0505 08/28/24 0330 08/29/24 0639 08/31/24 0528  WBC 8.1 8.7 7.2 21.4*   Microbiology Recent Results (from the past 240 hours)  Resp panel by RT-PCR (RSV, Flu A&B, Covid) Anterior Nasal Swab     Status: None   Collection Time: 08/21/24  2:38 PM   Specimen: Anterior Nasal Swab  Result Value Ref Range Status   SARS Coronavirus 2 by RT PCR NEGATIVE NEGATIVE Final   Influenza A by PCR NEGATIVE NEGATIVE Final   Influenza B by PCR NEGATIVE NEGATIVE Final    Comment: (NOTE) The Xpert Xpress SARS-CoV-2/FLU/RSV plus assay is intended as an aid in the diagnosis of influenza from Nasopharyngeal swab specimens and should not be used as a sole basis for treatment. Nasal washings and aspirates are unacceptable for Xpert Xpress SARS-CoV-2/FLU/RSV testing.  Fact Sheet for Patients: BloggerCourse.com  Fact Sheet for Healthcare Providers: SeriousBroker.it  This test is not yet approved or cleared by the [place]  FDA and has been authorized for detection and/or diagnosis of SARS-CoV-2 by FDA under an Emergency Use Authorization (EUA). This EUA will remain in effect (meaning this test can be used) for the duration of the COVID-19 declaration under Section 564(b)(1) of the Act, 21 U.S.C. section 360bbb-3(b)(1), unless the authorization is terminated or revoked.     Resp Syncytial Virus by PCR NEGATIVE NEGATIVE Final    Comment: (NOTE) Fact Sheet for Patients: BloggerCourse.com  Fact Sheet for Healthcare Providers: SeriousBroker.it  This test is not yet approved or cleared by the [place]  FDA and has been  authorized for detection and/or diagnosis of SARS-CoV-2 by FDA under an Emergency Use Authorization (EUA). This EUA will remain in effect (meaning this test can be used) for the duration of the COVID-19 declaration under Section 564(b)(1) of the Act, 21 U.S.C. section 360bbb-3(b)(1), unless the authorization is terminated or revoked.  Performed at [facility], [address] [place], [place] [address]   Culture, blood (Routine X 2) w Reflex to ID Panel     Status: None   Collection Time: 08/22/24  4:36 AM   Specimen: BLOOD  Result Value Ref Range Status   Specimen Description BLOOD SITE NOT SPECIFIED  Final   Special Requests   Final  BOTTLES DRAWN AEROBIC AND ANAEROBIC Blood Culture adequate volume   Culture   Final    NO GROWTH 5 DAYS Performed at [facility], [address], [place], [place] [address]    Report Status 08/27/2024 FINAL  Final  Body fluid culture w Gram Stain     Status: None   Collection Time: 08/23/24  4:12 PM   Specimen: Joint, Left Elbow; Body Fluid  Result Value Ref Range Status   Specimen Description SYNOVIAL  Final   Special Requests LEFT ELBOW  Final   Gram Stain   Final    FEW WBC PRESENT, PREDOMINANTLY PMN NO ORGANISMS SEEN    Culture   Final    NO GROWTH 3 DAYS Performed at [facility], [address] [place], [place] [address]    Report Status 08/26/2024 FINAL  Final     Medications:    amoxicillin -clavulanate  1 tablet Oral BID   apixaban   5 mg Oral BID   atorvastatin   40 mg Oral QHS   azelastine   1 spray Each Nare BID   carbamide peroxide  5 drop Both EARS BID   ciprofloxacin -dexamethasone   4 drop Left EAR BID   diltiazem   240 mg Oral Daily   doxycycline   100 mg Oral Q12H   famotidine   10 mg Oral Daily   feeding supplement  237 mL Oral BID BM   ferrous sulfate   325 mg Oral Once per day on Monday Thursday   hydrALAZINE   50 mg Oral Q8H   insulin  aspart  0-9 Units Subcutaneous Q4H   isosorbide  mononitrate  60  mg Oral Daily   pantoprazole   40 mg Oral QAC breakfast   Continuous Infusions:    LOS: 10 days   Erle Odell Castor  [facility]  08/31/2024, 8:41 AM

## 2024-08-31 NOTE — Plan of Care (Signed)

## 2024-08-31 NOTE — Plan of Care (Signed)
  Problem: Education: Goal: Ability to describe self-care measures that may prevent or decrease complications (Diabetes Survival Skills Education) will improve Outcome: Progressing Goal: Individualized Educational Video(s) Outcome: Progressing   Problem: Coping: Goal: Ability to adjust to condition or change in health will improve Outcome: Progressing   Problem: Fluid Volume: Goal: Ability to maintain a balanced intake and output will improve Outcome: Progressing   Problem: Health Behavior/Discharge Planning: Goal: Ability to identify and utilize available resources and services will improve Outcome: Progressing Goal: Ability to manage health-related needs will improve Outcome: Progressing   Problem: Metabolic: Goal: Ability to maintain appropriate glucose levels will improve Outcome: Progressing   Problem: Nutritional: Goal: Maintenance of adequate nutrition will improve Outcome: Progressing Goal: Progress toward achieving an optimal weight will improve Outcome: Progressing   Problem: Skin Integrity: Goal: Risk for impaired skin integrity will decrease Outcome: Progressing   Problem: Tissue Perfusion: Goal: Adequacy of tissue perfusion will improve Outcome: Progressing   Problem: Clinical Measurements: Goal: Ability to avoid or minimize complications of infection will improve Outcome: Progressing   Problem: Skin Integrity: Goal: Skin integrity will improve Outcome: Progressing   Problem: Education: Goal: Knowledge of General Education information will improve Description: Including pain rating scale, medication(s)/side effects and non-pharmacologic comfort measures Outcome: Progressing   Problem: Health Behavior/Discharge Planning: Goal: Ability to manage health-related needs will improve Outcome: Progressing   Problem: Clinical Measurements: Goal: Ability to maintain clinical measurements within normal limits will improve Outcome: Progressing Goal: Will  remain free from infection Outcome: Progressing Goal: Diagnostic test results will improve Outcome: Progressing Goal: Respiratory complications will improve Outcome: Progressing Goal: Cardiovascular complication will be avoided Outcome: Progressing   Problem: Activity: Goal: Risk for activity intolerance will decrease Outcome: Progressing   Problem: Nutrition: Goal: Adequate nutrition will be maintained Outcome: Progressing   Problem: Coping: Goal: Level of anxiety will decrease Outcome: Progressing   Problem: Elimination: Goal: Will not experience complications related to bowel motility Outcome: Progressing Goal: Will not experience complications related to urinary retention Outcome: Progressing   Problem: Pain Managment: Goal: General experience of comfort will improve and/or be controlled Outcome: Progressing   Problem: Safety: Goal: Ability to remain free from injury will improve Outcome: Progressing   Problem: Skin Integrity: Goal: Risk for impaired skin integrity will decrease Outcome: Progressing

## 2024-08-31 NOTE — Progress Notes (Signed)
 Mobility Specialist Progress Note;   08/31/24 1134  Mobility  Activity Mechanically lifted from bed to chair  Level of Assistance Maximum assist, patient does 25-49%  Assistive Device MaxiMove  Activity Response Tolerated well  Mobility Referral Yes  Mobility visit 1 Mobility  Mobility Specialist Start Time (ACUTE ONLY) 1134  Mobility Specialist Stop Time (ACUTE ONLY) 1154  Mobility Specialist Time Calculation (min) (ACUTE ONLY) 20 min   Pt agreeable to transfer OOB to chair. Required MaxA +2 to assist in rolling and safely transfer pt from bed to chair via MaxiMove. C/o L knee pain throughout. Pt left comfortably in chair with all needs met, alarm on. Nursing staff in room.   Lauraine Erm Mobility Specialist Please contact via SecureChat or [employer] [phone]

## 2024-09-01 ENCOUNTER — Inpatient Hospital Stay (HOSPITAL_COMMUNITY)

## 2024-09-01 ENCOUNTER — Ambulatory Visit: Admitting: Family Medicine

## 2024-09-01 DIAGNOSIS — K920 Hematemesis: Secondary | ICD-10-CM | POA: Diagnosis not present

## 2024-09-01 DIAGNOSIS — Z66 Do not resuscitate: Secondary | ICD-10-CM | POA: Diagnosis not present

## 2024-09-01 DIAGNOSIS — Z515 Encounter for palliative care: Secondary | ICD-10-CM | POA: Diagnosis not present

## 2024-09-01 DIAGNOSIS — Z7189 Other specified counseling: Secondary | ICD-10-CM | POA: Diagnosis not present

## 2024-09-01 LAB — CBC
HCT: 27.6 % — ABNORMAL LOW (ref 36.0–46.0)
Hemoglobin: 8.6 g/dL — ABNORMAL LOW (ref 12.0–15.0)
MCH: 26 pg (ref 26.0–34.0)
MCHC: 31.2 g/dL (ref 30.0–36.0)
MCV: 83.4 fL (ref 80.0–100.0)
Platelets: 527 K/uL — ABNORMAL HIGH (ref 150–400)
RBC: 3.31 MIL/uL — ABNORMAL LOW (ref 3.87–5.11)
RDW: 17.9 % — ABNORMAL HIGH (ref 11.5–15.5)
WBC: 16.6 K/uL — ABNORMAL HIGH (ref 4.0–10.5)
nRBC: 0.1 % (ref 0.0–0.2)

## 2024-09-01 LAB — BASIC METABOLIC PANEL WITH GFR
Anion gap: 11 (ref 5–15)
BUN: 52 mg/dL — ABNORMAL HIGH (ref 8–23)
CO2: 15 mmol/L — ABNORMAL LOW (ref 22–32)
Calcium: 7.7 mg/dL — ABNORMAL LOW (ref 8.9–10.3)
Chloride: 105 mmol/L (ref 98–111)
Creatinine, Ser: 3.96 mg/dL — ABNORMAL HIGH (ref 0.44–1.00)
GFR, Estimated: 11 mL/min — ABNORMAL LOW (ref >60)
Glucose, Bld: 126 mg/dL — ABNORMAL HIGH (ref 70–99)
Potassium: 4.6 mmol/L (ref 3.5–5.1)
Sodium: 131 mmol/L — ABNORMAL LOW (ref 135–145)

## 2024-09-01 LAB — GLUCOSE, CAPILLARY
Glucose-Capillary: 132 mg/dL — ABNORMAL HIGH (ref 70–99)
Glucose-Capillary: 180 mg/dL — ABNORMAL HIGH (ref 70–99)
Glucose-Capillary: 162 mg/dL — ABNORMAL HIGH (ref 70–99)

## 2024-09-01 MED ORDER — STERILE WATER FOR INJECTION IV SOLN
INTRAVENOUS | Status: AC
  Filled 2024-09-01: qty 1000

## 2024-09-01 MED ORDER — SODIUM CHLORIDE 0.9 % IV SOLN: INTRAVENOUS | Status: DC

## 2024-09-01 MED ORDER — STERILE WATER FOR INJECTION IV SOLN: INTRAVENOUS | Status: DC

## 2024-09-01 MED ORDER — ALBUMIN HUMAN 25 % IV SOLN
25.0000 g | Freq: Four times a day (QID) | INTRAVENOUS | Status: AC
  Administered 2024-09-01 – 2024-09-02 (×3): 25 g via INTRAVENOUS
  Filled 2024-09-01 (×3): qty 100

## 2024-09-01 MED ORDER — FUROSEMIDE 10 MG/ML IJ SOLN
80.0000 mg | Freq: Two times a day (BID) | INTRAMUSCULAR | Status: DC
  Administered 2024-09-01: 80 mg via INTRAVENOUS
  Filled 2024-09-01: qty 8

## 2024-09-01 NOTE — Plan of Care (Signed)
  Problem: Education: Goal: Ability to describe self-care measures that may prevent or decrease complications (Diabetes Survival Skills Education) will improve Outcome: Progressing Goal: Individualized Educational Video(s) Outcome: Progressing   Problem: Coping: Goal: Ability to adjust to condition or change in health will improve Outcome: Progressing   Problem: Fluid Volume: Goal: Ability to maintain a balanced intake and output will improve Outcome: Progressing   Problem: Health Behavior/Discharge Planning: Goal: Ability to identify and utilize available resources and services will improve Outcome: Progressing Goal: Ability to manage health-related needs will improve Outcome: Progressing   Problem: Metabolic: Goal: Ability to maintain appropriate glucose levels will improve Outcome: Progressing   Problem: Nutritional: Goal: Maintenance of adequate nutrition will improve Outcome: Progressing Goal: Progress toward achieving an optimal weight will improve Outcome: Progressing   Problem: Skin Integrity: Goal: Risk for impaired skin integrity will decrease Outcome: Progressing   Problem: Tissue Perfusion: Goal: Adequacy of tissue perfusion will improve Outcome: Progressing   Problem: Clinical Measurements: Goal: Ability to avoid or minimize complications of infection will improve Outcome: Progressing   Problem: Skin Integrity: Goal: Skin integrity will improve Outcome: Progressing   Problem: Education: Goal: Knowledge of General Education information will improve Description: Including pain rating scale, medication(s)/side effects and non-pharmacologic comfort measures Outcome: Progressing   Problem: Health Behavior/Discharge Planning: Goal: Ability to manage health-related needs will improve Outcome: Progressing   Problem: Clinical Measurements: Goal: Ability to maintain clinical measurements within normal limits will improve Outcome: Progressing Goal: Will  remain free from infection Outcome: Progressing Goal: Diagnostic test results will improve Outcome: Progressing Goal: Respiratory complications will improve Outcome: Progressing Goal: Cardiovascular complication will be avoided Outcome: Progressing   Problem: Activity: Goal: Risk for activity intolerance will decrease Outcome: Progressing   Problem: Nutrition: Goal: Adequate nutrition will be maintained Outcome: Progressing   Problem: Coping: Goal: Level of anxiety will decrease Outcome: Progressing   Problem: Elimination: Goal: Will not experience complications related to bowel motility Outcome: Progressing Goal: Will not experience complications related to urinary retention Outcome: Progressing   Problem: Pain Managment: Goal: General experience of comfort will improve and/or be controlled Outcome: Progressing   Problem: Safety: Goal: Ability to remain free from injury will improve Outcome: Progressing   Problem: Skin Integrity: Goal: Risk for impaired skin integrity will decrease Outcome: Progressing

## 2024-09-01 NOTE — Consult Note (Signed)
 Reason for Consult: Worsening AKI on CKD Referring Physician: Dr. Odell Castor  Beth [name] is an 78 y.o. female.  HPI:  Beth [name] is an 78 y.o. female past medical history of recent PE on Eliquis , chronic kidney see stage IV, diabetes mellitus type 2 essential hypertension CVA chronic diastolic dysfunction with a history of infected right knee replacement and placement of antibiotic spacers, recently discharged for otitis media comes in with hematemesis from skilled nursing facility.  There was a concern for septic left elbow so orthopedic surgery was consulted status post aspiration of left elbow by IR.   She had a sudden increase in her serum creatinine about 3 days ago above her baseline.  For the past few days, she has had progressively decreasing urine output.  CT of the abdomen pelvis 9/14 showed small nonobstructive bilateral calculi, no evidence of hydronephrosis.  Trend in Creatinine: Creatinine  Date/Time Value Ref Range Status  10/26/2012 10:14 AM 1.0 0.6 - 1.1 mg/dL Final   Creat  Date/Time Value Ref Range Status  07/22/2021 11:50 AM 1.27 (H) 0.60 - 1.00 mg/dL Final  95/70/7977 87:68 PM 1.08 (H) 0.60 - 0.93 mg/dL Final    Comment:    For patients >57 years of age, the reference limit for Creatinine is approximately 13% higher for people identified as African-American. .   02/20/2021 04:15 PM 1.12 (H) 0.60 - 0.93 mg/dL Final    Comment:    For patients >31 years of age, the reference limit for Creatinine is approximately 13% higher for people identified as African-American. .   07/23/2020 02:39 PM 1.19 (H) 0.60 - 0.93 mg/dL Final    Comment:    For patients >30 years of age, the reference limit for Creatinine is approximately 13% higher for people identified as African-American. SABRA   04/26/2020 11:13 AM 1.19 (H) 0.60 - 0.93 mg/dL Final    Comment:    For patients >76 years of age, the reference limit for Creatinine is approximately 13% higher for  people identified as African-American. SABRA   01/02/2020 03:03 PM 0.93 0.60 - 0.93 mg/dL Final    Comment:    For patients >96 years of age, the reference limit for Creatinine is approximately 13% higher for people identified as African-American. .   11/22/2019 11:25 AM 0.93 0.60 - 0.93 mg/dL Final    Comment:    For patients >65 years of age, the reference limit for Creatinine is approximately 13% higher for people identified as African-American. .   10/31/2019 11:34 AM 0.97 (H) 0.60 - 0.93 mg/dL Final    Comment:    For patients >2 years of age, the reference limit for Creatinine is approximately 13% higher for people identified as African-American. .   08/22/2019 04:17 PM 1.23 (H) 0.60 - 0.93 mg/dL Final    Comment:    For patients >63 years of age, the reference limit for Creatinine is approximately 13% higher for people identified as African-American. .   06/21/2019 12:03 PM 1.03 (H) 0.60 - 0.93 mg/dL Final    Comment:    For patients >5 years of age, the reference limit for Creatinine is approximately 13% higher for people identified as African-American. SABRA   01/04/2019 04:54 PM 1.02 (H) 0.60 - 0.93 mg/dL Final    Comment:    For patients >85 years of age, the reference limit for Creatinine is approximately 13% higher for people identified as African-American. SABRA   12/15/2018 11:00 AM 1.41 (H) 0.60 - 0.93 mg/dL  Final    Comment:    For patients >28 years of age, the reference limit for Creatinine is approximately 13% higher for people identified as African-American. SABRA   06/21/2013 03:07 PM 1.01 0.50 - 1.10 mg/dL Final   Creatinine, Ser  Date/Time Value Ref Range Status  09/01/2024 04:02 AM 3.96 (H) 0.44 - 1.00 mg/dL Final  90/75/7974 94:71 AM 3.53 (H) 0.44 - 1.00 mg/dL Final    Comment:    DELTA CHECK NOTED  08/29/2024 06:39 AM 2.12 (H) 0.44 - 1.00 mg/dL Final  90/78/7974 96:69 AM 2.00 (H) 0.44 - 1.00 mg/dL Final  90/79/7974 94:94 AM 1.95 (H) 0.44 -  1.00 mg/dL Final  90/80/7974 95:40 AM 1.98 (H) 0.44 - 1.00 mg/dL Final  90/81/7974 95:41 AM 1.89 (H) 0.44 - 1.00 mg/dL Final  90/82/7974 94:71 AM 1.98 (H) 0.44 - 1.00 mg/dL Final  90/83/7974 93:92 AM 1.95 (H) 0.44 - 1.00 mg/dL Final  90/84/7974 92:53 PM 2.04 (H) 0.44 - 1.00 mg/dL Final  90/85/7974 97:61 PM 2.13 (H) 0.44 - 1.00 mg/dL Final  90/87/7974 96:79 AM 2.13 (H) 0.44 - 1.00 mg/dL Final  90/88/7974 97:65 AM 2.01 (H) 0.44 - 1.00 mg/dL Final  90/89/7974 95:96 AM 1.93 (H) 0.44 - 1.00 mg/dL Final  90/90/7974 94:60 AM 1.95 (H) 0.44 - 1.00 mg/dL Final  90/92/7974 90:93 PM 2.20 (H) 0.44 - 1.00 mg/dL Final  90/92/7974 90:99 PM 2.03 (H) 0.44 - 1.00 mg/dL Final  91/68/7974 97:71 AM 2.99 (H) 0.44 - 1.00 mg/dL Final  91/69/7974 97:55 AM 3.26 (H) 0.44 - 1.00 mg/dL Final  91/70/7974 97:82 AM 3.50 (H) 0.44 - 1.00 mg/dL Final  91/71/7974 97:70 AM 3.22 (H) 0.44 - 1.00 mg/dL Final  91/72/7974 97:58 AM 3.45 (H) 0.44 - 1.00 mg/dL Final  91/73/7974 87:42 AM 2.76 (H) 0.44 - 1.00 mg/dL Final  91/74/7974 90:75 AM 2.66 (H) 0.44 - 1.00 mg/dL Final  96/72/7974 96:60 PM 1.48 (H) 0.57 - 1.00 mg/dL Final  88/86/7975 96:81 PM 1.74 (H) 0.57 - 1.00 mg/dL Final  90/87/7975 96:42 PM 1.59 (H) 0.57 - 1.00 mg/dL Final  91/84/7975 95:80 PM 1.81 (H) 0.57 - 1.00 mg/dL Final  98/88/7975 96:99 PM 1.32 (H) 0.57 - 1.00 mg/dL Final  92/88/7976 95:91 PM 1.75 (H) 0.57 - 1.00 mg/dL Final  98/94/7976 96:63 PM 1.52 (H) 0.57 - 1.00 mg/dL Final  93/69/7977 96:94 PM 1.55 (H) 0.57 - 1.00 mg/dL Final  97/98/7977 95:77 PM 1.29 (H) 0.57 - 1.00 mg/dL Final  90/77/7978 96:72 AM 0.94 0.44 - 1.00 mg/dL Final  90/78/7978 96:50 AM 0.84 0.44 - 1.00 mg/dL Final  90/85/7978 96:86 PM 0.92 0.44 - 1.00 mg/dL Final  90/90/7978 96:52 PM 1.26 (H) 0.57 - 1.00 mg/dL Final  95/77/7978 98:69 PM 1.15 (H) 0.57 - 1.00 mg/dL Final  98/96/7978 88:77 PM 0.93 0.44 - 1.00 mg/dL Final  98/71/7979 88:47 AM 1.14 (H) 0.57 - 1.00 mg/dL Final  98/97/7979 95:72 AM  1.56 (H) 0.44 - 1.00 mg/dL Final  98/98/7979 95:57 AM 1.67 (H) 0.44 - 1.00 mg/dL Final  87/68/7980 95:63 AM 1.94 (H) 0.44 - 1.00 mg/dL Final  87/69/7980 95:61 AM 2.02 (H) 0.44 - 1.00 mg/dL Final  87/70/7980 95:62 AM 2.09 (H) 0.44 - 1.00 mg/dL Final  87/71/7980 95:95 AM 2.41 (H) 0.44 - 1.00 mg/dL Final  87/72/7980 92:44 PM 2.54 (H) 0.44 - 1.00 mg/dL Final  88/70/7980 94:79 AM 0.86 0.44 - 1.00 mg/dL Final  88/71/7980 94:70 AM 0.83 0.44 - 1.00 mg/dL Final  88/79/7980 97:49 PM 0.93  0.44 - 1.00 mg/dL Final  89/77/7980 95:85 PM 1.19 (H) 0.57 - 1.00 mg/dL Final  90/89/7980 95:73 AM 1.11 (H) 0.44 - 1.00 mg/dL Final    PMH:   Past Medical History:  Diagnosis Date   Anemia    Arthritis    Knee both knees   Blood transfusion without reported diagnosis 2012   anemia;pt denies transfusion stated was only on iron  tablet   Cataract    left   CKD (chronic kidney disease), stage III (HCC)    Diabetes mellitus without complication (HCC)    Family history of anesthesia complication    sister very slow to awaken after anesthesia;severe vomiting    Gout    left elbow   Herpes infection 08/09/2014   Saw doctor Wed. 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  ENDOSCOPY;  Service: Gastroenterology;  Laterality: N/A;   EXCISIONAL TOTAL KNEE ARTHROPLASTY WITH ANTIBIOTIC SPACERS Right 02/16/2015   Procedure: RIGHT KNEE RESECTION ARTHROPLASTY WITH ANTIBIOTIC SPACERS;  Surgeon: Dempsey Moan, MD;  Location: [facility];  Service: Orthopedics;  Laterality: Right;   EXCISIONAL TOTAL KNEE ARTHROPLASTY WITH ANTIBIOTIC SPACERS Right 11/03/2018   Procedure: Right knee resection arthroplasty; antibiotic spacer;  Surgeon: Moan Dempsey, MD;  Location: [facility];  Service: Orthopedics;  Laterality: Right;  Adductor Block   I & D KNEE WITH POLY EXCHANGE Right 10/02/2014   Procedure: IRRIGATION AND DEBRIDEMENT RIGHT KNEE WITH POLY EXCHANGE;  Surgeon: Dempsey Moan GAILS, MD;  Location: [facility];  Service: Orthopedics;  Laterality: Right;   I & D KNEE WITH POLY EXCHANGE Right 08/27/2020   Procedure: IRRIGATION AND DEBRIDEMENT; SPACER EXCHANGE RIGHT KNEE with multiple specimens;  Surgeon: Moan Dempsey, MD;  Location: [facility];  Service: Orthopedics;  Laterality: Right;    INCISION AND DRAINAGE OF WOUND Right 01/14/2017   Procedure: IRRIGATION AND DEBRIDEMENT WOUND;  Surgeon: Dempsey Moan, MD;  Location: [facility];  Service: Orthopedics;  Laterality: Right;  requests   JOINT REPLACEMENT  06/2014   right knee   MYRINGOTOMY WITH TUBE PLACEMENT Left 08/15/2024   Procedure: MYRINGOTOMY WITH LEFT EAR TUBE PLACEMENT;  Surgeon: Roark Rush, MD;  Location: [facility];  Service: ENT;  Laterality: Left;   nasal cauterization  2012   PATELLAR TENDON REPAIR Right 08/11/2014   Procedure: RIGHT PATELLA TENDON REPAIR;  Surgeon: Dempsey Moan GAILS, MD;  Location: [facility];  Service: Orthopedics;  Laterality: Right;   REIMPLANTATION OF TOTAL KNEE Right 05/23/2015   Procedure: RIGHT KNEE ARTHROPLASTY REIMPLANTATION;  Surgeon: Dempsey Moan, MD;  Location: [facility];  Service: Orthopedics;  Laterality: Right;   TOTAL KNEE ARTHROPLASTY Right 07/03/2014   Procedure: RIGHT TOTAL KNEE ARTHROPLASTY;  Surgeon: Dempsey Moan GAILS, MD;  Location:  [facility];  Service: Orthopedics;  Laterality: Right;   TUBAL LIGATION      Allergies:  Allergies  Allergen Reactions   Cefdinir Swelling and Rash    Tolerated cephalosporins many times in the past   Aleve [Naproxen Sodium] Other (See Comments)    Heart races   Dorethia Cashing ] Other (See Comments)    Nose bleeding   Ciprofloxacin  Other (See Comments)    Possible hamstring tendinopathy   Clonidine  Derivatives Other (See Comments)    Dizziness and weakness   Shellfish Allergy Nausea And Vomiting    Medications:   Prior to Admission medications   Medication Sig Start Date End Date Taking? Authorizing Provider  alendronate  (FOSAMAX ) 70 MG tablet TAKE 1 TABLET WEEKLY (TAKE WITH 8OZ OF WATER  30 MINUTES BEFORE BREAKFAST) Patient taking differently: Take 70 mg by mouth every Tuesday. 05/16/24  Yes Zollie Lowers, MD  amoxicillin -clavulanate (AUGMENTIN ) 875-125 MG tablet Take 1 tablet by mouth 2 (two) times daily. 08/19/24  Yes Dennise Lavada POUR, MD  apixaban  (ELIQUIS ) 5 MG TABS tablet Take 2 tablets (10 mg total) by mouth 2 (two) times daily for 7 days, THEN 1 tablet (5 mg total) 2 (two) times daily. Patient taking differently: Take 1 tablet (5mg ) by mouth twice daily. 08/07/24 09/13/24 Yes Darci Pore, MD  aspirin  EC 81 MG tablet Take 1 tablet (81 mg total) by mouth daily. Swallow whole. 08/08/24  Yes Darci Pore, MD  atorvastatin  (LIPITOR ) 40 MG tablet Take 40 mg by mouth at bedtime. 08/08/24  Yes [provider]  azelastine  (ASTELIN ) 0.1 % nasal spray Place 1 spray into both nostrils 2 (two) times daily. Use in each nostril as directed 07/26/24  Yes [facility] Morton Hummer, Nena, NP  ciprofloxacin -dexamethasone  (CIPRODEX ) OTIC suspension Place 4 drops into the left ear 2 (two) times daily. 08/07/24  Yes Darci Pore, MD  colchicine  0.6 MG tablet Take twice daily for gout attack. (may take every two hours up to 6 doses at acute onset) Patient  taking differently: Take 0.6 mg by mouth 2 (two) times daily. 03/03/24  Yes Zollie Lowers, MD  diltiazem  (CARDIZEM  CD) 240 MG 24 hr capsule TAKE ONE CAPSULE BY MOUTH DAILY 05/16/24  Yes Zollie Lowers, MD  empagliflozin  (JARDIANCE ) 10 MG TABS tablet Take 1 tablet (10 mg total) by mouth daily. 09/29/23  Yes Zollie Lowers, MD  ferrous sulfate  325 (65 FE) MG tablet Take 325 mg by mouth 2 (two) times a week. Take one tablet by mouth on Tue & Thurs.   Yes [provider]  hydrALAZINE  (APRESOLINE ) 25 MG tablet TAKE ONE (1) TABLET BY MOUTH 3 TIMES DAILY 07/18/24  Yes Zollie Lowers, MD  HYDROcodone -acetaminophen  (NORCO/VICODIN) 5-325 MG tablet Take 1 tablet by mouth every 6 (six) hours as needed for moderate pain (pain score 4-6). 08/19/24  Yes Singh, Prashant K, MD  hydrocortisone  cream 1 % Apply topically 2 (two) times daily. To left ear 08/19/24  Yes Dennise Lavada POUR, MD  isosorbide  mononitrate (IMDUR ) 60 MG 24 hr tablet Take 1 tablet (60 mg total) by mouth daily. 08/08/24  Yes Darci Pore, MD  ondansetron  (ZOFRAN -ODT) 4 MG disintegrating tablet Take 4 mg by mouth every 8 (eight) hours as needed for vomiting or nausea. 08/08/24  Yes [provider]  amoxicillin -clavulanate (AUGMENTIN ) 500-125 MG tablet Take 1 tablet by mouth 2 (two) times daily for 20 days. 08/29/24 09/18/24  Calone, Gregory D, FNP  doxycycline  (VIBRA -TABS) 100 MG tablet Take 1 tablet (100 mg total) by mouth every  12 (twelve) hours for 20 days. 08/29/24 09/18/24  Calone, Gregory D, FNP  valACYclovir  (VALTREX ) 500 MG tablet Take 1 tablet (500 mg total) by mouth 2 (two) times daily. Patient not taking: Reported on 08/21/2024 08/19/24   Singh, Prashant K, MD    Inpatient medications:  amoxicillin -clavulanate  1 tablet Oral BID   apixaban   5 mg Oral BID   atorvastatin   40 mg Oral QHS   azelastine   1 spray Each Nare BID   carbamide peroxide  5 drop Both EARS BID   ciprofloxacin -dexamethasone   4 drop Left EAR BID    diltiazem   240 mg Oral Daily   doxycycline   100 mg Oral Q12H   famotidine   10 mg Oral Daily   feeding supplement  237 mL Oral BID BM   ferrous sulfate   325 mg Oral Once per day on Monday Thursday   hydrALAZINE   50 mg Oral Q8H   insulin  aspart  0-9 Units Subcutaneous TID WC   isosorbide  mononitrate  60 mg Oral Daily   pantoprazole   40 mg Oral QAC breakfast    Discontinued Meds:   Medications Discontinued During This Encounter  Medication Reason   octreotide  (SANDOSTATIN ) 500 mcg in sodium chloride  0.9 % 250 mL (2 mcg/mL) infusion    oxyCODONE  (Oxy IR/ROXICODONE ) immediate release tablet 5 mg Patient Transfer   oxyCODONE  (ROXICODONE ) 5 MG/5ML solution 5 mg Patient Transfer   fentaNYL  (SUBLIMAZE ) injection 25-50 mcg Patient Transfer   pantoprazole  (PROTONIX ) injection 40 mg    apixaban  (ELIQUIS ) tablet 5 mg    allopurinol  (ZYLOPRIM ) tablet 50 mg    valACYclovir  (VALTREX ) tablet 500 mg    hydrALAZINE  (APRESOLINE ) tablet 25 mg    aspirin  EC tablet 81 mg    ceFEPIme  (MAXIPIME ) 2 g in sodium chloride  0.9 % 100 mL IVPB    vancomycin  (VANCOCIN ) IVPB 1000 mg/200 mL premix    metroNIDAZOLE  (FLAGYL ) tablet 500 mg    famotidine  (PEPCID ) tablet 20 mg    diclofenac  Sodium (VOLTAREN ) 1 % topical gel 2 g    insulin  aspart (novoLOG ) injection 0-9 Units     Social History:  reports that she has never smoked. She has never used smokeless tobacco. She reports that she does not drink alcohol and does not use drugs.  Family History:   Family History  Problem Relation Age of Onset   Ovarian cancer Mother    Cancer Mother    Peripheral vascular disease Father        with amputation of both legs   Hypertension Father    Heart disease Brother 82   Kidney disease Daughter    Heart disease Daughter 66   Diabetes Son    Diabetes Son    Colon cancer Neg Hx    Esophageal cancer Neg Hx    Stomach cancer Neg Hx    Rectal cancer Neg Hx     Weight change:   Intake/Output Summary (Last 24 hours) at  09/01/2024 1257 Last data filed at 09/01/2024 0444 Gross per 24 hour  Intake 413.06 ml  Output 250 ml  Net 163.06 ml   BP 128/82 (BP Location: Right Arm)   Pulse (!) 108   Temp 98.6 F (37 C) (Oral)   Resp 20   Ht 5' 5 (1.651 m)   Wt 99.3 kg   SpO2 90%   BMI 36.43 kg/m  Vitals:   09/01/24 0606 09/01/24 0748 09/01/24 1025 09/01/24 1133  BP: 100/70 113/69 (!) 142/93 128/82  Pulse: 92 73  (!)  108  Resp:  20  20  Temp:  98.3 F (36.8 C)  98.6 F (37 C)  TempSrc:  Oral  Oral  SpO2: 92% 92%  90%  Weight:      Height:         PHYSICAL EXAMS Tired appearing, not in acute distress. Clear bilateral lungs. Heart regular rhythm, no JVD Abdomen soft and nondistended.  Normal bowel sounds. Right lower extremity with edema.  Labs: Basic Metabolic Panel: Recent Labs  Lab 08/26/24 0459 08/27/24 0505 08/28/24 0330 08/29/24 0639 08/31/24 0528 09/01/24 0402  NA 136 135 134* 134* 132* 131*  K 3.7 3.6 4.0 3.7 4.6 4.6  CL 109 106 105 107 104 105  CO2 18* 17* 18* 18* 17* 15*  GLUCOSE 101* 110* 128* 93 150* 126*  BUN 27* 26* 27* 27* 45* 52*  CREATININE 1.98* 1.95* 2.00* 2.12* 3.53* 3.96*  ALBUMIN   --   --   --   --  1.7*  --   CALCIUM  8.0* 8.2* 8.1* 8.0* 7.9* 7.7*   Liver Function Tests: Recent Labs  Lab 08/31/24 0528  AST 16  ALT 10  ALKPHOS 80  BILITOT 0.7  PROT 6.6  ALBUMIN  1.7*   No results for input(s): LIPASE, AMYLASE in the last 168 hours. No results for input(s): AMMONIA in the last 168 hours. CBC: Recent Labs  Lab 08/27/24 0505 08/28/24 0330 08/29/24 0639 08/31/24 0528  WBC 8.1 8.7 7.2 21.4*  HGB 9.3* 9.4* 8.8* 9.2*  HCT 28.9* 29.1* 28.0* 29.2*  MCV 82.6 81.7 83.1 83.2  PLT 472* 499* 476* 545*   PT/INR: @LABRCNTIP (inr:5) Cardiac Enzymes: )No results for input(s): CKTOTAL, CKMB, CKMBINDEX, TROPONINI in the last 168 hours. CBG: Recent Labs  Lab 08/31/24 1120 08/31/24 1602 08/31/24 2049 09/01/24 0747 09/01/24 1133  GLUCAP 174*  173* 125* 132* 180*    Iron  Studies: No results for input(s): IRON , TIBC, TRANSFERRIN, FERRITIN in the last 168 hours.  Xrays/Other Studies: No results found.   Assessment/Plan: AKI on CKD stage IV Elevated anion gap metabolic acidosis. SCr worsened to 3.96 today despite IV fluids administration; baseline SCr 1.9-2.2. Etiology of patient's AKI likely secondary to both prerenal and intrarenal causes. Prerenal component likely from decreased albumin  contributing to third spacing. Suspect intrarenal etiology also from exposure to antibiotics and hemodynamic changes, including softer blood pressures. Will discontinue IV fluids as she appears volume overloaded.  Will get a bladder ultrasound, and a renal ultrasound to rule out postobstructive causes. -Discontinue IV fluids. - Follow-up bladder ultrasound, renal ultrasound. - Start bicarb infusion 150 mEq for 10 hours - Will give 3 doses of albumin . - BMP as above.   Eldon Zietlow 09/01/2024, 12:57 PM

## 2024-09-01 NOTE — Progress Notes (Signed)
 Spoke to Dr. Celinda about continuing telemetry, he stated telemetry not needed at this time.

## 2024-09-01 NOTE — Progress Notes (Signed)
 [facility] PROGRESS NOTE    Progress Note  Beth [name]  FMW:[id number] DOB: 08-29-1946 DOA: 08/21/2024 PCP: Zollie Lowers, MD     Brief Narrative:   Beth [name] is an 78 y.o. female past medical history of recent PE on Eliquis , chronic kidney see stage IV, diabetes mellitus type 2 essential hypertension CVA chronic diastolic dysfunction with a history of infected right knee replacement and placement of antibiotic spacers, recently discharged for otitis media comes in with hematemesis from skilled nursing facility.  There was a concern for septic left elbow so orthopedic surgery was consulted status post aspiration of left elbow by IR   Assessment/Plan:   Hematemesis with nausea GI was consulted endoscopy performed 08/22/2024 that showed 4 cm hiatal hernia some gastritis aspirin  was stopped she was started on Eliquis . Hemoglobin is stable.  Left elbow cellulitis/chronic left knee pain: She adamantly refused intervention of the right knee which will require amputation. Orthopedic surgery was consulted which is now signed off, as she did not want any further intervention. I had a long discussion about goals of care while PT was present. MRI of the left elbow show fluid collection which was aspirated on 08/23/2024 without evidence of crystals.  Culture data remain negative so far. She was continued on antibiotics. ID was consulted recommended change antibiotics to doxycycline  and Augmentin  for 4 weeks follow-up in clinic on 09/27/2023 Okay for DC per ID. Awaiting insurance authorization, currently appealing insurance denial.  Acute kidney injury on chronic kidney disease stage IV With a baseline creatinine around 1.9 to 2.2, creatinine continues to worsen despite IV fluids and discontinuing diclofenac . Continue on IV fluids recheck basic metabolic panel in the morning. Check renal ultrasound. She has had multiple scans without contrast.    Elevated troponin Denies any chest  pain likely demand ischemia cardiology recommended conservative management.  DVT/PE: Without chest pain continue Eliquis .  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 Follow-up with ENT as an outpatient.  Essential hypertension: Continue Cardizem  hydralazine  and Imdur .  Chronic diastolic dysfunction: Repeated 2D echo showed preserved EF unchanged from previous continue current management.  Diabetes mellitus type 2: Continue Jardiance  A1c of 6.5.  History of CVA with residual deficits: On statin and Eliquis .  PT evaluated the patient recommended skilled nursing facility, insurance was denied she is currently appealing.  Goals of care: Patient met with palliative care she remains a full code.  DVT prophylaxis: lovenox  Family Communication:none Status is: Inpatient Remains inpatient appropriate because: Clinical septic arthritis    Code Status:     Code Status Orders  (From admission, onward)           Start     Ordered   08/21/24 2117  Full code  Continuous       Question:  By:  Answer:  Consent: discussion documented in EHR   08/21/24 2119           Code Status History     Date Active Date Inactive Code Status Order ID Comments User Context   08/15/2024 0853 08/19/2024 1839 Full Code [id number]  Claudene Maximino LABOR, MD ED   08/01/2024 0728 08/07/2024 2132 Full Code [id number]  Claudene Maximino LABOR, MD Inpatient   08/27/2020 1021 08/29/2020 2008 Full Code [id number]  Melodi Lerner, MD Inpatient   12/03/2018 2300 12/09/2018 2221 Full Code [id number]  Tobie Jorie SAUNDERS, MD  ED   11/03/2018 1812 11/09/2018 1842 Full Code [id number]  Melodi Lerner, MD Inpatient   08/06/2018 1942 08/17/2018 1903 Full Code [id number]  Tomasita Gauze Inpatient   01/14/2017 1928 01/15/2017 1532 Full Code [id number]  Melodi Lerner, MD Inpatient    02/28/2016 1955 03/01/2016 2012 Full Code [id number]  Hilma Rankins, MD ED   05/23/2015 2231 05/26/2015 1825 Full Code [id number]  Melodi Lerner, MD Inpatient   02/16/2015 2024 02/19/2015 1935 Full Code [id number]  Melodi Lerner, MD Inpatient   10/02/2014 1745 10/05/2014 1606 Full Code [id number]  Melodi Lerner GAILS, MD Inpatient   08/11/2014 2106 08/14/2014 1626 Full Code [id number]  Melodi Lerner GAILS, MD Inpatient   07/03/2014 1554 07/05/2014 1816 Full Code [id number]  Melodi Lerner GAILS, MD Inpatient      Advance Directive Documentation    Flowsheet Row Most Recent Value  Type of Advance Directive Healthcare Power of Attorney  Pre-existing out of facility DNR order (yellow form or pink MOST form) --  MOST Form in Place? --      IV Access:   Peripheral IV   Procedures and diagnostic studies:   No results found.   Medical Consultants:   None.   Subjective:    [name] has no complaints this morning.,  Still relates she does not want surgery.  Will talk to my family about her options  Objective:    Vitals:   08/31/24 2313 09/01/24 0443 09/01/24 0606 09/01/24 0748  BP: 117/89 105/64 100/70 113/69  Pulse: 93 (!) 50 92 73  Resp: 16 18  20   Temp: 98.5 F (36.9 C) 97.9 F (36.6 C)  98.3 F (36.8 C)  TempSrc: Oral Oral  Oral  SpO2: 91% 94% 92% 92%  Weight:      Height:       SpO2: 92 % O2 Flow Rate (L/min): 2 L/min FiO2 (%): 100 %   Intake/Output Summary (Last 24 hours) at 09/01/2024 0937 Last data filed at 09/01/2024 0444 Gross per 24 hour  Intake 413.06 ml  Output 250 ml  Net 163.06 ml   Filed Weights   08/22/24 0839  Weight: 99.3 kg    Exam: General exam: In no acute distress. Respiratory system: Good air movement and clear to auscultation. Cardiovascular system: S1 & S2 heard, RRR. No JVD. Gastrointestinal system: Abdomen is nondistended, soft and nontender.  Extremities: No pedal edema. Skin: No rashes, lesions or ulcers Psychiatry: Judgement and insight  appear normal. Mood & affect appropriate.  Data Reviewed:    Labs: Basic Metabolic Panel: Recent Labs  Lab 08/26/24 0459 08/27/24 0505 08/28/24 0330 08/29/24 0639 08/31/24 0528 09/01/24 0402  NA 136 135 134* 134* 132* 131*  K 3.7 3.6 4.0 3.7 4.6 4.6  CL 109 106 105 107 104 105  CO2 18* 17* 18* 18* 17* 15*  GLUCOSE 101* 110* 128* 93 150* 126*  BUN 27* 26* 27* 27* 45* 52*  CREATININE 1.98* 1.95* 2.00* 2.12* 3.53* 3.96*  CALCIUM  8.0* 8.2* 8.1* 8.0* 7.9* 7.7*  MG 1.9 1.9 2.0 1.9  --   --    GFR Estimated Creatinine Clearance: 13.2 mL/min (A) (by C-G formula based on SCr of 3.96 mg/dL (H)). Liver Function Tests: Recent Labs  Lab 08/31/24 0528  AST 16  ALT 10  ALKPHOS 80  BILITOT 0.7  PROT 6.6  ALBUMIN  1.7*   No results for input(s): LIPASE, AMYLASE in the last 168 hours. No results for input(s): AMMONIA in the last 168 hours. Coagulation profile No  results for input(s): INR, PROTIME in the last 168 hours. COVID-19 Labs  No results for input(s): DDIMER, FERRITIN, LDH, CRP in the last 72 hours.  Lab Results  Component Value Date   SARSCOV2NAA NEGATIVE 08/21/2024   SARSCOV2NAA NEGATIVE 08/14/2024   SARSCOV2NAA NEGATIVE 08/01/2024   SARSCOV2NAA NEGATIVE 08/23/2020    CBC: Recent Labs  Lab 08/26/24 0459 08/27/24 0505 08/28/24 0330 08/29/24 0639 08/31/24 0528  WBC 6.7 8.1 8.7 7.2 21.4*  HGB 8.9* 9.3* 9.4* 8.8* 9.2*  HCT 28.2* 28.9* 29.1* 28.0* 29.2*  MCV 83.7 82.6 81.7 83.1 83.2  PLT 415* 472* 499* 476* 545*   Cardiac Enzymes: No results for input(s): CKTOTAL, CKMB, CKMBINDEX, TROPONINI in the last 168 hours. BNP (last 3 results) No results for input(s): PROBNP in the last 8760 hours. CBG: Recent Labs  Lab 08/31/24 0801 08/31/24 1120 08/31/24 1602 08/31/24 2049 09/01/24 0747  GLUCAP 167* 174* 173* 125* 132*   D-Dimer: No results for input(s): DDIMER in the last 72 hours. Hgb A1c: No results for input(s): HGBA1C  in the last 72 hours. Lipid Profile: No results for input(s): CHOL, HDL, LDLCALC, TRIG, CHOLHDL, LDLDIRECT in the last 72 hours. Thyroid  function studies: No results for input(s): TSH, T4TOTAL, T3FREE, THYROIDAB in the last 72 hours.  Invalid input(s): FREET3 Anemia work up: No results for input(s): VITAMINB12, FOLATE, FERRITIN, TIBC, IRON , RETICCTPCT in the last 72 hours. Sepsis Labs: Recent Labs  Lab 08/27/24 0505 08/28/24 0330 08/29/24 0639 08/31/24 0528  WBC 8.1 8.7 7.2 21.4*   Microbiology Recent Results (from the past 240 hours)  Body fluid culture w Gram Stain     Status: None   Collection Time: 08/23/24  4:12 PM   Specimen: Joint, Left Elbow; Body Fluid  Result Value Ref Range Status   Specimen Description SYNOVIAL  Final   Special Requests LEFT ELBOW  Final   Gram Stain   Final    FEW WBC PRESENT, PREDOMINANTLY PMN NO ORGANISMS SEEN    Culture   Final    NO GROWTH 3 DAYS Performed at [facility], [address], [place], [place] [address]    Report Status 08/26/2024 FINAL  Final     Medications:    amoxicillin -clavulanate  1 tablet Oral BID   apixaban   5 mg Oral BID   atorvastatin   40 mg Oral QHS   azelastine   1 spray Each Nare BID   carbamide peroxide  5 drop Both EARS BID   ciprofloxacin -dexamethasone   4 drop Left EAR BID   diltiazem   240 mg Oral Daily   doxycycline   100 mg Oral Q12H   famotidine   10 mg Oral Daily   feeding supplement  237 mL Oral BID BM   ferrous sulfate   325 mg Oral Once per day on Monday Thursday   hydrALAZINE   50 mg Oral Q8H   insulin  aspart  0-9 Units Subcutaneous TID WC   isosorbide  mononitrate  60 mg Oral Daily   pantoprazole   40 mg Oral QAC breakfast   Continuous Infusions:    LOS: 11 days   Erle Odell Castor  [facility]  09/01/2024, 9:37 AM

## 2024-09-01 NOTE — Progress Notes (Addendum)
 Palliative Medicine Inpatient Follow Up Note HPI: Beth [name] is an 78 y.o. female past medical history of recent PE on Eliquis , chronic kidney see stage IV, diabetes mellitus type 2 essential hypertension CVA chronic diastolic dysfunction with a history of infected right knee replacement and placement of antibiotic spacers, recently discharged for otitis media comes in with hematemesis from skilled nursing facility.  There was a concern for septic left elbow so orthopedic surgery was consulted status post aspiration of left elbow by IR. Palliative care has been asked to support additional goals of care conversations.   Today's Discussion 09/01/2024  *Please note that this is a verbal dictation therefore any spelling or grammatical errors are due to the Dragon Medical One system interpretation.  Chart reviewed inclusive of vital signs, progress notes, laboratory results, and diagnostic images. Renal ultrasound will be pursued for her AKI.   I met with Beth [name] at bedside this morning. She shares that she is not feeling terribly well. She is having increased pain in her knees per her nurse, Beth [name] who came to bedside. Beth [name] has a tendency to under rate her pain score.   Created space and opportunity for patient to explore thoughts feelings and fears regarding current medical situation. We discussed potential outcomes from hospitalization. We reviewed the need for more help from a functional perspective.   Discussed again, cardiopulmonary resuscitation status. At this time patient has elected to be a DNAR/DNI code status.   I called and spoke with patients daughter, Beth [name] and discussed with her the above. She and I reviewed the options if patient neglects to be accepted into facility inclusive of conversations regarding hospice. While on the phone patients son, Beth [name] called. I was able to hang up the phone with Beth [name].  Beth [name] and I discussed Beth [name]'s present state and the short term and long  term concerns in the setting of her frailty and increased care needs. We reviewed the idea of hospice should Beth [name] continue to decline. I described hospice as a service for patients who have a life expectancy of 6 months or less.  The goal of hospice is the preservation of dignity and quality at the end phases of life. Under hospice care, the focus changes from curative to symptom relief.   Beth [name] would like to speak to the primary team to gain better insights on patients hospitalization and overall prognosis. I shared that I would reach out to Dr. Celinda.   Questions and concerns addressed/Palliative Support Provided.   Objective Assessment: Vital Signs Vitals:   09/01/24 1025 09/01/24 1133  BP: (!) 142/93 128/82  Pulse:  (!) 108  Resp:  20  Temp:  98.6 F (37 C)  SpO2:  90%    Intake/Output Summary (Last 24 hours) at 09/01/2024 1348 Last data filed at 09/01/2024 0444 Gross per 24 hour  Intake 413.06 ml  Output 250 ml  Net 163.06 ml   Last Weight  Most recent update: 08/22/2024  8:41 AM    Weight  99.3 kg (218 lb 14.7 oz)            Gen:  Elderly AA F chronically ill in appearance HEENT: moist mucous membranes CV: Regular rate and rhythm  PULM: On RA, breathing is even and nonlabored ABD: soft/nontender  EXT: LUE edema Neuro: Alert and oriented x3   SUMMARY OF RECOMMENDATIONS   DNAR/DNI  Allow time for outcomes  Awaiting appeal to see if patient will be placed  Financial counseling is working with patients family  for [facility] discussed with patients two children  Ongoing PMT support ______________________________________________________________________________________ Beth [name] [facility] Palliative Medicine Team Team Cell Phone: [phone] Please utilize secure chat with additional questions, if there is no response within 30 minutes please call the above phone number  Time: 109 Billing based on MDM: High  Palliative Medicine Team  providers are available by phone from 7am to 7pm daily and can be reached through the team cell phone.  Should this patient require assistance outside of these hours, please call the patient's attending physician.

## 2024-09-01 NOTE — TOC Progression Note (Addendum)
 Transition of Care [facility]) - Progression Note    Patient Details  Name: Beth [name] MRN: [id number] Date of Birth: 1946/10/26  [facility] CM/SW Contact  Isaiah Public, LCSWA Phone Number: 09/01/2024, 10:44 AM  Clinical Narrative:     CSW spoke with [name] regarding appeal status for SNF for patient. [name] request for CSW to call her brother Signe to follow up on status of appeal. CSW spoke with Signe who confirmed if he hears determination from appeal today he will follow up with CSW. CSW will continue to follow.  Update- CSW spoke with [name] with [name] who informed CSW that 1st level appeal was upheld so it automatically went to a 2nd level appeal which is still pending. [name] with [name] informed CSW the determination for 2nd level appeal could take a couple days before determination. CSW updated [name].  Expected Discharge Plan: Skilled Nursing Facility Barriers to Discharge: Continued Medical Work up               Expected Discharge Plan and Services                                               Social Drivers of Health (SDOH) Interventions SDOH Screenings   Food Insecurity: No Food Insecurity (08/22/2024)  Housing: Low Risk  (08/22/2024)  Transportation Needs: No Transportation Needs (08/22/2024)  Recent Concern: Transportation Needs - Unmet Transportation Needs (07/20/2024)   Received from [facility]  Utilities: Not At Risk (08/22/2024)  Alcohol Screen: Low Risk  (12/30/2023)  Depression (PHQ2-9): Medium Risk (07/20/2024)  Financial Resource Strain: Low Risk  (07/20/2024)   Received from [facility]  Physical Activity: Inactive (07/20/2024)   Received from [facility]  Social Connections: Moderately Integrated (08/22/2024)  Recent Concern: Social Connections - Socially Isolated (07/20/2024)   Received from [facility]  Stress: No Stress Concern Present (07/20/2024)   Received from [facility]  Tobacco Use: Low Risk   (08/22/2024)  Health Literacy: Low Risk  (07/20/2024)   Received from [facility]    Readmission Risk Interventions     No data to display

## 2024-09-02 DIAGNOSIS — Z7189 Other specified counseling: Secondary | ICD-10-CM | POA: Diagnosis not present

## 2024-09-02 DIAGNOSIS — Z66 Do not resuscitate: Secondary | ICD-10-CM | POA: Diagnosis not present

## 2024-09-02 DIAGNOSIS — Z515 Encounter for palliative care: Secondary | ICD-10-CM | POA: Diagnosis not present

## 2024-09-02 LAB — CBC
HCT: 23 % — ABNORMAL LOW (ref 36.0–46.0)
Hemoglobin: 7.1 g/dL — ABNORMAL LOW (ref 12.0–15.0)
MCH: 26.1 pg (ref 26.0–34.0)
MCHC: 30.9 g/dL (ref 30.0–36.0)
MCV: 84.6 fL (ref 80.0–100.0)
Platelets: 468 K/uL — ABNORMAL HIGH (ref 150–400)
RBC: 2.72 MIL/uL — ABNORMAL LOW (ref 3.87–5.11)
RDW: 17.9 % — ABNORMAL HIGH (ref 11.5–15.5)
WBC: 15.6 K/uL — ABNORMAL HIGH (ref 4.0–10.5)
nRBC: 0.3 % — ABNORMAL HIGH (ref 0.0–0.2)

## 2024-09-02 LAB — RENAL FUNCTION PANEL
Albumin: 3.4 g/dL — ABNORMAL LOW (ref 3.5–5.0)
Anion gap: 16 — ABNORMAL HIGH (ref 5–15)
BUN: 62 mg/dL — ABNORMAL HIGH (ref 8–23)
CO2: 14 mmol/L — ABNORMAL LOW (ref 22–32)
Calcium: 7.7 mg/dL — ABNORMAL LOW (ref 8.9–10.3)
Chloride: 102 mmol/L (ref 98–111)
Creatinine, Ser: 4.75 mg/dL — ABNORMAL HIGH (ref 0.44–1.00)
GFR, Estimated: 9 mL/min — ABNORMAL LOW (ref >60)
Glucose, Bld: 136 mg/dL — ABNORMAL HIGH (ref 70–99)
Phosphorus: 5 mg/dL — ABNORMAL HIGH (ref 2.5–4.6)
Potassium: 4.7 mmol/L (ref 3.5–5.1)
Sodium: 132 mmol/L — ABNORMAL LOW (ref 135–145)

## 2024-09-02 LAB — GLUCOSE, CAPILLARY
Glucose-Capillary: 155 mg/dL — ABNORMAL HIGH (ref 70–99)
Glucose-Capillary: 134 mg/dL — ABNORMAL HIGH (ref 70–99)

## 2024-09-02 MED ORDER — LORAZEPAM 2 MG/ML PO CONC
1.0000 mg | ORAL | Status: DC | PRN
  Filled 2024-09-02: qty 0.5

## 2024-09-02 MED ORDER — LORAZEPAM 1 MG PO TABS: 1.0000 mg | ORAL_TABLET | ORAL | Status: DC | PRN

## 2024-09-02 MED ORDER — LORAZEPAM 2 MG/ML IJ SOLN
1.0000 mg | INTRAMUSCULAR | Status: DC | PRN
  Administered 2024-09-03: 1 mg via INTRAVENOUS
  Filled 2024-09-02: qty 1

## 2024-09-02 MED ORDER — HALOPERIDOL LACTATE 5 MG/ML IJ SOLN: 0.5000 mg | INTRAMUSCULAR | Status: DC | PRN

## 2024-09-02 MED ORDER — HYDROCODONE-ACETAMINOPHEN 10-325 MG PO TABS: 1.0000 | ORAL_TABLET | ORAL | Status: DC | PRN

## 2024-09-02 MED ORDER — HALOPERIDOL LACTATE 2 MG/ML PO CONC: 0.5000 mg | ORAL | Status: DC | PRN

## 2024-09-02 MED ORDER — FENTANYL CITRATE PF 50 MCG/ML IJ SOSY
25.0000 ug | PREFILLED_SYRINGE | Freq: Once | INTRAMUSCULAR | Status: AC
  Administered 2024-09-02: 25 ug via INTRAVENOUS
  Filled 2024-09-02: qty 1

## 2024-09-02 MED ORDER — FENTANYL CITRATE PF 50 MCG/ML IJ SOSY
50.0000 ug | PREFILLED_SYRINGE | INTRAMUSCULAR | Status: DC | PRN
  Administered 2024-09-02: 50 ug via INTRAVENOUS
  Filled 2024-09-02: qty 1

## 2024-09-02 MED ORDER — HALOPERIDOL 0.5 MG PO TABS: 0.5000 mg | ORAL_TABLET | ORAL | Status: DC | PRN

## 2024-09-02 MED ORDER — FENTANYL CITRATE PF 50 MCG/ML IJ SOSY: 25.0000 ug | PREFILLED_SYRINGE | Freq: Once | INTRAMUSCULAR | Status: DC

## 2024-09-02 MED ORDER — FENTANYL CITRATE PF 50 MCG/ML IJ SOSY
50.0000 ug | PREFILLED_SYRINGE | Freq: Once | INTRAMUSCULAR | Status: AC
Start: 2024-09-02 — End: 2024-09-02
  Administered 2024-09-02: 50 ug via INTRAVENOUS
  Filled 2024-09-02: qty 1

## 2024-09-02 MED ORDER — FENTANYL CITRATE PF 50 MCG/ML IJ SOSY
50.0000 ug | PREFILLED_SYRINGE | INTRAMUSCULAR | Status: DC | PRN
  Administered 2024-09-02 (×2): 100 ug via INTRAVENOUS
  Administered 2024-09-02: 50 ug via INTRAVENOUS
  Administered 2024-09-02: 100 ug via INTRAVENOUS
  Administered 2024-09-02 (×2): 50 ug via INTRAVENOUS
  Administered 2024-09-03 (×3): 100 ug via INTRAVENOUS
  Filled 2024-09-02 (×5): qty 2
  Filled 2024-09-02 (×3): qty 1
  Filled 2024-09-02: qty 2
  Filled 2024-09-02: qty 1

## 2024-09-02 NOTE — Progress Notes (Addendum)
 [facility] PROGRESS NOTE    Progress Note  Beth [name]  FMW:[id number] DOB: [birth year] DOA: 08/21/2024 PCP: Zollie Lowers, MD     Brief Narrative:   Beth [name] is an 78 y.o. female past medical history of recent PE on Eliquis , chronic kidney see stage IV, diabetes mellitus type 2 essential hypertension CVA chronic diastolic dysfunction with a history of infected right knee replacement and placement of antibiotic spacers, recently discharged for otitis media comes in with hematemesis from skilled nursing facility.  There was a concern for septic left elbow so orthopedic surgery was consulted status post aspiration of left elbow by IR   Assessment/Plan:   Hematemesis with nausea GI was consulted endoscopy performed 08/22/2024 that showed 4 cm hiatal hernia some gastritis aspirin  was stopped she was started on Eliquis . Hemoglobin is stable.  Left elbow cellulitis/chronic left knee pain: She adamantly refused intervention of the right knee which will require amputation. Orthopedic surgery was consulted which is now signed off, as she did not want any further intervention. ID was consulted recommended change antibiotics to doxycycline  and Augmentin  for 4 weeks follow-up in clinic on 09/27/2023 Okay for DC per ID. Spoke to daughter about extremely poor prognosis.  And how she will eventually fall into deep sleep with her deteriorating renal function.   Acute kidney injury on chronic kidney disease stage IV With a baseline creatinine around 1.9 to 2.2, creatinine continues to worsen despite IV fluids. Renal was consulted appreciate assistance. Patient is anuric only about 250 cc of urine output. Her renal function continues to deteriorate. Renal ultrasound showed chronic medical disease no obstruction. She has an extremely poor prognosis  New acute respiratory failure with hypoxia likely due to pulmonary edema: IV fluids were KVO, she appears fluid overloaded on physical  exam. Chest x-ray showed mild pulmonary edema. She is now requiring 2 L of oxygen to keep saturations greater than 90%. She was given IV Lasix  with poor response. I have informed the daughter of her poor prognosis. Was palliative care to see again try to move to comfort measures if no improvement.  Elevated troponin Denies any chest pain likely demand ischemia cardiology recommended conservative management.  DVT/PE: Without chest pain continue Eliquis .  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 Follow-up with ENT as an outpatient.  Essential hypertension: Continue Cardizem  hydralazine  and Imdur .  Chronic diastolic dysfunction: Repeated 2D echo showed preserved EF unchanged from previous continue current management.  Diabetes mellitus type 2: Continue Jardiance  A1c of 6.5.  History of CVA with residual deficits: On statin and Eliquis .  PT evaluated the patient recommended skilled nursing facility, insurance was denied she is currently appealing.  Goals of care: Patient met with palliative care she is now DNR/DNI. Palliative care to continue to work with family to see if we can move towards comfort measures as she is in extreme poor prognosis she would most arrive this admission.  DVT prophylaxis: lovenox  Family Communication:none Status is: Inpatient Remains inpatient appropriate because: Clinical septic arthritis    Code Status:     Code Status Orders  (From admission, onward)           Start     Ordered   08/21/24 2117  Full code  Continuous       Question:  By:  Answer:  Consent: discussion documented in EHR   08/21/24  2119           Code Status History     Date Active Date Inactive Code Status Order ID Comments User Context   08/15/2024 0853 08/19/2024 1839 Full Code [id number]  Claudene Maximino LABOR, MD ED   08/01/2024 [phone] 08/07/2024 2132 Full Code [id number]  Claudene Maximino LABOR, MD Inpatient   08/27/2020 1021 08/29/2020 2008 Full Code [id number]  Melodi Lerner, MD Inpatient   12/03/2018 2300 12/09/2018 2221 Full Code [id number]  Tobie Jorie SAUNDERS, MD ED   11/03/2018 1812 11/09/2018 1842 Full Code [id number]  Melodi Lerner, MD Inpatient   08/06/2018 1942 08/17/2018 1903 Full Code [id number]  Tomasita Gauze Inpatient   01/14/2017 1928 01/15/2017 1532 Full Code [id number]  Melodi Lerner, MD Inpatient   02/28/2016 1955 03/01/2016 2012 Full Code [id number]  Hilma Rankins, MD ED   05/23/2015 2231 05/26/2015 1825 Full Code [id number]  Melodi Lerner, MD Inpatient   02/16/2015 2024 02/19/2015 1935 Full Code [id number]  Melodi Lerner, MD Inpatient   10/02/2014 1745 10/05/2014 1606 Full Code [id number]  Melodi Lerner GAILS, MD Inpatient   08/11/2014 2106 08/14/2014 1626 Full Code [id number]  Melodi Lerner GAILS, MD Inpatient   07/03/2014 1554 07/05/2014 1816 Full Code [id number]  Melodi Lerner GAILS, MD Inpatient      Advance Directive Documentation    Flowsheet Row Most Recent Value  Type of Advance Directive Healthcare Power of Attorney  Pre-existing out of facility DNR order (yellow form or pink MOST form) --  MOST Form in Place? --      IV Access:   Peripheral IV   Procedures and diagnostic studies:   DG CHEST PORT 1 VIEW Result Date: 09/01/2024 CLINICAL DATA:  Dyspnea EXAM: PORTABLE CHEST 1 VIEW COMPARISON:  08/21/2024 FINDINGS: Shallow inspiration. Prominent cardiac enlargement. Perihilar infiltrates likely edema. This is progressing since previous study. Atelectasis or consolidation superimposed in the left lung base with probable left pleural effusion. This is increasing. No pneumothorax. Mediastinal contours appear intact. Calcification of the aorta. Degenerative changes in the spine. IMPRESSION: Cardiac enlargement with perihilar edema, left pleural effusion, and left basilar consolidation. Changes are  progressing since prior study. Electronically Signed   By: [name]   On: 09/01/2024 19:45   US  RENAL Result Date: 09/01/2024 CLINICAL DATA:  Acute kidney injury. EXAM: RENAL / URINARY TRACT ULTRASOUND COMPLETE COMPARISON:  None Available. FINDINGS: Right Kidney: Renal measurements: 8.3 x 3.4 x 4.5 cm = volume: 67 mL. Echogenic cortex consistent with chronic kidney disease. No hydronephrosis or focal lesion. Left Kidney: Renal measurements: 8.7 x 4.1 x 3.8 cm = volume: 70 mL. Echogenic cortex consistent with chronic kidney disease. No hydronephrosis or focal lesion. Bladder: The bladder is decompressed. Other: None. IMPRESSION: Small echogenic kidneys consistent with chronic kidney disease. No hydronephrosis. Electronically Signed   By: [name]   On: 09/01/2024 16:06     Medical Consultants:   None.   Subjective:    Beth [name] complaining of back pain.  Objective:    Vitals:   09/01/24 1838 09/01/24 1951 09/02/24 0003 09/02/24 0411  BP: 124/82 109/84 93/65 (!) 113/90  Pulse: 100 (!) 45 61 (!) 57  Resp: (!) 24 20 18 20   Temp:  98 F (36.7 C) 97.9 F (36.6 C) 98 F (36.7 C)  TempSrc:  Oral Oral Oral  SpO2: 96% 97% 98% 94%  Weight:      Height:       SpO2: 94 % O2 Flow Rate (L/min):  2 L/min FiO2 (%): 100 %   Intake/Output Summary (Last 24 hours) at 09/02/2024 0614 Last data filed at 09/01/2024 1749 Gross per 24 hour  Intake 363.67 ml  Output --  Net 363.67 ml   Filed Weights   08/22/24 0839  Weight: 99.3 kg    Exam: General exam: In no acute distress. Respiratory system: Good air movement and clear to auscultation. Cardiovascular system: S1 & S2 heard, RRR. No JVD. Gastrointestinal system: Abdomen is nondistended, soft and nontender.  Skin: No rashes, lesions or ulcers Psychiatry: Judgement and insight appear normal. Mood & affect appropriate.  Data Reviewed:    Labs: Basic Metabolic Panel: Recent Labs  Lab 08/27/24 0505  08/28/24 0330 08/29/24 0639 08/31/24 0528 09/01/24 0402 09/02/24 0442  NA 135 134* 134* 132* 131* 132*  K 3.6 4.0 3.7 4.6 4.6 4.7  CL 106 105 107 104 105 102  CO2 17* 18* 18* 17* 15* 14*  GLUCOSE 110* 128* 93 150* 126* 136*  BUN 26* 27* 27* 45* 52* 62*  CREATININE 1.95* 2.00* 2.12* 3.53* 3.96* 4.75*  CALCIUM  8.2* 8.1* 8.0* 7.9* 7.7* 7.7*  MG 1.9 2.0 1.9  --   --   --   PHOS  --   --   --   --   --  5.0*   GFR Estimated Creatinine Clearance: 11 mL/min (A) (by C-G formula based on SCr of 4.75 mg/dL (H)). Liver Function Tests: Recent Labs  Lab 08/31/24 0528 09/02/24 0442  AST 16  --   ALT 10  --   ALKPHOS 80  --   BILITOT 0.7  --   PROT 6.6  --   ALBUMIN  1.7* 3.4*   No results for input(s): LIPASE, AMYLASE in the last 168 hours. No results for input(s): AMMONIA in the last 168 hours. Coagulation profile No results for input(s): INR, PROTIME in the last 168 hours. COVID-19 Labs  No results for input(s): DDIMER, FERRITIN, LDH, CRP in the last 72 hours.  Lab Results  Component Value Date   SARSCOV2NAA NEGATIVE 08/21/2024   SARSCOV2NAA NEGATIVE 08/14/2024   SARSCOV2NAA NEGATIVE 08/01/2024   SARSCOV2NAA NEGATIVE 08/23/2020    CBC: Recent Labs  Lab 08/28/24 0330 08/29/24 0639 08/31/24 0528 09/01/24 1121 09/02/24 0442  WBC 8.7 7.2 21.4* 16.6* 15.6*  HGB 9.4* 8.8* 9.2* 8.6* 7.1*  HCT 29.1* 28.0* 29.2* 27.6* 23.0*  MCV 81.7 83.1 83.2 83.4 84.6  PLT 499* 476* 545* 527* 468*   Cardiac Enzymes: No results for input(s): CKTOTAL, CKMB, CKMBINDEX, TROPONINI in the last 168 hours. BNP (last 3 results) No results for input(s): PROBNP in the last 8760 hours. CBG: Recent Labs  Lab 08/31/24 2049 09/01/24 0747 09/01/24 1133 09/01/24 1656 09/01/24 2134  GLUCAP 125* 132* 180* 162* 155*   D-Dimer: No results for input(s): DDIMER in the last 72 hours. Hgb A1c: No results for input(s): HGBA1C in the last 72 hours. Lipid Profile: No  results for input(s): CHOL, HDL, LDLCALC, TRIG, CHOLHDL, LDLDIRECT in the last 72 hours. Thyroid  function studies: No results for input(s): TSH, T4TOTAL, T3FREE, THYROIDAB in the last 72 hours.  Invalid input(s): FREET3 Anemia work up: No results for input(s): VITAMINB12, FOLATE, FERRITIN, TIBC, IRON , RETICCTPCT in the last 72 hours. Sepsis Labs: Recent Labs  Lab 08/29/24 0639 08/31/24 0528 09/01/24 1121 09/02/24 0442  WBC 7.2 21.4* 16.6* 15.6*   Microbiology Recent Results (from the past 240 hours)  Body fluid culture w Gram Stain     Status: None  Collection Time: 08/23/24  4:12 PM   Specimen: Joint, Left Elbow; Body Fluid  Result Value Ref Range Status   Specimen Description SYNOVIAL  Final   Special Requests LEFT ELBOW  Final   Gram Stain   Final    FEW WBC PRESENT, PREDOMINANTLY PMN NO ORGANISMS SEEN    Culture   Final    NO GROWTH 3 DAYS Performed at [facility], [address], [place], [place] [address]    Report Status 08/26/2024 FINAL  Final     Medications:    amoxicillin -clavulanate  1 tablet Oral BID   apixaban   5 mg Oral BID   atorvastatin   40 mg Oral QHS   azelastine   1 spray Each Nare BID   carbamide peroxide  5 drop Both EARS BID   ciprofloxacin -dexamethasone   4 drop Left EAR BID   diltiazem   240 mg Oral Daily   doxycycline   100 mg Oral Q12H   famotidine   10 mg Oral Daily   feeding supplement  237 mL Oral BID BM   ferrous sulfate   325 mg Oral Once per day on Monday Thursday   hydrALAZINE   50 mg Oral Q8H   insulin  aspart  0-9 Units Subcutaneous TID WC   isosorbide  mononitrate  60 mg Oral Daily   pantoprazole   40 mg Oral QAC breakfast   Continuous Infusions:    LOS: 12 days   Erle Odell Castor  [facility]  09/02/2024, 6:14 AM

## 2024-09-02 NOTE — TOC Progression Note (Signed)
 Transition of Care [facility]) - Progression Note    Patient Details  Name: Beth [name] MRN: [id number] Date of Birth: 1946/09/16  [facility] CM/SW Contact  Isaiah Public, LCSWA Phone Number: 09/02/2024, 1:51 PM  Clinical Narrative:     CSW received call from Crystal with [name] that patients appeal was upheld. CSW updated [name]. Palliative following. Per MD,Family plans on following up with palliative to discuss patients dc plan, home with  hospice vrs. Residential hospice. TOC will continue to follow.  Expected Discharge Plan: Skilled Nursing Facility Barriers to Discharge: Continued Medical Work up               Expected Discharge Plan and Services                                               Social Drivers of Health (SDOH) Interventions SDOH Screenings   Food Insecurity: No Food Insecurity (08/22/2024)  Housing: Low Risk  (08/22/2024)  Transportation Needs: No Transportation Needs (08/22/2024)  Recent Concern: Transportation Needs - Unmet Transportation Needs (07/20/2024)   Received from [facility]  Utilities: Not At Risk (08/22/2024)  Alcohol Screen: Low Risk  (12/30/2023)  Depression (PHQ2-9): Medium Risk (07/20/2024)  Financial Resource Strain: Low Risk  (07/20/2024)   Received from [facility]  Physical Activity: Inactive (07/20/2024)   Received from [facility]  Social Connections: Moderately Integrated (08/22/2024)  Recent Concern: Social Connections - Socially Isolated (07/20/2024)   Received from [facility]  Stress: No Stress Concern Present (07/20/2024)   Received from [facility]  Tobacco Use: Low Risk  (08/22/2024)  Health Literacy: Low Risk  (07/20/2024)   Received from [facility]    Readmission Risk Interventions     No data to display

## 2024-09-02 NOTE — Progress Notes (Signed)
 After long conversation with the patient and family they would like to move towards comfort measures. She is DNR/DNI.  They want all medications not related to comfort to be stopped. They will let us  know whether they will want to take her home with hospice or to a residential hospice facility.

## 2024-09-02 NOTE — Plan of Care (Signed)
  Problem: Education: Goal: Ability to describe self-care measures that may prevent or decrease complications (Diabetes Survival Skills Education) will improve Outcome: Progressing Goal: Individualized Educational Video(s) Outcome: Progressing   Problem: Coping: Goal: Ability to adjust to condition or change in health will improve Outcome: Progressing   Problem: Fluid Volume: Goal: Ability to maintain a balanced intake and output will improve Outcome: Progressing   Problem: Health Behavior/Discharge Planning: Goal: Ability to identify and utilize available resources and services will improve Outcome: Progressing Goal: Ability to manage health-related needs will improve Outcome: Progressing   Problem: Metabolic: Goal: Ability to maintain appropriate glucose levels will improve Outcome: Progressing   Problem: Nutritional: Goal: Maintenance of adequate nutrition will improve Outcome: Progressing Goal: Progress toward achieving an optimal weight will improve Outcome: Progressing   Problem: Skin Integrity: Goal: Risk for impaired skin integrity will decrease Outcome: Progressing   Problem: Tissue Perfusion: Goal: Adequacy of tissue perfusion will improve Outcome: Progressing   Problem: Clinical Measurements: Goal: Ability to avoid or minimize complications of infection will improve Outcome: Progressing   Problem: Skin Integrity: Goal: Skin integrity will improve Outcome: Progressing   Problem: Education: Goal: Knowledge of General Education information will improve Description: Including pain rating scale, medication(s)/side effects and non-pharmacologic comfort measures Outcome: Progressing   Problem: Health Behavior/Discharge Planning: Goal: Ability to manage health-related needs will improve Outcome: Progressing   Problem: Clinical Measurements: Goal: Ability to maintain clinical measurements within normal limits will improve Outcome: Progressing Goal: Will  remain free from infection Outcome: Progressing Goal: Diagnostic test results will improve Outcome: Progressing Goal: Respiratory complications will improve Outcome: Progressing Goal: Cardiovascular complication will be avoided Outcome: Progressing   Problem: Activity: Goal: Risk for activity intolerance will decrease Outcome: Progressing   Problem: Nutrition: Goal: Adequate nutrition will be maintained Outcome: Progressing   Problem: Coping: Goal: Level of anxiety will decrease Outcome: Progressing   Problem: Elimination: Goal: Will not experience complications related to bowel motility Outcome: Progressing Goal: Will not experience complications related to urinary retention Outcome: Progressing   Problem: Pain Managment: Goal: General experience of comfort will improve and/or be controlled Outcome: Progressing   Problem: Safety: Goal: Ability to remain free from injury will improve Outcome: Progressing   Problem: Skin Integrity: Goal: Risk for impaired skin integrity will decrease Outcome: Progressing

## 2024-09-02 NOTE — Plan of Care (Signed)
 Patient comfort care patient, and in a decline  Problem: Education: Goal: Ability to describe self-care measures that may prevent or decrease complications (Diabetes Survival Skills Education) will improve Outcome: Not Progressing Goal: Individualized Educational Video(s) Outcome: Not Progressing   Problem: Coping: Goal: Ability to adjust to condition or change in health will improve Outcome: Not Progressing   Problem: Fluid Volume: Goal: Ability to maintain a balanced intake and output will improve Outcome: Not Progressing   Problem: Health Behavior/Discharge Planning: Goal: Ability to identify and utilize available resources and services will improve Outcome: Not Progressing Goal: Ability to manage health-related needs will improve Outcome: Not Progressing   Problem: Metabolic: Goal: Ability to maintain appropriate glucose levels will improve Outcome: Not Progressing   Problem: Nutritional: Goal: Maintenance of adequate nutrition will improve Outcome: Not Progressing Goal: Progress toward achieving an optimal weight will improve Outcome: Not Progressing   Problem: Skin Integrity: Goal: Risk for impaired skin integrity will decrease Outcome: Not Progressing   Problem: Tissue Perfusion: Goal: Adequacy of tissue perfusion will improve Outcome: Not Progressing   Problem: Clinical Measurements: Goal: Ability to avoid or minimize complications of infection will improve Outcome: Not Progressing   Problem: Skin Integrity: Goal: Skin integrity will improve Outcome: Not Progressing   Problem: Education: Goal: Knowledge of General Education information will improve Description: Including pain rating scale, medication(s)/side effects and non-pharmacologic comfort measures Outcome: Not Progressing   Problem: Health Behavior/Discharge Planning: Goal: Ability to manage health-related needs will improve Outcome: Not Progressing   Problem: Clinical Measurements: Goal:  Ability to maintain clinical measurements within normal limits will improve Outcome: Not Progressing Goal: Will remain free from infection Outcome: Not Progressing Goal: Diagnostic test results will improve Outcome: Not Progressing Goal: Respiratory complications will improve Outcome: Not Progressing Goal: Cardiovascular complication will be avoided Outcome: Not Progressing   Problem: Activity: Goal: Risk for activity intolerance will decrease Outcome: Not Progressing   Problem: Nutrition: Goal: Adequate nutrition will be maintained Outcome: Not Progressing   Problem: Coping: Goal: Level of anxiety will decrease Outcome: Not Progressing   Problem: Elimination: Goal: Will not experience complications related to bowel motility Outcome: Not Progressing Goal: Will not experience complications related to urinary retention Outcome: Not Progressing   Problem: Pain Managment: Goal: General experience of comfort will improve and/or be controlled Outcome: Not Progressing   Problem: Safety: Goal: Ability to remain free from injury will improve Outcome: Not Progressing   Problem: Skin Integrity: Goal: Risk for impaired skin integrity will decrease Outcome: Not Progressing   Problem: Education: Goal: Knowledge of the prescribed therapeutic regimen will improve Outcome: Not Progressing   Problem: Coping: Goal: Ability to identify and develop effective coping behavior will improve Outcome: Not Progressing   Problem: Clinical Measurements: Goal: Quality of life will improve Outcome: Not Progressing   Problem: Respiratory: Goal: Verbalizations of increased ease of respirations will increase Outcome: Not Progressing   Problem: Role Relationship: Goal: Family's ability to cope with current situation will improve Outcome: Not Progressing Goal: Ability to verbalize concerns, feelings, and thoughts to partner or family member will improve Outcome: Not Progressing   Problem:  Pain Management: Goal: Satisfaction with pain management regimen will improve Outcome: Not Progressing

## 2024-09-03 ENCOUNTER — Other Ambulatory Visit (HOSPITAL_COMMUNITY): Payer: Self-pay

## 2024-09-03 DIAGNOSIS — K920 Hematemesis: Secondary | ICD-10-CM | POA: Diagnosis not present

## 2024-09-07 NOTE — Progress Notes (Addendum)
 Pt passed away this morning=0625 with daughter at bedside.2 RN verified. [facility] called [place]). Post mortem checklist accomplished by this RN. Still awaiting family members at bedside. MD informed.

## 2024-09-07 NOTE — Death Summary Note (Signed)
 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   History of pulmonary embolism   Heart failure with preserved ejection fraction (HCC)   Diabetes type 2, controlled (HCC)   Acute kidney injury superimposed on chronic kidney disease   CKD (chronic kidney disease), stage IV (HCC)   Prolonged QT interval   Anemia, iron  deficiency   HLD (hyperlipidemia)   Essential hypertension   Infection of total right knee replacement   Acute suppurative otitis media of left ear without spontaneous rupture of tympanic membrane   Septic joint of left elbow (HCC)   Anticoagulated   Hospital Course: 78 year old with history of recent PE on Eliquis , CKD stage IV, DM 2, HLD, HTN, CVA, diastolic CHF, obesity, gout, infected total right knee replacement, otitis media presented with hematemesis from [facility]. There is also suspicion for left arm cellulitis/septic arthritis therefore orthopedic consulted. Left elbow aspirated by IR on 9/16.   Assessment and Plan: No notes have been filed under this hospital service. Service: Hospitalist  Hematemesis with nausea and vomiting: GI was consulted perform an EGD that showed 4 cm hiatal hernia gastritis no ulcers. Her hemoglobin remained stable.  Left elbow cellulitis/chronic knee pain: She adamantly refuse intervention which required an amputation. She related she did not want to have an amputation. Orthopedic surgery was consulted but they have signed off as she does not want any further  intervention. ID was consulted recommended to continue antibiotics for 4 to 6 weeks orally.  Acute kidney injury on chronic kidney see stage IV: With a baseline creatinine 1.9-2.2. Despite IV fluids her creatinine continued to worsen. Renal was consulted recommended conservative management she became anuric. His renal function continued. Palliative care was consulted. We had a long conversation with the family and the family decided to move towards comfort measures.  During our conversation the patient was awake and she agreed she did not want any further intervention and she wanted all medications that were not related to comfort discontinued.  Acute respiratory failure with hypoxia due to pulmonary edema: She was started on IV fluids as she became short of breath. She had to be placed on 2 L of oxygen. She was given Lasix  with minimal response.  Elevated cardiac troponins: Likely demand ischemia.  DVT and PE: Facial droop with a left Titus media/external facial nerve root compression Essential hypertension Chronic diastolic dysfunction Diabetes mellitus type 2 History of CVA    The results of significant diagnostics from this hospitalization (including imaging, microbiology, ancillary and laboratory) are listed below for reference.   Significant Diagnostic Studies: DG CHEST PORT 1 VIEW Result Date: 09/01/2024 CLINICAL DATA:  Dyspnea EXAM: PORTABLE CHEST 1 VIEW COMPARISON:  09-01-2024 FINDINGS: Shallow inspiration. Prominent cardiac enlargement. Perihilar infiltrates likely edema. This is progressing since previous study. Atelectasis or consolidation superimposed in the left lung base with probable left pleural effusion. This is increasing. No pneumothorax. Mediastinal contours appear intact. Calcification of the aorta. Degenerative changes in the spine. IMPRESSION: Cardiac enlargement with perihilar edema, left pleural effusion,  and left basilar consolidation. Changes are  progressing since prior study. Electronically Signed   By: [name]   On: 09/01/2024 19:45   US  RENAL Result Date: 09/01/2024 CLINICAL DATA:  Acute kidney injury. EXAM: RENAL / URINARY TRACT ULTRASOUND COMPLETE COMPARISON:  None Available. FINDINGS: Right Kidney: Renal measurements: 8.3 x 3.4 x 4.5 cm = volume: 67 mL. Echogenic cortex consistent with chronic kidney disease. No hydronephrosis or focal lesion. Left Kidney: Renal measurements: 8.7 x 4.1 x 3.8 cm = volume: 70 mL. Echogenic cortex consistent with chronic kidney disease. No hydronephrosis or focal lesion. Bladder: The bladder is decompressed. Other: None. IMPRESSION: Small echogenic kidneys consistent with chronic kidney disease. No hydronephrosis. Electronically Signed   By: [name]   On: 09/01/2024 16:06   DG FLUORO GUIDED NEEDLE PLC ASPIRATION/INJECTION LOC Result Date: 08/23/2024 CLINICAL DATA:  History of chronic right septic knee. History of gout. Having left elbow pain and swelling. Consulted for fluoroscopic guided left elbow aspiration. EXAM: LEFT ELBOW ASPIRATION UNDER FLUOROSCOPY TECHNIQUE: Signed informed consent was obtained from the patient after discussing all risks and benefits of the procedure. Patient was then placed in a prone position with her left arm and elbow over her head so the left elbow was isolated under fluoroscopy. An appropriate skin entrance site was determined. The site was marked, prepped with Betadine , draped in the usual sterile fashion, and infiltrated locally with Lidocaine . A 21 gauge needle was advanced from the lateral aspect of the elbow into the humeroulnar joint space under intermittent fluoroscopy. 1mL Omnipaque  180 injected easily and opacified the joint space. 3mL of blood tinged fluid was then aspirated. Needle was removed and dressing was applied. No immediate complication. FLUOROSCOPY: Radiation Exposure Index (as provided by the fluoroscopic device): 0.80 mGy Kerma  FINDINGS: Opacification of the left elbow joint with contrast confirmed intra-articular position of needle. 3 mL of blood tinged fluid was aspirated, consistent with successful left elbow aspiration. IMPRESSION: Technically successful left elbow joint aspiration. 3 mL blood tinged fluid aspirated and sent to lab. Performed by: Wyatt Pommier, PA-C Electronically Signed   By: Norleen DELENA Kil M.D.   On: 08/23/2024 16:20   MR ELBOW LEFT WO CONTRAST Result Date: 08/22/2024 CLINICAL DATA:  Left elbow swelling and erythema. Septic arthritis suspected. EXAM: MRI OF THE LEFT ELBOW WITHOUT CONTRAST-limited TECHNIQUE: Multiplanar, multisequence MR imaging of the elbow was attempted. Patient was unable to tolerate the examination. Only axial images were obtained, and these are motion degraded. No intravenous contrast was administered. COMPARISON:  CT 08/21/2024 FINDINGS: The study is quite limited by its incomplete nature and motion. There is moderate-sized joint effusion as seen on recent CT. This appears complex with synovial thickening. Underlying advanced ulnar humeral and radiocapitellar degenerative changes with joint space narrowing and osteophytes, also better seen on CT. No definite acute osseous findings are seen. There is soft tissue swelling around the elbow without evidence of focal fluid collection or foreign body. IMPRESSION: 1. Incomplete examination with only motion-degraded axial images obtained. 2. Moderate-sized joint effusion with synovial thickening, nonspecific. Septic arthritis not excluded. Consider joint aspiration. 3. Underlying advanced degenerative changes of the elbow. No definite acute osseous findings. 4. Soft tissue swelling around the elbow without evidence of focal fluid collection or foreign body. Electronically Signed   By: Elsie Perone M.D.   On: 08/22/2024 18:38   UE VENOUS DUPLEX (7am - 7pm) Result Date: 08/22/2024 UPPER VENOUS STUDY  Patient Name:  DELFINA SCHREURS  Date of  Exam:    08/21/2024 Medical Rec #: [id number]     Accession #:    [id number] Date of Birth: Jun 24, 1946     Patient Gender: F Patient Age:   28 years Exam Location:  [facility] Procedure:      VAS US  UPPER EXTREMITY VENOUS DUPLEX Referring Phys: JAYSON PEREYRA --------------------------------------------------------------------------------  Indications: Pain, and Swelling of left upper extremity Risk Factors: 08/06/2024 PE History of gout. Anticoagulation: Eliquis . Limitations: Poor ultrasound/tissue interface, musculoskeletal features and Edema. Comparison Study: No prior upper extremity venous duplex on file Performing Technologist: Alberta Lis RVS  Examination Guidelines: A complete evaluation includes B-mode imaging, spectral Doppler, color Doppler, and power Doppler as needed of all accessible portions of each vessel. Bilateral testing is considered an integral part of a complete examination. Limited examinations for reoccurring indications may be performed as noted.  Right Findings: +----------+------------+---------+-----------+----------+-------+ RIGHT     CompressiblePhasicitySpontaneousPropertiesSummary +----------+------------+---------+-----------+----------+-------+ Subclavian               Yes       No                       +----------+------------+---------+-----------+----------+-------+  Left Findings: +----------+------------+---------+-----------+----------+-------+ LEFT      CompressiblePhasicitySpontaneousPropertiesSummary +----------+------------+---------+-----------+----------+-------+ IJV           Full       Yes       No                       +----------+------------+---------+-----------+----------+-------+ Subclavian               Yes       No                       +----------+------------+---------+-----------+----------+-------+ Axillary                 Yes       No                        +----------+------------+---------+-----------+----------+-------+ Brachial                 Yes       No                       +----------+------------+---------+-----------+----------+-------+ Radial        Full                                          +----------+------------+---------+-----------+----------+-------+ Ulnar         Full                                          +----------+------------+---------+-----------+----------+-------+ Cephalic      Full                                          +----------+------------+---------+-----------+----------+-------+ Basilic                  Yes       No                       +----------+------------+---------+-----------+----------+-------+  Summary:  Right: No evidence of thrombosis in the subclavian.  Left: No obvious evidence of deep vein thrombosis in the visualized veins of the upper extremity. No evidence of superficial vein thrombosis in the visualized veins of the upper extremity. Vessels in the upper arm are tortuous. Doppler waveforms are pulsatile throughout. Cystic structure noted lateral arm near [facility] measuring 2.2 X 1.69 cm, etiology unknown. Heterogenous area noted lateral proximal forearm, etiology unknown. Further imaging may be warranted if clinically indicated.Limited visualization secondary to limitations stated above.  *See table(s) above for measurements and observations.  Diagnosing physician: Debby Robertson Electronically signed by Debby Robertson on 08/22/2024 at 11:35:28 AM.    Final    DG Knee 1-2 Views Left Result Date: 08/22/2024 CLINICAL DATA:  855384 Pain [id number]. EXAM: LEFT KNEE - 1-2 VIEW COMPARISON:  None Available. FINDINGS: No acute fracture or dislocation. No aggressive osseous lesion. There are degenerative changes of the knee joint in the form of moderately reduced tibiofemoral compartment joint space, along with tibial spiking and tricompartmental osteophytosis. No knee effusion or focal soft  tissue swelling. No radiopaque foreign bodies. IMPRESSION: No acute osseous abnormality of the left knee joint. 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 Electronically Signed   By: Ree Molt M.D.   On: 08/22/2024 11:07   CT ELBOW LEFT WO CONTRAST Result Date: 08/21/2024 CLINICAL DATA:  Soft tissue infection suspected, elbow, no prior imaging us  showing cystic mass, exam showing redness and swelling on the medial aspect above the elbow EXAM: CT OF THE UPPER LEFT EXTREMITY WITHOUT CONTRAST TECHNIQUE: Multidetector CT imaging of the upper left extremity was performed according to the standard protocol. RADIATION DOSE REDUCTION: This exam was performed according to the departmental dose-optimization program which includes automated exposure control, adjustment of the mA and/or kV according to patient size and/or use of iterative reconstruction technique. COMPARISON:  None Available. FINDINGS: Bones/Joint/Cartilage No cortical erosion or destruction. No evidence of fracture or dislocation. Multiloculated complex high-density elbow joint effusion that may represent blood products versus infection. Severe degenerative changes of the elbow. Ligaments Suboptimally assessed by CT. Muscles and Tendons Grossly unremarkable. Soft tissues Dorsal elbow subcutaneus soft tissue edema. No subcutaneus soft tissue emphysema. No organized fluid collection. IMPRESSION: 1. Multiloculated complex high density elbow effusion that may represent hemarthrosis versus septic joint. 2. Severe degenerative changes of the elbow. 3.  No acute displaced fracture or dislocation. 4.  No radiographic findings to suggest osteomyelitis. Electronically Signed   By: Morgane  Naveau M.D.   On: 08/21/2024 21:45   CT Knee Right Wo Contrast Result Date: 08/21/2024 CLINICAL DATA:  Hardware failure, right knee EXAM: CT OF THE RIGHT KNEE WITHOUT CONTRAST TECHNIQUE: Multidetector CT imaging of the right knee was performed according  to the standard protocol. Multiplanar CT image reconstructions were also generated. RADIATION DOSE REDUCTION: This exam was performed according to the departmental dose-optimization program which includes automated exposure control, adjustment of the mA and/or kV according to patient size and/or use of iterative reconstruction technique. 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 Two threaded rods are present, 1 of which extends from the pre tibial subcutaneous tissues below the tibial tubercle and into the tibial component of the methacrylate proximally to the level of a transverse lucent plane through the methacrylate as shown on image 71  series 7. Second rod extends from the native bone proximal tibia through the tibial methacrylate but the rod is fractured at the level of a lucent plane between the tibial and the femoral component of the methacrylate with 8 mm of displacement as shown on image 67 series 7. The distal fragment continues through the femoral cavity component of the spacer in which it terminates near the proximal tip of the spacer. The femoral component of the spacer is separated from the native bone by lucency of variable thickness, between 2 and 6 mm. The tibial component is closely applied to the anterior tibia but has up to 6 mm of lucency around its posteromedial margin. With the fracture in the rod along the plane between the femoral and tibial component of the spacer, there is resulting pseudoarticulation with a non rigid attachment as shown on image 67 series 7. The patellar is flattened with associated volume loss. Capsular calcifications anteriorly in the knee joint and scattered elsewhere in the knee joint. Ligaments Suboptimally assessed by CT.  Muscles and Tendons Severe regional muscular atrophy. Soft tissues Subcutaneous edema anterior to the patella and patellar tendon region. Atheromatous vascular calcifications. IMPRESSION: 1. Antibiotic impregnated spacer in the distal femoral and proximal tibial cavities is partially fragmented, and the traversing threaded rod is fractured resulting in a pseudoarticulation. 2. The femoral component of the spacer is separated from the native bone by lucency of variable thickness, between 2 and 6 mm. 3. The tibial component is closely applied to the anterior tibia but has up to 6 mm of lucency around its posteromedial margin. 4. The patellar is flattened with associated volume loss. 5. Capsular calcifications anteriorly in the knee joint and scattered elsewhere in the knee joint. 6. Severe regional muscular atrophy. 7. Subcutaneous edema anterior to the patella and patellar tendon region. Electronically Signed   By: Ryan Salvage M.D.   On: 08/21/2024 18:38   CT Temporal Bones Wo Contrast Result Date: 08/21/2024 EXAM: CT TEMPORAL BONES WITHOUT CONTRAST 08/21/2024 03:26:31 PM TECHNIQUE: CT of the temporal bones was performed without the administration of intravenous contrast. Multiplanar reformatted images are provided for review. Automated exposure control, iterative reconstruction, and/or weight based adjustment of the mA/kV was utilized to reduce the radiation dose to as low as reasonably achievable. COMPARISON: None available. CLINICAL HISTORY: Left ear pain s/p myringotomy 5 days ago. FINDINGS: RIGHT TEMPORAL BONE: EXTERNAL AUDITORY CANAL: Clear. No bony erosion. Scutum is intact. MIDDLE EAR CAVITY: Clear. Ossicular chain is intact. MASTOID AIR CELLS: Clear. INNER EAR: The cochlea and vestibule are unremarkable. Normal mineralization of the otic capsule. Normal semicircular canals. The vestibular aqueduct is not dilated. INTERNAL AUDITORY CANAL: Unremarkable. Normal bony canal of the facial nerve. LEFT  TEMPORAL BONE: EXTERNAL AUDITORY CANAL: Opacified. The tympanic membrane is not excludedly visualized. MIDDLE EAR CAVITY: Opacified. Ossicles are intact. No focal osseous erosions are present. Fluid or soft tissue extend into the epitympanum. MASTOID AIR CELLS: Effusion is present without coalescence. INNER EAR: The cochlea and vestibule are unremarkable. Normal mineralization of the otic capsule. Normal semicircular canals. The vestibular aqueduct is not dilated. INTERNAL AUDITORY CANAL: Unremarkable. Normal bony canal of the facial nerve. VASCULAR: Atherosclerotic calcifications are present in the cavernous carotid arteries bilaterally. No hyperdense vessel is present. BRAIN: Unremarkable. ORBITS: No acute abnormality. SINUSES: Right maxillary sinus is chronically opacified. Mild mucosal thickening is present within the anterior right ethmoid air cells. IMPRESSION: 1. Opacification of the left external auditory canal and middle ear cavity with extension into  the epitympanum. Effusion in the left mastoid air cells without coalescence. No osseous erosion. 2. Chronically opacified right maxillary sinus and mild mucosal thickening in the anterior right ethmoid air cells. Electronically signed by: Lonni Necessary MD 08/21/2024 04:19 PM EDT RP Workstation: [id number]   CT CHEST ABDOMEN PELVIS WO CONTRAST Result Date: 08/21/2024 CLINICAL DATA:  Epigastric pain EXAM: CT CHEST, ABDOMEN AND PELVIS WITHOUT CONTRAST TECHNIQUE: Multidetector CT imaging of the chest, abdomen and pelvis was performed following the standard protocol without IV contrast. RADIATION DOSE REDUCTION: This exam was performed according to the departmental dose-optimization program which includes automated exposure control, adjustment of the mA and/or kV according to patient size and/or use of iterative reconstruction technique. COMPARISON:  None Available. FINDINGS: CT CHEST FINDINGS Cardiovascular: Aortic atherosclerosis. Cardiomegaly. Extensive  three-vessel coronary artery calcifications and or stents. No pericardial effusion. Mediastinum/Nodes: No enlarged mediastinal, hilar, or axillary lymph nodes. Thyroid  gland, trachea, and esophagus demonstrate no significant findings. Lungs/Pleura: Dependent bibasilar scarring or atelectasis. No pleural effusion or pneumothorax. Musculoskeletal: No chest wall abnormality. No acute osseous findings. CT ABDOMEN PELVIS FINDINGS Hepatobiliary: No solid liver abnormality is seen. Tiny gallstones and/or gravel (series 3, image 66). No gallbladder wall thickening, or biliary dilatation. Pancreas: Unremarkable. No pancreatic ductal dilatation or surrounding inflammatory changes. Spleen: Normal in size without significant abnormality. Adrenals/Urinary Tract: Adrenal glands are unremarkable. Small nonobstructive bilateral renal calculi and or renal vascular calcifications. No ureteral calculi or hydronephrosis. Numerous phleboliths throughout the low pelvis, which are not favored to reflect urinary tract calculi. Bladder is unremarkable. Stomach/Bowel: Stomach is within normal limits. Appendix not clearly visualized. No evidence of bowel wall thickening, distention, or inflammatory changes. Sigmoid diverticulosis. Vascular/Lymphatic: No significant vascular findings are present. No enlarged abdominal or pelvic lymph nodes. Reproductive: No mass or other abnormality. Other: No abdominal wall hernia or abnormality. No ascites. Musculoskeletal: No acute osseous findings. IMPRESSION: 1. No acute noncontrast CT findings of the chest, abdomen, or pelvis to explain epigastric pain. 2. Tiny gallstones and/or gravel. No CT evidence of acute cholecystitis. 3. Small nonobstructive bilateral renal calculi and or renal vascular calcifications. No ureteral calculi or hydronephrosis. Numerous phleboliths throughout the pelvis which are not favored to reflect urinary tract calculi. 4. Sigmoid diverticulosis without evidence of acute  diverticulitis. 5. Cardiomegaly and coronary artery disease. Aortic Atherosclerosis (ICD10-I70.0). Electronically Signed   By: Marolyn JONETTA Jaksch M.D.   On: 08/21/2024 16:03   DG Knee 2 Views Right Result Date: 08/21/2024 EXAM: 1 or 2 VIEW(S) XRAY OF THE RIGHT KNEE 08/21/2024 12:57:00 PM COMPARISON: MRI from 06/29/2020. CLINICAL HISTORY: Knee pain. Reason for exam: hematemesis, knee pain; Per triage notes: Patient BIB [facility] from [facility] for hematemesis that may have started around 1030. Patient has dried blood on her face and dried blood in an emesis basin. FINDINGS: BONES AND JOINTS: Postsurgical changes of distal femur and proximal tibia status post removal of right knee arthroplasty device and subsequent placement of cement and intramedullary fixation rods. The fixation rod traversing the knee joint is fractured with posterior displacement of the distal fracture component. Lucency along the cement bone interface is noted. Heterotopic calcification anterior to the distal femur. SOFT TISSUES: Severe vascular calcifications. IMPRESSION: 1. Fractured intramedullary fixation rod traversing the knee joint with posterior displacement of the distal fracture component. 2. Lucency along the cement-bone interface, cannot exclude osteomyelitis. 3. Heterotopic calcification anterior to the distal femur. 4. Severe vascular calcifications. Electronically signed by: Waddell Calk MD 08/21/2024 01:22 PM EDT RP Workstation: [id number]  DG Chest Portable 1 View Result Date: 08/21/2024 EXAM: 1 VIEW XRAY OF THE CHEST 08/21/2024 12:57:00 PM COMPARISON: 08/14/2024 CLINICAL HISTORY: Hematemsis. Reason for exam: hematemesis, knee pain; Per triage notes: Patient BIB [facility] from [facility] for hematemesis that may have started around 1030. Patient has dried blood on her face and dried blood in an emesis basin. FINDINGS: LUNGS AND PLEURA: Retrocardiac opacity is identified obscuring the left hemidiaphragm this  may represent atelectasis or airspace disease. No pleural effusion. No pneumothorax. HEART AND MEDIASTINUM: Cardiomegaly. Atherosclerotic calcifications. BONES AND SOFT TISSUES: Multilevel degenerative changes of thoracic spine. No acute osseous abnormality. IMPRESSION: 1. Retrocardiac opacity obscuring the left hemidiaphragm, possibly representing atelectasis or airspace disease. 2. Cardiomegaly and atherosclerotic calcifications. Electronically signed by: Waddell Calk MD 08/21/2024 01:11 PM EDT RP Workstation: [id number]   CT TEMPORAL BONES WO CONTRAST Result Date: 08/17/2024 CLINICAL DATA:  78 year old female with left facial droop, left mastoid effusion on recent brain MRI without and with contrast. EXAM: CT TEMPORAL BONES WITHOUT CONTRAST TECHNIQUE: Axial and coronal plane CT imaging of the petrous temporal bones was performed with thin-collimation image reconstruction. No intravenous contrast was administered. Multiplanar CT image reconstructions were also generated. RADIATION DOSE REDUCTION: This exam was performed according to the departmental dose-optimization program which includes automated exposure control, adjustment of the mA and/or kV according to patient size and/or use of iterative reconstruction technique. COMPARISON:  Brain MRI 08/14/2024. FINDINGS: RIGHT TEMPORAL BONE Right external auditory canal is patent. Right tympanic membrane appears normal. Right tympanic cavity is clear. Right scutum and ossicles appear intact and aligned. Right mastoid antrum is clear. Right mastoid air cells well aerated. Right otic capsule bone mineralization appears normal. Right internal auditory canal, cochlea, vestibule, vestibular aqueduct, semicircular canals, and course of the right 7th nerve appear normal. LEFT TEMPORAL BONE Subtotal opacification of the left external auditory canal (series 10, image 115 and series 6, image 60. Subtotal opacification of the right tympanic cavity. Left tympanic membrane  obscured. Left mastoid antrum opacified. Mostly opacified left mastoid air cells, including air cells with fluid levels (series 6, image 40). No bony EAC erosion identified. Left scutum and ossicles appear to remain intact and aligned. No mastoid coalescence identified. Left IAC, cochlea, vestibule, vestibular aqueduct, and left semicircular canals appear normal. Left stylomastoid foramen appears normal by CT. Vascular: Calcified atherosclerosis at the skull base. Limited intracranial: Grossly stable compared to head CT last month. Visible orbits/paranasal sinuses: Orbits not included. Right maxillary sinus mucoperiosteal thickening again noted. Trace layering fluid in the left sphenoid sinus. Soft tissues: Visible noncontrast deep soft tissue spaces of the face appear negative. IMPRESSION: 1. Subtotal opacification of the left external auditory canal, left tympanic cavity, and left mastoid. No bone erosion is associated. Although nonspecific the constellation favors Infectious combined External Otitis, Otitis Media with reactive mastoid effusion. An EAC tumor (such as squamous cell carcinoma) could not be excluded. 2. Normal right temporal bone. 3. Chronic right maxillary sinusitis. Trace layering paranasal sinus fluid elsewhere. Electronically Signed   By: VEAR Hurst M.D.   On: 08/17/2024 12:36   VAS US  LOWER EXTREMITY VENOUS (DVT) Result Date: 08/16/2024  Lower Venous DVT Study Patient Name:  ALETHIA MELENDREZ  Date of Exam:   08/15/2024 Medical Rec #: [id number]     Accession #:    [id number] Date of Birth: [birth year]     Patient Gender: F Patient Age:   78 years Exam Location:  [facility] Procedure:  VAS US  LOWER EXTREMITY VENOUS (DVT) Referring Phys: MAXIMINO SHARPS --------------------------------------------------------------------------------  Indications: Pulmonary embolism.  Limitations: Body habitus and pt unable to tolerate compression. Performing Technologist: Elmarie Lindau, RVT  Examination  Guidelines: A complete evaluation includes B-mode imaging, spectral Doppler, color Doppler, and power Doppler as needed of all accessible portions of each vessel. Bilateral testing is considered an integral part of a complete examination. Limited examinations for reoccurring indications may be performed as noted. The reflux portion of the exam is performed with the patient in reverse Trendelenburg.  +---------+---------------+---------+-----------+----------+-------------------+ RIGHT    CompressibilityPhasicitySpontaneityPropertiesThrombus Aging      +---------+---------------+---------+-----------+----------+-------------------+ CFV      Full           Yes      Yes                                      +---------+---------------+---------+-----------+----------+-------------------+ SFJ      Full                                                             +---------+---------------+---------+-----------+----------+-------------------+ FV Prox  Full                                                             +---------+---------------+---------+-----------+----------+-------------------+ FV Mid                                                full color flow     +---------+---------------+---------+-----------+----------+-------------------+ FV Distal                                             Not well visualized +---------+---------------+---------+-----------+----------+-------------------+ PFV      Full                                                             +---------+---------------+---------+-----------+----------+-------------------+ POP                                                   Not well visualized +---------+---------------+---------+-----------+----------+-------------------+ PTV      Full                                                             +---------+---------------+---------+-----------+----------+-------------------+ PERO  Full                                                             +---------+---------------+---------+-----------+----------+-------------------+   +---------+---------------+---------+-----------+----------+---------------+ LEFT     CompressibilityPhasicitySpontaneityPropertiesThrombus Aging  +---------+---------------+---------+-----------+----------+---------------+ CFV      Full           Yes      Yes                                  +---------+---------------+---------+-----------+----------+---------------+ SFJ      Full                                                         +---------+---------------+---------+-----------+----------+---------------+ FV Prox  Full                                                         +---------+---------------+---------+-----------+----------+---------------+ FV Mid                                                full color flow +---------+---------------+---------+-----------+----------+---------------+ FV Distal                                             full color flow +---------+---------------+---------+-----------+----------+---------------+ PFV      Full                                                         +---------+---------------+---------+-----------+----------+---------------+ POP      Full           Yes      Yes                                  +---------+---------------+---------+-----------+----------+---------------+ PTV      Full                                                         +---------+---------------+---------+-----------+----------+---------------+ PERO     Full                                                         +---------+---------------+---------+-----------+----------+---------------+  Summary: RIGHT: - There is no evidence of deep vein thrombosis in the lower extremity. However, portions of this examination were limited- see technologist comments above.   LEFT: - There is no evidence of deep vein thrombosis in the lower extremity.  - No cystic structure found in the popliteal fossa.  *See table(s) above for measurements and observations. Electronically signed by Penne Colorado MD on 08/16/2024 at 3:35:39 PM.    Final    MR Brain W and Wo Contrast Result Date: 08/14/2024 CLINICAL DATA:  L facial droop EXAM: MRI HEAD WITHOUT AND WITH CONTRAST TECHNIQUE: Multiplanar, multiecho pulse sequences of the brain and surrounding structures were obtained without and with intravenous contrast. CONTRAST:  10mL GADAVIST  GADOBUTROL  1 MMOL/ML IV SOLN COMPARISON:  CT head 08/01/2024. FINDINGS: Brain: No acute infarction, acute hemorrhage, hydrocephalus, extra-axial collection or mass lesion. Numerous punctate foci of susceptibility artifact predominantly in the cerebellum, thalami and basal ganglia and to lesser extent scattered throughout the supratentorial brain, compatible with chronic microhemorrhages. Moderate T2/FLAIR hyperintensities the white matter are compatible with chronic microvascular ischemic disease. No abnormal enhancement. Vascular: Normal flow voids. Skull and upper cervical spine: Normal marrow signal. Sinuses/Orbits: Opacified right maxillary sinus. Other: Left mastoid effusion. IMPRESSION: 1. No evidence of acute intracranial abnormality. 2. Moderate chronic microvascular ischemic disease. 3. Numerous chronic microhemorrhages, likely due to chronic hypertension. 4. Left mastoid effusion. 5. Opacified right maxillary sinus. Electronically Signed   By: Gilmore GORMAN Molt M.D.   On: 08/14/2024 22:16   DG Chest Port 1 View Result Date: 08/14/2024 CLINICAL DATA:  Sepsis, cough EXAM: PORTABLE CHEST 1 VIEW COMPARISON:  08/06/2024 FINDINGS: Single frontal view of the chest demonstrates stable enlargement of the cardiac silhouette. No acute airspace disease, effusion, or pneumothorax. No acute bony abnormalities. IMPRESSION: 1. Stable enlarged cardiac silhouette.  No acute  airspace disease. Electronically Signed   By: Ozell Daring M.D.   On: 08/14/2024 21:13   NM Pulmonary Perfusion Result Date: 08/06/2024 EXAM: NM Lung Perfusion Scan. CLINICAL HISTORY: Pulmonary embolism (PE) suspected, high prob. TECHNIQUE: Radiolabeled 4.36 mCi Tc31m MAA (technetium albumin  aggregated injection solution) was administered intravenously via the right forearm. Planar images of the lungs were obtained in multiple projections. RADIOPHARMACEUTICAL: 4.36 millicurie technetium to 31m albumin  aggregated (MAA). COMPARISON: 08/06/2024 chest radiograph. FINDINGS: PERFUSION: Small vaguely wedge-shaped perfusion defect in the anterior right mid lung without radiographic correlate. Otherwise, no perfusion defects. IMPRESSION: 1. Small vaguely wedge-shaped perfusion defect in the anterior right mid lung without radiographic correlate; segmental pulmonary embolism cannot be excluded. Otherwise no perfusion defects. Electronically signed by: Selinda Blue MD 08/06/2024 04:47 PM EDT RP Workstation: [id number]   DG CHEST PORT 1 VIEW Result Date: 08/06/2024 EXAM: 1 VIEW XRAY OF THE CHEST 08/06/2024 03:07:00 PM COMPARISON: 08/01/2024 CLINICAL HISTORY: Abnormal perfusion scan of lung FINDINGS: LUNGS AND PLEURA: No overt pulmonary edema. Low lung volumes. No focal pulmonary opacity. No pleural effusion. No pneumothorax. HEART AND MEDIASTINUM: Cardiomegaly. Aortic atherosclerosis. BONES AND SOFT TISSUES: No acute osseous abnormality. IMPRESSION: 1. No acute findings. Low lung volumes. 2. Cardiomegaly without overt edema. Electronically signed by: Selinda Blue MD 08/06/2024 03:45 PM EDT RP Workstation: [id number]    Microbiology: No results found for this or any previous visit (from the past 240 hours).  Time spent: 35 minutes  Signed: Erle Odell Castor, MD September 20, 2024

## 2024-09-07 NOTE — Plan of Care (Signed)
  Problem: Clinical Measurements: Goal: Respiratory complications will improve Outcome: Not Progressing   

## 2024-09-07 DEATH — deceased

## 2024-09-08 ENCOUNTER — Other Ambulatory Visit (HOSPITAL_COMMUNITY): Payer: Self-pay

## 2024-09-20 ENCOUNTER — Institutional Professional Consult (permissible substitution) (INDEPENDENT_AMBULATORY_CARE_PROVIDER_SITE_OTHER)

## 2024-09-26 ENCOUNTER — Inpatient Hospital Stay: Admitting: Internal Medicine
